# Patient Record
Sex: Female | Born: 1940 | Race: Black or African American | Hispanic: No | Marital: Single | Smoking: Former smoker
Health system: Southern US, Community
[De-identification: ages and names within clinical notes are randomized; demographics above are authoritative.]

## PROBLEM LIST (undated history)

## (undated) DIAGNOSIS — T4145XA Adverse effect of unspecified anesthetic, initial encounter: Secondary | ICD-10-CM

## (undated) DIAGNOSIS — D631 Anemia in chronic kidney disease: Secondary | ICD-10-CM

## (undated) DIAGNOSIS — K222 Esophageal obstruction: Secondary | ICD-10-CM

## (undated) DIAGNOSIS — R51 Headache: Secondary | ICD-10-CM

## (undated) DIAGNOSIS — E079 Disorder of thyroid, unspecified: Secondary | ICD-10-CM

## (undated) DIAGNOSIS — Z9989 Dependence on other enabling machines and devices: Secondary | ICD-10-CM

## (undated) DIAGNOSIS — I272 Pulmonary hypertension, unspecified: Secondary | ICD-10-CM

## (undated) DIAGNOSIS — T8859XA Other complications of anesthesia, initial encounter: Secondary | ICD-10-CM

## (undated) DIAGNOSIS — K2210 Ulcer of esophagus without bleeding: Secondary | ICD-10-CM

## (undated) DIAGNOSIS — G7000 Myasthenia gravis without (acute) exacerbation: Secondary | ICD-10-CM

## (undated) DIAGNOSIS — K5790 Diverticulosis of intestine, part unspecified, without perforation or abscess without bleeding: Secondary | ICD-10-CM

## (undated) DIAGNOSIS — R011 Cardiac murmur, unspecified: Secondary | ICD-10-CM

## (undated) DIAGNOSIS — N183 Chronic kidney disease, stage 3 (moderate): Secondary | ICD-10-CM

## (undated) DIAGNOSIS — E785 Hyperlipidemia, unspecified: Secondary | ICD-10-CM

## (undated) DIAGNOSIS — J42 Unspecified chronic bronchitis: Secondary | ICD-10-CM

## (undated) DIAGNOSIS — K219 Gastro-esophageal reflux disease without esophagitis: Secondary | ICD-10-CM

## (undated) DIAGNOSIS — J449 Chronic obstructive pulmonary disease, unspecified: Secondary | ICD-10-CM

## (undated) DIAGNOSIS — M7540 Impingement syndrome of unspecified shoulder: Secondary | ICD-10-CM

## (undated) DIAGNOSIS — F329 Major depressive disorder, single episode, unspecified: Secondary | ICD-10-CM

## (undated) DIAGNOSIS — F419 Anxiety disorder, unspecified: Secondary | ICD-10-CM

## (undated) DIAGNOSIS — I10 Essential (primary) hypertension: Secondary | ICD-10-CM

## (undated) DIAGNOSIS — N189 Chronic kidney disease, unspecified: Secondary | ICD-10-CM

## (undated) DIAGNOSIS — R0602 Shortness of breath: Secondary | ICD-10-CM

## (undated) DIAGNOSIS — G4733 Obstructive sleep apnea (adult) (pediatric): Secondary | ICD-10-CM

## (undated) DIAGNOSIS — M47816 Spondylosis without myelopathy or radiculopathy, lumbar region: Secondary | ICD-10-CM

## (undated) DIAGNOSIS — E119 Type 2 diabetes mellitus without complications: Secondary | ICD-10-CM

## (undated) DIAGNOSIS — M1990 Unspecified osteoarthritis, unspecified site: Secondary | ICD-10-CM

## (undated) DIAGNOSIS — I340 Nonrheumatic mitral (valve) insufficiency: Secondary | ICD-10-CM

## (undated) DIAGNOSIS — E669 Obesity, unspecified: Secondary | ICD-10-CM

## (undated) DIAGNOSIS — J189 Pneumonia, unspecified organism: Secondary | ICD-10-CM

## (undated) DIAGNOSIS — F32A Depression, unspecified: Secondary | ICD-10-CM

## (undated) DIAGNOSIS — G5600 Carpal tunnel syndrome, unspecified upper limb: Secondary | ICD-10-CM

## (undated) DIAGNOSIS — J302 Other seasonal allergic rhinitis: Secondary | ICD-10-CM

## (undated) DIAGNOSIS — D509 Iron deficiency anemia, unspecified: Secondary | ICD-10-CM

## (undated) DIAGNOSIS — M25519 Pain in unspecified shoulder: Secondary | ICD-10-CM

## (undated) DIAGNOSIS — Z720 Tobacco use: Secondary | ICD-10-CM

## (undated) HISTORY — DX: Myasthenia gravis without (acute) exacerbation: G70.00

## (undated) HISTORY — DX: Ulcer of esophagus without bleeding: K22.10

## (undated) HISTORY — DX: Gastro-esophageal reflux disease without esophagitis: K21.9

## (undated) HISTORY — DX: Chronic kidney disease, unspecified: N18.9

## (undated) HISTORY — PX: EYE SURGERY: SHX253

## (undated) HISTORY — DX: Obesity, unspecified: E66.9

## (undated) HISTORY — DX: Tobacco use: Z72.0

## (undated) HISTORY — DX: Unspecified osteoarthritis, unspecified site: M19.90

## (undated) HISTORY — DX: Essential (primary) hypertension: I10

## (undated) HISTORY — PX: TONSILLECTOMY: SUR1361

## (undated) HISTORY — PX: ESOPHAGEAL DILATION: SHX303

## (undated) HISTORY — DX: Iron deficiency anemia, unspecified: D50.9

## (undated) HISTORY — DX: Pain in unspecified shoulder: M25.519

## (undated) HISTORY — PX: ABDOMINAL HYSTERECTOMY: SHX81

## (undated) HISTORY — DX: Impingement syndrome of unspecified shoulder: M75.40

## (undated) HISTORY — DX: Major depressive disorder, single episode, unspecified: F32.9

## (undated) HISTORY — DX: Esophageal obstruction: K22.2

## (undated) HISTORY — DX: Depression, unspecified: F32.A

## (undated) HISTORY — DX: Chronic kidney disease, stage 3 (moderate): N18.3

## (undated) HISTORY — DX: Hyperlipidemia, unspecified: E78.5

## (undated) HISTORY — DX: Disorder of thyroid, unspecified: E07.9

## (undated) HISTORY — DX: Spondylosis without myelopathy or radiculopathy, lumbar region: M47.816

## (undated) HISTORY — DX: Anemia in chronic kidney disease: D63.1

## (undated) HISTORY — DX: Nonrheumatic mitral (valve) insufficiency: I34.0

## (undated) HISTORY — PX: CARPAL TUNNEL RELEASE: SHX101

## (undated) HISTORY — DX: Pulmonary hypertension, unspecified: I27.20

## (undated) HISTORY — PX: JOINT REPLACEMENT: SHX530

## (undated) HISTORY — DX: Carpal tunnel syndrome, unspecified upper limb: G56.00

## (undated) HISTORY — PX: CHOLECYSTECTOMY: SHX55

## (undated) HISTORY — PX: APPENDECTOMY: SHX54

## (undated) HISTORY — DX: Diverticulosis of intestine, part unspecified, without perforation or abscess without bleeding: K57.90

---

## 2001-07-29 ENCOUNTER — Encounter: Payer: Self-pay | Admitting: Unknown Physician Specialty

## 2001-07-29 ENCOUNTER — Ambulatory Visit (HOSPITAL_COMMUNITY): Admission: RE | Admit: 2001-07-29 | Discharge: 2001-07-29 | Payer: Self-pay | Admitting: Unknown Physician Specialty

## 2002-03-19 ENCOUNTER — Ambulatory Visit (HOSPITAL_COMMUNITY): Admission: RE | Admit: 2002-03-19 | Discharge: 2002-03-19 | Payer: Self-pay | Admitting: Unknown Physician Specialty

## 2003-09-14 ENCOUNTER — Ambulatory Visit (HOSPITAL_COMMUNITY): Admission: RE | Admit: 2003-09-14 | Discharge: 2003-09-14 | Payer: Self-pay | Admitting: Internal Medicine

## 2003-09-14 ENCOUNTER — Encounter: Payer: Self-pay | Admitting: Internal Medicine

## 2004-04-11 ENCOUNTER — Ambulatory Visit (HOSPITAL_COMMUNITY): Admission: RE | Admit: 2004-04-11 | Discharge: 2004-04-11 | Payer: Self-pay | Admitting: Family Medicine

## 2004-07-31 DIAGNOSIS — K5790 Diverticulosis of intestine, part unspecified, without perforation or abscess without bleeding: Secondary | ICD-10-CM

## 2004-07-31 HISTORY — DX: Diverticulosis of intestine, part unspecified, without perforation or abscess without bleeding: K57.90

## 2004-08-30 ENCOUNTER — Ambulatory Visit (HOSPITAL_COMMUNITY): Admission: RE | Admit: 2004-08-30 | Discharge: 2004-08-30 | Payer: Self-pay | Admitting: Internal Medicine

## 2004-11-03 ENCOUNTER — Ambulatory Visit: Payer: Self-pay | Admitting: *Deleted

## 2004-11-08 ENCOUNTER — Ambulatory Visit: Payer: Self-pay | Admitting: Cardiology

## 2004-11-09 ENCOUNTER — Encounter (HOSPITAL_COMMUNITY): Admission: RE | Admit: 2004-11-09 | Discharge: 2004-12-09 | Payer: Self-pay | Admitting: *Deleted

## 2004-11-09 ENCOUNTER — Ambulatory Visit: Payer: Self-pay | Admitting: *Deleted

## 2004-11-15 ENCOUNTER — Ambulatory Visit: Payer: Self-pay | Admitting: Family Medicine

## 2004-11-15 ENCOUNTER — Ambulatory Visit: Payer: Self-pay | Admitting: *Deleted

## 2004-12-06 ENCOUNTER — Ambulatory Visit: Payer: Self-pay | Admitting: Family Medicine

## 2005-01-08 ENCOUNTER — Ambulatory Visit: Payer: Self-pay | Admitting: Orthopedic Surgery

## 2005-01-24 ENCOUNTER — Ambulatory Visit: Payer: Self-pay | Admitting: Family Medicine

## 2005-02-19 ENCOUNTER — Ambulatory Visit: Payer: Self-pay | Admitting: Orthopedic Surgery

## 2005-02-22 ENCOUNTER — Ambulatory Visit (HOSPITAL_COMMUNITY): Admission: RE | Admit: 2005-02-22 | Discharge: 2005-02-22 | Payer: Self-pay | Admitting: Orthopedic Surgery

## 2005-02-22 ENCOUNTER — Encounter: Payer: Self-pay | Admitting: Orthopedic Surgery

## 2005-02-28 ENCOUNTER — Ambulatory Visit: Payer: Self-pay | Admitting: Orthopedic Surgery

## 2005-04-24 ENCOUNTER — Ambulatory Visit: Payer: Self-pay | Admitting: Family Medicine

## 2005-05-09 ENCOUNTER — Encounter: Admission: RE | Admit: 2005-05-09 | Discharge: 2005-05-09 | Payer: Self-pay | Admitting: Orthopedic Surgery

## 2005-05-22 ENCOUNTER — Ambulatory Visit: Payer: Self-pay | Admitting: Family Medicine

## 2005-05-30 ENCOUNTER — Ambulatory Visit: Payer: Self-pay | Admitting: Family Medicine

## 2005-05-31 ENCOUNTER — Encounter: Admission: RE | Admit: 2005-05-31 | Discharge: 2005-05-31 | Payer: Self-pay | Admitting: Orthopedic Surgery

## 2005-06-27 ENCOUNTER — Ambulatory Visit: Payer: Self-pay | Admitting: Family Medicine

## 2005-07-30 ENCOUNTER — Ambulatory Visit: Payer: Self-pay | Admitting: Family Medicine

## 2005-08-08 ENCOUNTER — Other Ambulatory Visit: Admission: RE | Admit: 2005-08-08 | Discharge: 2005-08-08 | Payer: Self-pay | Admitting: Obstetrics and Gynecology

## 2005-11-26 ENCOUNTER — Ambulatory Visit: Payer: Self-pay | Admitting: Family Medicine

## 2005-11-27 ENCOUNTER — Encounter (HOSPITAL_COMMUNITY): Admission: RE | Admit: 2005-11-27 | Discharge: 2005-12-27 | Payer: Self-pay | Admitting: *Deleted

## 2005-11-27 ENCOUNTER — Ambulatory Visit (HOSPITAL_COMMUNITY): Admission: RE | Admit: 2005-11-27 | Discharge: 2005-11-27 | Payer: Self-pay | Admitting: Family Medicine

## 2005-12-20 ENCOUNTER — Ambulatory Visit: Payer: Self-pay | Admitting: Family Medicine

## 2006-01-01 ENCOUNTER — Encounter (HOSPITAL_COMMUNITY): Admission: RE | Admit: 2006-01-01 | Discharge: 2006-01-31 | Payer: Self-pay | Admitting: Family Medicine

## 2006-01-07 ENCOUNTER — Ambulatory Visit: Payer: Self-pay | Admitting: Family Medicine

## 2006-02-20 ENCOUNTER — Ambulatory Visit: Payer: Self-pay | Admitting: Family Medicine

## 2006-04-03 ENCOUNTER — Ambulatory Visit: Payer: Self-pay | Admitting: Family Medicine

## 2006-04-26 ENCOUNTER — Ambulatory Visit: Payer: Self-pay | Admitting: Family Medicine

## 2006-05-02 ENCOUNTER — Ambulatory Visit: Payer: Self-pay | Admitting: Family Medicine

## 2006-05-09 ENCOUNTER — Ambulatory Visit: Payer: Self-pay | Admitting: *Deleted

## 2006-05-14 ENCOUNTER — Encounter: Payer: Self-pay | Admitting: Vascular Surgery

## 2006-05-14 ENCOUNTER — Ambulatory Visit (HOSPITAL_COMMUNITY): Admission: RE | Admit: 2006-05-14 | Discharge: 2006-05-14 | Payer: Self-pay | Admitting: Ophthalmology

## 2006-06-10 ENCOUNTER — Ambulatory Visit: Payer: Self-pay | Admitting: Family Medicine

## 2006-08-01 ENCOUNTER — Ambulatory Visit: Payer: Self-pay | Admitting: Family Medicine

## 2006-10-01 ENCOUNTER — Ambulatory Visit: Payer: Self-pay | Admitting: Family Medicine

## 2006-10-21 ENCOUNTER — Ambulatory Visit: Payer: Self-pay | Admitting: Internal Medicine

## 2006-10-31 ENCOUNTER — Ambulatory Visit: Payer: Self-pay | Admitting: Internal Medicine

## 2006-10-31 ENCOUNTER — Ambulatory Visit (HOSPITAL_COMMUNITY): Admission: RE | Admit: 2006-10-31 | Discharge: 2006-10-31 | Payer: Self-pay | Admitting: Internal Medicine

## 2006-11-05 ENCOUNTER — Ambulatory Visit: Payer: Self-pay | Admitting: Family Medicine

## 2006-12-10 ENCOUNTER — Ambulatory Visit (HOSPITAL_COMMUNITY): Admission: RE | Admit: 2006-12-10 | Discharge: 2006-12-10 | Payer: Self-pay | Admitting: Family Medicine

## 2006-12-12 ENCOUNTER — Ambulatory Visit: Payer: Self-pay | Admitting: Internal Medicine

## 2006-12-16 ENCOUNTER — Encounter (HOSPITAL_COMMUNITY): Admission: RE | Admit: 2006-12-16 | Discharge: 2006-12-30 | Payer: Self-pay | Admitting: Internal Medicine

## 2007-01-07 ENCOUNTER — Ambulatory Visit: Payer: Self-pay | Admitting: Family Medicine

## 2007-01-08 ENCOUNTER — Encounter: Payer: Self-pay | Admitting: Orthopedic Surgery

## 2007-01-15 ENCOUNTER — Ambulatory Visit: Payer: Self-pay | Admitting: Family Medicine

## 2007-02-28 ENCOUNTER — Encounter: Payer: Self-pay | Admitting: Family Medicine

## 2007-02-28 LAB — CONVERTED CEMR LAB
Eosinophils Absolute: 0.2 10*3/uL (ref 0.0–0.7)
Ferritin: 7 ng/mL — ABNORMAL LOW (ref 10–291)
Folate: 9.6 ng/mL
Hgb A1c MFr Bld: 7.5 % — ABNORMAL HIGH (ref 4.6–6.1)
Lymphocytes Relative: 29 % (ref 12–46)
Lymphs Abs: 3 10*3/uL (ref 0.7–3.3)
MCV: 91.6 fL (ref 78.0–100.0)
Microalb, Ur: 0.92 mg/dL (ref 0.00–1.89)
Neutro Abs: 6.5 10*3/uL (ref 1.7–7.7)
Neutrophils Relative %: 63 % (ref 43–77)
Platelets: 378 10*3/uL (ref 150–400)
Saturation Ratios: 10 % — ABNORMAL LOW (ref 20–55)
TSH: 2.471 microintl units/mL (ref 0.350–5.50)
Vitamin B-12: 544 pg/mL (ref 211–911)
WBC: 10.4 10*3/uL (ref 4.0–10.5)

## 2007-03-07 ENCOUNTER — Emergency Department (HOSPITAL_COMMUNITY): Admission: EM | Admit: 2007-03-07 | Discharge: 2007-03-07 | Payer: Self-pay | Admitting: Emergency Medicine

## 2007-03-26 ENCOUNTER — Ambulatory Visit: Payer: Self-pay | Admitting: Family Medicine

## 2007-03-26 ENCOUNTER — Other Ambulatory Visit: Admission: RE | Admit: 2007-03-26 | Discharge: 2007-03-26 | Payer: Self-pay | Admitting: Family Medicine

## 2007-03-26 ENCOUNTER — Encounter (INDEPENDENT_AMBULATORY_CARE_PROVIDER_SITE_OTHER): Payer: Self-pay | Admitting: *Deleted

## 2007-04-07 ENCOUNTER — Ambulatory Visit: Payer: Self-pay | Admitting: Orthopedic Surgery

## 2007-04-09 ENCOUNTER — Ambulatory Visit (HOSPITAL_COMMUNITY): Admission: RE | Admit: 2007-04-09 | Discharge: 2007-04-09 | Payer: Self-pay | Admitting: Orthopedic Surgery

## 2007-04-16 ENCOUNTER — Ambulatory Visit: Payer: Self-pay | Admitting: Orthopedic Surgery

## 2007-05-06 ENCOUNTER — Ambulatory Visit: Payer: Self-pay | Admitting: Orthopedic Surgery

## 2007-05-07 ENCOUNTER — Ambulatory Visit: Payer: Self-pay | Admitting: Family Medicine

## 2007-05-13 ENCOUNTER — Encounter (INDEPENDENT_AMBULATORY_CARE_PROVIDER_SITE_OTHER): Payer: Self-pay | Admitting: Specialist

## 2007-05-13 ENCOUNTER — Ambulatory Visit: Payer: Self-pay | Admitting: Orthopedic Surgery

## 2007-05-13 ENCOUNTER — Inpatient Hospital Stay (HOSPITAL_COMMUNITY): Admission: RE | Admit: 2007-05-13 | Discharge: 2007-05-17 | Payer: Self-pay | Admitting: Orthopedic Surgery

## 2007-05-13 HISTORY — PX: TOTAL KNEE ARTHROPLASTY: SHX125

## 2007-05-27 ENCOUNTER — Ambulatory Visit: Payer: Self-pay | Admitting: Orthopedic Surgery

## 2007-05-30 ENCOUNTER — Ambulatory Visit (HOSPITAL_COMMUNITY): Admission: RE | Admit: 2007-05-30 | Discharge: 2007-05-30 | Payer: Self-pay | Admitting: Orthopaedic Surgery

## 2007-06-11 ENCOUNTER — Ambulatory Visit: Payer: Self-pay | Admitting: Orthopedic Surgery

## 2007-06-18 ENCOUNTER — Encounter (HOSPITAL_COMMUNITY): Admission: RE | Admit: 2007-06-18 | Discharge: 2007-07-18 | Payer: Self-pay | Admitting: Orthopedic Surgery

## 2007-06-24 ENCOUNTER — Ambulatory Visit: Payer: Self-pay | Admitting: Family Medicine

## 2007-07-03 ENCOUNTER — Ambulatory Visit: Payer: Self-pay | Admitting: Orthopedic Surgery

## 2007-07-03 ENCOUNTER — Encounter: Payer: Self-pay | Admitting: Family Medicine

## 2007-07-03 LAB — CONVERTED CEMR LAB
ALT: 13 units/L (ref 0–35)
AST: 16 units/L (ref 0–37)
Albumin: 4 g/dL (ref 3.5–5.2)
Cholesterol: 166 mg/dL (ref 0–200)
Eosinophils Relative: 3 % (ref 0–5)
Ferritin: 23 ng/mL (ref 10–291)
HCT: 31.3 % — ABNORMAL LOW (ref 36.0–46.0)
HDL: 54 mg/dL (ref >39)
Lymphocytes Relative: 34 % (ref 12–46)
Lymphs Abs: 3.3 10*3/uL (ref 0.7–3.3)
Neutrophils Relative %: 54 % (ref 43–77)
Platelets: 444 10*3/uL — ABNORMAL HIGH (ref 150–400)
Retic Count, Absolute: 67 (ref 19.0–186.0)
Saturation Ratios: 10 % — ABNORMAL LOW (ref 20–55)
Sed Rate: 16 mm/hr (ref 0–22)
TSH: 7.81 microintl units/mL — ABNORMAL HIGH (ref 0.350–5.50)
Total CHOL/HDL Ratio: 3.1
Total Protein: 6.9 g/dL (ref 6.0–8.3)
WBC: 9.7 10*3/uL (ref 4.0–10.5)

## 2007-07-07 ENCOUNTER — Encounter: Payer: Self-pay | Admitting: Family Medicine

## 2007-07-10 ENCOUNTER — Ambulatory Visit: Payer: Self-pay | Admitting: Orthopedic Surgery

## 2007-07-22 ENCOUNTER — Encounter (HOSPITAL_COMMUNITY): Admission: RE | Admit: 2007-07-22 | Discharge: 2007-08-21 | Payer: Self-pay | Admitting: Orthopedic Surgery

## 2007-08-21 ENCOUNTER — Ambulatory Visit: Payer: Self-pay | Admitting: Orthopedic Surgery

## 2007-09-04 ENCOUNTER — Encounter: Admission: RE | Admit: 2007-09-04 | Discharge: 2007-09-04 | Payer: Self-pay | Admitting: Orthopedic Surgery

## 2007-09-09 DIAGNOSIS — Z794 Long term (current) use of insulin: Secondary | ICD-10-CM

## 2007-09-09 DIAGNOSIS — E119 Type 2 diabetes mellitus without complications: Secondary | ICD-10-CM

## 2007-09-16 ENCOUNTER — Encounter: Admission: RE | Admit: 2007-09-16 | Discharge: 2007-09-16 | Payer: Self-pay | Admitting: Orthopedic Surgery

## 2007-09-24 ENCOUNTER — Ambulatory Visit: Payer: Self-pay | Admitting: Family Medicine

## 2007-10-07 ENCOUNTER — Encounter: Payer: Self-pay | Admitting: Family Medicine

## 2007-10-07 LAB — CONVERTED CEMR LAB
Basophils Absolute: 0 10*3/uL (ref 0.0–0.1)
Basophils Relative: 0 % (ref 0–1)
Bilirubin, Direct: 0.1 mg/dL (ref 0.0–0.3)
Chloride: 111 meq/L (ref 96–112)
Cholesterol: 171 mg/dL (ref 0–200)
Creatinine, Ser: 0.83 mg/dL (ref 0.40–1.20)
Folate: 5.6 ng/mL
HDL: 67 mg/dL (ref >39)
Hemoglobin: 9.8 g/dL — ABNORMAL LOW (ref 12.0–15.0)
Indirect Bilirubin: 0.2 mg/dL (ref 0.0–0.9)
MCHC: 30.3 g/dL (ref 30.0–36.0)
Monocytes Absolute: 0.7 10*3/uL (ref 0.2–0.7)
Neutro Abs: 7 10*3/uL (ref 1.7–7.7)
Neutrophils Relative %: 70 % (ref 43–77)
RDW: 17 % — ABNORMAL HIGH (ref 11.5–14.0)
TIBC: 337 ug/dL (ref 250–470)
Total Bilirubin: 0.3 mg/dL (ref 0.3–1.2)
Triglycerides: 110 mg/dL (ref <150)
UIBC: 315 ug/dL
VLDL: 22 mg/dL (ref 0–40)
Vitamin B-12: 317 pg/mL (ref 211–911)

## 2007-11-05 ENCOUNTER — Ambulatory Visit: Payer: Self-pay | Admitting: Family Medicine

## 2007-11-24 ENCOUNTER — Ambulatory Visit: Payer: Self-pay | Admitting: Orthopedic Surgery

## 2007-11-24 DIAGNOSIS — M1710 Unilateral primary osteoarthritis, unspecified knee: Secondary | ICD-10-CM

## 2007-11-24 DIAGNOSIS — IMO0002 Reserved for concepts with insufficient information to code with codable children: Secondary | ICD-10-CM | POA: Insufficient documentation

## 2007-12-23 ENCOUNTER — Ambulatory Visit (HOSPITAL_COMMUNITY): Admission: RE | Admit: 2007-12-23 | Discharge: 2007-12-23 | Payer: Self-pay | Admitting: Family Medicine

## 2007-12-29 ENCOUNTER — Ambulatory Visit: Payer: Self-pay | Admitting: Family Medicine

## 2007-12-29 LAB — CONVERTED CEMR LAB
Free T4: 1.13 ng/dL (ref 0.89–1.80)
TSH: 4.064 microintl units/mL (ref 0.350–5.50)

## 2008-01-01 DIAGNOSIS — K2210 Ulcer of esophagus without bleeding: Secondary | ICD-10-CM

## 2008-01-01 HISTORY — DX: Ulcer of esophagus without bleeding: K22.10

## 2008-02-02 ENCOUNTER — Ambulatory Visit: Payer: Self-pay | Admitting: Orthopedic Surgery

## 2008-02-02 DIAGNOSIS — M25519 Pain in unspecified shoulder: Secondary | ICD-10-CM

## 2008-02-02 DIAGNOSIS — M7580 Other shoulder lesions, unspecified shoulder: Secondary | ICD-10-CM

## 2008-02-10 ENCOUNTER — Encounter: Payer: Self-pay | Admitting: Family Medicine

## 2008-03-17 ENCOUNTER — Ambulatory Visit: Payer: Self-pay | Admitting: Orthopedic Surgery

## 2008-03-29 ENCOUNTER — Ambulatory Visit: Payer: Self-pay | Admitting: Family Medicine

## 2008-03-29 ENCOUNTER — Encounter: Payer: Self-pay | Admitting: Orthopedic Surgery

## 2008-04-01 ENCOUNTER — Encounter: Payer: Self-pay | Admitting: Family Medicine

## 2008-04-01 LAB — CONVERTED CEMR LAB: Retic Ct Pct: 1.5 % (ref 0.4–3.1)

## 2008-04-07 ENCOUNTER — Ambulatory Visit: Payer: Self-pay | Admitting: Family Medicine

## 2008-04-08 ENCOUNTER — Encounter: Payer: Self-pay | Admitting: Family Medicine

## 2008-04-14 ENCOUNTER — Ambulatory Visit: Payer: Self-pay | Admitting: Orthopedic Surgery

## 2008-05-19 ENCOUNTER — Ambulatory Visit: Payer: Self-pay | Admitting: Orthopedic Surgery

## 2008-05-19 DIAGNOSIS — Z96659 Presence of unspecified artificial knee joint: Secondary | ICD-10-CM

## 2008-05-19 DIAGNOSIS — G5600 Carpal tunnel syndrome, unspecified upper limb: Secondary | ICD-10-CM

## 2008-06-22 ENCOUNTER — Ambulatory Visit: Payer: Self-pay | Admitting: Family Medicine

## 2008-06-22 LAB — CONVERTED CEMR LAB
BUN: 22 mg/dL (ref 6–23)
CO2: 30 meq/L (ref 19–32)
Chloride: 98 meq/L (ref 96–112)
Glucose, Bld: 94 mg/dL (ref 70–99)
Potassium: 4.1 meq/L (ref 3.5–5.3)
Sodium: 140 meq/L (ref 135–145)

## 2008-06-24 ENCOUNTER — Ambulatory Visit: Payer: Self-pay | Admitting: Orthopedic Surgery

## 2008-06-30 ENCOUNTER — Encounter: Payer: Self-pay | Admitting: Family Medicine

## 2008-06-30 DIAGNOSIS — G4733 Obstructive sleep apnea (adult) (pediatric): Secondary | ICD-10-CM

## 2008-06-30 DIAGNOSIS — E785 Hyperlipidemia, unspecified: Secondary | ICD-10-CM

## 2008-06-30 DIAGNOSIS — F418 Other specified anxiety disorders: Secondary | ICD-10-CM

## 2008-06-30 DIAGNOSIS — K219 Gastro-esophageal reflux disease without esophagitis: Secondary | ICD-10-CM

## 2008-06-30 DIAGNOSIS — R609 Edema, unspecified: Secondary | ICD-10-CM | POA: Insufficient documentation

## 2008-07-29 ENCOUNTER — Telehealth: Payer: Self-pay | Admitting: Family Medicine

## 2008-08-02 ENCOUNTER — Encounter: Payer: Self-pay | Admitting: Family Medicine

## 2008-08-02 ENCOUNTER — Ambulatory Visit: Payer: Self-pay | Admitting: Family Medicine

## 2008-08-02 LAB — CONVERTED CEMR LAB: Hgb A1c MFr Bld: 9 %

## 2008-08-03 ENCOUNTER — Ambulatory Visit: Payer: Self-pay | Admitting: Family Medicine

## 2008-08-03 ENCOUNTER — Telehealth: Payer: Self-pay | Admitting: Family Medicine

## 2008-08-04 ENCOUNTER — Ambulatory Visit: Payer: Self-pay | Admitting: Family Medicine

## 2008-08-06 ENCOUNTER — Encounter: Payer: Self-pay | Admitting: Family Medicine

## 2008-08-06 ENCOUNTER — Ambulatory Visit (HOSPITAL_COMMUNITY): Admission: RE | Admit: 2008-08-06 | Discharge: 2008-08-06 | Payer: Self-pay | Admitting: Family Medicine

## 2008-08-06 ENCOUNTER — Ambulatory Visit: Payer: Self-pay | Admitting: Internal Medicine

## 2008-08-09 ENCOUNTER — Encounter: Payer: Self-pay | Admitting: Family Medicine

## 2008-08-12 ENCOUNTER — Telehealth: Payer: Self-pay | Admitting: Orthopedic Surgery

## 2008-08-12 ENCOUNTER — Ambulatory Visit: Payer: Self-pay | Admitting: Family Medicine

## 2008-08-12 LAB — CONVERTED CEMR LAB
Chloride: 104 meq/L (ref 96–112)
Creatinine, Ser: 1.09 mg/dL (ref 0.40–1.20)
Glucose, Bld: 117 mg/dL
Potassium: 4.4 meq/L (ref 3.5–5.3)
Sodium: 142 meq/L (ref 135–145)

## 2008-08-17 ENCOUNTER — Encounter: Payer: Self-pay | Admitting: Family Medicine

## 2008-08-31 ENCOUNTER — Encounter: Payer: Self-pay | Admitting: Family Medicine

## 2008-08-31 ENCOUNTER — Ambulatory Visit: Payer: Self-pay | Admitting: Cardiology

## 2008-08-31 ENCOUNTER — Ambulatory Visit (HOSPITAL_COMMUNITY): Admission: RE | Admit: 2008-08-31 | Discharge: 2008-08-31 | Payer: Self-pay | Admitting: Cardiology

## 2008-09-09 ENCOUNTER — Telehealth: Payer: Self-pay | Admitting: Family Medicine

## 2008-09-17 ENCOUNTER — Encounter: Payer: Self-pay | Admitting: Family Medicine

## 2008-09-21 ENCOUNTER — Encounter (INDEPENDENT_AMBULATORY_CARE_PROVIDER_SITE_OTHER): Payer: Self-pay | Admitting: *Deleted

## 2008-09-21 LAB — CONVERTED CEMR LAB
ALT: 15 units/L
AST: 15 units/L
Albumin: 4.1 g/dL
BUN: 27 mg/dL
CO2: 27 meq/L
Glucose, Bld: 62 mg/dL
Potassium: 4.3 meq/L
TSH: 3.49 microintl units/mL
Total Protein: 7.3 g/dL
Triglycerides: 102 mg/dL

## 2008-09-27 ENCOUNTER — Ambulatory Visit: Payer: Self-pay | Admitting: Family Medicine

## 2008-09-27 DIAGNOSIS — E039 Hypothyroidism, unspecified: Secondary | ICD-10-CM

## 2008-10-02 DIAGNOSIS — D509 Iron deficiency anemia, unspecified: Secondary | ICD-10-CM

## 2008-10-07 ENCOUNTER — Encounter: Payer: Self-pay | Admitting: Family Medicine

## 2008-10-08 ENCOUNTER — Encounter: Payer: Self-pay | Admitting: Family Medicine

## 2008-10-14 ENCOUNTER — Ambulatory Visit: Payer: Self-pay | Admitting: Internal Medicine

## 2008-10-26 ENCOUNTER — Ambulatory Visit (HOSPITAL_COMMUNITY): Admission: RE | Admit: 2008-10-26 | Discharge: 2008-10-26 | Payer: Self-pay | Admitting: Internal Medicine

## 2008-10-26 ENCOUNTER — Ambulatory Visit: Payer: Self-pay | Admitting: Internal Medicine

## 2008-11-01 ENCOUNTER — Encounter: Payer: Self-pay | Admitting: Family Medicine

## 2008-11-04 ENCOUNTER — Encounter: Payer: Self-pay | Admitting: Family Medicine

## 2008-11-05 ENCOUNTER — Encounter: Payer: Self-pay | Admitting: Family Medicine

## 2008-11-17 ENCOUNTER — Encounter: Payer: Self-pay | Admitting: Family Medicine

## 2008-11-24 ENCOUNTER — Ambulatory Visit: Payer: Self-pay | Admitting: Orthopedic Surgery

## 2008-11-24 DIAGNOSIS — M25569 Pain in unspecified knee: Secondary | ICD-10-CM

## 2008-12-03 ENCOUNTER — Encounter: Payer: Self-pay | Admitting: Family Medicine

## 2008-12-06 ENCOUNTER — Ambulatory Visit: Payer: Self-pay | Admitting: Family Medicine

## 2008-12-06 LAB — CONVERTED CEMR LAB
Glucose, Bld: 76 mg/dL
Hgb A1c MFr Bld: 6.6 %

## 2008-12-21 ENCOUNTER — Ambulatory Visit (HOSPITAL_COMMUNITY): Admission: RE | Admit: 2008-12-21 | Discharge: 2008-12-21 | Payer: Self-pay | Admitting: Family Medicine

## 2009-01-04 ENCOUNTER — Encounter: Payer: Self-pay | Admitting: Family Medicine

## 2009-01-10 ENCOUNTER — Encounter: Payer: Self-pay | Admitting: Family Medicine

## 2009-01-24 ENCOUNTER — Ambulatory Visit: Payer: Self-pay | Admitting: Orthopedic Surgery

## 2009-01-24 DIAGNOSIS — D492 Neoplasm of unspecified behavior of bone, soft tissue, and skin: Secondary | ICD-10-CM

## 2009-01-27 ENCOUNTER — Ambulatory Visit: Payer: Self-pay | Admitting: Orthopedic Surgery

## 2009-01-28 ENCOUNTER — Encounter: Payer: Self-pay | Admitting: Family Medicine

## 2009-01-31 ENCOUNTER — Telehealth: Payer: Self-pay | Admitting: Orthopedic Surgery

## 2009-02-01 ENCOUNTER — Ambulatory Visit: Payer: Self-pay | Admitting: Orthopedic Surgery

## 2009-02-01 ENCOUNTER — Encounter: Payer: Self-pay | Admitting: Family Medicine

## 2009-02-01 ENCOUNTER — Ambulatory Visit (HOSPITAL_COMMUNITY): Admission: RE | Admit: 2009-02-01 | Discharge: 2009-02-01 | Payer: Self-pay | Admitting: Orthopedic Surgery

## 2009-02-01 ENCOUNTER — Encounter: Payer: Self-pay | Admitting: Orthopedic Surgery

## 2009-02-03 ENCOUNTER — Ambulatory Visit: Payer: Self-pay | Admitting: Family Medicine

## 2009-02-03 ENCOUNTER — Ambulatory Visit: Payer: Self-pay | Admitting: Orthopedic Surgery

## 2009-02-03 LAB — CONVERTED CEMR LAB: Glucose, Bld: 97 mg/dL

## 2009-02-07 ENCOUNTER — Encounter: Payer: Self-pay | Admitting: Family Medicine

## 2009-02-07 LAB — CONVERTED CEMR LAB
ALT: 15 units/L (ref 0–35)
AST: 11 units/L (ref 0–37)
Alkaline Phosphatase: 111 units/L (ref 39–117)
Bilirubin, Direct: 0.1 mg/dL (ref 0.0–0.3)
Cholesterol: 163 mg/dL (ref 0–200)
HDL: 72 mg/dL (ref >39)
Total Bilirubin: 0.2 mg/dL — ABNORMAL LOW (ref 0.3–1.2)

## 2009-02-08 ENCOUNTER — Encounter: Payer: Self-pay | Admitting: Family Medicine

## 2009-02-08 LAB — CONVERTED CEMR LAB: OCCULT 3: NEGATIVE

## 2009-02-09 ENCOUNTER — Encounter: Payer: Self-pay | Admitting: Family Medicine

## 2009-02-09 LAB — CONVERTED CEMR LAB: OCCULT 2: NEGATIVE

## 2009-02-15 ENCOUNTER — Ambulatory Visit: Payer: Self-pay | Admitting: Orthopedic Surgery

## 2009-02-17 ENCOUNTER — Encounter: Payer: Self-pay | Admitting: Family Medicine

## 2009-02-22 ENCOUNTER — Encounter: Payer: Self-pay | Admitting: Family Medicine

## 2009-03-02 ENCOUNTER — Ambulatory Visit: Payer: Self-pay | Admitting: Orthopedic Surgery

## 2009-03-03 ENCOUNTER — Ambulatory Visit: Payer: Self-pay | Admitting: Family Medicine

## 2009-03-03 LAB — CONVERTED CEMR LAB: Glucose, Bld: 175 mg/dL

## 2009-03-06 DIAGNOSIS — E669 Obesity, unspecified: Secondary | ICD-10-CM | POA: Insufficient documentation

## 2009-03-15 ENCOUNTER — Encounter: Payer: Self-pay | Admitting: Family Medicine

## 2009-03-31 ENCOUNTER — Encounter: Payer: Self-pay | Admitting: Family Medicine

## 2009-04-19 ENCOUNTER — Encounter: Payer: Self-pay | Admitting: Family Medicine

## 2009-04-19 LAB — CONVERTED CEMR LAB: Microalb, Ur: 3.4 mg/dL — ABNORMAL HIGH (ref 0.00–1.89)

## 2009-05-04 ENCOUNTER — Ambulatory Visit: Payer: Self-pay | Admitting: Orthopedic Surgery

## 2009-05-09 ENCOUNTER — Encounter: Payer: Self-pay | Admitting: Family Medicine

## 2009-06-06 ENCOUNTER — Encounter: Payer: Self-pay | Admitting: Family Medicine

## 2009-06-08 ENCOUNTER — Encounter: Payer: Self-pay | Admitting: Family Medicine

## 2009-06-09 ENCOUNTER — Ambulatory Visit: Payer: Self-pay | Admitting: Family Medicine

## 2009-06-09 DIAGNOSIS — R32 Unspecified urinary incontinence: Secondary | ICD-10-CM

## 2009-06-09 DIAGNOSIS — M25559 Pain in unspecified hip: Secondary | ICD-10-CM | POA: Insufficient documentation

## 2009-06-09 LAB — CONVERTED CEMR LAB
Blood in Urine, dipstick: NEGATIVE
Nitrite: NEGATIVE
Protein, U semiquant: 30
Urobilinogen, UA: 0.2

## 2009-06-10 ENCOUNTER — Telehealth: Payer: Self-pay | Admitting: Family Medicine

## 2009-06-11 ENCOUNTER — Encounter: Payer: Self-pay | Admitting: Family Medicine

## 2009-06-13 ENCOUNTER — Telehealth: Payer: Self-pay | Admitting: Family Medicine

## 2009-06-29 ENCOUNTER — Encounter: Payer: Self-pay | Admitting: Family Medicine

## 2009-07-01 ENCOUNTER — Encounter: Payer: Self-pay | Admitting: Family Medicine

## 2009-07-06 ENCOUNTER — Encounter: Payer: Self-pay | Admitting: Family Medicine

## 2009-07-11 ENCOUNTER — Encounter: Payer: Self-pay | Admitting: Family Medicine

## 2009-07-19 ENCOUNTER — Encounter: Payer: Self-pay | Admitting: Family Medicine

## 2009-08-01 ENCOUNTER — Encounter: Payer: Self-pay | Admitting: Family Medicine

## 2009-08-15 ENCOUNTER — Telehealth: Payer: Self-pay | Admitting: Family Medicine

## 2009-08-19 ENCOUNTER — Encounter: Payer: Self-pay | Admitting: Family Medicine

## 2009-08-26 ENCOUNTER — Encounter: Payer: Self-pay | Admitting: Family Medicine

## 2009-08-26 ENCOUNTER — Other Ambulatory Visit: Payer: Self-pay | Admitting: Urology

## 2009-08-26 ENCOUNTER — Observation Stay (HOSPITAL_COMMUNITY): Admission: AD | Admit: 2009-08-26 | Discharge: 2009-08-27 | Payer: Self-pay | Admitting: Urology

## 2009-08-26 HISTORY — PX: INCONTINENCE SURGERY: SHX676

## 2009-08-27 ENCOUNTER — Other Ambulatory Visit: Payer: Self-pay | Admitting: Urology

## 2009-09-02 ENCOUNTER — Encounter: Payer: Self-pay | Admitting: Family Medicine

## 2009-09-09 ENCOUNTER — Encounter: Payer: Self-pay | Admitting: Family Medicine

## 2009-09-14 ENCOUNTER — Ambulatory Visit: Payer: Self-pay | Admitting: Family Medicine

## 2009-09-14 DIAGNOSIS — E1122 Type 2 diabetes mellitus with diabetic chronic kidney disease: Secondary | ICD-10-CM | POA: Insufficient documentation

## 2009-09-14 DIAGNOSIS — N183 Chronic kidney disease, stage 3 (moderate): Secondary | ICD-10-CM

## 2009-09-14 DIAGNOSIS — N1832 Chronic kidney disease, stage 3b: Secondary | ICD-10-CM

## 2009-09-14 HISTORY — DX: Chronic kidney disease, stage 3b: N18.32

## 2009-09-26 LAB — CONVERTED CEMR LAB
BUN: 27 mg/dL — ABNORMAL HIGH (ref 6–23)
Bilirubin, Direct: <0.1 mg/dL (ref 0.0–0.3)
CO2: 27 meq/L (ref 19–32)
Chloride: 102 meq/L (ref 96–112)
Glucose, Bld: 62 mg/dL — ABNORMAL LOW (ref 70–99)
LDL Cholesterol: 88 mg/dL (ref 0–99)
Potassium: 4.3 meq/L (ref 3.5–5.3)
VLDL: 20 mg/dL (ref 0–40)

## 2009-09-29 ENCOUNTER — Encounter: Payer: Self-pay | Admitting: Family Medicine

## 2009-10-05 ENCOUNTER — Encounter: Payer: Self-pay | Admitting: Family Medicine

## 2009-10-05 ENCOUNTER — Telehealth: Payer: Self-pay | Admitting: Family Medicine

## 2009-10-10 ENCOUNTER — Encounter: Payer: Self-pay | Admitting: Family Medicine

## 2009-10-10 ENCOUNTER — Encounter (INDEPENDENT_AMBULATORY_CARE_PROVIDER_SITE_OTHER): Payer: Self-pay | Admitting: *Deleted

## 2009-10-10 LAB — CONVERTED CEMR LAB
ALT: 11 units/L
Albumin: 3.8 g/dL
Alkaline Phosphatase: 100 units/L
BUN: 24 mg/dL
CO2: 23 meq/L
Chloride: 106 meq/L
Creatinine, Ser: 1.46 mg/dL
Potassium: 4.2 meq/L
Total Protein: 6.4 g/dL

## 2009-11-02 ENCOUNTER — Encounter (INDEPENDENT_AMBULATORY_CARE_PROVIDER_SITE_OTHER): Payer: Self-pay | Admitting: *Deleted

## 2009-11-02 ENCOUNTER — Ambulatory Visit: Payer: Self-pay | Admitting: Family Medicine

## 2009-11-02 ENCOUNTER — Ambulatory Visit: Payer: Self-pay | Admitting: Cardiology

## 2009-11-04 ENCOUNTER — Telehealth (INDEPENDENT_AMBULATORY_CARE_PROVIDER_SITE_OTHER): Payer: Self-pay | Admitting: *Deleted

## 2009-11-08 ENCOUNTER — Encounter: Payer: Self-pay | Admitting: Cardiology

## 2009-11-08 ENCOUNTER — Ambulatory Visit (HOSPITAL_COMMUNITY): Admission: RE | Admit: 2009-11-08 | Discharge: 2009-11-08 | Payer: Self-pay | Admitting: Cardiology

## 2009-11-08 ENCOUNTER — Ambulatory Visit: Payer: Self-pay | Admitting: Cardiology

## 2009-11-16 ENCOUNTER — Ambulatory Visit: Payer: Self-pay | Admitting: Family Medicine

## 2009-11-16 LAB — CONVERTED CEMR LAB: Glucose, Bld: 114 mg/dL

## 2009-11-17 ENCOUNTER — Telehealth: Payer: Self-pay | Admitting: Family Medicine

## 2009-11-18 ENCOUNTER — Ambulatory Visit: Payer: Self-pay | Admitting: Family Medicine

## 2009-11-18 LAB — CONVERTED CEMR LAB
CO2: 30 meq/L (ref 19–32)
Calcium: 9.6 mg/dL (ref 8.4–10.5)
Chloride: 101 meq/L (ref 96–112)
Sodium: 139 meq/L (ref 135–145)

## 2009-11-20 ENCOUNTER — Telehealth: Payer: Self-pay | Admitting: Family Medicine

## 2009-11-21 ENCOUNTER — Encounter: Payer: Self-pay | Admitting: Family Medicine

## 2009-11-22 ENCOUNTER — Encounter: Payer: Self-pay | Admitting: Family Medicine

## 2009-11-30 ENCOUNTER — Encounter (INDEPENDENT_AMBULATORY_CARE_PROVIDER_SITE_OTHER): Payer: Self-pay | Admitting: *Deleted

## 2009-12-01 ENCOUNTER — Telehealth: Payer: Self-pay | Admitting: Family Medicine

## 2009-12-01 ENCOUNTER — Encounter (INDEPENDENT_AMBULATORY_CARE_PROVIDER_SITE_OTHER): Payer: Self-pay | Admitting: *Deleted

## 2009-12-01 ENCOUNTER — Ambulatory Visit: Payer: Self-pay | Admitting: Cardiology

## 2009-12-01 DIAGNOSIS — D509 Iron deficiency anemia, unspecified: Secondary | ICD-10-CM

## 2009-12-19 ENCOUNTER — Ambulatory Visit: Payer: Self-pay | Admitting: Family Medicine

## 2009-12-19 LAB — CONVERTED CEMR LAB
Glucose, Bld: 164 mg/dL
Hgb A1c MFr Bld: 6.1 %

## 2009-12-27 ENCOUNTER — Encounter: Payer: Self-pay | Admitting: Family Medicine

## 2010-01-19 ENCOUNTER — Encounter: Payer: Self-pay | Admitting: Family Medicine

## 2010-02-16 ENCOUNTER — Ambulatory Visit (HOSPITAL_COMMUNITY): Admission: RE | Admit: 2010-02-16 | Discharge: 2010-02-16 | Payer: Self-pay | Admitting: Family Medicine

## 2010-02-16 ENCOUNTER — Encounter: Payer: Self-pay | Admitting: Family Medicine

## 2010-02-21 ENCOUNTER — Encounter: Payer: Self-pay | Admitting: Family Medicine

## 2010-03-06 ENCOUNTER — Telehealth: Payer: Self-pay | Admitting: Family Medicine

## 2010-03-14 ENCOUNTER — Encounter: Payer: Self-pay | Admitting: Family Medicine

## 2010-03-23 ENCOUNTER — Ambulatory Visit: Payer: Self-pay | Admitting: Family Medicine

## 2010-03-27 ENCOUNTER — Telehealth: Payer: Self-pay | Admitting: Family Medicine

## 2010-03-27 DIAGNOSIS — R5381 Other malaise: Secondary | ICD-10-CM

## 2010-03-27 DIAGNOSIS — R5383 Other fatigue: Secondary | ICD-10-CM

## 2010-04-05 ENCOUNTER — Encounter: Payer: Self-pay | Admitting: Family Medicine

## 2010-04-06 LAB — CONVERTED CEMR LAB
AST: 13 units/L (ref 0–37)
Alkaline Phosphatase: 107 units/L (ref 39–117)
Bilirubin, Direct: 0.1 mg/dL (ref 0.0–0.3)
HDL: 56 mg/dL (ref >39)
Indirect Bilirubin: 0.2 mg/dL (ref 0.0–0.9)
LDL Cholesterol: 160 mg/dL — ABNORMAL HIGH (ref 0–99)
Total Bilirubin: 0.3 mg/dL (ref 0.3–1.2)
Vit D, 25-Hydroxy: 37 ng/mL (ref 30–89)

## 2010-04-24 ENCOUNTER — Telehealth: Payer: Self-pay | Admitting: Family Medicine

## 2010-05-30 ENCOUNTER — Encounter: Payer: Self-pay | Admitting: Family Medicine

## 2010-06-27 ENCOUNTER — Ambulatory Visit: Payer: Self-pay | Admitting: Family Medicine

## 2010-06-29 LAB — CONVERTED CEMR LAB
CO2: 27 meq/L (ref 19–32)
Calcium: 10.1 mg/dL (ref 8.4–10.5)
Chloride: 106 meq/L (ref 96–112)
Glucose, Bld: 107 mg/dL — ABNORMAL HIGH (ref 70–99)
Potassium: 4.8 meq/L (ref 3.5–5.3)
Sodium: 144 meq/L (ref 135–145)

## 2010-07-13 ENCOUNTER — Telehealth: Payer: Self-pay | Admitting: Family Medicine

## 2010-07-14 ENCOUNTER — Encounter: Payer: Self-pay | Admitting: Family Medicine

## 2010-07-17 ENCOUNTER — Telehealth: Payer: Self-pay | Admitting: Family Medicine

## 2010-08-08 ENCOUNTER — Ambulatory Visit: Payer: Self-pay | Admitting: Family Medicine

## 2010-08-08 DIAGNOSIS — H409 Unspecified glaucoma: Secondary | ICD-10-CM

## 2010-08-08 DIAGNOSIS — L819 Disorder of pigmentation, unspecified: Secondary | ICD-10-CM | POA: Insufficient documentation

## 2010-08-10 ENCOUNTER — Encounter: Payer: Self-pay | Admitting: Family Medicine

## 2010-08-10 LAB — CONVERTED CEMR LAB
Creatinine, Urine: 84.7 mg/dL
Microalb Creat Ratio: 88.7 mg/g — ABNORMAL HIGH (ref 0.0–30.0)

## 2010-08-25 ENCOUNTER — Encounter: Payer: Self-pay | Admitting: Family Medicine

## 2010-08-28 ENCOUNTER — Encounter: Payer: Self-pay | Admitting: Family Medicine

## 2010-09-10 ENCOUNTER — Encounter: Payer: Self-pay | Admitting: Family Medicine

## 2010-09-18 ENCOUNTER — Ambulatory Visit: Payer: Self-pay | Admitting: Family Medicine

## 2010-10-10 ENCOUNTER — Ambulatory Visit: Payer: Self-pay | Admitting: Family Medicine

## 2010-10-15 DIAGNOSIS — M549 Dorsalgia, unspecified: Secondary | ICD-10-CM

## 2010-10-16 ENCOUNTER — Encounter: Payer: Self-pay | Admitting: Family Medicine

## 2010-10-17 LAB — CONVERTED CEMR LAB
Albumin: 3.9 g/dL (ref 3.5–5.2)
CO2: 28 meq/L (ref 19–32)
Chloride: 108 meq/L (ref 96–112)
HDL: 66 mg/dL (ref >39)
Hgb A1c MFr Bld: 7 % — ABNORMAL HIGH (ref <5.7)
LDL Cholesterol: 74 mg/dL (ref 0–99)
Potassium: 5.3 meq/L (ref 3.5–5.3)
Sodium: 143 meq/L (ref 135–145)
Total Bilirubin: 0.4 mg/dL (ref 0.3–1.2)
Total CHOL/HDL Ratio: 2.4
Total Protein: 6.7 g/dL (ref 6.0–8.3)
Triglycerides: 85 mg/dL (ref <150)
VLDL: 17 mg/dL (ref 0–40)

## 2010-11-20 ENCOUNTER — Telehealth: Payer: Self-pay | Admitting: Family Medicine

## 2011-01-10 ENCOUNTER — Ambulatory Visit
Admission: RE | Admit: 2011-01-10 | Discharge: 2011-01-10 | Payer: Self-pay | Source: Home / Self Care | Attending: Family Medicine | Admitting: Family Medicine

## 2011-01-10 DIAGNOSIS — R1310 Dysphagia, unspecified: Secondary | ICD-10-CM | POA: Insufficient documentation

## 2011-01-10 DIAGNOSIS — L600 Ingrowing nail: Secondary | ICD-10-CM | POA: Insufficient documentation

## 2011-01-15 ENCOUNTER — Encounter: Payer: Self-pay | Admitting: Family Medicine

## 2011-01-15 ENCOUNTER — Telehealth: Payer: Self-pay | Admitting: Family Medicine

## 2011-01-18 ENCOUNTER — Ambulatory Visit (HOSPITAL_COMMUNITY)
Admission: RE | Admit: 2011-01-18 | Discharge: 2011-01-18 | Payer: Self-pay | Source: Home / Self Care | Attending: Psychiatry | Admitting: Psychiatry

## 2011-01-21 ENCOUNTER — Encounter: Payer: Self-pay | Admitting: Nephrology

## 2011-01-21 ENCOUNTER — Encounter: Payer: Self-pay | Admitting: Family Medicine

## 2011-01-22 ENCOUNTER — Encounter: Payer: Self-pay | Admitting: Orthopedic Surgery

## 2011-01-25 ENCOUNTER — Ambulatory Visit (HOSPITAL_COMMUNITY)
Admission: RE | Admit: 2011-01-25 | Discharge: 2011-01-25 | Payer: Self-pay | Source: Home / Self Care | Attending: Psychiatry | Admitting: Psychiatry

## 2011-01-29 ENCOUNTER — Ambulatory Visit
Admission: RE | Admit: 2011-01-29 | Discharge: 2011-01-29 | Payer: Self-pay | Source: Home / Self Care | Attending: Gastroenterology | Admitting: Gastroenterology

## 2011-01-29 ENCOUNTER — Encounter: Payer: Self-pay | Admitting: Internal Medicine

## 2011-01-29 DIAGNOSIS — K5900 Constipation, unspecified: Secondary | ICD-10-CM | POA: Insufficient documentation

## 2011-01-29 DIAGNOSIS — R110 Nausea: Secondary | ICD-10-CM | POA: Insufficient documentation

## 2011-01-29 DIAGNOSIS — R109 Unspecified abdominal pain: Secondary | ICD-10-CM | POA: Insufficient documentation

## 2011-01-30 ENCOUNTER — Encounter: Payer: Self-pay | Admitting: Internal Medicine

## 2011-01-30 ENCOUNTER — Ambulatory Visit
Admission: RE | Admit: 2011-01-30 | Discharge: 2011-01-30 | Payer: Self-pay | Source: Home / Self Care | Attending: Family Medicine | Admitting: Family Medicine

## 2011-01-30 ENCOUNTER — Telehealth: Payer: Self-pay | Admitting: Family Medicine

## 2011-02-01 ENCOUNTER — Encounter: Payer: Self-pay | Admitting: Family Medicine

## 2011-02-01 DIAGNOSIS — R946 Abnormal results of thyroid function studies: Secondary | ICD-10-CM | POA: Insufficient documentation

## 2011-02-01 LAB — CONVERTED CEMR LAB
BUN: 12 mg/dL (ref 6–23)
Basophils Relative: 0 % (ref 0–1)
Calcium: 9.4 mg/dL (ref 8.4–10.5)
Eosinophils Absolute: 0.2 10*3/uL (ref 0.0–0.7)
Eosinophils Relative: 2 % (ref 0–5)
Glucose, Bld: 69 mg/dL — ABNORMAL LOW (ref 70–99)
HCT: 33.2 % — ABNORMAL LOW (ref 36.0–46.0)
Lymphs Abs: 2.1 10*3/uL (ref 0.7–4.0)
MCHC: 30.4 g/dL (ref 30.0–36.0)
MCV: 89.2 fL (ref 78.0–100.0)
Monocytes Relative: 11 % (ref 3–12)
Platelets: 314 10*3/uL (ref 150–400)
Sodium: 140 meq/L (ref 135–145)
TSH: 7.417 microintl units/mL — ABNORMAL HIGH (ref 0.350–4.500)
WBC: 8.2 10*3/uL (ref 4.0–10.5)

## 2011-02-01 NOTE — Miscellaneous (Signed)
Summary: refill  Clinical Lists Changes  Medications: Rx of CITALOPRAM HYDROBROMIDE 40 MG TABS (CITALOPRAM HYDROBROMIDE) Take 1 tablet by mouth once a day;  #30 x 4;  Signed;  Entered by: Worthy Keeler LPN;  Authorized by: Syliva Overman MD;  Method used: Electronically to [facility] [address] [place], [place], [place]  [address], Ph: [phone] or [phone], Fax: [phone]    Prescriptions: CITALOPRAM HYDROBROMIDE 40 MG TABS (CITALOPRAM HYDROBROMIDE) Take 1 tablet by mouth once a day  #30 x 4   Entered by:   Worthy Keeler LPN   Authorized by:   Syliva Overman MD   Signed by:   Worthy Keeler LPN on 96/29/5284   Method used:   Electronically to        [facility]* (retail)       [address]       [place], [place]  [address]       Ph: [phone] or [phone]       Fax: [phone]   RxID:   [id number]

## 2011-02-01 NOTE — Assessment & Plan Note (Signed)
Summary: office visit   Vital Signs:  Patient profile:   70 year old female Menstrual status:  postmenopausal Height:      66 inches Weight:      267 pounds BMI:     43.25 O2 Sat:      96 % Pulse rate:   60 / minute Resp:     16 per minute BP sitting:   140 / 70  (left arm) Cuff size:   xl  Vitals Entered By: Everitt Amber LPN (March 23, 2010 3:00 PM) CC: back still hurts and knees are still giving her problems, mostly left knee. Wants rx for diabetic shoes   Primary Care Provider:  Dr. Syliva Overman   CC:  back still hurts and knees are still giving her problems and mostly left knee. Wants rx for diabetic shoes.  History of Present Illness: Reports  that she continues to improive overall and is generally doing much better. Though shestill has bak pain , it has lessened with the new medications.  Denies recent fever or chills. Denies sinus pressure, nasal congestion , ear pain or sore throat. Denies chest congestion, or cough productive of sputum. Denies chest pain, palpitations, PND, orthopnea or leg swelling. Denies abdominal pain, nausea, vomitting, diarrhea or constipation. Denies change in bowel movements or bloody stool. Denies dysuria , frequency, incontinence or hesitancy.  Denies headaches, vertigo, seizures. Denies depression, anxiety or insomnia. Denies  rash, lesions, or itch.     Current Medications (verified): 1)  Diltiazem Hcl Er Beads 240 Mg Xr24h-Cap (Diltiazem Hcl Er Beads) .... One Tab Q 12 Hrs 2)  Premarin 0.625 Mg/gm  Crea (Estrogens, Conjugated) .... One Tab By Mouth Once Daily 3)  Calcium 600 1500 Mg  Tabs (Calcium Carbonate) .... One Tab By Mouth Two Times A Day 4)  Gabapentin 300 Mg  Caps (Gabapentin) .Marland Kitchen.. 1 By Mouth Three Times A Day 5)  Proair Hfa 108 (90 Base) Mcg/act  Aers (Albuterol Sulfate) .... Inhale Two Puffs Every Six Hours As Needed 6)  Centrum Silver   Tabs (Multiple Vitamins-Minerals) .... One Tab By Mouth Once Daily 7)   Lorazepam 0.5 Mg  Tabs (Lorazepam) .... One Tab By Mouth Once Daily 8)  Methimazole 10 Mg  Tabs (Methimazole) .... One Tab By Mouth Once Daily 9)  Pyridostigmine Bromide 60 Mg  Tabs (Pyridostigmine Bromide) .... One Tab By Mouth Three Times A Day 10)  Prednisone 5 Mg  Tabs (Prednisone) .... Take One Tablet in The Morning 11)  Glucovance 2.5-500 Mg Tabs (Glyburide-Metformin) .... 2 Tablets Twice Daily 12)  Flax Seed Oil 1000 Mg Caps (Flaxseed (Linseed)) .... One Cap By Mouth Bid 13)  Combigan 0.2-0.5 % Soln (Brimonidine Tartrate-Timolol) .... Uad 14)  Accu-Chek Compact Test Drum  Strp (Glucose Blood) .... Two Times A Day Testing 15)  Simvastatin 80 Mg Tabs (Simvastatin) .... One Tab By Mouth Qhs 16)  Lisinopril-Hydrochlorothiazide 20-12.5 Mg Tabs (Lisinopril-Hydrochlorothiazide) .... Two Tabs By Mouth Qhs 17)  Omeprazole 20 Mg Cpdr (Omeprazole) .... One Cap By Mouth Qd 18)  Fexofenadine Hcl 180 Mg Tabs (Fexofenadine Hcl) .... One Tab By Mouth Qd 19)  Adult Aspirin Low Strength 81 Mg Tbdp (Aspirin) .... One Tab By Mouth Qd 20)  Citalopram Hydrobromide 40 Mg Tabs (Citalopram Hydrobromide) .... Take 1 Tablet By Mouth Once A Day 21)  Promethazine Hcl 25 Mg Tabs (Promethazine Hcl) .... One Tab Q 8 Hrs As Needed 22)  Clonidine Hcl 0.1 Mg Tabs (Clonidine Hcl) .... Take One Tablet  By Mouth Twice A Day 23)  Restasis 0.05 % Emul (Cyclosporine) .... One Drop in Each Eye Two Times A Day 24)  Nu-Iron 150 Mg Caps (Polysaccharide Iron Complex) .... Take 1 Tablet By Mouth Two Times A Day 25)  Miralax  Powd (Polyethylene Glycol 3350) .... As Needed 26)  Hydrocodone-Acetaminophen 10-660 Mg Tabs (Hydrocodone-Acetaminophen) .... Take 1 Tablet By Mouth Two Times A Day  As Needed 27)  Duragesic-25 25 Mcg/hr Pt72 (Fentanyl) .... Change Patch Every 72 Hours 28)  Furosemide 40 Mg Tabs (Furosemide) .... Two Tablets Twice Daily 29)  Oxybutynin Chloride 5 Mg Xr24h-Tab (Oxybutynin Chloride) .... Take 1 Tablet By Mouth Once  A Day 30)  Ipratropium-Albuterol 0.5-2.5 (3) Mg/40ml Soln (Ipratropium-Albuterol) .... Use One Vial in Nebulizer Four Times Daily As Needed 31)  Duragesic-50 50 Mcg/hr Pt72 (Fentanyl) .... Place Every 72 Hrs 32)  Oxybutynin Chloride 5 Mg Xr24h-Tab (Oxybutynin Chloride) .... Take 1 Tablet By Mouth Once A Day  Allergies (verified): No Known Drug Allergies  Review of Systems      See HPI Eyes:  Denies eye pain and red eye. MS:  Complains of joint pain, low back pain, mid back pain, and stiffness; knee pain and stiffness. Endo:  Denies cold intolerance, excessive hunger, excessive thirst, excessive urination, heat intolerance, and polyuria; pt reports hypoglycemic episodes and has been reducing the dose of her meds. Heme:  Denies abnormal bruising and bleeding. Allergy:  Denies hives or rash and itching eyes.  Physical Exam  General:  Well-developed,obesse,in no acute distress; alert,appropriate and cooperative throughout examination HEENT: No facial asymmetry,  EOMI, No sinus tenderness, TM's Clear, oropharynx  pink and moist.Bilateral exopthalmos   Chest: Clear to auscultation bilaterally.  CVS: S1, S2, No murmurs, No S3.   Abd: Soft, Nontender.  MS: decreased  ROM spine, hips, shoulders and knees.  Ext: No edema.   CNS: CN 2-12 intact, power tone and sensation normal throughout.   Skin: Intact, no visible lesions or rashes.  Psych: Good eye contact, normal affect.  Memory intact, not anxious or depressed appearing.   Diabetes Management Exam:    Foot Exam (with socks and/or shoes not present):       Sensory-Monofilament:          Left foot: diminished          Right foot: diminished       Inspection:          Left foot: normal          Right foot: normal       Nails:          Left foot: normal          Right foot: normal   Impression & Recommendations:  Problem # 1:  HYPERTENSION (ICD-401.1) Assessment Deteriorated  The following medications were removed from the  medication list:    Metolazone 5 Mg Tabs (Metolazone) .Marland Kitchen... Take 1 tablet by mouth once a day 30 minutes before first dose of furosemide Her updated medication list for this problem includes:    Diltiazem Hcl Er Beads 240 Mg Xr24h-cap (Diltiazem hcl er beads) ..... One tab q 12 hrs    Lisinopril-hydrochlorothiazide 20-12.5 Mg Tabs (Lisinopril-hydrochlorothiazide) .Marland Kitchen..Marland Kitchen Two tabs by mouth qhs    Clonidine Hcl 0.1 Mg Tabs (Clonidine hcl) .Marland Kitchen... Take one tablet by mouth twice a day    Furosemide 40 Mg Tabs (Furosemide) .Marland Kitchen..Marland Kitchen Two tablets twice daily  BP today: 140/70 Prior BP: 130/62 (12/19/2009)  Labs Reviewed: K+: 4.4 (  11/18/2009) Creat: : 1.40 (11/18/2009)   Chol: 166 (09/21/2009)   HDL: 58 (09/21/2009)   LDL: 88 (09/21/2009)   TG: 102 (09/21/2009)  Problem # 2:  HYPERLIPIDEMIA (ICD-272.4) Assessment: Comment Only  Her updated medication list for this problem includes:    Simvastatin 80 Mg Tabs (Simvastatin) ..... One tab by mouth qhs  Labs Reviewed: SGOT: 13 (10/10/2009)   SGPT: 11 (10/10/2009)   HDL:58 (09/21/2009), 58 (09/21/2009)  LDL:88 (09/21/2009), 88 (09/21/2009)  Chol:166 (09/21/2009), 166 (09/21/2009)  Trig:102 (09/21/2009), 102 (09/21/2009)  Problem # 3:  DIABETES (ICD-250.00) Assessment: Comment Only  The following medications were removed from the medication list:    Januvia 100 Mg Tabs (Sitagliptin phosphate) ..... One tab by mouth qd Her updated medication list for this problem includes:    Glucovance 2.5-500 Mg Tabs (Glyburide-metformin) .Marland Kitchen... 2 tablets twice daily    Lisinopril-hydrochlorothiazide 20-12.5 Mg Tabs (Lisinopril-hydrochlorothiazide) .Marland Kitchen..Marland Kitchen Two tabs by mouth qhs    Adult Aspirin Low Strength 81 Mg Tbdp (Aspirin) ..... One tab by mouth qd  Labs Reviewed: Creat: 1.40 (11/18/2009)    Reviewed HgBA1c results: 6.1 (12/19/2009)  7.4 (09/14/2009)  Problem # 4:  OBESITY, UNSPECIFIED (ICD-278.00) Assessment: Improved  Ht: 66 (03/23/2010)   Wt: 267  (03/23/2010)   BMI: 43.25 (03/23/2010)  Problem # 5:  DEPRESSION (ICD-311) Assessment: Improved  Her updated medication list for this problem includes:    Lorazepam 0.5 Mg Tabs (Lorazepam) ..... One tab by mouth once daily    Citalopram Hydrobromide 40 Mg Tabs (Citalopram hydrobromide) .Marland Kitchen... Take 1 tablet by mouth once a day  Discussed treatment options, including trial of antidpressant medication. Will refer to behavioral health. Follow-up call in in 24-48 hours and recheck in 2 weeks, sooner as needed. Patient agrees to call if any worsening of symptoms or thoughts of doing harm arise. Verified that the patient has no suicidal ideation at this time.   Complete Medication List: 1)  Diltiazem Hcl Er Beads 240 Mg Xr24h-cap (Diltiazem hcl er beads) .... One tab q 12 hrs 2)  Premarin 0.625 Mg/gm Crea (Estrogens, conjugated) .... One tab by mouth once daily 3)  Calcium 600 1500 Mg Tabs (Calcium carbonate) .... One tab by mouth two times a day 4)  Gabapentin 300 Mg Caps (gabapentin)  .Marland Kitchen.. 1 by mouth three times a day 5)  Proair Hfa 108 (90 Base) Mcg/act Aers (Albuterol sulfate) .... Inhale two puffs every six hours as needed 6)  Centrum Silver Tabs (Multiple vitamins-minerals) .... One tab by mouth once daily 7)  Lorazepam 0.5 Mg Tabs (Lorazepam) .... One tab by mouth once daily 8)  Methimazole 10 Mg Tabs (Methimazole) .... One tab by mouth once daily 9)  Pyridostigmine Bromide 60 Mg Tabs (Pyridostigmine bromide) .... One tab by mouth three times a day 10)  Prednisone 5 Mg Tabs (Prednisone) .... Take one tablet in the morning 11)  Glucovance 2.5-500 Mg Tabs (Glyburide-metformin) .... 2 tablets twice daily 12)  Flax Seed Oil 1000 Mg Caps (Flaxseed (linseed)) .... One cap by mouth bid 13)  Combigan 0.2-0.5 % Soln (Brimonidine tartrate-timolol) .... Uad 14)  Accu-chek Compact Test Drum Strp (Glucose blood) .... Two times a day testing 15)  Simvastatin 80 Mg Tabs (Simvastatin) .... One tab by mouth  qhs 16)  Lisinopril-hydrochlorothiazide 20-12.5 Mg Tabs (Lisinopril-hydrochlorothiazide) .... Two tabs by mouth qhs 17)  Omeprazole 20 Mg Cpdr (Omeprazole) .... One cap by mouth qd 18)  Fexofenadine Hcl 180 Mg Tabs (Fexofenadine hcl) .... One tab by mouth qd  19)  Adult Aspirin Low Strength 81 Mg Tbdp (Aspirin) .... One tab by mouth qd 20)  Citalopram Hydrobromide 40 Mg Tabs (Citalopram hydrobromide) .... Take 1 tablet by mouth once a day 21)  Promethazine Hcl 25 Mg Tabs (Promethazine hcl) .... One tab q 8 hrs as needed 22)  Clonidine Hcl 0.1 Mg Tabs (Clonidine hcl) .... Take one tablet by mouth twice a day 23)  Restasis 0.05 % Emul (Cyclosporine) .... One drop in each eye two times a day 24)  Nu-iron 150 Mg Caps (Polysaccharide iron complex) .... Take 1 tablet by mouth two times a day 25)  Miralax Powd (Polyethylene glycol 3350) .... As needed 26)  Hydrocodone-acetaminophen 10-660 Mg Tabs (Hydrocodone-acetaminophen) .... Take 1 tablet by mouth two times a day  as needed 27)  Duragesic-25 25 Mcg/hr Pt72 (Fentanyl) .... Change patch every 72 hours 28)  Furosemide 40 Mg Tabs (Furosemide) .... Two tablets twice daily 29)  Oxybutynin Chloride 5 Mg Xr24h-tab (Oxybutynin chloride) .... Take 1 tablet by mouth once a day 30)  Ipratropium-albuterol 0.5-2.5 (3) Mg/27ml Soln (Ipratropium-albuterol) .... Use one vial in nebulizer four times daily as needed 31)  Duragesic-50 50 Mcg/hr Pt72 (Fentanyl) .... Place every 72 hrs 32)  Oxybutynin Chloride 5 Mg Xr24h-tab (Oxybutynin chloride) .... Take 1 tablet by mouth once a day  Patient Instructions: 1)  Please schedule a follow-up appointment in 3 months. 2)  It is important that you exercise regularly at least 20 minutes 5 times a week. If you develop chest pain, have severe difficulty breathing, or feel very tired , stop exercising immediately and seek medical attention. 3)  You need to lose weight. Consider a lower calorie diet and regular exercise. Congrats ,  keep it up. 4)  Fasdting labs  5)  CUT bACK glucovance to one twice daily, if blood sugars low then one daily Prescriptions: DILTIAZEM HCL ER BEADS 240 MG XR24H-CAP (DILTIAZEM HCL ER BEADS) one tab q 12 hrs  #60 x 3   Entered by:   Everitt Amber LPN   Authorized by:   Syliva Overman MD   Signed by:   Everitt Amber LPN on 13/24/4010   Method used:   Electronically to        [facility]* (retail)       [address]       [place], [place]  [address]       Ph: [phone] or [phone]       Fax: [phone]   RxID:   [id number] OMEPRAZOLE 20 MG CPDR (OMEPRAZOLE) ONE CAP by mouth QD  #30 x 3   Entered by:   Everitt Amber LPN   Authorized by:   Syliva Overman MD   Signed by:   Everitt Amber LPN on 57/32/2025   Method used:   Electronically to        [facility] (retail)       [address]       [place], [place]  [address]       Ph: [phone] or [phone]       Fax: [phone]   RxID:   [id number] LISINOPRIL-HYDROCHLOROTHIAZIDE 20-12.5 MG TABS (LISINOPRIL-HYDROCHLOROTHIAZIDE) TWO TABS by mouth QHS  #60 x 3   Entered by:   Everitt Amber LPN   Authorized by:   Syliva Overman MD   Signed by:   Everitt Amber LPN on 16/96/7893   Method used:  Electronically to        [facility] (retail)       [address]       [place], [place]  [address]       Ph: [phone] or [phone]       Fax: [phone]   RxID:   [id number] GLUCOVANCE 2.5-500 MG TABS (GLYBURIDE-METFORMIN) 2 tablets twice daily  #120 x 3   Entered by:   Everitt Amber LPN   Authorized by:   Syliva Overman MD   Signed by:   Everitt Amber LPN on 27/25/3664   Method used:   Electronically to        [facility] (retail)       [address]       [place], [place]  [address]       Ph: [phone] or [phone]       Fax: [phone]   RxID:   [id number]

## 2011-02-01 NOTE — Progress Notes (Signed)
Summary: NASAL SPRAY  Phone Note Call from Patient   Summary of Call: PATIENT REQUEST FOR NASAL SPRAY AND IF YOU DO NOT HAVE ANY PLEASE CALLED [facility] Initial call taken by: Eugenio Hoes,  November 20, 2010 11:49 AM  Follow-up for Phone Call        pls let pt know med sent in Follow-up by: Syliva Overman MD,  November 21, 2010 12:53 PM  Additional Follow-up for Phone Call Additional follow up Details #1::        left message Additional Follow-up by: Lind Guest,  November 21, 2010 1:49 PM    New/Updated Medications: FLUTICASONE PROPIONATE 50 MCG/ACT SUSP (FLUTICASONE PROPIONATE) one to two puffs per nostril dally as needed for uncontrolled allergies Prescriptions: FLUTICASONE PROPIONATE 50 MCG/ACT SUSP (FLUTICASONE PROPIONATE) one to two puffs per nostril dally as needed for uncontrolled allergies  #1 x 3   Entered and Authorized by:   Syliva Overman MD   Signed by:   Syliva Overman MD on 11/21/2010   Method used:   Electronically to        [facility] (retail)       [address]       [place], [place]  [address]       Ph: [phone]       Fax: [phone]   RxID:   [id number]

## 2011-02-01 NOTE — Miscellaneous (Signed)
Summary: [facility]  Clinical Lists Changes  Medications: Rx of DURAGESIC-25 25 MCG/HR PT72 (FENTANYL) change patch every 72 hours;  #10 x 0;  Signed;  Entered by: Everitt Amber;  Authorized by: Syliva Overman MD;  Method used: Handwritten    Prescriptions: DURAGESIC-25 25 MCG/HR PT72 (FENTANYL) change patch every 72 hours  #10 x 0   Entered by:   Everitt Amber   Authorized by:   Syliva Overman MD   Signed by:   Everitt Amber on 01/19/2010   Method used:   Handwritten   RxID:   [id number]

## 2011-02-01 NOTE — Progress Notes (Signed)
Summary: [facility]   Imported By: Lind Guest 07/17/2010 13:55:41  _____________________________________________________________________  External Attachment:    Type:   Image     Comment:   External Document

## 2011-02-01 NOTE — Assessment & Plan Note (Signed)
Summary: follow up chronic problems   Vital Signs:  Patient Profile:   70 Years Old Female Height:     65.5 inches Weight:      283.44 pounds BMI:     46.62 Pulse rate:   163 / minute Resp:     18 per minute BP sitting:   100 / 70  (left arm)  Pt. in pain?   no  Vitals Entered By: Worthy Keeler LPN (August 02, 2008 2:07 PM)                  Chief Complaint:  feet and legs swelling.  History of Present Illness: Patient c/o malaise , shortness of breath and leg swelling worse in the past 4 days. She also reports unsteady gait and nausea intermittently in the past 2 weeks. She has become more withdrawn socially and feels depressed, She is concerned about excessive exposure to mold in her home and brings in a home based test which suggests that there is an xs amt. of mold at home. She denies fever or chills.    Updated Prior Medication List: IBUPROFEN 800 MG  TABS (IBUPROFEN) tid GLUCOVANCE 2.5-500 MG  TABS (GLYBURIDE-METFORMIN)  CARDIZEM CD 240 MG  CP24 (DILTIAZEM HCL COATED BEADS) one tab by mouth two times a day PREMARIN 0.625 MG/GM  CREA (ESTROGENS, CONJUGATED) one tab by mouth once daily AVALIDE 300-12.5 MG  TABS (IRBESARTAN-HYDROCHLOROTHIAZIDE) one tab by mouth once daily CALCIUM 600 1500 MG  TABS (CALCIUM CARBONATE) one tab by mouth two times a day COLACE 100 MG  CAPS (DOCUSATE SODIUM) one cap by mouth at bedtime ACIPHEX 20 MG  TBEC (RABEPRAZOLE SODIUM) one tab by mouth once daily GABAPENTIN 100 MG  CAPS (GABAPENTIN) 1 by mouth three times a day PROAIR HFA 108 (90 BASE) MCG/ACT  AERS (ALBUTEROL SULFATE) inhale two puffs every six hours as needed CADUET 10-80 MG  TABS (AMLODIPINE-ATORVASTATIN) one tab by mouth at bedtime KLOR-CON M20 20 MEQ  CR-TABS (POTASSIUM CHLORIDE CRYS CR) one tab by mouth two times a day PREDNISONE 5 MG  TABS (PREDNISONE) one tab by mouth once daily FUROSEMIDE 20 MG  TABS (FUROSEMIDE) two tabs by mouth every morning and one tab by mouth at  2pm CITALOPRAM HYDROBROMIDE 10 MG  TABS (CITALOPRAM HYDROBROMIDE) one tab by mouth once daily ZYRTEC ALLERGY 10 MG  TABS (CETIRIZINE HCL) one tab by mouth once daily CENTRUM SILVER   TABS (MULTIPLE VITAMINS-MINERALS) one tab by mouth once daily LORAZEPAM 0.5 MG  TABS (LORAZEPAM) one tab by mouth once daily METHIMAZOLE 10 MG  TABS (METHIMAZOLE) one tab by mouth once daily PYRIDOSTIGMINE BROMIDE 60 MG  TABS (PYRIDOSTIGMINE BROMIDE) one tab by mouth three times a day  Current Allergies: No known allergies   Past Surgical History:    RT [facility]KA 05/13/07 - Dr Romeo Apple    Gallbladder    Hysterectomy   Social History:    disability    Single    Quit nicotine    Alcohol use-no    Drug use-no    Review of Systems  ENT      Denies hoarseness, nasal congestion, and sinus pressure.  CV      Complains of fatigue, shortness of breath with exertion, and swelling of feet.      Denies chest pain or discomfort.  Resp      Complains of wheezing.      Denies cough and sputum productive.  GI      Complains of nausea.  Denies abdominal pain, constipation, diarrhea, and vomiting.  GU      Denies dysuria and urinary frequency.  MS      Complains of joint pain and stiffness.  Psych      Complains of depression.      Denies suicidal thoughts/plans and thoughts of violence.   Physical Exam  General:     overweight-appearing.   Head:     Normocephalic and atraumatic without obvious abnormalities. No apparent alopecia or balding. Eyes:     vision grossly intact.   Ears:     R ear normal and L ear normal.   Nose:     no external deformity, nose piercing noted, no external erythema, and no nasal discharge.   Mouth:     pharynx pink and moist and fair dentition.   Neck:     No deformities, masses, or tenderness noted. Lungs:     Normal respiratory effort, chest expands symmetrically. Lungs are clear to auscultation, no crackles or wheezes. Heart:     Normal rate and regular  rhythm. S1 and S2 normal without gallop, murmur, click, rub or other extra sounds. Abdomen:     soft and non-tender.   Extremities:     3 plus pitting edema bilaterally. Neurologic:     alert & oriented X3, cranial nerves II-XII intact, and strength normal in all extremities.      Impression & Recommendations:  Problem # 1:  LEG EDEMA, BILATERAL (ICD-782.3) Assessment: Deteriorated  Her updated medication list for this problem includes:    Avalide 300-12.5 Mg Tabs (Irbesartan-hydrochlorothiazide) ..... One tab by mouth once daily    Furosemide 20 Mg Tabs (Furosemide) .Marland Kitchen..Marland Kitchen Two tabs by mouth every morning and one tab by mouth at 2pm    Zaroxolyn 2.5 Mg Tabs (Metolazone) .Marland Kitchen... Take 1 tablet by mouth once a day  Orders: Furosemide- Lasix Injection (Z6109)   Problem # 2:  GERD (ICD-530.81) Assessment: Unchanged  Her updated medication list for this problem includes:    Aciphex 20 Mg Tbec (Rabeprazole sodium) ..... One tab by mouth once daily   Problem # 3:  DEPRESSION (ICD-311) Assessment: Deteriorated  Her updated medication list for this problem includes:    Citalopram Hydrobromide 10 Mg Tabs (Citalopram hydrobromide) ..... One tab by mouth once daily    Lorazepam 0.5 Mg Tabs (Lorazepam) ..... One tab by mouth once daily will consider med adjustment at next visit.   Problem # 4:  HYPERLIPIDEMIA (ICD-272.4) Assessment: Unchanged  The following medications were removed from the medication list:    Caduet 5-40 Mg Tabs (Amlodipine-atorvastatin)  Her updated medication list for this problem includes:    Caduet 10-80 Mg Tabs (Amlodipine-atorvastatin) ..... One tab by mouth at bedtime  Labs Reviewed: Chol: 177 (04/01/2008)   HDL: 65 (04/01/2008)   LDL: 90 (04/01/2008)   TG: 112 (04/01/2008) SGOT: 10 (04/01/2008)   SGPT: 16 (04/01/2008)   Problem # 5:  OBESITY (ICD-278.00) Assessment: Deteriorated  Problem # 6:  HYPERTENSION (ICD-401.9) Assessment: Unchanged  The  following medications were removed from the medication list:    Caduet 5-40 Mg Tabs (Amlodipine-atorvastatin)  Her updated medication list for this problem includes:    Cardizem Cd 240 Mg Cp24 (Diltiazem hcl coated beads) ..... One tab by mouth two times a day    Avalide 300-12.5 Mg Tabs (Irbesartan-hydrochlorothiazide) ..... One tab by mouth once daily    Caduet 10-80 Mg Tabs (Amlodipine-atorvastatin) ..... One tab by mouth at bedtime    Furosemide 20  Mg Tabs (Furosemide) .Marland Kitchen..Marland Kitchen Two tabs by mouth every morning and one tab by mouth at 2pm    Zaroxolyn 2.5 Mg Tabs (Metolazone) .Marland Kitchen... Take 1 tablet by mouth once a day   Complete Medication List: 1)  Ibuprofen 800 Mg Tabs (Ibuprofen) .... Tid 2)  Glucovance 2.5-500 Mg Tabs (Glyburide-metformin) .... Take 1 tablet by mouth two times a day 3)  Cardizem Cd 240 Mg Cp24 (Diltiazem hcl coated beads) .... One tab by mouth two times a day 4)  Premarin 0.625 Mg/gm Crea (Estrogens, conjugated) .... One tab by mouth once daily 5)  Avalide 300-12.5 Mg Tabs (Irbesartan-hydrochlorothiazide) .... One tab by mouth once daily 6)  Calcium 600 1500 Mg Tabs (Calcium carbonate) .... One tab by mouth two times a day 7)  Colace 100 Mg Caps (Docusate sodium) .... One cap by mouth at bedtime 8)  Aciphex 20 Mg Tbec (Rabeprazole sodium) .... One tab by mouth once daily 9)  Gabapentin 100 Mg Caps (Gabapentin) .Marland Kitchen.. 1 by mouth three times a day 10)  Proair Hfa 108 (90 Base) Mcg/act Aers (Albuterol sulfate) .... Inhale two puffs every six hours as needed 11)  Caduet 10-80 Mg Tabs (Amlodipine-atorvastatin) .... One tab by mouth at bedtime 12)  Klor-con M20 20 Meq Cr-tabs (Potassium chloride crys cr) .... One tab by mouth three  times a day 13)  Prednisone 5 Mg Tabs (Prednisone) .... One tab by mouth once daily 14)  Furosemide 20 Mg Tabs (Furosemide) .... Two tabs by mouth every morning and one tab by mouth at 2pm 15)  Citalopram Hydrobromide 10 Mg Tabs (Citalopram  hydrobromide) .... One tab by mouth once daily 16)  Zyrtec Allergy 10 Mg Tabs (Cetirizine hcl) .... One tab by mouth once daily 17)  Centrum Silver Tabs (Multiple vitamins-minerals) .... One tab by mouth once daily 18)  Lorazepam 0.5 Mg Tabs (Lorazepam) .... One tab by mouth once daily 19)  Methimazole 10 Mg Tabs (Methimazole) .... One tab by mouth once daily 20)  Pyridostigmine Bromide 60 Mg Tabs (Pyridostigmine bromide) .... One tab by mouth three times a day 21)  Zaroxolyn 2.5 Mg Tabs (Metolazone) .... Take 1 tablet by mouth once a day  Other Orders: Insulin 5 units (A2130)   Patient Instructions: 1)  Follow up  on 08/12/08. 2)  Check your blood sugars regularly. If your readings are usually above250: or below 70 you should contact our office. 3)  Check your Blood Pressure regularly. If it is above150/95  you should make an appointment. 4)  Come daily up until Thursday for injections from the nurse for your leg swelling. 5)  Fill new presriptions today. 6)  BMP prior to visit, ICD-9: 7)  Echocardiogram to be scheduled.   Prescriptions: KLOR-CON M20 20 MEQ  CR-TABS (POTASSIUM CHLORIDE CRYS CR) one tab by mouth three  times a day  #90 x 0   Entered and Authorized by:   Syliva Overman MD   Signed by:   Syliva Overman MD on 08/02/2008   Method used:   Electronically sent to ...       [facility]*       [address]       [place], [place]  [address]       Ph: [phone] or [phone]       Fax: [phone]   RxID:   [id number] ZAROXOLYN 2.5 MG  TABS (METOLAZONE) Take 1 tablet by mouth once a day  #  30 x 0   Entered and Authorized by:   Syliva Overman MD   Signed by:   Syliva Overman MD on 08/02/2008   Method used:   Electronically sent to ...       [facility]       [address]       [place], [place]  [address]       Ph: [phone] or [phone]       Fax: [phone]   RxID:    [id number] GLUCOVANCE 2.5-500 MG  TABS [facility]) Take21 tablet by mouth two times a day  #120 x 5   Entered and Authorized by:   Syliva Overman MD   Signed by:   Syliva Overman MD on 08/02/2008   Method used:   Electronically sent to ...       [facility]*       [address]       [place], [place]  [address]       Ph: [phone] or [phone]       Fax: [phone]   RxID:   [id number]  ] Laboratory Results   Blood Tests   Date/Time Received: 08/02/08 2:22am Date/Time Reported: 08/02/08 2:22am  Glucose (random): 344 mg/dL   (Normal Range: 57-322) HGBA1C: 9.0%   (Normal Range: Non-Diabetic - 3-6%   Control Diabetic - 6-8%)      Medication Administration  Injection # 1:    Medication: Furosemide- Lasix Injection    Diagnosis: LEG EDEMA, BILATERAL (ICD-782.3)    Route: IM    Site: RUOQ gluteus    Exp Date: 04/30/2009    Lot #: [id number]    Mfr: [email or web address]    Comments: 20mg  given    Patient tolerated injection without complications    Given by: Chipper Herb (August 02, 2008 3:13 PM)  Injection # 2:    Medication: Insulin 5 units    Diagnosis: DIABETES (ICD-250.00)    Route: SQ    Site: L deltoid    Exp Date: 12/31/2008    Lot #: [id number]    Mfr: [email or web address]    Patient tolerated injection without complications    Given by: Chipper Herb (August 02, 2008 3:16 PM)  Orders Added: 1)  Furosemide- Lasix Injection [J1940] 2)  Insulin 5 units [J1815]

## 2011-02-01 NOTE — Assessment & Plan Note (Signed)
Summary: office visit   Vital Signs:  Patient profile:   70 year old female Menstrual status:  postmenopausal Height:      66 inches Weight:      267 pounds BMI:     43.25 O2 Sat:      97 % Pulse rate:   57 / minute Pulse rhythm:   regular Resp:     16 per minute BP sitting:   142 / 64  (right arm) Cuff size:   xl   Vitals Entered By: Everitt Amber LPN (June 27, 2010 1:24 PM)  Nutrition Counseling: Patient's BMI is greater than 25 and therefore counseled on weight management options. CC: Follow up chronic problems, some swelling in feet also   Primary Care Provider:  Dr. Syliva Overman   CC:  Follow up chronic problems and some swelling in feet also.  History of Present Illness: Reports  thatshe has not been doing well. Denies recent fever or chills. Denies sinus pressure, nasal congestion , ear pain or sore throat. Denies chest congestion, or cough productive of sputum. Denies chest pain, palpitations, PND, orthopnea or leg swelling. Denies abdominal pain, nausea, vomitting, diarrhea or constipation. Denies change in bowel movements or bloody stool. Denies dysuria , frequency, incontinence or hesitancy. Denies  joint pain, swelling, or reduced mobility. Denies headaches, vertigo, seizures. She c/o increased depression symptoms, she is not suicidal or homicidal, she has no hallucinations, mshe experiences melancholy, anhidonia, and reports getting upset and tearful easily. she feels somewhat neglected by he family, although she does report them to be very present. I have suggested considering volunteerism, and she thinks this a good idea. Denies  rash, lesions, or itch.     Current Medications (verified): 1)  Diltiazem Hcl Er Beads 240 Mg Xr24h-Cap (Diltiazem Hcl Er Beads) .... One Tab Q 12 Hrs 2)  Premarin 0.625 Mg/gm  Crea (Estrogens, Conjugated) .... One Tab By Mouth Once Daily 3)  Calcium 600 1500 Mg  Tabs (Calcium Carbonate) .... One Tab By Mouth Two Times A  Day 4)  Gabapentin 300 Mg  Caps (Gabapentin) .Marland Kitchen.. 1 By Mouth Three Times A Day 5)  Proair Hfa 108 (90 Base) Mcg/act  Aers (Albuterol Sulfate) .... Inhale Two Puffs Every Six Hours As Needed 6)  Centrum Silver   Tabs (Multiple Vitamins-Minerals) .... One Tab By Mouth Once Daily 7)  Lorazepam 0.5 Mg  Tabs (Lorazepam) .... One Tab By Mouth Once Daily 8)  Methimazole 10 Mg  Tabs (Methimazole) .... One Tab By Mouth Once Daily 9)  Pyridostigmine Bromide 60 Mg  Tabs (Pyridostigmine Bromide) .... One Tab By Mouth Three Times A Day 10)  Prednisone 5 Mg  Tabs (Prednisone) .... Take One Tablet in The Morning 11)  Glucovance 2.5-500 Mg Tabs (Glyburide-Metformin) .... 2 Tablets Twice Daily 12)  Flax Seed Oil 1000 Mg Caps (Flaxseed (Linseed)) .... One Cap By Mouth Bid 13)  Combigan 0.2-0.5 % Soln (Brimonidine Tartrate-Timolol) .... Uad 14)  Accu-Chek Compact Test Drum  Strp (Glucose Blood) .... Two Times A Day Testing 15)  Simvastatin 80 Mg Tabs (Simvastatin) .... One Tab By Mouth Qhs 16)  Lisinopril-Hydrochlorothiazide 20-12.5 Mg Tabs (Lisinopril-Hydrochlorothiazide) .... Two Tabs By Mouth Qhs 17)  Omeprazole 20 Mg Cpdr (Omeprazole) .... One Cap By Mouth Qd 18)  Fexofenadine Hcl 180 Mg Tabs (Fexofenadine Hcl) .... One Tab By Mouth Qd 19)  Adult Aspirin Low Strength 81 Mg Tbdp (Aspirin) .... One Tab By Mouth Qd 20)  Citalopram Hydrobromide 40 Mg Tabs (Citalopram  Hydrobromide) .... Take 1 Tablet By Mouth Once A Day 21)  Promethazine Hcl 25 Mg Tabs (Promethazine Hcl) .... One Tab Q 8 Hrs As Needed 22)  Restasis 0.05 % Emul (Cyclosporine) .... One Drop in Each Eye Two Times A Day 23)  Nu-Iron 150 Mg Caps (Polysaccharide Iron Complex) .... Take 1 Tablet By Mouth Two Times A Day 24)  Miralax  Powd (Polyethylene Glycol 3350) .... As Needed 25)  Hydrocodone-Acetaminophen 10-660 Mg Tabs (Hydrocodone-Acetaminophen) .... Take 1 Tablet By Mouth Two Times A Day  As Needed 26)  Duragesic-25 25 Mcg/hr Pt72 (Fentanyl)  .... Change Patch Every 72 Hours 27)  Oxybutynin Chloride 5 Mg Xr24h-Tab (Oxybutynin Chloride) .... Take 1 Tablet By Mouth Once A Day 28)  Ipratropium-Albuterol 0.5-2.5 (3) Mg/31ml Soln (Ipratropium-Albuterol) .... Use One Vial in Nebulizer Four Times Daily As Needed 29)  Oxybutynin Chloride 5 Mg Xr24h-Tab (Oxybutynin Chloride) .... Take 1 Tablet By Mouth Once A Day 30)  Furosemide 40 Mg Tabs (Furosemide) .... Take 1 Tablet By Mouth Two Times A Day As Needed 31)  Klor-Con M20 20 Meq Cr-Tabs (Potassium Chloride Crys Cr) .... Take 1 Tablet By Mouth Three Times A Day As Needed 32)  Nu-Iron 150 Mg Caps (Polysaccharide Iron Complex) .... Take 1 Tablet By Mouth Two Times A Day 33)  Macrobid 100 Mg Caps (Nitrofurantoin Monohyd Macro) .... Take 1 Tablet By Mouth Once A Day  Allergies (verified): No Known Drug Allergies  Review of Systems      See HPI General:  Complains of fatigue. Eyes:  Complains of vision loss-both eyes; denies eye pain and red eye; dx with glaucoma. MS:  Complains of muscle weakness; bilateral ptosis, she has myasthenia. Endo:  Denies cold intolerance, excessive hunger, excessive thirst, excessive urination, heat intolerance, polyuria, and weight change; avg fastings are around 130, she tests regularly. Heme:  Denies abnormal bruising and bleeding. Allergy:  Denies hives or rash and itching eyes.  Physical Exam  General:  Well-developed,obesse,in no acute distress; alert,appropriate and cooperative throughout examination HEENT: No facial asymmetry,  EOMI, No sinus tenderness, TM's Clear, oropharynx  pink and moist.Bilateral exopthalmos   Chest: Clear to auscultation bilaterally.  CVS: S1, S2, No murmurs, No S3.   Abd: Soft, Nontender.  MS: decreased  ROM spine, hips, shoulders and knees.  Ext: No edema.   CNS: CN 2-12 intact, power tone and sensation normal throughout.   Skin: Intact, no visible lesions or rashes.  Psych: Good eye contact, normal affect.  Memory intact,   depressed appearing.    Impression & Recommendations:  Problem # 1:  HYPERTENSION (ICD-401.1) Assessment Unchanged  The following medications were removed from the medication list:    Clonidine Hcl 0.1 Mg Tabs (Clonidine hcl) .Marland Kitchen... Take one tablet by mouth twice a day    Furosemide 40 Mg Tabs (Furosemide) .Marland Kitchen..Marland Kitchen Two tablets twice daily Her updated medication list for this problem includes:    Diltiazem Hcl Er Beads 240 Mg Xr24h-cap (Diltiazem hcl er beads) ..... One tab q 12 hrs    Lisinopril-hydrochlorothiazide 20-12.5 Mg Tabs (Lisinopril-hydrochlorothiazide) .Marland Kitchen..Marland Kitchen Two tabs by mouth qhs    Furosemide 40 Mg Tabs (Furosemide) .Marland Kitchen... Take 1 tablet by mouth two times a day as needed  Orders: T-Basic Metabolic Panel ([id number])  BP today: 142/64 Prior BP: 140/70 (03/23/2010)  Labs Reviewed: K+: 4.4 (11/18/2009) Creat: : 1.40 (11/18/2009)   Chol: 239 (03/30/2010)   HDL: 56 (03/30/2010)   LDL: 160 (03/30/2010)   TG: 116 (03/30/2010)  Problem #  2:  HYPERLIPIDEMIA (ICD-272.4) Assessment: Deteriorated  The following medications were removed from the medication list:    Simvastatin 80 Mg Tabs (Simvastatin) ..... One tab by mouth qhs Her updated medication list for this problem includes:    Crestor 20 Mg Tabs (Rosuvastatin calcium) .Marland Kitchen... Take 1 tab by mouth at bedtime  Labs Reviewed: SGOT: 13 (03/30/2010)   SGPT: 10 (03/30/2010)   HDL:56 (03/30/2010), 58 (09/21/2009)  LDL:160 (03/30/2010), 88 (16/10/9603)  Chol:239 (03/30/2010), 166 (09/21/2009)  Trig:116 (03/30/2010), 102 (09/21/2009)  Problem # 3:  DIABETES (ICD-250.00) Assessment: Unchanged  Her updated medication list for this problem includes:    Glucovance 2.5-500 Mg Tabs (Glyburide-metformin) .Marland Kitchen... 2 tablets twice daily    Lisinopril-hydrochlorothiazide 20-12.5 Mg Tabs (Lisinopril-hydrochlorothiazide) .Marland Kitchen..Marland Kitchen Two tabs by mouth qhs    Adult Aspirin Low Strength 81 Mg Tbdp (Aspirin) ..... One tab by mouth qd  Orders: T-  Hemoglobin A1C ([id number])  Labs Reviewed: Creat: 1.40 (11/18/2009)    Reviewed HgBA1c results: 7.3 (03/30/2010)  6.1 (12/19/2009)  Problem # 4:  HYPOTHYROIDISM (ICD-244.9) Assessment: Unchanged  Labs Reviewed: TSH: 4.284 (03/30/2010)    HgBA1c: 7.3 (03/30/2010) Chol: 239 (03/30/2010)   HDL: 56 (03/30/2010)   LDL: 160 (03/30/2010)   TG: 116 (03/30/2010)  Problem # 5:  GERD (ICD-530.81) Assessment: Improved  Her updated medication list for this problem includes:    Omeprazole 20 Mg Cpdr (Omeprazole) ..... One cap by mouth qd  Problem # 6:  DEPRESSION (ICD-311) Assessment: Deteriorated  Her updated medication list for this problem includes:    Lorazepam 0.5 Mg Tabs (Lorazepam) ..... One tab by mouth once daily    Citalopram Hydrobromide 40 Mg Tabs (Citalopram hydrobromide) .Marland Kitchen... Take 1 tablet by mouth once a day    Venlafaxine Hcl 75 Mg Xr24h-cap (Venlafaxine hcl) .Marland Kitchen... Take 1 capsule by mouth once a day  Complete Medication List: 1)  Diltiazem Hcl Er Beads 240 Mg Xr24h-cap (Diltiazem hcl er beads) .... One tab q 12 hrs 2)  Premarin 0.625 Mg/gm Crea (Estrogens, conjugated) .... One tab by mouth once daily 3)  Calcium 600 1500 Mg Tabs (Calcium carbonate) .... One tab by mouth two times a day 4)  Gabapentin 300 Mg Caps (gabapentin)  .Marland Kitchen.. 1 by mouth three times a day 5)  Proair Hfa 108 (90 Base) Mcg/act Aers (Albuterol sulfate) .... Inhale two puffs every six hours as needed 6)  Centrum Silver Tabs (Multiple vitamins-minerals) .... One tab by mouth once daily 7)  Lorazepam 0.5 Mg Tabs (Lorazepam) .... One tab by mouth once daily 8)  Methimazole 10 Mg Tabs (Methimazole) .... One tab by mouth once daily 9)  Pyridostigmine Bromide 60 Mg Tabs (Pyridostigmine bromide) .... One tab by mouth three times a day 10)  Prednisone 5 Mg Tabs (Prednisone) .... Take one tablet in the morning 11)  Glucovance 2.5-500 Mg Tabs (Glyburide-metformin) .... 2 tablets twice daily 12)  Flax Seed Oil 1000  Mg Caps (Flaxseed (linseed)) .... One cap by mouth bid 13)  Combigan 0.2-0.5 % Soln (Brimonidine tartrate-timolol) .... Uad 14)  Accu-chek Compact Test Drum Strp (Glucose blood) .... Two times a day testing 15)  Lisinopril-hydrochlorothiazide 20-12.5 Mg Tabs (Lisinopril-hydrochlorothiazide) .... Two tabs by mouth qhs 16)  Omeprazole 20 Mg Cpdr (Omeprazole) .... One cap by mouth qd 17)  Fexofenadine Hcl 180 Mg Tabs (Fexofenadine hcl) .... One tab by mouth qd 18)  Adult Aspirin Low Strength 81 Mg Tbdp (Aspirin) .... One tab by mouth qd 19)  Citalopram Hydrobromide 40 Mg Tabs (Citalopram  hydrobromide) .... Take 1 tablet by mouth once a day 20)  Promethazine Hcl 25 Mg Tabs (Promethazine hcl) .... One tab q 8 hrs as needed 21)  Restasis 0.05 % Emul (Cyclosporine) .... One drop in each eye two times a day 22)  Nu-iron 150 Mg Caps (Polysaccharide iron complex) .... Take 1 tablet by mouth two times a day 23)  Miralax Powd (Polyethylene glycol 3350) .... As needed 24)  Hydrocodone-acetaminophen 10-660 Mg Tabs (Hydrocodone-acetaminophen) .... Take 1 tablet by mouth two times a day  as needed 25)  Duragesic-25 25 Mcg/hr Pt72 (Fentanyl) .... Change patch every 72 hours 26)  Oxybutynin Chloride 5 Mg Xr24h-tab (Oxybutynin chloride) .... Take 1 tablet by mouth once a day 27)  Ipratropium-albuterol 0.5-2.5 (3) Mg/60ml Soln (Ipratropium-albuterol) .... Use one vial in nebulizer four times daily as needed 28)  Oxybutynin Chloride 5 Mg Xr24h-tab (Oxybutynin chloride) .... Take 1 tablet by mouth once a day 29)  Furosemide 40 Mg Tabs (Furosemide) .... Take 1 tablet by mouth two times a day as needed 30)  Klor-con M20 20 Meq Cr-tabs (Potassium chloride crys cr) .... Take 1 tablet by mouth three times a day as needed 31)  Nu-iron 150 Mg Caps (Polysaccharide iron complex) .... Take 1 tablet by mouth two times a day 32)  Macrobid 100 Mg Caps (Nitrofurantoin monohyd macro) .... Take 1 tablet by mouth once a day 33)   Crestor 20 Mg Tabs (Rosuvastatin calcium) .... Take 1 tab by mouth at bedtime 34)  Venlafaxine Hcl 75 Mg Xr24h-cap (Venlafaxine hcl) .... Take 1 capsule by mouth once a day  Patient Instructions: 1)  f/u in 6 weeks. 2)  New med  for depression, also pls start to walk at least 3 days per week, and look into being a volunteer . 3)  HbgA1C prior to visit, ICD-9: 4)  chem 7 Prescriptions: CRESTOR 20 MG TABS (ROSUVASTATIN CALCIUM) Take 1 tab by mouth at bedtime  #42 x 0   Entered by:   Everitt Amber LPN   Authorized by:   Syliva Overman MD   Signed by:   Syliva Overman MD on 07/03/2010   Method used:   Samples Given   RxID:   [id number] DURAGESIC-25 25 MCG/HR PT72 (FENTANYL) change patch every 72 hours  #10 x 0   Entered by:   Everitt Amber LPN   Authorized by:   Syliva Overman MD   Signed by:   Everitt Amber LPN on 13/24/4010   Method used:   Handwritten   RxID:   [id number] VENLAFAXINE HCL 75 MG XR24H-CAP (VENLAFAXINE HCL) Take 1 capsule by mouth once a day  #30 x 2   Entered and Authorized by:   Syliva Overman MD   Signed by:   Syliva Overman MD on 06/27/2010   Method used:   Electronically to        [facility]* (retail)       [address]       [place], [place]  [address]       Ph: [phone] or [phone]       Fax: [phone]   RxID:   [id number] CRESTOR 20 MG TABS (ROSUVASTATIN CALCIUM) Take 1 tab by mouth at bedtime  #30 x 2   Entered and Authorized by:   Syliva Overman MD   Signed by:   Syliva Overman MD on 06/27/2010   Method used:   Printed then faxed to .Marland KitchenMar[facility] Kitchen  [facility]* (retail)       [address]       [place], [place]  [address]       Ph: [phone] or [phone]       Fax: [phone]   RxID:   [id number]

## 2011-02-01 NOTE — Progress Notes (Signed)
  Phone Note From Pharmacy   Caller: [facility] Summary of Call: otc fexofenadine no covered ok to change to loratadine? Initial call taken by: Adella Hare LPN,  July 13, 2010 4:47 PM  Follow-up for Phone Call        yes, pls erxloratidine10mg  Take 1 tablet by mouth once a day #30 refill 4 Follow-up by: Syliva Overman MD,  July 13, 2010 5:08 PM  Additional Follow-up for Phone Call Additional follow up Details #1::        also [facility] had sent in a request for I think 3 or 4 meds she wants sent in to [facility] as new pls f/u on this and it is ok todo this Additional Follow-up by: Syliva Overman MD,  July 13, 2010 5:12 PM    New/Updated Medications: LORATADINE 10 MG TABS (LORATADINE) one tab by mouth once daily  discontinue allegra Prescriptions: LORATADINE 10 MG TABS (LORATADINE) one tab by mouth once daily  discontinue allegra  #30 x 4   Entered by:   Adella Hare LPN   Authorized by:   Syliva Overman MD   Signed by:   Adella Hare LPN on 91/47/8295   Method used:   Electronically to        [facility] (retail)       [address]       [place], [place]  [address]       Ph: [phone]       Fax: [phone]   RxID:   [id number]

## 2011-02-01 NOTE — Letter (Signed)
Summary: DOSE INCREASE IN MEDICINE  DOSE INCREASE IN MEDICINE   Imported By: Lind Guest 01/15/2011 14:58:30  _____________________________________________________________________  External Attachment:    Type:   Image     Comment:   External Document

## 2011-02-01 NOTE — Assessment & Plan Note (Signed)
Summary: office visit   Vital Signs:  Patient profile:   70 year old female Menstrual status:  postmenopausal Height:      66 inches Weight:      268 pounds BMI:     43.41 O2 Sat:      98 % Pulse rate:   63 / minute Pulse rhythm:   regular Resp:     16 per minute BP sitting:   170 / 80  (left arm) Cuff size:   xl  Vitals Entered By: Everitt Amber LPN (August 08, 2010 2:17 PM)  Nutrition Counseling: Patient's BMI is greater than 25 and therefore counseled on weight management options. CC: Follow up chronic problems   Primary Care Provider:  Dr. Syliva Overman   CC:  Follow up chronic problems.  History of Present Illness: Reports  that she is doing better than she had in the reent past. Denies recent fever or chills. Denies sinus pressure, nasal congestion , ear pain or sore throat. Denies chest congestion, or cough productive of sputum. Denies chest pain, palpitations, PND, orthopnea or significant  leg swelling. Denies abdominal pain, nausea, vomitting, diarrhea or constipation. Denies change in bowel movements or bloody stool. Denies dysuria , frequency, incontinence or hesitancy. Chronic   joint pain,  and reduced mobility. Denies headaches, vertigo, seizures. Reports improvement in her depression, she denies anxiety and insomnia. Shje is tolerating the inc dose of fluoxetine with no adverse side effects. c/o enlarging hyperpigmented calf lesion wants it checked   Current Medications (verified): 1)  Diltiazem Hcl Er Beads 240 Mg Xr24h-Cap (Diltiazem Hcl Er Beads) .... One Tab Q 12 Hrs 2)  Calcium 600 1500 Mg  Tabs (Calcium Carbonate) .... One Tab By Mouth Two Times A Day 3)  Gabapentin 300 Mg  Caps (Gabapentin) .Marland Kitchen.. 1 By Mouth Three Times A Day 4)  Proair Hfa 108 (90 Base) Mcg/act  Aers (Albuterol Sulfate) .... Inhale Two Puffs Every Six Hours As Needed 5)  Centrum Silver   Tabs (Multiple Vitamins-Minerals) .... One Tab By Mouth Once Daily 6)  Methimazole 10 Mg   Tabs (Methimazole) .... One Tab By Mouth Once Daily 7)  Pyridostigmine Bromide 60 Mg  Tabs (Pyridostigmine Bromide) .... One Tab By Mouth Three Times A Day 8)  Prednisone 5 Mg  Tabs (Prednisone) .... Take One Tablet in The Morning 9)  Glucovance 2.5-500 Mg Tabs (Glyburide-Metformin) .... 2 Tablets Twice Daily 10)  Flax Seed Oil 1000 Mg Caps (Flaxseed (Linseed)) .... One Cap By Mouth Bid 11)  Combigan 0.2-0.5 % Soln (Brimonidine Tartrate-Timolol) .... Uad 12)  Accu-Chek Compact Test Drum  Strp (Glucose Blood) .... Two Times A Day Testing 13)  Lisinopril-Hydrochlorothiazide 20-12.5 Mg Tabs (Lisinopril-Hydrochlorothiazide) .... Two Tabs By Mouth Qhs 14)  Omeprazole 20 Mg Cpdr (Omeprazole) .... One Cap By Mouth Qd 15)  Adult Aspirin Low Strength 81 Mg Tbdp (Aspirin) .... One Tab By Mouth Qd 16)  Citalopram Hydrobromide 40 Mg Tabs (Citalopram Hydrobromide) .... Take 1 Tablet By Mouth Once A Day 17)  Restasis 0.05 % Emul (Cyclosporine) .... One Drop in Each Eye Two Times A Day 18)  Nu-Iron 150 Mg Caps (Polysaccharide Iron Complex) .... Take 1 Tablet By Mouth Two Times A Day 19)  Miralax  Powd (Polyethylene Glycol 3350) .... As Needed 20)  Hydrocodone-Acetaminophen 10-660 Mg Tabs (Hydrocodone-Acetaminophen) .... Take 1 Tablet By Mouth Two Times A Day  As Needed 21)  Duragesic-25 25 Mcg/hr Pt72 (Fentanyl) .... Change Patch Every 72 Hours 22)  Oxybutynin Chloride 5 Mg Xr24h-Tab (Oxybutynin Chloride) .... Take 1 Tablet By Mouth Once A Day 23)  Ipratropium-Albuterol 0.5-2.5 (3) Mg/68ml Soln (Ipratropium-Albuterol) .... Use One Vial in Nebulizer Four Times Daily As Needed 24)  Oxybutynin Chloride 5 Mg Xr24h-Tab (Oxybutynin Chloride) .... Take 1 Tablet By Mouth Once A Day 25)  Furosemide 40 Mg Tabs (Furosemide) .... Take 1 Tablet By Mouth Two Times A Day As Needed 26)  Klor-Con M20 20 Meq Cr-Tabs (Potassium Chloride Crys Cr) .... Take 1 Tablet By Mouth Three Times A Day As Needed 27)  Nu-Iron 150 Mg Caps  (Polysaccharide Iron Complex) .... Take 1 Tablet By Mouth Two Times A Day 28)  Macrobid 100 Mg Caps (Nitrofurantoin Monohyd Macro) .... Take 1 Tablet By Mouth Once A Day 29)  Crestor 20 Mg Tabs (Rosuvastatin Calcium) .... Take 1 Tab By Mouth At Bedtime 30)  Venlafaxine Hcl 75 Mg Xr24h-Cap (Venlafaxine Hcl) .... Take 1 Capsule By Mouth Once A Day 31)  Loratadine 10 Mg Tabs (Loratadine) .... One Tab By Mouth Once Daily  Discontinue Allegra 32)  Premarin 0.625 Mg Tabs (Estrogens Conjugated) .... Take 1 Tablet By Mouth Once A Day 33)  Promethazine Hcl 25 Mg Tabs (Promethazine Hcl) .... Take 1 Tablet By Mouth Once A Day As Needed 34)  Lorazepam 0.5 Mg Tabs (Lorazepam) .... Take 1 Tablet By Mouth Once A Day As Needed  Allergies (verified): No Known Drug Allergies  Review of Systems      See HPI General:  Complains of fatigue. Eyes:  Complains of vision loss-both eyes. Psych:  Complains of anxiety and depression; denies mental problems, panic attacks, sense of great danger, suicidal thoughts/plans, thoughts of violence, and unusual visions or sounds; improved . Endo:  Denies excessive thirst and excessive urination; tests  2 to 3 times daily, missess meals and sugars get down to 70's. Heme:  Denies abnormal bruising and bleeding. Allergy:  Denies hives or rash and itching eyes.  Physical Exam  General:  Well-developedobese,in no acute distress; alert,appropriate and cooperative throughout examination HEENT: No facial asymmetry,  EOMI, No sinus tenderness, TM's Clear, oropharynx  pink and moist.   Chest: Clear to auscultation bilaterally.  CVS: S1, S2, No murmurs, No S3.   Abd: Soft, Nontender.  MS: decreased  ROM spine, hips, shoulders and knees.  Ext: No edema.   CNS: CN 2-12 intact, power tone and sensation normal throughout.   Skin: Intact, hyperpigmented lesion right calf, nor rashes.  Psych: Good eye contact, normal affect.  Memory intact, not anxious or depressed  appearing.   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 08/09/2010  Problem # 2:  GLAUCOMA (ICD-365.9) Assessment: Comment Only  Orders: Ophthalmology Referral (Ophthalmology)  Problem # 3:  HYPERTENSION (ICD-401.1) Assessment: Comment Only  Her updated  medication list for this problem includes:    Diltiazem Hcl Er Beads 240 Mg Xr24h-cap (Diltiazem hcl er beads) ..... One tab q 12 hrs    Lisinopril-hydrochlorothiazide 20-12.5 Mg Tabs (Lisinopril-hydrochlorothiazide) .Marland Kitchen..Marland Kitchen Two tabs by mouth qhs    Furosemide 40 Mg Tabs (Furosemide) .Marland Kitchen... Take 1 tablet by mouth two times a day as needed  Orders: T-Basic Metabolic Panel [id number])  BP today: 170/80, pt has normally had good BP's on same dose, has not taken med today as yet Prior BP: 142/64 (06/27/2010)  Labs Reviewed: K+: 4.8 (06/27/2010) Creat: : 1.14 (06/27/2010)   Chol: 239 (03/30/2010)   HDL: 56 (03/30/2010)   LDL: 160 (03/30/2010)   TG: 116 (03/30/2010)  Problem # 4:  HYPERLIPIDEMIA (ICD-272.4) Assessment: Comment Only  Her updated medication list for this problem includes:    Crestor 20 Mg Tabs (Rosuvastatin calcium) .Marland Kitchen... Take 1 tab by mouth at bedtime, low fat diet stressed  Orders: T-Basic Metabolic Panel [phone])  Labs Reviewed: SGOT: 13 (03/30/2010)   SGPT: 10 (03/30/2010)   HDL:56 (03/30/2010), 58 (09/21/2009)  LDL:160 (03/30/2010), 88 (29/56/2130)  Chol:239 (03/30/2010), 166 (09/21/2009)  Trig:116 (03/30/2010), 102 (09/21/2009)  Problem # 5:  DIABETES  (ICD-250.00) Assessment: Improved  Her updated medication list for this problem includes:    Glucovance 2.5-500 Mg Tabs (Glyburide-metformin) .Marland Kitchen... 2 tablets twice daily    Lisinopril-hydrochlorothiazide 20-12.5 Mg Tabs (Lisinopril-hydrochlorothiazide) .Marland Kitchen..Marland Kitchen Two tabs by mouth qhs    Adult Aspirin Low Strength 81 Mg Tbdp (Aspirin) ..... One tab by mouth qd  Orders: T- Hemoglobin A1C ([id number]) T-Urine Microalbumin w/creat. ratio ([id number])Future Orders: Dermatology Referral (Derma) ... 08/09/2010  Labs Reviewed: Creat: 1.14 (06/27/2010)    Reviewed HgBA1c results: 7.1 (06/27/2010)  7.3 (03/30/2010)  Problem # 6:  DEPRESSION (ICD-311) Assessment: Improved  Her updated medication list for this problem includes:    Citalopram Hydrobromide 40 Mg Tabs (Citalopram hydrobromide) .Marland Kitchen... Take 1 tablet by mouth once a day    Venlafaxine Hcl 75 Mg Xr24h-cap (Venlafaxine hcl) .Marland Kitchen... Take 1 capsule by mouth once a day    Lorazepam 0.5 Mg Tabs (Lorazepam) .Marland Kitchen... Take 1 tablet by mouth once a day as needed  Complete Medication List: 1)  Diltiazem Hcl Er Beads 240 Mg Xr24h-cap (Diltiazem hcl er beads) .... One tab q 12 hrs 2)  Calcium 600 1500 Mg Tabs (Calcium carbonate) .... One tab by mouth two times a day 3)  Gabapentin 300 Mg Caps (gabapentin)  .Marland Kitchen.. 1 by mouth three times a day 4)  Proair Hfa 108 (90 Base) Mcg/act Aers (Albuterol sulfate) .... Inhale two puffs every six hours as needed 5)  Centrum Silver Tabs (Multiple vitamins-minerals) .... One tab by mouth once daily 6)  Methimazole 10 Mg Tabs (Methimazole) .... One tab by mouth once daily 7)  Pyridostigmine Bromide 60 Mg Tabs (Pyridostigmine bromide) .... One tab by mouth three times a day 8)  Prednisone 5 Mg Tabs (Prednisone) .... Take one tablet in the morning 9)  Glucovance 2.5-500 Mg Tabs (Glyburide-metformin) .... 2 tablets twice daily 10)  Flax Seed Oil 1000 Mg Caps (Flaxseed (linseed)) .... One cap by mouth bid 11)   Combigan 0.2-0.5 % Soln (Brimonidine tartrate-timolol) .... Uad 12)  Accu-chek Compact Test Drum Strp (Glucose blood) .... Two times a day testing 13)  Lisinopril-hydrochlorothiazide 20-12.5 Mg Tabs (Lisinopril-hydrochlorothiazide) .... Two tabs by mouth qhs 14)  Omeprazole 20 Mg Cpdr (Omeprazole) .... One cap by mouth qd 15)  Adult Aspirin Low Strength 81 Mg Tbdp (Aspirin) .... One tab  by mouth qd 16)  Citalopram Hydrobromide 40 Mg Tabs (Citalopram hydrobromide) .... Take 1 tablet by mouth once a day 17)  Restasis 0.05 % Emul (Cyclosporine) .... One drop in each eye two times a day 18)  Nu-iron 150 Mg Caps (Polysaccharide iron complex) .... Take 1 tablet by mouth two times a day 19)  Miralax Powd (Polyethylene glycol 3350) .... As needed 20)  Hydrocodone-acetaminophen 10-660 Mg Tabs (Hydrocodone-acetaminophen) .... Take 1 tablet by mouth two times a day  as needed 21)  Duragesic-25 25 Mcg/hr Pt72 (Fentanyl) .... Change patch every 72 hours 22)  Oxybutynin Chloride 5 Mg Xr24h-tab (Oxybutynin chloride) .... Take 1 tablet by mouth once a day 23)  Ipratropium-albuterol 0.5-2.5 (3) Mg/76ml Soln (Ipratropium-albuterol) .... Use one vial in nebulizer four times daily as needed 24)  Oxybutynin Chloride 5 Mg Xr24h-tab (Oxybutynin chloride) .... Take 1 tablet by mouth once a day 25)  Furosemide 40 Mg Tabs (Furosemide) .... Take 1 tablet by mouth two times a day as needed 26)  Klor-con M20 20 Meq Cr-tabs (Potassium chloride crys cr) .... Take 1 tablet by mouth three times a day as needed 27)  Nu-iron 150 Mg Caps (Polysaccharide iron complex) .... Take 1 tablet by mouth two times a day 28)  Macrobid 100 Mg Caps (Nitrofurantoin monohyd macro) .... Take 1 tablet by mouth once a day 29)  Crestor 20 Mg Tabs (Rosuvastatin calcium) .... Take 1 tab by mouth at bedtime 30)  Venlafaxine Hcl 75 Mg Xr24h-cap (Venlafaxine hcl) .... Take 1 capsule by mouth once a day 31)  Loratadine 10 Mg Tabs (Loratadine) .... One tab  by mouth once daily  discontinue allegra 32)  Premarin 0.625 Mg Tabs (Estrogens conjugated) .... Take 1 tablet by mouth once a day 33)  Promethazine Hcl 25 Mg Tabs (Promethazine hcl) .... Take 1 tablet by mouth once a day as needed 34)  Lorazepam 0.5 Mg Tabs (Lorazepam) .... Take 1 tablet by mouth once a day as needed  Other Orders: T-Hepatic Function [phone]) T-Lipid Profile [phone]) T-TSH [phone])  Patient Instructions: 1)  F/U  2nd week in october. 2)  you will be referred to Dr Forrestine Him to the dermatologist 3)  BMP prior to visit, ICD-9: 4)  Hepatic Panel prior to visit, ICD-9:  fasting labs 2nd week in october 5)  Lipid Panel prior to visit, ICD-9: 6)  TSH prior to visit, ICD-9: 7)  HbgA1C prior to visit, ICD-9: Prescriptions: DURAGESIC-25 25 MCG/HR PT72 (FENTANYL) change patch every 72 hours  #10 x 0   Entered by:   Everitt Amber LPN   Authorized by:   Syliva Overman MD   Signed by:   Everitt Amber LPN on 57/84/6962   Method used:   Handwritten   RxID:   [id number] OXYBUTYNIN CHLORIDE 5 MG XR24H-TAB (OXYBUTYNIN CHLORIDE) Take 1 tablet by mouth once a day  #30 x 0   Entered by:   Everitt Amber LPN   Authorized by:   Syliva Overman MD   Signed by:   Everitt Amber LPN on 53/66/4403   Method used:   Handwritten   RxID:   [id number]

## 2011-02-01 NOTE — Progress Notes (Signed)
Summary: DR. Kristian Covey  DR. Kristian Covey   Imported By: Lind Guest 03/27/2010 14:58:19  _____________________________________________________________________  External Attachment:    Type:   Image     Comment:   External Document

## 2011-02-01 NOTE — Letter (Signed)
Summary: [facility]  [facility]   Imported By: Lind Guest 08/29/2010 08:37:52  _____________________________________________________________________  External Attachment:    Type:   Image     Comment:   External Document

## 2011-02-01 NOTE — Assessment & Plan Note (Signed)
Summary: office visit   Vital Signs:  Patient profile:   70 year old female Menstrual status:  postmenopausal Height:      66 inches Weight:      278.75 pounds BMI:     45.15 O2 Sat:      89 % on Room air Pulse rate:   77 / minute Resp:     16 per minute BP sitting:   138 / 70  (left arm)  Vitals Entered By: Mauricia Area, CMA  Nutrition Counseling: Patient's BMI is greater than 25 and therefore counseled on weight management options.  O2 Flow:  Room air CC: follow up. baack and shoulder pain  1  Primary Care [name]:  Dr. Syliva Overman   CC:  follow up. baack and shoulder pain.  History of Present Illness: Reports  that she uis doing fairly well. She states howeverthat her back and shoulder pain are uncontrolled and increased, she ias requesting dose adjustment of her meds. She also reports little to no improvement of her urinary incontinence des[pite a course of therapy thru urology, she is interested in otheroptions and has an upcoming appt with urology. Denies recent fever or chills. Denies sinus pressure, nasal congestion , ear pain or sore throat. Denies chest congestion, or cough productive of sputum. Denies chest pain, palpitations, PND, orthopnea or leg swelling. Denies abdominal pain, nausea, vomitting, diarrhea or constipation. Denies change in bowel movements or bloody stool. Denies dysuria ,  or hesitancy.  Denies headaches, vertigo, seizures. Denies depression, anxiety or insomnia.Reports fairly good control on current meds. Denies  rash, lesions, or itch.     Allergies (verified): No Known Drug Allergies  Review of Systems      See HPI General:  Complains of fatigue. GU:  Complains of incontinence; no significant improvement noted despite prolonged treatment by urology has upcoming appt with mD. MS:  Complains of joint pain, low back pain, mid back pain, and stiffness; uncontrolled and increased back and shoulder pain, requests dose increase on  meds. Derm:  Denies itching and rash. Endo:  Denies excessive thirst and excessive urination. Heme:  Denies abnormal bruising and bleeding. Allergy:  Complains of seasonal allergies; denies hives or rash and itching eyes.  Physical Exam  General:  Well-developedobese,in no acute distress; alert,appropriate and cooperative throughout examination HEENT: No facial asymmetry,  EOMI, No sinus tenderness, TM's Clear, oropharynx  pink and moist.   Chest: Clear to auscultation bilaterally.  CVS: S1, S2, No murmurs, No S3.   Abd: Soft, Nontender.  MS: decreased  ROM spine, hips, shoulders and knees.  Ext: No edema.   CNS: CN 2-12 intact, power tone and sensation normal throughout.   Skin: Intact, hyperpigmented lesion right calf, nor rashes.  Psych: Good eye contact, normal affect.  Memory intact, not anxious or depressed appearing.    Impression & Recommendations:  Problem # 1:  HYPERTENSION (ICD-401.1) Assessment Improved  The following medications were removed from the medication list:    Diltiazem Hcl Er Beads 240 Mg Xr24h-cap (Diltiazem hcl er beads) ..... One tab q 12 hrs    Lisinopril-hydrochlorothiazide 20-12.5 Mg Tabs (Lisinopril-hydrochlorothiazide) .Marland Kitchen..Marland Kitchen Two tabs by mouth qhs Her updated medication list for this problem includes:    Furosemide 40 Mg Tabs (Furosemide) .Marland Kitchen... Take 1 tablet by mouth two times a day as needed    Diltiazem Hcl Coated Beads 240 Mg Xr24h-cap (Diltiazem hcl coated beads) .Marland Kitchen... Take 1 tablet by mouth once a day    Lisinopril-hydrochlorothiazide 20-12.5 Mg Tabs (  Lisinopril-hydrochlorothiazide) .Marland Kitchen..Marland Kitchen Two tablets once daily  Orders: T-Basic Metabolic Panel ([id number])  BP today: 138/70 Prior BP: 170/80 (08/08/2010)  Labs Reviewed: K+: 4.8 (06/27/2010) Creat: : 1.14 (06/27/2010)   Chol: 239 (03/30/2010)   HDL: 56 (03/30/2010)   LDL: 160 (03/30/2010)   TG: 116 (03/30/2010)  Problem # 2:  HYPERLIPIDEMIA (ICD-272.4) Assessment: Comment Only  Her  updated medication list for this problem includes:    Crestor 20 Mg Tabs (Rosuvastatin calcium) .Marland Kitchen... Take 1 tab by mouth at bedtime Low fat dietdiscussed and encouraged  Orders: T-Lipid Profile [phone]) T-Hepatic Function ([phone])  Labs Reviewed: SGOT: 13 (03/30/2010)   SGPT: 10 (03/30/2010)   HDL:56 (03/30/2010), 58 (09/21/2009)  LDL:160 (03/30/2010), 88 (02/72/5366)  Chol:239 (03/30/2010), 166 (09/21/2009)  Trig:116 (03/30/2010), 102 (09/21/2009)  Problem # 3:  DIABETES (ICD-250.00) Assessment: Comment Only  The following medications were removed from the medication list:    Lisinopril-hydrochlorothiazide 20-12.5 Mg Tabs (Lisinopril-hydrochlorothiazide) .Marland Kitchen..Marland Kitchen Two tabs by mouth qhs Her updated medication list for this problem includes:    Glucovance 2.5-500 Mg Tabs (Glyburide-metformin) .Marland Kitchen... 2 tablets twice daily    Adult Aspirin Low Strength 81 Mg Tbdp (Aspirin) ..... One tab by mouth qd    Lisinopril-hydrochlorothiazide 20-12.5 Mg Tabs (Lisinopril-hydrochlorothiazide) .Marland Kitchen..Marland Kitchen Two tablets once daily Patient advised to reduce carbs and sweets, commit to regular physical activity, take meds as prescribed, test blood sugars as directed, and attempt to lose weight , to improve blood sugar control.  Orders: T- Hemoglobin A1C ([id number])  Labs Reviewed: Creat: 1.14 (06/27/2010)    Reviewed HgBA1c results: 7.1 (06/27/2010)  7.3 (03/30/2010)  Problem # 4:  HYPOTHYROIDISM (ICD-244.9) Assessment: Comment Only  Labs Reviewed: TSH: 4.284 (03/30/2010)    HgBA1c: 7.1 (06/27/2010) Chol: 239 (03/30/2010)   HDL: 56 (03/30/2010)   LDL: 160 (03/30/2010)   TG: 116 (03/30/2010)  Problem # 5:  OBESITY, UNSPECIFIED (ICD-278.00) Assessment: Deteriorated  Ht: 66 (10/10/2010)   Wt: 278.75 (10/10/2010)   BMI: 45.15 (10/10/2010) therapeutic lifestyle change discussed and encouraged  Problem # 6:  UNSPECIFIED URINARY INCONTINENCE (ICD-788.30) Assessment: Deteriorated increased dose of  oxybutynin, will check with opthal; that this is safe for the pt to take also  Problem # 7:  BACK PAIN (ICD-724.5) Assessment: Deteriorated  The following medications were removed from the medication list:    Hydrocodone-acetaminophen 10-660 Mg Tabs (Hydrocodone-acetaminophen) .Marland Kitchen... Take 1 tablet by mouth two times a day  as needed    Duragesic-25 25 Mcg/hr Pt72 (Fentanyl) .Marland Kitchen... Change patch every 72 hours Her updated medication list for this problem includes:    Adult Aspirin Low Strength 81 Mg Tbdp (Aspirin) ..... One tab by mouth qd    Hydrocodone-acetaminophen 10-500 Mg Tabs (Hydrocodone-acetaminophen) .Marland Kitchen... Take 1 tablet by mouth three times a day  Complete Medication List: 1)  Calcium 600 1500 Mg Tabs (Calcium carbonate) .... One tab by mouth two times a day 2)  Gabapentin 300 Mg Caps (gabapentin)  .Marland Kitchen.. 1 by mouth three times a day 3)  Proair Hfa 108 (90 Base) Mcg/act Aers (Albuterol sulfate) .... Inhale two puffs every six hours as needed 4)  Centrum Silver Tabs (Multiple vitamins-minerals) .... One tab by mouth once daily 5)  Methimazole 10 Mg Tabs (Methimazole) .... One tab by mouth once daily 6)  Pyridostigmine Bromide 60 Mg Tabs (Pyridostigmine bromide) .... One tab by mouth three times a day 7)  Prednisone 5 Mg Tabs (Prednisone) .... Take one tablet in the morning 8)  Glucovance 2.5-500 Mg Tabs (Glyburide-metformin) .... 2 tablets twice daily  9)  Combigan 0.2-0.5 % Soln (Brimonidine tartrate-timolol) .... Uad 10)  Accu-chek Compact Test Drum Strp (Glucose blood) .... Two times a day testing 11)  Adult Aspirin Low Strength 81 Mg Tbdp (Aspirin) .... One tab by mouth qd 12)  Citalopram Hydrobromide 40 Mg Tabs (Citalopram hydrobromide) .... Take 1 tablet by mouth once a day 13)  Restasis 0.05 % Emul (Cyclosporine) .... One drop in each eye two times a day 14)  Nu-iron 150 Mg Caps (Polysaccharide iron complex) .... Take 1 tablet by mouth two times a day 15)  Miralax Powd  (Polyethylene glycol 3350) .... As needed 16)  Ipratropium-albuterol 0.5-2.5 (3) Mg/21ml Soln (Ipratropium-albuterol) .... Use one vial in nebulizer four times daily as needed 17)  Oxybutynin Chloride 5 Mg Xr24h-tab (Oxybutynin chloride) .... Take 1 tablet by mouth once a day 18)  Furosemide 40 Mg Tabs (Furosemide) .... Take 1 tablet by mouth two times a day as needed 19)  Klor-con M20 20 Meq Cr-tabs (Potassium chloride crys cr) .... Take 1 tablet by mouth three times a day as needed 20)  Nu-iron 150 Mg Caps (Polysaccharide iron complex) .... Take 1 tablet by mouth two times a day 21)  Macrobid 100 Mg Caps (Nitrofurantoin monohyd macro) .... Take 1 tablet by mouth once a day 22)  Crestor 20 Mg Tabs (Rosuvastatin calcium) .... Take 1 tab by mouth at bedtime 23)  Venlafaxine Hcl 75 Mg Xr24h-cap (Venlafaxine hcl) .... Take 1 capsule by mouth once a day 24)  Loratadine 10 Mg Tabs (Loratadine) .... One tab by mouth once daily  discontinue allegra 25)  Premarin 0.625 Mg Tabs (Estrogens conjugated) .... Take 1 tablet by mouth once a day 26)  Promethazine Hcl 25 Mg Tabs (Promethazine hcl) .... Take 1 tablet by mouth once a day as needed 27)  Lorazepam 0.5 Mg Tabs (Lorazepam) .... Take 1 tablet by mouth once a day as needed 28)  Oxybutynin Chloride 10 Mg Xr24h-tab (Oxybutynin chloride) .... Take 1 tablet by mouth once a day 29)  Diltiazem Hcl Coated Beads 240 Mg Xr24h-cap (Diltiazem hcl coated beads) .... Take 1 tablet by mouth once a day 30)  Omeprazole 20 Mg Cpdr (Omeprazole) .... Take 1 capsule by mouth once a day 31)  Lisinopril-hydrochlorothiazide 20-12.5 Mg Tabs (Lisinopril-hydrochlorothiazide) .... Two tablets once daily 32)  Th Flax Seed Oil 1000 Mg Caps (Flaxseed (linseed)) .... Take 1 capsule by mouth once a day 33)  Hydrocodone-acetaminophen 10-500 Mg Tabs (Hydrocodone-acetaminophen) .... Take 1 tablet by mouth three times a day  Patient Instructions: 1)  Please schedule a follow-up appointment  in 3 months. 2)  BMP prior to visit, ICD-9: 3)  Hepatic Panel prior to visit, ICD-9: 4)  Lipid Panel prior to visit, ICD-9:   past due pls doasap 5)  HbgA1C prior to visit, ICD-9: 6)  Meds as discussed. 7)  Once you finish this fentanylpatch, no more, change to higher dose and new preparation of hydrocodone. 8)  take tWO oxybutynin 5mg  tabs once daily till done, new dose is 10mg  once daily 9)  The medication list was reviewed and reconciled..All changed/newly prescribed medications were explained. A complete medication list was provided to the patient/caregiver.  Prescriptions: OMEPRAZOLE 20 MG CPDR (OMEPRAZOLE) Take 1 capsule by mouth once a day  #30 x 3   Entered by:   Adella Hare LPN   Authorized by:   Syliva Overman MD   Signed by:   Adella Hare LPN on 25/36/6440   Method used:  Electronically to        [facility] (retail)       [address]       [place], [place]  [address]       Ph: [phone]       Fax: [phone]   RxID:   [id number] LISINOPRIL-HYDROCHLOROTHIAZIDE 20-12.5 MG TABS (LISINOPRIL-HYDROCHLOROTHIAZIDE) two tablets once daily  #60 x 3   Entered by:   Adella Hare LPN   Authorized by:   Syliva Overman MD   Signed by:   Adella Hare LPN on 84/13/2440   Method used:   Electronically to        [facility] (retail)       [address]       [place], [place]  [address]       Ph: [phone]       Fax: [phone]   RxID:   [id number] HYDROCODONE-ACETAMINOPHEN 10-500 MG TABS (HYDROCODONE-ACETAMINOPHEN) Take 1 tablet by mouth three times a day  #90 x 3   Entered and Authorized by:   Syliva Overman MD   Signed by:   Syliva Overman MD on 10/10/2010   Method used:   Printed then faxed to ...       [facility] (retail)       [address]       [place], [place]  [address]       Ph: [phone]       Fax: [phone]   RxID:    [id number] DILTIAZEM HCL COATED BEADS 240 MG XR24H-CAP (DILTIAZEM HCL COATED BEADS) Take 1 tablet by mouth once a day  #30 x 4   Entered and Authorized by:   Syliva Overman MD   Signed by:   Syliva Overman MD on 10/10/2010   Method used:   Historical   RxID:   [id number] OXYBUTYNIN CHLORIDE 10 MG XR24H-TAB (OXYBUTYNIN CHLORIDE) Take 1 tablet by mouth once a day  #30 x 3   Entered and Authorized by:   Syliva Overman MD   Signed by:   Syliva Overman MD on 10/10/2010   Method used:   Printed then faxed to ...       [facility] (retail)       [address]       [place], [place]  [address]       Ph: [phone]       Fax: [phone]   RxID:   [id number]   Appended Document: office visit pls fax the letter to Dr Melene Muller, opthalmologist, and ask that he provide a response, he may come the phone if this is more convenient, I need to know about medication safety for this pt pls  Appended Document: office visit I faxed the note over and left message for dr. Harlon Flor to give you a call.

## 2011-02-01 NOTE — Letter (Signed)
Summary: dr. Melene Muller  dr. whittaker   Imported By: Lind Guest 10/26/2010 12:45:06  _____________________________________________________________________  External Attachment:    Type:   Image     Comment:   External Document

## 2011-02-01 NOTE — Medication Information (Signed)
Summary: Tax adviser   Imported By: Lind Guest 02/16/2010 13:04:42  _____________________________________________________________________  External Attachment:    Type:   Image     Comment:   External Document

## 2011-02-01 NOTE — Progress Notes (Signed)
  Phone Note From Pharmacy   Caller: [facility]* Summary of Call: patient requesting refill on hydrocodone/apap 10-660mg  one two times a day as needed  Initial call taken by: Adella Hare LPN,  March 06, 2010 1:29 PM  Follow-up for Phone Call        pls refill x3 Follow-up by: Syliva Overman MD,  March 07, 2010 5:34 AM    Prescriptions: HYDROCODONE-ACETAMINOPHEN 10-660 MG TABS (HYDROCODONE-ACETAMINOPHEN) Take 1 tablet by mouth two times a day  as needed  #60 x 2   Entered by:   Adella Hare LPN   Authorized by:   Syliva Overman MD   Signed by:   Adella Hare LPN on 84/13/2440   Method used:   Printed then faxed to ...       [facility]* (retail)       [address]       [place], [place]  [address]       Ph: [phone] or [phone]       Fax: [phone]   RxID:   [id number]

## 2011-02-01 NOTE — Miscellaneous (Signed)
Summary: NARC REFILL  Clinical Lists Changes  Medications: Rx of DURAGESIC-25 25 MCG/HR PT72 (FENTANYL) change patch every 72 hours;  #10 x 0;  Signed;  Entered by: Everitt Amber LPN;  Authorized by: Syliva Overman MD;  Method used: Handwritten    Prescriptions: DURAGESIC-25 25 MCG/HR PT72 (FENTANYL) change patch every 72 hours  #10 x 0   Entered by:   Everitt Amber LPN   Authorized by:   Syliva Overman MD   Signed by:   Everitt Amber LPN on 16/10/9603   Method used:   Handwritten   RxID:   [id number]

## 2011-02-01 NOTE — Progress Notes (Signed)
Summary: MEDICINE  Phone Note Call from Patient   Summary of Call: SOMEONE HAS TOOK HER OFF HER CHOL. MEDICINE AND SSHE IS SWELLING AGAIN  CALL BACK AT [phone]  OR  CELL # [phone] Initial call taken by: Lind Guest,  April 24, 2010 3:03 PM  Follow-up for Phone Call        she should bnbe on simvastatin per her med list for chol and this would not make her swell, check if she means furosemide and zaroxolyn, i do not see that she is on ither she needs tatleast the furosemide , find out what she is asking about Follow-up by: Syliva Overman MD,  April 24, 2010 5:27 PM  Additional Follow-up for Phone Call Additional follow up Details #1::        She started the simvastatin back a couple weeks ago and has been swelling again. She didn't need the fluid pill while she was off it but now she is swelling again and she knows its the cholesterol pill. What do you want her to do? Resume the lasix and stay on the simvastatin?  Additional Follow-up by: Everitt Amber LPN,  April 25, 2010 10:43 AM    Additional Follow-up for Phone Call Additional follow up Details #2::    yes lasix 40mg  one twice daily as needed an potassium chloride one thrree tims daily as needede Follow-up by: Syliva Overman MD,  April 25, 2010 11:28 AM  Additional Follow-up for Phone Call Additional follow up Details #3:: Details for Additional Follow-up Action Taken: meds sent in. Called patient and left message to call back  Additional Follow-up by: Everitt Amber LPN,  April 25, 2010 1:43 PM  New/Updated Medications: FUROSEMIDE 40 MG TABS (FUROSEMIDE) Take 1 tablet by mouth two times a day as needed KLOR-CON M20 20 MEQ CR-TABS (POTASSIUM CHLORIDE CRYS CR) Take 1 tablet by mouth three times a day as needed Prescriptions: KLOR-CON M20 20 MEQ CR-TABS (POTASSIUM CHLORIDE CRYS CR) Take 1 tablet by mouth three times a day as needed  #90 x 2   Entered by:   Everitt Amber LPN   Authorized by:   Syliva Overman MD   Signed  by:   Everitt Amber LPN on 95/62/1308   Method used:   Electronically to        [facility]* (retail)       [address]       [place], [place]  [address]       Ph: [phone] or [phone]       Fax: [phone]   RxID:   [id number] FUROSEMIDE 40 MG TABS (FUROSEMIDE) Take 1 tablet by mouth two times a day as needed  #60 x 2   Entered by:   Everitt Amber LPN   Authorized by:   Syliva Overman MD   Signed by:   Everitt Amber LPN on 51/88/4166   Method used:   Electronically to        [facility]* (retail)       [address]       [place], [place]  [address]       Ph: [phone] or [phone]       Fax: [phone]   RxID:   [id number]  Patient aware

## 2011-02-01 NOTE — Miscellaneous (Signed)
Summary: NARC REFILL  Clinical Lists Changes  Medications: Rx of DURAGESIC-25 25 MCG/HR PT72 (FENTANYL) change patch every 72 hours;  #10 x 0;  Signed;  Entered by: Everitt Amber LPN;  Authorized by: Syliva Overman MD;  Method used: Handwritten    Prescriptions: DURAGESIC-25 25 MCG/HR PT72 (FENTANYL) change patch every 72 hours  #10 x 0   Entered by:   Everitt Amber LPN   Authorized by:   Syliva Overman MD   Signed by:   Everitt Amber LPN on 44/12/270   Method used:   Handwritten   RxID:   [id number]

## 2011-02-01 NOTE — Progress Notes (Signed)
[facility]   Imported By: Lind Guest 04/11/2010 13:51:07  _____________________________________________________________________  External Attachment:    Type:   Image     Comment:   External Document

## 2011-02-01 NOTE — Miscellaneous (Signed)
Summary: NARC REFILL  Clinical Lists Changes  Medications: Rx of DURAGESIC-25 25 MCG/HR PT72 (FENTANYL) change patch every 72 hours;  #10 x 0;  Signed;  Entered by: Everitt Amber LPN;  Authorized by: Syliva Overman MD;  Method used: Handwritten    Prescriptions: DURAGESIC-25 25 MCG/HR PT72 (FENTANYL) change patch every 72 hours  #10 x 0   Entered by:   Everitt Amber LPN   Authorized by:   Syliva Overman MD   Signed by:   Everitt Amber LPN on 02/72/5366   Method used:   Handwritten   RxID:   [id number]

## 2011-02-01 NOTE — Medication Information (Signed)
Summary: Tax adviser   Imported By: Lind Guest 09/18/2010 14:22:18  _____________________________________________________________________  External Attachment:    Type:   Image     Comment:   External Document

## 2011-02-01 NOTE — Assessment & Plan Note (Signed)
Summary: office visit   Vital Signs:  Patient profile:   70 year old female Menstrual status:  postmenopausal Height:      66 inches Weight:      278 pounds BMI:     45.03 O2 Sat:      96 % Pulse rate:   63 / minute Pulse rhythm:   regular Resp:     16 per minute BP sitting:   150 / 76  (left arm) Cuff size:   xl  Vitals Entered By: Everitt Amber LPN (January 10, 2011 4:08 PM)  Nutrition Counseling: Patient's BMI is greater than 25 and therefore counseled on weight management options. CC: Follow up chronic problems,    Primary Care Provider:  Dr. Syliva Overman   CC:  Follow up chronic problems and .  History of Present Illness: Reports  that she has not been doing very well. Gaynelle Adu states she is very depressed, just does not seem to be able o shake it. no specific trigger.nort suicidal or homicidal Denies recent fever or chills. Denies sinus pressure, nasal congestion , ear pain or sore throat. Denies chest congestion, or cough productive of sputum. Denies chest pain, palpitations, PND, orthopnea or leg swelling. Denies abdominal pain, nausea, vomitting, diarrhea or constipation. Denies change in bowel movements or bloody stool. Denies dysuria , frequency, incontinence or hesitancy.  Denies headaches, vertigo, seizures.  Denies  rash, lesions, or itch.     Current Medications (verified): 1)  Calcium 600 1500 Mg  Tabs (Calcium Carbonate) .... One Tab By Mouth Two Times A Day 2)  Gabapentin 300 Mg  Caps (Gabapentin) .Marland Kitchen.. 1 By Mouth Three Times A Day 3)  Proair Hfa 108 (90 Base) Mcg/act  Aers (Albuterol Sulfate) .... Inhale Two Puffs Every Six Hours As Needed 4)  Centrum Silver   Tabs (Multiple Vitamins-Minerals) .... One Tab By Mouth Once Daily 5)  Methimazole 10 Mg  Tabs (Methimazole) .... One Tab By Mouth Once Daily 6)  Pyridostigmine Bromide 60 Mg  Tabs (Pyridostigmine Bromide) .... One Tab By Mouth Three Times A Day 7)  Prednisone 5 Mg  Tabs (Prednisone) .... Take  One Tablet in The Morning 8)  Glucovance 2.5-500 Mg Tabs (Glyburide-Metformin) .... 2 Tablets Twice Daily 9)  Combigan 0.2-0.5 % Soln (Brimonidine Tartrate-Timolol) .... Uad 10)  Accu-Chek Compact Test Drum  Strp (Glucose Blood) .... Two Times A Day Testing 11)  Adult Aspirin Low Strength 81 Mg Tbdp (Aspirin) .... One Tab By Mouth Qd 12)  Citalopram Hydrobromide 40 Mg Tabs (Citalopram Hydrobromide) .... Take 1 Tablet By Mouth Once A Day 13)  Restasis 0.05 % Emul (Cyclosporine) .... One Drop in Each Eye Two Times A Day 14)  Nu-Iron 150 Mg Caps (Polysaccharide Iron Complex) .... Take 1 Tablet By Mouth Two Times A Day 15)  Miralax  Powd (Polyethylene Glycol 3350) .... As Needed 16)  Ipratropium-Albuterol 0.5-2.5 (3) Mg/88ml Soln (Ipratropium-Albuterol) .... Use One Vial in Nebulizer Four Times Daily As Needed 17)  Furosemide 40 Mg Tabs (Furosemide) .... Take 1 Tablet By Mouth Two Times A Day As Needed 18)  Klor-Con M20 20 Meq Cr-Tabs (Potassium Chloride Crys Cr) .... Take 1 Tablet By Mouth Three Times A Day As Needed 19)  Macrobid 100 Mg Caps (Nitrofurantoin Monohyd Macro) .... Take 1 Tablet By Mouth Once A Day 20)  Crestor 20 Mg Tabs (Rosuvastatin Calcium) .... Take 1 Tab By Mouth At Bedtime 21)  Venlafaxine Hcl 75 Mg Xr24h-Cap (Venlafaxine Hcl) .... Take 1 Capsule  By Mouth Once A Day 22)  Loratadine 10 Mg Tabs (Loratadine) .... One Tab By Mouth Once Daily  Discontinue Allegra 23)  Premarin 0.625 Mg Tabs (Estrogens Conjugated) .... Take 1 Tablet By Mouth Once A Day 24)  Promethazine Hcl 25 Mg Tabs (Promethazine Hcl) .... Take 1 Tablet By Mouth Once A Day As Needed 25)  Lorazepam 0.5 Mg Tabs (Lorazepam) .... Take 1 Tablet By Mouth Once A Day As Needed 26)  Oxybutynin Chloride 10 Mg Xr24h-Tab (Oxybutynin Chloride) .... Take 1 Tablet By Mouth Once A Day 27)  Diltiazem Hcl Coated Beads 240 Mg Xr24h-Cap (Diltiazem Hcl Coated Beads) .... Take 1 Tablet By Mouth Once A Day 28)  Omeprazole 20 Mg Cpdr  (Omeprazole) .... Take 1 Capsule By Mouth Once A Day 29)  Lisinopril-Hydrochlorothiazide 20-12.5 Mg Tabs (Lisinopril-Hydrochlorothiazide) .... Two Tablets Once Daily 30)  Th Flax Seed Oil 1000 Mg Caps (Flaxseed (Linseed)) .... Take 1 Capsule By Mouth Once A Day 31)  Hydrocodone-Acetaminophen 10-500 Mg Tabs (Hydrocodone-Acetaminophen) .... Take 1 Tablet By Mouth Three Times A Day 32)  Fluticasone Propionate 50 Mcg/act Susp (Fluticasone Propionate) .... One To Two Puffs Per Nostril Dally As Needed For Uncontrolled Allergies  Allergies (verified): No Known Drug Allergies  Review of Systems      See HPI General:  Complains of fatigue, malaise, and sleep disorder. Eyes:  Complains of vision loss-both eyes. GI:  Complains of nausea; 1 month h/o inc nausea, does not vomit, also food sticking at lower esophagus, also liqids. GU:  Denies dysuria and urinary frequency. MS:  Complains of joint pain, low back pain, mid back pain, and stiffness. Derm:  Complains of lesion(s); recurrent right ingrown toenail, not infected but painful x 1 month, tight foot is flat . Neuro:  Complains of visual disturbances and weakness; denies falling down, headaches, and seizures. Psych:  Complains of anxiety, depression, irritability, and mental problems; denies suicidal thoughts/plans, thoughts of violence, and unusual visions or sounds. Endo:  Denies cold intolerance, excessive hunger, excessive thirst, excessive urination, and heat intolerance. Heme:  Denies abnormal bruising and bleeding. Allergy:  Complains of seasonal allergies.  Physical Exam  General:  Well-developed,obese,in no acute distress; alert,appropriate and cooperative throughout examination HEENT: No facial asymmetry,  EOMI, No sinus tenderness, TM's Clear, oropharynx  pink and moist.   Chest: Clear to auscultation bilaterally.  CVS: S1, S2, No murmurs, No S3.   Abd: Soft, Nontender.  FA:OZHYQMVHQ ROM spine, hips, shoulders and knees.  Ext: No  edema.   CNS: CN 2-12 intact, power tone and sensation normal throughout.   Skin: Intact, no visible lesions or rashes.  Psych: Good eye contact, flat affect.  Memory intact, depressed appearing.    Impression & Recommendations:  Problem # 1:  INGROWN TOENAIL (ICD-703.0) Assessment Comment Only  Orders: Podiatry Referral (Podiatry)  Problem # 2:  DYSPHAGIA UNSPECIFIED (ICD-787.20) Assessment: Deteriorated  Orders: Gastroenterology Referral (GI), pt has myasthenia,but has not been following with neurology regularly.   Problem # 3:  BACK PAIN (ICD-724.5) Assessment: Unchanged  Her updated medication list for this problem includes:    Adult Aspirin Low Strength 81 Mg Tbdp (Aspirin) ..... One tab by mouth qd    Hydrocodone-acetaminophen 10-500 Mg Tabs (Hydrocodone-acetaminophen) .Marland Kitchen... Take 1 tablet by mouth three times a day  Problem # 4:  GLAUCOMA (ICD-365.9) Assessment: Comment Only follows regularly with opthalmology  Problem # 5:  HYPERTENSION (ICD-401.1) Assessment: Deteriorated  Her updated medication list for this problem includes:    Furosemide 40  Mg Tabs (Furosemide) .Marland Kitchen... Take 1 tablet by mouth two times a day as needed    Diltiazem Hcl Coated Beads 240 Mg Xr24h-cap (Diltiazem hcl coated beads) .Marland Kitchen... Take 1 tablet by mouth once a day    Lisinopril-hydrochlorothiazide 20-12.5 Mg Tabs (Lisinopril-hydrochlorothiazide) .Marland Kitchen..Marland Kitchen Two tablets once daily  Orders: T-Basic Metabolic Panel [phone])  BP today: 150/76 Prior BP: 138/70 (10/10/2010) Patient advised to follow low sodium diet rich in fruit and vegetables, and to commit to at least 30 minutes 5 days per week of regular exercise , to improve blood presure control.   Labs Reviewed: K+: 5.3 (10/16/2010) Creat: : 0.96 (10/16/2010)   Chol: 157 (10/16/2010)   HDL: 66 (10/16/2010)   LDL: 74 (10/16/2010)   TG: 85 (10/16/2010)  Problem # 6:  HYPERLIPIDEMIA (ICD-272.4) Assessment: Comment Only  Her updated  medication list for this problem includes:    Crestor 20 Mg Tabs (Rosuvastatin calcium) .Marland Kitchen... Take 1 tab by mouth at bedtime  Labs Reviewed: SGOT: 13 (10/16/2010)   SGPT: 12 (10/16/2010)   HDL:66 (10/16/2010), 56 (03/30/2010)  LDL:74 (10/16/2010), 160 (09/81/1914)  Chol:157 (10/16/2010), 239 (03/30/2010)  Trig:85 (10/16/2010), 116 (03/30/2010)  Problem # 7:  DIABETES (ICD-250.00) Assessment: Unchanged  Her updated medication list for this problem includes:    Glucovance 2.5-500 Mg Tabs (Glyburide-metformin) .Marland Kitchen... 2 tablets twice daily    Adult Aspirin Low Strength 81 Mg Tbdp (Aspirin) ..... One tab by mouth qd    Lisinopril-hydrochlorothiazide 20-12.5 Mg Tabs (Lisinopril-hydrochlorothiazide) .Marland Kitchen..Marland Kitchen Two tablets once daily  Orders: T- Hemoglobin A1C ([id number])  Labs Reviewed: Creat: 0.96 (10/16/2010)    Reviewed HgBA1c results: 7.0 (10/16/2010)  7.1 (06/27/2010)  Complete Medication List: 1)  Calcium 600 1500 Mg Tabs (Calcium carbonate) .... One tab by mouth two times a day 2)  Gabapentin 300 Mg Caps (gabapentin)  .Marland Kitchen.. 1 by mouth three times a day 3)  Proair Hfa 108 (90 Base) Mcg/act Aers (Albuterol sulfate) .... Inhale two puffs every six hours as needed 4)  Centrum Silver Tabs (Multiple vitamins-minerals) .... One tab by mouth once daily 5)  Methimazole 10 Mg Tabs (Methimazole) .... One tab by mouth once daily 6)  Pyridostigmine Bromide 60 Mg Tabs (Pyridostigmine bromide) .... One tab by mouth three times a day 7)  Prednisone 5 Mg Tabs (Prednisone) .... Take one tablet in the morning 8)  Glucovance 2.5-500 Mg Tabs (Glyburide-metformin) .... 2 tablets twice daily 9)  Combigan 0.2-0.5 % Soln (Brimonidine tartrate-timolol) .... Uad 10)  Accu-chek Compact Test Drum Strp (Glucose blood) .... Two times a day testing 11)  Adult Aspirin Low Strength 81 Mg Tbdp (Aspirin) .... One tab by mouth qd 12)  Citalopram Hydrobromide 40 Mg Tabs (Citalopram hydrobromide) .... Take 1 tablet by mouth  once a day 13)  Restasis 0.05 % Emul (Cyclosporine) .... One drop in each eye two times a day 14)  Nu-iron 150 Mg Caps (Polysaccharide iron complex) .... Take 1 tablet by mouth two times a day 15)  Miralax Powd (Polyethylene glycol 3350) .... As needed 16)  Ipratropium-albuterol 0.5-2.5 (3) Mg/76ml Soln (Ipratropium-albuterol) .... Use one vial in nebulizer four times daily as needed 17)  Furosemide 40 Mg Tabs (Furosemide) .... Take 1 tablet by mouth two times a day as needed 18)  Klor-con M20 20 Meq Cr-tabs (Potassium chloride crys cr) .... Take 1 tablet by mouth three times a day as needed 19)  Macrobid 100 Mg Caps (Nitrofurantoin monohyd macro) .... Take 1 tablet by mouth once a day 20)  Crestor  20 Mg Tabs (Rosuvastatin calcium) .... Take 1 tab by mouth at bedtime 21)  Loratadine 10 Mg Tabs (Loratadine) .... One tab by mouth once daily  discontinue allegra 22)  Premarin 0.625 Mg Tabs (Estrogens conjugated) .... Take 1 tablet by mouth once a day 23)  Promethazine Hcl 25 Mg Tabs (Promethazine hcl) .... Take 1 tablet by mouth once a day as needed 24)  Lorazepam 0.5 Mg Tabs (Lorazepam) .... Take 1 tablet by mouth once a day as needed 25)  Oxybutynin Chloride 10 Mg Xr24h-tab (Oxybutynin chloride) .... Take 1 tablet by mouth once a day 26)  Diltiazem Hcl Coated Beads 240 Mg Xr24h-cap (Diltiazem hcl coated beads) .... Take 1 tablet by mouth once a day 27)  Omeprazole 20 Mg Cpdr (Omeprazole) .... Take 1 capsule by mouth once a day 28)  Lisinopril-hydrochlorothiazide 20-12.5 Mg Tabs (Lisinopril-hydrochlorothiazide) .... Two tablets once daily 29)  Th Flax Seed Oil 1000 Mg Caps (Flaxseed (linseed)) .... Take 1 capsule by mouth once a day 30)  Hydrocodone-acetaminophen 10-500 Mg Tabs (Hydrocodone-acetaminophen) .... Take 1 tablet by mouth three times a day 31)  Fluticasone Propionate 50 Mcg/act Susp (Fluticasone propionate) .... One to two puffs per nostril dally as needed for uncontrolled allergies 32)   Venlafaxine Hcl 150 Mg Xr24h-cap (Venlafaxine hcl) .... Take 1 capsule by mouth once a day  Other Orders: Medicare Electronic Prescription [phone]) T-CBC w/Diff ([id number]) T-TSH ([id number]) TLB-H. Pylori Abs(Helicobacter Pylori) (86677-HELICO) Psychology Referral (Psychology)  Patient Instructions: 1)  Please schedule a follow-up appointment in 2 months. 2)  BMP prior to visit, ICD-9: 3)  TSH prior to visit, ICD-9: 4)  CBC w/ Diff prior to visit, ICD-9:   non fasting end Jan 5)  HbgA1C prior to visit, ICD-9:, h pylori 6)  You will be referred to podiatry  and to gI 7)  dose increase on velafexine for depression, and also referral for therapy Prescriptions: VENLAFAXINE HCL 150 MG XR24H-CAP (VENLAFAXINE HCL) Take 1 capsule by mouth once a day  #30 x 4   Entered and Authorized by:   Syliva Overman MD   Signed by:   Syliva Overman MD on 01/10/2011   Method used:   Printed then faxed to ...       [facility] (retail)       [address]       [place], [place]  [address]       Ph: [phone]       Fax: [phone]   RxID:   [id number]    Orders Added: 1)  Est. Patient Level IV [59563] 2)  Medicare Electronic Prescription [G8553] 3)  Gastroenterology Referral [GI] 4)  T-Basic Metabolic Panel [[id number]] 5)  T-CBC w/Diff [[id number]] 6)  T- Hemoglobin A1C [83036-23375] 7)  T-TSH [[id number]] 8)  TLB-H. Pylori Abs(Helicobacter Pylori) [86677-HELICO] 9)  Psychology Referral [Psychology] 10)  Podiatry Referral [Podiatry]

## 2011-02-01 NOTE — Progress Notes (Signed)
Summary: accu check  Phone Note Call from Patient   Summary of Call: pt is going back to accu check. Pt needs to get refill on this siance she is changing. [facility] [phone] Initial call taken by: Rudene Anda,  January 15, 2011 12:03 PM  Follow-up for Phone Call        what is patient asking for?  called patient, left message Follow-up by: Adella Hare LPN,  January 15, 2011 1:21 PM  Additional Follow-up for Phone Call Additional follow up Details #1::        spoke with patient and she wants her accuchek testing supplies sent to [facility]  sent as requested  Additional Follow-up by: Adella Hare LPN,  January 16, 2011 2:33 PM    New/Updated Medications: ACCU-CHEK COMPACT TEST DRUM  STRP (GLUCOSE BLOOD) once daily testing dx:250.00 ACCU-CHEK MULTICLIX LANCETS  MISC (LANCETS) once daily testing dx: 250.00 Prescriptions: ACCU-CHEK MULTICLIX LANCETS  MISC (LANCETS) once daily testing dx: 250.00  #100 x 0   Entered by:   Adella Hare LPN   Authorized by:   Syliva Overman MD   Signed by:   Adella Hare LPN on 78/29/5621   Method used:   Electronically to        [facility] (retail)       [address]       [place], [place]  [address]       Ph: [phone]       Fax: [phone]   RxID:   [id number] ACCU-CHEK COMPACT TEST DRUM  STRP (GLUCOSE BLOOD) once daily testing dx:250.00  #100 x 0   Entered by:   Adella Hare LPN   Authorized by:   Syliva Overman MD   Signed by:   Adella Hare LPN on 95/63/8756   Method used:   Electronically to        [facility] (retail)       [address]       [place], [place]  [address]       Ph: [phone]       Fax: [phone]   RxID:   [id number]

## 2011-02-01 NOTE — Letter (Signed)
Summary: DERMATOLOGY  DERMATOLOGY   Imported By: Lind Guest 09/22/2010 08:37:39  _____________________________________________________________________  External Attachment:    Type:   Image     Comment:   External Document

## 2011-02-01 NOTE — Progress Notes (Signed)
  Phone Note From Pharmacy   Caller: [facility] Summary of Call: pls stamp and fax l[facility]epam script to [facility] Initial call taken by: Syliva Overman MD,  July 17, 2010 8:08 AM  Follow-up for Phone Call        i printed them pls fax over to the [facility] Follow-up by: Syliva Overman MD,  July 17, 2010 12:35 PM  Additional Follow-up for Phone Call Additional follow up Details #1::        Prescription resent Additional Follow-up by: Adella Hare LPN,  July 17, 2010 1:55 PM    New/Updated Medications: PREMARIN 0.625 MG TABS (ESTROGENS CONJUGATED) Take 1 tablet by mouth once a day PROMETHAZINE HCL 25 MG TABS (PROMETHAZINE HCL) Take 1 tablet by mouth once a day as needed LORAZEPAM 0.5 MG TABS (LORAZEPAM) Take 1 tablet by mouth once a day as needed Prescriptions: LORAZEPAM 0.5 MG TABS (LORAZEPAM) Take 1 tablet by mouth once a day as needed  #30 x 3   Entered and Authorized by:   Syliva Overman MD   Signed by:   Syliva Overman MD on 07/17/2010   Method used:   Printed then faxed to ...       [facility] (retail)       [address]       [place], [place]  [address]       Ph: [phone]       Fax: [phone]   RxID:   [id number] PROMETHAZINE HCL 25 MG TABS (PROMETHAZINE HCL) Take 1 tablet by mouth once a day as needed  #30 x 3   Entered and Authorized by:   Syliva Overman MD   Signed by:   Syliva Overman MD on 07/17/2010   Method used:   Electronically to        [facility] (retail)       [address]       [place], [place]  [address]       Ph: [phone]       Fax: [phone]   RxID:   [id number] PREMARIN 0.625 MG TABS (ESTROGENS CONJUGATED) Take 1 tablet by mouth once a day  #30 x 3   Entered and Authorized by:   Syliva Overman MD   Signed by:   Syliva Overman MD on 07/17/2010   Method used:   Electronically to        [facility] (retail)       [address]  [place], [place]  [address]       Ph: [phone]       Fax: [phone]   RxID:   [id number]

## 2011-02-01 NOTE — Progress Notes (Signed)
  Phone Note Other Incoming   Caller: dr simpson Summary of Call: Sheray Chad need a lipid, hepatic, fasting, thyroid and vitD level anda HBAic, pls let her knowand order the tests also Initial call taken by: Syliva Overman MD,  March 27, 2010 12:38 PM  Follow-up for Phone Call        Patient aware Follow-up by: Everitt Amber LPN,  March 27, 2010 5:29 PM  New Problems: SPECIAL SCREENING FOR OSTEOPOROSIS (ICD-V82.81) FATIGUE (ICD-780.79)   New Problems: SPECIAL SCREENING FOR OSTEOPOROSIS (ICD-V82.81) FATIGUE (ICD-780.79)

## 2011-02-01 NOTE — Assessment & Plan Note (Signed)
Summary: FLU SHOT  Nurse Visit   Allergies: No Known Drug Allergies  Immunizations Administered:  Influenza Vaccine # 1:    Vaccine Type: Fluvax Non-MCR    Site: right deltoid    Mfr: novartis    Dose: 0.5 ml    Route: IM    Given by: Adella Hare LPN    Exp. Date: 05/2011    Lot #: [id number]    VIS given: 07/25/10 version given September 18, 2010.  Orders Added: 1)  Influenza Vaccine NON MCR [00028]

## 2011-02-02 ENCOUNTER — Other Ambulatory Visit: Payer: Self-pay | Admitting: Internal Medicine

## 2011-02-02 ENCOUNTER — Encounter: Payer: Self-pay | Admitting: Family Medicine

## 2011-02-02 ENCOUNTER — Encounter (HOSPITAL_COMMUNITY): Payer: Medicare Other | Attending: Internal Medicine

## 2011-02-02 DIAGNOSIS — Z01812 Encounter for preprocedural laboratory examination: Secondary | ICD-10-CM | POA: Insufficient documentation

## 2011-02-02 DIAGNOSIS — Z01810 Encounter for preprocedural cardiovascular examination: Secondary | ICD-10-CM | POA: Insufficient documentation

## 2011-02-02 LAB — BASIC METABOLIC PANEL
BUN: 14 mg/dL (ref 6–23)
CO2: 32 mEq/L (ref 19–32)
Calcium: 10 mg/dL (ref 8.4–10.5)
Chloride: 102 mEq/L (ref 96–112)
Creatinine, Ser: 0.89 mg/dL (ref 0.4–1.2)
GFR calc Af Amer: >60 mL/min (ref >60)
GFR calc non Af Amer: >60 mL/min (ref >60)
Glucose, Bld: 90 mg/dL (ref 70–99)
Potassium: 5 mEq/L (ref 3.5–5.1)
Sodium: 140 mEq/L (ref 135–145)

## 2011-02-02 LAB — HEMOGLOBIN AND HEMATOCRIT, BLOOD
HCT: 29.9 % — ABNORMAL LOW (ref 36.0–46.0)
Hemoglobin: 9.9 g/dL — ABNORMAL LOW (ref 12.0–15.0)

## 2011-02-02 NOTE — Letter (Signed)
Summary: med review sheet  med review sheet   Imported By: Rudene Anda 10/10/2010 16:14:26  _____________________________________________________________________  External Attachment:    Type:   Image     Comment:   External Document

## 2011-02-07 NOTE — Assessment & Plan Note (Signed)
Summary: INJECTION  Nurse Visit   Vital Signs:  Patient profile:   70 year old female Menstrual status:  postmenopausal Height:      66 inches Weight:      269.75 pounds BP sitting:   148 / 78  (left arm) Cuff size:   large  Vitals Entered By: Everitt Amber LPN (January 30, 2011 4:34 PM) CC: Patient in for pain injections for her left knee pain   Allergies: No Known Drug Allergies  Medication Administration  Injection # 1:    Medication: Depo- Medrol 80mg     Diagnosis: KNEE PAIN, LEFT (ICD-719.46)    Route: IM    Site: LUOQ gluteus    Exp Date: 07/2011    Lot #: [id number]    Mfr: Pharmacia    Comments: 80mg  given     Patient tolerated injection without complications    Given by: Everitt Amber LPN (January 30, 2011 4:36 PM)  Injection # 2:    Medication: Ketorolac-Toradol 15mg     Diagnosis: KNEE PAIN, LEFT (ICD-719.46)    Route: IM    Site: RUOQ gluteus    Exp Date: 05/2012    Lot #: [id number]     Mfr: [email or web address]    Comments: 60mg  given     Patient tolerated injection without complications    Given by: Everitt Amber LPN (January 30, 2011 4:37 PM)  Orders Added: 1)  Depo- Medrol 80mg  [J1040] 2)  Ketorolac-Toradol 15mg  [J1885] 3)  Admin of Therapeutic Inj  intramuscular or subcutaneous [[id number]]   Medication Administration  Injection # 1:    Medication: Depo- Medrol 80mg     Diagnosis: KNEE PAIN, LEFT (ICD-719.46)    Route: IM    Site: LUOQ gluteus    Exp Date: 07/2011    Lot #: [id number]    Mfr: Pharmacia    Comments: 80mg  given     Patient tolerated injection without complications    Given by: Everitt Amber LPN (January 30, 2011 4:36 PM)  Injection # 2:    Medication: Ketorolac-Toradol 15mg     Diagnosis: KNEE PAIN, LEFT (ICD-719.46)    Route: IM    Site: RUOQ gluteus    Exp Date: 05/2012    Lot #: [id number]     Mfr: novaplus    Comments: 60mg  given     Patient tolerated injection without complications    Given by: Everitt Amber LPN (January 30, 2011 4:37 PM)  Orders  Added: 1)  Depo- Medrol 80mg  [J1040] 2)  Ketorolac-Toradol 15mg  [J1885] 3)  Admin of Therapeutic Inj  intramuscular or subcutaneous [96372] no adverse effects, orders given as above

## 2011-02-07 NOTE — Miscellaneous (Signed)
  Clinical Lists Changes  Problems: Added new problem of THYROID FUNCTION TEST, ABNORMAL (ICD-794.5) Orders: Added new Referral order of Endocrinology Referral (Endocrine) - Signed

## 2011-02-07 NOTE — Letter (Signed)
Summary: add on lab order  add on lab order   Imported By: Lind Guest 02/02/2011 09:18:40  _____________________________________________________________________  External Attachment:    Type:   Image     Comment:   External Document

## 2011-02-07 NOTE — Assessment & Plan Note (Signed)
Summary: dysphagia,anemia/ss   Visit Type:  Initial Consult Primary Care Provider:  Dr. Syliva Overman   CC:  difficulty swallowing and nausea.  History of Present Illness: Pt presents today in consultation regarding dysphagia and nausea. She reports +dysphagia x 6 mos. States it has worsened over time. Unable to eat meat. Difficulty with pills as well. Tends to stay with vegetables. Doesn't have much of an appetite reportedly. Certain smells make her nauseated. +Nausea that is r/t eating, which is why she tries to avoid eating. Also reports +epigastric "soreness" after eating. Takes Omeprazole once/day. Hx of EGD/ED in 2007 with Schatzki's ring, s/p dilatation as well as 2009 with mild erosive reflux esophagitis, s/p dilatation secondary to non-critical Schatzki's ring. Did well in past on Aciphex, but due to insurance constraints is not on. Denies use of ibuprofen, aleve, BC, Goodys. Does take prednisone daily.  Also reports lower abdominal pain, intermittent, described as achy. 4/10 when it occurs. +constipation. Feels like discomfort is worsened when constipated. Miralax every night. Stool softener one per day. Stool still somewhat hard. No melena or hematochezia. Last TCS 2005: pan colonic diverticula, next due in 2015.   Current Medications (verified): 1)  Calcium 600 1500 Mg  Tabs (Calcium Carbonate) .... One Tab By Mouth Two Times A Day 2)  Gabapentin 300 Mg  Caps (Gabapentin) .Marland Kitchen.. 1 By Mouth Three Times A Day 3)  Proair Hfa 108 (90 Base) Mcg/act  Aers (Albuterol Sulfate) .... Inhale Two Puffs Every Six Hours As Needed 4)  Centrum Silver   Tabs (Multiple Vitamins-Minerals) .... One Tab By Mouth Once Daily 5)  Methimazole 10 Mg  Tabs (Methimazole) .... One Tab By Mouth Once Daily 6)  Pyridostigmine Bromide 60 Mg  Tabs (Pyridostigmine Bromide) .... One Tab By Mouth Three Times A Day 7)  Prednisone 5 Mg  Tabs (Prednisone) .... Take One Tablet in The Morning 8)  Glucovance 2.5-500 Mg Tabs  (Glyburide-Metformin) .... 2 Tablets Twice Daily 9)  Combigan 0.2-0.5 % Soln (Brimonidine Tartrate-Timolol) .... Uad 10)  Accu-Chek Compact Test Drum  Strp (Glucose Blood) .... Once Daily Testing Dx:250.00 11)  Adult Aspirin Low Strength 81 Mg Tbdp (Aspirin) .... One Tab By Mouth Qd 12)  Citalopram Hydrobromide 40 Mg Tabs (Citalopram Hydrobromide) .... Take 1 Tablet By Mouth Once A Day 13)  Restasis 0.05 % Emul (Cyclosporine) .... One Drop in Each Eye Two Times A Day 14)  Nu-Iron 150 Mg Caps (Polysaccharide Iron Complex) .... Take 1 Tablet By Mouth Two Times A Day 15)  Miralax  Powd (Polyethylene Glycol 3350) .... As Needed 16)  Ipratropium-Albuterol 0.5-2.5 (3) Mg/13ml Soln (Ipratropium-Albuterol) .... Use One Vial in Nebulizer Four Times Daily As Needed 17)  Furosemide 40 Mg Tabs (Furosemide) .... Take 1 Tablet By Mouth Two Times A Day As Needed 18)  Klor-Con M20 20 Meq Cr-Tabs (Potassium Chloride Crys Cr) .... Take 1 Tablet By Mouth Three Times A Day As Needed 19)  Macrobid 100 Mg Caps (Nitrofurantoin Monohyd Macro) .... Take 1 Tablet By Mouth Once A Day 20)  Crestor 20 Mg Tabs (Rosuvastatin Calcium) .... Take 1 Tab By Mouth At Bedtime 21)  Loratadine 10 Mg Tabs (Loratadine) .... One Tab By Mouth Once Daily  Discontinue Allegra 22)  Premarin 0.625 Mg Tabs (Estrogens Conjugated) .... Take 1 Tablet By Mouth Once A Day 23)  Promethazine Hcl 25 Mg Tabs (Promethazine Hcl) .... Take 1 Tablet By Mouth Once A Day As Needed 24)  Lorazepam 0.5 Mg Tabs (Lorazepam) .... Take  1 Tablet By Mouth Once A Day As Needed 25)  Oxybutynin Chloride 10 Mg Xr24h-Tab (Oxybutynin Chloride) .... Take 1 Tablet By Mouth Once A Day 26)  Diltiazem Hcl Coated Beads 240 Mg Xr24h-Cap (Diltiazem Hcl Coated Beads) .... Take 1 Tablet By Mouth Once A Day 27)  Omeprazole 20 Mg Cpdr (Omeprazole) .... Take 1 Capsule By Mouth Once A Day 28)  Lisinopril-Hydrochlorothiazide 20-12.5 Mg Tabs (Lisinopril-Hydrochlorothiazide) .... Two Tablets  Once Daily 29)  Th Flax Seed Oil 1000 Mg Caps (Flaxseed (Linseed)) .... Take 1 Capsule By Mouth Once A Day 30)  Hydrocodone-Acetaminophen 10-500 Mg Tabs (Hydrocodone-Acetaminophen) .... Take 1 Tablet By Mouth Three Times A Day 31)  Fluticasone Propionate 50 Mcg/act Susp (Fluticasone Propionate) .... One To Two Puffs Per Nostril Dally As Needed For Uncontrolled Allergies 32)  Venlafaxine Hcl 150 Mg Xr24h-Cap (Venlafaxine Hcl) .... Take 1 Capsule By Mouth Once A Day 33)  Accu-Chek Multiclix Lancets  Misc (Lancets) .... Once Daily Testing Dx: 250.00  Allergies (verified): No Known Drug Allergies  Past History:  Past Medical History: Diabetes Hypertension Tobacco abuse-remote * MYASTHENIA GRAVIS Chest pain; peripheral edema:  Negative stress nuclear study in 2005; moderate MR and mild to moderate           pulmonary hypertension on echocardiogram in 07/2008; repeat study in 10/2009 shows trivial MR       and very mild pulmonary hypertension Arthritis Gastroesophageal reflux disease Thyroid disease SLEEP APNEA (ICD-780.57) Iron deficiency anemia DEPRESSION (ICD-311) HYPERLIPIDEMIA (ICD-272.4) OBESITY (ICD-278.00) TOTAL KNEE FOLLOW-UP (ICD-V43.65) CARPAL TUNNEL SYNDROME (ICD-354.0) SHOULDER PAIN (ICD-719.41) IMPINGEMENT SYNDROME (ICD-726.2) 2007 EGD/ED: Schatzki's ring 2009: EGD/ED: mild erosive reflux esophagitis, non-critical Schatzki's ring 2005 TCS: pan colonic diverticula  Past Surgical History: RT TKA 05/13/07 - Dr Romeo Apple Gallbladder Hysterectomy Elevation of both eyelids for proptosis 07/09 R carpal tunnel release  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 Eyes:  Denies blurring, irritation, and discharge. ENT:  Complains of difficulty swallowing; denies sore throat and hoarseness. CV:  Denies chest pains and syncope. Resp:  Denies dyspnea at rest and wheezing. GI:  Complains of difficulty swallowing, nausea, abdominal pain, and constipation; denies pain on swallowing, indigestion/heartburn, gas/bloating, diarrhea, change in bowel habits, bloody BM's, and black BMs. GU:  Denies urinary burning and urinary frequency. MS:  Denies joint pain / LOM, joint swelling, and joint stiffness. Derm:  Denies rash, itching, and dry skin. Neuro:  Denies weakness and syncope. Psych:  Denies depression and anxiety. Endo:  Denies cold intolerance and heat intolerance.  Vital Signs:  Patient profile:   70 year old female Menstrual status:  postmenopausal Height:      66 inches Weight:      276 pounds BMI:     44.71 Temp:     98.1 degrees F oral Pulse rate:   80 / minute BP sitting:   142 / 84  (left arm) Cuff size:   large  Vitals Entered By: Hendricks Limes LPN (January 29, 2011 8:51 AM)  Physical Exam  General:  Well developed, well nourished, no acute distress. Head:  Normocephalic and atraumatic. Eyes:  sclera without icterus, conjuctiva clear Mouth:  No deformity or lesions, dentition normal. Lungs:  Clear throughout to auscultation. Heart:  Regular rate and rhythm; no murmurs, rubs,  or bruits. Abdomen:  obese, +BS, soft, non-tender, non-distended. no HSM, no rebound or guarding.  Msk:  Symmetrical with no gross deformities. Normal posture. Pulses:  Normal pulses noted. Extremities:  No clubbing, cyanosis, edema or deformities noted. Neurologic:  Alert and  oriented x4;  grossly normal  neurologically. Skin:  Intact without significant lesions or rashes. Psych:  Alert and cooperative. Normal mood and affect.  Impression & Recommendations:  Problem # 1:  ABDOMINAL PAIN (ICD-789.00) +epigastric pain after eating as well as +dysphagia X 6 mos, worsening over past few months. Hx of Schatzki's ring requiring dilatation in 2007 and 2009, as well as mild erosive reflux esophagitis. +Nausea with eating. Omeprazole once daily, denies use of BC, Goodys, Ibuprofen, Aleve, etc. Is on Prednisone daily secondary to myasthenia gravis.  EGD/ED with Dr. Jena Gauss in near future in OR secondary to polypharmacy: The R/B/A have been discussed in detail; pt desires to proceed. Continue Omeprazole. Pt did well with Aciphex in past, but due to insurance had to change. May likely benefit from restarting this.  Orders: T-CBC w/Diff ([id number]) Consultation Level III (09811)  Problem # 2:  NAUSEA (ICD-787.02) See # 1 Orders: T-CBC w/Diff ([id number])  Problem # 3:  CONSTIPATION (ICD-564.00)  Chronic constipation, no melena or hematochezia. On iron as well as narcotics. Stool softener once/day, miralax nightly. No fiber or probiotic. Will add these to bowel regimen. +Lower abdominal discomfort with increased constipation. Doubt other etiology as constipation is likely culprit. no change in bowel habits or need for TCS at this time. Next due in 2015.    Probiotic daily Fiber supplement of choice daily Increase stool softener to twice/day Continue Miralax nightly  Orders: Consultation Level III (13086)  Problem # 4:  ANEMIA (ICD-285.9) hx of iron deficiency anemia. Last CBC in June 2010. Will redraw to assess for any changes, stability.   Patient Instructions: 1)  Take a probiotic daily (samples given) 2)  Take fiber supplements daily (samples given) 3)  Increase stool softener to one in morning and one at night 4)  Labs today 5)  The medication list was reviewed and reconciled.  All  changed / newly prescribed medications were explained.  A complete medication list was provided to the patient / caregiver.

## 2011-02-07 NOTE — Letter (Addendum)
Summary: EGD/ED ORDER  EGD/ED ORDER   Imported By: Ave Filter 01/29/2011 10:03:27  _____________________________________________________________________  External Attachments:     1. Type:   Image          Comment:   External Document    2. Type:   Image          Comment:   External Document

## 2011-02-07 NOTE — Progress Notes (Signed)
Summary: INJECTION FOR KNEE  Phone Note Call from Patient   Summary of Call: WANST TO KNOW CAN SHE HAVE A INJECTION FOR HER KNEE  CALL BACK AT [phone]  CELL #  [phone] Initial call taken by: Lind Guest,  January 30, 2011 1:55 PM  Follow-up for Phone Call        Wants an injection for her left knee pain. Has used a whole tube of bengay the past few days. Feels the most  pain on the back of the knee. Wants to come in today for nurse visit if possible. Follow-up by: Everitt Amber LPN,  January 30, 2011 2:14 PM  Additional Follow-up for Phone Call Additional follow up Details #1::        pls call and let her know ok for injections , pls give toradol 60mg  Im and depomedrol 80mg  IM Additional Follow-up by: Syliva Overman MD,  January 30, 2011 3:53 PM    Additional Follow-up for Phone Call Additional follow up Details #2::    patient coming in now Follow-up by: Everitt Amber LPN,  January 30, 2011 4:01 PM

## 2011-02-08 ENCOUNTER — Ambulatory Visit (HOSPITAL_COMMUNITY)
Admission: RE | Admit: 2011-02-08 | Discharge: 2011-02-08 | Disposition: A | Discharge status: Home / Self Care | Payer: Medicare Other | Source: Ambulatory Visit | Attending: Internal Medicine | Admitting: Internal Medicine

## 2011-02-08 ENCOUNTER — Encounter (HOSPITAL_COMMUNITY): Payer: Self-pay | Admitting: Psychiatry

## 2011-02-08 ENCOUNTER — Encounter: Payer: MEDICARE | Admitting: Internal Medicine

## 2011-02-08 DIAGNOSIS — I10 Essential (primary) hypertension: Secondary | ICD-10-CM | POA: Insufficient documentation

## 2011-02-08 DIAGNOSIS — K449 Diaphragmatic hernia without obstruction or gangrene: Secondary | ICD-10-CM | POA: Insufficient documentation

## 2011-02-08 DIAGNOSIS — J4489 Other specified chronic obstructive pulmonary disease: Secondary | ICD-10-CM | POA: Insufficient documentation

## 2011-02-08 DIAGNOSIS — E119 Type 2 diabetes mellitus without complications: Secondary | ICD-10-CM | POA: Insufficient documentation

## 2011-02-08 DIAGNOSIS — Z79899 Other long term (current) drug therapy: Secondary | ICD-10-CM | POA: Insufficient documentation

## 2011-02-08 DIAGNOSIS — K222 Esophageal obstruction: Secondary | ICD-10-CM

## 2011-02-08 DIAGNOSIS — K219 Gastro-esophageal reflux disease without esophagitis: Secondary | ICD-10-CM | POA: Insufficient documentation

## 2011-02-08 DIAGNOSIS — J449 Chronic obstructive pulmonary disease, unspecified: Secondary | ICD-10-CM | POA: Insufficient documentation

## 2011-02-08 DIAGNOSIS — R1310 Dysphagia, unspecified: Secondary | ICD-10-CM | POA: Insufficient documentation

## 2011-02-08 HISTORY — PX: ESOPHAGOGASTRODUODENOSCOPY: SHX1529

## 2011-02-08 LAB — GLUCOSE, CAPILLARY: Glucose-Capillary: 94 mg/dL (ref 70–99)

## 2011-02-12 ENCOUNTER — Other Ambulatory Visit (HOSPITAL_COMMUNITY): Payer: Self-pay | Admitting: "Endocrinology

## 2011-02-12 ENCOUNTER — Encounter: Payer: Self-pay | Admitting: Family Medicine

## 2011-02-12 DIAGNOSIS — E049 Nontoxic goiter, unspecified: Secondary | ICD-10-CM

## 2011-02-13 ENCOUNTER — Other Ambulatory Visit: Payer: Self-pay | Admitting: Family Medicine

## 2011-02-13 ENCOUNTER — Ambulatory Visit (HOSPITAL_COMMUNITY)
Admission: RE | Admit: 2011-02-13 | Discharge: 2011-02-13 | Disposition: A | Discharge status: Home / Self Care | Payer: Medicare Other | Source: Ambulatory Visit | Attending: Internal Medicine | Admitting: Internal Medicine

## 2011-02-13 DIAGNOSIS — E049 Nontoxic goiter, unspecified: Secondary | ICD-10-CM | POA: Insufficient documentation

## 2011-02-13 DIAGNOSIS — Z139 Encounter for screening, unspecified: Secondary | ICD-10-CM

## 2011-02-13 DIAGNOSIS — E041 Nontoxic single thyroid nodule: Secondary | ICD-10-CM | POA: Insufficient documentation

## 2011-02-14 ENCOUNTER — Other Ambulatory Visit (HOSPITAL_COMMUNITY): Payer: Self-pay | Admitting: "Endocrinology

## 2011-02-14 DIAGNOSIS — E049 Nontoxic goiter, unspecified: Secondary | ICD-10-CM

## 2011-02-15 ENCOUNTER — Encounter (HOSPITAL_COMMUNITY): Payer: Self-pay | Admitting: Psychiatry

## 2011-02-19 ENCOUNTER — Ambulatory Visit (HOSPITAL_COMMUNITY)
Admission: RE | Admit: 2011-02-19 | Discharge: 2011-02-19 | Disposition: A | Discharge status: Home / Self Care | Payer: Medicare Other | Source: Ambulatory Visit | Attending: Family Medicine | Admitting: Family Medicine

## 2011-02-19 DIAGNOSIS — Z1231 Encounter for screening mammogram for malignant neoplasm of breast: Secondary | ICD-10-CM | POA: Insufficient documentation

## 2011-02-19 DIAGNOSIS — Z139 Encounter for screening, unspecified: Secondary | ICD-10-CM

## 2011-02-20 ENCOUNTER — Encounter: Payer: Self-pay | Admitting: Family Medicine

## 2011-02-20 NOTE — Op Note (Signed)
  [name], [name]              ACCOUNT NO.:  [id number]  MEDICAL RECORD NO.:  [id number]           PATIENT TYPE:  O  LOCATION:  [facility]                          FACILITY:  [facility]  PHYSICIAN:  R. Roetta Sessions, [name] DATE OF BIRTH:  04-Aug-1941  DATE OF PROCEDURE:  02/08/2011 DATE OF DISCHARGE:                              OPERATIVE REPORT   PROCEDURE:  EGD with Elease Hashimoto dilation.  INDICATIONS FOR PROCEDURE:  A 70 year old lady with chronic GERD and esophageal dysphagia in the setting of known Schatzki's ring, also has myasthenia gravis.  Last EGD with dilation, a couple of years ago.  She derived significant proven an esophageal dilation until recently.  EGD is now being done with potential for esophageal dilation.  Risks, benefits, limitations, alternatives, imponderables have been discussed, questions answered.  Please see the documentation in the medical record.  PROCEDURE NOTE:  O2 saturation, blood pressure, pulse, respirations monitored throughout the entire procedure.  Propofol sedation in the ER per Dr. [facility].  FINDINGS:  Examination of tubular esophagus revealed a single somewhat spiral shaped noncritical Schatzki's ring.  Esophageal mucosa was normal down to the EG junction.  Tubular esophagus was widely patent through the EG junction.  Stomach:  Gas cavity was emptied and insufflated well with air. Thorough examination of gastric mucosa including retroflexion of proximal stomach, esophagogastric junction demonstrated only a small hiatal hernia.  Pylorus was patent, easily traversed.  Examination of bulb and second portion revealed no abnormalities.  THERAPEUTIC/DIAGNOSTIC MANEUVERS PERFORMED:  Scope was withdrawn.  A 58- French Maloney dilator was passed to full insertion with mild resistance.  A look back revealed no apparent complications related to passage of the dilator.  Ring remained intact, but again noncritical and the EG junction was widely  patent.  The patient tolerated the procedure well, taken to the PACU in stable condition.  IMPRESSION:  A noncritical spiral-shaped single Schatzki's ring, patent tubular esophagus status post passage of a 58-French Maloney dilator, small hiatal hernia, otherwise normal stomach, D1 and D2.  RECOMMENDATIONS: 1. Continue omeprazole. 2. Hopefully, this will disassociate with improvement in dysphagia     symptoms.  I suspect she may have an     underlying esophageal motility disorder related to myasthenia     gravis.  Should she develop recurrent dysphagia in the future would     proceed with a barium pill esophagram for doing anything else.  We     will plan to see this nice lady back in the office in 3 months.     Jonathon Bellows, [name]     RMR/MEDQ  D:  02/08/2011  T:  02/08/2011  Job:  [id number]  cc:   Milus Mallick. Lodema Hong, [name] Fax: [phone]  Electronically Signed by Lorrin Goodell [name] on 02/20/2011 02:33:28 PM

## 2011-02-21 NOTE — Letter (Signed)
Summary: DR. NIDA  DR. NIDA   Imported By: Lind Guest 02/14/2011 10:19:27  _____________________________________________________________________  External Attachment:    Type:   Image     Comment:   External Document

## 2011-02-25 ENCOUNTER — Emergency Department (HOSPITAL_COMMUNITY)
Admission: EM | Admit: 2011-02-25 | Discharge: 2011-02-25 | Disposition: A | Discharge status: Home / Self Care | Payer: Medicare Other | Attending: Emergency Medicine | Admitting: Emergency Medicine

## 2011-02-25 ENCOUNTER — Emergency Department (HOSPITAL_COMMUNITY): Payer: Medicare Other

## 2011-02-25 DIAGNOSIS — Z79899 Other long term (current) drug therapy: Secondary | ICD-10-CM | POA: Insufficient documentation

## 2011-02-25 DIAGNOSIS — I10 Essential (primary) hypertension: Secondary | ICD-10-CM | POA: Insufficient documentation

## 2011-02-25 DIAGNOSIS — R5381 Other malaise: Secondary | ICD-10-CM | POA: Insufficient documentation

## 2011-02-25 DIAGNOSIS — R002 Palpitations: Secondary | ICD-10-CM | POA: Insufficient documentation

## 2011-02-25 DIAGNOSIS — N390 Urinary tract infection, site not specified: Secondary | ICD-10-CM | POA: Insufficient documentation

## 2011-02-25 DIAGNOSIS — E119 Type 2 diabetes mellitus without complications: Secondary | ICD-10-CM | POA: Insufficient documentation

## 2011-02-25 LAB — URINALYSIS, ROUTINE W REFLEX MICROSCOPIC
Nitrite: NEGATIVE
Protein, ur: 100 mg/dL — AB
Specific Gravity, Urine: >1.03 — ABNORMAL HIGH (ref 1.005–1.030)
Urobilinogen, UA: 0.2 mg/dL (ref 0.0–1.0)

## 2011-02-25 LAB — DIFFERENTIAL
Basophils Absolute: 0 10*3/uL (ref 0.0–0.1)
Lymphocytes Relative: 12 % (ref 12–46)
Monocytes Absolute: 0.6 10*3/uL (ref 0.1–1.0)
Neutro Abs: 9.1 10*3/uL — ABNORMAL HIGH (ref 1.7–7.7)
Neutrophils Relative %: 81 % — ABNORMAL HIGH (ref 43–77)

## 2011-02-25 LAB — CBC
HCT: 34.9 % — ABNORMAL LOW (ref 36.0–46.0)
Hemoglobin: 11.3 g/dL — ABNORMAL LOW (ref 12.0–15.0)
RBC: 4.05 MIL/uL (ref 3.87–5.11)
WBC: 11.2 10*3/uL — ABNORMAL HIGH (ref 4.0–10.5)

## 2011-02-25 LAB — URINE MICROSCOPIC-ADD ON

## 2011-02-25 LAB — POCT CARDIAC MARKERS
CKMB, poc: <1 ng/mL — ABNORMAL LOW (ref 1.0–8.0)
Myoglobin, poc: 70.1 ng/mL (ref 12–200)
Troponin i, poc: <0.05 ng/mL (ref 0.00–0.09)

## 2011-02-25 LAB — BASIC METABOLIC PANEL
CO2: 25 mEq/L (ref 19–32)
Chloride: 100 mEq/L (ref 96–112)
Glucose, Bld: 207 mg/dL — ABNORMAL HIGH (ref 70–99)
Potassium: 3.9 mEq/L (ref 3.5–5.1)
Sodium: 134 mEq/L — ABNORMAL LOW (ref 135–145)

## 2011-02-27 NOTE — Letter (Signed)
Summary: lab add on  lab add on   Imported By: Luann Bullins 02/20/2011 14:19:06  _____________________________________________________________________  External Attachment:    Type:   Image     Comment:   External Document

## 2011-03-01 ENCOUNTER — Encounter: Payer: Self-pay | Admitting: Family Medicine

## 2011-03-01 ENCOUNTER — Ambulatory Visit (INDEPENDENT_AMBULATORY_CARE_PROVIDER_SITE_OTHER): Payer: Medicare Other | Admitting: Family Medicine

## 2011-03-01 DIAGNOSIS — G4730 Sleep apnea, unspecified: Secondary | ICD-10-CM

## 2011-03-01 DIAGNOSIS — N3090 Cystitis, unspecified without hematuria: Secondary | ICD-10-CM

## 2011-03-01 DIAGNOSIS — E785 Hyperlipidemia, unspecified: Secondary | ICD-10-CM

## 2011-03-01 DIAGNOSIS — I10 Essential (primary) hypertension: Secondary | ICD-10-CM

## 2011-03-13 NOTE — Assessment & Plan Note (Signed)
Summary: offic e visit per dr   Vital Signs:  Patient profile:   70 year old female Menstrual status:  postmenopausal Height:      66 inches Weight:      270 pounds BMI:     43.74 O2 Sat:      93 % on Room air Pulse rate:   72 / minute Pulse rhythm:   regular Resp:     16 per minute BP sitting:   170 / 84  (left arm)  Vitals Entered By: Everitt Amber LPN (February 29, 6044 2:20 PM)  Nutrition Counseling: Patient's BMI is greater than 25 and therefore counseled on weight management options.  O2 Flow:  Room air CC: ER follow up, she was having palpatations   Primary Care Provider:  Dr. Syliva Overman   CC:  ER follow up and she was having palpatations.  History of Present Illness: Pt in for eD f/u. She presented with generalised weakness,palpitations and fatigue, all of which she had attributed to recently being discontinued from thyroid medication. A call to the endo discredited this theory. She had a UTI which she is on medication for, and I explained this was the likely cause of her symptoms. She is somewhat confused andanxious also She did have a few sessions with the therapist which she found expensive, but useful.  Current Medications (verified): 1)  Calcium 600 1500 Mg  Tabs (Calcium Carbonate) .... One Tab By Mouth Two Times A Day 2)  Gabapentin 300 Mg  Caps (Gabapentin) .Marland Kitchen.. 1 By Mouth Three Times A Day 3)  Proair Hfa 108 (90 Base) Mcg/act  Aers (Albuterol Sulfate) .... Inhale Two Puffs Every Six Hours As Needed 4)  Centrum Silver   Tabs (Multiple Vitamins-Minerals) .... One Tab By Mouth Once Daily 5)  Pyridostigmine Bromide 60 Mg  Tabs (Pyridostigmine Bromide) .... One Tab By Mouth Three Times A Day 6)  Prednisone 5 Mg  Tabs (Prednisone) .... Take One Tablet in The Morning 7)  Glucovance 2.5-500 Mg Tabs (Glyburide-Metformin) .... 2 Tablets Twice Daily 8)  Combigan 0.2-0.5 % Soln (Brimonidine Tartrate-Timolol) .... Uad 9)  Accu-Chek Compact Test Drum  Strp (Glucose  Blood) .... Once Daily Testing Dx:250.00 10)  Adult Aspirin Low Strength 81 Mg Tbdp (Aspirin) .... One Tab By Mouth Qd 11)  Citalopram Hydrobromide 40 Mg Tabs (Citalopram Hydrobromide) .... Take 1 Tablet By Mouth Once A Day 12)  Restasis 0.05 % Emul (Cyclosporine) .... One Drop in Each Eye Two Times A Day 13)  Nu-Iron 150 Mg Caps (Polysaccharide Iron Complex) .... Take 1 Tablet By Mouth Two Times A Day 14)  Miralax  Powd (Polyethylene Glycol 3350) .... As Needed 15)  Ipratropium-Albuterol 0.5-2.5 (3) Mg/9ml Soln (Ipratropium-Albuterol) .... Use One Vial in Nebulizer Four Times Daily As Needed 16)  Furosemide 40 Mg Tabs (Furosemide) .... Take 1 Tablet By Mouth Two Times A Day As Needed 17)  Klor-Con M20 20 Meq Cr-Tabs (Potassium Chloride Crys Cr) .... Take 1 Tablet By Mouth Three Times A Day As Needed 18)  Crestor 20 Mg Tabs (Rosuvastatin Calcium) .... Take 1 Tab By Mouth At Bedtime 19)  Loratadine 10 Mg Tabs (Loratadine) .... One Tab By Mouth Once Daily  Discontinue Allegra 20)  Premarin 0.625 Mg Tabs (Estrogens Conjugated) .... Take 1 Tablet By Mouth Once A Day 21)  Promethazine Hcl 25 Mg Tabs (Promethazine Hcl) .... Take 1 Tablet By Mouth Once A Day As Needed 22)  Lorazepam 0.5 Mg Tabs (Lorazepam) .... Take  1 Tablet By Mouth Once A Day As Needed 23)  Diltiazem Hcl Coated Beads 240 Mg Xr24h-Cap (Diltiazem Hcl Coated Beads) .... Take 1 Tablet By Mouth Once A Day 24)  Omeprazole 20 Mg Cpdr (Omeprazole) .... Take 1 Capsule By Mouth Once A Day 25)  Lisinopril-Hydrochlorothiazide 20-12.5 Mg Tabs (Lisinopril-Hydrochlorothiazide) .... Two Tablets Once Daily 26)  Th Flax Seed Oil 1000 Mg Caps (Flaxseed (Linseed)) .... Take 1 Capsule By Mouth Once A Day 27)  Hydrocodone-Acetaminophen 10-500 Mg Tabs (Hydrocodone-Acetaminophen) .... Take 1 Tablet By Mouth Three Times A Day 28)  Fluticasone Propionate 50 Mcg/act Susp (Fluticasone Propionate) .... One To Two Puffs Per Nostril Dally As Needed For Uncontrolled  Allergies 29)  Venlafaxine Hcl 150 Mg Xr24h-Cap (Venlafaxine Hcl) .... Take 1 Capsule By Mouth Once A Day 30)  Accu-Chek Multiclix Lancets  Misc (Lancets) .... Once Daily Testing Dx: 250.00  Allergies (verified): No Known Drug Allergies  Review of Systems      See HPI General:  Complains of fatigue, malaise, and weakness. Eyes:  Complains of vision loss-both eyes; established dx of glaucoma followed closely by opthalmology. MS:  Complains of joint pain, low back pain, mid back pain, and stiffness; chronic. Neuro:  Complains of memory loss; reports feeling slightly confused with memory disturbance in the past 1 week. Psych:  Complains of anxiety, depression, and mental problems; denies sense of great danger, suicidal thoughts/plans, thoughts of violence, and unusual visions or sounds; slightly improved, encouraged pt to continue therapy. Endo:  Denies cold intolerance, excessive thirst, excessive urination, and heat intolerance; fasting sugars are seldom over 125. Heme:  Denies abnormal bruising, bleeding, enlarge lymph nodes, and fevers. Allergy:  Complains of seasonal allergies.  Physical Exam  General:  Well-developed,obese,in no acute distress; alert,appropriate and cooperative throughout examination.Pt somewhat anxious and confused HEENT: No facial asymmetry,  EOMI, No sinus tenderness, TM's Clear, oropharynx  pink and moist.   Chest: Clear to auscultation bilaterally.  CVS: S1, S2, No murmurs, No S3.   Abd: Soft, Nontender.  EA:[id number] ROM spine, hips, shoulders and knees.  Ext: No edema.   CNS: CN 2-12 intact, power tone and sensation normal throughout.   Skin: Intact, no visible lesions or rashes.  Psych: Good eye contact, flat affect.  Memory intact, depressed appearing.    Impression & Recommendations:  Problem # 1:  THYROID FUNCTION TEST, ABNORMAL (ICD-794.5) Assessment Comment Only pt now being followed by endocrfine  Problem # 2:  HYPERTENSION  (ICD-401.1) Assessment: Deteriorated  Her updated medication list for this problem includes:    Furosemide 40 Mg Tabs (Furosemide) .Marland Kitchen... Take 1 tablet by mouth two times a day as needed    Diltiazem Hcl Coated Beads 240 Mg Xr24h-cap (Diltiazem hcl coated beads) .Marland Kitchen... Take 1 tablet by mouth once a day    Lisinopril-hydrochlorothiazide 20-12.5 Mg Tabs (Lisinopril-hydrochlorothiazide) .Marland Kitchen..Marland Kitchen Two tablets once daily  Orders: T-Basic Metabolic Panel ([id number])  BP today: 170/84 Prior BP: 148/78 (01/30/2011)  Labs Reviewed: K+: 4.6 (01/29/2011) Creat: : 0.76 (01/29/2011)   Chol: 157 (10/16/2010)   HDL: 66 (10/16/2010)   LDL: 74 (10/16/2010)   TG: 85 (10/16/2010)  Problem # 3:  HYPERLIPIDEMIA (ICD-272.4) Assessment: Comment Only  Her updated medication list for this problem includes:    Crestor 20 Mg Tabs (Rosuvastatin calcium) .Marland Kitchen... Take 1 tab by mouth at bedtime Low fat dietdiscussed and encouraged  Orders: T-Lipid Profile ([id number]) T-Hepatic Function [phone])  Labs Reviewed: SGOT: 13 (10/16/2010)   SGPT: 12 (10/16/2010)   HDL:66 (10/16/2010), 56 (  03/30/2010)  LDL:74 (10/16/2010), 160 (16/10/9603)  Chol:157 (10/16/2010), 239 (03/30/2010)  Trig:85 (10/16/2010), 116 (03/30/2010)  Problem # 4:  DIABETES (ICD-250.00) Assessment: Unchanged  Her updated medication list for this problem includes:    Glucovance 2.5-500 Mg Tabs (Glyburide-metformin) .Marland Kitchen... 2 tablets twice daily    Adult Aspirin Low Strength 81 Mg Tbdp (Aspirin) ..... One tab by mouth qd    Lisinopril-hydrochlorothiazide 20-12.5 Mg Tabs (Lisinopril-hydrochlorothiazide) .Marland Kitchen..Marland Kitchen Two tablets once daily  Orders: T- Hemoglobin A1C ([id number])  Labs Reviewed: Creat: 0.76 (01/29/2011)    Reviewed HgBA1c results: 7.2 (01/29/2011)  7.0 (10/16/2010)  Problem # 5:  GERD (ICD-530.81) Assessment: Improved  Her updated medication list for this problem includes:    Omeprazole 20 Mg Cpdr (Omeprazole) .Marland Kitchen... Take 1  capsule by mouth once a day  Problem # 6:  DEPRESSION (ICD-311) Assessment: Improved  Her updated medication list for this problem includes:    Citalopram Hydrobromide 40 Mg Tabs (Citalopram hydrobromide) .Marland Kitchen... Take 1 tablet by mouth once a day    Lorazepam 0.5 Mg Tabs (Lorazepam) .Marland Kitchen... Take 1 tablet by mouth once a day as needed    Venlafaxine Hcl 150 Mg Xr24h-cap (Venlafaxine hcl) .Marland Kitchen... Take 1 capsule by mouth once a day  Complete Medication List: 1)  Calcium 600 1500 Mg Tabs (Calcium carbonate) .... One tab by mouth two times a day 2)  Gabapentin 300 Mg Caps (gabapentin)  .Marland Kitchen.. 1 by mouth three times a day 3)  Proair Hfa 108 (90 Base) Mcg/act Aers (Albuterol sulfate) .... Inhale two puffs every six hours as needed 4)  Centrum Silver Tabs (Multiple vitamins-minerals) .... One tab by mouth once daily 5)  Pyridostigmine Bromide 60 Mg Tabs (Pyridostigmine bromide) .... One tab by mouth three times a day 6)  Prednisone 5 Mg Tabs (Prednisone) .... Take one tablet in the morning 7)  Glucovance 2.5-500 Mg Tabs (Glyburide-metformin) .... 2 tablets twice daily 8)  Combigan 0.2-0.5 % Soln (Brimonidine tartrate-timolol) .... Uad 9)  Accu-chek Compact Test Drum Strp (Glucose blood) .... Once daily testing dx:250.00 10)  Adult Aspirin Low Strength 81 Mg Tbdp (Aspirin) .... One tab by mouth qd 11)  Citalopram Hydrobromide 40 Mg Tabs (Citalopram hydrobromide) .... Take 1 tablet by mouth once a day 12)  Restasis 0.05 % Emul (Cyclosporine) .... One drop in each eye two times a day 13)  Nu-iron 150 Mg Caps (Polysaccharide iron complex) .... Take 1 tablet by mouth two times a day 14)  Miralax Powd (Polyethylene glycol 3350) .... As needed 15)  Ipratropium-albuterol 0.5-2.5 (3) Mg/34ml Soln (Ipratropium-albuterol) .... Use one vial in nebulizer four times daily as needed 16)  Furosemide 40 Mg Tabs (Furosemide) .... Take 1 tablet by mouth two times a day as needed 17)  Klor-con M20 20 Meq Cr-tabs (Potassium  chloride crys cr) .... Take 1 tablet by mouth three times a day as needed 18)  Crestor 20 Mg Tabs (Rosuvastatin calcium) .... Take 1 tab by mouth at bedtime 19)  Loratadine 10 Mg Tabs (Loratadine) .... One tab by mouth once daily  discontinue allegra 20)  Premarin 0.625 Mg Tabs (Estrogens conjugated) .... Take 1 tablet by mouth once a day 21)  Promethazine Hcl 25 Mg Tabs (Promethazine hcl) .... Take 1 tablet by mouth once a day as needed 22)  Lorazepam 0.5 Mg Tabs (Lorazepam) .... Take 1 tablet by mouth once a day as needed 23)  Diltiazem Hcl Coated Beads 240 Mg Xr24h-cap (Diltiazem hcl coated beads) .... Take 1 tablet by mouth  once a day 24)  Omeprazole 20 Mg Cpdr (Omeprazole) .... Take 1 capsule by mouth once a day 25)  Lisinopril-hydrochlorothiazide 20-12.5 Mg Tabs (Lisinopril-hydrochlorothiazide) .... Two tablets once daily 26)  Th Flax Seed Oil 1000 Mg Caps (Flaxseed (linseed)) .... Take 1 capsule by mouth once a day 27)  Hydrocodone-acetaminophen 10-500 Mg Tabs (Hydrocodone-acetaminophen) .... Take 1 tablet by mouth three times a day 28)  Fluticasone Propionate 50 Mcg/act Susp (Fluticasone propionate) .... One to two puffs per nostril dally as needed for uncontrolled allergies 29)  Venlafaxine Hcl 150 Mg Xr24h-cap (Venlafaxine hcl) .... Take 1 capsule by mouth once a day 30)  Accu-chek Multiclix Lancets Misc (Lancets) .... Once daily testing dx: 250.00 31)  Fluconazole 150 Mg Tabs (Fluconazole) .... Take 1 tablet by mouth once a day for vaginal itch  Other Orders: T-Culture, Urine ([id number]) Medicare Electronic Prescription [id number])  Patient Instructions: 1)  F/u midddle May. 2)  It is important that you exercise regularly at least 20 minutes 5 times a week. If you develop chest pain, have severe difficulty breathing, or feel very tired , stop exercising immediately and seek medical attention. 3)  You need to lose weight. Consider a lower calorie diet and regular exercise.  4)  BMP  prior to visit, ICD-9: 5)  HbgA1C prior to visit, ICD-9: 6)  Hepatic Panel prior to visit, ICD-9:  fasting in mid May. 7)  Lipid Panel prior to visit, ICD-9: Prescriptions: FLUCONAZOLE 150 MG TABS (FLUCONAZOLE) Take 1 tablet by mouth once a day for vaginal itch  #3 x 0   Entered and Authorized by:   Syliva Overman MD   Signed by:   Syliva Overman MD on 03/01/2011   Method used:   Electronically to        [facility] (retail)       [address]       [place], [place]  [address]       Ph: [phone]       Fax: [phone]   RxID:   [id number]    Orders Added: 1)  Est. Patient Level IV [10932] 2)  T-Culture, Urine [[id number]] 3)  T-Basic Metabolic Panel [[id number]] 4)  T-Lipid Profile [80061-22930] 5)  T-Hepatic Function [80076-22960] 6)  T- Hemoglobin A1C [83036-23375] 7)  Medicare Electronic Prescription [K2706]

## 2011-03-14 ENCOUNTER — Encounter: Payer: Self-pay | Admitting: Family Medicine

## 2011-03-15 LAB — CONVERTED CEMR LAB
Alkaline Phosphatase: 132 units/L — ABNORMAL HIGH (ref 39–117)
BUN: 17 mg/dL (ref 6–23)
Bilirubin, Direct: <0.1 mg/dL (ref 0.0–0.3)
Chloride: 105 meq/L (ref 96–112)
Cholesterol: 175 mg/dL (ref 0–200)
Creatinine, Ser: 0.85 mg/dL (ref 0.40–1.20)
Glucose, Bld: 60 mg/dL — ABNORMAL LOW (ref 70–99)
LDL Cholesterol: 89 mg/dL (ref 0–99)
Total Protein: 6.9 g/dL (ref 6.0–8.3)
Triglycerides: 79 mg/dL (ref <150)
VLDL: 16 mg/dL (ref 0–40)

## 2011-03-20 NOTE — Miscellaneous (Signed)
  Clinical Lists Changes  Medications: Added new medication of LANTUS SOLOSTAR 100 UNIT/ML SOLN (INSULIN GLARGINE) 20 units once daily, start at 7 units and increase every 3 days if needed - Signed Rx of LANTUS SOLOSTAR 100 UNIT/ML SOLN (INSULIN GLARGINE) 20 units once daily, start at 7 units and increase every 3 days if needed;  #600 units x 3;  Signed;  Entered by: Syliva Overman MD;  Authorized by: Syliva Overman MD;  Method used: Historical    Prescriptions: LANTUS SOLOSTAR 100 UNIT/ML SOLN (INSULIN GLARGINE) 20 units once daily, start at 7 units and increase every 3 days if needed  #600 units x 3   Entered and Authorized by:   Syliva Overman MD   Signed by:   Syliva Overman MD on 03/14/2011   Method used:   Historical   RxID:   [id number]

## 2011-04-04 ENCOUNTER — Encounter: Payer: Medicare Other | Admitting: Family Medicine

## 2011-04-04 LAB — BLOOD GAS, ARTERIAL
Bicarbonate: 22.5 mEq/L (ref 20.0–24.0)
FIO2: 0.21 %
O2 Saturation: 92.4 %
pCO2 arterial: 43.1 mmHg (ref 35.0–45.0)
pO2, Arterial: 69.1 mmHg — ABNORMAL LOW (ref 80.0–100.0)

## 2011-04-07 LAB — GLUCOSE, CAPILLARY
Glucose-Capillary: 172 mg/dL — ABNORMAL HIGH (ref 70–99)
Glucose-Capillary: 202 mg/dL — ABNORMAL HIGH (ref 70–99)
Glucose-Capillary: 415 mg/dL — ABNORMAL HIGH (ref 70–99)

## 2011-04-07 LAB — HEMOGLOBIN AND HEMATOCRIT, BLOOD
HCT: 28.3 % — ABNORMAL LOW (ref 36.0–46.0)
HCT: 32.8 % — ABNORMAL LOW (ref 36.0–46.0)
Hemoglobin: 10.8 g/dL — ABNORMAL LOW (ref 12.0–15.0)

## 2011-04-07 LAB — POCT I-STAT, CHEM 8
Chloride: 107 mEq/L (ref 96–112)
Glucose, Bld: 103 mg/dL — ABNORMAL HIGH (ref 70–99)
HCT: 32 % — ABNORMAL LOW (ref 36.0–46.0)
Hemoglobin: 10.9 g/dL — ABNORMAL LOW (ref 12.0–15.0)
Potassium: 5.2 mEq/L — ABNORMAL HIGH (ref 3.5–5.1)
Sodium: 136 mEq/L (ref 135–145)

## 2011-04-12 ENCOUNTER — Other Ambulatory Visit: Payer: Self-pay | Admitting: Family Medicine

## 2011-04-12 ENCOUNTER — Ambulatory Visit: Payer: Self-pay | Admitting: Family Medicine

## 2011-04-16 LAB — BASIC METABOLIC PANEL
CO2: 30 mEq/L (ref 19–32)
Chloride: 100 mEq/L (ref 96–112)
Creatinine, Ser: 0.78 mg/dL (ref 0.4–1.2)
GFR calc Af Amer: >60 mL/min (ref >60)
Sodium: 138 mEq/L (ref 135–145)

## 2011-04-16 LAB — CBC
Hemoglobin: 8.9 g/dL — ABNORMAL LOW (ref 12.0–15.0)
MCHC: 32.3 g/dL (ref 30.0–36.0)
MCV: 85.7 fL (ref 78.0–100.0)
RBC: 3.22 MIL/uL — ABNORMAL LOW (ref 3.87–5.11)
WBC: 12.4 10*3/uL — ABNORMAL HIGH (ref 4.0–10.5)

## 2011-04-17 LAB — HEMOGLOBIN AND HEMATOCRIT, BLOOD
HCT: 28 % — ABNORMAL LOW (ref 36.0–46.0)
Hemoglobin: 9.1 g/dL — ABNORMAL LOW (ref 12.0–15.0)

## 2011-05-03 ENCOUNTER — Encounter: Payer: Self-pay | Admitting: Urgent Care

## 2011-05-07 ENCOUNTER — Ambulatory Visit (INDEPENDENT_AMBULATORY_CARE_PROVIDER_SITE_OTHER): Payer: Medicare Other | Admitting: Family Medicine

## 2011-05-07 ENCOUNTER — Encounter: Payer: Self-pay | Admitting: Urgent Care

## 2011-05-07 ENCOUNTER — Ambulatory Visit (INDEPENDENT_AMBULATORY_CARE_PROVIDER_SITE_OTHER): Payer: Medicare Other | Admitting: Urgent Care

## 2011-05-07 ENCOUNTER — Telehealth: Payer: Self-pay | Admitting: Family Medicine

## 2011-05-07 VITALS — BP 143/68 | HR 61 | Temp 97.8°F | Ht 66.0 in | Wt 278.6 lb

## 2011-05-07 DIAGNOSIS — Q393 Congenital stenosis and stricture of esophagus: Secondary | ICD-10-CM

## 2011-05-07 DIAGNOSIS — K222 Esophageal obstruction: Secondary | ICD-10-CM

## 2011-05-07 DIAGNOSIS — K5900 Constipation, unspecified: Secondary | ICD-10-CM

## 2011-05-07 DIAGNOSIS — N3090 Cystitis, unspecified without hematuria: Secondary | ICD-10-CM

## 2011-05-07 DIAGNOSIS — K219 Gastro-esophageal reflux disease without esophagitis: Secondary | ICD-10-CM

## 2011-05-07 DIAGNOSIS — E119 Type 2 diabetes mellitus without complications: Secondary | ICD-10-CM

## 2011-05-07 LAB — POCT URINALYSIS DIPSTICK
Glucose, UA: NEGATIVE
Leukocytes, UA: NEGATIVE
Nitrite, UA: NEGATIVE
Urobilinogen, UA: 1

## 2011-05-07 LAB — GLUCOSE, POCT (MANUAL RESULT ENTRY): POC Glucose: 107

## 2011-05-07 NOTE — Assessment & Plan Note (Signed)
[name] is a 70 y.o. black female w/ chronic GERD & Schatzki's ring requiring dilation.  Doing very well on prilosec 20mg  daily.

## 2011-05-07 NOTE — Progress Notes (Signed)
Referring Provider: Syliva Overman, MD Primary Care Physician:  Syliva Overman, MD, MD Primary Gastroenterologist:  Dr. Jena Gauss  Chief Complaint  Patient presents with  . Follow-up    HPI:  [name] is a 70 y.o. female here for follow up for dysphagia in the setting of chronic GERD & Schatzki's ring s/p recent dilation.  Doing well.  Dysphagia resolved.  GERD well-controlled.  Started insulin 03/16/2011 by Dr. Lodema Hong.  On prilosec 20mg  daily.  Denies any abd pain or problems w/ her bowels.  Taking probiotics daily w/ Align & is very satisfied w/ this.  Last hgb 11.2 on 2/26 which is much improved.     Past Medical History  Diagnosis Date  . Diabetes mellitus   . Hypertension   . Tobacco abuse   . Myasthenia gravis   . Arthritis   . Gastroesophageal reflux   . Thyroid disease   . Sleep apnea   . Iron deficiency anemia   . Depression   . Hyperlipidemia   . Obesity   . Carpal tunnel syndrome   . Shoulder pain   . Impingement syndrome, shoulder   . Pulmonary HTN   . Mitral regurgitation   . Schatzki's ring     Last EGD w/ dilation 02/08/11, 2009 & 2007  . Esophagitis, erosive 2009  . Diverticulosis 07/2004    Colonscopy Dr Jena Gauss    Past Surgical History  Procedure Date  . Rt tka 05/13/07    Dr. Romeo Apple  . Right carpal tunnel release 02/01/2009  . Incontinence surgery 08/26/09    [facility]baum  . Cholecystectomy   . Abdominal hysterectomy   . Eye surgery     Current Outpatient Prescriptions  Medication Sig Dispense Refill  . albuterol (PROVENTIL) (2.5 MG/3ML) 0.083% nebulizer solution Take 2.5 mg by nebulization every 6 (six) hours as needed.        . citalopram (CELEXA) 40 MG tablet 40 mg daily.       . cycloSPORINE (RESTASIS) 0.05 % ophthalmic emulsion Place 1 drop into both eyes 2 (two) times daily.        Marland Kitchen diltiazem (CARDIZEM CD) 240 MG 24 hr capsule Take 1 capsule (240 mg total) by mouth daily.  30 each  1  . diltiazem (DILACOR XR) 240 MG 24 hr capsule  Take 240 mg by mouth daily.        Marland Kitchen estrogens, conjugated, (PREMARIN) 0.625 MG tablet Take 0.625 mg by mouth daily. Take daily for 21 days then do not take for 7 days.       . furosemide (LASIX) 40 MG tablet Take 40 mg by mouth 2 (two) times daily.        Marland Kitchen gabapentin (NEURONTIN) 300 MG capsule Take 300 mg by mouth 3 (three) times daily.        Marland Kitchen glyBURIDE-metformin (GLUCOVANCE) 2.5-500 MG per tablet Take 2 tablets by mouth daily with breakfast.        . HYDROcodone-acetaminophen (LORTAB) 10-500 MG per tablet Take 1 tablet by mouth 3 (three) times daily.        . insulin glargine (LANTUS) 100 UNIT/ML injection Inject 20 Units into the skin at bedtime. Solo Star pen 100 units/53ml      . iron polysaccharides (NIFEREX) 150 MG capsule Take 150 mg by mouth 2 (two) times daily.        Marland Kitchen loratadine (CLARITIN) 10 MG tablet Take 10 mg by mouth daily.        Marland Kitchen LORazepam (ATIVAN) 0.5  MG tablet Take 0.5 mg by mouth every 8 (eight) hours.        Marland Kitchen oxybutynin (DITROPAN XL) 10 MG 24 hr tablet Take 10 mg by mouth daily.        . polyethylene glycol (MIRALAX / GLYCOLAX) packet Take 17 g by mouth as needed.        . potassium chloride (KLOR-CON) 20 MEQ packet Take 20 mEq by mouth 3 (three) times daily.        . predniSONE (DELTASONE) 5 MG tablet Take 5 mg by mouth daily.        Marland Kitchen PRILOSEC 20 MG capsule TAKE ONE CAPSULE BY MOUTHDAILY.  30 each  1  . Probiotic Product (ALIGN PO) Take 4 mg by mouth daily.        . promethazine (PHENERGAN) 25 MG tablet Take 25 mg by mouth 1 dose over 46 hours.        . rosuvastatin (CRESTOR) 20 MG tablet Take 20 mg by mouth at bedtime.        Marland Kitchen ZESTORETIC 20-12.5 MG per tablet TAKE (2) TABLETS BY MOUTHAT BEDTIME.  60 each  1  . fluconazole (DIFLUCAN) 150 MG tablet       . venlafaxine (EFFEXOR-XR) 150 MG 24 hr capsule         Allergies as of 05/07/2011  . (No Known Allergies)    Family History:  There is no known family history of colorectal carcinoma , liver disease, or  inflammatory bowel disease.   History   Social History  . Marital Status: Single    Spouse Name: N/A    Number of Children: N/A  . Years of Education: N/A   Occupational History  . disabled    Social History Main Topics  . Smoking status: Former Smoker -- 1.0 packs/day for 25 years    Quit date: 01/01/2004  . Smokeless tobacco: Not on file  . Alcohol Use: No  . Drug Use: No  . Sexually Active: Not on file   Review of Systems: Gen: Denies any fever, chills, sweats, anorexia, fatigue, weakness, malaise, weight loss, and sleep disorder CV: Denies chest pain, angina, palpitations, syncope, orthopnea, PND, peripheral edema, and claudication. Resp: Denies dyspnea at rest, dyspnea with exercise, cough, sputum, wheezing, coughing up blood, and pleurisy. GI: Denies vomiting blood, jaundice, and fecal incontinence.   Denies dysphagia or odynophagia. Derm: Denies rash, itching, dry skin, hives, moles, warts, or unhealing ulcers.  Psych: Denies depression, anxiety, memory loss, suicidal ideation, hallucinations, paranoia, and confusion. Heme: Denies bruising, bleeding, and enlarged lymph nodes.  Physical Exam: BP 143/68  Pulse 61  Temp(Src) 97.8 F (36.6 C) (Tympanic)  Ht 5\' 6"  (1.676 m)  Wt 278 lb 9.6 oz (126.372 kg)  BMI 44.97 kg/m2 General:   Alert,  Well-developed, obese, pleasant and cooperative in NAD Head:  Normocephalic and atraumatic. Eyes:  Sclera clear, no icterus.   Conjunctiva pink. Mouth:  No deformity or lesions, dentition normal. Neck:  Supple; no masses or thyromegaly. Heart:  Regular rate and rhythm; 2/6 murmur noted, clicks, rubs,  or gallops. Abdomen:  Soft, nontender and nondistended. No masses, hepatosplenomegaly or hernias noted. Normal bowel sounds, without guarding, and without rebound.   Msk:  Symmetrical without gross deformities. Normal posture. Pulses:  Normal pulses noted. Extremities:  1+ pitting PT edema bilat.. Neurologic:  Alert and  oriented x4;   grossly normal neurologically. Skin:  Intact without significant lesions or rashes. Cervical Nodes:  No significant cervical adenopathy. Psych:  Alert and cooperative. Normal mood and affect.    

## 2011-05-07 NOTE — Telephone Encounter (Signed)
ccua in office today,pls doc symptoms, will treat if abn

## 2011-05-07 NOTE — Telephone Encounter (Signed)
May i send patient to lab for ccua?

## 2011-05-07 NOTE — Patient Instructions (Signed)
-  Continue Prilosec 20mg daily

## 2011-05-07 NOTE — Assessment & Plan Note (Signed)
Chronic constipation improved w/ probiotics.  Due for screening colonoscopy 2015.

## 2011-05-15 ENCOUNTER — Other Ambulatory Visit: Payer: Self-pay

## 2011-05-15 ENCOUNTER — Other Ambulatory Visit: Payer: Self-pay | Admitting: Family Medicine

## 2011-05-15 DIAGNOSIS — M25559 Pain in unspecified hip: Secondary | ICD-10-CM

## 2011-05-15 MED ORDER — HYDROCODONE-ACETAMINOPHEN 10-500 MG PO TABS: 1.0000 | ORAL_TABLET | Freq: Three times a day (TID) | ORAL | Status: DC

## 2011-05-15 NOTE — Op Note (Signed)
[name], [name]              ACCOUNT NO.:  [id number]   MEDICAL RECORD NO.:  [id number]          PATIENT TYPE:  AMB   LOCATION:  DAY                           FACILITY:  [facility]   PHYSICIAN:  R. Roetta Sessions, M.D. DATE OF BIRTH:  04-13-1941   DATE OF PROCEDURE:  DATE OF DISCHARGE:                               OPERATIVE REPORT   INDICATIONS FOR PROCEDURE:  A 67-year lady with recurrent esophageal  dysphagia and worsening of her typical heartburn symptoms.  EGD is now  being done.  Potential esophageal dilation reviewed.  Risks, benefits,  alternatives have been discussed.  Please see documentation in the  medical record.   PROCEDURE NOTE:  O2 saturation, blood pressure, pulse, and respirations  were monitored throughout the entire procedure.   CONSCIOUS SEDATION:  Versed 5 mg IV, Demerol 100 mg IV in divided doses.   INSTRUMENT:  Pentax video chip system.   FINDINGS:  Examination of the tubular esophagus revealed a noncritical  Schatzki ring.  There were a couple of tiny distal esophageal erosions  superimposed on the ring.  There is no Barrett esophagus or other  abnormality.  EG junction easily traversed.  Stomach:  Gastric cavity  was emptied, insufflated well with air.  A thorough examination of the  gastric mucosa including retroflexion of the proximal stomach  esophagogastric junction demonstrated only a small hiatal hernia.  Pylorus patent, easily traversed.  Examination of the bulb and second  portion revealed no abnormalities.  Therapeutic/diagnostic maneuvers  performed:  Scope was withdrawn and a 58-French Maloney dilator was  passed to full insertion with ease.  A look back revealed the ring  remained intact.  Subsequently, using the jumbo biopsy forceps, 2  quadrant bites of the ring were taken and this was done effectively  without apparent complication.  The patient tolerated the procedure  well, was reactive to endoscopy.   IMPRESSION:  1. Distal esophageal  erosion consistent with mild erosive reflux      esophagitis.  2. Noncritical Schatzki ring, small hiatal hernia otherwise upper      gastrointestinal tract appeared unremarkable, status post passage      of a Maloney dilation to biopsy disruption of the ring described      above.   RECOMMENDATIONS:  Increase Aciphex to 20 mg orally twice daily, i.e.,  before breakfast and supper.  Followup appointment with [place] to assess her  progress in 3 months.      [name], M.D.  Electronically Signed     RMR/MEDQ  D:  10/26/2008  T:  10/27/2008  Job:  [id number]   cc:   Milus Mallick. Lodema Hong, M.D.  Fax: [phone]

## 2011-05-15 NOTE — Op Note (Signed)
NAMESHARLOT, STURKEY NO.:  [id number]   MEDICAL RECORD NO.:  [id number]          PATIENT TYPE:  INP   LOCATION:  A320                          FACILITY:  [facility]   PHYSICIAN:  Vickki Hearing, M.D.DATE OF BIRTH:  22-Mar-1941   DATE OF PROCEDURE:  05/13/2007  DATE OF DISCHARGE:                               OPERATIVE REPORT   PREOPERATIVE DIAGNOSIS:  Osteoarthritis right knee.   POSTOPERATIVE DIAGNOSIS:  Osteoarthritis right knee.   OPERATION/PROCEDURE:  Right total knee arthroplasty, computer assisted  surgery.   IMPLANTS:  We used a DePuy #3 femur, #3 tibia, size 10 insert and 32  patella.   INDICATIONS:  This is a 70 year old female who complained of right knee  pain, trouble getting up and down out of a chair and climbing stairs.  Primarily pain was medial.  Her pain became disabling and she presented  for total knee replacement.   SURGEON:  Vickki Hearing, M.D.   ASSISTANTS:  Deniece Portela McFadder and Tobias Nation.   ANESTHESIA:  Spinal.   SPECIMENS:  Bone fragments from femoral cuts.  They went to pathology.   ESTIMATED BLOOD LOSS:  There was no blood loss.   TOURNIQUET TIME:  Exactly 2 hours.   COMPLICATIONS:  No complications.   DISPOSITION:  The patient went to PACU in good condition.   DESCRIPTION OF PROCEDURE:  The patient was identified in preop holding  area as Fausto Skillern with a date of birth of 1941/07/23.  The  right knee was marked for surgery by the patient, countersigned by the  surgeon.  History and physical update was completed.  The patient was  taken to the operating room for spinal anesthetic.  Antibiotics were  started.  A Foley catheter was inserted.  The right knee was prepped  with DuraPrep and draped sterilely.  The tourniquet was elevated to 350  mmHg after exsanguination of the limb with a 6-inch Esmarch.   The knee incision was made in flexion down to the extensor mechanism.  A  medial arthrotomy was  performed.  The patella was everted.  Medial soft  tissue sleeve was elevated.  Fat pad was resected.  The ACL, PCL and  medial and lateral menisci were removed  Two pins were placed in the femur and two in the tibia for the computer  markers.  The cutting block was set off the computer for a 0 degree  slope, 10 mm of bone to be resected from the good side.  This was  pinned, verified and the bone cut was made.  (Prior to the first cut, the femoral and tibial bony landmarks were  verified as required by the computer-assisted device.   For the distal femoral cut using the computer as well, we resected 9 mm  from the good side.  Upon verification and spacer block check, we took  an additional 4 mm of bone and were able to get the spacer block in  place.  Collateral ligaments seem stable in extension.  Rectangular  space was obtained.   With the assistance of the computer, we  made the four distal cuts.  The  box cut verified the cuts with the computer assistance, placed a 10 mm  spacer block in the flexion space, found that to be stable and with good  balance.  Alignment was good in flexion and extension.  We then put the  trials in place, ranged the knee several times, marked the tibia,  punched the tibia, drilled the femoral lug holes and then resected the  patella from a 20 down to a 10, drilled the three peg holes.   We then irrigated the knee, then punched the tibial base plate and then  we cemented the prostheses in place, removed excess cement, to a final  measurement off the computer, found our alignment to be within a degree  of 0 degrees.  Femoral head to tibial  plafond alignment was 0 to 0.5  degrees. Collateral ligaments were within a millimeter in extension and  flexion.   Final implant for the polyethylene was a tendon that gave [place] full  extension and 111 degrees of flexion.   We injected 30 mL of Marcaine with epinephrine into the soft tissues,  placed a pain pump  catheter in the subfascial plane, sewed the fascia  with #1 Bralon and then the subcu fat with 0- and 2-0 Monocryl placed  staples.  No drain.  The patient's wounds were dressed sterilely.  A  cryo cuff was applied, Ace wrap from toe to groin.  The patient went  recovery room in stable condition.      Vickki Hearing, M.D.  Electronically Signed     SEH/MEDQ  D:  05/13/2007  T:  05/13/2007  Job:  [id number]

## 2011-05-15 NOTE — Discharge Summary (Signed)
[name], [name] NO.:  [id number]   MEDICAL RECORD NO.:  [id number]          PATIENT TYPE:  INP   LOCATION:  A320                          FACILITY:  [facility]   PHYSICIAN:  Vickki Hearing, M.D.DATE OF BIRTH:  12-19-1941   DATE OF ADMISSION:  05/13/2007  DATE OF DISCHARGE:  05/17/2008LH                               DISCHARGE SUMMARY   ADMITTING DIAGNOSIS:  Osteoarthritis, right knee.   DISCHARGE DIAGNOSIS:  Osteoarthritis, right knee.   PROCEDURE:  Right total knee arthroplasty.   PROCEDURE:  Computer-assisted right total knee arthroplasty.   OPERATIVE FINDINGS:  Severe arthritis of the knee.  All compartments  involved.  Indication was disabling pain.   IMPLANTS:  We used a DePuy 3 femur, 3 tibia, size 10-mm insert and a 32  patella.   HOSPITAL COURSE:  This patient had no complications during her hospital  course.  She did well with the therapy.  We got her pain under control  and she was discharged home with ambulation of 140 feet with rolling  walker, standby assist, independent bed mobility, range of motion zero  to 90.   MEDICATIONS:  She was going to take:  1. Glyburide/metformin 2.5/500 p.o. b.i.d.  2. Prednisone 5 mg daily.  3. Iron 325 b.i.d. (prednisone)  4. Caduet 5/80, one daily.  5. Januvia 100 mg p.o. daily.  6. Methimazole 10 mg daily.  7. Furosemide 40 mg daily.  8. Furosemide 80 mg q.a.m. both furosemide are p.r.n. meds.  9. Diltiazem ER 240 b.i.d.  10.Pyridostigmine ER 60 mg t.i.d.  11.Avalide 300/25 p.o. daily.  12.Prednisone was changed to 20 mg daily x3 days, 10 mg for 3 days,      and 5 mg daily, as previously taking.  13.Lorcet plus one q.4 h. #90, with five refills.   INSTRUCTIONS:  Use Cryo/Cuff  an hour three times a day, CPM advance 0-  80 - increase 10 degrees per day as tolerated.  Use 8 hours a day.  Staples out May 23.   FOLLOW UP:  May 27, 2007.   Home PT and home nurse.   Weightbear as tolerated.   CONDITION ON DISCHARGE:  Stable.      Vickki Hearing, M.D.  Electronically Signed     SEH/MEDQ  D:  06/13/2007  T:  06/13/2007  Job:  [id number]

## 2011-05-15 NOTE — H&P (Signed)
NAMECORAIMA, [name]              ACCOUNT NO.:  [id number]   MEDICAL RECORD NO.:  [id number]          PATIENT TYPE:  AMB   LOCATION:  DAY                           FACILITY:  [facility]   PHYSICIAN:  R. Roetta Sessions, M.D. DATE OF BIRTH:  10/16/1941   DATE OF ADMISSION:  DATE OF DISCHARGE:  LH                              HISTORY & PHYSICAL   REFERRING PHYSICIAN:  Milus Mallick. Lodema Hong, MD   REASON FOR CONSULTATION:  Burning and she has dysphagia.   HISTORY OF PRESENT ILLNESS:  [name] is a pleasant 70-year African  American female with multiple medical problems including osteoarthritis,  type 2 diabetes mellitus, has had longstanding reflux symptoms who comes  to see me at the request of Dr. Lodema Hong.  She has been fairly well,  maintained on Aciphex 20 mg orally daily for the past couple of years  for reflux, but she has had worsening of nocturnal retrosternal burning  and recurrence of esophageal dysphagia.  She does have myasthenia  gravis.  We last performed EGD on [name] in November 2007 and  she was found to have a Schatzki's ring, which was dilated and disrupted  and a small hiatal hernia.  She also underwent a gastric emptying study  back in December 2007 which was well within normal limits with 98%  gastric emptying in 2 hours.  She tells me she has gotten used to  nodding but sleeping in her bed.  She sleeps in a recliner to stay  upright to minimize nocturnal regurgitation symptoms.  She is not having  any odynophagia.  No abdominal pain.  She is on iron supplementation.  Her stools are chronically dark and she is relatively constipated.  Has  not had any hematemesis or hematochezia or melena.  Otherwise, it was  good to know she had a colonoscopy performed in 2005 which demonstrated  only pancolonic diverticula.  She tells me she has been anemic all of  her life, and in fact, a CBC from September 17, 2008, through Dr.  Anthony Sar office demonstrated H and H 8.8 and  29.1 with an MCV of 87.9.  Moreover, [name] tells me Dr. Lodema Hong has checked her stool for  Hemoccult and they come back negative recently.  She does state she  takes nonsteroidals but currently takes nothing more than an 81 mg  aspirin in that category.  Currently, she previously was on ibuprofen.   PAST MEDICAL HISTORY:  1. Type 2 diabetes mellitus.  2. Hypertension.  3. Gastroesophageal reflux disease.  4. Mysthenia gravis.  5. Sinusitis.  6. Bronchitis.  7. Arthritis.   PRIOR SURGERIES:  1. Cholecystectomy.  2. Hysterectomy.  3. A lipoma removed from left arm.  4. Tonsillectomy.  5. Right total knee replacement.   CURRENT MEDICATIONS:  1. Glyburide/metformin 2.5/500 mg 2 tablets b.i.d.  2. Flaxseed oil 1200 mg daily.  3. Calcium 600 vitamin D daily.  4. Cranberry supplement once daily.  5. Diltiazem 240 mg b.i.d.  6. Avalide 300/25 mg daily.  7. ASA 81 mg daily.  8. Methimazole 10 mg daily.  9.  Premarin 0.625 mg p.r.n.  10.Aciphex 20 mg orally daily taken in the morning.  11.Prednisone 5 mg daily.  12.Fexofenadine 180 mg daily.  13.Lorazepam 0.5 mg daily.  14.Iron supplement daily.  15.Pyridostigmine BR 60 mg t.i.d.  16.Caduet 5/80 mg daily.  17.Nasacort p.r.n.  18.Restasis eye drops.  19.Hydrocodone/APAP b.i.d. p.r.n.   ALLERGIES:  No known drug allergies   FAMILY HISTORY:  Negative for chronic GI or liver illness, specifically  negative for GI neoplasia.   SOCIAL HISTORY:  The patient is not married.  She is on disability.  Prior smoker, none in 4-1/2 years.  No alcohol.  No illicit drugs.  No  recent chest pain or dyspnea on exertion.  She has gained 8 pounds in  the past 2 years by our scales.  No night sweats, fevers.   PHYSICAL EXAMINATION:  GENERAL:  Pleasant, obese 70-year Washington resting  comfortably, weight 274, height 5 feet 6 inches, temp 98, BP 132/66,  pulse 64.  SKIN:  Warm and dry.  HEENT:  No scleral icterus.  Conjunctivae pink.   CHEST:  Lungs are clear to auscultation.  HEART:  Regular rate and rhythm with a 2/6 systolic ejection murmur.  BREASTS:  Deferred.  ABDOMEN:  Obese, positive bowel sounds.  No succussion splash.  Abdomen  is soft, nontender without obvious mass or megaly.  EXTREMITIES:  No edema.   IMPRESSION:  [name] is a pleasant 70 year old Washington with  numerous medical problems including the diabetes and myasthenia gravis  who has recurrent esophageal dysphagia what sounds like worsening of  nocturnal gastroesophageal reflux disease symptoms in spite of once  daily proton pump inhibitor therapy in the way of Aciphex.   She is significantly over her ideal body weight and has gained  additional weight in the past 2 years since we have last saw Jenna nice  Washington.   RECOMMENDATIONS:  Because of her esophageal dysphagia, she opted to go  ahead and have a repeat EGD at Jenna time.  Potential for esophageal  dilation reviewed with [name].  Risks, benefits, alternatives, and  limitations have been discussed, questions answered, and she is  agreeable.  We will plan for an EGD with esophageal dilation as  appropriate in the very near future.  She may need modification in her  acid suppression regimen.  It is good to know that at least 2 years ago  she had a very good gastric emptying.  Her myasthenia gravis may be  playing a role in her upper GI tract symptoms.  Her anemia is striking.  Apparently, she has been found to be Hemoccult negative.  She has been  on nonsteroidal agents recently.  For further recommendations to follow  in the very near future.   I would thank Dr. Reginia Naas to allowing me to see [name] once  again.      Jonathon Bellows, M.D.  Electronically Signed     RMR/MEDQ  D:  10/14/2008  T:  10/14/2008  Job:  [id number]   cc:   Milus Mallick. Lodema Hong, M.D.  Fax: [phone]

## 2011-05-15 NOTE — Op Note (Signed)
NAMECASADY, VOSHELL              ACCOUNT NO.:  [id number]   MEDICAL RECORD NO.:  [id number]          PATIENT TYPE:  INP   LOCATION:  1433                         FACILITY:  [facility]   PHYSICIAN:  Sigmund I. Patsi Sears, M.D.DATE OF BIRTH:  [birth year]   DATE OF PROCEDURE:  08/26/2009  DATE OF DISCHARGE:                               OPERATIVE REPORT   PREOPERATIVE DIAGNOSES:  1. Rectocele.  2. Stress urinary incontinence.  3. Apical prolapse.   PROCEDURE PERFORMED:  1. Rectocele posterior repair (Pinnacle).  2. Apical repair.  3. Urethral sling (Solyx).  4. Cystourethroscopy.  5. Bilateral insertion of the open-ended ureteral catheters.   SURGEON:  Dr. Jethro Bolus, M.D.   RESIDENT:  Magda Paganini, M.D.   ANESTHESIA:  Spinal.   COMPLICATIONS:  None.   DRAINS:  A 16-French Foley catheter.   OPERATIVE FINDINGS:  1. The patient had a large posterior defect as well as an apical      defect.  2. Blue indigo carmine was seen effluxing from bilateral ureteral      orifices at the termination of the procedure.   INDICATIONS FOR PROCEDURE:  Ms. Icenhour is a 70 year old African  American female with diabetes, myasthenia gravis, and multiple medical  comorbidities.  She had bothersome lower urinary tract symptoms and was  noted to also complain of stress urinary incontinence.  She had mixed  symptoms, however was noted to have an unstable bladder on a filling  cystogram as well as a Valsalva leak point pressure of 86 cm of water.  She was able to generate an adequate detrusor contraction.  She  responded poorly to medical therapy and therefore was offered surgical  management of her pelvic organ prolapse and stress urinary incontinence.   DESCRIPTION OF PROCEDURE IN DETAIL:  Ms. Callicott was brought back to  the operating room and correctly identified by her wristband.  A  preoperative time-out was called to confirm the correct patient,  procedure, and site.  After  successful  induction of spinal anesthesia,  she was positioned in the dorsolithotomy position.  Inspection was  accomplished to protect her bony prominences.  IV antibiotics were  administered.  Her perineum and vagina were prepped and draped in the  usual sterile fashion.  Surgeons wore sterile gown and gloves.  The  cystoscope was passed transurethrally into the bladder.  Pan cystoscopy  was performed.  This revealed a normal urethra.  The bladder was normal  in shape and capacity.  There was no evidence of any abnormal  diverticular lesions, stones, and foreign bodies.  Bilateral orifices  were noted in the usual anatomic position and seen to be effluxing clear-  yellow urine.    Attention was then turned to the left ureteral orifice.  The left  ureteral orifice was gently cannulated with a sensor wire, and the wire  was advanced up into the right collecting system.  A 6 French open-ended  catheter was advanced over the wire via Seldinger technique.  Attention  was then turned to the contralateral ureteral orifice.  This was  cannulated with a sensor wire, which  was advanced up into the renal  collecting system.  A second end-hole catheter was inserted up and  advanced up to the collecting system.  Therefore having 2 ureteral  catheters in place, the cystoscope was removed.  The Foley catheter was  inserted, and the ureteral catheters were fastened to the Foley  catheter.  These were then set aside.  Attention was then turned to the  vagina.   Weighted speculum was put in place, and the compartments were examined.  The patient had an apical defect as well as a large rectocele in the  posterior compartment.  Hydrodissection of the posterior vaginal wall  was performed using a lidocaine-epinephrine mixture.  We then made our  planned incision from the posterior fourchette up to the apex.  We  dissected using a combination of sharp and blunt dissection in the  posterior compartment.   Dissected up to the sacrospinalis ligaments  bilaterally.  We were able to palpate the sacrospinalis ligaments with  some difficulty; however, the ischial spine was clearly identified and 1  fingerbreadth medial to that, the sacrospinalis ligaments were found on  each side.   The Capio device was then used to suture the lateral limb of the mesh in  place.  The pedicle kit was used for the procedure.  With the lateral  arms in place, the mesh was then pulled up and it had an excellent lie.  The apical portion was then tacked in place using 3-0 Vicryl.  The table  of the mesh was also packed in place laterally using 3-0 Vicryl.  The  wound was then copiously irrigated with antibiotic solution.  Excellent  hemostasis was ensured.  The vaginal wound was then closed using 3-0  Vicryl in a Mercedes fashion.   Attention was then turned to the mid urethra.  Hydrodissection with  lidocaine-epinephrine mixture was performed.  A 15 mm incision was made  over the mid urethra, was then dissected laterally towards the obturator  fascia.  Using the Solyx sling kit, the passer was placed through the  obturator foramen and into the obturator internus fascia.  This was  performed bilaterally.  The mesh was pulled up into place with making  sure that too much tension was not placed on the mid urethra.  By  placing a Kelly clamp behind the mesh, there was adequate space behind,  and the mesh was not too tight.  This wound was then closed using 3-0  Vicryl in a running fashion.   Cystoscopy was then performed after indigo carmine had been  administered.  There was blue efflux of dye seen from the bilateral  ureteral orifices.  There were no injuries noted to the bladder.  Foley  catheter was then reinserted and was inflated with 10 mL sterile water.  Estrogen packs were then placed into the vagina.  Two packs were in  place.  It was at that point that the procedure was terminated.  Please  note that Dr.  Jethro Bolus was present and available and  participated in all aspects of this patient's care.   DISPOSITION:  Patient tolerated the procedure well, was extubated and  transported to the PACU in stable condition.      Delman Kitten, MD      Sigmund I. Patsi Sears, M.D.  Electronically Signed    DW/MEDQ  D:  08/26/2009  T:  08/27/2009  Job:  [id number]

## 2011-05-15 NOTE — Op Note (Signed)
NAMESENETRA, DILLIN NO.:  [id number]   MEDICAL RECORD NO.:  [id number]          PATIENT TYPE:  INP   LOCATION:  A320                          FACILITY:  [facility]   PHYSICIAN:  Vickki Hearing, M.D.DATE OF BIRTH:  September 05, 1941   DATE OF PROCEDURE:  05/13/2007  DATE OF DISCHARGE:                               OPERATIVE REPORT   PREOPERATIVE DIAGNOSIS:  Osteoarthritis right knee.   POSTOPERATIVE DIAGNOSIS:  Osteoarthritis right knee.   OPERATION/PROCEDURE:  Right total knee arthroplasty, computer assisted  surgery.   IMPLANTS:  We used a DePuy #3 femur, #3 tibia, size 10 insert and 32  patella.   INDICATIONS:  This is a 70 year old female who complained of right knee  pain, trouble getting up and down out of a chair and climbing stairs.  Primarily pain was medial.  Her pain became disabling and she presented  for total knee replacement.   SURGEON:  Vickki Hearing, M.D.   ASSISTANTS:  Deniece Portela McFadder and [place] Nation.   ANESTHESIA:  Spinal.   SPECIMENS:  Bone fragments from femoral cuts.  They went to pathology.   ESTIMATED BLOOD LOSS:  There was no blood loss.   TOURNIQUET TIME:  Exactly 2 hours.   COMPLICATIONS:  No complications.   DISPOSITION:  The patient went to PACU in good condition.   DESCRIPTION OF PROCEDURE:  The patient was identified in preop holding  area as Fausto Skillern with a date of birth of 10-08-1941.  The  right knee was marked for surgery by the patient, countersigned by the  surgeon.  History and physical update was completed.  The patient was  taken to the operating room for spinal anesthetic.  Antibiotics were  started.  A Foley catheter was inserted.  The right knee was prepped  with DuraPrep and draped sterilely.  The tourniquet was elevated to 350  mmHg after exsanguination of the limb with a 6-inch Esmarch.   The knee incision was made in flexion down to the extensor mechanism.  A  medial arthrotomy was  performed.  The patella was everted.  Medial soft  tissue sleeve was elevated.  Fat pad was resected.  The ACL, PCL and  medial and lateral menisci were removed  Two pins were placed in the femur and two in the tibia for the computer  markers.  The cutting block was set off the computer for a 0 degree  slope, 10 mm of bone to be resected from the good side.  This was  pinned, verified and the bone cut was made.  (Prior to the first cut, the femoral and tibial bony landmarks were  verified as required by the computer-assisted device.   For the distal femoral cut using the computer as well, we resected 9 mm  from the good side.  Upon verification and spacer block check, we took  an additional 4 mm of bone and were able to get the spacer block in  place.  Collateral ligaments seem stable in extension.  Rectangular  space was obtained.   With the assistance of the computer, we  made the four distal cuts.  The  box cut verified the cuts with the computer assistance, placed a 10 mm  spacer block in the flexion space, found that to be stable and with good  balance.  Alignment was good in flexion and extension.  We then put the  trials in place, ranged the knee several times, marked the tibia,  punched the tibia, drilled the femoral lug holes and then resected the  patella from a 20 down to a 10, drilled the three peg holes.   We then irrigated the knee, then punched the tibial base plate and then  we cemented the prostheses in place, removed excess cement, to a final  measurement off the computer, found our alignment to be within a degree  of 0 degrees.  Femoral head to tibial  plafond alignment was 0 to 0.5  degrees. Collateral ligaments were within a millimeter in extension and  flexion.   Final implant for the polyethylene was a tendon that gave [place] full  extension and 111 degrees of flexion.   We injected 30 mL of Marcaine with epinephrine into the soft tissues,  placed a pain pump  catheter in the subfascial plane, sewed the fascia  with #1 Bralon and then the subcu fat with 0- and 2-0 Monocryl placed  staples.  No drain.  The patient's wounds were dressed sterilely.  A  cryo cuff was applied, Ace wrap from toe to groin.  The patient went  recovery room in stable condition.      Vickki Hearing, M.D.     SEH/MEDQ  D:  05/13/2007  T:  05/13/2007  Job:  [id number]

## 2011-05-15 NOTE — Consult Note (Signed)
[name], [name] NO.:  [id number]   MEDICAL RECORD NO.:  [id number]          PATIENT TYPE:  INP   LOCATION:  A320                          FACILITY:  [facility]   PHYSICIAN:  Osvaldo Shipper, MD     DATE OF BIRTH:  1941/12/05   DATE OF CONSULTATION:  05/13/2007  DATE OF DISCHARGE:                                 CONSULTATION   REASON FOR CONSULTATION:  Management of myasthenia gravis, diabetes and  hypertension.   CHIEF COMPLAINT:  Right knee pain.   HISTORY OF PRESENT ILLNESS:  The patient is a 70 year old  African/American female who has a history of osteoarthritis, myasthenia  gravis, diabetes, hypertension, chronic lower extremity edema, who  underwent a right total knee replacement earlier today and we were  consulted to manage her medical issues.  The patient is currently  complaining of an 8/10 pain in her right knee, otherwise she does not  have any other medical complaint at this point.  She is slightly  somnolent because of the narcotic agents for analgesia.   MEDICATIONS AT HOME:  1. __________  16 mg t.i.d.  2. Glyburide/Metformin 2.5/500 mg, two b.i.d.  3. Aciphex 20 mg once q.d.  4. Methimazole 10 mg once q.d.  5. Effexor XR 150 mg, dosage not entirely clear.  6. Premarin 0.65 mg q.d.  7. Promethazine p.r.n.  8. Januvia 100 mg daily.  9. Prednisone 5 mg daily.  10.Caduet 5/80 mg, one tab daily.  11.Diltiazem ER 240 mg once q.d.  12.Avalide 300/25 mg once q.d.  13.Aspirin 81 mg daily, which has been discontinued.  14.Iron sulfate 325 mg b.i.d.  15.She is also on some eye drops Optivar and __________ , which she is      not getting at this point.  16.Lasix p.r.n., which she is also not getting at this point.   ALLERGIES:  No known drug allergies.   PAST MEDICAL/SURGICAL HISTORY:  1. Diabetes for 13 years.  2. Hypertension.  3. Hyperthyroidism for which she takes methimazole.  4. She has dyslipidemia.  5. Myasthenia gravis, for which  she was followed at [facility].      Now she is seeking a new neurologist at [facility].  6. Chronic pedal edema on and off.  No heart disease.  7. History of bronchitis is present.  8. She has had a cholecystectomy.  9. She has had a hysterectomy.  10.A tonsillectomy in the past.   SOCIAL HISTORY:  Lives alone in [place].  Is currently on disability.  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  She  is independent with her daily activities.  She does not require a cane  or a walker.  No smoking use.  No alcohol use.  No illicit drug use.   FAMILY HISTORY:  Diabetes, otherwise unremarkable.   REVIEW OF SYSTEMS:  GENERAL:  Positive for weakness.  HEENT:  Unremarkable.  RESPIRATORY:  Unremarkable.  CARDIOVASCULAR:  Unremarkable.  NEUROLOGIC:  Myasthenia gravis.  No current symptoms.  GI:  Unremarkable.  GENITOURINARY:  Unremarkable.  MUSCULOSKELETAL:  Complaining of pain in both the knees, more so on the right knee because  of surgery.  ENDOCRINE:  Unremarkable.  LYMPHATICS:  Unremarkable.  All  other systems unremarkable.   PHYSICAL EXAMINATION:  VITAL SIGNS:  Post-surgery vital signs reveal a  temperature of 96.9 degrees, heart rate 60, respirations 18, blood  pressure 119/67, saturation 95% on 2 liters by nasal cannula.  GENERAL:  An obese female somnolent, but in no distress.  HEENT:  No pallor, no icterus.  Oral mucous membranes moist.  No lesions  are noted.  NECK:  Soft, supple.  No thyromegaly is appreciated.  LUNGS:  Clear to auscultation bilaterally.  CARDIOVASCULAR:  S1 and S2 normal.  Regular rate and rhythm with no  murmurs appreciated.  ABDOMEN:  Soft, nontender, non-distended, obese.  No mass or  organomegaly is appreciated.  EXTREMITIES:  Covered in SCDs and dressings and is not fully examined.   LABORATORY DATA:  No labs are available today.   IMAGING STUDIES:  She had a right knee film today which showed  a  prosthesis in expected position, otherwise nothing else was remarkable.  Some pedal abnormality was noted.   ASSESSMENT/PLAN:  This is a 70 year old African/American female with  medical problems as stated above, who has undergone a right total knee  replacement, who is postoperative and who will be managed for her  current medical issues.  1. Myasthenia gravis:  She appears to be quite stable from this      respect.  Her __________  needs to be continued.  She is apparently      on the prednisone for this.  We will increase the dosage to provide      for some stress coverage.  Otherwise we will continue to monitor      her closely.  Her vital capacity may have to be monitored, but at      this point she appears to be quite stable.  2. Diabetes:  Will check CBGs q.a.c. and q.h.s. and put her on a      sliding scale.  Continue Glyburide and Metformin, though she is      getting a lower dose now.  Will continue to follow her and increase      this dose tomorrow.  With an increase in prednisone,  her CBGs may      increase.  Januvia will also be continued.  3. Hypertension:  Continue Diltiazem, Avalide and monitor this patient      closely.  4. Dyslipidemia:  Continue Caduet.  5. History of hyperthyroidism:  Continue methimazole at the current      dosage.  No reason to check a thyroid function at this point.   She mentions that she is on Premarin for hot flashes.  I told the  patient that long-term use of this medication is not advisable because  of multiple complications associated with it.   Deep venous thrombosis and GI prophylaxis also has been initiated.  PT  per Dr. Vickki Hearing.  Surgical management per Dr. Romeo Apple.  We  will continue to follow the patient while she is in the hospital.   We would like to thank Dr.  Romeo Apple for allowing [place] to see this  patient.  Please note that the above is preliminary until signed.      Osvaldo Shipper, MD    GK/MEDQ  D:   05/13/2007  T:  05/13/2007  Job:  [id number]   cc:  Vickki Hearing, M.D.  Fax: [phone]   Milus Mallick. Lodema Hong, M.D.  Fax: [phone]

## 2011-05-15 NOTE — Op Note (Signed)
[name], MECUM NO.:  [id number]   MEDICAL RECORD NO.:  [id number]          PATIENT TYPE:  AMB   LOCATION:  DAY                           FACILITY:  [facility]   PHYSICIAN:  Vickki Hearing, M.D.DATE OF BIRTH:  05-03-1941   DATE OF PROCEDURE:  02/01/2009  DATE OF DISCHARGE:                               OPERATIVE REPORT   PREOPERATIVE DIAGNOSES:  1. Carpal tunnel syndrome, right upper extremity.  2. Mass, right volar forearm.   POSTOPERATIVE DIAGNOSES:  1. Carpal tunnel syndrome, right upper extremity.  2. Mass, right volar forearm.   PROCEDURE:  Excision of mass and right carpal tunnel release.   SURGEON:  Vickki Hearing, MD   ASSISTANTS:  None.   ANESTHESIA:  Regional.   FINDINGS:  Carpal tunnel stenosis.  Mass consistent with lipoma right  forearm.  There was one specimen, which went to Pathology.  There was a  large 5 x 3 cm specimen consistent with fatty tissue.   ESTIMATED BLOOD LOSS:  No blood loss.   COMPLICATIONS:  No complications.   PACU:  The patient went to PACU in good condition and counts were  correct.   HISTORY:  [name] is a 70 year old.  She had symptoms of right carpal  tunnel syndrome.  She wore splints for several months, did not improve.  She also had a painful tender mass over the volar aspect of the forearm.  Negative x-rays for bony involvement.  She presented for release of the  carpal tunnel on the right and excision of the mass.  Informed consent  was obtained in the office.   The patient was identified and site marking was performed in the preop  area.  Antibiotics were started.  She was taken to surgery and bier  block was successful with no complications.  Right upper extremity was  prepped and draped sterilely.  The carpal tunnel was addressed first.   A straight incision was made over the carpal tunnel in line with the  ring finger.  The subcutaneous tissue and palmar fascia was divided  sharply.  Blunt  dissection was carried out beneath the transverse carpal  ligament and it was released.  Ligament was tied.  The carpal tunnel was  stenotic.  Nerve was compressed and discolored.   There were no space occupying lesions.  The wound was irrigated and  closed in interrupted fashion with 3-0 nylon suture.   Then turned our attention to the mass over the volar aspect of the mid  forearm.  A straight incision was made.  Subcutaneous tissue was  divided.  Blunt dissection was carried out and the mass was removed.  The mass was above the fascia layer of the volar muscles, was removed as  one continuous piece.  The wound was irrigated and closed with 3-0 nylon  staples.  Carpal tunnel incision and the forearm incision were injected  with Marcaine of 10 mL each and sterile dressing were applied to both  incisions.  Tourniquet was released.  There was good viability and  capillary refill to the hand, and the patient was taken  to recovery room  in stable condition.  Followup in 2 days.  She was discharged on Lorcet  Plus 1 every 4 p.r.n. for pain, #60 with a refill.      Vickki Hearing, M.D.  Electronically Signed     SEH/MEDQ  D:  02/01/2009  T:  02/01/2009  Job:  [id number]

## 2011-05-15 NOTE — Letter (Signed)
August 31, 2008    [name]. Lodema Hong, MD  [address], [place] [address]   RE:  [name], [name]  MRN:  [id number]  /  DOB:  [birth year]   Dear [name]:   It was my pleasure evaluating [name] in the office today in  consultation at your request for edema and mitral valve disease.  As you  know, this nice woman was previously seen by Dr. Dorethea Clan on occasion,  most recently in 2007 for chest discomfort.  A stress nuclear study in  2005 had been negative.  He did not believe that additional testing was  necessary at that time.  She has multiple cardiovascular risk factors,  most notably hypertension, hyperlipidemia, and diabetes.  She used  tobacco products in the past, but not recently.  She has additional  problems including DJD, myasthenia gravis, depression, and obesity with  a history of sleep apnea.  She has fairly recent laboratory studies that  show anemia with iron deficiency.   She recently started on prednisone and subsequently developed a 30-pound  weight gain.  She came to your office with marked peripheral edema,  malaise, and exertional dyspnea.  She previously was treated with low-  dose of thiazide diuretic.  She was changed to furosemide at a total of  60 mg per day plus metolazone 2.5 mg daily.  She diuresed back to her  baseline weight.  She is currently taking only 40 mg of furosemide per  day.  She now feels as if she is back to normal.   Current medications are extensive.   Cardiac drugs include,  1. Diltiazem 240 mg daily.  2. Avalide 300/25 mg daily.  3. Furosemide 40 mg daily.  4. KCl 20 mEq t.i.d.  5. Caduet 10/80 mg daily.  6. Metolazone 2.5 mg daily.   Past medical, social, and family history as well as review of systems  were updated with notable changes appended to our chart.   PHYSICAL EXAMINATION:  GENERAL:  On exam, pleasant, overweight woman in  no acute distress.  VITAL SIGNS:  The weight is 259, 11 pounds less than in  2007.  Blood  pressure 125/60, heart rate 60 and regular, respirations 16.  NECK:  Very mild jugular venous distention with some increase during  hepatic pressure.  Normal carotid upstrokes without bruits.  HEENT:  Anicteric sclerae.  LUNGS:  Clear.  CARDIAC:  Normal first and second heart sounds; grade 2/6 systolic  ejection murmur at the left sternal border; no apical murmur  appreciated.  No left ventricular lift.  PMI not appreciated.  ABDOMEN:  Soft and nontender; no organomegaly; normal bowel sounds  without bruits.  EXTREMITIES:  1/2+ ankle edema; distal pulses intact.  NEUROLOGIC:  Symmetric strength and tone; normal cranial nerves.   EKG:  Normal normal sinus rhythm; within normal limits.  No change when  compared to May 09, 2006.   IMPRESSION:  [name] developed a very impressive peripheral edema  temporally related to a tapering course of prednisone.  She was also  taking nonsteroidal anti-inflammatory agents at that time, which have  subsequently been discontinued.  She is currently on a three diuretic  regimen including the thiazide component of her Avalide, furosemide, and  metolazone.  You may wish to discontinue the latter drug and adjust her  Lasix dose if necessary to maintain a good fluid status.  We will obtain  a chest x-ray, TSH level, and BNP level.  I reviewed  her echocardiogram.  Her mitral regurgitation was no worse than moderate.  With normal left  ventricular size and no apparent structural mitral valve disease, I  doubt that this is a hemodynamically significant lesion or that it  accounts for her fluid retention.  She will continue to weight result at  home and report any gains of 5 pounds.  A chemistry profile and CBC will  also be repeated.  If lab results are good, I will plan to see this nice  woman again in 6 months.  Thank so much for referring her back to [place].    Sincerely,     Gerrit Friends. Dietrich Pates, MD, [facility]  Electronically Signed    RMR/MedQ  DD: 08/31/2008  DT: 08/31/2008  Job #: [id number]

## 2011-05-18 ENCOUNTER — Other Ambulatory Visit: Payer: Self-pay | Admitting: Family Medicine

## 2011-05-18 NOTE — Op Note (Signed)
NAME:  [name], LINEMAN                        ACCOUNT NO.:  [id number]   MEDICAL RECORD NO.:  [id number]                   PATIENT TYPE:  AMB   LOCATION:  DAY                                  FACILITY:  [facility]   PHYSICIAN:  R. Roetta Sessions, M.D.              DATE OF BIRTH:  09-Mar-1941   DATE OF PROCEDURE:  08/30/2004  DATE OF DISCHARGE:                                 OPERATIVE REPORT   PROCEDURE:  Screening colonoscopy.   ENDOSCOPIST:  Gerrit Friends. Rourk, M.D.   INDICATIONS FOR PROCEDURE:  The patient is a 70 year old-year-old lady sent  over at the courtesy of Dr. Syliva Overman for colorectal cancer  screening.  She is devoid of any lower GI tract symptoms.  No family history  of colorectal neoplasia.  She has never had a colonoscopy previously.  Colonoscopy is now being done as a screening maneuver.  This approach has  been discussed with the patient along with the potential risks, benefits,  and alternatives.  Her questions were answered.  She is agreeable.  Please  see the documentation in the medical record.   PROCEDURE NOTE:  O2 saturation, blood pressure, pulse and respirations were  monitored throughout the entire procedure.  Conscious sedation: Versed 4 mg IV, Demerol 75 mg IV in divided doses.   INSTRUMENT:  Olympus video chip system.   FINDINGS:  Digital rectal exam revealed no abnormalities.   ENDOSCOPIC FINDINGS:  The prep was good.   RECTUM:  Examination of the rectal mucosa including the retroflex view of  the anal verge revealed no abnormalities.   COLON:  The colonic mucosa was surveyed from the rectosigmoid junction  through the left transverse and right colon to the area of the appendiceal  orifice, ileocecal valve, and cecum.  These structures were well seen and  photographed for the record.  From this level the scope was slowly  withdrawn.  All previously mentioned mucosal surfaces were again seen.  The  colonic mucosa appeared normal aside from  pancolonic diverticula.  The  patient tolerated the procedure well and was reacted in endoscopy.   IMPRESSION:  1.  Normal rectum.  2.  Pancolonic diverticula.  3.  The remainder of the colonic mucosa appeared normal.   RECOMMENDATIONS:  1.  Diverticulosis literature provided to Ms. Donzetta Matters.  2.  Repeat colonoscopy in 10 years.      ___________________________________________                                            Jonathon Bellows, M.D.   RMR/MEDQ  D:  08/30/2004  T:  08/30/2004  Job:  [id number]   cc:   Milus Mallick. Lodema Hong, M.D.  [address]  [place], [place] [address]  Fax: [phone]   R. Roetta Sessions, M.D.  [address]    [place] [address]  Fax: [phone]

## 2011-05-18 NOTE — Op Note (Signed)
[name], [name]              ACCOUNT NO.:  [id number]   MEDICAL RECORD NO.:  [id number]          PATIENT TYPE:  AMB   LOCATION:  DAY                           FACILITY:  [facility]   PHYSICIAN:  R. Roetta Sessions, M.D. DATE OF BIRTH:  04/02/1941   DATE OF PROCEDURE:  10/31/2006  DATE OF DISCHARGE:                                 OPERATIVE REPORT   PROCEDURE:  EGD with Elease Hashimoto dilation.   INDICATIONS FOR PROCEDURE:  The patient is a 70 year old lady with  myasthenia gravis with progressive esophageal dysphagia over a period of  years.  She feels that food just sits on her stomach for a prolonged periods  of time. She is on Protonix 40 mg a day.  EGD is now being done for  potential of esophageal dilation reviewed.  Please see documentation in  medical record.   PROCEDURE NOTE:  O2 saturation, blood pressure and pulse monitored  throughout the entire procedure.  She was sedated with Versed 3 mg IV and  Demerol 75 mg IV in divided doses.  Instrument used was Olympus video chip  system.   FINDINGS:  Examination of tubular esophagus revealed, what appeared to be, a  non-critical Schatzki's ring.  It was very much there, but the lumen  appeared to be widely patent at that level.  Esophageal mucosa otherwise  appeared normal. EG junction easily traversed. The stomach, colon and  gastric cavity was emptied and insufflated well with air. Thorough  examination of the gastric mucosa with retroflexion of the proximal stomach  and esophagogastric junction demonstrated only a small hiatal hernia.  Pylorus was patent and easily traversed.  The duodenal bulb and the second  portion revealed no abnormalities.  Therapeutic/diagnostic maneuver was  performed.  The scope was removed, the pediatric Jerene Dilling dilator was  passed to its full insertion, with ease and looking back revealed a ring  very much remained intact.  Subsequently, two 1/4 bites with the biopsy  forceps were used to disrupt the  ring; this was done without difficulty or  apparent complication.  The patient tolerated the procedure well and was  reactive in the operating room.   IMPRESSION:  Schatzki's ring, status post dilation and disruption; described  above.  Otherwise normal esophagus.  Small hiatal hernia, otherwise normal  stomach, D1, and D2.   RECOMMENDATIONS:  Continue Aciphex 12 mg early daily.  Follow-up appointment  with [place] in 6 weeks to see how she is doing. Further recommendations to  follow.      Jonathon Bellows, M.D.  Electronically Signed     RMR/MEDQ  D:  10/31/2006  T:  10/31/2006  Job:  [id number]   cc:   Milus Mallick. Lodema Hong, M.D.  Fax: [phone]

## 2011-05-18 NOTE — Consult Note (Signed)
NAMESHIRLYN, SAVIN              ACCOUNT NO.:  [id number]   MEDICAL RECORD NO.:  [id number]          PATIENT TYPE:  AMB   LOCATION:  DAY                           FACILITY:  [facility]   PHYSICIAN:  R. Roetta Sessions, M.D. DATE OF BIRTH:  [birth year]   DATE OF CONSULTATION:  DATE OF DISCHARGE:                                   CONSULTATION   REASON FOR CONSULTATION:  Dysphagia.   HISTORY OF PRESENT ILLNESS:  [name] is a 70 year old African American  female with a history of myasthenia gravis.  She tells me she has had  difficulty swallowing her food with almost every meal.  She feels as though  her food gets stuck in her lower esophagus.  Hours later, she can have  regurgitation of undigested food.  She also has a history of chronic GERD  and has been taking AcipHex 20 mg daily which seems to control her heartburn  and indigestion at this time.  She complains of anorexia and early satiety,  as well.  She denies any abdominal pain.  She has chronic constipation.  She  generally has a bowel movement every two days, however, denies any rectal  bleeding or melena.   PAST MEDICAL HISTORY:  Myasthenia gravis, hypertension, diabetes mellitus,  arthritis, seasonal allergies.  Colonoscopy in 2005 by Dr. Jena Gauss which  showed pan-colonic diverticula.  Sleep apnea, she uses CPAP at night.  Cholecystectomy in 1967 for cholelithiasis.  Tonsillectomy.  Fatty tumor of  the left arm.  Three hand surgeries.   CURRENT MEDICATIONS:  Glyburide/Metformin 2.5/500 mg, 2 p.o. b.i.d.,  ibuprofen 800 mg b.i.d., calcium and vitamin D daily, cranberry once daily,  Diltiazem 240 mg b.i.d., Avalide 300/25 mg daily, aspirin 81 mg daily,  methimazole 10 mg daily, Premarin 0.625 mg daily, Effexor XR 150 mg daily,  AcipHex 20 mg daily, prednisone 5 mg daily, Lorazepam 0.5 mg p.r.n., gentle  iron 28  mg daily, Pyridostigmine BR 60 mg t.i.d., Caduet 5/80 mg daily,  Betimol 1 drop b.i.d.   ALLERGIES:  No known drug  allergies.   FAMILY HISTORY:  There is no family history of colorectal carcinoma, liver,  cardiac, or GI problems.  Mother is deceased secondary to ENT cancer at age  27.  Father deceased in his 73s secondary to old age.  She had five siblings  with history significant for hypertension, diabetes mellitus.   SOCIAL HISTORY:  [name] is single, she has two grown healthy children.  She is disabled.  She has a 45-pack-year history of tobacco use quitting two  years ago.  She denies alcohol or drug use.   REVIEW OF SYSTEMS:  Weight is still increasing.  Denies fevers or chills.  Cardiovascular:  Denies congestion or palpitations.  Pulmonary:  No  shortness of breath, dyspnea, cough, hemoptysis.  GI:  See HPI.   PHYSICAL EXAMINATION:  VITAL SIGNS:  Weight 264 pounds, height 66 inches, temperature 98.2, blood  pressure 150/80, pulse 72.  GENERAL:  [name] is a 70 year old African American female who is alert  and oriented, pleasant and cooperative, in no acute distress.  HEENT:  Sclerae were clear, conjunctivae pink, oropharynx pink and moist  without lesions.  She does have exophthalmus.  NECK:  Supple, no thyromegaly or masses.  CARDIOVASCULAR:  Regular rate and rhythm, normal S1 and S2.  No cyanosis,  clubbing, and edema.  LUNGS:  Clear to auscultation bilaterally.  ABDOMEN:  Protuberant with positive bowel sounds x4, no bruits auscultated.  She does have mild left upper quadrant tenderness on deep palpation.  There  is no rebound tenderness or guarding.  No hepatosplenomegaly or mass.  Exam  is limited given the patient's body habitus.  EXTREMITIES:  Right lower extremity with 1+ edema, left lower extremity  trace edema.  SKIN:  Warm and dry without rash or jaundice.   IMPRESSION:  [name] is a 70 year old African American female with  progressive dysphagia for several years now.  She is having symptoms with  almost every meal.  She feels as though her food gets stuck  in her lower  esophagus.  She can also have regurgitation hours post prandial.  She also  complains of anorexia and early satiety.  She has a history of myasthenia  gravis which could be contributing to her symptoms.  However, she is going  to need complete exam with EGD to determine whether there is a structural  component of her symptoms like web, ring, or stricture.   PLAN:  1. EGD with Dr. Jena Gauss in the near future.  I discussed the procedure      including the risks and benefits including but not limited to bleeding,      infection, perforation, and consent is obtained.  2. She can continue AcipHex 20 mg daily for now.      Nicholas Lose, N.P.      Jonathon Bellows, M.D.  Electronically Signed    KC/MEDQ  D:  10/22/2006  T:  10/22/2006  Job:  [id number]

## 2011-05-18 NOTE — H&P (Signed)
[name], [name] NO.:  [id number]   MEDICAL RECORD NO.:  [id number]          PATIENT TYPE:  AMB   LOCATION:  DAY                           FACILITY:  [facility]   PHYSICIAN:  Vickki Hearing, M.D.DATE OF BIRTH:  [birth year]   DATE OF ADMISSION:  DATE OF DISCHARGE:  LH                              HISTORY & PHYSICAL   CHIEF COMPLAINT:  Right knee pain.   HISTORY:  This is a 70 year old female who has been seen by me since  January of 2006 for multiple orthopedic problems.  She is being admitted  for right total knee replacement.   She has a history of diabetes and hypertension.  She has had a  hysterectomy and a cholecystectomy.   CURRENT MEDICATIONS:  1. Pyridostigmine 60 mg one three times a day.  2. Glyburide/metformin combination 2.5/500 mg, two b.i.d.  3. Aciphex 20 mg a day.  4. Methimazole 10 mg a day.  5. Effexor XR 150 mg, two a day.  6. Premarin 0.65 mg daily.  7. Promethazine 25 mg q.6h. p.r.n. nausea.  8. Januvia 100 mg a day.  9. Prednisone 5 mg a day.  10.Caduet 5/80 mg a day.  11.Diltiazem HCl ER 240 mg twice a day.  12.Avalide 300/25 mg daily.  13.Aspirin 81 mg a day, stopped.  14.Iron 325 mg one b.i.d.  15.Autodisk control for eye drops and Optivar 4.05% I solution drops.   FAMILY HISTORY:  She has a family history of heart disease, cancer, and  diabetes.  Her family physician is Dr. Lodema Hong.  She will therefore be  followed by the hospitalist.   SOCIAL HISTORY:  She is single and disabled.  She does not smoke or  drink alcohol.  Caffeine use one a day.   She complains of pain in the right knee, trouble getting up and down out  of a chair, climbing the stairs.  Pain is located over the medial side  of the joint and says she feels like something is moving inside the  joint.  Her range of motion is 0 to 115 degrees.  Her ligaments are  stable.  Her muscle strength and muscle tone is normal, including the  upper extremities.   Overall alignment is shaded towards varus, and the  medial joint line is tender.   She has failed injection therapy, anti-inflammatories, Ultracet.   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   PHYSICAL EXAMINATION:  GENERAL:  Further examination reveals that she is  well-developed and nourished, grooming and hygiene normal.  Peripheral pulses are intact with no swelling.  NECK:  Lymph nodes are benign.  SKIN:  Skin integrity is normal.  PSYCHOLOGIC:  Psychological tests are normal.  NEUROLOGICAL:  Neurological tests are normal, including evaluation of  the lower extremities related to her lumbar disease.  EXTREMITIES:  Her upper extremities show normal range of motion,  strength, stability, and alignment.   IMPRESSION:  Osteoarthritis of the right knee.  PLAN:  Right total knee with computer assistance.      Vickki Hearing, M.D.  Electronically Signed     SEH/MEDQ  D:  05/12/2007  T:  05/12/2007  Job:  [id number]

## 2011-05-18 NOTE — Procedures (Signed)
NAMESHAVANNA, FURNARI              ACCOUNT NO.:  [id number]   MEDICAL RECORD NO.:  [id number]          PATIENT TYPE:  AMB   LOCATION:                                FACILITY:  [facility]   PHYSICIAN:  Ray Bing, M.D.  DATE OF BIRTH:  [birth year]   DATE OF PROCEDURE:  11/09/2004  DATE OF DISCHARGE:                                  ECHOCARDIOGRAM   REFERRING PHYSICIAN:  1.  Dr. Lodema Hong.  2.  Dr. Dorethea Clan.   CLINICAL DATA:  A 70 year old woman with chest pain and dyspnea.   M-MODE:  Aorta 2.7, left atrium 4.7, septum 1.8, posterior wall 1.3, LV  diastole 4.3, LV systole 2.3.   RESULTS:  1.  Technically adequate echocardiographic study.  2.  Mild left atrial enlargement;  normal right atrium and right ventricle.  3.  Minimal AV sclerosis;  mild aortic root calcification;  trivial      regurgitation.  4.  Very mild mitral valve thickening;  very mild regurgitation.  5.  Normal tricuspid and pulmonic valves.  6.  Normal internal dimension of the left ventricle;  mild to moderate      hypertrophy with disproportionate involvement of the septum.  Normal      left ventricular systolic function.  7.  Normal inferior vena cava.  8.  Comparison with March 19, 2002:  No significant change.     [place]obe   RR/MEDQ  D:  11/09/2004  T:  11/09/2004  Job:  [id number]

## 2011-05-29 ENCOUNTER — Encounter: Payer: Self-pay | Admitting: Family Medicine

## 2011-05-29 ENCOUNTER — Other Ambulatory Visit: Payer: Self-pay

## 2011-05-30 ENCOUNTER — Ambulatory Visit (INDEPENDENT_AMBULATORY_CARE_PROVIDER_SITE_OTHER): Payer: Medicare Other | Admitting: Family Medicine

## 2011-05-30 ENCOUNTER — Ambulatory Visit (INDEPENDENT_AMBULATORY_CARE_PROVIDER_SITE_OTHER): Payer: Medicare Other | Admitting: Orthopedic Surgery

## 2011-05-30 ENCOUNTER — Encounter: Payer: Self-pay | Admitting: Orthopedic Surgery

## 2011-05-30 ENCOUNTER — Encounter: Payer: Self-pay | Admitting: Family Medicine

## 2011-05-30 VITALS — BP 160/78 | HR 82 | Resp 16 | Ht 66.0 in | Wt 275.8 lb

## 2011-05-30 DIAGNOSIS — F3289 Other specified depressive episodes: Secondary | ICD-10-CM

## 2011-05-30 DIAGNOSIS — F329 Major depressive disorder, single episode, unspecified: Secondary | ICD-10-CM

## 2011-05-30 DIAGNOSIS — M1710 Unilateral primary osteoarthritis, unspecified knee: Secondary | ICD-10-CM

## 2011-05-30 DIAGNOSIS — E119 Type 2 diabetes mellitus without complications: Secondary | ICD-10-CM

## 2011-05-30 DIAGNOSIS — IMO0002 Reserved for concepts with insufficient information to code with codable children: Secondary | ICD-10-CM

## 2011-05-30 DIAGNOSIS — I10 Essential (primary) hypertension: Secondary | ICD-10-CM

## 2011-05-30 DIAGNOSIS — N189 Chronic kidney disease, unspecified: Secondary | ICD-10-CM

## 2011-05-30 MED ORDER — DILTIAZEM HCL ER COATED BEADS 240 MG PO CP24: ORAL_CAPSULE | ORAL | Status: DC

## 2011-05-30 MED ORDER — BENAZEPRIL HCL 20 MG PO TABS: 20.0000 mg | ORAL_TABLET | Freq: Every day | ORAL | Status: DC

## 2011-05-30 MED ORDER — LOSARTAN POTASSIUM 100 MG PO TABS: 100.0000 mg | ORAL_TABLET | Freq: Every day | ORAL | Status: DC

## 2011-05-30 NOTE — Patient Instructions (Addendum)
F/U in  5 weeks.   We will request adult diapers.    .  New med for blood pressure, continue diltiazem twice daily.  HBA1C  And chem 7 in 5 weeks.   Pneumovac next visit

## 2011-05-30 NOTE — Progress Notes (Signed)
Injection LEFT knee.  Consent was obtained.  Time out was taken   LEFT knee was injected with Depo-Medrol 40 mg plus lidocaine 1% 4 cc.  Knee was prepped with alcohol and anesthetized with ethyl chloride.  The injection was tolerated without complication.   

## 2011-05-30 NOTE — Progress Notes (Signed)
  Subjective:    Patient ID: [name], female    DOB: [birth year], 70 y.o.   MRN: [id number]  HPI Pt reports worsening left knee pain and instability, thinks she decide on knee surgery for the winter and has questions about surgery in [facility] HYPERTENSION Disease Monitoring Blood pressure range-unknown Chest pain- no      Dyspnea- no Medications Compliance- good Lightheadedness- no   Edema- no   DIABETES Disease Monitoring Blood Sugar ranges-120 to 150 fastingPolyuria- no New Visual problems- no Medications Compliance- fait Hypoglycemic symptoms- no   HYPERLIPIDEMIA Disease Monitoring See symptoms for Hypertension Medications Compliance- good RUQ pain- no  Muscle aches- no       Review of Systems Denies recent fever or chills. Denies sinus pressure, nasal congestion, ear pain or sore throat. Denies chest congestion, productive cough or wheezing. Denies chest pains, palpitations, paroxysmal nocturnal dyspnea, orthopnea and leg swelling Denies abdominal pain, nausea, vomiting,diarrhea or constipation.  Denies rectal bleeding or change in bowel movement. Denies dysuria, frequency, hesitancy or incontinence.  Denies headaches, seizure, numbness, or tingling. Denies uncontrolled depression, anxiety or insomnia. Denies skin break down or rash.        Objective:   Physical Exam Patient alert and oriented and in no Cardiopulmonary distress.  HEENT: No facial asymmetry, EOMI, no sinus tenderness, TM's clear, Oropharynx pink and moist.  Neck supple no adenopathy.  Chest: Clear to auscultation bilaterally.  CVS: S1, S2 no murmurs, no S3.  ABD: Soft non tender. Bowel sounds normal.  Ext: No edema  MS: decreased  ROM spine, shoulders, hips and knees.  Skin: Intact, no ulcerations or rash noted.  Psych: Good eye contact, normal affect. Memory intact not anxious or depressed appearing.  CNS: CN 2-12 intact, power and  Tone normal   Throughout. Diabetic  Foot Check:  Appearance - no lesions, ulcers or calluses Skin - no unusual pallor or redness Sensation - grossly intact to light touch Monofilament testing -  Right - Great toe, medial, central, lateral ball and posterior foot diminished Left - Great toe, medial, central, lateral ball and posterior foot diminished Pulses Left - Dorsalis Pedis and Posterior Tibia normal Right - Dorsalis Pedis and Posterior Tibia normal         Assessment & Plan:

## 2011-05-30 NOTE — Assessment & Plan Note (Signed)
Medication compliance addressed. Commitment to regular exercise and healthy  food choices, with portion control discussed. DASH diet and low fat diet discussed and literature offered. Changes in medication made at this visit.  

## 2011-05-30 NOTE — Progress Notes (Signed)
Separate x-ray report.  LEFT knee x-ray.  3 views.  Compared to previous views in 2010.  There is increased joint space narrowing in the medial compartment or marginal osteophytes. There is mild to moderate varus deformity.  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

## 2011-05-30 NOTE — Progress Notes (Signed)
Today's chief complaint is pain, LEFT knee.  The patient has noted increasing pain in the LEFT knee over the last month and a half with increasing medial pain associated with lateral leg pain radiating from the proximal thigh and lower leg. She notices swelling in her LEFT knee as well. She is status post RIGHT knee arthroplasty. 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  No family history on file. Past Medical History  Diagnosis Date  . Diabetes mellitus   . Hypertension   . Tobacco abuse   . Myasthenia gravis   . Arthritis   . Gastroesophageal reflux   . Thyroid disease   . Sleep apnea   . Iron deficiency anemia   . Depression   . Hyperlipidemia   . Obesity   . Carpal tunnel syndrome   . Shoulder pain   . Impingement syndrome, shoulder   . Pulmonary HTN   . Mitral regurgitation   . Schatzki's ring     Last EGD w/ dilation 02/08/11, 2009 & 2007  . Esophagitis, erosive 2009  . Diverticulosis 07/2004    Colonscopy Dr Jena Gauss   Past Surgical History  Procedure Date  . Rt tka 05/13/07    Dr. Romeo Apple  . Right carpal tunnel release 02/01/2009  . Incontinence surgery 08/26/09    Tananbaum  . Cholecystectomy   . Abdominal hysterectomy   . Eye surgery    Review of systems no other findings today.  Examination reveals a mild to moderately obese black female normal development growing and hygiene.  Global tenderness in the LEFT knee, tenderness over the medial bursa. Tenderness over the medial and lateral joint line with painful patellofemoral crepitance. Knee flexion. Range of motion 105.  X-rays show progressive arthritis medially with severe patellofemoral arthritis.  Diagnosis #1 progressive osteoarthritis, LEFT knee. Diagnosis #2 radicular pain, LEFT leg.  LEFT knee. injection was given.  I have advised that she have knee replacement surgery since she is already on hydrocodone 10 mg q.8 hours for  pain. I don't see going to a oxycodone in this setting. She says she wants to get back to [place] on the actual date. We'll have her come in for preop at that time

## 2011-06-10 NOTE — Assessment & Plan Note (Signed)
Worsening, holding off on surgery at this time, unsure where she wants procedure , and by whom

## 2011-06-10 NOTE — Assessment & Plan Note (Signed)
Uncontrolled, reports improvement in blood sugars, HBA1C due prior to next visit

## 2011-06-10 NOTE — Assessment & Plan Note (Signed)
Improved, benefited from therapy , but unable to afford

## 2011-06-10 NOTE — Assessment & Plan Note (Signed)
Followed by nephrology. 

## 2011-06-12 ENCOUNTER — Other Ambulatory Visit: Payer: Self-pay | Admitting: Family Medicine

## 2011-06-14 ENCOUNTER — Other Ambulatory Visit: Payer: Self-pay | Admitting: Family Medicine

## 2011-06-14 ENCOUNTER — Other Ambulatory Visit: Payer: Self-pay | Admitting: *Deleted

## 2011-06-14 DIAGNOSIS — M79606 Pain in leg, unspecified: Secondary | ICD-10-CM

## 2011-06-14 MED ORDER — GABAPENTIN 300 MG PO CAPS: 300.0000 mg | ORAL_CAPSULE | Freq: Three times a day (TID) | ORAL | Status: DC

## 2011-06-29 ENCOUNTER — Other Ambulatory Visit: Payer: Self-pay | Admitting: Family Medicine

## 2011-07-16 ENCOUNTER — Other Ambulatory Visit: Payer: Self-pay | Admitting: Family Medicine

## 2011-07-17 ENCOUNTER — Other Ambulatory Visit: Payer: Self-pay | Admitting: Family Medicine

## 2011-07-20 NOTE — Progress Notes (Signed)
Premarin removed fro med list

## 2011-07-25 ENCOUNTER — Ambulatory Visit (INDEPENDENT_AMBULATORY_CARE_PROVIDER_SITE_OTHER): Payer: Medicare Other | Admitting: Family Medicine

## 2011-07-25 ENCOUNTER — Encounter: Payer: Self-pay | Admitting: Family Medicine

## 2011-07-25 VITALS — BP 130/78 | HR 84 | Resp 16 | Ht 66.0 in | Wt 273.1 lb

## 2011-07-25 DIAGNOSIS — E119 Type 2 diabetes mellitus without complications: Secondary | ICD-10-CM

## 2011-07-25 DIAGNOSIS — E785 Hyperlipidemia, unspecified: Secondary | ICD-10-CM

## 2011-07-25 DIAGNOSIS — D638 Anemia in other chronic diseases classified elsewhere: Secondary | ICD-10-CM

## 2011-07-25 DIAGNOSIS — D649 Anemia, unspecified: Secondary | ICD-10-CM

## 2011-07-25 DIAGNOSIS — I10 Essential (primary) hypertension: Secondary | ICD-10-CM

## 2011-07-25 DIAGNOSIS — E039 Hypothyroidism, unspecified: Secondary | ICD-10-CM

## 2011-07-25 DIAGNOSIS — N390 Urinary tract infection, site not specified: Secondary | ICD-10-CM

## 2011-07-25 DIAGNOSIS — N3090 Cystitis, unspecified without hematuria: Secondary | ICD-10-CM | POA: Insufficient documentation

## 2011-07-25 LAB — POCT URINALYSIS DIPSTICK
Spec Grav, UA: 1.025
Urobilinogen, UA: 0.2

## 2011-07-25 MED ORDER — CIPROFLOXACIN HCL 500 MG PO TABS: 500.0000 mg | ORAL_TABLET | Freq: Two times a day (BID) | ORAL | Status: AC

## 2011-07-25 MED ORDER — CEFTRIAXONE SODIUM 500 MG IJ SOLR
500.0000 mg | Freq: Once | INTRAMUSCULAR | Status: AC
  Administered 2011-07-25: 500 mg via INTRAMUSCULAR

## 2011-07-25 NOTE — Progress Notes (Signed)
  Subjective:    Patient ID: [name], female    DOB: 08/31/1941, 70 y.o.   MRN: [id number]  HPI 1 week h/o intermittent chills, right flank pain and frequency with dysuria also malodorous urine.increased fatigue and uncontrolled blood sugars which fluctuate from 87 in the mornings to over 300 during the day. Poor eating habits continue to be a big problem for her . HYPERTENSION  Disease Monitoring  Blood pressure range-unknown Chest pain- no      Dyspnea- no  Compliance- good Lightheadedness- no   Edema- no  DIABETES  Disease Monitoring  Blood Sugar ranges-vary from 80's top over 300 Polyuria- no New Visual problems- no  Compliance- poor Hypoglycemic symptoms- occasionally  Last eye exam: sees opthalmology every 3 to 6 months , has glaucoma, UTD      Podiatry visit: no  HYPERLIPIDEMIA  Compliance- good  RUQ pain- no  Muscle aches- no   REGULAR EXERCISE-no       Review of Systems See HPI . Denies sinus pressure, nasal congestion, ear pain or sore throat. Denies chest congestion, productive cough or wheezing. Denies chest pains, palpitations and leg swelling Denies abdominal pain, nausea, vomiting,diarrhea or constipation.   Chronic back pain with limitation in mobility. Denies headaches, seizures, numbness, or tingling. Continues to often feel  Depressed, but denies suicidal or homicidal ideation, and does not feel she needs to see a therapist at this time Denies skin break down or rash.        Objective:   Physical Exam Patient alert and oriented and in no cardiopulmonary distress.Ill appearing  HEENT: No facial asymmetry, EOMI, no sinus tenderness,  oropharynx pink and moist.  Neck supple no adenopathy.  Chest: Clear to auscultation bilaterally.  CVS: S1, S2 no murmurs, no S3.  ABD: Soft non tender no renal angle tenderness Bowel sounds normal.  Ext: No edema  MS: Adequate ROM spine, shoulders, hips and knees.  Skin: Intact, no ulcerations  or rash noted.  Psych: Good eye contact, normal affect. Memory intact   CNS: CN 2-12 intact, power, tone and sensation normal throughout.        Assessment & Plan:

## 2011-07-25 NOTE — Patient Instructions (Addendum)
F/U in 2 months.  CBCand diff and chem 7 and HBA1C today.  Microalb and nurse visit for pneumonia vaccine 8/11 or after, pt to call in a few days before.   You appear to have a UTI, Rocephin 500mg  im in the office and ciprofloxacin sent to your pharmacy.  You are being referred for individual counseling for diabetes and obesity, also pls call for grp sessions  Weight loss goal of 6 pounds

## 2011-07-26 LAB — CBC WITH DIFFERENTIAL/PLATELET
Basophils Absolute: 0 10*3/uL (ref 0.0–0.1)
Eosinophils Relative: 1 % (ref 0–5)
HCT: 37.7 % (ref 36.0–46.0)
Hemoglobin: 11.8 g/dL — ABNORMAL LOW (ref 12.0–15.0)
Lymphocytes Relative: 17 % (ref 12–46)
Lymphs Abs: 1.8 10*3/uL (ref 0.7–4.0)
MCV: 89.1 fL (ref 78.0–100.0)
Monocytes Absolute: 0.8 10*3/uL (ref 0.1–1.0)
Monocytes Relative: 8 % (ref 3–12)
RDW: 16.6 % — ABNORMAL HIGH (ref 11.5–15.5)
WBC: 10.6 10*3/uL — ABNORMAL HIGH (ref 4.0–10.5)

## 2011-07-26 LAB — BASIC METABOLIC PANEL
BUN: 13 mg/dL (ref 6–23)
CO2: 28 mEq/L (ref 19–32)
Chloride: 106 mEq/L (ref 96–112)
Creat: 0.9 mg/dL (ref 0.50–1.10)
Glucose, Bld: 258 mg/dL — ABNORMAL HIGH (ref 70–99)

## 2011-07-27 LAB — URINE CULTURE: Colony Count: 75000

## 2011-08-02 ENCOUNTER — Telehealth: Payer: Self-pay | Admitting: Family Medicine

## 2011-08-02 ENCOUNTER — Other Ambulatory Visit: Payer: Self-pay | Admitting: Family Medicine

## 2011-08-02 MED ORDER — INSULIN GLARGINE 100 UNIT/ML ~~LOC~~ SOLN: SUBCUTANEOUS | Status: DC

## 2011-08-02 NOTE — Telephone Encounter (Signed)
Will be seen at 10am in the morning, discussed case with dr Jeanice Lim   As far as her blood sugar management is concerned , she is advised to start lantus 7 units in the morning and continue the same evening dose.  Following her diet is crucial reports marked fluctuation

## 2011-08-02 NOTE — Telephone Encounter (Signed)
Patient was here last wednesday

## 2011-08-02 NOTE — Assessment & Plan Note (Signed)
Will treat presumptively based on symptoms and f/u on urine c/s

## 2011-08-02 NOTE — Assessment & Plan Note (Signed)
Followed by endocrine

## 2011-08-02 NOTE — Assessment & Plan Note (Signed)
Well controlled based on most recent labs need rept at next draw

## 2011-08-02 NOTE — Assessment & Plan Note (Signed)
Uncontrolled, due to poor eating eating habits, referred to class

## 2011-08-02 NOTE — Assessment & Plan Note (Signed)
Controlled, no change in medication  

## 2011-08-03 ENCOUNTER — Encounter: Payer: Self-pay | Admitting: Family Medicine

## 2011-08-03 ENCOUNTER — Other Ambulatory Visit: Payer: Self-pay | Admitting: Family Medicine

## 2011-08-03 ENCOUNTER — Ambulatory Visit (INDEPENDENT_AMBULATORY_CARE_PROVIDER_SITE_OTHER): Payer: Medicare Other | Admitting: Family Medicine

## 2011-08-03 DIAGNOSIS — N390 Urinary tract infection, site not specified: Secondary | ICD-10-CM

## 2011-08-03 DIAGNOSIS — R109 Unspecified abdominal pain: Secondary | ICD-10-CM

## 2011-08-03 DIAGNOSIS — N3090 Cystitis, unspecified without hematuria: Secondary | ICD-10-CM

## 2011-08-03 LAB — POCT URINALYSIS DIPSTICK
Blood, UA: NEGATIVE
Ketones, UA: NEGATIVE
Leukocytes, UA: NEGATIVE
Protein, UA: 100
pH, UA: 6

## 2011-08-03 MED ORDER — CEFTRIAXONE SODIUM 500 MG IJ SOLR
500.0000 mg | Freq: Once | INTRAMUSCULAR | Status: AC
  Administered 2011-08-03: 500 mg via INTRAMUSCULAR

## 2011-08-03 MED ORDER — CEPHALEXIN 250 MG PO CAPS: 500.0000 mg | ORAL_CAPSULE | Freq: Four times a day (QID) | ORAL | Status: AC

## 2011-08-03 NOTE — Progress Notes (Signed)
  Subjective:    Patient ID: [name], female    DOB: 1941/11/10, 70 y.o.   MRN: [id number]  HPI  Patient was seen on July 25 and diagnosed with urinary tract infection. She was given IM Rocephin during this as well as a prescription for ciprofloxacin which she has completed. Patient presents today as she has continued without odor to urine as well as flank pain and suprapubic pressure. She states initially she improved after giving the injection however a few days later symptoms returned. She denies any fever or or chills admits to diaphoresis and not feeling well overall. She is also noted that her blood sugars have been running high if it she's been sick the past 2 weeks. She has an appointment to see her urologist as the afternoon to discuss her bladder sling and the recall that she is seen on television. She currently denies any nausea or vomiting. Of note she does have chronic back pain but feels that her right flank pain feels different from her typical lower back pain.   Review of Systems - per above  GEN- +fatigue, +weakness, denies fever  GU- +incontinence, denies diarrhea, denies hematuria, denies blood in stools, denies N/V     Objective:   Physical Exam  GEN- NAD, alert and oriented, non toxic appearing  CVS-RRR, no murmur  RESP- CTAB  ABD- mild TTP over lower quadrants and suprapubic region, no rebound. Equivocal CVA tenderness on right side, no masses, no gaurding, NABS GU- no vaginal discharge, mild odor in vaginal region  Catherization- sterile urine cath obtained       Assessment & Plan:

## 2011-08-03 NOTE — Assessment & Plan Note (Signed)
Patient has a recurrence of initial symptoms. Urine dipstick did not show any leukocytes or nitrate however she had a culture with multiple bacterial types. Catheterization specimen obtained today. Will give her another dose of IM Rocephin and start her on cephalexin 4 times a day. Her abdominal exam did not show any evidence of renal retention. She was given red flax for infection. If her pain does not improve she will need further workup such as blood work and possible imaging of the abdomen.

## 2011-08-03 NOTE — Progress Notes (Signed)
Addended by: Abner Greenspan on: 08/03/2011 04:11 PM   Modules accepted: Orders

## 2011-08-03 NOTE — Patient Instructions (Addendum)
Start the new antibiotics tomorrow We will call you Monday about your urine sample If you have fever, vomiting, or severe pain go to the ER If you are still having abd pain or pressure next week give [place] a call

## 2011-08-06 ENCOUNTER — Telehealth: Payer: Self-pay | Admitting: Family Medicine

## 2011-08-06 NOTE — Telephone Encounter (Signed)
States she is feeling well, pain has improved, urologist office also did a culture. She needs to have a CT scan done to evaluate her bladder sling which urology is performing. No further recommendations, as she is improving will have her complete the course of antibiotics

## 2011-08-08 ENCOUNTER — Other Ambulatory Visit (HOSPITAL_COMMUNITY): Payer: Self-pay | Admitting: Urology

## 2011-08-08 DIAGNOSIS — N3000 Acute cystitis without hematuria: Secondary | ICD-10-CM

## 2011-08-13 ENCOUNTER — Other Ambulatory Visit (HOSPITAL_COMMUNITY): Payer: Medicare Other

## 2011-08-15 ENCOUNTER — Other Ambulatory Visit: Payer: Self-pay | Admitting: Family Medicine

## 2011-08-21 ENCOUNTER — Ambulatory Visit (HOSPITAL_COMMUNITY): Payer: Medicare Other

## 2011-09-17 ENCOUNTER — Other Ambulatory Visit: Payer: Self-pay | Admitting: Family Medicine

## 2011-09-18 ENCOUNTER — Ambulatory Visit (INDEPENDENT_AMBULATORY_CARE_PROVIDER_SITE_OTHER): Payer: Medicare Other | Admitting: *Deleted

## 2011-09-18 DIAGNOSIS — Z23 Encounter for immunization: Secondary | ICD-10-CM

## 2011-09-18 MED ORDER — INFLUENZA VAC TYPES A & B PF IM SUSP: 0.5000 mL | Freq: Once | INTRAMUSCULAR | Status: DC

## 2011-09-25 ENCOUNTER — Encounter: Payer: Self-pay | Admitting: Family Medicine

## 2011-09-26 ENCOUNTER — Ambulatory Visit: Payer: Medicare Other | Admitting: Family Medicine

## 2011-09-30 ENCOUNTER — Other Ambulatory Visit: Payer: Self-pay | Admitting: *Deleted

## 2011-09-30 MED ORDER — ROSUVASTATIN CALCIUM 20 MG PO TABS: 20.0000 mg | ORAL_TABLET | Freq: Every day | ORAL | Status: DC

## 2011-10-01 LAB — GLUCOSE, CAPILLARY
Glucose-Capillary: 54 — ABNORMAL LOW
Glucose-Capillary: 86

## 2011-10-10 ENCOUNTER — Other Ambulatory Visit: Payer: Self-pay | Admitting: Family Medicine

## 2011-10-11 ENCOUNTER — Telehealth: Payer: Self-pay | Admitting: Family Medicine

## 2011-10-12 ENCOUNTER — Other Ambulatory Visit: Payer: Self-pay | Admitting: Family Medicine

## 2011-10-15 ENCOUNTER — Telehealth: Payer: Self-pay | Admitting: Family Medicine

## 2011-10-16 ENCOUNTER — Other Ambulatory Visit: Payer: Self-pay | Admitting: Family Medicine

## 2011-10-16 ENCOUNTER — Other Ambulatory Visit: Payer: Self-pay | Admitting: *Deleted

## 2011-10-16 ENCOUNTER — Other Ambulatory Visit: Payer: Self-pay

## 2011-10-16 DIAGNOSIS — M79606 Pain in leg, unspecified: Secondary | ICD-10-CM

## 2011-10-16 MED ORDER — GABAPENTIN 300 MG PO CAPS: 300.0000 mg | ORAL_CAPSULE | Freq: Three times a day (TID) | ORAL | Status: DC

## 2011-10-16 MED ORDER — PYRIDOSTIGMINE BROMIDE 60 MG PO TABS: 60.0000 mg | ORAL_TABLET | Freq: Three times a day (TID) | ORAL | Status: DC

## 2011-10-18 NOTE — Telephone Encounter (Signed)
Already sent.

## 2011-10-26 NOTE — Telephone Encounter (Signed)
meds have already been sent in

## 2011-11-15 ENCOUNTER — Other Ambulatory Visit: Payer: Self-pay | Admitting: Family Medicine

## 2011-12-31 ENCOUNTER — Encounter: Payer: Self-pay | Admitting: Family Medicine

## 2012-01-01 DIAGNOSIS — J189 Pneumonia, unspecified organism: Secondary | ICD-10-CM

## 2012-01-01 HISTORY — DX: Pneumonia, unspecified organism: J18.9

## 2012-01-02 ENCOUNTER — Ambulatory Visit (INDEPENDENT_AMBULATORY_CARE_PROVIDER_SITE_OTHER): Payer: Medicare Other | Admitting: Family Medicine

## 2012-01-02 ENCOUNTER — Encounter: Payer: Self-pay | Admitting: Family Medicine

## 2012-01-02 VITALS — BP 130/72 | HR 67 | Resp 16 | Ht 66.0 in | Wt 270.8 lb

## 2012-01-02 DIAGNOSIS — F419 Anxiety disorder, unspecified: Secondary | ICD-10-CM | POA: Insufficient documentation

## 2012-01-02 DIAGNOSIS — E039 Hypothyroidism, unspecified: Secondary | ICD-10-CM

## 2012-01-02 DIAGNOSIS — G4700 Insomnia, unspecified: Secondary | ICD-10-CM

## 2012-01-02 DIAGNOSIS — E119 Type 2 diabetes mellitus without complications: Secondary | ICD-10-CM

## 2012-01-02 DIAGNOSIS — F411 Generalized anxiety disorder: Secondary | ICD-10-CM

## 2012-01-02 DIAGNOSIS — E785 Hyperlipidemia, unspecified: Secondary | ICD-10-CM

## 2012-01-02 MED ORDER — TEMAZEPAM 30 MG PO CAPS: 30.0000 mg | ORAL_CAPSULE | Freq: Every evening | ORAL | Status: DC | PRN

## 2012-01-02 MED ORDER — CLONAZEPAM 0.5 MG PO TABS: 0.5000 mg | ORAL_TABLET | Freq: Three times a day (TID) | ORAL | Status: DC | PRN

## 2012-01-02 NOTE — Progress Notes (Signed)
Subjective:     Patient ID: ALEXANDRINA FIORINI, female   DOB: Mar 27, 1941, 71 y.o.   MRN: [id number]  HPI The PT is here for follow up and re-evaluation of chronic medical conditions, medication management and review of any available recent lab and radiology data.  Preventive health is updated, specifically  Cancer screening and Immunization.   Questions or concerns regarding consultations or procedures which the PT has had in the interim are  addressed. The PT denies any adverse reactions to current medications since the last visit.  Reports uncontrolled anxiety and irritability as well as poor sleep despite good hygiene. Denies uncontrolled  Depression. Reports fluctuation in blood sugars and poor eating habits   Review of Systems    See HPI Denies recent fever or chills. Denies sinus pressure, nasal congestion, ear pain or sore throat. Denies chest congestion, productive cough or wheezing. Denies chest pains, palpitations and leg swelling Denies abdominal pain, nausea, vomiting,diarrhea or constipation.   Denies dysuria, frequency, hesitancy or incontinence. Denies uncontrolled  joint pain, swelling and limitation in mobility. Denies headaches, seizures, numbness, or tingling. . Denies skin break down or rash.     Objective:   Physical Exam Patient alert and oriented and in no cardiopulmonary distress.  HEENT: No facial asymmetry, EOMI, no sinus tenderness,  oropharynx pink and moist.  Neck supple no adenopathy.  Chest: Clear to auscultation bilaterally.  CVS: S1, S2 no murmurs, no S3.  ABD: Soft non tender. Bowel sounds normal.  Ext: No edema  MS: reduced ROM spine, shoulders, hips and knees.  Skin: Intact, no ulcerations or rash noted.  Psych: Good eye contact, normal affect. Memory intact  anxious and mildly  depressed appearing.  CNS: CN 2-12 intact, power, tone and sensation normal throughout.     Assessment:         Plan:

## 2012-01-02 NOTE — Assessment & Plan Note (Signed)
Uncontrolled, poor diet and non adherence to treatment plan. When mental health issues improve so will her bloodsugars

## 2012-01-02 NOTE — Assessment & Plan Note (Signed)
Uncontrolled, start klonopin stop ativan and start med forsleep

## 2012-01-02 NOTE — Assessment & Plan Note (Signed)
Hyperlipidemia:Low fat diet discussed and encouraged.  Controlled, no change in medication rept labs due

## 2012-01-02 NOTE — Assessment & Plan Note (Signed)
Followed by endo on annual basis

## 2012-01-02 NOTE — Patient Instructions (Addendum)
F/U in 6 weeks.  Stop ativan   New medication for anxiety and sleep  Labs today  Mammogram due in Feb, we will schedule per your request.   Pls check and document blood sugars at least 2 times daily, prerferrably 3...  Fasting   80 to 120 is the goal   2 hrs after any meal or bedtime, goal is 130 to 170

## 2012-01-02 NOTE — Assessment & Plan Note (Signed)
Uncontrolled get on avg 4 to 5 hrs sleep, hygiene reviewed, pt to start med also

## 2012-01-03 LAB — HEMOGLOBIN A1C: Mean Plasma Glucose: 180 mg/dL — ABNORMAL HIGH (ref <117)

## 2012-01-03 LAB — COMPREHENSIVE METABOLIC PANEL
AST: 15 U/L (ref 0–37)
Alkaline Phosphatase: 124 U/L — ABNORMAL HIGH (ref 39–117)
BUN: 26 mg/dL — ABNORMAL HIGH (ref 6–23)
Calcium: 9.5 mg/dL (ref 8.4–10.5)
Creat: 0.84 mg/dL (ref 0.50–1.10)
Glucose, Bld: 62 mg/dL — ABNORMAL LOW (ref 70–99)

## 2012-01-03 LAB — MICROALBUMIN / CREATININE URINE RATIO
Creatinine, Urine: 161.8 mg/dL
Microalb Creat Ratio: 171.9 mg/g — ABNORMAL HIGH (ref 0.0–30.0)

## 2012-01-03 LAB — LIPID PANEL
Cholesterol: 179 mg/dL (ref 0–200)
Triglycerides: 116 mg/dL (ref <150)

## 2012-01-05 ENCOUNTER — Emergency Department (HOSPITAL_COMMUNITY): Payer: Medicare Other

## 2012-01-05 ENCOUNTER — Inpatient Hospital Stay (HOSPITAL_COMMUNITY)
Admission: EM | Admit: 2012-01-05 | Discharge: 2012-01-07 | DRG: 195 | Disposition: A | Discharge status: Home / Self Care | Payer: Medicare Other | Attending: Family Medicine | Admitting: Family Medicine

## 2012-01-05 ENCOUNTER — Encounter (HOSPITAL_COMMUNITY): Payer: Self-pay

## 2012-01-05 DIAGNOSIS — M25569 Pain in unspecified knee: Secondary | ICD-10-CM

## 2012-01-05 DIAGNOSIS — Z96659 Presence of unspecified artificial knee joint: Secondary | ICD-10-CM

## 2012-01-05 DIAGNOSIS — R946 Abnormal results of thyroid function studies: Secondary | ICD-10-CM

## 2012-01-05 DIAGNOSIS — D649 Anemia, unspecified: Secondary | ICD-10-CM

## 2012-01-05 DIAGNOSIS — E039 Hypothyroidism, unspecified: Secondary | ICD-10-CM

## 2012-01-05 DIAGNOSIS — M25519 Pain in unspecified shoulder: Secondary | ICD-10-CM

## 2012-01-05 DIAGNOSIS — M25559 Pain in unspecified hip: Secondary | ICD-10-CM

## 2012-01-05 DIAGNOSIS — M79606 Pain in leg, unspecified: Secondary | ICD-10-CM

## 2012-01-05 DIAGNOSIS — G5600 Carpal tunnel syndrome, unspecified upper limb: Secondary | ICD-10-CM

## 2012-01-05 DIAGNOSIS — R609 Edema, unspecified: Secondary | ICD-10-CM

## 2012-01-05 DIAGNOSIS — R5381 Other malaise: Secondary | ICD-10-CM

## 2012-01-05 DIAGNOSIS — L819 Disorder of pigmentation, unspecified: Secondary | ICD-10-CM

## 2012-01-05 DIAGNOSIS — F411 Generalized anxiety disorder: Secondary | ICD-10-CM | POA: Diagnosis present

## 2012-01-05 DIAGNOSIS — M549 Dorsalgia, unspecified: Secondary | ICD-10-CM

## 2012-01-05 DIAGNOSIS — D638 Anemia in other chronic diseases classified elsewhere: Secondary | ICD-10-CM

## 2012-01-05 DIAGNOSIS — R1310 Dysphagia, unspecified: Secondary | ICD-10-CM

## 2012-01-05 DIAGNOSIS — F329 Major depressive disorder, single episode, unspecified: Secondary | ICD-10-CM

## 2012-01-05 DIAGNOSIS — G4730 Sleep apnea, unspecified: Secondary | ICD-10-CM

## 2012-01-05 DIAGNOSIS — J189 Pneumonia, unspecified organism: Principal | ICD-10-CM | POA: Diagnosis present

## 2012-01-05 DIAGNOSIS — M1710 Unilateral primary osteoarthritis, unspecified knee: Secondary | ICD-10-CM

## 2012-01-05 DIAGNOSIS — E669 Obesity, unspecified: Secondary | ICD-10-CM

## 2012-01-05 DIAGNOSIS — E119 Type 2 diabetes mellitus without complications: Secondary | ICD-10-CM | POA: Diagnosis present

## 2012-01-05 DIAGNOSIS — L600 Ingrowing nail: Secondary | ICD-10-CM

## 2012-01-05 DIAGNOSIS — K222 Esophageal obstruction: Secondary | ICD-10-CM

## 2012-01-05 DIAGNOSIS — R0489 Hemorrhage from other sites in respiratory passages: Secondary | ICD-10-CM

## 2012-01-05 DIAGNOSIS — G4700 Insomnia, unspecified: Secondary | ICD-10-CM

## 2012-01-05 DIAGNOSIS — N189 Chronic kidney disease, unspecified: Secondary | ICD-10-CM

## 2012-01-05 DIAGNOSIS — K219 Gastro-esophageal reflux disease without esophagitis: Secondary | ICD-10-CM

## 2012-01-05 DIAGNOSIS — R32 Unspecified urinary incontinence: Secondary | ICD-10-CM

## 2012-01-05 DIAGNOSIS — K5900 Constipation, unspecified: Secondary | ICD-10-CM

## 2012-01-05 DIAGNOSIS — H409 Unspecified glaucoma: Secondary | ICD-10-CM

## 2012-01-05 DIAGNOSIS — D509 Iron deficiency anemia, unspecified: Secondary | ICD-10-CM | POA: Diagnosis present

## 2012-01-05 DIAGNOSIS — I10 Essential (primary) hypertension: Secondary | ICD-10-CM

## 2012-01-05 DIAGNOSIS — R042 Hemoptysis: Secondary | ICD-10-CM | POA: Diagnosis present

## 2012-01-05 DIAGNOSIS — E785 Hyperlipidemia, unspecified: Secondary | ICD-10-CM

## 2012-01-05 DIAGNOSIS — D492 Neoplasm of unspecified behavior of bone, soft tissue, and skin: Secondary | ICD-10-CM

## 2012-01-05 DIAGNOSIS — R109 Unspecified abdominal pain: Secondary | ICD-10-CM

## 2012-01-05 DIAGNOSIS — F419 Anxiety disorder, unspecified: Secondary | ICD-10-CM

## 2012-01-05 DIAGNOSIS — R110 Nausea: Secondary | ICD-10-CM

## 2012-01-05 LAB — DIFFERENTIAL
Basophils Absolute: 0 10*3/uL (ref 0.0–0.1)
Basophils Relative: 0 % (ref 0–1)
Lymphocytes Relative: 15 % (ref 12–46)
Monocytes Absolute: 0.7 10*3/uL (ref 0.1–1.0)
Neutro Abs: 8.8 10*3/uL — ABNORMAL HIGH (ref 1.7–7.7)
Neutrophils Relative %: 78 % — ABNORMAL HIGH (ref 43–77)

## 2012-01-05 LAB — CBC
HCT: 30.7 % — ABNORMAL LOW (ref 36.0–46.0)
Hemoglobin: 9.8 g/dL — ABNORMAL LOW (ref 12.0–15.0)
RDW: 16.4 % — ABNORMAL HIGH (ref 11.5–15.5)
WBC: 11.3 10*3/uL — ABNORMAL HIGH (ref 4.0–10.5)

## 2012-01-05 LAB — BLOOD GAS, ARTERIAL
Drawn by: 23534
FIO2: 0.21 %
O2 Content: 21 L/min
Patient temperature: 37
TCO2: 23.5 mmol/L (ref 0–100)
pCO2 arterial: 35.9 mmHg (ref 35.0–45.0)
pH, Arterial: 7.464 — ABNORMAL HIGH (ref 7.350–7.400)

## 2012-01-05 LAB — BASIC METABOLIC PANEL
CO2: 29 mEq/L (ref 19–32)
Chloride: 104 mEq/L (ref 96–112)
Creatinine, Ser: 0.74 mg/dL (ref 0.50–1.10)
GFR calc Af Amer: >90 mL/min (ref >90)
Potassium: 3.9 mEq/L (ref 3.5–5.1)

## 2012-01-05 MED ORDER — DEXTROSE 50 % IV SOLN: 50.0000 mL | Freq: Once | INTRAVENOUS | Status: DC

## 2012-01-05 MED ORDER — IOHEXOL 300 MG/ML  SOLN
80.0000 mL | Freq: Once | INTRAMUSCULAR | Status: AC | PRN
  Administered 2012-01-05: 80 mL via INTRAVENOUS

## 2012-01-05 MED ORDER — DEXTROSE 50 % IV SOLN
INTRAVENOUS | Status: AC
  Administered 2012-01-05: 50 mL
  Filled 2012-01-05: qty 50

## 2012-01-05 MED ORDER — SODIUM CHLORIDE 0.9 % IV SOLN
Freq: Once | INTRAVENOUS | Status: AC
  Administered 2012-01-05: 19:00:00 via INTRAVENOUS

## 2012-01-05 MED ORDER — MOXIFLOXACIN HCL IN NACL 400 MG/250ML IV SOLN
400.0000 mg | Freq: Once | INTRAVENOUS | Status: AC
  Administered 2012-01-05: 400 mg via INTRAVENOUS
  Filled 2012-01-05: qty 250

## 2012-01-05 NOTE — ED Notes (Signed)
Called Dr. Onalee Hua and reported pt's low cbg

## 2012-01-05 NOTE — H&P (Signed)
PCP:   Syliva Overman, MD, MD   Chief Complaint:  Coughing up blood  HPI: 71 year old female with multiple medical problems who was diagnosed with influenza approximately 3 weeks ago and had a full recovery from that. She states earlier today she started having cough with some bloody sputum production. This has persisted throughout the day so she came to ER for further evaluation. She does have chronic anemia I don't know what her baseline hemoglobin is and she is anemic today with a hemoglobin of 9.8. Her chest x-ray and CAT scan of her chest shows questionable hemorrhage. She's not had  any profuse bleeding from the lungs in the emergency department. She denies any recent fevers, body aches or chills. She did not receive any antibiotics recently.  Review of Systems:  Otherwise negative  Past Medical History: Past Medical History  Diagnosis Date  . Diabetes mellitus   . Hypertension   . Tobacco abuse   . Myasthenia gravis   . Arthritis   . Gastroesophageal reflux   . Thyroid disease   . Sleep apnea   . Iron deficiency anemia   . Depression   . Hyperlipidemia   . Obesity   . Carpal tunnel syndrome   . Shoulder pain   . Impingement syndrome, shoulder   . Pulmonary HTN   . Mitral regurgitation   . Schatzki's ring     Last EGD w/ dilation 02/08/11, 2009 & 2007  . Esophagitis, erosive 2009  . Diverticulosis 07/2004    Colonscopy Dr Jena Gauss   Past Surgical History  Procedure Date  . Rt tka 05/13/07    Dr. Romeo Apple  . Right carpal tunnel release 02/01/2009  . Incontinence surgery 08/26/09    Tananbaum  . Cholecystectomy   . Abdominal hysterectomy   . Eye surgery     Medications: Prior to Admission medications   Medication Sig Start Date End Date Taking? Authorizing Provider  ACCU-CHEK AVIVA PLUS test strip USE TO TEST BLOOD SUGAR 3TIMES A DAY. 08/03/11   Syliva Overman, MD  albuterol (PROVENTIL) (2.5 MG/3ML) 0.083% nebulizer solution Take 2.5 mg by nebulization every 6  (six) hours as needed.      Historical Provider, MD  benazepril (LOTENSIN) 20 MG tablet Take 1 tablet (20 mg total) by mouth daily. 05/30/11 05/29/12  Syliva Overman, MD  CELEXA 40 MG tablet TAKE ONE TABLET BY MOUTH DAILY. 10/16/11   Syliva Overman, MD  CLARITIN 10 MG tablet TAKE ONE TABLET BY MOUTH DAILY. 08/15/11   Syliva Overman, MD  clonazePAM (KLONOPIN) 0.5 MG tablet Take 1 tablet (0.5 mg total) by mouth 3 (three) times daily as needed for anxiety. 01/02/12 02/01/12  Syliva Overman, MD  cycloSPORINE (RESTASIS) 0.05 % ophthalmic emulsion Place 1 drop into both eyes 2 (two) times daily.      Historical Provider, MD  DELTASONE 5 MG tablet TAKE 1 TABLET BY MOUTH DAILY. 11/15/11   Syliva Overman, MD  diltiazem (CARTIA XT) 240 MG 24 hr capsule Twice daily, dose increase effective 05/30/2011 05/30/11   Syliva Overman, MD  DITROPAN XL 10 MG 24 hr tablet TAKE 1 TABLET BY MOUTH DAILY. 11/15/11   Syliva Overman, MD  EFFEXOR XR 150 MG 24 hr capsule TAKE 1 CAPSULE BY MOUTH  DAILY. 10/16/11   Syliva Overman, MD  furosemide (LASIX) 40 MG tablet Take 40 mg by mouth 2 (two) times daily.      Historical Provider, MD  gabapentin (NEURONTIN) 300 MG capsule Take 1 capsule (300 mg total) by  mouth 3 (three) times daily. 10/16/11   Fuller Canada, MD  GLUCOVANCE 2.5-500 MG per tablet TAKE (2) TABLETS BY MOUTH TWICE DAILY. 11/15/11   Syliva Overman, MD  insulin glargine (LANTUS) 100 UNIT/ML injection 20 units at bedtime, 7 units in the morning . Dose increase effective 08/02/2011 08/02/11   Syliva Overman, MD  iron polysaccharides (NIFEREX) 150 MG capsule Take 150 mg by mouth 2 (two) times daily.      Historical Provider, MD  LORTAB 10 10-500 MG per tablet TAKE 1 TABLET BY MOUTH 3 TIMES DAILY. 11/15/11   Syliva Overman, MD  losartan (COZAAR) 100 MG tablet Take 1 tablet (100 mg total) by mouth daily. 05/30/11 05/29/12  Syliva Overman, MD  nitrofurantoin (MACRODANTIN) 100 MG capsule Take 100 mg by mouth at  bedtime.      Historical Provider, MD  PHENERGAN 25 MG tablet TAKE 1 TABLET BY MOUTH   ONCE DAILY AS NEEDED FORNAUSEA 10/12/11   Syliva Overman, MD  polyethylene glycol [facility] / GLYCOLAX) packet Take 17 g by mouth as needed.      Historical Provider, MD  potassium chloride (KLOR-CON) 20 MEQ packet Take 20 mEq by mouth 3 (three) times daily.      Historical Provider, MD  PRILOSEC 20 MG capsule TAKE ONE CAPSULE BY MOUTH DAILY. 11/15/11   Syliva Overman, MD  PROAIR HFA 108 (90 BASE) MCG/ACT inhaler INHALE 2 PUFFS BY MOUTH  EVERY 6 HOURS AS NEEDED . 06/29/11   Syliva Overman, MD  Probiotic Product (ALIGN PO) Take 4 mg by mouth daily.      Historical Provider, MD  pyridostigmine (MESTINON) 60 MG tablet Take 1 tablet (60 mg total) by mouth 3 (three) times daily. 10/16/11   Syliva Overman, MD  rosuvastatin (CRESTOR) 20 MG tablet Take 1 tablet (20 mg total) by mouth daily. 09/30/11   Syliva Overman, MD  temazepam (RESTORIL) 30 MG capsule Take 1 capsule (30 mg total) by mouth at bedtime as needed for sleep. 01/02/12 02/01/12  Syliva Overman, MD  ZESTORETIC 20-12.5 MG per tablet TAKE (2) TABLETS BY MOUTHAT BEDTIME. 04/12/11   Syliva Overman, MD    Allergies:  No Known Allergies  Social History:  reports that she quit smoking about 8 years ago. She does not have any smokeless tobacco history on file. She reports that she does not drink alcohol or use illicit drugs.  Family History: Noncontributory  Physical Exam: Filed Vitals:   01/05/12 1646 01/05/12 1829 01/05/12 2035 01/05/12 2224  BP: 149/90 121/74 133/50 132/59  Pulse: 78 61 60 62  Temp: 100.1 F (37.8 C)  98.7 F (37.1 C)   TempSrc: Oral  Oral   Resp: 20 20 20 18   Height: 5\' 6"  (1.676 m)     Weight: 117.935 kg (260 lb)     SpO2: 96% 96% 94% 94%   BP 132/59  Pulse 62  Temp(Src) 98.7 F (37.1 C) (Oral)  Resp 18  Ht 5\' 6"  (1.676 m)  Wt 117.935 kg (260 lb)  BMI 41.97 kg/m2  SpO2 94% General appearance: alert, cooperative  and no distress Lungs: clear to auscultation bilaterally Heart: regular rate and rhythm, S1, S2 normal, no murmur, click, rub or gallop Abdomen: soft, non-tender; bowel sounds normal; no masses,  no organomegaly Extremities: extremities normal, atraumatic, no cyanosis or edema Pulses: 2+ and symmetric Skin: Skin color, texture, turgor normal. No rashes or lesions Neurologic: Grossly normal    Labs on Admission:   [facility] 01/05/12 1823  NA 138  K 3.9  CL 104  CO2 29  GLUCOSE 67*  BUN 10  CREATININE 0.74  CALCIUM 9.5  MG --  PHOS --    Basename 01/05/12 1823  WBC 11.3*  NEUTROABS 8.8*  HGB 9.8*  HCT 30.7*  MCV 88.5  PLT 290    Radiological Exams on Admission: Dg Chest 2 View  01/05/2012  *RADIOLOGY REPORT*  Clinical Data: Hemoptysis.  Ex-smoker.  CHEST - 2 VIEW  Comparison: 02/25/2011.  Findings: The cardiac silhouette remains borderline enlarged.  The left lung remains clear.  Interval patchy density in the right lower lobe.  Stable mild prominence of the interstitial markings. The lungs remain mildly hyperexpanded.  Unremarkable bones.  IMPRESSION:  1.  Right lower lobe pneumonia or pulmonary hemorrhage. 2.  Stable mild changes of COPD and chronic bronchitis.  Original Report Authenticated By: Darrol Angel, M.D.   Ct Chest W Contrast  01/05/2012  *RADIOLOGY REPORT*  Clinical Data: 71 year old female with cough, hemoptysis and left chest pain.  CT CHEST WITH CONTRAST  Technique:  Multidetector CT imaging of the chest was performed following the standard protocol during bolus administration of intravenous contrast.  Contrast: 80mL OMNIPAQUE IOHEXOL 300 MG/ML IV SOLN  Comparison: 01/05/2012  Findings: Cardiomegaly and mild coronary artery calcifications are identified. There is no evidence of pleural or pericardial effusion. There is no evidence of thoracic aortic aneurysm. Shotty mediastinal lymph nodes are present.  Airspace opacities throughout the right lower lobe and  posterior inferior right upper lobe are noted. A smaller amount of airspace opacity within the left lower lobe is present.  Primary considerations include multifocal pneumonia and hemorrhage.  If this represents hemorrhage, the etiology/origin is not identified. There is no evidence of discrete mass or endobronchial/endotracheal lesion.  A moderate hiatal hernia is noted. No acute or suspicious bony abnormalities are identified.  IMPRESSION: Bilateral airspace opacities, greatest in the right lower lobe and posterior inferior right upper lobe.  Primary considerations include pneumonia and hemorrhage.  If this represents hemorrhage, the etiology/origin is not identified.  Consider bronchoscopy as clinically indicated.  Moderate hiatal hernia.  Cardiomegaly and coronary artery disease.  Original Report Authenticated By: Rosendo Gros, M.D.    Assessment/Plan Present on Admission:  71 year old female with hemoptysis and  pneumonia  .Hemoptysis secondary pneumonia. Patient be transferred to [facility] team #5 triads service. Dr. [name] with pulmonology has already been made aware and will see the patient in consultation on arrival to [facility]. She currently is hemodynamically stable will serial her hemoglobins every 8 hours. We'll place on [facility]. [facility] staff to call [facility] and pulmonary service on arrival to the floor over there.  Marland KitchenCAP (community acquired pneumonia) [employer]  .DIABETES sliding scale insulin  .Anemia of other chronic disease monitor for acute bleeding  Full code    Alanya Vukelich A [phone] 01/05/2012, 10:38 PM

## 2012-01-05 NOTE — ED Notes (Signed)
Report given to Sedalia Muta, RN on [facility]

## 2012-01-05 NOTE — ED Notes (Signed)
Family at bedside. Patient was given ice chips per RN order. Family was given water. Patient was told she could have something to drink after the x-ray came back.

## 2012-01-05 NOTE — ED Notes (Signed)
Pt given sandwich 

## 2012-01-05 NOTE — ED Notes (Signed)
Report given to [facility] 

## 2012-01-05 NOTE — ED Notes (Signed)
Pt back from xray. When I walked in pt was eating hard candy from home. Pt states she felt shaky like her sugar was low.

## 2012-01-05 NOTE — ED Notes (Signed)
Pt and family aware that pt has a bed, 2004 at [facility].

## 2012-01-05 NOTE — ED Notes (Addendum)
Pt presents with cough, hemoptysis, and left side pain since this AM. Pt states she just got over the FLU 3 wks ago.

## 2012-01-05 NOTE — ED Provider Notes (Signed)
This chart was scribed for [employer]. Colon Branch, MD by Williemae Natter. The patient was seen in room APA14/APA14 and the patient's care was started at 5:20 PM.  CSN: [id number]  Arrival date & time 01/05/12  1640   First MD Initiated Contact with Patient 01/05/12 1708      Chief Complaint  Patient presents with  . Hemoptysis  . Cough  . Abdominal Pain    (Consider location/radiation/quality/duration/timing/severity/associated sxs/prior treatment) HPI [name] is a 71 y.o. female who presents to the Emergency Department complaining of hemoptysis since this morning. She reported having the flu a few weeks ago. She has an associated fever and some nausea but denies any vomiting. Pt has been treating with diabetic cough syrup with little to no improvement. Pt also report having right flank pain for the past 2-3 days. Pt denies stomach or chest pain.   Past Medical History  Diagnosis Date  . Diabetes mellitus   . Hypertension   . Tobacco abuse   . Myasthenia gravis   . Arthritis   . Gastroesophageal reflux   . Thyroid disease   . Sleep apnea   . Iron deficiency anemia   . Depression   . Hyperlipidemia   . Obesity   . Carpal tunnel syndrome   . Shoulder pain   . Impingement syndrome, shoulder   . Pulmonary HTN   . Mitral regurgitation   . Schatzki's ring     Last EGD w/ dilation 02/08/11, 2009 & 2007  . Esophagitis, erosive 2009  . Diverticulosis 07/2004    Colonscopy Dr Jena Gauss    Past Surgical History  Procedure Date  . Rt tka 05/13/07    Dr. Romeo Apple  . Right carpal tunnel release 02/01/2009  . Incontinence surgery 08/26/09    Tananbaum  . Cholecystectomy   . Abdominal hysterectomy   . Eye surgery     No family history on file.  History  Substance Use Topics  . Smoking status: Former Smoker -- 1.0 packs/day for 25 years    Quit date: 01/01/2004  . Smokeless tobacco: Not on file  . Alcohol Use: No    OB History    Grav Para Term Preterm Abortions TAB SAB  Ect Mult Living                  Review of Systems 10 Systems reviewed and are negative for acute change except as noted in the HPI.  Allergies  Review of patient's allergies indicates no known allergies.  Home Medications   Current Outpatient Rx  Name Route Sig Dispense Refill  . ACCU-CHEK AVIVA PLUS VI STRP  USE TO TEST BLOOD SUGAR 3TIMES A DAY. 100 each 12  . ALBUTEROL SULFATE (2.5 MG/3ML) 0.083% IN NEBU Nebulization Take 2.5 mg by nebulization every 6 (six) hours as needed.      Marland Kitchen BENAZEPRIL HCL 20 MG PO TABS Oral Take 1 tablet (20 mg total) by mouth daily. 30 tablet 11  . CELEXA 40 MG PO TABS  TAKE ONE TABLET BY MOUTH DAILY. 30 each 3  . CLARITIN 10 MG PO TABS  TAKE ONE TABLET BY MOUTH DAILY. 30 each 3  . CLONAZEPAM 0.5 MG PO TABS Oral Take 1 tablet (0.5 mg total) by mouth 3 (three) times daily as needed for anxiety. 90 tablet 1  . CYCLOSPORINE 0.05 % OP EMUL Both Eyes Place 1 drop into both eyes 2 (two) times daily.      . DELTASONE 5 MG PO TABS  TAKE 1 TABLET BY MOUTH DAILY. 30 each 3  . DILTIAZEM HCL ER COATED BEADS 240 MG PO CP24  Twice daily, dose increase effective 05/30/2011 60 capsule 11  . DITROPAN XL 10 MG PO TB24  TAKE 1 TABLET BY MOUTH DAILY. 30 each 3  . EFFEXOR XR 150 MG PO CP24  TAKE 1 CAPSULE BY MOUTH  DAILY. 30 each 3  . FUROSEMIDE 40 MG PO TABS Oral Take 40 mg by mouth 2 (two) times daily.      Marland Kitchen GABAPENTIN 300 MG PO CAPS Oral Take 1 capsule (300 mg total) by mouth 3 (three) times daily. 90 capsule 5  . GLUCOVANCE 2.5-500 MG PO TABS  TAKE (2) TABLETS BY MOUTH TWICE DAILY. 120 each 3  . INSULIN GLARGINE 100 UNIT/ML [place] SOLN  20 units at bedtime, 7 units in the morning . Dose increase effective 08/02/2011 10 mL 12  . POLYSACCHARIDE IRON COMPLEX 150 MG PO CAPS Oral Take 150 mg by mouth 2 (two) times daily.      Marland Kitchen LORTAB 10-500 MG PO TABS  TAKE 1 TABLET BY MOUTH 3 TIMES DAILY. 90 each 3  . LOSARTAN POTASSIUM 100 MG PO TABS Oral Take 1 tablet (100 mg total) by mouth  daily. 30 tablet 11  . MULTIVITAMIN PO Oral Take by mouth.      Marland Kitchen NITROFURANTOIN MACROCRYSTAL 100 MG PO CAPS Oral Take 100 mg by mouth at bedtime.      Marland Kitchen PHENERGAN 25 MG PO TABS  TAKE 1 TABLET BY MOUTH   ONCE DAILY AS NEEDED FORNAUSEA 30 each 2  . POLYETHYLENE GLYCOL 3350 PO PACK Oral Take 17 g by mouth as needed.      Marland Kitchen POTASSIUM CHLORIDE 20 MEQ PO PACK Oral Take 20 mEq by mouth 3 (three) times daily.      Marland Kitchen PRILOSEC 20 MG PO CPDR  TAKE ONE CAPSULE BY MOUTH DAILY. 30 each 3  . PROAIR HFA 108 (90 BASE) MCG/ACT IN AERS  INHALE 2 PUFFS BY MOUTH  EVERY 6 HOURS AS NEEDED . 8.5 each 0  . ALIGN PO Oral Take 4 mg by mouth daily.      Marland Kitchen PYRIDOSTIGMINE BROMIDE 60 MG PO TABS Oral Take 1 tablet (60 mg total) by mouth 3 (three) times daily. 90 tablet 3  . ROSUVASTATIN CALCIUM 20 MG PO TABS Oral Take 1 tablet (20 mg total) by mouth daily. 30 tablet 2  . TEMAZEPAM 30 MG PO CAPS Oral Take 1 capsule (30 mg total) by mouth at bedtime as needed for sleep. 30 capsule 1  . ZESTORETIC 20-12.5 MG PO TABS  TAKE (2) TABLETS BY MOUTHAT BEDTIME. 60 each 1    BP 149/90  Pulse 78  Temp(Src) 100.1 F (37.8 C) (Oral)  Resp 20  Ht 5\' 6"  (1.676 m)  Wt 260 lb (117.935 kg)  BMI 41.97 kg/m2  SpO2 96%  Physical Exam  Nursing note and vitals reviewed. Constitutional: She is oriented to person, place, and time. She appears well-developed and well-nourished.  HENT:  Head: Normocephalic and atraumatic.  Neck: Normal range of motion. Neck supple.  Cardiovascular: Normal rate and regular rhythm.   Pulmonary/Chest: Effort normal and breath sounds normal.  Neurological: She is alert and oriented to person, place, and time.  Skin: Skin is warm and dry.  Psychiatric: She has a normal mood and affect. Her behavior is normal.    ED Course  Procedures (including critical care time)  Labs Reviewed  CBC -  Abnormal; Notable for the following:    WBC 11.3 (*)    RBC 3.47 (*)    Hemoglobin 9.8 (*)    HCT 30.7 (*)    RDW  16.4 (*)    All other components within normal limits  DIFFERENTIAL - Abnormal; Notable for the following:    Neutrophils Relative 78 (*)    Neutro Abs 8.8 (*)    All other components within normal limits  BASIC METABOLIC PANEL - Abnormal; Notable for the following:    Glucose, Bld 67 (*)    GFR calc non Af Amer 85 (*)    All other components within normal limits  BLOOD GAS, ARTERIAL - Abnormal; Notable for the following:    pH, Arterial 7.464 (*)    pO2, Arterial 72.8 (*)    Bicarbonate 25.4 (*)    All other components within normal limits   Dg Chest 2 View  01/05/2012  *RADIOLOGY REPORT*  Clinical Data: Hemoptysis.  Ex-smoker.  CHEST - 2 VIEW  Comparison: 02/25/2011.  Findings: The cardiac silhouette remains borderline enlarged.  The left lung remains clear.  Interval patchy density in the right lower lobe.  Stable mild prominence of the interstitial markings. The lungs remain mildly hyperexpanded.  Unremarkable bones.  IMPRESSION:  1.  Right lower lobe pneumonia or pulmonary hemorrhage. 2.  Stable mild changes of COPD and chronic bronchitis.  Original Report Authenticated By: Darrol Angel, M.D.   Ct Chest W Contrast  01/05/2012  *RADIOLOGY REPORT*  Clinical Data: 71 year old female with cough, hemoptysis and left chest pain.  CT CHEST WITH CONTRAST  Technique:  Multidetector CT imaging of the chest was performed following the standard protocol during bolus administration of intravenous contrast.  Contrast: 80mL OMNIPAQUE IOHEXOL 300 MG/ML IV SOLN  Comparison: 01/05/2012  Findings: Cardiomegaly and mild coronary artery calcifications are identified. There is no evidence of pleural or pericardial effusion. There is no evidence of thoracic aortic aneurysm. Shotty mediastinal lymph nodes are present.  Airspace opacities throughout the right lower lobe and posterior inferior right upper lobe are noted. A smaller amount of airspace opacity within the left lower lobe is present.  Primary  considerations include multifocal pneumonia and hemorrhage.  If this represents hemorrhage, the etiology/origin is not identified. There is no evidence of discrete mass or endobronchial/endotracheal lesion.  A moderate hiatal hernia is noted. No acute or suspicious bony abnormalities are identified.  IMPRESSION: Bilateral airspace opacities, greatest in the right lower lobe and posterior inferior right upper lobe.  Primary considerations include pneumonia and hemorrhage.  If this represents hemorrhage, the etiology/origin is not identified.  Consider bronchoscopy as clinically indicated.  Moderate hiatal hernia.  Cardiomegaly and coronary artery disease.  Original Report Authenticated By: Rosendo Gros, M.D.   [place] Consult with Dr. Onalee Hua to admit pt if admission to [facility] or to [facility] needed. 2015 Consult with Dr. Juanetta Gosling. His recommendation is transfer to [facility] due to possible need for CT surgery. 2200 Spoke with Dr. [name], pulmonologist on call. He will arrange pulmonary consult when patient is at [facility].Recommended antibiotic coverage. 2216 Spoke with Dr. Onalee Hua who will complete the admission for the patient to [facility], telemetry.  MDM  Patient with hemoptysis that began this morning. Recently had the flu. Labs with stable hgb. Chest xray with pna vs pulmonary hemorrhage. CT confirmed pna and hemorrhage. Arrangements for admission to hospitalist service completed. Consultation by pulmonology at [facility] arranged. Patient / Family / Caregiver informed of clinical course, understand medical  decision-making process, and agree with plan.Pt stable in ED with no significant deterioration in condition..The patient appears reasonably stabilized for transfer considering the current resources, flow, and capabilities available in the ED at this time, and I doubt any other [facility] requiring further screening and/or treatment in the ED prior to transfer.   I personally performed the services described in this documentation,  which was scribed in my presence. The recorded information has been reviewed and considered.  CRITICAL CARE Performed by: Annamarie Dawley.   Total critical care time: 50 Critical care time was exclusive of separately billable procedures and treating other patients.  Critical care was necessary to treat or prevent imminent or life-threatening deterioration.  Critical care was time spent personally by me on the following activities: development of treatment plan with patient and/or surrogate as well as nursing, discussions with consultants, evaluation of patient's response to treatment, examination of patient, obtaining history from patient or surrogate, ordering and performing treatments and interventions, ordering and review of laboratory studies, ordering and review of radiographic studies, pulse oximetry and re-evaluation of patient's condition.   Nicoletta Dress. Colon Branch, MD 01/05/12 2223

## 2012-01-06 ENCOUNTER — Encounter (HOSPITAL_COMMUNITY): Payer: Self-pay | Admitting: *Deleted

## 2012-01-06 DIAGNOSIS — R042 Hemoptysis: Secondary | ICD-10-CM

## 2012-01-06 DIAGNOSIS — J13 Pneumonia due to Streptococcus pneumoniae: Secondary | ICD-10-CM

## 2012-01-06 LAB — GLUCOSE, CAPILLARY
Glucose-Capillary: 55 mg/dL — ABNORMAL LOW (ref 70–99)
Glucose-Capillary: 134 mg/dL — ABNORMAL HIGH (ref 70–99)

## 2012-01-06 LAB — BASIC METABOLIC PANEL
CO2: 26 mEq/L (ref 19–32)
Calcium: 9.4 mg/dL (ref 8.4–10.5)
GFR calc Af Amer: >90 mL/min (ref >90)
Sodium: 143 mEq/L (ref 135–145)

## 2012-01-06 LAB — CULTURE, BLOOD (ROUTINE X 2)
Culture  Setup Time: 201301061059
Culture: NO GROWTH

## 2012-01-06 LAB — HEMOGLOBIN AND HEMATOCRIT, BLOOD
HCT: 30.1 % — ABNORMAL LOW (ref 36.0–46.0)
Hemoglobin: 9.6 g/dL — ABNORMAL LOW (ref 12.0–15.0)

## 2012-01-06 LAB — LEGIONELLA ANTIGEN, URINE

## 2012-01-06 LAB — STREP PNEUMONIAE URINARY ANTIGEN: Strep Pneumo Urinary Antigen: NEGATIVE

## 2012-01-06 MED ORDER — HYDROCODONE-ACETAMINOPHEN 5-325 MG PO TABS
1.0000 | ORAL_TABLET | Freq: Four times a day (QID) | ORAL | Status: DC | PRN
  Administered 2012-01-06: 1 via ORAL
  Filled 2012-01-06: qty 1

## 2012-01-06 MED ORDER — SODIUM CHLORIDE 0.9 % IJ SOLN
3.0000 mL | Freq: Two times a day (BID) | INTRAMUSCULAR | Status: DC
  Administered 2012-01-06 – 2012-01-07 (×3): 3 mL via INTRAVENOUS

## 2012-01-06 MED ORDER — BENAZEPRIL HCL 20 MG PO TABS
20.0000 mg | ORAL_TABLET | Freq: Every day | ORAL | Status: DC
  Administered 2012-01-06 – 2012-01-07 (×2): 20 mg via ORAL
  Filled 2012-01-06 (×2): qty 1

## 2012-01-06 MED ORDER — ALUM & MAG HYDROXIDE-SIMETH 200-200-20 MG/5ML PO SUSP
30.0000 mL | Freq: Once | ORAL | Status: AC
  Administered 2012-01-07: 30 mL via ORAL
  Filled 2012-01-06 (×2): qty 30

## 2012-01-06 MED ORDER — NITROFURANTOIN MACROCRYSTAL 100 MG PO CAPS
100.0000 mg | ORAL_CAPSULE | Freq: Every day | ORAL | Status: DC
  Administered 2012-01-06: 100 mg via ORAL
  Filled 2012-01-06 (×2): qty 1

## 2012-01-06 MED ORDER — GABAPENTIN 300 MG PO CAPS
300.0000 mg | ORAL_CAPSULE | Freq: Three times a day (TID) | ORAL | Status: DC
  Administered 2012-01-06 – 2012-01-07 (×3): 300 mg via ORAL
  Filled 2012-01-06 (×5): qty 1

## 2012-01-06 MED ORDER — SODIUM CHLORIDE 0.9 % IJ SOLN: 3.0000 mL | INTRAMUSCULAR | Status: DC | PRN

## 2012-01-06 MED ORDER — CITALOPRAM HYDROBROMIDE 40 MG PO TABS
40.0000 mg | ORAL_TABLET | Freq: Every day | ORAL | Status: DC
  Administered 2012-01-06 – 2012-01-07 (×2): 40 mg via ORAL
  Filled 2012-01-06 (×2): qty 1

## 2012-01-06 MED ORDER — ZOLPIDEM TARTRATE 5 MG PO TABS: 5.0000 mg | ORAL_TABLET | Freq: Every evening | ORAL | Status: DC | PRN

## 2012-01-06 MED ORDER — SODIUM CHLORIDE 0.9 % IV SOLN: 250.0000 mL | INTRAVENOUS | Status: DC | PRN

## 2012-01-06 MED ORDER — INSULIN ASPART 100 UNIT/ML ~~LOC~~ SOLN
0.0000 [IU] | Freq: Three times a day (TID) | SUBCUTANEOUS | Status: DC
  Administered 2012-01-06: 5 [IU] via SUBCUTANEOUS
  Administered 2012-01-06: 2 [IU] via SUBCUTANEOUS
  Administered 2012-01-07: 11 [IU] via SUBCUTANEOUS
  Filled 2012-01-06: qty 3

## 2012-01-06 MED ORDER — MOXIFLOXACIN HCL IN NACL 400 MG/250ML IV SOLN
400.0000 mg | INTRAVENOUS | Status: DC
  Administered 2012-01-06: 400 mg via INTRAVENOUS
  Filled 2012-01-06 (×3): qty 250

## 2012-01-06 MED ORDER — CYCLOSPORINE 0.05 % OP EMUL
1.0000 [drp] | Freq: Two times a day (BID) | OPHTHALMIC | Status: DC
  Administered 2012-01-06 – 2012-01-07 (×2): 1 [drp] via OPHTHALMIC
  Filled 2012-01-06 (×4): qty 1

## 2012-01-06 MED ORDER — PYRIDOSTIGMINE BROMIDE 60 MG PO TABS
60.0000 mg | ORAL_TABLET | Freq: Three times a day (TID) | ORAL | Status: DC
  Administered 2012-01-06 – 2012-01-07 (×3): 60 mg via ORAL
  Filled 2012-01-06 (×5): qty 1

## 2012-01-06 MED ORDER — CLONAZEPAM 0.5 MG PO TABS
0.5000 mg | ORAL_TABLET | Freq: Three times a day (TID) | ORAL | Status: DC | PRN
  Administered 2012-01-06: 0.5 mg via ORAL
  Filled 2012-01-06: qty 1

## 2012-01-06 MED ORDER — FUROSEMIDE 40 MG PO TABS
40.0000 mg | ORAL_TABLET | Freq: Two times a day (BID) | ORAL | Status: DC
  Administered 2012-01-06 – 2012-01-07 (×3): 40 mg via ORAL
  Filled 2012-01-06 (×4): qty 1

## 2012-01-06 MED ORDER — POTASSIUM CHLORIDE 20 MEQ PO PACK
20.0000 meq | PACK | Freq: Three times a day (TID) | ORAL | Status: DC
  Filled 2012-01-06 (×2): qty 1

## 2012-01-06 MED ORDER — PREDNISONE 5 MG PO TABS
5.0000 mg | ORAL_TABLET | Freq: Every day | ORAL | Status: DC
  Administered 2012-01-07: 5 mg via ORAL
  Filled 2012-01-06 (×2): qty 1

## 2012-01-06 MED ORDER — TEMAZEPAM 15 MG PO CAPS: 30.0000 mg | ORAL_CAPSULE | Freq: Every evening | ORAL | Status: DC | PRN

## 2012-01-06 MED ORDER — ALBUTEROL SULFATE (5 MG/ML) 0.5% IN NEBU: 2.5000 mg | INHALATION_SOLUTION | RESPIRATORY_TRACT | Status: DC | PRN

## 2012-01-06 MED ORDER — POTASSIUM CHLORIDE CRYS ER 20 MEQ PO TBCR
20.0000 meq | EXTENDED_RELEASE_TABLET | Freq: Three times a day (TID) | ORAL | Status: DC
  Administered 2012-01-06 – 2012-01-07 (×3): 20 meq via ORAL
  Filled 2012-01-06 (×5): qty 1

## 2012-01-06 MED ORDER — DILTIAZEM HCL ER COATED BEADS 240 MG PO CP24
240.0000 mg | ORAL_CAPSULE | Freq: Every day | ORAL | Status: DC
  Administered 2012-01-06 – 2012-01-07 (×2): 240 mg via ORAL
  Filled 2012-01-06 (×2): qty 1

## 2012-01-06 NOTE — Consult Note (Signed)
Name: [name] MRN: [id number] DOB: Nov 04, 1941  DOS:  01/05/2011    CRITICAL CARE MEDICINE ADMISSION / CONSULTATION NOTE  Referring Physician  Lenny Pastel  Reason For Consult  Hemoptysis  History Of Present Illness : The patient is seen in consultation at the request of Dr Claiborne Billings for evaluation of new onset hemoptysis.   The patient is a pleasant 70/F with DM, HTN who recently had viral URTI (3 weeks back). She presented with cough with hemoptysis starting this morning. As per the patient she has having cough for the last few days and this morning noticed blood tinged with sputum. She had 3 such episodes and each episode she coughs up 1 teaspoon full of sputum with blood. So far she had 15 ml of hemoptysis. She also complains of fever with chills and rigors. She denies any chest pain, joint pain, dysuria or hematuria. Not on any blood thinner or anticoagulants.  ROS: 10 points ROS negative except in HPI.   Past Medical History  Diagnosis Date  . Diabetes mellitus   . Hypertension   . Tobacco abuse   . Myasthenia gravis   . Arthritis   . Gastroesophageal reflux   . Thyroid disease   . Sleep apnea   . Iron deficiency anemia   . Depression   . Hyperlipidemia   . Obesity   . Carpal tunnel syndrome   . Shoulder pain   . Impingement syndrome, shoulder   . Pulmonary HTN   . Mitral regurgitation   . Schatzki's ring     Last EGD w/ dilation 02/08/11, 2009 & 2007  . Esophagitis, erosive 2009  . Diverticulosis 07/2004    Colonscopy Dr Jena Gauss    Past Surgical History  Procedure Date  . Rt tka 05/13/07    Dr. Romeo Apple  . Right carpal tunnel release 02/01/2009  . Incontinence surgery 08/26/09    Tananbaum  . Cholecystectomy   . Abdominal hysterectomy   . Eye surgery     Prior to Admission medications   Medication Sig Start Date End Date Taking? Authorizing Provider  albuterol (PROVENTIL) (2.5 MG/3ML) 0.083% nebulizer solution Take 2.5 mg by nebulization every 6 (six)  hours as needed.     Yes Historical Provider, MD  benazepril (LOTENSIN) 20 MG tablet Take 1 tablet (20 mg total) by mouth daily. 05/30/11 05/29/12 Yes Syliva Overman, MD  CELEXA 40 MG tablet TAKE ONE TABLET BY MOUTH DAILY. 10/16/11  Yes Syliva Overman, MD  CLARITIN 10 MG tablet TAKE ONE TABLET BY MOUTH DAILY. 08/15/11  Yes Syliva Overman, MD  clonazePAM (KLONOPIN) 0.5 MG tablet Take 1 tablet (0.5 mg total) by mouth 3 (three) times daily as needed for anxiety. 01/02/12 02/01/12 Yes Syliva Overman, MD  cycloSPORINE (RESTASIS) 0.05 % ophthalmic emulsion Place 1 drop into both eyes 2 (two) times daily.     Yes Historical Provider, MD  DELTASONE 5 MG tablet TAKE 1 TABLET BY MOUTH DAILY. 11/15/11  Yes Syliva Overman, MD  diltiazem (CARTIA XT) 240 MG 24 hr capsule Twice daily, dose increase effective 05/30/2011 05/30/11  Yes Syliva Overman, MD  DITROPAN XL 10 MG 24 hr tablet TAKE 1 TABLET BY MOUTH DAILY. 11/15/11  Yes Syliva Overman, MD  EFFEXOR XR 150 MG 24 hr capsule TAKE 1 CAPSULE BY MOUTH  DAILY. 10/16/11  Yes Syliva Overman, MD  furosemide (LASIX) 40 MG tablet Take 40 mg by mouth 2 (two) times daily.     Yes Historical Provider, MD  gabapentin (NEURONTIN) 300 MG  capsule Take 1 capsule (300 mg total) by mouth 3 (three) times daily. 10/16/11  Yes Fuller Canada, MD  GLUCOVANCE 2.5-500 MG per tablet TAKE (2) TABLETS BY MOUTH TWICE DAILY. 11/15/11  Yes Syliva Overman, MD  insulin glargine (LANTUS) 100 UNIT/ML injection 20 units at bedtime, 7 units in the morning . Dose increase effective 08/02/2011 08/02/11  Yes Syliva Overman, MD  iron polysaccharides (NIFEREX) 150 MG capsule Take 150 mg by mouth 2 (two) times daily.     Yes Historical Provider, MD  LORTAB 10 10-500 MG per tablet TAKE 1 TABLET BY MOUTH 3 TIMES DAILY. 11/15/11  Yes Syliva Overman, MD  losartan (COZAAR) 100 MG tablet Take 1 tablet (100 mg total) by mouth daily. 05/30/11 05/29/12 Yes Syliva Overman, MD  Multiple Vitamin  (MULITIVITAMIN WITH MINERALS) TABS Take 1 tablet by mouth daily.     Yes Historical Provider, MD  nitrofurantoin (MACRODANTIN) 100 MG capsule Take 100 mg by mouth at bedtime.     Yes Historical Provider, MD  PHENERGAN 25 MG tablet TAKE 1 TABLET BY MOUTH   ONCE DAILY AS NEEDED FORNAUSEA 10/12/11  Yes Syliva Overman, MD  polyethylene glycol (MIRALAX / GLYCOLAX) packet Take 17 g by mouth as needed.     Yes Historical Provider, MD  potassium chloride (KLOR-CON) 20 MEQ packet Take 20 mEq by mouth 3 (three) times daily.     Yes Historical Provider, MD  PRILOSEC 20 MG capsule TAKE ONE CAPSULE BY MOUTH DAILY. 11/15/11  Yes Syliva Overman, MD  PROAIR HFA 108 (90 BASE) MCG/ACT inhaler INHALE 2 PUFFS BY MOUTH  EVERY 6 HOURS AS NEEDED . 06/29/11  Yes Syliva Overman, MD  Probiotic Product (ALIGN PO) Take 4 mg by mouth daily.     Yes Historical Provider, MD  pyridostigmine (MESTINON) 60 MG tablet Take 1 tablet (60 mg total) by mouth 3 (three) times daily. 10/16/11  Yes Syliva Overman, MD  rosuvastatin (CRESTOR) 20 MG tablet Take 1 tablet (20 mg total) by mouth daily. 09/30/11  Yes Syliva Overman, MD  temazepam (RESTORIL) 30 MG capsule Take 1 capsule (30 mg total) by mouth at bedtime as needed for sleep. 01/02/12 02/01/12 Yes Syliva Overman, MD  ZESTORETIC 20-12.5 MG per tablet TAKE (2) TABLETS BY MOUTHAT BEDTIME. 04/12/11  Yes Syliva Overman, MD  ACCU-CHEK AVIVA PLUS test strip USE TO TEST BLOOD SUGAR 3TIMES A DAY. 08/03/11   Syliva Overman, MD    No Known Allergies  History reviewed. No pertinent family history.  Social History  reports that she quit smoking about 8 years ago. She does not have any smokeless tobacco history on file. She reports that she does not drink alcohol or use illicit drugs.  Review Of Systems  11 points review of systems is negative with an exception of listed in HPI.  Physical Examination  BP 132/59  Pulse 62  Temp(Src) 98.7 F (37.1 C) (Oral)  Resp 18  Ht 5\' 6"   (1.676 m)  Wt 266 lb 1.5 oz (120.7 kg)  BMI 42.95 kg/m2  SpO2 94%  Neuro:  Sedated, intubated HEENT:  ETT/OGT Heart:  RRR, no M/R/G Lungs:  Bilateral air entry, no W/R/R Abdomen:  Soft, non tender, bowel sounds present Extremities:  Bowel sounds present  Labs  Results for orders placed during the hospital encounter of 01/05/12 (from the past 24 hour(s))  BLOOD GAS, ARTERIAL     Status: Abnormal   Collection Time   01/05/12  6:00 PM      Component Value  Range   FIO2 0.21     O2 Content 21.0     Delivery systems ROOM AIR     pH, Arterial 7.464 (*) 7.350 - 7.400    pCO2 arterial 35.9  35.0 - 45.0 (mmHg)   pO2, Arterial 72.8 (*) 80.0 - 100.0 (mmHg)   Bicarbonate 25.4 (*) 20.0 - 24.0 (mEq/L)   TCO2 23.5  0 - 100 (mmol/L)   Acid-Base Excess 2.0  0.0 - 2.0 (mmol/L)   O2 Saturation 96.0     Patient temperature 37.0     Collection site LEFT RADIAL     Drawn by [phone]     Sample type ARTERIAL     Allens test (pass/fail) PASS  PASS   CBC     Status: Abnormal   Collection Time   01/05/12  6:23 PM      Component Value Range   WBC 11.3 (*) 4.0 - 10.5 (K/uL)   RBC 3.47 (*) 3.87 - 5.11 (MIL/uL)   Hemoglobin 9.8 (*) 12.0 - 15.0 (g/dL)   HCT 21.3 (*) 08.6 - 46.0 (%)   MCV 88.5  78.0 - 100.0 (fL)   MCH 28.2  26.0 - 34.0 (pg)   MCHC 31.9  30.0 - 36.0 (g/dL)   RDW 57.8 (*) 46.9 - 15.5 (%)   Platelets 290  150 - 400 (K/uL)  DIFFERENTIAL     Status: Abnormal   Collection Time   01/05/12  6:23 PM      Component Value Range   Neutrophils Relative 78 (*) 43 - 77 (%)   Neutro Abs 8.8 (*) 1.7 - 7.7 (K/uL)   Lymphocytes Relative 15  12 - 46 (%)   Lymphs Abs 1.7  0.7 - 4.0 (K/uL)   Monocytes Relative 6  3 - 12 (%)   Monocytes Absolute 0.7  0.1 - 1.0 (K/uL)   Eosinophils Relative 1  0 - 5 (%)   Eosinophils Absolute 0.1  0.0 - 0.7 (K/uL)   Basophils Relative 0  0 - 1 (%)   Basophils Absolute 0.0  0.0 - 0.1 (K/uL)  BASIC METABOLIC PANEL     Status: Abnormal   Collection Time   01/05/12  6:23 PM        Component Value Range   Sodium 138  135 - 145 (mEq/L)   Potassium 3.9  3.5 - 5.1 (mEq/L)   Chloride 104  96 - 112 (mEq/L)   CO2 29  19 - 32 (mEq/L)   Glucose, Bld 67 (*) 70 - 99 (mg/dL)   BUN 10  6 - 23 (mg/dL)   Creatinine, Ser 6.29  0.50 - 1.10 (mg/dL)   Calcium 9.5  8.4 - 52.8 (mg/dL)   GFR calc non Af Amer 85 (*) >90 (mL/min)   GFR calc Af Amer >90  >90 (mL/min)  GLUCOSE, CAPILLARY     Status: Abnormal   Collection Time   01/05/12 11:12 PM      Component Value Range   Glucose-Capillary 44 (*) 70 - 99 (mg/dL)  GLUCOSE, CAPILLARY     Status: Abnormal   Collection Time   01/06/12 12:09 AM      Component Value Range   Glucose-Capillary 144 (*) 70 - 99 (mg/dL)    Imaging  Dg Chest 2 View  01/05/2012  *RADIOLOGY REPORT*  Clinical Data: Hemoptysis.  Ex-smoker.  CHEST - 2 VIEW  Comparison: 02/25/2011.  Findings: The cardiac silhouette remains borderline enlarged.  The left lung remains clear.  Interval patchy density in  the right lower lobe.  Stable mild prominence of the interstitial markings. The lungs remain mildly hyperexpanded.  Unremarkable bones.  IMPRESSION:  1.  Right lower lobe pneumonia or pulmonary hemorrhage. 2.  Stable mild changes of COPD and chronic bronchitis.  Original Report Authenticated By: Darrol Angel, M.D.   Ct Chest W Contrast  01/05/2012  *RADIOLOGY REPORT*  Clinical Data: 71 year old female with cough, hemoptysis and left chest pain.  CT CHEST WITH CONTRAST  Technique:  Multidetector CT imaging of the chest was performed following the standard protocol during bolus administration of intravenous contrast.  Contrast: 80mL OMNIPAQUE IOHEXOL 300 MG/ML IV SOLN  Comparison: 01/05/2012  Findings: Cardiomegaly and mild coronary artery calcifications are identified. There is no evidence of pleural or pericardial effusion. There is no evidence of thoracic aortic aneurysm. Shotty mediastinal lymph nodes are present.  Airspace opacities throughout the right lower lobe and  posterior inferior right upper lobe are noted. A smaller amount of airspace opacity within the left lower lobe is present.  Primary considerations include multifocal pneumonia and hemorrhage.  If this represents hemorrhage, the etiology/origin is not identified. There is no evidence of discrete mass or endobronchial/endotracheal lesion.  A moderate hiatal hernia is noted. No acute or suspicious bony abnormalities are identified.  IMPRESSION: Bilateral airspace opacities, greatest in the right lower lobe and posterior inferior right upper lobe.  Primary considerations include pneumonia and hemorrhage.  If this represents hemorrhage, the etiology/origin is not identified.  Consider bronchoscopy as clinically indicated.  Moderate hiatal hernia.  Cardiomegaly and coronary artery disease.  Original Report Authenticated By: Rosendo Gros, M.D.    Assessment  Hemoptysis (01/05/2012)  - Patient has hemoptysis since this morning. Had 3 episodes in past 24 hours. No hemoptysis since 5 pm today. Patient is also febrile with high WBC count and left shift. - CT chest revealed bilateral patchy opacity c/w multifocal pneumonia. Rt >Lt with air bronchogram. - Most likely this is pneumonia. - Cont. Current antibiotics. - Sputum and blood cultures. - Would recommend checking respiratory virus panel (nasal swab). - Would recommend check coag profile (PT/PTT/INR), Platelets within normal limits. - Although less likely, would recommend checking ANCA panel to rule out vasculitis (less likely in the setting of norma renal function).   Catha Brow, MD 01/06/2012, 2:01 AM

## 2012-01-06 NOTE — ED Notes (Signed)
carelink left with pt 

## 2012-01-06 NOTE — Significant Event (Signed)
CBG: 55  Treatment: 15 GM carbohydrate snack  Symptoms: None  Follow-up CBG: Time:0715 CBG Result:136  Possible Reasons for Event: Inadequate meal intake  Comments/MD notified:   Mercy Moore

## 2012-01-06 NOTE — Progress Notes (Signed)
Name: DINAH LUPA MRN: [id number] DOB: 11/15/1941  DOS:  01/05/2011    CRITICAL CARE MEDICINE ADMISSION / CONSULTATION NOTE  Referring Physician  Lenny Pastel  Reason For Consult  Hemoptysis  History Of Present Illness : The patient is seen in consultation at the request of Dr Claiborne Billings for evaluation of new onset hemoptysis.   The patient is a pleasant 71/F with DM, HTN who recently had viral URTI (3 weeks back). She presented with cough with hemoptysis starting this morning. As per the patient she has having cough for the last few days and this morning noticed blood tinged with sputum. She had 3 such episodes and each episode she coughs up 1 teaspoon full of sputum with blood. So far she had 15 ml of hemoptysis. She also complains of fever with chills and rigors. She denies any chest pain, joint pain, dysuria or hematuria. Not on any blood thinner or anticoagulants.  OVERNIGHT- No more blood. Describes cough with white/ yellow sputum that had just about cleared when heme began. Occasional baby ASA, no prior hemoptysis. Denies chest pain.    Past Medical History  Diagnosis Date  . Diabetes mellitus   . Hypertension   . Tobacco abuse   . Myasthenia gravis   . Arthritis   . Gastroesophageal reflux   . Thyroid disease   . Sleep apnea   . Iron deficiency anemia   . Depression   . Hyperlipidemia   . Obesity   . Carpal tunnel syndrome   . Shoulder pain   . Impingement syndrome, shoulder   . Pulmonary HTN   . Mitral regurgitation   . Schatzki's ring     Last EGD w/ dilation 02/08/11, 2009 & 2007  . Esophagitis, erosive 2009  . Diverticulosis 07/2004    Colonscopy Dr Jena Gauss    Past Surgical History  Procedure Date  . Rt tka 05/13/07    Dr. Romeo Apple  . Right carpal tunnel release 02/01/2009  . Incontinence surgery 08/26/09    Tananbaum  . Cholecystectomy   . Abdominal hysterectomy   . Eye surgery     Prior to Admission medications   Medication Sig Start Date End Date  Taking? Authorizing Provider  albuterol (PROVENTIL) (2.5 MG/3ML) 0.083% nebulizer solution Take 2.5 mg by nebulization every 6 (six) hours as needed.     Yes Historical Provider, MD  benazepril (LOTENSIN) 20 MG tablet Take 1 tablet (20 mg total) by mouth daily. 05/30/11 05/29/12 Yes Syliva Overman, MD  CELEXA 40 MG tablet TAKE ONE TABLET BY MOUTH DAILY. 10/16/11  Yes Syliva Overman, MD  CLARITIN 10 MG tablet TAKE ONE TABLET BY MOUTH DAILY. 08/15/11  Yes Syliva Overman, MD  clonazePAM (KLONOPIN) 0.5 MG tablet Take 1 tablet (0.5 mg total) by mouth 3 (three) times daily as needed for anxiety. 01/02/12 02/01/12 Yes Syliva Overman, MD  cycloSPORINE (RESTASIS) 0.05 % ophthalmic emulsion Place 1 drop into both eyes 2 (two) times daily.     Yes Historical Provider, MD  DELTASONE 5 MG tablet TAKE 1 TABLET BY MOUTH DAILY. 11/15/11  Yes Syliva Overman, MD  diltiazem (CARTIA XT) 240 MG 24 hr capsule Twice daily, dose increase effective 05/30/2011 05/30/11  Yes Syliva Overman, MD  DITROPAN XL 10 MG 24 hr tablet TAKE 1 TABLET BY MOUTH DAILY. 11/15/11  Yes Syliva Overman, MD  EFFEXOR XR 150 MG 24 hr capsule TAKE 1 CAPSULE BY MOUTH  DAILY. 10/16/11  Yes Syliva Overman, MD  furosemide (LASIX) 40 MG tablet Take 40  mg by mouth 2 (two) times daily.     Yes Historical Provider, MD  gabapentin (NEURONTIN) 300 MG capsule Take 1 capsule (300 mg total) by mouth 3 (three) times daily. 10/16/11  Yes Fuller Canada, MD  GLUCOVANCE 2.5-500 MG per tablet TAKE (2) TABLETS BY MOUTH TWICE DAILY. 11/15/11  Yes Syliva Overman, MD  insulin glargine (LANTUS) 100 UNIT/ML injection 20 units at bedtime, 7 units in the morning . Dose increase effective 08/02/2011 08/02/11  Yes Syliva Overman, MD  iron polysaccharides (NIFEREX) 150 MG capsule Take 150 mg by mouth 2 (two) times daily.     Yes Historical Provider, MD  LORTAB 10 10-500 MG per tablet TAKE 1 TABLET BY MOUTH 3 TIMES DAILY. 11/15/11  Yes Syliva Overman, MD  losartan  (COZAAR) 100 MG tablet Take 1 tablet (100 mg total) by mouth daily. 05/30/11 05/29/12 Yes Syliva Overman, MD  Multiple Vitamin (MULITIVITAMIN WITH MINERALS) TABS Take 1 tablet by mouth daily.     Yes Historical Provider, MD  nitrofurantoin (MACRODANTIN) 100 MG capsule Take 100 mg by mouth at bedtime.     Yes Historical Provider, MD  PHENERGAN 25 MG tablet TAKE 1 TABLET BY MOUTH   ONCE DAILY AS NEEDED FORNAUSEA 10/12/11  Yes Syliva Overman, MD  polyethylene glycol (MIRALAX / GLYCOLAX) packet Take 17 g by mouth as needed.     Yes Historical Provider, MD  potassium chloride (KLOR-CON) 20 MEQ packet Take 20 mEq by mouth 3 (three) times daily.     Yes Historical Provider, MD  PRILOSEC 20 MG capsule TAKE ONE CAPSULE BY MOUTH DAILY. 11/15/11  Yes Syliva Overman, MD  PROAIR HFA 108 (90 BASE) MCG/ACT inhaler INHALE 2 PUFFS BY MOUTH  EVERY 6 HOURS AS NEEDED . 06/29/11  Yes Syliva Overman, MD  Probiotic Product (ALIGN PO) Take 4 mg by mouth daily.     Yes Historical Provider, MD  pyridostigmine (MESTINON) 60 MG tablet Take 1 tablet (60 mg total) by mouth 3 (three) times daily. 10/16/11  Yes Syliva Overman, MD  rosuvastatin (CRESTOR) 20 MG tablet Take 1 tablet (20 mg total) by mouth daily. 09/30/11  Yes Syliva Overman, MD  temazepam (RESTORIL) 30 MG capsule Take 1 capsule (30 mg total) by mouth at bedtime as needed for sleep. 01/02/12 02/01/12 Yes Syliva Overman, MD  ZESTORETIC 20-12.5 MG per tablet TAKE (2) TABLETS BY MOUTHAT BEDTIME. 04/12/11  Yes Syliva Overman, MD  ACCU-CHEK AVIVA PLUS test strip USE TO TEST BLOOD SUGAR 3TIMES A DAY. 08/03/11   Syliva Overman, MD    No Known Allergies  History reviewed. No pertinent family history.  Social History  reports that she quit smoking about 8 years ago. She does not have any smokeless tobacco history on file. She reports that she does not drink alcohol or use illicit drugs.  Review Of Systems  11 points review of systems is negative with an exception  of listed in HPI.  Physical Examination-  BP 136/68  Pulse 97  Temp(Src) 99.1 F (37.3 C) (Oral)  Resp 18  Ht 5\' 6"  (1.676 m)  Wt 120.7 kg (266 lb 1.5 oz)  BMI 42.95 kg/m2  SpO2 90%  Neuro:  Drowsy, awake, oriented, cooperative HEENT:  Clear speech, no stridor Heart:  RRR, no M/R/G Lungs:  Coarse breath sounds bilateral bases, unlabored, no rub Abdomen:  Soft, non tender, bowel sounds present. Obese Extremities:  Moves all, neg Homan's  Labs  Results for orders placed during the hospital encounter of 01/05/12 (from the  past 24 hour(s))  BLOOD GAS, ARTERIAL     Status: Abnormal   Collection Time   01/05/12  6:00 PM      Component Value Range   FIO2 0.21     O2 Content 21.0     Delivery systems ROOM AIR     pH, Arterial 7.464 (*) 7.350 - 7.400    pCO2 arterial 35.9  35.0 - 45.0 (mmHg)   pO2, Arterial 72.8 (*) 80.0 - 100.0 (mmHg)   Bicarbonate 25.4 (*) 20.0 - 24.0 (mEq/L)   TCO2 23.5  0 - 100 (mmol/L)   Acid-Base Excess 2.0  0.0 - 2.0 (mmol/L)   O2 Saturation 96.0     Patient temperature 37.0     Collection site LEFT RADIAL     Drawn by [phone]     Sample type ARTERIAL     Allens test (pass/fail) PASS  PASS   CBC     Status: Abnormal   Collection Time   01/05/12  6:23 PM      Component Value Range   WBC 11.3 (*) 4.0 - 10.5 (K/uL)   RBC 3.47 (*) 3.87 - 5.11 (MIL/uL)   Hemoglobin 9.8 (*) 12.0 - 15.0 (g/dL)   HCT 57.3 (*) 22.0 - 46.0 (%)   MCV 88.5  78.0 - 100.0 (fL)   MCH 28.2  26.0 - 34.0 (pg)   MCHC 31.9  30.0 - 36.0 (g/dL)   RDW 25.4 (*) 27.0 - 15.5 (%)   Platelets 290  150 - 400 (K/uL)  DIFFERENTIAL     Status: Abnormal   Collection Time   01/05/12  6:23 PM      Component Value Range   Neutrophils Relative 78 (*) 43 - 77 (%)   Neutro Abs 8.8 (*) 1.7 - 7.7 (K/uL)   Lymphocytes Relative 15  12 - 46 (%)   Lymphs Abs 1.7  0.7 - 4.0 (K/uL)   Monocytes Relative 6  3 - 12 (%)   Monocytes Absolute 0.7  0.1 - 1.0 (K/uL)   Eosinophils Relative 1  0 - 5 (%)    Eosinophils Absolute 0.1  0.0 - 0.7 (K/uL)   Basophils Relative 0  0 - 1 (%)   Basophils Absolute 0.0  0.0 - 0.1 (K/uL)  BASIC METABOLIC PANEL     Status: Abnormal   Collection Time   01/05/12  6:23 PM      Component Value Range   Sodium 138  135 - 145 (mEq/L)   Potassium 3.9  3.5 - 5.1 (mEq/L)   Chloride 104  96 - 112 (mEq/L)   CO2 29  19 - 32 (mEq/L)   Glucose, Bld 67 (*) 70 - 99 (mg/dL)   BUN 10  6 - 23 (mg/dL)   Creatinine, Ser 6.23  0.50 - 1.10 (mg/dL)   Calcium 9.5  8.4 - 76.2 (mg/dL)   GFR calc non Af Amer 85 (*) >90 (mL/min)   GFR calc Af Amer >90  >90 (mL/min)  GLUCOSE, CAPILLARY     Status: Abnormal   Collection Time   01/05/12 11:12 PM      Component Value Range   Glucose-Capillary 44 (*) 70 - 99 (mg/dL)  GLUCOSE, CAPILLARY     Status: Abnormal   Collection Time   01/06/12 12:09 AM      Component Value Range   Glucose-Capillary 144 (*) 70 - 99 (mg/dL)  STREP PNEUMONIAE URINARY ANTIGEN     Status: Normal   Collection Time   01/06/12  2:03 AM      Component Value Range   Strep Pneumo Urinary Antigen NEGATIVE  NEGATIVE   BASIC METABOLIC PANEL     Status: Abnormal   Collection Time   01/06/12  5:25 AM      Component Value Range   Sodium 143  135 - 145 (mEq/L)   Potassium 4.0  3.5 - 5.1 (mEq/L)   Chloride 109  96 - 112 (mEq/L)   CO2 26  19 - 32 (mEq/L)   Glucose, Bld 47 (*) 70 - 99 (mg/dL)   BUN 7  6 - 23 (mg/dL)   Creatinine, Ser 1.61  0.50 - 1.10 (mg/dL)   Calcium 9.4  8.4 - 09.6 (mg/dL)   GFR calc non Af Amer 86 (*) >90 (mL/min)   GFR calc Af Amer >90  >90 (mL/min)  GLUCOSE, CAPILLARY     Status: Abnormal   Collection Time   01/06/12  6:46 AM      Component Value Range   Glucose-Capillary 55 (*) 70 - 99 (mg/dL)   Comment 1 Documented in Chart     Comment 2 Notify RN    GLUCOSE, CAPILLARY     Status: Abnormal   Collection Time   01/06/12  7:13 AM      Component Value Range   Glucose-Capillary 136 (*) 70 - 99 (mg/dL)    Imaging  Dg Chest 2 View  01/05/2012   *RADIOLOGY REPORT*  Clinical Data: Hemoptysis.  Ex-smoker.  CHEST - 2 VIEW  Comparison: 02/25/2011.  Findings: The cardiac silhouette remains borderline enlarged.  The left lung remains clear.  Interval patchy density in the right lower lobe.  Stable mild prominence of the interstitial markings. The lungs remain mildly hyperexpanded.  Unremarkable bones.  IMPRESSION:  1.  Right lower lobe pneumonia or pulmonary hemorrhage. 2.  Stable mild changes of COPD and chronic bronchitis.  Original Report Authenticated By: Darrol Angel, M.D.   Ct Chest W Contrast  01/05/2012  *RADIOLOGY REPORT*  Clinical Data: 71 year old female with cough, hemoptysis and left chest pain.  CT CHEST WITH CONTRAST  Technique:  Multidetector CT imaging of the chest was performed following the standard protocol during bolus administration of intravenous contrast.  Contrast: 80mL OMNIPAQUE IOHEXOL 300 MG/ML IV SOLN  Comparison: 01/05/2012  Findings: Cardiomegaly and mild coronary artery calcifications are identified. There is no evidence of pleural or pericardial effusion. There is no evidence of thoracic aortic aneurysm. Shotty mediastinal lymph nodes are present.  Airspace opacities throughout the right lower lobe and posterior inferior right upper lobe are noted. A smaller amount of airspace opacity within the left lower lobe is present.  Primary considerations include multifocal pneumonia and hemorrhage.  If this represents hemorrhage, the etiology/origin is not identified. There is no evidence of discrete mass or endobronchial/endotracheal lesion.  A moderate hiatal hernia is noted. No acute or suspicious bony abnormalities are identified.  IMPRESSION: Bilateral airspace opacities, greatest in the right lower lobe and posterior inferior right upper lobe.  Primary considerations include pneumonia and hemorrhage.  If this represents hemorrhage, the etiology/origin is not identified.  Consider bronchoscopy as clinically indicated.  Moderate  hiatal hernia.  Cardiomegaly and coronary artery disease.  Original Report Authenticated By: Rosendo Gros, M.D.    Assessment  Hemoptysis (01/05/2012) with pulmonary infiltrates  - Patient has hemoptysis 01/04/11. Had 3 episodes in  24 hours. No hemoptysis since 5 pm 01/04/11. Patient is also febrile with high WBC count and left shift. - CT chest  revealed bilateral patchy opacity c/w multifocal pneumonia. Rt >Lt with air bronchogram. - Images reviewed by me. - Most likely this is pneumonia.-   Consider aspiration. - Cont. Current antibiotics. - Sputum and blood cultures. - Would recommend checking respiratory virus panel (nasal swab). - Would recommend check coag profile (PT/PTT/INR), Platelets within normal limits. - Although less likely, would recommend checking ANCA panel to rule out vasculitis (less likely in the setting of norma renal function).   Waymon Budge, MD 01/06/2012, 9:32 AM

## 2012-01-06 NOTE — Progress Notes (Signed)
Subjective: Patient seen and examined, no hemoptysis since she came to [facility] from [facility].  Objective: Vital signs in last 24 hours: Temp:  [98.7 F (37.1 C)-100.1 F (37.8 C)] 99.1 F (37.3 C) (01/06 0705) Pulse Rate:  [60-97] 97  (01/06 0705) Resp:  [18-20] 18  (01/06 0705) BP: (121-167)/(50-90) 136/68 mmHg (01/06 0705) SpO2:  [90 %-96 %] 90 % (01/06 0705) Weight:  [117.935 kg (260 lb)-120.7 kg (266 lb 1.5 oz)] 266 lb 1.5 oz (120.7 kg) (01/06 0105) Weight change:  Last BM Date: 01/04/12  Intake/Output from previous day:       Physical Exam: General: Alert, awake, oriented x3, in no acute distress. HEENT: No bruits, no goiter. Heart: Regular rate and rhythm, without murmurs, rubs, gallops. Lungs: Clear to auscultation bilaterally. Abdomen: Soft, nontender, nondistended, positive bowel sounds. Extremities: No clubbing cyanosis or edema with positive pedal pulses.     Lab Results: Basic Metabolic Panel:  Basename 01/06/12 0525 01/05/12 1823  NA 143 138  K 4.0 3.9  CL 109 104  CO2 26 29  GLUCOSE 47* 67*  BUN 7 10  CREATININE 0.72 0.74  CALCIUM 9.4 9.5  MG -- --  PHOS -- --   Liver Function Tests: No results found for this basename: AST:2,ALT:2,ALKPHOS:2,BILITOT:2,PROT:2,ALBUMIN:2 in the last 72 hours No results found for this basename: LIPASE:2,AMYLASE:2 in the last 72 hours No results found for this basename: AMMONIA:2 in the last 72 hours CBC:  Basename 01/06/12 0851 01/05/12 1823  WBC -- 11.3*  NEUTROABS -- 8.8*  HGB 9.6* 9.8*  HCT 30.1* 30.7*  MCV -- 88.5  PLT -- 290   CBG:  Basename 01/06/12 0713 01/06/12 0646 01/06/12 0009 01/05/12 2312  GLUCAP 136* 55* 144* 44*    Studies/Results: Dg Chest 2 View  01/05/2012  *RADIOLOGY REPORT*  Clinical Data: Hemoptysis.  Ex-smoker.  CHEST - 2 VIEW  Comparison: 02/25/2011.  Findings: The cardiac silhouette remains borderline enlarged.  The left lung remains clear.  Interval patchy density in the right  lower lobe.  Stable mild prominence of the interstitial markings. The lungs remain mildly hyperexpanded.  Unremarkable bones.  IMPRESSION:  1.  Right lower lobe pneumonia or pulmonary hemorrhage. 2.  Stable mild changes of COPD and chronic bronchitis.  Original Report Authenticated By: Darrol Angel, M.D.   Ct Chest W Contrast  01/05/2012  *RADIOLOGY REPORT*  Clinical Data: 71 year old female with cough, hemoptysis and left chest pain.  CT CHEST WITH CONTRAST  Technique:  Multidetector CT imaging of the chest was performed following the standard protocol during bolus administration of intravenous contrast.  Contrast: 80mL OMNIPAQUE IOHEXOL 300 MG/ML IV SOLN  Comparison: 01/05/2012  Findings: Cardiomegaly and mild coronary artery calcifications are identified. There is no evidence of pleural or pericardial effusion. There is no evidence of thoracic aortic aneurysm. Shotty mediastinal lymph nodes are present.  Airspace opacities throughout the right lower lobe and posterior inferior right upper lobe are noted. A smaller amount of airspace opacity within the left lower lobe is present.  Primary considerations include multifocal pneumonia and hemorrhage.  If this represents hemorrhage, the etiology/origin is not identified. There is no evidence of discrete mass or endobronchial/endotracheal lesion.  A moderate hiatal hernia is noted. No acute or suspicious bony abnormalities are identified.  IMPRESSION: Bilateral airspace opacities, greatest in the right lower lobe and posterior inferior right upper lobe.  Primary considerations include pneumonia and hemorrhage.  If this represents hemorrhage, the etiology/origin is not identified.  Consider bronchoscopy  as clinically indicated.  Moderate hiatal hernia.  Cardiomegaly and coronary artery disease.  Original Report Authenticated By: Rosendo Gros, M.D.    Medications: Scheduled Meds:   . sodium chloride   Intravenous Once  . dextrose  50 mL Intravenous Once  .  dextrose      . insulin aspart  0-15 Units Subcutaneous TID WC  . moxifloxacin  400 mg Intravenous Once  . moxifloxacin  400 mg Intravenous Q24H  . sodium chloride  3 mL Intravenous Q12H   Continuous Infusions:  PRN Meds:.sodium chloride, albuterol, iohexol, sodium chloride  Assessment/Plan:   *Hemoptysis Most likely secondary to Pneumonia. Continue Avelox Pulmonary following  DIABETES MELLITUS Continue SSI   Anemia  H&H is stable at this time.  CAP (community acquired pneumonia) Continue Avelox.   LOS: 1 day   [facility] Pager: [phone] 01/06/2012, 10:42 AM

## 2012-01-06 NOTE — Progress Notes (Signed)
[facility] progress note for Dr. Sharl Ma. Chief complaint. Cough and hemoptysis. History of present illness. This 71 year old female seen at [facility] emergency room for complaints of hemoptysis. She was diagnosed for influenza approximately 3 weeks ago and had fully recovered. She came to the emergency room, because she is started having bloody sputum. She is to be seen by Dr. [name] of pulmonology who is already aware of her case per admission note. The decision was made to transfer her to [facility]. I am seeing her to ascertain her stability and insure her orders have carried over to here. Vital signs temperature 98.7, pulse 62, respiration 18, blood pressure 132/59. O2 sats 94%. General appearance. Obese elderly female in no distress. She is able to speak in full sentences. No dyspnea or hypoxia. Cardiac. Heart sounds distant. Rate and rhythm regular. No jugular venous distention. No significant edema. Lungs. Some mild rhonchi and a redo sounds in the right base. Otherwise fairly clear without distress or evidence of hemoptysis currently. Abdomen. Soft obese with positive bowel sounds. No pain. Impression/plan. Problem #1 community-acquired pneumonia. Patient currently on Avelox antibiotic therapy. Sputum and blood cultures are ordered. I will ask the staff to notify pulmonology of the patient's arrival so that she can be seen by them. I will add albuterol nebulizers as a when necessary. Problem #2 diabetes. Patient is on a cart modified diet. She has a sliding scale orders in place. Problem #3 anemia. There are hemoglobin checks as scheduled for every 8 hours. We'll follow for these results. I see no evidence of acute bleeding at this time.

## 2012-01-07 LAB — CBC
HCT: 30.7 % — ABNORMAL LOW (ref 36.0–46.0)
Hemoglobin: 9.6 g/dL — ABNORMAL LOW (ref 12.0–15.0)
MCV: 87.7 fL (ref 78.0–100.0)
Platelets: 275 10*3/uL (ref 150–400)
RBC: 3.5 MIL/uL — ABNORMAL LOW (ref 3.87–5.11)
WBC: 7.4 10*3/uL (ref 4.0–10.5)

## 2012-01-07 LAB — HEMOGLOBIN AND HEMATOCRIT, BLOOD: Hemoglobin: 10.2 g/dL — ABNORMAL LOW (ref 12.0–15.0)

## 2012-01-07 LAB — BASIC METABOLIC PANEL
BUN: 11 mg/dL (ref 6–23)
CO2: 26 mEq/L (ref 19–32)
Chloride: 104 mEq/L (ref 96–112)
Creatinine, Ser: 0.9 mg/dL (ref 0.50–1.10)
Glucose, Bld: 130 mg/dL — ABNORMAL HIGH (ref 70–99)

## 2012-01-07 LAB — GLUCOSE, CAPILLARY: Glucose-Capillary: 117 mg/dL — ABNORMAL HIGH (ref 70–99)

## 2012-01-07 MED ORDER — MOXIFLOXACIN HCL 400 MG PO TABS: 400.0000 mg | ORAL_TABLET | Freq: Every day | ORAL | Status: DC

## 2012-01-07 NOTE — Plan of Care (Signed)
Problem: Phase II Progression Outcomes Goal: Discharge plan established Outcome: Completed/Met Date Met:  01/07/12 Pt to be D/C to home

## 2012-01-07 NOTE — Progress Notes (Signed)
Agree with above 

## 2012-01-07 NOTE — Progress Notes (Signed)
Name: [name] MRN: [id number] DOB: June 18, 1941  DOS:  01/05/2011    CRITICAL CARE MEDICINE FU NOTE  Referring Physician  Lenny Pastel  Reason For Consult  Hemoptysis  History Of Present Illness : The patient is seen in consultation at the request of Lenny Pastel for evaluation of new onset hemoptysis.   The patient is a pleasant 70/F with DM, HTN who recently had viral URTI (3 weeks back). She presented with cough with hemoptysis starting this morning. As per the patient she has having cough for the last few days and this morning noticed blood tinged with sputum. She had 3 such episodes and each episode she coughs up 1 teaspoon full of sputum with blood. So far she had 15 ml of hemoptysis. She also complains of fever with chills and rigors. She denies any chest pain, joint pain, dysuria or hematuria. Not on any blood thinner or anticoagulants.  OVERNIGHT- No further bleeding or coughing.  "I feel much better"   Physical Exam  BP 126/62  Pulse 63  Temp(Src) 97.2 F (36.2 C) (Oral)  Resp 18  Ht 5\' 6"  (1.676 m)  Wt 266 lb 1.5 oz (120.7 kg)  BMI 42.95 kg/m2  SpO2 91%  Neuro: AAOx4, speech clear, MAE HEENT:  mm pink/moist, no stridor Heart:  RRR, no M/R/G Lungs:  Coarse breath sounds bilateral bases, unlabored, no rub Abdomen:  Soft, non tender, bowel sounds present. Obese Extremities:  Warm/dry, no edema  Labs  CBC    Component Value Date/Time   WBC 7.4 01/07/2012 0500   RBC 3.50* 01/07/2012 0500   HGB 10.2* 01/07/2012 0906   HCT 31.8* 01/07/2012 0906   PLT 275 01/07/2012 0500   MCV 87.7 01/07/2012 0500   MCH 27.4 01/07/2012 0500   MCHC 31.3 01/07/2012 0500   RDW 16.4* 01/07/2012 0500   LYMPHSABS 1.7 01/05/2012 1823   MONOABS 0.7 01/05/2012 1823   EOSABS 0.1 01/05/2012 1823   BASOSABS 0.0 01/05/2012 1823   BMET    Component Value Date/Time   NA 140 01/07/2012 0500   K 3.8 01/07/2012 0500   CL 104 01/07/2012 0500   CO2 26 01/07/2012 0500   GLUCOSE 130* 01/07/2012 0500   BUN 11 01/07/2012  0500   CREATININE 0.90 01/07/2012 0500   CREATININE 0.84 01/02/2012 1042   CALCIUM 8.9 01/07/2012 0500   GFRNONAA 64* 01/07/2012 0500   GFRAA 74* 01/07/2012 0500       Imaging  Dg Chest 2 View  01/05/2012  *RADIOLOGY REPORT*  Clinical Data: Hemoptysis.  Ex-smoker.  CHEST - 2 VIEW  Comparison: 02/25/2011.  Findings: The cardiac silhouette remains borderline enlarged.  The left lung remains clear.  Interval patchy density in the right lower lobe.  Stable mild prominence of the interstitial markings. The lungs remain mildly hyperexpanded.  Unremarkable bones.  IMPRESSION:  1.  Right lower lobe pneumonia or pulmonary hemorrhage. 2.  Stable mild changes of COPD and chronic bronchitis.  Original Report Authenticated By: Darrol Angel, M.D.   Ct Chest W Contrast  01/05/2012  *RADIOLOGY REPORT*  Clinical Data: 71 year old female with cough, hemoptysis and left chest pain.  CT CHEST WITH CONTRAST  Technique:  Multidetector CT imaging of the chest was performed following the standard protocol during bolus administration of intravenous contrast.  Contrast: 80mL OMNIPAQUE IOHEXOL 300 MG/ML IV SOLN  Comparison: 01/05/2012  Findings: Cardiomegaly and mild coronary artery calcifications are identified. There is no evidence of pleural or pericardial effusion. There is no evidence  of thoracic aortic aneurysm. Shotty mediastinal lymph nodes are present.  Airspace opacities throughout the right lower lobe and posterior inferior right upper lobe are noted. A smaller amount of airspace opacity within the left lower lobe is present.  Primary considerations include multifocal pneumonia and hemorrhage.  If this represents hemorrhage, the etiology/origin is not identified. There is no evidence of discrete mass or endobronchial/endotracheal lesion.  A moderate hiatal hernia is noted. No acute or suspicious bony abnormalities are identified.  IMPRESSION: Bilateral airspace opacities, greatest in the right lower lobe and posterior  inferior right upper lobe.  Primary considerations include pneumonia and hemorrhage.  If this represents hemorrhage, the etiology/origin is not identified.  Consider bronchoscopy as clinically indicated.  Moderate hiatal hernia.  Cardiomegaly and coronary artery disease.  Original Report Authenticated By: Rosendo Gros, M.D.    Assessment  Hemoptysis (01/05/2012) with pulmonary infiltrates  - Patient has hemoptysis 01/04/11. Had 3 episodes in 24 hours. No hemoptysis since 5 pm 01/04/11. Patient is now afebrile with resolving WBC - CT chest revealed bilateral patchy opacity c/w multifocal pneumonia. Rt >Lt with air bronchogram.  - Most likely this is pneumonia. - Can transition to PO avelox to complete 10 ds - Cultures negative to date 1/7 - Platelets within normal limits.  Patient requesting to go home.  Will defer to primary.  Likely hemoptysis related to airway irritation in setting of infiltrates.  Would complete abx, and ensure follow up with primary MD to review resolution of infiltrates as outpatient.   Canary Brim, NP-C [facility] Pgr: [phone]  Examined pt, above plan modified & discused with pt.  Judianne Seiple V.

## 2012-01-07 NOTE — Progress Notes (Signed)
Utilization Review Completed.  [name] T  01/07/2012 

## 2012-01-07 NOTE — Discharge Summary (Signed)
[name], 71 y.o., DOB [birth year], MRN [id number]. Admission date: 01/05/2012 Discharge Date 01/07/2012 Primary MD Syliva Overman, MD, MD Admitting Physician Haydee Monica  Admission Diagnosis  Pulmonary hemorrhage [786.30] Anxiety [300.00] Insomnia [780.52] CAP (community acquired pneumonia) [486] Coughing up blood, pain  Discharge Diagnosis   Principal Problem:  *Hemoptysis Active Problems:  DIABETES  Anemia of other chronic disease  CAP (community acquired pneumonia)      Past Medical History  Diagnosis Date  . Diabetes mellitus   . Hypertension   . Tobacco abuse   . Myasthenia gravis   . Arthritis   . Gastroesophageal reflux   . Thyroid disease   . Sleep apnea   . Iron deficiency anemia   . Depression   . Hyperlipidemia   . Obesity   . Carpal tunnel syndrome   . Shoulder pain   . Impingement syndrome, shoulder   . Pulmonary HTN   . Mitral regurgitation   . Schatzki's ring     Last EGD w/ dilation 02/08/11, 2009 & 2007  . Esophagitis, erosive 2009  . Diverticulosis 07/2004    Colonscopy Dr Jena Gauss    Past Surgical History  Procedure Date  . Rt tka 05/13/07    Dr. Romeo Apple  . Right carpal tunnel release 02/01/2009  . Incontinence surgery 08/26/09    Tananbaum  . Cholecystectomy   . Abdominal hysterectomy   . Eye surgery      Brief History and Physical 71 year old female with multiple medical problems who was diagnosed with influenza approximately 3 weeks ago and had a full recovery from that. She states earlier today she started having cough with some bloody sputum production. This has persisted throughout the day so she came to ER for further evaluation. She does have chronic anemia I don't know what her baseline hemoglobin is and she is anemic today with a hemoglobin of 9.8. Her chest x-ray and CAT scan of her chest shows questionable hemorrhage. She's not had any profuse bleeding from the lungs in the emergency department. She denies any recent fevers,  body aches or chills. She did not receive any antibiotics recently.   Hospital Course   Hemoptysis: Resolved, secondary to Pneumonia. Patient had no hemoptysis since 1/512. Patient had 3 episodes before coming to [facility]..    Pneumonia: Will d/c home on Po Avelox 10 mg po daily. Will f/u PCP in 10 days.  Diabetes mellitus: Continue glucovance.   Consults   Pulmonary  Significant Tests:  See full reports for all details    Dg Chest 2 View  01/05/2012  *RADIOLOGY REPORT*  Clinical Data: Hemoptysis.  Ex-smoker.  CHEST - 2 VIEW  Comparison: 02/25/2011.  Findings: The cardiac silhouette remains borderline enlarged.  The left lung remains clear.  Interval patchy density in the right lower lobe.  Stable mild prominence of the interstitial markings. The lungs remain mildly hyperexpanded.  Unremarkable bones.  IMPRESSION:  1.  Right lower lobe pneumonia or pulmonary hemorrhage. 2.  Stable mild changes of COPD and chronic bronchitis.  Original Report Authenticated By: Darrol Angel, M.D.   Ct Chest W Contrast  01/05/2012  *RADIOLOGY REPORT*  Clinical Data: 71 year old female with cough, hemoptysis and left chest pain.  CT CHEST WITH CONTRAST  Technique:  Multidetector CT imaging of the chest was performed following the standard protocol during bolus administration of intravenous contrast.  Contrast: 80mL OMNIPAQUE IOHEXOL 300 MG/ML IV SOLN  Comparison: 01/05/2012  Findings: Cardiomegaly and mild coronary artery calcifications are  identified. There is no evidence of pleural or pericardial effusion. There is no evidence of thoracic aortic aneurysm. Shotty mediastinal lymph nodes are present.  Airspace opacities throughout the right lower lobe and posterior inferior right upper lobe are noted. A smaller amount of airspace opacity within the left lower lobe is present.  Primary considerations include multifocal pneumonia and hemorrhage.  If this represents hemorrhage, the etiology/origin is not  identified. There is no evidence of discrete mass or endobronchial/endotracheal lesion.  A moderate hiatal hernia is noted. No acute or suspicious bony abnormalities are identified.  IMPRESSION: Bilateral airspace opacities, greatest in the right lower lobe and posterior inferior right upper lobe.  Primary considerations include pneumonia and hemorrhage.  If this represents hemorrhage, the etiology/origin is not identified.  Consider bronchoscopy as clinically indicated.  Moderate hiatal hernia.  Cardiomegaly and coronary artery disease.  Original Report Authenticated By: Rosendo Gros, M.D.     Today   Subjective:   [name] today has no headache,no chest abdominal pain.  Objective:   Blood pressure 126/62, pulse 63, temperature 97.2 F (36.2 C), temperature source Oral, resp. rate 18, height 5\' 6"  (1.676 m), weight 120.7 kg (266 lb 1.5 oz), SpO2 91.00%.  Intake/Output Summary (Last 24 hours) at 01/07/12 1340 Last data filed at 01/07/12 1308  Gross per 24 hour  Intake    970 ml  Output      0 ml  Net    970 ml    Exam Awake Alert, Oriented *3, No new F.N deficits, Normal affect [place].AT,PERRAL Supple Neck,No JVD, No cervical lymphadenopathy appriciated.  Symmetrical Chest wall movement, Good air movement bilaterally, CTAB RRR,No Gallops,Rubs or new Murmurs, No Parasternal Heave +ve B.Sounds, Abd Soft, Non tender, No organomegaly appriciated, No rebound -guarding or rigidity. No Cyanosis, Clubbing or edema, No new Rash or bruise  Data Review   Cultures -   CBC w Diff: Lab Results  Component Value Date   WBC 7.4 01/07/2012   HGB 10.2* 01/07/2012   HCT 31.8* 01/07/2012   PLT 275 01/07/2012   LYMPHOPCT 15 01/05/2012   MONOPCT 6 01/05/2012   EOSPCT 1 01/05/2012   BASOPCT 0 01/05/2012   CMP: Lab Results  Component Value Date   NA 140 01/07/2012   K 3.8 01/07/2012   CL 104 01/07/2012   CO2 26 01/07/2012   BUN 11 01/07/2012   CREATININE 0.90 01/07/2012   CREATININE 0.84 01/02/2012   PROT 6.5  01/02/2012   ALBUMIN 4.2 01/02/2012   BILITOT 0.3 01/02/2012   ALKPHOS 124* 01/02/2012   AST 15 01/02/2012   ALT 16 01/02/2012  .  Micro Results Recent Results (from the past 240 hour(s))  CULTURE, BLOOD (ROUTINE X 2)     Status: Normal (Preliminary result)   Collection Time   01/06/12  2:35 AM      Component Value Range Status Comment   Specimen Description BLOOD RIGHT FOREARM   Final    Special Requests     Final    Value: BOTTLES DRAWN AEROBIC AND ANAEROBIC 5CC EA PATIENT ON FOLLOWING AVELOX   Setup Time [id number]   Final    Culture     Final    Value:        BLOOD CULTURE RECEIVED NO GROWTH TO DATE CULTURE WILL BE HELD FOR 5 DAYS BEFORE ISSUING A FINAL NEGATIVE REPORT   Report Status PENDING   Incomplete   CULTURE, BLOOD (ROUTINE X 2)     Status: Normal (Preliminary  result)   Collection Time   01/06/12  5:25 AM      Component Value Range Status Comment   Specimen Description BLOOD RIGHT HAND   Final    Special Requests BOTTLES DRAWN AEROBIC AND ANAEROBIC 3CC EA   Final    Setup Time [id number]   Final    Culture     Final    Value:        BLOOD CULTURE RECEIVED NO GROWTH TO DATE CULTURE WILL BE HELD FOR 5 DAYS BEFORE ISSUING A FINAL NEGATIVE REPORT   Report Status PENDING   Incomplete      Discharge Instructions      Follow-up Information    Follow up with Syliva Overman, MD in 10 days.         Discharge Medications   Medication List  As of 01/07/2012  1:40 PM   START taking these medications         moxifloxacin 400 MG tablet   Commonly known as: AVELOX   Take 1 tablet (400 mg total) by mouth daily.         CONTINUE taking these medications         ACCU-CHEK AVIVA PLUS test strip   Generic drug: glucose blood   USE TO TEST BLOOD SUGAR 3TIMES A DAY.      * albuterol (2.5 MG/3ML) 0.083% nebulizer solution   Commonly known as: PROVENTIL      * PROAIR HFA 108 (90 BASE) MCG/ACT inhaler   Generic drug: albuterol   INHALE 2 PUFFS BY MOUTH  EVERY 6 HOURS AS NEEDED .       ALIGN PO      benazepril 20 MG tablet   Commonly known as: LOTENSIN   Take 1 tablet (20 mg total) by mouth daily.      CELEXA 40 MG tablet   Generic drug: citalopram   TAKE ONE TABLET BY MOUTH DAILY.      CLARITIN 10 MG tablet   Generic drug: loratadine   TAKE ONE TABLET BY MOUTH DAILY.      clonazePAM 0.5 MG tablet   Commonly known as: KLONOPIN   Take 1 tablet (0.5 mg total) by mouth 3 (three) times daily as needed for anxiety.      cycloSPORINE 0.05 % ophthalmic emulsion   Commonly known as: RESTASIS      DELTASONE 5 MG tablet   Generic drug: predniSONE   TAKE 1 TABLET BY MOUTH DAILY.      diltiazem 240 MG 24 hr capsule   Commonly known as: CARDIZEM CD   Twice daily, dose increase effective 05/30/2011      DITROPAN XL 10 MG 24 hr tablet   Generic drug: oxybutynin   TAKE 1 TABLET BY MOUTH DAILY.      EFFEXOR XR 150 MG 24 hr capsule   Generic drug: venlafaxine   TAKE 1 CAPSULE BY MOUTH  DAILY.      furosemide 40 MG tablet   Commonly known as: LASIX      gabapentin 300 MG capsule   Commonly known as: NEURONTIN   Take 1 capsule (300 mg total) by mouth 3 (three) times daily.      GLUCOVANCE 2.5-500 MG per tablet   Generic drug: glyBURIDE-metformin   TAKE (2) TABLETS BY MOUTH TWICE DAILY.      insulin glargine 100 UNIT/ML injection   Commonly known as: LANTUS   20 units at bedtime, 7 units in the morning . Dose increase  effective 08/02/2011      iron polysaccharides 150 MG capsule   Commonly known as: NIFEREX      LORTAB 10 10-500 MG per tablet   Generic drug: HYDROcodone-acetaminophen   TAKE 1 TABLET BY MOUTH 3 TIMES DAILY.      losartan 100 MG tablet   Commonly known as: COZAAR   Take 1 tablet (100 mg total) by mouth daily.      mulitivitamin with minerals Tabs      nitrofurantoin 100 MG capsule   Commonly known as: MACRODANTIN      PHENERGAN 25 MG tablet   Generic drug: promethazine   TAKE 1 TABLET BY MOUTH   ONCE DAILY AS NEEDED FORNAUSEA       polyethylene glycol packet   Commonly known as: MIRALAX / GLYCOLAX      potassium chloride 20 MEQ packet   Commonly known as: KLOR-CON      PRILOSEC 20 MG capsule   Generic drug: omeprazole   TAKE ONE CAPSULE BY MOUTH DAILY.      pyridostigmine 60 MG tablet   Commonly known as: MESTINON   Take 1 tablet (60 mg total) by mouth 3 (three) times daily.      rosuvastatin 20 MG tablet   Commonly known as: CRESTOR   Take 1 tablet (20 mg total) by mouth daily.      temazepam 30 MG capsule   Commonly known as: RESTORIL   Take 1 capsule (30 mg total) by mouth at bedtime as needed for sleep.      ZESTORETIC 20-12.5 MG per tablet   Generic drug: lisinopril-hydrochlorothiazide   TAKE (2) TABLETS BY MOUTHAT BEDTIME.     * Notice: This list has 2 medication(s) that are the same as other medications prescribed for you. Read the directions carefully, and ask your doctor or other care provider to review them with you.        Where to get your medications    These are the prescriptions that you need to pick up.   You may get these medications from any pharmacy.         moxifloxacin 400 MG tablet             Total Time in preparing paper work, data evaluation and todays exam - 35 minutes  LAMA,GAGAN S M.D on 01/07/2012 at 1:40 PM  [facility] Office  [phone]

## 2012-01-11 ENCOUNTER — Other Ambulatory Visit: Payer: Self-pay | Admitting: Family Medicine

## 2012-01-17 ENCOUNTER — Ambulatory Visit (INDEPENDENT_AMBULATORY_CARE_PROVIDER_SITE_OTHER): Payer: Medicare Other | Admitting: Family Medicine

## 2012-01-17 ENCOUNTER — Encounter: Payer: Self-pay | Admitting: Family Medicine

## 2012-01-17 VITALS — BP 136/58 | HR 63 | Resp 18 | Ht 66.0 in | Wt 276.1 lb

## 2012-01-17 DIAGNOSIS — D649 Anemia, unspecified: Secondary | ICD-10-CM

## 2012-01-17 DIAGNOSIS — J189 Pneumonia, unspecified organism: Secondary | ICD-10-CM

## 2012-01-17 NOTE — Patient Instructions (Signed)
Please get your Chest X-ray in 2 weeks Get your blood drawn in 2 weeks to check your hemoglobin Continue your other medications F/ U in Feb with Dr. Lodema Hong as scheduled

## 2012-01-17 NOTE — Assessment & Plan Note (Signed)
Patient is currently on iron therapy. She has anemia which is mixed iron deficiency and chronic disease. Will obtain CBC in approximately 2 weeks and make sure she is returned to her baseline. She is no evidence of acute bleed at this time.

## 2012-01-17 NOTE — Assessment & Plan Note (Signed)
Status post course of Avelox for pneumonia associated with hemoptysis. Will obtain repeat chest x-ray in approximately 2 weeks to ensure resolution of infiltrate.

## 2012-01-17 NOTE — Progress Notes (Signed)
  Subjective:    Patient ID: [name], female    DOB: June 10, 1941, 71 y.o.   MRN: [id number]  HPI Patient here to followup hospital admission. She was admitted for 24 hours after acute episodes of hemoptysis. She was seen by pulmonary disability secondary to acute pneumonia. She was treated with antibiotics twice a day for 24 hours then sent home with oral medication which she has completed. She denies any current cough, shortness of breath or chest pain. Overall feels well.  Hospital discharge labs were reviewed. Patient was anemic she has history of iron deficiency anemia however hemoglobin was lower than baseline.   Review of Systems   GEN- denies fatigue, fever, weight loss,weakness, + recent illness HEENT- denies eye drainage, change in vision, nasal discharge, CVS- denies chest pain, palpitations RESP- denies SOB, cough, wheeze ABD- denies N/V, change in stools, abd pain GU- denies dysuria, hematuria,  MSK- + joint pain,denies muscle aches, injury        Objective:   Physical Exam GEN- NAD, alert and oriented x3, obese, pleasant HEENT- , MMM, oropharynx clear Neck- Supple, no LAD CVS- RRR, 2/6 SEM  RESP-CTAB, normal WOB EXT-1+ edema Pulses- Radial, DP- 2+        Assessment & Plan:

## 2012-02-04 ENCOUNTER — Other Ambulatory Visit: Payer: Self-pay | Admitting: Family Medicine

## 2012-02-11 ENCOUNTER — Ambulatory Visit (HOSPITAL_COMMUNITY)
Admission: RE | Admit: 2012-02-11 | Discharge: 2012-02-11 | Disposition: A | Discharge status: Home / Self Care | Payer: Medicare Other | Source: Ambulatory Visit | Attending: Family Medicine | Admitting: Family Medicine

## 2012-02-11 ENCOUNTER — Other Ambulatory Visit: Payer: Self-pay | Admitting: Family Medicine

## 2012-02-11 DIAGNOSIS — J189 Pneumonia, unspecified organism: Secondary | ICD-10-CM

## 2012-02-11 DIAGNOSIS — Z139 Encounter for screening, unspecified: Secondary | ICD-10-CM

## 2012-02-11 DIAGNOSIS — Z09 Encounter for follow-up examination after completed treatment for conditions other than malignant neoplasm: Secondary | ICD-10-CM | POA: Insufficient documentation

## 2012-02-12 ENCOUNTER — Other Ambulatory Visit: Payer: Self-pay | Admitting: Family Medicine

## 2012-02-12 LAB — CBC
MCV: 89.1 fL (ref 78.0–100.0)
Platelets: 327 10*3/uL (ref 150–400)
RDW: 16.3 % — ABNORMAL HIGH (ref 11.5–15.5)
WBC: 9.4 10*3/uL (ref 4.0–10.5)

## 2012-02-13 ENCOUNTER — Ambulatory Visit (INDEPENDENT_AMBULATORY_CARE_PROVIDER_SITE_OTHER): Payer: Medicare Other | Admitting: Family Medicine

## 2012-02-13 ENCOUNTER — Encounter: Payer: Self-pay | Admitting: Family Medicine

## 2012-02-13 VITALS — BP 160/76 | HR 79 | Resp 16 | Ht 66.0 in | Wt 263.0 lb

## 2012-02-13 DIAGNOSIS — E119 Type 2 diabetes mellitus without complications: Secondary | ICD-10-CM

## 2012-02-13 DIAGNOSIS — E669 Obesity, unspecified: Secondary | ICD-10-CM

## 2012-02-13 DIAGNOSIS — J189 Pneumonia, unspecified organism: Secondary | ICD-10-CM

## 2012-02-13 DIAGNOSIS — I10 Essential (primary) hypertension: Secondary | ICD-10-CM

## 2012-02-13 DIAGNOSIS — R9389 Abnormal findings on diagnostic imaging of other specified body structures: Secondary | ICD-10-CM

## 2012-02-13 DIAGNOSIS — E785 Hyperlipidemia, unspecified: Secondary | ICD-10-CM

## 2012-02-13 DIAGNOSIS — F329 Major depressive disorder, single episode, unspecified: Secondary | ICD-10-CM

## 2012-02-13 NOTE — Patient Instructions (Addendum)
First week in April.  Please increase morning lantus to 10 units  .Goal for fasting blood sugar ranges from 80 to 120 and 2 hours after any meal or at bedtime should be between 130 to 170. Fasting cmp and Egfr, hBA1C in first week in April  You are referred for a rept chest CT scan in mid March  It is important that you exercise regularly at least 30 minutes 5 times a week. If you develop chest pain, have severe difficulty breathing, or feel very tired, stop exercising immediately and seek medical attention    A healthy diet is rich in fruit, vegetables and whole grains. Poultry fish, nuts and beans are a healthy choice for protein rather then red meat. A low sodium diet and drinking 64 ounces of water daily is generally recommended. Oils and sweet should be limited. Carbohydrates especially for those who are diabetic or overweight, should be limited to 30-45 gram per meal. It is important to eat on a regular schedule, at least 3 times daily. Snacks should be primarily fruits, vegetables or nuts.

## 2012-02-25 ENCOUNTER — Ambulatory Visit (HOSPITAL_COMMUNITY)
Admission: RE | Admit: 2012-02-25 | Discharge: 2012-02-25 | Disposition: A | Discharge status: Home / Self Care | Payer: Medicare Other | Source: Ambulatory Visit | Attending: Family Medicine | Admitting: Family Medicine

## 2012-02-25 ENCOUNTER — Ambulatory Visit (HOSPITAL_COMMUNITY): Payer: Medicare Other

## 2012-02-25 DIAGNOSIS — Z139 Encounter for screening, unspecified: Secondary | ICD-10-CM

## 2012-02-25 DIAGNOSIS — Z1231 Encounter for screening mammogram for malignant neoplasm of breast: Secondary | ICD-10-CM | POA: Insufficient documentation

## 2012-03-02 DIAGNOSIS — R9389 Abnormal findings on diagnostic imaging of other specified body structures: Secondary | ICD-10-CM | POA: Insufficient documentation

## 2012-03-02 NOTE — Progress Notes (Signed)
  Subjective:    Patient ID: [name], female    DOB: 1941-10-26, 71 y.o.   MRN: [id number]  HPI The PT is here for follow up and re-evaluation of chronic medical conditions, medication management and review of any available recent lab and radiology data.  Preventive health is updated, specifically  Cancer screening and Immunization.   Questions or concerns regarding consultations or procedures which the PT has had in the interim are  Addressed.She unfortunately required hospitalization for hemoptysis for CAP, acute presentation, states she is regaining her strength The PT denies any adverse reactions to current medications since the last visit.  Blood sugars remain higher than goal range when checked with fasting sugars seldom under 150     Review of Systems See HPI Denies recent fever or chills. Denies sinus pressure, nasal congestion, ear pain or sore throat. Denies chest congestion, productive cough or wheezing. Denies chest pains, palpitations and leg swelling Denies abdominal pain, nausea, vomiting,diarrhea or constipation.   Denies dysuria, frequency, hesitancy or incontinence. Chronic joint pain, with  limitation in mobility. Denies headaches, seizures, numbness, or tingling. Denies uncontrolled  depression, anxiety or insomnia. Denies skin break down or rash.        Objective:   Physical Exam  Patient alert and oriented and in no cardiopulmonary distress.  HEENT: No facial asymmetry, EOMI, no sinus tenderness,  oropharynx pink and moist.  Neck supple no adenopathy.  Chest: Clear to auscultation bilaterally.  CVS: S1, S2 no murmurs, no S3.  ABD: Soft non tender. Bowel sounds normal.  Ext: No edema  MS: decreased  ROM spine, shoulders, hips and knees.  Skin: Intact, no ulcerations or rash noted.  Psych: Good eye contact, normal affect. Memory intact not anxious or depressed appearing.  CNS: CN 2-12 intact, power, tone and sensation normal  throughout.       Assessment & Plan:

## 2012-03-02 NOTE — Assessment & Plan Note (Signed)
Improved, no change in medication 

## 2012-03-02 NOTE — Assessment & Plan Note (Signed)
Unchanged. Patient re-educated about  the importance of commitment to a  minimum of 150 minutes of exercise per week. The importance of healthy food choices with portion control discussed. Encouraged to start a food diary, count calories and to consider  joining a support group. Sample diet sheets offered. Goals set by the patient for the next several months.    

## 2012-03-02 NOTE — Assessment & Plan Note (Addendum)
Elevated at this visit, pt reports not having taken her medication as prescribed

## 2012-03-02 NOTE — Assessment & Plan Note (Signed)
Pt to increase lantus dose and be diligent with treatment plan, she is to call if blood sugars remain elevated

## 2012-03-02 NOTE — Assessment & Plan Note (Signed)
Pt presente 0n 02/05 with hemoptysis, CXR did not show the bilateral disease seen on CT scan , hence a re[pt scan is warranted, will request by mid March

## 2012-03-04 ENCOUNTER — Telehealth: Payer: Self-pay | Admitting: Family Medicine

## 2012-03-06 NOTE — Telephone Encounter (Signed)
It has been scheduled and patient is aware

## 2012-03-13 ENCOUNTER — Ambulatory Visit (HOSPITAL_COMMUNITY)
Admission: RE | Admit: 2012-03-13 | Discharge: 2012-03-13 | Disposition: A | Discharge status: Home / Self Care | Payer: Medicare Other | Source: Ambulatory Visit | Attending: Family Medicine | Admitting: Family Medicine

## 2012-03-13 DIAGNOSIS — R9389 Abnormal findings on diagnostic imaging of other specified body structures: Secondary | ICD-10-CM

## 2012-03-13 DIAGNOSIS — R042 Hemoptysis: Secondary | ICD-10-CM | POA: Insufficient documentation

## 2012-03-13 DIAGNOSIS — R918 Other nonspecific abnormal finding of lung field: Secondary | ICD-10-CM | POA: Insufficient documentation

## 2012-03-13 LAB — CREATININE, SERUM
Creatinine, Ser: 0.8 mg/dL (ref 0.50–1.10)
GFR calc non Af Amer: 74 mL/min — ABNORMAL LOW (ref >90)

## 2012-03-13 MED ORDER — IOHEXOL 300 MG/ML  SOLN
80.0000 mL | Freq: Once | INTRAMUSCULAR | Status: AC | PRN
  Administered 2012-03-13: 80 mL via INTRAVENOUS

## 2012-03-14 LAB — COMPLETE METABOLIC PANEL WITH GFR
AST: 16 U/L (ref 0–37)
BUN: 13 mg/dL (ref 6–23)
Calcium: 9.4 mg/dL (ref 8.4–10.5)
Chloride: 104 mEq/L (ref 96–112)
Creat: 0.81 mg/dL (ref 0.50–1.10)

## 2012-03-14 LAB — LIPID PANEL: HDL: 63 mg/dL (ref >39)

## 2012-03-14 LAB — HEMOGLOBIN A1C
Hgb A1c MFr Bld: 7.2 % — ABNORMAL HIGH (ref <5.7)
Mean Plasma Glucose: 160 mg/dL — ABNORMAL HIGH (ref <117)

## 2012-04-03 ENCOUNTER — Encounter: Payer: Self-pay | Admitting: Family Medicine

## 2012-04-03 ENCOUNTER — Telehealth: Payer: Self-pay | Admitting: Internal Medicine

## 2012-04-03 ENCOUNTER — Ambulatory Visit (INDEPENDENT_AMBULATORY_CARE_PROVIDER_SITE_OTHER): Payer: Medicare Other | Admitting: Family Medicine

## 2012-04-03 VITALS — BP 152/74 | HR 76 | Resp 18 | Ht 66.0 in | Wt 268.1 lb

## 2012-04-03 DIAGNOSIS — N3090 Cystitis, unspecified without hematuria: Secondary | ICD-10-CM

## 2012-04-03 DIAGNOSIS — Q393 Congenital stenosis and stricture of esophagus: Secondary | ICD-10-CM

## 2012-04-03 DIAGNOSIS — R1310 Dysphagia, unspecified: Secondary | ICD-10-CM | POA: Insufficient documentation

## 2012-04-03 DIAGNOSIS — E785 Hyperlipidemia, unspecified: Secondary | ICD-10-CM

## 2012-04-03 DIAGNOSIS — K222 Esophageal obstruction: Secondary | ICD-10-CM

## 2012-04-03 DIAGNOSIS — G4730 Sleep apnea, unspecified: Secondary | ICD-10-CM

## 2012-04-03 DIAGNOSIS — J189 Pneumonia, unspecified organism: Secondary | ICD-10-CM

## 2012-04-03 DIAGNOSIS — E1065 Type 1 diabetes mellitus with hyperglycemia: Secondary | ICD-10-CM

## 2012-04-03 DIAGNOSIS — N3000 Acute cystitis without hematuria: Secondary | ICD-10-CM

## 2012-04-03 DIAGNOSIS — I10 Essential (primary) hypertension: Secondary | ICD-10-CM

## 2012-04-03 LAB — POCT URINALYSIS DIPSTICK
Blood, UA: NEGATIVE
Glucose, UA: NEGATIVE
Ketones, UA: NEGATIVE
Protein, UA: 100
Spec Grav, UA: 1.02

## 2012-04-03 MED ORDER — DILTIAZEM HCL ER COATED BEADS 240 MG PO CP24: ORAL_CAPSULE | ORAL | Status: DC

## 2012-04-03 MED ORDER — AMLODIPINE BESYLATE 5 MG PO TABS: 5.0000 mg | ORAL_TABLET | Freq: Every day | ORAL | Status: DC

## 2012-04-03 NOTE — Telephone Encounter (Signed)
Left message on machine for pt to call me back to set up OV. She is a referral from Dr Lodema Hong.

## 2012-04-03 NOTE — Patient Instructions (Addendum)
F/u in 7 weeks.  Additional medication for blood pressure, amlodipine 5mg  one daily.   You are being referred for sleep apnea re evaluation.   You are being referred to Dr Jena Gauss since you are having difficulty swallowing.  Congrats on improved blood sugars, Increase bedtime lantus to 25 units , take lantus only at bedtime please  Goal for fasting blood sugar ranges from 80 to 120 and 2 hours after any meal or at bedtime should be between 130 to 170.

## 2012-04-03 NOTE — Progress Notes (Signed)
  Subjective:    Patient ID: [name], female    DOB: 07-Sep-1941, 71 y.o.   MRN: [id number]  HPI The PT is here for follow up and re-evaluation of chronic medical conditions, medication management and review of any available recent lab and radiology data.  Preventive health is updated, specifically  Cancer screening and Immunization.   Questions or concerns regarding consultations or procedures which the PT has had in the interim are  addressed. The PT denies any adverse reactions to current medications since the last visit.  C/o increased dysphagia, has had  dilatation in the past. C/o urinary frequency with mild dysuria x 2 days Reports generally improved blood sugars with fasting sugars between 140 to 150    Review of Systems See HPI Denies recent fever or chills. Denies sinus pressure, nasal congestion, ear pain or sore throat. Denies chest congestion, productive cough or wheezing. Denies chest pains, palpitations and leg swelling Denies abdominal pain, nausea, vomiting,diarrhea or constipation.    Chronic  joint pain, and limitation in mobility. Denies headaches, seizures, numbness, or tingling. Denies depression, anxiety or insomnia. Denies skin break down or rash.        Objective:   Physical Exam Patient alert and oriented and in no cardiopulmonary distress.  HEENT: No facial asymmetry, EOMI, no sinus tenderness,  oropharynx pink and moist.  Neck supple no adenopathy.  Chest: Clear to auscultation bilaterally.  CVS: S1, S2 no murmurs, no S3.  ABD: Soft non tender. Bowel sounds normal.  Ext: No edema  QM:VHQIONGEX  ROM spine, shoulders, hips and knees.  Skin: Intact, no ulcerations or rash noted.  Psych: Good eye contact, normal affect. Memory intact not anxious or depressed appearing.  CNS: CN 2-12 intact, power, tone and sensation normal throughout.        Assessment & Plan:

## 2012-04-06 DIAGNOSIS — N3000 Acute cystitis without hematuria: Secondary | ICD-10-CM | POA: Insufficient documentation

## 2012-04-06 NOTE — Assessment & Plan Note (Signed)
Improved but still not at goal, dose inc on lantus

## 2012-04-06 NOTE — Assessment & Plan Note (Signed)
Non compliant with CPAP, chest CT reports pulmonary hTN, importance of compliance stressed, refer back to sleep specialist

## 2012-04-06 NOTE — Assessment & Plan Note (Addendum)
almost at goal, no med change, low fat diet discussed and encouraged

## 2012-04-06 NOTE — Assessment & Plan Note (Signed)
Symptomatic with abnormal CCUA, will f/u on c/s, no antibiotic prescribed

## 2012-04-06 NOTE — Assessment & Plan Note (Signed)
Increased dysphagia, refer to GI

## 2012-04-06 NOTE — Assessment & Plan Note (Signed)
Uncontrolled, increase med dose 

## 2012-04-06 NOTE — Assessment & Plan Note (Signed)
rept chest Ct normal

## 2012-04-07 ENCOUNTER — Encounter: Payer: Self-pay | Admitting: Internal Medicine

## 2012-04-07 ENCOUNTER — Other Ambulatory Visit: Payer: Self-pay | Admitting: Family Medicine

## 2012-04-07 LAB — URINE CULTURE: Colony Count: >=100000

## 2012-04-07 MED ORDER — CIPROFLOXACIN HCL 500 MG PO TABS
500.0000 mg | ORAL_TABLET | Freq: Two times a day (BID) | ORAL | Status: AC
Start: 2012-04-07 — End: 2012-04-17

## 2012-04-08 ENCOUNTER — Ambulatory Visit (INDEPENDENT_AMBULATORY_CARE_PROVIDER_SITE_OTHER): Payer: Medicare Other | Admitting: Urgent Care

## 2012-04-08 ENCOUNTER — Encounter: Payer: Self-pay | Admitting: Urgent Care

## 2012-04-08 VITALS — BP 140/80 | HR 88 | Temp 97.0°F | Ht 66.0 in | Wt 268.4 lb

## 2012-04-08 DIAGNOSIS — K5900 Constipation, unspecified: Secondary | ICD-10-CM

## 2012-04-08 DIAGNOSIS — R1310 Dysphagia, unspecified: Secondary | ICD-10-CM

## 2012-04-08 DIAGNOSIS — K219 Gastro-esophageal reflux disease without esophagitis: Secondary | ICD-10-CM

## 2012-04-08 DIAGNOSIS — D649 Anemia, unspecified: Secondary | ICD-10-CM

## 2012-04-08 DIAGNOSIS — D638 Anemia in other chronic diseases classified elsewhere: Secondary | ICD-10-CM

## 2012-04-08 NOTE — Assessment & Plan Note (Signed)
Suspect chronic mixed picture w/ anemia of chronic disease & iron deficiency anemia.  She has had a significant drop in hgb of 2 grams over the past year & continues po iron without any change in hgb.  ? Problems w/ po iron absorption.  Hospitalization for CAP w/ hemoptysis but no further episodes or gross GI bleeding.  Given significant change, her anemia should be re-evaluated.  Last anemia panel was many yrs ago.  Anemia panel Hemoccult stools x3

## 2012-04-08 NOTE — Progress Notes (Signed)
Referring Provider: Kerri Perches, MD Primary Care Physician:  Syliva Overman, MD, MD Primary Gastroenterologist:  Dr. Jena Gauss  Chief Complaint  Patient presents with  . Dysphagia    x 1 month    HPI:  [name] is a 71 y.o. female here for follow up for recurrent dysphagia in the setting of chronic GERD, Schatzki's ring & myasthenia gravis.  Last esophageal dilation Feb 2012.  1 month ago she developed recurrent dysphagia, especially breads & meats.  She was eating Chili beans 3 days ago & vomited 3 hrs after eating.  C/o burning in epigastric area.  Taking Omeprazole 20mg  qam.  Occaional regurgitation & choking spells w/ liquids including water.  Pills getting stuck too retrosternally.  C/o breakthrough heartburn & indigestion on omeprazole.  C/o Constipation but miralax is really helping.  Takes iron BID for chronic IDA.  Hgb has dropped from 11.2 in Feb 2012 to 9-range this yr.  She was admitted w/ small volume hemoptysis earlier this yr felt to be secondary to CAP.  Wt down 10# w/ walking.  She denies any rectal bleeding or melena.   Last colonoscopy 2005.   Past Medical History  Diagnosis Date  . Diabetes mellitus   . Hypertension   . Tobacco abuse   . Myasthenia gravis   . Arthritis   . Gastroesophageal reflux   . Thyroid disease   . Sleep apnea   . Iron deficiency anemia   . Depression   . Hyperlipidemia   . Obesity   . Carpal tunnel syndrome   . Shoulder pain   . Impingement syndrome, shoulder   . Pulmonary HTN   . Mitral regurgitation   . Schatzki's ring     Last EGD w/ dilation 02/08/11, 2009 & 2007  . Esophagitis, erosive 2009  . Diverticulosis 07/2004    Colonscopy Dr Jena Gauss    Past Surgical History  Procedure Date  . Rt tka 05/13/07    Dr. Romeo Apple  . Right carpal tunnel release 02/01/2009  . Incontinence surgery 08/26/09    Tananbaum  . Cholecystectomy   . Abdominal hysterectomy   . Eye surgery   . Esophagogastroduodenoscopy 02/08/11   Rourk-Distal esophageal erosion consistent with mild erosive reflux   esophagitis/ Noncritical Schatzki ring, small hiatal hernia otherwise upper/ gastrointestinal tract appeared unremarkable, status post passage  of a Maloney dilation to biopsy disruption of the ring described    Current Outpatient Prescriptions  Medication Sig Dispense Refill  . ACCU-CHEK AVIVA PLUS test strip USE TO TEST BLOOD SUGAR 3TIMES A DAY.  100 each  12  . albuterol (PROVENTIL) (2.5 MG/3ML) 0.083% nebulizer solution Take 2.5 mg by nebulization every 6 (six) hours as needed.        Marland Kitchen amLODipine (NORVASC) 5 MG tablet Take 1 tablet (5 mg total) by mouth daily.  30 tablet  11  . benazepril (LOTENSIN) 20 MG tablet Take 1 tablet (20 mg total) by mouth daily.  30 tablet  11  . CELEXA 40 MG tablet TAKE ONE TABLET BY MOUTH DAILY.  30 each  3  . ciprofloxacin (CIPRO) 500 MG tablet Take 1 tablet (500 mg total) by mouth 2 (two) times daily.  14 tablet  0  . CLARITIN 10 MG tablet TAKE ONE TABLET BY MOUTH DAILY.  30 each  4  . CRESTOR 20 MG tablet TAKE (1) TABLET BY MOUTH AT BEDTIME.  30 each  3  . cycloSPORINE (RESTASIS) 0.05 % ophthalmic emulsion Place 1 drop into  both eyes 2 (two) times daily.        . DELTASONE 5 MG tablet TAKE 1 TABLET BY MOUTH DAILY.  30 each  3  . diltiazem (CARTIA XT) 240 MG 24 hr capsule Twice daily, dose increase effective 05/30/2011  60 capsule  11  . DITROPAN XL 10 MG 24 hr tablet TAKE 1 TABLET BY MOUTH DAILY.  30 each  3  . EFFEXOR XR 150 MG 24 hr capsule TAKE 1 CAPSULE BY MOUTH DAILY.  30 each  3  . gabapentin (NEURONTIN) 300 MG capsule Take 1 capsule (300 mg total) by mouth 3 (three) times daily.  90 capsule  5  . GLUCOVANCE 2.5-500 MG per tablet TAKE (2) TABLETS BY MOUTH TWICE DAILY.  120 each  3  . insulin glargine (LANTUS) 100 UNIT/ML injection 20 units at bedtime, 7 units in the morning . Dose increase effective 08/02/2011  10 mL  12  . iron polysaccharides (NIFEREX) 150 MG capsule Take 150 mg by  mouth 2 (two) times daily.        Marland Kitchen LASIX 40 MG tablet TAKE (1) TABLET BY MOUTH TWICE A DAY AS NEEDED FOR FLUID.  60 each  3  . LORTAB 10 10-500 MG per tablet TAKE 1 TABLET BY MOUTH 3 TIMES DAILY.  90 each  3  . losartan (COZAAR) 100 MG tablet Take 1 tablet (100 mg total) by mouth daily.  30 tablet  11  . MESTINON 60 MG tablet TAKE (1) TABLET BY MOUTH THREE TIMES A DAY.  90 each  3  . Multiple Vitamin (MULITIVITAMIN WITH MINERALS) TABS Take 1 tablet by mouth daily.        . nitrofurantoin (MACRODANTIN) 100 MG capsule Take 100 mg by mouth at bedtime.        Marland Kitchen PHENERGAN 25 MG tablet TAKE 1 TABLET BY MOUTH   ONCE DAILY AS NEEDED FORNAUSEA  30 each  2  . polyethylene glycol (MIRALAX / GLYCOLAX) packet Take 17 g by mouth as needed.        . potassium chloride (KLOR-CON) 20 MEQ packet Take 20 mEq by mouth 3 (three) times daily.        Marland Kitchen PRILOSEC 20 MG capsule TAKE ONE CAPSULE BY MOUTH DAILY.  30 each  3  . PROAIR HFA 108 (90 BASE) MCG/ACT inhaler INHALE 2 PUFFS BY MOUTH  EVERY 6 HOURS AS NEEDED .  8.5 each  0  . Probiotic Product (ALIGN PO) Take 4 mg by mouth daily.        Marland Kitchen ZESTORETIC 20-12.5 MG per tablet TAKE (2) TABLETS BY MOUTHAT BEDTIME.  60 each  1    Allergies as of 04/08/2012  . (No Known Allergies)    Family History:  There is no known family history of colorectal carcinoma , liver disease, or inflammatory bowel disease.   History   Social History  . Marital Status: Single    Spouse Name: N/A    Number of Children: N/A  . Years of Education: N/A   Occupational History  . disabled    Social History Main Topics  . Smoking status: Former Smoker -- 1.0 packs/day for 25 years    Quit date: 01/01/2004  . Smokeless tobacco: Not on file  . Alcohol Use: No  . Drug Use: No  . Sexually Active: Not on file  Review of Systems: Gen: Denies any fever, chills, sweats, anorexia.  C/o chronic fatigue, weakness, malaise.   CV: Denies chest pain, angina,  palpitations, syncope, orthopnea,  PND, peripheral edema, and claudication. Resp: Denies dyspnea at rest, dyspnea with exercise, cough, sputum, wheezing, and pleurisy. GI: Denies vomiting blood, jaundice, and fecal incontinence.   GU : +multiple recent UTIs.  Seeing urology. MS: c/o joint pain, limitation of movement, and swelling, stiffness, low back pain, extremity pain. chronic muscle weakness, cramps, atrophyw/ MG.  Derm: Denies rash, itching, dry skin, hives, moles, warts, or unhealing ulcers.  Psych: Denies depression, anxiety, memory loss, suicidal ideation, hallucinations, paranoia, and confusion. Heme: Denies bruising, bleeding, and enlarged lymph nodes. Neuro:  Denies any headaches, dizziness, paresthesias. Endo:  Denies any problems with DM, thyroid, adrenal function.  Physical Exam: BP 140/80  Pulse 88  Temp(Src) 97 F (36.1 C) (Temporal)  Ht 5\' 6"  (1.676 m)  Wt 268 lb 6.4 oz (121.745 kg)  BMI 43.32 kg/m2 General:   Alert,  Well-developed, obese, pleasant and cooperative in NAD Head:  Normocephalic and atraumatic. Eyes:  Sclera clear, no icterus.   Conjunctiva pink. Ears:  Normal auditory acuity. Nose:  No deformity, discharge,  or lesions. Mouth:  No deformity or lesions, dentition normal. Neck:  Supple; no masses or thyromegaly. Lungs:  Clear throughout to auscultation.   No wheezes, crackles, or rhonchi. No acute distress. Heart:  Regular rate and rhythm; 3/6 murmur noted. Abdomen:  +obese. Soft, nontender and nondistended. No masses, hepatosplenomegaly or hernias noted. Normal bowel sounds, without guarding, and without rebound.   Rectal:  Deferred.   Msk:  Symmetrical without gross deformities. Normal posture. Pulses:  Normal pulses noted. Extremities:  Without edema. Neurologic:  Alert and  oriented x4;  grossly normal neurologically. Skin:  Intact without significant lesions or rashes. Cervical Nodes:  No significant cervical adenopathy. Psych:  Alert and cooperative. Normal mood and  affect.

## 2012-04-08 NOTE — Patient Instructions (Signed)
Please return 3 stool cards for office as soon as possible Go get your labs drawn at [facility] Increase her omeprazole to 20 mg 30 minutes before breakfast and dinner I will call you with results of your x-ray Next screening colonoscopy August 2015 Continue MiraLax 17 g daily as needed for constipation

## 2012-04-08 NOTE — Assessment & Plan Note (Signed)
Refractory GERD & dysphagia.  Increase omeprazole to 20mg  BID.  May need EGD w/ ED.  Await BPE.

## 2012-04-08 NOTE — Assessment & Plan Note (Addendum)
Recurrent. History of Schatzki's ring.  Suspect recurrent ring.  Myasthenia gravis could be complicating picture as well.  BPE, if recurrent ring, EGD w/ esophageal dilation.

## 2012-04-08 NOTE — Assessment & Plan Note (Signed)
Improved on Miralax 17grams prn

## 2012-04-10 ENCOUNTER — Ambulatory Visit (HOSPITAL_COMMUNITY)
Admission: RE | Admit: 2012-04-10 | Discharge: 2012-04-10 | Disposition: A | Discharge status: Home / Self Care | Payer: Medicare Other | Source: Ambulatory Visit | Attending: Urgent Care | Admitting: Urgent Care

## 2012-04-10 ENCOUNTER — Other Ambulatory Visit: Payer: Self-pay | Admitting: Family Medicine

## 2012-04-10 ENCOUNTER — Telehealth: Payer: Self-pay | Admitting: Urgent Care

## 2012-04-10 DIAGNOSIS — K449 Diaphragmatic hernia without obstruction or gangrene: Secondary | ICD-10-CM | POA: Insufficient documentation

## 2012-04-10 DIAGNOSIS — K219 Gastro-esophageal reflux disease without esophagitis: Secondary | ICD-10-CM | POA: Insufficient documentation

## 2012-04-10 DIAGNOSIS — Q391 Atresia of esophagus with tracheo-esophageal fistula: Secondary | ICD-10-CM | POA: Insufficient documentation

## 2012-04-10 DIAGNOSIS — R1310 Dysphagia, unspecified: Secondary | ICD-10-CM | POA: Insufficient documentation

## 2012-04-10 DIAGNOSIS — K224 Dyskinesia of esophagus: Secondary | ICD-10-CM | POA: Insufficient documentation

## 2012-04-10 LAB — IRON AND TIBC: Iron: 40 ug/dL — ABNORMAL LOW (ref 42–145)

## 2012-04-10 LAB — FOLATE: Folate: >20 ng/mL

## 2012-04-10 LAB — VITAMIN B12: Vitamin B-12: 436 pg/mL (ref 211–911)

## 2012-04-10 NOTE — Telephone Encounter (Signed)
LMOM for return call  If this patient returns call while I'm not here, I called to go over her BPE results. The pill did get stuck at the Schatzki's ring in her esophagus, later passed. She will need an EGD with esophageal dilation by Dr. Jena Gauss. Since she has iron deficiency anemia, she will need colonoscopy at the same time since her last colonoscopy has been many years ago. She will need to be off her iron for one week prior to the procedure. Her Lantus insulin will need to be adjusted to take half the day before the procedure and hold her insulin the morning of the procedure. She should have an early morning appointment and bring all of her medications and insulin with her to the hospital. Check her blood sugars frequently and call her PCP or [place] if she has problems. Phenergan 25 mg IV 30 minutes prior to her procedure She should return stool cards to the office ASAP. Thanks [employer]

## 2012-04-11 NOTE — Telephone Encounter (Signed)
Pt aware, ok to schedule egd/ed/tcs.  Crystal please schedule.  Pt said she will return stool cards on Monday.

## 2012-04-11 NOTE — Progress Notes (Signed)
Quick Note:  Pt aware, she will return stool cards on Monday. Will forward to Dr. Lodema Hong. ______

## 2012-04-14 ENCOUNTER — Ambulatory Visit: Payer: Medicare Other | Admitting: Urgent Care

## 2012-04-14 ENCOUNTER — Other Ambulatory Visit: Payer: Self-pay | Admitting: *Deleted

## 2012-04-14 DIAGNOSIS — D649 Anemia, unspecified: Secondary | ICD-10-CM

## 2012-04-14 DIAGNOSIS — M79606 Pain in leg, unspecified: Secondary | ICD-10-CM

## 2012-04-14 LAB — POC HEMOCCULT BLD/STL (HOME/3-CARD/SCREEN)
Card #3 Fecal Occult Blood, POC: NEGATIVE
Fecal Occult Blood, POC: NEGATIVE

## 2012-04-14 MED ORDER — GABAPENTIN 300 MG PO CAPS: 300.0000 mg | ORAL_CAPSULE | Freq: Three times a day (TID) | ORAL | Status: DC

## 2012-04-14 NOTE — Progress Notes (Signed)
Quick Note:  Please let patient know there was no blood in her stool. Proceed with colonoscopy and EGD as planned. ______

## 2012-04-15 ENCOUNTER — Other Ambulatory Visit: Payer: Self-pay | Admitting: Gastroenterology

## 2012-04-15 DIAGNOSIS — D649 Anemia, unspecified: Secondary | ICD-10-CM

## 2012-04-15 MED ORDER — PEG-KCL-NACL-NASULF-NA ASC-C 100 G PO SOLR: 1.0000 | Freq: Once | ORAL | Status: DC

## 2012-04-15 NOTE — Telephone Encounter (Signed)
PT IS SCHEDULED FOR 05/09 @ 8:30- INSTRUCTIONS MAILED

## 2012-04-30 HISTORY — PX: ESOPHAGOGASTRODUODENOSCOPY: SHX1529

## 2012-04-30 NOTE — Progress Notes (Signed)
REVIEWED.  

## 2012-05-05 ENCOUNTER — Other Ambulatory Visit: Payer: Self-pay | Admitting: Family Medicine

## 2012-05-06 ENCOUNTER — Ambulatory Visit: Admit: 2012-05-06 | Payer: Self-pay | Admitting: Internal Medicine

## 2012-05-06 SURGERY — COLONOSCOPY
Anesthesia: Moderate Sedation

## 2012-05-07 ENCOUNTER — Other Ambulatory Visit: Payer: Self-pay | Admitting: Family Medicine

## 2012-05-07 ENCOUNTER — Encounter (HOSPITAL_COMMUNITY): Payer: Self-pay | Admitting: Pharmacy Technician

## 2012-05-07 MED ORDER — SODIUM CHLORIDE 0.45 % IV SOLN
Freq: Once | INTRAVENOUS | Status: AC
  Administered 2012-05-08: 08:00:00 via INTRAVENOUS

## 2012-05-08 ENCOUNTER — Ambulatory Visit: Admit: 2012-05-08 | Payer: Self-pay | Admitting: Internal Medicine

## 2012-05-08 ENCOUNTER — Encounter (HOSPITAL_COMMUNITY): Payer: Self-pay | Admitting: *Deleted

## 2012-05-08 ENCOUNTER — Ambulatory Visit (HOSPITAL_COMMUNITY)
Admission: RE | Admit: 2012-05-08 | Discharge: 2012-05-08 | Disposition: A | Discharge status: Home / Self Care | Payer: Medicare Other | Source: Ambulatory Visit | Attending: Internal Medicine | Admitting: Internal Medicine

## 2012-05-08 ENCOUNTER — Encounter (HOSPITAL_COMMUNITY)
Admission: RE | Disposition: A | Discharge status: Home / Self Care | Payer: Self-pay | Source: Ambulatory Visit | Attending: Internal Medicine

## 2012-05-08 DIAGNOSIS — K648 Other hemorrhoids: Secondary | ICD-10-CM

## 2012-05-08 DIAGNOSIS — D509 Iron deficiency anemia, unspecified: Secondary | ICD-10-CM | POA: Insufficient documentation

## 2012-05-08 DIAGNOSIS — K222 Esophageal obstruction: Secondary | ICD-10-CM | POA: Insufficient documentation

## 2012-05-08 DIAGNOSIS — Z01812 Encounter for preprocedural laboratory examination: Secondary | ICD-10-CM | POA: Insufficient documentation

## 2012-05-08 DIAGNOSIS — R1310 Dysphagia, unspecified: Secondary | ICD-10-CM | POA: Insufficient documentation

## 2012-05-08 DIAGNOSIS — I10 Essential (primary) hypertension: Secondary | ICD-10-CM | POA: Insufficient documentation

## 2012-05-08 DIAGNOSIS — K5730 Diverticulosis of large intestine without perforation or abscess without bleeding: Secondary | ICD-10-CM | POA: Insufficient documentation

## 2012-05-08 DIAGNOSIS — E119 Type 2 diabetes mellitus without complications: Secondary | ICD-10-CM | POA: Insufficient documentation

## 2012-05-08 DIAGNOSIS — D649 Anemia, unspecified: Secondary | ICD-10-CM

## 2012-05-08 DIAGNOSIS — R933 Abnormal findings on diagnostic imaging of other parts of digestive tract: Secondary | ICD-10-CM

## 2012-05-08 DIAGNOSIS — E785 Hyperlipidemia, unspecified: Secondary | ICD-10-CM | POA: Insufficient documentation

## 2012-05-08 DIAGNOSIS — K644 Residual hemorrhoidal skin tags: Secondary | ICD-10-CM | POA: Insufficient documentation

## 2012-05-08 HISTORY — PX: COLONOSCOPY: SHX5424

## 2012-05-08 LAB — GLUCOSE, CAPILLARY

## 2012-05-08 SURGERY — COLONOSCOPY
Anesthesia: Moderate Sedation

## 2012-05-08 MED ORDER — MEPERIDINE HCL 100 MG/ML IJ SOLN
INTRAMUSCULAR | Status: AC
  Filled 2012-05-08: qty 2

## 2012-05-08 MED ORDER — STERILE WATER FOR IRRIGATION IR SOLN
Status: DC | PRN
  Administered 2012-05-08: 09:00:00

## 2012-05-08 MED ORDER — MEPERIDINE HCL 100 MG/ML IJ SOLN
INTRAMUSCULAR | Status: DC | PRN
  Administered 2012-05-08: 50 mg via INTRAVENOUS
  Administered 2012-05-08 (×2): 25 mg via INTRAVENOUS

## 2012-05-08 MED ORDER — MIDAZOLAM HCL 5 MG/5ML IJ SOLN
INTRAMUSCULAR | Status: AC
  Filled 2012-05-08: qty 10

## 2012-05-08 MED ORDER — PROMETHAZINE HCL 25 MG/ML IJ SOLN
INTRAMUSCULAR | Status: AC
  Administered 2012-05-08: 25 mg via INTRAVENOUS
  Filled 2012-05-08: qty 1

## 2012-05-08 MED ORDER — BUTAMBEN-TETRACAINE-BENZOCAINE 2-2-14 % EX AERO
INHALATION_SPRAY | CUTANEOUS | Status: DC | PRN
  Administered 2012-05-08: 1 via TOPICAL

## 2012-05-08 MED ORDER — MIDAZOLAM HCL 5 MG/5ML IJ SOLN
INTRAMUSCULAR | Status: DC | PRN
  Administered 2012-05-08: 2 mg via INTRAVENOUS
  Administered 2012-05-08 (×2): 1 mg via INTRAVENOUS

## 2012-05-08 MED ORDER — SODIUM CHLORIDE 0.9 % IJ SOLN
INTRAMUSCULAR | Status: AC
  Filled 2012-05-08: qty 10

## 2012-05-08 MED ORDER — PROMETHAZINE HCL 25 MG/ML IJ SOLN
25.0000 mg | Freq: Once | INTRAMUSCULAR | Status: AC
  Administered 2012-05-08: 25 mg via INTRAVENOUS

## 2012-05-08 NOTE — Op Note (Signed)
[facility] [address] [place], [place]  [address]  COLONOSCOPY PROCEDURE REPORT  PATIENT:  Jenna, Washington  MR#:  [id number] BIRTHDATE:  [birth year], 71 yrs. old  GENDER:  female ENDOSCOPIST:  R. Roetta Sessions, MD FACP [facility] REF. BY:  Syliva Overman, M.D. PROCEDURE DATE:  05/08/2012 PROCEDURE:  Diagnostic colonoscopy  INDICATIONS:  Iron deficiency anemia; Hemoccult negative stool  INFORMED CONSENT:  The risks, benefits, alternatives and imponderables including but not limited to bleeding, perforation as well as the possibility of a missed lesion have been reviewed. The potential for biopsy, lesion removal, etc. have also been discussed.  Questions have been answered.  All parties agreeable. Please see the history and physical in the medical record for more information.  MEDICATIONS:  Versed 4 mg IV and Demerol 100 mg IV in divided doses.  DESCRIPTION OF PROCEDURE:  After a digital rectal exam was performed, the EG-[id number] ([id number]) and [id number] ([id number]) colonoscope was advanced from the anus through the rectum and colon to the area of the cecum, ileocecal valve and appendiceal orifice.  The cecum was deeply intubated.  These structures were well-seen and photographed for the record.  From the level of the cecum and ileocecal valve, the scope was slowly and cautiously withdrawn.  The mucosal surfaces were carefully surveyed utilizing scope tip deflection to facilitate fold flattening as needed.  The scope was pulled down into the rectum where a thorough examination including retroflexion was performed. <<PROCEDUREIMAGES>>  FINDINGS: Suboptimal preparation. External and internal hemorrhoids; otherwise normal rectum. Pan-colonic diverticula; remainder of colonic mucosa appeared normal.  THERAPEUTIC / DIAGNOSTIC MANEUVERS PERFORMED: None  COMPLICATIONS:  None  CECAL WITHDRAWAL TIME:   9 minutes  IMPRESSION:  Internal and external hemorrhoids;  colonic diverticulosis.  RECOMMENDATIONS: See EGD report. Would consider hematology evaluation for non-GI causes of iron deficiency. No further GI evaluation warranted unless the patient were to have evidence of GI bleeding or develop other GI symptoms. Recommend repeat colonoscopy for screening purposes in 10 years  ______________________________ R. Roetta Sessions, MD Caleen Essex  CC:  Syliva Overman, M.D.  n. eSIGNED:   R. Roetta Sessions at 05/08/2012 09:25 AM  [name], [phone]

## 2012-05-08 NOTE — H&P (View-Only) (Signed)
Referring Provider: Simpson, Margaret E, MD Primary Care Physician:  Margaret Simpson, MD, MD Primary Gastroenterologist:  Dr. Rourk  Chief Complaint  Patient presents with  . Dysphagia    x 1 month    HPI:  [name] is a 71 y.o. female here for follow up for recurrent dysphagia in the setting of chronic GERD, Schatzki's ring & myasthenia gravis.  Last esophageal dilation Feb 2012.  1 month ago she developed recurrent dysphagia, especially breads & meats.  She was eating Chili beans 3 days ago & vomited 3 hrs after eating.  C/o burning in epigastric area.  Taking Omeprazole 20mg qam.  Occaional regurgitation & choking spells w/ liquids including water.  Pills getting stuck too retrosternally.  C/o breakthrough heartburn & indigestion on omeprazole.  C/o Constipation but miralax is really helping.  Takes iron BID for chronic IDA.  Hgb has dropped from 11.2 in Feb 2012 to 9-range this yr.  She was admitted w/ small volume hemoptysis earlier this yr felt to be secondary to CAP.  Wt down 10# w/ walking.  She denies any rectal bleeding or melena.   Last colonoscopy 2005.   Past Medical History  Diagnosis Date  . Diabetes mellitus   . Hypertension   . Tobacco abuse   . Myasthenia gravis   . Arthritis   . Gastroesophageal reflux   . Thyroid disease   . Sleep apnea   . Iron deficiency anemia   . Depression   . Hyperlipidemia   . Obesity   . Carpal tunnel syndrome   . Shoulder pain   . Impingement syndrome, shoulder   . Pulmonary HTN   . Mitral regurgitation   . Schatzki's ring     Last EGD w/ dilation 02/08/11, 2009 & 2007  . Esophagitis, erosive 2009  . Diverticulosis 07/2004    Colonscopy Dr Rourk    Past Surgical History  Procedure Date  . Rt tka 05/13/07    Dr. Harrison  . Right carpal tunnel release 02/01/2009  . Incontinence surgery 08/26/09    Tananbaum  . Cholecystectomy   . Abdominal hysterectomy   . Eye surgery   . Esophagogastroduodenoscopy 02/08/11   Rourk-Distal esophageal erosion consistent with mild erosive reflux   esophagitis/ Noncritical Schatzki ring, small hiatal hernia otherwise upper/ gastrointestinal tract appeared unremarkable, status post passage  of a Maloney dilation to biopsy disruption of the ring described    Current Outpatient Prescriptions  Medication Sig Dispense Refill  . ACCU-CHEK AVIVA PLUS test strip USE TO TEST BLOOD SUGAR 3TIMES A DAY.  100 each  12  . albuterol (PROVENTIL) (2.5 MG/3ML) 0.083% nebulizer solution Take 2.5 mg by nebulization every 6 (six) hours as needed.        . amLODipine (NORVASC) 5 MG tablet Take 1 tablet (5 mg total) by mouth daily.  30 tablet  11  . benazepril (LOTENSIN) 20 MG tablet Take 1 tablet (20 mg total) by mouth daily.  30 tablet  11  . CELEXA 40 MG tablet TAKE ONE TABLET BY MOUTH DAILY.  30 each  3  . ciprofloxacin (CIPRO) 500 MG tablet Take 1 tablet (500 mg total) by mouth 2 (two) times daily.  14 tablet  0  . CLARITIN 10 MG tablet TAKE ONE TABLET BY MOUTH DAILY.  30 each  4  . CRESTOR 20 MG tablet TAKE (1) TABLET BY MOUTH AT BEDTIME.  30 each  3  . cycloSPORINE (RESTASIS) 0.05 % ophthalmic emulsion Place 1 drop into   both eyes 2 (two) times daily.        . DELTASONE 5 MG tablet TAKE 1 TABLET BY MOUTH DAILY.  30 each  3  . diltiazem (CARTIA XT) 240 MG 24 hr capsule Twice daily, dose increase effective 05/30/2011  60 capsule  11  . DITROPAN XL 10 MG 24 hr tablet TAKE 1 TABLET BY MOUTH DAILY.  30 each  3  . EFFEXOR XR 150 MG 24 hr capsule TAKE 1 CAPSULE BY MOUTH DAILY.  30 each  3  . gabapentin (NEURONTIN) 300 MG capsule Take 1 capsule (300 mg total) by mouth 3 (three) times daily.  90 capsule  5  . GLUCOVANCE 2.5-500 MG per tablet TAKE (2) TABLETS BY MOUTH TWICE DAILY.  120 each  3  . insulin glargine (LANTUS) 100 UNIT/ML injection 20 units at bedtime, 7 units in the morning . Dose increase effective 08/02/2011  10 mL  12  . iron polysaccharides (NIFEREX) 150 MG capsule Take 150 mg by  mouth 2 (two) times daily.        . LASIX 40 MG tablet TAKE (1) TABLET BY MOUTH TWICE A DAY AS NEEDED FOR FLUID.  60 each  3  . LORTAB 10 10-500 MG per tablet TAKE 1 TABLET BY MOUTH 3 TIMES DAILY.  90 each  3  . losartan (COZAAR) 100 MG tablet Take 1 tablet (100 mg total) by mouth daily.  30 tablet  11  . MESTINON 60 MG tablet TAKE (1) TABLET BY MOUTH THREE TIMES A DAY.  90 each  3  . Multiple Vitamin (MULITIVITAMIN WITH MINERALS) TABS Take 1 tablet by mouth daily.        . nitrofurantoin (MACRODANTIN) 100 MG capsule Take 100 mg by mouth at bedtime.        . PHENERGAN 25 MG tablet TAKE 1 TABLET BY MOUTH   ONCE DAILY AS NEEDED FORNAUSEA  30 each  2  . polyethylene glycol (MIRALAX / GLYCOLAX) packet Take 17 g by mouth as needed.        . potassium chloride (KLOR-CON) 20 MEQ packet Take 20 mEq by mouth 3 (three) times daily.        . PRILOSEC 20 MG capsule TAKE ONE CAPSULE BY MOUTH DAILY.  30 each  3  . PROAIR HFA 108 (90 BASE) MCG/ACT inhaler INHALE 2 PUFFS BY MOUTH  EVERY 6 HOURS AS NEEDED .  8.5 each  0  . Probiotic Product (ALIGN PO) Take 4 mg by mouth daily.        . ZESTORETIC 20-12.5 MG per tablet TAKE (2) TABLETS BY MOUTHAT BEDTIME.  60 each  1    Allergies as of 04/08/2012  . (No Known Allergies)    Family History:  There is no known family history of colorectal carcinoma , liver disease, or inflammatory bowel disease.   History   Social History  . Marital Status: Single    Spouse Name: N/A    Number of Children: N/A  . Years of Education: N/A   Occupational History  . disabled    Social History Main Topics  . Smoking status: Former Smoker -- 1.0 packs/day for 25 years    Quit date: 01/01/2004  . Smokeless tobacco: Not on file  . Alcohol Use: No  . Drug Use: No  . Sexually Active: Not on file  Review of Systems: Gen: Denies any fever, chills, sweats, anorexia.  C/o chronic fatigue, weakness, malaise.   CV: Denies chest pain, angina,   palpitations, syncope, orthopnea,  PND, peripheral edema, and claudication. Resp: Denies dyspnea at rest, dyspnea with exercise, cough, sputum, wheezing, and pleurisy. GI: Denies vomiting blood, jaundice, and fecal incontinence.   GU : +multiple recent UTIs.  Seeing urology. MS: c/o joint pain, limitation of movement, and swelling, stiffness, low back pain, extremity pain. chronic muscle weakness, cramps, atrophyw/ MG.  Derm: Denies rash, itching, dry skin, hives, moles, warts, or unhealing ulcers.  Psych: Denies depression, anxiety, memory loss, suicidal ideation, hallucinations, paranoia, and confusion. Heme: Denies bruising, bleeding, and enlarged lymph nodes. Neuro:  Denies any headaches, dizziness, paresthesias. Endo:  Denies any problems with DM, thyroid, adrenal function.  Physical Exam: BP 140/80  Pulse 88  Temp(Src) 97 F (36.1 C) (Temporal)  Ht 5' 6" (1.676 m)  Wt 268 lb 6.4 oz (121.745 kg)  BMI 43.32 kg/m2 General:   Alert,  Well-developed, obese, pleasant and cooperative in NAD Head:  Normocephalic and atraumatic. Eyes:  Sclera clear, no icterus.   Conjunctiva pink. Ears:  Normal auditory acuity. Nose:  No deformity, discharge,  or lesions. Mouth:  No deformity or lesions, dentition normal. Neck:  Supple; no masses or thyromegaly. Lungs:  Clear throughout to auscultation.   No wheezes, crackles, or rhonchi. No acute distress. Heart:  Regular rate and rhythm; 3/6 murmur noted. Abdomen:  +obese. Soft, nontender and nondistended. No masses, hepatosplenomegaly or hernias noted. Normal bowel sounds, without guarding, and without rebound.   Rectal:  Deferred.   Msk:  Symmetrical without gross deformities. Normal posture. Pulses:  Normal pulses noted. Extremities:  Without edema. Neurologic:  Alert and  oriented x4;  grossly normal neurologically. Skin:  Intact without significant lesions or rashes. Cervical Nodes:  No significant cervical adenopathy. Psych:  Alert and cooperative. Normal mood and  affect.      

## 2012-05-08 NOTE — Op Note (Signed)
[facility] [address] [place], [place]  [address]  ENDOSCOPY PROCEDURE REPORT  PATIENT:  [name], [name]  MR#:  [id number] BIRTHDATE:  27-Jul-1941, 71 yrs. old  GENDER:  female  ENDOSCOPIST:  R. Roetta Sessions, MD Caleen Essex Referred by:  Syliva Overman, M.D.  PROCEDURE DATE:  05/08/2012 PROCEDURE:  EGD with Elease Hashimoto dilation  INDICATIONS:   esophageal dysphagia; abnormal barium pill esophagram  INFORMED CONSENT:   The risks, benefits, limitations, alternatives and imponderables have been discussed.  The potential for biopsy, esophogeal dilation, etc. have also been reviewed.  Questions have been answered.  All parties agreeable.  Please see the history and physical in the medical record for more information.  MEDICATIONS:     Versed 3 mg IV and Demerol 75 mg in divided doses. Cetacaine spray.  DESCRIPTION OF PROCEDURE:   The EG-[id number] ([id number]) endoscope was introduced through the mouth and advanced to the second portion of the duodenum without difficulty or limitations.  The mucosal surfaces were surveyed very carefully during advancement of the scope and upon withdrawal.  Retroflexion view of the proximal stomach and esophagogastric junction was performed.  <<PROCEDUREIMAGES>>  FINDINGS:  2 tandem incomplete distal esophageal rings. Overlying esophageal mucosa otherwise appeared normal. Stomach empty. Moderate size hiatal hernia; otherwise, normal gastric mucosa. Patent pylorus. Normal first and second portion of the     duodenum.  THERAPEUTIC / DIAGNOSTIC MANEUVERS PERFORMED: A 56 French Maloney dilator was passed to full insertion easily. A look back revealed rings remain intact. Subsequently, 4 quadrant  "bites" with biopsy forceps were taken to additionally disrupt these rings. This was done effectively without apparent complication.  COMPLICATIONS:   None  IMPRESSION:              Distal esophageal rings-status post dilation and disruption as  described above. Hiatal hernia.  RECOMMENDATIONS:     Continue acid suppression therapy daily. See colonoscopy report.  ______________________________ R. Roetta Sessions, MD Caleen Essex  CC:  n. eSIGNED:   R. Roetta Sessions at 05/08/2012 08:58 AM  [name], [phone]

## 2012-05-08 NOTE — Discharge Instructions (Signed)
Colonoscopy Discharge Instructions  Read the instructions outlined below and refer to this sheet in the next few weeks. These discharge instructions provide you with general information on caring for yourself after you leave the hospital. Your doctor may also give you specific instructions. While your treatment has been planned according to the most current medical practices available, unavoidable complications occasionally occur. If you have any problems or questions after discharge, call Dr. Gala Romney at [phone]. ACTIVITY  You may resume your regular activity, but move at a slower pace for the next 24 hours.   Take frequent rest periods for the next 24 hours.   Walking will help get rid of the air and reduce the bloated feeling in your belly (abdomen).   No driving for 24 hours (because of the medicine (anesthesia) used during the test).    Do not sign any important legal documents or operate any machinery for 24 hours (because of the anesthesia used during the test).  NUTRITION  Drink plenty of fluids.   You may resume your normal diet as instructed by your doctor.   Begin with a light meal and progress to your normal diet. Heavy or fried foods are harder to digest and may make you feel sick to your stomach (nauseated).   Avoid alcoholic beverages for 24 hours or as instructed.  MEDICATIONS  You may resume your normal medications unless your doctor tells you otherwise.  WHAT YOU CAN EXPECT TODAY  Some feelings of bloating in the abdomen.   Passage of more gas than usual.   Spotting of blood in your stool or on the toilet paper.  IF YOU HAD POLYPS REMOVED DURING THE COLONOSCOPY:  No aspirin products for 7 days or as instructed.   No alcohol for 7 days or as instructed.   Eat a soft diet for the next 24 hours.  FINDING OUT THE RESULTS OF YOUR TEST Not all test results are available during your visit. If your test results are not back during the visit, make an appointment  with your caregiver to find out the results. Do not assume everything is normal if you have not heard from your caregiver or the medical facility. It is important for you to follow up on all of your test results.  SEEK IMMEDIATE MEDICAL ATTENTION IF:  You have more than a spotting of blood in your stool.   Your belly is swollen (abdominal distention).   You are nauseated or vomiting.   You have a temperature over 101.  You have abdominal pain or discomfort that is severe or gets worse throughout the day. EGD Discharge instructions Please read the instructions outlined below and refer to this sheet in the next few weeks. These discharge instructions provide you with general information on caring for yourself after you leave the hospital. Your doctor may also give you specific instructions. While your treatment has been planned according to the most current medical practices available, unavoidable complications occasionally occur. If you have any problems or questions after discharge, please call your doctor. ACTIVITY You may resume your regular activity but move at a slower pace for the next 24 hours.  Take frequent rest periods for the next 24 hours.  Walking will help expel (get rid of) the air and reduce the bloated feeling in your abdomen.  No driving for 24 hours (because of the anesthesia (medicine) used during the test).  You may shower.  Do not sign any important legal documents or operate any machinery for 24  hours (because of the anesthesia used during the test).  NUTRITION Drink plenty of fluids.  You may resume your normal diet.  Begin with a light meal and progress to your normal diet.  Avoid alcoholic beverages for 24 hours or as instructed by your caregiver.  MEDICATIONS You may resume your normal medications unless your caregiver tells you otherwise.  WHAT YOU CAN EXPECT TODAY You may experience abdominal discomfort such as a feeling of fullness or "gas" pains.   FOLLOW-UP Your doctor will discuss the results of your test with you.  SEEK IMMEDIATE MEDICAL ATTENTION IF ANY OF THE FOLLOWING OCCUR: Excessive nausea (feeling sick to your stomach) and/or vomiting.  Severe abdominal pain and distention (swelling).  Trouble swallowing.  Temperature over 101 F (37.8 C).  Rectal bleeding or vomiting of blood.     Continue Prilosec daily.  Diverticulosis information provided.  Repeat colonoscopy for screening purposes in 10 years.  Followup with Dr. Lodema Hong Diverticulosis Diverticulosis is a common condition that develops when small pouches (diverticula) form in the wall of the colon. The risk of diverticulosis increases with age. It happens more often in people who eat a low-fiber diet. Most individuals with diverticulosis have no symptoms. Those individuals with symptoms usually experience abdominal pain, constipation, or loose stools (diarrhea). HOME CARE INSTRUCTIONS  Increase the amount of fiber in your diet as directed by your caregiver or dietician. This may reduce symptoms of diverticulosis.  Your caregiver may recommend taking a dietary fiber supplement.  Drink at least 6 to 8 glasses of water each day to prevent constipation.  Try not to strain when you have a bowel movement.  Your caregiver may recommend avoiding nuts and seeds to prevent complications, although this is still an uncertain benefit.  Only take over-the-counter or prescription medicines for pain, discomfort, or fever as directed by your caregiver.  FOODS WITH HIGH FIBER CONTENT INCLUDE: Fruits. Apple, peach, pear, tangerine, raisins, prunes.  Vegetables. Brussels sprouts, asparagus, broccoli, cabbage, carrot, cauliflower, romaine lettuce, spinach, summer squash, tomato, winter squash, zucchini.  Starchy Vegetables. Baked beans, kidney beans, lima beans, split peas, lentils, potatoes (with skin).  Grains. Whole wheat bread, brown rice, bran flake cereal, plain oatmeal, white  rice, shredded wheat, bran muffins.  SEEK IMMEDIATE MEDICAL CARE IF:  You develop increasing pain or severe bloating.  You have an oral temperature above 102 F (38.9 C), not controlled by medicine.  You develop vomiting or bowel movements that are bloody or black.  Document Released: 09/13/2004 Document Revised: 12/06/2011 Document Reviewed: 05/17/2010 [facility] Patient Information 2012 [place], [place].

## 2012-05-08 NOTE — Interval H&P Note (Signed)
History and Physical Interval Note:  05/08/2012 8:34 AM  [name]  has presented today for surgery, with the diagnosis of ANEMIA  The various methods of treatment have been discussed with the patient and family. After consideration of risks, benefits and other options for treatment, the patient has consented to  Procedure(s) (LRB): COLONOSCOPY (N/A) ESOPHAGOGASTRODUODENOSCOPY (EGD) WITH ESOPHAGEAL DILATION (N/A) as a surgical intervention .  The patients' history has been reviewed, patient examined, no change in status, stable for surgery.  I have reviewed the patients' chart and labs.  Questions were answered to the patient's satisfaction.     Eula Listen  Patient iron deficient. Hemoccult negative. Barium pill esophagram indicated the pill hung at the level of the ring.

## 2012-05-14 ENCOUNTER — Encounter (HOSPITAL_COMMUNITY): Payer: Self-pay | Admitting: Internal Medicine

## 2012-05-27 ENCOUNTER — Ambulatory Visit (INDEPENDENT_AMBULATORY_CARE_PROVIDER_SITE_OTHER): Payer: Medicare Other | Admitting: Family Medicine

## 2012-05-27 ENCOUNTER — Encounter: Payer: Self-pay | Admitting: Family Medicine

## 2012-05-27 VITALS — BP 160/80 | HR 77 | Resp 18 | Ht 66.0 in | Wt 264.0 lb

## 2012-05-27 DIAGNOSIS — E785 Hyperlipidemia, unspecified: Secondary | ICD-10-CM

## 2012-05-27 DIAGNOSIS — R32 Unspecified urinary incontinence: Secondary | ICD-10-CM

## 2012-05-27 DIAGNOSIS — E039 Hypothyroidism, unspecified: Secondary | ICD-10-CM

## 2012-05-27 DIAGNOSIS — F419 Anxiety disorder, unspecified: Secondary | ICD-10-CM

## 2012-05-27 DIAGNOSIS — IMO0001 Reserved for inherently not codable concepts without codable children: Secondary | ICD-10-CM

## 2012-05-27 DIAGNOSIS — I10 Essential (primary) hypertension: Secondary | ICD-10-CM

## 2012-05-27 DIAGNOSIS — E1065 Type 1 diabetes mellitus with hyperglycemia: Secondary | ICD-10-CM

## 2012-05-27 DIAGNOSIS — F411 Generalized anxiety disorder: Secondary | ICD-10-CM

## 2012-05-27 DIAGNOSIS — K219 Gastro-esophageal reflux disease without esophagitis: Secondary | ICD-10-CM

## 2012-05-27 MED ORDER — OXYBUTYNIN CHLORIDE ER 10 MG PO TB24: ORAL_TABLET | ORAL | Status: DC

## 2012-05-27 MED ORDER — LISINOPRIL-HYDROCHLOROTHIAZIDE 20-12.5 MG PO TABS: ORAL_TABLET | ORAL | Status: DC

## 2012-05-27 NOTE — Assessment & Plan Note (Addendum)
Uncontrolled,med dose increased, has not been taking as directed

## 2012-05-27 NOTE — Progress Notes (Signed)
  Subjective:    Patient ID: [name], female    DOB: 1941-08-31, 71 y.o.   MRN: [id number]  HPI The PT is here for follow up and re-evaluation of chronic medical conditions, medication management and review of any available recent lab and radiology data.  Preventive health is updated, specifically  Cancer screening and Immunization.   Questions or concerns regarding consultations or procedures which the PT has had in the interim are  addressed. The PT denies any adverse reactions to current medications since the last visit.  There are no new concerns.  There are no specific complaints  Py is non compliant wit medication for her diabetes, sometimes skipping insulin dose, Explained again the importance of regular testing and up titration of insulin when needed      Review of Systems See HPI Denies recent fever or chills. Denies sinus pressure, nasal congestion, ear pain or sore throat. Denies chest congestion, productive cough or wheezing. Denies chest pains, palpitations and leg swelling Denies abdominal pain, nausea, vomiting,diarrhea or constipation.   Denies dysuria, frequency, hesitancy or incontinence. Chronic  joint pain,  and limitation in mobility. Denies headaches, seizures, numbness, or tingling. Denies depression, anxiety or insomnia. Denies skin break down or rash.        Objective:   Physical Exam  Patient alert and oriented and in no cardiopulmonary distress.  HEENT: No facial asymmetry, EOMI, no sinus tenderness,  oropharynx pink and moist.  Neck supple no adenopathy.  Chest: Clear to auscultation bilaterally.  CVS: S1, S2 no murmurs, no S3.  ABD: Soft non tender. Bowel sounds normal.  Ext: No edema  MS: decreased  ROM spine, shoulders, hips and knees.  Skin: Intact, no ulcerations or rash noted.  Psych: Good eye contact, normal affect. Memory intact not anxious or depressed appearing.  CNS: CN 2-12 intact, power, tone and sensation normal  throughout.       Assessment & Plan:

## 2012-05-27 NOTE — Patient Instructions (Addendum)
F/u in 6 weeks  Your blood pressure is high, you need to start taking zestoretic TWO tablets every morning, and continue the other medication as before, stop lasix, call if your legs start  swelling, your new blood pressure medication has a diuretic   Blood sugar is under 100 reportedly, bedtime generally around 180 which sounds great, please cut back on carbs , also start lantus in the morning only , since you report missing most evening doses. Start taking 10 units every morning before breakfast, increase by 3 units every 3 days if fasting sugars are averaging over 130. Call with any questions  HBA1C fasting lipid and cmp and egFR in 6 weeks before next visit

## 2012-06-03 NOTE — Assessment & Plan Note (Signed)
Low fat diet discussed and encouraged, LDL slightly elevated needs to reduce fat intake. No med change

## 2012-06-03 NOTE — Assessment & Plan Note (Addendum)
Uncontrolled , needs to more closely follow eating plan for weight loss and increase physical activity, no med change, but copmpliance remains an issue

## 2012-06-03 NOTE — Assessment & Plan Note (Signed)
Controlled, no med change, followed by GI

## 2012-06-03 NOTE — Assessment & Plan Note (Signed)
Continues to be a problem despite urology involvement including therapy

## 2012-06-03 NOTE — Assessment & Plan Note (Signed)
Followed by endo.  

## 2012-06-03 NOTE — Assessment & Plan Note (Signed)
Improved on medication °

## 2012-06-04 ENCOUNTER — Other Ambulatory Visit: Payer: Self-pay | Admitting: Family Medicine

## 2012-06-11 ENCOUNTER — Other Ambulatory Visit: Payer: Self-pay | Admitting: Family Medicine

## 2012-06-23 ENCOUNTER — Other Ambulatory Visit: Payer: Self-pay | Admitting: Family Medicine

## 2012-07-08 ENCOUNTER — Ambulatory Visit: Payer: Medicare Other | Admitting: Family Medicine

## 2012-07-11 ENCOUNTER — Other Ambulatory Visit: Payer: Self-pay | Admitting: Family Medicine

## 2012-07-11 LAB — HEMOGLOBIN A1C: Hgb A1c MFr Bld: 7.5 % — ABNORMAL HIGH (ref <5.7)

## 2012-07-11 LAB — COMPLETE METABOLIC PANEL WITH GFR
ALT: 14 U/L (ref 0–35)
CO2: 29 mEq/L (ref 19–32)
Calcium: 9.5 mg/dL (ref 8.4–10.5)
Chloride: 105 mEq/L (ref 96–112)
GFR, Est African American: 72 mL/min
Sodium: 143 mEq/L (ref 135–145)
Total Protein: 6.6 g/dL (ref 6.0–8.3)

## 2012-07-11 LAB — LIPID PANEL: LDL Cholesterol: 88 mg/dL (ref 0–99)

## 2012-08-01 ENCOUNTER — Other Ambulatory Visit: Payer: Self-pay | Admitting: Family Medicine

## 2012-08-04 ENCOUNTER — Ambulatory Visit (INDEPENDENT_AMBULATORY_CARE_PROVIDER_SITE_OTHER): Payer: Medicare Other | Admitting: Family Medicine

## 2012-08-04 ENCOUNTER — Encounter: Payer: Self-pay | Admitting: Family Medicine

## 2012-08-04 VITALS — BP 130/70 | HR 80 | Resp 18 | Ht 66.0 in | Wt 262.0 lb

## 2012-08-04 DIAGNOSIS — IMO0001 Reserved for inherently not codable concepts without codable children: Secondary | ICD-10-CM

## 2012-08-04 DIAGNOSIS — E669 Obesity, unspecified: Secondary | ICD-10-CM

## 2012-08-04 DIAGNOSIS — E039 Hypothyroidism, unspecified: Secondary | ICD-10-CM

## 2012-08-04 DIAGNOSIS — E785 Hyperlipidemia, unspecified: Secondary | ICD-10-CM

## 2012-08-04 DIAGNOSIS — E1065 Type 1 diabetes mellitus with hyperglycemia: Secondary | ICD-10-CM

## 2012-08-04 DIAGNOSIS — I10 Essential (primary) hypertension: Secondary | ICD-10-CM

## 2012-08-04 MED ORDER — INSULIN GLARGINE 100 UNIT/ML ~~LOC~~ SOLN: SUBCUTANEOUS | Status: DC

## 2012-08-04 NOTE — Patient Instructions (Addendum)
Annual wellness in 4 month, please call if you need me before.  Call in October for flu vaccine      Blood pressure, cholesterol, kidneys and liver are excellent.  Please commit to at least 5 days per week at the [facility], this will help your overall health.  Blood sugar slightly elevated.  Goal for fasting blood sugar ranges from 80 to 120 and 2 hours after any meal or at bedtime should be between 130 to 170.  INCREASE lantus to 25 units daily, call if blood sugars still too high for furhter dose increase after 3 to 4 weeks  HBA1C in 4 month, non fasting, 3 days before f/u

## 2012-08-10 NOTE — Assessment & Plan Note (Signed)
Thyroid disorder is followed by endo

## 2012-08-10 NOTE — Assessment & Plan Note (Signed)
Deteriorated. Patient re-educated about  the importance of commitment to a  minimum of 150 minutes of exercise per week. The importance of healthy food choices with portion control discussed. Encouraged to start a food diary, count calories and to consider  joining a support group. Sample diet sheets offered. Goals set by the patient for the next several months.    

## 2012-08-10 NOTE — Progress Notes (Signed)
  Subjective:    Patient ID: [name], female    DOB: 07-24-1941, 71 y.o.   MRN: [id number]  HPI The PT is here for follow up and re-evaluation of chronic medical conditions, medication management and review of any available recent lab and radiology data.  Preventive health is updated, specifically  Cancer screening and Immunization.   Questions or concerns regarding consultations or procedures which the PT has had in the interim are  addressed. The PT denies any adverse reactions to current medications since the last visit.  There are no new concerns.  There are no specific complaints       Review of Systems See HPI Denies recent fever or chills. Denies sinus pressure, nasal congestion, ear pain or sore throat. Denies chest congestion, productive cough or wheezing. Denies chest pains, palpitations and leg swelling Denies abdominal pain, nausea, vomiting,diarrhea or constipation.   Denies dysuria, frequency, hesitancy or incontinence. Denies joint pain, swelling and limitation in mobility. Denies headaches, seizures, numbness, or tingling. Denies depression, anxiety or insomnia. Denies skin break down or rash.        Objective:   Physical Exam Patient alert and oriented and in no cardiopulmonary distress.  HEENT: No facial asymmetry, EOMI, no sinus tenderness,  oropharynx pink and moist.  Neck supple no adenopathy.  Chest: Clear to auscultation bilaterally.  CVS: S1, S2 no murmurs, no S3.  ABD: Soft non tender. Bowel sounds normal.  Ext: No edema  MS: Adequate ROM spine, shoulders, hips and knees.  Skin: Intact, no ulcerations or rash noted.  Psych: Good eye contact, normal affect. Memory intact not anxious or depressed appearing.  CNS: CN 2-12 intact, power, tone and sensation normal throughout.        Assessment & Plan:

## 2012-08-10 NOTE — Assessment & Plan Note (Signed)
Controlled, no change in medication DASH diet and commitment to daily physical activity for a minimum of 30 minutes discussed and encouraged, as a part of hypertension management. The importance of attaining a healthy weight is also discussed.  

## 2012-08-10 NOTE — Assessment & Plan Note (Signed)
Controlled, no change in medication Hyperlipidemia:Low fat diet discussed and encouraged.  \ 

## 2012-08-10 NOTE — Assessment & Plan Note (Signed)
Not as well controlled, increase  Dose of insulin, and closer attention to diet and exercise

## 2012-08-27 ENCOUNTER — Other Ambulatory Visit: Payer: Self-pay

## 2012-08-27 ENCOUNTER — Telehealth: Payer: Self-pay | Admitting: Family Medicine

## 2012-08-27 MED ORDER — TEMAZEPAM 30 MG PO CAPS: 30.0000 mg | ORAL_CAPSULE | Freq: Every evening | ORAL | Status: DC | PRN

## 2012-08-27 NOTE — Telephone Encounter (Signed)
pls send for Pa form  For temazepam and fill out as able

## 2012-08-29 NOTE — Telephone Encounter (Signed)
I have sent in the med. If needing a PA the pharmacy will send me back a PA request with insurance info and PA number

## 2012-09-01 ENCOUNTER — Other Ambulatory Visit: Payer: Self-pay | Admitting: Family Medicine

## 2012-09-02 ENCOUNTER — Other Ambulatory Visit: Payer: Self-pay | Admitting: Family Medicine

## 2012-09-19 ENCOUNTER — Other Ambulatory Visit: Payer: Self-pay | Admitting: Family Medicine

## 2012-09-29 ENCOUNTER — Other Ambulatory Visit: Payer: Self-pay | Admitting: Family Medicine

## 2012-10-03 ENCOUNTER — Other Ambulatory Visit: Payer: Self-pay | Admitting: Family Medicine

## 2012-10-09 ENCOUNTER — Other Ambulatory Visit: Payer: Self-pay | Admitting: Family Medicine

## 2012-10-10 ENCOUNTER — Other Ambulatory Visit: Payer: Self-pay | Admitting: Family Medicine

## 2012-10-29 ENCOUNTER — Other Ambulatory Visit: Payer: Self-pay | Admitting: Family Medicine

## 2012-11-21 ENCOUNTER — Other Ambulatory Visit: Payer: Self-pay | Admitting: Family Medicine

## 2012-11-25 ENCOUNTER — Other Ambulatory Visit: Payer: Self-pay | Admitting: Orthopedic Surgery

## 2012-11-25 DIAGNOSIS — M549 Dorsalgia, unspecified: Secondary | ICD-10-CM

## 2012-11-25 DIAGNOSIS — M79606 Pain in leg, unspecified: Secondary | ICD-10-CM

## 2012-11-25 MED ORDER — GABAPENTIN 300 MG PO CAPS: 300.0000 mg | ORAL_CAPSULE | Freq: Three times a day (TID) | ORAL | Status: DC

## 2012-12-03 ENCOUNTER — Other Ambulatory Visit: Payer: Self-pay | Admitting: Family Medicine

## 2012-12-04 ENCOUNTER — Encounter: Payer: Self-pay | Admitting: Family Medicine

## 2012-12-04 ENCOUNTER — Ambulatory Visit (INDEPENDENT_AMBULATORY_CARE_PROVIDER_SITE_OTHER): Payer: Medicare Other | Admitting: Family Medicine

## 2012-12-04 VITALS — BP 140/74 | HR 82 | Resp 15 | Ht 66.0 in | Wt 242.0 lb

## 2012-12-04 DIAGNOSIS — Z0000 Encounter for general adult medical examination without abnormal findings: Secondary | ICD-10-CM

## 2012-12-04 DIAGNOSIS — E669 Obesity, unspecified: Secondary | ICD-10-CM

## 2012-12-04 DIAGNOSIS — E1065 Type 1 diabetes mellitus with hyperglycemia: Secondary | ICD-10-CM

## 2012-12-04 DIAGNOSIS — E039 Hypothyroidism, unspecified: Secondary | ICD-10-CM

## 2012-12-04 DIAGNOSIS — D649 Anemia, unspecified: Secondary | ICD-10-CM

## 2012-12-04 DIAGNOSIS — E785 Hyperlipidemia, unspecified: Secondary | ICD-10-CM

## 2012-12-04 NOTE — Progress Notes (Signed)
Subjective:    Patient ID: [name], female    DOB: 1941/12/30, 71 y.o.   MRN: [id number]  HPI Preventive Screening-Counseling & Management   Patient present here today for a Medicare annual wellness visit.   Current Problems (verified)   Medications Prior to Visit Allergies (verified)   PAST HISTORY  Family History 1 brother and one sister liver, 4 deceased. Cancer, skin ,uin mother, one sister Engineer, manufacturing systems  Social History Never married, mother of 2 adult children retired in 2000, was a [place] Quit nicotine in 2006, never alcohol or street drug use   Risk Factors  Current exercise habits: none, high fall risk, needs to do sitting exercises   Dietary issues discussed:Low carb, low fat diet  Cardiac risk factors:   Depression Screen  (Note: if answer to either of the following is "Yes", a more complete depression screening is indicated)   Over the past two weeks, have you felt down, depressed or hopeless? No  Over the past two weeks, have you felt little interest or pleasure in doing things? No  Have you lost interest or pleasure in daily life? No  Do you often feel hopeless? No  Do you cry easily over simple problems? No   Activities of Daily Living  In your present state of health, do you have any difficulty performing the following activities?  Driving?: No Managing money?: No Feeding yourself?:No Getting from bed to chair?:yes Climbing a flight of stairs?:yes Preparing food and eating?:No Bathing or showering?:yes Getting dressed?:No Getting to the toilet?:yes Using the toilet?:yes Moving around from place to place?: yes  Fall Risk Assessment In the past year have you fallen or had a near fall?:yes, low back and left lower ext pain and weakness with unsteady gait Are you currently taking any medications that make you dizziness?:No   Hearing Difficulties: No Do you often ask people to speak up or repeat themselves?:No Do you experience ringing or noises  in your ears?:No Do you have difficulty understanding soft or whispered voices?:No  Cognitive Testing  Alert? Yes Normal Appearance?Yes  Oriented to person? Yes Place? Yes  Time? Yes  Displays appropriate judgment?Yes  Can read the correct time from a watch face? yes Are you having problems remembering things?No  Advanced Directives have been discussed with the patient?Yes , full code   List the Names of Other Physician/Practitioners you currently JYN:[facility]     Assessment:    Annual Wellness Exam   Plan:    During the course of the visit the patient was educated and counseled about appropriate screening and preventive services including:  A healthy diet is rich in fruit, vegetables and whole grains. Poultry fish, nuts and beans are a healthy choice for protein rather then red meat. A low sodium diet and drinking 64 ounces of water daily is generally recommended. Oils and sweet should be limited. Carbohydrates especially for those who are diabetic or overweight, should be limited to 30-45 gram per meal. It is important to eat on a regular schedule, at least 3 times daily. Snacks should be primarily fruits, vegetables or nuts. It is important that you exercise regularly at least 30 minutes 5 times a week. If you develop chest pain, have severe difficulty breathing, or feel very tired, stop exercising immediately and seek medical attention  Immunization reviewed and updated. Cancer screening reviewed and updated    Patient Instructions (the written plan) was given to the patient.  Medicare Attestation  I have personally reviewed:  The patient's  medical and social history  Their use of alcohol, tobacco or illicit drugs  Their current medications and supplements  The patient's functional ability including ADLs,fall risks, home safety risks, cognitive, and hearing and visual impairment  Diet and physical activities  Evidence for depression or mood disorders  The patient's weight,  height, BMI, and visual acuity have been recorded in the chart. I have made referrals, counseling, and provided education to the patient based on review of the above and I have provided the patient with a written personalized care plan for preventive services.     Review of Systems     Objective:   Physical Exam        Assessment & Plan:

## 2012-12-04 NOTE — Patient Instructions (Addendum)
F/u in 4 month  You will get a script for shower chair and lift seat for commode  You will get a copy of a form to discuss with your children re end of life issues.  Fasting lipid, cmp, tsh and EGFR this  Week if possiple  HBa1C in 4 month, and CBc in 4 month  Consider getting help with low back and left lower extremity pain and instability in the new year, you need it

## 2012-12-07 DIAGNOSIS — Z0000 Encounter for general adult medical examination without abnormal findings: Secondary | ICD-10-CM | POA: Insufficient documentation

## 2012-12-07 NOTE — Assessment & Plan Note (Signed)
Annual wellness exam completed and documented. Pt is severely challenged with regard to safe mobility, she is a high fall risk and has fallen more than twice in recent times. She reports an unsteadiness of gait as well as having severe osteoarthritis, states she cannot afford to see any specialists about these problems which are not new, have been evaluated in the past and are apparently worsening

## 2012-12-08 LAB — LIPID PANEL
HDL: 55 mg/dL (ref >39)
LDL Cholesterol: 80 mg/dL (ref 0–99)
Total CHOL/HDL Ratio: 2.7 Ratio
VLDL: 16 mg/dL (ref 0–40)

## 2012-12-08 LAB — COMPLETE METABOLIC PANEL WITH GFR
ALT: 12 U/L (ref 0–35)
AST: 12 U/L (ref 0–37)
Alkaline Phosphatase: 119 U/L — ABNORMAL HIGH (ref 39–117)
Sodium: 140 mEq/L (ref 135–145)
Total Bilirubin: 0.3 mg/dL (ref 0.3–1.2)
Total Protein: 6.5 g/dL (ref 6.0–8.3)

## 2012-12-12 ENCOUNTER — Telehealth: Payer: Self-pay | Admitting: Family Medicine

## 2012-12-12 ENCOUNTER — Other Ambulatory Visit: Payer: Self-pay | Admitting: Family Medicine

## 2012-12-12 NOTE — Telephone Encounter (Signed)
Pls let pt know that her insurance papers she left have been reviewed. Instead of phenergan for nausea, she starts zofran in January , and instead of loratidine she changes to certrizine, due to ins recommendations for coverage. I have entered changes historically pls fax and notify pharmacy also

## 2012-12-12 NOTE — Telephone Encounter (Signed)
Glucovance is also to be changed in Jan to glipizide/metformin, new script also entered historically, plas let pt and pharmacy know

## 2012-12-17 NOTE — Telephone Encounter (Signed)
Patient aware. Will send notification to pharmacy.  

## 2012-12-17 NOTE — Telephone Encounter (Signed)
Patient aware. Will send notification to pharmacy.

## 2012-12-19 ENCOUNTER — Other Ambulatory Visit: Payer: Self-pay

## 2012-12-19 ENCOUNTER — Other Ambulatory Visit: Payer: Self-pay | Admitting: Family Medicine

## 2012-12-19 MED ORDER — GLIPIZIDE-METFORMIN HCL 2.5-500 MG PO TABS: 2.0000 | ORAL_TABLET | Freq: Two times a day (BID) | ORAL | Status: DC

## 2012-12-19 MED ORDER — ONDANSETRON HCL 4 MG PO TABS: 4.0000 mg | ORAL_TABLET | Freq: Every day | ORAL | Status: DC | PRN

## 2012-12-19 MED ORDER — CETIRIZINE HCL 10 MG PO TBDP: 1.0000 | ORAL_TABLET | Freq: Every day | ORAL | Status: DC

## 2012-12-19 NOTE — Telephone Encounter (Signed)
Note and rx faxed.

## 2012-12-19 NOTE — Telephone Encounter (Signed)
Note along with rx faxed.

## 2013-01-09 ENCOUNTER — Other Ambulatory Visit: Payer: Self-pay | Admitting: Family Medicine

## 2013-01-09 ENCOUNTER — Other Ambulatory Visit: Payer: Self-pay

## 2013-01-09 MED ORDER — CLONAZEPAM 0.5 MG PO TABS: 0.5000 mg | ORAL_TABLET | Freq: Three times a day (TID) | ORAL | Status: DC | PRN

## 2013-01-19 ENCOUNTER — Other Ambulatory Visit: Payer: Self-pay | Admitting: Family Medicine

## 2013-02-25 ENCOUNTER — Other Ambulatory Visit: Payer: Self-pay | Admitting: Family Medicine

## 2013-03-24 ENCOUNTER — Other Ambulatory Visit: Payer: Self-pay | Admitting: Family Medicine

## 2013-03-27 ENCOUNTER — Other Ambulatory Visit: Payer: Self-pay | Admitting: Family Medicine

## 2013-03-31 ENCOUNTER — Telehealth: Payer: Self-pay | Admitting: Family Medicine

## 2013-03-31 NOTE — Telephone Encounter (Signed)
Pls let pt know I got a msg from the manufacturer that she needs to check with her pharmacy to see if the venlafaxine she has is in a lot that was recently recalled. Thjs is IMPT  (They stated they had notified her too)

## 2013-04-03 ENCOUNTER — Telehealth: Payer: Self-pay | Admitting: Family Medicine

## 2013-04-03 NOTE — Telephone Encounter (Signed)
Patient aware and hers were not affected

## 2013-04-03 NOTE — Telephone Encounter (Signed)
Lab order sent

## 2013-04-07 ENCOUNTER — Ambulatory Visit: Payer: Medicare Other | Admitting: Family Medicine

## 2013-04-09 ENCOUNTER — Other Ambulatory Visit: Payer: Self-pay | Admitting: Family Medicine

## 2013-04-09 DIAGNOSIS — Z139 Encounter for screening, unspecified: Secondary | ICD-10-CM

## 2013-04-14 ENCOUNTER — Ambulatory Visit (HOSPITAL_COMMUNITY)
Admission: RE | Admit: 2013-04-14 | Discharge: 2013-04-14 | Disposition: A | Discharge status: Home / Self Care | Payer: Medicare Other | Source: Ambulatory Visit | Attending: Family Medicine | Admitting: Family Medicine

## 2013-04-14 DIAGNOSIS — Z139 Encounter for screening, unspecified: Secondary | ICD-10-CM

## 2013-04-14 DIAGNOSIS — Z1231 Encounter for screening mammogram for malignant neoplasm of breast: Secondary | ICD-10-CM | POA: Insufficient documentation

## 2013-04-15 LAB — CBC WITH DIFFERENTIAL/PLATELET
Basophils Relative: 0 % (ref 0–1)
Eosinophils Absolute: 0.1 10*3/uL (ref 0.0–0.7)
HCT: 30.3 % — ABNORMAL LOW (ref 36.0–46.0)
Hemoglobin: 9.7 g/dL — ABNORMAL LOW (ref 12.0–15.0)
Lymphs Abs: 2.2 10*3/uL (ref 0.7–4.0)
MCH: 25.8 pg — ABNORMAL LOW (ref 26.0–34.0)
MCHC: 32 g/dL (ref 30.0–36.0)
Monocytes Absolute: 0.8 10*3/uL (ref 0.1–1.0)
Monocytes Relative: 10 % (ref 3–12)
Neutro Abs: 4.8 10*3/uL (ref 1.7–7.7)
RBC: 3.76 MIL/uL — ABNORMAL LOW (ref 3.87–5.11)

## 2013-04-15 LAB — HEMOGLOBIN A1C: Mean Plasma Glucose: 171 mg/dL — ABNORMAL HIGH (ref <117)

## 2013-04-22 ENCOUNTER — Other Ambulatory Visit: Payer: Self-pay | Admitting: Family Medicine

## 2013-04-28 ENCOUNTER — Ambulatory Visit (INDEPENDENT_AMBULATORY_CARE_PROVIDER_SITE_OTHER): Payer: Medicare Other | Admitting: Family Medicine

## 2013-04-28 DIAGNOSIS — I10 Essential (primary) hypertension: Secondary | ICD-10-CM

## 2013-04-28 NOTE — Progress Notes (Signed)
  Subjective:    Patient ID: [name], female    DOB: December 13, 1941, 72 y.o.   MRN: [id number]  HPI  Pt did not keep this appt  Review of Systems     Objective:   Physical Exam        Assessment & Plan:

## 2013-04-29 ENCOUNTER — Ambulatory Visit (INDEPENDENT_AMBULATORY_CARE_PROVIDER_SITE_OTHER): Payer: Medicare Other | Admitting: Family Medicine

## 2013-04-29 ENCOUNTER — Encounter: Payer: Self-pay | Admitting: Family Medicine

## 2013-04-29 VITALS — BP 162/70 | HR 70 | Resp 18 | Ht 66.0 in | Wt 260.1 lb

## 2013-04-29 DIAGNOSIS — E669 Obesity, unspecified: Secondary | ICD-10-CM

## 2013-04-29 DIAGNOSIS — M159 Polyosteoarthritis, unspecified: Secondary | ICD-10-CM

## 2013-04-29 DIAGNOSIS — IMO0002 Reserved for concepts with insufficient information to code with codable children: Secondary | ICD-10-CM

## 2013-04-29 DIAGNOSIS — S0990XA Unspecified injury of head, initial encounter: Secondary | ICD-10-CM | POA: Insufficient documentation

## 2013-04-29 DIAGNOSIS — Z5189 Encounter for other specified aftercare: Secondary | ICD-10-CM

## 2013-04-29 DIAGNOSIS — E1065 Type 1 diabetes mellitus with hyperglycemia: Secondary | ICD-10-CM

## 2013-04-29 DIAGNOSIS — Z9181 History of falling: Secondary | ICD-10-CM

## 2013-04-29 DIAGNOSIS — R296 Repeated falls: Secondary | ICD-10-CM

## 2013-04-29 DIAGNOSIS — E785 Hyperlipidemia, unspecified: Secondary | ICD-10-CM

## 2013-04-29 DIAGNOSIS — M549 Dorsalgia, unspecified: Secondary | ICD-10-CM

## 2013-04-29 DIAGNOSIS — M25569 Pain in unspecified knee: Secondary | ICD-10-CM

## 2013-04-29 DIAGNOSIS — F3289 Other specified depressive episodes: Secondary | ICD-10-CM

## 2013-04-29 DIAGNOSIS — M25519 Pain in unspecified shoulder: Secondary | ICD-10-CM

## 2013-04-29 DIAGNOSIS — R51 Headache: Secondary | ICD-10-CM

## 2013-04-29 DIAGNOSIS — E039 Hypothyroidism, unspecified: Secondary | ICD-10-CM

## 2013-04-29 DIAGNOSIS — F329 Major depressive disorder, single episode, unspecified: Secondary | ICD-10-CM

## 2013-04-29 DIAGNOSIS — M1710 Unilateral primary osteoarthritis, unspecified knee: Secondary | ICD-10-CM

## 2013-04-29 DIAGNOSIS — S0990XD Unspecified injury of head, subsequent encounter: Secondary | ICD-10-CM

## 2013-04-29 DIAGNOSIS — R519 Headache, unspecified: Secondary | ICD-10-CM | POA: Insufficient documentation

## 2013-04-29 DIAGNOSIS — M25562 Pain in left knee: Secondary | ICD-10-CM

## 2013-04-29 MED ORDER — DICLOFENAC SODIUM 1 % TD GEL: TRANSDERMAL | Status: DC

## 2013-04-29 MED ORDER — PREDNISONE (PAK) 5 MG PO TABS: 5.0000 mg | ORAL_TABLET | ORAL | Status: DC

## 2013-04-29 NOTE — Patient Instructions (Addendum)
Annual wellness in 4.5  month, call if you need me before  You are referred to Dr Eulah Pont re left knee, with recurrent falls and bilateral shoulder pain, hopefully appt in May.  You are referred to physical therapy at [facility] for bilateral shoulder, back and knee pain  You are referred to pain specialist in June , Dr Eduard Clos or [facility] for [facility] pain  You are referred for head scan  Due to recurrent falls and report of memory loss  You will have fomal memory eval today, please keep up with news, and involved in the community, as able. I am glad you have a dog!  Chem 7 and EGFR  and TSH today.  Microalb from office today.  Prednisone dose pack sent in for pain, do not take daily prednisone while taking this  and toradol 60mg   and depo medrol 80mg  in office today for pain  HBA1C, fasting lipid, and cmp in 4.5 month

## 2013-04-29 NOTE — Progress Notes (Signed)
  Subjective:    Patient ID: [name], female    DOB: [birth year], 72 y.o.   MRN: [id number]  HPI Pt reports that her back and left knee and both shoulders hurt excessively, pain medication is not working. States  she fell 3 days ago while getting out of her chair and hit the back of her head, concerned about memory disturbance. Has noted headache since she fell Fallen 3 times this year, most often due to left knee giving out, requests ortho eval of same. States blood sugar control is sub optimal and plans to work on this. Denies uncontrolled depression   Review of Systems See HPI Denies recent fever or chills. Denies sinus pressure, nasal congestion, ear pain or sore throat. Denies chest congestion, productive cough or wheezing. Denies chest pains, palpitations and leg swelling Denies abdominal pain, nausea, vomiting,diarrhea or constipation.   Denies dysuria, frequency, hesitancy or incontinence. Denies  seizures, numbness, or tingling.  Denies skin break down or rash.        Objective:   Physical Exam  Patient alert and oriented and in no cardiopulmonary distress.  HEENT: No facial asymmetry, EOMI, no sinus tenderness,  oropharynx pink and moist.  Neck decreased ROM no adenopathy.  Chest: Clear to auscultation bilaterally.  CVS: S1, S2 no murmurs, no S3.  ABD: Soft non tender. Bowel sounds normal.  Ext: No edema  MS: decreased  ROM spine, shoulders, hips and knees.  Skin: Intact, no ulcerations or rash noted.  Psych: Good eye contact, normal affect. Memory intact mildly anxious not  depressed appearing.  CNS: CN 2-12 intact, power, tone and sensation normal throughout.       Assessment & Plan:

## 2013-04-30 ENCOUNTER — Other Ambulatory Visit: Payer: Self-pay | Admitting: Family Medicine

## 2013-04-30 LAB — COMPLETE METABOLIC PANEL WITH GFR
ALT: 10 U/L (ref 0–35)
BUN: 26 mg/dL — ABNORMAL HIGH (ref 6–23)
CO2: 26 mEq/L (ref 19–32)
Calcium: 9.1 mg/dL (ref 8.4–10.5)
Chloride: 103 mEq/L (ref 96–112)
Creat: 1.42 mg/dL — ABNORMAL HIGH (ref 0.50–1.10)
GFR, Est African American: 43 mL/min — ABNORMAL LOW
GFR, Est Non African American: 37 mL/min — ABNORMAL LOW
Glucose, Bld: 182 mg/dL — ABNORMAL HIGH (ref 70–99)
Total Bilirubin: 0.3 mg/dL (ref 0.3–1.2)

## 2013-04-30 LAB — MICROALBUMIN / CREATININE URINE RATIO
Creatinine, Urine: 164.7 mg/dL
Microalb, Ur: 8.44 mg/dL — ABNORMAL HIGH (ref 0.00–1.89)

## 2013-04-30 LAB — TSH: TSH: 1.343 u[IU]/mL (ref 0.350–4.500)

## 2013-04-30 MED ORDER — METHYLPREDNISOLONE ACETATE 80 MG/ML IJ SUSP
80.0000 mg | Freq: Once | INTRAMUSCULAR | Status: AC
  Administered 2013-04-30: 80 mg via INTRAMUSCULAR

## 2013-04-30 MED ORDER — KETOROLAC TROMETHAMINE 60 MG/2ML IJ SOLN
60.0000 mg | Freq: Once | INTRAMUSCULAR | Status: AC
  Administered 2013-04-29: 60 mg via INTRAMUSCULAR

## 2013-05-01 ENCOUNTER — Other Ambulatory Visit: Payer: Self-pay | Admitting: Family Medicine

## 2013-05-05 ENCOUNTER — Telehealth: Payer: Self-pay | Admitting: Family Medicine

## 2013-05-06 NOTE — Telephone Encounter (Signed)
Patient is aware 

## 2013-05-09 LAB — BASIC METABOLIC PANEL
BUN: 28 mg/dL — ABNORMAL HIGH (ref 6–23)
Creat: 1.06 mg/dL (ref 0.50–1.10)
Glucose, Bld: 121 mg/dL — ABNORMAL HIGH (ref 70–99)

## 2013-05-14 ENCOUNTER — Ambulatory Visit (HOSPITAL_COMMUNITY): Admission: RE | Admit: 2013-05-14 | Payer: Medicare Other | Source: Ambulatory Visit | Admitting: Physical Therapy

## 2013-05-14 ENCOUNTER — Telehealth: Payer: Self-pay | Admitting: Family Medicine

## 2013-05-18 ENCOUNTER — Ambulatory Visit (HOSPITAL_COMMUNITY)
Admission: RE | Admit: 2013-05-18 | Discharge: 2013-05-18 | Disposition: A | Discharge status: Home / Self Care | Payer: Medicare Other | Source: Ambulatory Visit | Attending: Family Medicine | Admitting: Family Medicine

## 2013-05-18 ENCOUNTER — Encounter (HOSPITAL_COMMUNITY): Payer: Self-pay

## 2013-05-18 DIAGNOSIS — R51 Headache: Secondary | ICD-10-CM | POA: Insufficient documentation

## 2013-05-18 DIAGNOSIS — Z9181 History of falling: Secondary | ICD-10-CM | POA: Insufficient documentation

## 2013-05-18 DIAGNOSIS — R413 Other amnesia: Secondary | ICD-10-CM | POA: Insufficient documentation

## 2013-05-21 ENCOUNTER — Other Ambulatory Visit: Payer: Self-pay | Admitting: Family Medicine

## 2013-05-21 NOTE — Telephone Encounter (Signed)
Patient is aware 

## 2013-05-25 DIAGNOSIS — R296 Repeated falls: Secondary | ICD-10-CM | POA: Insufficient documentation

## 2013-05-25 DIAGNOSIS — M159 Polyosteoarthritis, unspecified: Secondary | ICD-10-CM | POA: Insufficient documentation

## 2013-05-25 NOTE — Assessment & Plan Note (Signed)
Controlled, no change in medication  

## 2013-05-25 NOTE — Assessment & Plan Note (Signed)
Deteriorated. Patient re-educated about  the importance of commitment to a  minimum of 150 minutes of exercise per week. The importance of healthy food choices with portion control discussed. Encouraged to start a food diary, count calories and to consider  joining a support group. Sample diet sheets offered. Goals set by the patient for the next several months.    

## 2013-05-25 NOTE — Assessment & Plan Note (Signed)
Dose increase in medicatio n and pt to be more diligent with carb counting  Patient educated about the importance of limiting  Carbohydrate intake , the need to commit to daily physical activity for a minimum of 30 minutes , and to commit weight loss. The fact that changes in all these areas will reduce or eliminate all together the development of diabetes is stressed.

## 2013-05-25 NOTE — Assessment & Plan Note (Signed)
Increased frequency and severity , brain scan ordered

## 2013-05-25 NOTE — Assessment & Plan Note (Signed)
Recurrent head trauma with perceived memory loss, will refer for brain scan

## 2013-05-25 NOTE — Assessment & Plan Note (Signed)
Hyperlipidemia:Low fat diet discussed and encouraged.  Controlled when last tested, updated lab next visit

## 2013-05-25 NOTE — Assessment & Plan Note (Signed)
Increased pain and uncontrolled, anti inflammatories and steroid burst

## 2013-05-25 NOTE — Assessment & Plan Note (Signed)
Uncontrolled , anti inflammatories in office and prescribed

## 2013-05-26 ENCOUNTER — Ambulatory Visit (HOSPITAL_COMMUNITY)
Admission: RE | Admit: 2013-05-26 | Discharge: 2013-05-26 | Disposition: A | Discharge status: Home / Self Care | Payer: Medicare Other | Source: Ambulatory Visit | Attending: Family Medicine | Admitting: Family Medicine

## 2013-05-26 DIAGNOSIS — I10 Essential (primary) hypertension: Secondary | ICD-10-CM | POA: Insufficient documentation

## 2013-05-26 DIAGNOSIS — M25569 Pain in unspecified knee: Secondary | ICD-10-CM | POA: Insufficient documentation

## 2013-05-26 DIAGNOSIS — R29898 Other symptoms and signs involving the musculoskeletal system: Secondary | ICD-10-CM | POA: Insufficient documentation

## 2013-05-26 DIAGNOSIS — IMO0001 Reserved for inherently not codable concepts without codable children: Secondary | ICD-10-CM | POA: Insufficient documentation

## 2013-05-26 DIAGNOSIS — W19XXXA Unspecified fall, initial encounter: Secondary | ICD-10-CM | POA: Insufficient documentation

## 2013-05-26 DIAGNOSIS — M25619 Stiffness of unspecified shoulder, not elsewhere classified: Secondary | ICD-10-CM | POA: Insufficient documentation

## 2013-05-26 DIAGNOSIS — M25669 Stiffness of unspecified knee, not elsewhere classified: Secondary | ICD-10-CM | POA: Insufficient documentation

## 2013-05-26 DIAGNOSIS — E119 Type 2 diabetes mellitus without complications: Secondary | ICD-10-CM | POA: Insufficient documentation

## 2013-05-26 DIAGNOSIS — M6281 Muscle weakness (generalized): Secondary | ICD-10-CM | POA: Insufficient documentation

## 2013-05-26 DIAGNOSIS — R262 Difficulty in walking, not elsewhere classified: Secondary | ICD-10-CM | POA: Insufficient documentation

## 2013-05-26 DIAGNOSIS — M549 Dorsalgia, unspecified: Secondary | ICD-10-CM | POA: Insufficient documentation

## 2013-05-26 DIAGNOSIS — M25519 Pain in unspecified shoulder: Secondary | ICD-10-CM | POA: Insufficient documentation

## 2013-05-26 NOTE — Evaluation (Signed)
Physical Therapy Evaluation  Patient Details  Name: [name] MRN: [id number] Date of Birth: 1941-04-19  Today's Date: 05/26/2013 Time: 4098-1191 PT Time Calculation (min): 35 min              Visit#: 1 of 12  Re-eval: 06/25/13 Assessment Diagnosis: knee, back and shoulder pain Next MD Visit: 08/2013  Authorization: [facility] medicare     Authorization Visit#: 1 of 10   Past Medical History:  Past Medical History  Diagnosis Date  . Diabetes mellitus   . Hypertension   . Tobacco abuse   . Myasthenia gravis   . Arthritis   . Gastroesophageal reflux   . Thyroid disease   . Sleep apnea   . Iron deficiency anemia   . Depression   . Hyperlipidemia   . Obesity   . Carpal tunnel syndrome   . Shoulder pain   . Impingement syndrome, shoulder   . Pulmonary HTN   . Mitral regurgitation   . Schatzki's ring     Last EGD w/ dilation 02/08/11, 2009 & 2007  . Esophagitis, erosive 2009  . Diverticulosis 07/2004    Colonscopy Dr Jena Gauss   Past Surgical History:  Past Surgical History  Procedure Laterality Date  . Rt tka  05/13/07    Dr. Romeo Apple  . Right carpal tunnel release  02/01/2009  . Incontinence surgery  08/26/09    Tananbaum  . Cholecystectomy    . Abdominal hysterectomy    . Eye surgery    . Esophagogastroduodenoscopy  02/08/11    Rourk-Distal esophageal erosion consistent with mild erosive reflux   esophagitis/ Noncritical Schatzki ring, small hiatal hernia otherwise upper/ gastrointestinal tract appeared unremarkable, status post passage  of a Maloney dilation to biopsy disruption of the ring described  . Colonoscopy  05/08/2012    Procedure: COLONOSCOPY;  Surgeon: Corbin Ade, MD;  Location: [facility];  Service: Endoscopy;  Laterality: N/A;  8:30  . Joint replacement      right total knee    Subjective Symptoms/Limitations Symptoms: [name] states that she has been bothered for years with her L knee.  She had a R TKR several years ago that has never been  right.  She needs a Lt knee replacement but wants to avoid it as long as possible.  She has fallen 1-2 times a week for the past six months.  The patient also states that she has chronic LBP which prevents her from standing even long enough to do the dishes.  She also complains of B shoulder pain stating that they are tight and sore.  States she is having difficulty reashing up and rearranging things in the cabinet.   Pertinent History:  crohn dz ; Myo gravis. How long can you sit comfortably?: 20-30 minutes. How long can you stand comfortably?: able to stand for about ten minutes then her back begins to bother her.  More on the L side of her back than the right. How long can you walk comfortably?: Pt does not walk  Pain Assessment Currently in Pain?: Yes Pain Score:   8 Pain Location: Knee Pain Orientation: Left Pain Type: Chronic pain Pain Onset: More than a month ago Pain Frequency: Intermittent Pain Relieving Factors: shots, hydrocortisone Effect of Pain on Daily Activities: increases Multiple Pain Sites: Yes  Precautions/Restrictions     Balance Screening Balance Screen Has the patient fallen in the past 6 months: Yes How many times?: 25 Has the patient had a decrease in activity  level because of a fear of falling? : Yes Is the patient reluctant to leave their home because of a fear of falling? : Yes    Sensation/Coordination/Flexibility/Functional Tests Functional Tests Functional Tests: 4 sit to stand in 30 seconds  Assessment RUE AROM (degrees) RUE Overall AROM Comments:  (WNL) RUE Strength Right Shoulder Flexion: 4/5 Right Shoulder ABduction: 4/5 Right Shoulder Internal Rotation: 3/5 Right Shoulder External Rotation: 3/5 LUE AROM (degrees) LUE Overall AROM Comments:  (wnl) LUE Strength LUE Overall Strength Comments: Flex/Ab 4/5 ; IR/ER 3/5 RLE Strength Right Hip Flexion: 3-/5 Right Hip Extension: 3-/5 Right Hip ABduction: 3-/5 Right Knee Flexion: 5/5 Right  Knee Extension: 3+/5 Right Ankle Dorsiflexion: 3/5 Right Ankle Plantar Flexion: 3-/5 LLE Strength Left Hip Flexion: 3-/5 Left Hip Extension: 3-/5 Left Hip ABduction: 3-/5 Left Knee Flexion: 5/5 Left Knee Extension: 4/5 Left Ankle Dorsiflexion: 3+/5 Left Ankle Plantar Flexion: 3-/5  Exercise/Treatments Seated Long Arc Quad: 10 reps   (shoulder flex, abduction, Internal Rotation,external rotatio) Other Seated Knee Exercises: ankle Dorsiflexion/Plantarflexion x10 Other Seated Knee Exercises: ab set/ heel turn in/out x 10  Supine Bridges: 10 reps Straight Leg Raises: 10 reps Sidelying Hip ABduction: 10 reps  Physical Therapy Assessment and Plan PT Assessment and Plan Clinical Impression Statement: Pt with decreased core and LE strength who will benefit from skilled therapy to improve pt safety and funcional mobility. Rehab Potential: Good PT Frequency: Min 2X/week PT Duration: 6 weeks PT Treatment/Interventions: Gait training;Therapeutic exercise;Therapeutic activities;Balance training PT Plan: gt train with cane; heel raises, minisquats, lateral step up B, terminal knee extension B standing and supine, progress to balance activities.    Goals Home Exercise Program Pt will Perform Home Exercise Program: Independently PT Short Term Goals Time to Complete Short Term Goals: 2 weeks PT Short Term Goal 1: Pt strength to be increased by 1/2 grade to allow pt to be able to stand for 15 minutes at a time to be able to complete her dishes while standing. PT Short Term Goal 2: Pt pain level to be no greater than a 5/10 70% of the day. PT Short Term Goal 3: Pt to be ambutlating with a cane. PT Long Term Goals Time to Complete Long Term Goals: 4 weeks PT Long Term Goal 1: Pt to be I in advanced HEP PT Long Term Goal 2: Pt strength to be incrreased by 1 grade to allow pt to be able to walk for 20 minutes without fear of falling Long Term Goal 3: no fallis for the past  seven days. Long  Term Goal 4: Pain no greater than a 3/10 70% of the day. PT Long Term Goal 5: Pt to be able to do her housework  Additional PT Long Term Goals?: Yes PT Long Term Goal 6: 10 sit to stand in 30 second time period  Problem List Patient Active Problem List   Diagnosis Date Noted  . Falls 05/26/2013  . Difficulty in walking 05/26/2013  . Bilateral leg weakness 05/26/2013  . Recurrent falls 05/25/2013  . Generalized osteoarthritis 05/25/2013  . Head trauma 04/29/2013  . Headache 04/29/2013  . Routine general medical examination at a health care facility 12/07/2012  . Dysphagia 04/03/2012  . Abnormal chest CT 03/02/2012  . Anxiety 01/02/2012  . Insomnia 01/02/2012  . Hypertension goal BP (blood pressure) < 130/80 05/30/2011  . Schatzki's ring 05/07/2011  . THYROID FUNCTION TEST, ABNORMAL 02/01/2011  . CONSTIPATION 01/29/2011  . NAUSEA 01/29/2011  . INGROWN TOENAIL 01/10/2011  . BACK  PAIN 10/15/2010  . GLAUCOMA 08/08/2010  . DYSCHROMIA, UNSPECIFIED 08/08/2010  . FATIGUE 03/27/2010  . ANEMIA 12/01/2009  . CHRONIC KIDNEY DISEASE UNSPECIFIED 09/14/2009  . HIP PAIN, LEFT 06/09/2009  . UNSPECIFIED URINARY INCONTINENCE 06/09/2009  . OBESITY, UNSPECIFIED 03/06/2009  . NEOPLASMS UNSPEC NATURE BONE SOFT TISSUE&SKIN 01/24/2009  . KNEE PAIN 11/24/2008  . Anemia of other chronic disease 10/02/2008  . HYPOTHYROIDISM 09/27/2008  . HYPERLIPIDEMIA 06/30/2008  . DEPRESSION 06/30/2008  . GERD 06/30/2008  . SLEEP APNEA 06/30/2008  . LEG EDEMA, BILATERAL 06/30/2008  . CARPAL TUNNEL SYNDROME 05/19/2008  . TOTAL KNEE FOLLOW-UP 05/19/2008  . SHOULDER PAIN 02/02/2008  . IMPINGEMENT SYNDROME 02/02/2008  . DEGENERATIVE JOINT DISEASE, KNEE 11/24/2007  . Diabetes mellitus, insulin dependent (IDDM), uncontrolled 09/09/2007    PT Plan of Care PT Home Exercise Plan: given  GP Functional Assessment Tool Used: clinical judgement  Functional Limitation: Mobility: Walking and moving around Mobility:  Walking and Moving Around Current Status ([id number]): At least 60 percent but less than 80 percent impaired, limited or restricted Mobility: Walking and Moving Around Goal Status [phone]): At least 20 percent but less than 40 percent impaired, limited or restricted  Lio Wehrly,CINDY 05/26/2013, 4:17 PM  Physician Documentation Your signature is required to indicate approval of the treatment plan as stated above.  Please sign and either send electronically or make a copy of this report for your files and return this physician signed original.   Please mark one 1.__approve of plan  2. ___approve of plan with the following conditions.   ______________________________                                                          _____________________ Physician Signature                                                                                                             Date

## 2013-05-29 ENCOUNTER — Telehealth (HOSPITAL_COMMUNITY): Payer: Self-pay

## 2013-05-29 ENCOUNTER — Inpatient Hospital Stay (HOSPITAL_COMMUNITY): Admission: RE | Admit: 2013-05-29 | Payer: Medicare Other | Source: Ambulatory Visit | Admitting: Physical Therapy

## 2013-06-01 ENCOUNTER — Other Ambulatory Visit: Payer: Self-pay | Admitting: Family Medicine

## 2013-06-02 ENCOUNTER — Ambulatory Visit (HOSPITAL_COMMUNITY)
Admission: RE | Admit: 2013-06-02 | Discharge: 2013-06-02 | Disposition: A | Discharge status: Home / Self Care | Payer: Medicare Other | Source: Ambulatory Visit | Attending: Family Medicine | Admitting: Family Medicine

## 2013-06-02 DIAGNOSIS — M6281 Muscle weakness (generalized): Secondary | ICD-10-CM | POA: Insufficient documentation

## 2013-06-02 DIAGNOSIS — M549 Dorsalgia, unspecified: Secondary | ICD-10-CM | POA: Insufficient documentation

## 2013-06-02 DIAGNOSIS — M25619 Stiffness of unspecified shoulder, not elsewhere classified: Secondary | ICD-10-CM | POA: Insufficient documentation

## 2013-06-02 DIAGNOSIS — M25519 Pain in unspecified shoulder: Secondary | ICD-10-CM | POA: Insufficient documentation

## 2013-06-02 DIAGNOSIS — E119 Type 2 diabetes mellitus without complications: Secondary | ICD-10-CM | POA: Insufficient documentation

## 2013-06-02 DIAGNOSIS — M25669 Stiffness of unspecified knee, not elsewhere classified: Secondary | ICD-10-CM | POA: Insufficient documentation

## 2013-06-02 DIAGNOSIS — I10 Essential (primary) hypertension: Secondary | ICD-10-CM | POA: Insufficient documentation

## 2013-06-02 DIAGNOSIS — IMO0001 Reserved for inherently not codable concepts without codable children: Secondary | ICD-10-CM | POA: Insufficient documentation

## 2013-06-02 DIAGNOSIS — M25569 Pain in unspecified knee: Secondary | ICD-10-CM | POA: Insufficient documentation

## 2013-06-02 NOTE — Progress Notes (Signed)
Physical Therapy Treatment Patient Details  Name: [name] MRN: [id number] Date of Birth: 1941/01/04  Today's Date: 06/02/2013 Time: 7829-5621 PT Time Calculation (min): 31 min Visit#: 2 of 12  Re-eval: 06/25/13 Authorization: [employer] medicare  Authorization Visit#: 2 of 10  Charges:  Gait training [phone] (8'), therex [phone] (19')  Subjective: Symptoms/Limitations Symptoms: Pt tearful/crying in waiting room due to loss of her dog.  States she was up all night with him and he passed away she thinks from poisoning.  Able to convince pt. to particpate.  STates she is having only minimal discomfort in her Left knee today, 2/10.  Pt is ambulating today without an AD.   Exercise/Treatments Standing Heel Raises: 10 reps;Limitations Heel Raises Limitations: toeraises 10 reps Functional Squat: 10 reps Gait Training: with SPC X 175' Seated Long Arc Quad: 10 reps Supine Hip Adduction Isometric: 10 reps Bridges: 10 reps Straight Leg Raises: 10 reps     Physical Therapy Assessment and Plan PT Assessment and Plan Clinical Impression Statement: Pt reluctant to participate today due to grieving at the loss of her dog.  Pt able to complete most of session.  Added heel/toe raises and squats in standing with VC's for form.  Pt with difficulty sequencing gait using SPC with overall slower cadence.  Pt  educated on places to obtain a SPC/approximate cost. PT Plan: Continue to gait train with SPC,add lateral step up and TKE in standing.  Progress balance activities.        Lurena Nida, PTA/CLT 06/02/2013, 3:12 PM

## 2013-06-04 ENCOUNTER — Ambulatory Visit (HOSPITAL_COMMUNITY): Payer: Medicare Other | Admitting: *Deleted

## 2013-06-04 ENCOUNTER — Other Ambulatory Visit: Payer: Self-pay | Admitting: Family Medicine

## 2013-06-09 ENCOUNTER — Ambulatory Visit (HOSPITAL_COMMUNITY): Payer: Medicare Other | Admitting: *Deleted

## 2013-06-11 ENCOUNTER — Ambulatory Visit (HOSPITAL_COMMUNITY)
Admission: RE | Admit: 2013-06-11 | Discharge: 2013-06-11 | Disposition: A | Discharge status: Home / Self Care | Payer: Medicare Other | Source: Ambulatory Visit | Attending: Family Medicine | Admitting: Family Medicine

## 2013-06-11 NOTE — Progress Notes (Signed)
Physical Therapy Treatment Patient Details  Name: [name] MRN: [id number] Date of Birth: 03-21-1941  Today's Date: 06/11/2013 Time: 1500-1544 PT Time Calculation (min): 44 min  Visit#: 3 of 12  Re-eval: 06/25/13 Charges: Therex x 30' Ice x 10'  Authorization: [employer] medicare  Authorization Visit#: 3 of 10   Subjective: Symptoms/Limitations Symptoms: Pt states that she fell on both knees last night. She was walking and they gave out. Pain Assessment Currently in Pain?: Yes Pain Score:   8 Pain Location: Knee Pain Orientation: Right   Exercise/Treatments Seated Long Arc Quad: 10 reps Supine Quad Sets: 10 reps Hip Adduction Isometric: 10 reps Bridges: 10 reps Straight Leg Raises: 10 reps Sidelying Hip ABduction: 10 reps   Modalities Modalities: Cryotherapy Cryotherapy Number Minutes Cryotherapy: 10 Minutes Cryotherapy Location: Knee (Bilateral) Type of Cryotherapy: Ice pack  Physical Therapy Assessment and Plan PT Assessment and Plan Clinical Impression Statement: Tx limited by pain in bilateral knees. Standing exercises held this session secondary to pain. Pt requires multimodal cueing to faciliate distal quad contractuon with quad set. Pt also reuires multimodal cueing to avoid external rotation with side lying abduction. PT Plan: Resume standing exercises as able.     Problem List Patient Active Problem List   Diagnosis Date Noted  . Falls 05/26/2013  . Difficulty in walking(719.7) 05/26/2013  . Bilateral leg weakness 05/26/2013  . Recurrent falls 05/25/2013  . Generalized osteoarthritis 05/25/2013  . Head trauma 04/29/2013  . Headache(784.0) 04/29/2013  . Routine general medical examination at a health care facility 12/07/2012  . Dysphagia 04/03/2012  . Abnormal chest CT 03/02/2012  . Anxiety 01/02/2012  . Insomnia 01/02/2012  . Hypertension goal BP (blood pressure) < 130/80 05/30/2011  . Schatzki's ring 05/07/2011  . THYROID FUNCTION TEST,  ABNORMAL 02/01/2011  . CONSTIPATION 01/29/2011  . NAUSEA 01/29/2011  . INGROWN TOENAIL 01/10/2011  . BACK PAIN 10/15/2010  . GLAUCOMA 08/08/2010  . DYSCHROMIA, UNSPECIFIED 08/08/2010  . FATIGUE 03/27/2010  . ANEMIA 12/01/2009  . CHRONIC KIDNEY DISEASE UNSPECIFIED 09/14/2009  . HIP PAIN, LEFT 06/09/2009  . UNSPECIFIED URINARY INCONTINENCE 06/09/2009  . OBESITY, UNSPECIFIED 03/06/2009  . NEOPLASMS UNSPEC NATURE BONE SOFT TISSUE&SKIN 01/24/2009  . KNEE PAIN 11/24/2008  . Anemia of other chronic disease 10/02/2008  . HYPOTHYROIDISM 09/27/2008  . HYPERLIPIDEMIA 06/30/2008  . DEPRESSION 06/30/2008  . GERD 06/30/2008  . SLEEP APNEA 06/30/2008  . LEG EDEMA, BILATERAL 06/30/2008  . CARPAL TUNNEL SYNDROME 05/19/2008  . TOTAL KNEE FOLLOW-UP 05/19/2008  . SHOULDER PAIN 02/02/2008  . IMPINGEMENT SYNDROME 02/02/2008  . DEGENERATIVE JOINT DISEASE, KNEE 11/24/2007  . Diabetes mellitus, insulin dependent (IDDM), uncontrolled 09/09/2007    PT - End of Session Activity Tolerance: Patient tolerated treatment well;Patient limited by pain General Behavior During Therapy: [facility] for tasks assessed/performed Cognition: WFL for tasks performed  Seth Bake, PTA  06/11/2013, 4:11 PM

## 2013-06-16 ENCOUNTER — Ambulatory Visit (HOSPITAL_COMMUNITY)
Admission: RE | Admit: 2013-06-16 | Discharge: 2013-06-16 | Disposition: A | Discharge status: Home / Self Care | Payer: Medicare Other | Source: Ambulatory Visit | Attending: Family Medicine | Admitting: Family Medicine

## 2013-06-16 DIAGNOSIS — R262 Difficulty in walking, not elsewhere classified: Secondary | ICD-10-CM

## 2013-06-16 DIAGNOSIS — R29898 Other symptoms and signs involving the musculoskeletal system: Secondary | ICD-10-CM

## 2013-06-16 NOTE — Evaluation (Signed)
Physical Therapy Reassessment Patient Details  Name: [name] MRN: [id number] Date of Birth: 1941/01/06  Today's Date: 06/16/2013 Time: 4098-1191 PT Time Calculation (min): 30 min              Visit#: 4 of 12  Re-eval: 07/16/13 Assessment Diagnosis: knee, back and shoulder pain Next MD Visit: 08/2013  Authorization: [facility] medicare    Authorization Time Period:    Authorization Visit#: 4 of 10   Past Medical History:  Past Medical History  Diagnosis Date  . Diabetes mellitus   . Hypertension   . Tobacco abuse   . Myasthenia gravis   . Arthritis   . Gastroesophageal reflux   . Thyroid disease   . Sleep apnea   . Iron deficiency anemia   . Depression   . Hyperlipidemia   . Obesity   . Carpal tunnel syndrome   . Shoulder pain   . Impingement syndrome, shoulder   . Pulmonary HTN   . Mitral regurgitation   . Schatzki's ring     Last EGD w/ dilation 02/08/11, 2009 & 2007  . Esophagitis, erosive 2009  . Diverticulosis 07/2004    Colonscopy Dr Jena Gauss   Past Surgical History:  Past Surgical History  Procedure Laterality Date  . Rt tka  05/13/07    Dr. Romeo Apple  . Right carpal tunnel release  02/01/2009  . Incontinence surgery  08/26/09    Tananbaum  . Cholecystectomy    . Abdominal hysterectomy    . Eye surgery    . Esophagogastroduodenoscopy  02/08/11    Rourk-Distal esophageal erosion consistent with mild erosive reflux   esophagitis/ Noncritical Schatzki ring, small hiatal hernia otherwise upper/ gastrointestinal tract appeared unremarkable, status post passage  of a Maloney dilation to biopsy disruption of the ring described  . Colonoscopy  05/08/2012    Procedure: COLONOSCOPY;  Surgeon: Corbin Ade, MD;  Location: [facility];  Service: Endoscopy;  Laterality: N/A;  8:30  . Joint replacement      right total knee    Subjective Symptoms/Limitations Symptoms: Pt state she is nauseated.   Pertinent History:  crohn dz ; Myo gravis. How long can you sit  comfortably?: two hours. How long can you stand comfortably?: Back prohibits her form standing. How long can you walk comfortably?: Pt is walking with a cane.  The first treatment therapist and pt talked about the importance of walking but she has not started walking yet.   Pain Assessment Currently in Pain?: No/denies (past week the pain has been as high as an 8 or 9) Pain Score: 0-No pain Pain Location: Knee Pain Orientation: Right Pain Type: Chronic pain Pain Onset: More than a month ago Pain Frequency: Intermittent   Balance Screening Balance Screen Has the patient fallen in the past 6 months: Yes How many times?: 26 Pt fell last week-no injury Has the patient had a decrease in activity level because of a fear of falling? : Yes Is the patient reluctant to leave their home because of a fear of falling? : Yes   Sensation/Coordination/Flexibility/Functional Tests Functional Tests Functional Tests: 5 sit to stand in 30 seconds was 4 B UE assist  Assessment RUE AROM (degrees) RUE Overall AROM Comments:  (WNL) RUE Strength Right Shoulder Flexion: 4/5 Right Shoulder ABduction: 4/5 Right Shoulder Internal Rotation: 3/5 Right Shoulder External Rotation: 3/5 LUE AROM (degrees) LUE Overall AROM Comments:  (wnl) LUE Strength LUE Overall Strength Comments: Flex/Ab 4/5 ; IR/ER 3/5 RLE Strength Right Hip  Flexion: 3+/5 (was 3-/5) Right Hip Extension: 3/5 (was 3-/5) Right Hip ABduction: 3+/5 (2was 3-/5) Right Knee Flexion: 5/5 Right Knee Extension: 4/5 (was 3+/5) Right Ankle Dorsiflexion: 3+/5 (was 3/5) Right Ankle Plantar Flexion: 3-/5 LLE Strength Left Hip Flexion:  (4+/5 was 3-/5) Left Hip Extension: 3/5 (was 3-) Left Hip ABduction: 3/5 (was 3-/5) Left Knee Flexion: 5/5 Left Knee Extension:  (4+/5 was a 4/5) Left Ankle Dorsiflexion: 3+/5 (was 3/5) Left Ankle Plantar Flexion: 3-/5  Exercise/Treatments   Aerobic Stationary Bike: Nu-step x 7' hills level 3    Supine Straight Leg Raises: 10 reps Sidelying Hip ABduction: 10 reps Hip ADduction: 10 reps Prone  Hamstring Curl: 10 reps Hip Extension: 10 reps   Physical Therapy Assessment and Plan PT Assessment and Plan Clinical Impression Statement: Pt has had a recent fall with no injury. Pt continues to gain in strength.  B hip extensors are the weakest. PT Frequency: Min 2X/week PT Duration: 4 weeks PT Plan: Resume standing exercises    Goals Home Exercise Program Pt will Perform Home Exercise Program: Independently PT Short Term Goals Time to Complete Short Term Goals: 2 weeks PT Short Term Goal 1: Pt strength to be increased by 1/2 grade to allow pt to be able to stand for 15 minutes at a time to be able to complete her dishes while standing. PT Short Term Goal 1 - Progress: Progressing toward goal PT Short Term Goal 2: Pt pain level to be no greater than a 5/10 70% of the day. PT Short Term Goal 2 - Progress: Met PT Short Term Goal 3: Pt to be ambutlating with a cane. PT Short Term Goal 3 - Progress: Not met PT Long Term Goals Time to Complete Long Term Goals: 4 weeks PT Long Term Goal 1: Pt to be I in advanced HEP PT Long Term Goal 2: Pt strength to be incrreased by 1 grade to allow pt to be able to walk for 20 minutes without fear of falling PT Long Term Goal 2 - Progress: Not met Long Term Goal 3: no fallis for the past  seven days. Long Term Goal 3 Progress: Not met Long Term Goal 4: Pain no greater than a 3/10 70% of the day. Long Term Goal 4 Progress: Not met PT Long Term Goal 5: Pt to be able to do her housework  Long Term Goal 5 Progress: Progressing toward goal  Problem List Patient Active Problem List   Diagnosis Date Noted  . Falls 05/26/2013  . Difficulty in walking(719.7) 05/26/2013  . Bilateral leg weakness 05/26/2013  . Recurrent falls 05/25/2013  . Generalized osteoarthritis 05/25/2013  . Head trauma 04/29/2013  . Headache(784.0) 04/29/2013  . Routine  general medical examination at a health care facility 12/07/2012  . Dysphagia 04/03/2012  . Abnormal chest CT 03/02/2012  . Anxiety 01/02/2012  . Insomnia 01/02/2012  . Hypertension goal BP (blood pressure) < 130/80 05/30/2011  . Schatzki's ring 05/07/2011  . THYROID FUNCTION TEST, ABNORMAL 02/01/2011  . CONSTIPATION 01/29/2011  . NAUSEA 01/29/2011  . INGROWN TOENAIL 01/10/2011  . BACK PAIN 10/15/2010  . GLAUCOMA 08/08/2010  . DYSCHROMIA, UNSPECIFIED 08/08/2010  . FATIGUE 03/27/2010  . ANEMIA 12/01/2009  . CHRONIC KIDNEY DISEASE UNSPECIFIED 09/14/2009  . HIP PAIN, LEFT 06/09/2009  . UNSPECIFIED URINARY INCONTINENCE 06/09/2009  . OBESITY, UNSPECIFIED 03/06/2009  . NEOPLASMS UNSPEC NATURE BONE SOFT TISSUE&SKIN 01/24/2009  . KNEE PAIN 11/24/2008  . Anemia of other chronic disease 10/02/2008  . HYPOTHYROIDISM 09/27/2008  .  HYPERLIPIDEMIA 06/30/2008  . DEPRESSION 06/30/2008  . GERD 06/30/2008  . SLEEP APNEA 06/30/2008  . LEG EDEMA, BILATERAL 06/30/2008  . CARPAL TUNNEL SYNDROME 05/19/2008  . TOTAL KNEE FOLLOW-UP 05/19/2008  . SHOULDER PAIN 02/02/2008  . IMPINGEMENT SYNDROME 02/02/2008  . DEGENERATIVE JOINT DISEASE, KNEE 11/24/2007  . Diabetes mellitus, insulin dependent (IDDM), uncontrolled 09/09/2007    General Behavior During Therapy: [facility] for tasks assessed/performed Cognition: [facility] for tasks performed PT Plan of Care PT Home Exercise Plan: given  GP    RUSSELL,CINDY 06/16/2013, 4:45 PM  Physician Documentation Your signature is required to indicate approval of the treatment plan as stated above.  Please sign and either send electronically or make a copy of this report for your files and return this physician signed original.   Please mark one 1.__approve of plan  2. ___approve of plan with the following conditions.   ______________________________                                                          _____________________ Physician Signature                                                                                                              Date

## 2013-06-18 ENCOUNTER — Ambulatory Visit (HOSPITAL_COMMUNITY)
Admission: RE | Admit: 2013-06-18 | Discharge: 2013-06-18 | Disposition: A | Discharge status: Home / Self Care | Payer: Medicare Other | Source: Ambulatory Visit | Attending: Family Medicine | Admitting: Family Medicine

## 2013-06-18 NOTE — Progress Notes (Signed)
Physical Therapy Treatment Patient Details  Name: [name] MRN: [id number] Date of Birth: 13-May-1941  Today's Date: 06/18/2013 Time: 4098-1191 PT Time Calculation (min): 39 min  Visit#: 5 of 12  Re-eval: 06/25/13 Authorization: [employer] medicare  Authorization Visit#: 5 of 10  Charges:  therex 38'  Subjective: Symptoms/Limitations Symptoms: Pt. reports she's continued to be nauseated; unable to keep food or fluids down; thinks theres something wrong with her esophagus and plans on seeing her MD. Pain Assessment Currently in Pain?: Yes Pain Score:   6 Pain Location: Knee Pain Orientation: Right   Exercise/Treatments Aerobic Stationary Bike: Nu-step x 8' hills level 2 Standing Heel Raises: 10 reps;Limitations Heel Raises Limitations: toeraises 10 reps Seated Long Arc Quad: 15 reps Supine Straight Leg Raises: 10 reps;Both Sidelying Hip ABduction: 10 reps;Both Hip ADduction: 10 reps;Both Prone  Hamstring Curl: 10 reps Hip Extension: 10 reps;Both   Modalities Modalities: Cryotherapy Cryotherapy Cryotherapy Location: Knee (Bilateral) Type of Cryotherapy: Ice pack  Physical Therapy Assessment and Plan PT Assessment and Plan Clinical Impression Statement: Pt with decreased energy today, however able to complete all exercises. Noted difficulty with SLR due to weakness.   Urged pt to return to MD or see GI doctor about stomach issues. PT Plan: Progress standing exercises     Problem List Patient Active Problem List   Diagnosis Date Noted  . Falls 05/26/2013  . Difficulty in walking(719.7) 05/26/2013  . Bilateral leg weakness 05/26/2013  . Recurrent falls 05/25/2013  . Generalized osteoarthritis 05/25/2013  . Head trauma 04/29/2013  . Headache(784.0) 04/29/2013  . Routine general medical examination at a health care facility 12/07/2012  . Dysphagia 04/03/2012  . Abnormal chest CT 03/02/2012  . Anxiety 01/02/2012  . Insomnia 01/02/2012  . Hypertension goal  BP (blood pressure) < 130/80 05/30/2011  . Schatzki's ring 05/07/2011  . THYROID FUNCTION TEST, ABNORMAL 02/01/2011  . CONSTIPATION 01/29/2011  . NAUSEA 01/29/2011  . INGROWN TOENAIL 01/10/2011  . BACK PAIN 10/15/2010  . GLAUCOMA 08/08/2010  . DYSCHROMIA, UNSPECIFIED 08/08/2010  . FATIGUE 03/27/2010  . ANEMIA 12/01/2009  . CHRONIC KIDNEY DISEASE UNSPECIFIED 09/14/2009  . HIP PAIN, LEFT 06/09/2009  . UNSPECIFIED URINARY INCONTINENCE 06/09/2009  . OBESITY, UNSPECIFIED 03/06/2009  . NEOPLASMS UNSPEC NATURE BONE SOFT TISSUE&SKIN 01/24/2009  . KNEE PAIN 11/24/2008  . Anemia of other chronic disease 10/02/2008  . HYPOTHYROIDISM 09/27/2008  . HYPERLIPIDEMIA 06/30/2008  . DEPRESSION 06/30/2008  . GERD 06/30/2008  . SLEEP APNEA 06/30/2008  . LEG EDEMA, BILATERAL 06/30/2008  . CARPAL TUNNEL SYNDROME 05/19/2008  . TOTAL KNEE FOLLOW-UP 05/19/2008  . SHOULDER PAIN 02/02/2008  . IMPINGEMENT SYNDROME 02/02/2008  . DEGENERATIVE JOINT DISEASE, KNEE 11/24/2007  . Diabetes mellitus, insulin dependent (IDDM), uncontrolled 09/09/2007      Lurena Nida, PTA/CLT 06/18/2013, 3:25 PM

## 2013-06-23 ENCOUNTER — Telehealth (HOSPITAL_COMMUNITY): Payer: Self-pay

## 2013-06-23 ENCOUNTER — Inpatient Hospital Stay (HOSPITAL_COMMUNITY): Admission: RE | Admit: 2013-06-23 | Payer: Medicare Other | Source: Ambulatory Visit | Admitting: *Deleted

## 2013-06-25 ENCOUNTER — Ambulatory Visit (HOSPITAL_COMMUNITY)
Admission: RE | Admit: 2013-06-25 | Discharge: 2013-06-25 | Disposition: A | Discharge status: Home / Self Care | Payer: Medicare Other | Source: Ambulatory Visit | Attending: *Deleted | Admitting: *Deleted

## 2013-06-25 ENCOUNTER — Other Ambulatory Visit: Payer: Self-pay | Admitting: Family Medicine

## 2013-06-25 NOTE — Progress Notes (Signed)
Physical Therapy Treatment Patient Details  Name: [name] MRN: [id number] Date of Birth: 1941/01/05  Today's Date: 06/25/2013 Time: 1710-1741 PT Time Calculation (min): 31 min  Visit#: 6 of 12  Re-eval: 07/14/13 Charges: Therex x 20' (1710-1730) Ice x 10' [phone])  Authorization: [employer] medicare  Authorization Visit#: 6 of 10   Subjective: Symptoms/Limitations Symptoms: Pt states that both of her legs are swollen. Pain Assessment Currently in Pain?: Yes Pain Score:   6 Pain Location: Knee Pain Orientation: Right   Exercise/Treatments Standing Heel Raises: 10 reps;Limitations Heel Raises Limitations: toeraises 10 reps Supine Hip Adduction Isometric: 10 reps Bridges: 10 reps Straight Leg Raises: 10 reps Sidelying Hip ABduction: 10 reps;Both Hip ADduction: 10 reps;Both   Modalities Modalities: Cryotherapy Cryotherapy Number Minutes Cryotherapy: 10 Minutes Cryotherapy Location: Knee (Right) Type of Cryotherapy: Ice pack  Physical Therapy Assessment and Plan PT Assessment and Plan Clinical Impression Statement: Tx limited by time as pt was 32' late for appointment. Pt complains of swelling in BLE and pain in right knee. Pt requires multimodal cueing to improve form with side lying hip abduction and adduction.  Ice applied to right knee with BLE elevated at end of session to decrease pain and swelling.  PT Plan: Continue to progress strength/stability and decrease pain per PT POC.      Problem List Patient Active Problem List   Diagnosis Date Noted  . Falls 05/26/2013  . Difficulty in walking(719.7) 05/26/2013  . Bilateral leg weakness 05/26/2013  . Recurrent falls 05/25/2013  . Generalized osteoarthritis 05/25/2013  . Head trauma 04/29/2013  . Headache(784.0) 04/29/2013  . Routine general medical examination at a health care facility 12/07/2012  . Dysphagia 04/03/2012  . Abnormal chest CT 03/02/2012  . Anxiety 01/02/2012  . Insomnia 01/02/2012  .  Hypertension goal BP (blood pressure) < 130/80 05/30/2011  . Schatzki's ring 05/07/2011  . THYROID FUNCTION TEST, ABNORMAL 02/01/2011  . CONSTIPATION 01/29/2011  . NAUSEA 01/29/2011  . INGROWN TOENAIL 01/10/2011  . BACK PAIN 10/15/2010  . GLAUCOMA 08/08/2010  . DYSCHROMIA, UNSPECIFIED 08/08/2010  . FATIGUE 03/27/2010  . ANEMIA 12/01/2009  . CHRONIC KIDNEY DISEASE UNSPECIFIED 09/14/2009  . HIP PAIN, LEFT 06/09/2009  . UNSPECIFIED URINARY INCONTINENCE 06/09/2009  . OBESITY, UNSPECIFIED 03/06/2009  . NEOPLASMS UNSPEC NATURE BONE SOFT TISSUE&SKIN 01/24/2009  . KNEE PAIN 11/24/2008  . Anemia of other chronic disease 10/02/2008  . HYPOTHYROIDISM 09/27/2008  . HYPERLIPIDEMIA 06/30/2008  . DEPRESSION 06/30/2008  . GERD 06/30/2008  . SLEEP APNEA 06/30/2008  . LEG EDEMA, BILATERAL 06/30/2008  . CARPAL TUNNEL SYNDROME 05/19/2008  . TOTAL KNEE FOLLOW-UP 05/19/2008  . SHOULDER PAIN 02/02/2008  . IMPINGEMENT SYNDROME 02/02/2008  . DEGENERATIVE JOINT DISEASE, KNEE 11/24/2007  . Diabetes mellitus, insulin dependent (IDDM), uncontrolled 09/09/2007    PT - End of Session Activity Tolerance: Patient tolerated treatment well General Behavior During Therapy: [facility] for tasks assessed/performed Cognition: WFL for tasks performed  Seth Bake, PTA  06/25/2013, 5:43 PM

## 2013-07-29 ENCOUNTER — Other Ambulatory Visit: Payer: Self-pay | Admitting: Family Medicine

## 2013-07-31 ENCOUNTER — Other Ambulatory Visit: Payer: Self-pay | Admitting: Family Medicine

## 2013-08-12 ENCOUNTER — Encounter: Payer: Medicare Other | Admitting: Family Medicine

## 2013-08-12 ENCOUNTER — Ambulatory Visit (INDEPENDENT_AMBULATORY_CARE_PROVIDER_SITE_OTHER): Payer: Medicare Other | Admitting: Family Medicine

## 2013-08-12 ENCOUNTER — Encounter: Payer: Self-pay | Admitting: Family Medicine

## 2013-08-12 VITALS — BP 142/76 | HR 60 | Resp 18 | Ht 66.0 in | Wt 256.0 lb

## 2013-08-12 DIAGNOSIS — Z1211 Encounter for screening for malignant neoplasm of colon: Secondary | ICD-10-CM

## 2013-08-12 DIAGNOSIS — E1129 Type 2 diabetes mellitus with other diabetic kidney complication: Secondary | ICD-10-CM

## 2013-08-12 DIAGNOSIS — IMO0001 Reserved for inherently not codable concepts without codable children: Secondary | ICD-10-CM

## 2013-08-12 DIAGNOSIS — R32 Unspecified urinary incontinence: Secondary | ICD-10-CM

## 2013-08-12 DIAGNOSIS — E039 Hypothyroidism, unspecified: Secondary | ICD-10-CM

## 2013-08-12 DIAGNOSIS — E1065 Type 1 diabetes mellitus with hyperglycemia: Secondary | ICD-10-CM

## 2013-08-12 DIAGNOSIS — I10 Essential (primary) hypertension: Secondary | ICD-10-CM

## 2013-08-12 DIAGNOSIS — E785 Hyperlipidemia, unspecified: Secondary | ICD-10-CM

## 2013-08-12 DIAGNOSIS — E669 Obesity, unspecified: Secondary | ICD-10-CM

## 2013-08-12 DIAGNOSIS — E1165 Type 2 diabetes mellitus with hyperglycemia: Secondary | ICD-10-CM

## 2013-08-12 DIAGNOSIS — N390 Urinary tract infection, site not specified: Secondary | ICD-10-CM

## 2013-08-12 LAB — POCT URINALYSIS DIPSTICK
Bilirubin, UA: NEGATIVE
Nitrite, UA: POSITIVE
pH, UA: 7

## 2013-08-12 MED ORDER — CIPROFLOXACIN HCL 500 MG PO TABS: 500.0000 mg | ORAL_TABLET | Freq: Two times a day (BID) | ORAL | Status: AC

## 2013-08-12 NOTE — Patient Instructions (Addendum)
F/u in 4 months, call if you need ,me before  Flu vaccine available in October, pls call for this   HBA1C, cmp  And EGFr, lipid today   HBA1C, and chem7 and TSH in 4 month, before next visit  Weight loss goal of 1.5 pounds per month  Referrals are entered for eye xam and with urologist   Rectal exam today  You have lost 20 pounds in the last 18 months, congratulations!

## 2013-08-12 NOTE — Progress Notes (Signed)
  Subjective:    Patient ID: [name], female    DOB: December 22, 1941, 72 y.o.   MRN: [id number]  HPI The PT is here for follow up and re-evaluation of chronic medical conditions, medication management and review of any available recent lab and radiology data.  Preventive health is updated, specifically  Cancer screening and Immunization.   Questions or concerns regarding consultations or procedures which the PT has had in the interim are  addressed. The PT denies any adverse reactions to current medications since the last visit.  C/o reduced vision , eye exam past due and she will go for this    Review of Systems    See HPI Denies recent fever or chills. Denies sinus pressure, nasal congestion, ear pain or sore throat. Denies chest congestion, productive cough or wheezing. Denies chest pains, palpitations and leg swelling Denies abdominal pain, nausea, vomiting,diarrhea or constipation.   C/o worsened and uncontrolled urinary incontinence requests urology re eval Denies uncontrolled joint pain, swelling and limitation in mobility.DStill has limitation in mobility due to severe osteoarthirtis Denies headaches, seizures, numbness, or tingling. Denies uncontrolled  depression, anxiety or insomnia.Improved since getting a dog Denies skin break down or rash.     Objective:   Physical Exam Patient alert and oriented and in no cardiopulmonary distress.  HEENT: No facial asymmetry, EOMI, no sinus tenderness,  oropharynx pink and moist.  Neck supple no adenopathy.  Chest: Clear to auscultation bilaterally.  CVS: S1, S2 no murmurs, no S3.  ABD: Soft non tender. Bowel sounds normal. Rectal : no mass, heme negative stool Ext: No edema  MS: decreased  ROM spine, shoulders, hips and knees.  Skin: Intact, no ulcerations or rash noted.  Psych: Good eye contact, normal affect. Memory intact not anxious or depressed appearing.  CNS: CN 2-12 intact, power, tone and sensation normal  throughout.        Assessment & Plan:

## 2013-08-13 LAB — COMPLETE METABOLIC PANEL WITH GFR
AST: 9 U/L (ref 0–37)
Albumin: 3.9 g/dL (ref 3.5–5.2)
BUN: 14 mg/dL (ref 6–23)
Calcium: 9.6 mg/dL (ref 8.4–10.5)
Chloride: 104 mEq/L (ref 96–112)
Glucose, Bld: 49 mg/dL — ABNORMAL LOW (ref 70–99)
Potassium: 4.6 mEq/L (ref 3.5–5.3)
Total Protein: 6.9 g/dL (ref 6.0–8.3)

## 2013-08-13 LAB — HEMOGLOBIN A1C: Hgb A1c MFr Bld: 7.2 % — ABNORMAL HIGH (ref <5.7)

## 2013-08-13 LAB — LIPID PANEL
HDL: 63 mg/dL (ref >39)
LDL Cholesterol: 84 mg/dL (ref 0–99)

## 2013-08-14 LAB — URINE CULTURE

## 2013-08-27 NOTE — Assessment & Plan Note (Signed)
Controlled, no change in medication Hyperlipidemia:Low fat diet discussed and encouraged.  \ 

## 2013-08-27 NOTE — Assessment & Plan Note (Signed)
Deteriorate will see urologist again

## 2013-08-27 NOTE — Assessment & Plan Note (Signed)
Improved. Pt applauded on succesful weight loss through lifestyle change, and encouraged to continue same. Weight loss goal set for the next several months.  

## 2013-08-27 NOTE — Assessment & Plan Note (Signed)
Controlled, no change in medication  

## 2013-08-27 NOTE — Assessment & Plan Note (Signed)
marked improvemnt, pt congratulated Patient advised to reduce carb and sweets, commit to regular physical activity, take meds as prescribed, test blood as directed, and attempt to lose weight, to improve blood sugar control.

## 2013-08-31 ENCOUNTER — Other Ambulatory Visit: Payer: Self-pay | Admitting: Family Medicine

## 2013-09-01 ENCOUNTER — Other Ambulatory Visit: Payer: Self-pay | Admitting: *Deleted

## 2013-09-01 DIAGNOSIS — M1710 Unilateral primary osteoarthritis, unspecified knee: Secondary | ICD-10-CM

## 2013-09-01 MED ORDER — GABAPENTIN 300 MG PO CAPS: 300.0000 mg | ORAL_CAPSULE | Freq: Three times a day (TID) | ORAL | Status: DC

## 2013-10-01 ENCOUNTER — Other Ambulatory Visit: Payer: Self-pay | Admitting: Family Medicine

## 2013-10-15 ENCOUNTER — Other Ambulatory Visit: Payer: Self-pay | Admitting: Family Medicine

## 2013-10-15 ENCOUNTER — Other Ambulatory Visit: Payer: Self-pay | Admitting: Ophthalmology

## 2013-10-15 MED ORDER — TROPICAMIDE 1 % OP SOLN: 1.0000 [drp] | OPHTHALMIC | Status: DC

## 2013-10-15 NOTE — H&P (Signed)
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  The patient ran out of Combigan approximately 1 month ago      HPI: EYES: Reports symptoms of  patient cannot see well to drive do housework perform daily activities.  She states that vision, right eye.  Never improved following cataract surgery.    LOCATION:   BOTH EYES        QUALITY/COURSE:   Reports condition is worsening.        INTENSITY/SEVERITY:    Reports measurement ( or degree) as  severe   DURATION:   Reports the general length of symptoms to be months.      ONSET/TIMING:   Reports occurrence as   CONTEXT/WHEN:   Reports usually associated with   MODIFIERS/TREATMENTS:  Improved by              ROS:   GEN- Constitutional: HENT:  Dry eyes, cataract GEN - Endocrine: Reports symptoms of diabetes.    LUNGS/Respiratory:  HEART/Cardiovascular: Reports symptoms of hypertension.    ABD/Gastrointestinal:   Musculoskeletal (BJE): NEURO/Neurological: PSYCH/Psychiatric:    Is the pt oriented to time, place, person? 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  Epiretinal membrane, right eye Starter - Active Problems:   Blurring, visual   ICD#368.8  Onset: 09/11/2013 12:08  Initial Date:   Primary open angle glaucoma   ICD#365.11  Onset: 09/11/2013 12:08  Initial Date:    Benign hypertension   ICD#401.1  Onset: 09/11/2013 12:08  Initial Date:        Diabetes - Type 2 - unspecified   ICD#250.00  Onset: 09/11/2013 12:08  Initial  Date:     Diabetic retinopathy, nonproliferative, moderate   ICD#362.05 Onset: 09/11/2013 13:59 Initial Date:  SURGERIES: Pick List - Surgeries  MEDICATIONS: Aspirin:  81 mg tablet  SIG-  1 each   once a day   Metformin (Glucophage):   500 mg tablet  SIG-  1 each    2 times a day    Combigan: 0.2%-0.5% solution SIG-  1 gtt in each affected eye every 12 hours for 30 days   Restasis (Cyclosporine): 0.05% emulsion SIG-  1 gtt in each affected eye every 12 hours for 30 days  REVIEW OF SYSTEMS: not found  TOBACCO: Never smoker   ICD#V13.89 Onset: 09/11/2013 12:09 Initial Date:   Tobacco use:     Tobacco cessation:  SOCIAL HISTORY: Single.  FAMILY HISTORY: Positive family history for  -   Diabetes - Type 2:  ;   Glaucoma:;   Hypertension:   Negative family history for  -   PARENTS: CHILDREN: Glaucoma:;   Hypertension:   son GRANDPARENTS: SIBLINGS: UNCLES/AUNTS:       Family History - 1st Degree Relatives:  Mother dead.  ALLERGIES: Drug Allergies.  No Known.  PHYSICAL EXAMINATION:  VS: BMI: 41.7.  BP: 130/70.  H: 65.00 in.  RR: 20 /min.  W: 250lbs 0oz.    Va     OD [place] 20/CF   phni   OS [place] 20/200  phni         EYEGLASSES: LOST OD                                              OS ADD:  Auto Refraction OD:+0.25+0.50x163 OS:-2.25+1.50x006  K's:09/11/2013 12:24  constricted OD:40.75 41.25 41.00 OS:41.00,41.50,41.25  MR: OD: NI OS: NI ADD  VF:   OD: Constricted                                             OS: Constricted  Motility orthophoria and full  PUPILS: 3 mm round reactive negative Marcus Gunn  EYELIDS & OCULAR ADNEXA  infraorbital hyperpigmentation  SLE: Conjunctiva superior scar OD quiet each eye  Cornea arcus each eye with decrease tear film   anterior chamber  deep and quiet each eye  Iris brown no vessels either eye  Lens posterior chamber intraocular lens implant OD +3 nuclear sclerosis OS  Vitreous  CCT  Ta   in mmHg    OD  18           OS 18 Time 1:00 PM  Gonio   Dilation Tropicamide 1% phenylephrine 2.5%  Fundus:  optic nerve  OD  40% cup slight temporal discoloration SHUNT vessels noted                                                OS 40% cupping rim intact   Macula:    Epiretinal membrane   OD:    Epiretinal membrane                                                 OS: Irregular light reflex  Vessels: Microaneurysms and dot hemorrhages noted OD Narrow arterioles each eye  Periphery: Superior panretinal photocoagulation along the superior arcade OD     Exam: GENERAL: Appearance: General appearance can be described as well-nourished, well-developed, and in no acute distress.    LYMPHATIC: HEAD, EARS, NOSE AND THROAT: Ears-Nose (external) Inspection: Externally, nose and ears are normal in appearance and without scars, lesions, or nodules.      Otoscopic Exam: External auditory canals and tympanic membranes are normal.      Hearing assessment shows no problems with normal conversation.    Nose exam, internally, reveals nasal mucosa, septum and turbinates are unremarkable.    Teeth, Gingiva, and Lip Exams: No lesions or evidence of infection.      Oropharynx demonstrates oral mucosa, salivary glands, tongue, tonsils, posterior pharynx, hard-soft palates are normal.  EYES: see above  NECK: Neck tissue exam demonstrates no masses, symmetrical, and trachea is midline.      LUNGS and RESPIRATORY: Lung auscultation elicits no wheezing, rhonci, rales or rubs and with equal breath sounds.  Respiratory effort described as breathing is unlabored and chest movement is symmetrical.    HEART (Cardiovascular): Heart auscultation discovers  grade 2/6 murmur left upper sternal border ABDOMEN (Gastrointestinal): Mass/Tenderness Exam: Neither are present.     Liver/Spleen: No hepatomegaly or splenomegaly.   MUSCULOSKELETAL (BJE): Inspection-Palpation: No major bone, joint, tendon, or muscle changes.       NEUROLOGICAL: Alert and oriented. No major deficits of coordination or sensation.      PSYCHIATRIC: Insight and judgment appear  both to be intact and appropriate.    Mood and affect are described as normal mood and full affect.    SKIN: Skin Inspection: No rashes or lesions.  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  Epiretinal membrane, right eye Starter - Active Problems:   Blurring, visual   ICD#368.8  Onset: 09/11/2013 12:08  Initial Date:   Primary open angle glaucoma   ICD#365.11  Onset: 09/11/2013 12:08  Initial Date:    Benign hypertension   ICD#401.1  Onset: 09/11/2013 12:08  Initial Date:        Diabetes - Type 2 - unspecified   ICD#250.00  Onset: 09/11/2013 12:08  Initial Date:     Diabetic retinopathy, nonproliferative, moderate   ICD#362.05 Onset: 09/11/2013 13:59 Initial Date:  SURGICAL TREATMENT PLAN: phacoemulsification with intraocular lens implant, left eye.  Risk and benefits of surgery have been reviewed with the patient and the patient agrees to proceed with the surgical procedure.   Restart Combigan each eye twice a day DILATED MACULAR/FUNDUS XM COMMUNJ TX PHYS/QHP CPT#5010F Related Dxs-  Modifiers-     EYE EXAM WITH PHOTOS.   ZOX#[id number] Related Dxs-  Modifiers-   Dilated fundus eval done CPT#2020F Related Dxs-  Modifiers-      Actions:     Handouts: Hypertension, glaucoma , what is glaucoma?, glaucoma treatment.   ECHO EXAM OF EYE.      VWU#[id number]  Related Dxs-  Modifiers-    :  Old Records Requested:  Discuss with physician:  Summary updated:   Second Interpretation:    ___________________________ Chalmers Guest, Tawni Millers - Inactive Problems:

## 2013-10-16 ENCOUNTER — Encounter (HOSPITAL_COMMUNITY)
Admission: RE | Admit: 2013-10-16 | Discharge: 2013-10-16 | Disposition: A | Discharge status: Home / Self Care | Payer: Medicare Other | Source: Ambulatory Visit | Attending: Anesthesiology | Admitting: Anesthesiology

## 2013-10-16 ENCOUNTER — Encounter (HOSPITAL_COMMUNITY): Payer: Self-pay

## 2013-10-16 ENCOUNTER — Encounter (HOSPITAL_COMMUNITY)
Admission: RE | Admit: 2013-10-16 | Discharge: 2013-10-16 | Disposition: A | Discharge status: Home / Self Care | Payer: Medicare Other | Source: Ambulatory Visit | Attending: Ophthalmology | Admitting: Ophthalmology

## 2013-10-16 DIAGNOSIS — Z01818 Encounter for other preprocedural examination: Secondary | ICD-10-CM | POA: Insufficient documentation

## 2013-10-16 DIAGNOSIS — Z01812 Encounter for preprocedural laboratory examination: Secondary | ICD-10-CM | POA: Insufficient documentation

## 2013-10-16 HISTORY — DX: Headache: R51

## 2013-10-16 HISTORY — DX: Cardiac murmur, unspecified: R01.1

## 2013-10-16 HISTORY — DX: Pneumonia, unspecified organism: J18.9

## 2013-10-16 LAB — CBC
Hemoglobin: 9.3 g/dL — ABNORMAL LOW (ref 12.0–15.0)
MCH: 27.2 pg (ref 26.0–34.0)
MCV: 84.8 fL (ref 78.0–100.0)
RBC: 3.42 MIL/uL — ABNORMAL LOW (ref 3.87–5.11)
RDW: 16.7 % — ABNORMAL HIGH (ref 11.5–15.5)
WBC: 9.9 10*3/uL (ref 4.0–10.5)

## 2013-10-16 LAB — COMPREHENSIVE METABOLIC PANEL
ALT: 11 U/L (ref 0–35)
Alkaline Phosphatase: 127 U/L — ABNORMAL HIGH (ref 39–117)
CO2: 26 mEq/L (ref 19–32)
Calcium: 9.3 mg/dL (ref 8.4–10.5)
Chloride: 108 mEq/L (ref 96–112)
GFR calc Af Amer: 71 mL/min — ABNORMAL LOW (ref >90)
GFR calc non Af Amer: 61 mL/min — ABNORMAL LOW (ref >90)
Glucose, Bld: 129 mg/dL — ABNORMAL HIGH (ref 70–99)
Potassium: 4.8 mEq/L (ref 3.5–5.1)
Sodium: 143 mEq/L (ref 135–145)
Total Bilirubin: 0.2 mg/dL — ABNORMAL LOW (ref 0.3–1.2)

## 2013-10-16 NOTE — Progress Notes (Signed)
Pt chart forwarded to [place], [place] ( anesthesia) to review abnormal labs (hemoglobin and HCT).

## 2013-10-16 NOTE — Pre-Procedure Instructions (Addendum)
JULIANNY MILSTEIN  10/16/2013   Your procedure is scheduled on:  Wednesday, October 22nd   Report to [facility] Short Stay (use Main Entrance "A'') at 8:00 AM.             (FREE Valet Parking)  Call this number if you have problems the morning of surgery: [phone]   Remember:   Do not eat food or drink liquids after midnight Tuesday.   Take these medicines the morning of surgery with A SIP OF WATER: amLODipine (NORVASC) 5 MG  Tablet, citalopram (CELEXA) 40 MG tablet, diltiazem (CARDIZEM CD) 240 MG 24 hr capsule,   omeprazole (PRILOSEC) 20 MG capsule, venlafaxine XR (EFFEXOR-XR) 150 MG 24 hr capsule if needed:  clonazePAM (KLONOPIN) 0.5 MG tablet for anxiety, albuterol (PROAIR HFA) 108 (90 BASE) MCG/ACT inhaler For shortness of breath ( Bring inhaler in on the day of surgery)   Do not wear jewelry, make-up or nail polish.  Do not wear lotions, powders, or perfumes. You may wear deodorant.  Do not shave 48 hours prior to surgery.   Do not bring valuables to the hospital.  [facility] is not responsible  for any belongings or valuables.               Contacts, dentures or bridgework may not be worn into surgery.  Leave suitcase in the car. After surgery it may be brought to your room.               Patients discharged the day of surgery will not be allowed to drive home and you will need a responsible person to stay with you for the first 24 hrs afterwards.    Name and phone number of your driver:    Special Instructions: Shower using CHG 2 nights before surgery and the night before surgery.  If you shower the day of surgery use CHG.  Use special wash - you have one bottle of CHG for all showers.  You should use approximately 1/3 of the bottle for each shower.   Please read over the following fact sheets that you were given: Pain Booklet and Surgical Site Infection Prevention

## 2013-10-19 NOTE — Progress Notes (Signed)
Anesthesia Chart Review:  Patient is a 72 year old female scheduled for left eye cataract extraction with lens implant on 10/21/13 by Dr. Harlon Flor.  History includes morbid obesity (BMI 40), former smoker, HTN, DM2, myasthenia gravis (?date, but prior to 2008; on Mestinon and prednisone), depression, HLD, OSA, iron deficiency anemia, murmur (NL EF, trivial MR/TR; PA peak pressure 43mm Hg by 10/2009 echo), "thyroid disease" (not specified, but with normal TSH 04/29/13), right TKA '08, hysterectomy, tonsillectomy.  PCP is Dr. Syliva Overman.  EKG on 10/16/13 showed SB @ 58 bpm.  Echo on 11/08/09 showed: 1. Left ventricle: The cavity size was normal. Mild septal hypertrophy. Systolic function was vigorous. The estimated ejection fraction was in the range of 65% to 70%. Wall motion was normal; there were no regional wall motion abnormalities. 2. Left atrium: The atrium was mildly dilated. 3. Mitral valve: Trivial regurgitation. 4. Tricuspid valve: Trivial regurgitation. 5. Pulmonary arteries: PA peak pressure: 43mm Hg (S).  She has a history of a low risk stress test, but that was from 10/2004.  CXR on 10/16/13 showed: Central pulmonary vascular prominence without pulmonary edema. Calcified moderately tortuous aorta. Heart size top normal to minimally enlarged.   Preoperative labs noted.  H/H 9.3/29.0--slightly down from 09/06/29.3 on 04/14/13.  A1C on 08/12/13 was 7.2. She has a known history of anemia.  For this procedure, labs appear acceptable.   I did review MG history with anesthesiologist Dr. Michelle Piper.  Plan for patient to take prednisone and Mestinon on the day of surgery.  PAT RN [name] notified patient.  [name] 10/19/2013 5:32 PM

## 2013-10-19 NOTE — Progress Notes (Signed)
Pt advised to take predniSONE (DELTASONE) 5 MG tablet and pyridostigmine (MESTINON) 60 MG tablet in addition to morning medications listed on pt instruction sheet for DOS

## 2013-10-21 ENCOUNTER — Ambulatory Visit (HOSPITAL_COMMUNITY)
Admission: RE | Admit: 2013-10-21 | Discharge: 2013-10-21 | Disposition: A | Discharge status: Home / Self Care | Payer: Medicare Other | Source: Ambulatory Visit | Attending: Ophthalmology | Admitting: Ophthalmology

## 2013-10-21 ENCOUNTER — Encounter (HOSPITAL_COMMUNITY)
Admission: RE | Disposition: A | Discharge status: Home / Self Care | Payer: Self-pay | Source: Ambulatory Visit | Attending: Ophthalmology

## 2013-10-21 ENCOUNTER — Encounter (HOSPITAL_COMMUNITY): Payer: Medicare Other | Admitting: Vascular Surgery

## 2013-10-21 ENCOUNTER — Encounter (HOSPITAL_COMMUNITY): Payer: Self-pay | Admitting: *Deleted

## 2013-10-21 ENCOUNTER — Ambulatory Visit (HOSPITAL_COMMUNITY): Payer: Medicare Other | Admitting: Certified Registered"

## 2013-10-21 DIAGNOSIS — G7000 Myasthenia gravis without (acute) exacerbation: Secondary | ICD-10-CM | POA: Insufficient documentation

## 2013-10-21 DIAGNOSIS — H4011X0 Primary open-angle glaucoma, stage unspecified: Secondary | ICD-10-CM | POA: Insufficient documentation

## 2013-10-21 DIAGNOSIS — I10 Essential (primary) hypertension: Secondary | ICD-10-CM | POA: Insufficient documentation

## 2013-10-21 DIAGNOSIS — K219 Gastro-esophageal reflux disease without esophagitis: Secondary | ICD-10-CM | POA: Insufficient documentation

## 2013-10-21 DIAGNOSIS — E11339 Type 2 diabetes mellitus with moderate nonproliferative diabetic retinopathy without macular edema: Secondary | ICD-10-CM | POA: Insufficient documentation

## 2013-10-21 DIAGNOSIS — H409 Unspecified glaucoma: Secondary | ICD-10-CM | POA: Insufficient documentation

## 2013-10-21 DIAGNOSIS — H2510 Age-related nuclear cataract, unspecified eye: Secondary | ICD-10-CM | POA: Insufficient documentation

## 2013-10-21 DIAGNOSIS — I498 Other specified cardiac arrhythmias: Secondary | ICD-10-CM | POA: Insufficient documentation

## 2013-10-21 DIAGNOSIS — E1139 Type 2 diabetes mellitus with other diabetic ophthalmic complication: Secondary | ICD-10-CM | POA: Insufficient documentation

## 2013-10-21 HISTORY — PX: CATARACT EXTRACTION W/PHACO: SHX586

## 2013-10-21 LAB — GLUCOSE, CAPILLARY
Glucose-Capillary: 111 mg/dL — ABNORMAL HIGH (ref 70–99)
Glucose-Capillary: 67 mg/dL — ABNORMAL LOW (ref 70–99)
Glucose-Capillary: 81 mg/dL (ref 70–99)
Glucose-Capillary: 57 mg/dL — ABNORMAL LOW (ref 70–99)
Glucose-Capillary: 72 mg/dL (ref 70–99)
Glucose-Capillary: 100 mg/dL — ABNORMAL HIGH (ref 70–99)
Glucose-Capillary: 87 mg/dL (ref 70–99)

## 2013-10-21 SURGERY — PHACOEMULSIFICATION, CATARACT, WITH IOL INSERTION
Anesthesia: Monitor Anesthesia Care | Site: Eye | Laterality: Left | Wound class: Clean

## 2013-10-21 MED ORDER — DEXTROSE 50 % IV SOLN
INTRAVENOUS | Status: AC
  Administered 2013-10-21: 1 via INTRAVENOUS
  Filled 2013-10-21: qty 50

## 2013-10-21 MED ORDER — PHENYLEPHRINE HCL 2.5 % OP SOLN
1.0000 [drp] | Freq: Three times a day (TID) | OPHTHALMIC | Status: DC | PRN
  Administered 2013-10-21 (×3): 1 [drp] via OPHTHALMIC
  Filled 2013-10-21: qty 2

## 2013-10-21 MED ORDER — ACETYLCHOLINE CHLORIDE 1:100 IO SOLR
INTRAOCULAR | Status: DC | PRN
  Administered 2013-10-21: 20 mg via INTRAOCULAR

## 2013-10-21 MED ORDER — DEXAMETHASONE SODIUM PHOSPHATE 10 MG/ML IJ SOLN
INTRAMUSCULAR | Status: AC
  Filled 2013-10-21: qty 1

## 2013-10-21 MED ORDER — TETRACAINE HCL 0.5 % OP SOLN
OPHTHALMIC | Status: DC | PRN
  Administered 2013-10-21: 2 [drp] via OPHTHALMIC

## 2013-10-21 MED ORDER — TETRACAINE HCL 0.5 % OP SOLN
OPHTHALMIC | Status: AC
  Filled 2013-10-21: qty 2

## 2013-10-21 MED ORDER — DEXTROSE 50 % IV SOLN
25.0000 mL | Freq: Once | INTRAVENOUS | Status: AC | PRN
  Administered 2013-10-21: 09:00:00 via INTRAVENOUS
  Filled 2013-10-21: qty 50

## 2013-10-21 MED ORDER — DEXTROSE 50 % IV SOLN
INTRAVENOUS | Status: AC
  Filled 2013-10-21: qty 50

## 2013-10-21 MED ORDER — LIDOCAINE-EPINEPHRINE 2 %-1:100000 IJ SOLN
INTRAMUSCULAR | Status: AC
  Filled 2013-10-21: qty 1

## 2013-10-21 MED ORDER — ACETYLCHOLINE CHLORIDE 1:100 IO SOLR
INTRAOCULAR | Status: AC
  Filled 2013-10-21: qty 1

## 2013-10-21 MED ORDER — SODIUM CHLORIDE 0.9 % IV SOLN
INTRAVENOUS | Status: DC
  Administered 2013-10-21: 09:00:00 via INTRAVENOUS

## 2013-10-21 MED ORDER — TROPICAMIDE 1 % OP SOLN
1.0000 [drp] | Freq: Three times a day (TID) | OPHTHALMIC | Status: DC | PRN
  Administered 2013-10-21 (×3): 1 [drp] via OPHTHALMIC
  Filled 2013-10-21: qty 2

## 2013-10-21 MED ORDER — SODIUM HYALURONATE 10 MG/ML IO SOLN
INTRAOCULAR | Status: AC
  Filled 2013-10-21: qty 0.85

## 2013-10-21 MED ORDER — EPINEPHRINE HCL 1 MG/ML IJ SOLN
INTRAMUSCULAR | Status: AC
Start: 2013-10-21 — End: 2013-10-21
  Filled 2013-10-21: qty 1

## 2013-10-21 MED ORDER — BSS IO SOLN
INTRAOCULAR | Status: DC | PRN
  Administered 2013-10-21: 15 mL via INTRAOCULAR

## 2013-10-21 MED ORDER — SODIUM HYALURONATE 10 MG/ML IO SOLN
INTRAOCULAR | Status: DC | PRN
  Administered 2013-10-21: 0.85 mL via INTRAOCULAR

## 2013-10-21 MED ORDER — EPINEPHRINE HCL 1 MG/ML IJ SOLN
INTRAMUSCULAR | Status: AC
  Filled 2013-10-21: qty 1

## 2013-10-21 MED ORDER — GENTAMICIN SULFATE 40 MG/ML IJ SOLN
INTRAMUSCULAR | Status: AC
  Filled 2013-10-21: qty 2

## 2013-10-21 MED ORDER — PILOCARPINE HCL 4 % OP SOLN
OPHTHALMIC | Status: AC
  Filled 2013-10-21: qty 15

## 2013-10-21 MED ORDER — KETOROLAC TROMETHAMINE 0.5 % OP SOLN
1.0000 [drp] | OPHTHALMIC | Status: AC
  Administered 2013-10-21: 1 [drp] via OPHTHALMIC
  Filled 2013-10-21: qty 5

## 2013-10-21 MED ORDER — DEXTROSE 50 % IV SOLN
1.0000 | Freq: Once | INTRAVENOUS | Status: DC
  Filled 2013-10-21: qty 50

## 2013-10-21 MED ORDER — BSS IO SOLN
INTRAOCULAR | Status: AC
  Filled 2013-10-21: qty 500

## 2013-10-21 MED ORDER — TROPICAMIDE 1 % OP SOLN
OPHTHALMIC | Status: AC
  Filled 2013-10-21: qty 3

## 2013-10-21 MED ORDER — PHENYLEPHRINE HCL 2.5 % OP SOLN
OPHTHALMIC | Status: AC
  Filled 2013-10-21: qty 2

## 2013-10-21 MED ORDER — LIDOCAINE HCL (CARDIAC) 20 MG/ML IV SOLN
INTRAVENOUS | Status: DC | PRN
  Administered 2013-10-21: 50 mg via INTRAVENOUS

## 2013-10-21 MED ORDER — STERILE WATER FOR IRRIGATION IR SOLN
Status: DC | PRN
  Administered 2013-10-21: 100 mL

## 2013-10-21 MED ORDER — CYCLOPENTOLATE-PHENYLEPHRINE 0.2-1 % OP SOLN
1.0000 [drp] | OPHTHALMIC | Status: AC
  Administered 2013-10-21 (×4): 1 [drp] via OPHTHALMIC
  Filled 2013-10-21: qty 2

## 2013-10-21 MED ORDER — TOBRAMYCIN-DEXAMETHASONE 0.3-0.1 % OP OINT
TOPICAL_OINTMENT | OPHTHALMIC | Status: AC
  Filled 2013-10-21: qty 3.5

## 2013-10-21 MED ORDER — NA CHONDROIT SULF-NA HYALURON 40-30 MG/ML IO SOLN
INTRAOCULAR | Status: DC | PRN
  Administered 2013-10-21: 0.5 mL via INTRAOCULAR

## 2013-10-21 MED ORDER — 0.9 % SODIUM CHLORIDE (POUR BTL) OPTIME
TOPICAL | Status: DC | PRN
  Administered 2013-10-21: 200 mL

## 2013-10-21 MED ORDER — TOBRAMYCIN 0.3 % OP OINT
TOPICAL_OINTMENT | OPHTHALMIC | Status: DC | PRN
  Administered 2013-10-21: 1 via OPHTHALMIC

## 2013-10-21 MED ORDER — NA CHONDROIT SULF-NA HYALURON 40-30 MG/ML IO SOLN
INTRAOCULAR | Status: AC
  Filled 2013-10-21: qty 0.5

## 2013-10-21 MED ORDER — BUPIVACAINE HCL (PF) 0.75 % IJ SOLN
INTRAMUSCULAR | Status: AC
  Filled 2013-10-21: qty 10

## 2013-10-21 MED ORDER — LIDOCAINE-EPINEPHRINE 2 %-1:100000 IJ SOLN
INTRAMUSCULAR | Status: DC | PRN
  Administered 2013-10-21: 12:00:00 via RETROBULBAR

## 2013-10-21 MED ORDER — PROPOFOL 10 MG/ML IV BOLUS
INTRAVENOUS | Status: DC | PRN
  Administered 2013-10-21: 30 mg via INTRAVENOUS
  Administered 2013-10-21: 50 mg via INTRAVENOUS
  Administered 2013-10-21: 20 mg via INTRAVENOUS

## 2013-10-21 MED ORDER — BSS IO SOLN
INTRAOCULAR | Status: AC
  Filled 2013-10-21: qty 15

## 2013-10-21 MED ORDER — DEXTROSE 50 % IV SOLN
25.0000 mL | Freq: Once | INTRAVENOUS | Status: AC
  Administered 2013-10-21: 25 mL via INTRAVENOUS
  Filled 2013-10-21: qty 50

## 2013-10-21 MED ORDER — EPINEPHRINE HCL 1 MG/ML IJ SOLN
INTRAOCULAR | Status: DC | PRN
  Administered 2013-10-21: 11:00:00

## 2013-10-21 SURGICAL SUPPLY — 51 items
APL SRG 3 HI ABS STRL LF PLS (MISCELLANEOUS) ×1
APPLICATOR COTTON TIP 6IN STRL (MISCELLANEOUS) ×2 IMPLANT
APPLICATOR DR MATTHEWS STRL (MISCELLANEOUS) ×2 IMPLANT
BLADE KERATOME 2.75 (BLADE) ×2 IMPLANT
BLADE MINI RND TIP GREEN BEAV (BLADE) IMPLANT
BLADE STAB KNIFE 45DEG (BLADE) IMPLANT
CANNULA ANTERIOR CHAMBER 27GA (MISCELLANEOUS) ×2 IMPLANT
CLOTH BEACON ORANGE TIMEOUT ST (SAFETY) ×1 IMPLANT
CORDS BIPOLAR (ELECTRODE) IMPLANT
COVER MAYO STAND STRL (DRAPES) ×2 IMPLANT
DRAPE OPHTHALMIC 40X48 W POUCH (DRAPES) ×2 IMPLANT
DRAPE RETRACTOR (MISCELLANEOUS) ×2 IMPLANT
FILTER BLUE MILLIPORE (MISCELLANEOUS) IMPLANT
GLOVE BIO SURGEON STRL SZ8 (GLOVE) ×2 IMPLANT
GLOVE SURG ORTHO 8.0 STRL STRW (GLOVE) ×1 IMPLANT
GLOVE SURG SS PI 6.5 STRL IVOR (GLOVE) ×1 IMPLANT
GLOVE SURG SS PI 7.0 STRL IVOR (GLOVE) ×1 IMPLANT
GOWN STRL NON-REIN LRG LVL3 (GOWN DISPOSABLE) ×4 IMPLANT
GOWN STRL REIN XL XLG (GOWN DISPOSABLE) ×1 IMPLANT
KIT BASIN OR (CUSTOM PROCEDURE TRAY) ×2 IMPLANT
KIT ROOM TURNOVER OR (KITS) ×2 IMPLANT
KNIFE CRESCENT 2.5 55 ANG (BLADE) IMPLANT
LENS IOL ACRSF IQ PC 22.0 (Intraocular Lens) IMPLANT
LENS IOL ACRYSOF IQ POST 22.0 (Intraocular Lens) ×2 IMPLANT
MASK EYE SHIELD (GAUZE/BANDAGES/DRESSINGS) ×1 IMPLANT
NDL 18GX1X1/2 (RX/OR ONLY) (NEEDLE) ×1 IMPLANT
NDL 25GX 5/8IN NON SAFETY (NEEDLE) ×1 IMPLANT
NDL FILTER BLUNT 18X1 1/2 (NEEDLE) ×1 IMPLANT
NEEDLE 18GX1X1/2 (RX/OR ONLY) (NEEDLE) ×2 IMPLANT
NEEDLE 25GX 5/8IN NON SAFETY (NEEDLE) ×2 IMPLANT
NEEDLE FILTER BLUNT 18X 1/2SAF (NEEDLE) ×1
NEEDLE FILTER BLUNT 18X1 1/2 (NEEDLE) ×1 IMPLANT
NS IRRIG 1000ML POUR BTL (IV SOLUTION) ×2 IMPLANT
PACK CATARACT CUSTOM (CUSTOM PROCEDURE TRAY) ×2 IMPLANT
PAD ARMBOARD 7.5X6 YLW CONV (MISCELLANEOUS) ×2 IMPLANT
PAD EYE OVAL STERILE LF (GAUZE/BANDAGES/DRESSINGS) ×1 IMPLANT
PAK PIK CVS CATARACT (OPHTHALMIC) ×1 IMPLANT
PROBE ANTERIOR VITRECTOR (OPHTHALMIC) IMPLANT
SHUTTLE MONARCH TYPE A (NEEDLE) ×1 IMPLANT
SPEAR EYE SURG WECK-CEL (MISCELLANEOUS) IMPLANT
SUT ETHILON 10 0 CS140 6 (SUTURE) ×1 IMPLANT
SUT SILK 4 0 C 3 735G (SUTURE) IMPLANT
SUT SILK 6 0 G 6 (SUTURE) IMPLANT
SUT VICRYL 8 0 TG140 8 (SUTURE) IMPLANT
SYR 3ML LL SCALE MARK (SYRINGE) IMPLANT
SYR TB 1ML LUER SLIP (SYRINGE) ×2 IMPLANT
TAPE SURG TRANSPORE 1 IN (GAUZE/BANDAGES/DRESSINGS) IMPLANT
TAPE SURGICAL TRANSPORE 1 IN (GAUZE/BANDAGES/DRESSINGS) ×1
TIP PHACO STRAIGHT 30DEG (OPHTHALMIC) ×2 IMPLANT
TOWEL OR 17X24 6PK STRL BLUE (TOWEL DISPOSABLE) ×4 IMPLANT
WATER STERILE IRR 1000ML POUR (IV SOLUTION) ×2 IMPLANT

## 2013-10-21 NOTE — Anesthesia Postprocedure Evaluation (Signed)
  Anesthesia Post-op Note  Patient: [name]  Procedure(s) Performed: Procedure(s): LEFT CATARACT EXTRACTION PHACO AND INTRAOCULAR LENS PLACEMENT (IOC) (Left)  Patient Location: PACU  Anesthesia Type:MAC  Level of Consciousness: awake  Airway and Oxygen Therapy: Patient Spontanous Breathing  Post-op Pain: mild  Post-op Assessment: Post-op Vital signs reviewed  Post-op Vital Signs: Reviewed  Complications: No apparent anesthesia complications

## 2013-10-21 NOTE — Progress Notes (Addendum)
Hypoglycemic Event  CBG: 67  Treatment: D50 IV 25 mL  Symptoms: None  Follow-up CBG: Time:1000 CBG Result:111  Possible Reasons for Event: Inadequate meal intake  Comments/MD notified:Dr Shona Simpson, Clodagh Odenthal Ward  Remember to initiate Hypoglycemia Order Set & complete

## 2013-10-21 NOTE — H&P (View-Only) (Signed)
                  History & Physical:   DATE:   09-11-2013  NAME:  [name], [name]     [id number]       HISTORY OF PRESENT ILLNESS: Chief Eye Complaints blurry vision   Seem like a skim over left eye Problem 2 diabetes mellitus 3 primary open angle glaucoma.  The patient ran out of Combigan approximately 1 month ago      HPI: EYES: Reports symptoms of  patient cannot see well to drive do housework perform daily activities.  She states that vision, right eye.  Never improved following cataract surgery.    LOCATION:   BOTH EYES        QUALITY/COURSE:   Reports condition is worsening.        INTENSITY/SEVERITY:    Reports measurement ( or degree) as  severe   DURATION:   Reports the general length of symptoms to be months.      ONSET/TIMING:   Reports occurrence as   CONTEXT/WHEN:   Reports usually associated with   MODIFIERS/TREATMENTS:  Improved by              ROS:   GEN- Constitutional: HENT:  Dry eyes, cataract GEN - Endocrine: Reports symptoms of diabetes.    LUNGS/Respiratory:  HEART/Cardiovascular: Reports symptoms of hypertension.    ABD/Gastrointestinal:   Musculoskeletal (BJE): NEURO/Neurological: PSYCH/Psychiatric:    Is the pt oriented to time, place, person? 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  Epiretinal membrane, right eye Starter - Active Problems:   Blurring, visual   ICD#368.8  Onset: 09/11/2013 12:08  Initial Date:   Primary open angle glaucoma   ICD#365.11  Onset: 09/11/2013 12:08  Initial Date:    Benign hypertension   ICD#401.1  Onset: 09/11/2013 12:08  Initial Date:        Diabetes - Type 2 - unspecified   ICD#250.00  Onset: 09/11/2013 12:08  Initial  Date:     Diabetic retinopathy, nonproliferative, moderate   ICD#362.05 Onset: 09/11/2013 13:59 Initial Date:  SURGERIES: Pick List - Surgeries  MEDICATIONS: Aspirin:  81 mg tablet  SIG-  1 each   once a day   Metformin (Glucophage):   500 mg tablet  SIG-  1 each    2 times a day    Combigan: 0.2%-0.5% solution SIG-  1 gtt in each affected eye every 12 hours for 30 days   Restasis (Cyclosporine): 0.05% emulsion SIG-  1 gtt in each affected eye every 12 hours for 30 days  REVIEW OF SYSTEMS: not found  TOBACCO: Never smoker   ICD#V13.89 Onset: 09/11/2013 12:09 Initial Date:   Tobacco use:     Tobacco cessation:  SOCIAL HISTORY: Single.  FAMILY HISTORY: Positive family history for  -   Diabetes - Type 2:  ;   Glaucoma:;   Hypertension:   Negative family history for  -   PARENTS: CHILDREN: Glaucoma:;   Hypertension:   son GRANDPARENTS: SIBLINGS: UNCLES/AUNTS:       Family History - 1st Degree Relatives:  Mother dead.  ALLERGIES: Drug Allergies.  No Known.  PHYSICAL EXAMINATION:   VS: BMI: 41.7.  BP: 130/70.  H: 65.00 in.  RR: 20 /min.  W: 250lbs 0oz.    Va     OD [place] 20/CF   phni   OS [place] 20/200  phni         EYEGLASSES: LOST OD                                              OS ADD:  Auto Refraction OD:+0.25+0.50x163 OS:-2.25+1.50x006  K's:09/11/2013 12:24  constricted OD:40.75 41.25 41.00 OS:41.00,41.50,41.25  MR: OD: NI OS: NI ADD  VF:   OD: Constricted                                             OS: Constricted  Motility orthophoria and full  PUPILS: 3 mm round reactive negative Marcus Gunn  EYELIDS & OCULAR ADNEXA  infraorbital hyperpigmentation  SLE: Conjunctiva superior scar OD quiet each eye  Cornea arcus each eye with decrease tear film   anterior chamber  deep and quiet each eye  Iris brown no vessels either eye  Lens posterior chamber intraocular lens implant OD +3 nuclear sclerosis OS  Vitreous  CCT  Ta   in mmHg    OD  18           OS 18 Time 1:00 PM  Gonio   Dilation Tropicamide 1% phenylephrine 2.5%  Fundus:  optic nerve  OD  40% cup slight temporal discoloration SHUNT vessels noted                                                OS 40% cupping rim intact   Macula:    Epiretinal membrane   OD:    Epiretinal membrane                                                 OS: Irregular light reflex  Vessels: Microaneurysms and dot hemorrhages noted OD Narrow arterioles each eye  Periphery: Superior panretinal photocoagulation along the superior arcade OD     Exam: GENERAL: Appearance: General appearance can be described as well-nourished, well-developed, and in no acute distress.    LYMPHATIC: HEAD, EARS, NOSE AND THROAT: Ears-Nose (external) Inspection: Externally, nose and ears are normal in appearance and without scars, lesions, or nodules.      Otoscopic Exam: External auditory canals and tympanic membranes are normal.      Hearing assessment shows no problems with normal conversation.    Nose exam, internally, reveals nasal mucosa, septum and turbinates are unremarkable.    Teeth, Gingiva, and Lip Exams: No lesions or evidence of infection.      Oropharynx demonstrates oral mucosa, salivary glands, tongue, tonsils, posterior pharynx, hard-soft palates are normal.  EYES: see above  NECK: Neck tissue exam demonstrates no masses, symmetrical, and trachea is midline.      LUNGS and RESPIRATORY: Lung auscultation elicits no wheezing, rhonci, rales or rubs and with equal breath sounds.      Respiratory effort described as breathing is unlabored and chest movement is symmetrical.    HEART (Cardiovascular): Heart auscultation discovers  grade 2/6 murmur left upper sternal border ABDOMEN (Gastrointestinal): Mass/Tenderness Exam: Neither are present.     Liver/Spleen: No hepatomegaly or splenomegaly.   MUSCULOSKELETAL (BJE): Inspection-Palpation: No major bone, joint, tendon, or muscle changes.       NEUROLOGICAL: Alert and oriented. No major deficits of coordination or sensation.      PSYCHIATRIC: Insight and judgment appear  both to be intact and appropriate.    Mood and affect are described as normal mood and full affect.    SKIN: Skin Inspection: No rashes or lesions.  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  Epiretinal membrane, right eye Starter - Active Problems:   Blurring, visual   ICD#368.8  Onset: 09/11/2013 12:08  Initial Date:   Primary open angle glaucoma   ICD#365.11  Onset: 09/11/2013 12:08  Initial Date:    Benign hypertension   ICD#401.1  Onset: 09/11/2013 12:08  Initial Date:        Diabetes - Type 2 - unspecified   ICD#250.00  Onset: 09/11/2013 12:08  Initial Date:     Diabetic retinopathy, nonproliferative, moderate   ICD#362.05 Onset: 09/11/2013 13:59 Initial Date:  SURGICAL TREATMENT PLAN: phacoemulsification with intraocular lens implant, left eye.  Risk and benefits of surgery have been reviewed with the patient and the patient agrees to proceed with the surgical procedure.   Restart Combigan each eye twice a day DILATED MACULAR/FUNDUS XM COMMUNJ TX PHYS/QHP CPT#5010F Related Dxs-  Modifiers-     EYE EXAM WITH PHOTOS.   CPT#92250 Related Dxs-  Modifiers-   Dilated fundus eval done CPT#2020F Related Dxs-  Modifiers-      Actions:     Handouts: Hypertension, glaucoma , what is glaucoma?, glaucoma treatment.   ECHO EXAM OF EYE.      CPT#76519  Related Dxs-  Modifiers-    :  Old Records Requested:  Discuss with physician:  Summary updated:   Second Interpretation:    ___________________________ Mar Zettler, Jr. Starter - Inactive Problems:  

## 2013-10-21 NOTE — Anesthesia Preprocedure Evaluation (Addendum)
Anesthesia Evaluation  Patient identified by MRN, date of birth, ID band Patient awake    Reviewed: Allergy & Precautions, H&P , NPO status , Patient's Chart, lab work & pertinent test results  Airway Mallampati: I TM Distance: >3 FB Neck ROM: Full    Dental  (+) Edentulous Upper, Partial Lower, Poor Dentition and Dental Advisory Given   Pulmonary sleep apnea ,          Cardiovascular hypertension, Pt. on medications Rhythm:Regular Rate:Bradycardia     Neuro/Psych H/O Myasthenia gravis on Mestinon and Prednisone  Neuromuscular disease    GI/Hepatic GERD-  Medicated and Controlled,  Endo/Other  diabetes, Type 2, Insulin DependentHypothyroidism   Renal/GU      Musculoskeletal   Abdominal   Peds  Hematology   Anesthesia Other Findings   Reproductive/Obstetrics                         Anesthesia Physical Anesthesia Plan  ASA: III  Anesthesia Plan: MAC   Post-op Pain Management:    Induction: Intravenous  Airway Management Planned: Natural Airway  Additional Equipment:   Intra-op Plan:   Post-operative Plan:   Informed Consent: I have reviewed the patients History and Physical, chart, labs and discussed the procedure including the risks, benefits and alternatives for the proposed anesthesia with the patient or authorized representative who has indicated his/her understanding and acceptance.     Plan Discussed with: CRNA and Surgeon  Anesthesia Plan Comments:         Anesthesia Quick Evaluation

## 2013-10-21 NOTE — Progress Notes (Signed)
Dr Michelle Piper notified of CBG of 57 and order received and given

## 2013-10-21 NOTE — Op Note (Signed)
Preoperative diagnosis: Visually significant cataract left eye and glaucoma and diabetes Postoperative diagnosis: Same Procedure phacoemulsification with intraocular lens implant Complications: None Anesthesia 2% Xylocaine with epinephrine in a 50-50 mixture 0.75% Marcaine with ample Wydase Assistant: Mindy Procedure: The patient was taken to the operating room where she was given a peribulbar block with the aforementioned local anesthetic agent pressure was applied to the globe. Following this the patient's face was prepped and draped in the usual sterile fashion with a lid speculum inserted and the surgeon sitting temporally the operating microscope was in position. A Weck-Cel sponges used to fixate the globe and a 15 blade was used to enter through inferior clear cornea Viscoat was injected into the eye. At this point it was noted that the patient tended to move her head excessively we asked the patient to hold still it was necessary during the case to have the assistant holding patient's head. A Weck-Cel sponges used to fixate the globe and a 2.75 mm keratome blade was used to into the anterior chamber additional Viscoat was injected in the eye and a bent 25-gauge needle was used to incise anterior capsule and a continuous tear curvilinear capsulorrhexis was formed. The assess was then used to hydrodissect and hydrodelineate the nucleus the nucleus was noted to rotate in the capsular bag. Following this the phacoemulsification unit was used to sculpt for troughs in a moderately dense nucleus the Kuglen hook and phacotip were then used to separate the nucleus into 4 quadrants and all 4 quadrants were aspirated from the eye using viscoelastic to elevate the final quadrant. Following this the epinucleus was then removed using the irrigation aspiration hand tip and all cortical fibers were stripped the posterior capsule remaining intact. Following this Provisc was injected into the anterior chamber the  intraocular lens implant was examined and noted to have no defects the lens was an Alcon AcrySof SN 60 WF 22.0 diopter lens SN #[id number] the lens was injected and a Kuglen hook was used to position the lens following this Miochol was injected in the eye the eye was pressurized and a single  10-0 nylon suture was placed. The eye was again pressurized and there being no leakage all instruments were removed from the eye topical TobraDex ointment was applied to the eye a patch and Fox U. were placed and the patient returned to recovery area in stable condition. [name] M.D.

## 2013-10-21 NOTE — Transfer of Care (Signed)
Immediate Anesthesia Transfer of Care Note  Patient: [name]  Procedure(s) Performed: Procedure(s): LEFT CATARACT EXTRACTION PHACO AND INTRAOCULAR LENS PLACEMENT (IOC) (Left)  Patient Location: Short Stay  Anesthesia Type:MAC  Level of Consciousness: awake, alert  and oriented  Airway & Oxygen Therapy: Patient Spontanous Breathing  Post-op Assessment: Report given to PACU RN  Post vital signs: Reviewed and stable  Complications: No apparent anesthesia complications

## 2013-10-21 NOTE — Anesthesia Postprocedure Evaluation (Signed)
  Anesthesia Post-op Note  Patient: [name]  Procedure(s) Performed: Procedure(s): LEFT CATARACT EXTRACTION PHACO AND INTRAOCULAR LENS PLACEMENT (IOC) (Left)  Patient Location: Short Stay  Anesthesia Type:MAC  Level of Consciousness: awake, alert  and oriented  Airway and Oxygen Therapy: Patient Spontanous Breathing  Post-op Pain: none  Post-op Assessment: Post-op Vital signs reviewed  Post-op Vital Signs: Reviewed and stable  Complications: No apparent anesthesia complications

## 2013-10-21 NOTE — Interval H&P Note (Signed)
History and Physical Interval Note:  10/21/2013 10:43 AM  [name]  has presented today for surgery, with the diagnosis of LEFT EYE CATARACT   The various methods of treatment have been discussed with the patient and family. After consideration of risks, benefits and other options for treatment, the patient has consented to  Procedure(s): LEFT CATARACT EXTRACTION PHACO AND INTRAOCULAR LENS PLACEMENT (IOC) (Left) as a surgical intervention .  The patient's history has been reviewed, patient examined, no change in status, stable for surgery.  I have reviewed the patient's chart and labs.  Questions were answered to the patient's satisfaction.     Michaell Grider

## 2013-10-21 NOTE — Preoperative (Signed)
Beta Blockers   Reason not to administer Beta Blockers:Not Applicable 

## 2013-10-21 NOTE — Progress Notes (Signed)
Hypoglycemic Event  CBG: 63  Treatment: D50 IV 25 mL  Symptoms: None  Follow-up CBG: [id number] CBG Result:83  Possible Reasons for Event: Inadequate meal intake  Comments/MD notified:Dr Ossey at 0815    Gillian Shields Ward  Remember to initiate Hypoglycemia Order Set & complete

## 2013-10-22 ENCOUNTER — Encounter (HOSPITAL_COMMUNITY): Payer: Self-pay | Admitting: Ophthalmology

## 2013-10-27 ENCOUNTER — Other Ambulatory Visit: Payer: Self-pay | Admitting: Family Medicine

## 2013-10-29 ENCOUNTER — Other Ambulatory Visit: Payer: Self-pay | Admitting: Family Medicine

## 2013-11-25 ENCOUNTER — Other Ambulatory Visit: Payer: Self-pay | Admitting: Family Medicine

## 2013-12-02 LAB — HM DIABETES EYE EXAM

## 2013-12-15 ENCOUNTER — Other Ambulatory Visit: Payer: Self-pay | Admitting: Family Medicine

## 2013-12-15 ENCOUNTER — Telehealth: Payer: Self-pay | Admitting: Family Medicine

## 2013-12-15 MED ORDER — LISINOPRIL 40 MG PO TABS: 40.0000 mg | ORAL_TABLET | Freq: Every day | ORAL | Status: DC

## 2013-12-15 MED ORDER — HYDROCHLOROTHIAZIDE 25 MG PO TABS: 25.0000 mg | ORAL_TABLET | Freq: Every day | ORAL | Status: DC

## 2013-12-15 NOTE — Telephone Encounter (Signed)
pls contact the pt as well as rx care re change effective jan1, 2015, from lisinopril/HCTZ to the 2 drugs prescribed separately due to formulary change, scripts are printed, pls fax in

## 2013-12-15 NOTE — Telephone Encounter (Signed)
Patient aware.

## 2013-12-16 ENCOUNTER — Ambulatory Visit: Payer: Medicare Other | Admitting: Family Medicine

## 2013-12-16 ENCOUNTER — Ambulatory Visit (INDEPENDENT_AMBULATORY_CARE_PROVIDER_SITE_OTHER): Payer: Medicare Other | Admitting: Family Medicine

## 2013-12-16 ENCOUNTER — Encounter (INDEPENDENT_AMBULATORY_CARE_PROVIDER_SITE_OTHER): Payer: Self-pay

## 2013-12-16 ENCOUNTER — Encounter: Payer: Self-pay | Admitting: Family Medicine

## 2013-12-16 ENCOUNTER — Encounter (INDEPENDENT_AMBULATORY_CARE_PROVIDER_SITE_OTHER): Payer: Self-pay | Admitting: Ophthalmology

## 2013-12-16 VITALS — BP 144/80 | HR 88 | Resp 18 | Ht 66.0 in | Wt 245.0 lb

## 2013-12-16 DIAGNOSIS — Z23 Encounter for immunization: Secondary | ICD-10-CM

## 2013-12-16 DIAGNOSIS — F329 Major depressive disorder, single episode, unspecified: Secondary | ICD-10-CM

## 2013-12-16 DIAGNOSIS — E1121 Type 2 diabetes mellitus with diabetic nephropathy: Secondary | ICD-10-CM

## 2013-12-16 DIAGNOSIS — E785 Hyperlipidemia, unspecified: Secondary | ICD-10-CM

## 2013-12-16 DIAGNOSIS — D638 Anemia in other chronic diseases classified elsewhere: Secondary | ICD-10-CM

## 2013-12-16 DIAGNOSIS — E669 Obesity, unspecified: Secondary | ICD-10-CM

## 2013-12-16 DIAGNOSIS — I10 Essential (primary) hypertension: Secondary | ICD-10-CM

## 2013-12-16 DIAGNOSIS — N058 Unspecified nephritic syndrome with other morphologic changes: Secondary | ICD-10-CM

## 2013-12-16 DIAGNOSIS — E1129 Type 2 diabetes mellitus with other diabetic kidney complication: Secondary | ICD-10-CM

## 2013-12-16 LAB — CBC
HCT: 29.7 % — ABNORMAL LOW (ref 36.0–46.0)
Hemoglobin: 9.4 g/dL — ABNORMAL LOW (ref 12.0–15.0)
MCH: 26.9 pg (ref 26.0–34.0)
MCHC: 31.6 g/dL (ref 30.0–36.0)
MCV: 84.9 fL (ref 78.0–100.0)
RBC: 3.5 MIL/uL — ABNORMAL LOW (ref 3.87–5.11)

## 2013-12-16 NOTE — Patient Instructions (Addendum)
F/u in 4 month, call if you need me before please.  I am happy that you are getting your vision taken care of, I hope that things continue to improve  Flu vaccine today  HBa1C, cmp and EGFr today also CBc and iron  Fasting lipid, cmp and EGFR , hBa1C in April 2015 and vit D

## 2013-12-16 NOTE — Assessment & Plan Note (Signed)
Improved and controlled, no change in medication

## 2013-12-16 NOTE — Assessment & Plan Note (Signed)
adeqaute control, though sub optimal, no change in management  DASH diet and commitment to daily physical activity for a minimum of 30 minutes discussed and encouraged, as a part of hypertension management. The importance of attaining a healthy weight is also discussed.

## 2013-12-16 NOTE — Assessment & Plan Note (Signed)
Patient advised to reduce carb and sweets, commit to regular physical activity, take meds as prescribed, test blood as directed, and attempt to lose weight, to improve blood sugar control. Updated lab needed   

## 2013-12-16 NOTE — Progress Notes (Signed)
   Subjective:    Patient ID: [name], female    DOB: [birth year], 72 y.o.   MRN: [id number]  HPI The PT is here for follow up and re-evaluation of chronic medical conditions, medication management and review of any available recent lab and radiology data.  Preventive health is updated, specifically  Cancer screening and Immunization.   Questions or concerns regarding consultations or procedures which the PT has had in the interim are  Addressed.Has had cataract surgery on left eye, is having complications and will be seeing a retina specialist also The PT denies any adverse reactions to current medications since the last visit.  There are no new concerns.  There are no specific complaints . Still has mild to moderate depression, depending on the day, main issue is finanacial, and lack of any employment income States her appetiti is not that good, denies change in Bm, generally fasting sugar is under 130, does have "lows" at times in the day, when she does not eat      Review of Systems See HPI Denies recent fever or chills. Denies sinus pressure, nasal congestion, ear pain or sore throat. Denies chest congestion, productive cough or wheezing. Denies chest pains, palpitations and leg swelling Denies abdominal pain, nausea, vomiting,diarrhea or constipation.   Denies dysuria, frequency, hesitancy or incontinence. Denies joint pain, swelling and limitation in mobility. Denies headaches, seizures, numbness, or tingling. Denies depression, anxiety or insomnia. Denies skin break down or rash.        Objective:   Physical Exam  Patient alert and oriented and in no cardiopulmonary distress.  HEENT: No facial asymmetry, EOMI, no sinus tenderness,  oropharynx pink and moist.  Neck supple no adenopathy.  Chest: Clear to auscultation bilaterally.  CVS: S1, S2 no murmurs, no S3.  ABD: Soft non tender. Bowel sounds normal.  Ext: No edema  MS: Adequate ROM spine, shoulders,  hips and knees.  Skin: Intact, no ulcerations or rash noted.  Psych: Good eye contact, normal affect. Memory intact not anxious or depressed appearing.  CNS: CN 2-12 intact, power, tone and sensation normal throughout.       Assessment & Plan:

## 2013-12-16 NOTE — Assessment & Plan Note (Signed)
Improved. Pt applauded on succesful weight loss through lifestyle change, and encouraged to continue same. Weight loss goal set for the next several months.  

## 2013-12-16 NOTE — Assessment & Plan Note (Signed)
Hyperlipidemia:Low fat diet discussed and encouraged.  Updated lab needed 

## 2013-12-17 LAB — COMPLETE METABOLIC PANEL WITH GFR
ALT: 10 U/L (ref 0–35)
Albumin: 4 g/dL (ref 3.5–5.2)
BUN: 18 mg/dL (ref 6–23)
Chloride: 105 mEq/L (ref 96–112)
Creat: 1.11 mg/dL — ABNORMAL HIGH (ref 0.50–1.10)
Glucose, Bld: 64 mg/dL — ABNORMAL LOW (ref 70–99)
Potassium: 4.1 mEq/L (ref 3.5–5.3)
Total Protein: 6.8 g/dL (ref 6.0–8.3)

## 2013-12-21 ENCOUNTER — Other Ambulatory Visit: Payer: Self-pay | Admitting: Family Medicine

## 2013-12-29 ENCOUNTER — Other Ambulatory Visit: Payer: Self-pay

## 2013-12-29 MED ORDER — HYDROCODONE-ACETAMINOPHEN 10-325 MG PO TABS: ORAL_TABLET | ORAL | Status: DC

## 2014-01-07 ENCOUNTER — Encounter: Payer: Self-pay | Admitting: Family Medicine

## 2014-01-08 ENCOUNTER — Other Ambulatory Visit: Payer: Self-pay

## 2014-01-08 ENCOUNTER — Telehealth: Payer: Self-pay | Admitting: Family Medicine

## 2014-01-08 ENCOUNTER — Encounter (INDEPENDENT_AMBULATORY_CARE_PROVIDER_SITE_OTHER): Payer: Medicare Other | Admitting: Ophthalmology

## 2014-01-08 DIAGNOSIS — E1139 Type 2 diabetes mellitus with other diabetic ophthalmic complication: Secondary | ICD-10-CM

## 2014-01-08 DIAGNOSIS — E1165 Type 2 diabetes mellitus with hyperglycemia: Secondary | ICD-10-CM

## 2014-01-08 DIAGNOSIS — H43819 Vitreous degeneration, unspecified eye: Secondary | ICD-10-CM

## 2014-01-08 DIAGNOSIS — E11319 Type 2 diabetes mellitus with unspecified diabetic retinopathy without macular edema: Secondary | ICD-10-CM

## 2014-01-08 DIAGNOSIS — H35349 Macular cyst, hole, or pseudohole, unspecified eye: Secondary | ICD-10-CM

## 2014-01-08 DIAGNOSIS — H348392 Tributary (branch) retinal vein occlusion, unspecified eye, stable: Secondary | ICD-10-CM

## 2014-01-08 DIAGNOSIS — I10 Essential (primary) hypertension: Secondary | ICD-10-CM

## 2014-01-08 DIAGNOSIS — H35039 Hypertensive retinopathy, unspecified eye: Secondary | ICD-10-CM

## 2014-01-08 MED ORDER — INSULIN GLARGINE 100 UNIT/ML SOLOSTAR PEN: 30.0000 [IU] | PEN_INJECTOR | Freq: Every day | SUBCUTANEOUS | Status: DC

## 2014-01-08 NOTE — Telephone Encounter (Signed)
Returned patients call.

## 2014-01-09 NOTE — H&P (Signed)
[name] is an 73 y.o. female.   Chief Complaint:loss of vision in left eye.   HPI: Loss of vision left eye over past month. Macular hole  Past Medical History  Diagnosis Date  . Diabetes mellitus   . Hypertension   . Tobacco abuse   . Myasthenia gravis   . Arthritis   . Gastroesophageal reflux   . Thyroid disease   . Sleep apnea   . Iron deficiency anemia   . Depression   . Hyperlipidemia   . Obesity   . Carpal tunnel syndrome   . Shoulder pain   . Impingement syndrome, shoulder   . Pulmonary HTN   . Mitral regurgitation   . Schatzki's ring     Last EGD w/ dilation 02/08/11, 2009 & 2007  . Esophagitis, erosive 2009  . Diverticulosis 07/2004    Colonscopy Dr Gala Romney  . Heart murmur   . Headache(784.0)   . Pneumonia     Past Surgical History  Procedure Laterality Date  . Rt tka  05/13/07    Dr. Aline Brochure  . Right carpal tunnel release  02/01/2009  . Incontinence surgery  08/26/09    Tananbaum  . Cholecystectomy    . Abdominal hysterectomy    . Eye surgery    . Esophagogastroduodenoscopy  02/08/11    Rourk-Distal esophageal erosion consistent with mild erosive reflux   esophagitis/ Noncritical Schatzki ring, small hiatal hernia otherwise upper/ gastrointestinal tract appeared unremarkable, status post passage  of a Maloney dilation to biopsy disruption of the ring described  . Colonoscopy  05/08/2012    Procedure: COLONOSCOPY;  Surgeon: Daneil Dolin, MD;  Location: [facility];  Service: Endoscopy;  Laterality: N/A;  8:30  . Joint replacement      right total knee  . Appendectomy    . Tonsillectomy    . Cataract extraction w/phaco Left 10/21/2013    Procedure: LEFT CATARACT EXTRACTION PHACO AND INTRAOCULAR LENS PLACEMENT (IOC);  Surgeon: Marylynn Pearson, MD;  Location: [facility];  Service: Ophthalmology;  Laterality: Left;    Family History  Problem Relation Age of Onset  . Diabetes Son   . Hypertension Son   . Hypertension Daughter   . Cancer Mother   . Heart  disease Mother   . Cancer Father   . Heart disease Father    Social History:  reports that she quit smoking about 10 years ago. She has never used smokeless tobacco. She reports that she does not drink alcohol or use illicit drugs.  Allergies: No Known Allergies  No prescriptions prior to admission    Review of systems otherwise negative  There were no vitals taken for this visit.  Physical exam: Mental status: oriented x3. Eyes: See eye exam associated with this date of surgery in media tab.  Scanned in by scanning center Ears, Nose, Throat: within normal limits Neck: Within Normal limits General: within normal limits Chest: Within normal limits Breast: deferred Heart: Within normal limits Abdomen: Within normal limits GU: deferred Extremities: within normal limits Skin: within normal limits  Assessment/Plan Macular hole stage 4. Plan: To [facility] for Pars plana vitrectomy, laser, serum patch, gas injection left eye  [name], [name] 01/09/2014, 4:40 PM

## 2014-01-12 ENCOUNTER — Encounter (HOSPITAL_COMMUNITY): Payer: Self-pay | Admitting: Pharmacy Technician

## 2014-01-15 MED ORDER — HYDROMORPHONE HCL PF 1 MG/ML IJ SOLN: 0.2500 mg | INTRAMUSCULAR | Status: DC | PRN

## 2014-01-15 MED ORDER — ONDANSETRON HCL 4 MG/2ML IJ SOLN
4.0000 mg | Freq: Once | INTRAMUSCULAR | Status: DC | PRN
Start: 2014-01-15 — End: 2014-01-15

## 2014-01-15 MED ORDER — OXYCODONE HCL 5 MG/5ML PO SOLN: 5.0000 mg | Freq: Once | ORAL | Status: DC | PRN

## 2014-01-15 MED ORDER — OXYCODONE HCL 5 MG PO TABS: 5.0000 mg | ORAL_TABLET | Freq: Once | ORAL | Status: DC | PRN

## 2014-01-15 MED ORDER — MEPERIDINE HCL 25 MG/ML IJ SOLN: 6.2500 mg | INTRAMUSCULAR | Status: DC | PRN

## 2014-01-18 ENCOUNTER — Encounter (HOSPITAL_COMMUNITY): Payer: Self-pay | Admitting: *Deleted

## 2014-01-18 MED ORDER — GATIFLOXACIN 0.5 % OP SOLN: 1.0000 [drp] | OPHTHALMIC | Status: DC | PRN

## 2014-01-18 MED ORDER — PHENYLEPHRINE HCL 2.5 % OP SOLN: 1.0000 [drp] | OPHTHALMIC | Status: DC | PRN

## 2014-01-18 MED ORDER — CYCLOPENTOLATE HCL 1 % OP SOLN: 1.0000 [drp] | OPHTHALMIC | Status: DC | PRN

## 2014-01-18 MED ORDER — CEFAZOLIN SODIUM-DEXTROSE 2-3 GM-% IV SOLR
2.0000 g | INTRAVENOUS | Status: AC
  Administered 2014-01-19: 2 g via INTRAVENOUS
  Filled 2014-01-18: qty 50

## 2014-01-18 MED ORDER — TROPICAMIDE 1 % OP SOLN: 1.0000 [drp] | OPHTHALMIC | Status: DC | PRN

## 2014-01-19 ENCOUNTER — Ambulatory Visit (HOSPITAL_COMMUNITY): Payer: Medicare Other | Admitting: Anesthesiology

## 2014-01-19 ENCOUNTER — Encounter (HOSPITAL_COMMUNITY): Payer: Self-pay | Admitting: Anesthesiology

## 2014-01-19 ENCOUNTER — Encounter (HOSPITAL_COMMUNITY)
Admission: RE | Disposition: A | Discharge status: Home / Self Care | Payer: Self-pay | Source: Ambulatory Visit | Attending: Ophthalmology

## 2014-01-19 ENCOUNTER — Encounter (HOSPITAL_COMMUNITY): Payer: Medicare Other | Admitting: Anesthesiology

## 2014-01-19 ENCOUNTER — Ambulatory Visit (HOSPITAL_COMMUNITY): Payer: Medicare Other

## 2014-01-19 ENCOUNTER — Ambulatory Visit (HOSPITAL_COMMUNITY)
Admission: RE | Admit: 2014-01-19 | Discharge: 2014-01-20 | Disposition: A | Discharge status: Home / Self Care | Payer: Medicare Other | Source: Ambulatory Visit | Attending: Ophthalmology | Admitting: Ophthalmology

## 2014-01-19 DIAGNOSIS — H35349 Macular cyst, hole, or pseudohole, unspecified eye: Secondary | ICD-10-CM | POA: Insufficient documentation

## 2014-01-19 DIAGNOSIS — K219 Gastro-esophageal reflux disease without esophagitis: Secondary | ICD-10-CM | POA: Insufficient documentation

## 2014-01-19 DIAGNOSIS — M129 Arthropathy, unspecified: Secondary | ICD-10-CM | POA: Insufficient documentation

## 2014-01-19 DIAGNOSIS — D509 Iron deficiency anemia, unspecified: Secondary | ICD-10-CM | POA: Insufficient documentation

## 2014-01-19 DIAGNOSIS — H35342 Macular cyst, hole, or pseudohole, left eye: Secondary | ICD-10-CM | POA: Diagnosis present

## 2014-01-19 DIAGNOSIS — E119 Type 2 diabetes mellitus without complications: Secondary | ICD-10-CM | POA: Insufficient documentation

## 2014-01-19 DIAGNOSIS — F3289 Other specified depressive episodes: Secondary | ICD-10-CM | POA: Insufficient documentation

## 2014-01-19 DIAGNOSIS — Z961 Presence of intraocular lens: Secondary | ICD-10-CM | POA: Insufficient documentation

## 2014-01-19 DIAGNOSIS — E669 Obesity, unspecified: Secondary | ICD-10-CM | POA: Insufficient documentation

## 2014-01-19 DIAGNOSIS — F329 Major depressive disorder, single episode, unspecified: Secondary | ICD-10-CM | POA: Insufficient documentation

## 2014-01-19 DIAGNOSIS — Z90710 Acquired absence of both cervix and uterus: Secondary | ICD-10-CM | POA: Insufficient documentation

## 2014-01-19 DIAGNOSIS — Z9849 Cataract extraction status, unspecified eye: Secondary | ICD-10-CM | POA: Insufficient documentation

## 2014-01-19 DIAGNOSIS — Z87891 Personal history of nicotine dependence: Secondary | ICD-10-CM | POA: Insufficient documentation

## 2014-01-19 DIAGNOSIS — H33329 Round hole, unspecified eye: Secondary | ICD-10-CM

## 2014-01-19 DIAGNOSIS — G4730 Sleep apnea, unspecified: Secondary | ICD-10-CM | POA: Insufficient documentation

## 2014-01-19 DIAGNOSIS — R011 Cardiac murmur, unspecified: Secondary | ICD-10-CM | POA: Insufficient documentation

## 2014-01-19 DIAGNOSIS — E785 Hyperlipidemia, unspecified: Secondary | ICD-10-CM | POA: Insufficient documentation

## 2014-01-19 DIAGNOSIS — Z794 Long term (current) use of insulin: Secondary | ICD-10-CM | POA: Insufficient documentation

## 2014-01-19 DIAGNOSIS — F411 Generalized anxiety disorder: Secondary | ICD-10-CM | POA: Insufficient documentation

## 2014-01-19 DIAGNOSIS — K279 Peptic ulcer, site unspecified, unspecified as acute or chronic, without hemorrhage or perforation: Secondary | ICD-10-CM | POA: Insufficient documentation

## 2014-01-19 DIAGNOSIS — N189 Chronic kidney disease, unspecified: Secondary | ICD-10-CM | POA: Insufficient documentation

## 2014-01-19 DIAGNOSIS — E079 Disorder of thyroid, unspecified: Secondary | ICD-10-CM | POA: Insufficient documentation

## 2014-01-19 DIAGNOSIS — Z9889 Other specified postprocedural states: Secondary | ICD-10-CM | POA: Insufficient documentation

## 2014-01-19 DIAGNOSIS — I129 Hypertensive chronic kidney disease with stage 1 through stage 4 chronic kidney disease, or unspecified chronic kidney disease: Secondary | ICD-10-CM | POA: Insufficient documentation

## 2014-01-19 DIAGNOSIS — Z9089 Acquired absence of other organs: Secondary | ICD-10-CM | POA: Insufficient documentation

## 2014-01-19 DIAGNOSIS — I2789 Other specified pulmonary heart diseases: Secondary | ICD-10-CM | POA: Insufficient documentation

## 2014-01-19 DIAGNOSIS — I059 Rheumatic mitral valve disease, unspecified: Secondary | ICD-10-CM | POA: Insufficient documentation

## 2014-01-19 DIAGNOSIS — Z96659 Presence of unspecified artificial knee joint: Secondary | ICD-10-CM | POA: Insufficient documentation

## 2014-01-19 HISTORY — PX: 25 GAUGE PARS PLANA VITRECTOMY WITH 20 GAUGE MVR PORT: SHX6041

## 2014-01-19 HISTORY — DX: Dependence on other enabling machines and devices: Z99.89

## 2014-01-19 HISTORY — PX: PARS PLANA VITRECTOMY W/ REPAIR OF MACULAR HOLE: SHX2170

## 2014-01-19 HISTORY — DX: Shortness of breath: R06.02

## 2014-01-19 HISTORY — DX: Obstructive sleep apnea (adult) (pediatric): G47.33

## 2014-01-19 HISTORY — DX: Type 2 diabetes mellitus without complications: E11.9

## 2014-01-19 HISTORY — DX: Other seasonal allergic rhinitis: J30.2

## 2014-01-19 HISTORY — DX: Anxiety disorder, unspecified: F41.9

## 2014-01-19 HISTORY — DX: Unspecified chronic bronchitis: J42

## 2014-01-19 LAB — GLUCOSE, CAPILLARY
GLUCOSE-CAPILLARY: 129 mg/dL — AB (ref 70–99)
GLUCOSE-CAPILLARY: 270 mg/dL — AB (ref 70–99)
GLUCOSE-CAPILLARY: 65 mg/dL — AB (ref 70–99)
Glucose-Capillary: 73 mg/dL (ref 70–99)
Glucose-Capillary: 217 mg/dL — ABNORMAL HIGH (ref 70–99)
Glucose-Capillary: 221 mg/dL — ABNORMAL HIGH (ref 70–99)

## 2014-01-19 LAB — AUTOLOGOUS SERUM PATCH PREP

## 2014-01-19 LAB — BASIC METABOLIC PANEL
BUN: 15 mg/dL (ref 6–23)
CALCIUM: 9.4 mg/dL (ref 8.4–10.5)
CO2: 25 mEq/L (ref 19–32)
CREATININE: 0.92 mg/dL (ref 0.50–1.10)
Chloride: 104 mEq/L (ref 96–112)
GFR, EST AFRICAN AMERICAN: 71 mL/min — AB (ref >90)
GFR, EST NON AFRICAN AMERICAN: 62 mL/min — AB (ref >90)
Glucose, Bld: 68 mg/dL — ABNORMAL LOW (ref 70–99)
Potassium: 4.5 mEq/L (ref 3.7–5.3)
Sodium: 142 mEq/L (ref 137–147)

## 2014-01-19 LAB — CBC
HCT: 29.6 % — ABNORMAL LOW (ref 36.0–46.0)
Hemoglobin: 9.2 g/dL — ABNORMAL LOW (ref 12.0–15.0)
MCH: 26.6 pg (ref 26.0–34.0)
MCHC: 31.1 g/dL (ref 30.0–36.0)
MCV: 85.5 fL (ref 78.0–100.0)
PLATELETS: 325 10*3/uL (ref 150–400)
RBC: 3.46 MIL/uL — ABNORMAL LOW (ref 3.87–5.11)
RDW: 17.7 % — AB (ref 11.5–15.5)
WBC: 7.7 10*3/uL (ref 4.0–10.5)

## 2014-01-19 SURGERY — 25 GAUGE PARS PLANA VITRECTOMY WITH 20 GAUGE MVR PORT
Anesthesia: Monitor Anesthesia Care | Site: Eye | Laterality: Left

## 2014-01-19 MED ORDER — CITALOPRAM HYDROBROMIDE 40 MG PO TABS
40.0000 mg | ORAL_TABLET | Freq: Every day | ORAL | Status: DC
  Administered 2014-01-19 – 2014-01-20 (×2): 40 mg via ORAL
  Filled 2014-01-19 (×2): qty 1

## 2014-01-19 MED ORDER — EPINEPHRINE HCL 1 MG/ML IJ SOLN
INTRAOCULAR | Status: DC | PRN
  Administered 2014-01-19: 11:00:00

## 2014-01-19 MED ORDER — CYCLOPENTOLATE HCL 1 % OP SOLN
1.0000 [drp] | OPHTHALMIC | Status: AC | PRN
  Administered 2014-01-19 (×3): 1 [drp] via OPHTHALMIC
  Filled 2014-01-19: qty 2

## 2014-01-19 MED ORDER — BSS IO SOLN
INTRAOCULAR | Status: AC
  Filled 2014-01-19: qty 15

## 2014-01-19 MED ORDER — HYDROCODONE-ACETAMINOPHEN 10-325 MG PO TABS: 1.0000 | ORAL_TABLET | Freq: Three times a day (TID) | ORAL | Status: DC | PRN

## 2014-01-19 MED ORDER — PROPOFOL INFUSION 10 MG/ML OPTIME
INTRAVENOUS | Status: DC | PRN
  Administered 2014-01-19 (×2): 25 ug/kg/min via INTRAVENOUS

## 2014-01-19 MED ORDER — INSULIN GLARGINE 100 UNIT/ML ~~LOC~~ SOLN
30.0000 [IU] | Freq: Every day | SUBCUTANEOUS | Status: DC
  Administered 2014-01-20: 30 [IU] via SUBCUTANEOUS
  Filled 2014-01-19: qty 0.3

## 2014-01-19 MED ORDER — GABAPENTIN 300 MG PO CAPS
300.0000 mg | ORAL_CAPSULE | Freq: Three times a day (TID) | ORAL | Status: DC
  Administered 2014-01-19 – 2014-01-20 (×2): 300 mg via ORAL
  Filled 2014-01-19 (×4): qty 1

## 2014-01-19 MED ORDER — POLYMYXIN B SULFATE 500000 UNITS IJ SOLR
INTRAMUSCULAR | Status: DC | PRN
  Administered 2014-01-19: 11:00:00

## 2014-01-19 MED ORDER — INSULIN GLARGINE 100 UNIT/ML SOLOSTAR PEN: 30.0000 [IU] | PEN_INJECTOR | Freq: Every day | SUBCUTANEOUS | Status: DC

## 2014-01-19 MED ORDER — DILTIAZEM HCL ER 240 MG PO CP24
240.0000 mg | ORAL_CAPSULE | Freq: Two times a day (BID) | ORAL | Status: DC
  Administered 2014-01-19 – 2014-01-20 (×2): 240 mg via ORAL
  Filled 2014-01-19 (×3): qty 1

## 2014-01-19 MED ORDER — ATROPINE SULFATE 1 % OP SOLN
OPHTHALMIC | Status: AC
  Filled 2014-01-19: qty 2

## 2014-01-19 MED ORDER — DEXAMETHASONE SODIUM PHOSPHATE 10 MG/ML IJ SOLN
INTRAMUSCULAR | Status: DC | PRN
  Administered 2014-01-19: 10 mg via INTRAVENOUS

## 2014-01-19 MED ORDER — BACITRACIN-POLYMYXIN B 500-10000 UNIT/GM OP OINT
TOPICAL_OINTMENT | OPHTHALMIC | Status: AC
  Filled 2014-01-19: qty 3.5

## 2014-01-19 MED ORDER — SODIUM CHLORIDE 0.9 % IV SOLN
INTRAVENOUS | Status: DC
  Administered 2014-01-19: 10:00:00 via INTRAVENOUS

## 2014-01-19 MED ORDER — SODIUM HYALURONATE 10 MG/ML IO SOLN
INTRAOCULAR | Status: DC | PRN
  Administered 2014-01-19: 0.85 mL via INTRAOCULAR

## 2014-01-19 MED ORDER — PREDNISOLONE ACETATE 1 % OP SUSP
1.0000 [drp] | Freq: Four times a day (QID) | OPHTHALMIC | Status: DC
  Administered 2014-01-20: 1 [drp] via OPHTHALMIC
  Filled 2014-01-19: qty 1

## 2014-01-19 MED ORDER — HYALURONIDASE HUMAN 150 UNIT/ML IJ SOLN
INTRAMUSCULAR | Status: AC
  Filled 2014-01-19: qty 1

## 2014-01-19 MED ORDER — PREDNISONE 5 MG PO TABS
5.0000 mg | ORAL_TABLET | Freq: Every day | ORAL | Status: DC
  Administered 2014-01-20: 5 mg via ORAL
  Filled 2014-01-19 (×2): qty 1

## 2014-01-19 MED ORDER — GLIPIZIDE-METFORMIN HCL 2.5-500 MG PO TABS: 2.0000 | ORAL_TABLET | Freq: Two times a day (BID) | ORAL | Status: DC

## 2014-01-19 MED ORDER — POLYETHYLENE GLYCOL 3350 17 G PO PACK
17.0000 g | PACK | Freq: Every day | ORAL | Status: DC
  Administered 2014-01-19: 17 g via ORAL
  Filled 2014-01-19 (×2): qty 1

## 2014-01-19 MED ORDER — LATANOPROST 0.005 % OP SOLN
1.0000 [drp] | Freq: Every day | OPHTHALMIC | Status: DC
  Filled 2014-01-19: qty 2.5

## 2014-01-19 MED ORDER — MIRABEGRON ER 25 MG PO TB24
25.0000 mg | ORAL_TABLET | Freq: Every day | ORAL | Status: DC
  Administered 2014-01-19 – 2014-01-20 (×2): 25 mg via ORAL
  Filled 2014-01-19 (×2): qty 1

## 2014-01-19 MED ORDER — SODIUM CHLORIDE 0.9 % IV SOLN
INTRAVENOUS | Status: DC | PRN
  Administered 2014-01-19 (×2): via INTRAVENOUS

## 2014-01-19 MED ORDER — HYDROCHLOROTHIAZIDE 25 MG PO TABS
25.0000 mg | ORAL_TABLET | Freq: Every day | ORAL | Status: DC
  Administered 2014-01-19 – 2014-01-20 (×2): 25 mg via ORAL
  Filled 2014-01-19 (×2): qty 1

## 2014-01-19 MED ORDER — PHENYLEPHRINE HCL 2.5 % OP SOLN
1.0000 [drp] | OPHTHALMIC | Status: AC | PRN
  Administered 2014-01-19 (×3): 1 [drp] via OPHTHALMIC
  Filled 2014-01-19: qty 15

## 2014-01-19 MED ORDER — EPINEPHRINE HCL 1 MG/ML IJ SOLN
INTRAMUSCULAR | Status: AC
  Filled 2014-01-19: qty 1

## 2014-01-19 MED ORDER — LORATADINE 10 MG PO TABS
10.0000 mg | ORAL_TABLET | Freq: Every day | ORAL | Status: DC
  Administered 2014-01-19 – 2014-01-20 (×2): 10 mg via ORAL
  Filled 2014-01-19 (×2): qty 1

## 2014-01-19 MED ORDER — DEXAMETHASONE SODIUM PHOSPHATE 10 MG/ML IJ SOLN
INTRAMUSCULAR | Status: AC
  Filled 2014-01-19: qty 1

## 2014-01-19 MED ORDER — HYDROCODONE-ACETAMINOPHEN 5-325 MG PO TABS
1.0000 | ORAL_TABLET | ORAL | Status: DC | PRN
Start: 2014-01-19 — End: 2014-01-20
  Administered 2014-01-19: 1 via ORAL
  Filled 2014-01-19: qty 1

## 2014-01-19 MED ORDER — OXYBUTYNIN CHLORIDE ER 10 MG PO TB24
10.0000 mg | ORAL_TABLET | Freq: Every day | ORAL | Status: DC
  Administered 2014-01-19 – 2014-01-20 (×2): 10 mg via ORAL
  Filled 2014-01-19 (×2): qty 1

## 2014-01-19 MED ORDER — FLUTICASONE PROPIONATE 50 MCG/ACT NA SUSP
1.0000 | Freq: Every day | NASAL | Status: DC | PRN
  Filled 2014-01-19: qty 16

## 2014-01-19 MED ORDER — GATIFLOXACIN 0.5 % OP SOLN
1.0000 [drp] | Freq: Four times a day (QID) | OPHTHALMIC | Status: DC
Start: 2014-01-20 — End: 2014-01-20
  Administered 2014-01-20: 1 [drp] via OPHTHALMIC
  Filled 2014-01-19: qty 2.5

## 2014-01-19 MED ORDER — MEPERIDINE HCL 25 MG/ML IJ SOLN: 6.2500 mg | INTRAMUSCULAR | Status: DC | PRN

## 2014-01-19 MED ORDER — CLONAZEPAM 0.5 MG PO TABS: 0.5000 mg | ORAL_TABLET | Freq: Three times a day (TID) | ORAL | Status: DC | PRN

## 2014-01-19 MED ORDER — INSULIN ASPART 100 UNIT/ML ~~LOC~~ SOLN
0.0000 [IU] | SUBCUTANEOUS | Status: DC
  Administered 2014-01-19: 2 [IU] via SUBCUTANEOUS
  Administered 2014-01-19: 8 [IU] via SUBCUTANEOUS
  Administered 2014-01-20: 2 [IU] via SUBCUTANEOUS
  Administered 2014-01-20: 3 [IU] via SUBCUTANEOUS

## 2014-01-19 MED ORDER — GATIFLOXACIN 0.5 % OP SOLN
1.0000 [drp] | OPHTHALMIC | Status: AC | PRN
  Administered 2014-01-19 (×3): 1 [drp] via OPHTHALMIC
  Filled 2014-01-19: qty 2.5

## 2014-01-19 MED ORDER — HYDROMORPHONE HCL PF 1 MG/ML IJ SOLN: 0.2500 mg | INTRAMUSCULAR | Status: DC | PRN

## 2014-01-19 MED ORDER — DEXTROSE 50 % IV SOLN
INTRAVENOUS | Status: AC
  Filled 2014-01-19: qty 50

## 2014-01-19 MED ORDER — LISINOPRIL 40 MG PO TABS
40.0000 mg | ORAL_TABLET | Freq: Every day | ORAL | Status: DC
  Administered 2014-01-19 – 2014-01-20 (×2): 40 mg via ORAL
  Filled 2014-01-19 (×2): qty 1

## 2014-01-19 MED ORDER — PROMETHAZINE HCL 25 MG/ML IJ SOLN
INTRAMUSCULAR | Status: AC
  Administered 2014-01-19: 6.25 mg
  Filled 2014-01-19: qty 1

## 2014-01-19 MED ORDER — PROMETHAZINE HCL 25 MG PO TABS: 25.0000 mg | ORAL_TABLET | Freq: Every day | ORAL | Status: DC | PRN

## 2014-01-19 MED ORDER — TRIAMCINOLONE ACETONIDE 40 MG/ML IJ SUSP
INTRAMUSCULAR | Status: DC | PRN
  Administered 2014-01-19: 40 mg via INTRAMUSCULAR

## 2014-01-19 MED ORDER — ADULT MULTIVITAMIN W/MINERALS CH
1.0000 | ORAL_TABLET | Freq: Every day | ORAL | Status: DC
  Administered 2014-01-19: 1 via ORAL
  Filled 2014-01-19 (×2): qty 1

## 2014-01-19 MED ORDER — SODIUM HYALURONATE 10 MG/ML IO SOLN
INTRAOCULAR | Status: AC
  Filled 2014-01-19: qty 0.85

## 2014-01-19 MED ORDER — ONDANSETRON HCL 4 MG/2ML IJ SOLN: 4.0000 mg | Freq: Four times a day (QID) | INTRAMUSCULAR | Status: DC | PRN

## 2014-01-19 MED ORDER — ATORVASTATIN CALCIUM 10 MG PO TABS
10.0000 mg | ORAL_TABLET | Freq: Every day | ORAL | Status: DC
  Administered 2014-01-19: 10 mg via ORAL
  Filled 2014-01-19 (×2): qty 1

## 2014-01-19 MED ORDER — BACITRACIN-POLYMYXIN B 500-10000 UNIT/GM OP OINT
1.0000 "application " | TOPICAL_OINTMENT | Freq: Four times a day (QID) | OPHTHALMIC | Status: DC
  Administered 2014-01-20: 1 via OPHTHALMIC
  Filled 2014-01-19: qty 3.5

## 2014-01-19 MED ORDER — BACITRACIN-POLYMYXIN B 500-10000 UNIT/GM OP OINT
TOPICAL_OINTMENT | OPHTHALMIC | Status: DC | PRN
  Administered 2014-01-19: 1 via OPHTHALMIC

## 2014-01-19 MED ORDER — ALBUTEROL SULFATE (2.5 MG/3ML) 0.083% IN NEBU: 2.5000 mg | INHALATION_SOLUTION | Freq: Two times a day (BID) | RESPIRATORY_TRACT | Status: DC | PRN

## 2014-01-19 MED ORDER — DEXTROSE 50 % IV SOLN
25.0000 mL | Freq: Once | INTRAVENOUS | Status: AC
  Administered 2014-01-19: 25 mL via INTRAVENOUS
  Filled 2014-01-19: qty 50

## 2014-01-19 MED ORDER — SODIUM CHLORIDE 0.9 % IJ SOLN
INTRAMUSCULAR | Status: AC
  Filled 2014-01-19: qty 10

## 2014-01-19 MED ORDER — TETRACAINE HCL 0.5 % OP SOLN
2.0000 [drp] | Freq: Once | OPHTHALMIC | Status: DC
  Filled 2014-01-19: qty 2

## 2014-01-19 MED ORDER — MIDAZOLAM HCL 5 MG/5ML IJ SOLN
INTRAMUSCULAR | Status: DC | PRN
  Administered 2014-01-19: 1 mg via INTRAVENOUS
  Administered 2014-01-19: 0.5 mg via INTRAVENOUS

## 2014-01-19 MED ORDER — BSS IO SOLN
INTRAOCULAR | Status: DC | PRN
  Administered 2014-01-19 (×2): 15 mL via INTRAOCULAR

## 2014-01-19 MED ORDER — ACETAZOLAMIDE SODIUM 500 MG IJ SOLR
INTRAMUSCULAR | Status: AC
  Filled 2014-01-19: qty 500

## 2014-01-19 MED ORDER — MAGNESIUM HYDROXIDE 400 MG/5ML PO SUSP
15.0000 mL | Freq: Four times a day (QID) | ORAL | Status: DC | PRN
Start: 2014-01-19 — End: 2014-01-20

## 2014-01-19 MED ORDER — BUPIVACAINE HCL (PF) 0.75 % IJ SOLN
INTRAMUSCULAR | Status: AC
  Filled 2014-01-19: qty 10

## 2014-01-19 MED ORDER — BRIMONIDINE TARTRATE 0.2 % OP SOLN: 1.0000 [drp] | Freq: Two times a day (BID) | OPHTHALMIC | Status: DC

## 2014-01-19 MED ORDER — MORPHINE SULFATE 2 MG/ML IJ SOLN: 1.0000 mg | INTRAMUSCULAR | Status: DC | PRN

## 2014-01-19 MED ORDER — TRIAMCINOLONE ACETONIDE 40 MG/ML IJ SUSP
INTRAMUSCULAR | Status: AC
  Filled 2014-01-19: qty 5

## 2014-01-19 MED ORDER — TIMOLOL MALEATE 0.5 % OP SOLN: 1.0000 [drp] | Freq: Two times a day (BID) | OPHTHALMIC | Status: DC

## 2014-01-19 MED ORDER — ONDANSETRON HCL 4 MG/2ML IJ SOLN
INTRAMUSCULAR | Status: DC | PRN
  Administered 2014-01-19: 4 mg via INTRAVENOUS

## 2014-01-19 MED ORDER — DOCUSATE SODIUM 100 MG PO CAPS
100.0000 mg | ORAL_CAPSULE | Freq: Two times a day (BID) | ORAL | Status: DC
  Administered 2014-01-19 – 2014-01-20 (×2): 100 mg via ORAL
  Filled 2014-01-19 (×2): qty 1

## 2014-01-19 MED ORDER — ALBUTEROL SULFATE HFA 108 (90 BASE) MCG/ACT IN AERS: 2.0000 | INHALATION_SPRAY | Freq: Two times a day (BID) | RESPIRATORY_TRACT | Status: DC | PRN

## 2014-01-19 MED ORDER — GENTAMICIN SULFATE 40 MG/ML IJ SOLN
INTRAMUSCULAR | Status: AC
  Filled 2014-01-19: qty 2

## 2014-01-19 MED ORDER — HYPROMELLOSE (GONIOSCOPIC) 2.5 % OP SOLN
OPHTHALMIC | Status: AC
  Filled 2014-01-19: qty 15

## 2014-01-19 MED ORDER — BUPIVACAINE HCL (PF) 0.75 % IJ SOLN
INTRAMUSCULAR | Status: DC | PRN
  Administered 2014-01-19: 11:00:00 via RETROBULBAR

## 2014-01-19 MED ORDER — SODIUM CHLORIDE 0.45 % IV SOLN
INTRAVENOUS | Status: DC
  Administered 2014-01-19: 20 mL/h via INTRAVENOUS

## 2014-01-19 MED ORDER — ONDANSETRON HCL 4 MG/2ML IJ SOLN
INTRAMUSCULAR | Status: AC
  Filled 2014-01-19: qty 2

## 2014-01-19 MED ORDER — EPINEPHRINE HCL 1 MG/ML IJ SOLN
INTRAMUSCULAR | Status: DC | PRN
  Administered 2014-01-19: 1 mg

## 2014-01-19 MED ORDER — GLIPIZIDE 5 MG PO TABS
5.0000 mg | ORAL_TABLET | Freq: Two times a day (BID) | ORAL | Status: DC
  Administered 2014-01-19 – 2014-01-20 (×2): 5 mg via ORAL
  Filled 2014-01-19 (×4): qty 1

## 2014-01-19 MED ORDER — OXYCODONE HCL 5 MG/5ML PO SOLN: 5.0000 mg | Freq: Once | ORAL | Status: DC | PRN

## 2014-01-19 MED ORDER — ACETAMINOPHEN 325 MG PO TABS: 325.0000 mg | ORAL_TABLET | ORAL | Status: DC | PRN

## 2014-01-19 MED ORDER — ONDANSETRON HCL 4 MG/2ML IJ SOLN
4.0000 mg | Freq: Once | INTRAMUSCULAR | Status: AC | PRN
  Administered 2014-01-19: 4 mg via INTRAVENOUS

## 2014-01-19 MED ORDER — POLYMYXIN B SULFATE 500000 UNITS IJ SOLR
INTRAMUSCULAR | Status: AC
  Filled 2014-01-19: qty 1

## 2014-01-19 MED ORDER — LIDOCAINE HCL 2 % IJ SOLN
INTRAMUSCULAR | Status: AC
  Filled 2014-01-19: qty 20

## 2014-01-19 MED ORDER — PYRIDOSTIGMINE BROMIDE 60 MG PO TABS
60.0000 mg | ORAL_TABLET | Freq: Three times a day (TID) | ORAL | Status: DC
  Administered 2014-01-19 – 2014-01-20 (×2): 60 mg via ORAL
  Filled 2014-01-19 (×5): qty 1

## 2014-01-19 MED ORDER — BSS PLUS IO SOLN
INTRAOCULAR | Status: AC
  Filled 2014-01-19: qty 500

## 2014-01-19 MED ORDER — PROPOFOL 10 MG/ML IV BOLUS
INTRAVENOUS | Status: DC | PRN
  Administered 2014-01-19: 100 mg via INTRAVENOUS

## 2014-01-19 MED ORDER — TEMAZEPAM 15 MG PO CAPS: 15.0000 mg | ORAL_CAPSULE | Freq: Every evening | ORAL | Status: DC | PRN

## 2014-01-19 MED ORDER — CYCLOSPORINE 0.05 % OP EMUL
1.0000 [drp] | Freq: Two times a day (BID) | OPHTHALMIC | Status: DC
  Filled 2014-01-19 (×4): qty 1

## 2014-01-19 MED ORDER — TROPICAMIDE 1 % OP SOLN
1.0000 [drp] | OPHTHALMIC | Status: AC | PRN
  Administered 2014-01-19 (×3): 1 [drp] via OPHTHALMIC
  Filled 2014-01-19: qty 3

## 2014-01-19 MED ORDER — ONDANSETRON HCL 4 MG PO TABS: 4.0000 mg | ORAL_TABLET | Freq: Every day | ORAL | Status: DC | PRN

## 2014-01-19 MED ORDER — METFORMIN HCL 500 MG PO TABS
1000.0000 mg | ORAL_TABLET | Freq: Two times a day (BID) | ORAL | Status: DC
  Administered 2014-01-19 – 2014-01-20 (×2): 1000 mg via ORAL
  Filled 2014-01-19 (×4): qty 2

## 2014-01-19 MED ORDER — STERILE WATER FOR IRRIGATION IR SOLN
Status: DC | PRN
  Administered 2014-01-19: 200 mL

## 2014-01-19 MED ORDER — FENTANYL CITRATE 0.05 MG/ML IJ SOLN
INTRAMUSCULAR | Status: DC | PRN
  Administered 2014-01-19 (×2): 50 ug via INTRAVENOUS

## 2014-01-19 MED ORDER — LOSARTAN POTASSIUM 50 MG PO TABS
100.0000 mg | ORAL_TABLET | Freq: Every day | ORAL | Status: DC
  Administered 2014-01-19 – 2014-01-20 (×2): 100 mg via ORAL
  Filled 2014-01-19 (×2): qty 2

## 2014-01-19 MED ORDER — 0.9 % SODIUM CHLORIDE (POUR BTL) OPTIME
TOPICAL | Status: DC | PRN
  Administered 2014-01-19: 200 mL

## 2014-01-19 MED ORDER — PANTOPRAZOLE SODIUM 40 MG PO TBEC
40.0000 mg | DELAYED_RELEASE_TABLET | Freq: Every day | ORAL | Status: DC
  Administered 2014-01-19 – 2014-01-20 (×2): 40 mg via ORAL
  Filled 2014-01-19 (×2): qty 1

## 2014-01-19 MED ORDER — ATROPINE SULFATE 1 % OP SOLN
OPHTHALMIC | Status: DC | PRN
  Administered 2014-01-19: 1 [drp]

## 2014-01-19 MED ORDER — EPHEDRINE SULFATE 50 MG/ML IJ SOLN
INTRAMUSCULAR | Status: DC | PRN
  Administered 2014-01-19 (×2): 5 mg via INTRAVENOUS

## 2014-01-19 MED ORDER — OXYCODONE HCL 5 MG PO TABS: 5.0000 mg | ORAL_TABLET | Freq: Once | ORAL | Status: DC | PRN

## 2014-01-19 MED ORDER — BRIMONIDINE TARTRATE 0.2 % OP SOLN
1.0000 [drp] | Freq: Two times a day (BID) | OPHTHALMIC | Status: DC
  Filled 2014-01-19 (×2): qty 5

## 2014-01-19 MED ORDER — ACETAZOLAMIDE SODIUM 500 MG IJ SOLR
500.0000 mg | Freq: Once | INTRAMUSCULAR | Status: AC
  Administered 2014-01-20: 500 mg via INTRAVENOUS
  Filled 2014-01-19: qty 500

## 2014-01-19 MED ORDER — VENLAFAXINE HCL ER 150 MG PO CP24
150.0000 mg | ORAL_CAPSULE | Freq: Every day | ORAL | Status: DC
  Administered 2014-01-19 – 2014-01-20 (×2): 150 mg via ORAL
  Filled 2014-01-19 (×2): qty 1

## 2014-01-19 MED ORDER — TEMAZEPAM 15 MG PO CAPS: 30.0000 mg | ORAL_CAPSULE | Freq: Every evening | ORAL | Status: DC | PRN

## 2014-01-19 MED ORDER — BRIMONIDINE TARTRATE-TIMOLOL 0.2-0.5 % OP SOLN: 1.0000 [drp] | Freq: Two times a day (BID) | OPHTHALMIC | Status: DC

## 2014-01-19 MED ORDER — AMLODIPINE BESYLATE 5 MG PO TABS
5.0000 mg | ORAL_TABLET | Freq: Every day | ORAL | Status: DC
  Administered 2014-01-20: 5 mg via ORAL
  Filled 2014-01-19: qty 1

## 2014-01-19 SURGICAL SUPPLY — 73 items
APL SRG 3 HI ABS STRL LF PLS (MISCELLANEOUS)
APPLICATOR DR MATTHEWS STRL (MISCELLANEOUS) IMPLANT
BALL CTTN LRG ABS STRL LF (GAUZE/BANDAGES/DRESSINGS) ×3
BLADE EYE CATARACT 19 1.4 BEAV (BLADE) IMPLANT
BLADE MVR KNIFE 19G (BLADE) IMPLANT
BLADE MVR KNIFE 20G (BLADE) IMPLANT
CANNULA DUAL BORE 23G (CANNULA) IMPLANT
CANNULA VLV SOFT TIP 25G (OPHTHALMIC) ×1 IMPLANT
CANNULA VLV SOFT TIP 25GA (OPHTHALMIC) ×6 IMPLANT
CORDS BIPOLAR (ELECTRODE) ×3 IMPLANT
COTTONBALL LRG STERILE PKG (GAUZE/BANDAGES/DRESSINGS) ×9 IMPLANT
COVER MAYO STAND STRL (DRAPES) ×5 IMPLANT
DRAPE INCISE 51X51 W/FILM STRL (DRAPES) IMPLANT
DRAPE OPHTHALMIC 77X100 STRL (CUSTOM PROCEDURE TRAY) ×3 IMPLANT
FILTER BLUE MILLIPORE (MISCELLANEOUS) IMPLANT
FILTER STRAW FLUID ASPIR (MISCELLANEOUS) ×4 IMPLANT
FORCEPS ECKARDT ILM 25G SERR (OPHTHALMIC RELATED) IMPLANT
FORCEPS GRIESHABER ILM 25G A (INSTRUMENTS) IMPLANT
FORCEPS ILM 25G DSP TIP (MISCELLANEOUS) IMPLANT
GAS AUTO FILL CONSTEL (OPHTHALMIC) ×6
GAS AUTO FILL CONSTELLATION (OPHTHALMIC) IMPLANT
GAS OPHTHALMIC (MISCELLANEOUS) ×2 IMPLANT
GLOVE SS BIOGEL STRL SZ 6.5 (GLOVE) ×1 IMPLANT
GLOVE SS BIOGEL STRL SZ 7 (GLOVE) ×1 IMPLANT
GLOVE SUPERSENSE BIOGEL SZ 6.5 (GLOVE) ×4
GLOVE SUPERSENSE BIOGEL SZ 7 (GLOVE) ×2
GLOVE SURG 8.5 LATEX PF (GLOVE) ×5 IMPLANT
GLOVE SURG SS PI 6.0 STRL IVOR (GLOVE) ×2 IMPLANT
GLOVE SURG SS PI 7.0 STRL IVOR (GLOVE) ×4 IMPLANT
GOWN STRL NON-REIN LRG LVL3 (GOWN DISPOSABLE) ×13 IMPLANT
HANDLE PNEUMATIC FOR CONSTEL (OPHTHALMIC) IMPLANT
KIT BASIN OR (CUSTOM PROCEDURE TRAY) ×3 IMPLANT
KIT ROOM TURNOVER OR (KITS) ×3 IMPLANT
MICROPICK 25G (MISCELLANEOUS)
NDL 18GX1X1/2 (RX/OR ONLY) (NEEDLE) ×1 IMPLANT
NDL 25GX 5/8IN NON SAFETY (NEEDLE) ×1 IMPLANT
NDL FILTER BLUNT 18X1 1/2 (NEEDLE) ×1 IMPLANT
NDL HYPO 30X.5 LL (NEEDLE) ×2 IMPLANT
NEEDLE 18GX1X1/2 (RX/OR ONLY) (NEEDLE) ×6 IMPLANT
NEEDLE 25GX 5/8IN NON SAFETY (NEEDLE) ×3 IMPLANT
NEEDLE 27GAX1X1/2 (NEEDLE) IMPLANT
NEEDLE FILTER BLUNT 18X 1/2SAF (NEEDLE) ×4
NEEDLE FILTER BLUNT 18X1 1/2 (NEEDLE) ×2 IMPLANT
NEEDLE HYPO 30X.5 LL (NEEDLE) ×9 IMPLANT
NS IRRIG 1000ML POUR BTL (IV SOLUTION) ×3 IMPLANT
PACK VITRECTOMY CUSTOM (CUSTOM PROCEDURE TRAY) ×3 IMPLANT
PAD ARMBOARD 7.5X6 YLW CONV (MISCELLANEOUS) ×6 IMPLANT
PAK PIK VITRECTOMY CVS 25GA (OPHTHALMIC) ×3 IMPLANT
PENCIL BIPOLAR 25GA STR DISP (OPHTHALMIC RELATED) IMPLANT
PIC ILLUMINATED 25G (OPHTHALMIC) ×3
PICK MICROPICK 25G (MISCELLANEOUS) IMPLANT
PIK ILLUMINATED 25G (OPHTHALMIC) ×1 IMPLANT
PROBE LASER ILLUM FLEX CVD 25G (OPHTHALMIC) ×3 IMPLANT
REPL STRA BRUSH NDL (NEEDLE) IMPLANT
REPL STRA BRUSH NEEDLE (NEEDLE) ×3 IMPLANT
RESERVOIR BACK FLUSH (MISCELLANEOUS) ×2 IMPLANT
ROLLS DENTAL (MISCELLANEOUS) ×6 IMPLANT
SCRAPER DIAMOND DUST MEMBRANE (MISCELLANEOUS) ×3 IMPLANT
SPONGE SURGIFOAM ABS GEL 12-7 (HEMOSTASIS) ×3 IMPLANT
STOPCOCK 4 WAY LG BORE MALE ST (IV SETS) IMPLANT
SUT CHROMIC 7 0 TG140 8 (SUTURE) IMPLANT
SUT ETHILON 10 0 CS140 6 (SUTURE) IMPLANT
SUT ETHILON 9 0 TG140 8 (SUTURE) ×3 IMPLANT
SUT POLY NON ABSORB 10-0 8 STR (SUTURE) IMPLANT
SUT SILK 4 0 RB 1 (SUTURE) IMPLANT
SYR 20CC LL (SYRINGE) ×3 IMPLANT
SYR BULB 3OZ (MISCELLANEOUS) ×3 IMPLANT
SYR TB 1ML LUER SLIP (SYRINGE) ×5 IMPLANT
SYRINGE 10CC LL (SYRINGE) IMPLANT
TOWEL OR 17X24 6PK STRL BLUE (TOWEL DISPOSABLE) ×3 IMPLANT
TOWEL OR 17X26 10 PK STRL BLUE (TOWEL DISPOSABLE) ×3 IMPLANT
WATER STERILE IRR 1000ML POUR (IV SOLUTION) ×3 IMPLANT
WIPE INSTRUMENT VISIWIPE 73X73 (MISCELLANEOUS) ×3 IMPLANT

## 2014-01-19 NOTE — Preoperative (Signed)
Beta Blockers   Reason not to administer Beta Blockers:Not Applicable 

## 2014-01-19 NOTE — H&P (Signed)
I examined the patient today and there is no change in the medical status 

## 2014-01-19 NOTE — Brief Op Note (Signed)
Brief Operative note   Preoperative diagnosis:  Macular hole left eye Postoperative diagnosis  Post-Op Diagnosis Codes:    * Macular cyst, hole, or pseudohole of retina [362.54]  Procedures: Repair macular hole left eye with pars plana vitrectomy, laser, serum patch, gas injection  Surgeon:  Hayden Pedro, MD...  Assistant:  Deatra Ina SA    Anesthesia: General  Specimen: none  Estimated blood loss:  1cc  Complications: none  Patient sent to PACU in good condition  Composed by Hayden Pedro MD  Dictation number: [phone]

## 2014-01-19 NOTE — Transfer of Care (Signed)
Immediate Anesthesia Transfer of Care Note  Patient: [name]  Procedure(s) Performed: Procedure(s): 25 GAUGE PARS PLANA VITRECTOMY WITH 20 GAUGE MVR PORT (Left)  Patient Location: PACU  Anesthesia Type:General  Level of Consciousness: awake, sedated and patient cooperative  Airway & Oxygen Therapy: Patient Spontanous Breathing and Patient connected to face mask oxygen  Post-op Assessment: Report given to PACU RN and Post -op Vital signs reviewed and stable  Post vital signs: Reviewed and stable  Complications: No apparent anesthesia complications

## 2014-01-19 NOTE — Anesthesia Preprocedure Evaluation (Addendum)
Anesthesia Evaluation  Patient identified by MRN, date of birth, ID band Patient awake    Reviewed: Allergy & Precautions, H&P , NPO status , Patient's Chart, lab work & pertinent test results  Airway Mallampati: I TM Distance: >3 FB Neck ROM: Full    Dental  (+) Teeth Intact and Dental Advisory Given   Pulmonary shortness of breath, sleep apnea , pneumonia -, resolved, former smoker,    Pulmonary exam normal       Cardiovascular Exercise Tolerance: Poor hypertension, Pt. on medications  11-08-09- ECHO  --------------------------------------------------------------------  Study Conclusions   1. Left ventricle: The cavity size was normal. Mild septal     hypertrophy. Systolic function was vigorous. The estimated     ejection fraction was in the range of 65% to 70%. Wall motion was     normal; there were no regional wall motion abnormalities.  2. Left atrium: The atrium was mildly dilated.  3. Pulmonary arteries: PA peak pressure: 32mm Hg (S).  Impressions:   - Compared to   Neuro/Psych  Headaches, Anxiety Depression H/O Myesthenia Gravis on Mestinon  Neuromuscular disease    GI/Hepatic PUD, GERD-  Medicated,  Endo/Other  diabetes, Poorly Controlled, Type 2, Oral Hypoglycemic Agents and Insulin DependentHypothyroidism   Renal/GU CRF and Renal InsufficiencyRenal disease     Musculoskeletal   Abdominal   Peds  Hematology  (+) anemia ,   Anesthesia Other Findings   Reproductive/Obstetrics                      Anesthesia Physical Anesthesia Plan  ASA: III  Anesthesia Plan: MAC   Post-op Pain Management:    Induction: Intravenous  Airway Management Planned: Natural Airway  Additional Equipment:   Intra-op Plan:   Post-operative Plan:   Informed Consent: I have reviewed the patients History and Physical, chart, labs and discussed the procedure including the risks, benefits and  alternatives for the proposed anesthesia with the patient or authorized representative who has indicated his/her understanding and acceptance.   Dental advisory given  Plan Discussed with: CRNA and Surgeon  Anesthesia Plan Comments:       Anesthesia Quick Evaluation

## 2014-01-19 NOTE — Progress Notes (Signed)
Pt with Myesthenia Gravis on mestinon treated by an Ophthalmologist at [facility]. In discussion with Dr Zigmund Daniel it was decided to attempt the procedure under sedation with retrobulbar block. After the block was placed she would not lay still despite adequate sedation and we decided to proceed to general anesthesia without muscle relaxants. Immediately after the induction of the GA, she regurgitated stomach contents.  She was immediately placed in Trendelenberg, suctioned and intubated. Her saturation decreased into the 70's. I ordered a chest xray which was unchanged from preop and an ABG which showed some shunt but her PO2 was 117.  In discussion with Dr Chriss Driver and Dr Tamala Julian who advised me to proceed with the surgery, we decided to do the surgery. She awoke uneventfully and was transported to the PACU with a saturation in the 90's. Clinically she does not have signs of aspiration.  She will be followed overnight by Dr Zigmund Daniel.   Lillia Abed MD

## 2014-01-19 NOTE — Anesthesia Procedure Notes (Addendum)
Procedure Name: Intubation and MAC Date/Time: 01/19/2014 12:15 PM Performed by: Wanita Chamberlain Pre-anesthesia Checklist: Patient identified, Timeout performed, Patient being monitored, Suction available and Emergency Drugs available Patient Re-evaluated:Patient Re-evaluated prior to inductionOxygen Delivery Method: Circle system utilized Preoxygenation: Pre-oxygenation with 100% oxygen Intubation Type: IV induction Laryngoscope Size: Mac and 3 Grade View: Grade II Tube type: Oral Tube size: 7.5 mm Number of attempts: 2 Airway Equipment and Method: Stylet Placement Confirmation: ETT inserted through vocal cords under direct vision,  positive ETCO2 and breath sounds checked- equal and bilateral Secured at: 21 cm Tube secured with: Tape Dental Injury: Teeth and Oropharynx as per pre-operative assessment     Performed by: Laureen Ochs F Preoxygenation: Pt with yellow/green liquid in oropharynx.

## 2014-01-19 NOTE — Progress Notes (Signed)
Patient desaturated into the 87 range.  Patient encouraged to take deep breaths.  Patient unable to communicate with nursing staff, except by making loud monosyllabic noises. Pt earlier knew she was in the hospital, but will not answer any questions at this time

## 2014-01-20 LAB — GLUCOSE, CAPILLARY
GLUCOSE-CAPILLARY: 133 mg/dL — AB (ref 70–99)
Glucose-Capillary: 168 mg/dL — ABNORMAL HIGH (ref 70–99)

## 2014-01-20 MED ORDER — PREDNISOLONE ACETATE 1 % OP SUSP: 1.0000 [drp] | Freq: Four times a day (QID) | OPHTHALMIC | Status: DC

## 2014-01-20 MED ORDER — BACITRACIN-POLYMYXIN B 500-10000 UNIT/GM OP OINT: 1.0000 "application " | TOPICAL_OINTMENT | Freq: Four times a day (QID) | OPHTHALMIC | Status: DC

## 2014-01-20 MED ORDER — GATIFLOXACIN 0.5 % OP SOLN: 1.0000 [drp] | Freq: Four times a day (QID) | OPHTHALMIC | Status: DC

## 2014-01-20 NOTE — Progress Notes (Signed)
01/20/2014, 6:40 AM  Mental Status:  Awake, Alert, Oriented  Anterior segment: Cornea  Clear    Anterior Chamber Clear    Lens:    IOL  Intra Ocular Pressure 17 mmHg with Tonopen  Vitreous: Clear 95%gas bubble   Retina:  Attached Good laser reaction   Impression: Excellent result Retina attached   Final Diagnosis: Active Problems:   Macular hole of left eye   Plan: start post operative eye drops.  Discharge to home.  Give post operative instructions  [name] 01/20/2014, 6:40 AM

## 2014-01-20 NOTE — Progress Notes (Signed)
Discharge instructions reviewed with pt and pt's daughter and pt has eye bag with eye drops and eye shield.  Pt and pt's daugther verbalized understanding and had no questions.  Pt discharged in stable condition via wheelchair with daughter.  Eliezer Bottom [place]

## 2014-01-20 NOTE — Anesthesia Postprocedure Evaluation (Signed)
Anesthesia Post Note  Patient: [name]  Procedure(s) Performed: Procedure(s) (LRB): 25 GAUGE PARS PLANA VITRECTOMY WITH 20 GAUGE MVR PORT; MEMBRAME PEEL; SERUM PATCH; LASER TREATMENT; C3F8 (Left)  Anesthesia type: general  Patient location: PACU  Post pain: Pain level controlled  Post assessment: Patient's Cardiovascular Status Stable  Last Vitals:  Filed Vitals:   01/20/14 0945  BP: 134/43  Pulse: 68  Temp: 36.7 C  Resp: 18    Post vital signs: Reviewed and stable  Level of consciousness: sedated  Complications: No apparent anesthesia complications

## 2014-01-20 NOTE — Op Note (Signed)
NAMEDEBBY, [name] NO.:  [id number]  MEDICAL RECORD NO.:  [id number]  LOCATION:  [id number]                        FACILITY:  [facility]  PHYSICIAN:  Chrystie Nose. Zigmund Daniel, M.D. DATE OF BIRTH:  [birth year]  DATE OF PROCEDURE:  01/19/2014 DATE OF DISCHARGE:                              OPERATIVE REPORT   ADMISSION DIAGNOSIS:  Macular hole, left eye.  PROCEDURES:  Pars plana vitrectomy, retinal photocoagulation, membrane peel, serum patch, gas fluid exchange, all in the left eye.  SURGEON:  Chrystie Nose. Zigmund Daniel, M.D.  ASSISTANT:  Deatra Ina SA.  ANESTHESIA:  Started out as MAC and was converted to general prior to any incision or laser treatment.  DESCRIPTION OF PROCEDURE:  Anesthesiologist, Dr. Lennette Bihari D. Ossey, recommended MAC for this case because of the patient's muscular weakness.  Prior to the prep and drape, the patient was given some IV sedation and a retrobulbar block with 2% lidocaine plain and 0.75% Marcaine plain with Wydase was given in a [employer] manner behind the eye and into the eyelids above and below.  The patient tolerated the retrobulbar block quite well.  However, after depression on the globe for the appropriate time, the patient began to be very agitated turning her head from right to left, seeming to be quite confused.  She lifted her shoulders, lifted her hands, tried to sit up.  At that point, it was determined by Anesthesia that the general anesthetic would be appropriate, surgeon agreed.  General endotracheal anesthesia was obtained.  At this point, the usual prep and drape was performed.  The indirect ophthalmoscope laser was moved into place, 737 burns were placed around the retinal periphery.  The power was 400 mW, 1000 microns each and 0.1 seconds each.  Pars plana vitrectomy was begun just behind the pseudophakos.  A straight shot of 1:1000 epinephrine with diluted in 14 mL a BSS was injected into the anterior chamber with a 30-gauge needle,  approximately one-half of 1/10 mL was injected.  The pupil dilated immediately, 25-gauge trocars were placed at 10 o'clock and 4 o'clock.  A 3-layered MVR incision with 20-gauge was opened at 2 o'clock.  The contact lens ring was anchored into place at 6 and 12 o'clock.  The pars plana vitrectomy was begun just behind the pseudophakos.  Dense white membranes were encountered.  The vitrectomy was carried posteriorly in a core fashion until the macular hole became apparent.  There was traction on the disk and on the edges of the macular hole.  The diamond-dusted membrane scraper was used to engage the internal limiting membrane and peel it from its attachments to the edge of the hole and out toward the disk.  Kenalog was injected to mark the vitreous and additional vitrectomy was performed in this region. Then, the retinal surface was stripped of the internal limiting membrane.  The vitrectomy was widened into the mid periphery and far periphery with the Super wide BIOM viewing system.  Vitrectomy was carried down to the retinal periphery for 360 degrees.  The magnifying contact lens was then placed on the corneal surface and additional teasing of the internal limiting membrane and the edges of the macular hole was performed  with the diamond-dusted membrane scraper.  The edges of the hole were mobile and seem to go together nicely at this point.  A total gas fluid exchange was carried out.  Sufficient time was allowed for additional fluid to track down the walls of the eye and collect in the posterior segment.  The silicone tip suction line was used to remove additional fluid from the posterior segment.  The serum patch was delivered.  C3F8 and 15% concentration was exchanged for intravitreal gas.  The instruments were removed from the eye.  A 9-0 nylon was used to close the sclerotomy site.  The conjunctiva was closed with wet-field cautery.  Polymyxin and gentamicin were irrigated into  tenon space. Decadron 10 mg was injected to the lower subconjunctival space. Atropine solution was applied.  No Marcaine was used because of the prior block.  The closing pressure was 10 with a Baer care tonometer. Polysporin ophthalmic ointment, a patch and shield were placed.  The patient was awakened and taken to recovery in satisfactory condition.  COMPLICATIONS:  None.  DURATION:  2 hours.     Chrystie Nose. Zigmund Daniel, M.D.     JDM/MEDQ  D:  01/19/2014  T:  01/20/2014  Job:  [id number]

## 2014-01-20 NOTE — Discharge Summary (Signed)
Discharge summary not needed on [facility] patients per medical records. 

## 2014-01-21 ENCOUNTER — Encounter (HOSPITAL_COMMUNITY): Payer: Self-pay | Admitting: Ophthalmology

## 2014-01-21 LAB — POCT I-STAT 7, (LYTES, BLD GAS, ICA,H+H)
Acid-Base Excess: 1 mmol/L (ref 0.0–2.0)
BICARBONATE: 26.3 meq/L — AB (ref 20.0–24.0)
Calcium, Ion: 1.26 mmol/L (ref 1.13–1.30)
HEMATOCRIT: 28 % — AB (ref 36.0–46.0)
HEMOGLOBIN: 9.5 g/dL — AB (ref 12.0–15.0)
O2 Saturation: 98 %
PCO2 ART: 46.1 mmHg — AB (ref 35.0–45.0)
PH ART: 7.364 (ref 7.350–7.450)
Potassium: 3.6 mEq/L — ABNORMAL LOW (ref 3.7–5.3)
SODIUM: 140 meq/L (ref 137–147)
TCO2: 28 mmol/L (ref 0–100)
pO2, Arterial: 117 mmHg — ABNORMAL HIGH (ref 80.0–100.0)

## 2014-01-22 ENCOUNTER — Other Ambulatory Visit: Payer: Self-pay

## 2014-01-22 MED ORDER — HYDROCODONE-ACETAMINOPHEN 10-325 MG PO TABS: ORAL_TABLET | ORAL | Status: DC

## 2014-01-26 ENCOUNTER — Encounter (INDEPENDENT_AMBULATORY_CARE_PROVIDER_SITE_OTHER): Payer: Medicare Other | Admitting: Ophthalmology

## 2014-01-26 DIAGNOSIS — H35349 Macular cyst, hole, or pseudohole, unspecified eye: Secondary | ICD-10-CM

## 2014-02-03 ENCOUNTER — Other Ambulatory Visit: Payer: Self-pay | Admitting: Family Medicine

## 2014-02-16 ENCOUNTER — Encounter (INDEPENDENT_AMBULATORY_CARE_PROVIDER_SITE_OTHER): Payer: Medicare Other | Admitting: Ophthalmology

## 2014-02-17 ENCOUNTER — Encounter (INDEPENDENT_AMBULATORY_CARE_PROVIDER_SITE_OTHER): Payer: Medicare Other | Admitting: Ophthalmology

## 2014-02-17 DIAGNOSIS — H35349 Macular cyst, hole, or pseudohole, unspecified eye: Secondary | ICD-10-CM

## 2014-02-23 ENCOUNTER — Other Ambulatory Visit: Payer: Self-pay | Admitting: Family Medicine

## 2014-02-24 ENCOUNTER — Other Ambulatory Visit: Payer: Self-pay

## 2014-02-24 ENCOUNTER — Telehealth: Payer: Self-pay | Admitting: *Deleted

## 2014-02-24 MED ORDER — HYDROCODONE-ACETAMINOPHEN 10-325 MG PO TABS: ORAL_TABLET | ORAL | Status: DC

## 2014-02-24 NOTE — Telephone Encounter (Signed)
Request received for refill on Gabapentin 300 mg. Please advise

## 2014-03-01 ENCOUNTER — Other Ambulatory Visit: Payer: Self-pay | Admitting: *Deleted

## 2014-03-01 DIAGNOSIS — M792 Neuralgia and neuritis, unspecified: Secondary | ICD-10-CM

## 2014-03-01 MED ORDER — GABAPENTIN 300 MG PO CAPS: 300.0000 mg | ORAL_CAPSULE | Freq: Three times a day (TID) | ORAL | Status: DC

## 2014-03-01 NOTE — Telephone Encounter (Signed)
Refilled and sent electronically to [facility]

## 2014-03-01 NOTE — Telephone Encounter (Signed)
refill 

## 2014-03-22 ENCOUNTER — Other Ambulatory Visit: Payer: Self-pay

## 2014-03-22 MED ORDER — HYDROCODONE-ACETAMINOPHEN 10-325 MG PO TABS: ORAL_TABLET | ORAL | Status: DC

## 2014-03-23 ENCOUNTER — Other Ambulatory Visit: Payer: Self-pay | Admitting: Family Medicine

## 2014-04-01 ENCOUNTER — Other Ambulatory Visit: Payer: Self-pay | Admitting: Family Medicine

## 2014-04-01 DIAGNOSIS — Z1231 Encounter for screening mammogram for malignant neoplasm of breast: Secondary | ICD-10-CM

## 2014-04-02 ENCOUNTER — Other Ambulatory Visit: Payer: Self-pay | Admitting: Family Medicine

## 2014-04-15 ENCOUNTER — Ambulatory Visit (HOSPITAL_COMMUNITY)
Admission: RE | Admit: 2014-04-15 | Discharge: 2014-04-15 | Disposition: A | Discharge status: Home / Self Care | Payer: Medicare Other | Source: Ambulatory Visit | Attending: Family Medicine | Admitting: Family Medicine

## 2014-04-15 ENCOUNTER — Other Ambulatory Visit: Payer: Self-pay | Admitting: Family Medicine

## 2014-04-15 DIAGNOSIS — Z1231 Encounter for screening mammogram for malignant neoplasm of breast: Secondary | ICD-10-CM

## 2014-04-15 LAB — COMPLETE METABOLIC PANEL WITH GFR
ALT: 10 U/L (ref 0–35)
AST: 10 U/L (ref 0–37)
Albumin: 3.8 g/dL (ref 3.5–5.2)
Alkaline Phosphatase: 154 U/L — ABNORMAL HIGH (ref 39–117)
BUN: 19 mg/dL (ref 6–23)
CO2: 31 mEq/L (ref 19–32)
Calcium: 9.5 mg/dL (ref 8.4–10.5)
Chloride: 104 mEq/L (ref 96–112)
Creat: 0.9 mg/dL (ref 0.50–1.10)
GFR, Est African American: 74 mL/min
GFR, Est Non African American: 65 mL/min
Glucose, Bld: 81 mg/dL (ref 70–99)
Potassium: 4.8 mEq/L (ref 3.5–5.3)
Sodium: 139 mEq/L (ref 135–145)
Total Bilirubin: 0.4 mg/dL (ref 0.2–1.2)
Total Protein: 6.9 g/dL (ref 6.0–8.3)

## 2014-04-15 LAB — HEMOGLOBIN A1C
Hgb A1c MFr Bld: 7.7 % — ABNORMAL HIGH (ref <5.7)
Mean Plasma Glucose: 174 mg/dL — ABNORMAL HIGH (ref <117)

## 2014-04-15 LAB — LIPID PANEL
Cholesterol: 178 mg/dL (ref 0–200)
HDL: 57 mg/dL (ref >39)
LDL Cholesterol: 100 mg/dL — ABNORMAL HIGH (ref 0–99)
Total CHOL/HDL Ratio: 3.1 Ratio
Triglycerides: 105 mg/dL (ref <150)
VLDL: 21 mg/dL (ref 0–40)

## 2014-04-16 LAB — VITAMIN D 25 HYDROXY (VIT D DEFICIENCY, FRACTURES): Vit D, 25-Hydroxy: 14 ng/mL — ABNORMAL LOW (ref 30–89)

## 2014-04-20 ENCOUNTER — Encounter (INDEPENDENT_AMBULATORY_CARE_PROVIDER_SITE_OTHER): Payer: Self-pay

## 2014-04-20 ENCOUNTER — Ambulatory Visit (INDEPENDENT_AMBULATORY_CARE_PROVIDER_SITE_OTHER): Payer: Medicare Other | Admitting: Family Medicine

## 2014-04-20 VITALS — BP 144/72 | HR 66 | Resp 18 | Ht 66.0 in | Wt 235.1 lb

## 2014-04-20 DIAGNOSIS — F3289 Other specified depressive episodes: Secondary | ICD-10-CM

## 2014-04-20 DIAGNOSIS — R32 Unspecified urinary incontinence: Secondary | ICD-10-CM

## 2014-04-20 DIAGNOSIS — IMO0002 Reserved for concepts with insufficient information to code with codable children: Secondary | ICD-10-CM

## 2014-04-20 DIAGNOSIS — E1129 Type 2 diabetes mellitus with other diabetic kidney complication: Secondary | ICD-10-CM

## 2014-04-20 DIAGNOSIS — Z13820 Encounter for screening for osteoporosis: Secondary | ICD-10-CM

## 2014-04-20 DIAGNOSIS — E669 Obesity, unspecified: Secondary | ICD-10-CM

## 2014-04-20 DIAGNOSIS — I10 Essential (primary) hypertension: Secondary | ICD-10-CM

## 2014-04-20 DIAGNOSIS — E559 Vitamin D deficiency, unspecified: Secondary | ICD-10-CM

## 2014-04-20 DIAGNOSIS — E1121 Type 2 diabetes mellitus with diabetic nephropathy: Secondary | ICD-10-CM

## 2014-04-20 DIAGNOSIS — E1165 Type 2 diabetes mellitus with hyperglycemia: Principal | ICD-10-CM

## 2014-04-20 DIAGNOSIS — K219 Gastro-esophageal reflux disease without esophagitis: Secondary | ICD-10-CM

## 2014-04-20 DIAGNOSIS — F329 Major depressive disorder, single episode, unspecified: Secondary | ICD-10-CM

## 2014-04-20 DIAGNOSIS — E785 Hyperlipidemia, unspecified: Secondary | ICD-10-CM

## 2014-04-20 DIAGNOSIS — G4700 Insomnia, unspecified: Secondary | ICD-10-CM

## 2014-04-20 DIAGNOSIS — M549 Dorsalgia, unspecified: Secondary | ICD-10-CM

## 2014-04-20 LAB — TSH: TSH: 2.044 u[IU]/mL (ref 0.350–4.500)

## 2014-04-20 MED ORDER — CEPHALEXIN 500 MG PO CAPS
500.0000 mg | ORAL_CAPSULE | Freq: Four times a day (QID) | ORAL | Status: DC
Start: 2014-04-20 — End: 2014-08-26

## 2014-04-20 MED ORDER — ERGOCALCIFEROL 1.25 MG (50000 UT) PO CAPS: 50000.0000 [IU] | ORAL_CAPSULE | ORAL | Status: DC

## 2014-04-20 NOTE — Patient Instructions (Addendum)
Annual exam, pelvic and breast in 4 month, call if you need me before  Please reduce the amount of fatty foods in your diet.  Please commit to eating regularly so blood sugar does not fall too low  New for low vit D is once weekly capsule take for at least 6 months  Antibiotic is sent to local pharmacy for swelling and redness of right leg where your puppy traumatized you   You are referred for bone density test  Thankful vision is better   Start dAILY calcium with D 1276m/1000IU for bones please  hBA1C chem 7 and EGFR in 4 monht, no fasting

## 2014-04-21 ENCOUNTER — Telehealth: Payer: Self-pay | Admitting: Family Medicine

## 2014-04-21 MED ORDER — POLYETHYLENE GLYCOL 3350 17 G PO PACK: 17.0000 g | PACK | Freq: Every day | ORAL | Status: DC

## 2014-04-21 NOTE — Telephone Encounter (Signed)
Refill sent.

## 2014-04-26 ENCOUNTER — Ambulatory Visit (HOSPITAL_COMMUNITY)
Admission: RE | Admit: 2014-04-26 | Discharge: 2014-04-26 | Disposition: A | Discharge status: Home / Self Care | Payer: Medicare Other | Source: Ambulatory Visit | Attending: Family Medicine | Admitting: Family Medicine

## 2014-04-26 ENCOUNTER — Encounter: Payer: Self-pay | Admitting: Family Medicine

## 2014-04-26 DIAGNOSIS — Z13820 Encounter for screening for osteoporosis: Secondary | ICD-10-CM | POA: Insufficient documentation

## 2014-04-26 DIAGNOSIS — M8580 Other specified disorders of bone density and structure, unspecified site: Secondary | ICD-10-CM | POA: Insufficient documentation

## 2014-04-26 LAB — GLUCOSE, POCT (MANUAL RESULT ENTRY): POC GLUCOSE: 49 mg/dL — AB (ref 70–99)

## 2014-04-27 ENCOUNTER — Telehealth: Payer: Self-pay | Admitting: Family Medicine

## 2014-04-27 ENCOUNTER — Other Ambulatory Visit: Payer: Self-pay

## 2014-04-27 MED ORDER — HYDROCODONE-ACETAMINOPHEN 10-325 MG PO TABS: ORAL_TABLET | ORAL | Status: DC

## 2014-04-28 ENCOUNTER — Encounter (INDEPENDENT_AMBULATORY_CARE_PROVIDER_SITE_OTHER): Payer: Medicare Other | Admitting: Ophthalmology

## 2014-04-28 ENCOUNTER — Telehealth: Payer: Self-pay | Admitting: Family Medicine

## 2014-04-28 DIAGNOSIS — H35349 Macular cyst, hole, or pseudohole, unspecified eye: Secondary | ICD-10-CM

## 2014-04-28 DIAGNOSIS — E11319 Type 2 diabetes mellitus with unspecified diabetic retinopathy without macular edema: Secondary | ICD-10-CM

## 2014-04-28 DIAGNOSIS — E1139 Type 2 diabetes mellitus with other diabetic ophthalmic complication: Secondary | ICD-10-CM

## 2014-04-28 DIAGNOSIS — H43819 Vitreous degeneration, unspecified eye: Secondary | ICD-10-CM

## 2014-04-28 DIAGNOSIS — E1165 Type 2 diabetes mellitus with hyperglycemia: Secondary | ICD-10-CM

## 2014-04-28 DIAGNOSIS — I10 Essential (primary) hypertension: Secondary | ICD-10-CM

## 2014-04-28 DIAGNOSIS — H35039 Hypertensive retinopathy, unspecified eye: Secondary | ICD-10-CM

## 2014-04-28 DIAGNOSIS — H33309 Unspecified retinal break, unspecified eye: Secondary | ICD-10-CM

## 2014-04-28 NOTE — Telephone Encounter (Signed)
Spoke with patient who states that yesterday her leg was swollen and warm to touch under the area of injury from her dog.  She is currently taking ABT prescribed by this office.  She states that she also put some eye drops that contained steroids on the area.  She states that it is now resolving.

## 2014-04-28 NOTE — Telephone Encounter (Signed)
Spoke with patient.

## 2014-04-28 NOTE — Telephone Encounter (Signed)
Attempted to reach patient multiple times.  No ability to leave message.   Called and left message for patient's daughter to relay the message.

## 2014-05-04 ENCOUNTER — Other Ambulatory Visit: Payer: Self-pay | Admitting: Family Medicine

## 2014-05-12 ENCOUNTER — Ambulatory Visit (INDEPENDENT_AMBULATORY_CARE_PROVIDER_SITE_OTHER): Payer: Medicare Other | Admitting: Ophthalmology

## 2014-05-12 DIAGNOSIS — H33309 Unspecified retinal break, unspecified eye: Secondary | ICD-10-CM

## 2014-05-19 ENCOUNTER — Other Ambulatory Visit: Payer: Self-pay

## 2014-05-19 MED ORDER — HYDROCODONE-ACETAMINOPHEN 10-325 MG PO TABS: ORAL_TABLET | ORAL | Status: DC

## 2014-05-24 ENCOUNTER — Encounter: Payer: Self-pay | Admitting: Family Medicine

## 2014-05-24 DIAGNOSIS — E559 Vitamin D deficiency, unspecified: Secondary | ICD-10-CM | POA: Insufficient documentation

## 2014-05-24 NOTE — Assessment & Plan Note (Signed)
Elevated LDL No med change Hyperlipidemia:Low fat diet discussed and encouraged.

## 2014-05-24 NOTE — Assessment & Plan Note (Signed)
Controlled, no change in medication Having a dog has been benficial

## 2014-05-24 NOTE — Assessment & Plan Note (Signed)
Sleep hygiene reviewed and written information offered also. Prescription sent for  medication needed.  

## 2014-05-24 NOTE — Assessment & Plan Note (Signed)
Weekly vit d prescribed, pt also has osteopenia

## 2014-05-24 NOTE — Assessment & Plan Note (Signed)
Slightly improved, pt congratulated on this No med change Patient advised to reduce carb and sweets, commit to regular physical activity, take meds as prescribed, test blood as directed, and attempt to lose weight, to improve blood sugar control. Updated lab needed at/ before next visit.

## 2014-05-24 NOTE — Assessment & Plan Note (Signed)
Controlled, no change in medication  

## 2014-05-24 NOTE — Assessment & Plan Note (Signed)
Improved. Pt applauded on succesful weight loss through lifestyle change, and encouraged to continue same. Weight loss goal set for the next several months.  

## 2014-05-24 NOTE — Assessment & Plan Note (Signed)
Uncontrolled, may need to increase to amlodipine 10mg  at next visit if SBP is still at 140. DASH diet and commitment to daily physical activity for a minimum of 30 minutes discussed and encouraged, as a part of hypertension management. The importance of attaining a healthy weight is also discussed.

## 2014-05-24 NOTE — Assessment & Plan Note (Signed)
Followed by urology , and better response to current med

## 2014-05-24 NOTE — Assessment & Plan Note (Signed)
Unchanged, chronic medication as before

## 2014-05-24 NOTE — Progress Notes (Signed)
Subjective:    Patient ID: [name], female    DOB: 1941/12/19, 73 y.o.   MRN: [id number]  HPI The PT is here for follow up and re-evaluation of chronic medical conditions, medication management and review of any available recent lab and radiology data.  Preventive health is updated, specifically  Cancer screening and Immunization.   Questions or concerns regarding consultations or procedures which the PT has had in the interim are  Addressed.Has had successful eye surgery since last visit, also being followed by urology for incontinence and reports improved symptom control The PT denies any adverse reactions to current medications since the last visit.  Recently was scratched by her puppy and has residual pain and redness over the area about which she is concerned Reports improved BG levels and denies polyuria, polydipsia or hypoglycemic episodes     Review of Systems See HPI Denies recent fever or chills. Denies sinus pressure, nasal congestion, ear pain or sore throat. Denies chest congestion, productive cough or wheezing. Denies chest pains, palpitations and leg swelling Denies abdominal pain, nausea, vomiting,diarrhea or constipation.   Denies dysuria, frequency, hesitancy . Chronic back pain and limitation in mobility, unchanged Denies headaches, seizures, numbness, or tingling. Denies uncontrolled  depression, anxiety or insomnia.        Objective:   Physical Exam BP 144/72  Pulse 66  Resp 18  Ht 5\' 6"  (1.676 m)  Wt 235 lb 1.9 oz (106.65 kg)  BMI 37.97 kg/m2  SpO2 90% Patient alert and oriented and in no cardiopulmonary distress.  HEENT: No facial asymmetry, EOMI, no sinus tenderness,  oropharynx pink and moist.  Neck supple no adenopathy.  Chest: Clear to auscultation bilaterally.  CVS: S1, S2 no murmurs, no S3.  ABD: Soft non tender. Bowel sounds normal.  Ext: No edema  MS: decreased  ROM spine, shoulders, hips and knees.  Skin: erythema and  scratches present on  Right lower leg in area of recent trauma, no purulent drainage noted Psych: Good eye contact, normal affect. Memory intact not anxious or depressed appearing.  CNS: CN 2-12 intact, power, tone and sensation normal throughout.        Assessment & Plan:  Essential hypertension, benign Uncontrolled, may need to increase to amlodipine 10mg  at next visit if SBP is still at 140. DASH diet and commitment to daily physical activity for a minimum of 30 minutes discussed and encouraged, as a part of hypertension management. The importance of attaining a healthy weight is also discussed.   HYPERLIPIDEMIA Elevated LDL No med change Hyperlipidemia:Low fat diet discussed and encouraged.    OBESITY, UNSPECIFIED Improved. Pt applauded on succesful weight loss through lifestyle change, and encouraged to continue same. Weight loss goal set for the next several months.   DEPRESSION Controlled, no change in medication Having a dog has been benficial  UNSPECIFIED URINARY INCONTINENCE Followed by urology , and better response to current med  Insomnia Sleep hygiene reviewed and written information offered also. Prescription sent for  medication needed.   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 Updated lab needed at/ before  next visit.

## 2014-05-27 ENCOUNTER — Other Ambulatory Visit: Payer: Self-pay | Admitting: Family Medicine

## 2014-06-16 ENCOUNTER — Other Ambulatory Visit: Payer: Self-pay | Admitting: Family Medicine

## 2014-06-22 ENCOUNTER — Other Ambulatory Visit: Payer: Self-pay | Admitting: Family Medicine

## 2014-06-29 ENCOUNTER — Other Ambulatory Visit: Payer: Self-pay

## 2014-06-29 MED ORDER — HYDROCODONE-ACETAMINOPHEN 10-325 MG PO TABS: ORAL_TABLET | ORAL | Status: DC

## 2014-07-22 ENCOUNTER — Other Ambulatory Visit: Payer: Self-pay

## 2014-07-22 MED ORDER — HYDROCODONE-ACETAMINOPHEN 10-325 MG PO TABS: ORAL_TABLET | ORAL | Status: DC

## 2014-07-27 ENCOUNTER — Other Ambulatory Visit: Payer: Self-pay | Admitting: Family Medicine

## 2014-07-28 ENCOUNTER — Other Ambulatory Visit: Payer: Self-pay | Admitting: Family Medicine

## 2014-07-30 ENCOUNTER — Other Ambulatory Visit: Payer: Self-pay | Admitting: Family Medicine

## 2014-08-02 ENCOUNTER — Other Ambulatory Visit: Payer: Self-pay

## 2014-08-02 MED ORDER — ONDANSETRON HCL 4 MG PO TABS: ORAL_TABLET | ORAL | Status: DC

## 2014-08-16 ENCOUNTER — Telehealth: Payer: Self-pay

## 2014-08-16 NOTE — Telephone Encounter (Signed)
PT's last colonoscopy was 04/2012 and next recommended in 10 years. She is on recall for 04/2022. She is not having rectal bleeding. She does have constipation some and that is due to some of her meds she says. She thinks she needs an EGD due to some difficulty swallowing. Scheduled her an OV with Neil Crouch, PA on 09/15/2014 at 11:00 AM.

## 2014-08-16 NOTE — Telephone Encounter (Signed)
Patient received a letter from [name] asking her to call, please return her call

## 2014-08-24 ENCOUNTER — Other Ambulatory Visit: Payer: Self-pay | Admitting: Family Medicine

## 2014-08-26 ENCOUNTER — Encounter (INDEPENDENT_AMBULATORY_CARE_PROVIDER_SITE_OTHER): Payer: Self-pay

## 2014-08-26 ENCOUNTER — Other Ambulatory Visit: Payer: Self-pay

## 2014-08-26 ENCOUNTER — Other Ambulatory Visit (HOSPITAL_COMMUNITY)
Admission: RE | Admit: 2014-08-26 | Discharge: 2014-08-26 | Disposition: A | Discharge status: Home / Self Care | Payer: Medicare Other | Source: Ambulatory Visit | Attending: Family Medicine | Admitting: Family Medicine

## 2014-08-26 ENCOUNTER — Ambulatory Visit (INDEPENDENT_AMBULATORY_CARE_PROVIDER_SITE_OTHER): Payer: Medicare Other | Admitting: Family Medicine

## 2014-08-26 ENCOUNTER — Encounter: Payer: Self-pay | Admitting: Family Medicine

## 2014-08-26 VITALS — BP 162/64 | HR 60 | Resp 18 | Ht 66.0 in | Wt 235.1 lb

## 2014-08-26 DIAGNOSIS — IMO0002 Reserved for concepts with insufficient information to code with codable children: Secondary | ICD-10-CM

## 2014-08-26 DIAGNOSIS — E039 Hypothyroidism, unspecified: Secondary | ICD-10-CM

## 2014-08-26 DIAGNOSIS — E1121 Type 2 diabetes mellitus with diabetic nephropathy: Secondary | ICD-10-CM

## 2014-08-26 DIAGNOSIS — E785 Hyperlipidemia, unspecified: Secondary | ICD-10-CM

## 2014-08-26 DIAGNOSIS — Z23 Encounter for immunization: Secondary | ICD-10-CM

## 2014-08-26 DIAGNOSIS — Z124 Encounter for screening for malignant neoplasm of cervix: Secondary | ICD-10-CM

## 2014-08-26 DIAGNOSIS — E1165 Type 2 diabetes mellitus with hyperglycemia: Secondary | ICD-10-CM

## 2014-08-26 DIAGNOSIS — R87629 Unspecified abnormal cytological findings in specimens from vagina: Secondary | ICD-10-CM

## 2014-08-26 DIAGNOSIS — Z1211 Encounter for screening for malignant neoplasm of colon: Secondary | ICD-10-CM

## 2014-08-26 DIAGNOSIS — Z0000 Encounter for general adult medical examination without abnormal findings: Secondary | ICD-10-CM

## 2014-08-26 DIAGNOSIS — I10 Essential (primary) hypertension: Secondary | ICD-10-CM

## 2014-08-26 LAB — HEMOGLOBIN A1C
HEMOGLOBIN A1C: 7.5 % — AB (ref <5.7)
MEAN PLASMA GLUCOSE: 169 mg/dL — AB (ref <117)

## 2014-08-26 LAB — POC HEMOCCULT BLD/STL (OFFICE/1-CARD/DIAGNOSTIC): Fecal Occult Blood, POC: NEGATIVE

## 2014-08-26 MED ORDER — HYDROCODONE-ACETAMINOPHEN 10-325 MG PO TABS: ORAL_TABLET | ORAL | Status: DC

## 2014-08-26 MED ORDER — SPIRONOLACTONE 25 MG PO TABS: 25.0000 mg | ORAL_TABLET | Freq: Every day | ORAL | Status: DC

## 2014-08-26 NOTE — Assessment & Plan Note (Signed)
Uncontrolled add spironolactone  DASH diet and commitment to daily physical activity for a minimum of 30 minutes discussed and encouraged, as a part of hypertension management. The importance of attaining a healthy weight is also discussed.  

## 2014-08-26 NOTE — Assessment & Plan Note (Signed)
Annual exam as documented. Counseling done  re healthy lifestyle involving commitment to 150 minutes exercise per week, heart healthy diet, and attaining healthy weight.The importance of adequate sleep also discussed. Regular seat belt use and safe storage  of firearms if patient has them, is also discussed. Changes in health habits are decided on by the patient with goals and time frames  set for achieving them. Immunization and cancer screening needs are specifically addressed at this visit.  

## 2014-08-26 NOTE — Patient Instructions (Signed)
F/u in 3. month, call if you need me before  Flu vaccine today, and microalb from office  Lipid, cmp and EGFR, HBA1C and tSH today  New additional med for blood pressure, spironolactone one daily  Chem 7 non fast in 2 weeks to check potassium level

## 2014-08-27 LAB — COMPLETE METABOLIC PANEL WITH GFR
ALBUMIN: 4.1 g/dL (ref 3.5–5.2)
ALT: 13 U/L (ref 0–35)
AST: 13 U/L (ref 0–37)
Alkaline Phosphatase: 148 U/L — ABNORMAL HIGH (ref 39–117)
BUN: 19 mg/dL (ref 6–23)
CALCIUM: 9.4 mg/dL (ref 8.4–10.5)
CHLORIDE: 105 meq/L (ref 96–112)
CO2: 29 meq/L (ref 19–32)
CREATININE: 0.76 mg/dL (ref 0.50–1.10)
GFR, EST NON AFRICAN AMERICAN: 79 mL/min
GLUCOSE: 77 mg/dL (ref 70–99)
POTASSIUM: 4 meq/L (ref 3.5–5.3)
Sodium: 141 mEq/L (ref 135–145)
Total Bilirubin: 0.5 mg/dL (ref 0.2–1.2)
Total Protein: 6.9 g/dL (ref 6.0–8.3)

## 2014-08-27 LAB — LIPID PANEL
CHOLESTEROL: 166 mg/dL (ref 0–200)
HDL: 53 mg/dL (ref >39)
LDL Cholesterol: 93 mg/dL (ref 0–99)
Total CHOL/HDL Ratio: 3.1 Ratio
Triglycerides: 101 mg/dL (ref <150)
VLDL: 20 mg/dL (ref 0–40)

## 2014-08-27 LAB — MICROALBUMIN / CREATININE URINE RATIO
CREATININE, URINE: 157.4 mg/dL
MICROALB/CREAT RATIO: 167.5 mg/g — AB (ref 0.0–30.0)
Microalb, Ur: 26.36 mg/dL — ABNORMAL HIGH (ref 0.00–1.89)

## 2014-08-27 LAB — CYTOLOGY - PAP

## 2014-08-27 LAB — TSH: TSH: 1.305 u[IU]/mL (ref 0.350–4.500)

## 2014-08-29 DIAGNOSIS — Z23 Encounter for immunization: Secondary | ICD-10-CM | POA: Insufficient documentation

## 2014-08-29 NOTE — Progress Notes (Signed)
   Subjective:    Patient ID: [name], female    DOB: Oct 09, 1941, 73 y.o.   MRN: [id number]  HPI Patient is in for complete physical exam. Blood pressure is elevated and uncontrolled, medication adjustment is made Diabetic foot exam, annual complete is due, and is done Fl vaccine is due and is administered. Intact, no ulceration, erythema , scaling or rash noted.      Review of Systems See HPI     Objective:   Physical Exam  BP 162/64  Pulse 60  Resp 18  Ht 5\' 6"  (1.676 m)  Wt 235 lb 1.9 oz (106.65 kg)  BMI 37.97 kg/m2  SpO2 94% Pleasant obese female, alert and oriented x 3, in no cardio-pulmonary distress. Afebrile. HEENT No facial trauma or asymetry. Sinuses non tender.  EOMI, PERTL,.  External ears normal, tympanic membranes clear. Oropharynx moist, no exudate, fair dentition. Neck: decreased though adequate ROM, no adenopathy,JVD or thyromegaly.No bruits.  Chest: Clear to ascultation bilaterally.No crackles or wheezes. Non tender to palpation  Breast: No asymetry,no masses or lumps. No tenderness. No nipple discharge or inversion. No axillary or supraclavicular adenopathy  Cardiovascular system; Heart sounds normal,  S1 and  S2 ,no S3.  No murmur, or thrill. Apical beat not displaced Peripheral pulses normal.  Abdomen: Soft, non tender, no organomegaly or masses. No bruits. Bowel sounds normal. No guarding, tenderness or rebound.  Rectal:  Normal sphincter tone. No mass.No rectal masses.  Guaiac negative stool.  GU: External genitalia normal female genitalia , female distribution of hair. No lesions. Urethral meatus normal in size, no  Prolapse, no lesions visibly  Present. Bladder non tender. Vagina pink and moist , with no visible lesions , no  discharge present . Adequate pelvic support no  cystocele or rectocele noted  Uterus absent, no adnexal masses, no adnexal tenderness.   Musculoskeletal exam: Decreased though adequate  ROM  of spine, hips , shoulders and knees.  deformity ,swelling and  crepitus noted in knees No muscle wasting or atrophy.   Neurologic: Cranial nerves 2 to 12 intact. Power, tone ,sensation and reflexes normal throughout. No disturbance in gait. No tremor.  Skin: Intact, no ulceration, erythema , scaling or rash noted. Pigmentation normal throughout  Psych; Normal mood and affect. Judgement and concentration normal       Assessment & Plan:  Encounter for annual physical exam Annual exam as documented. Counseling done  re healthy lifestyle involving commitment to 150 minutes exercise per week, heart healthy diet, and attaining healthy weight.The importance of adequate sleep also discussed. Regular seat belt use and safe storage  of firearms if patient has them, is also discussed. Changes in health habits are decided on by the patient with goals and time frames  set for achieving them. Immunization and cancer screening needs are specifically addressed at this visit.   Essential hypertension, benign Uncontrolled add spironolactone DASH diet and commitment to daily physical activity for a minimum of 30 minutes discussed and encouraged, as a part of hypertension management. The importance of attaining a healthy weight is also discussed.   Need for prophylactic vaccination and inoculation against influenza Vaccine administered at visit.

## 2014-08-29 NOTE — Assessment & Plan Note (Signed)
Vaccine administered at visit.  

## 2014-09-02 ENCOUNTER — Other Ambulatory Visit: Payer: Self-pay | Admitting: Family Medicine

## 2014-09-02 DIAGNOSIS — N890 Mild vaginal dysplasia: Secondary | ICD-10-CM

## 2014-09-03 NOTE — Addendum Note (Signed)
Addended by: Denman George B on: 09/03/2014 01:43 PM   Modules accepted: Orders

## 2014-09-14 ENCOUNTER — Ambulatory Visit (INDEPENDENT_AMBULATORY_CARE_PROVIDER_SITE_OTHER): Payer: Medicare Other | Admitting: Ophthalmology

## 2014-09-14 DIAGNOSIS — H348392 Tributary (branch) retinal vein occlusion, unspecified eye, stable: Secondary | ICD-10-CM

## 2014-09-14 DIAGNOSIS — E1165 Type 2 diabetes mellitus with hyperglycemia: Secondary | ICD-10-CM

## 2014-09-14 DIAGNOSIS — I10 Essential (primary) hypertension: Secondary | ICD-10-CM

## 2014-09-14 DIAGNOSIS — E1139 Type 2 diabetes mellitus with other diabetic ophthalmic complication: Secondary | ICD-10-CM

## 2014-09-14 DIAGNOSIS — H35039 Hypertensive retinopathy, unspecified eye: Secondary | ICD-10-CM

## 2014-09-14 DIAGNOSIS — H35349 Macular cyst, hole, or pseudohole, unspecified eye: Secondary | ICD-10-CM

## 2014-09-14 DIAGNOSIS — E11319 Type 2 diabetes mellitus with unspecified diabetic retinopathy without macular edema: Secondary | ICD-10-CM

## 2014-09-14 LAB — HM DIABETES EYE EXAM

## 2014-09-15 ENCOUNTER — Ambulatory Visit (INDEPENDENT_AMBULATORY_CARE_PROVIDER_SITE_OTHER): Payer: Medicare Other | Admitting: Gastroenterology

## 2014-09-15 ENCOUNTER — Other Ambulatory Visit: Payer: Self-pay

## 2014-09-15 ENCOUNTER — Encounter: Payer: Self-pay | Admitting: Gastroenterology

## 2014-09-15 VITALS — BP 156/70 | HR 60 | Temp 97.9°F | Ht 66.0 in | Wt 235.0 lb

## 2014-09-15 DIAGNOSIS — R1310 Dysphagia, unspecified: Secondary | ICD-10-CM

## 2014-09-15 DIAGNOSIS — R1319 Other dysphagia: Secondary | ICD-10-CM

## 2014-09-15 DIAGNOSIS — R1314 Dysphagia, pharyngoesophageal phase: Secondary | ICD-10-CM

## 2014-09-15 NOTE — Progress Notes (Signed)
Primary Care Physician: [name], MD  Primary Gastroenterologist:  Garfield Cornea, MD   Chief Complaint  Patient presents with  . Dysphagia    HPI: [name] is a 73 y.o. female here for further evaluation of dysphagia. She has h/o myasthenia gravis and esophageal rings. Having recurrent dysphagia especially to  meat/breads. Avoids at all times now. Did well after last EGD/ED for about 18 months. C/o epigastric discomfort, ?heartburn. Worse with meals. Vomiting. Several times per week. No spicy foods. BM constipation.  Not sure why. Eats healthy. miralax prn. Every day is too much. No melena, brbpr. myathenia gravis, prednisone. Fatigue every day.    Current Outpatient Prescriptions  Medication Sig Dispense Refill  . albuterol (PROAIR HFA) 108 (90 BASE) MCG/ACT inhaler Inhale 2 puffs into the lungs 2 (two) times daily as needed for wheezing or shortness of breath.      Marland Kitchen amLODipine (NORVASC) 5 MG tablet TAKE ONE TABLET BY MOUTH ONCE DAILY.  30 tablet  2  . bacitracin-polymyxin b (POLYSPORIN) ophthalmic ointment Place 1 application into the left eye 4 (four) times daily. apply to eye every 12 hours while awake  3.5 g  0  . brimonidine-timolol (COMBIGAN) 0.2-0.5 % ophthalmic solution Place 1 drop into both eyes every 12 (twelve) hours.      . cetirizine (ZYRTEC) 10 MG tablet TAKE ONE TABLET BY MOUTH DAILY.  30 tablet  2  . citalopram (CELEXA) 40 MG tablet TAKE ONE TABLET BY MOUTH DAILY.  30 tablet  2  . clonazePAM (KLONOPIN) 0.5 MG tablet TAKE ONE TABLET BY MOUTH 3 TIMES DAILY AS NEEDED FOR ANXIETY.  90 tablet  2  . Cranberry 1000 MG CAPS Take 1,000 mg by mouth daily.       . CRESTOR 20 MG tablet TAKE (1) TABLET BY MOUTH AT BEDTIME.  30 tablet  2  . cycloSPORINE (RESTASIS) 0.05 % ophthalmic emulsion Place 1 drop into both eyes 2 (two) times daily.       Marland Kitchen diltiazem (CARDIZEM CD) 240 MG 24 hr capsule TAKE (1) CAPSULE BY MOUTH TWICE DAILY.  60 capsule  2  . Flaxseed,  Linseed, (FLAXSEED OIL) 1000 MG CAPS Take 1,000 mg by mouth daily.      . fluticasone (FLONASE) 50 MCG/ACT nasal spray Place 1-2 sprays into both nostrils daily as needed for allergies or rhinitis.      Marland Kitchen gabapentin (NEURONTIN) 300 MG capsule Take 1 capsule (300 mg total) by mouth 3 (three) times daily.  90 capsule  5  . glipiZIDE-metformin (METAGLIP) 2.5-500 MG per tablet TAKE 2 TABLETS BY MOUTH TWICE A DAY BEFORE MEALS.  120 tablet  2  . hydrochlorothiazide (HYDRODIURIL) 25 MG tablet Take 1 tablet (25 mg total) by mouth daily.  30 tablet  11  . HYDROcodone-acetaminophen (NORCO) 10-325 MG per tablet TAKE 1 TABLET BY MOUTH 3 TIMES DAILY.  90 tablet  0  . LANTUS SOLOSTAR 100 UNIT/ML Solostar Pen INJECT 30 UNITS SUBCUTANEOUSLY ONCE DAILY.  15 mL  2  . lisinopril (PRINIVIL,ZESTRIL) 40 MG tablet Take 1 tablet (40 mg total) by mouth daily.  30 tablet  11  . losartan (COZAAR) 100 MG tablet TAKE (1) TABLET BY MOUTH DAILY.  30 tablet  2  . mirabegron ER (MYRBETRIQ) 25 MG TB24 tablet Take 25 mg by mouth daily.      . Multiple Vitamin (MULTIVITAMIN WITH MINERALS) TABS tablet Take 1 tablet by mouth at bedtime.      Marland Kitchen  omeprazole (PRILOSEC) 20 MG capsule TAKE ONE CAPSULE BY MOUTH DAILY.  30 capsule  2  . ondansetron (ZOFRAN) 4 MG tablet TAKE (1) TABLET BY MOUTH EVERY FOUR HOURS AS NEEDED FOR NAUSEA.  30 tablet  0  . oxybutynin (DITROPAN-XL) 10 MG 24 hr tablet TAKE 1 TABLET BY MOUTH DAILY.  30 tablet  2  . polyethylene glycol (MIRALAX / GLYCOLAX) packet Take 17 g by mouth at bedtime. Mix with 8 oz of liquid and drink  30 each  3  . prednisoLONE acetate (PRED FORTE) 1 % ophthalmic suspension Place 1 drop into the left eye 4 (four) times daily.  5 mL  0  . predniSONE (DELTASONE) 5 MG tablet TAKE 1 TABLET BY MOUTH DAILY.  30 tablet  2  . promethazine (PHENERGAN) 25 MG tablet Take 25 mg by mouth daily as needed for nausea.      Marland Kitchen pyridostigmine (MESTINON) 60 MG tablet TAKE (1) TABLET BY MOUTH THREE TIMES A DAY.  90  tablet  2  . spironolactone (ALDACTONE) 25 MG tablet Take 1 tablet (25 mg total) by mouth daily.  30 tablet  5  . temazepam (RESTORIL) 30 MG capsule Take 30 mg by mouth at bedtime as needed for sleep.      Marland Kitchen venlafaxine XR (EFFEXOR-XR) 150 MG 24 hr capsule TAKE 1 CAPSULE BY MOUTH DAILY.  30 capsule  2  . Vitamin D, Ergocalciferol, (DRISDOL) 50000 UNITS CAPS capsule TAKE 1 CAPSULE BY MOUTH ONCE A WEEK.  4 capsule  2  . ACCU-CHEK AVIVA PLUS test strip USE TO TEST BLOOD SUGAR 3 TIMES A DAY.  100 each  6  . B-D ULTRAFINE III SHORT PEN 31G X 8 MM MISC USE ONCE DAILY WITH LANTUS SOLOSTAR PEN.  100 each  2  . diltiazem (DILACOR XR) 240 MG 24 hr capsule Take 240 mg by mouth 2 (two) times daily.      . [DISCONTINUED] DITROPAN XL 10 MG 24 hr tablet TAKE 1 TABLET BY MOUTH DAILY.  30 each  3   No current facility-administered medications for this visit.    Allergies as of 09/15/2014  . (No Known Allergies)   Past Medical History  Diagnosis Date  . Hypertension   . Tobacco abuse   . Myasthenia gravis   . Gastroesophageal reflux   . Thyroid disease     "used to take RX; they took me off it" (01/19/2014)  . Iron deficiency anemia   . Depression   . Hyperlipidemia   . Obesity   . Carpal tunnel syndrome   . Shoulder pain   . Impingement syndrome, shoulder   . Pulmonary HTN   . Mitral regurgitation   . Schatzki's ring     Last EGD w/ dilation 02/08/11, 2009 & 2007  . Esophagitis, erosive 2009  . Diverticulosis 07/2004    Colonscopy Dr Gala Romney  . Heart murmur     saw cardiology In [place], he told her she did not need to come back.  . Seasonal allergies   . Chronic bronchitis     "got it q yr for awhile; hasn't had it in awhile" (01/19/2014)  . Exertional shortness of breath   . OSA on CPAP   . Type II diabetes mellitus   . JJOACZYS(063.0)     "usually a couple times/wk" (01/19/2014)  . Arthritis     "all over" (01/19/2014)  . Anxiety   . Pneumonia 01/2012   Past Surgical History    Procedure Laterality  Date  . Total knee arthroplasty Right 05/13/07    Dr. Aline Brochure  . Carpal tunnel release Bilateral   . Incontinence surgery  08/26/09    [facility]  . Cholecystectomy    . Abdominal hysterectomy    . Eye surgery    . Esophagogastroduodenoscopy  02/08/11    Rourk-Distal esophageal erosion consistent with mild erosive reflux   esophagitis/ Noncritical Schatzki ring, small hiatal hernia otherwise upper/ gastrointestinal tract appeared unremarkable, status post passage  of a Maloney dilation to biopsy disruption of the ring described  . Colonoscopy  05/08/2012    CZY:SAYTKZSW and external hemorrhoids; colonic diverticulosis  . Joint replacement      right total knee  . Appendectomy    . Tonsillectomy    . Cataract extraction w/phaco Left 10/21/2013    Procedure: LEFT CATARACT EXTRACTION PHACO AND INTRAOCULAR LENS PLACEMENT (IOC);  Surgeon: Marylynn Pearson, MD;  Location: [facility];  Service: Ophthalmology;  Laterality: Left;  . 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 Esophageal dilation      "more than 3 times" (01/19/2014)  . 25 gauge pars plana vitrectomy with 20 gauge mvr port Left 01/19/2014    Procedure: 25 GAUGE PARS PLANA VITRECTOMY WITH 20 GAUGE MVR PORT; MEMBRAME PEEL; SERUM PATCH; LASER TREATMENT; C3F8;  Surgeon: Hayden Pedro, MD;  Location: [facility];  Service: Ophthalmology;  Laterality: Left;  . Esophagogastroduodenoscopy  04/2012    2 tandem incomplete distal esophagea rings s/p dilation.   . Esophagogastroduodenoscopy N/A 09/16/2014    Procedure: ESOPHAGOGASTRODUODENOSCOPY (EGD);  Surgeon: Daneil Dolin, MD;  [facility];  Service: Endoscopy;  Laterality: N/A;  1200  . Savory dilation N/A 09/16/2014    Procedure: SAVORY DILATION;  Surgeon: Daneil Dolin, MD;  [facility];  Service: Endoscopy;  Laterality: N/A;  Venia Minks dilation N/A 09/16/2014    Procedure: Venia Minks DILATION;  Surgeon: Daneil Dolin, MD;  [facility]: [facility];  Service: Endoscopy;  Laterality: N/A;    ROS:  General: Negative for anorexia, weight loss, fever, chills, fatigue, weakness. ENT: Negative for hoarseness,  nasal congestion. CV: Negative for chest pain, angina, palpitations, dyspnea on exertion, peripheral edema.  Respiratory: Negative for dyspnea at rest, dyspnea on exertion, cough, sputum, wheezing.  GI: See history of present illness. GU:  Negative for dysuria, hematuria, urinary incontinence, urinary frequency, nocturnal urination.  Endo: Negative for unusual weight change.    Physical Examination:   BP 156/70  Pulse 60  Temp(Src) 97.9 F (36.6 C) (Oral)  Ht 5\' 6"  (1.676 m)  Wt 235 lb (106.595 kg)  BMI 37.95 kg/m2  General: Well-nourished, well-developed in no acute distress.  Eyes: No icterus. Mouth: Oropharyngeal mucosa moist and pink , no lesions erythema or exudate. Lungs: Clear to auscultation bilaterally.  Heart: Regular rate and rhythm, no murmurs rubs or gallops.  Abdomen: Bowel sounds are normal, nontender, nondistended, no hepatosplenomegaly or masses, no abdominal bruits or hernia , no rebound or guarding.   Extremities: No lower extremity edema. No clubbing or deformities. Neuro: Alert and oriented x 4   Skin: Warm and dry, no jaundice.   Psych: Alert and cooperative, normal mood and affect.  Labs:  Lab Results  Component Value Date   TSH 1.305 08/26/2014   Lab Results  Component Value Date   CREATININE 0.76 08/26/2014   BUN 19 08/26/2014   NA 141 08/26/2014   K 4.0 08/26/2014   CL 105 08/26/2014  CO2 29 08/26/2014   Lab Results  Component Value Date   WBC 7.7 01/19/2014   HGB 9.5* 01/19/2014   HCT 28.0* 01/19/2014   MCV 85.5 01/19/2014   PLT 325 01/19/2014   Lab Results  Component Value Date   ALT 13 08/26/2014   AST 13 08/26/2014   ALKPHOS 148* 08/26/2014   BILITOT 0.5 08/26/2014    Imaging Studies: No results found.

## 2014-09-15 NOTE — Patient Instructions (Signed)
1. Upper endoscopy with Dr. Gala Romney. See separate instructions. 2. Avoid hard vegetables, meats, bread until your procedure. 3. Take miralax every other day for constipation.

## 2014-09-16 ENCOUNTER — Ambulatory Visit (HOSPITAL_COMMUNITY)
Admission: RE | Admit: 2014-09-16 | Discharge: 2014-09-16 | Disposition: A | Discharge status: Home / Self Care | Payer: Medicare Other | Source: Ambulatory Visit | Attending: Internal Medicine | Admitting: Internal Medicine

## 2014-09-16 ENCOUNTER — Encounter (HOSPITAL_COMMUNITY): Payer: Self-pay | Admitting: *Deleted

## 2014-09-16 ENCOUNTER — Encounter (HOSPITAL_COMMUNITY)
Admission: RE | Disposition: A | Discharge status: Home / Self Care | Payer: Self-pay | Source: Ambulatory Visit | Attending: Internal Medicine

## 2014-09-16 DIAGNOSIS — K222 Esophageal obstruction: Secondary | ICD-10-CM | POA: Diagnosis not present

## 2014-09-16 DIAGNOSIS — K449 Diaphragmatic hernia without obstruction or gangrene: Secondary | ICD-10-CM | POA: Insufficient documentation

## 2014-09-16 DIAGNOSIS — Z794 Long term (current) use of insulin: Secondary | ICD-10-CM | POA: Insufficient documentation

## 2014-09-16 DIAGNOSIS — F17200 Nicotine dependence, unspecified, uncomplicated: Secondary | ICD-10-CM | POA: Diagnosis not present

## 2014-09-16 DIAGNOSIS — I10 Essential (primary) hypertension: Secondary | ICD-10-CM | POA: Insufficient documentation

## 2014-09-16 DIAGNOSIS — E785 Hyperlipidemia, unspecified: Secondary | ICD-10-CM | POA: Insufficient documentation

## 2014-09-16 DIAGNOSIS — Z79899 Other long term (current) drug therapy: Secondary | ICD-10-CM | POA: Diagnosis not present

## 2014-09-16 DIAGNOSIS — Q391 Atresia of esophagus with tracheo-esophageal fistula: Secondary | ICD-10-CM

## 2014-09-16 DIAGNOSIS — E119 Type 2 diabetes mellitus without complications: Secondary | ICD-10-CM | POA: Insufficient documentation

## 2014-09-16 DIAGNOSIS — D649 Anemia, unspecified: Secondary | ICD-10-CM | POA: Insufficient documentation

## 2014-09-16 DIAGNOSIS — Z6837 Body mass index (BMI) 37.0-37.9, adult: Secondary | ICD-10-CM | POA: Insufficient documentation

## 2014-09-16 DIAGNOSIS — E669 Obesity, unspecified: Secondary | ICD-10-CM | POA: Insufficient documentation

## 2014-09-16 DIAGNOSIS — Q393 Congenital stenosis and stricture of esophagus: Secondary | ICD-10-CM

## 2014-09-16 DIAGNOSIS — F341 Dysthymic disorder: Secondary | ICD-10-CM | POA: Insufficient documentation

## 2014-09-16 DIAGNOSIS — R1310 Dysphagia, unspecified: Secondary | ICD-10-CM | POA: Diagnosis present

## 2014-09-16 DIAGNOSIS — R1319 Other dysphagia: Secondary | ICD-10-CM

## 2014-09-16 HISTORY — PX: ESOPHAGOGASTRODUODENOSCOPY: SHX5428

## 2014-09-16 HISTORY — PX: MALONEY DILATION: SHX5535

## 2014-09-16 HISTORY — PX: SAVORY DILATION: SHX5439

## 2014-09-16 LAB — GLUCOSE, CAPILLARY: GLUCOSE-CAPILLARY: 91 mg/dL (ref 70–99)

## 2014-09-16 SURGERY — EGD (ESOPHAGOGASTRODUODENOSCOPY)
Anesthesia: Moderate Sedation

## 2014-09-16 MED ORDER — LIDOCAINE VISCOUS 2 % MT SOLN
OROMUCOSAL | Status: AC
  Filled 2014-09-16: qty 15

## 2014-09-16 MED ORDER — MIDAZOLAM HCL 5 MG/5ML IJ SOLN
INTRAMUSCULAR | Status: DC | PRN
  Administered 2014-09-16: 2 mg via INTRAVENOUS
  Administered 2014-09-16: 1 mg via INTRAVENOUS

## 2014-09-16 MED ORDER — ONDANSETRON HCL 4 MG/2ML IJ SOLN
INTRAMUSCULAR | Status: DC | PRN
  Administered 2014-09-16: 4 mg via INTRAVENOUS

## 2014-09-16 MED ORDER — MIDAZOLAM HCL 5 MG/5ML IJ SOLN
INTRAMUSCULAR | Status: AC
  Filled 2014-09-16: qty 10

## 2014-09-16 MED ORDER — MEPERIDINE HCL 100 MG/ML IJ SOLN
INTRAMUSCULAR | Status: AC
  Filled 2014-09-16: qty 2

## 2014-09-16 MED ORDER — SODIUM CHLORIDE 0.9 % IV SOLN
INTRAVENOUS | Status: DC
  Administered 2014-09-16: 12:00:00 via INTRAVENOUS

## 2014-09-16 MED ORDER — ONDANSETRON HCL 4 MG/2ML IJ SOLN
INTRAMUSCULAR | Status: AC
  Filled 2014-09-16: qty 2

## 2014-09-16 MED ORDER — STERILE WATER FOR IRRIGATION IR SOLN
Status: DC | PRN
  Administered 2014-09-16: 13:00:00

## 2014-09-16 MED ORDER — LIDOCAINE VISCOUS 2 % MT SOLN
OROMUCOSAL | Status: DC | PRN
  Administered 2014-09-16: 1 via OROMUCOSAL

## 2014-09-16 MED ORDER — MEPERIDINE HCL 100 MG/ML IJ SOLN
INTRAMUSCULAR | Status: DC | PRN
  Administered 2014-09-16: 25 mg via INTRAVENOUS
  Administered 2014-09-16: 50 mg via INTRAVENOUS

## 2014-09-16 NOTE — H&P (View-Only) (Signed)
   Subjective:    Patient ID: [name], female    DOB: [birth year], 73 y.o.   MRN: [id number]  HPI Patient is in for complete physical exam. Blood pressure is elevated and uncontrolled, medication adjustment is made Diabetic foot exam, annual complete is due, and is done Fl vaccine is due and is administered. Intact, no ulceration, erythema , scaling or rash noted.      Review of Systems See HPI     Objective:   Physical Exam  BP 162/64  Pulse 60  Resp 18  Ht 5\' 6"  (1.676 m)  Wt 235 lb 1.9 oz (106.65 kg)  BMI 37.97 kg/m2  SpO2 94% Pleasant obese female, alert and oriented x 3, in no cardio-pulmonary distress. Afebrile. HEENT No facial trauma or asymetry. Sinuses non tender.  EOMI, PERTL,.  External ears normal, tympanic membranes clear. Oropharynx moist, no exudate, fair dentition. Neck: decreased though adequate ROM, no adenopathy,JVD or thyromegaly.No bruits.  Chest: Clear to ascultation bilaterally.No crackles or wheezes. Non tender to palpation  Breast: No asymetry,no masses or lumps. No tenderness. No nipple discharge or inversion. No axillary or supraclavicular adenopathy  Cardiovascular system; Heart sounds normal,  S1 and  S2 ,no S3.  No murmur, or thrill. Apical beat not displaced Peripheral pulses normal.  Abdomen: Soft, non tender, no organomegaly or masses. No bruits. Bowel sounds normal. No guarding, tenderness or rebound.  Rectal:  Normal sphincter tone. No mass.No rectal masses.  Guaiac negative stool.  GU: External genitalia normal female genitalia , female distribution of hair. No lesions. Urethral meatus normal in size, no  Prolapse, no lesions visibly  Present. Bladder non tender. Vagina pink and moist , with no visible lesions , no  discharge present . Adequate pelvic support no  cystocele or rectocele noted  Uterus absent, no adnexal masses, no adnexal tenderness.   Musculoskeletal exam: Decreased though adequate  ROM  of spine, hips , shoulders and knees.  deformity ,swelling and  crepitus noted in knees No muscle wasting or atrophy.   Neurologic: Cranial nerves 2 to 12 intact. Power, tone ,sensation and reflexes normal throughout. No disturbance in gait. No tremor.  Skin: Intact, no ulceration, erythema , scaling or rash noted. Pigmentation normal throughout  Psych; Normal mood and affect. Judgement and concentration normal       Assessment & Plan:  Encounter for annual physical exam Annual exam as documented. Counseling done  re healthy lifestyle involving commitment to 150 minutes exercise per week, heart healthy diet, and attaining healthy weight.The importance of adequate sleep also discussed. Regular seat belt use and safe storage  of firearms if patient has them, is also discussed. Changes in health habits are decided on by the patient with goals and time frames  set for achieving them. Immunization and cancer screening needs are specifically addressed at this visit.   Essential hypertension, benign Uncontrolled add spironolactone DASH diet and commitment to daily physical activity for a minimum of 30 minutes discussed and encouraged, as a part of hypertension management. The importance of attaining a healthy weight is also discussed.   Need for prophylactic vaccination and inoculation against influenza Vaccine administered at visit.

## 2014-09-16 NOTE — Discharge Instructions (Addendum)
EGD Discharge instructions Please read the instructions outlined below and refer to this sheet in the next few weeks. These discharge instructions provide you with general information on caring for yourself after you leave the hospital. Your doctor may also give you specific instructions. While your treatment has been planned according to the most current medical practices available, unavoidable complications occasionally occur. If you have any problems or questions after discharge, please call your doctor. ACTIVITY  You may resume your regular activity but move at a slower pace for the next 24 hours.   Take frequent rest periods for the next 24 hours.   Walking will help expel (get rid of) the air and reduce the bloated feeling in your abdomen.   No driving for 24 hours (because of the anesthesia (medicine) used during the test).   You may shower.   Do not sign any important legal documents or operate any machinery for 24 hours (because of the anesthesia used during the test).  NUTRITION  Drink plenty of fluids.   You may resume your normal diet.   Begin with a light meal and progress to your normal diet.   Avoid alcoholic beverages for 24 hours or as instructed by your caregiver.  MEDICATIONS  You may resume your normal medications unless your caregiver tells you otherwise.  WHAT YOU CAN EXPECT TODAY  You may experience abdominal discomfort such as a feeling of fullness or gas pains.  FOLLOW-UP  Your doctor will discuss the results of your test with you.  SEEK IMMEDIATE MEDICAL ATTENTION IF ANY OF THE FOLLOWING OCCUR:  Excessive nausea (feeling sick to your stomach) and/or vomiting.   Severe abdominal pain and distention (swelling).   Trouble swallowing.   Temperature over 101 F (37.8 C).   Rectal bleeding or vomiting of blood.     GERD information provided  Continue Prilosec 20 mg orally daily  Followup with [place] as needed in the future Gastroesophageal  Reflux Disease, Adult Gastroesophageal reflux disease (GERD) happens when acid from your stomach flows up into the esophagus. When acid comes in contact with the esophagus, the acid causes soreness (inflammation) in the esophagus. Over time, GERD may create small holes (ulcers) in the lining of the esophagus. CAUSES   Increased body weight. This puts pressure on the stomach, making acid rise from the stomach into the esophagus.  Smoking. This increases acid production in the stomach.  Drinking alcohol. This causes decreased pressure in the lower esophageal sphincter (valve or ring of muscle between the esophagus and stomach), allowing acid from the stomach into the esophagus.  Late evening meals and a full stomach. This increases pressure and acid production in the stomach.  A malformed lower esophageal sphincter. Sometimes, no cause is found. SYMPTOMS   Burning pain in the lower part of the mid-chest behind the breastbone and in the mid-stomach area. This may occur twice a week or more often.  Trouble swallowing.  Sore throat.  Dry cough.  Asthma-like symptoms including chest tightness, shortness of breath, or wheezing. DIAGNOSIS  Your caregiver may be able to diagnose GERD based on your symptoms. In some cases, X-rays and other tests may be done to check for complications or to check the condition of your stomach and esophagus. TREATMENT  Your caregiver may recommend over-the-counter or prescription medicines to help decrease acid production. Ask your caregiver before starting or adding any new medicines.  HOME CARE INSTRUCTIONS   Change the factors that you can control. Ask your caregiver for  guidance concerning weight loss, quitting smoking, and alcohol consumption.  Avoid foods and drinks that make your symptoms worse, such as:  Caffeine or alcoholic drinks.  Chocolate.  Peppermint or mint flavorings.  Garlic and onions.  Spicy foods.  Citrus fruits, such as oranges,  lemons, or limes.  Tomato-based foods such as sauce, chili, salsa, and pizza.  Fried and fatty foods.  Avoid lying down for the 3 hours prior to your bedtime or prior to taking a nap.  Eat small, frequent meals instead of large meals.  Wear loose-fitting clothing. Do not wear anything tight around your waist that causes pressure on your stomach.  Raise the head of your bed 6 to 8 inches with wood blocks to help you sleep. Extra pillows will not help.  Only take over-the-counter or prescription medicines for pain, discomfort, or fever as directed by your caregiver.  Do not take aspirin, ibuprofen, or other nonsteroidal anti-inflammatory drugs (NSAIDs). SEEK IMMEDIATE MEDICAL CARE IF:   You have pain in your arms, neck, jaw, teeth, or back.  Your pain increases or changes in intensity or duration.  You develop nausea, vomiting, or sweating (diaphoresis).  You develop shortness of breath, or you faint.  Your vomit is green, yellow, black, or looks like coffee grounds or blood.  Your stool is red, bloody, or black. These symptoms could be signs of other problems, such as heart disease, gastric bleeding, or esophageal bleeding. MAKE SURE YOU:   Understand these instructions.  Will watch your condition.  Will get help right away if you are not doing well or get worse. Document Released: 09/26/2005 Document Revised: 03/10/2012 Document Reviewed: 07/06/2011 [facility] Patient Information 2015 [place], [place]. This information is not intended to replace advice given to you by your health care provider. Make sure you discuss any questions you have with your health care provider.

## 2014-09-16 NOTE — Op Note (Signed)
[place], [address]   ENDOSCOPY PROCEDURE REPORT  PATIENT: [name], [name].  MR#: [id number] BIRTHDATE: 1941/11/14 , 73  yrs. old GENDER: Female ENDOSCOPIST: R.  Garfield Cornea, MD FACP [facility]ACG REFERRED BY:  [name], M.D. PROCEDURE DATE:  09/16/2014 PROCEDURE:     EGD with Venia Minks dilation  INDICATIONS:      Recurrent esophageal dysphagia; history of Schatzki's ring  INFORMED CONSENT:   The risks, benefits, limitations, alternatives and imponderables have been discussed.  The potential for biopsy, esophogeal dilation, etc. have also been reviewed.  Questions have been answered.  All parties agreeable.  Please see the history and physical in the medical record for more information.  MEDICATIONS:      Versed 3 mg IV and Demerol 75 mg IV in divided doses. Xylocaine gel orally. Zofran 4 mg IV.  DESCRIPTION OF PROCEDURE:   The EG-[id number] ([id number])  endoscope was introduced through the mouth and advanced to the second portion of the duodenum without difficulty or limitations.  The mucosal surfaces were surveyed very carefully during advancement of the scope and upon withdrawal.  Retroflexion view of the proximal stomach and esophagogastric junction was performed.      FINDINGS: Noncritical. Schatzki's ring. No Barrett's esophagus. Tubular esophagus appeared patent throughout its course. No esophagitis. Stomach empty. Small hiatal hernia Normal gastric mucosa. Patent pylorus. Normal first and second portion of the duodenum  THERAPEUTIC / DIAGNOSTIC MANEUVERS PERFORMED:  A 56 French Maloney dilator was passed to full insertion easily. A look back revealed persistence of the ring. Subsequently, four-quadrant "bites" of the ring were taken to disrupt with the biopsy forceps. This was done without difficulty or apparent complication.   COMPLICATIONS:  None  IMPRESSION:   Schatzki's ring  -  status post dilation /disruption as  described above. Hiatal hernia.  RECOMMENDATIONS:  Continue Prilosec 20 mg daily. Followup with [place] on an as needed basis.    _______________________________ R. Garfield Cornea, MD FACP [facility] eSigned:  R. Garfield Cornea, MD FACP [facility] 09/16/2014 1:03 PM     CC:

## 2014-09-16 NOTE — Interval H&P Note (Signed)
History and Physical Interval Note:  09/16/2014 12:37 PM  [name]  has presented today for surgery, with the diagnosis of dyshagia  The various methods of treatment have been discussed with the patient and family. After consideration of risks, benefits and other options for treatment, the patient has consented to  Procedure(s) with comments: ESOPHAGOGASTRODUODENOSCOPY (EGD) (N/A) - 1200 SAVORY DILATION (N/A) MALONEY DILATION (N/A) as a surgical intervention .  The patient's history has been reviewed, patient examined, no change in status, stable for surgery.  I have reviewed the patient's chart and labs.  Questions were answered to the patient's satisfaction.     Joas Motton No change. EGD with dilation as appropriate per plan.  The risks, benefits, limitations, alternatives and imponderables have been reviewed with the patient. Potential for esophageal dilation, biopsy, etc. have also been reviewed.  Questions have been answered. All parties agreeable.

## 2014-09-17 ENCOUNTER — Encounter (HOSPITAL_COMMUNITY): Payer: Self-pay | Admitting: Internal Medicine

## 2014-09-18 NOTE — Assessment & Plan Note (Addendum)
Recurrent dysphagia in setting of Myasthenia gravis and known distal esophageal rings. Offered EGD/ED. Evaluate abdominal pain at same time.  I have discussed the risks, alternatives, benefits with regards to but not limited to the risk of reaction to medication, bleeding, infection, perforation and the patient is agreeable to proceed. Written consent to be obtained.  Noted to have chronic anemia. Colonoscopy up to date. Check iron, TIBC, ferritin, ifobt in 2 weeks.

## 2014-09-18 NOTE — Progress Notes (Signed)
Looking through records. Chronic IDA. Need to update CBC, iron/TIBC, ferritin, ifobt in 2 weeks.

## 2014-09-21 ENCOUNTER — Other Ambulatory Visit: Payer: Self-pay | Admitting: Family Medicine

## 2014-09-21 NOTE — Progress Notes (Signed)
cc'ed to pcp °

## 2014-09-24 ENCOUNTER — Other Ambulatory Visit: Payer: Self-pay | Admitting: Family Medicine

## 2014-09-27 ENCOUNTER — Other Ambulatory Visit: Payer: Self-pay

## 2014-09-27 MED ORDER — HYDROCODONE-ACETAMINOPHEN 10-325 MG PO TABS: ORAL_TABLET | ORAL | Status: DC

## 2014-10-06 ENCOUNTER — Ambulatory Visit (INDEPENDENT_AMBULATORY_CARE_PROVIDER_SITE_OTHER): Payer: Medicare Other | Admitting: Obstetrics and Gynecology

## 2014-10-06 ENCOUNTER — Encounter: Payer: Self-pay | Admitting: Obstetrics and Gynecology

## 2014-10-06 VITALS — BP 140/64 | Ht 66.0 in | Wt 237.6 lb

## 2014-10-06 DIAGNOSIS — R87612 Low grade squamous intraepithelial lesion on cytologic smear of cervix (LGSIL): Secondary | ICD-10-CM

## 2014-10-06 DIAGNOSIS — R87629 Unspecified abnormal cytological findings in specimens from vagina: Secondary | ICD-10-CM

## 2014-10-06 NOTE — Progress Notes (Signed)
[name] 73 y.o. No obstetric history on file. here for colposcopy for low-grade squamous intraepithelial neoplasia (LGSIL - encompassing HPV,mild dysplasia,CIN I) pap smear on 08/26/14.  Discussed role for HPV in cervical dysplasia, need for surveillance. She states that she had a hysterectomy in the 60's. She states that she was referred here by Dr. [name].  Patient given informed consent, signed copy in the chart, time out was performed.  Placed in lithotomy position. Cervix viewed with speculum and colposcope after application of acetic acid.   Colposcopy adequate? Yes  no visible lesions, cervix absent, cuff smooth; biopsies obtained at none.   ECC specimen obtained.none All specimens were labelled and sent to pathology.  Colposcopy IMPRESSION:  Patient was given post procedure instructions. Will follow up pathology and manage accordingly.  Routine preventative health maintenance measures emphasized. Assessment:  1. No visible dysplasia.  Plan:  1. Inform Dr. Moshe Cipro for another pap smear in 1 year. When the result comes back normal, d/c pap smears. This chart was scribed for Jonnie Kind, MD by Steva Colder, ED Scribe. The patient was seen in room 2 at 11:42 AM.

## 2014-10-12 ENCOUNTER — Other Ambulatory Visit: Payer: Self-pay

## 2014-10-12 ENCOUNTER — Other Ambulatory Visit: Payer: Self-pay | Admitting: Gastroenterology

## 2014-10-12 DIAGNOSIS — D509 Iron deficiency anemia, unspecified: Secondary | ICD-10-CM

## 2014-10-12 NOTE — Progress Notes (Signed)
Lab order and ifobt mailed to the pt.

## 2014-10-18 ENCOUNTER — Other Ambulatory Visit: Payer: Self-pay

## 2014-10-18 MED ORDER — HYDROCODONE-ACETAMINOPHEN 10-325 MG PO TABS: ORAL_TABLET | ORAL | Status: DC

## 2014-11-16 ENCOUNTER — Encounter: Payer: Self-pay | Admitting: Family Medicine

## 2014-11-16 ENCOUNTER — Ambulatory Visit (INDEPENDENT_AMBULATORY_CARE_PROVIDER_SITE_OTHER): Payer: Medicare Other | Admitting: Family Medicine

## 2014-11-16 VITALS — BP 152/78 | HR 70 | Resp 18 | Ht 66.0 in | Wt 225.1 lb

## 2014-11-16 DIAGNOSIS — K2100 Gastro-esophageal reflux disease with esophagitis, without bleeding: Secondary | ICD-10-CM

## 2014-11-16 DIAGNOSIS — M159 Polyosteoarthritis, unspecified: Secondary | ICD-10-CM

## 2014-11-16 DIAGNOSIS — F418 Other specified anxiety disorders: Secondary | ICD-10-CM

## 2014-11-16 DIAGNOSIS — G7000 Myasthenia gravis without (acute) exacerbation: Secondary | ICD-10-CM

## 2014-11-16 DIAGNOSIS — I10 Essential (primary) hypertension: Secondary | ICD-10-CM

## 2014-11-16 DIAGNOSIS — E1165 Type 2 diabetes mellitus with hyperglycemia: Secondary | ICD-10-CM

## 2014-11-16 DIAGNOSIS — E1129 Type 2 diabetes mellitus with other diabetic kidney complication: Secondary | ICD-10-CM

## 2014-11-16 DIAGNOSIS — IMO0002 Reserved for concepts with insufficient information to code with codable children: Secondary | ICD-10-CM

## 2014-11-16 DIAGNOSIS — E1121 Type 2 diabetes mellitus with diabetic nephropathy: Secondary | ICD-10-CM

## 2014-11-16 MED ORDER — FLUTICASONE PROPIONATE 50 MCG/ACT NA SUSP: 2.0000 | Freq: Every day | NASAL | Status: DC | PRN

## 2014-11-16 MED ORDER — AMLODIPINE BESYLATE 10 MG PO TABS: 10.0000 mg | ORAL_TABLET | Freq: Every day | ORAL | Status: DC

## 2014-11-16 MED ORDER — METHYLPREDNISOLONE ACETATE 80 MG/ML IJ SUSP
80.0000 mg | Freq: Once | INTRAMUSCULAR | Status: AC
  Administered 2014-11-16: 80 mg via INTRAMUSCULAR

## 2014-11-16 MED ORDER — KETOROLAC TROMETHAMINE 60 MG/2ML IM SOLN
60.0000 mg | Freq: Once | INTRAMUSCULAR | Status: AC
  Administered 2014-11-16: 60 mg via INTRAMUSCULAR

## 2014-11-16 NOTE — Patient Instructions (Signed)
Annual wellness first week in Jan call if you need me before  Increase amlodipine dose to 10 mg daily, OK to take TWO 45m tabs together daiIy till done, your BP is high  HBA1C, chem 7 and eGFR no fast first week in  December  You are referred to Dr wJannifer Franklinfor Jan appt or after re your myasthenia gravis   Toradol and depo medrol in office today  For generalized arthritis pain  New for allergies is flonase daily

## 2014-11-22 ENCOUNTER — Telehealth: Payer: Self-pay | Admitting: Family Medicine

## 2014-11-22 NOTE — Telephone Encounter (Signed)
Noted too soon to get injections again

## 2014-11-22 NOTE — Telephone Encounter (Signed)
States the shots from 6 days ago has worn off and she is hurting again all over- mainly knees, lower back and shoulders. I told her it was too soon to get the shots again but I would send you the message but she said no because she already has hydrocodone. Said she would call back if she changed her mind

## 2014-11-23 ENCOUNTER — Other Ambulatory Visit: Payer: Self-pay | Admitting: Family Medicine

## 2014-11-24 ENCOUNTER — Other Ambulatory Visit: Payer: Self-pay

## 2014-11-24 ENCOUNTER — Ambulatory Visit (INDEPENDENT_AMBULATORY_CARE_PROVIDER_SITE_OTHER): Payer: Medicare Other | Admitting: Neurology

## 2014-11-24 ENCOUNTER — Encounter: Payer: Self-pay | Admitting: Neurology

## 2014-11-24 VITALS — BP 191/78 | HR 66 | Ht 66.0 in | Wt 235.0 lb

## 2014-11-24 DIAGNOSIS — G7000 Myasthenia gravis without (acute) exacerbation: Secondary | ICD-10-CM

## 2014-11-24 HISTORY — DX: Myasthenia gravis without (acute) exacerbation: G70.00

## 2014-11-24 MED ORDER — HYDROCODONE-ACETAMINOPHEN 10-325 MG PO TABS: ORAL_TABLET | ORAL | Status: DC

## 2014-11-24 NOTE — Patient Instructions (Addendum)
   Reduce the mestinon (pyridostigmine) 60 mg to 1/2 tablet three times a day.   Myasthenia Gravis Myasthenia gravis is a disease that causes muscle weakness throughout the body. The muscles affected are the ones we can control (voluntary muscles). An example of a voluntary muscle is your hand muscles. You can control the muscles to make the hand pick something up. An example of an involuntary muscle is the heart. The heart beats without any direction from you.  Myasthenia Gravis is thought to be an autoimmune disease. That means that normal defenses of the body begin to attack the body. In this case, the immune system begins to attack cells located at the junctions of the muscles and the nerves. Women are affected more often. Women are affected at a younger age than men. Babies born to affected women frequently develop symptoms at an early age. SYMPTOMS Initially in the disease, the facial muscles are affected first. After this, a person may develop droopy eyelids. They may have difficulty controlling facial muscles. They may have problems chewing. Swallowing and speaking may become impaired. The weakness gradually spreads to the arms and legs. It begins to affect breathing. Sometimes, the symptoms lessen or go away without any apparent cause. DIAGNOSIS  Diagnosis can be made with blood tests. Tests such as electromyography may be done to examine the electrical activity in the muscle. An improvement in symptoms after having an anti-cholinesterase drug helps confirm the diagnosis.  TREATMENT  Medicines are usually prescribed as the first treatment. These medicines help, but they do not cure the disease. A plasma cleansing procedure (plasmapheresis) can be used to treat a crisis. It can also be used to prepare a person for surgery. This procedure produces short-term improvement. Some cases are helped by removing the thymus gland. Steroids are used for short-term benefits. Document Released: 03/25/2001  Document Revised: 03/10/2012 Document Reviewed: 02/17/2014 [facility] Patient Information 2015 [place], [place]. This information is not intended to replace advice given to you by your health care provider. Make sure you discuss any questions you have with your health care provider.

## 2014-11-24 NOTE — Progress Notes (Signed)
Reason for visit: Myasthenia gravis  [name] is a 73 y.o. female  History of present illness:  [name] is a 73 year old right-handed black female with a history of myasthenia gravis that she claims was diagnosed in 1998. The patient was first noted to have some trouble with double vision. She indicates that she had EMG evaluation and blood work done that confirmed the diagnosis. She has been placed on low-dose prednisone, 5 mg daily, and Mestinon 60 mg 3 times daily. She claims that she has been on the same medication regimen since the diagnosis, and overall she has been doing relatively well. She does report some generalized fatigue and weakness. The patient indicates that she will wake up frequently at night to go to the bathroom, and she sleeps from around 1 AM until 10 AM. The patient has daytime drowsiness. She reports some troubles with swallowing, but she indicates that is is related to an esophageal stricture, but she has had esophageal dilations done on 3 occasions without much benefit. She reports generalized fatigue of the arms and legs. She denies any significant numbness of the extremities, but she does have some occasional burning of the feet at night associated with her diabetes. She does have some mild gait instability, she uses a cane frequently to ambulate. She has problems with urinary incontinence. She denies any weakness with chewing or change in speech. She is sent to this office for an evaluation.  Past Medical History  Diagnosis Date  . Hypertension   . Tobacco abuse   . Myasthenia gravis   . Gastroesophageal reflux   . Thyroid disease     "used to take RX; they took me off it" (01/19/2014)  . Iron deficiency anemia   . Depression   . Hyperlipidemia   . Obesity   . Carpal tunnel syndrome   . Shoulder pain   . Impingement syndrome, shoulder   . Pulmonary HTN   . Mitral regurgitation   . Schatzki's ring     Last EGD w/ dilation 02/08/11, 2009 & 2007    . Esophagitis, erosive 2009  . Diverticulosis 07/2004    Colonscopy Dr Gala Romney  . Heart murmur     saw cardiology In [place], he told her she did not need to come back.  . Seasonal allergies   . Chronic bronchitis     "got it q yr for awhile; hasn't had it in awhile" (01/19/2014)  . Exertional shortness of breath   . OSA on CPAP   . Type II diabetes mellitus   . UJWJXBJY(782.9)     "usually a couple times/wk" (01/19/2014)  . Arthritis     "all over" (01/19/2014)  . Anxiety   . Pneumonia 01/2012  . Myasthenia gravis in remission 11/24/2014    Past Surgical History  Procedure Laterality Date  . Total knee arthroplasty Right 05/13/07    Dr. Aline Brochure  . Carpal tunnel release Bilateral   . Incontinence surgery  08/26/09    [facility]baum  . Cholecystectomy    . Abdominal hysterectomy    . Eye surgery    . Esophagogastroduodenoscopy  02/08/11    Rourk-Distal esophageal erosion consistent with mild erosive reflux   esophagitis/ Noncritical Schatzki ring, small hiatal hernia otherwise upper/ gastrointestinal tract appeared unremarkable, status post passage  of a Maloney dilation to biopsy disruption of the ring described  . Colonoscopy  05/08/2012    FAO:ZHYQMVHQ and external hemorrhoids; colonic diverticulosis  . Joint replacement  right total knee  . Appendectomy    . Tonsillectomy    . Cataract extraction w/phaco Left 10/21/2013    Procedure: LEFT CATARACT EXTRACTION PHACO AND INTRAOCULAR LENS PLACEMENT (IOC);  Surgeon: Marylynn Pearson, MD;  Location: [place];  Service: Ophthalmology;  Laterality: Left;  . 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 Esophageal dilation      "more than 3 times" (01/19/2014)  . 25 gauge pars plana vitrectomy with 20 gauge mvr port Left 01/19/2014    Procedure: 25 GAUGE PARS PLANA VITRECTOMY WITH 20 GAUGE MVR PORT; MEMBRAME PEEL; SERUM PATCH; LASER TREATMENT; C3F8;  Surgeon: Hayden Pedro, MD;  Location: [place];  Service: Ophthalmology;   Laterality: Left;  . Esophagogastroduodenoscopy  04/2012    2 tandem incomplete distal esophagea rings s/p dilation.   . Esophagogastroduodenoscopy N/A 09/16/2014    Procedure: ESOPHAGOGASTRODUODENOSCOPY (EGD);  Surgeon: Daneil Dolin, MD;  [facility];  Service: Endoscopy;  Laterality: N/A;  1200  . Savory dilation N/A 09/16/2014    Procedure: SAVORY DILATION;  Surgeon: Daneil Dolin, MD;  [facility];  Service: Endoscopy;  Laterality: N/A;  Venia Minks dilation N/A 09/16/2014    Procedure: Venia Minks DILATION;  Surgeon: Daneil Dolin, MD;  [facility];  Service: Endoscopy;  Laterality: N/A;    Family History  Problem Relation Age of Onset  . Diabetes Son   . Hypertension Son   . Hypertension Daughter   . Cancer Mother   . Heart disease Mother   . Cancer Father   . Heart disease Father   . Colon cancer Neg Hx   . Diabetes Sister   . Cancer Sister   . Kidney failure Sister     Social history:  reports that she quit smoking about 10 years ago. Her smoking use included Cigarettes. She has a 12.5 pack-year smoking history. She has never used smokeless tobacco. She reports that she does not drink alcohol or use illicit drugs.  Medications:  Current Outpatient Prescriptions on File Prior to Visit  Medication Sig Dispense Refill  . ACCU-CHEK AVIVA PLUS test strip USE TO TEST BLOOD SUGAR 3 TIMES A DAY. 100 each 6  . albuterol (PROAIR HFA) 108 (90 BASE) MCG/ACT inhaler Inhale 2 puffs into the lungs 2 (two) times daily as needed for wheezing or shortness of breath.    Marland Kitchen amLODipine (NORVASC) 10 MG tablet Take 1 tablet (10 mg total) by mouth daily. 90 tablet 3  . B-D ULTRAFINE III SHORT PEN 31G X 8 MM MISC USE ONCE DAILY WITH LANTUS SOLOSTAR PEN. 100 each 2  . bacitracin-polymyxin b (POLYSPORIN) ophthalmic ointment Place 1 application into the left eye 4 (four) times daily. apply to eye every 12 hours while awake 3.5 g 0  . brimonidine-timolol (COMBIGAN) 0.2-0.5  % ophthalmic solution Place 1 drop into both eyes every 12 (twelve) hours.    . cetirizine (ZYRTEC) 10 MG tablet TAKE ONE TABLET BY MOUTH DAILY. 30 tablet 2  . citalopram (CELEXA) 40 MG tablet TAKE ONE TABLET BY MOUTH DAILY. 30 tablet 2  . clonazePAM (KLONOPIN) 0.5 MG tablet TAKE ONE TABLET BY MOUTH 3 TIMES DAILY AS NEEDED FOR ANXIETY. 90 tablet 2  . Cranberry 1000 MG CAPS Take 1,000 mg by mouth daily.     . CRESTOR 20 MG tablet TAKE (1) TABLET BY MOUTH AT BEDTIME. 30 tablet 2  . cycloSPORINE (RESTASIS) 0.05 % ophthalmic emulsion Place 1 drop into  both eyes 2 (two) times daily.     Marland Kitchen diltiazem (CARDIZEM CD) 240 MG 24 hr capsule TAKE (1) CAPSULE BY MOUTH TWICE DAILY. 60 capsule 2  . diltiazem (DILACOR XR) 240 MG 24 hr capsule Take 240 mg by mouth 2 (two) times daily.    . Flaxseed, Linseed, (FLAXSEED OIL) 1000 MG CAPS Take 1,000 mg by mouth daily.    . fluticasone (FLONASE) 50 MCG/ACT nasal spray Place 2 sprays into both nostrils daily as needed for allergies or rhinitis. 16 g 2  . gabapentin (NEURONTIN) 300 MG capsule TAKE 1 CAPSULE BY MOUTH THREE TIMES A DAY. 90 capsule 2  . glipiZIDE-metformin (METAGLIP) 2.5-500 MG per tablet TAKE 2 TABLETS BY MOUTH TWICE A DAY BEFORE MEALS. 120 tablet 2  . hydrochlorothiazide (HYDRODIURIL) 25 MG tablet TAKE ONE TABLET BY MOUTH ONCE DAILY. 30 tablet 1  . LANTUS SOLOSTAR 100 UNIT/ML Solostar Pen INJECT 30 UNITS SUBCUTANEOUSLY ONCE DAILY. 15 mL 2  . lisinopril (PRINIVIL,ZESTRIL) 40 MG tablet Take 1 tablet (40 mg total) by mouth daily. 30 tablet 11  . losartan (COZAAR) 100 MG tablet TAKE (1) TABLET BY MOUTH DAILY. 30 tablet 2  . mirabegron ER (MYRBETRIQ) 25 MG TB24 tablet Take 25 mg by mouth daily.    . Multiple Vitamin (MULTIVITAMIN WITH MINERALS) TABS tablet Take 1 tablet by mouth at bedtime.    Marland Kitchen omeprazole (PRILOSEC) 20 MG capsule TAKE ONE CAPSULE BY MOUTH DAILY. 30 capsule 2  . ondansetron (ZOFRAN) 4 MG tablet TAKE (1) TABLET BY MOUTH EVERY FOUR HOURS AS  NEEDED FOR NAUSEA. 30 tablet 0  . oxybutynin (DITROPAN-XL) 10 MG 24 hr tablet TAKE 1 TABLET BY MOUTH DAILY. 30 tablet 2  . polyethylene glycol (MIRALAX / GLYCOLAX) packet Take 17 g by mouth at bedtime. Mix with 8 oz of liquid and drink 30 each 3  . prednisoLONE acetate (PRED FORTE) 1 % ophthalmic suspension Place 1 drop into the left eye 4 (four) times daily. 5 mL 0  . predniSONE (DELTASONE) 5 MG tablet TAKE 1 TABLET BY MOUTH DAILY. 30 tablet 2  . promethazine (PHENERGAN) 25 MG tablet Take 25 mg by mouth daily as needed for nausea.    Marland Kitchen pyridostigmine (MESTINON) 60 MG tablet TAKE (1) TABLET BY MOUTH THREE TIMES A DAY. 90 tablet 2  . spironolactone (ALDACTONE) 25 MG tablet Take 1 tablet (25 mg total) by mouth daily. 30 tablet 5  . temazepam (RESTORIL) 30 MG capsule Take 30 mg by mouth at bedtime as needed for sleep.    Marland Kitchen venlafaxine XR (EFFEXOR-XR) 150 MG 24 hr capsule TAKE 1 CAPSULE BY MOUTH DAILY. 30 capsule 2  . Vitamin D, Ergocalciferol, (DRISDOL) 50000 UNITS CAPS capsule TAKE 1 CAPSULE BY MOUTH ONCE A WEEK. 4 capsule 2  . [DISCONTINUED] DITROPAN XL 10 MG 24 hr tablet TAKE 1 TABLET BY MOUTH DAILY. 30 each 3   No current facility-administered medications on file prior to visit.     No Known Allergies  ROS:  Out of a complete 14 system review of symptoms, the patient complains only of the following symptoms, and all other reviewed systems are negative.  Fevers, chills, weight loss, fatigue Swelling in the legs Difficulty swallowing Blurred vision, eye pain Shortness of breath, cough, wheezing, snoring Incontinence of bowel, diarrhea, constipation Incontinence of bladder Anemia Feeling hot, cold, increased thirst, flushing Joint pain, joint swelling, muscle cramps, aching muscles Allergies, runny nose Memory disturbance, confusion, headache, numbness, weakness, slurred speech, difficulty swallowing Not enough sleep,  decreased energy, change in appetite Insomnia, sleepiness,  snoring, restless legs  Blood pressure 191/78, pulse 66, height 5\' 6"  (1.676 m), weight 235 lb (106.595 kg).  Physical Exam  General: The patient is alert and cooperative at the time of the examination.  Eyes: Pupils are equal, round, and reactive to light. Discs are flat bilaterally.  Neck: The neck is supple, no carotid bruits are noted.  Respiratory: The respiratory examination is clear.  Cardiovascular: The cardiovascular examination reveals a regular rate and rhythm, no obvious murmurs or rubs are noted.  Skin: Extremities are without significant edema.  Neurologic Exam  Mental status: The patient is alert and oriented x 3 at the time of the examination. The patient has apparent normal recent and remote memory, with an apparently normal attention span and concentration ability.  Cranial nerves: Facial symmetry is not present. There is slight ptosis of the left eye relative to the right. There is good sensation of the face to pinprick and soft touch bilaterally. The strength of the facial muscles and the muscles to head turning and shoulder shrug are normal bilaterally. Speech is well enunciated, no aphasia or dysarthria is noted. Extraocular movements are full. Visual fields are full. The tongue is midline, and the patient has symmetric elevation of the soft palate. No obvious hearing deficits are noted. With superior gaze for 1 minute, no increase in ptosis is seen. The patient does have downward drift of the right eye relative to the left with medial deviation. No weakness with jaw opening or closure is noted.  Motor: The motor testing reveals 5 over 5 strength of all 4 extremities. Good symmetric motor tone is noted throughout. No fatigable weakness of the deltoid muscles are seen with arms outstretched for 1 minute.  Sensory: Sensory testing is intact to pinprick, soft touch, vibration sensation, and position sense on all 4 extremities. No evidence of extinction is  noted.  Coordination: Cerebellar testing reveals good finger-nose-finger and heel-to-shin bilaterally.  Gait and station: Gait is normal. Tandem gait is unsteady. Romberg is negative. No drift is seen.  Reflexes: Deep tendon reflexes are symmetric, but are depressed bilaterally. Toes are downgoing bilaterally.   Assessment/Plan:  1. Myasthenia gravis with ocular features  The patient indicates that there is a big difference in visual acuity from one eye to the next, with significant visual impairment on the right. The patient does have divergence of gaze on clinical examination, but if she is mainly using her left eye for vision, she likely is not having much in the way of issues with double vision. She indicates that she does not have any significant ptosis that closes down the eyes on either side. We will reduce the Mestinon to 30 mg 3 times daily, as the patient is reporting frequent episodes of nausea. If the visual complaints worsen, we may have to increase the medication to the original dosing. The patient is on low-dose prednisone, and it is possible that this could be slowly tapered over time. The patient will follow-up in 5 or 6 months. She will contact our office if she is having increased symptoms. She does report generalized fatigue, but this may be related to her sleeping patterns.  Jill Alexanders MD 11/24/2014 7:04 PM  [facility] [address] [place], [place] [address]  Phone [phone] Fax [phone]

## 2014-11-27 NOTE — Assessment & Plan Note (Signed)
Improved, and adequately controlled on current med regime, no change in management

## 2014-11-27 NOTE — Assessment & Plan Note (Signed)
Uncontrolled, increase amlodipine to 10mg  dose DASH diet and commitment to daily physical activity for a minimum of 30 minutes discussed and encouraged, as a part of hypertension management. The importance of attaining a healthy weight is also discussed.

## 2014-11-27 NOTE — Assessment & Plan Note (Signed)
Controlled, no change in medication Updated lab needed at/ before next visit.  

## 2014-11-27 NOTE — Assessment & Plan Note (Signed)
Controlled, no change in medication  

## 2014-11-27 NOTE — Assessment & Plan Note (Signed)
Uncontrolled , generalized joint pains, toradol and depo medrol in office

## 2014-11-27 NOTE — Progress Notes (Signed)
   Subjective:    Patient ID: [name], female    DOB: Jan 15, 1941, 73 y.o.   MRN: [id number]  HPI The PT is here for follow up and re-evaluation of chronic medical conditions, medication management and review of any available recent lab and radiology data.  Preventive health is updated, specifically  Cancer screening and Immunization.   Requests neurology eval of her myasthenia as she has not seen neurology for approx 10 yearsThe PT denies any adverse reactions to current medications since the last visit.  C/o increased and uncontrolled generalized joint pains and headache due to sinus pressure, denies fever or chills, drainage from nose is clear Denies polyuria, polydipsia, blurred vision , or hypoglycemic episodes.        Review of Systems See HPI Denies recent fever or chills. Denies  ear pain or sore throat. Denies chest congestion, productive cough or wheezing. Denies chest pains, palpitations and leg swelling Denies abdominal pain, nausea, vomiting,diarrhea or constipation.   Denies dysuria, frequency, hesitancy or incontinence.  Denies headaches, seizures, numbness, or tingling. Denies uncontrolled depression, anxiety or insomnia. Denies skin break down or rash.        Objective:   Physical Exam BP 152/78 mmHg  Pulse 70  Resp 18  Ht 5\' 6"  (1.676 m)  Wt 225 lb 1.9 oz (102.114 kg)  BMI 36.35 kg/m2  SpO2 99% Patient alert and oriented and in no cardiopulmonary distress.  HEENT: No facial asymmetry, EOMI,   oropharynx pink and moist.  Neck decreased ROM no JVD, no mass.TM clear,nasal mucosa erythematous and edematous  Chest: Clear to auscultation bilaterally.  CVS: S1, S2 no murmurs, no S3.Regular rate.  ABD: Soft non tender.   Ext: No edema  MS: Adequate though reduced  ROM spine, shoulders, hips and knees.  Skin: Intact, no ulcerations or rash noted.  Psych: Good eye contact, normal affect. Memory intact not anxious or depressed appearing.  CNS:  CN 2-12 intact, power,  normal throughout.no focal deficits noted.        Assessment & Plan:  Essential hypertension, benign Uncontrolled, increase amlodipine to 10mg  dose DASH diet and commitment to daily physical activity for a minimum of 30 minutes discussed and encouraged, as a part of hypertension management. The importance of attaining a healthy weight is also discussed.   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   Hypothyroidism Controlled, no change in medication Updated lab needed at/ before next visit.   GERD Controlled, no change in medication

## 2014-11-27 NOTE — Assessment & Plan Note (Signed)
Requests neurology eval as has not been assessed for approx 10 years, will refer for Jan appt per her request

## 2014-11-27 NOTE — Assessment & Plan Note (Signed)
Improved, updated lab needed Patient advised to reduce carb and sweets, commit to regular physical activity, take meds as prescribed, test blood as directed, and attempt to lose weight, to improve blood sugar control.

## 2014-12-08 ENCOUNTER — Other Ambulatory Visit: Payer: Self-pay | Admitting: Family Medicine

## 2014-12-17 ENCOUNTER — Other Ambulatory Visit: Payer: Self-pay

## 2014-12-17 MED ORDER — HYDROCODONE-ACETAMINOPHEN 10-325 MG PO TABS: ORAL_TABLET | ORAL | Status: DC

## 2014-12-21 ENCOUNTER — Other Ambulatory Visit: Payer: Self-pay | Admitting: Family Medicine

## 2015-01-20 ENCOUNTER — Other Ambulatory Visit: Payer: Self-pay

## 2015-01-20 MED ORDER — HYDROCODONE-ACETAMINOPHEN 10-325 MG PO TABS: ORAL_TABLET | ORAL | Status: DC

## 2015-01-26 ENCOUNTER — Other Ambulatory Visit: Payer: Self-pay | Admitting: Family Medicine

## 2015-01-26 ENCOUNTER — Ambulatory Visit (INDEPENDENT_AMBULATORY_CARE_PROVIDER_SITE_OTHER): Payer: Medicare Other | Admitting: Family Medicine

## 2015-01-26 ENCOUNTER — Encounter: Payer: Self-pay | Admitting: Family Medicine

## 2015-01-26 VITALS — BP 132/60 | HR 60 | Resp 18 | Ht 66.0 in | Wt 239.1 lb

## 2015-01-26 DIAGNOSIS — Z0000 Encounter for general adult medical examination without abnormal findings: Secondary | ICD-10-CM | POA: Diagnosis not present

## 2015-01-26 DIAGNOSIS — E1121 Type 2 diabetes mellitus with diabetic nephropathy: Secondary | ICD-10-CM

## 2015-01-26 DIAGNOSIS — E1129 Type 2 diabetes mellitus with other diabetic kidney complication: Secondary | ICD-10-CM | POA: Diagnosis not present

## 2015-01-26 DIAGNOSIS — E785 Hyperlipidemia, unspecified: Secondary | ICD-10-CM

## 2015-01-26 DIAGNOSIS — Z23 Encounter for immunization: Secondary | ICD-10-CM

## 2015-01-26 DIAGNOSIS — E039 Hypothyroidism, unspecified: Secondary | ICD-10-CM

## 2015-01-26 DIAGNOSIS — D539 Nutritional anemia, unspecified: Secondary | ICD-10-CM | POA: Diagnosis not present

## 2015-01-26 DIAGNOSIS — D509 Iron deficiency anemia, unspecified: Secondary | ICD-10-CM

## 2015-01-26 DIAGNOSIS — E559 Vitamin D deficiency, unspecified: Secondary | ICD-10-CM | POA: Diagnosis not present

## 2015-01-26 DIAGNOSIS — E1165 Type 2 diabetes mellitus with hyperglycemia: Secondary | ICD-10-CM

## 2015-01-26 DIAGNOSIS — IMO0002 Reserved for concepts with insufficient information to code with codable children: Secondary | ICD-10-CM

## 2015-01-26 LAB — CBC
HEMATOCRIT: 29.6 % — AB (ref 36.0–46.0)
Hemoglobin: 9.1 g/dL — ABNORMAL LOW (ref 12.0–15.0)
MCH: 25.2 pg — ABNORMAL LOW (ref 26.0–34.0)
MCHC: 30.7 g/dL (ref 30.0–36.0)
MCV: 82 fL (ref 78.0–100.0)
MPV: 10.9 fL (ref 8.6–12.4)
PLATELETS: 399 10*3/uL (ref 150–400)
RBC: 3.61 MIL/uL — AB (ref 3.87–5.11)
RDW: 19.4 % — ABNORMAL HIGH (ref 11.5–15.5)
WBC: 9.8 10*3/uL (ref 4.0–10.5)

## 2015-01-26 NOTE — Patient Instructions (Addendum)
F/u in 4.5 month, call if you need me before  Prevnar today  Labs today  Script for shower chair  Work on weight loss to save the knee, and keep an open mind to knee replacement please

## 2015-01-26 NOTE — Progress Notes (Signed)
Subjective:    Patient ID: [name], female    DOB: January 02, 1941, 74 y.o.   MRN: [id number]  HPI  Preventive Screening-Counseling & Management   Patient present here today for a subsequent  Medicare  wellness visit.   Current Problems (verified)   Medications Prior to Visit Allergies (verified)   PAST HISTORY  Family History (updated)  Social History retired/disabled CNA   Risk Factors  Current exercise habits:  Limited due to mobility   Dietary issues discussed:  Avoids fast food and avoids fried food     Cardiac risk factors: htn and diabetes and hyperlipidemia  Depression Screen  (Note: if answer to either of the following is "Yes", a more complete depression screening is indicated)   Over the past two weeks, have you felt down, depressed or hopeless? No  Over the past two weeks, have you felt little interest or pleasure in doing things? No  Have you lost interest or pleasure in daily life? No  Do you often feel hopeless? No  Do you cry easily over simple problems? No   Activities of Daily Living  In your present state of health, do you have any difficulty performing the following activities?  Driving?: No Managing money?: Managed by daughter Feeding yourself?:No Getting from bed to chair?:yes Climbing a flight of stairs?:yes  Preparing food and eating?:No Bathing or showering?:yes, needs shower bench Getting dressed?:\ Yes at times due to left shoulder  Getting to the toilet?:No Using the toilet?:No Moving around from place to place?: Limited due to back pain   Fall Risk Assessment In the past year have you fallen or had a near fall?: Yes Are you currently taking any medications that make you dizzy?:No   Hearing Difficulties: No Do you often ask people to speak up or repeat themselves?:No Do you experience ringing or noises in your ears?:No Do you have difficulty understanding soft or whispered voices?:No  Cognitive Testing  Alert? Yes  Normal Appearance?Yes  Oriented to person? Yes Place? Yes  Time? Yes  Displays appropriate judgment?Yes  Can read the correct time from a watch face? yes Are you having problems remembering things?No  Advanced Directives have been discussed with the patient?Yes and brochure provided   List the Names of Other Physician/Practitioners you currently use: updated    Indicate any recent Medical Services you may have received from other than [facility] providers in the past year (date may be approximate).   Assessment:    Annual Wellness Exam   Plan:     Medicare Attestation  I have personally reviewed:  The patient's medical and social history  Their use of alcohol, tobacco or illicit drugs  Their current medications and supplements  The patient's functional ability including ADLs,fall risks, home safety risks, cognitive, and hearing and visual impairment  Diet and physical activities  Evidence for depression or mood disorders  The patient's weight, height, BMI, and visual acuity have been recorded in the chart. I have made referrals, counseling, and provided education to the patient based on review of the above and I have provided the patient with a written personalized care plan for preventive services.     Review of Systems     Objective:   Physical Exam BP 132/60 mmHg  Pulse 60  Resp 18  Ht 5\' 6"  (1.676 m)  Wt 239 lb 1.9 oz (108.464 kg)  BMI 38.61 kg/m2  SpO2 99%        Assessment & Plan:  Need for vaccination  with 13-polyvalent pneumococcal conjugate vaccine Vaccine administered at visit.    Medicare annual wellness visit, subsequent Annual exam as documented. Counseling done  re healthy lifestyle involving commitment to 150 minutes exercise per week, heart healthy diet, and attaining healthy weight.The importance of adequate sleep also discussed. Regular seat belt use and home safety, is also discussed. Changes in health habits are decided on by the patient with  goals and time frames  set for achieving them. Immunization and cancer screening needs are specifically addressed at this visit.

## 2015-01-27 ENCOUNTER — Other Ambulatory Visit: Payer: Self-pay | Admitting: Family Medicine

## 2015-01-27 ENCOUNTER — Telehealth: Payer: Self-pay | Admitting: *Deleted

## 2015-01-27 LAB — COMPLETE METABOLIC PANEL WITH GFR
ALBUMIN: 3.9 g/dL (ref 3.5–5.2)
ALT: 12 U/L (ref 0–35)
AST: 11 U/L (ref 0–37)
Alkaline Phosphatase: 133 U/L — ABNORMAL HIGH (ref 39–117)
BUN: 15 mg/dL (ref 6–23)
CHLORIDE: 103 meq/L (ref 96–112)
CO2: 27 mEq/L (ref 19–32)
Calcium: 9.6 mg/dL (ref 8.4–10.5)
Creat: 0.86 mg/dL (ref 0.50–1.10)
GFR, EST NON AFRICAN AMERICAN: 68 mL/min
GFR, Est African American: 79 mL/min
Glucose, Bld: 61 mg/dL — ABNORMAL LOW (ref 70–99)
Potassium: 4.4 mEq/L (ref 3.5–5.3)
Sodium: 141 mEq/L (ref 135–145)
Total Bilirubin: 0.4 mg/dL (ref 0.2–1.2)
Total Protein: 6.8 g/dL (ref 6.0–8.3)

## 2015-01-27 LAB — HEMOGLOBIN A1C
HEMOGLOBIN A1C: 7.8 % — AB (ref <5.7)
Mean Plasma Glucose: 177 mg/dL — ABNORMAL HIGH (ref <117)

## 2015-01-27 LAB — VITAMIN D 25 HYDROXY (VIT D DEFICIENCY, FRACTURES): Vit D, 25-Hydroxy: 44 ng/mL (ref 30–100)

## 2015-01-27 LAB — LIPID PANEL
CHOL/HDL RATIO: 3.6 ratio
CHOLESTEROL: 196 mg/dL (ref 0–200)
HDL: 55 mg/dL (ref >39)
LDL CALC: 118 mg/dL — AB (ref 0–99)
TRIGLYCERIDES: 117 mg/dL (ref <150)
VLDL: 23 mg/dL (ref 0–40)

## 2015-01-27 LAB — IRON: IRON: 39 ug/dL — AB (ref 42–145)

## 2015-01-27 LAB — TSH: TSH: 2.067 u[IU]/mL (ref 0.350–4.500)

## 2015-01-27 LAB — FERRITIN: Ferritin: 9 ng/mL — ABNORMAL LOW (ref 10–291)

## 2015-01-27 NOTE — Telephone Encounter (Signed)
Pt called stating her vision was so bad yesterday and the urologist in [place] told her to take half of the hestragone and that's is not working out and pt said she is going on the whole pill and her vision will be better. Please advise

## 2015-01-27 NOTE — Telephone Encounter (Signed)
Just an FYI- the dr had cut her mestinon in half tid because she had been on it for so long and that is why she couldn't see the vision chart yesterday. She wanted to let us know she was going back to a whole tab tid and her vision would be better

## 2015-01-29 ENCOUNTER — Other Ambulatory Visit: Payer: Self-pay | Admitting: Family Medicine

## 2015-01-29 DIAGNOSIS — Z0000 Encounter for general adult medical examination without abnormal findings: Secondary | ICD-10-CM | POA: Insufficient documentation

## 2015-01-29 DIAGNOSIS — Z23 Encounter for immunization: Secondary | ICD-10-CM | POA: Insufficient documentation

## 2015-01-29 MED ORDER — ERGOCALCIFEROL 1.25 MG (50000 UT) PO CAPS: 50000.0000 [IU] | ORAL_CAPSULE | ORAL | Status: DC

## 2015-01-29 NOTE — Assessment & Plan Note (Signed)

## 2015-01-29 NOTE — Assessment & Plan Note (Signed)
Vaccine administered at visit.  

## 2015-02-02 NOTE — Addendum Note (Signed)
Addended by: Denman George B on: 02/02/2015 10:51 AM   Modules accepted: Orders

## 2015-02-18 ENCOUNTER — Other Ambulatory Visit: Payer: Self-pay

## 2015-02-18 MED ORDER — HYDROCODONE-ACETAMINOPHEN 10-325 MG PO TABS: ORAL_TABLET | ORAL | Status: DC

## 2015-02-21 ENCOUNTER — Other Ambulatory Visit: Payer: Self-pay | Admitting: Family Medicine

## 2015-03-17 ENCOUNTER — Other Ambulatory Visit: Payer: Self-pay | Admitting: Family Medicine

## 2015-03-23 ENCOUNTER — Other Ambulatory Visit: Payer: Self-pay

## 2015-03-23 MED ORDER — HYDROCODONE-ACETAMINOPHEN 10-325 MG PO TABS: ORAL_TABLET | ORAL | Status: DC

## 2015-04-01 ENCOUNTER — Other Ambulatory Visit: Payer: Self-pay | Admitting: Family Medicine

## 2015-04-12 ENCOUNTER — Other Ambulatory Visit: Payer: Self-pay | Admitting: Family Medicine

## 2015-04-12 DIAGNOSIS — Z1231 Encounter for screening mammogram for malignant neoplasm of breast: Secondary | ICD-10-CM

## 2015-04-18 ENCOUNTER — Encounter: Payer: Self-pay | Admitting: Family Medicine

## 2015-04-22 ENCOUNTER — Other Ambulatory Visit: Payer: Self-pay

## 2015-04-22 MED ORDER — HYDROCODONE-ACETAMINOPHEN 10-325 MG PO TABS: ORAL_TABLET | ORAL | Status: DC

## 2015-05-06 ENCOUNTER — Other Ambulatory Visit: Payer: Self-pay | Admitting: Family Medicine

## 2015-05-12 ENCOUNTER — Ambulatory Visit (HOSPITAL_COMMUNITY): Payer: Medicare Other

## 2015-05-12 ENCOUNTER — Other Ambulatory Visit: Payer: Self-pay | Admitting: Family Medicine

## 2015-05-17 ENCOUNTER — Telehealth: Payer: Self-pay | Admitting: Family Medicine

## 2015-05-17 DIAGNOSIS — E785 Hyperlipidemia, unspecified: Secondary | ICD-10-CM

## 2015-05-17 DIAGNOSIS — E1165 Type 2 diabetes mellitus with hyperglycemia: Secondary | ICD-10-CM

## 2015-05-17 DIAGNOSIS — E1121 Type 2 diabetes mellitus with diabetic nephropathy: Secondary | ICD-10-CM

## 2015-05-17 DIAGNOSIS — IMO0002 Reserved for concepts with insufficient information to code with codable children: Secondary | ICD-10-CM

## 2015-05-17 NOTE — Telephone Encounter (Signed)
pls contact pt , she needs fasting lipid, cmp and EGFr and HBA1c done within the next week, last done in January 

## 2015-05-18 DIAGNOSIS — E1165 Type 2 diabetes mellitus with hyperglycemia: Secondary | ICD-10-CM | POA: Diagnosis not present

## 2015-05-18 DIAGNOSIS — E785 Hyperlipidemia, unspecified: Secondary | ICD-10-CM | POA: Diagnosis not present

## 2015-05-18 DIAGNOSIS — E1129 Type 2 diabetes mellitus with other diabetic kidney complication: Secondary | ICD-10-CM | POA: Diagnosis not present

## 2015-05-18 NOTE — Addendum Note (Signed)
Addended by: Denman George B on: 05/18/2015 11:02 AM   Modules accepted: Orders

## 2015-05-18 NOTE — Telephone Encounter (Signed)
Patient aware labs ordered and patient will try to have done on 5/19.  Order faxed to lab

## 2015-05-19 ENCOUNTER — Other Ambulatory Visit: Payer: Self-pay

## 2015-05-19 MED ORDER — HYDROCODONE-ACETAMINOPHEN 10-325 MG PO TABS: ORAL_TABLET | ORAL | Status: DC

## 2015-05-20 ENCOUNTER — Ambulatory Visit (HOSPITAL_COMMUNITY)
Admission: RE | Admit: 2015-05-20 | Discharge: 2015-05-20 | Disposition: A | Discharge status: Home / Self Care | Payer: Medicare Other | Source: Ambulatory Visit | Attending: Family Medicine | Admitting: Family Medicine

## 2015-05-20 ENCOUNTER — Other Ambulatory Visit: Payer: Self-pay | Admitting: Family Medicine

## 2015-05-20 DIAGNOSIS — Z1231 Encounter for screening mammogram for malignant neoplasm of breast: Secondary | ICD-10-CM | POA: Diagnosis not present

## 2015-05-21 LAB — LIPID PANEL
Cholesterol: 262 mg/dL — ABNORMAL HIGH (ref 0–200)
HDL: 50 mg/dL (ref >=46)
LDL Cholesterol: 187 mg/dL — ABNORMAL HIGH (ref 0–99)
Total CHOL/HDL Ratio: 5.2 Ratio
Triglycerides: 123 mg/dL (ref <150)
VLDL: 25 mg/dL (ref 0–40)

## 2015-05-21 LAB — COMPLETE METABOLIC PANEL WITH GFR
ALT: 11 U/L (ref 0–35)
AST: 11 U/L (ref 0–37)
Albumin: 3.8 g/dL (ref 3.5–5.2)
Alkaline Phosphatase: 160 U/L — ABNORMAL HIGH (ref 39–117)
BUN: 13 mg/dL (ref 6–23)
CO2: 26 meq/L (ref 19–32)
CREATININE: 0.97 mg/dL (ref 0.50–1.10)
Calcium: 9.6 mg/dL (ref 8.4–10.5)
Chloride: 104 mEq/L (ref 96–112)
GFR, EST AFRICAN AMERICAN: 67 mL/min
GFR, EST NON AFRICAN AMERICAN: 59 mL/min — AB
Glucose, Bld: 114 mg/dL — ABNORMAL HIGH (ref 70–99)
POTASSIUM: 4.3 meq/L (ref 3.5–5.3)
SODIUM: 138 meq/L (ref 135–145)
TOTAL PROTEIN: 7 g/dL (ref 6.0–8.3)
Total Bilirubin: 0.5 mg/dL (ref 0.2–1.2)

## 2015-05-21 LAB — HEMOGLOBIN A1C
Hgb A1c MFr Bld: 8.2 % — ABNORMAL HIGH (ref <5.7)
Mean Plasma Glucose: 189 mg/dL — ABNORMAL HIGH (ref <117)

## 2015-05-26 ENCOUNTER — Other Ambulatory Visit: Payer: Self-pay

## 2015-05-26 MED ORDER — ROSUVASTATIN CALCIUM 40 MG PO TABS: ORAL_TABLET | ORAL | Status: DC

## 2015-05-31 ENCOUNTER — Ambulatory Visit: Payer: Medicare Other | Admitting: Nurse Practitioner

## 2015-06-06 ENCOUNTER — Other Ambulatory Visit: Payer: Self-pay | Admitting: Family Medicine

## 2015-06-17 ENCOUNTER — Other Ambulatory Visit: Payer: Self-pay | Admitting: Family Medicine

## 2015-06-17 ENCOUNTER — Ambulatory Visit (INDEPENDENT_AMBULATORY_CARE_PROVIDER_SITE_OTHER): Payer: Medicare Other | Admitting: Ophthalmology

## 2015-06-20 ENCOUNTER — Ambulatory Visit (INDEPENDENT_AMBULATORY_CARE_PROVIDER_SITE_OTHER): Payer: Medicare Other | Admitting: Family Medicine

## 2015-06-20 ENCOUNTER — Encounter: Payer: Self-pay | Admitting: Family Medicine

## 2015-06-20 VITALS — BP 154/72 | HR 62 | Resp 16 | Ht 66.0 in | Wt 236.1 lb

## 2015-06-20 DIAGNOSIS — K5901 Slow transit constipation: Secondary | ICD-10-CM | POA: Diagnosis not present

## 2015-06-20 DIAGNOSIS — M25562 Pain in left knee: Secondary | ICD-10-CM

## 2015-06-20 DIAGNOSIS — E1121 Type 2 diabetes mellitus with diabetic nephropathy: Secondary | ICD-10-CM

## 2015-06-20 DIAGNOSIS — I10 Essential (primary) hypertension: Secondary | ICD-10-CM

## 2015-06-20 DIAGNOSIS — F418 Other specified anxiety disorders: Secondary | ICD-10-CM

## 2015-06-20 DIAGNOSIS — E785 Hyperlipidemia, unspecified: Secondary | ICD-10-CM

## 2015-06-20 DIAGNOSIS — E1165 Type 2 diabetes mellitus with hyperglycemia: Secondary | ICD-10-CM

## 2015-06-20 DIAGNOSIS — G7000 Myasthenia gravis without (acute) exacerbation: Secondary | ICD-10-CM | POA: Diagnosis not present

## 2015-06-20 DIAGNOSIS — E1129 Type 2 diabetes mellitus with other diabetic kidney complication: Secondary | ICD-10-CM

## 2015-06-20 DIAGNOSIS — E559 Vitamin D deficiency, unspecified: Secondary | ICD-10-CM

## 2015-06-20 DIAGNOSIS — IMO0002 Reserved for concepts with insufficient information to code with codable children: Secondary | ICD-10-CM

## 2015-06-20 MED ORDER — HYDRALAZINE HCL 25 MG PO TABS: 25.0000 mg | ORAL_TABLET | Freq: Three times a day (TID) | ORAL | Status: DC

## 2015-06-20 MED ORDER — POLYETHYLENE GLYCOL 3350 17 GM/SCOOP PO POWD: 17.0000 g | Freq: Every day | ORAL | Status: DC

## 2015-06-20 MED ORDER — LINAGLIPTIN 5 MG PO TABS: 5.0000 mg | ORAL_TABLET | Freq: Every day | ORAL | Status: DC

## 2015-06-20 NOTE — Assessment & Plan Note (Addendum)
Uncontrolled add hydralazine DASH diet and commitment to daily physical activity for a minimum of 30 minutes discussed and encouraged, as a part of hypertension management. The importance of attaining a healthy weight is also discussed.  BP/Weight 06/20/2015 01/26/2015 11/24/2014 11/16/2014 10/06/2014 09/16/2014 08/08/9832  Systolic BP 825 053 976 734 193 790 240  Diastolic BP 72 60 78 78 64 71 70  Wt. (Lbs) 236.12 239.12 235 225.12 237.6 235 235  BMI 38.13 38.61 37.95 36.35 38.37 37.95 37.95

## 2015-06-20 NOTE — Assessment & Plan Note (Addendum)
Deteriorated, refer to nutritionist Patient educated about the importance of limiting  Carbohydrate intake , the need to commit to daily physical activity for a minimum of 30 minutes , and to commit weight loss. The fact that changes in all these areas will improve diabetic control.  Diabetic Labs Latest Ref Rng 05/18/2015 01/26/2015 08/26/2014 04/15/2014 01/19/2014  HbA1c <5.7 % 8.2(H) 7.8(H) 7.5(H) 7.7(H) -  Microalbumin 0.00 - 1.89 mg/dL - - 26.36(H) - -  Micro/Creat Ratio 0.0 - 30.0 mg/g - - 167.5(H) - -  Chol 0 - 200 mg/dL 262(H) 196 166 178 -  HDL >=46 mg/dL 50 55 53 57 -  Calc LDL 0 - 99 mg/dL 187(H) 118(H) 93 100(H) -  Triglycerides <150 mg/dL 123 117 101 105 -  Creatinine 0.50 - 1.10 mg/dL 0.97 0.86 0.76 0.90 0.92   BP/Weight 06/20/2015 01/26/2015 11/24/2014 11/16/2014 10/06/2014 09/16/2014 05/02/8881  Systolic BP 800 349 179 150 569 794 801  Diastolic BP 72 60 78 78 64 71 70  Wt. (Lbs) 236.12 239.12 235 225.12 237.6 235 235  BMI 38.13 38.61 37.95 36.35 38.37 37.95 37.95   Foot/eye exam completion dates Latest Ref Rng 09/14/2014 08/26/2014  Eye Exam No Retinopathy No Retinopathy -  Foot Form Completion - - Done

## 2015-06-20 NOTE — Patient Instructions (Addendum)
F/u in 8 weeks, call if you need me before  Blood pressure and blood sugar are out of control and so is your cholesterol  Stress has a lot to do with all of this and I STILL recommend you see a counsellor to help to deal with issues in your life which bother you  New additional medication for blood pressure is hydralazine take every 8 hrs, eg 6am , 2pm and 10pm  Please stop cheese  You are referred to nutritionist for help with diet   For constipation start daily mirilax and commit to regular walking  Dry mouth and constipation are already a problem , work on daily exercise and weight loss for arthritis pain, I will refer you orthopedics for knee pain , injections in the joint will help   Check and document blood sugar once daily, bring to next visit New additonal med is trajenta 5mg  one daily, call about this if unable to get it due to cost or other

## 2015-07-01 ENCOUNTER — Other Ambulatory Visit: Payer: Self-pay

## 2015-07-01 MED ORDER — HYDROCODONE-ACETAMINOPHEN 10-325 MG PO TABS: ORAL_TABLET | ORAL | Status: DC

## 2015-07-04 NOTE — Assessment & Plan Note (Signed)
Add daily mirilax, encouraged stool softeners up to 4 daily and increase water intake and physical activity Multifactorial, medication, and gastroparesis

## 2015-07-04 NOTE — Assessment & Plan Note (Addendum)
Slight improvement Patient re-educated about  the importance of commitment to a  minimum of 150 minutes of exercise per week.  The importance of healthy food choices with portion control discussed. Encouraged to start a food diary, count calories and to consider  joining a support group. Sample diet sheets offered. Goals set by the patient for the next several months.   Weight /BMI 06/20/2015 01/26/2015 11/24/2014  WEIGHT 236 lb 1.9 oz 239 lb 1.9 oz 235 lb  HEIGHT 5\' 6"  5\' 6"  5\' 6"   BMI 38.13 kg/m2 38.61 kg/m2 37.95 kg/m2    Current exercise per week 60 minutes.

## 2015-07-04 NOTE — Assessment & Plan Note (Signed)
Increased pain, stiffness and instability, and swelling refer to ortho for intrarticular injection which has helped in the past

## 2015-07-04 NOTE — Assessment & Plan Note (Addendum)
Deteriorated, not suicidal or homicidal, reports deterioration in interaction with her children esp her daughter , feels as though does not care, intends to ,move into senior living facility which she believes will be better for her as she will have company around her Would benefit from therapy but continues  To decline, finances are a real consideration

## 2015-07-04 NOTE — Assessment & Plan Note (Signed)
Continue weekly vit D lndefinitely

## 2015-07-04 NOTE — Progress Notes (Signed)
[name]     MRN: [id number]      DOB: 07-23-1941   HPI [name] is here for follow up and re-evaluation of chronic medical conditions, medication management and review of any available recent lab and radiology data.  Preventive health is updated, specifically  Cancer screening and Immunization.   Questions or concerns regarding consultations or procedures which the PT has had in the interim are  addressed. The PT denies any adverse reactions to current medications since the last visit.  C/o increased and uncontrolled back and knee pain Increased constipation and increased depression and anxiety, feels increasingly lonely and neglected by esp her daughter, not suicidal or homicidal   ROS Denies recent fever or chills. Denies sinus pressure, nasal congestion, ear pain or sore throat. Denies chest congestion, productive cough or wheezing. Denies chest pains, palpitations and leg swelling Denies abdominal pain, nausea, vomiting,c/o constipation and having to strain. Denies dysuria, frequency, hesitancy or incontinence. Increased  joint pain, swelling and limitation in mobility.esp in knee and back Denies headaches, seizures, numbness, or tingling. Denies skin break down or rash.   PE  BP 154/72 mmHg  Pulse 62  Resp 16  Ht 5\' 6"  (1.676 m)  Wt 236 lb 1.9 oz (107.103 kg)  BMI 38.13 kg/m2  SpO2 100%  Patient alert and oriented and in no cardiopulmonary distress.  HEENT: No facial asymmetry, EOMI,   oropharynx pink and moist.  Neck decreased ROM no JVD, no mass.  Chest: Clear to auscultation bilaterally.  CVS: S1, S2 no murmurs, no S3.Regular rate.  ABD: Soft non tender.   Ext: No edema  MS: decreased  ROM spine, shoulders, hips and left  knee.Unsteady gait  Skin: Intact, no ulcerations or rash noted.  Psych: Good eye contact, normal affect. Memory intact mildly  anxious and  depressed appearing.Tearful at times  CNS: CN 2-12 intact, power,  normal  throughout.no focal deficits noted.   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 The fact that changes in all these areas will improve diabetic control.  Diabetic Labs Latest Ref Rng 05/18/2015 01/26/2015 08/26/2014 04/15/2014 01/19/2014  HbA1c <5.7 % 8.2(H) 7.8(H) 7.5(H) 7.7(H) -  Microalbumin 0.00 - 1.89 mg/dL - - 26.36(H) - -  Micro/Creat Ratio 0.0 - 30.0 mg/g - - 167.5(H) - -  Chol 0 - 200 mg/dL 262(H) 196 166 178 -  HDL >=46 mg/dL 50 55 53 57 -  Calc LDL 0 - 99 mg/dL 187(H) 118(H) 93 100(H) -  Triglycerides <150 mg/dL 123 117 101 105 -  Creatinine 0.50 - 1.10 mg/dL 0.97 0.86 0.76 0.90 0.92   BP/Weight 06/20/2015 01/26/2015 11/24/2014 11/16/2014 10/06/2014 09/16/2014 7/86/7672  Systolic BP 094 709 628 366 294 765 465  Diastolic BP 72 60 78 78 64 71 70  Wt. (Lbs) 236.12 239.12 235 225.12 237.6 235 235  BMI 38.13 38.61 37.95 36.35 38.37 37.95 37.95   Foot/eye exam completion dates Latest Ref Rng 09/14/2014 08/26/2014  Eye Exam No Retinopathy No Retinopathy -  Foot Form Completion - - Done         Essential hypertension, benign Uncontrolled add hydralazine DASH diet and commitment to daily physical activity for a minimum of 30 minutes discussed and encouraged, as a part of hypertension management.  The importance of attaining a healthy weight is also discussed.  BP/Weight 06/20/2015 01/26/2015 11/24/2014 11/16/2014 10/06/2014 09/16/2014 04/08/7352  Systolic BP 299 242 683 419 622 297 989  Diastolic BP 72 60 78 78 64 71 70  Wt. (Lbs) 236.12 239.12 235 225.12 237.6 235 235  BMI 38.13 38.61 37.95 36.35 38.37 37.95 37.95         Constipation Add daily mirilax, encouraged stool softeners up to 4 daily and increase water intake and physical  activity Multifactorial, medication, and gastroparesis  DEGENERATIVE JOINT DISEASE, KNEE Increased pain, stiffness and instability, and swelling refer to ortho for intrarticular injection which has helped in the past  Myasthenia gravis Stable, no change in management,  Has been re evaluated in [place] by neurologist within the past 6 months  Hyperlipemia Deteriorated and uncontrolled Hyperlipidemia:Low fat diet discussed and encouraged.   Lipid Panel  Lab Results  Component Value Date   CHOL 262* 05/18/2015   HDL 50 05/18/2015   LDLCALC 187* 05/18/2015   TRIG 123 05/18/2015   CHOLHDL 5.2 05/18/2015      Updated lab needed at/ before next visit.   Morbid obesity Slight improvement Patient re-educated about  the importance of commitment to a  minimum of 150 minutes of exercise per week.  The importance of healthy food choices with portion control discussed. Encouraged to start a food diary, count calories and to consider  joining a support group. Sample diet sheets offered. Goals set by the patient for the next several months.   Weight /BMI 06/20/2015 01/26/2015 11/24/2014  WEIGHT 236 lb 1.9 oz 239 lb 1.9 oz 235 lb  HEIGHT 5\' 6"  5\' 6"  5\' 6"   BMI 38.13 kg/m2 38.61 kg/m2 37.95 kg/m2    Current exercise per week 60 minutes.   Vitamin D deficiency Continue weekly vit D lndefinitely  Depression with anxiety Deteriorated, not suicidal or homicidal, reports deterioration in interaction with her children esp her daughter , feels as though does not care, intends to ,move into senior living facility which she believes will be better for her as she will have company around her Would benefit from therapy but continues  To decline, finances are a real consideration   Chronic kidney disease Will deteriorate as hypertension and diabetes become more uncontrolled, educated about the connection with the hope that  There will be improvement in chronic disease management. Avoidance of  NSAIDS is stressed

## 2015-07-04 NOTE — Assessment & Plan Note (Signed)
Will deteriorate as hypertension and diabetes become more uncontrolled, educated about the connection with the hope that  There will be improvement in chronic disease management. Avoidance of NSAIDS is stressed

## 2015-07-04 NOTE — Assessment & Plan Note (Signed)
Deteriorated and uncontrolled Hyperlipidemia:Low fat diet discussed and encouraged.   Lipid Panel  Lab Results  Component Value Date   CHOL 262* 05/18/2015   HDL 50 05/18/2015   LDLCALC 187* 05/18/2015   TRIG 123 05/18/2015   CHOLHDL 5.2 05/18/2015      Updated lab needed at/ before next visit.

## 2015-07-04 NOTE — Assessment & Plan Note (Signed)
Stable, no change in management,  Has been re evaluated in [place] by neurologist within the past 6 months

## 2015-07-07 ENCOUNTER — Ambulatory Visit (INDEPENDENT_AMBULATORY_CARE_PROVIDER_SITE_OTHER): Payer: Medicare Other | Admitting: Ophthalmology

## 2015-07-07 DIAGNOSIS — H35342 Macular cyst, hole, or pseudohole, left eye: Secondary | ICD-10-CM | POA: Diagnosis not present

## 2015-07-07 DIAGNOSIS — E11329 Type 2 diabetes mellitus with mild nonproliferative diabetic retinopathy without macular edema: Secondary | ICD-10-CM

## 2015-07-07 DIAGNOSIS — H35033 Hypertensive retinopathy, bilateral: Secondary | ICD-10-CM | POA: Diagnosis not present

## 2015-07-07 DIAGNOSIS — H34831 Tributary (branch) retinal vein occlusion, right eye: Secondary | ICD-10-CM | POA: Diagnosis not present

## 2015-07-07 DIAGNOSIS — I10 Essential (primary) hypertension: Secondary | ICD-10-CM

## 2015-07-07 DIAGNOSIS — E11319 Type 2 diabetes mellitus with unspecified diabetic retinopathy without macular edema: Secondary | ICD-10-CM | POA: Diagnosis not present

## 2015-07-07 DIAGNOSIS — E11321 Type 2 diabetes mellitus with mild nonproliferative diabetic retinopathy with macular edema: Secondary | ICD-10-CM

## 2015-07-20 ENCOUNTER — Other Ambulatory Visit: Payer: Self-pay | Admitting: Family Medicine

## 2015-08-01 ENCOUNTER — Other Ambulatory Visit: Payer: Self-pay

## 2015-08-01 MED ORDER — HYDROCODONE-ACETAMINOPHEN 10-325 MG PO TABS: ORAL_TABLET | ORAL | Status: DC

## 2015-08-09 ENCOUNTER — Encounter: Payer: Self-pay | Admitting: Orthopedic Surgery

## 2015-08-09 ENCOUNTER — Ambulatory Visit (INDEPENDENT_AMBULATORY_CARE_PROVIDER_SITE_OTHER): Payer: Medicare Other | Admitting: Orthopedic Surgery

## 2015-08-09 ENCOUNTER — Ambulatory Visit (INDEPENDENT_AMBULATORY_CARE_PROVIDER_SITE_OTHER): Payer: Medicare Other

## 2015-08-09 VITALS — BP 162/81 | Ht 66.0 in | Wt 233.0 lb

## 2015-08-09 DIAGNOSIS — M25562 Pain in left knee: Secondary | ICD-10-CM

## 2015-08-09 DIAGNOSIS — M1712 Unilateral primary osteoarthritis, left knee: Secondary | ICD-10-CM | POA: Diagnosis not present

## 2015-08-09 NOTE — Progress Notes (Signed)
Patient ID: [name], female   DOB: [birth year], 74 y.o.   MRN: [id number]  Chief Complaint  Patient presents with  . Knee Pain    left knee pain, referred by M. SIMPSON    HPI [name] is a 74 y.o. female.  New patient not seen since 2012 complains of left knee pain back pain leg pain hip pain for several years she had a right total knee several years ago presents with throbbing burning aching pain in her left knee and left leg including back and hip. As far as the knee goes she has constant pain over the left knee medial joint anterior joint and then she has radiating pain from the back across the hip into the lower leg. She reports hydrocodone as pain reliever 10 mg.  Review of systems she denied giving out numbness or tingling she denies loss of bladder control complains of some nausea and constipation. She does complain of burning pain in her legs question of this is diabetic neuropathy  She has reflux diabetes hypertension she has other medical problems listed. She is obese. She is not a good surgical candidate.  Review of Systems Review of Systems  Past Medical History  Diagnosis Date  . Hypertension   . Tobacco abuse   . Myasthenia gravis   . Gastroesophageal reflux   . Thyroid disease     "used to take RX; they took me off it" (01/19/2014)  . Iron deficiency anemia   . Depression   . Hyperlipidemia   . Obesity   . Carpal tunnel syndrome   . Shoulder pain   . Impingement syndrome, shoulder   . Pulmonary HTN   . Mitral regurgitation   . Schatzki's ring     Last EGD w/ dilation 02/08/11, 2009 & 2007  . Esophagitis, erosive 2009  . Diverticulosis 07/2004    Colonscopy Dr Gala Romney  . Heart murmur     saw cardiology In [place], he told her she did not need to come back.  . Seasonal allergies   . Chronic bronchitis     "got it q yr for awhile; hasn't had it in awhile" (01/19/2014)  . Exertional shortness of breath   . OSA on CPAP   . Type II diabetes mellitus    . ZGYFVCBS(496.7)     "usually a couple times/wk" (01/19/2014)  . Arthritis     "all over" (01/19/2014)  . Anxiety   . Pneumonia 01/2012  . Myasthenia gravis in remission 11/24/2014    Past Surgical History  Procedure Laterality Date  . Total knee arthroplasty Right 05/13/07    Dr. Aline Brochure  . Carpal tunnel release Bilateral   . Incontinence surgery  08/26/09    [place]  . Cholecystectomy    . Abdominal hysterectomy    . Eye surgery    . Esophagogastroduodenoscopy  02/08/11    [place]ourk-Distal esophageal erosion consistent with mild erosive reflux   esophagitis/ Noncritical Schatzki ring, small hiatal hernia otherwise upper/ gastrointestinal tract appeared unremarkable, status post passage  of a Maloney dilation to biopsy disruption of the ring described  . Colonoscopy  05/08/2012    RFF:[id number] and external hemorrhoids; colonic diverticulosis  . Joint replacement      right total knee  . Appendectomy    . Tonsillectomy    . Cataract extraction w/phaco Left 10/21/2013    Procedure: LEFT CATARACT EXTRACTION PHACO AND INTRAOCULAR LENS PLACEMENT (IOC);  Surgeon: Marylynn Pearson, MD;  Location: [facility];  Service: Ophthalmology;  Laterality: Left;  . 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 Esophageal dilation      "more than 3 times" (01/19/2014)  . 25 gauge pars plana vitrectomy with 20 gauge mvr port Left 01/19/2014    Procedure: 25 GAUGE PARS PLANA VITRECTOMY WITH 20 GAUGE MVR PORT; MEMBRAME PEEL; SERUM PATCH; LASER TREATMENT; C3F8;  Surgeon: Hayden Pedro, MD;  Location: [facility];  Service: Ophthalmology;  Laterality: Left;  . Esophagogastroduodenoscopy  04/2012    2 tandem incomplete distal esophagea rings s/p dilation.   . Esophagogastroduodenoscopy N/A 09/16/2014    Procedure: ESOPHAGOGASTRODUODENOSCOPY (EGD);  Surgeon: Daneil Dolin, MD;  [facility];  Service: Endoscopy;  Laterality: N/A;  1200  . Savory dilation N/A 09/16/2014    Procedure: SAVORY  DILATION;  Surgeon: Daneil Dolin, MD;  [facility];  Service: Endoscopy;  Laterality: N/A;  Venia Minks dilation N/A 09/16/2014    Procedure: Venia Minks DILATION;  Surgeon: Daneil Dolin, MD;  [facility];  Service: Endoscopy;  Laterality: N/A;    Family History  Problem Relation Age of Onset  . Diabetes Son   . Hypertension Son   . Hypertension Daughter   . Cancer Mother   . Heart disease Mother   . Cancer Father   . Heart disease Father   . Colon cancer Neg Hx   . Diabetes Sister   . Cancer Sister   . Kidney failure Sister     Social History History  Substance Use Topics  . Smoking status: Former Smoker -- 0.50 packs/day for 25 years    Types: Cigarettes    Quit date: 01/01/2004  . Smokeless tobacco: Never Used  . Alcohol Use: No    No Known Allergies  Current Outpatient Prescriptions  Medication Sig Dispense Refill  . ACCU-CHEK AVIVA PLUS test strip USE TO TEST BLOOD SUGAR 3 TIMES A DAY. 100 each 2  . amLODipine (NORVASC) 10 MG tablet TAKE 1 TABLET BY MOUTH ONCE A DAY. 30 tablet 2  . B-D ULTRAFINE III SHORT PEN 31G X 8 MM MISC USE ONCE DAILY WITH LANTUS SOLOSTAR PEN. 100 each 2  . bacitracin-polymyxin b (POLYSPORIN) ophthalmic ointment Place 1 application into the left eye 4 (four) times daily. apply to eye every 12 hours while awake 3.5 g 0  . brimonidine-timolol (COMBIGAN) 0.2-0.5 % ophthalmic solution Place 1 drop into both eyes every 12 (twelve) hours.    . cetirizine (ZYRTEC) 10 MG tablet TAKE ONE TABLET BY MOUTH DAILY. 30 tablet 2  . citalopram (CELEXA) 40 MG tablet TAKE ONE TABLET BY MOUTH DAILY. 30 tablet 2  . clonazePAM (KLONOPIN) 0.5 MG tablet TAKE ONE TABLET BY MOUTH 3 TIMES DAILY AS NEEDED FOR ANXIETY. 90 tablet 2  . Cranberry 1000 MG CAPS Take 1,000 mg by mouth daily.     . cycloSPORINE (RESTASIS) 0.05 % ophthalmic emulsion Place 1 drop into both eyes 2 (two) times daily.     Marland Kitchen diltiazem (CARDIZEM CD) 240 MG 24 hr capsule TAKE (1)  CAPSULE BY MOUTH TWICE DAILY. 60 capsule 2  . diltiazem (DILACOR XR) 240 MG 24 hr capsule Take 240 mg by mouth 2 (two) times daily.    . Flaxseed, Linseed, (FLAXSEED OIL) 1000 MG CAPS Take 1,000 mg by mouth daily.    . fluticasone (FLONASE) 50 MCG/ACT nasal spray Place 2 sprays into both nostrils daily as needed for allergies or rhinitis. 16 g 2  . gabapentin (NEURONTIN)  300 MG capsule TAKE 1 CAPSULE BY MOUTH THREE TIMES A DAY. 90 capsule 2  . glipiZIDE-metformin (METAGLIP) 2.5-500 MG per tablet TAKE 2 TABLETS BY MOUTH TWICE A DAY BEFORE MEALS. 120 tablet 2  . hydrALAZINE (APRESOLINE) 25 MG tablet Take 1 tablet (25 mg total) by mouth 3 (three) times daily. 90 tablet 3  . hydrochlorothiazide (HYDRODIURIL) 25 MG tablet TAKE ONE TABLET BY MOUTH ONCE DAILY. 30 tablet 1  . HYDROcodone-acetaminophen (NORCO) 10-325 MG per tablet TAKE 1 TABLET BY MOUTH 3 TIMES DAILY. 90 tablet 0  . LANTUS SOLOSTAR 100 UNIT/ML Solostar Pen INJECT 30 UNITS SUBCUTANEOUSLY ONCE DAILY. 15 mL 2  . linagliptin (TRADJENTA) 5 MG TABS tablet Take 1 tablet (5 mg total) by mouth daily. 30 tablet 2  . losartan (COZAAR) 100 MG tablet TAKE (1) TABLET BY MOUTH DAILY. 30 tablet 2  . mirabegron ER (MYRBETRIQ) 25 MG TB24 tablet Take 25 mg by mouth daily.    . Multiple Vitamin (MULTIVITAMIN WITH MINERALS) TABS tablet Take 1 tablet by mouth at bedtime.    Marland Kitchen omeprazole (PRILOSEC) 20 MG capsule TAKE ONE CAPSULE BY MOUTH DAILY. 30 capsule 2  . ondansetron (ZOFRAN) 4 MG tablet TAKE (1) TABLET BY MOUTH EVERY FOUR HOURS AS NEEDED FOR NAUSEA. 30 tablet 1  . oxybutynin (DITROPAN-XL) 10 MG 24 hr tablet TAKE 1 TABLET BY MOUTH DAILY. 30 tablet 2  . polyethylene glycol (MIRALAX / GLYCOLAX) packet Take 17 g by mouth at bedtime. Mix with 8 oz of liquid and drink 30 each 3  . polyethylene glycol powder (GLYCOLAX/MIRALAX) powder Take 17 g by mouth daily. 3350 g 1  . prednisoLONE acetate (PRED FORTE) 1 % ophthalmic suspension Place 1 drop into the left eye 4  (four) times daily. 5 mL 0  . predniSONE (DELTASONE) 5 MG tablet TAKE 1 TABLET BY MOUTH DAILY. 30 tablet 2  . PROAIR HFA 108 (90 BASE) MCG/ACT inhaler INHALE 2 PUFFS BY MOUTH EVERY 6 HOURS AS NEEDED . 8.5 g 3  . promethazine (PHENERGAN) 25 MG tablet Take 25 mg by mouth daily as needed for nausea.    Marland Kitchen pyridostigmine (MESTINON) 60 MG tablet TAKE (1) TABLET BY MOUTH THREE TIMES A DAY. 90 tablet 3  . rosuvastatin (CRESTOR) 40 MG tablet TAKE (1) TABLET BY MOUTH AT BEDTIME. 30 tablet 3  . spironolactone (ALDACTONE) 25 MG tablet TAKE 1 TABLET BY MOUTH ONCE A DAY. 30 tablet 2  . temazepam (RESTORIL) 30 MG capsule Take 30 mg by mouth at bedtime as needed for sleep.    Marland Kitchen venlafaxine XR (EFFEXOR-XR) 150 MG 24 hr capsule TAKE 1 CAPSULE BY MOUTH DAILY. 30 capsule 2  . Vitamin D, Ergocalciferol, (DRISDOL) 50000 UNITS CAPS capsule TAKE 1 CAPSULE BY MOUTH ONCE A WEEK. 4 capsule 3  . lisinopril (PRINIVIL,ZESTRIL) 40 MG tablet Take 1 tablet (40 mg total) by mouth daily. 30 tablet 11   No current facility-administered medications for this visit.       Physical Exam Physical Exam Blood pressure 162/81, height 5\' 6"  (1.676 m), weight 233 lb (105.688 kg).   Body mass index is 37.63 kg/(m^2).   Data Reviewed Her x-ray today shows medial compartment narrowing grade 4 patellofemoral joint disease grade 3  Severe osteoarthritis of the right knee she also has symptoms of spinal stenosis and degenerative disc disease lumbar she is a diabetic she has obesity  She's not a good surgical candidate    Assessment    Encounter Diagnoses  Name Primary?  Marland Kitchen  Left knee pain   . Primary osteoarthritis of left knee Yes        Plan    We injected her knee and one place her in a knee brace she needs a knee replacement but I doubt she can get tuned up enough to have it so she is in a difficult situation. I'll see her when she gets her knee brace and then when she needs an injection once or twice a year we can do  that but otherwise unless she can get medically tuned up she is not eligible for surgery       Arther Abbott 08/09/2015, 2:58 PM

## 2015-08-09 NOTE — Patient Instructions (Addendum)
We will call you when your knee brace comes in   Joint Injection Care After Refer to this sheet in the next few days. These instructions provide you with information on caring for yourself after you have had a joint injection. Your caregiver also may give you more specific instructions. Your treatment has been planned according to current medical practices, but problems sometimes occur. Call your caregiver if you have any problems or questions after your procedure. After any type of joint injection, it is not uncommon to experience:  Soreness, swelling, or bruising around the injection site.  Mild numbness, tingling, or weakness around the injection site caused by the numbing medicine used before or with the injection. It also is possible to experience the following effects associated with the specific agent after injection:  Iodine-based contrast agents:  Allergic reaction (itching, hives, widespread redness, and swelling beyond the injection site).  Corticosteroids (These effects are rare.):  Allergic reaction.  Increased blood sugar levels (If you have diabetes and you notice that your blood sugar levels have increased, notify your caregiver).  Increased blood pressure levels.  Mood swings.  Hyaluronic acid in the use of viscosupplementation.  Temporary heat or redness.  Temporary rash and itching.  Increased fluid accumulation in the injected joint. These effects all should resolve within a day after your procedure.  HOME CARE INSTRUCTIONS  Limit yourself to light activity the day of your procedure. Avoid lifting heavy objects, bending, stooping, or twisting.  Take prescription or over-the-counter pain medication as directed by your caregiver.  You may apply ice to your injection site to reduce pain and swelling the day of your procedure. Ice may be applied 03-04 times:  Put ice in a plastic bag.  Place a towel between your skin and the bag.  Leave the ice on for  no longer than 15-20 minutes each time. SEEK IMMEDIATE MEDICAL CARE IF:   Pain and swelling get worse rather than better or extend beyond the injection site.  Numbness does not go away.  Blood or fluid continues to leak from the injection site.  You have chest pain.  You have swelling of your face or tongue.  You have trouble breathing or you become dizzy.  You develop a fever, chills, or severe tenderness at the injection site that last longer than 1 day. MAKE SURE YOU:  Understand these instructions.  Watch your condition.  Get help right away if you are not doing well or if you get worse. Document Released: 08/30/2011 Document Revised: 03/10/2012 Document Reviewed: 08/30/2011 [facility] Patient Information 2015 [place], [place]. This information is not intended to replace advice given to you by your health care provider. Make sure you discuss any questions you have with your health care provider.

## 2015-08-15 ENCOUNTER — Ambulatory Visit: Payer: Medicare Other | Admitting: Family Medicine

## 2015-08-18 ENCOUNTER — Other Ambulatory Visit: Payer: Self-pay | Admitting: Family Medicine

## 2015-08-25 ENCOUNTER — Telehealth: Payer: Self-pay | Admitting: *Deleted

## 2015-08-25 ENCOUNTER — Encounter: Payer: Self-pay | Admitting: Family Medicine

## 2015-08-25 ENCOUNTER — Ambulatory Visit (INDEPENDENT_AMBULATORY_CARE_PROVIDER_SITE_OTHER): Payer: Medicare Other | Admitting: Family Medicine

## 2015-08-25 VITALS — BP 164/80 | HR 67 | Resp 16 | Ht 66.0 in | Wt 231.0 lb

## 2015-08-25 DIAGNOSIS — E1129 Type 2 diabetes mellitus with other diabetic kidney complication: Secondary | ICD-10-CM

## 2015-08-25 DIAGNOSIS — I10 Essential (primary) hypertension: Secondary | ICD-10-CM

## 2015-08-25 DIAGNOSIS — E039 Hypothyroidism, unspecified: Secondary | ICD-10-CM

## 2015-08-25 DIAGNOSIS — IMO0002 Reserved for concepts with insufficient information to code with codable children: Secondary | ICD-10-CM

## 2015-08-25 DIAGNOSIS — R7301 Impaired fasting glucose: Secondary | ICD-10-CM | POA: Diagnosis not present

## 2015-08-25 DIAGNOSIS — M5440 Lumbago with sciatica, unspecified side: Secondary | ICD-10-CM

## 2015-08-25 DIAGNOSIS — F418 Other specified anxiety disorders: Secondary | ICD-10-CM

## 2015-08-25 DIAGNOSIS — E1165 Type 2 diabetes mellitus with hyperglycemia: Secondary | ICD-10-CM | POA: Diagnosis not present

## 2015-08-25 DIAGNOSIS — E1121 Type 2 diabetes mellitus with diabetic nephropathy: Secondary | ICD-10-CM

## 2015-08-25 DIAGNOSIS — E785 Hyperlipidemia, unspecified: Secondary | ICD-10-CM | POA: Diagnosis not present

## 2015-08-25 DIAGNOSIS — G7000 Myasthenia gravis without (acute) exacerbation: Secondary | ICD-10-CM

## 2015-08-25 DIAGNOSIS — M25561 Pain in right knee: Secondary | ICD-10-CM

## 2015-08-25 LAB — COMPLETE METABOLIC PANEL WITH GFR
ALT: 10 U/L (ref 6–29)
AST: 9 U/L — AB (ref 10–35)
Albumin: 3.7 g/dL (ref 3.6–5.1)
Alkaline Phosphatase: 160 U/L — ABNORMAL HIGH (ref 33–130)
BUN: 17 mg/dL (ref 7–25)
CALCIUM: 9 mg/dL (ref 8.6–10.4)
CHLORIDE: 105 mmol/L (ref 98–110)
CO2: 26 mmol/L (ref 20–31)
Creat: 0.77 mg/dL (ref 0.60–0.93)
GFR, Est African American: 89 mL/min (ref >=60)
GFR, Est Non African American: 77 mL/min (ref >=60)
Glucose, Bld: 96 mg/dL (ref 65–99)
POTASSIUM: 4.7 mmol/L (ref 3.5–5.3)
SODIUM: 142 mmol/L (ref 135–146)
Total Bilirubin: 0.3 mg/dL (ref 0.2–1.2)
Total Protein: 6.5 g/dL (ref 6.1–8.1)

## 2015-08-25 LAB — CBC WITH DIFFERENTIAL/PLATELET
BASOS ABS: 0 10*3/uL (ref 0.0–0.1)
BASOS PCT: 0 % (ref 0–1)
EOS ABS: 0.1 10*3/uL (ref 0.0–0.7)
Eosinophils Relative: 1 % (ref 0–5)
HCT: 29.4 % — ABNORMAL LOW (ref 36.0–46.0)
Hemoglobin: 9.5 g/dL — ABNORMAL LOW (ref 12.0–15.0)
LYMPHS ABS: 1.3 10*3/uL (ref 0.7–4.0)
Lymphocytes Relative: 12 % (ref 12–46)
MCH: 26.4 pg (ref 26.0–34.0)
MCHC: 32.3 g/dL (ref 30.0–36.0)
MCV: 81.7 fL (ref 78.0–100.0)
MPV: 10.6 fL (ref 8.6–12.4)
Monocytes Absolute: 0.7 10*3/uL (ref 0.1–1.0)
Monocytes Relative: 6 % (ref 3–12)
NEUTROS ABS: 8.8 10*3/uL — AB (ref 1.7–7.7)
NEUTROS PCT: 81 % — AB (ref 43–77)
PLATELETS: 317 10*3/uL (ref 150–400)
RBC: 3.6 MIL/uL — ABNORMAL LOW (ref 3.87–5.11)
RDW: 20.6 % — ABNORMAL HIGH (ref 11.5–15.5)
WBC: 10.9 10*3/uL — ABNORMAL HIGH (ref 4.0–10.5)

## 2015-08-25 LAB — TSH: TSH: 1.148 u[IU]/mL (ref 0.350–4.500)

## 2015-08-25 LAB — GLUCOSE, POCT (MANUAL RESULT ENTRY): POC Glucose: 96 mg/dl (ref 70–99)

## 2015-08-25 LAB — LIPID PANEL
CHOL/HDL RATIO: 2.9 ratio (ref <=5.0)
CHOLESTEROL: 143 mg/dL (ref 125–200)
HDL: 50 mg/dL (ref >=46)
LDL Cholesterol: 79 mg/dL (ref <130)
Triglycerides: 68 mg/dL (ref <150)
VLDL: 14 mg/dL (ref <30)

## 2015-08-25 LAB — HEMOGLOBIN A1C
HEMOGLOBIN A1C: 7.4 % — AB (ref <5.7)
Mean Plasma Glucose: 166 mg/dL — ABNORMAL HIGH (ref <117)

## 2015-08-25 NOTE — Telephone Encounter (Signed)
Pt called LMOM stating she does not take her medications everyday like she is supposed too, starting today she is going to take everything like she is supposed too, pt said she will come in the morning or Monday to have it checked.

## 2015-08-25 NOTE — Telephone Encounter (Signed)
Noted  

## 2015-08-25 NOTE — Patient Instructions (Addendum)
F/u  For nurse blood pressure check in 2 week. And flu vaccine  Return with medication today, for nurse to specifically review your bP meds sot that you can get this controlled   I need to verify with pharmacy what you have taking  For BP which is too high   Blood sugar looks good   Labs today lipid, cmp and EGFr, HBA1C, cbc, TSH   MD f/u in 3 month

## 2015-08-26 ENCOUNTER — Other Ambulatory Visit: Payer: Self-pay

## 2015-08-26 MED ORDER — HYDROCODONE-ACETAMINOPHEN 10-325 MG PO TABS: ORAL_TABLET | ORAL | Status: DC

## 2015-08-27 NOTE — Assessment & Plan Note (Signed)
Uncontrolled, pt to return with her medication so that situation can be clarified since she appears to be confused about her medicatipons DASH diet and commitment to daily physical activity for a minimum of 30 minutes discussed and encouraged, as a part of hypertension management. The importance of attaining a healthy weight is also discussed.  BP/Weight 08/25/2015 08/09/2015 06/20/2015 01/26/2015 11/24/2014 11/16/2014 91/09/1659  Systolic BP 600 459 977 414 239 532 023  Diastolic BP 80 81 72 60 78 78 64  Wt. (Lbs) 231 233 236.12 239.12 235 225.12 237.6  BMI 37.3 37.63 38.13 38.61 37.95 36.35 38.37

## 2015-08-27 NOTE — Progress Notes (Signed)
[name]     MRN: [id number]      DOB: [birth year]   HPI [name] is here for follow up and re-evaluation of chronic medical conditions, medication management and review of any available recent lab and radiology data.  Preventive health is updated, specifically  Cancer screening and Immunization.   Questions or concerns regarding consultations or procedures which the PT has had in the interim are  addressed. The PT denies any adverse reactions to current medications since the last visit.  There are no new concerns.  There are no specific complaints   ROS Denies recent fever or chills. Denies sinus pressure, nasal congestion, ear pain or sore throat. Denies chest congestion, productive cough or wheezing. Denies chest pains, palpitations and leg swelling Denies abdominal pain, nausea, vomiting,diarrhea or constipation.   Denies dysuria, frequency, hesitancy or incontinence. Denies joint pain, swelling and limitation in mobility. Denies headaches, seizures, numbness, or tingling. Denies depression, anxiety or insomnia. Denies skin break down or rash.   PE  BP 164/80 mmHg  Pulse 67  Resp 16  Ht 5\' 6"  (1.676 m)  Wt 231 lb (104.781 kg)  BMI 37.30 kg/m2  SpO2 97%  Patient alert and oriented and in no cardiopulmonary distress.  HEENT: No facial asymmetry, EOMI,   oropharynx pink and moist.  Neck supple no JVD, no mass.  Chest: Clear to auscultation bilaterally.  CVS: S1, S2 no murmurs, no S3.Regular rate.  ABD: Soft non tender.   Ext: No edema  MS: Adequate ROM spine, shoulders, hips and knees.  Skin: Intact, no ulcerations or rash noted.  Psych: Good eye contact, normal affect. Memory intact not anxious or depressed appearing.  CNS: CN 2-12 intact, power,  normal throughout.no focal deficits noted.   Assessment & Plan   Essential hypertension, benign Uncontrolled, pt to return with her medication so that situation can be clarified since she appears to  be confused about her medicatipons DASH diet and commitment to daily physical activity for a minimum of 30 minutes discussed and encouraged, as a part of hypertension management. The importance of attaining a healthy weight is also discussed.  BP/Weight 08/25/2015 08/09/2015 06/20/2015 01/26/2015 11/24/2014 11/16/2014 97/08/8920  Systolic BP 194 174 081 448 185 631 497  Diastolic BP 80 81 72 60 78 78 64  Wt. 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 [name] is reminded of the importance of commitment to daily physical activity for 30 minutes or more, as able and the need to limit carbohydrate intake to 30 to 60 grams per meal to help with blood sugar control.   The need to take medication as prescribed, test blood sugar as directed, and to call between visits if there is a concern that blood sugar is uncontrolled is also discussed.   [name] is reminded of the importance of daily foot exam, annual eye examination, and good blood sugar, blood pressure and cholesterol control.  Diabetic Labs Latest Ref Rng 08/25/2015 05/18/2015 01/26/2015 08/26/2014 04/15/2014  HbA1c <5.7 % 7.4(H) 8.2(H) 7.8(H) 7.5(H) 7.7(H)  Microalbumin 0.00 - 1.89 mg/dL - - - 26.36(H) -  Micro/Creat Ratio 0.0 - 30.0 mg/g - - - 167.5(H) -  Chol 125 - 200 mg/dL 143 262(H) 196 166 178  HDL >=46 mg/dL 50 50 55 53 57  Calc LDL <130 mg/dL 79 187(H) 118(H) 93  100(H)  Triglycerides <150 mg/dL 68 123 117 101 105  Creatinine 0.60 - 0.93 mg/dL 0.77 0.97 0.86 0.76 0.90   BP/Weight 08/25/2015 08/09/2015 06/20/2015 01/26/2015 11/24/2014 11/16/2014 54/0/0867  Systolic BP 619 509 326 712 458 099 833  Diastolic BP 80 81 72 60 78 78 64  Wt. (Lbs) 231 233 236.12 239.12 235 225.12 237.6  BMI 37.3 37.63 38.13 38.61 37.95 36.35 38.37   Foot/eye exam completion dates Latest Ref Rng 09/14/2014 08/26/2014  Eye Exam No  Retinopathy No Retinopathy -  Foot Form Completion - - Done         Morbid obesity Improved. Patient re-educated about  the importance of commitment to a  minimum of 150 minutes of exercise per week.  The importance of healthy food choices with portion control discussed. Encouraged to start a food diary, count calories and to consider  joining a support group. Sample diet sheets offered. Goals set by the patient for the next several months.   Weight /BMI 08/25/2015 08/09/2015 06/20/2015  WEIGHT 231 lb 233 lb 236 lb 1.9 oz  HEIGHT 5\' 6"  5\' 6"  5\' 6"   BMI 37.3 kg/m2 37.63 kg/m2 38.13 kg/m2    Current exercise per week 90 minutes.   Depression with anxiety Controlled, no change in medication   Hyperlipemia Hyperlipidemia:Low fat diet discussed and encouraged. Controlled, no change in medication    Lipid Panel  Lab Results  Component Value Date   CHOL 143 08/25/2015   HDL 50 08/25/2015   LDLCALC 79 08/25/2015   TRIG 68 08/25/2015   CHOLHDL 2.9 08/25/2015        KNEE PAIN Reports that replacement is planned for right knee in the near future, I have encouraged her to follow through on this, and have advised that rehab at SNF is a great option  Backache Controlled, no change in medication   Myasthenia gravis Stable

## 2015-08-27 NOTE — Assessment & Plan Note (Signed)
Reports that replacement is planned for right knee in the near future, I have encouraged her to follow through on this, and have advised that rehab at SNF is a great option

## 2015-08-27 NOTE — Assessment & Plan Note (Signed)
Controlled, no change in medication  

## 2015-08-27 NOTE — Assessment & Plan Note (Signed)
Improved [name] is reminded of the importance of commitment to daily physical activity for 30 minutes or more, as able and the need to limit carbohydrate intake to 30 to 60 grams per meal to help with blood sugar control.   The need to take medication as prescribed, test blood sugar as directed, and to call between visits if there is a concern that blood sugar is uncontrolled is also discussed.   [name] is reminded of the importance of daily foot exam, annual eye examination, and good blood sugar, blood pressure and cholesterol control.  Diabetic Labs Latest Ref Rng 08/25/2015 05/18/2015 01/26/2015 08/26/2014 04/15/2014  HbA1c <5.7 % 7.4(H) 8.2(H) 7.8(H) 7.5(H) 7.7(H)  Microalbumin 0.00 - 1.89 mg/dL - - - 26.36(H) -  Micro/Creat Ratio 0.0 - 30.0 mg/g - - - 167.5(H) -  Chol 125 - 200 mg/dL 143 262(H) 196 166 178  HDL >=46 mg/dL 50 50 55 53 57  Calc LDL <130 mg/dL 79 187(H) 118(H) 93 100(H)  Triglycerides <150 mg/dL 68 123 117 101 105  Creatinine 0.60 - 0.93 mg/dL 0.77 0.97 0.86 0.76 0.90   BP/Weight 08/25/2015 08/09/2015 06/20/2015 01/26/2015 11/24/2014 11/16/2014 53/06/4679  Systolic BP 321 224 825 003 704 888 916  Diastolic BP 80 81 72 60 78 78 64  Wt. (Lbs) 231 233 236.12 239.12 235 225.12 237.6  BMI 37.3 37.63 38.13 38.61 37.95 36.35 38.37   Foot/eye exam completion dates Latest Ref Rng 09/14/2014 08/26/2014  Eye Exam No Retinopathy No Retinopathy -  Foot Form Completion - - Done

## 2015-08-27 NOTE — Assessment & Plan Note (Signed)
Stable

## 2015-08-27 NOTE — Assessment & Plan Note (Signed)
Hyperlipidemia:Low fat diet discussed and encouraged. Controlled, no change in medication    Lipid Panel  Lab Results  Component Value Date   CHOL 143 08/25/2015   HDL 50 08/25/2015   LDLCALC 79 08/25/2015   TRIG 68 08/25/2015   CHOLHDL 2.9 08/25/2015

## 2015-08-27 NOTE — Assessment & Plan Note (Signed)
Improved. Patient re-educated about  the importance of commitment to a  minimum of 150 minutes of exercise per week.  The importance of healthy food choices with portion control discussed. Encouraged to start a food diary, count calories and to consider  joining a support group. Sample diet sheets offered. Goals set by the patient for the next several months.   Weight /BMI 08/25/2015 08/09/2015 06/20/2015  WEIGHT 231 lb 233 lb 236 lb 1.9 oz  HEIGHT 5\' 6"  5\' 6"  5\' 6"   BMI 37.3 kg/m2 37.63 kg/m2 38.13 kg/m2    Current exercise per week 90 minutes.

## 2015-09-02 ENCOUNTER — Other Ambulatory Visit: Payer: Self-pay | Admitting: Family Medicine

## 2015-09-07 ENCOUNTER — Ambulatory Visit (INDEPENDENT_AMBULATORY_CARE_PROVIDER_SITE_OTHER): Payer: Medicare Other

## 2015-09-07 VITALS — BP 120/58

## 2015-09-07 DIAGNOSIS — Z23 Encounter for immunization: Secondary | ICD-10-CM

## 2015-09-08 ENCOUNTER — Ambulatory Visit: Payer: Medicare Other | Admitting: Nutrition

## 2015-09-13 ENCOUNTER — Other Ambulatory Visit: Payer: Self-pay | Admitting: Family Medicine

## 2015-09-19 ENCOUNTER — Other Ambulatory Visit: Payer: Self-pay | Admitting: Family Medicine

## 2015-09-22 ENCOUNTER — Other Ambulatory Visit: Payer: Self-pay

## 2015-09-22 MED ORDER — HYDROCODONE-ACETAMINOPHEN 10-325 MG PO TABS: ORAL_TABLET | ORAL | Status: DC

## 2015-10-19 ENCOUNTER — Other Ambulatory Visit: Payer: Self-pay | Admitting: Family Medicine

## 2015-10-20 ENCOUNTER — Encounter: Payer: Medicare Other | Attending: Family Medicine | Admitting: Nutrition

## 2015-10-20 VITALS — Ht 66.0 in | Wt 229.0 lb

## 2015-10-20 DIAGNOSIS — E669 Obesity, unspecified: Secondary | ICD-10-CM | POA: Diagnosis not present

## 2015-10-20 DIAGNOSIS — Z713 Dietary counseling and surveillance: Secondary | ICD-10-CM | POA: Diagnosis not present

## 2015-10-20 DIAGNOSIS — Z794 Long term (current) use of insulin: Secondary | ICD-10-CM | POA: Insufficient documentation

## 2015-10-20 DIAGNOSIS — E1165 Type 2 diabetes mellitus with hyperglycemia: Secondary | ICD-10-CM | POA: Insufficient documentation

## 2015-10-20 DIAGNOSIS — IMO0002 Reserved for concepts with insufficient information to code with codable children: Secondary | ICD-10-CM

## 2015-10-20 DIAGNOSIS — Z6836 Body mass index (BMI) 36.0-36.9, adult: Secondary | ICD-10-CM | POA: Insufficient documentation

## 2015-10-20 DIAGNOSIS — E118 Type 2 diabetes mellitus with unspecified complications: Secondary | ICD-10-CM

## 2015-10-20 NOTE — Patient Instructions (Signed)
Goals 1.  Follow Plate Method 2. Eat three meals per day and don't skip meals. 3. Talk to Dr. Moshe Cipro about taking Lantus 4. Increase fresh fruits and vegetables. 5. Eat 2 -3 carb choices per meal. 6. Get A1C down to 7% in three months.

## 2015-10-20 NOTE — Progress Notes (Signed)
  Medical Nutrition Therapy:  Appt start time: 1400 end time:  1500.  Assessment:  Primary concerns today: Dm Type 2 . Hasnt been taking Lantus if her BS 150 or less.  Suppose to be taking 30 units of Lanuts daily, Metaglip and Tradgenta. She was afraid if she took the Lantus with her BS less than 150, she would have a low blood sugar in am. LIves by herself.  She  does the cooking at home. Most foods are baked and broiled and not fried. Uses the microwave a lot. Eats 2 meals per day. Forgot meter. FBS:  98-112 mg/dl. Didn't take her lantus last night.  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 Diet is inconsistent and inadequate to meet her nutritional needs. Usually skips lunch. Complains of low blood sugars sometimes.   Preferred Learning Style:   Auditory   Hands on  No preference indicated   Learning Readiness:  Ready  Change in progress   MEDICATIONS: See list.   DIETARY INTAKE:   24-hr recall:  B ( AM): Oatmeal OR toast with coffe. Snk ( AM): Nne  L ( PM): Skips. Snk ( PM):  D ( PM):  Baby kale, boiled potatoes, SF soda., Snk ( PM): none OR sugar free candy occassionally Beverages: Water, diet soda,  Usual physical activity: walks a little.  Estimated energy needs: 1500 calories 170 g carbohydrates 112 g protein 42 g fat  Progress Towards Goal(s):  In progress.   Nutritional Diagnosis:  NB-1.1 Food and nutrition-related knowledge deficit As related to Diabetes.  As evidenced by A1C 8.2%..    Intervention:   Nutrition and Diabetes education provided on My Plate, CHO counting, meal planning, portion sizes, timing of meals, avoiding snacks between meals unless having a low blood sugar, target ranges for A1C and blood sugars, signs/symptoms and treatment of hyper/hypoglycemia, monitoring blood sugars, taking medications  as prescribed, benefits of exercising 30 minutes per day and prevention of complications of DM.   Goals 1.  Follow Plate Method 2. Eat three meals per day and don't skip meals. 3. Talk to Dr. Moshe Cipro about taking Lantus 4. Increase fresh fruits and vegetables. 5. Eat 2 -3 carb choices per meal. 6. Get A1C down to 7% in three months.   Teaching Method Utilized:  Visual Auditory Hands on  Handouts given during visit include:  The Plate Method  Meal Plan Card  Diabetes Instructions  Barriers to learning/adherence to lifestyle change: Visually impaired.  Demonstrated degree of understanding via:  Teach Back   Monitoring/Evaluation:  Dietary intake, exercise, meal planning, SBG and body weight in 1 month(s). She may need adjustment in her insulin due to WNL blood sugars in am without taking lantus at night most of the time. She takes Lantus occasionally.  Needs followup on using insulin within 30 days and not trying to use expired insulin.

## 2015-10-21 ENCOUNTER — Encounter: Payer: Self-pay | Admitting: Family Medicine

## 2015-10-21 ENCOUNTER — Other Ambulatory Visit: Payer: Self-pay | Admitting: Family Medicine

## 2015-10-28 ENCOUNTER — Other Ambulatory Visit: Payer: Self-pay

## 2015-10-28 DIAGNOSIS — H401132 Primary open-angle glaucoma, bilateral, moderate stage: Secondary | ICD-10-CM | POA: Diagnosis not present

## 2015-10-28 DIAGNOSIS — E113312 Type 2 diabetes mellitus with moderate nonproliferative diabetic retinopathy with macular edema, left eye: Secondary | ICD-10-CM | POA: Diagnosis not present

## 2015-10-28 DIAGNOSIS — H35371 Puckering of macula, right eye: Secondary | ICD-10-CM | POA: Diagnosis not present

## 2015-10-28 MED ORDER — HYDROCODONE-ACETAMINOPHEN 10-325 MG PO TABS: ORAL_TABLET | ORAL | Status: DC

## 2015-10-31 ENCOUNTER — Telehealth: Payer: Self-pay | Admitting: Family Medicine

## 2015-10-31 ENCOUNTER — Telehealth: Payer: Self-pay | Admitting: Nutrition

## 2015-10-31 DIAGNOSIS — E1165 Type 2 diabetes mellitus with hyperglycemia: Principal | ICD-10-CM

## 2015-10-31 DIAGNOSIS — IMO0002 Reserved for concepts with insufficient information to code with codable children: Secondary | ICD-10-CM

## 2015-10-31 DIAGNOSIS — E1121 Type 2 diabetes mellitus with diabetic nephropathy: Secondary | ICD-10-CM

## 2015-10-31 NOTE — Telephone Encounter (Signed)
Noted , pls see myuy previous msg and discuss with pt I agree with lower dose of insulin with falling blood sugars

## 2015-10-31 NOTE — Telephone Encounter (Signed)
PT. Called and noted that she has been having quite a bit of low blood sugars. FBS are in the 60's. Was taking 30 units of her Lantus and also on Metaglip.  She notes she has had to cut back on her lantus due to feeling scared with the low blood sugars. Encouraged her to try the [employer] or Smart One meals for better balanced meals. Encouraged her to call Dr. Buena Irish office to discuss reducing her lantus does due to low blood sugars. Would recommend to discontinue Metaglip and just offer Metformin since she is on insulin.

## 2015-10-31 NOTE — Telephone Encounter (Signed)
reduse lantus to 25  Units, call next week if still having lows , will advise to reduce to 20 units, ensure HBA1C done 11/24

## 2015-10-31 NOTE — Addendum Note (Signed)
Addended by: Eual Fines on: 10/31/2015 04:21 PM   Modules accepted: Orders

## 2015-10-31 NOTE — Telephone Encounter (Signed)
Patient is calling to let Dr. Moshe Cipro know that her blood sugar is running about 61 to 78 in the mornings and 100 to 102 in the evenings and she dropped her insulin to 15 units

## 2015-10-31 NOTE — Telephone Encounter (Signed)
Please advise 

## 2015-11-01 NOTE — Telephone Encounter (Signed)
Pt aware.

## 2015-11-01 NOTE — Telephone Encounter (Signed)
Patient aware.

## 2015-11-07 ENCOUNTER — Ambulatory Visit (INDEPENDENT_AMBULATORY_CARE_PROVIDER_SITE_OTHER): Payer: Medicare Other | Admitting: Ophthalmology

## 2015-11-16 ENCOUNTER — Other Ambulatory Visit: Payer: Self-pay | Admitting: Family Medicine

## 2015-11-16 ENCOUNTER — Telehealth: Payer: Self-pay | Admitting: Nutrition

## 2015-11-16 DIAGNOSIS — E1165 Type 2 diabetes mellitus with hyperglycemia: Principal | ICD-10-CM

## 2015-11-16 DIAGNOSIS — D649 Anemia, unspecified: Secondary | ICD-10-CM | POA: Diagnosis not present

## 2015-11-16 DIAGNOSIS — I10 Essential (primary) hypertension: Secondary | ICD-10-CM | POA: Diagnosis not present

## 2015-11-16 DIAGNOSIS — E1121 Type 2 diabetes mellitus with diabetic nephropathy: Secondary | ICD-10-CM | POA: Diagnosis not present

## 2015-11-16 DIAGNOSIS — IMO0002 Reserved for concepts with insufficient information to code with codable children: Secondary | ICD-10-CM

## 2015-11-16 LAB — CBC
HCT: 28 % — ABNORMAL LOW (ref 36.0–46.0)
Hemoglobin: 8.9 g/dL — ABNORMAL LOW (ref 12.0–15.0)
MCH: 27.4 pg (ref 26.0–34.0)
MCHC: 31.8 g/dL (ref 30.0–36.0)
MCV: 86.2 fL (ref 78.0–100.0)
MPV: 10.3 fL (ref 8.6–12.4)
Platelets: 364 10*3/uL (ref 150–400)
RBC: 3.25 MIL/uL — ABNORMAL LOW (ref 3.87–5.11)
RDW: 17.1 % — AB (ref 11.5–15.5)
WBC: 8.8 10*3/uL (ref 4.0–10.5)

## 2015-11-16 LAB — COMPLETE METABOLIC PANEL WITH GFR
ALBUMIN: 3.6 g/dL (ref 3.6–5.1)
ALK PHOS: 135 U/L — AB (ref 33–130)
ALT: 10 U/L (ref 6–29)
AST: 11 U/L (ref 10–35)
BUN: 17 mg/dL (ref 7–25)
CALCIUM: 9 mg/dL (ref 8.6–10.4)
CO2: 25 mmol/L (ref 20–31)
Chloride: 106 mmol/L (ref 98–110)
Creat: 0.99 mg/dL — ABNORMAL HIGH (ref 0.60–0.93)
GFR, EST NON AFRICAN AMERICAN: 57 mL/min — AB (ref >=60)
GFR, Est African American: 66 mL/min (ref >=60)
Glucose, Bld: 167 mg/dL — ABNORMAL HIGH (ref 65–99)
POTASSIUM: 5.1 mmol/L (ref 3.5–5.3)
SODIUM: 141 mmol/L (ref 135–146)
Total Bilirubin: 0.3 mg/dL (ref 0.2–1.2)
Total Protein: 6.5 g/dL (ref 6.1–8.1)

## 2015-11-16 LAB — HEMOGLOBIN A1C
Hgb A1c MFr Bld: 6.8 % — ABNORMAL HIGH (ref <5.7)
Mean Plasma Glucose: 148 mg/dL — ABNORMAL HIGH (ref <117)

## 2015-11-16 LAB — FERRITIN: FERRITIN: 8 ng/mL — AB (ref 10–291)

## 2015-11-16 LAB — IRON: Iron: 34 ug/dL — ABNORMAL LOW (ref 45–160)

## 2015-11-16 NOTE — Telephone Encounter (Signed)
Labs ordered.  Mailed info on checking blood sugars.

## 2015-11-16 NOTE — Telephone Encounter (Addendum)
Spoke with pt, states she wakes up too late for breakfast, does not cgheck bedtime sugars, has no appt here Pls order HBA1C, chem 7 and EGFR, CBC , iron and ferritin, microalb, and fax to lab , she is going there this am

## 2015-11-16 NOTE — Telephone Encounter (Signed)
Pt called me to reschedule her missed appt. She notes she is having blood sugars 40-60's in the am. She notes she is taking 25 units of Lantus (?) at night. She is going to call your office now to get recommendations for insulin adjustment. I have reviewed treatment of low blood sugars and risks. Please call her if she hasn't called within the next 30 minutes.

## 2015-11-16 NOTE — Addendum Note (Signed)
Addended by: Denman George B on: 11/16/2015 09:20 AM   Modules accepted: Orders

## 2015-11-17 ENCOUNTER — Ambulatory Visit: Payer: Medicare Other | Admitting: Nutrition

## 2015-11-17 ENCOUNTER — Other Ambulatory Visit: Payer: Self-pay | Admitting: Family Medicine

## 2015-11-17 DIAGNOSIS — D509 Iron deficiency anemia, unspecified: Secondary | ICD-10-CM

## 2015-11-17 LAB — MICROALBUMIN / CREATININE URINE RATIO
CREATININE, URINE: 236 mg/dL (ref 20–320)
MICROALB/CREAT RATIO: 17 ug/mg{creat} (ref <30)
Microalb, Ur: 4 mg/dL

## 2015-11-18 ENCOUNTER — Other Ambulatory Visit: Payer: Self-pay

## 2015-11-18 ENCOUNTER — Other Ambulatory Visit: Payer: Self-pay | Admitting: Family Medicine

## 2015-11-18 MED ORDER — GLIPIZIDE-METFORMIN HCL 2.5-500 MG PO TABS: ORAL_TABLET | ORAL | Status: DC

## 2015-11-28 ENCOUNTER — Other Ambulatory Visit: Payer: Self-pay

## 2015-11-28 MED ORDER — HYDROCODONE-ACETAMINOPHEN 10-325 MG PO TABS: ORAL_TABLET | ORAL | Status: DC

## 2015-11-28 NOTE — Telephone Encounter (Signed)
VM to call and reschedule appt. PC

## 2015-11-30 ENCOUNTER — Other Ambulatory Visit: Payer: Self-pay | Admitting: Family Medicine

## 2015-11-30 ENCOUNTER — Encounter (HOSPITAL_COMMUNITY): Payer: Medicare Other | Attending: Hematology & Oncology | Admitting: Hematology & Oncology

## 2015-11-30 ENCOUNTER — Encounter (HOSPITAL_COMMUNITY): Payer: Self-pay | Admitting: Hematology & Oncology

## 2015-11-30 VITALS — BP 159/57 | HR 66 | Temp 99.3°F | Resp 18 | Wt 224.8 lb

## 2015-11-30 DIAGNOSIS — D649 Anemia, unspecified: Secondary | ICD-10-CM | POA: Insufficient documentation

## 2015-11-30 DIAGNOSIS — D509 Iron deficiency anemia, unspecified: Secondary | ICD-10-CM | POA: Diagnosis not present

## 2015-11-30 LAB — CBC WITH DIFFERENTIAL/PLATELET
BASOS PCT: 0 %
Basophils Absolute: 0 10*3/uL (ref 0.0–0.1)
EOS ABS: 0.1 10*3/uL (ref 0.0–0.7)
EOS PCT: 1 %
HCT: 29.7 % — ABNORMAL LOW (ref 36.0–46.0)
Hemoglobin: 9.5 g/dL — ABNORMAL LOW (ref 12.0–15.0)
Lymphocytes Relative: 14 %
Lymphs Abs: 1.5 10*3/uL (ref 0.7–4.0)
MCH: 27.9 pg (ref 26.0–34.0)
MCHC: 32 g/dL (ref 30.0–36.0)
MCV: 87.1 fL (ref 78.0–100.0)
MONO ABS: 0.8 10*3/uL (ref 0.1–1.0)
Monocytes Relative: 8 %
Neutro Abs: 8.3 10*3/uL — ABNORMAL HIGH (ref 1.7–7.7)
Neutrophils Relative %: 77 %
PLATELETS: 423 10*3/uL — AB (ref 150–400)
RBC: 3.41 MIL/uL — ABNORMAL LOW (ref 3.87–5.11)
RDW: 16.8 % — AB (ref 11.5–15.5)
WBC: 10.8 10*3/uL — ABNORMAL HIGH (ref 4.0–10.5)

## 2015-11-30 LAB — FERRITIN: Ferritin: 7 ng/mL — ABNORMAL LOW (ref 11–307)

## 2015-11-30 LAB — COMPREHENSIVE METABOLIC PANEL
ALBUMIN: 3.9 g/dL (ref 3.5–5.0)
ALT: 12 U/L — ABNORMAL LOW (ref 14–54)
AST: 14 U/L — AB (ref 15–41)
Alkaline Phosphatase: 138 U/L — ABNORMAL HIGH (ref 38–126)
Anion gap: 8 (ref 5–15)
BUN: 32 mg/dL — AB (ref 6–20)
CHLORIDE: 104 mmol/L (ref 101–111)
CO2: 25 mmol/L (ref 22–32)
Calcium: 9.4 mg/dL (ref 8.9–10.3)
Creatinine, Ser: 1.41 mg/dL — ABNORMAL HIGH (ref 0.44–1.00)
GFR calc Af Amer: 42 mL/min — ABNORMAL LOW (ref >60)
GFR calc non Af Amer: 36 mL/min — ABNORMAL LOW (ref >60)
GLUCOSE: 60 mg/dL — AB (ref 65–99)
POTASSIUM: 3.9 mmol/L (ref 3.5–5.1)
Sodium: 137 mmol/L (ref 135–145)
Total Bilirubin: 0.4 mg/dL (ref 0.3–1.2)
Total Protein: 7.8 g/dL (ref 6.5–8.1)

## 2015-11-30 LAB — RETICULOCYTES
RBC.: 3.42 MIL/uL — AB (ref 3.87–5.11)
RETIC CT PCT: 1.5 % (ref 0.4–3.1)
Retic Count, Absolute: 51.3 10*3/uL (ref 19.0–186.0)

## 2015-11-30 LAB — IRON AND TIBC
IRON: 54 ug/dL (ref 28–170)
Saturation Ratios: 12 % (ref 10.4–31.8)
TIBC: 449 ug/dL (ref 250–450)
UIBC: 395 ug/dL

## 2015-11-30 LAB — FOLATE: FOLATE: 9.4 ng/mL (ref >5.9)

## 2015-11-30 LAB — SEDIMENTATION RATE: Sed Rate: 52 mm/hr — ABNORMAL HIGH (ref 0–22)

## 2015-11-30 LAB — VITAMIN B12: VITAMIN B 12: 338 pg/mL (ref 180–914)

## 2015-11-30 LAB — LACTATE DEHYDROGENASE: LDH: 208 U/L — ABNORMAL HIGH (ref 98–192)

## 2015-11-30 NOTE — Progress Notes (Signed)
..  [name]'s reason for visit today is for labs as scheduled per MD orders.  Venipuncture performed with a 23 gauge butterfly needle to L Antecubital.  [name] tolerated procedure well and without incident; questions were answered and patient was discharged.

## 2015-11-30 NOTE — Patient Instructions (Addendum)
..  [place] at [facility] Discharge Instructions  RECOMMENDATIONS MADE BY THE CONSULTANT AND ANY TEST RESULTS WILL BE SENT TO YOUR REFERRING PHYSICIAN.  Exam per Dr. Whitney Muse  We will set you up for 2 doses of iron. They will be a week a part  Return in  1 month  Thank you for choosing [place] at [facility] to provide your oncology and hematology care.  To afford each patient quality time with our provider, please arrive at least 15 minutes before your scheduled appointment time.    You need to re-schedule your appointment should you arrive 10 or more minutes late.  We strive to give you quality time with our providers, and arriving late affects you and other patients whose appointments are after yours.  Also, if you no show three or more times for appointments you may be dismissed from the clinic at the providers discretion.     Again, thank you for choosing [facility].  Our hope is that these requests will decrease the amount of time that you wait before being seen by our physicians.       _____________________________________________________________  Should you have questions after your visit to [facility], please contact our office at [phone] between the hours of 8:30 a.m. and 4:30 p.m.  Voicemails left after 4:30 p.m. will not be returned until the following business day.  For prescription refill requests, have your pharmacy contact our office.   Iron Deficiency Anemia, Adult Anemia is when you have a low number of healthy red blood cells. It is often caused by too little iron. This is called iron deficiency anemia. It may make you tired and short of breath. HOME CARE   Take iron as told by your doctor.  Take vitamins as told by your doctor.  Eat foods that have iron in them. This includes liver, lean beef, whole-grain bread, eggs, dried fruit, and dark green leafy vegetables. GET HELP RIGHT AWAY  IF:  You pass out (faint).  You have chest pain.  You feel sick to your stomach (nauseous) or throw up (vomit).  You get very short of breath with activity.  You are weak.  You have a fast heartbeat.  You start to sweat for no reason.  You become light-headed when getting up from a chair or bed. MAKE SURE YOU:  Understand these instructions.  Will watch your condition.  Will get help right away if you are not doing well or get worse.   This information is not intended to replace advice given to you by your health care provider. Make sure you discuss any questions you have with your health care provider.   Document Released: 01/19/2011 Document Revised: 01/07/2015 Document Reviewed: 08/24/2013 Elsevier Interactive Patient Education [employer].

## 2015-11-30 NOTE — Progress Notes (Signed)
[facility] NOTE  Patient Care Team: Fayrene Helper, MD as PCP - General Daneil Dolin, MD (Gastroenterology) Marylynn Pearson, MD as Consulting Physician (Ophthalmology) Phillips Odor, MD as Consulting Physician (Neurology) Hayden Pedro, MD as Consulting Physician (Ophthalmology)  CHIEF COMPLAINTS/PURPOSE OF CONSULTATION:  Severe iron deficiency with serum ferritin of 8 ng/ml on 11/16/2015, 9 ng/ml on 01/26/2015 Hemoglobin of 8.9 g/dl on 11/16/2015 EGD on 09/16/2014 with Schatzki's ring and Hiatal hernia Colonoscopy on 05/08/2012 with internal and external hemorrhoids, colonic diverticulosis   HISTORY OF PRESENTING ILLNESS:  [name] 74 y.o. female is here because of severe iron deficiency. She is here alone today.  Ms. Pezza says she feels tired all the time, in her muscles.  "When they diagnosed me with myasthenia gravis they told me my bones would hurt, and I have chronic arthritis." She says "I hurt a lot," and confirms that sometimes she eats ice.  Ms. Mcgowin is ready for any kind of treatment that could give her some more energy and less pain. She says "I've been anemic all my life."  She says that all she does is sit because she has no energy.  Ms. Vicary has not noticed any blood in her stool, or any black, sticky, or tarry stool. She says she hardly eats any kind of meat these days.  She says she did the "test you send stool studies off in the mail" about a month of a month and a half ago, and it was normal. This was for her insurance screenings.  Ms. Chop has a little bit of diverticulosis, but nothing else on her colonoscopy, which was only 2 years ago.  She used to take iron pills, and they made her very constipated. She feels unable to take them. She is here today for other recommendations in regards to managing her anemia.    MEDICAL HISTORY:  Past Medical History  Diagnosis Date  . Hypertension   .  Tobacco abuse   . Myasthenia gravis   . Gastroesophageal reflux   . Thyroid disease     "used to take RX; they took me off it" (01/19/2014)  . Iron deficiency anemia   . Depression   . Hyperlipidemia   . Obesity   . Carpal tunnel syndrome   . Shoulder pain   . Impingement syndrome, shoulder   . Pulmonary HTN ([place])   . Mitral regurgitation   . Schatzki's ring     Last EGD w/ dilation 02/08/11, 2009 & 2007  . Esophagitis, erosive 2009  . Diverticulosis 07/2004    Colonscopy Dr Gala Romney  . Heart murmur     saw cardiology In [place], he told her she did not need to come back.  . Seasonal allergies   . Chronic bronchitis ([place])     "got it q yr for awhile; hasn't had it in awhile" (01/19/2014)  . Exertional shortness of breath   . OSA on CPAP   . Type II diabetes mellitus ([place])   . [id number])     "usually a couple times/wk" (01/19/2014)  . Arthritis     "all over" (01/19/2014)  . Anxiety   . Pneumonia 01/2012  . Myasthenia gravis in remission ([place]) 11/24/2014    SURGICAL HISTORY: Past Surgical History  Procedure Laterality Date  . Total knee arthroplasty Right 05/13/07    Dr. Aline Brochure  . Carpal tunnel release Bilateral   . Incontinence surgery  08/26/09    [place]  .  Cholecystectomy    . Abdominal hysterectomy    . Eye surgery    . Esophagogastroduodenoscopy  02/08/11    Rourk-Distal esophageal erosion consistent with mild erosive reflux   esophagitis/ Noncritical Schatzki ring, small hiatal hernia otherwise upper/ gastrointestinal tract appeared unremarkable, status post passage  of a Maloney dilation to biopsy disruption of the ring described  . Colonoscopy  05/08/2012    [id number] and external hemorrhoids; colonic diverticulosis  . Joint replacement      right total knee  . Appendectomy    . Tonsillectomy    . Cataract extraction w/phaco Left 10/21/2013    Procedure: LEFT CATARACT EXTRACTION PHACO AND INTRAOCULAR LENS PLACEMENT (IOC);  Surgeon: Marylynn Pearson, MD;   Location: [place];  Service: Ophthalmology;  Laterality: Left;  . 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 Esophageal dilation      "more than 3 times" (01/19/2014)  . 25 gauge pars plana vitrectomy with 20 gauge mvr port Left 01/19/2014    Procedure: 25 GAUGE PARS PLANA VITRECTOMY WITH 20 GAUGE MVR PORT; MEMBRAME PEEL; SERUM PATCH; LASER TREATMENT; C3F8;  Surgeon: Hayden Pedro, MD;  Location: [place];  Service: Ophthalmology;  Laterality: Left;  . Esophagogastroduodenoscopy  04/2012    2 tandem incomplete distal esophagea rings s/p dilation.   . Esophagogastroduodenoscopy N/A 09/16/2014    Procedure: ESOPHAGOGASTRODUODENOSCOPY (EGD);  Surgeon: Daneil Dolin, MD;  [facility];  Service: Endoscopy;  Laterality: N/A;  1200  . Savory dilation N/A 09/16/2014    Procedure: SAVORY DILATION;  Surgeon: Daneil Dolin, MD;  [facility];  Service: Endoscopy;  Laterality: N/A;  Venia Minks dilation N/A 09/16/2014    Procedure: Venia Minks DILATION;  Surgeon: Daneil Dolin, MD;  [facility];  Service: Endoscopy;  Laterality: N/A;    SOCIAL HISTORY: Social History   Social History  . Marital Status: Single    Spouse Name: N/A  . Number of Children: 2  . Years of Education: N/A   Occupational History  . disabled    Social History Main Topics  . Smoking status: Former Smoker -- 0.50 packs/day for 25 years    Types: Cigarettes    Quit date: 01/01/2004  . Smokeless tobacco: Never Used  . Alcohol Use: No  . Drug Use: No  . Sexual Activity: No   Other Topics Concern  . Not on file   Social History Narrative   Patient lives at home by herself   Patient drinks one cup of coffee a day   Patient is right handed.   She was born here in [place] on 10/31/1941 She is single; she has 2 children When asked if they're good, she said "I guess so." 4 grandsons; aged 55, 93, 70, 42.  She worked in 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 She never had any alcohol problems.  FAMILY HISTORY: Family History  Problem Relation Age of Onset  . Diabetes Son   . Hypertension Son   . Hypertension Daughter   . Cancer Mother   . Heart disease Mother   . Cancer Father   . Heart disease Father   . Colon cancer Neg Hx   . Diabetes Sister   .  Cancer Sister   . Kidney failure Sister    Her mother and father are both deceased Her mother died from cancer in her 93's; "she had a big mole on the side of her face." Her father died a long time ago, from blood pressure; stroke. "Blood pressure my daddy died from. Stroke."  Her oldest sister is 36, and Ajee's the baby Her oldest sister is a Secretary/administrator; Maudeen says "she's in better shape than I am to tell you the truth. I don't know how she do it." She says her sister still drives, makes cakes for people. There were 7 siblings total. None of the 5 that are gone died from cancer.  indicated that her mother is deceased. She indicated that her father is deceased. She indicated that only one of her three sisters is alive. She indicated that only one of her three brothers is alive.   ALLERGIES:  has No Known Allergies.  MEDICATIONS:  Current Outpatient Prescriptions  Medication Sig Dispense Refill  . amLODipine (NORVASC) 10 MG tablet TAKE 1 TABLET BY MOUTH ONCE A DAY. 30 tablet 1  . B-D ULTRAFINE III SHORT PEN 31G X 8 MM MISC USE ONCE DAILY WITH LANTUS SOLOSTAR PEN. 100 each 2  . brimonidine-timolol (COMBIGAN) 0.2-0.5 % ophthalmic solution Place 1 drop into both eyes every 12 (twelve) hours.    . cetirizine (ZYRTEC) 10 MG tablet TAKE ONE TABLET BY MOUTH DAILY. 30 tablet 2  . citalopram (CELEXA) 40 MG tablet TAKE ONE TABLET BY MOUTH DAILY. 30 tablet 0  . clonazePAM (KLONOPIN) 0.5 MG tablet TAKE  ONE TABLET BY MOUTH 3 TIMES DAILY AS NEEDED FOR ANXIETY. 90 tablet 2  . Cranberry 1000 MG CAPS Take 1,000 mg by mouth daily.     . CRESTOR 40 MG tablet TAKE (1) TABLET BY MOUTH AT BEDTIME. 30 tablet 3  . cycloSPORINE (RESTASIS) 0.05 % ophthalmic emulsion Place 1 drop into both eyes 2 (two) times daily.     Marland Kitchen diltiazem (CARDIZEM CD) 240 MG 24 hr capsule TAKE (1) CAPSULE BY MOUTH TWICE DAILY. 60 capsule 0  . diltiazem (DILACOR XR) 240 MG 24 hr capsule Take 240 mg by mouth 2 (two) times daily.    . Flaxseed, Linseed, (FLAXSEED OIL) 1000 MG CAPS Take 1,000 mg by mouth daily.    . fluticasone (FLONASE) 50 MCG/ACT nasal spray Place 2 sprays into both nostrils daily as needed for allergies or rhinitis. 16 g 2  . gabapentin (NEURONTIN) 300 MG capsule TAKE 1 CAPSULE BY MOUTH THREE TIMES A DAY. 90 capsule 0  . glipiZIDE-metformin (METAGLIP) 2.5-500 MG tablet TAKE 1 TABLETS BY MOUTH TWICE A DAY BEFORE MEALS. 60 tablet 3  . hydrALAZINE (APRESOLINE) 25 MG tablet Take 1 tablet (25 mg total) by mouth 3 (three) times daily. 90 tablet 3  . LANTUS SOLOSTAR 100 UNIT/ML Solostar Pen INJECT 30 UNITS SUBCUTANEOUSLY ONCE DAILY. 15 mL 2  . losartan (COZAAR) 100 MG tablet TAKE (1) TABLET BY MOUTH DAILY. 30 tablet 0  . Multiple Vitamin (MULTIVITAMIN WITH MINERALS) TABS tablet Take 1 tablet by mouth at bedtime.    Marland Kitchen omeprazole (PRILOSEC) 20 MG capsule TAKE ONE CAPSULE BY MOUTH DAILY. 30 capsule 0  . ondansetron (ZOFRAN) 4 MG tablet TAKE (1) TABLET BY MOUTH EVERY FOUR HOURS AS NEEDED FOR NAUSEA. 30 tablet 2  . oxybutynin (DITROPAN-XL) 10 MG 24 hr tablet TAKE 1 TABLET BY MOUTH DAILY. 30 tablet 0  . polyethylene glycol powder (GLYCOLAX/MIRALAX) powder Take 17 g by  mouth daily. 3350 g 1  . prednisoLONE acetate (PRED FORTE) 1 % ophthalmic suspension Place 1 drop into the left eye 4 (four) times daily. 5 mL 0  . predniSONE (DELTASONE) 5 MG tablet TAKE 1 TABLET BY MOUTH DAILY. 30 tablet 0  . PROAIR HFA 108 (90 BASE) MCG/ACT  inhaler INHALE 2 PUFFS BY MOUTH EVERY 6 HOURS AS NEEDED . 8.5 g 3  . promethazine (PHENERGAN) 25 MG tablet Take 25 mg by mouth daily as needed for nausea.    Marland Kitchen pyridostigmine (MESTINON) 60 MG tablet TAKE (1) TABLET BY MOUTH THREE TIMES A DAY. 90 tablet 3  . spironolactone (ALDACTONE) 25 MG tablet TAKE 1 TABLET BY MOUTH ONCE A DAY. 30 tablet 0  . temazepam (RESTORIL) 30 MG capsule Take 30 mg by mouth at bedtime as needed for sleep.    . TRADJENTA 5 MG TABS tablet TAKE 1 TABLET BY MOUTH ONCE A DAY. 30 tablet 0  . venlafaxine XR (EFFEXOR-XR) 150 MG 24 hr capsule TAKE 1 CAPSULE BY MOUTH DAILY. 30 capsule 0  . Vitamin D, Ergocalciferol, (DRISDOL) 50000 UNITS CAPS capsule TAKE 1 CAPSULE BY MOUTH ONCE A WEEK. 4 capsule 0  . hydrALAZINE (APRESOLINE) 25 MG tablet TAKE 1 TABLET BY MOUTH THREE TIMES A DAY. 90 tablet 3  . HYDROcodone-acetaminophen (NORCO) 10-325 MG tablet TAKE 1 TABLET BY MOUTH 3 TIMES DAILY. 90 tablet 0   No current facility-administered medications for this visit.    Review of Systems  Constitutional: Positive for malaise/fatigue.  HENT: Negative.   Eyes: Negative.   Respiratory: Negative.   Cardiovascular: Negative.   Gastrointestinal: Negative.   Genitourinary: Negative.   Musculoskeletal: Negative.   Skin: Negative.   Neurological: Positive for weakness. Negative for dizziness, tingling and tremors.  Endo/Heme/Allergies: Negative.   Psychiatric/Behavioral: Negative.   All other systems reviewed and are negative.  14 point ROS was done and is otherwise as detailed above or in HPI   PHYSICAL EXAMINATION: ECOG PERFORMANCE STATUS: 1 - Symptomatic but completely ambulatory  Filed Vitals:   11/30/15 1533  BP: 159/57  Pulse: 66  Temp: 99.3 F (37.4 C)  Resp: 18   Filed Weights   11/30/15 1533  Weight: 224 lb 12.8 oz (101.969 kg)    Physical Exam  Constitutional: She is oriented to person, place, and time and well-developed, well-nourished, and in no distress.    HENT:  Head: Normocephalic and atraumatic.  Nose: Nose normal.  Mouth/Throat: Oropharynx is clear and moist. No oropharyngeal exudate.  Eyes: Conjunctivae and EOM are normal. Pupils are equal, round, and reactive to light. Right eye exhibits no discharge. Left eye exhibits no discharge. No scleral icterus.  Neck: Normal range of motion. Neck supple. No tracheal deviation present. No thyromegaly present.  Cardiovascular: Normal rate, regular rhythm and normal heart sounds.  Exam reveals no gallop and no friction rub.   No murmur heard. Pulmonary/Chest: Effort normal and breath sounds normal. She has no wheezes. She has no rales.  Abdominal: Soft. Bowel sounds are normal. She exhibits no distension and no mass. There is no tenderness. There is no rebound and no guarding.  Musculoskeletal: Normal range of motion. She exhibits no edema.  Lymphadenopathy:    She has no cervical adenopathy.  Neurological: She is alert and oriented to person, place, and time. She has normal reflexes. No cranial nerve deficit. Gait normal. Coordination normal.  Skin: Skin is warm and dry. No rash noted.  Psychiatric: Mood, memory, affect and judgment normal.  Nursing note and vitals reviewed.   LABORATORY DATA:  I have reviewed the data as listed Lab Results  Component Value Date   WBC 8.8 11/16/2015   HGB 8.9* 11/16/2015   HCT 28.0* 11/16/2015   MCV 86.2 11/16/2015   PLT 364 11/16/2015   CMP     Component Value Date/Time   NA 141 11/16/2015 1201   K 5.1 11/16/2015 1201   CL 106 11/16/2015 1201   CO2 25 11/16/2015 1201   GLUCOSE 167* 11/16/2015 1201   BUN 17 11/16/2015 1201   CREATININE 0.99* 11/16/2015 1201   CREATININE 0.92 01/19/2014 0933   CALCIUM 9.0 11/16/2015 1201   PROT 6.5 11/16/2015 1201   ALBUMIN 3.6 11/16/2015 1201   AST 11 11/16/2015 1201   ALT 10 11/16/2015 1201   ALKPHOS 135* 11/16/2015 1201   BILITOT 0.3 11/16/2015 1201   GFRNONAA 57* 11/16/2015 1201   GFRNONAA 62* 01/19/2014  0933   GFRAA 66 11/16/2015 1201   GFRAA 71* 01/19/2014 0933   Results for SHEILYN, ORRISON (MRN [id number])   Ref. Range 10/07/2007 21:29 04/08/2012 16:20 01/26/2015 15:57 11/16/2015 12:01 11/30/2015 16:09  Ferritin Latest Ref Range: 11-307 ng/mL 13 8 (L) 9 (L) 8 (L) 7 (L)   ASSESSMENT & PLAN:  Severe iron deficiency with serum ferritin of 8 ng/ml on 11/16/2015, 9 ng/ml on 01/26/2015 Hemoglobin of 8.9 g/dl on 11/16/2015 EGD on 09/16/2014 with Schatzki's ring and Hiatal hernia Colonoscopy on 05/08/2012 with internal and external hemorrhoids, colonic diverticulosis  We discussed causes of anemia, in that they are broad. She has obvious iron deficiency as manifested by her ferritin level. In chart review this is long standing. She is however normocytic and may have a concurrent B12 or folate deficiency. We will order additional anemia labs today as well.  Iron deficit has been calculated and she is agreeable to IV iron. The most likely cause of her anemia is due to chronic blood loss/malabsorption syndrome.  We discussed some of the risks, benefits, and alternatives of intravenous iron infusions. The patient is symptomatic from anemia and the iron level is critically low. She tolerated oral iron supplement poorly and desires to achieved higher levels of iron faster for adequate hematopoesis. Some of the side-effects to be expected including risks of infusion reactions, phlebitis, headaches, nausea and fatigue.  The patient is willing to proceed. Patient education material was dispensed.  Goal is to keep ferritin level greater than or equal to 100 ng/ml  I will keep her apprised of other labs as they become available.  Once iron is adequately replaced if her counts do not completely normalize we can proceed with additional anemia evaluation. We will plan on seeing her back in several weeks with repeat CBC and ferritin. Orders Placed This Encounter  Procedures  . CBC with Differential  . Comprehensive  metabolic panel  . Kappa/lambda light chains  . IgG, IgA, IgM  . Immunofixation electrophoresis  . Protein electrophoresis, serum  . Erythropoietin  . Iron and TIBC  . Ferritin  . Vitamin B12  . Folate  . Lactate dehydrogenase  . Sedimentation rate  . Reticulocytes ([place])  . Haptoglobin  . CBC with Differential    Standing Status: Future     Number of Occurrences: 1     Standing Expiration Date: 11/29/2016  . Ferritin    Standing Status: Future     Number of Occurrences: 1     Standing Expiration Date: 11/29/2016  . Reticulocytes   All questions  were answered. The patient knows to call the clinic with any problems, questions or concerns..  This document serves as a record of services personally performed by Ancil Linsey, MD. It was created on her behalf by Toni Amend, a trained medical scribe. The creation of this record is based on the scribe's personal observations and the provider's statements to them. This document has been checked and approved by the attending provider.  I have reviewed the above documentation for accuracy and completeness, and I agree with the above.  This note was electronically signed.    Molli Hazard, MD  11/30/2015 4:05 PM

## 2015-12-01 LAB — HAPTOGLOBIN: Haptoglobin: 208 mg/dL — ABNORMAL HIGH (ref 34–200)

## 2015-12-01 LAB — IGG, IGA, IGM
IGA: 240 mg/dL (ref 64–422)
IGG (IMMUNOGLOBIN G), SERUM: 1150 mg/dL (ref 700–1600)
IGM, SERUM: 108 mg/dL (ref 26–217)

## 2015-12-01 LAB — PROTEIN ELECTROPHORESIS, SERUM
A/G RATIO SPE: 1 (ref 0.7–1.7)
ALPHA-1-GLOBULIN: 0.3 g/dL (ref 0.0–0.4)
ALPHA-2-GLOBULIN: 0.8 g/dL (ref 0.4–1.0)
Albumin ELP: 3.4 g/dL (ref 2.9–4.4)
BETA GLOBULIN: 1.3 g/dL (ref 0.7–1.3)
GLOBULIN, TOTAL: 3.5 g/dL (ref 2.2–3.9)
Gamma Globulin: 1.2 g/dL (ref 0.4–1.8)
Total Protein ELP: 6.9 g/dL (ref 6.0–8.5)

## 2015-12-01 LAB — ERYTHROPOIETIN: Erythropoietin: 15.1 m[IU]/mL (ref 2.6–18.5)

## 2015-12-01 LAB — KAPPA/LAMBDA LIGHT CHAINS
KAPPA, LAMDA LIGHT CHAIN RATIO: 1.39 (ref 0.26–1.65)
Kappa free light chain: 30.88 mg/L — ABNORMAL HIGH (ref 3.30–19.40)
LAMDA FREE LIGHT CHAINS: 22.14 mg/L (ref 5.71–26.30)

## 2015-12-01 LAB — IMMUNOFIXATION ELECTROPHORESIS
IGM, SERUM: 115 mg/dL (ref 26–217)
IgA: 245 mg/dL (ref 64–422)
IgG (Immunoglobin G), Serum: 1083 mg/dL (ref 700–1600)
Total Protein ELP: 6.8 g/dL (ref 6.0–8.5)

## 2015-12-05 ENCOUNTER — Encounter (HOSPITAL_COMMUNITY): Payer: Medicare Other | Attending: Hematology & Oncology

## 2015-12-05 ENCOUNTER — Ambulatory Visit (INDEPENDENT_AMBULATORY_CARE_PROVIDER_SITE_OTHER): Payer: Medicare Other | Admitting: Ophthalmology

## 2015-12-05 VITALS — BP 145/57 | HR 59 | Temp 97.7°F | Resp 16

## 2015-12-05 DIAGNOSIS — H35033 Hypertensive retinopathy, bilateral: Secondary | ICD-10-CM

## 2015-12-05 DIAGNOSIS — E113311 Type 2 diabetes mellitus with moderate nonproliferative diabetic retinopathy with macular edema, right eye: Secondary | ICD-10-CM

## 2015-12-05 DIAGNOSIS — D649 Anemia, unspecified: Secondary | ICD-10-CM | POA: Insufficient documentation

## 2015-12-05 DIAGNOSIS — E113392 Type 2 diabetes mellitus with moderate nonproliferative diabetic retinopathy without macular edema, left eye: Secondary | ICD-10-CM | POA: Diagnosis not present

## 2015-12-05 DIAGNOSIS — H34811 Central retinal vein occlusion, right eye, with macular edema: Secondary | ICD-10-CM | POA: Diagnosis not present

## 2015-12-05 DIAGNOSIS — D509 Iron deficiency anemia, unspecified: Secondary | ICD-10-CM | POA: Insufficient documentation

## 2015-12-05 DIAGNOSIS — H43811 Vitreous degeneration, right eye: Secondary | ICD-10-CM | POA: Diagnosis not present

## 2015-12-05 DIAGNOSIS — H34831 Tributary (branch) retinal vein occlusion, right eye, with macular edema: Secondary | ICD-10-CM

## 2015-12-05 DIAGNOSIS — H33302 Unspecified retinal break, left eye: Secondary | ICD-10-CM

## 2015-12-05 DIAGNOSIS — E11311 Type 2 diabetes mellitus with unspecified diabetic retinopathy with macular edema: Secondary | ICD-10-CM | POA: Diagnosis not present

## 2015-12-05 DIAGNOSIS — I10 Essential (primary) hypertension: Secondary | ICD-10-CM | POA: Diagnosis not present

## 2015-12-05 DIAGNOSIS — H35342 Macular cyst, hole, or pseudohole, left eye: Secondary | ICD-10-CM

## 2015-12-05 MED ORDER — SODIUM CHLORIDE 0.9 % IJ SOLN: 10.0000 mL | Freq: Once | INTRAMUSCULAR | Status: DC

## 2015-12-05 MED ORDER — SODIUM CHLORIDE 0.9 % IV SOLN
INTRAVENOUS | Status: DC
  Administered 2015-12-05: 14:00:00 via INTRAVENOUS

## 2015-12-05 MED ORDER — SODIUM CHLORIDE 0.9 % IV SOLN
510.0000 mg | Freq: Once | INTRAVENOUS | Status: AC
  Administered 2015-12-05: 510 mg via INTRAVENOUS
  Filled 2015-12-05: qty 17

## 2015-12-05 NOTE — Progress Notes (Signed)
Patient tolerated infusion well.  VSS post infusion.   

## 2015-12-05 NOTE — Patient Instructions (Signed)
[place] at [facility] Discharge Instructions  RECOMMENDATIONS MADE BY THE CONSULTANT AND ANY TEST RESULTS WILL BE SENT TO YOUR REFERRING PHYSICIAN.  IV iron today.    Thank you for choosing [place] at [facility] to provide your oncology and hematology care.  To afford each patient quality time with our provider, please arrive at least 15 minutes before your scheduled appointment time.    You need to re-schedule your appointment should you arrive 10 or more minutes late.  We strive to give you quality time with our providers, and arriving late affects you and other patients whose appointments are after yours.  Also, if you no show three or more times for appointments you may be dismissed from the clinic at the providers discretion.     Again, thank you for choosing [facility].  Our hope is that these requests will decrease the amount of time that you wait before being seen by our physicians.       _____________________________________________________________  Should you have questions after your visit to [facility], please contact our office at [phone] between the hours of 8:30 a.m. and 4:30 p.m.  Voicemails left after 4:30 p.m. will not be returned until the following business day.  For prescription refill requests, have your pharmacy contact our office.

## 2015-12-12 ENCOUNTER — Ambulatory Visit (HOSPITAL_COMMUNITY): Payer: Medicare Other

## 2015-12-13 ENCOUNTER — Encounter (HOSPITAL_BASED_OUTPATIENT_CLINIC_OR_DEPARTMENT_OTHER): Payer: Medicare Other

## 2015-12-13 VITALS — BP 128/59 | HR 67 | Temp 97.5°F | Resp 16

## 2015-12-13 DIAGNOSIS — D649 Anemia, unspecified: Secondary | ICD-10-CM | POA: Diagnosis not present

## 2015-12-13 MED ORDER — SODIUM CHLORIDE 0.9 % IV SOLN
INTRAVENOUS | Status: DC
  Administered 2015-12-13: 14:00:00 via INTRAVENOUS

## 2015-12-13 MED ORDER — SODIUM CHLORIDE 0.9 % IJ SOLN
10.0000 mL | Freq: Once | INTRAMUSCULAR | Status: AC
  Administered 2015-12-13: 10 mL via INTRAVENOUS

## 2015-12-13 MED ORDER — SODIUM CHLORIDE 0.9 % IV SOLN
510.0000 mg | Freq: Once | INTRAVENOUS | Status: AC
  Administered 2015-12-13: 510 mg via INTRAVENOUS
  Filled 2015-12-13: qty 17

## 2015-12-13 NOTE — Progress Notes (Signed)
Tolerated iron infusion well. 

## 2015-12-13 NOTE — Patient Instructions (Signed)
[facility] at [facility] Discharge Instructions  RECOMMENDATIONS MADE BY THE CONSULTANT AND ANY TEST RESULTS WILL BE SENT TO YOUR REFERRING PHYSICIAN.  Today you received Feraheme 510 mg iron infusion as ordered. Return as scheduled.  Thank you for choosing [facility] at [facility] to provide your oncology and hematology care.  To afford each patient quality time with our provider, please arrive at least 15 minutes before your scheduled appointment time.    You need to re-schedule your appointment should you arrive 10 or more minutes late.  We strive to give you quality time with our providers, and arriving late affects you and other patients whose appointments are after yours.  Also, if you no show three or more times for appointments you may be dismissed from the clinic at the providers discretion.     Again, thank you for choosing [facility].  Our hope is that these requests will decrease the amount of time that you wait before being seen by our physicians.       _____________________________________________________________  Should you have questions after your visit to [facility], please contact our office at [phone] between the hours of 8:30 a.m. and 4:30 p.m.  Voicemails left after 4:30 p.m. will not be returned until the following business day.  For prescription refill requests, have your pharmacy contact our office.    

## 2015-12-14 ENCOUNTER — Ambulatory Visit (INDEPENDENT_AMBULATORY_CARE_PROVIDER_SITE_OTHER): Payer: Medicare Other | Admitting: Family Medicine

## 2015-12-14 ENCOUNTER — Other Ambulatory Visit: Payer: Self-pay

## 2015-12-14 ENCOUNTER — Encounter: Payer: Self-pay | Admitting: Family Medicine

## 2015-12-14 VITALS — BP 120/66 | HR 58 | Resp 16 | Ht 66.0 in | Wt 232.0 lb

## 2015-12-14 DIAGNOSIS — I10 Essential (primary) hypertension: Secondary | ICD-10-CM | POA: Diagnosis not present

## 2015-12-14 DIAGNOSIS — E039 Hypothyroidism, unspecified: Secondary | ICD-10-CM | POA: Diagnosis not present

## 2015-12-14 DIAGNOSIS — G4700 Insomnia, unspecified: Secondary | ICD-10-CM

## 2015-12-14 DIAGNOSIS — D649 Anemia, unspecified: Secondary | ICD-10-CM

## 2015-12-14 DIAGNOSIS — E1165 Type 2 diabetes mellitus with hyperglycemia: Secondary | ICD-10-CM

## 2015-12-14 DIAGNOSIS — F418 Other specified anxiety disorders: Secondary | ICD-10-CM

## 2015-12-14 DIAGNOSIS — E1121 Type 2 diabetes mellitus with diabetic nephropathy: Secondary | ICD-10-CM | POA: Diagnosis not present

## 2015-12-14 DIAGNOSIS — M5440 Lumbago with sciatica, unspecified side: Secondary | ICD-10-CM

## 2015-12-14 DIAGNOSIS — E559 Vitamin D deficiency, unspecified: Secondary | ICD-10-CM

## 2015-12-14 DIAGNOSIS — R350 Frequency of micturition: Secondary | ICD-10-CM | POA: Diagnosis not present

## 2015-12-14 DIAGNOSIS — IMO0002 Reserved for concepts with insufficient information to code with codable children: Secondary | ICD-10-CM

## 2015-12-14 DIAGNOSIS — E785 Hyperlipidemia, unspecified: Secondary | ICD-10-CM

## 2015-12-14 LAB — POCT URINALYSIS DIPSTICK
Glucose, UA: NEGATIVE
KETONES UA: NEGATIVE
LEUKOCYTES UA: NEGATIVE
NITRITE UA: NEGATIVE
PROTEIN UA: NEGATIVE
Spec Grav, UA: >=1.03
Urobilinogen, UA: 1
pH, UA: 5.5

## 2015-12-14 MED ORDER — ONETOUCH VERIO VI STRP: ORAL_STRIP | Status: DC

## 2015-12-14 NOTE — Patient Instructions (Signed)
Annual wellness in 3.5 month, call if you need me sooner  Urine is being checked for infection, you will be called if you need an antibiotic  Excellent labs and blood pressure, no changes in medications  Fasting labs in 3.5 month  Thanks for choosing [facility], we consider it a privelige to serve you.  All the best for 2017!

## 2015-12-14 NOTE — Progress Notes (Signed)
Subjective:    Patient ID: [name], female    DOB: Sep 27, 1941, 74 y.o.   MRN: [id number]  HPI   [name]     MRN: [id number]      DOB: 05/23/1941   HPI [name] is here for follow up and re-evaluation of chronic medical conditions, medication management and review of any available recent lab and radiology data.  Preventive health is updated, specifically  Cancer screening and Immunization.   Questions or concerns regarding consultations or procedures which the PT has had in the interim are  addressed. The PT denies any adverse reactions to current medications since the last visit.  There are no new concerns.  Feels better now that she is compliant with ehr meds Denies polyuria, polydipsia, blurred vision , or hypoglycemic episodes.  ROS Denies recent fever or chills. Denies sinus pressure, nasal congestion, ear pain or sore throat. Denies chest congestion, productive cough or wheezing. Denies chest pains, palpitations and leg swelling Denies abdominal pain, nausea, vomiting,diarrhea or constipation.   Denies dysuria, frequency, hesitancy or incontinence. Chronic  joint pain, swelling and limitation in mobility. Denies headaches, seizures, numbness, or tingling. Denies depression, anxiety or insomnia. Denies skin break down or rash.   PE  BP 120/66 mmHg  Pulse 58  Resp 16  Ht 5\' 6"  (1.676 m)  Wt 232 lb (105.235 kg)  BMI 37.46 kg/m2  SpO2 97%  Patient alert and oriented and in no cardiopulmonary distress.  HEENT: No facial asymmetry, EOMI,   oropharynx pink and moist.  Neck supple no JVD, no mass.  Chest: Clear to auscultation bilaterally.  CVS: S1, S2 no murmurs, no S3.Regular rate.  ABD: Soft non tender.   Ext: No edema  MS: Adequate though decreased  ROM spine, shoulders, hips and knees.  Skin: Intact, no ulcerations or rash noted.  Psych: Good eye contact, normal affect. Memory intact not anxious or depressed appearing.  CNS: CN 2-12  intact, power,  normal throughout.no focal deficits noted.   Assessment & Plan  Essential hypertension, benign Controlled, no change in medication DASH diet and commitment to daily physical activity for a minimum of 30 minutes discussed and encouraged, as a part of hypertension management. The importance of attaining a healthy weight is also discussed.  BP/Weight 12/14/2015 12/13/2015 12/05/2015 11/30/2015 10/20/2015 09/07/2015 123456  Systolic BP 123456 0000000 Q000111Q Q000111Q - 123456 123456  Diastolic BP 66 59 57 57 - 58 80  Wt. 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 is reminded of the importance of commitment to daily physical activity for 30 minutes or more, as able and the need to limit carbohydrate intake to 30 to 60 grams per meal to help with blood sugar control.   The need to take medication as prescribed, test blood sugar as directed, and to call between visits if there is a concern that blood sugar is uncontrolled is also discussed.   [name] is reminded of the importance of daily foot exam, annual eye examination, and good blood sugar, blood pressure and cholesterol control.  Diabetic Labs Latest Ref Rng 11/30/2015 11/16/2015 08/25/2015 05/18/2015 01/26/2015  HbA1c <5.7 % - 6.8(H) 7.4(H) 8.2(H) 7.8(H)  Microalbumin Not estab mg/dL - 4.0 - - -  Micro/Creat Ratio <30 mcg/mg creat - 17 - - -  Chol 125 -  200 mg/dL - - 143 262(H) 196  HDL >=46 mg/dL - - 50 50 55  Calc LDL <130 mg/dL - - 79 187(H) 118(H)  Triglycerides <150 mg/dL - - 68 123 117  Creatinine 0.44 - 1.00 mg/dL 1.41(H) 0.99(H) 0.77 0.97 0.86   BP/Weight 12/14/2015 12/13/2015 12/05/2015 11/30/2015 10/20/2015 09/07/2015 123456  Systolic BP 123456 0000000 Q000111Q Q000111Q - 123456 123456  Diastolic BP 66 59 57 57 - 58 80  Wt. (Lbs) 232 - - 224.8 229 - 231  BMI 37.46 - - 36.3 36.98 - 37.3   Foot/eye exam  completion dates Latest Ref Rng 08/25/2015 09/14/2014  Eye Exam No Retinopathy - No Retinopathy  Foot Form Completion - Done -         Hyperlipemia Hyperlipidemia:Low fat diet discussed and encouraged.   Lipid Panel  Lab Results  Component Value Date   CHOL 143 08/25/2015   HDL 50 08/25/2015   LDLCALC 79 08/25/2015   TRIG 68 08/25/2015   CHOLHDL 2.9 08/25/2015   Controlled, no change in medication Updated lab needed at/ before next visit.      Morbid obesity Deteriorated. Patient re-educated about  the importance of commitment to a  minimum of 150 minutes of exercise per week.  The importance of healthy food choices with portion control discussed. Encouraged to start a food diary, count calories and to consider  joining a support group. Sample diet sheets offered. Goals set by the patient for the next several months.   Weight /BMI 12/14/2015 11/30/2015 10/20/2015  WEIGHT 232 lb 224 lb 12.8 oz 229 lb  HEIGHT 5\' 6"  - 5\' 6"   BMI 37.46 kg/m2 36.3 kg/m2 36.98 kg/m2    Current exercise per week 90 minutes.   Depression with anxiety Improved and controlled, no med change  Backache Unchanged and chronic, continue current management  Anemia, unspecified Currently being treated by hematology, underlying kidney disease is the prime contributor  Insomnia Sleep hygiene reviewed and written information offered also. Prescription sent for  medication needed.        Review of Systems     Objective:   Physical Exam        Assessment & Plan:

## 2015-12-15 ENCOUNTER — Ambulatory Visit: Payer: Medicare Other | Admitting: Nutrition

## 2015-12-15 NOTE — Assessment & Plan Note (Signed)
Sleep hygiene reviewed and written information offered also. Prescription sent for  medication needed.  

## 2015-12-15 NOTE — Assessment & Plan Note (Signed)
Deteriorated. Patient re-educated about  the importance of commitment to a  minimum of 150 minutes of exercise per week.  The importance of healthy food choices with portion control discussed. Encouraged to start a food diary, count calories and to consider  joining a support group. Sample diet sheets offered. Goals set by the patient for the next several months.   Weight /BMI 12/14/2015 11/30/2015 10/20/2015  WEIGHT 232 lb 224 lb 12.8 oz 229 lb  HEIGHT 5\' 6"  - 5\' 6"   BMI 37.46 kg/m2 36.3 kg/m2 36.98 kg/m2    Current exercise per week 90 minutes.

## 2015-12-15 NOTE — Assessment & Plan Note (Signed)
Improved and controlled, no med change 

## 2015-12-15 NOTE — Assessment & Plan Note (Signed)
Unchanged and chronic, continue current management

## 2015-12-15 NOTE — Assessment & Plan Note (Signed)
Markedly improved, and currently controlled [name] is reminded of the importance of commitment to daily physical activity for 30 minutes or more, as able and the need to limit carbohydrate intake to 30 to 60 grams per meal to help with blood sugar control.   The need to take medication as prescribed, test blood sugar as directed, and to call between visits if there is a concern that blood sugar is uncontrolled is also discussed.   [name] is reminded of the importance of daily foot exam, annual eye examination, and good blood sugar, blood pressure and cholesterol control.  Diabetic Labs Latest Ref Rng 11/30/2015 11/16/2015 08/25/2015 05/18/2015 01/26/2015  HbA1c <5.7 % - 6.8(H) 7.4(H) 8.2(H) 7.8(H)  Microalbumin Not estab mg/dL - 4.0 - - -  Micro/Creat Ratio <30 mcg/mg creat - 17 - - -  Chol 125 - 200 mg/dL - - 143 262(H) 196  HDL >=46 mg/dL - - 50 50 55  Calc LDL <130 mg/dL - - 79 187(H) 118(H)  Triglycerides <150 mg/dL - - 68 123 117  Creatinine 0.44 - 1.00 mg/dL 1.41(H) 0.99(H) 0.77 0.97 0.86   BP/Weight 12/14/2015 12/13/2015 12/05/2015 11/30/2015 10/20/2015 09/07/2015 123456  Systolic BP 123456 0000000 Q000111Q Q000111Q - 123456 123456  Diastolic BP 66 59 57 57 - 58 80  Wt. (Lbs) 232 - - 224.8 229 - 231  BMI 37.46 - - 36.3 36.98 - 37.3   Foot/eye exam completion dates Latest Ref Rng 08/25/2015 09/14/2014  Eye Exam No Retinopathy - No Retinopathy  Foot Form Completion - Done -

## 2015-12-15 NOTE — Assessment & Plan Note (Signed)
Controlled, no change in medication DASH diet and commitment to daily physical activity for a minimum of 30 minutes discussed and encouraged, as a part of hypertension management. The importance of attaining a healthy weight is also discussed.  BP/Weight 12/14/2015 12/13/2015 12/05/2015 11/30/2015 10/20/2015 09/07/2015 123456  Systolic BP 123456 0000000 Q000111Q Q000111Q - 123456 123456  Diastolic BP 66 59 57 57 - 58 80  Wt. (Lbs) 232 - - 224.8 229 - 231  BMI 37.46 - - 36.3 36.98 - 37.3

## 2015-12-15 NOTE — Assessment & Plan Note (Signed)
Currently being treated by hematology, underlying kidney disease is the prime contributor

## 2015-12-15 NOTE — Assessment & Plan Note (Signed)
Hyperlipidemia:Low fat diet discussed and encouraged.   Lipid Panel  Lab Results  Component Value Date   CHOL 143 08/25/2015   HDL 50 08/25/2015   LDLCALC 79 08/25/2015   TRIG 68 08/25/2015   CHOLHDL 2.9 08/25/2015   Controlled, no change in medication Updated lab needed at/ before next visit.

## 2015-12-20 ENCOUNTER — Ambulatory Visit: Payer: Medicare Other | Admitting: Family Medicine

## 2015-12-20 ENCOUNTER — Other Ambulatory Visit: Payer: Self-pay | Admitting: Family Medicine

## 2015-12-27 ENCOUNTER — Other Ambulatory Visit: Payer: Self-pay

## 2015-12-27 MED ORDER — HYDROCODONE-ACETAMINOPHEN 10-325 MG PO TABS: ORAL_TABLET | ORAL | Status: DC

## 2015-12-28 ENCOUNTER — Encounter (HOSPITAL_BASED_OUTPATIENT_CLINIC_OR_DEPARTMENT_OTHER): Payer: Medicare Other

## 2015-12-28 DIAGNOSIS — D509 Iron deficiency anemia, unspecified: Secondary | ICD-10-CM | POA: Diagnosis not present

## 2015-12-28 DIAGNOSIS — D649 Anemia, unspecified: Secondary | ICD-10-CM | POA: Diagnosis not present

## 2015-12-28 LAB — CBC WITH DIFFERENTIAL/PLATELET
BASOS PCT: 0 %
Basophils Absolute: 0 10*3/uL (ref 0.0–0.1)
EOS PCT: 2 %
Eosinophils Absolute: 0.2 10*3/uL (ref 0.0–0.7)
HEMATOCRIT: 30.1 % — AB (ref 36.0–46.0)
HEMOGLOBIN: 9.5 g/dL — AB (ref 12.0–15.0)
LYMPHS ABS: 2 10*3/uL (ref 0.7–4.0)
Lymphocytes Relative: 24 %
MCH: 28.8 pg (ref 26.0–34.0)
MCHC: 31.6 g/dL (ref 30.0–36.0)
MCV: 91.2 fL (ref 78.0–100.0)
MONOS PCT: 8 %
Monocytes Absolute: 0.7 10*3/uL (ref 0.1–1.0)
Neutro Abs: 5.5 10*3/uL (ref 1.7–7.7)
Neutrophils Relative %: 66 %
Platelets: 340 10*3/uL (ref 150–400)
RBC: 3.3 MIL/uL — AB (ref 3.87–5.11)
RDW: 18.8 % — ABNORMAL HIGH (ref 11.5–15.5)
WBC: 8.4 10*3/uL (ref 4.0–10.5)

## 2015-12-28 LAB — RETICULOCYTES
RBC.: 3.3 MIL/uL — AB (ref 3.87–5.11)
RETIC CT PCT: 2.4 % (ref 0.4–3.1)
Retic Count, Absolute: 79.2 10*3/uL (ref 19.0–186.0)

## 2015-12-28 LAB — FERRITIN: Ferritin: 205 ng/mL (ref 11–307)

## 2015-12-28 NOTE — Progress Notes (Signed)
[name] presented for labwork. Labs per MD order drawn via Peripheral Line 24 gauge needle inserted in lt ac area  Good blood return present. Procedure without incident.  Needle removed intact. Patient tolerated procedure well.

## 2015-12-30 ENCOUNTER — Ambulatory Visit (HOSPITAL_COMMUNITY): Payer: Medicare Other | Admitting: Oncology

## 2015-12-30 ENCOUNTER — Encounter (HOSPITAL_COMMUNITY): Payer: Self-pay | Admitting: Hematology & Oncology

## 2015-12-30 DIAGNOSIS — N189 Chronic kidney disease, unspecified: Secondary | ICD-10-CM

## 2015-12-30 DIAGNOSIS — D631 Anemia in chronic kidney disease: Secondary | ICD-10-CM

## 2015-12-30 HISTORY — DX: Anemia in chronic kidney disease: D63.1

## 2015-12-30 NOTE — Assessment & Plan Note (Deleted)
Iron deficiency anemia in the setting of chronic renal disease, Stage III.  She likely has an element of anemia secondary to chronic renal disease.  She just completed IV iron replacement 2 weeks ago with Feraheme 1020 mg.  Her ferritin is now > 100 but her Hgb is stable since completing iron replacement.  Her Hgb has not demonstrated a response to therapy at this time.  As a result, she would benefit from a bone marrow aspiration and biopsy.  If negative, then we can contribute her persistent anemia to anemia of chronic renal disease.

## 2015-12-30 NOTE — Progress Notes (Signed)
Patient rescheduled

## 2016-01-04 NOTE — Progress Notes (Signed)
[name], MD [address] [place] [address]  Iron deficiency anemia  CURRENT THERAPY: IV feraheme 510 mg   Oncology Flowsheet 12/05/2015 12/13/2015  ferumoxytol (FERAHEME) IV 510 mg 510 mg    INTERVAL HISTORY: [name] 75 y.o. female returns for followup of iron deficiency anemia in the setting of chronic renal disease, Stage III.  I personally reviewed and went over laboratory results with the patient.  The results are noted within this dictation.  Her HGB is stable but her ferritin is now > 100.  She just completed IV feraheme replacement of 1020 mg on 12/13/2015.  Further anemia work-up was negative except for an elevated ESR in the setting of Stage III chronic renal disease.    I personally reviewed and went over radiographic studies with the patient.  The results are noted within this dictation.  She is up to date on mammography.  "I feel a lot better."  She admits to improved energy and improved stamina.  She notes undocumented weight loss and reports that her clothes are fitting loosely.  She denies any physical exercising.  Her weight is CHL is very stable with a recent increase in her weight.  In detail, I reviewed her labs as mentioned above with the patient.  I am concerned that she is 2 weeks out from completing 1020 mg of IV feraheme with a ferritin level of >200 and no change in her Hgb.  As a result, I broached the subject of a bone marrow aspiration and biopsy.  I educated her on the role of her bone marrow and the role of adequate iron stores.    Past Medical History  Diagnosis Date  . Hypertension   . Tobacco abuse   . Myasthenia gravis   . Gastroesophageal reflux   . Thyroid disease     "used to take RX; they took me off it" (01/19/2014)  . Iron deficiency anemia   . Depression   . Hyperlipidemia   . Obesity   . Carpal tunnel syndrome   . Shoulder pain   . Impingement syndrome, shoulder   . Pulmonary HTN ([place])   .  Mitral regurgitation   . Schatzki's ring     Last EGD w/ dilation 02/08/11, 2009 & 2007  . Esophagitis, erosive 2009  . Diverticulosis 07/2004    Colonscopy Dr Gala Romney  . Heart murmur     saw cardiology In [place], he told her she did not need to come back.  . Seasonal allergies   . Chronic bronchitis ([place])     "got it q yr for awhile; hasn't had it in awhile" (01/19/2014)  . Exertional shortness of breath   . OSA on CPAP   . Type II diabetes mellitus ([place])   . LEXNTZGY(174.9)     "usually a couple times/wk" (01/19/2014)  . Arthritis     "all over" (01/19/2014)  . Anxiety   . Pneumonia 01/2012  . Myasthenia gravis in remission ([place]) 11/24/2014  . Chronic kidney disease (CKD) stage G3b/A1, moderately decreased glomerular filtration rate (GFR) between 30-44 mL/min/1.73 square meter and albuminuria creatinine ratio less than 30 mg/g 09/14/2009    Qualifier: Diagnosis of  By: Moshe Cipro MD, Joycelyn Schmid    . Chronic renal disease, stage 3, moderately decreased glomerular filtration rate (GFR) between 30-59 mL/min/1.73 square meter 09/14/2009    Qualifier: Diagnosis of  By: Moshe Cipro MD, Joycelyn Schmid    . 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 ([place]); Hyperlipemia; Iron deficiency anemia; Depression with anxiety; CARPAL TUNNEL SYNDROME; GLAUCOMA; GERD; Chronic renal disease, stage 3, moderately decreased glomerular filtration rate (GFR) between 30-59 mL/min/1.73 square meter; DYSCHROMIA, UNSPECIFIED; DEGENERATIVE JOINT DISEASE, KNEE; SHOULDER PAIN; HIP PAIN, LEFT; KNEE PAIN; Backache; IMPINGEMENT SYNDROME; SLEEP APNEA; UNSPECIFIED URINARY INCONTINENCE; INGROWN TOENAIL; Constipation; Schatzki's ring; Essential hypertension, benign; Insomnia; Abnormal chest CT; Generalized osteoarthritis; Difficulty in walking(719.7); Bilateral leg weakness; Macular hole of left eye; Osteopenia; Vitamin D deficiency; Myasthenia gravis ([place]); Myasthenia  gravis in remission ([place]); Morbid obesity ([place]); and Anemia in chronic renal disease on her problem list.     has No Known Allergies.  Current Outpatient Prescriptions on File Prior to Visit  Medication Sig Dispense Refill  . amLODipine (NORVASC) 10 MG tablet TAKE 1 TABLET BY MOUTH ONCE A DAY. 30 tablet 1  . B-D ULTRAFINE III SHORT PEN 31G X 8 MM MISC USE ONCE DAILY WITH LANTUS SOLOSTAR PEN. 100 each 2  . brimonidine-timolol (COMBIGAN) 0.2-0.5 % ophthalmic solution Place 1 drop into both eyes every 12 (twelve) hours.    . cetirizine (ZYRTEC) 10 MG tablet TAKE ONE TABLET BY MOUTH DAILY. 30 tablet 6  . citalopram (CELEXA) 40 MG tablet TAKE ONE TABLET BY MOUTH DAILY. 30 tablet 0  . clonazePAM (KLONOPIN) 0.5 MG tablet TAKE ONE TABLET BY MOUTH 3 TIMES DAILY AS NEEDED FOR ANXIETY. 90 tablet 2  . Cranberry 1000 MG CAPS Take 1,000 mg by mouth daily.     . CRESTOR 40 MG tablet TAKE (1) TABLET BY MOUTH AT BEDTIME. 30 tablet 3  . cycloSPORINE (RESTASIS) 0.05 % ophthalmic emulsion Place 1 drop into both eyes 2 (two) times daily.     Marland Kitchen diltiazem (CARDIZEM CD) 240 MG 24 hr capsule TAKE (1) CAPSULE BY MOUTH TWICE DAILY. 60 capsule 3  . Flaxseed, Linseed, (FLAXSEED OIL) 1000 MG CAPS Take 1,000 mg by mouth daily.    . fluticasone (FLONASE) 50 MCG/ACT nasal spray Place 2 sprays into both nostrils daily as needed for allergies or rhinitis. 16 g 2  . gabapentin (NEURONTIN) 300 MG capsule TAKE 1 CAPSULE BY MOUTH THREE TIMES A DAY. 90 capsule 0  . glipiZIDE-metformin (METAGLIP) 2.5-500 MG tablet TAKE 1 TABLETS BY MOUTH TWICE A DAY BEFORE MEALS. 60 tablet 3  . hydrALAZINE (APRESOLINE) 25 MG tablet TAKE 1 TABLET BY MOUTH THREE TIMES A DAY. 90 tablet 3  . HYDROcodone-acetaminophen (NORCO) 10-325 MG tablet TAKE 1 TABLET BY MOUTH 3 TIMES DAILY. 90 tablet 0  . LANTUS SOLOSTAR 100 UNIT/ML Solostar Pen INJECT 30 UNITS SUBCUTANEOUSLY ONCE DAILY. 15 mL 2  . losartan (COZAAR) 100 MG tablet TAKE (1) TABLET BY MOUTH DAILY. 30  tablet 0  . Multiple Vitamin (MULTIVITAMIN WITH MINERALS) TABS tablet Take 1 tablet by mouth at bedtime.    Marland Kitchen omeprazole (PRILOSEC) 20 MG capsule TAKE ONE CAPSULE BY MOUTH DAILY. 30 capsule 0  . ondansetron (ZOFRAN) 4 MG tablet TAKE (1) TABLET BY MOUTH EVERY FOUR HOURS AS NEEDED FOR NAUSEA. 30 tablet 2  . ONETOUCH VERIO test strip Use with one touch verio machine 3x daily testing 100 each 1  . oxybutynin (DITROPAN-XL) 10 MG 24 hr tablet TAKE 1 TABLET BY MOUTH DAILY. 30 tablet 0  . polyethylene glycol powder (GLYCOLAX/MIRALAX) powder Take 17 g by mouth daily. 3350 g 1  . prednisoLONE acetate (PRED FORTE) 1 % ophthalmic suspension Place 1 drop into the left eye 4 (four)  times daily. 5 mL 0  . predniSONE (DELTASONE) 5 MG tablet TAKE 1 TABLET BY MOUTH DAILY. 30 tablet 0  . PROAIR HFA 108 (90 BASE) MCG/ACT inhaler INHALE 2 PUFFS BY MOUTH EVERY 6 HOURS AS NEEDED . 8.5 g 3  . promethazine (PHENERGAN) 25 MG tablet Take 25 mg by mouth daily as needed for nausea.    Marland Kitchen pyridostigmine (MESTINON) 60 MG tablet TAKE (1) TABLET BY MOUTH THREE TIMES A DAY. 90 tablet 3  . spironolactone (ALDACTONE) 25 MG tablet TAKE 1 TABLET BY MOUTH ONCE A DAY. 30 tablet 0  . temazepam (RESTORIL) 30 MG capsule Take 30 mg by mouth at bedtime as needed for sleep.    . TRADJENTA 5 MG TABS tablet TAKE 1 TABLET BY MOUTH ONCE A DAY. 30 tablet 0  . venlafaxine XR (EFFEXOR-XR) 150 MG 24 hr capsule TAKE 1 CAPSULE BY MOUTH DAILY. 30 capsule 0  . Vitamin D, Ergocalciferol, (DRISDOL) 50000 UNITS CAPS capsule TAKE 1 CAPSULE BY MOUTH ONCE A WEEK. 4 capsule 0   No current facility-administered medications on file prior to visit.    Past Surgical History  Procedure Laterality Date  . Total knee arthroplasty Right 05/13/07    Dr. Aline Brochure  . Carpal tunnel release Bilateral   . Incontinence surgery  08/26/09    [facility]  . Cholecystectomy    . Abdominal hysterectomy    . Eye surgery    . Esophagogastroduodenoscopy  02/08/11     [facility]k-Distal esophageal erosion consistent with mild erosive reflux   esophagitis/ Noncritical Schatzki ring, small hiatal hernia otherwise upper/ gastrointestinal tract appeared unremarkable, status post passage  of a Maloney dilation to biopsy disruption of the ring described  . Colonoscopy  05/08/2012    ZOX:[id number] and external hemorrhoids; colonic diverticulosis  . Joint replacement      right total knee  . Appendectomy    . Tonsillectomy    . Cataract extraction w/phaco Left 10/21/2013    Procedure: LEFT CATARACT EXTRACTION PHACO AND INTRAOCULAR LENS PLACEMENT (IOC);  Surgeon: Marylynn Pearson, MD;  Location: [facility];  Service: Ophthalmology;  Laterality: Left;  . 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 Esophageal dilation      "more than 3 times" (01/19/2014)  . 25 gauge pars plana vitrectomy with 20 gauge mvr port Left 01/19/2014    Procedure: 25 GAUGE PARS PLANA VITRECTOMY WITH 20 GAUGE MVR PORT; MEMBRAME PEEL; SERUM PATCH; LASER TREATMENT; C3F8;  Surgeon: Hayden Pedro, MD;  Location: [facility];  Service: Ophthalmology;  Laterality: Left;  . Esophagogastroduodenoscopy  04/2012    2 tandem incomplete distal esophagea rings s/p dilation.   . Esophagogastroduodenoscopy N/A 09/16/2014    Procedure: ESOPHAGOGASTRODUODENOSCOPY (EGD);  Surgeon: Daneil Dolin, MD;  [facility];  Service: Endoscopy;  Laterality: N/A;  1200  . Savory dilation N/A 09/16/2014    Procedure: SAVORY DILATION;  Surgeon: Daneil Dolin, MD;  [facility];  Service: Endoscopy;  Laterality: N/A;  Venia Minks dilation N/A 09/16/2014    Procedure: Venia Minks DILATION;  Surgeon: Daneil Dolin, MD;  [facility];  Service: Endoscopy;  Laterality: N/A;    Denies any headaches, dizziness, double vision, fevers, chills, night sweats, nausea, vomiting, diarrhea, constipation, chest pain, heart palpitations, shortness of breath, blood in stool, black tarry stool, urinary pain, urinary  burning, urinary frequency, hematuria.   PHYSICAL EXAMINATION  ECOG PERFORMANCE STATUS: 1 - Symptomatic but completely ambulatory  Filed Vitals:  01/05/16 1444  BP: 141/47  Pulse: 62  Temp: 98.3 F (36.8 C)  Resp: 18    GENERAL:alert, no distress, comfortable, cooperative, obese, smiling and unaccompanied SKIN: skin color, texture, turgor are normal, no rashes or significant lesions HEAD: Normocephalic, No masses, lesions, tenderness or abnormalities EYES: normal, PERRLA, Conjunctiva are pink and non-injected EARS: External ears normal OROPHARYNX:lips, buccal mucosa, and tongue normal and mucous membranes are moist  NECK: supple, trachea midline LYMPH:  not examined BREAST:not examined LUNGS: clear to auscultation  HEART: regular rate & rhythm ABDOMEN:abdomen soft, non-tender and obese BACK: Back symmetric, no curvature. EXTREMITIES:less then 2 second capillary refill, no joint deformities, effusion, or inflammation, no skin discoloration, no clubbing, no cyanosis  NEURO: alert & oriented x 3 with fluent speech, no focal motor/sensory deficits, gait normal   LABORATORY DATA: CBC    Component Value Date/Time   WBC 8.4 12/28/2015 1410   RBC 3.30* 12/28/2015 1410   RBC 3.30* 12/28/2015 1410   HGB 9.5* 12/28/2015 1410   HCT 30.1* 12/28/2015 1410   PLT 340 12/28/2015 1410   MCV 91.2 12/28/2015 1410   MCH 28.8 12/28/2015 1410   MCHC 31.6 12/28/2015 1410   RDW 18.8* 12/28/2015 1410   LYMPHSABS 2.0 12/28/2015 1410   MONOABS 0.7 12/28/2015 1410   EOSABS 0.2 12/28/2015 1410   BASOSABS 0.0 12/28/2015 1410      Chemistry      Component Value Date/Time   NA 137 11/30/2015 1609   K 3.9 11/30/2015 1609   CL 104 11/30/2015 1609   CO2 25 11/30/2015 1609   BUN 32* 11/30/2015 1609   CREATININE 1.41* 11/30/2015 1609   CREATININE 0.99* 11/16/2015 1201      Component Value Date/Time   CALCIUM 9.4 11/30/2015 1609   ALKPHOS 138* 11/30/2015 1609   AST 14* 11/30/2015 1609    ALT 12* 11/30/2015 1609   BILITOT 0.4 11/30/2015 1609     Lab Results  Component Value Date   IRON 54 11/30/2015   TIBC 449 11/30/2015   FERRITIN 205 12/28/2015   Lab Results  Component Value Date   TSH 1.148 08/25/2015   Lab Results  Component Value Date   VITAMINB12 338 11/30/2015   Lab Results  Component Value Date   FOLATE 9.4 11/30/2015   Lab Results  Component Value Date   ESRSEDRATE 52* 11/30/2015   Lab Results  Component Value Date   PROT 7.8 11/30/2015   ALBUMINELP 3.4 11/30/2015   A1GS 0.3 11/30/2015   A2GS 0.8 11/30/2015   BETS 1.3 11/30/2015   GAMS 1.2 11/30/2015   MSPIKE Not Observed 11/30/2015   SPEI Comment 11/30/2015   SPECOM Comment 11/30/2015   IGGSERUM 1150 11/30/2015   IGGSERUM 1083 11/30/2015   IGA 240 11/30/2015   IGA 245 11/30/2015   IGMSERUM 108 11/30/2015   IGMSERUM 115 11/30/2015   KPAFRELGTCHN 30.88* 11/30/2015   LAMBDASER 22.14 11/30/2015   KAPLAMBRATIO 1.39 11/30/2015     PENDING LABS:   RADIOGRAPHIC STUDIES:  No results found.   PATHOLOGY:    ASSESSMENT AND PLAN:  Iron deficiency anemia Iron deficiency anemia in the setting of chronic renal disease, Stage III.  She likely has an element of anemia secondary to chronic renal disease.  She just completed IV iron replacement 2 weeks ago with Feraheme 1020 mg.  Her ferritin is now > 100 but her Hgb is stable since completing iron replacement.  Her Hgb has not demonstrated a response to therapy at this time.  As a result, she would benefit from a bone marrow aspiration and biopsy.  If negative, then we can contribute her persistent anemia to anemia of chronic renal disease.  HOWEVER, symptomatically she is much improved.  She denies any complaints including B symptoms.  She is not opposed to a bone marrow aspiration and biopsy, but she is surprised that she needs one since she feels much improved.  We've made a compromise together:  Labs in 4 weeks: CBC diff, iron/TIBC,  ferritin  Return in 4 weeks for follow-up.  If RBC/HGB is not significantly improved, then we will proceed with ordering and scheduling of a bone marrow aspiration and biopsy.  I will review that in great detail if needed at her follow-up appointment.  She's agreeable to this plan.      THERAPY PLAN:  Monitor counts in 4-6 weeks.  If no significant improvement, will proceed with bone marrow aspiration and biopsy.  All questions were answered. The patient knows to call the clinic with any problems, questions or concerns. We can certainly see the patient much sooner if necessary.  Patient and plan discussed with Dr. Ancil Linsey and she is in agreement with the aforementioned.   This note is electronically signed by: Doy Mince 01/05/2016 4:45 PM

## 2016-01-04 NOTE — Assessment & Plan Note (Addendum)
Iron deficiency anemia in the setting of chronic renal disease, Stage III.  She likely has an element of anemia secondary to chronic renal disease.  She just completed IV iron replacement 2 weeks ago with Feraheme 1020 mg.  Her ferritin is now > 100 but her Hgb is stable since completing iron replacement.  Her Hgb has not demonstrated a response to therapy at this time.  As a result, she would benefit from a bone marrow aspiration and biopsy.  If negative, then we can contribute her persistent anemia to anemia of chronic renal disease.  HOWEVER, symptomatically she is much improved.  She denies any complaints including B symptoms.  She is not opposed to a bone marrow aspiration and biopsy, but she is surprised that she needs one since she feels much improved.  We've made a compromise together:  Labs in 4 weeks: CBC diff, iron/TIBC, ferritin  Return in 4 weeks for follow-up.  If RBC/HGB is not significantly improved, then we will proceed with ordering and scheduling of a bone marrow aspiration and biopsy.  I will review that in great detail if needed at her follow-up appointment.  She's agreeable to this plan.

## 2016-01-05 ENCOUNTER — Encounter (HOSPITAL_COMMUNITY): Payer: Self-pay | Admitting: Oncology

## 2016-01-05 ENCOUNTER — Encounter (HOSPITAL_COMMUNITY): Payer: Medicare Other | Attending: Oncology | Admitting: Oncology

## 2016-01-05 VITALS — BP 141/47 | HR 62 | Temp 98.3°F | Resp 18 | Wt 232.4 lb

## 2016-01-05 DIAGNOSIS — D509 Iron deficiency anemia, unspecified: Secondary | ICD-10-CM

## 2016-01-05 DIAGNOSIS — N183 Chronic kidney disease, stage 3 (moderate): Secondary | ICD-10-CM

## 2016-01-05 NOTE — Patient Instructions (Signed)
[place] at [facility] Discharge Instructions  RECOMMENDATIONS MADE BY THE CONSULTANT AND ANY TEST RESULTS WILL BE SENT TO YOUR REFERRING PHYSICIAN.  Exam and discussion by Robynn Pane, PA-C Your iron level is better but your hemoglobin isn't. Will recheck your blood work in 4 weeks and see you afterwards.  If you hemoglobin is not increased will need to do Bone Marrow Biopsy and Aspiration.  Follow-up:  See schedule.  Thank you for choosing [place] at [facility] to provide your oncology and hematology care.  To afford each patient quality time with our provider, please arrive at least 15 minutes before your scheduled appointment time.    You need to re-schedule your appointment should you arrive 10 or more minutes late.  We strive to give you quality time with our providers, and arriving late affects you and other patients whose appointments are after yours.  Also, if you no show three or more times for appointments you may be dismissed from the clinic at the providers discretion.     Again, thank you for choosing [facility].  Our hope is that these requests will decrease the amount of time that you wait before being seen by our physicians.       _____________________________________________________________  Should you have questions after your visit to [facility], please contact our office at [phone] between the hours of 8:30 a.m. and 4:30 p.m.  Voicemails left after 4:30 p.m. will not be returned until the following business day.  For prescription refill requests, have your pharmacy contact our office.

## 2016-01-18 ENCOUNTER — Other Ambulatory Visit: Payer: Self-pay | Admitting: Family Medicine

## 2016-01-27 ENCOUNTER — Other Ambulatory Visit: Payer: Self-pay

## 2016-01-27 MED ORDER — HYDROCODONE-ACETAMINOPHEN 10-325 MG PO TABS: ORAL_TABLET | ORAL | Status: DC

## 2016-01-30 ENCOUNTER — Encounter (HOSPITAL_COMMUNITY): Payer: Medicare Other

## 2016-01-30 DIAGNOSIS — D509 Iron deficiency anemia, unspecified: Secondary | ICD-10-CM | POA: Diagnosis not present

## 2016-01-30 LAB — CBC WITH DIFFERENTIAL/PLATELET
BASOS ABS: 0 10*3/uL (ref 0.0–0.1)
BASOS PCT: 0 %
Eosinophils Absolute: 0.1 10*3/uL (ref 0.0–0.7)
Eosinophils Relative: 1 %
HCT: 31.7 % — ABNORMAL LOW (ref 36.0–46.0)
HEMOGLOBIN: 10.1 g/dL — AB (ref 12.0–15.0)
LYMPHS ABS: 2.1 10*3/uL (ref 0.7–4.0)
Lymphocytes Relative: 28 %
MCH: 29.1 pg (ref 26.0–34.0)
MCHC: 31.9 g/dL (ref 30.0–36.0)
MCV: 91.4 fL (ref 78.0–100.0)
Monocytes Absolute: 0.5 10*3/uL (ref 0.1–1.0)
Monocytes Relative: 7 %
NEUTROS PCT: 64 %
Neutro Abs: 4.6 10*3/uL (ref 1.7–7.7)
PLATELETS: 281 10*3/uL (ref 150–400)
RBC: 3.47 MIL/uL — AB (ref 3.87–5.11)
RDW: 18.1 % — ABNORMAL HIGH (ref 11.5–15.5)
WBC: 7.3 10*3/uL (ref 4.0–10.5)

## 2016-01-30 LAB — FERRITIN: FERRITIN: 82 ng/mL (ref 11–307)

## 2016-01-31 ENCOUNTER — Other Ambulatory Visit (HOSPITAL_COMMUNITY): Payer: Self-pay | Admitting: Oncology

## 2016-01-31 DIAGNOSIS — D509 Iron deficiency anemia, unspecified: Secondary | ICD-10-CM

## 2016-02-02 ENCOUNTER — Encounter (HOSPITAL_COMMUNITY): Payer: Medicare Other | Attending: Hematology & Oncology | Admitting: Oncology

## 2016-02-02 VITALS — BP 139/56 | HR 65 | Temp 98.6°F | Resp 16 | Wt 237.0 lb

## 2016-02-02 DIAGNOSIS — N183 Chronic kidney disease, stage 3 (moderate): Secondary | ICD-10-CM

## 2016-02-02 DIAGNOSIS — D649 Anemia, unspecified: Secondary | ICD-10-CM | POA: Insufficient documentation

## 2016-02-02 DIAGNOSIS — D509 Iron deficiency anemia, unspecified: Secondary | ICD-10-CM | POA: Diagnosis not present

## 2016-02-02 NOTE — Assessment & Plan Note (Addendum)
Iron deficiency anemia in the setting of chronic renal disease, Stage III.  She likely has an element of anemia secondary to chronic renal disease.  She has required IV iron replacement with IV feraheme 510 mg x 2:  Oncology Flowsheet 12/05/2015 12/13/2015  ferumoxytol [facility]) IV 510 mg 510 mg   I personally reviewed and went over laboratory results with the patient.  The results are noted within this dictation.  Labs on 01/30/2016 demonstrate an iron deficit of ~430 mg.  I will get her set-up for another IV feraheme infusion x 1.  This is presently scheduled for 02/14/2016.  Given her continued iron deficiency 0.5 g/dL response thus far, we will continue to evaluate her response to IV iron replacement.  Labs in 6 weeks: CBC diff, iron/TIBC, ferritin  I will add some hemolytic labs as well in 6 weeks given her aortic stenosis murmur: Haptoglobin, LDH    Return in 6 weeks for follow-up.  If RBC/HGB is not significantly improved, then we will proceed with ordering and scheduling of a bone marrow aspiration and biopsy. This can be reviewed in more detail in 6 weeks if indicated.  She does have an aortic stenosis murmur.  I wonder if she has some mechanical, extracorpuscular hemolysis as a result.

## 2016-02-02 NOTE — Patient Instructions (Addendum)
[place] at [facility] Discharge Instructions  RECOMMENDATIONS MADE BY THE CONSULTANT AND ANY TEST RESULTS WILL BE SENT TO YOUR REFERRING PHYSICIAN.  Exam and discussion today with Kirby Crigler, PA. Iron infusion as scheduled next week. Follow up in 6 weeks with Dr. Whitney Muse.  Thank you for choosing [place] at [facility] to provide your oncology and hematology care.  To afford each patient quality time with our provider, please arrive at least 15 minutes before your scheduled appointment time.   Beginning January 23rd 2017 lab work for the [facility] will be done in the  Main lab at [facility] on 1st floor. If you have a lab appointment with the [facility] please come in thru the  Main Entrance and check in at the main information desk  You need to re-schedule your appointment should you arrive 10 or more minutes late.  We strive to give you quality time with our providers, and arriving late affects you and other patients whose appointments are after yours.  Also, if you no show three or more times for appointments you may be dismissed from the clinic at the providers discretion.     Again, thank you for choosing [facility].  Our hope is that these requests will decrease the amount of time that you wait before being seen by our physicians.       _____________________________________________________________  Should you have questions after your visit to [facility], please contact our office at [phone] between the hours of 8:30 a.m. and 4:30 p.m.  Voicemails left after 4:30 p.m. will not be returned until the following business day.  For prescription refill requests, have your pharmacy contact our office.

## 2016-02-02 NOTE — Progress Notes (Signed)
[name], MD [name] [place] [address]  Iron deficiency anemia - Plan: CBC with Differential, Iron and TIBC, Ferritin, Haptoglobin, Lactate dehydrogenase  CURRENT THERAPY: IV feraheme 510 mg   Oncology Flowsheet 12/05/2015 12/13/2015  ferumoxytol (FERAHEME) IV 510 mg 510 mg    INTERVAL HISTORY: [name] 75 y.o. female returns for followup of iron deficiency anemia in the setting of chronic renal disease, Stage III.  I personally reviewed and went over laboratory results with the patient.  The results are noted within this dictation.  Her HGB is stable but her ferritin is now > 100.  She just completed IV feraheme replacement of 1020 mg on 12/13/2015.  Further anemia work-up was negative except for an elevated ESR in the setting of Stage III chronic renal disease.    I personally reviewed and went over radiographic studies with the patient.  The results are noted within this dictation.  She is up to date on mammography.  She reports that she feels well.  After reviewing her labs showing a ferritin less than 100, she reports that she is not surprised as she has noted some increased fatigue.     Past Medical History  Diagnosis Date  . Hypertension   . Tobacco abuse   . Myasthenia gravis   . Gastroesophageal reflux   . Thyroid disease     "used to take RX; they took me off it" (01/19/2014)  . Iron deficiency anemia   . Depression   . Hyperlipidemia   . Obesity   . Carpal tunnel syndrome   . Shoulder pain   . Impingement syndrome, shoulder   . Pulmonary HTN ([place])   . Mitral regurgitation   . Schatzki's ring     Last EGD w/ dilation 02/08/11, 2009 & 2007  . Esophagitis, erosive 2009  . Diverticulosis 07/2004    Colonscopy Dr Gala Romney  . Heart murmur     saw cardiology In [place], he told her she did not need to come back.  . Seasonal allergies   . Chronic bronchitis ([place])     "got it q yr for awhile; hasn't had it in awhile"  (01/19/2014)  . Exertional shortness of breath   . OSA on CPAP   . Type II diabetes mellitus ([place])   . FVCBSWHQ(759.1)     "usually a couple times/wk" (01/19/2014)  . Arthritis     "all over" (01/19/2014)  . Anxiety   . Pneumonia 01/2012  . Myasthenia gravis in remission ([place]) 11/24/2014  . Chronic kidney disease (CKD) stage G3b/A1, moderately decreased glomerular filtration rate (GFR) between 30-44 mL/min/1.73 square meter and albuminuria creatinine ratio less than 30 mg/g 09/14/2009    Qualifier: Diagnosis of  By: Moshe Cipro MD, Joycelyn Schmid    . Chronic renal disease, stage 3, moderately decreased glomerular filtration rate (GFR) between 30-59 mL/min/1.73 square meter 09/14/2009    Qualifier: Diagnosis of  By: Moshe Cipro MD, Joycelyn Schmid    . 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[place]); Hyperlipemia; Iron deficiency anemia; Depression with anxiety; CARPAL TUNNEL SYNDROME; GLAUCOMA; GERD; Chronic renal disease, stage 3, moderately decreased glomerular filtration rate (GFR) between 30-59 mL/min/1.73 square meter; DYSCHROMIA, UNSPECIFIED; DEGENERATIVE JOINT DISEASE, KNEE; SHOULDER PAIN; HIP PAIN, LEFT; KNEE PAIN; Backache; IMPINGEMENT SYNDROME; SLEEP APNEA; UNSPECIFIED URINARY INCONTINENCE; INGROWN TOENAIL; Constipation; Schatzki's ring; Essential hypertension, benign; Insomnia; Abnormal chest CT; Generalized osteoarthritis; Difficulty in walking(719.7);  Bilateral leg weakness; Macular hole of left eye; Osteopenia; Vitamin D deficiency; Myasthenia gravis ([place]); Myasthenia gravis in remission ([place]); Morbid obesity ([place]); and Anemia in chronic renal disease on her problem list.     has No Known Allergies.  Current Outpatient Prescriptions on File Prior to Visit  Medication Sig Dispense Refill  . amLODipine (NORVASC) 10 MG tablet TAKE 1 TABLET BY MOUTH ONCE A DAY. 30 tablet 0  . B-D ULTRAFINE III SHORT PEN 31G X 8 MM MISC USE ONCE  DAILY WITH LANTUS SOLOSTAR PEN. 100 each 2  . brimonidine-timolol (COMBIGAN) 0.2-0.5 % ophthalmic solution Place 1 drop into both eyes every 12 (twelve) hours.    . cetirizine (ZYRTEC) 10 MG tablet TAKE ONE TABLET BY MOUTH DAILY. 30 tablet 6  . citalopram (CELEXA) 40 MG tablet TAKE ONE TABLET BY MOUTH DAILY. 30 tablet 0  . clonazePAM (KLONOPIN) 0.5 MG tablet TAKE ONE TABLET BY MOUTH 3 TIMES DAILY AS NEEDED FOR ANXIETY. 90 tablet 2  . Cranberry 1000 MG CAPS Take 1,000 mg by mouth daily.     . cycloSPORINE (RESTASIS) 0.05 % ophthalmic emulsion Place 1 drop into both eyes 2 (two) times daily.     Marland Kitchen diltiazem (CARDIZEM CD) 240 MG 24 hr capsule TAKE (1) CAPSULE BY MOUTH TWICE DAILY. 60 capsule 0  . Flaxseed, Linseed, (FLAXSEED OIL) 1000 MG CAPS Take 1,000 mg by mouth daily.    . fluticasone (FLONASE) 50 MCG/ACT nasal spray Place 2 sprays into both nostrils daily as needed for allergies or rhinitis. 16 g 2  . gabapentin (NEURONTIN) 300 MG capsule TAKE 1 CAPSULE BY MOUTH THREE TIMES A DAY. 90 capsule 0  . glipiZIDE-metformin (METAGLIP) 2.5-500 MG tablet TAKE 1 TABLETS BY MOUTH TWICE A DAY BEFORE MEALS. 60 tablet 3  . hydrALAZINE (APRESOLINE) 25 MG tablet TAKE 1 TABLET BY MOUTH THREE TIMES A DAY. 90 tablet 0  . HYDROcodone-acetaminophen (NORCO) 10-325 MG tablet TAKE 1 TABLET BY MOUTH 3 TIMES DAILY. 90 tablet 0  . IRON PO Take 1 tablet by mouth 2 (two) times daily.    Marland Kitchen LANTUS SOLOSTAR 100 UNIT/ML Solostar Pen INJECT 30 UNITS SUBCUTANEOUSLY ONCE DAILY. 15 mL 2  . losartan (COZAAR) 100 MG tablet TAKE (1) TABLET BY MOUTH DAILY. 30 tablet 0  . Multiple Vitamin (MULTIVITAMIN WITH MINERALS) TABS tablet Take 1 tablet by mouth at bedtime.    Marland Kitchen omeprazole (PRILOSEC) 20 MG capsule TAKE ONE CAPSULE BY MOUTH DAILY. 30 capsule 0  . ondansetron (ZOFRAN) 4 MG tablet TAKE (1) TABLET BY MOUTH EVERY FOUR HOURS AS NEEDED FOR NAUSEA. 30 tablet 2  . ONETOUCH VERIO test strip Use with one touch verio machine 3x daily testing  100 each 1  . oxybutynin (DITROPAN-XL) 10 MG 24 hr tablet TAKE 1 TABLET BY MOUTH DAILY. 30 tablet 0  . polyethylene glycol powder (GLYCOLAX/MIRALAX) powder Take 17 g by mouth daily. 3350 g 1  . prednisoLONE acetate (PRED FORTE) 1 % ophthalmic suspension Place 1 drop into the left eye 4 (four) times daily. 5 mL 0  . predniSONE (DELTASONE) 5 MG tablet TAKE 1 TABLET BY MOUTH DAILY. 30 tablet 0  . PROAIR HFA 108 (90 BASE) MCG/ACT inhaler INHALE 2 PUFFS BY MOUTH EVERY 6 HOURS AS NEEDED . 8.5 g 3  . promethazine (PHENERGAN) 25 MG tablet Take 25 mg by mouth daily as needed for nausea.    Marland Kitchen pyridostigmine (MESTINON) 60 MG tablet TAKE (1) TABLET BY MOUTH THREE TIMES A DAY. 90 tablet  3  . rosuvastatin (CRESTOR) 40 MG tablet TAKE (1) TABLET BY MOUTH AT BEDTIME. 30 tablet 0  . spironolactone (ALDACTONE) 25 MG tablet TAKE 1 TABLET BY MOUTH ONCE A DAY. 30 tablet 0  . temazepam (RESTORIL) 30 MG capsule Take 30 mg by mouth at bedtime as needed for sleep.    . TRADJENTA 5 MG TABS tablet TAKE 1 TABLET BY MOUTH ONCE A DAY. 30 tablet 0  . venlafaxine XR (EFFEXOR-XR) 150 MG 24 hr capsule TAKE 1 CAPSULE BY MOUTH DAILY. 30 capsule 0  . Vitamin D, Ergocalciferol, (DRISDOL) 50000 units CAPS capsule TAKE 1 CAPSULE BY MOUTH ONCE A WEEK. 4 capsule 0   No current facility-administered medications on file prior to visit.    Past Surgical History  Procedure Laterality Date  . Total knee arthroplasty Right 05/13/07    Dr. Aline Brochure  . Carpal tunnel release Bilateral   . Incontinence surgery  08/26/09    [facility]  . Cholecystectomy    . Abdominal hysterectomy    . Eye surgery    . Esophagogastroduodenoscopy  02/08/11    [place]k-Distal esophageal erosion consistent with mild erosive reflux   esophagitis/ Noncritical Schatzki ring, small hiatal hernia otherwise upper/ gastrointestinal tract appeared unremarkable, status post passage  of a Maloney dilation to biopsy disruption of the ring described  . Colonoscopy  05/08/2012     [facility]:QRFXJOIT and external hemorrhoids; colonic diverticulosis  . Joint replacement      right total knee  . Appendectomy    . Tonsillectomy    . Cataract extraction w/phaco Left 10/21/2013    Procedure: LEFT CATARACT EXTRACTION PHACO AND INTRAOCULAR LENS PLACEMENT (IOC);  Surgeon: Marylynn Pearson, MD;  Location: [facility];  Service: Ophthalmology;  Laterality: Left;  . 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 Esophageal dilation      "more than 3 times" (01/19/2014)  . 25 gauge pars plana vitrectomy with 20 gauge mvr port Left 01/19/2014    Procedure: 25 GAUGE PARS PLANA VITRECTOMY WITH 20 GAUGE MVR PORT; MEMBRAME PEEL; SERUM PATCH; LASER TREATMENT; C3F8;  Surgeon: Hayden Pedro, MD;  Location: [facility];  Service: Ophthalmology;  Laterality: Left;  . Esophagogastroduodenoscopy  04/2012    2 tandem incomplete distal esophagea rings s/p dilation.   . Esophagogastroduodenoscopy N/A 09/16/2014    Procedure: ESOPHAGOGASTRODUODENOSCOPY (EGD);  Surgeon: Daneil Dolin, MD;  [facility];  Service: Endoscopy;  Laterality: N/A;  1200  . [place] dilation N/A 09/16/2014    Procedure: SAVORY DILATION;  Surgeon: Daneil Dolin, MD;  [facility];  Service: Endoscopy;  Laterality: N/A;  Venia Minks dilation N/A 09/16/2014    Procedure: Venia Minks DILATION;  Surgeon: Daneil Dolin, MD;  [facility];  Service: Endoscopy;  Laterality: N/A;    Denies any headaches, dizziness, double vision, fevers, chills, night sweats, nausea, vomiting, diarrhea, constipation, chest pain, heart palpitations, shortness of breath, blood in stool, black tarry stool, urinary pain, urinary burning, urinary frequency, hematuria.   PHYSICAL EXAMINATION  ECOG PERFORMANCE STATUS: 1 - Symptomatic but completely ambulatory  Filed Vitals:   02/02/16 1450  BP: 139/56  Pulse: 65  Temp: 98.6 F (37 C)  Resp: 16    GENERAL:alert, no distress, comfortable, cooperative, obese, smiling and  unaccompanied SKIN: skin color, texture, turgor are normal, no rashes or significant lesions HEAD: Normocephalic, No masses, lesions, tenderness or abnormalities EYES: normal, PERRLA, Conjunctiva are pink and non-injected EARS: External ears normal OROPHARYNX:lips,  buccal mucosa, and tongue normal and mucous membranes are moist  NECK: supple, trachea midline LYMPH:  not examined BREAST:not examined LUNGS: clear to auscultation  HEART: regular rate & rhythm with a crescendo 3/6 systolic ejection murmur auscultated throughout the precordium ABDOMEN:abdomen soft, non-tender and obese BACK: Back symmetric, no curvature. EXTREMITIES:less then 2 second capillary refill, no joint deformities, effusion, or inflammation, no skin discoloration, no clubbing, no cyanosis  NEURO: alert & oriented x 3 with fluent speech, no focal motor/sensory deficits, gait normal   LABORATORY DATA: CBC    Component Value Date/Time   WBC 7.3 01/30/2016 1107   RBC 3.47* 01/30/2016 1107   RBC 3.30* 12/28/2015 1410   HGB 10.1* 01/30/2016 1107   HCT 31.7* 01/30/2016 1107   PLT 281 01/30/2016 1107   MCV 91.4 01/30/2016 1107   MCH 29.1 01/30/2016 1107   MCHC 31.9 01/30/2016 1107   RDW 18.1* 01/30/2016 1107   LYMPHSABS 2.1 01/30/2016 1107   MONOABS 0.5 01/30/2016 1107   EOSABS 0.1 01/30/2016 1107   BASOSABS 0.0 01/30/2016 1107      Chemistry      Component Value Date/Time   NA 137 11/30/2015 1609   K 3.9 11/30/2015 1609   CL 104 11/30/2015 1609   CO2 25 11/30/2015 1609   BUN 32* 11/30/2015 1609   CREATININE 1.41* 11/30/2015 1609   CREATININE 0.99* 11/16/2015 1201      Component Value Date/Time   CALCIUM 9.4 11/30/2015 1609   ALKPHOS 138* 11/30/2015 1609   AST 14* 11/30/2015 1609   ALT 12* 11/30/2015 1609   BILITOT 0.4 11/30/2015 1609     Lab Results  Component Value Date   IRON 54 11/30/2015   TIBC 449 11/30/2015   FERRITIN 82 01/30/2016   Lab Results  Component Value Date   TSH 1.148  08/25/2015   Lab Results  Component Value Date   VITAMINB12 338 11/30/2015   Lab Results  Component Value Date   FOLATE 9.4 11/30/2015   Lab Results  Component Value Date   ESRSEDRATE 52* 11/30/2015   Lab Results  Component Value Date   PROT 7.8 11/30/2015   ALBUMINELP 3.4 11/30/2015   A1GS 0.3 11/30/2015   A2GS 0.8 11/30/2015   BETS 1.3 11/30/2015   GAMS 1.2 11/30/2015   MSPIKE Not Observed 11/30/2015   SPEI Comment 11/30/2015   SPECOM Comment 11/30/2015   IGGSERUM 1150 11/30/2015   IGGSERUM 1083 11/30/2015   IGA 240 11/30/2015   IGA 245 11/30/2015   IGMSERUM 108 11/30/2015   IGMSERUM 115 11/30/2015   KPAFRELGTCHN 30.88* 11/30/2015   LAMBDASER 22.14 11/30/2015   KAPLAMBRATIO 1.39 11/30/2015     PENDING LABS:   RADIOGRAPHIC STUDIES:  No results found.   PATHOLOGY:    ASSESSMENT AND PLAN:  Iron deficiency anemia Iron deficiency anemia in the setting of chronic renal disease, Stage III.  She likely has an element of anemia secondary to chronic renal disease.  She has required IV iron replacement with IV feraheme 510 mg x 2:  Oncology Flowsheet 12/05/2015 12/13/2015  ferumoxytol [facility]) IV 510 mg 510 mg   I personally reviewed and went over laboratory results with the patient.  The results are noted within this dictation.  Labs on 01/30/2016 demonstrate an iron deficit of ~430 mg.  I will get her set-up for another IV feraheme infusion x 1.  This is presently scheduled for 02/14/2016.  Given her continued iron deficiency 0.5 g/dL response thus far, we will continue to evaluate  her response to IV iron replacement.  Labs in 6 weeks: CBC diff, iron/TIBC, ferritin  I will add some hemolytic labs as well in 6 weeks given her aortic stenosis murmur: Haptoglobin, LDH    Return in 6 weeks for follow-up.  If RBC/HGB is not significantly improved, then we will proceed with ordering and scheduling of a bone marrow aspiration and biopsy. This can be reviewed in more  detail in 6 weeks if indicated.  She does have an aortic stenosis murmur.  I wonder if she has some mechanical, extracorpuscular hemolysis as a result.    THERAPY PLAN:  Monitor counts in 4-6 weeks.  If no significant improvement, will proceed with bone marrow aspiration and biopsy.  All questions were answered. The patient knows to call the clinic with any problems, questions or concerns. We can certainly see the patient much sooner if necessary.  Patient and plan discussed with Dr. Ancil Linsey and she is in agreement with the aforementioned.   This note is electronically signed by: Doy Mince 02/02/2016 5:54 PM

## 2016-02-14 ENCOUNTER — Encounter (HOSPITAL_BASED_OUTPATIENT_CLINIC_OR_DEPARTMENT_OTHER): Payer: Medicare Other

## 2016-02-14 ENCOUNTER — Encounter (HOSPITAL_COMMUNITY): Payer: Self-pay

## 2016-02-14 VITALS — BP 124/54 | HR 43 | Temp 98.1°F | Resp 18

## 2016-02-14 DIAGNOSIS — D509 Iron deficiency anemia, unspecified: Secondary | ICD-10-CM

## 2016-02-14 MED ORDER — SODIUM CHLORIDE 0.9 % IV SOLN
510.0000 mg | Freq: Once | INTRAVENOUS | Status: AC
  Administered 2016-02-14: 510 mg via INTRAVENOUS
  Filled 2016-02-14: qty 17

## 2016-02-14 MED ORDER — SODIUM CHLORIDE 0.9 % IV SOLN
INTRAVENOUS | Status: DC
  Administered 2016-02-14: 14:00:00 via INTRAVENOUS

## 2016-02-14 NOTE — Progress Notes (Signed)
Patient tolerated infusion well.  VSS.   

## 2016-02-14 NOTE — Patient Instructions (Signed)
[facility] at [facility] Discharge Instructions  RECOMMENDATIONS MADE BY THE CONSULTANT AND ANY TEST RESULTS WILL BE SENT TO YOUR REFERRING PHYSICIAN.  IV iron today.    Thank you for choosing [facility] at [facility] to provide your oncology and hematology care.  To afford each patient quality time with our provider, please arrive at least 15 minutes before your scheduled appointment time.   Beginning January 23rd 2017 lab work for the Cancer Center will be done in the  Main lab at [facility] on 1st floor. If you have a lab appointment with the Cancer Center please come in thru the  Main Entrance and check in at the main information desk  You need to re-schedule your appointment should you arrive 10 or more minutes late.  We strive to give you quality time with our providers, and arriving late affects you and other patients whose appointments are after yours.  Also, if you no show three or more times for appointments you may be dismissed from the clinic at the providers discretion.     Again, thank you for choosing [facility].  Our hope is that these requests will decrease the amount of time that you wait before being seen by our physicians.       _____________________________________________________________  Should you have questions after your visit to [name], please contact our office at [phone] between the hours of 8:30 a.m. and 4:30 p.m.  Voicemails left after 4:30 p.m. will not be returned until the following business day.  For prescription refill requests, have your pharmacy contact our office.     

## 2016-02-20 ENCOUNTER — Other Ambulatory Visit: Payer: Self-pay | Admitting: Family Medicine

## 2016-02-24 ENCOUNTER — Other Ambulatory Visit: Payer: Self-pay

## 2016-02-24 MED ORDER — HYDROCODONE-ACETAMINOPHEN 10-325 MG PO TABS: ORAL_TABLET | ORAL | Status: DC

## 2016-02-28 ENCOUNTER — Telehealth: Payer: Self-pay

## 2016-02-28 MED ORDER — BENZONATATE 100 MG PO CAPS: 100.0000 mg | ORAL_CAPSULE | Freq: Two times a day (BID) | ORAL | Status: DC | PRN

## 2016-02-28 NOTE — Telephone Encounter (Signed)
Tessalon perles sent, add twice daily saline flushes , call if worsens for appt to be seen

## 2016-02-29 MED ORDER — BENZONATATE 100 MG PO CAPS: 100.0000 mg | ORAL_CAPSULE | Freq: Two times a day (BID) | ORAL | Status: DC | PRN

## 2016-02-29 NOTE — Telephone Encounter (Signed)
Patient aware and medication switched to [facility] for cost effectiveness.

## 2016-02-29 NOTE — Addendum Note (Signed)
Addended by: Denman George B on: 02/29/2016 11:25 AM   Modules accepted: Orders

## 2016-03-13 ENCOUNTER — Encounter (HOSPITAL_COMMUNITY): Payer: Medicare Other | Attending: Hematology & Oncology

## 2016-03-13 ENCOUNTER — Other Ambulatory Visit: Payer: Self-pay

## 2016-03-13 DIAGNOSIS — D509 Iron deficiency anemia, unspecified: Secondary | ICD-10-CM | POA: Insufficient documentation

## 2016-03-13 DIAGNOSIS — D649 Anemia, unspecified: Secondary | ICD-10-CM | POA: Diagnosis not present

## 2016-03-13 LAB — IRON AND TIBC
IRON: 72 ug/dL (ref 28–170)
SATURATION RATIOS: 27 % (ref 10.4–31.8)
TIBC: 270 ug/dL (ref 250–450)
UIBC: 198 ug/dL

## 2016-03-13 LAB — CBC WITH DIFFERENTIAL/PLATELET
BASOS ABS: 0 10*3/uL (ref 0.0–0.1)
BASOS PCT: 0 %
Eosinophils Absolute: 0.2 10*3/uL (ref 0.0–0.7)
Eosinophils Relative: 3 %
HCT: 30.7 % — ABNORMAL LOW (ref 36.0–46.0)
HEMOGLOBIN: 9.9 g/dL — AB (ref 12.0–15.0)
Lymphocytes Relative: 25 %
Lymphs Abs: 1.9 10*3/uL (ref 0.7–4.0)
MCH: 28.9 pg (ref 26.0–34.0)
MCHC: 32.2 g/dL (ref 30.0–36.0)
MCV: 89.8 fL (ref 78.0–100.0)
MONO ABS: 0.6 10*3/uL (ref 0.1–1.0)
Monocytes Relative: 7 %
NEUTROS ABS: 5 10*3/uL (ref 1.7–7.7)
Neutrophils Relative %: 65 %
PLATELETS: 278 10*3/uL (ref 150–400)
RBC: 3.42 MIL/uL — ABNORMAL LOW (ref 3.87–5.11)
RDW: 17.7 % — ABNORMAL HIGH (ref 11.5–15.5)
WBC: 7.7 10*3/uL (ref 4.0–10.5)

## 2016-03-13 LAB — FERRITIN: FERRITIN: 128 ng/mL (ref 11–307)

## 2016-03-13 LAB — LACTATE DEHYDROGENASE: LDH: 198 U/L — ABNORMAL HIGH (ref 98–192)

## 2016-03-13 MED ORDER — HYDROCODONE-ACETAMINOPHEN 10-325 MG PO TABS: ORAL_TABLET | ORAL | Status: DC

## 2016-03-14 LAB — HAPTOGLOBIN: Haptoglobin: 162 mg/dL (ref 34–200)

## 2016-03-16 ENCOUNTER — Other Ambulatory Visit: Payer: Self-pay | Admitting: Family Medicine

## 2016-03-16 ENCOUNTER — Encounter (HOSPITAL_COMMUNITY): Payer: Self-pay | Admitting: Hematology & Oncology

## 2016-03-16 ENCOUNTER — Encounter (HOSPITAL_BASED_OUTPATIENT_CLINIC_OR_DEPARTMENT_OTHER): Payer: Medicare Other | Admitting: Hematology & Oncology

## 2016-03-16 ENCOUNTER — Encounter (HOSPITAL_COMMUNITY): Payer: Medicare Other

## 2016-03-16 VITALS — BP 176/65 | HR 58 | Temp 98.7°F | Resp 18 | Wt 244.0 lb

## 2016-03-16 DIAGNOSIS — D509 Iron deficiency anemia, unspecified: Secondary | ICD-10-CM

## 2016-03-16 DIAGNOSIS — D649 Anemia, unspecified: Secondary | ICD-10-CM | POA: Diagnosis not present

## 2016-03-16 LAB — COMPREHENSIVE METABOLIC PANEL
ALT: 16 U/L (ref 14–54)
AST: 15 U/L (ref 15–41)
Albumin: 3.7 g/dL (ref 3.5–5.0)
Alkaline Phosphatase: 112 U/L (ref 38–126)
Anion gap: 4 — ABNORMAL LOW (ref 5–15)
BILIRUBIN TOTAL: 0.3 mg/dL (ref 0.3–1.2)
BUN: 20 mg/dL (ref 6–20)
CO2: 28 mmol/L (ref 22–32)
CREATININE: 0.86 mg/dL (ref 0.44–1.00)
Calcium: 9.2 mg/dL (ref 8.9–10.3)
Chloride: 108 mmol/L (ref 101–111)
GFR calc Af Amer: >60 mL/min (ref >60)
GLUCOSE: 101 mg/dL — AB (ref 65–99)
Potassium: 5.2 mmol/L — ABNORMAL HIGH (ref 3.5–5.1)
Sodium: 140 mmol/L (ref 135–145)
TOTAL PROTEIN: 6.7 g/dL (ref 6.5–8.1)

## 2016-03-16 NOTE — Patient Instructions (Addendum)
[place] at [facility] Discharge Instructions  RECOMMENDATIONS MADE BY THE CONSULTANT AND ANY TEST RESULTS WILL BE SENT TO YOUR REFERRING PHYSICIAN.   Exam and discussion by Dr Whitney Muse today Lab work looked good.  Labs today, to check you kidney function We may call you back to tell you need a bone marrow biopsy (you can do this here at [facility] or at [facility]  We will call you with your follow up appt when your labs come back Please call the clinic if you have any questions or concerns    Thank you for choosing [place] at [facility] to provide your oncology and hematology care.  To afford each patient quality time with our provider, please arrive at least 15 minutes before your scheduled appointment time.   Beginning January 23rd 2017 lab work for the [facility] will be done in the  Main lab at [facility] on 1st floor. If you have a lab appointment with the [facility] please come in thru the  Main Entrance and check in at the main information desk  You need to re-schedule your appointment should you arrive 10 or more minutes late.  We strive to give you quality time with our providers, and arriving late affects you and other patients whose appointments are after yours.  Also, if you no show three or more times for appointments you may be dismissed from the clinic at the providers discretion.     Again, thank you for choosing [facility].  Our hope is that these requests will decrease the amount of time that you wait before being seen by our physicians.       _____________________________________________________________  Should you have questions after your visit to [facility], please contact our office at [phone] between the hours of 8:30 a.m. and 4:30 p.m.  Voicemails left after 4:30 p.m. will not be returned until the following business day.  For prescription refill requests, have your  pharmacy contact our office.         Resources For Cancer Patients and their Caregivers ? American Cancer Society: Can assist with transportation, wigs, general needs, runs Look Good Feel Better.        ([phone] ? Cancer Care: Provides financial assistance, online support groups, medication/co-pay assistance.  1-800-813-HOPE 252-062-7723) ? [place] Assists [place] cancer patients and their families through emotional , educational and financial support.  574-485-9436 ? [place] DSS Where to apply for food stamps, Medicaid and utility assistance. 2247557360 ? [facility]: Transportation to medical appointments. (978) 016-0992 ? Social Security Administration: May apply for disability if have a Stage IV cancer. 512-154-8703 952-293-1653 ? [employer], Disability and Transit Services: Assists with nutrition, care and transit needs. [phone]

## 2016-03-18 NOTE — Progress Notes (Signed)
[place] at [place] NOTE  Patient Care Team: Fayrene Helper, MD as PCP - General Daneil Dolin, MD (Gastroenterology) Marylynn Pearson, MD as Consulting Physician (Ophthalmology) Phillips Odor, MD as Consulting Physician (Neurology) Hayden Pedro, MD as Consulting Physician (Ophthalmology)  CHIEF COMPLAINTS/PURPOSE OF CONSULTATION:  Severe iron deficiency with serum ferritin of 8 ng/ml on 11/16/2015, 9 ng/ml on 01/26/2015 Hemoglobin of 8.9 g/dl on 11/16/2015 EGD on 09/16/2014 with Schatzki's ring and Hiatal hernia Colonoscopy on 05/08/2012 with internal and external hemorrhoids, colonic diverticulosis   HISTORY OF PRESENTING ILLNESS:  [name] 75 y.o. female is here because of severe iron deficiency. She is here alone today.  She has had three doses of IV feraheme. Denies any problems with the infusions.   [name] has a little bit of diverticulosis, but nothing else on her colonoscopy, which was only 2 years ago.  She used to take iron pills, and they made her very constipated. She feels unable to take them. She is here today for other recommendations in regards to managing her anemia.   She notes fatigue maybe some mild improvement. No dark tarry stool. Appetite is unchanged. No abdominal pain.   MEDICAL HISTORY:  Past Medical History  Diagnosis Date  . Hypertension   . Tobacco abuse   . Myasthenia gravis   . Gastroesophageal reflux   . Thyroid disease     "used to take RX; they took me off it" (01/19/2014)  . Iron deficiency anemia   . Depression   . Hyperlipidemia   . Obesity   . Carpal tunnel syndrome   . Shoulder pain   . Impingement syndrome, shoulder   . Pulmonary HTN ([place])   . Mitral regurgitation   . Schatzki's ring     Last EGD w/ dilation 02/08/11, 2009 & 2007  . Esophagitis, erosive 2009  . Diverticulosis 07/2004    Colonscopy Dr Gala Romney  . Heart murmur     saw cardiology In [place], he told her she did not need  to come back.  . Seasonal allergies   . Chronic bronchitis ([place])     "got it q yr for awhile; hasn't had it in awhile" (01/19/2014)  . Exertional shortness of breath   . OSA on CPAP   . Type II diabetes mellitus ([place])   . [id number]     "usually a couple times/wk" (01/19/2014)  . Arthritis     "all over" (01/19/2014)  . Anxiety   . Pneumonia 01/2012  . Myasthenia gravis in remission ([place]) 11/24/2014  . Chronic kidney disease (CKD) stage G3b/A1, moderately decreased glomerular filtration rate (GFR) between 30-44 mL/min/1.73 square meter and albuminuria creatinine ratio less than 30 mg/g 09/14/2009    Qualifier: Diagnosis of  By: Moshe Cipro MD, Joycelyn Schmid    . Chronic renal disease, stage 3, moderately decreased glomerular filtration rate (GFR) between 30-59 mL/min/1.73 square meter 09/14/2009    Qualifier: Diagnosis of  By: Moshe Cipro MD, Joycelyn Schmid    . Anemia in chronic renal disease 12/30/2015    SURGICAL HISTORY: Past Surgical History  Procedure Laterality Date  . Total knee arthroplasty Right 05/13/07    Dr. Aline Brochure  . Carpal tunnel release Bilateral   . Incontinence surgery  08/26/09    [place]  . Cholecystectomy    . Abdominal hysterectomy    . Eye surgery    . Esophagogastroduodenoscopy  02/08/11    Rourk-Distal esophageal erosion consistent with mild erosive reflux   esophagitis/ Noncritical  Schatzki ring, small hiatal hernia otherwise upper/ gastrointestinal tract appeared unremarkable, status post passage  of a Maloney dilation to biopsy disruption of the ring described  . Colonoscopy  05/08/2012    [id number] and external hemorrhoids; colonic diverticulosis  . Joint replacement      right total knee  . Appendectomy    . Tonsillectomy    . Cataract extraction w/phaco Left 10/21/2013    Procedure: LEFT CATARACT EXTRACTION PHACO AND INTRAOCULAR LENS PLACEMENT (IOC);  Surgeon: Marylynn Pearson, MD;  Location: [facility];  Service: Ophthalmology;  Laterality: Left;  . Pars plana  vitrectomy w/ repair of macular hole Left 01/19/2014  . Esophageal dilation      "more than 3 times" (01/19/2014)  . 25 gauge pars plana vitrectomy with 20 gauge mvr port Left 01/19/2014    Procedure: 25 GAUGE PARS PLANA VITRECTOMY WITH 20 GAUGE MVR PORT; MEMBRAME PEEL; SERUM PATCH; LASER TREATMENT; C3F8;  Surgeon: Hayden Pedro, MD;  Location: [facility];  Service: Ophthalmology;  Laterality: Left;  . Esophagogastroduodenoscopy  04/2012    2 tandem incomplete distal esophagea rings s/p dilation.   . Esophagogastroduodenoscopy N/A 09/16/2014    Procedure: ESOPHAGOGASTRODUODENOSCOPY (EGD);  Surgeon: Daneil Dolin, MD;  [facility];  Service: Endoscopy;  Laterality: N/A;  1200  . Savory dilation N/A 09/16/2014    Procedure: SAVORY DILATION;  Surgeon: Daneil Dolin, MD;  [facility];  Service: Endoscopy;  Laterality: N/A;  Venia Minks dilation N/A 09/16/2014    Procedure: Venia Minks DILATION;  Surgeon: Daneil Dolin, MD;  [facility];  Service: Endoscopy;  Laterality: N/A;    SOCIAL HISTORY: Social History   Social History  . Marital Status: Single    Spouse Name: N/A  . Number of Children: 2  . Years of Education: N/A   Occupational History  . disabled    Social History Main Topics  . Smoking status: Former Smoker -- 0.50 packs/day for 25 years    Types: Cigarettes    Quit date: 01/01/2004  . Smokeless tobacco: Never Used  . Alcohol Use: No  . Drug Use: No  . Sexual Activity: No   Other Topics Concern  . Not on file   Social History Narrative   Patient lives at home by herself   Patient drinks one cup of coffee a day   Patient is right handed.   She was born here in [place] on 01-08-1941 She is single; she has 2 children When asked if they're good, she said "I guess so." 4 grandsons; aged 35, 2, 1, 30.  She worked in 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 She never had any alcohol problems.  FAMILY HISTORY: Family History  Problem Relation Age of Onset  . Diabetes Son   . Hypertension Son   . Hypertension Daughter   . Cancer Mother   . Heart disease Mother   . Cancer Father   . Heart disease Father   . Colon cancer Neg Hx   . Diabetes Sister   . Cancer Sister   . Kidney failure Sister    Her mother and father are both deceased Her mother died from cancer in her 35's; "she had a big mole on the side of  her face." Her father died a long time ago, from blood pressure; stroke. "Blood pressure my daddy died from. Stroke."  Her oldest sister is 46, and Ellesse's the baby Her oldest sister is a Secretary/administrator; Genesa says "she's in better shape than I am to tell you the truth. I don't know how she do it." She says her sister still drives, makes cakes for people. There were 7 siblings total. None of the 5 that are gone died from cancer.  indicated that her mother is deceased. She indicated that her father is deceased. She indicated that only one of her three sisters is alive. She indicated that only one of her three brothers is alive.   ALLERGIES:  has No Known Allergies.  MEDICATIONS:  Current Outpatient Prescriptions  Medication Sig Dispense Refill  . amLODipine (NORVASC) 10 MG tablet TAKE 1 TABLET BY MOUTH ONCE A DAY. 30 tablet 0  . B-D ULTRAFINE III SHORT PEN 31G X 8 MM MISC USE ONCE DAILY WITH LANTUS SOLOSTAR PEN. 100 each 2  . benzonatate (TESSALON) 100 MG capsule Take 1 capsule (100 mg total) by mouth 2 (two) times daily as needed for cough. 14 capsule 0  . brimonidine-timolol (COMBIGAN) 0.2-0.5 % ophthalmic solution Place 1 drop into both eyes every 12 (twelve) hours.    . cetirizine (ZYRTEC) 10 MG tablet TAKE ONE TABLET BY MOUTH DAILY. 30 tablet 6  . citalopram (CELEXA) 40 MG tablet TAKE ONE TABLET BY MOUTH DAILY.  30 tablet 0  . clonazePAM (KLONOPIN) 0.5 MG tablet TAKE ONE TABLET BY MOUTH 3 TIMES DAILY AS NEEDED FOR ANXIETY. 90 tablet 2  . Cranberry 1000 MG CAPS Take 1,000 mg by mouth daily.     . cycloSPORINE (RESTASIS) 0.05 % ophthalmic emulsion Place 1 drop into both eyes 2 (two) times daily.     Marland Kitchen diltiazem (CARDIZEM CD) 240 MG 24 hr capsule TAKE (1) CAPSULE BY MOUTH TWICE DAILY. 60 capsule 0  . Flaxseed, Linseed, (FLAXSEED OIL) 1000 MG CAPS Take 1,000 mg by mouth daily.    . fluticasone (FLONASE) 50 MCG/ACT nasal spray Place 2 sprays into both nostrils daily as needed for allergies or rhinitis. 16 g 2  . gabapentin (NEURONTIN) 300 MG capsule TAKE 1 CAPSULE BY MOUTH THREE TIMES A DAY. 90 capsule 0  . glipiZIDE-metformin (METAGLIP) 2.5-500 MG tablet TAKE 1 TABLET BY MOUTH TWICE DAILY BEFORE MEALS. 60 tablet 3  . hydrALAZINE (APRESOLINE) 25 MG tablet TAKE 1 TABLET BY MOUTH THREE TIMES A DAY. 90 tablet 0  . HYDROcodone-acetaminophen (NORCO) 10-325 MG tablet TAKE 1 TABLET BY MOUTH 3 TIMES DAILY. 90 tablet 0  . IRON PO Take 1 tablet by mouth 2 (two) times daily.    Marland Kitchen LANTUS SOLOSTAR 100 UNIT/ML Solostar Pen INJECT 30 UNITS SUBCUTANEOUSLY ONCE DAILY. 15 mL 2  . losartan (COZAAR) 100 MG tablet TAKE (1) TABLET BY MOUTH DAILY. 30 tablet 0  . Multiple Vitamin (MULTIVITAMIN WITH MINERALS) TABS tablet Take 1 tablet by mouth at bedtime.    Marland Kitchen omeprazole (PRILOSEC) 20 MG capsule TAKE ONE CAPSULE BY MOUTH DAILY. 30 capsule 0  . ondansetron (ZOFRAN) 4 MG tablet TAKE (1) TABLET BY MOUTH EVERY FOUR HOURS AS NEEDED FOR NAUSEA. 30 tablet 2  . ONETOUCH VERIO test strip Use with one touch verio machine 3x daily testing 100 each 1  . oxybutynin (DITROPAN-XL) 10 MG 24 hr tablet TAKE 1 TABLET BY MOUTH DAILY. 30 tablet 0  . polyethylene glycol powder (GLYCOLAX/MIRALAX) powder Take 17 g  by mouth daily. 3350 g 1  . prednisoLONE acetate (PRED FORTE) 1 % ophthalmic suspension Place 1 drop into the left eye 4 (four) times daily. 5 mL  0  . predniSONE (DELTASONE) 5 MG tablet TAKE 1 TABLET BY MOUTH DAILY. 30 tablet 0  . PROAIR HFA 108 (90 BASE) MCG/ACT inhaler INHALE 2 PUFFS BY MOUTH EVERY 6 HOURS AS NEEDED . 8.5 g 3  . promethazine (PHENERGAN) 25 MG tablet Take 25 mg by mouth daily as needed for nausea.    Marland Kitchen pyridostigmine (MESTINON) 60 MG tablet TAKE (1) TABLET BY MOUTH THREE TIMES A DAY. 90 tablet 3  . rosuvastatin (CRESTOR) 40 MG tablet TAKE (1) TABLET BY MOUTH AT BEDTIME. 30 tablet 0  . spironolactone (ALDACTONE) 25 MG tablet TAKE 1 TABLET BY MOUTH ONCE A DAY. 30 tablet 0  . temazepam (RESTORIL) 30 MG capsule Take 30 mg by mouth at bedtime as needed for sleep.    . TRADJENTA 5 MG TABS tablet TAKE 1 TABLET BY MOUTH ONCE A DAY. 30 tablet 0  . venlafaxine XR (EFFEXOR-XR) 150 MG 24 hr capsule TAKE 1 CAPSULE BY MOUTH DAILY. 30 capsule 0  . Vitamin D, Ergocalciferol, (DRISDOL) 50000 units CAPS capsule TAKE 1 CAPSULE BY MOUTH ONCE A WEEK. 4 capsule 0   No current facility-administered medications for this visit.    Review of Systems  Constitutional: Positive for malaise/fatigue.  HENT: Negative.   Eyes: Negative.   Respiratory: Negative.   Cardiovascular: Negative.   Gastrointestinal: Negative.   Genitourinary: Negative.   Musculoskeletal: Negative.   Skin: Negative.   Neurological: Positive for weakness. Negative for dizziness, tingling and tremors.  Endo/Heme/Allergies: Negative.   Psychiatric/Behavioral: Negative.   All other systems reviewed and are negative.  14 point ROS was done and is otherwise as detailed above or in HPI   PHYSICAL EXAMINATION: ECOG PERFORMANCE STATUS: 1 - Symptomatic but completely ambulatory  Filed Vitals:   03/16/16 1301  BP: 176/65  Pulse: 58  Temp: 98.7 F (37.1 C)  Resp: 18   Filed Weights   03/16/16 1301  Weight: 244 lb (110.678 kg)    Physical Exam  Constitutional: She is oriented to person, place, and time and well-developed, well-nourished, and in no distress.    HENT:  Head: Normocephalic and atraumatic.  Nose: Nose normal.  Mouth/Throat: Oropharynx is clear and moist. No oropharyngeal exudate.  Eyes: Conjunctivae and EOM are normal. Pupils are equal, round, and reactive to light. Right eye exhibits no discharge. Left eye exhibits no discharge. No scleral icterus.  Neck: Normal range of motion. Neck supple. No tracheal deviation present. No thyromegaly present.  Cardiovascular: Normal rate, regular rhythm and normal heart sounds.  Exam reveals no gallop and no friction rub.   No murmur heard. Pulmonary/Chest: Effort normal and breath sounds normal. She has no wheezes. She has no rales.  Abdominal: Soft. Bowel sounds are normal. She exhibits no distension and no mass. There is no tenderness. There is no rebound and no guarding.  Musculoskeletal: Normal range of motion. She exhibits no edema.  Lymphadenopathy:    She has no cervical adenopathy.  Neurological: She is alert and oriented to person, place, and time. She has normal reflexes. No cranial nerve deficit. Gait normal. Coordination normal.  Skin: Skin is warm and dry. No rash noted.  Psychiatric: Mood, memory, affect and judgment normal.  Nursing note and vitals reviewed.   LABORATORY DATA:  I have reviewed the data as listed Lab Results  Component  Value Date   WBC 7.7 03/13/2016   HGB 9.9* 03/13/2016   HCT 30.7* 03/13/2016   MCV 89.8 03/13/2016   PLT 278 03/13/2016   CMP     Component Value Date/Time   NA 140 03/16/2016 1354   K 5.2* 03/16/2016 1354   CL 108 03/16/2016 1354   CO2 28 03/16/2016 1354   GLUCOSE 101* 03/16/2016 1354   BUN 20 03/16/2016 1354   CREATININE 0.86 03/16/2016 1354   CREATININE 0.99* 11/16/2015 1201   CALCIUM 9.2 03/16/2016 1354   PROT 6.7 03/16/2016 1354   ALBUMIN 3.7 03/16/2016 1354   AST 15 03/16/2016 1354   ALT 16 03/16/2016 1354   ALKPHOS 112 03/16/2016 1354   BILITOT 0.3 03/16/2016 1354   GFRNONAA >60 03/16/2016 1354   GFRNONAA 57* 11/16/2015  1201   GFRAA >60 03/16/2016 1354   GFRAA 66 11/16/2015 1201   Results for MAEDOT, CRABILL (MRN [id number])   Ref. Range 10/07/2007 21:29 04/08/2012 16:20 01/26/2015 15:57 11/16/2015 12:01 11/30/2015 16:09  Ferritin Latest Ref Range: 11-307 ng/mL 13 8 (L) 9 (L) 8 (L) 7 (L)   Results for MARISA, KINES (MRN [id number]) as of 03/18/2016 18:51  Ref. Range 01/19/2014 12:35 01/26/2015 15:57 08/25/2015 10:21 11/16/2015 12:01 11/30/2015 16:09 12/28/2015 14:10 01/30/2016 11:07 03/13/2016 11:01  Hemoglobin Latest Ref Range: 12.0-15.0 g/dL 9.5 (L) 9.1 (L) 9.5 (L) 8.9 (L) 9.5 (L) 9.5 (L) 10.1 (L) 9.9 (L)   Results for JASIMINE, GASIEWSKI (MRN [id number]) as of 03/18/2016 18:51  Ref. Range 04/15/2014 11:40 08/26/2014 14:28 01/26/2015 15:57 05/18/2015 14:10 08/25/2015 10:21 11/16/2015 12:01  GFR, Est African American Latest Ref Range: >=60 mL/min 74 >89 79 67 89 66    ASSESSMENT & PLAN:  Severe iron deficiency with serum ferritin of 8 ng/ml on 11/16/2015, 9 ng/ml on 01/26/2015 Hemoglobin of 8.9 g/dl on 11/16/2015 EGD on 09/16/2014 with Schatzki's ring and Hiatal hernia Colonoscopy on 05/08/2012 with internal and external hemorrhoids, colonic diverticulosis Normal B12, folate  We discussed causes of anemia, in that they are broad. She has obvious iron deficiency as manifested by her ferritin level. In chart review this is long standing.   She has received 3 doses of feraheme, ferritin is WNL but she remains anemic.    She is documented as having stage III CKD, but based upon GFR estimates from chart review, I do not have evidence of this.  I am repeating a CMP today.  At this point we may need to consider BMBX for additional evaluation.   Orders Placed This Encounter  Procedures  . Comprehensive metabolic panel    Standing Status: Future     Number of Occurrences: 1     Standing Expiration Date: 03/16/2017   All questions were answered. The patient knows to call the clinic with any problems, questions or  concerns.  This note was electronically signed.    Molli Hazard, MD  03/18/2016 6:48 PM

## 2016-03-19 ENCOUNTER — Encounter (HOSPITAL_COMMUNITY): Payer: Self-pay | Admitting: Lab

## 2016-03-19 ENCOUNTER — Other Ambulatory Visit (HOSPITAL_COMMUNITY): Payer: Self-pay | Admitting: Emergency Medicine

## 2016-03-19 NOTE — Progress Notes (Signed)
Referral sent to [facility].  Records faxed on 3/20 

## 2016-04-11 ENCOUNTER — Ambulatory Visit (INDEPENDENT_AMBULATORY_CARE_PROVIDER_SITE_OTHER): Payer: Medicare Other | Admitting: Family Medicine

## 2016-04-11 ENCOUNTER — Encounter: Payer: Self-pay | Admitting: Family Medicine

## 2016-04-11 VITALS — BP 138/70 | HR 76 | Resp 16 | Ht 66.0 in | Wt 247.0 lb

## 2016-04-11 DIAGNOSIS — L03116 Cellulitis of left lower limb: Secondary | ICD-10-CM | POA: Insufficient documentation

## 2016-04-11 DIAGNOSIS — E1121 Type 2 diabetes mellitus with diabetic nephropathy: Secondary | ICD-10-CM

## 2016-04-11 DIAGNOSIS — E1165 Type 2 diabetes mellitus with hyperglycemia: Secondary | ICD-10-CM

## 2016-04-11 DIAGNOSIS — R32 Unspecified urinary incontinence: Secondary | ICD-10-CM

## 2016-04-11 DIAGNOSIS — IMO0002 Reserved for concepts with insufficient information to code with codable children: Secondary | ICD-10-CM

## 2016-04-11 DIAGNOSIS — E785 Hyperlipidemia, unspecified: Secondary | ICD-10-CM

## 2016-04-11 DIAGNOSIS — Z0000 Encounter for general adult medical examination without abnormal findings: Secondary | ICD-10-CM | POA: Diagnosis not present

## 2016-04-11 DIAGNOSIS — J0100 Acute maxillary sinusitis, unspecified: Secondary | ICD-10-CM | POA: Diagnosis not present

## 2016-04-11 DIAGNOSIS — N39498 Other specified urinary incontinence: Secondary | ICD-10-CM

## 2016-04-11 MED ORDER — FLUTICASONE PROPIONATE 50 MCG/ACT NA SUSP: 2.0000 | Freq: Every day | NASAL | Status: DC

## 2016-04-11 MED ORDER — CEPHALEXIN 500 MG PO CAPS: 500.0000 mg | ORAL_CAPSULE | Freq: Two times a day (BID) | ORAL | Status: DC

## 2016-04-11 NOTE — Assessment & Plan Note (Signed)

## 2016-04-11 NOTE — Progress Notes (Signed)
Subjective:    Patient ID: [name], female    DOB: Aug 19, 1941, 75 y.o.   MRN: [id number]  HPI Preventive Screening-Counseling & Management   Patient present here today for a Medicare annual wellness visit. 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 No  Over the past two weeks, have you felt little interest or pleasure in doing things? No  Have you lost interest or pleasure in daily life? No  Do you often feel hopeless? No  Do you cry easily over simple problems? No   Activities of Daily Living  In your present state of health, do you have any difficulty performing the following activities?  Driving?: No Managing money?: No Feeding yourself?:No Getting from bed to chair?:No Climbing a flight of stairs?not many  Preparing food and eating?:No Bathing or showering?:No Getting dressed?:No Getting to the toilet?:No Using the toilet?:No Moving around from place to place?: has a stick that she uses to help her walk   Fall Risk Assessment In the past year have you fallen or had a near fall?: once  Are you currently taking any medications that make you dizzy?:No   Hearing  Difficulties: No Do you often ask people to speak up or repeat themselves?:No Do you experience ringing or noises in your ears?:No Do you have difficulty understanding soft or whispered voices?:No  Cognitive Testing  Alert? Yes Normal Appearance?Yes  Oriented to person? Yes Place? Yes  Time? Yes  Displays appropriate judgment?Yes  Can read the correct time from a watch face? yes Are you having problems remembering things?No  Advanced Directives have been discussed with the patient?Yes, brochure already given    List the Names of Other Physician/Practitioners you currently use:  [facility] (oncology)  [facility] (ortho)   Indicate any recent Medical Services you may have received from other than [facility] providers in the past year (date may be approximate).   Assessment:    Annual Wellness Exam   Plan:    During the course of the visit the patient was educated and counseled about appropriate screening and preventive services including:  A healthy diet is rich in fruit, vegetables and whole grains. Poultry fish, nuts and beans are a healthy choice for protein rather then red meat. A low sodium diet and drinking 64 ounces of water daily is generally recommended. Oils and sweet should be limited. Carbohydrates especially for those who are diabetic or overweight, should be limited to 30-45 gram per meal. It is important to eat on a regular schedule, at least 3 times daily. Snacks should be primarily fruits, vegetables or nuts. It is important that you exercise regularly at least 30 minutes 5 times  a week. If you develop chest pain, have severe difficulty breathing, or feel very tired, stop exercising immediately and seek medical attention  Immunization reviewed and updated. Cancer screening reviewed and updated    Patient Instructions (the written plan) was given to the patient.  Medicare Attestation  I have personally reviewed:  The patient's medical and social history  Their use of  alcohol, tobacco or illicit drugs  Their current medications and supplements  The patient's functional ability including ADLs,fall risks, home safety risks, cognitive, and hearing and visual impairment  Diet and physical activities  Evidence for depression or mood disorders  The patient's weight, height, BMI, and visual acuity have been recorded in the chart. I have made referrals, counseling, and provided education to the patient based on review of the above and I have provided the patient with a written personalized care plan for preventive services.      Review of Systems     Objective:   Physical Exam BP 140/70 mmHg  Pulse 76  Resp 16  Ht 5\' 6"  (1.676 m)  Wt 247 lb (112.038 kg)  BMI 39.89 kg/m2  SpO2 96%       Assessment & Plan:  Medicare annual wellness visit, subsequent Annual exam as documented. Counseling done  re healthy lifestyle involving commitment to 150 minutes exercise per week, heart healthy diet, and attaining healthy weight.The importance of adequate sleep also discussed. Regular seat belt use and home safety, is also discussed. Changes in health habits are decided on by the patient with goals and time frames  set for achieving them. Immunization and cancer screening needs are specifically addressed at this visit.   UNSPECIFIED URINARY INCONTINENCE Uncontrolled and worsening, negatively impacting her life. Stays home most of the time because of the problem. Has been to urology in the past, wants to have urology re evaluation  Sinusitis, acute maxillary Antibiotic prescribed, pt to commit to daily flonase and certrizine, also saline flushes  Cellulitis of leg, left Keflex prescribed, also recommended use of compression hose

## 2016-04-11 NOTE — Patient Instructions (Addendum)
Annual physical exam in 4 month  Fasting labs first week in May  Use pressure stocking and elevate legs to help with swelling  Flonase sent  For allergies and also keflex sent in for infected sinus and also for left leg   Topical rub from [employer] are referred to urology for uncontrolled incontinence Fall Prevention in the [place] can cause injuries. They can happen to people of all ages. There are many things you can do to make your home safe and to help prevent falls.  WHAT CAN I DO ON THE OUTSIDE OF MY HOME?  Regularly fix the edges of walkways and driveways and fix any cracks.  Remove anything that might make you trip as you walk through a door, such as a raised step or threshold.  Trim any bushes or trees on the path to your home.  Use bright outdoor lighting.  Clear any walking paths of anything that might make someone trip, such as rocks or tools.  Regularly check to see if handrails are loose or broken. Make sure that both sides of any steps have handrails.  Any raised decks and porches should have guardrails on the edges.  Have any leaves, snow, or ice cleared regularly.  Use sand or salt on walking paths during winter.  Clean up any spills in your garage right away. This includes oil or grease spills. WHAT CAN I DO IN THE BATHROOM?   Use night lights.  Install grab bars by the toilet and in the tub and shower. Do not use towel bars as grab bars.  Use non-skid mats or decals in the tub or shower.  If you need to sit down in the shower, use a plastic, non-slip stool.  Keep the floor dry. Clean up any water that spills on the floor as soon as it happens.  Remove soap buildup in the tub or shower regularly.  Attach bath mats securely with double-sided non-slip rug tape.  Do not have throw rugs and other things on the floor that can make you trip. WHAT CAN I DO IN THE BEDROOM?  Use night lights.  Make sure that you have a light by your  bed that is easy to reach.  Do not use any sheets or blankets that are too big for your bed. They should not hang down onto the floor.  Have a firm chair that has side arms. You can use this for support while you get dressed.  Do not have throw rugs and other things on the floor that can make you trip. WHAT CAN I DO IN THE KITCHEN?  Clean up any spills right away.  Avoid walking on wet floors.  Keep items that you use a lot in easy-to-reach places.  If you need to reach something above you, use a strong step stool that has a grab bar.  Keep electrical cords out of the way.  Do not use floor polish or wax that makes floors slippery. If you must use wax, use non-skid floor wax.  Do not have throw rugs and other things on the floor that can make you trip. WHAT CAN I DO WITH MY STAIRS?  Do not leave any items on the stairs.  Make sure that there are handrails on both sides of the stairs and use them. Fix handrails that are broken or loose. Make sure that handrails are as long as the stairways.  Check any carpeting to make sure that it is firmly attached to  the stairs. Fix any carpet that is loose or worn.  Avoid having throw rugs at the top or bottom of the stairs. If you do have throw rugs, attach them to the floor with carpet tape.  Make sure that you have a light switch at the top of the stairs and the bottom of the stairs. If you do not have them, ask someone to add them for you. WHAT ELSE CAN I DO TO HELP PREVENT FALLS?  Wear shoes that:  Do not have high heels.  Have rubber bottoms.  Are comfortable and fit you well.  Are closed at the toe. Do not wear sandals.  If you use a stepladder:  Make sure that it is fully opened. Do not climb a closed stepladder.  Make sure that both sides of the stepladder are locked into place.  Ask someone to hold it for you, if possible.  Clearly mark and make sure that you can see:  Any grab bars or handrails.  First and last  steps.  Where the edge of each step is.  Use tools that help you move around (mobility aids) if they are needed. These include:  Canes.  Walkers.  Scooters.  Crutches.  Turn on the lights when you go into a dark area. Replace any light bulbs as soon as they burn out.  Set up your furniture so you have a clear path. Avoid moving your furniture around.  If any of your floors are uneven, fix them.  If there are any pets around you, be aware of where they are.  Review your medicines with your doctor. Some medicines can make you feel dizzy. This can increase your chance of falling. Ask your doctor what other things that you can do to help prevent falls.   This information is not intended to replace advice given to you by your health care provider. Make sure you discuss any questions you have with your health care provider.   Document Released: 10/13/2009 Document Revised: 05/03/2015 Document Reviewed: 01/21/2015 Elsevier Interactive Patient Education 2016 [place] Prevention in the Home  Falls can cause injuries. They can happen to people of all ages. There are many things you can do to make your home safe and to help prevent falls.  WHAT CAN I DO ON THE OUTSIDE OF MY HOME?  Regularly fix the edges of walkways and driveways and fix any cracks.  Remove anything that might make you trip as you walk through a door, such as a raised step or threshold.  Trim any bushes or trees on the path to your home.  Use bright outdoor lighting.  Clear any walking paths of anything that might make someone trip, such as rocks or tools.  Regularly check to see if handrails are loose or broken. Make sure that both sides of any steps have handrails.  Any raised decks and porches should have guardrails on the edges.  Have any leaves, snow, or ice cleared regularly.  Use sand or salt on walking paths during winter.  Clean up any spills in your garage right away. This includes oil or  grease spills. WHAT CAN I DO IN THE BATHROOM?   Use night lights.  Install grab bars by the toilet and in the tub and shower. Do not use towel bars as grab bars.  Use non-skid mats or decals in the tub or shower.  If you need to sit down in the shower, use a plastic, non-slip stool.  Keep the floor  dry. Clean up any water that spills on the floor as soon as it happens.  Remove soap buildup in the tub or shower regularly.  Attach bath mats securely with double-sided non-slip rug tape.  Do not have throw rugs and other things on the floor that can make you trip. WHAT CAN I DO IN THE BEDROOM?  Use night lights.  Make sure that you have a light by your bed that is easy to reach.  Do not use any sheets or blankets that are too big for your bed. They should not hang down onto the floor.  Have a firm chair that has side arms. You can use this for support while you get dressed.  Do not have throw rugs and other things on the floor that can make you trip. WHAT CAN I DO IN THE KITCHEN?  Clean up any spills right away.  Avoid walking on wet floors.  Keep items that you use a lot in easy-to-reach places.  If you need to reach something above you, use a strong step stool that has a grab bar.  Keep electrical cords out of the way.  Do not use floor polish or wax that makes floors slippery. If you must use wax, use non-skid floor wax.  Do not have throw rugs and other things on the floor that can make you trip. WHAT CAN I DO WITH MY STAIRS?  Do not leave any items on the stairs.  Make sure that there are handrails on both sides of the stairs and use them. Fix handrails that are broken or loose. Make sure that handrails are as long as the stairways.  Check any carpeting to make sure that it is firmly attached to the stairs. Fix any carpet that is loose or worn.  Avoid having throw rugs at the top or bottom of the stairs. If you do have throw rugs, attach them to the floor with  carpet tape.  Make sure that you have a light switch at the top of the stairs and the bottom of the stairs. If you do not have them, ask someone to add them for you. WHAT ELSE CAN I DO TO HELP PREVENT FALLS?  Wear shoes that:  Do not have high heels.  Have rubber bottoms.  Are comfortable and fit you well.  Are closed at the toe. Do not wear sandals.  If you use a stepladder:  Make sure that it is fully opened. Do not climb a closed stepladder.  Make sure that both sides of the stepladder are locked into place.  Ask someone to hold it for you, if possible.  Clearly mark and make sure that you can see:  Any grab bars or handrails.  First and last steps.  Where the edge of each step is.  Use tools that help you move around (mobility aids) if they are needed. These include:  Canes.  Walkers.  Scooters.  Crutches.  Turn on the lights when you go into a dark area. Replace any light bulbs as soon as they burn out.  Set up your furniture so you have a clear path. Avoid moving your furniture around.  If any of your floors are uneven, fix them.  If there are any pets around you, be aware of where they are.  Review your medicines with your doctor. Some medicines can make you feel dizzy. This can increase your chance of falling. Ask your doctor what other things that you can do to help prevent  falls.   This information is not intended to replace advice given to you by your health care provider. Make sure you discuss any questions you have with your health care provider.   Document Released: 10/13/2009 Document Revised: 05/03/2015 Document Reviewed: 01/21/2015 Elsevier Interactive Patient Education [employer].

## 2016-04-12 NOTE — Assessment & Plan Note (Addendum)
Keflex prescribed, also recommended use of compression hose

## 2016-04-12 NOTE — Assessment & Plan Note (Signed)
Antibiotic prescribed, pt to commit to daily flonase and certrizine, also saline flushes

## 2016-04-12 NOTE — Assessment & Plan Note (Signed)
Uncontrolled and worsening, negatively impacting her life. Stays home most of the time because of the problem. Has been to urology in the past, wants to have urology re evaluation

## 2016-04-17 ENCOUNTER — Other Ambulatory Visit: Payer: Self-pay | Admitting: Family Medicine

## 2016-04-17 ENCOUNTER — Other Ambulatory Visit: Payer: Self-pay

## 2016-04-17 ENCOUNTER — Ambulatory Visit (HOSPITAL_COMMUNITY): Payer: Medicare Other | Admitting: Hematology & Oncology

## 2016-04-17 DIAGNOSIS — Z1231 Encounter for screening mammogram for malignant neoplasm of breast: Secondary | ICD-10-CM

## 2016-04-17 MED ORDER — HYDROCODONE-ACETAMINOPHEN 10-325 MG PO TABS: ORAL_TABLET | ORAL | Status: DC

## 2016-04-20 ENCOUNTER — Other Ambulatory Visit: Payer: Self-pay | Admitting: Family Medicine

## 2016-04-23 ENCOUNTER — Other Ambulatory Visit: Payer: Self-pay | Admitting: Family Medicine

## 2016-04-27 DIAGNOSIS — H35371 Puckering of macula, right eye: Secondary | ICD-10-CM | POA: Diagnosis not present

## 2016-04-27 DIAGNOSIS — H401132 Primary open-angle glaucoma, bilateral, moderate stage: Secondary | ICD-10-CM | POA: Diagnosis not present

## 2016-04-27 LAB — HM DIABETES EYE EXAM

## 2016-05-01 ENCOUNTER — Telehealth: Payer: Self-pay | Admitting: Family Medicine

## 2016-05-01 DIAGNOSIS — J0100 Acute maxillary sinusitis, unspecified: Secondary | ICD-10-CM

## 2016-05-01 MED ORDER — OXYBUTYNIN CHLORIDE ER 10 MG PO TB24: 10.0000 mg | ORAL_TABLET | Freq: Every day | ORAL | Status: DC

## 2016-05-01 MED ORDER — INSULIN GLARGINE 100 UNIT/ML SOLOSTAR PEN
PEN_INJECTOR | SUBCUTANEOUS | Status: DC
Start: 2016-05-01 — End: 2016-05-04

## 2016-05-01 MED ORDER — OMEPRAZOLE 20 MG PO CPDR: 20.0000 mg | DELAYED_RELEASE_CAPSULE | Freq: Every day | ORAL | Status: DC

## 2016-05-01 MED ORDER — GLUCOSE BLOOD VI STRP: ORAL_STRIP | Status: DC

## 2016-05-01 MED ORDER — CITALOPRAM HYDROBROMIDE 40 MG PO TABS: 40.0000 mg | ORAL_TABLET | Freq: Every day | ORAL | Status: DC

## 2016-05-01 MED ORDER — LOSARTAN POTASSIUM 100 MG PO TABS: ORAL_TABLET | ORAL | Status: DC

## 2016-05-01 MED ORDER — HYDRALAZINE HCL 25 MG PO TABS: 25.0000 mg | ORAL_TABLET | Freq: Three times a day (TID) | ORAL | Status: DC

## 2016-05-01 MED ORDER — VENLAFAXINE HCL ER 150 MG PO CP24: 150.0000 mg | ORAL_CAPSULE | Freq: Every day | ORAL | Status: DC

## 2016-05-01 MED ORDER — FLUTICASONE PROPIONATE 50 MCG/ACT NA SUSP: 2.0000 | Freq: Every day | NASAL | Status: DC

## 2016-05-01 MED ORDER — VITAMIN D (ERGOCALCIFEROL) 1.25 MG (50000 UNIT) PO CAPS: ORAL_CAPSULE | ORAL | Status: DC

## 2016-05-01 MED ORDER — CETIRIZINE HCL 10 MG PO TABS: 10.0000 mg | ORAL_TABLET | Freq: Every day | ORAL | Status: DC

## 2016-05-01 MED ORDER — SPIRONOLACTONE 25 MG PO TABS: 25.0000 mg | ORAL_TABLET | Freq: Every day | ORAL | Status: DC

## 2016-05-01 MED ORDER — ROSUVASTATIN CALCIUM 40 MG PO TABS: ORAL_TABLET | ORAL | Status: DC

## 2016-05-01 MED ORDER — PYRIDOSTIGMINE BROMIDE 60 MG PO TABS: ORAL_TABLET | ORAL | Status: DC

## 2016-05-01 MED ORDER — DILTIAZEM HCL ER COATED BEADS 240 MG PO CP24: ORAL_CAPSULE | ORAL | Status: DC

## 2016-05-01 MED ORDER — ONETOUCH DELICA LANCETS 33G MISC: Status: DC

## 2016-05-01 MED ORDER — AMLODIPINE BESYLATE 10 MG PO TABS: 10.0000 mg | ORAL_TABLET | Freq: Every day | ORAL | Status: DC

## 2016-05-01 MED ORDER — GABAPENTIN 300 MG PO CAPS: ORAL_CAPSULE | ORAL | Status: DC

## 2016-05-01 MED ORDER — GLIPIZIDE-METFORMIN HCL 2.5-500 MG PO TABS: ORAL_TABLET | ORAL | Status: DC

## 2016-05-01 MED ORDER — LINAGLIPTIN 5 MG PO TABS: 5.0000 mg | ORAL_TABLET | Freq: Every day | ORAL | Status: DC

## 2016-05-01 NOTE — Telephone Encounter (Signed)
meds changed to 90 day supply 

## 2016-05-01 NOTE — Telephone Encounter (Signed)
Patient is asking for all of her medications to be changed to a 90 day supply and sent to [facility], please advise?

## 2016-05-03 DIAGNOSIS — E1165 Type 2 diabetes mellitus with hyperglycemia: Secondary | ICD-10-CM | POA: Diagnosis not present

## 2016-05-03 DIAGNOSIS — E785 Hyperlipidemia, unspecified: Secondary | ICD-10-CM | POA: Diagnosis not present

## 2016-05-03 DIAGNOSIS — E1121 Type 2 diabetes mellitus with diabetic nephropathy: Secondary | ICD-10-CM | POA: Diagnosis not present

## 2016-05-04 ENCOUNTER — Other Ambulatory Visit: Payer: Self-pay | Admitting: Family Medicine

## 2016-05-04 LAB — HEMOGLOBIN A1C
Hgb A1c MFr Bld: 6.6 % — ABNORMAL HIGH (ref <5.7)
MEAN PLASMA GLUCOSE: 143 mg/dL

## 2016-05-04 LAB — LIPID PANEL
Cholesterol: 181 mg/dL (ref 125–200)
HDL: 59 mg/dL (ref >=46)
LDL Cholesterol: 108 mg/dL (ref <130)
TRIGLYCERIDES: 68 mg/dL (ref <150)
Total CHOL/HDL Ratio: 3.1 Ratio (ref <=5.0)
VLDL: 14 mg/dL (ref <30)

## 2016-05-07 ENCOUNTER — Telehealth: Payer: Self-pay

## 2016-05-07 DIAGNOSIS — E1121 Type 2 diabetes mellitus with diabetic nephropathy: Secondary | ICD-10-CM

## 2016-05-07 DIAGNOSIS — E1165 Type 2 diabetes mellitus with hyperglycemia: Principal | ICD-10-CM

## 2016-05-07 DIAGNOSIS — IMO0002 Reserved for concepts with insufficient information to code with codable children: Secondary | ICD-10-CM

## 2016-05-07 NOTE — Telephone Encounter (Signed)
Labs before next visit to be mailed

## 2016-05-08 NOTE — Progress Notes (Signed)
This encounter was created in error - please disregard.

## 2016-05-10 ENCOUNTER — Telehealth: Payer: Self-pay | Admitting: Family Medicine

## 2016-05-10 NOTE — Telephone Encounter (Signed)
Pt said that Dr. West Pugh office told her we need to fax over a request for treatment for her eye doctor

## 2016-05-10 NOTE — Telephone Encounter (Signed)
Not sure of what we were requesting from Dr. Zigmund Daniel?   We did receive office visit notes from Dr. Venetia Maxon.

## 2016-05-10 NOTE — Telephone Encounter (Signed)
Received eye exam report and entered in chart from [facility] on her retinopathy status

## 2016-05-11 ENCOUNTER — Ambulatory Visit (INDEPENDENT_AMBULATORY_CARE_PROVIDER_SITE_OTHER): Payer: Medicare Other | Admitting: Urology

## 2016-05-11 DIAGNOSIS — N3281 Overactive bladder: Secondary | ICD-10-CM

## 2016-05-11 DIAGNOSIS — N3941 Urge incontinence: Secondary | ICD-10-CM | POA: Diagnosis not present

## 2016-05-11 DIAGNOSIS — R351 Nocturia: Secondary | ICD-10-CM

## 2016-05-21 ENCOUNTER — Ambulatory Visit (HOSPITAL_COMMUNITY): Payer: Medicare Other

## 2016-05-29 ENCOUNTER — Other Ambulatory Visit: Payer: Self-pay

## 2016-05-29 MED ORDER — HYDROCODONE-ACETAMINOPHEN 10-325 MG PO TABS: ORAL_TABLET | ORAL | Status: DC

## 2016-05-30 ENCOUNTER — Telehealth: Payer: Self-pay

## 2016-05-30 NOTE — Telephone Encounter (Signed)
States her fasting blood sugars are still in the 60's after you have decreased her lantus to 15 units and metformin to 1 tab twice daily. Do you want to decrease further?

## 2016-05-30 NOTE — Telephone Encounter (Signed)
Reduce lantus to 5 units daily

## 2016-05-31 DIAGNOSIS — N3941 Urge incontinence: Secondary | ICD-10-CM | POA: Diagnosis not present

## 2016-05-31 DIAGNOSIS — R32 Unspecified urinary incontinence: Secondary | ICD-10-CM | POA: Diagnosis not present

## 2016-05-31 NOTE — Telephone Encounter (Signed)
Patient aware.

## 2016-06-05 ENCOUNTER — Ambulatory Visit (INDEPENDENT_AMBULATORY_CARE_PROVIDER_SITE_OTHER): Payer: Medicare Other | Admitting: Ophthalmology

## 2016-06-15 ENCOUNTER — Other Ambulatory Visit: Payer: Self-pay | Admitting: Family Medicine

## 2016-06-15 ENCOUNTER — Other Ambulatory Visit: Payer: Self-pay

## 2016-06-15 MED ORDER — HYDROCODONE-ACETAMINOPHEN 10-325 MG PO TABS: ORAL_TABLET | ORAL | Status: DC

## 2016-06-20 ENCOUNTER — Ambulatory Visit (INDEPENDENT_AMBULATORY_CARE_PROVIDER_SITE_OTHER): Payer: Medicare Other | Admitting: Urology

## 2016-06-20 DIAGNOSIS — R350 Frequency of micturition: Secondary | ICD-10-CM | POA: Diagnosis not present

## 2016-06-20 DIAGNOSIS — N3946 Mixed incontinence: Secondary | ICD-10-CM

## 2016-06-20 DIAGNOSIS — R3915 Urgency of urination: Secondary | ICD-10-CM | POA: Diagnosis not present

## 2016-07-12 ENCOUNTER — Other Ambulatory Visit: Payer: Self-pay

## 2016-07-12 MED ORDER — HYDROCODONE-ACETAMINOPHEN 10-325 MG PO TABS: ORAL_TABLET | ORAL | Status: DC

## 2016-07-16 ENCOUNTER — Other Ambulatory Visit: Payer: Self-pay | Admitting: Family Medicine

## 2016-07-25 ENCOUNTER — Ambulatory Visit (INDEPENDENT_AMBULATORY_CARE_PROVIDER_SITE_OTHER): Payer: Medicare Other | Admitting: Ophthalmology

## 2016-07-25 DIAGNOSIS — H33302 Unspecified retinal break, left eye: Secondary | ICD-10-CM | POA: Diagnosis not present

## 2016-07-25 DIAGNOSIS — E11311 Type 2 diabetes mellitus with unspecified diabetic retinopathy with macular edema: Secondary | ICD-10-CM | POA: Diagnosis not present

## 2016-07-25 DIAGNOSIS — H43813 Vitreous degeneration, bilateral: Secondary | ICD-10-CM | POA: Diagnosis not present

## 2016-07-25 DIAGNOSIS — E113292 Type 2 diabetes mellitus with mild nonproliferative diabetic retinopathy without macular edema, left eye: Secondary | ICD-10-CM | POA: Diagnosis not present

## 2016-07-25 DIAGNOSIS — H35342 Macular cyst, hole, or pseudohole, left eye: Secondary | ICD-10-CM | POA: Diagnosis not present

## 2016-07-25 DIAGNOSIS — H35033 Hypertensive retinopathy, bilateral: Secondary | ICD-10-CM | POA: Diagnosis not present

## 2016-07-25 DIAGNOSIS — E113311 Type 2 diabetes mellitus with moderate nonproliferative diabetic retinopathy with macular edema, right eye: Secondary | ICD-10-CM | POA: Diagnosis not present

## 2016-07-25 DIAGNOSIS — I10 Essential (primary) hypertension: Secondary | ICD-10-CM

## 2016-07-25 DIAGNOSIS — H34831 Tributary (branch) retinal vein occlusion, right eye, with macular edema: Secondary | ICD-10-CM | POA: Diagnosis not present

## 2016-07-30 ENCOUNTER — Other Ambulatory Visit: Payer: Self-pay | Admitting: Family Medicine

## 2016-07-31 DIAGNOSIS — N3946 Mixed incontinence: Secondary | ICD-10-CM | POA: Diagnosis not present

## 2016-07-31 DIAGNOSIS — R350 Frequency of micturition: Secondary | ICD-10-CM | POA: Diagnosis not present

## 2016-08-15 ENCOUNTER — Encounter: Payer: Self-pay | Admitting: Family Medicine

## 2016-08-15 ENCOUNTER — Other Ambulatory Visit (HOSPITAL_COMMUNITY)
Admission: RE | Admit: 2016-08-15 | Discharge: 2016-08-15 | Disposition: A | Discharge status: Home / Self Care | Payer: Medicare Other | Source: Ambulatory Visit | Attending: Family Medicine | Admitting: Family Medicine

## 2016-08-15 ENCOUNTER — Ambulatory Visit (INDEPENDENT_AMBULATORY_CARE_PROVIDER_SITE_OTHER): Payer: Medicare Other | Admitting: Family Medicine

## 2016-08-15 ENCOUNTER — Other Ambulatory Visit: Payer: Self-pay | Admitting: Family Medicine

## 2016-08-15 VITALS — BP 146/70 | HR 62 | Resp 18 | Ht 66.0 in | Wt 253.0 lb

## 2016-08-15 DIAGNOSIS — K222 Esophageal obstruction: Secondary | ICD-10-CM

## 2016-08-15 DIAGNOSIS — IMO0002 Reserved for concepts with insufficient information to code with codable children: Secondary | ICD-10-CM

## 2016-08-15 DIAGNOSIS — E1121 Type 2 diabetes mellitus with diabetic nephropathy: Secondary | ICD-10-CM

## 2016-08-15 DIAGNOSIS — M179 Osteoarthritis of knee, unspecified: Secondary | ICD-10-CM | POA: Diagnosis not present

## 2016-08-15 DIAGNOSIS — Z0000 Encounter for general adult medical examination without abnormal findings: Secondary | ICD-10-CM | POA: Diagnosis not present

## 2016-08-15 DIAGNOSIS — E785 Hyperlipidemia, unspecified: Secondary | ICD-10-CM

## 2016-08-15 DIAGNOSIS — Q394 Esophageal web: Secondary | ICD-10-CM

## 2016-08-15 DIAGNOSIS — Z124 Encounter for screening for malignant neoplasm of cervix: Secondary | ICD-10-CM | POA: Diagnosis not present

## 2016-08-15 DIAGNOSIS — M25571 Pain in right ankle and joints of right foot: Secondary | ICD-10-CM | POA: Insufficient documentation

## 2016-08-15 DIAGNOSIS — I10 Essential (primary) hypertension: Secondary | ICD-10-CM

## 2016-08-15 DIAGNOSIS — E1165 Type 2 diabetes mellitus with hyperglycemia: Secondary | ICD-10-CM | POA: Diagnosis not present

## 2016-08-15 DIAGNOSIS — Z01419 Encounter for gynecological examination (general) (routine) without abnormal findings: Secondary | ICD-10-CM | POA: Insufficient documentation

## 2016-08-15 DIAGNOSIS — E032 Hypothyroidism due to medicaments and other exogenous substances: Secondary | ICD-10-CM | POA: Diagnosis not present

## 2016-08-15 DIAGNOSIS — Z1211 Encounter for screening for malignant neoplasm of colon: Secondary | ICD-10-CM | POA: Diagnosis not present

## 2016-08-15 LAB — COMPLETE METABOLIC PANEL WITH GFR
ALBUMIN: 3.8 g/dL (ref 3.6–5.1)
ALK PHOS: 138 U/L — AB (ref 33–130)
ALT: 13 U/L (ref 6–29)
AST: 15 U/L (ref 10–35)
BILIRUBIN TOTAL: 0.5 mg/dL (ref 0.2–1.2)
BUN: 16 mg/dL (ref 7–25)
CALCIUM: 8.9 mg/dL (ref 8.6–10.4)
CO2: 28 mmol/L (ref 20–31)
Chloride: 108 mmol/L (ref 98–110)
Creat: 0.93 mg/dL (ref 0.60–0.93)
GFR, EST AFRICAN AMERICAN: 71 mL/min (ref >=60)
GFR, EST NON AFRICAN AMERICAN: 61 mL/min (ref >=60)
Glucose, Bld: 92 mg/dL (ref 65–99)
Potassium: 4.4 mmol/L (ref 3.5–5.3)
Sodium: 141 mmol/L (ref 135–146)
TOTAL PROTEIN: 6.3 g/dL (ref 6.1–8.1)

## 2016-08-15 LAB — POC HEMOCCULT BLD/STL (OFFICE/1-CARD/DIAGNOSTIC): Fecal Occult Blood, POC: NEGATIVE

## 2016-08-15 LAB — TSH: TSH: 0.9 m[IU]/L

## 2016-08-15 NOTE — Assessment & Plan Note (Signed)

## 2016-08-15 NOTE — Assessment & Plan Note (Addendum)
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

## 2016-08-15 NOTE — Assessment & Plan Note (Addendum)
dyspahgia x 2 to 3 month, established GERD, refer GI for eval and mx, will benefit from dilatation which she has had in the past

## 2016-08-15 NOTE — Patient Instructions (Signed)
F/u in December call if you need me today  Flu vaccine available in September , walk in basiss  Non fasting Labs today  Fasting lipid, cmp and eGFr and hBA1C for December visit  You are referred to Dr Aline Brochure re right ankle and to Dr Sydell Axon re difficulty swallowing  STOP once weekly vit D due to cost, start once daily vit D3 800 IU this is OTC  Thank you  for choosing [facility]. We consider it a privelige to serve you.  Delivering excellent health care in a caring and  compassionate way is our goal.  Partnering with you,  so that together we can achieve this goal is our strategy.

## 2016-08-16 ENCOUNTER — Encounter: Payer: Self-pay | Admitting: Internal Medicine

## 2016-08-16 LAB — HEMOGLOBIN A1C
HEMOGLOBIN A1C: 6.7 % — AB (ref <5.7)
Mean Plasma Glucose: 146 mg/dL

## 2016-08-17 ENCOUNTER — Other Ambulatory Visit: Payer: Self-pay

## 2016-08-17 MED ORDER — HYDROCODONE-ACETAMINOPHEN 10-325 MG PO TABS: ORAL_TABLET | ORAL | 0 refills | Status: DC

## 2016-08-18 ENCOUNTER — Encounter: Payer: Self-pay | Admitting: Family Medicine

## 2016-08-18 NOTE — Progress Notes (Signed)
    [name]     MRN: [id number]      DOB: 1941-03-09  HPI: Patient is in for annual physical exam. 6 month h/o right ankle pain , stiffness and deformity, no inciting trauma 6 week h/o increased difficulty swallowing with food sticking , has benefited in the past from esophageal dilatation , will refer for GI eval, she is compliant with reflux meds Recent labs, if available are reviewed. Labs are reviewed   PE: Pleasant  female, alert and oriented x 3, in no cardio-pulmonary distress. Afebrile. HEENT No facial trauma or asymetry. Sinuses non tender.  Extra occullar muscles intact, pupils equally reactive to light. External ears normal, tympanic membranes clear. Oropharynx moist, no exudate. Neck: supple, no adenopathy,JVD or thyromegaly.No bruits.  Chest: Clear to ascultation bilaterally.No crackles or wheezes. Non tender to palpation  Breast: No asymetry,no masses or lumps. No tenderness. No nipple discharge or inversion. No axillary or supraclavicular adenopathy  Cardiovascular system; Heart sounds normal,  S1 and  S2 ,no S3.  No murmur, or thrill. Apical beat not displaced Peripheral pulses normal.  Abdomen: Soft, non tender, no organomegaly or masses. No bruits. Bowel sounds normal. No guarding, tenderness or rebound.  Rectal:  Normal sphincter tone. No rectal mass. Guaiac negative stool.  GU: External genitalia normal female genitalia , normal female distribution of hair. No lesions. Urethral meatus normal in size, no  Prolapse, no lesions visibly  Present. Bladder non tender. Vagina pink and moist , with no visible lesions , discharge present . Adequate pelvic support no  cystocele or rectocele noted  Uterus absent, no adnexal masses, no  adnexal tenderness.   Musculoskeletal exam: Decreased  ROM of spine, hips , shoulders and knees. deformity ,swelling and  crepitus noted.Right ankle deformed and swollen with decreased ROM No muscle wasting or  atrophy.   Neurologic: Cranial nerves 2 to 12 intact. Power, tone ,sensation and reflexes normal throughout. No disturbance in gait. No tremor.  Skin: Intact, no ulceration, erythema , scaling or rash noted. Pigmentation normal throughout  Psych; Normal mood and affect. Judgement and concentration normal   Assessment & Plan:   Annual physical exam Annual exam as documented. Counseling done  re healthy lifestyle involving commitment to 150 minutes exercise per week, heart healthy diet, and attaining healthy weight.The importance of adequate sleep also discussed. Regular seat belt use and home safety, is also discussed. Changes in health habits are decided on by the patient with goals and time frames  set for achieving them. Immunization and cancer screening needs are specifically addressed at this visit.   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

## 2016-08-20 LAB — CYTOLOGY - PAP

## 2016-08-31 ENCOUNTER — Encounter: Payer: Self-pay | Admitting: Orthopedic Surgery

## 2016-08-31 ENCOUNTER — Ambulatory Visit (INDEPENDENT_AMBULATORY_CARE_PROVIDER_SITE_OTHER): Payer: Medicare Other | Admitting: Orthopedic Surgery

## 2016-08-31 ENCOUNTER — Ambulatory Visit (INDEPENDENT_AMBULATORY_CARE_PROVIDER_SITE_OTHER): Payer: Medicare Other

## 2016-08-31 VITALS — BP 150/61 | HR 51 | Ht 66.0 in | Wt 248.0 lb

## 2016-08-31 DIAGNOSIS — M6789 Other specified disorders of synovium and tendon, multiple sites: Secondary | ICD-10-CM | POA: Diagnosis not present

## 2016-08-31 DIAGNOSIS — M25571 Pain in right ankle and joints of right foot: Secondary | ICD-10-CM

## 2016-08-31 DIAGNOSIS — M76829 Posterior tibial tendinitis, unspecified leg: Secondary | ICD-10-CM

## 2016-08-31 NOTE — Progress Notes (Addendum)
Chief Complaint  Patient presents with  . Ankle Pain    RIGHT ANKLE PAIN AND SWELLING   HPI  75 Years old female presents with 2 year history of pain and swelling medial side of the right ankle and also notes that the foot is rolling in No prior treatment Dull ache foot seems to give way pain radiates to the knee  Review of Systems  Constitutional: Positive for malaise/fatigue.  HENT: Positive for congestion.   Eyes: Positive for blurred vision.  Respiratory: Positive for shortness of breath.   Cardiovascular: Positive for leg swelling.  Gastrointestinal: Positive for nausea and vomiting.  Genitourinary: Positive for frequency and urgency.  Neurological: Positive for dizziness, tingling and sensory change.  Psychiatric/Behavioral: Positive for depression. The patient is nervous/anxious.     Past Medical History:  Diagnosis Date  . Anemia in chronic renal disease 12/30/2015  . Anxiety   . Arthritis    "all over" (01/19/2014)  . Carpal tunnel syndrome   . Chronic bronchitis ([place])    "got it q yr for awhile; hasn't had it in awhile" (01/19/2014)  . Chronic kidney disease (CKD) stage G3b/A1, moderately decreased glomerular filtration rate (GFR) between 30-44 mL/min/1.73 square meter and albuminuria creatinine ratio less than 30 mg/g 09/14/2009   Qualifier: Diagnosis of  By: Moshe Cipro MD, Joycelyn Schmid    . Chronic renal disease, stage 3, moderately decreased glomerular filtration rate (GFR) between 30-59 mL/min/1.73 square meter 09/14/2009   Qualifier: Diagnosis of  By: Moshe Cipro MD, Joycelyn Schmid    . Depression   . Diverticulosis 07/2004   Colonscopy Dr Gala Romney  . Esophagitis, erosive 2009  . Exertional shortness of breath   . Gastroesophageal reflux   . [id number]    "usually a couple times/wk" (01/19/2014)  . Heart murmur    saw cardiology In [place], he told her she did not need to come back.  . Hyperlipidemia   . Hypertension   . Impingement syndrome, shoulder   . Iron deficiency  anemia   . Mitral regurgitation   . Myasthenia gravis   . Myasthenia gravis in remission ([place]) 11/24/2014  . Obesity   . OSA on CPAP   . Pneumonia 01/2012  . Pulmonary HTN ([place])   . Schatzki's ring    Last EGD w/ dilation 02/08/11, 2009 & 2007  . Seasonal allergies   . Shoulder pain   . Thyroid disease    "used to take RX; they took me off it" (01/19/2014)  . Tobacco abuse   . Type II diabetes mellitus ([place])     Past Surgical History:  Procedure Laterality Date  . [place] VITRECTOMY WITH 20 GAUGE MVR PORT Left 01/19/2014   Procedure: 25 GAUGE PARS PLANA VITRECTOMY WITH 20 GAUGE MVR PORT; MEMBRAME PEEL; SERUM PATCH; LASER TREATMENT; C3F8;  Surgeon: Hayden Pedro, MD;  Location: [facility];  Service: Ophthalmology;  Laterality: Left;  . ABDOMINAL HYSTERECTOMY    . APPENDECTOMY    . CARPAL TUNNEL RELEASE Bilateral   . CATARACT EXTRACTION W/PHACO Left 10/21/2013   Procedure: LEFT CATARACT EXTRACTION PHACO AND INTRAOCULAR LENS PLACEMENT (IOC);  Surgeon: Marylynn Pearson, MD;  Location: [facility];  Service: Ophthalmology;  Laterality: Left;  . CHOLECYSTECTOMY    . COLONOSCOPY  05/08/2012   [id number] and external hemorrhoids; colonic diverticulosis  . ESOPHAGEAL DILATION     "more than 3 times" (01/19/2014)  . ESOPHAGOGASTRODUODENOSCOPY  02/08/11   Rourk-Distal esophageal erosion consistent with mild erosive reflux   esophagitis/ Noncritical Schatzki ring,  small hiatal hernia otherwise upper/ gastrointestinal tract appeared unremarkable, status post passage  of a Maloney dilation to biopsy disruption of the ring described  . ESOPHAGOGASTRODUODENOSCOPY  04/2012   2 tandem incomplete distal esophagea rings s/p dilation.   . ESOPHAGOGASTRODUODENOSCOPY N/A 09/16/2014   Procedure: ESOPHAGOGASTRODUODENOSCOPY (EGD);  Surgeon: Daneil Dolin, MD;  [facility];  Service: Endoscopy;  Laterality: N/A;  1200  . EYE SURGERY    . INCONTINENCE SURGERY  08/26/09   Tananbaum  . JOINT REPLACEMENT      right total knee  . MALONEY DILATION N/A 09/16/2014   Procedure: Venia Minks DILATION;  Surgeon: Daneil Dolin, MD;  [facility];  Service: Endoscopy;  Laterality: N/A;  . PARS PLANA VITRECTOMY W/ REPAIR OF MACULAR HOLE Left 01/19/2014  . SAVORY DILATION N/A 09/16/2014   Procedure: SAVORY DILATION;  Surgeon: Daneil Dolin, MD;  [facility];  Service: Endoscopy;  Laterality: N/A;  . TONSILLECTOMY    . TOTAL KNEE ARTHROPLASTY Right 05/13/07   Dr. Aline Brochure   Family History  Problem Relation Age of Onset  . Diabetes Son   . Hypertension Son   . Hypertension Daughter   . Cancer Mother   . Heart disease Mother   . Cancer Father   . Heart disease Father   . Colon cancer Neg Hx   . Diabetes Sister   . Cancer Sister   . Kidney failure Sister    Social History  Substance Use Topics  . Smoking status: Former Smoker    Packs/day: 0.50    Years: 25.00    Types: Cigarettes    Quit date: 01/01/2004  . Smokeless tobacco: Never Used  . Alcohol use No    Current Outpatient Prescriptions:  .  amLODipine (NORVASC) 10 MG tablet, TAKE 1 TABLET BY MOUTH ONCE A DAY., Disp: 30 tablet, Rfl: 3 .  B-D ULTRAFINE III SHORT PEN 31G X 8 MM MISC, USE ONCE DAILY WITH LANTUS SOLOSTAR PEN., Disp: 100 each, Rfl: 5 .  brimonidine-timolol (COMBIGAN) 0.2-0.5 % ophthalmic solution, Place 1 drop into both eyes every 12 (twelve) hours., Disp: , Rfl:  .  cetirizine (ZYRTEC) 10 MG tablet, Take 1 tablet (10 mg total) by mouth daily., Disp: 90 tablet, Rfl: 1 .  citalopram (CELEXA) 40 MG tablet, TAKE ONE TABLET BY MOUTH DAILY., Disp: 30 tablet, Rfl: 3 .  clonazePAM (KLONOPIN) 0.5 MG tablet, TAKE ONE TABLET BY MOUTH 3 TIMES DAILY AS NEEDED FOR ANXIETY., Disp: 90 tablet, Rfl: 2 .  Cranberry 1000 MG CAPS, Take 1,000 mg by mouth daily. , Disp: , Rfl:  .  cycloSPORINE (RESTASIS) 0.05 % ophthalmic emulsion, Place 1 drop into both eyes 2 (two) times daily. , Disp: , Rfl:  .  diltiazem (CARDIZEM CD) 240 MG  24 hr capsule, TAKE (1) CAPSULE BY MOUTH TWICE DAILY., Disp: 60 capsule, Rfl: 3 .  Flaxseed, Linseed, (FLAXSEED OIL) 1000 MG CAPS, Take 1,000 mg by mouth daily., Disp: , Rfl:  .  fluticasone (FLONASE) 50 MCG/ACT nasal spray, Place 2 sprays into both nostrils daily., Disp: 48 g, Rfl: 1 .  gabapentin (NEURONTIN) 300 MG capsule, TAKE 1 CAPSULE BY MOUTH THREE TIMES A DAY., Disp: 90 capsule, Rfl: 3 .  glipiZIDE-metformin (METAGLIP) 2.5-500 MG tablet, TAKE 1 TABLET BY MOUTH TWICE DAILY BEFORE MEALS., Disp: 60 tablet, Rfl: 3 .  glucose blood (ONETOUCH VERIO) test strip, USE AS DIRECTED TO TEST BLOOD GLUCOSE 3 TIMES DAILY., Disp: 300 each, Rfl: 1 .  hydrALAZINE (APRESOLINE) 25 MG tablet, TAKE 1 TABLET BY MOUTH THREE TIMES A DAY., Disp: 90 tablet, Rfl: 3 .  hydrALAZINE (APRESOLINE) 25 MG tablet, TAKE 1 TABLET BY MOUTH THREE TIMES A DAY., Disp: 90 tablet, Rfl: 3 .  HYDROcodone-acetaminophen (NORCO) 10-325 MG tablet, TAKE 1 TABLET BY MOUTH 3 TIMES DAILY., Disp: 90 tablet, Rfl: 0 .  Insulin Glargine (LANTUS SOLOSTAR) 100 UNIT/ML Solostar Pen, 15 units daily, reduced dose effective 05/04/2016, Disp: 5 pen, Rfl: PRN .  IRON PO, Take 1 tablet by mouth 2 (two) times daily., Disp: , Rfl:  .  losartan (COZAAR) 100 MG tablet, TAKE (1) TABLET BY MOUTH DAILY., Disp: 30 tablet, Rfl: 3 .  Multiple Vitamin (MULTIVITAMIN WITH MINERALS) TABS tablet, Take 1 tablet by mouth at bedtime., Disp: , Rfl:  .  omeprazole (PRILOSEC) 20 MG capsule, TAKE ONE CAPSULE BY MOUTH DAILY., Disp: 30 capsule, Rfl: 3 .  ondansetron (ZOFRAN) 4 MG tablet, TAKE (1) TABLET BY MOUTH EVERY FOUR HOURS AS NEEDED FOR NAUSEA., Disp: 30 tablet, Rfl: 2 .  ONETOUCH DELICA LANCETS 99991111 MISC, USE AS DIRECTED 3 TIMES DAILY FOR BLOOD GLUCOSE TESTING., Disp: 300 each, Rfl: 1 .  polyethylene glycol powder (GLYCOLAX/MIRALAX) powder, Take 17 g by mouth daily., Disp: 3350 g, Rfl: 1 .  prednisoLONE acetate (PRED FORTE) 1 % ophthalmic suspension, Place 1 drop into the  left eye 4 (four) times daily., Disp: 5 mL, Rfl: 0 .  predniSONE (DELTASONE) 5 MG tablet, TAKE 1 TABLET BY MOUTH DAILY., Disp: 30 tablet, Rfl: 3 .  PROAIR HFA 108 (90 Base) MCG/ACT inhaler, INHALE 2 PUFFS BY MOUTH EVERY 6 HOURS AS NEEDED ., Disp: 8.5 g, Rfl: 2 .  promethazine (PHENERGAN) 25 MG tablet, Take 25 mg by mouth daily as needed for nausea., Disp: , Rfl:  .  pyridostigmine (MESTINON) 60 MG tablet, TAKE (1) TABLET BY MOUTH THREE TIMES A DAY., Disp: 270 tablet, Rfl: 1 .  rosuvastatin (CRESTOR) 40 MG tablet, TAKE (1) TABLET BY MOUTH AT BEDTIME., Disp: 30 tablet, Rfl: 3 .  spironolactone (ALDACTONE) 25 MG tablet, TAKE 1 TABLET BY MOUTH ONCE A DAY., Disp: 30 tablet, Rfl: 3 .  temazepam (RESTORIL) 30 MG capsule, Take 30 mg by mouth at bedtime as needed for sleep., Disp: , Rfl:  .  TRADJENTA 5 MG TABS tablet, TAKE 1 TABLET BY MOUTH ONCE A DAY., Disp: 30 tablet, Rfl: 3 .  venlafaxine XR (EFFEXOR-XR) 150 MG 24 hr capsule, TAKE 1 CAPSULE BY MOUTH DAILY., Disp: 30 capsule, Rfl: 3 .  Vitamin D, Ergocalciferol, (DRISDOL) 50000 units CAPS capsule, TAKE 1 CAPSULE BY MOUTH ONCE A WEEK., Disp: 4 capsule, Rfl: 5  BP (!) 150/61   Pulse (!) 51   Ht 5\' 6"  (1.676 m)   Wt 248 lb (112.5 kg)   BMI 40.03 kg/m   Physical Exam  Constitutional: She is oriented to person, place, and time. She appears well-developed and well-nourished. No distress.  Cardiovascular: Normal rate and intact distal pulses.   Musculoskeletal:  GAIT : SUPPORTED BY A CANE WITH NOTICEABLE LIMP   Neurological: She is alert and oriented to person, place, and time. She has normal reflexes. She exhibits normal muscle tone. Coordination normal.  Skin: Skin is warm and dry. No rash noted. She is not diaphoretic. No erythema. No pallor.  Psychiatric: She has a normal mood and affect. Her behavior is normal. Judgment and thought content normal.    Ortho Exam  Right ankle received to many toes sign medial  swelling lateral tenderness in the  subtalar joint severe pronation heel valgus which is flexible ankle stable weakness of the posterior tibial tendon skin is intact no nodularity mild peripheral edema normal sensation.  ASSESSMENT: My personal interpretation of the images:  Plain films are normal except for talonavicular joint mild arthritis and mild midfoot arthritis    PLAN Recommend AFO custom made at [facility], MD 08/31/2016 10:42 AM

## 2016-08-31 NOTE — Patient Instructions (Addendum)
CALL [employer] TO MAKE APPT FOR BRACE TO BE MADE  1 [phone]

## 2016-09-05 DIAGNOSIS — R8271 Bacteriuria: Secondary | ICD-10-CM | POA: Diagnosis not present

## 2016-09-10 ENCOUNTER — Ambulatory Visit (HOSPITAL_COMMUNITY)
Admission: RE | Admit: 2016-09-10 | Discharge: 2016-09-10 | Disposition: A | Discharge status: Home / Self Care | Payer: Medicare Other | Source: Ambulatory Visit | Attending: Family Medicine | Admitting: Family Medicine

## 2016-09-10 ENCOUNTER — Ambulatory Visit (INDEPENDENT_AMBULATORY_CARE_PROVIDER_SITE_OTHER): Payer: Medicare Other

## 2016-09-10 DIAGNOSIS — Z23 Encounter for immunization: Secondary | ICD-10-CM

## 2016-09-10 DIAGNOSIS — Z1231 Encounter for screening mammogram for malignant neoplasm of breast: Secondary | ICD-10-CM | POA: Insufficient documentation

## 2016-09-11 ENCOUNTER — Ambulatory Visit (INDEPENDENT_AMBULATORY_CARE_PROVIDER_SITE_OTHER): Payer: Medicare Other | Admitting: Gastroenterology

## 2016-09-11 ENCOUNTER — Encounter: Payer: Self-pay | Admitting: Gastroenterology

## 2016-09-11 VITALS — BP 158/83 | HR 67 | Temp 98.6°F | Ht 66.0 in | Wt 247.0 lb

## 2016-09-11 DIAGNOSIS — G8929 Other chronic pain: Secondary | ICD-10-CM | POA: Diagnosis not present

## 2016-09-11 DIAGNOSIS — R1013 Epigastric pain: Secondary | ICD-10-CM

## 2016-09-11 DIAGNOSIS — R1319 Other dysphagia: Secondary | ICD-10-CM

## 2016-09-11 DIAGNOSIS — R1310 Dysphagia, unspecified: Secondary | ICD-10-CM

## 2016-09-11 DIAGNOSIS — R1314 Dysphagia, pharyngoesophageal phase: Secondary | ICD-10-CM | POA: Diagnosis not present

## 2016-09-11 DIAGNOSIS — K219 Gastro-esophageal reflux disease without esophagitis: Secondary | ICD-10-CM

## 2016-09-11 MED ORDER — PANTOPRAZOLE SODIUM 40 MG PO TBEC: 40.0000 mg | DELAYED_RELEASE_TABLET | Freq: Every day | ORAL | 11 refills | Status: DC

## 2016-09-11 NOTE — Patient Instructions (Signed)
1. Stop omeprazole.  2. Start pantoprazole once daily before breakfast for acid reflux. 3. Barium xray of your esophagus as scheduled. We will contact you with results within 5-7 business days.

## 2016-09-11 NOTE — Progress Notes (Signed)
Primary Care Physician:  [name], MD  Primary Gastroenterologist:  Garfield Cornea, MD   Chief Complaint  Patient presents with  . Dysphagia    trouble swallowing meds/food, sometimes vomits    HPI:  [name] is a 75 y.o. female here for follow up of swallowing concerns. Patient last seen in 08/2014. She has h/o Myasthenia Gravis and esophageal rings. Last EGD 08/2014 with Schatzki's ring s/p dilation/disruption. Patient states that EGD/ED helped a lot last time. Over past 6 months she has had worsening problems swallowing, beef worse than chicken. Pills get stuck and come back up. Surviving on cooked veggies. C/o pp burning in epigastrium and chest.  Omeprazole daily before breakfast. No hematemesis or nausea. Miralax three times a week. No melena, brbpr. She has h/o chronic anemia, no evidence of IDA. Recent heme negative stool.      Current Outpatient Prescriptions  Medication Sig Dispense Refill  . amLODipine (NORVASC) 10 MG tablet TAKE 1 TABLET BY MOUTH ONCE A DAY. 30 tablet 3  . B-D ULTRAFINE III SHORT PEN 31G X 8 MM MISC USE ONCE DAILY WITH LANTUS SOLOSTAR PEN. 100 each 5  . brimonidine-timolol (COMBIGAN) 0.2-0.5 % ophthalmic solution Place 1 drop into both eyes every 12 (twelve) hours.    . cetirizine (ZYRTEC) 10 MG tablet Take 1 tablet (10 mg total) by mouth daily. (Patient taking differently: Take 10 mg by mouth as needed. ) 90 tablet 1  . citalopram (CELEXA) 40 MG tablet TAKE ONE TABLET BY MOUTH DAILY. 30 tablet 3  . clonazePAM (KLONOPIN) 0.5 MG tablet TAKE ONE TABLET BY MOUTH 3 TIMES DAILY AS NEEDED FOR ANXIETY. 90 tablet 2  . Cranberry 1000 MG CAPS Take 1,000 mg by mouth daily.     . cycloSPORINE (RESTASIS) 0.05 % ophthalmic emulsion Place 1 drop into both eyes 2 (two) times daily.     Marland Kitchen diltiazem (CARDIZEM CD) 240 MG 24 hr capsule TAKE (1) CAPSULE BY MOUTH TWICE DAILY. 60 capsule 3  . Flaxseed, Linseed, (FLAXSEED OIL) 1000 MG CAPS Take 1,000 mg by mouth daily.     . fluticasone (FLONASE) 50 MCG/ACT nasal spray Place 2 sprays into both nostrils daily. (Patient taking differently: Place 2 sprays into both nostrils as needed. ) 48 g 1  . gabapentin (NEURONTIN) 300 MG capsule TAKE 1 CAPSULE BY MOUTH THREE TIMES A DAY. 90 capsule 3  . glipiZIDE-metformin (METAGLIP) 2.5-500 MG tablet TAKE 1 TABLET BY MOUTH TWICE DAILY BEFORE MEALS. 60 tablet 3  . glucose blood (ONETOUCH VERIO) test strip USE AS DIRECTED TO TEST BLOOD GLUCOSE 3 TIMES DAILY. 300 each 1  . hydrALAZINE (APRESOLINE) 25 MG tablet TAKE 1 TABLET BY MOUTH THREE TIMES A DAY. 90 tablet 3  . hydrALAZINE (APRESOLINE) 25 MG tablet TAKE 1 TABLET BY MOUTH THREE TIMES A DAY. 90 tablet 3  . HYDROcodone-acetaminophen (NORCO) 10-325 MG tablet TAKE 1 TABLET BY MOUTH 3 TIMES DAILY. 90 tablet 0  . Insulin Glargine (LANTUS SOLOSTAR) 100 UNIT/ML Solostar Pen 15 units daily, reduced dose effective 05/04/2016 5 pen PRN  . IRON PO Take 1 tablet by mouth 2 (two) times daily.    Marland Kitchen losartan (COZAAR) 100 MG tablet TAKE (1) TABLET BY MOUTH DAILY. 30 tablet 3  . Multiple Vitamin (MULTIVITAMIN WITH MINERALS) TABS tablet Take 1 tablet by mouth at bedtime.    Marland Kitchen omeprazole (PRILOSEC) 20 MG capsule TAKE ONE CAPSULE BY MOUTH DAILY. 30 capsule 3  . ondansetron (ZOFRAN) 4 MG tablet TAKE (1) TABLET  BY MOUTH EVERY FOUR HOURS AS NEEDED FOR NAUSEA. 30 tablet 2  . ONETOUCH DELICA LANCETS 99991111 MISC USE AS DIRECTED 3 TIMES DAILY FOR BLOOD GLUCOSE TESTING. 300 each 1  . polyethylene glycol powder (GLYCOLAX/MIRALAX) powder Take 17 g by mouth daily. (Patient taking differently: Take 17 g by mouth every other day. ) 3350 g 1  . prednisoLONE acetate (PRED FORTE) 1 % ophthalmic suspension Place 1 drop into the left eye 4 (four) times daily. 5 mL 0  . predniSONE (DELTASONE) 5 MG tablet TAKE 1 TABLET BY MOUTH DAILY. 30 tablet 3  . PROAIR HFA 108 (90 Base) MCG/ACT inhaler INHALE 2 PUFFS BY MOUTH EVERY 6 HOURS AS NEEDED . 8.5 g 2  . promethazine  (PHENERGAN) 25 MG tablet Take 25 mg by mouth daily as needed for nausea.    Marland Kitchen pyridostigmine (MESTINON) 60 MG tablet TAKE (1) TABLET BY MOUTH THREE TIMES A DAY. 270 tablet 1  . rosuvastatin (CRESTOR) 40 MG tablet TAKE (1) TABLET BY MOUTH AT BEDTIME. 30 tablet 3  . spironolactone (ALDACTONE) 25 MG tablet TAKE 1 TABLET BY MOUTH ONCE A DAY. (Patient taking differently: TAKE 1 TABLET BY MOUTH ONCE A DAY. Takes 3 times per week) 30 tablet 3  . temazepam (RESTORIL) 30 MG capsule Take 30 mg by mouth at bedtime as needed for sleep.    . TRADJENTA 5 MG TABS tablet TAKE 1 TABLET BY MOUTH ONCE A DAY. 30 tablet 3  . venlafaxine XR (EFFEXOR-XR) 150 MG 24 hr capsule TAKE 1 CAPSULE BY MOUTH DAILY. 30 capsule 3  . Vitamin D, Ergocalciferol, (DRISDOL) 50000 units CAPS capsule TAKE 1 CAPSULE BY MOUTH ONCE A WEEK. (Patient not taking: Reported on 09/11/2016) 4 capsule 5   No current facility-administered medications for this visit.     Allergies as of 09/11/2016  . (No Known Allergies)    Past Medical History:  Diagnosis Date  . Anemia in chronic renal disease 12/30/2015  . Anxiety   . Arthritis    "all over" (01/19/2014)  . Carpal tunnel syndrome   . Chronic bronchitis ([place])    "got it q yr for awhile; hasn't had it in awhile" (01/19/2014)  . Chronic kidney disease (CKD) stage G3b/A1, moderately decreased glomerular filtration rate (GFR) between 30-44 mL/min/1.73 square meter and albuminuria creatinine ratio less than 30 mg/g 09/14/2009   Qualifier: Diagnosis of  By: Moshe Cipro MD, Joycelyn Schmid    . Chronic renal disease, stage 3, moderately decreased glomerular filtration rate (GFR) between 30-59 mL/min/1.73 square meter 09/14/2009   Qualifier: Diagnosis of  By: Moshe Cipro MD, Joycelyn Schmid    . Depression   . Diverticulosis 07/2004   Colonscopy Dr Gala Romney  . Esophagitis, erosive 2009  . Exertional shortness of breath   . Gastroesophageal reflux   . [id number]    "usually a couple times/wk" (01/19/2014)  . Heart  murmur    saw cardiology In [place], he told her she did not need to come back.  . Hyperlipidemia   . Hypertension   . Impingement syndrome, shoulder   . Iron deficiency anemia   . Mitral regurgitation   . Myasthenia gravis   . Myasthenia gravis in remission ([place]) 11/24/2014  . Obesity   . OSA on CPAP   . Pneumonia 01/2012  . Pulmonary HTN ([place])   . Schatzki's ring    Last EGD w/ dilation 02/08/11, 2009 & 2007  . Seasonal allergies   . Shoulder pain   . Thyroid disease    "used  to take RX; they took me off it" (01/19/2014)  . Tobacco abuse   . Type II diabetes mellitus ([place])     Past Surgical History:  Procedure Laterality Date  . [place] VITRECTOMY WITH 20 GAUGE MVR PORT Left 01/19/2014   Procedure: 25 GAUGE PARS PLANA VITRECTOMY WITH 20 GAUGE MVR PORT; MEMBRAME PEEL; SERUM PATCH; LASER TREATMENT; C3F8;  Surgeon: Hayden Pedro, MD;  Location: [facility];  Service: Ophthalmology;  Laterality: Left;  . ABDOMINAL HYSTERECTOMY    . APPENDECTOMY    . CARPAL TUNNEL RELEASE Bilateral   . CATARACT EXTRACTION W/PHACO Left 10/21/2013   Procedure: LEFT CATARACT EXTRACTION PHACO AND INTRAOCULAR LENS PLACEMENT (IOC);  Surgeon: Marylynn Pearson, MD;  Location: [facility];  Service: Ophthalmology;  Laterality: Left;  . CHOLECYSTECTOMY    . COLONOSCOPY  05/08/2012   [id number] and external hemorrhoids; colonic diverticulosis  . ESOPHAGEAL DILATION     "more than 3 times" (01/19/2014)  . ESOPHAGOGASTRODUODENOSCOPY  02/08/11   Rourk-Distal esophageal erosion consistent with mild erosive reflux   esophagitis/ Noncritical Schatzki ring, small hiatal hernia otherwise upper/ gastrointestinal tract appeared unremarkable, status post passage  of a Maloney dilation to biopsy disruption of the ring described  . ESOPHAGOGASTRODUODENOSCOPY  04/2012   2 tandem incomplete distal esophagea rings s/p dilation.   . ESOPHAGOGASTRODUODENOSCOPY N/A 09/16/2014   Procedure: ESOPHAGOGASTRODUODENOSCOPY (EGD);  Surgeon:  Daneil Dolin, MD;  [facility];  Service: Endoscopy;  Laterality: N/A;  1200  . EYE SURGERY    . INCONTINENCE SURGERY  08/26/09   Tananbaum  . JOINT REPLACEMENT     right total knee  . MALONEY DILATION N/A 09/16/2014   Procedure: Venia Minks DILATION;  Surgeon: Daneil Dolin, MD;  [facility];  Service: Endoscopy;  Laterality: N/A;  . PARS PLANA VITRECTOMY W/ REPAIR OF MACULAR HOLE Left 01/19/2014  . SAVORY DILATION N/A 09/16/2014   Procedure: SAVORY DILATION;  Surgeon: Daneil Dolin, MD;  [facility];  Service: Endoscopy;  Laterality: N/A;  . TONSILLECTOMY    . TOTAL KNEE ARTHROPLASTY Right 05/13/07   Dr. Aline Brochure    Family History  Problem Relation Age of Onset  . Diabetes Son   . Hypertension Son   . Hypertension Daughter   . Cancer Mother   . Heart disease Mother   . Cancer Father   . Heart disease Father   . Colon cancer Neg Hx   . Diabetes Sister   . Cancer Sister   . Kidney failure Sister     Social History   Social History  . Marital status: Single    Spouse name: N/A  . Number of children: 2  . Years of education: N/A   Occupational History  . disabled Retired   Social History Main Topics  . Smoking status: Former Smoker    Packs/day: 0.50    Years: 25.00    Types: Cigarettes    Quit date: 01/01/2004  . Smokeless tobacco: Never Used  . Alcohol use No  . Drug use: No  . Sexual activity: No   Other Topics Concern  . Not on file   Social History Narrative   Patient lives at home by herself   Patient drinks one cup of coffee a day   Patient is right handed.      ROS:  General: Negative for anorexia, weight loss, fever, chills,+ fatigue, weakness. Eyes: Negative for vision changes.  ENT: Negative for hoarseness, difficulty swallowing ,  nasal congestion. CV: Negative for chest pain, angina, palpitations, dyspnea on exertion, peripheral edema.  Respiratory: Negative for dyspnea at rest, dyspnea on exertion, cough,  sputum, wheezing.  GI: See history of present illness. GU:  Negative for dysuria, hematuria, urinary incontinence, urinary frequency, nocturnal urination.  MS: Negative for joint pain, low back pain.  Derm: Negative for rash or itching.  Neuro: Negative for weakness, abnormal sensation, seizure, frequent headaches, memory loss, confusion.  Psych: Negative for anxiety, depression, suicidal ideation, hallucinations.  Endo: Negative for unusual weight change.  Heme: Negative for bruising or bleeding. Allergy: Negative for rash or hives.    Physical Examination:  BP (!) 158/83   Pulse 67   Temp 98.6 F (37 C) (Oral)   Ht 5\' 6"  (1.676 m)   Wt 247 lb (112 kg)   BMI 39.87 kg/m    General: Well-nourished, well-developed in no acute distress.  Head: Normocephalic, atraumatic.   Eyes: Conjunctiva pink, no icterus. Mouth: Oropharyngeal mucosa moist and pink , no lesions erythema or exudate. Neck: Supple without thyromegaly, masses, or lymphadenopathy.  Lungs: Clear to auscultation bilaterally.  Heart: Regular rate and rhythm, no murmurs rubs or gallops.  Abdomen: Bowel sounds are normal, nontender, nondistended, no hepatosplenomegaly or masses, no abdominal bruits or    hernia , no rebound or guarding.   Rectal: not performed Extremities: No lower extremity edema. No clubbing or deformities.  Neuro: Alert and oriented x 4 , grossly normal neurologically.  Skin: Warm and dry, no rash or jaundice.   Psych: Alert and cooperative, normal mood and affect.  Labs: Lab Results  Component Value Date   HGBA1C 6.7 (H) 08/15/2016   Lab Results  Component Value Date   CREATININE 0.93 08/15/2016   BUN 16 08/15/2016   NA 141 08/15/2016   K 4.4 08/15/2016   CL 108 08/15/2016   CO2 28 08/15/2016   Lab Results  Component Value Date   ALT 13 08/15/2016   AST 15 08/15/2016   ALKPHOS 138 (H) 08/15/2016   BILITOT 0.5 08/15/2016   Lab Results  Component Value Date   WBC 7.7 03/13/2016    HGB 9.9 (L) 03/13/2016   HCT 30.7 (L) 03/13/2016   MCV 89.8 03/13/2016   PLT 278 03/13/2016   Lab Results  Component Value Date   IRON 72 03/13/2016   TIBC 270 03/13/2016   FERRITIN 128 03/13/2016     Imaging Studies: Dg Ankle Complete Right  Result Date: 08/31/2016 3 views right ankle for right ankle pain ankle mortise looks intact bone looks good talus looks normal Lateral x-ray shows calcaneal heel spur on the plantar surface mild arthritis in the mid foot and talonavicular joint or may be an os trigonum as well Soft tissue swelling is noted on the medial and lateral sides without evidence of fracture or dislocation Impression normal ankle with some mild midfoot arthritis

## 2016-09-14 ENCOUNTER — Other Ambulatory Visit: Payer: Self-pay | Admitting: Family Medicine

## 2016-09-16 NOTE — Assessment & Plan Note (Signed)
Recurrent dysphagia in setting of Myasthenia Gravis and known esophageal rings. Patient unsure about proceeding with EGD. Would consider barium pill esophagram for further evaluation, evaluate for possible stricture. Cannot exclude possibility of Myasthenia Gravis contributing to symptoms.  Switch PPI to pantoprazole once daily for some breakthrough heartburn.

## 2016-09-17 NOTE — Progress Notes (Signed)
CC'ED TO PCP 

## 2016-09-18 ENCOUNTER — Telehealth: Payer: Self-pay | Admitting: Internal Medicine

## 2016-09-18 NOTE — Telephone Encounter (Signed)
Pt called to say that the medication is helping her and that she cancelled her swallowing test because she felt so much better.

## 2016-09-19 ENCOUNTER — Other Ambulatory Visit: Payer: Self-pay

## 2016-09-19 MED ORDER — HYDROCODONE-ACETAMINOPHEN 10-325 MG PO TABS: ORAL_TABLET | ORAL | 0 refills | Status: DC

## 2016-09-19 NOTE — Telephone Encounter (Signed)
Routing to [facility] 

## 2016-09-20 NOTE — Telephone Encounter (Signed)
Noted. Thanks.

## 2016-09-21 ENCOUNTER — Inpatient Hospital Stay (HOSPITAL_COMMUNITY): Admission: RE | Admit: 2016-09-21 | Payer: Medicare Other | Source: Ambulatory Visit

## 2016-09-21 DIAGNOSIS — R8271 Bacteriuria: Secondary | ICD-10-CM | POA: Diagnosis not present

## 2016-09-28 DIAGNOSIS — M76821 Posterior tibial tendinitis, right leg: Secondary | ICD-10-CM | POA: Diagnosis not present

## 2016-10-01 ENCOUNTER — Ambulatory Visit: Payer: Medicare Other | Admitting: Orthopedic Surgery

## 2016-10-02 ENCOUNTER — Other Ambulatory Visit: Payer: Self-pay | Admitting: Family Medicine

## 2016-10-03 ENCOUNTER — Other Ambulatory Visit: Payer: Self-pay

## 2016-10-03 MED ORDER — ONDANSETRON HCL 4 MG PO TABS: ORAL_TABLET | ORAL | 2 refills | Status: DC

## 2016-10-03 MED ORDER — POLYETHYLENE GLYCOL 3350 17 GM/SCOOP PO POWD: 17.0000 g | Freq: Every day | ORAL | 1 refills | Status: DC

## 2016-10-05 ENCOUNTER — Ambulatory Visit (INDEPENDENT_AMBULATORY_CARE_PROVIDER_SITE_OTHER): Payer: Medicare Other | Admitting: Orthopedic Surgery

## 2016-10-05 ENCOUNTER — Encounter: Payer: Self-pay | Admitting: Orthopedic Surgery

## 2016-10-05 VITALS — BP 107/56 | HR 64 | Wt 250.0 lb

## 2016-10-05 DIAGNOSIS — M25562 Pain in left knee: Secondary | ICD-10-CM | POA: Diagnosis not present

## 2016-10-05 DIAGNOSIS — G8929 Other chronic pain: Secondary | ICD-10-CM | POA: Diagnosis not present

## 2016-10-05 DIAGNOSIS — M1712 Unilateral primary osteoarthritis, left knee: Secondary | ICD-10-CM | POA: Diagnosis not present

## 2016-10-05 DIAGNOSIS — M76829 Posterior tibial tendinitis, unspecified leg: Secondary | ICD-10-CM

## 2016-10-05 DIAGNOSIS — M2140 Flat foot [pes planus] (acquired), unspecified foot: Secondary | ICD-10-CM | POA: Diagnosis not present

## 2016-10-05 MED ORDER — HYDROCODONE-ACETAMINOPHEN 10-325 MG PO TABS: ORAL_TABLET | ORAL | 0 refills | Status: DC

## 2016-10-05 NOTE — Progress Notes (Signed)
Patient ID: [name], female   DOB: September 20, 1941, 75 y.o.   MRN: [id number]  Chief Complaint  Patient presents with  . Follow-up    right ankle    HPI [name] is a 75 y.o. female.  [place] received the ankle gauntlet which she loves. This is improved her ankle pain  However she complains of left knee pain: Medial joint line, dull, moderate to severe, chronic, constant, worse with walking.  Review of Systems Review of Systems 1. Gross generalized pain shoulder knee 2. Denies chest pain or shortness of breath   Diagnosis Date  . Anemia in chronic renal disease 12/30/2015  . Anxiety   . Arthritis    "all over" (01/19/2014)  . Carpal tunnel syndrome   . Chronic bronchitis ([place])    "got it q yr for awhile; hasn't had it in awhile" (01/19/2014)  . Chronic kidney disease (CKD) stage G3b/A1, moderately decreased glomerular filtration rate (GFR) between 30-44 mL/min/1.73 square meter and albuminuria creatinine ratio less than 30 mg/g 09/14/2009   Qualifier: Diagnosis of  By: Moshe Cipro MD, Joycelyn Schmid    . Chronic renal disease, stage 3, moderately decreased glomerular filtration rate (GFR) between 30-59 mL/min/1.73 square meter 09/14/2009   Qualifier: Diagnosis of  By: Moshe Cipro MD, Joycelyn Schmid    . Depression   . Diverticulosis 07/2004   Colonscopy Dr Gala Romney  . Esophagitis, erosive 2009  . Exertional shortness of breath   . Gastroesophageal reflux   . [id number]    "usually a couple times/wk" (01/19/2014)  . Heart murmur    saw cardiology In [place], he told her she did not need to come back.  . Hyperlipidemia   . Hypertension   . Impingement syndrome, shoulder   . Iron deficiency anemia   . Mitral regurgitation   . Myasthenia gravis   . Myasthenia gravis in remission ([place]) 11/24/2014  . Obesity   . OSA on CPAP   . Pneumonia 01/2012  . Pulmonary HTN   . Schatzki's ring    Last EGD w/ dilation 02/08/11, 2009 & 2007  . Seasonal allergies   . Shoulder pain   . Thyroid  disease    "used to take RX; they took me off it" (01/19/2014)  . Tobacco abuse   . Type II diabetes mellitus ([place])     Past Surgical History:  Procedure Laterality Date  . [place] VITRECTOMY WITH 20 GAUGE MVR PORT Left 01/19/2014   Procedure: 25 GAUGE PARS PLANA VITRECTOMY WITH 20 GAUGE MVR PORT; MEMBRAME PEEL; SERUM PATCH; LASER TREATMENT; C3F8;  Surgeon: Hayden Pedro, MD;  Location: [facility];  Service: Ophthalmology;  Laterality: Left;  . ABDOMINAL HYSTERECTOMY    . APPENDECTOMY    . CARPAL TUNNEL RELEASE Bilateral   . CATARACT EXTRACTION W/PHACO Left 10/21/2013   Procedure: LEFT CATARACT EXTRACTION PHACO AND INTRAOCULAR LENS PLACEMENT (IOC);  Surgeon: Marylynn Pearson, MD;  Location: [facility];  Service: Ophthalmology;  Laterality: Left;  . CHOLECYSTECTOMY    . COLONOSCOPY  05/08/2012   [id number] and external hemorrhoids; colonic diverticulosis  . ESOPHAGEAL DILATION     "more than 3 times" (01/19/2014)  . ESOPHAGOGASTRODUODENOSCOPY  02/08/11   [place]-Distal esophageal erosion consistent with mild erosive reflux   esophagitis/ Noncritical Schatzki ring, small hiatal hernia otherwise upper/ gastrointestinal tract appeared unremarkable, status post passage  of a Maloney dilation to biopsy disruption of the ring described  . ESOPHAGOGASTRODUODENOSCOPY  04/2012   2 tandem incomplete distal esophagea rings s/p dilation.   Marland Kitchen  ESOPHAGOGASTRODUODENOSCOPY N/A 09/16/2014   Procedure: ESOPHAGOGASTRODUODENOSCOPY (EGD);  Surgeon: Daneil Dolin, MD;  [facility];  Service: Endoscopy;  Laterality: N/A;  1200  . EYE SURGERY    . INCONTINENCE SURGERY  08/26/09   [place]  . JOINT REPLACEMENT     right total knee  . MALONEY DILATION N/A 09/16/2014   Procedure: Venia Minks DILATION;  Surgeon: Daneil Dolin, MD;  [facility];  Service: Endoscopy;  Laterality: N/A;  . PARS PLANA VITRECTOMY W/ REPAIR OF MACULAR HOLE Left 01/19/2014  . SAVORY DILATION N/A 09/16/2014   Procedure:  SAVORY DILATION;  Surgeon: Daneil Dolin, MD;  [facility];  Service: Endoscopy;  Laterality: N/A;  . TONSILLECTOMY    . TOTAL KNEE ARTHROPLASTY Right 05/13/07   Dr. Aline Brochure    Social History Social History  Substance Use Topics  . Smoking status: Former Smoker    Packs/day: 0.50    Years: 25.00    Types: Cigarettes    Quit date: 01/01/2004  . Smokeless tobacco: Never Used  . Alcohol use No    No Known Allergies  Current Meds  Medication Sig  . amLODipine (NORVASC) 10 MG tablet TAKE 1 TABLET BY MOUTH ONCE A DAY.  Marland Kitchen B-D ULTRAFINE III SHORT PEN 31G X 8 MM MISC USE ONCE DAILY WITH LANTUS SOLOSTAR PEN.  . brimonidine-timolol (COMBIGAN) 0.2-0.5 % ophthalmic solution Place 1 drop into both eyes every 12 (twelve) hours.  . cetirizine (ZYRTEC) 10 MG tablet Take 1 tablet (10 mg total) by mouth daily. (Patient taking differently: Take 10 mg by mouth as needed. )  . citalopram (CELEXA) 40 MG tablet TAKE ONE TABLET BY MOUTH DAILY.  . clonazePAM (KLONOPIN) 0.5 MG tablet TAKE ONE TABLET BY MOUTH 3 TIMES DAILY AS NEEDED FOR ANXIETY.  . Cranberry 1000 MG CAPS Take 1,000 mg by mouth daily.   . cycloSPORINE (RESTASIS) 0.05 % ophthalmic emulsion Place 1 drop into both eyes 2 (two) times daily.   Marland Kitchen diltiazem (CARDIZEM CD) 240 MG 24 hr capsule TAKE (1) CAPSULE BY MOUTH TWICE DAILY.  Marland Kitchen Flaxseed, Linseed, (FLAXSEED OIL) 1000 MG CAPS Take 1,000 mg by mouth daily.  . fluticasone (FLONASE) 50 MCG/ACT nasal spray Place 2 sprays into both nostrils daily. (Patient taking differently: Place 2 sprays into both nostrils as needed. )  . gabapentin (NEURONTIN) 300 MG capsule TAKE 1 CAPSULE BY MOUTH THREE TIMES A DAY.  Marland Kitchen glipiZIDE-metformin (METAGLIP) 2.5-500 MG tablet TAKE 1 TABLET BY MOUTH TWICE DAILY BEFORE MEALS.  . glucose blood (ONETOUCH VERIO) test strip USE AS DIRECTED TO TEST BLOOD GLUCOSE 3 TIMES DAILY.  . hydrALAZINE (APRESOLINE) 25 MG tablet TAKE 1 TABLET BY MOUTH THREE TIMES A DAY.  .  hydrALAZINE (APRESOLINE) 25 MG tablet TAKE 1 TABLET BY MOUTH THREE TIMES A DAY.  Marland Kitchen HYDROcodone-acetaminophen (NORCO) 10-325 MG tablet TAKE 1 TABLET BY MOUTH 3 TIMES DAILY.  Marland Kitchen Insulin Glargine (LANTUS SOLOSTAR) 100 UNIT/ML Solostar Pen 15 units daily, reduced dose effective 05/04/2016  . IRON PO Take 1 tablet by mouth 2 (two) times daily.  Marland Kitchen losartan (COZAAR) 100 MG tablet TAKE (1) TABLET BY MOUTH DAILY.  . Multiple Vitamin (MULTIVITAMIN WITH MINERALS) TABS tablet Take 1 tablet by mouth at bedtime.  . ondansetron (ZOFRAN) 4 MG tablet TAKE (1) TABLET BY MOUTH EVERY FOUR HOURS AS NEEDED FOR NAUSEA.  Glory Rosebush DELICA LANCETS 99991111 MISC USE AS DIRECTED 3 TIMES DAILY FOR BLOOD GLUCOSE TESTING.  . pantoprazole (PROTONIX) 40 MG  tablet Take 1 tablet (40 mg total) by mouth daily before breakfast. Stop omeprazole.  . polyethylene glycol powder (GLYCOLAX/MIRALAX) powder Take 17 g by mouth daily.  . prednisoLONE acetate (PRED FORTE) 1 % ophthalmic suspension Place 1 drop into the left eye 4 (four) times daily.  . predniSONE (DELTASONE) 5 MG tablet TAKE 1 TABLET BY MOUTH DAILY.  Marland Kitchen PROAIR HFA 108 (90 Base) MCG/ACT inhaler INHALE 2 PUFFS BY MOUTH EVERY 6 HOURS AS NEEDED .  Marland Kitchen promethazine (PHENERGAN) 25 MG tablet Take 25 mg by mouth daily as needed for nausea.  Marland Kitchen pyridostigmine (MESTINON) 60 MG tablet TAKE (1) TABLET BY MOUTH THREE TIMES A DAY.  . rosuvastatin (CRESTOR) 40 MG tablet TAKE (1) TABLET BY MOUTH AT BEDTIME.  Marland Kitchen spironolactone (ALDACTONE) 25 MG tablet TAKE 1 TABLET BY MOUTH ONCE A DAY. (Patient taking differently: TAKE 1 TABLET BY MOUTH ONCE A DAY. Takes 3 times per week)  . temazepam (RESTORIL) 30 MG capsule Take 30 mg by mouth at bedtime as needed for sleep.  . TRADJENTA 5 MG TABS tablet TAKE 1 TABLET BY MOUTH ONCE A DAY.  Marland Kitchen venlafaxine XR (EFFEXOR-XR) 150 MG 24 hr capsule TAKE 1 CAPSULE BY MOUTH DAILY.  Marland Kitchen Vitamin D, Ergocalciferol, (DRISDOL) 50000 units CAPS capsule TAKE 1 CAPSULE BY MOUTH ONCE A  WEEK.  . [DISCONTINUED] HYDROcodone-acetaminophen (NORCO) 10-325 MG tablet TAKE 1 TABLET BY MOUTH 3 TIMES DAILY.      Physical Exam Physical Exam BP (!) 107/56   Pulse 64   Wt 250 lb (113.4 kg)   BMI 40.35 kg/m   Gen. appearance. The patient is well-developed and well-nourished, grooming and hygiene are normal. There are no gross congenital abnormalities  The patient is alert and oriented to person place and time  Mood and affect are normal  AmbulatiSlight abnormality with turning out of the right foot slight limp right leg  Examination reveals the following: On inspection we findtenderness over the medial joint line of the left knee   With the range of motion o110  Stability tests were normal   flexion and extension medial and lateral  Strength tests revealed grade 5 motor strength  Skin we find no rash ulceration or erythema  Sensation remains intact  Impression vascular system shows no peripheral edema  Data Reviewed   Assessment      left knee arthritis  Left knee pain  Right ankle pain with posterior tibial tendon dysfunction  Chronic pain  Plan  inject left knee, refill pain medicine increased dose to every 6 hours secondary to inadequate pain relief. Continue ankle brace, urine drug screen.        Arther Abbott 10/05/2016, 12:53 PM

## 2016-10-06 LAB — PAIN MGMT, PROFILE 1 W/O CONF, U
Amphetamines: NEGATIVE ng/mL (ref <500)
BARBITURATES: NEGATIVE ng/mL (ref <300)
BENZODIAZEPINES: NEGATIVE ng/mL (ref <100)
Cocaine Metabolite: NEGATIVE ng/mL (ref <150)
Creatinine: 141.2 mg/dL (ref >=20.0)
METHADONE METABOLITE: NEGATIVE ng/mL (ref <100)
Marijuana Metabolite: NEGATIVE ng/mL (ref <20)
OPIATES: NEGATIVE ng/mL (ref <100)
OXIDANT: NEGATIVE ug/mL (ref <200)
Oxycodone: NEGATIVE ng/mL (ref <100)
PH: 5.77 (ref 4.5–9.0)
Phencyclidine: NEGATIVE ng/mL (ref <25)

## 2016-10-22 DIAGNOSIS — N3000 Acute cystitis without hematuria: Secondary | ICD-10-CM | POA: Diagnosis not present

## 2016-10-22 DIAGNOSIS — R350 Frequency of micturition: Secondary | ICD-10-CM | POA: Diagnosis not present

## 2016-10-29 ENCOUNTER — Other Ambulatory Visit: Payer: Self-pay

## 2016-10-29 MED ORDER — HYDROCODONE-ACETAMINOPHEN 10-325 MG PO TABS: ORAL_TABLET | ORAL | 0 refills | Status: DC

## 2016-11-07 DIAGNOSIS — H35371 Puckering of macula, right eye: Secondary | ICD-10-CM | POA: Diagnosis not present

## 2016-11-07 DIAGNOSIS — H401132 Primary open-angle glaucoma, bilateral, moderate stage: Secondary | ICD-10-CM | POA: Diagnosis not present

## 2016-11-15 ENCOUNTER — Other Ambulatory Visit: Payer: Self-pay | Admitting: Family Medicine

## 2016-11-29 ENCOUNTER — Ambulatory Visit (INDEPENDENT_AMBULATORY_CARE_PROVIDER_SITE_OTHER): Payer: Medicare Other | Admitting: Family Medicine

## 2016-11-29 ENCOUNTER — Encounter: Payer: Self-pay | Admitting: Family Medicine

## 2016-11-29 VITALS — BP 140/64 | HR 90 | Resp 16 | Ht 66.0 in | Wt 274.0 lb

## 2016-11-29 DIAGNOSIS — E032 Hypothyroidism due to medicaments and other exogenous substances: Secondary | ICD-10-CM | POA: Diagnosis not present

## 2016-11-29 DIAGNOSIS — R069 Unspecified abnormalities of breathing: Secondary | ICD-10-CM | POA: Diagnosis not present

## 2016-11-29 DIAGNOSIS — E1165 Type 2 diabetes mellitus with hyperglycemia: Secondary | ICD-10-CM

## 2016-11-29 DIAGNOSIS — E785 Hyperlipidemia, unspecified: Secondary | ICD-10-CM

## 2016-11-29 DIAGNOSIS — E119 Type 2 diabetes mellitus without complications: Secondary | ICD-10-CM | POA: Diagnosis not present

## 2016-11-29 DIAGNOSIS — E1121 Type 2 diabetes mellitus with diabetic nephropathy: Secondary | ICD-10-CM | POA: Diagnosis not present

## 2016-11-29 DIAGNOSIS — N183 Chronic kidney disease, stage 3 unspecified: Secondary | ICD-10-CM

## 2016-11-29 DIAGNOSIS — IMO0002 Reserved for concepts with insufficient information to code with codable children: Secondary | ICD-10-CM

## 2016-11-29 DIAGNOSIS — R635 Abnormal weight gain: Secondary | ICD-10-CM

## 2016-11-29 DIAGNOSIS — F418 Other specified anxiety disorders: Secondary | ICD-10-CM

## 2016-11-29 DIAGNOSIS — I10 Essential (primary) hypertension: Secondary | ICD-10-CM

## 2016-11-29 DIAGNOSIS — IMO0001 Reserved for inherently not codable concepts without codable children: Secondary | ICD-10-CM

## 2016-11-29 DIAGNOSIS — Z794 Long term (current) use of insulin: Secondary | ICD-10-CM

## 2016-11-29 MED ORDER — METOLAZONE 2.5 MG PO TABS
2.5000 mg | ORAL_TABLET | Freq: Every day | ORAL | 0 refills | Status: DC
Start: 2016-11-30 — End: 2016-12-11

## 2016-11-29 MED ORDER — FUROSEMIDE 40 MG PO TABS: 40.0000 mg | ORAL_TABLET | Freq: Two times a day (BID) | ORAL | 0 refills | Status: DC

## 2016-11-29 MED ORDER — METOLAZONE 2.5 MG PO TABS: 2.5000 mg | ORAL_TABLET | Freq: Every day | ORAL | 0 refills | Status: DC

## 2016-11-29 NOTE — Patient Instructions (Signed)
F/u as before  Lasix and zaroxolyn sent today for swelling start lasix today and zaroxolyn tomorrow morning  We will call re lab results tomorrow  Limit sodium  Reduce lantus to 15 units since you are having sugar lows

## 2016-11-30 LAB — COMPLETE METABOLIC PANEL WITH GFR
ALT: 13 U/L (ref 6–29)
AST: 15 U/L (ref 10–35)
Albumin: 3.4 g/dL — ABNORMAL LOW (ref 3.6–5.1)
Alkaline Phosphatase: 115 U/L (ref 33–130)
BUN: 20 mg/dL (ref 7–25)
CALCIUM: 8.9 mg/dL (ref 8.6–10.4)
CHLORIDE: 112 mmol/L — AB (ref 98–110)
CO2: 27 mmol/L (ref 20–31)
CREATININE: 1.29 mg/dL — AB (ref 0.60–0.93)
GFR, Est African American: 47 mL/min — ABNORMAL LOW (ref >=60)
GFR, Est Non African American: 41 mL/min — ABNORMAL LOW (ref >=60)
Glucose, Bld: 138 mg/dL — ABNORMAL HIGH (ref 65–99)
POTASSIUM: 4.6 mmol/L (ref 3.5–5.3)
SODIUM: 144 mmol/L (ref 135–146)
Total Bilirubin: 0.4 mg/dL (ref 0.2–1.2)
Total Protein: 6 g/dL — ABNORMAL LOW (ref 6.1–8.1)

## 2016-11-30 LAB — POCT URINALYSIS DIPSTICK
GLUCOSE UA: NEGATIVE
Leukocytes, UA: NEGATIVE
NITRITE UA: NEGATIVE
PH UA: 5.5
Protein, UA: >300
RBC UA: NEGATIVE
UROBILINOGEN UA: 1

## 2016-11-30 LAB — HEMOGLOBIN A1C
Hgb A1c MFr Bld: 6.4 % — ABNORMAL HIGH (ref <5.7)
Mean Plasma Glucose: 137 mg/dL

## 2016-11-30 LAB — TSH: TSH: 1.36 m[IU]/L

## 2016-11-30 LAB — BRAIN NATRIURETIC PEPTIDE: BRAIN NATRIURETIC PEPTIDE: 177.8 pg/mL — AB (ref <100)

## 2016-12-02 ENCOUNTER — Encounter: Payer: Self-pay | Admitting: Family Medicine

## 2016-12-02 NOTE — Assessment & Plan Note (Signed)
Overcorrecte, stop metaglip [name] is reminded of the importance of commitment to daily physical activity for 30 minutes or more, as able and the need to limit carbohydrate intake to 30 to 60 grams per meal to help with blood sugar control.   The need to take medication as prescribed, test blood sugar as directed, and to call between visits if there is a concern that blood sugar is uncontrolled is also discussed.   [name] is reminded of the importance of daily foot exam, annual eye examination, and good blood sugar, blood pressure and cholesterol control.  Diabetic Labs Latest Ref Rng & Units 11/29/2016 08/15/2016 05/03/2016 03/16/2016 11/30/2015  HbA1c <5.7 % 6.4(H) 6.7(H) 6.6(H) - -  Microalbumin Not estab mg/dL - - - - -  Micro/Creat Ratio <30 mcg/mg creat - - - - -  Chol 125 - 200 mg/dL - - 181 - -  HDL >=46 mg/dL - - 59 - -  Calc LDL <130 mg/dL - - 108 - -  Triglycerides <150 mg/dL - - 68 - -  Creatinine 0.60 - 0.93 mg/dL 1.29(H) 0.93 - 0.86 1.41(H)   BP/Weight 11/29/2016 10/05/2016 09/11/2016 08/31/2016 08/15/2016 04/11/2016 [id number]  Systolic BP XX123456 XX123456 0000000 Q000111Q 123456 0000000 0000000  Diastolic BP 64 56 83 61 70 70 65  Wt. (Lbs) 274 250 247 248 253 247 244  BMI 44.22 40.35 39.87 40.03 40.84 39.89 39.4   Foot/eye exam completion dates Latest Ref Rng & Units 08/15/2016 04/27/2016  Eye Exam No Retinopathy - Retinopathy(A)  Foot Form Completion - Done -

## 2016-12-02 NOTE — Assessment & Plan Note (Signed)
Hyperlipidemia:Low fat diet discussed and encouraged. Updated lab needed at/ before next visit.    Lipid Panel  Lab Results  Component Value Date   CHOL 181 05/03/2016   HDL 59 05/03/2016   LDLCALC 108 05/03/2016   TRIG 68 05/03/2016   CHOLHDL 3.1 05/03/2016

## 2016-12-02 NOTE — Assessment & Plan Note (Signed)
Updated lab needed at/ before next visit.   

## 2016-12-02 NOTE — Assessment & Plan Note (Signed)
Deteriorated. Patient re-educated about  the importance of commitment to a  minimum of 150 minutes of exercise per week.  The importance of healthy food choices with portion control discussed. Encouraged to start a food diary, count calories and to consider  joining a support group. Sample diet sheets offered. Goals set by the patient for the next several months.   Weight /BMI 11/29/2016 10/05/2016 09/11/2016  WEIGHT 274 lb 250 lb 247 lb  HEIGHT 5\' 6"  - 5\' 6"   BMI 44.22 kg/m2 40.35 kg/m2 39.87 kg/m2  excessive weight gain attributed in great part to fluid retention, wll re assess in next 2 weeks

## 2016-12-02 NOTE — Assessment & Plan Note (Signed)
Deterioration in renal function causing leg swelling, will r/o CHF with BNP test. Start diuretics and re eval in next 10 to 14 days Stop metformin, has hypoglycemia and impaired renal function

## 2016-12-02 NOTE — Assessment & Plan Note (Signed)
Elevated systolic likely due to fluid retention, no med change DASH diet and commitment to daily physical activity for a minimum of 30 minutes discussed and encouraged, as a part of hypertension management. The importance of attaining a healthy weight is also discussed.  BP/Weight 11/29/2016 10/05/2016 09/11/2016 08/31/2016 08/15/2016 04/11/2016 [id number]  Systolic BP XX123456 XX123456 0000000 Q000111Q 123456 0000000 0000000  Diastolic BP 64 56 83 61 70 70 65  Wt. (Lbs) 274 250 247 248 253 247 244  BMI 44.22 40.35 39.87 40.03 40.84 39.89 39.4

## 2016-12-02 NOTE — Progress Notes (Signed)
[name]     MRN: [id number]      DOB: 11-Aug-1941   HPI [name] is here with main c/o bilateral leg swelling for past 4 days, she also has for follow up and re-evaluation of chronic medical conditions, medication management and review of any available recent lab and radiology data.  Preventive health is updated, specifically  Cancer screening and Immunization.   Questions or concerns regarding consultations or procedures which the PT has had in the interim are  addressed. The PT denies any adverse reactions to current medications since the last visit.  Denies PND, orthopnea , palpitations or chest pain. C/o blood sugar lows in the mornings  ROS Denies recent fever or chills. Denies sinus pressure, nasal congestion, ear pain or sore throat. Denies chest congestion, productive cough or wheezing. Denies abdominal pain, nausea, vomiting,diarrhea or constipation.   Denies dysuria, frequency, hesitancy or incontinence. Denies uncontrolled  joint pain, swelling and limitation in mobility. Denies headaches, seizures, numbness, or tingling. Denies uncontrolled  depression, anxiety or insomnia. Denies skin break down or rash.   PE  BP 140/64   Pulse 90   Resp 16   Ht 5\' 6"  (1.676 m)   Wt 274 lb (124.3 kg)   SpO2 96%   BMI 44.22 kg/m   Patient alert and oriented and in no cardiopulmonary distress.  HEENT: No facial asymmetry, EOMI,   oropharynx pink and moist.  Neck supple no JVD, no mass.  Chest: Clear to auscultation bilaterally.  CVS: S1, S2 no murmurs, no S3.Regular rate.  ABD: Soft non tender.   Ext: 2 plus pitting edema  MS: Decreased  ROM spine, shoulders, hips and knees.  Skin: Intact, no ulcerations or rash noted.  Psych: Good eye contact, normal affect. Memory intact not anxious or depressed appearing.  CNS: CN 2-12 intact, power,  normal throughout.no focal deficits noted.   Assessment & Plan  Chronic renal disease, stage 3, moderately decreased  glomerular filtration rate (GFR) between 30-59 mL/min/1.73 square meter Deterioration in renal function causing leg swelling, will r/o CHF with BNP test. Start diuretics and re eval in next 10 to 14 days Stop metformin, has hypoglycemia and impaired renal function  Diabetes mellitus, insulin dependent (IDDM), controlled ([place]) Overcorrecte, stop metaglip [name] is reminded of the importance of commitment to daily physical activity for 30 minutes or more, as able and the need to limit carbohydrate intake to 30 to 60 grams per meal to help with blood sugar control.   The need to take medication as prescribed, test blood sugar as directed, and to call between visits if there is a concern that blood sugar is uncontrolled is also discussed.   [name] is reminded of the importance of daily foot exam, annual eye examination, and good blood sugar, blood pressure and cholesterol control.  Diabetic Labs Latest Ref Rng & Units 11/29/2016 08/15/2016 05/03/2016 03/16/2016 11/30/2015  HbA1c <5.7 % 6.4(H) 6.7(H) 6.6(H) - -  Microalbumin Not estab mg/dL - - - - -  Micro/Creat Ratio <30 mcg/mg creat - - - - -  Chol 125 - 200 mg/dL - - 181 - -  HDL >=46 mg/dL - - 59 - -  Calc LDL <130 mg/dL - - 108 - -  Triglycerides <150 mg/dL - - 68 - -  Creatinine 0.60 - 0.93 mg/dL 1.29(H) 0.93 - 0.86 1.41(H)   BP/Weight 11/29/2016 10/05/2016 09/11/2016 08/31/2016 08/15/2016 04/11/2016 123XX123  Systolic BP XX123456 XX123456 0000000 Q000111Q 146 138 176  Diastolic BP 64 56 83 61 70 70 65  Wt. (Lbs) 274 250 247 248 253 247 244  BMI 44.22 40.35 39.87 40.03 40.84 39.89 39.4   Foot/eye exam completion dates Latest Ref Rng & Units 08/15/2016 04/27/2016  Eye Exam No Retinopathy - Retinopathy(A)  Foot Form Completion - Done -        Depression with anxiety Controlled, no change in medication   Essential hypertension, benign Elevated systolic likely due to fluid retention, no med change DASH diet and commitment to daily physical  activity for a minimum of 30 minutes discussed and encouraged, as a part of hypertension management. The importance of attaining a healthy weight is also discussed.  BP/Weight 11/29/2016 10/05/2016 09/11/2016 08/31/2016 08/15/2016 04/11/2016 [id number]  Systolic BP XX123456 XX123456 0000000 Q000111Q 123456 0000000 0000000  Diastolic BP 64 56 83 61 70 70 65  Wt. (Lbs) 274 250 247 248 253 247 244  BMI 44.22 40.35 39.87 40.03 40.84 39.89 39.4       Morbid obesity Deteriorated. Patient re-educated about  the importance of commitment to a  minimum of 150 minutes of exercise per week.  The importance of healthy food choices with portion control discussed. Encouraged to start a food diary, count calories and to consider  joining a support group. Sample diet sheets offered. Goals set by the patient for the next several months.   Weight /BMI 11/29/2016 10/05/2016 09/11/2016  WEIGHT 274 lb 250 lb 247 lb  HEIGHT 5\' 6"  - 5\' 6"   BMI 44.22 kg/m2 40.35 kg/m2 39.87 kg/m2  excessive weight gain attributed in great part to fluid retention, wll re assess in next 2 weeks    Hypothyroidism Updated lab needed at/ before next visit.   Hyperlipemia Hyperlipidemia:Low fat diet discussed and encouraged. Updated lab needed at/ before next visit.    Lipid Panel  Lab Results  Component Value Date   CHOL 181 05/03/2016   HDL 59 05/03/2016   LDLCALC 108 05/03/2016   TRIG 68 05/03/2016   CHOLHDL 3.1 05/03/2016

## 2016-12-02 NOTE — Assessment & Plan Note (Signed)
Controlled, no change in medication  

## 2016-12-03 ENCOUNTER — Ambulatory Visit: Payer: Medicare Other | Admitting: Orthopedic Surgery

## 2016-12-05 ENCOUNTER — Ambulatory Visit: Payer: Medicare Other | Admitting: Orthopedic Surgery

## 2016-12-11 ENCOUNTER — Ambulatory Visit: Payer: Medicare Other | Admitting: Family Medicine

## 2016-12-11 ENCOUNTER — Ambulatory Visit (INDEPENDENT_AMBULATORY_CARE_PROVIDER_SITE_OTHER): Payer: Medicare Other | Admitting: Family Medicine

## 2016-12-11 ENCOUNTER — Encounter: Payer: Self-pay | Admitting: Family Medicine

## 2016-12-11 VITALS — BP 171/80 | HR 85 | Resp 16 | Ht 66.0 in | Wt 252.0 lb

## 2016-12-11 DIAGNOSIS — Z794 Long term (current) use of insulin: Secondary | ICD-10-CM

## 2016-12-11 DIAGNOSIS — E119 Type 2 diabetes mellitus without complications: Secondary | ICD-10-CM

## 2016-12-11 DIAGNOSIS — K219 Gastro-esophageal reflux disease without esophagitis: Secondary | ICD-10-CM | POA: Diagnosis not present

## 2016-12-11 DIAGNOSIS — N39498 Other specified urinary incontinence: Secondary | ICD-10-CM

## 2016-12-11 DIAGNOSIS — I10 Essential (primary) hypertension: Secondary | ICD-10-CM

## 2016-12-11 DIAGNOSIS — IMO0001 Reserved for inherently not codable concepts without codable children: Secondary | ICD-10-CM

## 2016-12-11 NOTE — Assessment & Plan Note (Signed)
Over corrected,  With hypoglycemia ,d/c metaglip [name] is reminded of the importance of commitment to daily physical activity for 30 minutes or more, as able and the need to limit carbohydrate intake to 30 to 60 grams per meal to help with blood sugar control.   The need to take medication as prescribed, test blood sugar as directed, and to call between visits if there is a concern that blood sugar is uncontrolled is also discussed.   [name] is reminded of the importance of daily foot exam, annual eye examination, and good blood sugar, blood pressure and cholesterol control.  Diabetic Labs Latest Ref Rng & Units 11/29/2016 08/15/2016 05/03/2016 03/16/2016 11/30/2015  HbA1c <5.7 % 6.4(H) 6.7(H) 6.6(H) - -  Microalbumin Not estab mg/dL - - - - -  Micro/Creat Ratio <30 mcg/mg creat - - - - -  Chol 125 - 200 mg/dL - - 181 - -  HDL >=46 mg/dL - - 59 - -  Calc LDL <130 mg/dL - - 108 - -  Triglycerides <150 mg/dL - - 68 - -  Creatinine 0.60 - 0.93 mg/dL 1.29(H) 0.93 - 0.86 1.41(H)   BP/Weight 12/11/2016 11/29/2016 10/05/2016 09/11/2016 08/31/2016 08/15/2016 [id number]  Systolic BP XX123456 XX123456 XX123456 0000000 Q000111Q 123456 0000000  Diastolic BP 80 64 56 83 61 70 70  Wt. (Lbs) 252 274 250 247 248 253 247  BMI 40.67 44.22 40.35 39.87 40.03 40.84 39.89   Foot/eye exam completion dates Latest Ref Rng & Units 08/15/2016 04/27/2016  Eye Exam No Retinopathy - Retinopathy(A)  Foot Form Completion - Done -

## 2016-12-11 NOTE — Assessment & Plan Note (Signed)
Uncontrolled, doubt medication compliance thoughj she gets pre packaged medication. RE educated re names of medications she takes for bP and that she is tom ascertain she takes as prescribed DASH diet and commitment to daily physical activity for a minimum of 30 minutes discussed and encouraged, as a part of hypertension management. The importance of attaining a healthy weight is also discussed.  BP/Weight 12/11/2016 11/29/2016 10/05/2016 09/11/2016 08/31/2016 08/15/2016 [id number]  Systolic BP XX123456 XX123456 XX123456 0000000 Q000111Q 123456 0000000  Diastolic BP 80 64 56 83 61 70 70  Wt. (Lbs) 252 274 250 247 248 253 247  BMI 40.67 44.22 40.35 39.87 40.03 40.84 39.89     May get [facility] involvement

## 2016-12-11 NOTE — Progress Notes (Signed)
[name]     MRN: [id number]      DOB: November 28, 1941   HPI [name] is here for follow up and re-evaluation of chronic medical conditions, medication management and review of any available recent lab and radiology data.  Preventive health is updated, specifically  Cancer screening and Immunization.   Questions or concerns regarding consultations or procedures which the PT has had in the interim are  addressed. The PT denies any adverse reactions to current medications since the last visit.  Ongoing disabling urinary incontinence, frustrated and depressed, unable to leave her home for 1 hr  Excellent response to diuretic with 22 pound weight loss. Uncertain if she has been compliant with blood pressure medication, which I doubt , will go home and review this, may also need [facility] involvement , had similar problem in the Summer, and may be a aprt of the trigger for the decompensation. Experiences hypoglycemia with a low HBA1C, will discontinue oral meds and maintain only insulin   ROS Denies recent fever or chills. Denies sinus pressure, nasal congestion, ear pain or sore throat. Denies chest congestion, productive cough or wheezing. Denies chest pains, palpitations and leg swelling Denies abdominal pain, nausea, vomiting,diarrhea or constipation.   Denies dysuria, frequency, hesitancy  Denies uncontrolled  joint pain, swelling and limitation in mobility. Denies headaches, seizures, numbness, or tingling. Denies uncontrolled  depression, anxiety or insomnia.she is maintained on medication Denies skin break down or rash.   PE  BP (!) 171/80   Pulse 85   Resp 16   Ht 5\' 6"  (1.676 m)   Wt 252 lb (114.3 kg)   SpO2 96%   BMI 40.67 kg/m   Patient alert and oriented and in no cardiopulmonary distress.  HEENT: No facial asymmetry, EOMI,   oropharynx pink and moist.  Neck supple no JVD, no mass.  Chest: Clear to auscultation bilaterally.  CVS: S1, S2 no murmurs, no S3.Regular  rate.  ABD: Soft non tender.   Ext: No edema  MS: Adequate  Though reduced ROM spine, shoulders, hips and knees.  Skin: Intact, no ulcerations or rash noted.  Psych: Good eye contact, normal affect. Memory intact not anxious or depressed appearing.  CNS: CN 2-12 intact, power,  normal throughout.no focal deficits noted.   Assessment & Plan  Essential hypertension, benign Uncontrolled, doubt medication compliance thoughj she gets pre packaged medication. RE educated re names of medications she takes for bP and that she is tom ascertain she takes as prescribed DASH diet and commitment to daily physical activity for a minimum of 30 minutes discussed and encouraged, as a part of hypertension management. The importance of attaining a healthy weight is also discussed.  BP/Weight 12/11/2016 11/29/2016 10/05/2016 09/11/2016 08/31/2016 08/15/2016 99991111  Systolic BP XX123456 XX123456 XX123456 0000000 Q000111Q 123456 0000000  Diastolic BP 80 64 56 83 61 70 70  Wt. (Lbs) 252 274 250 247 248 253 247  BMI 40.67 44.22 40.35 39.87 40.03 40.84 39.89     May get [facility] involvement  Urinary incontinence Uncontrolled, no improvement, despite multiple attempts by urology including  Botox  Diabetes mellitus, insulin dependent (IDDM), controlled ([place]) Over corrected,  With hypoglycemia ,d/c metaglip [name] is reminded of the importance of commitment to daily physical activity for 30 minutes or more, as able and the need to limit carbohydrate intake to 30 to 60 grams per meal to help with blood sugar control.   The need to take medication as prescribed, test blood sugar  as directed, and to call between visits if there is a concern that blood sugar is uncontrolled is also discussed.   [name] is reminded of the importance of daily foot exam, annual eye examination, and good blood sugar, blood pressure and cholesterol control.  Diabetic Labs Latest Ref Rng & Units 11/29/2016 08/15/2016 05/03/2016 03/16/2016 11/30/2015  HbA1c  <5.7 % 6.4(H) 6.7(H) 6.6(H) - -  Microalbumin Not estab mg/dL - - - - -  Micro/Creat Ratio <30 mcg/mg creat - - - - -  Chol 125 - 200 mg/dL - - 181 - -  HDL >=46 mg/dL - - 59 - -  Calc LDL <130 mg/dL - - 108 - -  Triglycerides <150 mg/dL - - 68 - -  Creatinine 0.60 - 0.93 mg/dL 1.29(H) 0.93 - 0.86 1.41(H)   BP/Weight 12/11/2016 11/29/2016 10/05/2016 09/11/2016 08/31/2016 08/15/2016 [id number]  Systolic BP XX123456 XX123456 XX123456 0000000 Q000111Q 123456 0000000  Diastolic BP 80 64 56 83 61 70 70  Wt. (Lbs) 252 274 250 247 248 253 247  BMI 40.67 44.22 40.35 39.87 40.03 40.84 39.89   Foot/eye exam completion dates Latest Ref Rng & Units 08/15/2016 04/27/2016  Eye Exam No Retinopathy - Retinopathy(A)  Foot Form Completion - Done -        GERD Controlled, no change in medication

## 2016-12-11 NOTE — Assessment & Plan Note (Signed)
Uncontrolled, no improvement, despite multiple attempts by urology including  Botox

## 2016-12-11 NOTE — Patient Instructions (Addendum)
F/u in 4 weeks, call if you need me sooner  Lab today.  PLEASE ensure you are taking aS prescribed  For high blood pressure Amlodipine, diltiazem, spironolactone, cozaar and hydralazine, bP is high   STOP metaglip, only take insulin, blood sugar is low   STOP zaroxolyn only take lasix , you have lost 22 pounds of fluid since last visit

## 2016-12-11 NOTE — Assessment & Plan Note (Signed)
Controlled, no change in medication  

## 2016-12-12 ENCOUNTER — Other Ambulatory Visit (HOSPITAL_COMMUNITY)
Admission: RE | Admit: 2016-12-12 | Discharge: 2016-12-12 | Disposition: A | Discharge status: Home / Self Care | Payer: Medicare Other | Source: Other Acute Inpatient Hospital | Attending: Family Medicine | Admitting: Family Medicine

## 2016-12-12 DIAGNOSIS — E119 Type 2 diabetes mellitus without complications: Secondary | ICD-10-CM | POA: Diagnosis not present

## 2016-12-13 LAB — BASIC METABOLIC PANEL WITH GFR
BUN: 13 mg/dL (ref 7–25)
CALCIUM: 9.1 mg/dL (ref 8.6–10.4)
CHLORIDE: 104 mmol/L (ref 98–110)
CO2: 25 mmol/L (ref 20–31)
CREATININE: 0.93 mg/dL (ref 0.60–0.93)
GFR, Est African American: 71 mL/min (ref >=60)
GFR, Est Non African American: 61 mL/min (ref >=60)
Glucose, Bld: 190 mg/dL — ABNORMAL HIGH (ref 65–99)
Potassium: 4.4 mmol/L (ref 3.5–5.3)
Sodium: 137 mmol/L (ref 135–146)

## 2016-12-13 LAB — MICROALBUMIN / CREATININE URINE RATIO
Creatinine, Urine: 128.4 mg/dL
MICROALB UR: 617.3 ug/mL — AB
Microalb Creat Ratio: 480.8 mg/g creat — ABNORMAL HIGH (ref 0.0–30.0)

## 2016-12-17 ENCOUNTER — Other Ambulatory Visit: Payer: Self-pay | Admitting: Family Medicine

## 2016-12-18 ENCOUNTER — Other Ambulatory Visit: Payer: Self-pay | Admitting: Family Medicine

## 2016-12-21 ENCOUNTER — Other Ambulatory Visit: Payer: Self-pay

## 2016-12-21 MED ORDER — HYDROCODONE-ACETAMINOPHEN 10-325 MG PO TABS: ORAL_TABLET | ORAL | 0 refills | Status: DC

## 2017-01-07 ENCOUNTER — Ambulatory Visit: Payer: Medicare Other | Admitting: Orthopedic Surgery

## 2017-01-14 ENCOUNTER — Ambulatory Visit (INDEPENDENT_AMBULATORY_CARE_PROVIDER_SITE_OTHER): Payer: Medicare Other | Admitting: Family Medicine

## 2017-01-14 ENCOUNTER — Other Ambulatory Visit: Payer: Self-pay | Admitting: Family Medicine

## 2017-01-14 ENCOUNTER — Encounter: Payer: Self-pay | Admitting: Family Medicine

## 2017-01-14 VITALS — BP 148/80 | HR 70 | Resp 16 | Ht 66.0 in | Wt 252.0 lb

## 2017-01-14 DIAGNOSIS — N183 Chronic kidney disease, stage 3 unspecified: Secondary | ICD-10-CM

## 2017-01-14 DIAGNOSIS — D631 Anemia in chronic kidney disease: Secondary | ICD-10-CM

## 2017-01-14 DIAGNOSIS — R290 Tetany: Secondary | ICD-10-CM | POA: Diagnosis not present

## 2017-01-14 DIAGNOSIS — G4733 Obstructive sleep apnea (adult) (pediatric): Secondary | ICD-10-CM | POA: Diagnosis not present

## 2017-01-14 DIAGNOSIS — E119 Type 2 diabetes mellitus without complications: Secondary | ICD-10-CM | POA: Diagnosis not present

## 2017-01-14 DIAGNOSIS — IMO0001 Reserved for inherently not codable concepts without codable children: Secondary | ICD-10-CM

## 2017-01-14 DIAGNOSIS — E559 Vitamin D deficiency, unspecified: Secondary | ICD-10-CM

## 2017-01-14 DIAGNOSIS — E032 Hypothyroidism due to medicaments and other exogenous substances: Secondary | ICD-10-CM | POA: Diagnosis not present

## 2017-01-14 DIAGNOSIS — Z794 Long term (current) use of insulin: Secondary | ICD-10-CM

## 2017-01-14 DIAGNOSIS — G7000 Myasthenia gravis without (acute) exacerbation: Secondary | ICD-10-CM | POA: Diagnosis not present

## 2017-01-14 DIAGNOSIS — I10 Essential (primary) hypertension: Secondary | ICD-10-CM

## 2017-01-14 DIAGNOSIS — E782 Mixed hyperlipidemia: Secondary | ICD-10-CM

## 2017-01-14 NOTE — Patient Instructions (Addendum)
F/u with 3. months, call if you need me sooner  Fasting lipid, cmp and eGFr and magnesium level is am , CBC and anemia panel, TSH and vit D level in morning  Reconsider knee replacement as you wiill likely get SNF coverage short term post op    You are referred to Dr Merlene Laughter to re eval;uate myasthenia and sleep apnea  Thank you  for choosing [facility]. We consider it a privelige to serve you.  Delivering excellent health care in a caring and  compassionate way is our goal.  Partnering with you,  so that together we can achieve this goal is our strategy.

## 2017-01-16 LAB — CBC
HEMATOCRIT: 28.4 % — AB (ref 35.0–45.0)
Hemoglobin: 8.9 g/dL — ABNORMAL LOW (ref 11.7–15.5)
MCH: 26.6 pg — ABNORMAL LOW (ref 27.0–33.0)
MCHC: 31.3 g/dL — ABNORMAL LOW (ref 32.0–36.0)
MCV: 85 fL (ref 80.0–100.0)
MPV: 11.6 fL (ref 7.5–12.5)
Platelets: 298 10*3/uL (ref 140–400)
RBC: 3.34 MIL/uL — ABNORMAL LOW (ref 3.80–5.10)
RDW: 16.9 % — AB (ref 11.0–15.0)
WBC: 6.3 10*3/uL (ref 3.8–10.8)

## 2017-01-16 LAB — LIPID PANEL
CHOL/HDL RATIO: 4.8 ratio (ref <5.0)
Cholesterol: 188 mg/dL (ref <200)
HDL: 39 mg/dL — ABNORMAL LOW (ref >50)
LDL Cholesterol: 128 mg/dL — ABNORMAL HIGH (ref <100)
Triglycerides: 105 mg/dL (ref <150)
VLDL: 21 mg/dL (ref <30)

## 2017-01-16 LAB — COMPLETE METABOLIC PANEL WITH GFR
ALBUMIN: 3.3 g/dL — AB (ref 3.6–5.1)
ALK PHOS: 110 U/L (ref 33–130)
ALT: 10 U/L (ref 6–29)
AST: 16 U/L (ref 10–35)
BUN: 19 mg/dL (ref 7–25)
CO2: 26 mmol/L (ref 20–31)
Calcium: 9.2 mg/dL (ref 8.6–10.4)
Chloride: 106 mmol/L (ref 98–110)
Creat: 1.37 mg/dL — ABNORMAL HIGH (ref 0.60–0.93)
GFR, Est African American: 44 mL/min — ABNORMAL LOW (ref >=60)
GFR, Est Non African American: 38 mL/min — ABNORMAL LOW (ref >=60)
GLUCOSE: 96 mg/dL (ref 65–99)
POTASSIUM: 4.6 mmol/L (ref 3.5–5.3)
SODIUM: 140 mmol/L (ref 135–146)
Total Bilirubin: 0.4 mg/dL (ref 0.2–1.2)
Total Protein: 6.3 g/dL (ref 6.1–8.1)

## 2017-01-16 LAB — MAGNESIUM: Magnesium: 1.8 mg/dL (ref 1.5–2.5)

## 2017-01-16 LAB — VITAMIN D 25 HYDROXY (VIT D DEFICIENCY, FRACTURES): Vit D, 25-Hydroxy: 44 ng/mL (ref 30–100)

## 2017-01-16 LAB — TSH: TSH: 0.91 mIU/L

## 2017-01-18 DIAGNOSIS — R290 Tetany: Secondary | ICD-10-CM | POA: Insufficient documentation

## 2017-01-18 NOTE — Assessment & Plan Note (Signed)
Sub optimal control, no med change DASH diet and commitment to daily physical activity for a minimum of 30 minutes discussed and encouraged, as a part of hypertension management. The importance of attaining a healthy weight is also discussed.  BP/Weight 01/14/2017 12/11/2016 11/29/2016 10/05/2016 09/11/2016 08/31/2016 99991111  Systolic BP 123456 XX123456 XX123456 XX123456 0000000 Q000111Q 123456  Diastolic BP 80 80 64 56 83 61 70  Wt. (Lbs) 252 252 274 250 247 248 253  BMI 40.67 40.67 44.22 40.35 39.87 40.03 40.84

## 2017-01-18 NOTE — Assessment & Plan Note (Signed)
Uncontrolled on max dose crestor, dietray management , no med changes Updated lab needed at/ before next visit. Hyperlipidemia:Low fat diet discussed and encouraged.   Lipid Panel  Lab Results  Component Value Date   CHOL 188 01/14/2017   HDL 39 (L) 01/14/2017   LDLCALC 128 (H) 01/14/2017   TRIG 105 01/14/2017   CHOLHDL 4.8 01/14/2017

## 2017-01-18 NOTE — Assessment & Plan Note (Signed)
Deterioration with marked anemia , refer to hematology for eval and management

## 2017-01-18 NOTE — Assessment & Plan Note (Signed)
New onset, disrupting sleep, electrolytes within normal, neurology to assess

## 2017-01-18 NOTE — Progress Notes (Signed)
[name]     MRN: [id number]      DOB: [birth year]   HPI [name] is here for follow up and re-evaluation of chronic medical conditions, medication management and review of any available recent lab and radiology data.  Preventive health is updated, specifically  Cancer screening and Immunization.   Questions or concerns regarding consultations or procedures which the PT has had in the interim are  addressed. The PT denies any adverse reactions to current medications since the last visit.  Main concern is of increased spasms in hands and feet which disturb her sleep  ROS Denies recent fever or chills. Denies sinus pressure, nasal congestion, ear pain or sore throat. Denies chest congestion, productive cough or wheezing. Denies chest pains, palpitations and leg swelling Denies abdominal pain, nausea, vomiting,diarrhea or constipation.   Denies dysuria, frequency, hesitancy or incontinence. . Denies headaches, seizures, numbness, or tingling. Denies depression, anxiety or insomnia. Denies skin break down or rash.   PE  BP (!) 148/80   Pulse 70   Resp 16   Ht 5\' 6"  (1.676 m)   Wt 252 lb (114.3 kg)   SpO2 97%   BMI 40.67 kg/m   Patient alert and oriented and in no cardiopulmonary distress.  HEENT: No facial asymmetry, EOMI,   oropharynx pink and moist.  Neck supple no JVD, no mass.  Chest: Clear to auscultation bilaterally.  CVS: S1, S2 no murmurs, no S3.Regular rate.  ABD: Soft non tender.   Ext: No edema  [id number] though decreased ROM spine, shoulders, hips and knees.  Skin: Intact, no ulcerations or rash noted.  Psych: Good eye contact, normal affect. Memory intact not anxious or depressed appearing.  CNS: CN 2-12 intact, power,  normal throughout.no focal deficits noted.   Assessment & Plan  Anemia in chronic renal disease Deterioration with marked anemia , refer to hematology for eval and management  Myasthenia gravis Reports worsening of  symptoms , fatigue and muscle weakness, requests neurology re eval, will refer to local neurologist  Obstructive sleep apnea excessive fatigue and excessive daytime sleepiness, refer to neurology for evaluation and management  Essential hypertension, benign Sub optimal control, no med change DASH diet and commitment to daily physical activity for a minimum of 30 minutes discussed and encouraged, as a part of hypertension management. The importance of attaining a healthy weight is also discussed.  BP/Weight 01/14/2017 12/11/2016 11/29/2016 10/05/2016 09/11/2016 08/31/2016 [id number]  Systolic BP 123456 XX123456 XX123456 XX123456 0000000 Q000111Q 123456  Diastolic BP 80 80 64 56 83 61 70  Wt. (Lbs) 252 252 274 250 247 248 253  BMI 40.67 40.67 44.22 40.35 39.87 40.03 40.84       Hyperlipemia Uncontrolled on max dose crestor, dietray management , no med changes Updated lab needed at/ before next visit. Hyperlipidemia:Low fat diet discussed and encouraged.   Lipid Panel  Lab Results  Component Value Date   CHOL 188 01/14/2017   HDL 39 (L) 01/14/2017   LDLCALC 128 (H) 01/14/2017   TRIG 105 01/14/2017   CHOLHDL 4.8 01/14/2017       Diabetes mellitus, insulin dependent (IDDM), controlled ([place]) [name] is reminded of the importance of commitment to daily physical activity for 30 minutes or more, as able and the need to limit carbohydrate intake to 30 to 60 grams per meal to help with blood sugar control.   The need to take medication as prescribed, test blood sugar as directed, and to call between visits if  there is a concern that blood sugar is uncontrolled is also discussed.   [name] is reminded of the importance of daily foot exam, annual eye examination, and good blood sugar, blood pressure and cholesterol control. Updated lab needed at/ before next visit.   Diabetic Labs Latest Ref Rng & Units 01/14/2017 12/12/2016 12/11/2016 11/29/2016 08/15/2016  HbA1c <5.7 % - - - 6.4(H) 6.7(H)  Microalbumin  Not Estab. ug/mL - 617.3(H) - - -  Micro/Creat Ratio 0.0 - 30.0 mg/g creat - 480.8(H) - - -  Chol <200 mg/dL 188 - - - -  HDL >50 mg/dL 39(L) - - - -  Calc LDL <100 mg/dL 128(H) - - - -  Triglycerides <150 mg/dL 105 - - - -  Creatinine 0.60 - 0.93 mg/dL 1.37(H) - 0.93 1.29(H) 0.93   BP/Weight 01/14/2017 12/11/2016 11/29/2016 10/05/2016 09/11/2016 08/31/2016 [id number]  Systolic BP 123456 XX123456 XX123456 XX123456 0000000 Q000111Q 123456  Diastolic BP 80 80 64 56 83 61 70  Wt. (Lbs) 252 252 274 250 247 248 253  BMI 40.67 40.67 44.22 40.35 39.87 40.03 40.84   Foot/eye exam completion dates Latest Ref Rng & Units 08/15/2016 04/27/2016  Eye Exam No Retinopathy - Retinopathy(A)  Foot Form Completion - Done -        Carpopedal spasm New onset, disrupting sleep, electrolytes within normal, neurology to assess

## 2017-01-18 NOTE — Assessment & Plan Note (Addendum)
excessive fatigue and excessive daytime sleepiness, refer to neurology for evaluation and management

## 2017-01-18 NOTE — Assessment & Plan Note (Signed)
Reports worsening of symptoms , fatigue and muscle weakness, requests neurology re eval, will refer to local neurologist

## 2017-01-18 NOTE — Assessment & Plan Note (Signed)
[name] is reminded of the importance of commitment to daily physical activity for 30 minutes or more, as able and the need to limit carbohydrate intake to 30 to 60 grams per meal to help with blood sugar control.   The need to take medication as prescribed, test blood sugar as directed, and to call between visits if there is a concern that blood sugar is uncontrolled is also discussed.   [name] is reminded of the importance of daily foot exam, annual eye examination, and good blood sugar, blood pressure and cholesterol control. Updated lab needed at/ before next visit.   Diabetic Labs Latest Ref Rng & Units 01/14/2017 12/12/2016 12/11/2016 11/29/2016 08/15/2016  HbA1c <5.7 % - - - 6.4(H) 6.7(H)  Microalbumin Not Estab. ug/mL - 617.3(H) - - -  Micro/Creat Ratio 0.0 - 30.0 mg/g creat - 480.8(H) - - -  Chol <200 mg/dL 188 - - - -  HDL >50 mg/dL 39(L) - - - -  Calc LDL <100 mg/dL 128(H) - - - -  Triglycerides <150 mg/dL 105 - - - -  Creatinine 0.60 - 0.93 mg/dL 1.37(H) - 0.93 1.29(H) 0.93   BP/Weight 01/14/2017 12/11/2016 11/29/2016 10/05/2016 09/11/2016 08/31/2016 [id number]  Systolic BP 123456 XX123456 XX123456 XX123456 0000000 Q000111Q 123456  Diastolic BP 80 80 64 56 83 61 70  Wt. (Lbs) 252 252 274 250 247 248 253  BMI 40.67 40.67 44.22 40.35 39.87 40.03 40.84   Foot/eye exam completion dates Latest Ref Rng & Units 08/15/2016 04/27/2016  Eye Exam No Retinopathy - Retinopathy(A)  Foot Form Completion - Done -

## 2017-01-23 ENCOUNTER — Ambulatory Visit: Payer: Medicare Other

## 2017-01-29 ENCOUNTER — Ambulatory Visit (INDEPENDENT_AMBULATORY_CARE_PROVIDER_SITE_OTHER): Payer: Medicare Other | Admitting: Ophthalmology

## 2017-01-30 ENCOUNTER — Other Ambulatory Visit: Payer: Self-pay

## 2017-01-30 ENCOUNTER — Other Ambulatory Visit: Payer: Self-pay | Admitting: Family Medicine

## 2017-01-30 DIAGNOSIS — R42 Dizziness and giddiness: Secondary | ICD-10-CM | POA: Diagnosis not present

## 2017-01-30 DIAGNOSIS — M130 Polyarthritis, unspecified: Secondary | ICD-10-CM | POA: Diagnosis not present

## 2017-01-30 DIAGNOSIS — G7000 Myasthenia gravis without (acute) exacerbation: Secondary | ICD-10-CM | POA: Diagnosis not present

## 2017-01-30 DIAGNOSIS — G4733 Obstructive sleep apnea (adult) (pediatric): Secondary | ICD-10-CM | POA: Diagnosis not present

## 2017-01-30 DIAGNOSIS — E119 Type 2 diabetes mellitus without complications: Secondary | ICD-10-CM | POA: Diagnosis not present

## 2017-01-30 MED ORDER — HYDROCODONE-ACETAMINOPHEN 10-325 MG PO TABS: ORAL_TABLET | ORAL | 0 refills | Status: DC

## 2017-02-05 ENCOUNTER — Ambulatory Visit (INDEPENDENT_AMBULATORY_CARE_PROVIDER_SITE_OTHER): Payer: Medicare Other | Admitting: Family Medicine

## 2017-02-05 VITALS — BP 140/60 | HR 86 | Resp 14 | Ht 66.0 in | Wt 252.0 lb

## 2017-02-05 DIAGNOSIS — E782 Mixed hyperlipidemia: Secondary | ICD-10-CM | POA: Diagnosis not present

## 2017-02-05 DIAGNOSIS — IMO0001 Reserved for inherently not codable concepts without codable children: Secondary | ICD-10-CM

## 2017-02-05 DIAGNOSIS — E119 Type 2 diabetes mellitus without complications: Secondary | ICD-10-CM

## 2017-02-05 DIAGNOSIS — G8929 Other chronic pain: Secondary | ICD-10-CM

## 2017-02-05 DIAGNOSIS — I10 Essential (primary) hypertension: Secondary | ICD-10-CM

## 2017-02-05 DIAGNOSIS — Z794 Long term (current) use of insulin: Secondary | ICD-10-CM

## 2017-02-05 DIAGNOSIS — D508 Other iron deficiency anemias: Secondary | ICD-10-CM

## 2017-02-05 MED ORDER — HYDROCODONE-ACETAMINOPHEN 10-325 MG PO TABS: ORAL_TABLET | ORAL | 0 refills | Status: DC

## 2017-02-05 MED ORDER — ACETAMINOPHEN 500 MG PO TABS: ORAL_TABLET | ORAL | 5 refills | Status: DC

## 2017-02-05 NOTE — Patient Instructions (Addendum)
Pain visit  13 to 14 weeks from today  Reduced hydrocodone to one twice daily, starting today  Take TWO gabapentin at bedtime, and one tylenol 500 mg at bedtime for pain  If you vomit bright red blood you need to go directly to the ED    Fasting lipid, cmp and EGFr, hBA1C, CBC for next visit

## 2017-02-09 ENCOUNTER — Encounter: Payer: Self-pay | Admitting: Family Medicine

## 2017-02-09 DIAGNOSIS — R52 Pain, unspecified: Secondary | ICD-10-CM | POA: Insufficient documentation

## 2017-02-09 DIAGNOSIS — G8929 Other chronic pain: Secondary | ICD-10-CM | POA: Insufficient documentation

## 2017-02-09 NOTE — Assessment & Plan Note (Signed)
Controlled, no change in medication [name] is reminded of the importance of commitment to daily physical activity for 30 minutes or more, as able and the need to limit carbohydrate intake to 30 to 60 grams per meal to help with blood sugar control.   The need to take medication as prescribed, test blood sugar as directed, and to call between visits if there is a concern that blood sugar is uncontrolled is also discussed.   [name] is reminded of the importance of daily foot exam, annual eye examination, and good blood sugar, blood pressure and cholesterol control.  Diabetic Labs Latest Ref Rng & Units 01/14/2017 12/12/2016 12/11/2016 11/29/2016 08/15/2016  HbA1c <5.7 % - - - 6.4(H) 6.7(H)  Microalbumin Not Estab. ug/mL - 617.3(H) - - -  Micro/Creat Ratio 0.0 - 30.0 mg/g creat - 480.8(H) - - -  Chol <200 mg/dL 188 - - - -  HDL >50 mg/dL 39(L) - - - -  Calc LDL <100 mg/dL 128(H) - - - -  Triglycerides <150 mg/dL 105 - - - -  Creatinine 0.60 - 0.93 mg/dL 1.37(H) - 0.93 1.29(H) 0.93   BP/Weight 02/05/2017 01/14/2017 12/11/2016 11/29/2016 10/05/2016 0000000 [id number]  Systolic BP XX123456 123456 XX123456 XX123456 XX123456 0000000 Q000111Q  Diastolic BP 60 80 80 64 56 83 61  Wt. (Lbs) - 252 252 274 250 247 248  BMI - 40.67 40.67 44.22 40.35 39.87 40.03   Foot/eye exam completion dates Latest Ref Rng & Units 08/15/2016 04/27/2016  Eye Exam No Retinopathy - Retinopathy(A)  Foot Form Completion - Done -      Updated lab needed at/ before next visit.

## 2017-02-09 NOTE — Assessment & Plan Note (Signed)
Adequate though sub optimal control, no med change DASH diet and commitment to daily physical activity for a minimum of 30 minutes discussed and encouraged, as a part of hypertension management. The importance of attaining a healthy weight is also discussed.  BP/Weight 02/05/2017 01/14/2017 12/11/2016 11/29/2016 10/05/2016 0000000 [id number]  Systolic BP XX123456 123456 XX123456 XX123456 XX123456 0000000 Q000111Q  Diastolic BP 60 80 80 64 56 83 61  Wt. (Lbs) - 252 252 274 250 247 248  BMI - 40.67 40.67 44.22 40.35 39.87 40.03

## 2017-02-09 NOTE — Progress Notes (Signed)
[name]     MRN: [id number]      DOB: [birth year]   HPI [name] is here for follow up and re-evaluation of chronic medical conditions, medication management and review of any available recent lab and radiology data.  Specifically in for chronic pain management and starting new policy Preventive health is updated, specifically  Cancer screening and Immunization.   Questions or concerns regarding consultations or procedures which the PT has had in the interim are  addressed. The PT denies any adverse reactions to current medications since the last visit.  Denies polyuria, polydipsia, blurred vision , or hypoglycemic episodes.   ROS Denies recent fever or chills. Denies sinus pressure, nasal congestion, ear pain or sore throat. Denies chest congestion, productive cough or wheezing. Denies chest pains, palpitations and leg swelling Denies abdominal pain, nausea, vomiting,diarrhea or constipation.   c/o chronic Denies joint pain, swelling and limitation in mobility. Denies headaches, seizures, numbness, or tingling. Denies depression, anxiety or insomnia. Denies skin break down or rash.   PE  BP 140/60   Pulse 86   Resp 14   Patient alert and oriented and in no cardiopulmonary distress.  HEENT: No facial asymmetry, EOMI,   oropharynx pink and moist.  Neck supple no JVD, no mass.  Chest: Clear to auscultation bilaterally.  CVS: S1, S2 no murmurs, no S3.Regular rate.  ABD: Soft non tender.   Ext: No edema  [id number] though  Adequate ROM spine, shoulders, hips and knees.  Skin: Intact, no ulcerations or rash noted.  Psych: Good eye contact, normal affect. Memory intact not anxious or depressed appearing.  CNS: CN 2-12 intact, power,  normal throughout.no focal deficits noted.   Assessment & Plan  Encounter for chronic pain management Reviewed with patient chronic pain needs, and dose reduction in hydrocodone instituted , with commitment to gabapentin  and tylenol as prescribed. [place] registry of controled substance reviewed. Pain policy reviewed with patient and signed. Three months of script handed to pt  Essential hypertension, benign Adequate though sub optimal control, no med change DASH diet and commitment to daily physical activity for a minimum of 30 minutes discussed and encouraged, as a part of hypertension management. The importance of attaining a healthy weight is also discussed.  BP/Weight 02/05/2017 01/14/2017 12/11/2016 11/29/2016 10/05/2016 0000000 [id number]  Systolic BP XX123456 123456 XX123456 XX123456 XX123456 0000000 Q000111Q  Diastolic BP 60 80 80 64 56 83 61  Wt. (Lbs) - 252 252 274 250 247 248  BMI - 40.67 40.67 44.22 40.35 39.87 40.03       Diabetes mellitus, insulin dependent (IDDM), controlled ([facility]) Controlled, no change in medication [name] is reminded of the importance of commitment to daily physical activity for 30 minutes or more, as able and the need to limit carbohydrate intake to 30 to 60 grams per meal to help with blood sugar control.   The need to take medication as prescribed, test blood sugar as directed, and to call between visits if there is a concern that blood sugar is uncontrolled is also discussed.   [name] is reminded of the importance of daily foot exam, annual eye examination, and good blood sugar, blood pressure and cholesterol control.  Diabetic Labs Latest Ref Rng & Units 01/14/2017 12/12/2016 12/11/2016 11/29/2016 08/15/2016  HbA1c <5.7 % - - - 6.4(H) 6.7(H)  Microalbumin Not Estab. ug/mL - 617.3(H) - - -  Micro/Creat Ratio 0.0 - 30.0 mg/g creat - 480.8(H) - - -  Chol <200  mg/dL 188 - - - -  HDL >50 mg/dL 39(L) - - - -  Calc LDL <100 mg/dL 128(H) - - - -  Triglycerides <150 mg/dL 105 - - - -  Creatinine 0.60 - 0.93 mg/dL 1.37(H) - 0.93 1.29(H) 0.93   BP/Weight 02/05/2017 01/14/2017 12/11/2016 11/29/2016 10/05/2016 0000000 [id number]  Systolic BP XX123456 123456 XX123456 XX123456 XX123456 0000000 Q000111Q  Diastolic BP 60 80 80 64 56 83  61  Wt. (Lbs) - 252 252 274 250 247 248  BMI - 40.67 40.67 44.22 40.35 39.87 40.03   Foot/eye exam completion dates Latest Ref Rng & Units 08/15/2016 04/27/2016  Eye Exam No Retinopathy - Retinopathy(A)  Foot Form Completion - Done -      Updated lab needed at/ before next visit.

## 2017-02-09 NOTE — Assessment & Plan Note (Signed)
Reviewed with patient chronic pain needs, and dose reduction in hydrocodone instituted , with commitment to gabapentin and tylenol as prescribed. [place] registry of controled substance reviewed. Pain policy reviewed with patient and signed. Three months of script handed to pt

## 2017-02-13 ENCOUNTER — Other Ambulatory Visit: Payer: Self-pay | Admitting: Family Medicine

## 2017-02-18 ENCOUNTER — Ambulatory Visit (INDEPENDENT_AMBULATORY_CARE_PROVIDER_SITE_OTHER): Payer: Medicare Other | Admitting: Ophthalmology

## 2017-02-18 DIAGNOSIS — H34831 Tributary (branch) retinal vein occlusion, right eye, with macular edema: Secondary | ICD-10-CM | POA: Diagnosis not present

## 2017-02-18 DIAGNOSIS — H43811 Vitreous degeneration, right eye: Secondary | ICD-10-CM

## 2017-02-18 DIAGNOSIS — H33302 Unspecified retinal break, left eye: Secondary | ICD-10-CM

## 2017-02-18 DIAGNOSIS — H35342 Macular cyst, hole, or pseudohole, left eye: Secondary | ICD-10-CM

## 2017-02-18 DIAGNOSIS — E113392 Type 2 diabetes mellitus with moderate nonproliferative diabetic retinopathy without macular edema, left eye: Secondary | ICD-10-CM

## 2017-02-18 DIAGNOSIS — I10 Essential (primary) hypertension: Secondary | ICD-10-CM | POA: Diagnosis not present

## 2017-02-18 DIAGNOSIS — H35033 Hypertensive retinopathy, bilateral: Secondary | ICD-10-CM

## 2017-02-18 DIAGNOSIS — E113311 Type 2 diabetes mellitus with moderate nonproliferative diabetic retinopathy with macular edema, right eye: Secondary | ICD-10-CM

## 2017-02-18 DIAGNOSIS — H34811 Central retinal vein occlusion, right eye, with macular edema: Secondary | ICD-10-CM | POA: Diagnosis not present

## 2017-02-18 DIAGNOSIS — E11311 Type 2 diabetes mellitus with unspecified diabetic retinopathy with macular edema: Secondary | ICD-10-CM

## 2017-02-18 LAB — HM DIABETES EYE EXAM

## 2017-02-20 ENCOUNTER — Encounter (HOSPITAL_COMMUNITY): Payer: Medicare Other | Admitting: Oncology

## 2017-02-20 NOTE — Assessment & Plan Note (Signed)
severe iron deficiency anemia requiring IV iron therapy without significant improvement in anemia.  EGD on 09/16/2014 demonstrated a Schatzki's ring and hiatal hernia and colonoscopy on 05/08/2012 with internal and external hemorrhoids, colonic diverticulosis. AND Intermittent renal insufficiency. AND Noncompliance having missed follow-up appointment.  Labs today: CBC diff, BMET, iron/TIBC, ferritin, B12, folate, haptoglobin, retic count, pathology smear review, LDH, ESR, CRP, SPEP+IFE, light chain assay, stool cards x 3.  I personally reviewed and went over laboratory results with the patient.  The results are noted within this dictation.  If iron replacement therapy is needed, we will provide IV iron.  Once replaced, if anemia persists, patient will need bone marrow aspiration and biopsy.  Return in 6 weeks for follow-up.  If RBC/HGB is not significantly improved, then we will proceed with ordering and scheduling of a bone marrow aspiration and biopsy. This can be reviewed in more detail in 6 weeks if indicated.  She does have an aortic stenosis murmur.  I wonder if she has some mechanical, extracorpuscular hemolysis as a result.

## 2017-02-20 NOTE — Progress Notes (Signed)
Rescheduled  ROS  

## 2017-02-27 ENCOUNTER — Encounter: Payer: Self-pay | Admitting: Family Medicine

## 2017-03-06 ENCOUNTER — Ambulatory Visit: Payer: Medicare Other | Admitting: Family Medicine

## 2017-03-07 ENCOUNTER — Ambulatory Visit: Payer: Medicare Other | Admitting: Family Medicine

## 2017-03-07 ENCOUNTER — Other Ambulatory Visit: Payer: Self-pay | Admitting: Family Medicine

## 2017-03-08 ENCOUNTER — Other Ambulatory Visit (HOSPITAL_BASED_OUTPATIENT_CLINIC_OR_DEPARTMENT_OTHER): Payer: Self-pay

## 2017-03-08 DIAGNOSIS — G2581 Restless legs syndrome: Secondary | ICD-10-CM

## 2017-03-08 DIAGNOSIS — G4733 Obstructive sleep apnea (adult) (pediatric): Secondary | ICD-10-CM

## 2017-03-13 DIAGNOSIS — M545 Low back pain: Secondary | ICD-10-CM | POA: Diagnosis not present

## 2017-03-13 DIAGNOSIS — G7000 Myasthenia gravis without (acute) exacerbation: Secondary | ICD-10-CM | POA: Diagnosis not present

## 2017-03-13 DIAGNOSIS — G4733 Obstructive sleep apnea (adult) (pediatric): Secondary | ICD-10-CM | POA: Diagnosis not present

## 2017-03-13 DIAGNOSIS — M130 Polyarthritis, unspecified: Secondary | ICD-10-CM | POA: Diagnosis not present

## 2017-03-13 DIAGNOSIS — G2581 Restless legs syndrome: Secondary | ICD-10-CM | POA: Diagnosis not present

## 2017-03-15 ENCOUNTER — Other Ambulatory Visit: Payer: Self-pay | Admitting: Family Medicine

## 2017-03-20 ENCOUNTER — Ambulatory Visit: Payer: Medicare Other | Attending: Neurology | Admitting: Neurology

## 2017-03-20 DIAGNOSIS — G4733 Obstructive sleep apnea (adult) (pediatric): Secondary | ICD-10-CM | POA: Diagnosis not present

## 2017-03-20 DIAGNOSIS — G2581 Restless legs syndrome: Secondary | ICD-10-CM

## 2017-03-20 DIAGNOSIS — M545 Low back pain: Secondary | ICD-10-CM | POA: Diagnosis not present

## 2017-03-25 ENCOUNTER — Encounter (HOSPITAL_COMMUNITY): Payer: Self-pay | Admitting: Oncology

## 2017-03-25 ENCOUNTER — Encounter (HOSPITAL_COMMUNITY): Payer: Medicare Other | Attending: Oncology | Admitting: Oncology

## 2017-03-25 ENCOUNTER — Encounter (HOSPITAL_COMMUNITY): Payer: Medicare Other

## 2017-03-25 DIAGNOSIS — K449 Diaphragmatic hernia without obstruction or gangrene: Secondary | ICD-10-CM | POA: Diagnosis not present

## 2017-03-25 DIAGNOSIS — K222 Esophageal obstruction: Secondary | ICD-10-CM | POA: Insufficient documentation

## 2017-03-25 DIAGNOSIS — Z90710 Acquired absence of both cervix and uterus: Secondary | ICD-10-CM | POA: Diagnosis not present

## 2017-03-25 DIAGNOSIS — I10 Essential (primary) hypertension: Secondary | ICD-10-CM

## 2017-03-25 DIAGNOSIS — I272 Pulmonary hypertension, unspecified: Secondary | ICD-10-CM | POA: Diagnosis not present

## 2017-03-25 DIAGNOSIS — Z833 Family history of diabetes mellitus: Secondary | ICD-10-CM | POA: Insufficient documentation

## 2017-03-25 DIAGNOSIS — Z8249 Family history of ischemic heart disease and other diseases of the circulatory system: Secondary | ICD-10-CM | POA: Insufficient documentation

## 2017-03-25 DIAGNOSIS — G4733 Obstructive sleep apnea (adult) (pediatric): Secondary | ICD-10-CM | POA: Diagnosis not present

## 2017-03-25 DIAGNOSIS — Z96651 Presence of right artificial knee joint: Secondary | ICD-10-CM | POA: Insufficient documentation

## 2017-03-25 DIAGNOSIS — D649 Anemia, unspecified: Secondary | ICD-10-CM | POA: Diagnosis not present

## 2017-03-25 DIAGNOSIS — F419 Anxiety disorder, unspecified: Secondary | ICD-10-CM | POA: Diagnosis not present

## 2017-03-25 DIAGNOSIS — E669 Obesity, unspecified: Secondary | ICD-10-CM | POA: Insufficient documentation

## 2017-03-25 DIAGNOSIS — D509 Iron deficiency anemia, unspecified: Secondary | ICD-10-CM | POA: Diagnosis not present

## 2017-03-25 DIAGNOSIS — N189 Chronic kidney disease, unspecified: Secondary | ICD-10-CM

## 2017-03-25 DIAGNOSIS — Z9889 Other specified postprocedural states: Secondary | ICD-10-CM | POA: Insufficient documentation

## 2017-03-25 DIAGNOSIS — I129 Hypertensive chronic kidney disease with stage 1 through stage 4 chronic kidney disease, or unspecified chronic kidney disease: Secondary | ICD-10-CM | POA: Insufficient documentation

## 2017-03-25 DIAGNOSIS — Z841 Family history of disorders of kidney and ureter: Secondary | ICD-10-CM | POA: Insufficient documentation

## 2017-03-25 DIAGNOSIS — J42 Unspecified chronic bronchitis: Secondary | ICD-10-CM | POA: Insufficient documentation

## 2017-03-25 DIAGNOSIS — D508 Other iron deficiency anemias: Secondary | ICD-10-CM

## 2017-03-25 DIAGNOSIS — F329 Major depressive disorder, single episode, unspecified: Secondary | ICD-10-CM | POA: Diagnosis not present

## 2017-03-25 DIAGNOSIS — E1122 Type 2 diabetes mellitus with diabetic chronic kidney disease: Secondary | ICD-10-CM | POA: Insufficient documentation

## 2017-03-25 DIAGNOSIS — Z9119 Patient's noncompliance with other medical treatment and regimen: Secondary | ICD-10-CM | POA: Diagnosis not present

## 2017-03-25 DIAGNOSIS — Z9049 Acquired absence of other specified parts of digestive tract: Secondary | ICD-10-CM | POA: Diagnosis not present

## 2017-03-25 DIAGNOSIS — Z87891 Personal history of nicotine dependence: Secondary | ICD-10-CM | POA: Insufficient documentation

## 2017-03-25 DIAGNOSIS — K5730 Diverticulosis of large intestine without perforation or abscess without bleeding: Secondary | ICD-10-CM | POA: Diagnosis not present

## 2017-03-25 DIAGNOSIS — K219 Gastro-esophageal reflux disease without esophagitis: Secondary | ICD-10-CM | POA: Insufficient documentation

## 2017-03-25 DIAGNOSIS — K644 Residual hemorrhoidal skin tags: Secondary | ICD-10-CM | POA: Insufficient documentation

## 2017-03-25 DIAGNOSIS — G5600 Carpal tunnel syndrome, unspecified upper limb: Secondary | ICD-10-CM | POA: Diagnosis not present

## 2017-03-25 DIAGNOSIS — E785 Hyperlipidemia, unspecified: Secondary | ICD-10-CM | POA: Insufficient documentation

## 2017-03-25 DIAGNOSIS — G7000 Myasthenia gravis without (acute) exacerbation: Secondary | ICD-10-CM | POA: Insufficient documentation

## 2017-03-25 DIAGNOSIS — D631 Anemia in chronic kidney disease: Secondary | ICD-10-CM | POA: Insufficient documentation

## 2017-03-25 DIAGNOSIS — N183 Chronic kidney disease, stage 3 (moderate): Secondary | ICD-10-CM | POA: Diagnosis not present

## 2017-03-25 DIAGNOSIS — E079 Disorder of thyroid, unspecified: Secondary | ICD-10-CM | POA: Diagnosis not present

## 2017-03-25 DIAGNOSIS — Z800 Family history of malignant neoplasm of digestive organs: Secondary | ICD-10-CM | POA: Insufficient documentation

## 2017-03-25 LAB — BASIC METABOLIC PANEL
Anion gap: 7 (ref 5–15)
BUN: 20 mg/dL (ref 6–20)
CALCIUM: 9.2 mg/dL (ref 8.9–10.3)
CHLORIDE: 103 mmol/L (ref 101–111)
CO2: 29 mmol/L (ref 22–32)
CREATININE: 0.87 mg/dL (ref 0.44–1.00)
Glucose, Bld: 125 mg/dL — ABNORMAL HIGH (ref 65–99)
Potassium: 3.4 mmol/L — ABNORMAL LOW (ref 3.5–5.1)
SODIUM: 139 mmol/L (ref 135–145)

## 2017-03-25 LAB — RETICULOCYTES
RBC.: 3.84 MIL/uL — ABNORMAL LOW (ref 3.87–5.11)
RETIC COUNT ABSOLUTE: 57.6 10*3/uL (ref 19.0–186.0)
Retic Ct Pct: 1.5 % (ref 0.4–3.1)

## 2017-03-25 LAB — CBC WITH DIFFERENTIAL/PLATELET
BASOS ABS: 0 10*3/uL (ref 0.0–0.1)
Basophils Relative: 0 %
Eosinophils Absolute: 0.3 10*3/uL (ref 0.0–0.7)
Eosinophils Relative: 4 %
HCT: 32.6 % — ABNORMAL LOW (ref 36.0–46.0)
HEMOGLOBIN: 10.3 g/dL — AB (ref 12.0–15.0)
LYMPHS ABS: 1.6 10*3/uL (ref 0.7–4.0)
Lymphocytes Relative: 22 %
MCH: 26.8 pg (ref 26.0–34.0)
MCHC: 31.6 g/dL (ref 30.0–36.0)
MCV: 84.9 fL (ref 78.0–100.0)
Monocytes Absolute: 0.7 10*3/uL (ref 0.1–1.0)
Monocytes Relative: 10 %
NEUTROS ABS: 4.5 10*3/uL (ref 1.7–7.7)
NEUTROS PCT: 64 %
PLATELETS: 261 10*3/uL (ref 150–400)
RBC: 3.84 MIL/uL — AB (ref 3.87–5.11)
RDW: 18.6 % — ABNORMAL HIGH (ref 11.5–15.5)
WBC: 7.1 10*3/uL (ref 4.0–10.5)

## 2017-03-25 LAB — LACTATE DEHYDROGENASE: LDH: 195 U/L — AB (ref 98–192)

## 2017-03-25 LAB — SEDIMENTATION RATE: Sed Rate: 44 mm/hr — ABNORMAL HIGH (ref 0–22)

## 2017-03-25 NOTE — Progress Notes (Signed)
[name], MD [address] [place] [address]  Other iron deficiency anemia - Plan: CBC with Differential, Basic metabolic panel, Lactate dehydrogenase, Sedimentation rate, Pathologist smear review, C-reactive protein, Reticulocytes, Kappa/lambda light chains, IgG, IgA, IgM, Immunofixation electrophoresis, Protein electrophoresis, serum, Vitamin B12, Folate, Iron and TIBC, Ferritin, Haptoglobin, Occult blood card to lab, stool, Occult blood card to lab, stool, Occult blood card to lab, stool, CBC with Differential, Basic metabolic panel, Lactate dehydrogenase, Sedimentation rate, Pathologist smear review, C-reactive protein, Reticulocytes, Kappa/lambda light chains, IgG, IgA, IgM, Immunofixation electrophoresis, Protein electrophoresis, serum, Vitamin B12, Folate, Iron and TIBC, Ferritin, Haptoglobin  CURRENT THERAPY: Work-up for anemia  INTERVAL HISTORY: [name] 76 y.o. female returns for followup of severe iron deficiency anemia requiring IV iron therapy without significant improvement in anemia.  EGD on 09/16/2014 demonstrated a Schatzki's ring and hiatal hernia and colonoscopy on 05/08/2012 with internal and external hemorrhoids, colonic diverticulosis. AND Intermittent renal insufficiency. AND Noncompliance having missed follow-up appointment.  She is doing well today.  She denies any blood in her stools or black stools.  She notes fatigue and tiredness which is chronic and ongoing.  Her appetite is fair and her weight is stable.  Additional blood work will be drawn today given that her anemia persists and she has been noncompliant with recent appointments.  Other than fatigue and tiredness, she denies any complaints.  Hypertension is noted.  Review of Systems  Constitutional: Positive for malaise/fatigue. Negative for chills, fever and weight loss.  HENT: Negative.   Eyes: Negative.   Respiratory: Negative.  Negative for cough.     Cardiovascular: Negative.  Negative for chest pain.  Gastrointestinal: Negative.  Negative for blood in stool, constipation, diarrhea, melena, nausea and vomiting.  Genitourinary: Negative.   Musculoskeletal: Negative.   Skin: Negative.   Neurological: Negative.  Negative for weakness.  Endo/Heme/Allergies: Negative.   Psychiatric/Behavioral: Negative.     Past Medical History:  Diagnosis Date  . Anemia in chronic renal disease 12/30/2015  . Anxiety   . Arthritis    "all over" (01/19/2014)  . Carpal tunnel syndrome   . Chronic bronchitis ([place])    "got it q yr for awhile; hasn't had it in awhile" (01/19/2014)  . Chronic kidney disease (CKD) stage G3b/A1, moderately decreased glomerular filtration rate (GFR) between 30-44 mL/min/1.73 square meter and albuminuria creatinine ratio less than 30 mg/g 09/14/2009   Qualifier: Diagnosis of  By: Moshe Cipro MD, Joycelyn Schmid    . Chronic renal disease, stage 3, moderately decreased glomerular filtration rate (GFR) between 30-59 mL/min/1.73 square meter 09/14/2009   Qualifier: Diagnosis of  By: Moshe Cipro MD, Joycelyn Schmid    . Depression   . Diverticulosis 07/2004   Colonscopy Dr Gala Romney  . Esophagitis, erosive 2009  . Exertional shortness of breath   . Gastroesophageal reflux   . WKGSUPJS(315.9)    "usually a couple times/wk" (01/19/2014)  . Heart murmur    saw cardiology In [place], he told her she did not need to come back.  . Hyperlipidemia   . Hypertension   . Impingement syndrome, shoulder   . Iron deficiency anemia   . Mitral regurgitation   . Myasthenia gravis   . Myasthenia gravis in remission ([place]) 11/24/2014  . Obesity   . OSA on CPAP   . Pneumonia 01/2012  . Pulmonary HTN   . Schatzki's ring    Last EGD w/ dilation 02/08/11, 2009 & 2007  . Seasonal allergies   .  Shoulder pain   . Thyroid disease    "used to take RX; they took me off it" (01/19/2014)  . Tobacco abuse   . Type II diabetes mellitus ([place])     Past Surgical History:   Procedure Laterality Date  . [place] VITRECTOMY WITH 20 GAUGE MVR PORT Left 01/19/2014   Procedure: 25 GAUGE PARS PLANA VITRECTOMY WITH 20 GAUGE MVR PORT; MEMBRAME PEEL; SERUM PATCH; LASER TREATMENT; C3F8;  Surgeon: Hayden Pedro, MD;  Location: [facility];  Service: Ophthalmology;  Laterality: Left;  . ABDOMINAL HYSTERECTOMY    . APPENDECTOMY    . CARPAL TUNNEL RELEASE Bilateral   . CATARACT EXTRACTION W/PHACO Left 10/21/2013   Procedure: LEFT CATARACT EXTRACTION PHACO AND INTRAOCULAR LENS PLACEMENT (IOC);  Surgeon: Marylynn Pearson, MD;  Location: [facility];  Service: Ophthalmology;  Laterality: Left;  . CHOLECYSTECTOMY    . COLONOSCOPY  05/08/2012   HQI:ONGEXBMW and external hemorrhoids; colonic diverticulosis  . ESOPHAGEAL DILATION     "more than 3 times" (01/19/2014)  . ESOPHAGOGASTRODUODENOSCOPY  02/08/11   [place]ourk-Distal esophageal erosion consistent with mild erosive reflux   esophagitis/ Noncritical Schatzki ring, small hiatal hernia otherwise upper/ gastrointestinal tract appeared unremarkable, status post passage  of a Maloney dilation to biopsy disruption of the ring described  . ESOPHAGOGASTRODUODENOSCOPY  04/2012   2 tandem incomplete distal esophagea rings s/p dilation.   . ESOPHAGOGASTRODUODENOSCOPY N/A 09/16/2014   Procedure: ESOPHAGOGASTRODUODENOSCOPY (EGD);  Surgeon: Daneil Dolin, MD;  [facility];  Service: Endoscopy;  Laterality: N/A;  1200  . EYE SURGERY    . INCONTINENCE SURGERY  08/26/09   [place]  . JOINT REPLACEMENT     right total knee  . MALONEY DILATION N/A 09/16/2014   Procedure: Venia Minks DILATION;  Surgeon: Daneil Dolin, MD;  [facility];  Service: Endoscopy;  Laterality: N/A;  . PARS PLANA VITRECTOMY W/ REPAIR OF MACULAR HOLE Left 01/19/2014  . SAVORY DILATION N/A 09/16/2014   Procedure: SAVORY DILATION;  Surgeon: Daneil Dolin, MD;  [facility];  Service: Endoscopy;  Laterality: N/A;  . TONSILLECTOMY    . TOTAL KNEE  ARTHROPLASTY Right 05/13/07   Dr. Aline Brochure    Family History  Problem Relation Age of Onset  . Cancer Mother   . Heart disease Mother   . Cancer Father   . Heart disease Father   . Diabetes Sister   . Cancer Sister   . Kidney failure Sister   . Diabetes Son   . Hypertension Son   . Hypertension Daughter   . Colon cancer Neg Hx     Social History   Social History  . Marital status: Single    Spouse name: N/A  . Number of children: 2  . Years of education: N/A   Occupational History  . disabled Retired   Social History Main Topics  . Smoking status: Former Smoker    Packs/day: 0.50    Years: 25.00    Types: Cigarettes    Quit date: 01/01/2004  . Smokeless tobacco: Never Used  . Alcohol use No  . Drug use: No  . Sexual activity: No   Other Topics Concern  . None   Social History Narrative   Patient lives at home by herself   Patient drinks one cup of coffee a day   Patient is right handed.     PHYSICAL EXAMINATION  ECOG PERFORMANCE STATUS: 1 - Symptomatic but completely ambulatory  Vitals:  03/25/17 1440  BP: (!) 197/63  Pulse: (!) 59  Resp: 16  Temp: 98.1 F (36.7 C)    GENERAL:alert, no distress, well nourished, well developed, comfortable, cooperative, obese, smiling and unaccompanied SKIN: skin color, texture, turgor are normal, no rashes or significant lesions HEAD: Normocephalic, No masses, lesions, tenderness or abnormalities EYES: normal, EOMI, Conjunctiva are pink and non-injected EARS: External ears normal OROPHARYNX:lips, buccal mucosa, and tongue normal and mucous membranes are moist  NECK: supple, no adenopathy, trachea midline LYMPH:  no palpable lymphadenopathy BREAST:not examined LUNGS: clear to auscultation  HEART: regular rate & rhythm, no murmurs and no gallops ABDOMEN:abdomen soft and normal bowel sounds BACK: Back symmetric, no curvature. EXTREMITIES:less then 2 second capillary refill, no joint deformities, effusion, or  inflammation, no skin discoloration, no cyanosis  NEURO: alert & oriented x 3 with fluent speech, no focal motor/sensory deficits, gait normal   LABORATORY DATA: CBC    Component Value Date/Time   WBC 7.1 03/25/2017 1507   RBC 3.84 (L) 03/25/2017 1507   RBC 3.84 (L) 03/25/2017 1507   HGB 10.3 (L) 03/25/2017 1507   HCT 32.6 (L) 03/25/2017 1507   PLT 261 03/25/2017 1507   MCV 84.9 03/25/2017 1507   MCH 26.8 03/25/2017 1507   MCHC 31.6 03/25/2017 1507   RDW 18.6 (H) 03/25/2017 1507   LYMPHSABS 1.6 03/25/2017 1507   MONOABS 0.7 03/25/2017 1507   EOSABS 0.3 03/25/2017 1507   BASOSABS 0.0 03/25/2017 1507      Chemistry      Component Value Date/Time   NA 139 03/25/2017 1507   K 3.4 (L) 03/25/2017 1507   CL 103 03/25/2017 1507   CO2 29 03/25/2017 1507   BUN 20 03/25/2017 1507   CREATININE 0.87 03/25/2017 1507   CREATININE 1.37 (H) 01/14/2017 1431      Component Value Date/Time   CALCIUM 9.2 03/25/2017 1507   ALKPHOS 110 01/14/2017 1431   AST 16 01/14/2017 1431   ALT 10 01/14/2017 1431   BILITOT 0.4 01/14/2017 1431     Lab Results  Component Value Date   IRON 72 03/13/2016   TIBC 270 03/13/2016   FERRITIN 128 03/13/2016     PENDING LABS:   RADIOGRAPHIC STUDIES:  No results found.   PATHOLOGY:    ASSESSMENT AND PLAN:  Iron deficiency anemia severe iron deficiency anemia requiring IV iron therapy without significant improvement in anemia.  EGD on 09/16/2014 demonstrated a Schatzki's ring and hiatal hernia and colonoscopy on 05/08/2012 with internal and external hemorrhoids, colonic diverticulosis. AND Intermittent renal insufficiency. AND Noncompliance having missed follow-up appointment.  Labs today: CBC diff, BMET, iron/TIBC, ferritin, B12, folate, haptoglobin, retic count, pathology smear review, LDH, ESR, CRP, SPEP+IFE, light chain assay, stool cards x 3.  I personally reviewed and went over laboratory results with the patient.  The results are noted  within this dictation.  If iron replacement therapy is needed, we will provide IV iron.  Once replaced, if anemia persists, patient will need bone marrow aspiration and biopsy.  Hypertension is noted today.  Return in 6 weeks for follow-up.  If RBC/HGB is not significantly improved, then we will proceed with ordering and scheduling of a bone marrow aspiration and biopsy. This can be reviewed in more detail in 6 weeks if indicated.  She does have an aortic stenosis murmur.  I wonder if she has some mechanical, extracorpuscular hemolysis as a result.   ORDERS PLACED FOR THIS ENCOUNTER: Orders Placed This Encounter  Procedures  .  CBC with Differential  . Basic metabolic panel  . Lactate dehydrogenase  . Sedimentation rate  . Pathologist smear review  . C-reactive protein  . Reticulocytes  . Kappa/lambda light chains  . IgG, IgA, IgM  . Immunofixation electrophoresis  . Protein electrophoresis, serum  . Vitamin B12  . Folate  . Iron and TIBC  . Ferritin  . Haptoglobin  . Occult blood card to lab, stool  . Occult blood card to lab, stool  . Occult blood card to lab, stool    MEDICATIONS PRESCRIBED THIS ENCOUNTER: No orders of the defined types were placed in this encounter.   THERAPY PLAN:  Work-up for anemia.    All questions were answered. The patient knows to call the clinic with any problems, questions or concerns. We can certainly see the patient much sooner if necessary.  Patient and plan discussed with Dr. Twana First and she is in agreement with the aforementioned.   This note is electronically signed by: Doy Mince 03/25/2017 4:39 PM

## 2017-03-25 NOTE — Assessment & Plan Note (Addendum)
severe iron deficiency anemia requiring IV iron therapy without significant improvement in anemia.  EGD on 09/16/2014 demonstrated a Schatzki's ring and hiatal hernia and colonoscopy on 05/08/2012 with internal and external hemorrhoids, colonic diverticulosis. AND Intermittent renal insufficiency. AND Noncompliance having missed follow-up appointment.  Labs today: CBC diff, BMET, iron/TIBC, ferritin, B12, folate, haptoglobin, retic count, pathology smear review, LDH, ESR, CRP, SPEP+IFE, light chain assay, stool cards x 3.  I personally reviewed and went over laboratory results with the patient.  The results are noted within this dictation.  If iron replacement therapy is needed, we will provide IV iron.  Once replaced, if anemia persists, patient will need bone marrow aspiration and biopsy.  Hypertension is noted today.  Return in 6 weeks for follow-up.  If RBC/HGB is not significantly improved, then we will proceed with ordering and scheduling of a bone marrow aspiration and biopsy. This can be reviewed in more detail in 6 weeks if indicated.  She does have an aortic stenosis murmur.  I wonder if she has some mechanical, extracorpuscular hemolysis as a result.

## 2017-03-25 NOTE — Patient Instructions (Addendum)
[place] at [facility] Discharge Instructions  RECOMMENDATIONS MADE BY THE CONSULTANT AND ANY TEST RESULTS WILL BE SENT TO YOUR REFERRING PHYSICIAN.  You were seen today by Kirby Crigler PA-C You will have lab work today, we will call you with results We will also give you 3 stool cards, please collect samples from 3 different stools Return the cards to the cancer center when you complete all 3 Follow up in 6 weeks   Thank you for choosing [place] at [facility] to provide your oncology and hematology care.  To afford each patient quality time with our provider, please arrive at least 15 minutes before your scheduled appointment time.    If you have a lab appointment with the [facility] please come in thru the  Main Entrance and check in at the main information desk  You need to re-schedule your appointment should you arrive 10 or more minutes late.  We strive to give you quality time with our providers, and arriving late affects you and other patients whose appointments are after yours.  Also, if you no show three or more times for appointments you may be dismissed from the clinic at the providers discretion.     Again, thank you for choosing [facility].  Our hope is that these requests will decrease the amount of time that you wait before being seen by our physicians.       _____________________________________________________________  Should you have questions after your visit to [facility], please contact our office at [phone] between the hours of 8:30 a.m. and 4:30 p.m.  Voicemails left after 4:30 p.m. will not be returned until the following business day.  For prescription refill requests, have your pharmacy contact our office.       Resources For Cancer Patients and their Caregivers ? American Cancer Society: Can assist with transportation, wigs, general needs, runs Look Good Feel Better.         463-774-6079 ? Cancer Care: Provides financial assistance, online support groups, medication/co-pay assistance.  1-800-813-HOPE 539-484-0141) ? [place] Assists [place] cancer patients and their families through emotional , educational and financial support.  531-696-7400 ? [place] DSS Where to apply for food stamps, Medicaid and utility assistance. 519-626-2238 ? [facility]: Transportation to medical appointments. 530-782-1728 ? Social Security Administration: May apply for disability if have a Stage IV cancer. 626-838-5806 808-718-4030 ? [employer], Disability and Transit Services: Assists with nutrition, care and transit needs. [place] Support Programs: @10RELATIVEDAYS @ > Cancer Support Group  2nd Tuesday of the month 1pm-2pm, Journey Room  > Creative Journey  3rd Tuesday of the month 1130am-1pm, Journey Room  > Look Good Feel Better  1st Wednesday of the month 10am-12 noon, Journey Room (Call [place] to register [phone])

## 2017-03-26 LAB — PROTEIN ELECTROPHORESIS, SERUM
A/G RATIO SPE: 0.9 (ref 0.7–1.7)
ALBUMIN ELP: 3.2 g/dL (ref 2.9–4.4)
ALPHA-1-GLOBULIN: 0.2 g/dL (ref 0.0–0.4)
ALPHA-2-GLOBULIN: 0.7 g/dL (ref 0.4–1.0)
BETA GLOBULIN: 1.2 g/dL (ref 0.7–1.3)
GAMMA GLOBULIN: 1.3 g/dL (ref 0.4–1.8)
Globulin, Total: 3.5 g/dL (ref 2.2–3.9)
Total Protein ELP: 6.7 g/dL (ref 6.0–8.5)

## 2017-03-26 LAB — KAPPA/LAMBDA LIGHT CHAINS
KAPPA, LAMDA LIGHT CHAIN RATIO: 1.28 (ref 0.26–1.65)
Kappa free light chain: 23.6 mg/L — ABNORMAL HIGH (ref 3.3–19.4)
LAMDA FREE LIGHT CHAINS: 18.4 mg/L (ref 5.7–26.3)

## 2017-03-26 LAB — IRON AND TIBC
IRON: 120 ug/dL (ref 28–170)
SATURATION RATIOS: 31 % (ref 10.4–31.8)
TIBC: 382 ug/dL (ref 250–450)
UIBC: 262 ug/dL

## 2017-03-26 LAB — FERRITIN: FERRITIN: 26 ng/mL (ref 11–307)

## 2017-03-26 LAB — FOLATE: Folate: 7.1 ng/mL (ref >5.9)

## 2017-03-26 LAB — IGG, IGA, IGM
IgA: 255 mg/dL (ref 64–422)
IgG (Immunoglobin G), Serum: 1179 mg/dL (ref 700–1600)
IgM, Serum: 125 mg/dL (ref 26–217)

## 2017-03-26 LAB — VITAMIN B12: Vitamin B-12: 573 pg/mL (ref 180–914)

## 2017-03-26 LAB — PATHOLOGIST SMEAR REVIEW

## 2017-03-26 LAB — C-REACTIVE PROTEIN

## 2017-03-26 LAB — HAPTOGLOBIN: HAPTOGLOBIN: 165 mg/dL (ref 34–200)

## 2017-03-27 LAB — IMMUNOFIXATION ELECTROPHORESIS
IGA: 255 mg/dL (ref 64–422)
IGG (IMMUNOGLOBIN G), SERUM: 1173 mg/dL (ref 700–1600)
IGM, SERUM: 124 mg/dL (ref 26–217)
Total Protein ELP: 7 g/dL (ref 6.0–8.5)

## 2017-04-02 NOTE — Procedures (Signed)
[place] A. Merlene Laughter, MD     [email or web address]             NOCTURNAL POLYSOMNOGRAPHY   LOCATION: [facility]  Patient Name: [name], [name] Date: 03/20/2017 Gender: Female D.O.B: 1941-07-30 Age (years): 80 Referring Provider: Phillips Odor MD, ABSM Height (inches): 66 Interpreting Physician: Phillips Odor MD, ABSM Weight (lbs): 253 RPSGT: Peak, Robert BMI: 41 MRN: [id number] Neck Size: 15.00 CLINICAL INFORMATION Sleep Study Type: NPSG  Indication for sleep study: N/A  Epworth Sleepiness Score: 3  SLEEP STUDY TECHNIQUE As per the AASM Manual for the Scoring of Sleep and Associated Events v2.3 (April 2016) with a hypopnea requiring 4% desaturations.  The channels recorded and monitored were frontal, central and occipital EEG, electrooculogram (EOG), submentalis EMG (chin), nasal and oral airflow, thoracic and abdominal wall motion, anterior tibialis EMG, snore microphone, electrocardiogram, and pulse oximetry.  MEDICATIONS Medications self-administered by patient taken the night of the study : N/A  Current Outpatient Prescriptions:  .  acetaminophen (TYLENOL) 500 MG tablet, One tablet at bedtime, Disp: 30 tablet, Rfl: 5 .  amLODipine (NORVASC) 10 MG tablet, TAKE 1 TABLET BY MOUTH ONCE A DAY., Disp: 30 tablet, Rfl: 0 .  B-D ULTRAFINE III SHORT PEN 31G X 8 MM MISC, USE ONCE DAILY WITH LANTUS SOLOSTAR PEN., Disp: 100 each, Rfl: 5 .  brimonidine-timolol (COMBIGAN) 0.2-0.5 % ophthalmic solution, Place 1 drop into both eyes every 12 (twelve) hours., Disp: , Rfl:  .  cetirizine (ZYRTEC) 10 MG tablet, Take 1 tablet (10 mg total) by mouth daily. (Patient taking differently: Take 10 mg by mouth as needed. ), Disp: 90 tablet, Rfl: 1 .  citalopram (CELEXA) 40 MG tablet, TAKE ONE TABLET BY MOUTH DAILY., Disp: 30 tablet, Rfl: 0 .  clonazePAM (KLONOPIN) 0.5 MG tablet, TAKE ONE TABLET BY MOUTH 3 TIMES DAILY AS NEEDED FOR ANXIETY., Disp: 90 tablet, Rfl: 2 .   Cranberry 1000 MG CAPS, Take 1,000 mg by mouth daily. , Disp: , Rfl:  .  cycloSPORINE (RESTASIS) 0.05 % ophthalmic emulsion, Place 1 drop into both eyes 2 (two) times daily. , Disp: , Rfl:  .  diltiazem (CARDIZEM CD) 240 MG 24 hr capsule, TAKE (1) CAPSULE BY MOUTH TWICE DAILY., Disp: 60 capsule, Rfl: 3 .  Flaxseed, Linseed, (FLAXSEED OIL) 1000 MG CAPS, Take 1,000 mg by mouth daily., Disp: , Rfl:  .  fluticasone (FLONASE) 50 MCG/ACT nasal spray, Place 2 sprays into both nostrils daily. (Patient taking differently: Place 2 sprays into both nostrils as needed. ), Disp: 48 g, Rfl: 1 .  furosemide (LASIX) 40 MG tablet, Take 1 tablet (40 mg total) by mouth 2 (two) times daily., Disp: 40 tablet, Rfl: 0 .  gabapentin (NEURONTIN) 300 MG capsule, TAKE 1 CAPSULE BY MOUTH THREE TIMES A DAY., Disp: 90 capsule, Rfl: 0 .  hydrALAZINE (APRESOLINE) 25 MG tablet, TAKE 1 TABLET BY MOUTH THREE TIMES A DAY., Disp: 90 tablet, Rfl: 3 .  hydrALAZINE (APRESOLINE) 25 MG tablet, TAKE 1 TABLET BY MOUTH THREE TIMES A DAY., Disp: 90 tablet, Rfl: 5 .  HYDROcodone-acetaminophen (NORCO) 10-325 MG tablet, One tablet twice daily, Disp: 60 tablet, Rfl: 0 .  IRON PO, Take 1 tablet by mouth 2 (two) times daily., Disp: , Rfl:  .  LANTUS SOLOSTAR 100 UNIT/ML Solostar Pen, INJECT 30 UNITS SUBCUTANEOUSLY ONCE DAILY., Disp: 15 mL, Rfl: 3 .  losartan (COZAAR) 100 MG tablet, TAKE (1) TABLET BY MOUTH DAILY., Disp: 90 tablet, Rfl: 1 .  Multiple Vitamin (  MULTIVITAMIN WITH MINERALS) TABS tablet, Take 1 tablet by mouth at bedtime., Disp: , Rfl:  .  ondansetron (ZOFRAN) 4 MG tablet, TAKE (1) TABLET BY MOUTH EVERY FOUR HOURS AS NEEDED FOR NAUSEA., Disp: 30 tablet, Rfl: 0 .  ONETOUCH DELICA LANCETS 13K MISC, USE AS DIRECTED 3 TIMES DAILY FOR BLOOD GLUCOSE TESTING., Disp: 300 each, Rfl: 1 .  ONETOUCH VERIO test strip, USE AS DIRECTED TO TEST BLOOD GLUCOSE 3 TIMES DAILY., Disp: 100 each, Rfl: 11 .  pantoprazole (PROTONIX) 40 MG tablet, Take 1 tablet (40  mg total) by mouth daily before breakfast. Stop omeprazole., Disp: 30 tablet, Rfl: 11 .  polyethylene glycol powder (GLYCOLAX/MIRALAX) powder, Take 17 g by mouth daily., Disp: 3350 g, Rfl: 1 .  prednisoLONE acetate (PRED FORTE) 1 % ophthalmic suspension, Place 1 drop into the left eye 4 (four) times daily., Disp: 5 mL, Rfl: 0 .  predniSONE (DELTASONE) 5 MG tablet, TAKE 1 TABLET BY MOUTH DAILY., Disp: 90 tablet, Rfl: 1 .  PROAIR HFA 108 (90 Base) MCG/ACT inhaler, INHALE 2 PUFFS BY MOUTH EVERY 6 HOURS AS NEEDED ., Disp: 8.5 g, Rfl: 2 .  pyridostigmine (MESTINON) 60 MG tablet, TAKE (1) TABLET BY MOUTH THREE TIMES A DAY., Disp: 90 tablet, Rfl: 5 .  rosuvastatin (CRESTOR) 40 MG tablet, TAKE (1) TABLET BY MOUTH AT BEDTIME., Disp: 30 tablet, Rfl: 3 .  solifenacin (VESICARE) 5 MG tablet, Take 5 mg by mouth daily., Disp: , Rfl:  .  spironolactone (ALDACTONE) 25 MG tablet, TAKE 1 TABLET BY MOUTH ONCE A DAY. (Patient taking differently: TAKE 1 TABLET BY MOUTH ONCE A DAY. Takes 3 times per week), Disp: 30 tablet, Rfl: 3 .  temazepam (RESTORIL) 30 MG capsule, Take 30 mg by mouth at bedtime as needed for sleep., Disp: , Rfl:  .  TRADJENTA 5 MG TABS tablet, TAKE 1 TABLET BY MOUTH ONCE A DAY., Disp: 30 tablet, Rfl: 3 .  venlafaxine XR (EFFEXOR-XR) 150 MG 24 hr capsule, TAKE 1 CAPSULE BY MOUTH DAILY., Disp: 30 capsule, Rfl: 3 .  Vitamin D, Ergocalciferol, (DRISDOL) 50000 units CAPS capsule, TAKE 1 CAPSULE BY MOUTH ONCE A WEEK., Disp: 4 capsule, Rfl: 3   SLEEP ARCHITECTURE The study was initiated at 10:01:45 PM and ended at 4:58:18 AM.  Sleep onset time was 178.7 minutes and the sleep efficiency was 42.6%. The total sleep time was 177.4 minutes.  Stage REM latency was N/A minutes.  The patient spent 25.30% of the night in stage N1 sleep, 74.70% in stage N2 sleep, 0.00% in stage N3 and 0.00% in REM.  Alpha intrusion was absent.  Supine sleep was 73.01%.  RESPIRATORY PARAMETERS The overall apnea/hypopnea  index (AHI) was 8.5 per hour. There were 0 total apneas, including 0 obstructive, 0 central and 0 mixed apneas. There were 25 hypopneas and 13 RERAs.  The AHI during Stage REM sleep was N/A per hour.  AHI while supine was 10.2 per hour.  The mean oxygen saturation was 92.20%. The minimum SpO2 during sleep was 88.00%.  Moderate snoring was noted during this study.  CARDIAC DATA The 2 lead EKG demonstrated sinus rhythm. The mean heart rate was 55.23 beats per minute. Other EKG findings include: None. LEG MOVEMENT DATA The total PLMS were 0 with a resulting PLMS index of 0.00. Associated arousal with leg movement index was 0.0.  IMPRESSIONS - Mild obstructive sleep apnea that does not require positive pressure treatment. - No significant central sleep apnea occurred during this study. -  Clinically significant periodic limb movements did not occur during sleep. No significant associated arousals.   Delano Metz, MD Diplomate, American Board of Sleep Medicine.  ELECTRONICALLY SIGNED ON:  04/02/2017, 10:52 AM [facility] PH: [phone]   FX: [phone] [place]

## 2017-04-08 ENCOUNTER — Other Ambulatory Visit (HOSPITAL_COMMUNITY): Payer: Self-pay | Admitting: Emergency Medicine

## 2017-04-08 DIAGNOSIS — K644 Residual hemorrhoidal skin tags: Secondary | ICD-10-CM | POA: Insufficient documentation

## 2017-04-08 DIAGNOSIS — G7000 Myasthenia gravis without (acute) exacerbation: Secondary | ICD-10-CM | POA: Diagnosis not present

## 2017-04-08 DIAGNOSIS — N289 Disorder of kidney and ureter, unspecified: Secondary | ICD-10-CM | POA: Insufficient documentation

## 2017-04-08 DIAGNOSIS — M130 Polyarthritis, unspecified: Secondary | ICD-10-CM | POA: Diagnosis not present

## 2017-04-08 DIAGNOSIS — G4733 Obstructive sleep apnea (adult) (pediatric): Secondary | ICD-10-CM | POA: Diagnosis not present

## 2017-04-08 DIAGNOSIS — G2581 Restless legs syndrome: Secondary | ICD-10-CM | POA: Diagnosis not present

## 2017-04-08 DIAGNOSIS — K449 Diaphragmatic hernia without obstruction or gangrene: Secondary | ICD-10-CM | POA: Insufficient documentation

## 2017-04-08 DIAGNOSIS — K5790 Diverticulosis of intestine, part unspecified, without perforation or abscess without bleeding: Secondary | ICD-10-CM | POA: Insufficient documentation

## 2017-04-08 DIAGNOSIS — K222 Esophageal obstruction: Secondary | ICD-10-CM | POA: Insufficient documentation

## 2017-04-08 DIAGNOSIS — D508 Other iron deficiency anemias: Secondary | ICD-10-CM | POA: Insufficient documentation

## 2017-04-08 DIAGNOSIS — K648 Other hemorrhoids: Secondary | ICD-10-CM | POA: Insufficient documentation

## 2017-04-08 DIAGNOSIS — Z9119 Patient's noncompliance with other medical treatment and regimen: Secondary | ICD-10-CM | POA: Insufficient documentation

## 2017-04-08 DIAGNOSIS — M545 Low back pain: Secondary | ICD-10-CM | POA: Diagnosis not present

## 2017-04-08 LAB — OCCULT BLOOD X 1 CARD TO LAB, STOOL
FECAL OCCULT BLD: NEGATIVE
Fecal Occult Bld: NEGATIVE
Fecal Occult Bld: NEGATIVE

## 2017-05-06 ENCOUNTER — Ambulatory Visit (INDEPENDENT_AMBULATORY_CARE_PROVIDER_SITE_OTHER): Payer: Medicare Other

## 2017-05-06 ENCOUNTER — Other Ambulatory Visit: Payer: Self-pay | Admitting: Family Medicine

## 2017-05-06 ENCOUNTER — Ambulatory Visit (INDEPENDENT_AMBULATORY_CARE_PROVIDER_SITE_OTHER): Payer: Medicare Other | Admitting: Family Medicine

## 2017-05-06 ENCOUNTER — Other Ambulatory Visit (HOSPITAL_COMMUNITY): Payer: Self-pay | Admitting: *Deleted

## 2017-05-06 ENCOUNTER — Encounter: Payer: Self-pay | Admitting: Family Medicine

## 2017-05-06 ENCOUNTER — Other Ambulatory Visit (HOSPITAL_COMMUNITY): Payer: Self-pay | Admitting: Oncology

## 2017-05-06 VITALS — BP 138/70 | HR 62 | Temp 99.0°F | Ht 65.0 in | Wt 250.1 lb

## 2017-05-06 VITALS — BP 138/70 | HR 62 | Resp 16 | Ht 65.0 in | Wt 250.0 lb

## 2017-05-06 DIAGNOSIS — IMO0001 Reserved for inherently not codable concepts without codable children: Secondary | ICD-10-CM

## 2017-05-06 DIAGNOSIS — F418 Other specified anxiety disorders: Secondary | ICD-10-CM

## 2017-05-06 DIAGNOSIS — G8929 Other chronic pain: Secondary | ICD-10-CM | POA: Diagnosis not present

## 2017-05-06 DIAGNOSIS — D649 Anemia, unspecified: Secondary | ICD-10-CM

## 2017-05-06 DIAGNOSIS — Z794 Long term (current) use of insulin: Secondary | ICD-10-CM

## 2017-05-06 DIAGNOSIS — E782 Mixed hyperlipidemia: Secondary | ICD-10-CM | POA: Diagnosis not present

## 2017-05-06 DIAGNOSIS — E119 Type 2 diabetes mellitus without complications: Secondary | ICD-10-CM | POA: Diagnosis not present

## 2017-05-06 DIAGNOSIS — D508 Other iron deficiency anemias: Secondary | ICD-10-CM | POA: Diagnosis not present

## 2017-05-06 DIAGNOSIS — E784 Other hyperlipidemia: Secondary | ICD-10-CM | POA: Diagnosis not present

## 2017-05-06 DIAGNOSIS — Z0000 Encounter for general adult medical examination without abnormal findings: Secondary | ICD-10-CM | POA: Diagnosis not present

## 2017-05-06 DIAGNOSIS — I10 Essential (primary) hypertension: Secondary | ICD-10-CM

## 2017-05-06 DIAGNOSIS — E7849 Other hyperlipidemia: Secondary | ICD-10-CM

## 2017-05-06 MED ORDER — GABAPENTIN 300 MG PO CAPS: 300.0000 mg | ORAL_CAPSULE | Freq: Two times a day (BID) | ORAL | 5 refills | Status: DC

## 2017-05-06 MED ORDER — HYDROCODONE-ACETAMINOPHEN 10-325 MG PO TABS: ORAL_TABLET | ORAL | 0 refills | Status: DC

## 2017-05-06 MED ORDER — SOLIFENACIN SUCCINATE 10 MG PO TABS: 10.0000 mg | ORAL_TABLET | Freq: Every day | ORAL | 5 refills | Status: DC

## 2017-05-06 NOTE — Patient Instructions (Addendum)
[name] , Thank you for taking time to come for your Medicare Wellness Visit. I appreciate your ongoing commitment to your health goals. Please review the following plan we discussed and let me know if I can assist you in the future.   Screening recommendations/referrals: Community resource referral sent in today to assist you with dental care, assistance with food, and cleaning services. You should receive a call from our care guide within the next 2-4 weeks.  Colonoscopy: Up to date, next due 04/2022 Mammogram: Up to date, next due 08/2017 Bone Density: Up to date Diabetic Eye Exam: Up to date, next due 01/2018 Recommended yearly dental visit for hygiene and checkup  Vaccinations: Influenza vaccine: Up to date, next due 07/2017 Pneumococcal vaccine: Up to date Tdap vaccine: Due, declines Shingles vaccine: Done    Advanced directives: Advance directive discussed with you today. I have provided a copy for you to complete at home and have notarized. Once this is complete please bring a copy in to our office so we can scan it into your chart.  Conditions/risks identified: Obese, recommend starting a routine exercise program at least 3 days a week for 30-45 minutes at a time as tolerated.   Next appointment: Follow up in 1 year for your annual wellness visit.  Preventive Care 74 Years and Older, Female Preventive care refers to lifestyle choices and visits with your health care provider that can promote health and wellness. What does preventive care include?  A yearly physical exam. This is also called an annual well check.  Dental exams once or twice a year.  Routine eye exams. Ask your health care provider how often you should have your eyes checked.  Personal lifestyle choices, including:  Daily care of your teeth and gums.  Regular physical activity.  Eating a healthy diet.  Avoiding tobacco and drug use.  Limiting alcohol use.  Practicing safe sex.  Taking low-dose  aspirin every day.  Taking vitamin and mineral supplements as recommended by your health care provider. What happens during an annual well check? The services and screenings done by your health care provider during your annual well check will depend on your age, overall health, lifestyle risk factors, and family history of disease. Counseling  Your health care provider may ask you questions about your:  Alcohol use.  Tobacco use.  Drug use.  Emotional well-being.  Home and relationship well-being.  Sexual activity.  Eating habits.  History of falls.  Memory and ability to understand (cognition).  Work and work Statistician.  Reproductive health. Screening  You may have the following tests or measurements:  Height, weight, and BMI.  Blood pressure.  Lipid and cholesterol levels. These may be checked every 5 years, or more frequently if you are over 17 years old.  Skin check.  Lung cancer screening. You may have this screening every year starting at age 74 if you have a 30-pack-year history of smoking and currently smoke or have quit within the past 15 years.  Fecal occult blood test (FOBT) of the stool. You may have this test every year starting at age 74.  Flexible sigmoidoscopy or colonoscopy. You may have a sigmoidoscopy every 5 years or a colonoscopy every 10 years starting at age 74.  Hepatitis C blood test.  Hepatitis B blood test.  Sexually transmitted disease (STD) testing.  Diabetes screening. This is done by checking your blood sugar (glucose) after you have not eaten for a while (fasting). You may have this done  every 1-3 years.  Bone density scan. This is done to screen for osteoporosis. You may have this done starting at age 23.  Mammogram. This may be done every 1-2 years. Talk to your health care provider about how often you should have regular mammograms. Talk with your health care provider about your test results, treatment options, and if  necessary, the need for more tests. Vaccines  Your health care provider may recommend certain vaccines, such as:  Influenza vaccine. This is recommended every year.  Tetanus, diphtheria, and acellular pertussis (Tdap, Td) vaccine. You may need a Td booster every 10 years.  Zoster vaccine. You may need this after age 74.  Pneumococcal 13-valent conjugate (PCV13) vaccine. One dose is recommended after age 26.  Pneumococcal polysaccharide (PPSV23) vaccine. One dose is recommended after age 74. Talk to your health care provider about which screenings and vaccines you need and how often you need them. This information is not intended to replace advice given to you by your health care provider. Make sure you discuss any questions you have with your health care provider. Document Released: 01/13/2016 Document Revised: 09/05/2016 Document Reviewed: 10/18/2015 Elsevier Interactive Patient Education  2017 [place] Prevention in the Home Falls can cause injuries. They can happen to people of all ages. There are many things you can do to make your home safe and to help prevent falls. What can I do on the outside of my home?  Regularly fix the edges of walkways and driveways and fix any cracks.  Remove anything that might make you trip as you walk through a door, such as a raised step or threshold.  Trim any bushes or trees on the path to your home.  Use bright outdoor lighting.  Clear any walking paths of anything that might make someone trip, such as rocks or tools.  Regularly check to see if handrails are loose or broken. Make sure that both sides of any steps have handrails.  Any raised decks and porches should have guardrails on the edges.  Have any leaves, snow, or ice cleared regularly.  Use sand or salt on walking paths during winter.  Clean up any spills in your garage right away. This includes oil or grease spills. What can I do in the bathroom?  Use night  lights.  Install grab bars by the toilet and in the tub and shower. Do not use towel bars as grab bars.  Use non-skid mats or decals in the tub or shower.  If you need to sit down in the shower, use a plastic, non-slip stool.  Keep the floor dry. Clean up any water that spills on the floor as soon as it happens.  Remove soap buildup in the tub or shower regularly.  Attach bath mats securely with double-sided non-slip rug tape.  Do not have throw rugs and other things on the floor that can make you trip. What can I do in the bedroom?  Use night lights.  Make sure that you have a light by your bed that is easy to reach.  Do not use any sheets or blankets that are too big for your bed. They should not hang down onto the floor.  Have a firm chair that has side arms. You can use this for support while you get dressed.  Do not have throw rugs and other things on the floor that can make you trip. What can I do in the kitchen?  Clean up any spills right  away.  Avoid walking on wet floors.  Keep items that you use a lot in easy-to-reach places.  If you need to reach something above you, use a strong step stool that has a grab bar.  Keep electrical cords out of the way.  Do not use floor polish or wax that makes floors slippery. If you must use wax, use non-skid floor wax.  Do not have throw rugs and other things on the floor that can make you trip. What can I do with my stairs?  Do not leave any items on the stairs.  Make sure that there are handrails on both sides of the stairs and use them. Fix handrails that are broken or loose. Make sure that handrails are as long as the stairways.  Check any carpeting to make sure that it is firmly attached to the stairs. Fix any carpet that is loose or worn.  Avoid having throw rugs at the top or bottom of the stairs. If you do have throw rugs, attach them to the floor with carpet tape.  Make sure that you have a light switch at the  top of the stairs and the bottom of the stairs. If you do not have them, ask someone to add them for you. What else can I do to help prevent falls?  Wear shoes that:  Do not have high heels.  Have rubber bottoms.  Are comfortable and fit you well.  Are closed at the toe. Do not wear sandals.  If you use a stepladder:  Make sure that it is fully opened. Do not climb a closed stepladder.  Make sure that both sides of the stepladder are locked into place.  Ask someone to hold it for you, if possible.  Clearly mark and make sure that you can see:  Any grab bars or handrails.  First and last steps.  Where the edge of each step is.  Use tools that help you move around (mobility aids) if they are needed. These include:  Canes.  Walkers.  Scooters.  Crutches.  Turn on the lights when you go into a dark area. Replace any light bulbs as soon as they burn out.  Set up your furniture so you have a clear path. Avoid moving your furniture around.  If any of your floors are uneven, fix them.  If there are any pets around you, be aware of where they are.  Review your medicines with your doctor. Some medicines can make you feel dizzy. This can increase your chance of falling. Ask your doctor what other things that you can do to help prevent falls. This information is not intended to replace advice given to you by your health care provider. Make sure you discuss any questions you have with your health care provider. Document Released: 10/13/2009 Document Revised: 05/24/2016 Document Reviewed: 01/21/2015 Elsevier Interactive Patient Education  2017 [employer].  8

## 2017-05-06 NOTE — Progress Notes (Signed)
Subjective:   [name] is a 76 y.o. female who presents for Medicare Annual (Subsequent) preventive examination.  Review of Systems:  Cardiac Risk Factors include: advanced age (>38men, >89 women);diabetes mellitus;dyslipidemia;hypertension;obesity (BMI >30kg/m2);sedentary lifestyle     Objective:     Vitals: BP 138/70   Pulse 62   Temp 99 F (37.2 C) (Oral)   Ht 5\' 5"  (1.651 m)   Wt 250 lb 1.9 oz (113.5 kg)   SpO2 98%   BMI 41.62 kg/m   Body mass index is 41.62 kg/m.   Tobacco History  Smoking Status  . Former Smoker  . Packs/day: 0.50  . Years: 25.00  . Types: Cigarettes  . Quit date: 01/01/2004  Smokeless Tobacco  . Never Used     Counseling given: Not Answered   Past Medical History:  Diagnosis Date  . Anemia in chronic renal disease 12/30/2015  . Anxiety   . Arthritis    "all over" (01/19/2014)  . Carpal tunnel syndrome   . Chronic bronchitis ([place])    "got it q yr for awhile; hasn't had it in awhile" (01/19/2014)  . Chronic kidney disease (CKD) stage G3b/A1, moderately decreased glomerular filtration rate (GFR) between 30-44 mL/min/1.73 square meter and albuminuria creatinine ratio less than 30 mg/g 09/14/2009   Qualifier: Diagnosis of  By: Moshe Cipro MD, Joycelyn Schmid    . Chronic renal disease, stage 3, moderately decreased glomerular filtration rate (GFR) between 30-59 mL/min/1.73 square meter 09/14/2009   Qualifier: Diagnosis of  By: Moshe Cipro MD, Joycelyn Schmid    . Depression   . Diverticulosis 07/2004   Colonscopy Dr Gala Romney  . Esophagitis, erosive 2009  . Exertional shortness of breath   . Gastroesophageal reflux   . LSLHTDSK(876.8)    "usually a couple times/wk" (01/19/2014)  . Heart murmur    saw cardiology In [place], he told her she did not need to come back.  . Hyperlipidemia   . Hypertension   . Impingement syndrome, shoulder   . Iron deficiency anemia   . Mitral regurgitation   . Myasthenia gravis   . Myasthenia gravis in remission ([place])  11/24/2014  . Obesity   . OSA on CPAP   . Pneumonia 01/2012  . Pulmonary HTN ([place])   . Schatzki's ring    Last EGD w/ dilation 02/08/11, 2009 & 2007  . Seasonal allergies   . Shoulder pain   . Thyroid disease    "used to take RX; they took me off it" (01/19/2014)  . Tobacco abuse   . Type II diabetes mellitus ([place])    Past Surgical History:  Procedure Laterality Date  . Victoria Vera VITRECTOMY WITH 20 GAUGE MVR PORT Left 01/19/2014   Procedure: 25 GAUGE PARS PLANA VITRECTOMY WITH 20 GAUGE MVR PORT; MEMBRAME PEEL; SERUM PATCH; LASER TREATMENT; C3F8;  Surgeon: Hayden Pedro, MD;  Location: [facility];  Service: Ophthalmology;  Laterality: Left;  . ABDOMINAL HYSTERECTOMY    . APPENDECTOMY    . CARPAL TUNNEL RELEASE Bilateral   . CATARACT EXTRACTION W/PHACO Left 10/21/2013   Procedure: LEFT CATARACT EXTRACTION PHACO AND INTRAOCULAR LENS PLACEMENT (IOC);  Surgeon: Marylynn Pearson, MD;  Location: [facility];  Service: Ophthalmology;  Laterality: Left;  . CHOLECYSTECTOMY    . COLONOSCOPY  05/08/2012   TLX:BWIOMBTD and external hemorrhoids; colonic diverticulosis  . ESOPHAGEAL DILATION     "more than 3 times" (01/19/2014)  . ESOPHAGOGASTRODUODENOSCOPY  02/08/11   Rourk-Distal esophageal erosion consistent with mild erosive reflux   esophagitis/  Noncritical Schatzki ring, small hiatal hernia otherwise upper/ gastrointestinal tract appeared unremarkable, status post passage  of a Maloney dilation to biopsy disruption of the ring described  . ESOPHAGOGASTRODUODENOSCOPY  04/2012   2 tandem incomplete distal esophagea rings s/p dilation.   . ESOPHAGOGASTRODUODENOSCOPY N/A 09/16/2014   Procedure: ESOPHAGOGASTRODUODENOSCOPY (EGD);  Surgeon: Daneil Dolin, MD;  [facility];  Service: Endoscopy;  Laterality: N/A;  1200  . EYE SURGERY    . INCONTINENCE SURGERY  08/26/09   [place]  . JOINT REPLACEMENT     right total knee  . MALONEY DILATION N/A 09/16/2014   Procedure: Venia Minks DILATION;  Surgeon:  Daneil Dolin, MD;  [facility];  Service: Endoscopy;  Laterality: N/A;  . PARS PLANA VITRECTOMY W/ REPAIR OF MACULAR HOLE Left 01/19/2014  . SAVORY DILATION N/A 09/16/2014   Procedure: SAVORY DILATION;  Surgeon: Daneil Dolin, MD;  [facility];  Service: Endoscopy;  Laterality: N/A;  . TONSILLECTOMY    . TOTAL KNEE ARTHROPLASTY Right 05/13/07   Dr. Aline Brochure   Family History  Problem Relation Age of Onset  . Cancer Mother   . Heart disease Mother   . Cancer Father   . Heart disease Father   . Diabetes Sister   . Hypertension Brother   . Heart disease Brother   . Cancer Sister   . Kidney failure Sister   . Diabetes Son   . Hypertension Son   . Hypertension Daughter   . Colon cancer Neg Hx    History  Sexual Activity  . Sexual activity: No    Outpatient Encounter Prescriptions as of 05/06/2017  Medication Sig  . acetaminophen (TYLENOL) 500 MG tablet One tablet at bedtime  . amLODipine (NORVASC) 10 MG tablet TAKE 1 TABLET BY MOUTH ONCE A DAY.  Marland Kitchen B-D ULTRAFINE III SHORT PEN 31G X 8 MM MISC USE ONCE DAILY WITH LANTUS SOLOSTAR PEN.  . brimonidine-timolol (COMBIGAN) 0.2-0.5 % ophthalmic solution Place 1 drop into both eyes every 12 (twelve) hours.  . cetirizine (ZYRTEC) 10 MG tablet Take 1 tablet (10 mg total) by mouth daily. (Patient taking differently: Take 10 mg by mouth daily. )  . citalopram (CELEXA) 40 MG tablet TAKE ONE TABLET BY MOUTH DAILY.  . clonazePAM (KLONOPIN) 0.5 MG tablet TAKE ONE TABLET BY MOUTH 3 TIMES DAILY AS NEEDED FOR ANXIETY.  . Cranberry 1000 MG CAPS Take 1,000 mg by mouth daily.   . cycloSPORINE (RESTASIS) 0.05 % ophthalmic emulsion Place 1 drop into both eyes 2 (two) times daily.   Marland Kitchen diltiazem (CARDIZEM CD) 240 MG 24 hr capsule TAKE (1) CAPSULE BY MOUTH TWICE DAILY.  Marland Kitchen Flaxseed, Linseed, (FLAXSEED OIL) 1000 MG CAPS Take 1,000 mg by mouth daily.  . fluticasone (FLONASE) 50 MCG/ACT nasal spray Place 2 sprays into both nostrils daily.  (Patient taking differently: Place 2 sprays into both nostrils as needed. )  . gabapentin (NEURONTIN) 300 MG capsule TAKE 1 CAPSULE BY MOUTH THREE TIMES A DAY.  . hydrALAZINE (APRESOLINE) 25 MG tablet TAKE 1 TABLET BY MOUTH THREE TIMES A DAY.  Marland Kitchen HYDROcodone-acetaminophen (NORCO) 10-325 MG tablet One tablet twice daily  . IRON PO Take 1 tablet by mouth 2 (two) times daily.  Marland Kitchen LANTUS SOLOSTAR 100 UNIT/ML Solostar Pen INJECT 30 UNITS SUBCUTANEOUSLY ONCE DAILY. (Patient taking differently: INJECT 15 UNITS SUBCUTANEOUSLY ONCE DAILY.)  . losartan (COZAAR) 100 MG tablet TAKE (1) TABLET BY MOUTH DAILY.  . Multiple Vitamin (MULTIVITAMIN WITH MINERALS) TABS  tablet Take 1 tablet by mouth at bedtime.  . ondansetron (ZOFRAN) 4 MG tablet TAKE (1) TABLET BY MOUTH EVERY FOUR HOURS AS NEEDED FOR NAUSEA.  Glory Rosebush DELICA LANCETS 50K MISC USE AS DIRECTED 3 TIMES DAILY FOR BLOOD GLUCOSE TESTING.  Glory Rosebush VERIO test strip USE AS DIRECTED TO TEST BLOOD GLUCOSE 3 TIMES DAILY.  . pantoprazole (PROTONIX) 40 MG tablet Take 1 tablet (40 mg total) by mouth daily before breakfast. Stop omeprazole.  . polyethylene glycol powder (GLYCOLAX/MIRALAX) powder Take 17 g by mouth daily.  . prednisoLONE acetate (PRED FORTE) 1 % ophthalmic suspension Place 1 drop into the left eye 4 (four) times daily.  . predniSONE (DELTASONE) 5 MG tablet TAKE 1 TABLET BY MOUTH DAILY.  Marland Kitchen PROAIR HFA 108 (90 Base) MCG/ACT inhaler INHALE 2 PUFFS BY MOUTH EVERY 6 HOURS AS NEEDED .  Marland Kitchen pyridostigmine (MESTINON) 60 MG tablet TAKE (1) TABLET BY MOUTH THREE TIMES A DAY.  . rosuvastatin (CRESTOR) 40 MG tablet TAKE (1) TABLET BY MOUTH AT BEDTIME.  Marland Kitchen solifenacin (VESICARE) 5 MG tablet Take 5 mg by mouth daily.  Marland Kitchen spironolactone (ALDACTONE) 25 MG tablet TAKE 1 TABLET BY MOUTH ONCE A DAY.  Marland Kitchen temazepam (RESTORIL) 30 MG capsule Take 30 mg by mouth at bedtime as needed for sleep.  . TRADJENTA 5 MG TABS tablet TAKE 1 TABLET BY MOUTH ONCE A DAY.  Marland Kitchen venlafaxine XR  (EFFEXOR-XR) 150 MG 24 hr capsule TAKE 1 CAPSULE BY MOUTH DAILY.  Marland Kitchen Vitamin D, Ergocalciferol, (DRISDOL) 50000 units CAPS capsule TAKE 1 CAPSULE BY MOUTH ONCE A WEEK.  . furosemide (LASIX) 40 MG tablet Take 1 tablet (40 mg total) by mouth 2 (two) times daily. (Patient not taking: Reported on 05/06/2017)  . [DISCONTINUED] hydrALAZINE (APRESOLINE) 25 MG tablet TAKE 1 TABLET BY MOUTH THREE TIMES A DAY.   No facility-administered encounter medications on file as of 05/06/2017.     Activities of Daily Living In your present state of health, do you have any difficulty performing the following activities: 05/06/2017 08/15/2016  Hearing? Y N  Vision? Y Y  Difficulty concentrating or making decisions? Y N  Walking or climbing stairs? Y Y  Dressing or bathing? N N  Doing errands, shopping? N N  Preparing Food and eating ? N -  Using the Toilet? N -  In the past six months, have you accidently leaked urine? Y -  Do you have problems with loss of bowel control? N -  Managing your Medications? N -  Managing your Finances? N -  Housekeeping or managing your Housekeeping? Y -  Some recent data might be hidden    Patient Care Team: Fayrene Helper, MD as PCP - General Rourk, Cristopher Estimable, MD (Gastroenterology) Marylynn Pearson, MD as Consulting Physician (Ophthalmology) Phillips Odor, MD as Consulting Physician (Neurology) Hayden Pedro, MD as Consulting Physician (Ophthalmology) Whitney Muse, Kelby Fam, MD as Consulting Physician (Hematology and Oncology) Carole Civil, MD as Consulting Physician (Orthopedic Surgery)    Assessment:    Exercise Activities and Dietary recommendations Current Exercise Habits: Home exercise routine, Type of exercise: stretching, Time (Minutes): 10, Frequency (Times/Week): 7, Weekly Exercise (Minutes/Week): 70, Intensity: Mild  Goals    . Exercise 3x per week (30 min per time)          Recommend starting a routine exercise program at least 3 days a week for 30-45  minutes at a time as tolerated.        Fall Risk Fall  Risk  05/06/2017 01/14/2017 01/14/2017 10/20/2015 04/20/2014  Falls in the past year? Yes Yes Yes No Yes  Number falls in past yr: 1 2 or more 2 or more - 2 or more  Injury with Fall? No No - - -  Risk Factor Category  - High Fall Risk - - High Fall Risk  Risk for fall due to : - - - - Impaired vision  Follow up Education provided;Falls prevention discussed;Falls evaluation completed - - - -   Depression Screen PHQ 2/9 Scores 05/06/2017 01/14/2017 10/20/2015 04/20/2014  PHQ - 2 Score 2 3 0 6  PHQ- 9 Score 3 18 - 18     Cognitive Function: Normal     6CIT Screen 05/06/2017  What Year? 0 points  What month? 0 points  What time? 0 points  Count back from 20 0 points  Months in reverse 0 points  Repeat phrase 0 points  Total Score 0    Immunization History  Administered Date(s) Administered  . Influenza Split 09/30/2012  . Influenza Whole 09/24/2007, 11/02/2009, 09/18/2010, 09/18/2011  . Influenza,inj,Quad PF,36+ Mos 12/16/2013, 08/26/2014, 09/07/2015, 09/10/2016  . Pneumococcal Conjugate-13 01/26/2015  . Pneumococcal Polysaccharide-23 07/12/2004, 09/18/2011  . Td 07/12/2004  . Zoster 01/05/2007   Screening Tests Health Maintenance  Topic Date Due  . TETANUS/TDAP  02/14/2018 (Originally 07/12/2014)  . HEMOGLOBIN A1C  05/29/2017  . INFLUENZA VACCINE  07/31/2017  . FOOT EXAM  08/15/2017  . OPHTHALMOLOGY EXAM  02/18/2018  . MAMMOGRAM  09/10/2018  . COLONOSCOPY  05/08/2022  . DEXA SCAN  Completed  . PNA vac Low Risk Adult  Completed      Plan:   I have personally reviewed and noted the following in the patient's chart:   . Medical and social history . Use of alcohol, tobacco or illicit drugs  . Current medications and supplements . Functional ability and status . Nutritional status . Physical activity . Advanced directives . List of other physicians . Hospitalizations, surgeries, and ER visits in previous 12  months . Vitals . Screenings to include cognitive, depression, and falls . Referrals and appointments: Community resource referral sent in today to help assist patient with dental care, food, and cleaning services.  In addition, I have reviewed and discussed with patient certain preventive protocols, quality metrics, and best practice recommendations. A written personalized care plan for preventive services as well as general preventive health recommendations were provided to patient.     Stormy Fabian, LPN  03/07/9431

## 2017-05-06 NOTE — Patient Instructions (Addendum)
f/u in12 weeks, call if you need me sooner.  Increase in dose of vesicare otherwise no changes  We will conact you by mail withy lab results if unable to get you on the phone  Please work on good  health habits so that your health will improve. 1. Commitment to daily physical activity for 30 to 60  minutes, if you are able to do this.  2. Commitment to wise food choices. Aim for half of your  food intake to be vegetable and fruit, one quarter starchy foods, and one quarter protein. Try to eat on a regular schedule  3 meals per day, snacking between meals should be limited to vegetables or fruits or small portions of nuts. 64 ounces of water per day is generally recommended, unless you have specific health conditions, like heart failure or kidney failure where you will need to limit fluid intake.  3. Commitment to sufficient and a  good quality of physical and mental rest daily, generally between 6 to 8 hours per day.  WITH PERSISTANCE AND PERSEVERANCE, THE IMPOSSIBLE , BECOMES THE NORM! Thank you  for choosing [facility]. We consider it a privelige to serve you.  Delivering excellent health care in a caring and  compassionate way is our goal.  Partnering with you,  so that together we can achieve this goal is our strategy.     Thank you  for choosing [facility]. We consider it a privelige to serve you.  Delivering excellent health care in a caring and  compassionate way is our goal.  Partnering with you,  so that together we can achieve this goal is our strategy.   Happy Mother's daY!

## 2017-05-07 ENCOUNTER — Encounter (HOSPITAL_COMMUNITY): Payer: Medicare Other

## 2017-05-07 ENCOUNTER — Encounter (HOSPITAL_COMMUNITY): Payer: Self-pay | Admitting: Adult Health

## 2017-05-07 ENCOUNTER — Encounter (HOSPITAL_COMMUNITY): Payer: Medicare Other | Attending: Adult Health | Admitting: Adult Health

## 2017-05-07 VITALS — BP 190/61 | HR 57 | Temp 98.4°F | Resp 18 | Wt 251.2 lb

## 2017-05-07 DIAGNOSIS — D649 Anemia, unspecified: Secondary | ICD-10-CM | POA: Diagnosis not present

## 2017-05-07 DIAGNOSIS — E785 Hyperlipidemia, unspecified: Secondary | ICD-10-CM | POA: Diagnosis not present

## 2017-05-07 DIAGNOSIS — N183 Chronic kidney disease, stage 3 (moderate): Secondary | ICD-10-CM | POA: Diagnosis not present

## 2017-05-07 DIAGNOSIS — I129 Hypertensive chronic kidney disease with stage 1 through stage 4 chronic kidney disease, or unspecified chronic kidney disease: Secondary | ICD-10-CM | POA: Diagnosis not present

## 2017-05-07 DIAGNOSIS — F329 Major depressive disorder, single episode, unspecified: Secondary | ICD-10-CM | POA: Insufficient documentation

## 2017-05-07 DIAGNOSIS — Z79899 Other long term (current) drug therapy: Secondary | ICD-10-CM | POA: Insufficient documentation

## 2017-05-07 DIAGNOSIS — K219 Gastro-esophageal reflux disease without esophagitis: Secondary | ICD-10-CM | POA: Insufficient documentation

## 2017-05-07 DIAGNOSIS — Z794 Long term (current) use of insulin: Secondary | ICD-10-CM | POA: Diagnosis not present

## 2017-05-07 DIAGNOSIS — H9202 Otalgia, left ear: Secondary | ICD-10-CM

## 2017-05-07 DIAGNOSIS — E1122 Type 2 diabetes mellitus with diabetic chronic kidney disease: Secondary | ICD-10-CM | POA: Insufficient documentation

## 2017-05-07 DIAGNOSIS — K5900 Constipation, unspecified: Secondary | ICD-10-CM | POA: Diagnosis not present

## 2017-05-07 DIAGNOSIS — I272 Pulmonary hypertension, unspecified: Secondary | ICD-10-CM | POA: Insufficient documentation

## 2017-05-07 DIAGNOSIS — G4733 Obstructive sleep apnea (adult) (pediatric): Secondary | ICD-10-CM | POA: Insufficient documentation

## 2017-05-07 DIAGNOSIS — Z87891 Personal history of nicotine dependence: Secondary | ICD-10-CM | POA: Insufficient documentation

## 2017-05-07 DIAGNOSIS — E079 Disorder of thyroid, unspecified: Secondary | ICD-10-CM | POA: Diagnosis not present

## 2017-05-07 DIAGNOSIS — D472 Monoclonal gammopathy: Secondary | ICD-10-CM

## 2017-05-07 LAB — COMPLETE METABOLIC PANEL WITH GFR
ALBUMIN: 3.6 g/dL (ref 3.6–5.1)
ALT: 10 U/L (ref 6–29)
AST: 14 U/L (ref 10–35)
Alkaline Phosphatase: 130 U/L (ref 33–130)
BUN: 24 mg/dL (ref 7–25)
CALCIUM: 9.2 mg/dL (ref 8.6–10.4)
CHLORIDE: 108 mmol/L (ref 98–110)
CO2: 25 mmol/L (ref 20–31)
Creat: 1.01 mg/dL — ABNORMAL HIGH (ref 0.60–0.93)
GFR, Est African American: 63 mL/min (ref >=60)
GFR, Est Non African American: 55 mL/min — ABNORMAL LOW (ref >=60)
Glucose, Bld: 132 mg/dL — ABNORMAL HIGH (ref 65–99)
Potassium: 4.4 mmol/L (ref 3.5–5.3)
Sodium: 141 mmol/L (ref 135–146)
Total Bilirubin: 0.4 mg/dL (ref 0.2–1.2)
Total Protein: 6.4 g/dL (ref 6.1–8.1)

## 2017-05-07 LAB — CBC WITH DIFFERENTIAL/PLATELET
BASOS ABS: 0 10*3/uL (ref 0.0–0.1)
Basophils Relative: 0 %
EOS PCT: 5 %
Eosinophils Absolute: 0.3 10*3/uL (ref 0.0–0.7)
HCT: 32.6 % — ABNORMAL LOW (ref 36.0–46.0)
Hemoglobin: 10.4 g/dL — ABNORMAL LOW (ref 12.0–15.0)
Lymphocytes Relative: 30 %
Lymphs Abs: 1.9 10*3/uL (ref 0.7–4.0)
MCH: 27.7 pg (ref 26.0–34.0)
MCHC: 31.9 g/dL (ref 30.0–36.0)
MCV: 86.7 fL (ref 78.0–100.0)
Monocytes Absolute: 0.7 10*3/uL (ref 0.1–1.0)
Monocytes Relative: 11 %
Neutro Abs: 3.3 10*3/uL (ref 1.7–7.7)
Neutrophils Relative %: 54 %
PLATELETS: 236 10*3/uL (ref 150–400)
RBC: 3.76 MIL/uL — AB (ref 3.87–5.11)
RDW: 18.6 % — ABNORMAL HIGH (ref 11.5–15.5)
WBC: 6.2 10*3/uL (ref 4.0–10.5)

## 2017-05-07 LAB — LIPID PANEL
Cholesterol: 223 mg/dL — ABNORMAL HIGH (ref <200)
HDL: 44 mg/dL — AB (ref >50)
LDL Cholesterol: 150 mg/dL — ABNORMAL HIGH (ref <100)
Total CHOL/HDL Ratio: 5.1 Ratio — ABNORMAL HIGH (ref <5.0)
Triglycerides: 143 mg/dL (ref <150)
VLDL: 29 mg/dL (ref <30)

## 2017-05-07 LAB — BASIC METABOLIC PANEL
Anion gap: 8 (ref 5–15)
BUN: 23 mg/dL — AB (ref 6–20)
CO2: 26 mmol/L (ref 22–32)
Calcium: 9.5 mg/dL (ref 8.9–10.3)
Chloride: 106 mmol/L (ref 101–111)
Creatinine, Ser: 1.12 mg/dL — ABNORMAL HIGH (ref 0.44–1.00)
GFR calc Af Amer: 55 mL/min — ABNORMAL LOW (ref >60)
GFR, EST NON AFRICAN AMERICAN: 47 mL/min — AB (ref >60)
Glucose, Bld: 175 mg/dL — ABNORMAL HIGH (ref 65–99)
POTASSIUM: 4.3 mmol/L (ref 3.5–5.1)
SODIUM: 140 mmol/L (ref 135–145)

## 2017-05-07 LAB — CBC
HEMATOCRIT: 32.1 % — AB (ref 35.0–45.0)
Hemoglobin: 10.1 g/dL — ABNORMAL LOW (ref 11.7–15.5)
MCH: 26.8 pg — AB (ref 27.0–33.0)
MCHC: 31.5 g/dL — ABNORMAL LOW (ref 32.0–36.0)
MCV: 85.1 fL (ref 80.0–100.0)
PLATELETS: 224 10*3/uL (ref 140–400)
RBC: 3.77 MIL/uL — AB (ref 3.80–5.10)
RDW: 19.1 % — ABNORMAL HIGH (ref 11.0–15.0)
WBC: 6.1 10*3/uL (ref 3.8–10.8)

## 2017-05-07 LAB — HEMOGLOBIN A1C
Hgb A1c MFr Bld: 6.8 % — ABNORMAL HIGH (ref <5.7)
Mean Plasma Glucose: 148 mg/dL

## 2017-05-07 NOTE — Progress Notes (Addendum)
[place], [place] [address]   CLINIC:  Medical Oncology/Hematology  PCP:  Jenna Helper, MD [address], [facility] [place] [address] [phone]   REASON FOR VISIT:  Follow-up for Anemia   CURRENT THERAPY: Work-up for anemia    HISTORY OF PRESENT ILLNESS:  (From Kirby Crigler, PA-C's last visit on 03/25/17)     INTERVAL HISTORY:  [name] 76 y.o. female returns for routine follow-up for anemia.   She recently had extensive workup with lab studies; they were reviewed with her via phone, but she has some additional questions and would like me to review today.    Overall, she tells me that she "doesn't feel great."  Reports fatigue, with energy levels 25%. States that her appetite is not very good, also at 25%.  Weight is stable for past 5 months. Denies fever, chills, or night sweats.  Denies any blood in her stool, hematuria/dysuria, vaginal bleeding, nosebleeds, or gingival bleeding.  Endorses occasional constipation, for which she takes Miralax PRN.  She saw her PCP yesterday; reports that she forgot to mention to her PCP that her left ear has been feeling very full/throbbing at night.  She would like me to examine her ears today.  Reports that she has had some seasonal allergies recently; takes Zyrtec daily.     REVIEW OF SYSTEMS:  Review of Systems  Constitutional: Positive for appetite change and fatigue. Negative for chills and fever.  HENT:  Negative.  Negative for lump/mass and nosebleeds.        (L) ear fullness/aching.   Eyes: Negative.   Respiratory: Negative.  Negative for cough and shortness of breath.   Cardiovascular: Negative.  Negative for chest pain and leg swelling.  Gastrointestinal: Positive for constipation. Negative for abdominal pain, blood in stool, diarrhea, nausea and vomiting.  Endocrine: Negative.   Genitourinary: Negative.  Negative for dysuria, hematuria and vaginal bleeding.     Musculoskeletal: Positive for arthralgias (chronic arthralgias and back pain ) and back pain.  Skin: Negative.  Negative for rash.  Neurological: Negative.  Negative for dizziness and headaches.  Hematological: Negative.  Negative for adenopathy. Does not bruise/bleed easily.  Psychiatric/Behavioral: Negative.  Negative for depression and sleep disturbance. The patient is not nervous/anxious.      PAST MEDICAL/SURGICAL HISTORY:  Past Medical History:  Diagnosis Date  . Anemia in chronic renal disease 12/30/2015  . Anxiety   . Arthritis    "all over" (01/19/2014)  . Carpal tunnel syndrome   . Chronic bronchitis ([place])    "got it q yr for awhile; hasn't had it in awhile" (01/19/2014)  . Chronic kidney disease (CKD) stage G3b/A1, moderately decreased glomerular filtration rate (GFR) between 30-44 mL/min/1.73 square meter and albuminuria creatinine ratio less than 30 mg/g 09/14/2009   Qualifier: Diagnosis of  By: Moshe Cipro MD, Joycelyn Schmid    . Chronic renal disease, stage 3, moderately decreased glomerular filtration rate (GFR) between 30-59 mL/min/1.73 square meter 09/14/2009   Qualifier: Diagnosis of  By: Moshe Cipro MD, Joycelyn Schmid    . Depression   . Diverticulosis 07/2004   Colonscopy Dr Gala Romney  . Esophagitis, erosive 2009  . Exertional shortness of breath   . Gastroesophageal reflux   . IAXKPVVZ(482.7)    "usually a couple times/wk" (01/19/2014)  . Heart murmur    saw cardiology [place], he told her she did not need to come back.  . Hyperlipidemia   . Hypertension   . Impingement  syndrome, shoulder   . Iron deficiency anemia   . Mitral regurgitation   . Myasthenia gravis   . Myasthenia gravis in remission ([place]) 11/24/2014  . Obesity   . OSA on CPAP   . Pneumonia 01/2012  . Pulmonary HTN ([place])   . Schatzki's ring    Last EGD w/ dilation 02/08/11, 2009 & 2007  . Seasonal allergies   . Shoulder pain   . Thyroid disease    "used to take RX; they took me off it" (01/19/2014)  . Tobacco  abuse   . Type II diabetes mellitus ([place])    Past Surgical History:  Procedure Laterality Date  . [place] VITRECTOMY WITH 20 GAUGE MVR PORT Left 01/19/2014   Procedure: 25 GAUGE PARS PLANA VITRECTOMY WITH 20 GAUGE MVR PORT; MEMBRAME PEEL; SERUM PATCH; LASER TREATMENT; C3F8;  Surgeon: Hayden Pedro, MD;  Location: [facility];  Service: Ophthalmology;  Laterality: Left;  . ABDOMINAL HYSTERECTOMY    . APPENDECTOMY    . CARPAL TUNNEL RELEASE Bilateral   . CATARACT EXTRACTION W/PHACO Left 10/21/2013   Procedure: LEFT CATARACT EXTRACTION PHACO AND INTRAOCULAR LENS PLACEMENT (IOC);  Surgeon: Marylynn Pearson, MD;  Location: [facility];  Service: Ophthalmology;  Laterality: Left;  . CHOLECYSTECTOMY    . COLONOSCOPY  05/08/2012   MOL:MBEMLJQG and external hemorrhoids; colonic diverticulosis  . ESOPHAGEAL DILATION     "more than 3 times" (01/19/2014)  . ESOPHAGOGASTRODUODENOSCOPY  02/08/11   [place]-Distal esophageal erosion consistent with mild erosive reflux   esophagitis/ Noncritical Schatzki ring, small hiatal hernia otherwise upper/ gastrointestinal tract appeared unremarkable, status post passage  of a Maloney dilation to biopsy disruption of the ring described  . ESOPHAGOGASTRODUODENOSCOPY  04/2012   2 tandem incomplete distal esophagea rings s/p dilation.   . ESOPHAGOGASTRODUODENOSCOPY N/A 09/16/2014   Procedure: ESOPHAGOGASTRODUODENOSCOPY (EGD);  Surgeon: Daneil Dolin, MD;  [facility];  Service: Endoscopy;  Laterality: N/A;  1200  . EYE SURGERY    . INCONTINENCE SURGERY  08/26/09   [place]  . JOINT REPLACEMENT     right total knee  . MALONEY DILATION N/A 09/16/2014   Procedure: Venia Minks DILATION;  Surgeon: Daneil Dolin, MD;  [facility];  Service: Endoscopy;  Laterality: N/A;  . PARS PLANA VITRECTOMY W/ REPAIR OF MACULAR HOLE Left 01/19/2014  . SAVORY DILATION N/A 09/16/2014   Procedure: SAVORY DILATION;  Surgeon: Daneil Dolin, MD;  [facility];  Service:  Endoscopy;  Laterality: N/A;  . TONSILLECTOMY    . TOTAL KNEE ARTHROPLASTY Right 05/13/07   Dr. Aline Brochure     SOCIAL HISTORY:  Social History   Social History  . Marital status: Single    Spouse name: N/A  . Number of children: 2  . Years of education: N/A   Occupational History  . disabled Retired   Social History Main Topics  . Smoking status: Former Smoker    Packs/day: 0.50    Years: 25.00    Types: Cigarettes    Quit date: 01/01/2004  . Smokeless tobacco: Never Used  . Alcohol use No  . Drug use: No  . Sexual activity: No   Other Topics Concern  . Not on file   Social History Narrative   Patient lives at home by herself   Patient drinks one cup of coffee a day   Patient is right handed.    FAMILY HISTORY:  Family History  Problem Relation Age of Onset  . Cancer  Mother   . Heart disease Mother   . Cancer Father   . Heart disease Father   . Diabetes Sister   . Hypertension Brother   . Heart disease Brother   . Cancer Sister   . Kidney failure Sister   . Diabetes Son   . Hypertension Son   . Hypertension Daughter   . Colon cancer Neg Hx     CURRENT MEDICATIONS:  Outpatient Encounter Prescriptions as of 05/07/2017  Medication Sig Note  . acetaminophen (TYLENOL) 500 MG tablet One tablet at bedtime   . amLODipine (NORVASC) 10 MG tablet TAKE 1 TABLET BY MOUTH ONCE A DAY.   Marland Kitchen B-D ULTRAFINE III SHORT PEN 31G X 8 MM MISC USE ONCE DAILY WITH LANTUS SOLOSTAR PEN.   . brimonidine-timolol (COMBIGAN) 0.2-0.5 % ophthalmic solution Place 1 drop into both eyes every 12 (twelve) hours.   . cetirizine (ZYRTEC) 10 MG tablet Take 1 tablet (10 mg total) by mouth daily. (Patient taking differently: Take 10 mg by mouth daily. )   . citalopram (CELEXA) 40 MG tablet TAKE ONE TABLET BY MOUTH DAILY.   . clonazePAM (KLONOPIN) 0.5 MG tablet TAKE ONE TABLET BY MOUTH 3 TIMES DAILY AS NEEDED FOR ANXIETY.   . Cranberry 1000 MG CAPS Take 1,000 mg by mouth daily.    . cycloSPORINE  (RESTASIS) 0.05 % ophthalmic emulsion Place 1 drop into both eyes 2 (two) times daily.    Marland Kitchen diltiazem (CARDIZEM CD) 240 MG 24 hr capsule TAKE (1) CAPSULE BY MOUTH TWICE DAILY.   Marland Kitchen Flaxseed, Linseed, (FLAXSEED OIL) 1000 MG CAPS Take 1,000 mg by mouth daily.   . fluticasone (FLONASE) 50 MCG/ACT nasal spray Place 2 sprays into both nostrils daily. (Patient taking differently: Place 2 sprays into both nostrils as needed. )   . furosemide (LASIX) 40 MG tablet Take 1 tablet (40 mg total) by mouth 2 (two) times daily. 02/05/2017: Takes once daily   . gabapentin (NEURONTIN) 300 MG capsule Take 1 capsule (300 mg total) by mouth 2 (two) times daily.   . hydrALAZINE (APRESOLINE) 25 MG tablet TAKE 1 TABLET BY MOUTH THREE TIMES A DAY.   Marland Kitchen HYDROcodone-acetaminophen (NORCO) 10-325 MG tablet One tablet twice daily   . Insulin Glargine (LANTUS SOLOSTAR) 100 UNIT/ML Solostar Pen Inject 30 Units into the skin daily at 10 pm.   . IRON PO Take 1 tablet by mouth 2 (two) times daily.   Marland Kitchen losartan (COZAAR) 100 MG tablet TAKE (1) TABLET BY MOUTH DAILY.   . Multiple Vitamin (MULTIVITAMIN WITH MINERALS) TABS tablet Take 1 tablet by mouth at bedtime.   . ondansetron (ZOFRAN) 4 MG tablet TAKE (1) TABLET BY MOUTH EVERY FOUR HOURS AS NEEDED FOR NAUSEA.   Glory Rosebush DELICA LANCETS 26Z MISC USE AS DIRECTED 3 TIMES DAILY FOR BLOOD GLUCOSE TESTING.   Glory Rosebush VERIO test strip USE AS DIRECTED TO TEST BLOOD GLUCOSE 3 TIMES DAILY.   . pantoprazole (PROTONIX) 40 MG tablet Take 1 tablet (40 mg total) by mouth daily before breakfast. Stop omeprazole.   . polyethylene glycol powder (GLYCOLAX/MIRALAX) powder Take 17 g by mouth daily.   . prednisoLONE acetate (PRED FORTE) 1 % ophthalmic suspension Place 1 drop into the left eye 4 (four) times daily.   . predniSONE (DELTASONE) 5 MG tablet TAKE 1 TABLET BY MOUTH DAILY.   Marland Kitchen PROAIR HFA 108 (90 Base) MCG/ACT inhaler INHALE 2 PUFFS BY MOUTH EVERY 6 HOURS AS NEEDED .   Marland Kitchen pyridostigmine (MESTINON)  60 MG tablet TAKE (1) TABLET BY MOUTH THREE TIMES A DAY.   . rosuvastatin (CRESTOR) 40 MG tablet TAKE (1) TABLET BY MOUTH AT BEDTIME.   Marland Kitchen solifenacin (VESICARE) 10 MG tablet Take 1 tablet (10 mg total) by mouth daily.   Marland Kitchen spironolactone (ALDACTONE) 25 MG tablet TAKE 1 TABLET BY MOUTH ONCE A DAY.   Marland Kitchen temazepam (RESTORIL) 30 MG capsule Take 30 mg by mouth at bedtime as needed for sleep.   . TRADJENTA 5 MG TABS tablet TAKE 1 TABLET BY MOUTH ONCE A DAY.   Marland Kitchen venlafaxine XR (EFFEXOR-XR) 150 MG 24 hr capsule TAKE 1 CAPSULE BY MOUTH DAILY.   . VESICARE 5 MG tablet Take 5 mg by mouth daily.    . Vitamin D, Ergocalciferol, (DRISDOL) 50000 units CAPS capsule TAKE 1 CAPSULE BY MOUTH ONCE A WEEK.    No facility-administered encounter medications on file as of 05/07/2017.     ALLERGIES:  No Known Allergies   PHYSICAL EXAM:  ECOG Performance status: 1 - Symptomatic; largely independent.   Vitals:   05/07/17 1243  BP: (!) 190/61  Pulse: (!) 57  Resp: 18  Temp: 98.4 F (36.9 C)   Filed Weights   05/07/17 1243  Weight: 251 lb 3.2 oz (113.9 kg)   *Patient reports she did not take her BP medications this morning. -gwd  Physical Exam  Constitutional: She is oriented to person, place, and time and well-developed, well-nourished, and in no distress.  HENT:  Head: Normocephalic and atraumatic.  Mouth/Throat: Oropharynx is clear and moist. No oropharyngeal exudate.  Bilat TMs visualized; there is evidence of possible mild middle ear effusion bilaterally without erythema; no evidence of infection.   Eyes: Conjunctivae are normal. Pupils are equal, round, and reactive to light. No scleral icterus.  Neck: Normal range of motion. Neck supple.  Cardiovascular: Normal rate and regular rhythm.   Pulmonary/Chest: Effort normal and breath sounds normal. No respiratory distress. She has no wheezes.  Abdominal: Soft. Bowel sounds are normal. She exhibits no distension. There is no tenderness. There is no  rebound and no guarding.  Musculoskeletal: Normal range of motion. She exhibits no edema.  -Requires assistance to get onto exam table.  -Ambulates with cane   Lymphadenopathy:    She has no cervical adenopathy.  Neurological: She is alert and oriented to person, place, and time. No cranial nerve deficit.  Skin: Skin is warm and dry. No rash noted.  Psychiatric: Mood, memory, affect and judgment normal.  Nursing note and vitals reviewed.    LABORATORY DATA:  I have reviewed the labs as listed.  CBC    Component Value Date/Time   WBC 6.2 05/07/2017 1158   RBC 3.76 (L) 05/07/2017 1158   HGB 10.4 (L) 05/07/2017 1158   HCT 32.6 (L) 05/07/2017 1158   PLT 236 05/07/2017 1158   MCV 86.7 05/07/2017 1158   MCH 27.7 05/07/2017 1158   MCHC 31.9 05/07/2017 1158   RDW 18.6 (H) 05/07/2017 1158   LYMPHSABS 1.9 05/07/2017 1158   MONOABS 0.7 05/07/2017 1158   EOSABS 0.3 05/07/2017 1158   BASOSABS 0.0 05/07/2017 1158   CMP Latest Ref Rng & Units 05/07/2017 05/06/2017 03/25/2017  Glucose 65 - 99 mg/dL 175(H) 132(H) 125(H)  BUN 6 - 20 mg/dL 23(H) 24 20  Creatinine 0.44 - 1.00 mg/dL 1.12(H) 1.01(H) 0.87  Sodium 135 - 145 mmol/L 140 141 139  Potassium 3.5 - 5.1 mmol/L 4.3 4.4 3.4(L)  Chloride 101 - 111 mmol/L 106  108 103  CO2 22 - 32 mmol/L _0 Calcium 8.9 - 10.3 mg/dL 9.5 9.2 9.2  Total Protein 6.1 - 8.1 g/dL - 6.4 -  Total Bilirubin 0.2 - 1.2 mg/dL - 0.4 -  Alkaline Phos 33 - 130 U/L - 130 -  AST 10 - 35 U/L - 14 -  ALT 6 - 29 U/L - 10 -    Ref. Range 03/25/2017 15:07  LDH Latest Ref Range: 98 - 192 U/L 195 (H)    Ref. Range 03/25/2017 15:07  Iron Latest Ref Range: 28 - 170 ug/dL 120  UIBC Latest Units: ug/dL 262  TIBC Latest Ref Range: 250 - 450 ug/dL 382  Saturation Ratios Latest Ref Range: 10.4 - 31.8 % 31  Ferritin Latest Ref Range: 11 - 307 ng/mL 26    Ref. Range 03/25/2017 15:07  CRP Latest Ref Range: <1.0 mg/dL <0.8  Vitamin B12 Latest Ref Range: 180 - 914 pg/mL 573     Ref. Range 03/25/2017 15:08  Total Protein ELP Latest Ref Range: 6.0 - 8.5 g/dL 6.7  Albumin ELP Latest Ref Range: 2.9 - 4.4 g/dL 3.2  Globulin, Total Latest Ref Range: 2.2 - 3.9 g/dL 3.5  A/G Ratio Latest Ref Range: 0.7 - 1.7  0.9  Alpha-1-Globulin Latest Ref Range: 0.0 - 0.4 g/dL 0.2  Alpha-2-Globulin Latest Ref Range: 0.4 - 1.0 g/dL 0.7  Beta Globulin Latest Ref Range: 0.7 - 1.3 g/dL 1.2  Gamma Globulin Latest Ref Range: 0.4 - 1.8 g/dL 1.3  M-SPIKE, % Latest Ref Range: Not Observed g/dL Not Observed    Ref. Range 03/25/2017 15:07  IgG (Immunoglobin G), Serum Latest Ref Range: 700 - 1,600 mg/dL 1,179  IgA Latest Ref Range: 64 - 422 mg/dL 255  IgM, Serum Latest Ref Range: 26 - 217 mg/dL 125  Kappa free light chain Latest Ref Range: 3.3 - 19.4 mg/L 23.6 (H)  Lamda free light chains Latest Ref Range: 5.7 - 26.3 mg/L 18.4  Kappa, lamda light chain ratio Latest Ref Range: 0.26 - 1.65  1.28    Ref. Range 03/25/2017 15:07  Sed Rate Latest Ref Range: 0 - 22 mm/hr 44 (H)   Results for SHAWNDRA, CLUTE (MRN [id number])   Ref. Range 04/08/2017 16:30 04/08/2017 16:30 04/08/2017 16:30  Fecal Occult Blood, POC Latest Ref Range: NEGATIVE  NEGATIVE NEGATIVE NEGATIVE         PENDING LABS:    DIAGNOSTIC IMAGING:    PATHOLOGY:     ASSESSMENT & PLAN:   Anemia of unknown etiology:  -Chronic anemia noted with hemoglobin 10.4 g/dL today.  Kidney function is only mildly decreased with EGFR 55 and creatinine 1.12; this is not thought to be reason for persistent anemia.   -No active bleeding; Fecal occult blood stool cards negative x 3. Iron studies stable with adequate iron saturations.  Folate/B12 levels normal.  -Explained differential diagnoses for anemia with patient. Shared with her that in order for [place] to help definitively understand why she continues to have anemia, we need to evaluate her bone marrow with bone marrow biopsy.  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    Monoclonal gammopathy of unknown significance:   -Reviewed previous labs with patient from 02/2017. Discussed monoclonal gammopathy with  IgA monoclonal gammopathy noted on IFE, (no M spike on SPEP) with mildly elevated kappa free light chain at 23.6.  A bone marrow aspiration and biopsy is indicated in all patients with an  M protein ?1.5 g/dL, patients with IgA MGUS of any size, patients with an abnormal serum free light chain ratio (ie, ratio of kappa to lambda free light chains <0.26 or >1.65), and in all patients who have any abnormalities of the complete blood count (CBC), serum creatinine, serum calcium, or radiographic bone survey. This practice is supported by a study of 1271 patients with MGUS or multiple myeloma with minimal bone pain (grade 0/1) whose initial work-up included bone marrow evaluation and skeletal survey (without serum free light chain analysis) with the following results:   ?Among patients with an M-protein ?1.5 g/dL, the percentage of patients with bone marrow plasma cell infiltration >10 percent was 7.3 percent overall, but ranged from 4.7 percent to 20 percent in those with IgG and IgA isotypes, respectively.   ?For those with an IgG M-protein <0.5 g/dL, <1 percent had bone marrow plasma cell infiltration >10 percent by morphology.   ?Similarly, among those with an M protein ?1.5 g/dL, the percentage of patients with bone lesions on skeletal survey was <2 percent, irrespective of isotype.   These results support the omission of the bone marrow evaluation in patients with IgG type MGUS with serum M protein <1.5 g/dL, a normal serum FLC ratio, and with no bone pain or clinical concern for myeloma. Bone marrow may also be deferred in older asymptomatic adults or in frail older adults with limited life expectancy in whom myeloma or a related malignancy is considered unlikely, who can be safely followed. Although data are not available to support this approach, we also  often defer a bone marrow biopsy in patients with IgM MGUS with a small M protein (<1.5 g/dL), normal serum FLC ratio, with no evidence of anemia, lymphadenopathy, or organomegaly.    Bone marrow biopsy discussion:  -Discussed the reasoning for bone marrow biopsy, as well as the procedure itself. Discussed that there are 2 options for having the procedure done, which includes image-guided biopsy in [facility] with conscious sedation vs local anesthetic alone done here at [facility].  Discussed possible risks of bone marrow biopsy including pain, bleeding, and infection.  -She is very reluctant to proceed with bone marrow biopsy.  She is concerned about pain, but does not have transportation to [place].  She also expresses that she "just really doesn't want to get the test done."  She is able to verbalize the reasoning for why we are requesting the biopsy. She continues to decline.  -Instead, she elects to proceed with peripheral blood monitoring going forward for now.  Of course, if she changes her mind, we are happy to get her scheduled for procedure.    (L) ear fullness/dull ache: -No evidence of infection on exam. Possible small bilateral middle ear effusions, could be secondary to seasonal allergies.  -Recommended she add OTC Flonase to her daily Zytec. If no improvement in symptoms, then return to PCP.      Dispo:  -Labs only in 3 months. (CBC with diff, CMET, SPEP, IFE, IgG/IgM/IgA, & kappa/lambda light chains) -Return to cancer center in 6 months for continued follow-up with labs. (CBC with diff, CMET, SPEP, IFE, IgG/IgM/IgA, & kappa/lambda light chains)   All questions were answered to patient's stated satisfaction. Encouraged patient to call with any new concerns or questions before her next visit to the cancer center and we can certain see her sooner, if needed.    Plan of care discussed with Dr. Talbert Cage, who agrees with the above aforementioned.  Orders placed  this encounter:  Orders Placed This Encounter  Procedures  . CBC with Differential/Platelet  . Comprehensive metabolic panel  . Immunofixation electrophoresis  . IgG, IgA, IgM  . Protein electrophoresis, serum  . Kappa/lambda light chains      Mike Craze, NP [place] [phone]

## 2017-05-07 NOTE — Patient Instructions (Addendum)
[place] at [facility] Discharge Instructions  RECOMMENDATIONS MADE BY THE CONSULTANT AND ANY TEST RESULTS WILL BE SENT TO YOUR REFERRING PHYSICIAN.  You were seen today by Mike Craze NP. Try Flonase with your Zyrtec to help with your allergies and ear pain. Call us if you change your mind about the bone marrow biopsy. Return in 3 months for labs. Return in 6 months for labs and follow up.  MAKE SURE TO TAKE YOUR BP MEDICATION WHEN YOU GET HOME!!   Thank you for choosing [place] at [facility] to provide your oncology and hematology care.  To afford each patient quality time with our provider, please arrive at least 15 minutes before your scheduled appointment time.    If you have a lab appointment with the [facility] please come in thru the  Main Entrance and check in at the main information desk  You need to re-schedule your appointment should you arrive 10 or more minutes late.  We strive to give you quality time with our providers, and arriving late affects you and other patients whose appointments are after yours.  Also, if you no show three or more times for appointments you may be dismissed from the clinic at the providers discretion.     Again, thank you for choosing [facility].  Our hope is that these requests will decrease the amount of time that you wait before being seen by our physicians.       _____________________________________________________________  Should you have questions after your visit to [facility], please contact our office at [phone] between the hours of 8:30 a.m. and 4:30 p.m.  Voicemails left after 4:30 p.m. will not be returned until the following business day.  For prescription refill requests, have your pharmacy contact our office.       Resources For Cancer Patients and their Caregivers ? American Cancer Society: Can assist with transportation, wigs, general  needs, runs Look Good Feel Better.        702-211-5447 ? Cancer Care: Provides financial assistance, online support groups, medication/co-pay assistance.  1-800-813-HOPE 805-154-9306) ? [facility] Assists [place] cancer patients and their families through emotional , educational and financial support.  301-351-7329 ? [place] DSS Where to apply for food stamps, Medicaid and utility assistance. 770-645-5833 ? [facility]: Transportation to medical appointments. 404-772-1855 ? Social Security Administration: May apply for disability if have a Stage IV cancer. 509-404-1242 321 572 5294 ? [employer], Disability and Transit Services: Assists with nutrition, care and transit needs. [place] Support Programs: _0 @ > Cancer Support Group  2nd Tuesday of the month 1pm-2pm, Journey Room  > Creative Journey  3rd Tuesday of the month 1130am-1pm, Journey Room  > Look Good Feel Better  1st Wednesday of the month 10am-12 noon, Journey Room (Call [place] to register [phone])

## 2017-05-12 ENCOUNTER — Encounter: Payer: Self-pay | Admitting: Family Medicine

## 2017-05-12 ENCOUNTER — Telehealth: Payer: Self-pay | Admitting: Family Medicine

## 2017-05-12 NOTE — Assessment & Plan Note (Signed)
Controlled, no change in medication  

## 2017-05-12 NOTE — Telephone Encounter (Signed)
Pls call pt/ verify if actually taking crestor 40 mg daily and let me know . Cholesterol uncontrolled, and review of record suggests she is not on statin currently

## 2017-05-12 NOTE — Assessment & Plan Note (Signed)
Controlled, no change in medication [name] is reminded of the importance of commitment to daily physical activity for 30 minutes or more, as able and the need to limit carbohydrate intake to 30 to 60 grams per meal to help with blood sugar control.   The need to take medication as prescribed, test blood sugar as directed, and to call between visits if there is a concern that blood sugar is uncontrolled is also discussed.   [name] is reminded of the importance of daily foot exam, annual eye examination, and good blood sugar, blood pressure and cholesterol control.  Diabetic Labs Latest Ref Rng & Units 05/07/2017 05/06/2017 03/25/2017 01/14/2017 12/12/2016  HbA1c <5.7 % - 6.8(H) - - -  Microalbumin Not Estab. ug/mL - - - - 617.3(H)  Micro/Creat Ratio 0.0 - 30.0 mg/g creat - - - - 480.8(H)  Chol <200 mg/dL - 223(H) - 188 -  HDL >50 mg/dL - 44(L) - 39(L) -  Calc LDL <100 mg/dL - 150(H) - 128(H) -  Triglycerides <150 mg/dL - 143 - 105 -  Creatinine 0.44 - 1.00 mg/dL 1.12(H) 1.01(H) 0.87 1.37(H) -   BP/Weight 05/07/2017 05/06/2017 05/06/2017 03/25/2017 03/20/2017 02/05/2017 02/16/4714  Systolic BP 953 967 289 791 - 504 136  Diastolic BP 61 70 70 63 - 60 80  Wt. (Lbs) 251.2 250.12 250 245 253 252 252  BMI 41.8 41.62 41.6 39.54 40.84 40.67 40.67   Foot/eye exam completion dates Latest Ref Rng & Units 02/18/2017 08/15/2016  Eye Exam No Retinopathy Retinopathy(A) -  Foot Form Completion - - Done

## 2017-05-12 NOTE — Progress Notes (Signed)
[name]     MRN: [id number]      DOB: 11-Apr-1941   HPI [name] is here for follow up and re-evaluation of chronic medical conditions, medication management and review of any available recent lab and radiology data.  Preventive health is updated, specifically  Cancer screening and Immunization.   Pain management is reviewed patient reports good control on current regime , registry is reviewed during the visit also The PT denies any adverse reactions to current medications since the last visit.  c/o inadequate control of urinary incontinence, and medication dose is adjusted Denies polyuria, polydipsia, blurred vision , or hypoglycemic episodes.  ROS Denies recent fever or chills. Denies sinus pressure, nasal congestion, ear pain or sore throat. Denies chest congestion, productive cough or wheezing. Denies chest pains, palpitations and leg swelling Denies abdominal pain, nausea, vomiting,diarrhea or constipation.   Denies dysuria, frequency, hesitancy  Denies uncontrolled joint pain, swelling and limitation in mobility. Denies headaches, seizures, numbness, or tingling. Denies depression, anxiety or insomnia. Denies skin break down or rash.   PE  BP 138/70   Pulse 62   Resp 16   Ht 5\' 5"  (1.651 m)   Wt 250 lb (113.4 kg)   SpO2 98%   BMI 41.60 kg/m   Patient alert and oriented and in no cardiopulmonary distress.  HEENT: No facial asymmetry, EOMI,   oropharynx pink and moist.  Neck supple no JVD, no mass.  Chest: Clear to auscultation bilaterally.  CVS: S1, S2 no murmurs, no S3.Regular rate.  ABD: Soft non tender.   Ext: No edema  MS: Adequate  Though reduced ROM spine, shoulders, hips and knees.  Skin: Intact, no ulcerations or rash noted.  Psych: Good eye contact, normal affect. Memory intact not anxious or depressed appearing.  CNS: CN 2-12 intact, power,  normal throughout.no focal deficits noted.   Assessment & Plan  Encounter for chronic pain  management Adequate pain control reported with adequate level of function on current regime. Narcotic registry reviewed, 12 week medication supply provided  Essential hypertension, benign Controlled, no change in medication DASH diet and commitment to daily physical activity for a minimum of 30 minutes discussed and encouraged, as a part of hypertension management. The importance of attaining a healthy weight is also discussed.  BP/Weight 05/07/2017 05/06/2017 05/06/2017 03/25/2017 03/20/2017 02/05/2017 04/15/6062  Systolic BP 016 010 932 355 - 732 202  Diastolic BP 61 70 70 63 - 60 80  Wt. (Lbs) 251.2 250.12 250 245 253 252 252  BMI 41.8 41.62 41.6 39.54 40.84 40.67 40.67       Diabetes mellitus, insulin dependent (IDDM), controlled (HCC) Controlled, no change in medication [name] is reminded of the importance of commitment to daily physical activity for 30 minutes or more, as able and the need to limit carbohydrate intake to 30 to 60 grams per meal to help with blood sugar control.   The need to take medication as prescribed, test blood sugar as directed, and to call between visits if there is a concern that blood sugar is uncontrolled is also discussed.   [name] is reminded of the importance of daily foot exam, annual eye examination, and good blood sugar, blood pressure and cholesterol control.  Diabetic Labs Latest Ref Rng & Units 05/07/2017 05/06/2017 03/25/2017 01/14/2017 12/12/2016  HbA1c <5.7 % - 6.8(H) - - -  Microalbumin Not Estab. ug/mL - - - - 617.3(H)  Micro/Creat Ratio 0.0 - 30.0 mg/g creat - - - -  480.8(H)  Chol <200 mg/dL - 223(H) - 188 -  HDL >50 mg/dL - 44(L) - 39(L) -  Calc LDL <100 mg/dL - 150(H) - 128(H) -  Triglycerides <150 mg/dL - 143 - 105 -  Creatinine 0.44 - 1.00 mg/dL 1.12(H) 1.01(H) 0.87 1.37(H) -   BP/Weight 05/07/2017 05/06/2017 05/06/2017 03/25/2017 03/20/2017 02/05/2017 5/79/7282  Systolic BP 060 156 153 794 - 327 614  Diastolic BP 61 70 70 63 - 60 80  Wt.  (Lbs) 251.2 250.12 250 245 253 252 252  BMI 41.8 41.62 41.6 39.54 40.84 40.67 40.67   Foot/eye exam completion dates Latest Ref Rng & Units 02/18/2017 08/15/2016  Eye Exam No Retinopathy Retinopathy(A) -  Foot Form Completion - - Done        Depression with anxiety Controlled, no change in medication   Morbid obesity Deteriorated. Patient re-educated about  the importance of commitment to a  minimum of 150 minutes of exercise per week.  The importance of healthy food choices with portion control discussed. Encouraged to start a food diary, count calories and to consider  joining a support group. Sample diet sheets offered. Goals set by the patient for the next several months.   Weight /BMI 05/07/2017 05/06/2017 05/06/2017  WEIGHT 251 lb 3.2 oz 250 lb 1.9 oz 250 lb  HEIGHT - 5\' 5"  5\' 5"   BMI 41.8 kg/m2 41.62 kg/m2 41.6 kg/m2      Hyperlipemia Uncontrolled, need to ensure pt is actually taking a statin, will need to follow up on this Hyperlipidemia:Low fat diet discussed and encouraged.   Lipid Panel  Lab Results  Component Value Date   CHOL 223 (H) 05/06/2017   HDL 44 (L) 05/06/2017   LDLCALC 150 (H) 05/06/2017   TRIG 143 05/06/2017   CHOLHDL 5.1 (H) 05/06/2017

## 2017-05-12 NOTE — Assessment & Plan Note (Signed)
Adequate pain control reported with adequate level of function on current regime. Narcotic registry reviewed, 12 week medication supply provided

## 2017-05-12 NOTE — Assessment & Plan Note (Signed)
Controlled, no change in medication DASH diet and commitment to daily physical activity for a minimum of 30 minutes discussed and encouraged, as a part of hypertension management. The importance of attaining a healthy weight is also discussed.  BP/Weight 05/07/2017 05/06/2017 05/06/2017 03/25/2017 03/20/2017 02/05/2017 07/09/6268  Systolic BP 485 462 703 500 - 938 182  Diastolic BP 61 70 70 63 - 60 80  Wt. (Lbs) 251.2 250.12 250 245 253 252 252  BMI 41.8 41.62 41.6 39.54 40.84 40.67 40.67

## 2017-05-12 NOTE — Assessment & Plan Note (Signed)
Uncontrolled, need to ensure pt is actually taking a statin, will need to follow up on this Hyperlipidemia:Low fat diet discussed and encouraged.   Lipid Panel  Lab Results  Component Value Date   CHOL 223 (H) 05/06/2017   HDL 44 (L) 05/06/2017   LDLCALC 150 (H) 05/06/2017   TRIG 143 05/06/2017   CHOLHDL 5.1 (H) 05/06/2017

## 2017-05-12 NOTE — Assessment & Plan Note (Signed)
Deteriorated. Patient re-educated about  the importance of commitment to a  minimum of 150 minutes of exercise per week.  The importance of healthy food choices with portion control discussed. Encouraged to start a food diary, count calories and to consider  joining a support group. Sample diet sheets offered. Goals set by the patient for the next several months.   Weight /BMI 05/07/2017 05/06/2017 05/06/2017  WEIGHT 251 lb 3.2 oz 250 lb 1.9 oz 250 lb  HEIGHT - 5\' 5"  5\' 5"   BMI 41.8 kg/m2 41.62 kg/m2 41.6 kg/m2

## 2017-05-13 NOTE — Telephone Encounter (Signed)
Patient says she takes crestor 40mg  maybe every other day because she thinks it makes her hurt worse.

## 2017-05-14 NOTE — Telephone Encounter (Signed)
Encourage her to take daily and needs to reduce fat, cholesteorl too high and this inc heart disease risk

## 2017-05-14 NOTE — Telephone Encounter (Signed)
Pt aware.

## 2017-05-17 ENCOUNTER — Other Ambulatory Visit: Payer: Self-pay | Admitting: Family Medicine

## 2017-06-13 ENCOUNTER — Other Ambulatory Visit: Payer: Self-pay | Admitting: Family Medicine

## 2017-06-17 ENCOUNTER — Telehealth: Payer: Self-pay | Admitting: Orthopedic Surgery

## 2017-06-17 NOTE — Telephone Encounter (Signed)
Patient called to set up an appointment to see Dr. Aline Brochure. In our conversation I told her that the last several appointments that she had scheduled had been canceled. She informed me that she had been sent to a pain clinic. Can she be seen here?

## 2017-06-17 NOTE — Telephone Encounter (Signed)
Yes she just cant get medication from [place]

## 2017-06-24 ENCOUNTER — Ambulatory Visit: Payer: Medicare Other

## 2017-06-24 ENCOUNTER — Ambulatory Visit (INDEPENDENT_AMBULATORY_CARE_PROVIDER_SITE_OTHER): Payer: Medicare Other

## 2017-06-24 ENCOUNTER — Ambulatory Visit (INDEPENDENT_AMBULATORY_CARE_PROVIDER_SITE_OTHER): Payer: Medicare Other | Admitting: Orthopedic Surgery

## 2017-06-24 ENCOUNTER — Encounter: Payer: Self-pay | Admitting: Orthopedic Surgery

## 2017-06-24 VITALS — BP 175/83 | Ht 66.0 in | Wt 250.0 lb

## 2017-06-24 DIAGNOSIS — M25512 Pain in left shoulder: Secondary | ICD-10-CM

## 2017-06-24 DIAGNOSIS — M7552 Bursitis of left shoulder: Secondary | ICD-10-CM | POA: Diagnosis not present

## 2017-06-24 NOTE — Progress Notes (Signed)
NEW PROBLEM / OFFICE VISIT    Chief complaint left shoulder pain  76 year old female presents for evaluation of her left shoulder  She began having left shoulder pain about 2 weeks ago and relates it to hitting her shoulder against a car door. Over the 2 week interval she has noticed decreased ability to raise her arm above her head with periarticular left shoulder plain including posterior and lateral deltoid which has become constant and is unrelieved by Tylenol or hydrocodone    Review of Systems  Constitutional: Negative.   Skin: Negative.   Neurological: Negative for tingling.    Past Medical History:  Diagnosis Date  . Anemia in chronic renal disease 12/30/2015  . Anxiety   . Arthritis    "all over" (01/19/2014)  . Carpal tunnel syndrome   . Chronic bronchitis ([place])    "got it q yr for awhile; hasn't had it in awhile" (01/19/2014)  . Chronic kidney disease (CKD) stage G3b/A1, moderately decreased glomerular filtration rate (GFR) between 30-44 mL/min/1.73 square meter and albuminuria creatinine ratio less than 30 mg/g 09/14/2009   Qualifier: Diagnosis of  By: Moshe Cipro MD, Joycelyn Schmid    . Chronic renal disease, stage 3, moderately decreased glomerular filtration rate (GFR) between 30-59 mL/min/1.73 square meter 09/14/2009   Qualifier: Diagnosis of  By: Moshe Cipro MD, Joycelyn Schmid    . Depression   . Diverticulosis 07/2004   Colonscopy Dr Gala Romney  . Esophagitis, erosive 2009  . Exertional shortness of breath   . Gastroesophageal reflux   . BJYNWGNF(621.3)    "usually a couple times/wk" (01/19/2014)  . Heart murmur    saw cardiology In [place], he told her she did not need to come back.  . Hyperlipidemia   . Hypertension   . Impingement syndrome, shoulder   . Iron deficiency anemia   . Mitral regurgitation   . Myasthenia gravis   . Myasthenia gravis in remission ([place]) 11/24/2014  . Obesity   . OSA on CPAP   . Pneumonia 01/2012  . Pulmonary HTN ([place])   . Schatzki's ring    Last  EGD w/ dilation 02/08/11, 2009 & 2007  . Seasonal allergies   . Shoulder pain   . Thyroid disease    "used to take RX; they took me off it" (01/19/2014)  . Tobacco abuse   . Type II diabetes mellitus ([place])     Past Surgical History:  Procedure Laterality Date  . [place] VITRECTOMY WITH 20 GAUGE MVR PORT Left 01/19/2014   Procedure: 25 GAUGE PARS PLANA VITRECTOMY WITH 20 GAUGE MVR PORT; MEMBRAME PEEL; SERUM PATCH; LASER TREATMENT; C3F8;  Surgeon: Hayden Pedro, MD;  Location: [facility];  Service: Ophthalmology;  Laterality: Left;  . ABDOMINAL HYSTERECTOMY    . APPENDECTOMY    . CARPAL TUNNEL RELEASE Bilateral   . CATARACT EXTRACTION W/PHACO Left 10/21/2013   Procedure: LEFT CATARACT EXTRACTION PHACO AND INTRAOCULAR LENS PLACEMENT (IOC);  Surgeon: Marylynn Pearson, MD;  Location: [facility];  Service: Ophthalmology;  Laterality: Left;  . CHOLECYSTECTOMY    . COLONOSCOPY  05/08/2012   YQM:VHQIONGE and external hemorrhoids; colonic diverticulosis  . ESOPHAGEAL DILATION     "more than 3 times" (01/19/2014)  . ESOPHAGOGASTRODUODENOSCOPY  02/08/11   [place]-Distal esophageal erosion consistent with mild erosive reflux   esophagitis/ Noncritical Schatzki ring, small hiatal hernia otherwise upper/ gastrointestinal tract appeared unremarkable, status post passage  of a Maloney dilation to biopsy disruption of the ring described  . ESOPHAGOGASTRODUODENOSCOPY  04/2012  2 tandem incomplete distal esophagea rings s/p dilation.   . ESOPHAGOGASTRODUODENOSCOPY N/A 09/16/2014   Procedure: ESOPHAGOGASTRODUODENOSCOPY (EGD);  Surgeon: Daneil Dolin, MD;  [facility];  Service: Endoscopy;  Laterality: N/A;  1200  . EYE SURGERY    . INCONTINENCE SURGERY  08/26/09   [place]  . JOINT REPLACEMENT     right total knee  . MALONEY DILATION N/A 09/16/2014   Procedure: Venia Minks DILATION;  Surgeon: Daneil Dolin, MD;  [facility];  Service: Endoscopy;  Laterality: N/A;  . PARS PLANA VITRECTOMY W/  REPAIR OF MACULAR HOLE Left 01/19/2014  . SAVORY DILATION N/A 09/16/2014   Procedure: SAVORY DILATION;  Surgeon: Daneil Dolin, MD;  [facility];  Service: Endoscopy;  Laterality: N/A;  . TONSILLECTOMY    . TOTAL KNEE ARTHROPLASTY Right 05/13/07   Dr. Aline Brochure    Family History  Problem Relation Age of Onset  . Cancer Mother   . Heart disease Mother   . Cancer Father   . Heart disease Father   . Diabetes Sister   . Hypertension Brother   . Heart disease Brother   . Cancer Sister   . Kidney failure Sister   . Diabetes Son   . Hypertension Son   . Hypertension Daughter   . Colon cancer Neg Hx    Social History  Substance Use Topics  . Smoking status: Former Smoker    Packs/day: 0.50    Years: 25.00    Types: Cigarettes    Quit date: 01/01/2004  . Smokeless tobacco: Never Used  . Alcohol use No    There were no vitals taken for this visit.  Physical Exam  Constitutional: She is oriented to person, place, and time. She appears well-developed and well-nourished.  Neurological: She is alert and oriented to person, place, and time.  Psychiatric: She has a normal mood and affect.  Vitals reviewed.   Left Shoulder Exam   Tenderness  The patient is experiencing tenderness in the [facility] joint, acromion, and Posterior and lateral deltoid.  Range of Motion  Active Abduction:                       70 Passive Abduction:                    80 Extension:                                  30 Forward Flexion:                        100 External Rotation:                      30  Muscle Strength  Abduction:            5/5 Internal Rotation:  5/5 External Rotation: 5/5 Supraspinatus:     4/5 Subscapularis:     5/5 Biceps:                 5/5  Tests  Impingement:   Positive Cross Arm:      Negative Drop Arm:        Negative Apprehension: Negative Sulcus:            Negative  Right Shoulder Exam   Range of Motion  Normal right shoulder ROM  Muscle Strength   Supraspinatus:     5/5  Tests  Apprehension: Negative     XRAYS:   Encounter Diagnoses  Name Primary?  . Acute pain of left shoulder Yes  . Bursitis of left shoulder      PLAN:   Bursitis left shoulder  Recommend injection Active range of motion exercises Moist heat as needed  Procedure note the subacromial injection shoulder left   Verbal consent was obtained to inject the  Left   Shoulder  Timeout was completed to confirm the injection site is a subacromial space of the  left  shoulder  Medication used Depo-Medrol 40 mg and lidocaine 1% 3 cc  Anesthesia was provided by ethyl chloride  The injection was performed in the left  posterior subacromial space. After pinning the skin with alcohol and anesthetized the skin with ethyl chloride the subacromial space was injected using a 20-gauge needle. There were no complications  Sterile dressing was applied.

## 2017-06-24 NOTE — Patient Instructions (Addendum)
Shoulder exercises should be done for one month   Shoulder Range of Motion Exercises Shoulder range of motion (ROM) exercises are designed to keep the shoulder moving freely. They are often recommended for people who have shoulder pain. Phase 1 exercises When you are able, do this exercise 5-6 days per week, or as told by your health care provider. Work toward doing 2 sets of 10 swings. Pendulum Exercise How To Do This Exercise Lying Down 1. Lie face-down on a bed with your abdomen close to the side of the bed. 2. Let your arm hang over the side of the bed. 3. Relax your shoulder, arm, and hand. 4. Slowly and gently swing your arm forward and back. Do not use your neck muscles to swing your arm. They should be relaxed. If you are struggling to swing your arm, have someone gently swing it for you. When you do this exercise for the first time, swing your arm at a 15 degree angle for 15 seconds, or swing your arm 10 times. As pain lessens over time, increase the angle of the swing to 30-45 degrees. 5. Repeat steps 1-4 with the other arm.  How To Do This Exercise While Standing 1. Stand next to a sturdy chair or table and hold on to it with your hand. 1. Bend forward at the waist. 2. Bend your knees slightly. 3. Relax your other arm and let it hang limp. 4. Relax the shoulder blade of the arm that is hanging and let it drop. 5. While keeping your shoulder relaxed, use body motion to swing your arm in small circles. The first time you do this exercise, swing your arm for about 30 seconds or 10 times. When you do it next time, swing your arm for a little longer. 6. Stand up tall and relax. 7. Repeat steps 1-7, this time changing the direction of the circles. 2. Repeat steps 1-8 with the other arm.  Phase 2 exercises Do these exercises 3-4 times per day on 5-6 days per week or as told by your health care provider. Work toward holding the stretch for 20 seconds. Stretching Exercise 1 1. Lift  your arm straight out in front of you. 2. Bend your arm 90 degrees at the elbow (right angle) so your forearm goes across your body and looks like the letter "L." 3. Use your other arm to gently pull the elbow forward and across your body. 4. Repeat steps 1-3 with the other arm. Stretching Exercise 2 You will need a towel or rope for this exercise. 1. Bend one arm behind your back with the palm facing outward. 2. Hold a towel with your other hand. 3. Reach the arm that holds the towel above your head, and bend that arm at the elbow. Your wrist should be behind your neck. 4. Use your free hand to grab the free end of the towel. 5. With the higher hand, gently pull the towel up behind you. 6. With the lower hand, pull the towel down behind you. 7. Repeat steps 1-6 with the other arm.  Phase 3 exercises Do each of these exercises at four different times of day (sessions) every day or as told by your health care provider. To begin with, repeat each exercise 5 times (repetitions). Work toward doing 3 sets of 12 repetitions or as told by your health care provider. Strengthening Exercise 1 You will need a light weight for this activity. As you grow stronger, you may use a heavier weight.  1. Standing with a weight in your hand, lift your arm straight out to the side until it is at the same height as your shoulder. 2. Bend your arm at 90 degrees so that your fingers are pointing to the ceiling. 3. Slowly raise your hand until your arm is straight up in the air. 4. Repeat steps 1-3 with the other arm.  Strengthening Exercise 2 You will need a light weight for this activity. As you grow stronger, you may use a heavier weight. 1. Standing with a weight in your hand, gradually move your straight arm in an arc, starting at your side, then out in front of you, then straight up over your head. 2. Gradually move your other arm in an arc, starting at your side, then out in front of you, then straight up over  your head. 3. Repeat steps 1-2 with the other arm.  Strengthening Exercise 3 You will need an elastic band for this activity. As you grow stronger, gradually increase the size of the bands or increase the number of bands that you use at one time. 1. While standing, hold an elastic band in one hand and raise that arm up in the air. 2. With your other hand, pull down the band until that hand is by your side. 3. Repeat steps 1-2 with the other arm.  This information is not intended to replace advice given to you by your health care provider. Make sure you discuss any questions you have with your health care provider. Document Released: 09/15/2003 Document Revised: 08/12/2016 Document Reviewed: 12/13/2014 Elsevier Interactive Patient Education  [employer].

## 2017-07-17 ENCOUNTER — Other Ambulatory Visit: Payer: Self-pay | Admitting: Family Medicine

## 2017-07-22 ENCOUNTER — Ambulatory Visit: Payer: Medicare Other | Admitting: Family Medicine

## 2017-07-23 ENCOUNTER — Other Ambulatory Visit: Payer: Self-pay | Admitting: Family Medicine

## 2017-07-23 ENCOUNTER — Other Ambulatory Visit: Payer: Self-pay | Admitting: Gastroenterology

## 2017-08-05 ENCOUNTER — Other Ambulatory Visit: Payer: Self-pay

## 2017-08-05 ENCOUNTER — Ambulatory Visit: Payer: Medicare Other | Admitting: Family Medicine

## 2017-08-05 MED ORDER — HYDROCODONE-ACETAMINOPHEN 10-325 MG PO TABS: ORAL_TABLET | ORAL | 0 refills | Status: DC

## 2017-08-06 ENCOUNTER — Encounter: Payer: Self-pay | Admitting: Family Medicine

## 2017-08-06 ENCOUNTER — Ambulatory Visit (INDEPENDENT_AMBULATORY_CARE_PROVIDER_SITE_OTHER): Payer: Medicare Other | Admitting: Family Medicine

## 2017-08-06 VITALS — BP 130/60 | HR 54 | Temp 97.0°F | Resp 16 | Ht 66.0 in | Wt 241.2 lb

## 2017-08-06 DIAGNOSIS — E784 Other hyperlipidemia: Secondary | ICD-10-CM

## 2017-08-06 DIAGNOSIS — G4700 Insomnia, unspecified: Secondary | ICD-10-CM | POA: Diagnosis not present

## 2017-08-06 DIAGNOSIS — IMO0001 Reserved for inherently not codable concepts without codable children: Secondary | ICD-10-CM

## 2017-08-06 DIAGNOSIS — E119 Type 2 diabetes mellitus without complications: Secondary | ICD-10-CM | POA: Diagnosis not present

## 2017-08-06 DIAGNOSIS — G8929 Other chronic pain: Secondary | ICD-10-CM

## 2017-08-06 DIAGNOSIS — Z794 Long term (current) use of insulin: Secondary | ICD-10-CM

## 2017-08-06 DIAGNOSIS — Z79899 Other long term (current) drug therapy: Secondary | ICD-10-CM | POA: Diagnosis not present

## 2017-08-06 DIAGNOSIS — F418 Other specified anxiety disorders: Secondary | ICD-10-CM

## 2017-08-06 DIAGNOSIS — E7849 Other hyperlipidemia: Secondary | ICD-10-CM

## 2017-08-06 DIAGNOSIS — I10 Essential (primary) hypertension: Secondary | ICD-10-CM

## 2017-08-06 DIAGNOSIS — N39498 Other specified urinary incontinence: Secondary | ICD-10-CM | POA: Diagnosis not present

## 2017-08-06 MED ORDER — HYDROCODONE-ACETAMINOPHEN 10-325 MG PO TABS: ORAL_TABLET | ORAL | 0 refills | Status: DC

## 2017-08-06 MED ORDER — CITALOPRAM HYDROBROMIDE 20 MG PO TABS: 20.0000 mg | ORAL_TABLET | Freq: Every day | ORAL | 4 refills | Status: DC

## 2017-08-06 MED ORDER — GABAPENTIN 300 MG PO CAPS: 300.0000 mg | ORAL_CAPSULE | Freq: Every day | ORAL | 3 refills | Status: DC

## 2017-08-06 NOTE — Patient Instructions (Addendum)
F/u with [facility] in 5 weeks , call if you need me sooner  STOP zyrtec. Stop klonopin Stop restoril. Reduce gabapentin to ONE at bedtime ( was one twice daily) Reduce celexa to 20 mg daily ( was 40 mg daily)  Fasting lipid, cmp and EGFR and hBA1C 3 to 5 days next visit  No change in pain medication , you will get 3 month script today

## 2017-08-07 ENCOUNTER — Other Ambulatory Visit (HOSPITAL_COMMUNITY): Payer: Medicare Other

## 2017-08-10 DIAGNOSIS — Z79899 Other long term (current) drug therapy: Secondary | ICD-10-CM | POA: Insufficient documentation

## 2017-08-10 NOTE — Assessment & Plan Note (Addendum)
Over sedated , restorl dose reduced , with an aim to discontinuing altogether Sleep hygiene reviewed and written information offered also. Prescription sent for  medication needed.

## 2017-08-10 NOTE — Assessment & Plan Note (Signed)
The patient's Controlled Substance registry is reviewed and compliance confirmed. Adequacy of  Pain control and level of function is assessed. Medication dosing is adjusted as deemed appropriate. Twelve weeks of medication is prescribed , patient signs for the script and is provided with a follow up appointment between 11 to 12 weeks .  

## 2017-08-10 NOTE — Assessment & Plan Note (Signed)
Pt on multiple potentially sedating drugs and obviously over sedated at visit, reduction oin  Doses of several meds and discontinuation of zyrtec, with close f/u

## 2017-08-10 NOTE — Assessment & Plan Note (Signed)
Improved on current dose of medication so despite adverse s/e will continue this dose as her incontinence has been very disabling

## 2017-08-10 NOTE — Assessment & Plan Note (Signed)
Over medicated and sedated, dose reduction in selexa and klonopin with 5 week follow up

## 2017-08-10 NOTE — Assessment & Plan Note (Signed)
Controlled, no change in medication DASH diet and commitment to daily physical activity for a minimum of 30 minutes discussed and encouraged, as a part of hypertension management. The importance of attaining a healthy weight is also discussed.  BP/Weight 08/06/2017 06/24/2017 05/07/2017 05/06/2017 05/06/2017 03/25/2017 2/68/3419  Systolic BP 622 297 989 211 941 740 -  Diastolic BP 60 83 61 70 70 63 -  Wt. (Lbs) 241.25 250 251.2 250.12 250 245 253  BMI 38.94 40.35 41.8 41.62 41.6 39.54 40.84

## 2017-08-10 NOTE — Progress Notes (Signed)
[name]     MRN: [id number]      DOB: 24-Nov-1941   HPI [name] is here for follow up and re-evaluation of chronic medical conditions, medication management and review of any available recent lab and radiology data.  Also here for chronic pain management was the main reason for visit , but noted to be excessively sedated so completed med review and  Adjustment made Preventive health is updated, specifically  Cancer screening and Immunization.   . The PT denies any adverse reactions to current medications since the last visit.  Pt is excessively sleepy and drowsy at the visit, and medication review reveals excess medication and a lot of this is reduced   ROS Denies recent fever or chills. Denies sinus pressure, nasal congestion, ear pain or sore throat. Denies chest congestion, productive cough or wheezing. Denies chest pains, palpitations and leg swelling Denies abdominal pain, nausea, vomiting,diarrhea or constipation.   Denies dysuria, frequency, hesitancy states improvement in  incontinence. Denies uncontrolled  joint pain, swelling and limitation in mobility. Denies headaches, seizures, numbness, or tingling. Denies uncontrolled depression, anxiety or insomnia. Denies skin break down or rash.   PE  BP 130/60 (BP Location: Left Arm, Patient Position: Sitting, Cuff Size: Large)   Pulse (!) 54   Temp (!) 97 F (36.1 C) (Other (Comment))   Resp 16   Ht 5\' 6"  (1.676 m)   Wt 241 lb 4 oz (109.4 kg)   SpO2 94%   BMI 38.94 kg/m   Patient alert but drowsy,and oriented and in no cardiopulmonary distress.  HEENT: No facial asymmetry, EOMI,   oropharynx pink and moist.  Neck supple no JVD, no mass.  Chest: Clear to auscultation bilaterally.  CVS: S1, S2 no murmurs, no S3.Regular rate.  ABD: Soft non tender.   Ext: One plus pitting edema  MS: Decreased  ROM spine, shoulders, hips and knees.  Skin: Intact, no ulcerations or rash noted.  Psych: Good eye  contact, drowsy.  not anxious or depressed appearing.  CNS: CN 2-12 intact, power,  normal throughout.no focal deficits noted.   Assessment & Plan  Encounter for chronic pain management The patient's Controlled Substance registry is reviewed and compliance confirmed. Adequacy of  Pain control and level of function is assessed. Medication dosing is adjusted as deemed appropriate. Twelve weeks of medication is prescribed , patient signs for the script and is provided with a follow up appointment between 11 to 12 weeks .   Depression with anxiety Over medicated and sedated, dose reduction in selexa and klonopin with 5 week follow up  Essential hypertension, benign Controlled, no change in medication DASH diet and commitment to daily physical activity for a minimum of 30 minutes discussed and encouraged, as a part of hypertension management. The importance of attaining a healthy weight is also discussed.  BP/Weight 08/06/2017 06/24/2017 05/07/2017 05/06/2017 05/06/2017 03/25/2017 7/65/4650  Systolic BP 354 656 812 751 700 174 -  Diastolic BP 60 83 61 70 70 63 -  Wt. (Lbs) 241.25 250 251.2 250.12 250 245 253  BMI 38.94 40.35 41.8 41.62 41.6 39.54 40.84       Insomnia Over sedated , restorl dose reduced , with an aim to discontinuing altogether Sleep hygiene reviewed and written information offered also. Prescription sent for  medication needed.   Encounter for medication management Pt on multiple potentially sedating drugs and obviously over sedated at visit, reduction oin  Doses of several meds and discontinuation of zyrtec, with  close f/u  Urinary incontinence Improved on current dose of medication so despite adverse s/e will continue this dose as her incontinence has been very disabling

## 2017-08-19 ENCOUNTER — Other Ambulatory Visit: Payer: Self-pay | Admitting: Family Medicine

## 2017-08-19 NOTE — Telephone Encounter (Signed)
Seen 8 7 18 

## 2017-08-30 ENCOUNTER — Other Ambulatory Visit: Payer: Self-pay | Admitting: Family Medicine

## 2017-09-05 ENCOUNTER — Telehealth: Payer: Self-pay | Admitting: Family Medicine

## 2017-09-05 ENCOUNTER — Other Ambulatory Visit: Payer: Self-pay | Admitting: Family Medicine

## 2017-09-05 MED ORDER — CETIRIZINE HCL 10 MG PO TABS: 10.0000 mg | ORAL_TABLET | Freq: Every day | ORAL | 5 refills | Status: DC

## 2017-09-05 NOTE — Progress Notes (Unsigned)
Zyrtec  

## 2017-09-05 NOTE — Telephone Encounter (Signed)
No allergy medication on file, patient needs to have pharmacy send refill request

## 2017-09-05 NOTE — Telephone Encounter (Signed)
Patient is requesting refill for allergy medication  Cb#: [phone]

## 2017-09-05 NOTE — Telephone Encounter (Signed)
Patient is requesting an rx for zyrtec; states she used to receive this from [facility] and was taken off of it. [facility]  Cb#: [phone]

## 2017-09-05 NOTE — Telephone Encounter (Signed)
Sent to [facility]

## 2017-09-05 NOTE — Telephone Encounter (Signed)
Routing to Dr. Simpson for approval 

## 2017-09-11 ENCOUNTER — Encounter: Payer: Self-pay | Admitting: Family Medicine

## 2017-09-11 ENCOUNTER — Ambulatory Visit (INDEPENDENT_AMBULATORY_CARE_PROVIDER_SITE_OTHER): Payer: Medicare Other | Admitting: Family Medicine

## 2017-09-11 VITALS — BP 160/80 | HR 63 | Temp 97.4°F | Resp 18 | Ht 66.0 in | Wt 249.8 lb

## 2017-09-11 DIAGNOSIS — E119 Type 2 diabetes mellitus without complications: Secondary | ICD-10-CM

## 2017-09-11 DIAGNOSIS — Z794 Long term (current) use of insulin: Secondary | ICD-10-CM | POA: Diagnosis not present

## 2017-09-11 DIAGNOSIS — Z23 Encounter for immunization: Secondary | ICD-10-CM | POA: Diagnosis not present

## 2017-09-11 DIAGNOSIS — G8929 Other chronic pain: Secondary | ICD-10-CM

## 2017-09-11 DIAGNOSIS — I10 Essential (primary) hypertension: Secondary | ICD-10-CM | POA: Diagnosis not present

## 2017-09-11 DIAGNOSIS — M25512 Pain in left shoulder: Secondary | ICD-10-CM | POA: Diagnosis not present

## 2017-09-11 DIAGNOSIS — IMO0001 Reserved for inherently not codable concepts without codable children: Secondary | ICD-10-CM

## 2017-09-11 NOTE — Patient Instructions (Addendum)
F/u week October 15 , call if you need me sooner  Labs today, already ordered  Flu vaccine today  Stay on same med dose  Foot exam qualifies you for diabetic shoes  You are referred to Dr Aline Brochure re left shoulder  Please remember to take BP meds every day at the same time  Thank you  for choosing [facility]. We consider it a privelige to serve you.  Delivering excellent health care in a caring and  compassionate way is our goal.  Partnering with you,  so that together we can achieve this goal is our strategy.

## 2017-09-11 NOTE — Assessment & Plan Note (Signed)
Uncontrolled left shoulder pain , needs ortho re  eval

## 2017-09-13 DIAGNOSIS — E119 Type 2 diabetes mellitus without complications: Secondary | ICD-10-CM | POA: Diagnosis not present

## 2017-09-13 DIAGNOSIS — E784 Other hyperlipidemia: Secondary | ICD-10-CM | POA: Diagnosis not present

## 2017-09-13 DIAGNOSIS — Z794 Long term (current) use of insulin: Secondary | ICD-10-CM | POA: Diagnosis not present

## 2017-09-13 DIAGNOSIS — I10 Essential (primary) hypertension: Secondary | ICD-10-CM | POA: Diagnosis not present

## 2017-09-14 DIAGNOSIS — G8929 Other chronic pain: Secondary | ICD-10-CM | POA: Insufficient documentation

## 2017-09-14 DIAGNOSIS — M25512 Pain in left shoulder: Secondary | ICD-10-CM

## 2017-09-14 LAB — COMPLETE METABOLIC PANEL WITH GFR
AG Ratio: 1.2 (calc) (ref 1.0–2.5)
ALBUMIN MSPROF: 3.6 g/dL (ref 3.6–5.1)
ALKALINE PHOSPHATASE (APISO): 124 U/L (ref 33–130)
ALT: 10 U/L (ref 6–29)
AST: 15 U/L (ref 10–35)
BUN: 11 mg/dL (ref 7–25)
CHLORIDE: 107 mmol/L (ref 98–110)
CO2: 29 mmol/L (ref 20–32)
Calcium: 9.3 mg/dL (ref 8.6–10.4)
Creat: 0.87 mg/dL (ref 0.60–0.93)
GFR, Est African American: 76 mL/min/{1.73_m2} (ref >=60)
GFR, Est Non African American: 66 mL/min/{1.73_m2} (ref >=60)
GLUCOSE: 114 mg/dL — AB (ref 65–99)
Globulin: 3 g/dL (calc) (ref 1.9–3.7)
Potassium: 4.3 mmol/L (ref 3.5–5.3)
Sodium: 141 mmol/L (ref 135–146)
Total Bilirubin: 0.8 mg/dL (ref 0.2–1.2)
Total Protein: 6.6 g/dL (ref 6.1–8.1)

## 2017-09-14 LAB — LIPID PANEL
Cholesterol: 160 mg/dL (ref <200)
HDL: 47 mg/dL — ABNORMAL LOW (ref >50)
LDL CHOLESTEROL (CALC): 92 mg/dL
Non-HDL Cholesterol (Calc): 113 mg/dL (calc) (ref <130)
Total CHOL/HDL Ratio: 3.4 (calc) (ref <5.0)
Triglycerides: 117 mg/dL (ref <150)

## 2017-09-14 LAB — HEMOGLOBIN A1C
HEMOGLOBIN A1C: 6.5 %{Hb} — AB (ref <5.7)
Mean Plasma Glucose: 140 (calc)
eAG (mmol/L): 7.7 (calc)

## 2017-09-14 NOTE — Assessment & Plan Note (Signed)
Uncontrolled , non compliant, has not taken medication on day of visit DASH diet and commitment to daily physical activity for a minimum of 30 minutes discussed and encouraged, as a part of hypertension management. The importance of attaining a healthy weight is also discussed.  BP/Weight 09/11/2017 08/06/2017 06/24/2017 05/07/2017 05/06/2017 05/06/2017 2/64/1583  Systolic BP 094 076 808 811 031 594 585  Diastolic BP 80 60 83 61 70 70 63  Wt. (Lbs) 249.75 241.25 250 251.2 250.12 250 245  BMI 40.31 38.94 40.35 41.8 41.62 41.6 39.54

## 2017-09-14 NOTE — Assessment & Plan Note (Signed)
Improved level of alertness with reduced medication dose and adequate pain control, pt to remain on current medication doses Return mid October for medication

## 2017-09-14 NOTE — Assessment & Plan Note (Signed)
[name] is reminded of the importance of commitment to daily physical activity for 30 minutes or more, as able and the need to limit carbohydrate intake to 30 to 60 grams per meal to help with blood sugar control.   The need to take medication as prescribed, test blood sugar as directed, and to call between visits if there is a concern that blood sugar is uncontrolled is also discussed.   [name] is reminded of the importance of daily foot exam, annual eye examination, and good blood sugar, blood pressure and cholesterol control. Updated lab needed at/ before next visit.   Diabetic Labs Latest Ref Rng & Units 09/13/2017 05/07/2017 05/06/2017 03/25/2017 01/14/2017  HbA1c <5.7 % - - 6.8(H) - -  Microalbumin Not Estab. ug/mL - - - - -  Micro/Creat Ratio 0.0 - 30.0 mg/g creat - - - - -  Chol <200 mg/dL 160 - 223(H) - 188  HDL >50 mg/dL 47(L) - 44(L) - 39(L)  Calc LDL <100 mg/dL - - 150(H) - 128(H)  Triglycerides <150 mg/dL 117 - 143 - 105  Creatinine 0.60 - 0.93 mg/dL 0.87 1.12(H) 1.01(H) 0.87 1.37(H)   BP/Weight 09/11/2017 08/06/2017 06/24/2017 05/07/2017 05/06/2017 05/06/2017 7/61/6073  Systolic BP 710 626 948 546 270 350 093  Diastolic BP 80 60 83 61 70 70 63  Wt. 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

## 2017-09-14 NOTE — Assessment & Plan Note (Signed)
Increased pain and educed mobility in past several weeks, refer to orthopedics for reveal and management

## 2017-09-14 NOTE — Progress Notes (Signed)
[name]     MRN: [id number]      DOB: 06-May-1941   HPI [name] is here for follow up and re-evaluation of chronic medical conditions, medication management and review of any available recent lab and radiology data.  Preventive health is updated, specifically  Cancer screening and Immunization.   Questions or concerns regarding consultations or procedures which the PT has had in the interim are  addressed. The PT denies any adverse reactions to current medications since the last visit.  C/o increased left shoulder pian with reduced mobility has seen ortho in the past and needs to retrurn   ROS Denies recent fever or chills. Denies sinus pressure, nasal congestion, ear pain or sore throat. Denies chest congestion, productive cough or wheezing. Denies chest pains, palpitations and leg swelling Denies abdominal pain, nausea, vomiting,diarrhea or constipation.   Denies dysuria, frequency, hesitancy or incontinence. Denies joint pain, swelling and limitation in mobility. Denies headaches, seizures, numbness, or tingling. Denies depression, anxiety or insomnia. Denies skin break down or rash.   PE BP 160/80 Pulse 63   Temp (!) 97.4 F (36.3 C) (Other (Comment))   Resp 18   Ht 5\' 6"  (1.676 m)   Wt 249 lb 12 oz (113.3 kg)   SpO2 98%   BMI 40.31 kg/m   Patient alert and oriented and in no cardiopulmonary distress.  HEENT: No facial asymmetry, EOMI,   oropharynx pink and moist.  Neck supple no JVD, no mass.  Chest: Clear to auscultation bilaterally.  CVS: S1, S2 no murmurs, no S3.Regular rate.  ABD: Soft non tender.   Ext: No edema  MS: Adequate though reduced  ROM spine,, hips and knees. Markedly reduced ROM left shoulder Skin: Intact, no ulcerations or rash noted.  Psych: Good eye contact, normal affect. Memory intact not anxious or depressed appearing.  CNS: CN 2-12 intact, power,  normal throughout.no focal deficits noted.   Assessment &  Plan  SHOULDER PAIN Uncontrolled left shoulder pain , needs ortho re  eval  Diabetes mellitus, insulin dependent (IDDM), controlled ([place]) [name] is reminded of the importance of commitment to daily physical activity for 30 minutes or more, as able and the need to limit carbohydrate intake to 30 to 60 grams per meal to help with blood sugar control.   The need to take medication as prescribed, test blood sugar as directed, and to call between visits if there is a concern that blood sugar is uncontrolled is also discussed.   [name] is reminded of the importance of daily foot exam, annual eye examination, and good blood sugar, blood pressure and cholesterol control. Updated lab needed at/ before next visit.   Diabetic Labs Latest Ref Rng & Units 09/13/2017 05/07/2017 05/06/2017 03/25/2017 01/14/2017  HbA1c <5.7 % - - 6.8(H) - -  Microalbumin Not Estab. ug/mL - - - - -  Micro/Creat Ratio 0.0 - 30.0 mg/g creat - - - - -  Chol <200 mg/dL 160 - 223(H) - 188  HDL >50 mg/dL 47(L) - 44(L) - 39(L)  Calc LDL <100 mg/dL - - 150(H) - 128(H)  Triglycerides <150 mg/dL 117 - 143 - 105  Creatinine 0.60 - 0.93 mg/dL 0.87 1.12(H) 1.01(H) 0.87 1.37(H)   BP/Weight 09/11/2017 08/06/2017 06/24/2017 05/07/2017 05/06/2017 05/06/2017 4/66/5993  Systolic BP 570 177 939 030 092 330 076  Diastolic BP 80 60 83 61 70 70 63  Wt. 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 The importance of attaining a healthy weight is also discussed.  BP/Weight 09/11/2017 08/06/2017  06/24/2017 05/07/2017 05/06/2017 05/06/2017 4/74/2595  Systolic BP 638 756 433 295 188 416 606  Diastolic BP 80 60 83 61 70 70 63  Wt. (Lbs) 249.75 241.25 250 251.2 250.12 250 245  BMI 40.31 38.94 40.35 41.8 41.62 41.6 39.54       Encounter for chronic pain management Improved level of alertness with reduced medication dose and adequate pain control, pt to remain on current medication doses Return mid October for medication  Chronic left shoulder pain Increased pain and educed mobility in past several weeks, refer to orthopedics for reveal and management

## 2017-09-16 ENCOUNTER — Other Ambulatory Visit: Payer: Self-pay | Admitting: Family Medicine

## 2017-09-17 ENCOUNTER — Encounter: Payer: Self-pay | Admitting: Family Medicine

## 2017-10-15 ENCOUNTER — Other Ambulatory Visit: Payer: Self-pay | Admitting: Family Medicine

## 2017-10-16 ENCOUNTER — Ambulatory Visit: Payer: Medicare Other | Admitting: Family Medicine

## 2017-10-28 ENCOUNTER — Encounter: Payer: Self-pay | Admitting: Orthopedic Surgery

## 2017-10-29 ENCOUNTER — Encounter: Payer: Self-pay | Admitting: Orthopedic Surgery

## 2017-11-01 ENCOUNTER — Other Ambulatory Visit: Payer: Self-pay | Admitting: Family Medicine

## 2017-11-06 ENCOUNTER — Ambulatory Visit (INDEPENDENT_AMBULATORY_CARE_PROVIDER_SITE_OTHER): Payer: Medicare Other | Admitting: Family Medicine

## 2017-11-06 ENCOUNTER — Encounter: Payer: Self-pay | Admitting: Family Medicine

## 2017-11-06 VITALS — BP 138/82 | HR 55 | Resp 16 | Ht 66.0 in | Wt 257.0 lb

## 2017-11-06 DIAGNOSIS — IMO0001 Reserved for inherently not codable concepts without codable children: Secondary | ICD-10-CM

## 2017-11-06 DIAGNOSIS — G8929 Other chronic pain: Secondary | ICD-10-CM | POA: Diagnosis not present

## 2017-11-06 DIAGNOSIS — E559 Vitamin D deficiency, unspecified: Secondary | ICD-10-CM | POA: Diagnosis not present

## 2017-11-06 DIAGNOSIS — E032 Hypothyroidism due to medicaments and other exogenous substances: Secondary | ICD-10-CM | POA: Diagnosis not present

## 2017-11-06 DIAGNOSIS — R413 Other amnesia: Secondary | ICD-10-CM | POA: Diagnosis not present

## 2017-11-06 DIAGNOSIS — E119 Type 2 diabetes mellitus without complications: Secondary | ICD-10-CM

## 2017-11-06 DIAGNOSIS — Z794 Long term (current) use of insulin: Secondary | ICD-10-CM | POA: Diagnosis not present

## 2017-11-06 DIAGNOSIS — E7849 Other hyperlipidemia: Secondary | ICD-10-CM

## 2017-11-06 DIAGNOSIS — M159 Polyosteoarthritis, unspecified: Secondary | ICD-10-CM

## 2017-11-06 DIAGNOSIS — I10 Essential (primary) hypertension: Secondary | ICD-10-CM | POA: Diagnosis not present

## 2017-11-06 MED ORDER — HYDROCODONE-ACETAMINOPHEN 10-325 MG PO TABS: ORAL_TABLET | ORAL | 0 refills | Status: DC

## 2017-11-06 NOTE — Patient Instructions (Addendum)
F/u week of January 7 with mini mental status exam  You are being referred for a brain scan due to memory loss  Please get fasting lipid, cmp and eGFr, hBA1C  2nd week in December, vit B12 and RPR  Please be careful not to fall  No changes iin medication management Thank you  for choosing [facility]. We consider it a privelige to serve you.  Delivering excellent health care in a caring and  compassionate way is our goal.  Partnering with you,  so that together we can achieve this goal is our strategy.

## 2017-11-07 ENCOUNTER — Ambulatory Visit (HOSPITAL_COMMUNITY): Payer: Medicare Other

## 2017-11-07 ENCOUNTER — Other Ambulatory Visit (HOSPITAL_COMMUNITY): Payer: Medicare Other

## 2017-11-10 DIAGNOSIS — R413 Other amnesia: Secondary | ICD-10-CM | POA: Insufficient documentation

## 2017-11-10 NOTE — Progress Notes (Signed)
[name]     MRN: [id number]      DOB: 02-16-1941   HPI [name] is here for follow up and re-evaluation of chronic medical conditions, medication management in particular chronic pain management ,and review of any available recent lab and radiology data.  Preventive health is updated, specifically  Cancer screening and Immunization.   Questions or concerns regarding consultations or procedures which the PT has had in the interim are  addressed. The PT denies any adverse reactions to current medications since the last visit.  C/o increasing  memory loss children notice as well and are concerned, also has excessive fatigue  ROS Denies recent fever or chills. Denies sinus pressure, nasal congestion, ear pain or sore throat. Denies chest congestion, productive cough or wheezing. Denies chest pains, palpitations and leg swelling Denies abdominal pain, nausea, vomiting,diarrhea or constipation.   Denies dysuria, frequency, hesitancy or incontinence. C/o chronic   joint pain, swelling and limitation in mobility. Denies headaches, seizures, numbness, or tingling. Denies depression, anxiety or insomnia. Denies skin break down or rash.   PE  BP 138/82   Pulse (!) 55   Resp 16   Ht 5\' 6"  (1.676 m)   Wt 257 lb (116.6 kg)   SpO2 95%   BMI 41.48 kg/m   Patient drowsy and oriented x 3. HEENT: No facial asymmetry, EOMI,   oropharynx pink and moist.  Neck decreased ROM no JVD, no mass.  Chest: Clear to auscultation bilaterally.  CVS: S1, S2 no murmurs, no S3.Regular rate.  ABD: Soft non tender.   Ext: No edema  MS: Decreased  ROM spine, shoulders, hips and knees.  Skin: Intact, no ulcerations or rash noted.  Psych: Good eye contact,Drowsy. Memory impaired  not anxious or depressed appearing.  CNS: CN 2-12 intact, power,  normal throughout.no focal deficits noted.   Assessment & Plan  Encounter for chronic pain management The patient's Controlled Substance  registry is reviewed and compliance confirmed. Adequacy of  Pain control and level of function is assessed. Medication dosing is adjusted as deemed appropriate. Twelve weeks of medication is prescribed , patient signs for the script and is provided with a follow up appointment between 11 to 12 weeks .   Essential hypertension, benign Controlled, no change in medication   Diabetes mellitus, insulin dependent (IDDM), controlled ([facility]) Controlled, no change in medication [name] is reminded of the importance of commitment to daily physical activity for 30 minutes or more, as able and the need to limit carbohydrate intake to 30 to 60 grams per meal to help with blood sugar control.   The need to take medication as prescribed, test blood sugar as directed, and to call between visits if there is a concern that blood sugar is uncontrolled is also discussed.   [name] is reminded of the importance of daily foot exam, annual eye examination, and good blood sugar, blood pressure and cholesterol control.  Diabetic Labs Latest Ref Rng & Units 09/13/2017 05/07/2017 05/06/2017 03/25/2017 01/14/2017  HbA1c <5.7 % of total Hgb 6.5(H) - 6.8(H) - -  Microalbumin Not Estab. ug/mL - - - - -  Micro/Creat Ratio 0.0 - 30.0 mg/g creat - - - - -  Chol <200 mg/dL 160 - 223(H) - 188  HDL >50 mg/dL 47(L) - 44(L) - 39(L)  Calc LDL <100 mg/dL - - 150(H) - 128(H)  Triglycerides <150 mg/dL 117 - 143 - 105  Creatinine 0.60 - 0.93 mg/dL 0.87 1.12(H) 1.01(H) 0.87  1.37(H)   BP/Weight 11/06/2017 09/11/2017 08/06/2017 06/24/2017 05/07/2017 09/04/6212 0/07/6577  Systolic BP 469 629 528 413 244 010 272  Diastolic BP 82 80 60 83 61 70 70  Wt. 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

## 2017-11-10 NOTE — Assessment & Plan Note (Addendum)
Reports increased memory loss over the past 6 to  Months, short term more than long term, patient and family increasingly concerned, needs brain scan for evaluation, also noted to be increasingly / excessively sleepy

## 2017-11-10 NOTE — Assessment & Plan Note (Signed)
The patient's Controlled Substance registry is reviewed and compliance confirmed. Adequacy of  Pain control and level of function is assessed. Medication dosing is adjusted as deemed appropriate. Twelve weeks of medication is prescribed , patient signs for the script and is provided with a follow up appointment between 11 to 12 weeks .  

## 2017-11-10 NOTE — Assessment & Plan Note (Signed)
Controlled, no change in medication  

## 2017-11-10 NOTE — Assessment & Plan Note (Signed)
Increased fall risk due to severe generalized osteoarthritis, home safety and fall precautions reviewed

## 2017-11-10 NOTE — Assessment & Plan Note (Signed)
Controlled, no change in medication [name] is reminded of the importance of commitment to daily physical activity for 30 minutes or more, as able and the need to limit carbohydrate intake to 30 to 60 grams per meal to help with blood sugar control.   The need to take medication as prescribed, test blood sugar as directed, and to call between visits if there is a concern that blood sugar is uncontrolled is also discussed.   [name] is reminded of the importance of daily foot exam, annual eye examination, and good blood sugar, blood pressure and cholesterol control.  Diabetic Labs Latest Ref Rng & Units 09/13/2017 05/07/2017 05/06/2017 03/25/2017 01/14/2017  HbA1c <5.7 % of total Hgb 6.5(H) - 6.8(H) - -  Microalbumin Not Estab. ug/mL - - - - -  Micro/Creat Ratio 0.0 - 30.0 mg/g creat - - - - -  Chol <200 mg/dL 160 - 223(H) - 188  HDL >50 mg/dL 47(L) - 44(L) - 39(L)  Calc LDL <100 mg/dL - - 150(H) - 128(H)  Triglycerides <150 mg/dL 117 - 143 - 105  Creatinine 0.60 - 0.93 mg/dL 0.87 1.12(H) 1.01(H) 0.87 1.37(H)   BP/Weight 11/06/2017 09/11/2017 08/06/2017 06/24/2017 05/07/2017 01/01/1974 08/07/3253  Systolic BP 982 641 583 094 076 808 811  Diastolic BP 82 80 60 83 61 70 70  Wt. 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

## 2017-11-12 ENCOUNTER — Other Ambulatory Visit: Payer: Self-pay | Admitting: Family Medicine

## 2017-11-18 ENCOUNTER — Other Ambulatory Visit: Payer: Self-pay | Admitting: Family Medicine

## 2017-11-26 ENCOUNTER — Encounter (HOSPITAL_BASED_OUTPATIENT_CLINIC_OR_DEPARTMENT_OTHER): Payer: Medicare Other | Admitting: Oncology

## 2017-11-26 ENCOUNTER — Encounter (HOSPITAL_COMMUNITY): Payer: Medicare Other | Attending: Adult Health

## 2017-11-26 ENCOUNTER — Other Ambulatory Visit: Payer: Self-pay

## 2017-11-26 ENCOUNTER — Encounter (HOSPITAL_COMMUNITY): Payer: Self-pay

## 2017-11-26 VITALS — BP 147/50 | HR 48 | Temp 98.8°F | Resp 16 | Wt 242.8 lb

## 2017-11-26 DIAGNOSIS — D472 Monoclonal gammopathy: Secondary | ICD-10-CM | POA: Insufficient documentation

## 2017-11-26 DIAGNOSIS — N189 Chronic kidney disease, unspecified: Principal | ICD-10-CM

## 2017-11-26 DIAGNOSIS — D509 Iron deficiency anemia, unspecified: Secondary | ICD-10-CM | POA: Diagnosis not present

## 2017-11-26 DIAGNOSIS — D649 Anemia, unspecified: Secondary | ICD-10-CM

## 2017-11-26 DIAGNOSIS — D631 Anemia in chronic kidney disease: Secondary | ICD-10-CM

## 2017-11-26 LAB — COMPREHENSIVE METABOLIC PANEL
ALT: 11 U/L — AB (ref 14–54)
AST: 16 U/L (ref 15–41)
Albumin: 3.7 g/dL (ref 3.5–5.0)
Alkaline Phosphatase: 130 U/L — ABNORMAL HIGH (ref 38–126)
Anion gap: 7 (ref 5–15)
BILIRUBIN TOTAL: 0.4 mg/dL (ref 0.3–1.2)
BUN: 20 mg/dL (ref 6–20)
CO2: 24 mmol/L (ref 22–32)
CREATININE: 0.99 mg/dL (ref 0.44–1.00)
Calcium: 9.2 mg/dL (ref 8.9–10.3)
Chloride: 109 mmol/L (ref 101–111)
GFR calc Af Amer: >60 mL/min (ref >60)
GFR, EST NON AFRICAN AMERICAN: 55 mL/min — AB (ref >60)
Glucose, Bld: 84 mg/dL (ref 65–99)
Potassium: 3.8 mmol/L (ref 3.5–5.1)
Sodium: 140 mmol/L (ref 135–145)
TOTAL PROTEIN: 7.2 g/dL (ref 6.5–8.1)

## 2017-11-26 LAB — CBC WITH DIFFERENTIAL/PLATELET
BASOS PCT: 0 %
Basophils Absolute: 0 10*3/uL (ref 0.0–0.1)
EOS ABS: 0.4 10*3/uL (ref 0.0–0.7)
EOS PCT: 7 %
HCT: 34.1 % — ABNORMAL LOW (ref 36.0–46.0)
Hemoglobin: 10.5 g/dL — ABNORMAL LOW (ref 12.0–15.0)
Lymphocytes Relative: 25 %
Lymphs Abs: 1.5 10*3/uL (ref 0.7–4.0)
MCH: 27.1 pg (ref 26.0–34.0)
MCHC: 30.8 g/dL (ref 30.0–36.0)
MCV: 87.9 fL (ref 78.0–100.0)
MONO ABS: 0.6 10*3/uL (ref 0.1–1.0)
MONOS PCT: 10 %
NEUTROS PCT: 58 %
Neutro Abs: 3.4 10*3/uL (ref 1.7–7.7)
Platelets: 290 10*3/uL (ref 150–400)
RBC: 3.88 MIL/uL (ref 3.87–5.11)
RDW: 16.4 % — ABNORMAL HIGH (ref 11.5–15.5)
WBC: 5.9 10*3/uL (ref 4.0–10.5)

## 2017-11-26 NOTE — Progress Notes (Signed)
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 female returns for iron deficiency anemia and MGUS. She states overall she has been doing well.  She feels fatigued.  She states her appetite is fair.  She occasionally has some trouble swallowing some solids.  She also has chronic pain in her left shoulder and left knee.  She denies any bleeding including melena, hematochezia, hematuria.   Patient Active Problem List   Diagnosis Date Noted  . Monoclonal gammopathy of unknown significance (MGUS) 11/26/2017  . Memory loss of unknown cause 11/10/2017  . Chronic left shoulder pain 09/14/2017  . Encounter for medication management 08/10/2017  . Encounter for chronic pain management 02/09/2017  . Carpopedal spasm 01/18/2017  . Anemia in chronic renal disease 12/30/2015  . Morbid obesity ([place]) 04/18/2015  . Myasthenia gravis in remission ([place]) 11/24/2014  . Myasthenia gravis ([place]) 11/16/2014  . Vitamin D deficiency 05/24/2014  . Osteopenia 04/26/2014  . Macular hole of left eye 01/19/2014  . Generalized osteoarthritis 05/25/2013  . Esophageal dysphagia 04/03/2012  . Abnormal chest CT 03/02/2012  . Insomnia 01/02/2012  . Essential hypertension, benign 05/30/2011  . Schatzki's ring 05/07/2011  . INGROWN TOENAIL 01/10/2011  . GLAUCOMA 08/08/2010  . DYSCHROMIA, UNSPECIFIED 08/08/2010  . Chronic renal disease, stage 3, moderately decreased glomerular filtration rate (GFR) between 30-59 mL/min/1.73 square meter (HCC) 09/14/2009  . HIP PAIN, LEFT 06/09/2009  . Urinary incontinence 06/09/2009  . Iron deficiency anemia 10/02/2008  . Hypothyroidism 09/27/2008  . Hyperlipemia 06/30/2008  . Depression with anxiety 06/30/2008  . GERD 06/30/2008  . Obstructive sleep  apnea 06/30/2008  . CARPAL TUNNEL SYNDROME 05/19/2008  . SHOULDER PAIN 02/02/2008  . IMPINGEMENT SYNDROME 02/02/2008  . DEGENERATIVE JOINT DISEASE, KNEE 11/24/2007  . Diabetes mellitus, insulin dependent (IDDM), controlled ([place]) 09/09/2007    has No Known Allergies.  MEDICAL HISTORY: Past Medical History:  Diagnosis Date  . Anemia in chronic renal disease 12/30/2015  . Anxiety   . Arthritis    "all over" (01/19/2014)  . Carpal tunnel syndrome   . Chronic bronchitis ([place])    "got it q yr for awhile; hasn't had it in awhile" (01/19/2014)  . Chronic kidney disease (CKD) stage G3b/A1, moderately decreased glomerular filtration rate (GFR) between 30-44 mL/min/1.73 square meter and albuminuria creatinine ratio less than 30 mg/g (HCC) 09/14/2009   Qualifier: Diagnosis of  By: Moshe Cipro MD, Joycelyn Schmid    . Chronic renal disease, stage 3, moderately decreased glomerular filtration rate (GFR) between 30-59 mL/min/1.73 square meter (HCC) 09/14/2009   Qualifier: Diagnosis of  By: Moshe Cipro MD, Joycelyn Schmid    . Depression   . Diverticulosis 07/2004   Colonscopy Dr Gala Romney  . Esophagitis, erosive 2009  . Exertional shortness of breath   . Gastroesophageal reflux   . RXVQMGQQ(761.9)    "usually a couple times/wk" (01/19/2014)  . Heart murmur    saw cardiology In [place], he told her she did not need to come back.  . Hyperlipidemia   . Hypertension   . Impingement syndrome, shoulder   . Iron deficiency anemia   . Mitral regurgitation   . Myasthenia gravis   . Myasthenia gravis in remission ([place]) 11/24/2014  . Obesity   . OSA on CPAP   . Pneumonia 01/2012  . Pulmonary  HTN ([place])   . Schatzki's ring    Last EGD w/ dilation 02/08/11, 2009 & 2007  . Seasonal allergies   . Shoulder pain   . Thyroid disease    "used to take RX; they took me off it" (01/19/2014)  . Tobacco abuse   . Type II diabetes mellitus ([place])     SURGICAL HISTORY: Past Surgical History:  Procedure Laterality Date  . [place] VITRECTOMY WITH 20 GAUGE MVR PORT Left 01/19/2014   Procedure: 25 GAUGE PARS PLANA VITRECTOMY WITH 20 GAUGE MVR PORT; MEMBRAME PEEL; SERUM PATCH; LASER TREATMENT; C3F8;  Surgeon: Hayden Pedro, MD;  Location: [place];  Service: Ophthalmology;  Laterality: Left;  . ABDOMINAL HYSTERECTOMY    . APPENDECTOMY    . CARPAL TUNNEL RELEASE Bilateral   . CATARACT EXTRACTION W/PHACO Left 10/21/2013   Procedure: LEFT CATARACT EXTRACTION PHACO AND INTRAOCULAR LENS PLACEMENT (IOC);  Surgeon: Marylynn Pearson, MD;  Location: [facility];  Service: Ophthalmology;  Laterality: Left;  . CHOLECYSTECTOMY    . COLONOSCOPY  05/08/2012   IWO:EHOZYYQM and external hemorrhoids; colonic diverticulosis  . ESOPHAGEAL DILATION     "more than 3 times" (01/19/2014)  . ESOPHAGOGASTRODUODENOSCOPY  02/08/11   [place]-Distal esophageal erosion consistent with mild erosive reflux   esophagitis/ Noncritical Schatzki ring, small hiatal hernia otherwise upper/ gastrointestinal tract appeared unremarkable, status post passage  of a Maloney dilation to biopsy disruption of the ring described  . ESOPHAGOGASTRODUODENOSCOPY  04/2012   2 tandem incomplete distal esophagea rings s/p dilation.   . ESOPHAGOGASTRODUODENOSCOPY N/A 09/16/2014   Procedure: ESOPHAGOGASTRODUODENOSCOPY (EGD);  Surgeon: Daneil Dolin, MD;  [facility];  Service: Endoscopy;  Laterality: N/A;  1200  . EYE SURGERY    . INCONTINENCE SURGERY  08/26/09   [place]  . JOINT REPLACEMENT     right total knee  . MALONEY DILATION N/A 09/16/2014   Procedure: Venia Minks DILATION;  Surgeon: Daneil Dolin, MD;  [facility];  Service: Endoscopy;  Laterality: N/A;  . PARS PLANA VITRECTOMY W/ REPAIR OF MACULAR HOLE Left 01/19/2014  . SAVORY DILATION N/A 09/16/2014   Procedure: SAVORY DILATION;  Surgeon: Daneil Dolin, MD;  [facility];  Service: Endoscopy;  Laterality: N/A;  . TONSILLECTOMY    . TOTAL KNEE ARTHROPLASTY Right 05/13/07   Dr. Aline Brochure     SOCIAL HISTORY: Social History   Socioeconomic History  . Marital status: Single    Spouse name: Not on file  . Number of children: 2  . Years of education: Not on file  . Highest education level: Not on file  Social Needs  . Financial resource strain: Not on file  . Food insecurity - worry: Not on file  . Food insecurity - inability: Not on file  . Transportation needs - medical: Not on file  . Transportation needs - non-medical: Not on file  Occupational History  . Occupation: disabled    Employer: RETIRED  Tobacco Use  . Smoking status: Former Smoker    Packs/day: 0.50    Years: 25.00    Pack years: 12.50    Types: Cigarettes    Last attempt to quit: 01/01/2004    Years since quitting: 13.9  . Smokeless tobacco: Never Used  Substance and Sexual Activity  . Alcohol use: No  . Drug use: No  . Sexual activity: No    Birth control/protection: Surgical  Other Topics Concern  . Not on file  Social History Narrative  Patient lives at home by herself   Patient drinks one cup of coffee a day   Patient is right handed.    FAMILY HISTORY: Family History  Problem Relation Age of Onset  . Cancer Mother   . Heart disease Mother   . Cancer Father   . Heart disease Father   . Diabetes Sister   . Hypertension Brother   . Heart disease Brother   . Cancer Sister   . Kidney failure Sister   . Diabetes Son   . Hypertension Son   . Hypertension Daughter   . Colon cancer Neg Hx     Review of Systems - Oncology  ROS as per HPI otherwise 12 point ROS is negative.   PHYSICAL EXAMINATION   Vitals:   11/26/17 1318  BP: (!) 147/50  Pulse: (!) 48  Resp: 16  Temp: 98.8 F (37.1 C)  SpO2: 99%    Physical Exam Constitutional: Well-developed, well-nourished, and in no distress.   HENT:  Head: Normocephalic and atraumatic.  Mouth/Throat: No oropharyngeal exudate. Mucosa moist. Eyes: Pupils are equal, round, and reactive to light. Conjunctivae are normal. No  scleral icterus.  Neck: Normal range of motion. Neck supple. No JVD present.  Cardiovascular: Normal rate, regular rhythm and normal heart sounds.  Exam reveals no gallop and no friction rub.   No murmur heard. Pulmonary/Chest: Effort normal and breath sounds normal. No respiratory distress. No wheezes.No rales.  Abdominal: Soft. Bowel sounds are normal. No distension. There is no tenderness. There is no guarding.  Musculoskeletal: No edema or tenderness.  Lymphadenopathy:    No cervical or supraclavicular adenopathy.  Neurological: Alert and oriented to person, place, and time. No cranial nerve deficit.  Skin: Skin is warm and dry. No rash noted. No erythema. No pallor.  Psychiatric: Affect and judgment normal.    LABORATORY DATA:  CBC    Component Value Date/Time   WBC 5.9 11/26/2017 1235   RBC 3.88 11/26/2017 1235   HGB 10.5 (L) 11/26/2017 1235   HCT 34.1 (L) 11/26/2017 1235   PLT 290 11/26/2017 1235   MCV 87.9 11/26/2017 1235   MCH 27.1 11/26/2017 1235   MCHC 30.8 11/26/2017 1235   RDW 16.4 (H) 11/26/2017 1235   LYMPHSABS 1.5 11/26/2017 1235   MONOABS 0.6 11/26/2017 1235   EOSABS 0.4 11/26/2017 1235   BASOSABS 0.0 11/26/2017 1235    CMP     Component Value Date/Time   NA 140 11/26/2017 1235   K 3.8 11/26/2017 1235   CL 109 11/26/2017 1235   CO2 24 11/26/2017 1235   GLUCOSE 84 11/26/2017 1235   BUN 20 11/26/2017 1235   CREATININE 0.99 11/26/2017 1235   CREATININE 0.87 09/13/2017 1518   CALCIUM 9.2 11/26/2017 1235   PROT 7.2 11/26/2017 1235   ALBUMIN 3.7 11/26/2017 1235   AST 16 11/26/2017 1235   ALT 11 (L) 11/26/2017 1235   ALKPHOS 130 (H) 11/26/2017 1235   BILITOT 0.4 11/26/2017 1235   GFRNONAA 55 (L) 11/26/2017 1235   GFRNONAA 66 09/13/2017 1518   GFRAA >60 11/26/2017 1235   GFRAA 76 09/13/2017 1518     ASSESSMENT and THERAPY PLAN:  1. Chronic normocytic anemia with iron deficiency   -hemoglobin stable today in the 10 g/dL range.  -continue daily  oral iron supplement.' -awaiting her iron studies to come back.  2. IgA kappa monoclonal gammopathy -patient's last 2 SPEPs have been negative for a monoclonal gammopathy, however immunofixation demonstrated an IgA kappa monoclonal  gammopathy. -discussed bone marrow biopsy with the patient in the past but she declined. -continue observation at this time of her labs.  RTC in 4 months with labs 1 week prior.  Orders Placed This Encounter  Procedures  . CBC with Differential    Standing Status:   Future    Standing Expiration Date:   11/26/2018  . Comprehensive metabolic panel    Standing Status:   Future    Standing Expiration Date:   11/26/2018  . Kappa/lambda light chains    Standing Status:   Future    Standing Expiration Date:   11/26/2018  . Protein electrophoresis, serum    Standing Status:   Future    Standing Expiration Date:   11/26/2018  . Beta 2 microglobuline, serum    Standing Status:   Future    Standing Expiration Date:   05/27/2019  . Ferritin    Standing Status:   Future    Standing Expiration Date:   11/26/2018  . Iron and TIBC    Standing Status:   Future    Standing Expiration Date:   11/26/2018    All questions were answered. The patient knows to call the clinic with any problems, questions or concerns. We can certainly see the patient much sooner if necessary.  This note was electronically signed. Twana First, MD 11/26/2017

## 2017-11-26 NOTE — Patient Instructions (Signed)
[facility] Discharge Instructions  RECOMMENDATIONS MADE BY THE CONSULTANT AND ANY TEST RESULTS WILL BE SENT TO YOUR REFERRING PHYSICIAN.  You were seen today by Dr. Louise Zhou    Thank you for choosing [facility] at [facility] to provide your oncology and hematology care.  To afford each patient quality time with our provider, please arrive at least 15 minutes before your scheduled appointment time.    If you have a lab appointment with the Cancer Center please come in thru the  Main Entrance and check in at the main information desk  You need to re-schedule your appointment should you arrive 10 or more minutes late.  We strive to give you quality time with our providers, and arriving late affects you and other patients whose appointments are after yours.  Also, if you no show three or more times for appointments you may be dismissed from the clinic at the providers discretion.     Again, thank you for choosing [facility].  Our hope is that these requests will decrease the amount of time that you wait before being seen by our physicians.       _____________________________________________________________  Should you have questions after your visit to  Cancer Center, please contact our office at [phone] between the hours of 8:30 a.m. and 4:30 p.m.  Voicemails left after 4:30 p.m. will not be returned until the following business day.  For prescription refill requests, have your pharmacy contact our office.       Resources For Cancer Patients and their Caregivers ? American Cancer Society: Can assist with transportation, wigs, general needs, runs Look Good Feel Better.        1-888-227-6333 ? Cancer Care: Provides financial assistance, online support groups, medication/co-pay assistance.  1-800-813-HOPE (4673) ? [facility] Assists [place] cancer patients and their  families through emotional , educational and financial support.  336-427-4357 ? [place] DSS Where to apply for food stamps, Medicaid and utility assistance. 336-342-1394 ? [facility]: Transportation to medical appointments. 336-347-2287 ? Social Security Administration: May apply for disability if have a Stage IV cancer. 336-342-7796 1-800-772-1213 ? [place] Aging, Disability and Transit Services: Assists with nutrition, care and transit needs. 336-349-2343  Cancer Center Support Programs: @10RELATIVEDAYS@ > Cancer Support Group  2nd Tuesday of the month 1pm-2pm, Journey Room  > Creative Journey  3rd Tuesday of the month 1130am-1pm, Journey Room  > Look Good Feel Better  1st Wednesday of the month 10am-12 noon, Journey Room (Call American Cancer Society to register 1-800-395-5775)    

## 2017-11-27 ENCOUNTER — Ambulatory Visit: Payer: Medicare Other | Admitting: Orthopedic Surgery

## 2017-11-27 ENCOUNTER — Encounter: Payer: Self-pay | Admitting: Orthopedic Surgery

## 2017-11-27 DIAGNOSIS — M75122 Complete rotator cuff tear or rupture of left shoulder, not specified as traumatic: Secondary | ICD-10-CM | POA: Diagnosis not present

## 2017-11-27 DIAGNOSIS — M7552 Bursitis of left shoulder: Secondary | ICD-10-CM | POA: Diagnosis not present

## 2017-11-27 DIAGNOSIS — M25512 Pain in left shoulder: Secondary | ICD-10-CM | POA: Diagnosis not present

## 2017-11-27 DIAGNOSIS — G8929 Other chronic pain: Secondary | ICD-10-CM

## 2017-11-27 LAB — IGG, IGA, IGM
IgA: 241 mg/dL (ref 64–422)
IgG (Immunoglobin G), Serum: 1293 mg/dL (ref 700–1600)
IgM (Immunoglobulin M), Srm: 127 mg/dL (ref 26–217)

## 2017-11-27 LAB — IMMUNOFIXATION ELECTROPHORESIS
IGA: 254 mg/dL (ref 64–422)
IGG (IMMUNOGLOBIN G), SERUM: 1253 mg/dL (ref 700–1600)
IGM (IMMUNOGLOBULIN M), SRM: 133 mg/dL (ref 26–217)
TOTAL PROTEIN ELP: 6.8 g/dL (ref 6.0–8.5)

## 2017-11-27 MED ORDER — DIAZEPAM 5 MG PO TABS: 5.0000 mg | ORAL_TABLET | Freq: Once | ORAL | 0 refills | Status: DC

## 2017-11-27 NOTE — Addendum Note (Signed)
Addended byCandice Camp on: 11/27/2017 04:08 PM   Modules accepted: Orders

## 2017-11-27 NOTE — Progress Notes (Addendum)
Chief complaint left shoulder pain  76 year old female presents again to the office with chronic left shoulder pain.  She had an injection she did home exercises.  She has not improved she complains of painful forward elevation weakness in her left shoulder  Initial history Chief complaint left shoulder pain   76 year old female presents for evaluation of her left shoulder   She began having left shoulder pain about 2 weeks ago and relates it to hitting her shoulder against a car door. Over the 2 week interval she has noticed decreased ability to raise her arm above her head with periarticular left shoulder plain including posterior and lateral deltoid which has become constant and is unrelieved by Tylenol or hydrocodone  Review of systems she complains of multiple joint aches and pains  She is on hydrocodone gabapentin it does not help her shoulder  Her examination reveals tenderness over the anterior shoulder and lateral deltoid with abduction and flexion less than 90 degrees.  Shoulder stable in abduction external rotation she has weakness in the rotator cuff her skin is intact pulses are good sensation is normal  Encounter Diagnoses  Name Primary?  . Bursitis of left shoulder   . Complete rotator cuff tear of left shoulder Yes    MRI left shoulder

## 2017-11-28 ENCOUNTER — Telehealth: Payer: Self-pay | Admitting: Radiology

## 2017-11-28 LAB — KAPPA/LAMBDA LIGHT CHAINS
Kappa free light chain: 30.5 mg/L — ABNORMAL HIGH (ref 3.3–19.4)
Kappa, lambda light chain ratio: 1.4 (ref 0.26–1.65)
Lambda free light chains: 21.8 mg/L (ref 5.7–26.3)

## 2017-11-28 LAB — PROTEIN ELECTROPHORESIS, SERUM
A/G Ratio: 0.9 (ref 0.7–1.7)
Albumin ELP: 3.1 g/dL (ref 2.9–4.4)
Alpha-1-Globulin: 0.3 g/dL (ref 0.0–0.4)
Alpha-2-Globulin: 0.8 g/dL (ref 0.4–1.0)
Beta Globulin: 1.2 g/dL (ref 0.7–1.3)
Gamma Globulin: 1.4 g/dL (ref 0.4–1.8)
Globulin, Total: 3.6 g/dL (ref 2.2–3.9)
Total Protein ELP: 6.7 g/dL (ref 6.0–8.5)

## 2017-11-28 NOTE — Telephone Encounter (Signed)
Called to sch MRI scan 3 pm on 12/04/17 arrive at 2:45  Made follow up also   Told her this information and she voiced understanding, but could not write down, said she would call back and get the information at a later date.

## 2017-12-02 ENCOUNTER — Telehealth: Payer: Self-pay | Admitting: Orthopedic Surgery

## 2017-12-02 DIAGNOSIS — G8929 Other chronic pain: Secondary | ICD-10-CM

## 2017-12-02 DIAGNOSIS — M25512 Pain in left shoulder: Principal | ICD-10-CM

## 2017-12-02 NOTE — Telephone Encounter (Signed)
yes

## 2017-12-02 NOTE — Telephone Encounter (Signed)
Patient does not want to go for the MRI scan, she would prefer to have physical therapy instead. I called to cancel the MRI, ok to send PT evaluate and treat orders for her shoulder? She prefers [facility]

## 2017-12-02 NOTE — Telephone Encounter (Signed)
Ms. Shipton wants to speak to you regarding the MRI that was set up for her.  Please call her at [phone]  Thanks

## 2017-12-02 NOTE — Telephone Encounter (Signed)
Put in referral for PT with Forestine Na for her shoulder called patient to advise.

## 2017-12-03 NOTE — Addendum Note (Signed)
Addended byCandice Camp on: 12/03/2017 11:52 AM   Modules accepted: Orders

## 2017-12-04 ENCOUNTER — Ambulatory Visit (HOSPITAL_COMMUNITY): Payer: Medicare Other | Attending: Orthopedic Surgery | Admitting: Occupational Therapy

## 2017-12-04 ENCOUNTER — Encounter (HOSPITAL_COMMUNITY): Payer: Self-pay | Admitting: Occupational Therapy

## 2017-12-04 ENCOUNTER — Other Ambulatory Visit: Payer: Self-pay

## 2017-12-04 ENCOUNTER — Ambulatory Visit (HOSPITAL_COMMUNITY): Payer: Medicare Other

## 2017-12-04 DIAGNOSIS — R29898 Other symptoms and signs involving the musculoskeletal system: Secondary | ICD-10-CM | POA: Diagnosis not present

## 2017-12-04 DIAGNOSIS — M25512 Pain in left shoulder: Secondary | ICD-10-CM | POA: Insufficient documentation

## 2017-12-04 NOTE — Patient Instructions (Signed)
SHOULDER: Flexion On Table   Place hands on table, elbows straight. Move hips away from body. Press hands down into table._15__ reps per set, _3__ sets per day  Abduction (Passive)   With arm out to side, resting on table, lower head toward arm, keeping trunk away from table. Repeat _15___ times. Do __3__ sessions per day.  Copyright  VHI. All rights reserved.     Internal Rotation (Assistive)   Seated with elbow bent at right angle and held against side, slide arm on table surface in an inward arc. Repeat __15__ times. Do _3___ sessions per day. Activity: Use this motion to brush crumbs off the table.  Copyright  VHI. All rights reserved.

## 2017-12-04 NOTE — Therapy (Signed)
[place], [place], [address] Phone: [phone]   Fax:  [phone]  Occupational Therapy Evaluation  Patient Details  Name: [name] MRN: [id number] Date of Birth: 02-13-1941 Referring Provider: Dr. Arther Abbott   Encounter Date: 12/04/2017  OT End of Session - 12/04/17 1432    Visit Number  1    Number of Visits  8    Date for OT Re-Evaluation  01/03/18    Authorization Type  [employer] Medicare-$40 copay    Authorization Time Period  Before 10th visit    Authorization - Visit Number  1    Authorization - Number of Visits  10    OT Start Time  1093    OT Stop Time  1426    OT Time Calculation (min)  28 min    Activity Tolerance  Patient tolerated treatment well    Behavior During Therapy  [facility] for tasks assessed/performed       Past Medical History:  Diagnosis Date  . Anemia in chronic renal disease 12/30/2015  . Anxiety   . Arthritis    "all over" (01/19/2014)  . Carpal tunnel syndrome   . Chronic bronchitis ([place])    "got it q yr for awhile; hasn't had it in awhile" (01/19/2014)  . Chronic kidney disease (CKD) stage G3b/A1, moderately decreased glomerular filtration rate (GFR) between 30-44 mL/min/1.73 square meter and albuminuria creatinine ratio less than 30 mg/g (HCC) 09/14/2009   Qualifier: Diagnosis of  By: Moshe Cipro MD, Joycelyn Schmid    . Chronic renal disease, stage 3, moderately decreased glomerular filtration rate (GFR) between 30-59 mL/min/1.73 square meter (HCC) 09/14/2009   Qualifier: Diagnosis of  By: Moshe Cipro MD, Joycelyn Schmid    . Depression   . Diverticulosis 07/2004   Colonscopy Dr Gala Romney  . Esophagitis, erosive 2009  . Exertional shortness of breath   . Gastroesophageal reflux   . ATFTDDUK(025.4)    "usually a couple times/wk" (01/19/2014)  . Heart murmur    saw cardiology In [place], he told her she did not need to come back.  . Hyperlipidemia   . Hypertension   . Impingement syndrome,  shoulder   . Iron deficiency anemia   . Mitral regurgitation   . Myasthenia gravis   . Myasthenia gravis in remission ([place]) 11/24/2014  . Obesity   . OSA on CPAP   . Pneumonia 01/2012  . Pulmonary HTN ([place])   . Schatzki's ring    Last EGD w/ dilation 02/08/11, 2009 & 2007  . Seasonal allergies   . Shoulder pain   . Thyroid disease    "used to take RX; they took me off it" (01/19/2014)  . Tobacco abuse   . Type II diabetes mellitus ([place])     Past Surgical History:  Procedure Laterality Date  . [place] VITRECTOMY WITH 20 GAUGE MVR PORT Left 01/19/2014   Procedure: 25 GAUGE PARS PLANA VITRECTOMY WITH 20 GAUGE MVR PORT; MEMBRAME PEEL; SERUM PATCH; LASER TREATMENT; C3F8;  Surgeon: Hayden Pedro, MD;  Location: [place];  Service: Ophthalmology;  Laterality: Left;  . ABDOMINAL HYSTERECTOMY    . APPENDECTOMY    . CARPAL TUNNEL RELEASE Bilateral   . CATARACT EXTRACTION W/PHACO Left 10/21/2013   Procedure: LEFT CATARACT EXTRACTION PHACO AND INTRAOCULAR LENS PLACEMENT (IOC);  Surgeon: Marylynn Pearson, MD;  Location: [place];  Service: Ophthalmology;  Laterality: Left;  . CHOLECYSTECTOMY    . COLONOSCOPY  05/08/2012   YHC:WCBJSEGB and  external hemorrhoids; colonic diverticulosis  . ESOPHAGEAL DILATION     "more than 3 times" (01/19/2014)  . ESOPHAGOGASTRODUODENOSCOPY  02/08/11   Rourk-Distal esophageal erosion consistent with mild erosive reflux   esophagitis/ Noncritical Schatzki ring, small hiatal hernia otherwise upper/ gastrointestinal tract appeared unremarkable, status post passage  of a Maloney dilation to biopsy disruption of the ring described  . ESOPHAGOGASTRODUODENOSCOPY  04/2012   2 tandem incomplete distal esophagea rings s/p dilation.   . ESOPHAGOGASTRODUODENOSCOPY N/A 09/16/2014   Procedure: ESOPHAGOGASTRODUODENOSCOPY (EGD);  Surgeon: Daneil Dolin, MD;  [facility];  Service: Endoscopy;  Laterality: N/A;  1200  . EYE SURGERY    . INCONTINENCE SURGERY  08/26/09    [place]baum  . JOINT REPLACEMENT     right total knee  . MALONEY DILATION N/A 09/16/2014   Procedure: Venia Minks DILATION;  Surgeon: Daneil Dolin, MD;  [facility];  Service: Endoscopy;  Laterality: N/A;  . [place] VITRECTOMY W/ REPAIR OF MACULAR HOLE Left 01/19/2014  . SAVORY DILATION N/A 09/16/2014   Procedure: SAVORY DILATION;  Surgeon: Daneil Dolin, MD;  [facility];  Service: Endoscopy;  Laterality: N/A;  . TONSILLECTOMY    . TOTAL KNEE ARTHROPLASTY Right 05/13/07   Dr. Aline Brochure    There were no vitals filed for this visit.  Subjective Assessment - 12/04/17 1428    Subjective   S: It's been hurting for a few months now.     Pertinent History  Pt is a 76 y/o female presenting with left shoulder pain present since approximately June. Pt believes pain began when she hit her arm on her car door. Pt received cortisone shot on 06/24/17 however reports it did not help. Dr. Arther Abbott referred pt to occupational therapy for evaluation and treatment.     Special Tests  FOTO Score: 28/100 (72% impairment)    Patient Stated Goals  To be able to lift and use my left arm.     Currently in Pain?  Yes    Pain Score  7     Pain Location  Shoulder    Pain Orientation  Left    Pain Descriptors / Indicators  Aching;Constant    Pain Type  Acute pain    Pain Radiating Towards  none    Pain Onset  More than a month ago    Pain Frequency  Constant    Aggravating Factors   movement, use    Pain Relieving Factors  rest, heat, pain cream    Effect of Pain on Daily Activities  mod effect on ADL completion    Multiple Pain Sites  No        [facility] OT Assessment - 12/04/17 1401      Assessment   Diagnosis  left shoulder pain    Referring Provider  Dr. Arther Abbott    Onset Date  06/10/17    Prior Therapy  None      Precautions   Precautions  None      Restrictions   Weight Bearing Restrictions  No      Balance Screen   Has the patient fallen in the past 6  months  No    Has the patient had a decrease in activity level because of a fear of falling?   No    Is the patient reluctant to leave their home because of a fear of falling?   No      Prior Function   Level  of Independence  Independent with basic ADLs    Vocation  Retired    Leisure  Oceanographer      ADL   ADL comments  Pt is having difficulty with dressing, grooming, bathing, reaching overhead and behind back, Pt is not able to shut car door, has difficulty sleeping      Written Expression   Dominant Hand  Right      Cognition   Overall Cognitive Status  Within Functional Limits for tasks assessed      ROM / Strength   AROM / PROM / Strength  AROM;PROM;Strength      Palpation   Palpation comment  Moderate fascial restrictions in left upper arm, trapezius, and scapularis regions      AROM   Overall AROM Comments  Assessed seated, er/IR adducted    AROM Assessment Site  Shoulder    Right/Left Shoulder  Left    Left Shoulder Flexion  82 Degrees    Left Shoulder ABduction  55 Degrees    Left Shoulder Internal Rotation  90 Degrees    Left Shoulder External Rotation  20 Degrees      PROM   Overall PROM Comments  Assessed supine, er/IR adducted    PROM Assessment Site  Shoulder    Right/Left Shoulder  Left    Left Shoulder Flexion  101 Degrees    Left Shoulder ABduction  71 Degrees    Left Shoulder Internal Rotation  90 Degrees    Left Shoulder External Rotation  25 Degrees      Strength   Overall Strength Comments  Assessed seated, er/IR adducted    Strength Assessment Site  Shoulder    Right/Left Shoulder  Left    Left Shoulder Flexion  3-/5    Left Shoulder ABduction  3-/5    Left Shoulder Internal Rotation  3-/5    Left Shoulder External Rotation  3-/5                      OT Education - 12/04/17 1416    Education provided  Yes    Education Details  table slides    Person(s) Educated  Patient    Methods  Explanation;Demonstration    Comprehension   Verbalized understanding;Returned demonstration       OT Short Term Goals - 12/04/17 1525      OT SHORT TERM GOAL #1   Title  Pt will be provided with and independent in HEP to improve LUE mobility required for daily task completion.     Time  4    Period  Weeks    Status  New    Target Date  01/03/18      OT SHORT TERM GOAL #2   Title  Pt will decrease pain in LUE to 3/10 or less to improve ability to sleep at night.     Time  4    Period  Weeks    Status  New      OT SHORT TERM GOAL #3   Title  Pt will increase LUE A/ROM to [facility] to improve ability to donn shirts and jackets.     Time  4    Period  Weeks    Status  New      OT SHORT TERM GOAL #4   Title  Pt will decrease LUE fascial restrictions from moderate to minimal amounts to improve mobility required for functional reaching tasks.     Time  4  Period  Weeks    Status  New      OT SHORT TERM GOAL #5   Title  Pt will improve LUE strength to 4/5 to improve ability to reach out and close car door.     Time  4    Period  Weeks    Status  New               Plan - 2017-12-18 1433    Clinical Impression Statement  A: Pt is a 76 y/o female presenting with left shoulder pain limiting functional task completion using LUE as non-dominant. Pt reports constant pain and decreased ROM and strength. Provided HEP for shoulder stretching this session. Pt is agreeable to come 2x/week and can reduce to 1x/week if necessary due to finances.     Occupational Profile and client history currently impacting functional performance  pt is independent at baseline and motivated to return to [facility]    Occupational performance deficits (Please refer to evaluation for details):  ADL's;IADL's;Rest and Sleep;Leisure;Social Participation    Rehab Potential  Good    OT Frequency  2x / week    OT Duration  4 weeks    OT Treatment/Interventions  Self-care/ADL training;Patient/family education;Passive range of motion;Cryotherapy;Electrical  Stimulation;Moist Heat;Therapeutic exercise;Manual Therapy;Therapeutic activities    Plan  P: Pt will benefit from skilled OT services to decrease pain and fascial restrictions, increase ROM, strength, and functional use of LUE during daily tasks. Treatment plan: myofascial release, manual therapy, P/ROM, AA/ROM, A/ROM, general LUE strengthening, scapular stability and strengthening, modalities prn    Clinical Decision Making  Limited treatment options, no task modification necessary    OT Home Exercise Plan  12/19/23: table slides    Consulted and Agree with Plan of Care  Patient       Patient will benefit from skilled therapeutic intervention in order to improve the following deficits and impairments:  Decreased strength, Decreased range of motion, Pain, Increased fascial restrictions, Impaired UE functional use  Visit Diagnosis: Acute pain of left shoulder  Other symptoms and signs involving the musculoskeletal system  G-Codes - 2017-12-18 1528    Functional Assessment Tool Used (Outpatient only)  FOTO Score: 28/100 (72% impairment)    Functional Limitation  Carrying, moving and handling objects    Carrying, Moving and Handling Objects Current Status (S9628)  At least 60 percent but less than 80 percent impaired, limited or restricted    Carrying, Moving and Handling Objects Goal Status (Z6629)  At least 40 percent but less than 60 percent impaired, limited or restricted       Problem List Patient Active Problem List   Diagnosis Date Noted  . Monoclonal gammopathy of unknown significance (MGUS) 11/26/2017  . Memory loss of unknown cause 11/10/2017  . Chronic left shoulder pain 09/14/2017  . Encounter for medication management 08/10/2017  . Encounter for chronic pain management 02/09/2017  . Carpopedal spasm 01/18/2017  . Anemia in chronic renal disease 12/30/2015  . Morbid obesity ([place]) 04/18/2015  . Myasthenia gravis in remission ([place]) 11/24/2014  . Myasthenia gravis ([place]) 11/16/2014   . Vitamin D deficiency 05/24/2014  . Osteopenia 04/26/2014  . Macular hole of left eye 01/19/2014  . Generalized osteoarthritis 05/25/2013  . Esophageal dysphagia 04/03/2012  . Abnormal chest CT 03/02/2012  . Insomnia 01/02/2012  . Essential hypertension, benign 05/30/2011  . Schatzki's ring 05/07/2011  . INGROWN TOENAIL 01/10/2011  . GLAUCOMA 08/08/2010  . DYSCHROMIA, UNSPECIFIED 08/08/2010  . Chronic renal disease, stage 3,  moderately decreased glomerular filtration rate (GFR) between 30-59 mL/min/1.73 square meter ([place]) 09/14/2009  . HIP PAIN, LEFT 06/09/2009  . Urinary incontinence 06/09/2009  . Iron deficiency anemia 10/02/2008  . Hypothyroidism 09/27/2008  . Hyperlipemia 06/30/2008  . Depression with anxiety 06/30/2008  . GERD 06/30/2008  . Obstructive sleep apnea 06/30/2008  . CARPAL TUNNEL SYNDROME 05/19/2008  . SHOULDER PAIN 02/02/2008  . IMPINGEMENT SYNDROME 02/02/2008  . DEGENERATIVE JOINT DISEASE, KNEE 11/24/2007  . Diabetes mellitus, insulin dependent (IDDM), controlled ([place]) 09/09/2007   Guadelupe Sabin, OTR/L  [phone] 12/04/2017, 3:30 PM  [place] [address] [place], [place], [address] Phone: [phone]   Fax:  [phone]  Name: [name] MRN: [id number] Date of Birth: 1941-12-07

## 2017-12-05 ENCOUNTER — Telehealth: Payer: Self-pay | Admitting: Family Medicine

## 2017-12-05 ENCOUNTER — Other Ambulatory Visit: Payer: Self-pay | Admitting: Family Medicine

## 2017-12-05 MED ORDER — PENICILLIN V POTASSIUM 500 MG PO TABS: 500.0000 mg | ORAL_TABLET | Freq: Three times a day (TID) | ORAL | 0 refills | Status: DC

## 2017-12-05 MED ORDER — BENZONATATE 100 MG PO CAPS: 100.0000 mg | ORAL_CAPSULE | Freq: Two times a day (BID) | ORAL | 0 refills | Status: DC | PRN

## 2017-12-05 NOTE — Telephone Encounter (Signed)
Tessalon perele and penicillin are sent top rX care

## 2017-12-05 NOTE — Telephone Encounter (Signed)
X 2 days, coughing up greenish mucus. Some chills, unsure if she had temp, no chest tightness. Declined appt, and wanted to know if something can be sent in. Please advise

## 2017-12-05 NOTE — Telephone Encounter (Signed)
Patient called in just wanting to let Dr.Simpson know that she is coughing up green fleam. I offered to make an appt and she declined because Dr.Simpson wouldn't be here tomorrow so she is requesting for a medication to be sent into her pharmacy. Cb#: [phone]

## 2017-12-05 NOTE — Telephone Encounter (Signed)
Pt aware.

## 2017-12-06 ENCOUNTER — Ambulatory Visit: Payer: Self-pay | Admitting: Orthopedic Surgery

## 2017-12-11 ENCOUNTER — Encounter (HOSPITAL_COMMUNITY): Payer: Self-pay | Admitting: Occupational Therapy

## 2017-12-11 ENCOUNTER — Other Ambulatory Visit: Payer: Self-pay

## 2017-12-11 ENCOUNTER — Ambulatory Visit (HOSPITAL_COMMUNITY): Payer: Medicare Other | Admitting: Occupational Therapy

## 2017-12-11 DIAGNOSIS — R29898 Other symptoms and signs involving the musculoskeletal system: Secondary | ICD-10-CM | POA: Diagnosis not present

## 2017-12-11 DIAGNOSIS — M25512 Pain in left shoulder: Secondary | ICD-10-CM

## 2017-12-11 NOTE — Therapy (Signed)
[place], [place], [address] Phone: [phone]   Fax:  [phone]  Occupational Therapy Treatment  Patient Details  Name: [name] MRN: [id number] Date of Birth: 07-25-1941 Referring Provider (Historical): Dr. Arther Abbott   Encounter Date: 12/11/2017  OT End of Session - 12/11/17 1336    Visit Number  2    Number of Visits  8    Date for OT Re-Evaluation  01/03/18    Authorization Type  [employer] Medicare-$40 copay    Authorization Time Period  Before 10th visit    Authorization - Visit Number  2    Authorization - Number of Visits  10    OT Start Time  1660    OT Stop Time  1342    OT Time Calculation (min)  44 min    Activity Tolerance  Patient tolerated treatment well    Behavior During Therapy  [facility] for tasks assessed/performed       Past Medical History:  Diagnosis Date  . Anemia in chronic renal disease 12/30/2015  . Anxiety   . Arthritis    "all over" (01/19/2014)  . Carpal tunnel syndrome   . Chronic bronchitis ([place])    "got it q yr for awhile; hasn't had it in awhile" (01/19/2014)  . Chronic kidney disease (CKD) stage G3b/A1, moderately decreased glomerular filtration rate (GFR) between 30-44 mL/min/1.73 square meter and albuminuria creatinine ratio less than 30 mg/g (HCC) 09/14/2009   Qualifier: Diagnosis of  By: Moshe Cipro MD, Joycelyn Schmid    . Chronic renal disease, stage 3, moderately decreased glomerular filtration rate (GFR) between 30-59 mL/min/1.73 square meter (HCC) 09/14/2009   Qualifier: Diagnosis of  By: Moshe Cipro MD, Joycelyn Schmid    . Depression   . Diverticulosis 07/2004   Colonscopy Dr Gala Romney  . Esophagitis, erosive 2009  . Exertional shortness of breath   . Gastroesophageal reflux   . YTKZSWFU(932.3)    "usually a couple times/wk" (01/19/2014)  . Heart murmur    saw cardiology In [place], he told her she did not need to come back.  . Hyperlipidemia   . Hypertension   . Impingement  syndrome, shoulder   . Iron deficiency anemia   . Mitral regurgitation   . Myasthenia gravis   . Myasthenia gravis in remission ([place]) 11/24/2014  . Obesity   . OSA on CPAP   . Pneumonia 01/2012  . Pulmonary HTN ([place])   . Schatzki's ring    Last EGD w/ dilation 02/08/11, 2009 & 2007  . Seasonal allergies   . Shoulder pain   . Thyroid disease    "used to take RX; they took me off it" (01/19/2014)  . Tobacco abuse   . Type II diabetes mellitus ([place])     Past Surgical History:  Procedure Laterality Date  . [place] VITRECTOMY WITH 20 GAUGE MVR PORT Left 01/19/2014   Procedure: 25 GAUGE PARS PLANA VITRECTOMY WITH 20 GAUGE MVR PORT; MEMBRAME PEEL; SERUM PATCH; LASER TREATMENT; C3F8;  Surgeon: Hayden Pedro, MD;  Location: [facility];  Service: Ophthalmology;  Laterality: Left;  . ABDOMINAL HYSTERECTOMY    . APPENDECTOMY    . CARPAL TUNNEL RELEASE Bilateral   . CATARACT EXTRACTION W/PHACO Left 10/21/2013   Procedure: LEFT CATARACT EXTRACTION PHACO AND INTRAOCULAR LENS PLACEMENT (IOC);  Surgeon: Marylynn Pearson, MD;  [facility];  Service: Ophthalmology;  Laterality: Left;  . CHOLECYSTECTOMY    . COLONOSCOPY  05/08/2012   FTD:DUKGURKY  and external hemorrhoids; colonic diverticulosis  . ESOPHAGEAL DILATION     "more than 3 times" (01/19/2014)  . ESOPHAGOGASTRODUODENOSCOPY  02/08/11   Rourk-Distal esophageal erosion consistent with mild erosive reflux   esophagitis/ Noncritical Schatzki ring, small hiatal hernia otherwise upper/ gastrointestinal tract appeared unremarkable, status post passage  of a Maloney dilation to biopsy disruption of the ring described  . ESOPHAGOGASTRODUODENOSCOPY  04/2012   2 tandem incomplete distal esophagea rings s/p dilation.   . ESOPHAGOGASTRODUODENOSCOPY N/A 09/16/2014   Procedure: ESOPHAGOGASTRODUODENOSCOPY (EGD);  Surgeon: Daneil Dolin, MD;  [facility];  Service: Endoscopy;  Laterality: N/A;  1200  . EYE SURGERY    . INCONTINENCE SURGERY   08/26/09   [place]baum  . JOINT REPLACEMENT     right total knee  . MALONEY DILATION N/A 09/16/2014   Procedure: Venia Minks DILATION;  Surgeon: Daneil Dolin, MD;  [facility];  Service: Endoscopy;  Laterality: N/A;  . [place] VITRECTOMY W/ REPAIR OF MACULAR HOLE Left 01/19/2014  . SAVORY DILATION N/A 09/16/2014   Procedure: SAVORY DILATION;  Surgeon: Daneil Dolin, MD;  [facility];  Service: Endoscopy;  Laterality: N/A;  . TONSILLECTOMY    . TOTAL KNEE ARTHROPLASTY Right 05/13/07   Dr. Aline Brochure    There were no vitals filed for this visit.  Subjective Assessment - 12/11/17 1255    Subjective   S: Those exercises made my pain a little worse I think.     Currently in Pain?  Yes    Pain Score  7     Pain Location  Shoulder    Pain Orientation  Left    Pain Descriptors / Indicators  Aching;Constant    Pain Type  Acute pain    Pain Radiating Towards  none    Pain Onset  More than a month ago    Pain Frequency  Constant    Aggravating Factors   movement, use    Pain Relieving Factors  rest, heat, pain cream    Effect of Pain on Daily Activities  mod effect on ADL completion    Multiple Pain Sites  No         [facility] OT Assessment - 12/11/17 1255      Assessment   Medical Diagnosis  Left shoulder pain      Precautions   Precautions  None               OT Treatments/Exercises (OP) - 12/11/17 1302      Exercises   Exercises  Shoulder      Shoulder Exercises: Supine   Protraction  PROM;5 reps;AAROM;10 reps    Horizontal ABduction  PROM;5 reps;AAROM;10 reps    External Rotation  PROM;5 reps;AAROM;10 reps    Internal Rotation  PROM;5 reps;AAROM;10 reps    Flexion  PROM;5 reps;AAROM;10 reps    ABduction  PROM;AAROM;5 reps    ABduction Limitations  Verbal and tactile facilitation for correct form and completion      Shoulder Exercises: Seated   Elevation  AROM;10 reps    Extension  AROM;10 reps    Row  AROM;10 reps      Shoulder  Exercises: Therapy Ball   Flexion  10 reps    ABduction  10 reps      Manual Therapy   Manual Therapy  Myofascial release    Manual therapy comments  Completed separately from therapeutic exercises    Myofascial Release  Myofascial release to left upper  arm, trapezius, and scapularis regions to decrease pain and fascial restrictions and increase joint range of motion.              OT Education - 12/11/17 1334    Education provided  Yes    Education Details  provided evaluation and reviewed goals    Person(s) Educated  Patient    Methods  Explanation;Demonstration;Handout    Comprehension  Verbalized understanding;Returned demonstration       OT Short Term Goals - 12/11/17 1335      OT SHORT TERM GOAL #1   Title  Pt will be provided with and independent in HEP to improve LUE mobility required for daily task completion.     Time  4    Period  Weeks    Status  On-going      OT SHORT TERM GOAL #2   Title  Pt will decrease pain in LUE to 3/10 or less to improve ability to sleep at night.     Time  4    Period  Weeks    Status  On-going      OT SHORT TERM GOAL #3   Title  Pt will increase LUE A/ROM to [facility] to improve ability to donn shirts and jackets.     Time  4    Period  Weeks    Status  On-going      OT SHORT TERM GOAL #4   Title  Pt will decrease LUE fascial restrictions from moderate to minimal amounts to improve mobility required for functional reaching tasks.     Time  4    Period  Weeks    Status  On-going      OT SHORT TERM GOAL #5   Title  Pt will improve LUE strength to 4/5 to improve ability to reach out and close car door.     Time  4    Period  Weeks    Status  On-going               Plan - 12/11/17 1336    Clinical Impression Statement  A: Initiated myofascial release, manual therapy, P/ROM, AA/ROM, scapular A/ROM, and therapy ball exercises. Verbal cuing for form and technique throughout session, occasional rest breaks due to fatigue. Pt  with approximately 50% range of motion both passively and actively today. Provided evaluation and reviewed.     Plan  P: Add thumb tacks and wall wash, update HEP for AA/ROM in supine if pt able to complete with good form    Consulted and Agree with Plan of Care  Patient       Patient will benefit from skilled therapeutic intervention in order to improve the following deficits and impairments:  Decreased strength, Decreased range of motion, Pain, Increased fascial restrictions, Impaired UE functional use  Visit Diagnosis: Acute pain of left shoulder  Other symptoms and signs involving the musculoskeletal system    Problem List Patient Active Problem List   Diagnosis Date Noted  . Monoclonal gammopathy of unknown significance (MGUS) 11/26/2017  . Memory loss of unknown cause 11/10/2017  . Chronic left shoulder pain 09/14/2017  . Encounter for medication management 08/10/2017  . Encounter for chronic pain management 02/09/2017  . Carpopedal spasm 01/18/2017  . Anemia in chronic renal disease 12/30/2015  . Morbid obesity ([place]) 04/18/2015  . Myasthenia gravis in remission ([place]) 11/24/2014  . Myasthenia gravis ([place]) 11/16/2014  . Vitamin D deficiency 05/24/2014  . Osteopenia 04/26/2014  . Macular  hole of left eye 01/19/2014  . Generalized osteoarthritis 05/25/2013  . Esophageal dysphagia 04/03/2012  . Abnormal chest CT 03/02/2012  . Insomnia 01/02/2012  . Essential hypertension, benign 05/30/2011  . Schatzki's ring 05/07/2011  . INGROWN TOENAIL 01/10/2011  . GLAUCOMA 08/08/2010  . DYSCHROMIA, UNSPECIFIED 08/08/2010  . Chronic renal disease, stage 3, moderately decreased glomerular filtration rate (GFR) between 30-59 mL/min/1.73 square meter (HCC) 09/14/2009  . HIP PAIN, LEFT 06/09/2009  . Urinary incontinence 06/09/2009  . Iron deficiency anemia 10/02/2008  . Hypothyroidism 09/27/2008  . Hyperlipemia 06/30/2008  . Depression with anxiety 06/30/2008  . GERD 06/30/2008  .  Obstructive sleep apnea 06/30/2008  . CARPAL TUNNEL SYNDROME 05/19/2008  . SHOULDER PAIN 02/02/2008  . IMPINGEMENT SYNDROME 02/02/2008  . DEGENERATIVE JOINT DISEASE, KNEE 11/24/2007  . Diabetes mellitus, insulin dependent (IDDM), controlled ([place]) 09/09/2007   Guadelupe Sabin, OTR/L  [phone] 12/11/2017, 1:52 PM  [place] [address] [place], [place], [address] Phone: [phone]   Fax:  [phone]  Name: EMILLIE CHASEN MRN: [id number] Date of Birth: 21-Apr-1941

## 2017-12-13 ENCOUNTER — Encounter (HOSPITAL_COMMUNITY): Payer: Self-pay | Admitting: Occupational Therapy

## 2017-12-13 ENCOUNTER — Other Ambulatory Visit: Payer: Self-pay | Admitting: Family Medicine

## 2017-12-13 ENCOUNTER — Other Ambulatory Visit: Payer: Self-pay

## 2017-12-13 ENCOUNTER — Ambulatory Visit (HOSPITAL_COMMUNITY): Payer: Medicare Other | Admitting: Occupational Therapy

## 2017-12-13 DIAGNOSIS — R29898 Other symptoms and signs involving the musculoskeletal system: Secondary | ICD-10-CM | POA: Diagnosis not present

## 2017-12-13 DIAGNOSIS — M25512 Pain in left shoulder: Secondary | ICD-10-CM

## 2017-12-13 NOTE — Therapy (Signed)
[place], [place], [address] Phone: [phone]   Fax:  [phone]  Occupational Therapy Treatment  Patient Details  Name: [name] MRN: [id number] Date of Birth: March 31, 1941 Referring Provider (Historical): Dr. Arther Abbott   Encounter Date: 12/13/2017  OT End of Session - 12/13/17 1348    Visit Number  3    Number of Visits  8    Date for OT Re-Evaluation  01/03/18    Authorization Type  [employer] Medicare-$40 copay    Authorization Time Period  Before 10th visit    Authorization - Visit Number  3    Authorization - Number of Visits  10    OT Start Time  6962    OT Stop Time  1346    OT Time Calculation (min)  43 min    Activity Tolerance  Patient tolerated treatment well    Behavior During Therapy  [facility] for tasks assessed/performed       Past Medical History:  Diagnosis Date  . Anemia in chronic renal disease 12/30/2015  . Anxiety   . Arthritis    "all over" (01/19/2014)  . Carpal tunnel syndrome   . Chronic bronchitis ([place])    "got it q yr for awhile; hasn't had it in awhile" (01/19/2014)  . Chronic kidney disease (CKD) stage G3b/A1, moderately decreased glomerular filtration rate (GFR) between 30-44 mL/min/1.73 square meter and albuminuria creatinine ratio less than 30 mg/g (HCC) 09/14/2009   Qualifier: Diagnosis of  By: Moshe Cipro MD, Joycelyn Schmid    . Chronic renal disease, stage 3, moderately decreased glomerular filtration rate (GFR) between 30-59 mL/min/1.73 square meter (HCC) 09/14/2009   Qualifier: Diagnosis of  By: Moshe Cipro MD, Joycelyn Schmid    . Depression   . Diverticulosis 07/2004   Colonscopy Dr Gala Romney  . Esophagitis, erosive 2009  . Exertional shortness of breath   . Gastroesophageal reflux   . XBMWUXLK(440.1)    "usually a couple times/wk" (01/19/2014)  . Heart murmur    saw cardiology In [place], he told her she did not need to come back.  . Hyperlipidemia   . Hypertension   . Impingement  syndrome, shoulder   . Iron deficiency anemia   . Mitral regurgitation   . Myasthenia gravis   . Myasthenia gravis in remission ([place]) 11/24/2014  . Obesity   . OSA on CPAP   . Pneumonia 01/2012  . Pulmonary HTN ([place])   . Schatzki's ring    Last EGD w/ dilation 02/08/11, 2009 & 2007  . Seasonal allergies   . Shoulder pain   . Thyroid disease    "used to take RX; they took me off it" (01/19/2014)  . Tobacco abuse   . Type II diabetes mellitus ([place])     Past Surgical History:  Procedure Laterality Date  . [place] VITRECTOMY WITH 20 GAUGE MVR PORT Left 01/19/2014   Procedure: 25 GAUGE PARS PLANA VITRECTOMY WITH 20 GAUGE MVR PORT; MEMBRAME PEEL; SERUM PATCH; LASER TREATMENT; C3F8;  Surgeon: Hayden Pedro, MD;  Location: [place];  Service: Ophthalmology;  Laterality: Left;  . ABDOMINAL HYSTERECTOMY    . APPENDECTOMY    . CARPAL TUNNEL RELEASE Bilateral   . CATARACT EXTRACTION W/PHACO Left 10/21/2013   Procedure: LEFT CATARACT EXTRACTION PHACO AND INTRAOCULAR LENS PLACEMENT (IOC);  Surgeon: Marylynn Pearson, MD;  Location: [place];  Service: Ophthalmology;  Laterality: Left;  . CHOLECYSTECTOMY    . COLONOSCOPY  05/08/2012   [id number]  and external hemorrhoids; colonic diverticulosis  . ESOPHAGEAL DILATION     "more than 3 times" (01/19/2014)  . ESOPHAGOGASTRODUODENOSCOPY  02/08/11   Rourk-Distal esophageal erosion consistent with mild erosive reflux   esophagitis/ Noncritical Schatzki ring, small hiatal hernia otherwise upper/ gastrointestinal tract appeared unremarkable, status post passage  of a Maloney dilation to biopsy disruption of the ring described  . ESOPHAGOGASTRODUODENOSCOPY  04/2012   2 tandem incomplete distal esophagea rings s/p dilation.   . ESOPHAGOGASTRODUODENOSCOPY N/A 09/16/2014   Procedure: ESOPHAGOGASTRODUODENOSCOPY (EGD);  Surgeon: Daneil Dolin, MD;  [facility];  Service: Endoscopy;  Laterality: N/A;  1200  . EYE SURGERY    . INCONTINENCE SURGERY   08/26/09   Tananbaum  . JOINT REPLACEMENT     right total knee  . MALONEY DILATION N/A 09/16/2014   Procedure: Venia Minks DILATION;  Surgeon: Daneil Dolin, MD;  [facility];  Service: Endoscopy;  Laterality: N/A;  . PARS PLANA VITRECTOMY W/ REPAIR OF MACULAR HOLE Left 01/19/2014  . SAVORY DILATION N/A 09/16/2014   Procedure: SAVORY DILATION;  Surgeon: Daneil Dolin, MD;  [facility];  Service: Endoscopy;  Laterality: N/A;  . TONSILLECTOMY    . TOTAL KNEE ARTHROPLASTY Right 05/13/07   Dr. Aline Brochure    There were no vitals filed for this visit.  Subjective Assessment - 12/13/17 1302    Subjective   S: I've been doing those exercises the best I can.     Currently in Pain?  Yes    Pain Score  7     Pain Location  Shoulder    Pain Orientation  Left    Pain Descriptors / Indicators  Aching;Constant    Pain Type  Acute pain    Pain Radiating Towards  none    Pain Onset  More than a month ago    Pain Frequency  Constant    Aggravating Factors   movement, use    Pain Relieving Factors  rest, heat, pain cream    Effect of Pain on Daily Activities  mod effect on ADL completion    Multiple Pain Sites  No         [facility] OT Assessment - 12/13/17 1302      Assessment   Medical Diagnosis  Left shoulder pain      Precautions   Precautions  None               OT Treatments/Exercises (OP) - 12/13/17 1305      Exercises   Exercises  Shoulder      Shoulder Exercises: Supine   Protraction  PROM;5 reps;AAROM;10 reps    Horizontal ABduction  PROM;5 reps;AAROM;10 reps    External Rotation  PROM;5 reps;AAROM;10 reps    Internal Rotation  PROM;5 reps;AAROM;10 reps    Flexion  PROM;5 reps;AAROM;10 reps    ABduction  PROM;AAROM;5 reps    ABduction Limitations  Verbal cuing for form and correct completion      Shoulder Exercises: Seated   Elevation  AROM;10 reps    Extension  AROM;10 reps    Row  AROM;10 reps      Shoulder Exercises: Therapy Ball    Flexion  10 reps    ABduction  10 reps      Shoulder Exercises: ROM/Strengthening   Wall Wash  1'    Thumb Tacks  1'      Manual Therapy   Manual Therapy  Myofascial release    Manual therapy  comments  Completed separately from therapeutic exercises    Myofascial Release  Myofascial release to left upper arm, trapezius, and scapularis regions to decrease pain and fascial restrictions and increase joint range of motion.              OT Education - 12/13/17 1323    Education provided  Yes    Education Details  shoulder AA/ROM    Person(s) Educated  Patient    Methods  Explanation;Demonstration;Handout    Comprehension  Verbalized understanding;Returned demonstration       OT Short Term Goals - 12/11/17 1335      OT SHORT TERM GOAL #1   Title  Pt will be provided with and independent in HEP to improve LUE mobility required for daily task completion.     Time  4    Period  Weeks    Status  On-going      OT SHORT TERM GOAL #2   Title  Pt will decrease pain in LUE to 3/10 or less to improve ability to sleep at night.     Time  4    Period  Weeks    Status  On-going      OT SHORT TERM GOAL #3   Title  Pt will increase LUE A/ROM to [facility] to improve ability to donn shirts and jackets.     Time  4    Period  Weeks    Status  On-going      OT SHORT TERM GOAL #4   Title  Pt will decrease LUE fascial restrictions from moderate to minimal amounts to improve mobility required for functional reaching tasks.     Time  4    Period  Weeks    Status  On-going      OT SHORT TERM GOAL #5   Title  Pt will improve LUE strength to 4/5 to improve ability to reach out and close car door.     Time  4    Period  Weeks    Status  On-going               Plan - 12/13/17 1320    Clinical Impression Statement  A: Continued with manual therapy, passive stretching, and AA/ROM, pt tolerating improved range today at approximately 60%. Added thumb tacks and wall wash this session.  Verbal cuing for form and technique during session, intermittent rest breaks for fatigue.     Plan  P: Add pulleys, follow up on AA/ROM HEP       Patient will benefit from skilled therapeutic intervention in order to improve the following deficits and impairments:  Decreased strength, Decreased range of motion, Pain, Increased fascial restrictions, Impaired UE functional use  Visit Diagnosis: Acute pain of left shoulder  Other symptoms and signs involving the musculoskeletal system    Problem List Patient Active Problem List   Diagnosis Date Noted  . Monoclonal gammopathy of unknown significance (MGUS) 11/26/2017  . Memory loss of unknown cause 11/10/2017  . Chronic left shoulder pain 09/14/2017  . Encounter for medication management 08/10/2017  . Encounter for chronic pain management 02/09/2017  . Carpopedal spasm 01/18/2017  . Anemia in chronic renal disease 12/30/2015  . Morbid obesity ([place]) 04/18/2015  . Myasthenia gravis in remission ([place]) 11/24/2014  . Myasthenia gravis ([place]) 11/16/2014  . Vitamin D deficiency 05/24/2014  . Osteopenia 04/26/2014  . Macular hole of left eye 01/19/2014  . Generalized osteoarthritis 05/25/2013  . Esophageal dysphagia 04/03/2012  .  Abnormal chest CT 03/02/2012  . Insomnia 01/02/2012  . Essential hypertension, benign 05/30/2011  . Schatzki's ring 05/07/2011  . INGROWN TOENAIL 01/10/2011  . GLAUCOMA 08/08/2010  . DYSCHROMIA, UNSPECIFIED 08/08/2010  . Chronic renal disease, stage 3, moderately decreased glomerular filtration rate (GFR) between 30-59 mL/min/1.73 square meter (HCC) 09/14/2009  . HIP PAIN, LEFT 06/09/2009  . Urinary incontinence 06/09/2009  . Iron deficiency anemia 10/02/2008  . Hypothyroidism 09/27/2008  . Hyperlipemia 06/30/2008  . Depression with anxiety 06/30/2008  . GERD 06/30/2008  . Obstructive sleep apnea 06/30/2008  . CARPAL TUNNEL SYNDROME 05/19/2008  . SHOULDER PAIN 02/02/2008  . IMPINGEMENT SYNDROME  02/02/2008  . DEGENERATIVE JOINT DISEASE, KNEE 11/24/2007  . Diabetes mellitus, insulin dependent (IDDM), controlled ([place]) 09/09/2007   Guadelupe Sabin, OTR/L  [phone] 12/13/2017, 1:49 PM  [place] [address] [place], [place], [address] Phone: [phone]   Fax:  [phone]  Name: [name] MRN: [id number] Date of Birth: October 11, 1941

## 2017-12-13 NOTE — Patient Instructions (Signed)
Perform each exercise __10-15______ reps. 2-3x days.   Protraction Start by holding a wand or cane at chest height.  Next, slowly push the wand outwards in front of your body so that your elbows become fully straightened. Then, return to the original position.     Shoulder FLEXION  In the standing position, hold a wand/cane with both arms, palms down on both sides. Raise up the wand/cane allowing your unaffected arm to perform most of the effort. Your affected arm should be partially relaxed.      Internal/External ROTATION  In the standing position, hold a wand/cane with both hands keeping your elbows bent. Move your arms and wand/cane to one side.  Your affected arm should be partially relaxed while your unaffected arm performs most of the effort.       Shoulder ABDUCTION   While holding a wand/cane palm face up on the injured side and palm face down on the uninjured side, slowly raise up your injured arm to the side.                     Horizontal Abduction/Adduction      Straight arms holding cane at shoulder height, bring cane to right, center, left. Repeat starting to left.   Copyright  VHI. All rights reserved.       

## 2017-12-16 ENCOUNTER — Other Ambulatory Visit: Payer: Self-pay

## 2017-12-16 ENCOUNTER — Encounter (HOSPITAL_COMMUNITY): Payer: Self-pay | Admitting: Occupational Therapy

## 2017-12-16 ENCOUNTER — Ambulatory Visit (HOSPITAL_COMMUNITY): Payer: Medicare Other | Admitting: Occupational Therapy

## 2017-12-16 DIAGNOSIS — R29898 Other symptoms and signs involving the musculoskeletal system: Secondary | ICD-10-CM | POA: Diagnosis not present

## 2017-12-16 DIAGNOSIS — M25512 Pain in left shoulder: Secondary | ICD-10-CM

## 2017-12-16 NOTE — Therapy (Signed)
[place], [place], [address] Phone: [phone]   Fax:  [phone]  Occupational Therapy Treatment  Patient Details  Name: [name] MRN: [id number] Date of Birth: May 05, 1941 Referring Provider (Historical): Dr. Arther Abbott   Encounter Date: 12/16/2017  OT End of Session - 12/16/17 1539    Visit Number  4    Number of Visits  8    Date for OT Re-Evaluation  01/03/18    Authorization Type  [employer] Medicare-$40 copay    Authorization Time Period  Before 10th visit    Authorization - Visit Number  4    Authorization - Number of Visits  10    OT Start Time  2229 pt arrived late    OT Stop Time  1600    OT Time Calculation (min)  39 min    Activity Tolerance  Patient tolerated treatment well    Behavior During Therapy  [facility] for tasks assessed/performed       Past Medical History:  Diagnosis Date  . Anemia in chronic renal disease 12/30/2015  . Anxiety   . Arthritis    "all over" (01/19/2014)  . Carpal tunnel syndrome   . Chronic bronchitis ([place])    "got it q yr for awhile; hasn't had it in awhile" (01/19/2014)  . Chronic kidney disease (CKD) stage G3b/A1, moderately decreased glomerular filtration rate (GFR) between 30-44 mL/min/1.73 square meter and albuminuria creatinine ratio less than 30 mg/g (HCC) 09/14/2009   Qualifier: Diagnosis of  By: Moshe Cipro MD, Joycelyn Schmid    . Chronic renal disease, stage 3, moderately decreased glomerular filtration rate (GFR) between 30-59 mL/min/1.73 square meter (HCC) 09/14/2009   Qualifier: Diagnosis of  By: Moshe Cipro MD, Joycelyn Schmid    . Depression   . Diverticulosis 07/2004   Colonscopy Dr Gala Romney  . Esophagitis, erosive 2009  . Exertional shortness of breath   . Gastroesophageal reflux   . NLGXQJJH(417.4)    "usually a couple times/wk" (01/19/2014)  . Heart murmur    saw cardiology In [place], he told her she did not need to come back.  . Hyperlipidemia   . Hypertension    . Impingement syndrome, shoulder   . Iron deficiency anemia   . Mitral regurgitation   . Myasthenia gravis   . Myasthenia gravis in remission ([place]) 11/24/2014  . Obesity   . OSA on CPAP   . Pneumonia 01/2012  . Pulmonary HTN ([place])   . Schatzki's ring    Last EGD w/ dilation 02/08/11, 2009 & 2007  . Seasonal allergies   . Shoulder pain   . Thyroid disease    "used to take RX; they took me off it" (01/19/2014)  . Tobacco abuse   . Type II diabetes mellitus ([place])     Past Surgical History:  Procedure Laterality Date  . [employer] VITRECTOMY WITH 20 GAUGE MVR PORT Left 01/19/2014   Procedure: 25 GAUGE PARS PLANA VITRECTOMY WITH 20 GAUGE MVR PORT; MEMBRAME PEEL; SERUM PATCH; LASER TREATMENT; C3F8;  Surgeon: Hayden Pedro, MD;  Location: [place] [facility];  Service: Ophthalmology;  Laterality: Left;  . ABDOMINAL HYSTERECTOMY    . APPENDECTOMY    . CARPAL TUNNEL RELEASE Bilateral   . CATARACT EXTRACTION W/PHACO Left 10/21/2013   Procedure: LEFT CATARACT EXTRACTION PHACO AND INTRAOCULAR LENS PLACEMENT (IOC);  Surgeon: Marylynn Pearson, MD;  Location: [facility];  Service: Ophthalmology;  Laterality: Left;  . CHOLECYSTECTOMY    . COLONOSCOPY  05/08/2012  [id number] and external hemorrhoids; colonic diverticulosis  . ESOPHAGEAL DILATION     "more than 3 times" (01/19/2014)  . ESOPHAGOGASTRODUODENOSCOPY  02/08/11   Rourk-Distal esophageal erosion consistent with mild erosive reflux   esophagitis/ Noncritical Schatzki ring, small hiatal hernia otherwise upper/ gastrointestinal tract appeared unremarkable, status post passage  of a Maloney dilation to biopsy disruption of the ring described  . ESOPHAGOGASTRODUODENOSCOPY  04/2012   2 tandem incomplete distal esophagea rings s/p dilation.   . ESOPHAGOGASTRODUODENOSCOPY N/A 09/16/2014   Procedure: ESOPHAGOGASTRODUODENOSCOPY (EGD);  Surgeon: Daneil Dolin, MD;  [facility];  Service: Endoscopy;  Laterality: N/A;  1200  . EYE SURGERY    .  INCONTINENCE SURGERY  08/26/09   Tananbaum  . JOINT REPLACEMENT     right total knee  . MALONEY DILATION N/A 09/16/2014   Procedure: Venia Minks DILATION;  Surgeon: Daneil Dolin, MD;  [facility];  Service: Endoscopy;  Laterality: N/A;  . PARS PLANA VITRECTOMY W/ REPAIR OF MACULAR HOLE Left 01/19/2014  . SAVORY DILATION N/A 09/16/2014   Procedure: SAVORY DILATION;  Surgeon: Daneil Dolin, MD;  [facility];  Service: Endoscopy;  Laterality: N/A;  . TONSILLECTOMY    . TOTAL KNEE ARTHROPLASTY Right 05/13/07   Dr. Aline Brochure    There were no vitals filed for this visit.  Subjective Assessment - 12/16/17 1523    Subjective   S: The pain is some better I think.     Currently in Pain?  Yes    Pain Score  6     Pain Location  Shoulder    Pain Orientation  Left    Pain Descriptors / Indicators  Aching;Sore    Pain Type  Acute pain    Pain Radiating Towards  none    Pain Onset  More than a month ago    Pain Frequency  Constant    Aggravating Factors   movement, use    Pain Relieving Factors  rest, heat, pain cream    Effect of Pain on Daily Activities  mod effect on ADL completion    Multiple Pain Sites  No         [facility] OT Assessment - 12/16/17 1522      Assessment   Medical Diagnosis  Left shoulder pain      Precautions   Precautions  None               OT Treatments/Exercises (OP) - 12/16/17 1524      Exercises   Exercises  Shoulder      Shoulder Exercises: Supine   Protraction  PROM;5 reps;AAROM;10 reps    Horizontal ABduction  PROM;5 reps;AAROM;10 reps    External Rotation  PROM;5 reps;AAROM;10 reps    Internal Rotation  PROM;5 reps;AAROM;10 reps    Flexion  PROM;5 reps;AAROM;10 reps    ABduction  PROM;5 reps;AAROM;10 reps    ABduction Limitations  Verbal cuing for form and correct completion      Shoulder Exercises: Seated   Protraction  AAROM;10 reps    Horizontal ABduction  AAROM;10 reps    External Rotation  AAROM;10 reps     Internal Rotation  AAROM;10 reps    Flexion  AAROM;10 reps    Abduction  AAROM;10 reps      Shoulder Exercises: Pulleys   Flexion  1 minute    ABduction  1 minute      Shoulder Exercises: ROM/Strengthening   Wall Wash  1'  Manual Therapy   Manual Therapy  Myofascial release    Manual therapy comments  Completed separately from therapeutic exercises    Myofascial Release  Myofascial release to left upper arm, trapezius, and scapularis regions to decrease pain and fascial restrictions and increase joint range of motion.                OT Short Term Goals - 12/11/17 1335      OT SHORT TERM GOAL #1   Title  Pt will be provided with and independent in HEP to improve LUE mobility required for daily task completion.     Time  4    Period  Weeks    Status  On-going      OT SHORT TERM GOAL #2   Title  Pt will decrease pain in LUE to 3/10 or less to improve ability to sleep at night.     Time  4    Period  Weeks    Status  On-going      OT SHORT TERM GOAL #3   Title  Pt will increase LUE A/ROM to [facility] to improve ability to donn shirts and jackets.     Time  4    Period  Weeks    Status  On-going      OT SHORT TERM GOAL #4   Title  Pt will decrease LUE fascial restrictions from moderate to minimal amounts to improve mobility required for functional reaching tasks.     Time  4    Period  Weeks    Status  On-going      OT SHORT TERM GOAL #5   Title  Pt will improve LUE strength to 4/5 to improve ability to reach out and close car door.     Time  4    Period  Weeks    Status  On-going               Plan - 12/16/17 1537    Clinical Impression Statement  A: Pt reporting pain as "much better than when I first started." Muscle knot palpated at left upper trapezius region, pt reporting soreness in this area during passive stretching. Pt achieved 75% ROM with AA/ROM today, verbal cuing for form and technique. Added AA/ROM in sitting and pulleys, pt did well with  form with verbal cuing. Pt reports HEP is going well.     Plan  P: Continue working to improve range with passive stretching within pain tolerance. Attempt A/ROM supine       Patient will benefit from skilled therapeutic intervention in order to improve the following deficits and impairments:  Decreased strength, Decreased range of motion, Pain, Increased fascial restrictions, Impaired UE functional use  Visit Diagnosis: Acute pain of left shoulder  Other symptoms and signs involving the musculoskeletal system    Problem List Patient Active Problem List   Diagnosis Date Noted  . Monoclonal gammopathy of unknown significance (MGUS) 11/26/2017  . Memory loss of unknown cause 11/10/2017  . Chronic left shoulder pain 09/14/2017  . Encounter for medication management 08/10/2017  . Encounter for chronic pain management 02/09/2017  . Carpopedal spasm 01/18/2017  . Anemia in chronic renal disease 12/30/2015  . Morbid obesity ([place]) 04/18/2015  . Myasthenia gravis in remission ([place]) 11/24/2014  . Myasthenia gravis ([place]) 11/16/2014  . Vitamin D deficiency 05/24/2014  . Osteopenia 04/26/2014  . Macular hole of left eye 01/19/2014  . Generalized osteoarthritis 05/25/2013  . Esophageal dysphagia 04/03/2012  .  Abnormal chest CT 03/02/2012  . Insomnia 01/02/2012  . Essential hypertension, benign 05/30/2011  . Schatzki's ring 05/07/2011  . INGROWN TOENAIL 01/10/2011  . GLAUCOMA 08/08/2010  . DYSCHROMIA, UNSPECIFIED 08/08/2010  . Chronic renal disease, stage 3, moderately decreased glomerular filtration rate (GFR) between 30-59 mL/min/1.73 square meter (HCC) 09/14/2009  . HIP PAIN, LEFT 06/09/2009  . Urinary incontinence 06/09/2009  . Iron deficiency anemia 10/02/2008  . Hypothyroidism 09/27/2008  . Hyperlipemia 06/30/2008  . Depression with anxiety 06/30/2008  . GERD 06/30/2008  . Obstructive sleep apnea 06/30/2008  . CARPAL TUNNEL SYNDROME 05/19/2008  . SHOULDER PAIN 02/02/2008  .  IMPINGEMENT SYNDROME 02/02/2008  . DEGENERATIVE JOINT DISEASE, KNEE 11/24/2007  . Diabetes mellitus, insulin dependent (IDDM), controlled ([place]) 09/09/2007   Guadelupe Sabin, OTR/L  [phone] 12/16/2017, 4:01 PM  [place], [place], [address] Phone: [phone]   Fax:  [phone]  Name: GURTHA PICKER MRN: [id number] Date of Birth: 14-Oct-1941

## 2017-12-17 ENCOUNTER — Encounter (HOSPITAL_COMMUNITY): Payer: Self-pay | Admitting: Occupational Therapy

## 2017-12-17 ENCOUNTER — Other Ambulatory Visit: Payer: Self-pay

## 2017-12-17 ENCOUNTER — Ambulatory Visit (HOSPITAL_COMMUNITY): Payer: Medicare Other | Admitting: Occupational Therapy

## 2017-12-17 DIAGNOSIS — R29898 Other symptoms and signs involving the musculoskeletal system: Secondary | ICD-10-CM | POA: Diagnosis not present

## 2017-12-17 DIAGNOSIS — M25512 Pain in left shoulder: Secondary | ICD-10-CM

## 2017-12-17 NOTE — Therapy (Signed)
[place], [place], [address] Phone: [phone]   Fax:  [phone]  Occupational Therapy Treatment  Patient Details  Name: [name] MRN: [id number] Date of Birth: [birth year] Referring Provider (Historical): Dr. Arther Abbott   Encounter Date: 12/17/2017  OT End of Session - 12/17/17 1430    Visit Number  5    Number of Visits  8    Date for OT Re-Evaluation  01/03/18    Authorization Type  [employer] Medicare-$40 copay    Authorization Time Period  Before 10th visit    Authorization - Visit Number  5    Authorization - Number of Visits  10    OT Start Time  3295    OT Stop Time  1344    OT Time Calculation (min)  41 min    Activity Tolerance  Patient tolerated treatment well    Behavior During Therapy  [facility] for tasks assessed/performed       Past Medical History:  Diagnosis Date  . Anemia in chronic renal disease 12/30/2015  . Anxiety   . Arthritis    "all over" (01/19/2014)  . Carpal tunnel syndrome   . Chronic bronchitis ([place])    "got it q yr for awhile; hasn't had it in awhile" (01/19/2014)  . Chronic kidney disease (CKD) stage G3b/A1, moderately decreased glomerular filtration rate (GFR) between 30-44 mL/min/1.73 square meter and albuminuria creatinine ratio less than 30 mg/g (HCC) 09/14/2009   Qualifier: Diagnosis of  By: Moshe Cipro MD, Joycelyn Schmid    . Chronic renal disease, stage 3, moderately decreased glomerular filtration rate (GFR) between 30-59 mL/min/1.73 square meter (HCC) 09/14/2009   Qualifier: Diagnosis of  By: Moshe Cipro MD, Joycelyn Schmid    . Depression   . Diverticulosis 07/2004   Colonscopy Dr Gala Romney  . Esophagitis, erosive 2009  . Exertional shortness of breath   . Gastroesophageal reflux   . JOACZYSA(630.1)    "usually a couple times/wk" (01/19/2014)  . Heart murmur    saw cardiology In [place], he told her she did not need to come back.  . Hyperlipidemia   . Hypertension   . Impingement  syndrome, shoulder   . Iron deficiency anemia   . Mitral regurgitation   . Myasthenia gravis   . Myasthenia gravis in remission ([place]) 11/24/2014  . Obesity   . OSA on CPAP   . Pneumonia 01/2012  . Pulmonary HTN ([place])   . Schatzki's ring    Last EGD w/ dilation 02/08/11, 2009 & 2007  . Seasonal allergies   . Shoulder pain   . Thyroid disease    "used to take RX; they took me off it" (01/19/2014)  . Tobacco abuse   . Type II diabetes mellitus ([place])     Past Surgical History:  Procedure Laterality Date  . [place] VITRECTOMY WITH 20 GAUGE MVR PORT Left 01/19/2014   Procedure: 25 GAUGE PARS PLANA VITRECTOMY WITH 20 GAUGE MVR PORT; MEMBRAME PEEL; SERUM PATCH; LASER TREATMENT; C3F8;  Surgeon: Hayden Pedro, MD;  Location: [place];  Service: Ophthalmology;  Laterality: Left;  . ABDOMINAL HYSTERECTOMY    . APPENDECTOMY    . CARPAL TUNNEL RELEASE Bilateral   . CATARACT EXTRACTION W/PHACO Left 10/21/2013   Procedure: LEFT CATARACT EXTRACTION PHACO AND INTRAOCULAR LENS PLACEMENT (IOC);  Surgeon: Marylynn Pearson, MD;  Location: [place];  Service: Ophthalmology;  Laterality: Left;  . CHOLECYSTECTOMY    . COLONOSCOPY  05/08/2012   [id number]  and external hemorrhoids; colonic diverticulosis  . ESOPHAGEAL DILATION     "more than 3 times" (01/19/2014)  . ESOPHAGOGASTRODUODENOSCOPY  02/08/11   Rourk-Distal esophageal erosion consistent with mild erosive reflux   esophagitis/ Noncritical Schatzki ring, small hiatal hernia otherwise upper/ gastrointestinal tract appeared unremarkable, status post passage  of a Maloney dilation to biopsy disruption of the ring described  . ESOPHAGOGASTRODUODENOSCOPY  04/2012   2 tandem incomplete distal esophagea rings s/p dilation.   . ESOPHAGOGASTRODUODENOSCOPY N/A 09/16/2014   Procedure: ESOPHAGOGASTRODUODENOSCOPY (EGD);  Surgeon: Daneil Dolin, MD;  [facility];  Service: Endoscopy;  Laterality: N/A;  1200  . EYE SURGERY    . INCONTINENCE SURGERY   08/26/09   T[place]baum  . JOINT REPLACEMENT     right total knee  . MALONEY DILATION N/A 09/16/2014   Procedure: Venia Minks DILATION;  Surgeon: Daneil Dolin, MD;  [facility];  Service: Endoscopy;  Laterality: N/A;  . [place]NA VITRECTOMY W/ REPAIR OF MACULAR HOLE Left 01/19/2014  . SAVORY DILATION N/A 09/16/2014   Procedure: SAVORY DILATION;  Surgeon: Daneil Dolin, MD;  [facility];  Service: Endoscopy;  Laterality: N/A;  . TONSILLECTOMY    . TOTAL KNEE ARTHROPLASTY Right 05/13/07   Dr. Aline Brochure    There were no vitals filed for this visit.  Subjective Assessment - 12/17/17 1306    Subjective   S: I was even able to mop the floors.     Currently in Pain?  Yes    Pain Score  6     Pain Location  Shoulder    Pain Orientation  Left    Pain Descriptors / Indicators  Aching;Sore    Pain Type  Acute pain    Pain Radiating Towards  none    Pain Onset  More than a month ago    Pain Frequency  Intermittent    Aggravating Factors   movement, use    Pain Relieving Factors  rest, heat, pain cream    Effect of Pain on Daily Activities  mod effect on ADL completion     Multiple Pain Sites  No         [facility] OT Assessment - 12/17/17 1306      Assessment   Medical Diagnosis  Left shoulder pain      Precautions   Precautions  None               OT Treatments/Exercises (OP) - 12/17/17 1307      Exercises   Exercises  Shoulder      Shoulder Exercises: Supine   Protraction  PROM;5 reps;AROM;10 reps    Horizontal ABduction  PROM;5 reps;AROM;10 reps    External Rotation  PROM;5 reps;AROM;10 reps    Internal Rotation  PROM;5 reps;AROM;10 reps    Flexion  PROM;5 reps;AROM;10 reps    ABduction  PROM;5 reps;AROM;10 reps    ABduction Limitations  2 finger tactile assist for guiding arm      Shoulder Exercises: Seated   Protraction  AAROM;10 reps    Horizontal ABduction  AAROM;10 reps    External Rotation  AROM;10 reps    Internal Rotation  AROM;10 reps     Flexion  AAROM;10 reps    Abduction  AAROM;10 reps      Shoulder Exercises: ROM/Strengthening   Proximal Shoulder Strengthening, Supine  10X each no rest breaks; verbal cuing      Manual Therapy   Manual Therapy  Myofascial release  Manual therapy comments  Completed separately from therapeutic exercises    Myofascial Release  Myofascial release to left upper arm, trapezius, and scapularis regions to decrease pain and fascial restrictions and increase joint range of motion.                OT Short Term Goals - 12/11/17 1335      OT SHORT TERM GOAL #1   Title  Pt will be provided with and independent in HEP to improve LUE mobility required for daily task completion.     Time  4    Period  Weeks    Status  On-going      OT SHORT TERM GOAL #2   Title  Pt will decrease pain in LUE to 3/10 or less to improve ability to sleep at night.     Time  4    Period  Weeks    Status  On-going      OT SHORT TERM GOAL #3   Title  Pt will increase LUE A/ROM to [facility] to improve ability to donn shirts and jackets.     Time  4    Period  Weeks    Status  On-going      OT SHORT TERM GOAL #4   Title  Pt will decrease LUE fascial restrictions from moderate to minimal amounts to improve mobility required for functional reaching tasks.     Time  4    Period  Weeks    Status  On-going      OT SHORT TERM GOAL #5   Title  Pt will improve LUE strength to 4/5 to improve ability to reach out and close car door.     Time  4    Period  Weeks    Status  On-going               Plan - 12/17/17 1333    Clinical Impression Statement  A: Pt reporting improvement with LUE use during daily tasks including housekeeping, putting on deodorant, and reaching up. Progressed to A/ROM supine, added proximal shoulder strengthening, and continued AA/ROM seated. Pt requiring occasional rest breaks for fatigue, verbal and tactile cuing for form and technique.     Plan  P: Continue working towards  improved range and functional LUE use. Add proximal shoulder strengthening in sitting if able to tolerate.     Consulted and Agree with Plan of Care  Patient       Patient will benefit from skilled therapeutic intervention in order to improve the following deficits and impairments:  Decreased strength, Decreased range of motion, Pain, Increased fascial restrictions, Impaired UE functional use  Visit Diagnosis: Acute pain of left shoulder  Other symptoms and signs involving the musculoskeletal system    Problem List Patient Active Problem List   Diagnosis Date Noted  . Monoclonal gammopathy of unknown significance (MGUS) 11/26/2017  . Memory loss of unknown cause 11/10/2017  . Chronic left shoulder pain 09/14/2017  . Encounter for medication management 08/10/2017  . Encounter for chronic pain management 02/09/2017  . Carpopedal spasm 01/18/2017  . Anemia in chronic renal disease 12/30/2015  . Morbid obesity ([place]) 04/18/2015  . Myasthenia gravis in remission ([place]) 11/24/2014  . Myasthenia gravis ([place]) 11/16/2014  . Vitamin D deficiency 05/24/2014  . Osteopenia 04/26/2014  . Macular hole of left eye 01/19/2014  . Generalized osteoarthritis 05/25/2013  . Esophageal dysphagia 04/03/2012  . Abnormal chest CT 03/02/2012  . Insomnia 01/02/2012  .  Essential hypertension, benign 05/30/2011  . Schatzki's ring 05/07/2011  . INGROWN TOENAIL 01/10/2011  . GLAUCOMA 08/08/2010  . DYSCHROMIA, UNSPECIFIED 08/08/2010  . Chronic renal disease, stage 3, moderately decreased glomerular filtration rate (GFR) between 30-59 mL/min/1.73 square meter (HCC) 09/14/2009  . HIP PAIN, LEFT 06/09/2009  . Urinary incontinence 06/09/2009  . Iron deficiency anemia 10/02/2008  . Hypothyroidism 09/27/2008  . Hyperlipemia 06/30/2008  . Depression with anxiety 06/30/2008  . GERD 06/30/2008  . Obstructive sleep apnea 06/30/2008  . CARPAL TUNNEL SYNDROME 05/19/2008  . SHOULDER PAIN 02/02/2008  . IMPINGEMENT  SYNDROME 02/02/2008  . DEGENERATIVE JOINT DISEASE, KNEE 11/24/2007  . Diabetes mellitus, insulin dependent (IDDM), controlled ([place]) 09/09/2007   Guadelupe Sabin, OTR/L  [phone] 12/17/2017, 2:31 PM  [place], [place], [address] Phone: [phone]   Fax:  [phone]  Name: MAKALA FETTEROLF MRN: [id number] Date of Birth: [birth year]

## 2017-12-19 ENCOUNTER — Other Ambulatory Visit: Payer: Self-pay | Admitting: Family Medicine

## 2017-12-27 ENCOUNTER — Ambulatory Visit (HOSPITAL_COMMUNITY): Payer: Medicare Other | Admitting: Occupational Therapy

## 2017-12-27 ENCOUNTER — Encounter (HOSPITAL_COMMUNITY): Payer: Self-pay | Admitting: Occupational Therapy

## 2017-12-27 ENCOUNTER — Other Ambulatory Visit: Payer: Self-pay

## 2017-12-27 DIAGNOSIS — R29898 Other symptoms and signs involving the musculoskeletal system: Secondary | ICD-10-CM | POA: Diagnosis not present

## 2017-12-27 DIAGNOSIS — M25512 Pain in left shoulder: Secondary | ICD-10-CM | POA: Diagnosis not present

## 2017-12-27 NOTE — Patient Instructions (Signed)

## 2017-12-27 NOTE — Therapy (Signed)
[place], [place], [address] Phone: [phone]   Fax:  [phone]  Occupational Therapy Treatment  Patient Details  Name: [name] MRN: [id number] Date of Birth: 1941/01/05 Referring Provider (Historical): Dr. Arther Abbott   Encounter Date: 12/27/2017  OT End of Session - 12/27/17 1352    Visit Number  6    Number of Visits  8    Date for OT Re-Evaluation  01/03/18    Authorization Type  [employer] Medicare-$40 copay    Authorization Time Period  Before 10th visit    Authorization - Visit Number  6    Authorization - Number of Visits  10    OT Start Time  1308    OT Stop Time  1350    OT Time Calculation (min)  42 min    Activity Tolerance  Patient tolerated treatment well    Behavior During Therapy  [facility] for tasks assessed/performed       Past Medical History:  Diagnosis Date  . Anemia in chronic renal disease 12/30/2015  . Anxiety   . Arthritis    "all over" (01/19/2014)  . Carpal tunnel syndrome   . Chronic bronchitis ([place])    "got it q yr for awhile; hasn't had it in awhile" (01/19/2014)  . Chronic kidney disease (CKD) stage G3b/A1, moderately decreased glomerular filtration rate (GFR) between 30-44 mL/min/1.73 square meter and albuminuria creatinine ratio less than 30 mg/g (HCC) 09/14/2009   Qualifier: Diagnosis of  By: Moshe Cipro MD, Joycelyn Schmid    . Chronic renal disease, stage 3, moderately decreased glomerular filtration rate (GFR) between 30-59 mL/min/1.73 square meter (HCC) 09/14/2009   Qualifier: Diagnosis of  By: Moshe Cipro MD, Joycelyn Schmid    . Depression   . Diverticulosis 07/2004   Colonscopy Dr Gala Romney  . Esophagitis, erosive 2009  . Exertional shortness of breath   . Gastroesophageal reflux   . QVZDGLOV(564.3)    "usually a couple times/wk" (01/19/2014)  . Heart murmur    saw cardiology In [place], he told her she did not need to come back.  . Hyperlipidemia   . Hypertension   . Impingement  syndrome, shoulder   . Iron deficiency anemia   . Mitral regurgitation   . Myasthenia gravis   . Myasthenia gravis in remission ([name]) 11/24/2014  . Obesity   . OSA on CPAP   . Pneumonia 01/2012  . Pulmonary HTN ([place])   . Schatzki's ring    Last EGD w/ dilation 02/08/11, 2009 & 2007  . Seasonal allergies   . Shoulder pain   . Thyroid disease    "used to take RX; they took me off it" (01/19/2014)  . Tobacco abuse   . Type II diabetes mellitus ([place])     Past Surgical History:  Procedure Laterality Date  . [place] VITRECTOMY WITH 20 GAUGE MVR PORT Left 01/19/2014   Procedure: 25 GAUGE PARS PLANA VITRECTOMY WITH 20 GAUGE MVR PORT; MEMBRAME PEEL; SERUM PATCH; LASER TREATMENT; C3F8;  Surgeon: Hayden Pedro, MD;  Location: [facility];  Service: Ophthalmology;  Laterality: Left;  . ABDOMINAL HYSTERECTOMY    . APPENDECTOMY    . CARPAL TUNNEL RELEASE Bilateral   . CATARACT EXTRACTION W/PHACO Left 10/21/2013   Procedure: LEFT CATARACT EXTRACTION PHACO AND INTRAOCULAR LENS PLACEMENT (IOC);  Surgeon: Marylynn Pearson, MD;  Location: [place] [facility];  Service: Ophthalmology;  Laterality: Left;  . CHOLECYSTECTOMY    . COLONOSCOPY  05/08/2012   PIR:[id number]  and external hemorrhoids; colonic diverticulosis  . ESOPHAGEAL DILATION     "more than 3 times" (01/19/2014)  . ESOPHAGOGASTRODUODENOSCOPY  02/08/11   Rourk-Distal esophageal erosion consistent with mild erosive reflux   esophagitis/ Noncritical Schatzki ring, small hiatal hernia otherwise upper/ gastrointestinal tract appeared unremarkable, status post passage  of a Maloney dilation to biopsy disruption of the ring described  . ESOPHAGOGASTRODUODENOSCOPY  04/2012   2 tandem incomplete distal esophagea rings s/p dilation.   . ESOPHAGOGASTRODUODENOSCOPY N/A 09/16/2014   Procedure: ESOPHAGOGASTRODUODENOSCOPY (EGD);  Surgeon: Daneil Dolin, MD;  [facility];  Service: Endoscopy;  Laterality: N/A;  1200  . EYE SURGERY    . INCONTINENCE SURGERY   08/26/09   [place]baum  . JOINT REPLACEMENT     right total knee  . MALONEY DILATION N/A 09/16/2014   Procedure: Venia Minks DILATION;  Surgeon: Daneil Dolin, MD;  [facility];  Service: Endoscopy;  Laterality: N/A;  . [place] VITRECTOMY W/ REPAIR OF MACULAR HOLE Left 01/19/2014  . SAVORY DILATION N/A 09/16/2014   Procedure: SAVORY DILATION;  Surgeon: Daneil Dolin, MD;  [facility];  Service: Endoscopy;  Laterality: N/A;  . TONSILLECTOMY    . TOTAL KNEE ARTHROPLASTY Right 05/13/07   Dr. Aline Brochure    There were no vitals filed for this visit.  Subjective Assessment - 12/27/17 1309    Subjective   S: It's feeling so much better.     Currently in Pain?  Yes    Pain Score  6     Pain Location  Shoulder    Pain Orientation  Left    Pain Descriptors / Indicators  Aching;Sore    Pain Type  Acute pain    Pain Radiating Towards  none    Pain Onset  More than a month ago    Pain Frequency  Intermittent    Aggravating Factors   movement, use    Pain Relieving Factors  rest, heat, pain cream    Effect of Pain on Daily Activities  mod effect on ADL completion    Multiple Pain Sites  No         [facility] OT Assessment - 12/27/17 1309      Assessment   Medical Diagnosis  Left shoulder pain      Precautions   Precautions  None               OT Treatments/Exercises (OP) - 12/27/17 1310      Exercises   Exercises  Shoulder      Shoulder Exercises: Supine   Protraction  PROM;5 reps;AROM;10 reps    Horizontal ABduction  PROM;5 reps;AROM;10 reps    External Rotation  PROM;5 reps;AROM;10 reps    Internal Rotation  PROM;5 reps;AROM;10 reps    Flexion  PROM;5 reps;AROM;10 reps    ABduction  PROM;5 reps;AROM;10 reps    ABduction Limitations  2 finger tactile assist for guiding arm      Shoulder Exercises: Seated   Protraction  AAROM;10 reps    Horizontal ABduction  AROM;10 reps    External Rotation  AROM;10 reps    Internal Rotation  AROM;10 reps     Flexion  AROM;10 reps    Abduction  AAROM;10 reps      Shoulder Exercises: ROM/Strengthening   Wall Wash  1'    Proximal Shoulder Strengthening, Supine  10X each no rest breaks; verbal cuing    Proximal Shoulder Strengthening, Seated  10X each no rest  breaks      Functional Reaching Activities   Mid Level  pt placed 14 clothespins along vertical pole of pinch tree. One rest break for fatigue.       Manual Therapy   Manual Therapy  Myofascial release    Manual therapy comments  Completed separately from therapeutic exercises    Myofascial Release  Myofascial release to left upper arm, trapezius, and scapularis regions to decrease pain and fascial restrictions and increase joint range of motion.              OT Education - 12/27/17 1335    Education provided  Yes    Education Details  shoulder A/ROM supine and seated    Person(s) Educated  Patient    Methods  Explanation;Demonstration;Handout    Comprehension  Verbalized understanding;Returned demonstration       OT Short Term Goals - 12/11/17 1335      OT SHORT TERM GOAL #1   Title  Pt will be provided with and independent in HEP to improve LUE mobility required for daily task completion.     Time  4    Period  Weeks    Status  On-going      OT SHORT TERM GOAL #2   Title  Pt will decrease pain in LUE to 3/10 or less to improve ability to sleep at night.     Time  4    Period  Weeks    Status  On-going      OT SHORT TERM GOAL #3   Title  Pt will increase LUE A/ROM to [facility] to improve ability to donn shirts and jackets.     Time  4    Period  Weeks    Status  On-going      OT SHORT TERM GOAL #4   Title  Pt will decrease LUE fascial restrictions from moderate to minimal amounts to improve mobility required for functional reaching tasks.     Time  4    Period  Weeks    Status  On-going      OT SHORT TERM GOAL #5   Title  Pt will improve LUE strength to 4/5 to improve ability to reach out and close car door.      Time  4    Period  Weeks    Status  On-going               Plan - 12/27/17 1333    Clinical Impression Statement  A: Pt reporting continued improvement with LUE, she is now able to mop her floors with greater ease. Continued with A/ROM in supine and added A/ROM in sitting today, added proximal shoulder strengthening in sitting as well as functional reaching task. Verbal cuing for form and technique, occasional rest break for fatigue.     Plan  P: Continued with A/ROM, follow up on HEP. Add scapular theraband if appropriate & pt is able to complete with good form    Consulted and Agree with Plan of Care  Patient       Patient will benefit from skilled therapeutic intervention in order to improve the following deficits and impairments:  Decreased strength, Decreased range of motion, Pain, Increased fascial restrictions, Impaired UE functional use  Visit Diagnosis: Acute pain of left shoulder  Other symptoms and signs involving the musculoskeletal system    Problem List Patient Active Problem List   Diagnosis Date Noted  . Monoclonal gammopathy of unknown significance (MGUS) 11/26/2017  .  Memory loss of unknown cause 11/10/2017  . Chronic left shoulder pain 09/14/2017  . Encounter for medication management 08/10/2017  . Encounter for chronic pain management 02/09/2017  . Carpopedal spasm 01/18/2017  . Anemia in chronic renal disease 12/30/2015  . Morbid obesity ([place]) 04/18/2015  . Myasthenia gravis in remission ([place]) 11/24/2014  . Myasthenia gravis ([place]) 11/16/2014  . Vitamin D deficiency 05/24/2014  . Osteopenia 04/26/2014  . Macular hole of left eye 01/19/2014  . Generalized osteoarthritis 05/25/2013  . Esophageal dysphagia 04/03/2012  . Abnormal chest CT 03/02/2012  . Insomnia 01/02/2012  . Essential hypertension, benign 05/30/2011  . Schatzki's ring 05/07/2011  . INGROWN TOENAIL 01/10/2011  . GLAUCOMA 08/08/2010  . DYSCHROMIA, UNSPECIFIED 08/08/2010  . Chronic  renal disease, stage 3, moderately decreased glomerular filtration rate (GFR) between 30-59 mL/min/1.73 square meter (HCC) 09/14/2009  . HIP PAIN, LEFT 06/09/2009  . Urinary incontinence 06/09/2009  . Iron deficiency anemia 10/02/2008  . Hypothyroidism 09/27/2008  . Hyperlipemia 06/30/2008  . Depression with anxiety 06/30/2008  . GERD 06/30/2008  . Obstructive sleep apnea 06/30/2008  . CARPAL TUNNEL SYNDROME 05/19/2008  . SHOULDER PAIN 02/02/2008  . IMPINGEMENT SYNDROME 02/02/2008  . DEGENERATIVE JOINT DISEASE, KNEE 11/24/2007  . Diabetes mellitus, insulin dependent (IDDM), controlled ([place]) 09/09/2007   Guadelupe Sabin, OTR/L  [phone] 12/27/2017, 1:53 PM  [place] [address] [place], [place], [address] Phone: [phone]   Fax:  [phone]  Name: PAYLIN HAILU MRN: [id number] Date of Birth: February 08, 1941

## 2017-12-30 ENCOUNTER — Encounter (HOSPITAL_COMMUNITY): Payer: Self-pay | Admitting: Specialist

## 2017-12-30 ENCOUNTER — Other Ambulatory Visit: Payer: Self-pay | Admitting: Family Medicine

## 2017-12-30 ENCOUNTER — Ambulatory Visit (HOSPITAL_COMMUNITY): Payer: Medicare Other | Admitting: Specialist

## 2017-12-30 DIAGNOSIS — R29898 Other symptoms and signs involving the musculoskeletal system: Secondary | ICD-10-CM

## 2017-12-30 DIAGNOSIS — M25512 Pain in left shoulder: Secondary | ICD-10-CM | POA: Diagnosis not present

## 2017-12-30 NOTE — Therapy (Signed)
[place], [place], [address] Phone: [phone]   Fax:  [phone]  Occupational Therapy Treatment  Patient Details  Name: [name] MRN: [id number] Date of Birth: [birth year] Referring Provider (Historical): Dr. Arther Abbott   Encounter Date: 12/30/2017  OT End of Session - 12/30/17 1449    Visit Number  7    Number of Visits  8    Date for OT Re-Evaluation  01/03/18    Authorization Type  [employer] Medicare-$40 copay    Authorization Time Period  Before 10th visit    Authorization - Visit Number  7    Authorization - Number of Visits  10    OT Start Time  6962 arrived late for 1300 appt    OT Stop Time  1349    OT Time Calculation (min)  34 min    Activity Tolerance  Patient tolerated treatment well    Behavior During Therapy  [facility] for tasks assessed/performed       Past Medical History:  Diagnosis Date  . Anemia in chronic renal disease 12/30/2015  . Anxiety   . Arthritis    "all over" (01/19/2014)  . Carpal tunnel syndrome   . Chronic bronchitis ([place])    "got it q yr for awhile; hasn't had it in awhile" (01/19/2014)  . Chronic kidney disease (CKD) stage G3b/A1, moderately decreased glomerular filtration rate (GFR) between 30-44 mL/min/1.73 square meter and albuminuria creatinine ratio less than 30 mg/g (HCC) 09/14/2009   Qualifier: Diagnosis of  By: Moshe Cipro MD, Joycelyn Schmid    . Chronic renal disease, stage 3, moderately decreased glomerular filtration rate (GFR) between 30-59 mL/min/1.73 square meter (HCC) 09/14/2009   Qualifier: Diagnosis of  By: Moshe Cipro MD, Joycelyn Schmid    . Depression   . Diverticulosis 07/2004   Colonscopy Dr Gala Romney  . Esophagitis, erosive 2009  . Exertional shortness of breath   . Gastroesophageal reflux   . XBMWUXLK(440.1)    "usually a couple times/wk" (01/19/2014)  . Heart murmur    saw cardiology In [place], he told her she did not need to come back.  . Hyperlipidemia   .  Hypertension   . Impingement syndrome, shoulder   . Iron deficiency anemia   . Mitral regurgitation   . Myasthenia gravis   . Myasthenia gravis in remission ([place]) 11/24/2014  . Obesity   . OSA on CPAP   . Pneumonia 01/2012  . Pulmonary HTN ([place])   . Schatzki's ring    Last EGD w/ dilation 02/08/11, 2009 & 2007  . Seasonal allergies   . Shoulder pain   . Thyroid disease    "used to take RX; they took me off it" (01/19/2014)  . Tobacco abuse   . Type II diabetes mellitus ([place])     Past Surgical History:  Procedure Laterality Date  . [place] VITRECTOMY WITH 20 GAUGE MVR PORT Left 01/19/2014   Procedure: 25 GAUGE PARS PLANA VITRECTOMY WITH 20 GAUGE MVR PORT; MEMBRAME PEEL; SERUM PATCH; LASER TREATMENT; C3F8;  Surgeon: Hayden Pedro, MD;  Location: [place];  Service: Ophthalmology;  Laterality: Left;  . ABDOMINAL HYSTERECTOMY    . APPENDECTOMY    . CARPAL TUNNEL RELEASE Bilateral   . CATARACT EXTRACTION W/PHACO Left 10/21/2013   Procedure: LEFT CATARACT EXTRACTION PHACO AND INTRAOCULAR LENS PLACEMENT (IOC);  Surgeon: Marylynn Pearson, MD;  Location: [place];  Service: Ophthalmology;  Laterality: Left;  . CHOLECYSTECTOMY    . COLONOSCOPY  05/08/2012   TFT:DDUKGURK and external hemorrhoids; colonic diverticulosis  . ESOPHAGEAL DILATION     "more than 3 times" (01/19/2014)  . ESOPHAGOGASTRODUODENOSCOPY  02/08/11   Rourk-Distal esophageal erosion consistent with mild erosive reflux   esophagitis/ Noncritical Schatzki ring, small hiatal hernia otherwise upper/ gastrointestinal tract appeared unremarkable, status post passage  of a Maloney dilation to biopsy disruption of the ring described  . ESOPHAGOGASTRODUODENOSCOPY  04/2012   2 tandem incomplete distal esophagea rings s/p dilation.   . ESOPHAGOGASTRODUODENOSCOPY N/A 09/16/2014   Procedure: ESOPHAGOGASTRODUODENOSCOPY (EGD);  Surgeon: Daneil Dolin, MD;  [facility];  Service: Endoscopy;  Laterality: N/A;  1200  . EYE SURGERY     . INCONTINENCE SURGERY  08/26/09   [place]baum  . JOINT REPLACEMENT     right total knee  . MALONEY DILATION N/A 09/16/2014   Procedure: Venia Minks DILATION;  Surgeon: Daneil Dolin, MD;  [facility];  Service: Endoscopy;  Laterality: N/A;  . PARS PLANA VITRECTOMY W/ REPAIR OF MACULAR HOLE Left 01/19/2014  . SAVORY DILATION N/A 09/16/2014   Procedure: SAVORY DILATION;  Surgeon: Daneil Dolin, MD;  [facility];  Service: Endoscopy;  Laterality: N/A;  . TONSILLECTOMY    . TOTAL KNEE ARTHROPLASTY Right 05/13/07   Dr. Aline Brochure    There were no vitals filed for this visit.  Subjective Assessment - 12/30/17 1331    Subjective   S:  Its not hurting as bad as it was before.     Currently in Pain?  Yes    Pain Score  3     Pain Location  Shoulder    Pain Orientation  Left    Pain Descriptors / Indicators  Aching;Sore    Pain Type  Acute pain    Pain Onset  More than a month ago    Pain Frequency  Intermittent         [facility] OT Assessment - 12/30/17 0001      Assessment   Medical Diagnosis  Left shoulder pain               OT Treatments/Exercises (OP) - 12/30/17 0001      Exercises   Exercises  Shoulder      Shoulder Exercises: Supine   Protraction  PROM;5 reps;AROM;12 reps    Horizontal ABduction  PROM;5 reps;AROM;12 reps    External Rotation  PROM;10 reps;AROM;12 reps    Internal Rotation  PROM;5 reps;AROM;12 reps    Flexion  PROM;5 reps;AROM;12 reps    ABduction  PROM;5 reps;AROM;12 reps      Shoulder Exercises: Seated   Extension  Theraband;10 reps    Theraband Level (Shoulder Extension)  Level 2 (Red)    Retraction  Theraband;10 reps    Theraband Level (Shoulder Retraction)  Level 2 (Red)    Row  Theraband;10 reps    Theraband Level (Shoulder Row)  Level 2 (Red)    Protraction  AROM;10 reps    Horizontal ABduction  AROM;10 reps    External Rotation  AROM;10 reps    Internal Rotation  AROM;10 reps    Flexion  AROM;10 reps    Abduction   AROM;10 reps      Shoulder Exercises: Therapy Ball   Right/Left  5 reps      Manual Therapy   Manual Therapy  Myofascial release    Manual therapy comments  Completed separately from therapeutic exercises    Myofascial Release  Myofascial release to left upper arm,  trapezius, and scapularis regions to decrease pain and fascial restrictions and increase joint range of motion.                OT Short Term Goals - 12/11/17 1335      OT SHORT TERM GOAL #1   Title  Pt will be provided with and independent in HEP to improve LUE mobility required for daily task completion.     Time  4    Period  Weeks    Status  On-going      OT SHORT TERM GOAL #2   Title  Pt will decrease pain in LUE to 3/10 or less to improve ability to sleep at night.     Time  4    Period  Weeks    Status  On-going      OT SHORT TERM GOAL #3   Title  Pt will increase LUE A/ROM to [facility] to improve ability to donn shirts and jackets.     Time  4    Period  Weeks    Status  On-going      OT SHORT TERM GOAL #4   Title  Pt will decrease LUE fascial restrictions from moderate to minimal amounts to improve mobility required for functional reaching tasks.     Time  4    Period  Weeks    Status  On-going      OT SHORT TERM GOAL #5   Title  Pt will improve LUE strength to 4/5 to improve ability to reach out and close car door.     Time  4    Period  Weeks    Status  On-going               Plan - 12/30/17 1450    Clinical Impression Statement  A:  Patient able to complete a/ROM this date with good form in supine and seated.  Added scapular theraband and ball circles for greater scapular stability and shoulder mobility.     Plan  P:  increase repetitions with scapular theraband, add ball on wall, x to v and w arms for greater proximal shoulder strength needed for functional activities .       Patient will benefit from skilled therapeutic intervention in order to improve the following deficits and  impairments:  Decreased strength, Decreased range of motion, Pain, Increased fascial restrictions, Impaired UE functional use  Visit Diagnosis: Acute pain of left shoulder  Other symptoms and signs involving the musculoskeletal system    Problem List Patient Active Problem List   Diagnosis Date Noted  . Monoclonal gammopathy of unknown significance (MGUS) 11/26/2017  . Memory loss of unknown cause 11/10/2017  . Chronic left shoulder pain 09/14/2017  . Encounter for medication management 08/10/2017  . Encounter for chronic pain management 02/09/2017  . Carpopedal spasm 01/18/2017  . Anemia in chronic renal disease 12/30/2015  . Morbid obesity ([place]) 04/18/2015  . Myasthenia gravis in remission ([place]) 11/24/2014  . Myasthenia gravis ([place]) 11/16/2014  . Vitamin D deficiency 05/24/2014  . Osteopenia 04/26/2014  . Macular hole of left eye 01/19/2014  . Generalized osteoarthritis 05/25/2013  . Esophageal dysphagia 04/03/2012  . Abnormal chest CT 03/02/2012  . Insomnia 01/02/2012  . Essential hypertension, benign 05/30/2011  . Schatzki's ring 05/07/2011  . INGROWN TOENAIL 01/10/2011  . GLAUCOMA 08/08/2010  . DYSCHROMIA, UNSPECIFIED 08/08/2010  . Chronic renal disease, stage 3, moderately decreased glomerular filtration rate (GFR) between 30-59 mL/min/1.73 square meter (HCC)  09/14/2009  . HIP PAIN, LEFT 06/09/2009  . Urinary incontinence 06/09/2009  . Iron deficiency anemia 10/02/2008  . Hypothyroidism 09/27/2008  . Hyperlipemia 06/30/2008  . Depression with anxiety 06/30/2008  . GERD 06/30/2008  . Obstructive sleep apnea 06/30/2008  . CARPAL TUNNEL SYNDROME 05/19/2008  . SHOULDER PAIN 02/02/2008  . IMPINGEMENT SYNDROME 02/02/2008  . DEGENERATIVE JOINT DISEASE, KNEE 11/24/2007  . Diabetes mellitus, insulin dependent (IDDM), controlled ([place]) 09/09/2007    Vangie Bicker, [place], OTR/L [phone]  12/30/2017, 2:53 PM  [place] [address] [place], [place], [address] Phone: [phone]   Fax:  [phone]  Name: [name] MRN: [id number] Date of Birth: 1941/08/26

## 2018-01-02 ENCOUNTER — Telehealth (HOSPITAL_COMMUNITY): Payer: Self-pay | Admitting: Family Medicine

## 2018-01-02 ENCOUNTER — Ambulatory Visit (HOSPITAL_COMMUNITY): Payer: Medicare Other | Admitting: Occupational Therapy

## 2018-01-02 NOTE — Telephone Encounter (Signed)
01/02/18  Pt left a message to cancel but no reason was given

## 2018-01-07 ENCOUNTER — Other Ambulatory Visit: Payer: Self-pay | Admitting: Family Medicine

## 2018-01-07 ENCOUNTER — Ambulatory Visit (INDEPENDENT_AMBULATORY_CARE_PROVIDER_SITE_OTHER): Payer: Medicare Other | Admitting: Family Medicine

## 2018-01-07 ENCOUNTER — Encounter: Payer: Self-pay | Admitting: Family Medicine

## 2018-01-07 VITALS — BP 134/80 | HR 69 | Resp 16 | Ht 66.0 in | Wt 241.0 lb

## 2018-01-07 DIAGNOSIS — IMO0001 Reserved for inherently not codable concepts without codable children: Secondary | ICD-10-CM

## 2018-01-07 DIAGNOSIS — Z794 Long term (current) use of insulin: Secondary | ICD-10-CM | POA: Diagnosis not present

## 2018-01-07 DIAGNOSIS — G8929 Other chronic pain: Secondary | ICD-10-CM | POA: Diagnosis not present

## 2018-01-07 DIAGNOSIS — E119 Type 2 diabetes mellitus without complications: Secondary | ICD-10-CM

## 2018-01-07 DIAGNOSIS — I10 Essential (primary) hypertension: Secondary | ICD-10-CM | POA: Diagnosis not present

## 2018-01-07 DIAGNOSIS — E785 Hyperlipidemia, unspecified: Secondary | ICD-10-CM

## 2018-01-07 DIAGNOSIS — E032 Hypothyroidism due to medicaments and other exogenous substances: Secondary | ICD-10-CM

## 2018-01-07 DIAGNOSIS — F418 Other specified anxiety disorders: Secondary | ICD-10-CM | POA: Diagnosis not present

## 2018-01-07 DIAGNOSIS — M25512 Pain in left shoulder: Secondary | ICD-10-CM

## 2018-01-07 DIAGNOSIS — M159 Polyosteoarthritis, unspecified: Secondary | ICD-10-CM | POA: Diagnosis not present

## 2018-01-07 MED ORDER — HYDROCODONE-ACETAMINOPHEN 10-325 MG PO TABS: ORAL_TABLET | ORAL | 0 refills | Status: DC

## 2018-01-07 NOTE — Progress Notes (Signed)
01/03/2018  1   11/06/2017  Hydrocodone-Acetamin 10-325 MG  60 30 Ma Sim  [id number] Rxc (9167)  0 20.00 MME  Medicare  [place]  12/03/2017  1   11/06/2017  Hydrocodone-Acetamin 10-325 MG  60 30 Ma Sim  [id number] Rxc (9167)  0 20.00 MME  Medicare  [place]  11/27/2017  1   11/27/2017  Diazepam 5 MG Tablet  1 [address]  [id number] Rxc (9167)  0 0.50 LME  Medicare  [place]  11/06/2017  1   11/06/2017  Hydrocodone-Acetamin 10-325 MG  60 30 Ma Sim  [id number] Rxc (9167)  0 20.00 MME  Medicare  [place]  10/03/2017  1   08/06/2017  Hydrocodone-Acetamin 10-325 MG  60 30 Ma Sim  [id number] Rxc (9167)  0 20.00 MME  Medicare  [place]  09/05/2017  1   09/05/2017  Hydrocodone-Acetamin 10-325 MG  60 30 Ma Sim  [id number] Rxc (9167)  0 20.00 MME  Medicare  [place]  08/06/2017  1   08/06/2017  Hydrocodone-Acetamin 10-325 MG  60 30 Ma Sim  [id number] Rxc (9167)  0 20.00 MME  Medicare  [place]  07/02/2017  1   07/02/2017  Hydrocodone-Acetamin 10-325 MG  60 30 Ma Sim  [id number] Rxc (9167)  0 20.00 MME  Medicare  [place]  06/05/2017  1   06/05/2017  Hydrocodone-Acetamin 10-325 MG  60 30 Ma Sim  [id number] Rxc (9167)  0 20.00 MME  Medicare                   Registrty reviewed at visit

## 2018-01-07 NOTE — Patient Instructions (Addendum)
F/U  In 12 weeks, call if you need me before  No change in medication  Fasting lipid, cmp and eGFR, hBA1C, tSH 1 week before next visit  Thankful that you are doing better  Please work on good  health habits so that your health will improve. 1. Commitment to daily physical activity for 30 to 60  minutes, if you are able to do this.  2. Commitment to wise food choices. Aim for half of your  food intake to be vegetable and fruit, one quarter starchy foods, and one quarter protein. Try to eat on a regular schedule  3 meals per day, snacking between meals should be limited to vegetables or fruits or small portions of nuts. 64 ounces of water per day is generally recommended, unless you have specific health conditions, like heart failure or kidney failure where you will need to limit fluid intake.  3. Commitment to sufficient and a  good quality of physical and mental rest daily, generally between 6 to 8 hours per day.  WITH PERSISTANCE AND PERSEVERANCE, THE IMPOSSIBLE , BECOMES THE NORM!

## 2018-01-08 ENCOUNTER — Other Ambulatory Visit: Payer: Self-pay

## 2018-01-08 ENCOUNTER — Ambulatory Visit (HOSPITAL_COMMUNITY): Payer: Medicare Other | Attending: Orthopedic Surgery | Admitting: Occupational Therapy

## 2018-01-08 ENCOUNTER — Encounter (HOSPITAL_COMMUNITY): Payer: Self-pay | Admitting: Occupational Therapy

## 2018-01-08 DIAGNOSIS — R29898 Other symptoms and signs involving the musculoskeletal system: Secondary | ICD-10-CM | POA: Diagnosis not present

## 2018-01-08 DIAGNOSIS — M25512 Pain in left shoulder: Secondary | ICD-10-CM | POA: Diagnosis not present

## 2018-01-08 NOTE — Therapy (Signed)
[place], [place], [address] Phone: [phone]   Fax:  [phone]  Occupational Therapy Reassessment and Treatment (recertification)  Patient Details  Name: [name] MRN: [id number] Date of Birth: 1941/10/17 Referring Provider (Historical): Dr. Arther Abbott   Encounter Date: 01/08/2018  OT End of Session - 01/08/18 1406    Visit Number  8    Number of Visits  16    Date for OT Re-Evaluation  02/07/18    Authorization Type  [employer] Medicare-$40 copay    OT Start Time  1309 pt arrived late    OT Stop Time  1345    OT Time Calculation (min)  36 min    Activity Tolerance  Patient tolerated treatment well    Behavior During Therapy  [facility] for tasks assessed/performed       Past Medical History:  Diagnosis Date  . Anemia in chronic renal disease 12/30/2015  . Anxiety   . Arthritis    "all over" (01/19/2014)  . Carpal tunnel syndrome   . Chronic bronchitis ([place])    "got it q yr for awhile; hasn't had it in awhile" (01/19/2014)  . Chronic kidney disease (CKD) stage G3b/A1, moderately decreased glomerular filtration rate (GFR) between 30-44 mL/min/1.73 square meter and albuminuria creatinine ratio less than 30 mg/g (HCC) 09/14/2009   Qualifier: Diagnosis of  By: Moshe Cipro MD, Joycelyn Schmid    . Chronic renal disease, stage 3, moderately decreased glomerular filtration rate (GFR) between 30-59 mL/min/1.73 square meter (HCC) 09/14/2009   Qualifier: Diagnosis of  By: Moshe Cipro MD, Joycelyn Schmid    . Depression   . Diverticulosis 07/2004   Colonscopy Dr Gala Romney  . Esophagitis, erosive 2009  . Exertional shortness of breath   . Gastroesophageal reflux   . BTDVVOHY(073.7)    "usually a couple times/wk" (01/19/2014)  . Heart murmur    saw cardiology In [place], he told her she did not need to come back.  . Hyperlipidemia   . Hypertension   . Impingement syndrome, shoulder   . Iron deficiency anemia   . Mitral regurgitation    . Myasthenia gravis   . Myasthenia gravis in remission ([place]) 11/24/2014  . Obesity   . OSA on CPAP   . Pneumonia 01/2012  . Pulmonary HTN ([place])   . Schatzki's ring    Last EGD w/ dilation 02/08/11, 2009 & 2007  . Seasonal allergies   . Shoulder pain   . Thyroid disease    "used to take RX; they took me off it" (01/19/2014)  . Tobacco abuse   . Type II diabetes mellitus ([place])     Past Surgical History:  Procedure Laterality Date  . [place] VITRECTOMY WITH 20 GAUGE MVR PORT Left 01/19/2014   Procedure: 25 GAUGE PARS PLANA VITRECTOMY WITH 20 GAUGE MVR PORT; MEMBRAME PEEL; SERUM PATCH; LASER TREATMENT; C3F8;  Surgeon: Hayden Pedro, MD;  Location: [facility];  Service: Ophthalmology;  Laterality: Left;  . ABDOMINAL HYSTERECTOMY    . APPENDECTOMY    . CARPAL TUNNEL RELEASE Bilateral   . CATARACT EXTRACTION W/PHACO Left 10/21/2013   Procedure: LEFT CATARACT EXTRACTION PHACO AND INTRAOCULAR LENS PLACEMENT (IOC);  Surgeon: Marylynn Pearson, MD;  Location: [facility];  Service: Ophthalmology;  Laterality: Left;  . CHOLECYSTECTOMY    . COLONOSCOPY  05/08/2012   TGG:YIRSWNIO and external hemorrhoids; colonic diverticulosis  . ESOPHAGEAL DILATION     "more than 3 times" (01/19/2014)  . ESOPHAGOGASTRODUODENOSCOPY  02/08/11  Rourk-Distal esophageal erosion consistent with mild erosive reflux   esophagitis/ Noncritical Schatzki ring, small hiatal hernia otherwise upper/ gastrointestinal tract appeared unremarkable, status post passage  of a Maloney dilation to biopsy disruption of the ring described  . ESOPHAGOGASTRODUODENOSCOPY  04/2012   2 tandem incomplete distal esophagea rings s/p dilation.   . ESOPHAGOGASTRODUODENOSCOPY N/A 09/16/2014   Procedure: ESOPHAGOGASTRODUODENOSCOPY (EGD);  Surgeon: Daneil Dolin, MD;  [facility];  Service: Endoscopy;  Laterality: N/A;  1200  . EYE SURGERY    . INCONTINENCE SURGERY  08/26/09   [place]ananbaum  . JOINT REPLACEMENT     right total knee  . MALONEY  DILATION N/A 09/16/2014   Procedure: Venia Minks DILATION;  Surgeon: Daneil Dolin, MD;  [facility];  Service: Endoscopy;  Laterality: N/A;  . PARS PLANA VITRECTOMY W/ REPAIR OF MACULAR HOLE Left 01/19/2014  . SAVORY DILATION N/A 09/16/2014   Procedure: SAVORY DILATION;  Surgeon: Daneil Dolin, MD;  [facility];  Service: Endoscopy;  Laterality: N/A;  . TONSILLECTOMY    . TOTAL KNEE ARTHROPLASTY Right 05/13/07   Dr. Aline Brochure    There were no vitals filed for this visit.  Subjective Assessment - 01/08/18 1310    Subjective   S: My shoulder is doing great.     Currently in Pain?  No/denies         [facility] OT Assessment - 01/08/18 1309      Assessment   Medical Diagnosis  Left shoulder pain      Precautions   Precautions  None      Palpation   Palpation comment  Moderate fascial restrictions in left upper arm, trapezius, and scapularis regions      AROM   Overall AROM Comments  Assessed seated, er/IR adducted    AROM Assessment Site  Shoulder    Right/Left Shoulder  Left    Left Shoulder Flexion  116 Degrees 82 previous    Left Shoulder ABduction  101 Degrees 55 previous    Left Shoulder Internal Rotation  90 Degrees same as previous      PROM   Overall PROM Comments  Assessed supine, er/IR adducted    PROM Assessment Site  Shoulder    Right/Left Shoulder  Left    Left Shoulder Flexion  156 Degrees 101 previous    Left Shoulder ABduction  158 Degrees 71 previous    Left Shoulder Internal Rotation  90 Degrees same as previous    Left Shoulder External Rotation  65 Degrees 25 previous      Strength   Overall Strength Comments  Assessed seated, er/IR adducted    Strength Assessment Site  Shoulder    Right/Left Shoulder  Left    Left Shoulder Flexion  3+/5 3-/5 previous    Left Shoulder ABduction  3+/5 3-/5 previous    Left Shoulder Internal Rotation  4-/5 3-/5 previous    Left Shoulder External Rotation  3/5 3-/5 previous               OT  Treatments/Exercises (OP) - 01/08/18 1311      Exercises   Exercises  Shoulder      Shoulder Exercises: Supine   Protraction  PROM;5 reps;AROM;12 reps    Horizontal ABduction  PROM;5 reps;AROM;12 reps    External Rotation  PROM;10 reps;AROM;12 reps    Internal Rotation  PROM;5 reps;AROM;12 reps    Flexion  PROM;5 reps;AROM;12 reps    ABduction  PROM;5 reps;AROM;12 reps  Shoulder Exercises: Seated   Extension  Theraband;10 reps    Theraband Level (Shoulder Extension)  Level 2 (Red)    Retraction Limitations  unable to complete with proper form    Row Limitations  unable to complete with proper form      Manual Therapy   Manual Therapy  Myofascial release    Manual therapy comments  Completed separately from therapeutic exercises    Myofascial Release  Myofascial release to left upper arm, trapezius, and scapularis regions to decrease pain and fascial restrictions and increase joint range of motion.                OT Short Term Goals - 01/08/18 1338      OT SHORT TERM GOAL #1   Title  Pt will be provided with and independent in HEP to improve LUE mobility required for daily task completion.     Time  4    Period  Weeks    Status  On-going      OT SHORT TERM GOAL #2   Title  Pt will decrease pain in LUE to 3/10 or less to improve ability to sleep at night.     Time  4    Period  Weeks    Status  On-going      OT SHORT TERM GOAL #3   Title  Pt will increase LUE A/ROM to [facility] to improve ability to donn shirts and jackets.     Time  4    Period  Weeks    Status  On-going      OT SHORT TERM GOAL #4   Title  Pt will decrease LUE fascial restrictions from moderate to minimal amounts to improve mobility required for functional reaching tasks.     Time  4    Period  Weeks    Status  On-going      OT SHORT TERM GOAL #5   Title  Pt will improve LUE strength to 4/5 to improve ability to reach out and close car door.     Time  4    Period  Weeks    Status  On-going                Plan - 01/08/18 1407    Clinical Impression Statement  A: Reassessment completed this session, pt is making good progress towards goals with improvements in pain, ROM, and strength for functional task completion using LUE. Pt reporting improvements in ability to complete B/ADLs at home. This session continued with A/ROM, attempted scapular theraband however pt unable to complete with correct form so only completed extension.     Plan  P: Continue skilled OT services 2x/week focusing on improving ROM, strength, and pain. Next session: attempt scapular theraband, add ball on wall and x to v arms if pt able to tolerate    Consulted and Agree with Plan of Care  Patient       Patient will benefit from skilled therapeutic intervention in order to improve the following deficits and impairments:  Decreased strength, Decreased range of motion, Pain, Increased fascial restrictions, Impaired UE functional use  Visit Diagnosis: Acute pain of left shoulder  Other symptoms and signs involving the musculoskeletal system    Problem List Patient Active Problem List   Diagnosis Date Noted  . Monoclonal gammopathy of unknown significance (MGUS) 11/26/2017  . Memory loss of unknown cause 11/10/2017  . Chronic left shoulder pain 09/14/2017  . Encounter for medication management  08/10/2017  . Encounter for chronic pain management 02/09/2017  . Carpopedal spasm 01/18/2017  . Anemia in chronic renal disease 12/30/2015  . Morbid obesity ([place]) 04/18/2015  . Myasthenia gravis in remission ([place]) 11/24/2014  . Myasthenia gravis ([place]) 11/16/2014  . Vitamin D deficiency 05/24/2014  . Osteopenia 04/26/2014  . Macular hole of left eye 01/19/2014  . Generalized osteoarthritis 05/25/2013  . Esophageal dysphagia 04/03/2012  . Abnormal chest CT 03/02/2012  . Insomnia 01/02/2012  . Essential hypertension, benign 05/30/2011  . Schatzki's ring 05/07/2011  . INGROWN TOENAIL 01/10/2011  .  GLAUCOMA 08/08/2010  . DYSCHROMIA, UNSPECIFIED 08/08/2010  . Chronic renal disease, stage 3, moderately decreased glomerular filtration rate (GFR) between 30-59 mL/min/1.73 square meter (HCC) 09/14/2009  . HIP PAIN, LEFT 06/09/2009  . Urinary incontinence 06/09/2009  . Iron deficiency anemia 10/02/2008  . Hypothyroidism 09/27/2008  . Hyperlipemia 06/30/2008  . Depression with anxiety 06/30/2008  . GERD 06/30/2008  . Obstructive sleep apnea 06/30/2008  . CARPAL TUNNEL SYNDROME 05/19/2008  . SHOULDER PAIN 02/02/2008  . IMPINGEMENT SYNDROME 02/02/2008  . DEGENERATIVE JOINT DISEASE, KNEE 11/24/2007  . Diabetes mellitus, insulin dependent (IDDM), controlled ([place]) 09/09/2007   Guadelupe Sabin, OTR/L  [phone] 01/08/2018, 2:10 PM  [place], [place], [address] Phone: [phone]   Fax:  [phone]  Name: [name] MRN: [id number] Date of Birth: [birth year]

## 2018-01-15 ENCOUNTER — Ambulatory Visit (HOSPITAL_COMMUNITY): Payer: Medicare Other | Admitting: Occupational Therapy

## 2018-01-15 ENCOUNTER — Telehealth (HOSPITAL_COMMUNITY): Payer: Self-pay | Admitting: Family Medicine

## 2018-01-15 NOTE — Telephone Encounter (Signed)
01/15/18  pt left a message to cx said she was barely walking because of her knee where she fell

## 2018-01-17 ENCOUNTER — Ambulatory Visit (HOSPITAL_COMMUNITY): Payer: Medicare Other | Admitting: Occupational Therapy

## 2018-01-17 ENCOUNTER — Telehealth (HOSPITAL_COMMUNITY): Payer: Self-pay | Admitting: Family Medicine

## 2018-01-17 NOTE — Telephone Encounter (Signed)
01/17/18  pt cx - she left a message that she was trying to get here but just can't make it in

## 2018-01-18 ENCOUNTER — Encounter: Payer: Self-pay | Admitting: Family Medicine

## 2018-01-18 NOTE — Assessment & Plan Note (Signed)
High fall risk,  Home safety reviewed briefly

## 2018-01-18 NOTE — Assessment & Plan Note (Signed)
[name] is reminded of the importance of commitment to daily physical activity for 30 minutes or more, as able and the need to limit carbohydrate intake to 30 to 60 grams per meal to help with blood sugar control.   The need to take medication as prescribed, test blood sugar as directed, and to call between visits if there is a concern that blood sugar is uncontrolled is also discussed.   [name] is reminded of the importance of daily foot exam, annual eye examination, and good blood sugar, blood pressure and cholesterol control.Controlled, no change in medication   Diabetic Labs Latest Ref Rng & Units 11/26/2017 09/13/2017 05/07/2017 05/06/2017 03/25/2017  HbA1c <5.7 % of total Hgb - 6.5(H) - 6.8(H) -  Microalbumin Not Estab. ug/mL - - - - -  Micro/Creat Ratio 0.0 - 30.0 mg/g creat - - - - -  Chol <200 mg/dL - 160 - 223(H) -  HDL >50 mg/dL - 47(L) - 44(L) -  Calc LDL <100 mg/dL - - - 150(H) -  Triglycerides <150 mg/dL - 117 - 143 -  Creatinine 0.44 - 1.00 mg/dL 0.99 0.87 1.12(H) 1.01(H) 0.87   BP/Weight 01/07/2018 11/26/2017 11/06/2017 09/11/2017 08/06/2017 7/54/4920 1/0/0712  Systolic BP 197 588 325 498 264 158 309  Diastolic BP 80 50 82 80 60 83 61  Wt. (Lbs) 241 242.8 257 249.75 241.25 250 251.2  BMI 38.9 39.19 41.48 40.31 38.94 40.35 41.8   Foot/eye exam completion dates Latest Ref Rng & Units 09/11/2017 02/18/2017  Eye Exam No Retinopathy - Retinopathy(A)  Foot Form Completion - Done -

## 2018-01-18 NOTE — Assessment & Plan Note (Signed)
Improved following recent therapy

## 2018-01-18 NOTE — Assessment & Plan Note (Signed)
The patient's Controlled Substance registry is reviewed and compliance confirmed. Adequacy of  Pain control and level of function is assessed. Medication dosing is adjusted as deemed appropriate. Twelve weeks of medication is prescribed , patient signs for the script and is provided with a follow up appointment between 11 to 12 weeks .  

## 2018-01-18 NOTE — Assessment & Plan Note (Signed)
Controlled, no change in medication DASH diet and commitment to daily physical activity for a minimum of 30 minutes discussed and encouraged, as a part of hypertension management. The importance of attaining a healthy weight is also discussed.  BP/Weight 01/07/2018 11/26/2017 11/06/2017 09/11/2017 08/06/2017 03/29/1915 6/0/6004  Systolic BP 599 774 142 395 320 233 435  Diastolic BP 80 50 82 80 60 83 61  Wt. (Lbs) 241 242.8 257 249.75 241.25 250 251.2  BMI 38.9 39.19 41.48 40.31 38.94 40.35 41.8

## 2018-01-18 NOTE — Assessment & Plan Note (Signed)
Improved, now going out more and enjoying being in company, noi med change

## 2018-01-18 NOTE — Progress Notes (Signed)
[name]     MRN: [id number]      DOB: [birth year]   HPI [name] is here for follow up and re-evaluation of chronic medical conditions, medication management and review of any available recent lab and radiology data.  Preventive health is updated, specifically  Cancer screening and Immunization.    The PT denies any adverse reactions to current medications since the last visit.  There are no new concerns.  There are no specific complaints  Denies polyuria, polydipsia, blurred vision , or hypoglycemic episodes.  ROS Denies recent fever or chills. Denies sinus pressure, nasal congestion, ear pain or sore throat. Denies chest congestion, productive cough or wheezing. Denies chest pains, palpitations and leg swelling Denies abdominal pain, nausea, vomiting,diarrhea or constipation.   Denies dysuria, frequency, hesitancy or incontinence. C/o chronicc joint pain,  and limitation in mobility. Denies headaches, seizures, numbness, or tingling. Denies uncontrolled  depression, anxiety or insomnia. Denies skin break down or rash.   PE  BP 134/80   Pulse 69   Resp 16   Ht 5\' 6"  (1.676 m)   Wt 241 lb (109.3 kg)   SpO2 96%   BMI 38.90 kg/m   Patient alert and oriented and in no cardiopulmonary distress.  HEENT: No facial asymmetry, EOMI,   oropharynx pink and moist.  Neck supple no JVD, no mass.  Chest: Clear to auscultation bilaterally.  CVS: S1, S2 no murmurs, no S3.Regular rate.  ABD: Soft non tender.   Ext: No edema  UK:GURKYHCWC  ROM spine, shoulders, hips and knees.  Skin: Intact, no ulcerations or rash noted.  Psych: Good eye contact, normal affect. Memory intact not anxious or depressed appearing.  CNS: CN 2-12 intact, power,  normal throughout.no focal deficits noted.   Assessment & Plan  Encounter for chronic pain management The patient's Controlled Substance registry is reviewed and compliance confirmed. Adequacy of  Pain control and level of  function is assessed. Medication dosing is adjusted as deemed appropriate. Twelve weeks of medication is prescribed , patient signs for the script and is provided with a follow up appointment between 11 to 12 weeks .   Diabetes mellitus, insulin dependent (IDDM), controlled ([place]) [name] is reminded of the importance of commitment to daily physical activity for 30 minutes or more, as able and the need to limit carbohydrate intake to 30 to 60 grams per meal to help with blood sugar control.   The need to take medication as prescribed, test blood sugar as directed, and to call between visits if there is a concern that blood sugar is uncontrolled is also discussed.   [name] is reminded of the importance of daily foot exam, annual eye examination, and good blood sugar, blood pressure and cholesterol control.Controlled, no change in medication   Diabetic Labs Latest Ref Rng & Units 11/26/2017 09/13/2017 05/07/2017 05/06/2017 03/25/2017  HbA1c <5.7 % of total Hgb - 6.5(H) - 6.8(H) -  Microalbumin Not Estab. ug/mL - - - - -  Micro/Creat Ratio 0.0 - 30.0 mg/g creat - - - - -  Chol <200 mg/dL - 160 - 223(H) -  HDL >50 mg/dL - 47(L) - 44(L) -  Calc LDL <100 mg/dL - - - 150(H) -  Triglycerides <150 mg/dL - 117 - 143 -  Creatinine 0.44 - 1.00 mg/dL 0.99 0.87 1.12(H) 1.01(H) 0.87   BP/Weight 01/07/2018 11/26/2017 11/06/2017 09/11/2017 08/06/2017 3/76/2831 05/01/7615  Systolic BP 073 710 626 948 546 270 350  Diastolic BP 80  50 82 80 60 83 61  Wt. (Lbs) 241 242.8 257 249.75 241.25 250 251.2  BMI 38.9 39.19 41.48 40.31 38.94 40.35 41.8   Foot/eye exam completion dates Latest Ref Rng & Units 09/11/2017 02/18/2017  Eye Exam No Retinopathy - Retinopathy(A)  Foot Form Completion - Done -        Chronic left shoulder pain Improved following recent therapy  Depression with anxiety Improved, now going out more and enjoying being in company, noi med change  Essential hypertension, benign Controlled,  no change in medication DASH diet and commitment to daily physical activity for a minimum of 30 minutes discussed and encouraged, as a part of hypertension management. The importance of attaining a healthy weight is also discussed.  BP/Weight 01/07/2018 11/26/2017 11/06/2017 09/11/2017 08/06/2017 09/24/4627 06/02/8176  Systolic BP 116 579 038 333 832 919 166  Diastolic BP 80 50 82 80 60 83 61  Wt. (Lbs) 241 242.8 257 249.75 241.25 250 251.2  BMI 38.9 39.19 41.48 40.31 38.94 40.35 41.8       Generalized osteoarthritis High fall risk,  Home safety reviewed briefly

## 2018-01-20 ENCOUNTER — Other Ambulatory Visit: Payer: Self-pay | Admitting: Family Medicine

## 2018-01-20 ENCOUNTER — Other Ambulatory Visit: Payer: Self-pay | Admitting: Gastroenterology

## 2018-01-21 ENCOUNTER — Encounter (HOSPITAL_COMMUNITY): Payer: Self-pay | Admitting: Occupational Therapy

## 2018-01-21 ENCOUNTER — Ambulatory Visit (HOSPITAL_COMMUNITY): Payer: Medicare Other | Admitting: Occupational Therapy

## 2018-01-21 DIAGNOSIS — R29898 Other symptoms and signs involving the musculoskeletal system: Secondary | ICD-10-CM

## 2018-01-21 DIAGNOSIS — M25512 Pain in left shoulder: Secondary | ICD-10-CM | POA: Diagnosis not present

## 2018-01-21 NOTE — Therapy (Signed)
[place], [place], [address] Phone: [phone]   Fax:  [phone]  Occupational Therapy Treatment  Patient Details  Name: [name] MRN: [id number] Date of Birth: 04/05/1941 No Data Recorded  Encounter Date: 01/21/2018  OT End of Session - 01/21/18 1510    Visit Number  9    Number of Visits  16    Date for OT Re-Evaluation  02/07/18    Authorization Type  [employer] Medicare-$40 copay    OT Start Time  6962 pt arrived late    OT Stop Time  1430    OT Time Calculation (min)  37 min    Activity Tolerance  Patient tolerated treatment well    Behavior During Therapy  [facility] for tasks assessed/performed       Past Medical History:  Diagnosis Date  . Anemia in chronic renal disease 12/30/2015  . Anxiety   . Arthritis    "all over" (01/19/2014)  . Carpal tunnel syndrome   . Chronic bronchitis ([place])    "got it q yr for awhile; hasn't had it in awhile" (01/19/2014)  . Chronic kidney disease (CKD) stage G3b/A1, moderately decreased glomerular filtration rate (GFR) between 30-44 mL/min/1.73 square meter and albuminuria creatinine ratio less than 30 mg/g (HCC) 09/14/2009   Qualifier: Diagnosis of  By: Moshe Cipro MD, Joycelyn Schmid    . Chronic renal disease, stage 3, moderately decreased glomerular filtration rate (GFR) between 30-59 mL/min/1.73 square meter (HCC) 09/14/2009   Qualifier: Diagnosis of  By: Moshe Cipro MD, Joycelyn Schmid    . Depression   . Diverticulosis 07/2004   Colonscopy Dr Gala Romney  . Esophagitis, erosive 2009  . Exertional shortness of breath   . Gastroesophageal reflux   . XBMWUXLK(440.1)    "usually a couple times/wk" (01/19/2014)  . Heart murmur    saw cardiology In [place], he told her she did not need to come back.  . Hyperlipidemia   . Hypertension   . Impingement syndrome, shoulder   . Iron deficiency anemia   . Mitral regurgitation   . Myasthenia gravis   . Myasthenia gravis in remission ([place]) 11/24/2014   . Obesity   . OSA on CPAP   . Pneumonia 01/2012  . Pulmonary HTN ([place])   . Schatzki's ring    Last EGD w/ dilation 02/08/11, 2009 & 2007  . Seasonal allergies   . Shoulder pain   . Thyroid disease    "used to take RX; they took me off it" (01/19/2014)  . Tobacco abuse   . Type II diabetes mellitus ([place])     Past Surgical History:  Procedure Laterality Date  . [place] VITRECTOMY WITH 20 GAUGE MVR PORT Left 01/19/2014   Procedure: 25 GAUGE PARS PLANA VITRECTOMY WITH 20 GAUGE MVR PORT; MEMBRAME PEEL; SERUM PATCH; LASER TREATMENT; C3F8;  Surgeon: Hayden Pedro, MD;  Location: [facility];  Service: Ophthalmology;  Laterality: Left;  . ABDOMINAL HYSTERECTOMY    . APPENDECTOMY    . CARPAL TUNNEL RELEASE Bilateral   . CATARACT EXTRACTION W/PHACO Left 10/21/2013   Procedure: LEFT CATARACT EXTRACTION PHACO AND INTRAOCULAR LENS PLACEMENT (IOC);  Surgeon: Marylynn Pearson, MD;  Location: [facility];  Service: Ophthalmology;  Laterality: Left;  . CHOLECYSTECTOMY    . COLONOSCOPY  05/08/2012   [id number] and external hemorrhoids; colonic diverticulosis  . ESOPHAGEAL DILATION     "more than 3 times" (01/19/2014)  . ESOPHAGOGASTRODUODENOSCOPY  02/08/11   [place]-Distal esophageal erosion consistent with  mild erosive reflux   esophagitis/ Noncritical Schatzki ring, small hiatal hernia otherwise upper/ gastrointestinal tract appeared unremarkable, status post passage  of a Maloney dilation to biopsy disruption of the ring described  . ESOPHAGOGASTRODUODENOSCOPY  04/2012   2 tandem incomplete distal esophagea rings s/p dilation.   . ESOPHAGOGASTRODUODENOSCOPY N/A 09/16/2014   Procedure: ESOPHAGOGASTRODUODENOSCOPY (EGD);  Surgeon: Daneil Dolin, MD;  [facility];  Service: Endoscopy;  Laterality: N/A;  1200  . EYE SURGERY    . INCONTINENCE SURGERY  08/26/09   [place]  . JOINT REPLACEMENT     right total knee  . MALONEY DILATION N/A 09/16/2014   Procedure: Venia Minks DILATION;  Surgeon: Daneil Dolin, MD;  [facility];  Service: Endoscopy;  Laterality: N/A;  . PARS PLANA VITRECTOMY W/ REPAIR OF MACULAR HOLE Left 01/19/2014  . SAVORY DILATION N/A 09/16/2014   Procedure: SAVORY DILATION;  Surgeon: Daneil Dolin, MD;  [facility];  Service: Endoscopy;  Laterality: N/A;  . TONSILLECTOMY    . TOTAL KNEE ARTHROPLASTY Right 05/13/07   Dr. Aline Brochure    There were no vitals filed for this visit.  Subjective Assessment - 01/21/18 1356    Subjective   S: My shoulder is hurting today.     Currently in Pain?  Yes    Pain Score  6     Pain Location  Shoulder    Pain Orientation  Left    Pain Descriptors / Indicators  Aching;Sore    Pain Type  Acute pain    Pain Radiating Towards  none    Pain Onset  More than a month ago    Pain Frequency  Intermittent    Aggravating Factors   movement, use, falling    Pain Relieving Factors  rest, heat, pain cream    Effect of Pain on Daily Activities  mod effect on ADL completion    Multiple Pain Sites  No         [facility] OT Assessment - 01/21/18 1356      Assessment   Medical Diagnosis  Left shoulder pain      Precautions   Precautions  None               OT Treatments/Exercises (OP) - 01/21/18 1357      Exercises   Exercises  Shoulder      Shoulder Exercises: Supine   Protraction  PROM;5 reps;AROM;12 reps    Horizontal ABduction  PROM;5 reps;AROM;12 reps    External Rotation  PROM;10 reps;AROM;12 reps    Internal Rotation  PROM;5 reps;AROM;12 reps    Flexion  PROM;5 reps;AROM;12 reps    ABduction  PROM;5 reps    ABduction Limitations  unable to complete A/ROM due to pain      Shoulder Exercises: Seated   Protraction  AROM;10 reps    Horizontal ABduction  AROM;10 reps    External Rotation  AROM;10 reps    Internal Rotation  AROM;10 reps    Flexion  AROM;10 reps    Abduction  AROM;10 reps      Shoulder Exercises: ROM/Strengthening   X to V Arms  5X    Proximal Shoulder Strengthening, Seated   10X each 2 rest breaks      Manual Therapy   Manual Therapy  Myofascial release    Manual therapy comments  Completed separately from therapeutic exercises    Myofascial Release  Myofascial release to left upper arm, trapezius, and scapularis regions  to decrease pain and fascial restrictions and increase joint range of motion.                OT Short Term Goals - 01/08/18 1338      OT SHORT TERM GOAL #1   Title  Pt will be provided with and independent in HEP to improve LUE mobility required for daily task completion.     Time  4    Period  Weeks    Status  On-going      OT SHORT TERM GOAL #2   Title  Pt will decrease pain in LUE to 3/10 or less to improve ability to sleep at night.     Time  4    Period  Weeks    Status  On-going      OT SHORT TERM GOAL #3   Title  Pt will increase LUE A/ROM to [facility] to improve ability to donn shirts and jackets.     Time  4    Period  Weeks    Status  On-going      OT SHORT TERM GOAL #4   Title  Pt will decrease LUE fascial restrictions from moderate to minimal amounts to improve mobility required for functional reaching tasks.     Time  4    Period  Weeks    Status  On-going      OT SHORT TERM GOAL #5   Title  Pt will improve LUE strength to 4/5 to improve ability to reach out and close car door.     Time  4    Period  Weeks    Status  On-going               Plan - 01/21/18 1413    Clinical Impression Statement  A: Pt has missed a week of therapy due to a fall and sinus infection. Pt reports she has been trying to do her exercises but she can only do a few at a time. Continued with A/ROM this session, pt requiring increased time due to fatigue. Added x to v arms, verbal and visual cuing for form and technique. Unable to attempt scapular theraband due to time constraints as pt arrived late.     Plan  P: Continue with A/ROM including x to v arms, attempt scapular theraband     Consulted and Agree with Plan of Care  Patient        Patient will benefit from skilled therapeutic intervention in order to improve the following deficits and impairments:  Decreased strength, Decreased range of motion, Pain, Increased fascial restrictions, Impaired UE functional use  Visit Diagnosis: Acute pain of left shoulder  Other symptoms and signs involving the musculoskeletal system    Problem List Patient Active Problem List   Diagnosis Date Noted  . Monoclonal gammopathy of unknown significance (MGUS) 11/26/2017  . Memory loss of unknown cause 11/10/2017  . Chronic left shoulder pain 09/14/2017  . Encounter for medication management 08/10/2017  . Encounter for chronic pain management 02/09/2017  . Carpopedal spasm 01/18/2017  . Anemia in chronic renal disease 12/30/2015  . Morbid obesity ([place]) 04/18/2015  . Myasthenia gravis in remission ([place]) 11/24/2014  . Myasthenia gravis ([place]) 11/16/2014  . Vitamin D deficiency 05/24/2014  . Osteopenia 04/26/2014  . Macular hole of left eye 01/19/2014  . Generalized osteoarthritis 05/25/2013  . Esophageal dysphagia 04/03/2012  . Abnormal chest CT 03/02/2012  . Insomnia 01/02/2012  . Essential hypertension, benign 05/30/2011  .  Schatzki's ring 05/07/2011  . INGROWN TOENAIL 01/10/2011  . GLAUCOMA 08/08/2010  . DYSCHROMIA, UNSPECIFIED 08/08/2010  . Chronic renal disease, stage 3, moderately decreased glomerular filtration rate (GFR) between 30-59 mL/min/1.73 square meter (HCC) 09/14/2009  . HIP PAIN, LEFT 06/09/2009  . Urinary incontinence 06/09/2009  . Iron deficiency anemia 10/02/2008  . Hypothyroidism 09/27/2008  . Hyperlipemia 06/30/2008  . Depression with anxiety 06/30/2008  . GERD 06/30/2008  . Obstructive sleep apnea 06/30/2008  . CARPAL TUNNEL SYNDROME 05/19/2008  . SHOULDER PAIN 02/02/2008  . IMPINGEMENT SYNDROME 02/02/2008  . DEGENERATIVE JOINT DISEASE, KNEE 11/24/2007  . Diabetes mellitus, insulin dependent (IDDM), controlled ([place]) 09/09/2007   Guadelupe Sabin, OTR/L  [phone] 01/21/2018, 3:12 PM  [place], [place], [address] Phone: [phone]   Fax:  [phone]  Name: MERRIN MCVICKER MRN: [id number] Date of Birth: May 19, 1941

## 2018-01-23 ENCOUNTER — Encounter (HOSPITAL_COMMUNITY): Payer: Self-pay | Admitting: Occupational Therapy

## 2018-01-23 ENCOUNTER — Ambulatory Visit (HOSPITAL_COMMUNITY): Payer: Medicare Other | Admitting: Occupational Therapy

## 2018-01-23 ENCOUNTER — Other Ambulatory Visit: Payer: Self-pay | Admitting: Family Medicine

## 2018-01-23 DIAGNOSIS — R29898 Other symptoms and signs involving the musculoskeletal system: Secondary | ICD-10-CM | POA: Diagnosis not present

## 2018-01-23 DIAGNOSIS — M25512 Pain in left shoulder: Secondary | ICD-10-CM | POA: Diagnosis not present

## 2018-01-23 NOTE — Therapy (Signed)
[place], [place], [address] Phone: [phone]   Fax:  [phone]  Occupational Therapy Treatment  Patient Details  Name: [name] MRN: [id number] Date of Birth: Jan 16, 1941 No Data Recorded  Encounter Date: 01/23/2018  OT End of Session - 01/23/18 1351    Visit Number  10    Number of Visits  16    Date for OT Re-Evaluation  02/07/18    Authorization Type  [employer] Medicare-$40 copay    OT Start Time  1308    OT Stop Time  1349    OT Time Calculation (min)  41 min    Activity Tolerance  Patient tolerated treatment well    Behavior During Therapy  [facility] for tasks assessed/performed       Past Medical History:  Diagnosis Date  . Anemia in chronic renal disease 12/30/2015  . Anxiety   . Arthritis    "all over" (01/19/2014)  . Carpal tunnel syndrome   . Chronic bronchitis ([place])    "got it q yr for awhile; hasn't had it in awhile" (01/19/2014)  . Chronic kidney disease (CKD) stage G3b/A1, moderately decreased glomerular filtration rate (GFR) between 30-44 mL/min/1.73 square meter and albuminuria creatinine ratio less than 30 mg/g (HCC) 09/14/2009   Qualifier: Diagnosis of  By: Moshe Cipro MD, Joycelyn Schmid    . Chronic renal disease, stage 3, moderately decreased glomerular filtration rate (GFR) between 30-59 mL/min/1.73 square meter (HCC) 09/14/2009   Qualifier: Diagnosis of  By: Moshe Cipro MD, Joycelyn Schmid    . Depression   . Diverticulosis 07/2004   Colonscopy Dr Gala Romney  . Esophagitis, erosive 2009  . Exertional shortness of breath   . Gastroesophageal reflux   . EVOJJKKX(381.8)    "usually a couple times/wk" (01/19/2014)  . Heart murmur    saw cardiology In [place], he told her she did not need to come back.  . Hyperlipidemia   . Hypertension   . Impingement syndrome, shoulder   . Iron deficiency anemia   . Mitral regurgitation   . Myasthenia gravis   . Myasthenia gravis in remission ([place]) 11/24/2014  . Obesity    . OSA on CPAP   . Pneumonia 01/2012  . Pulmonary HTN ([place])   . Schatzki's ring    Last EGD w/ dilation 02/08/11, 2009 & 2007  . Seasonal allergies   . Shoulder pain   . Thyroid disease    "used to take RX; they took me off it" (01/19/2014)  . Tobacco abuse   . Type II diabetes mellitus ([place])     Past Surgical History:  Procedure Laterality Date  . [place] VITRECTOMY WITH 20 GAUGE MVR PORT Left 01/19/2014   Procedure: 25 GAUGE PARS PLANA VITRECTOMY WITH 20 GAUGE MVR PORT; MEMBRAME PEEL; SERUM PATCH; LASER TREATMENT; C3F8;  Surgeon: Hayden Pedro, MD;  Location: [facility];  Service: Ophthalmology;  Laterality: Left;  . ABDOMINAL HYSTERECTOMY    . APPENDECTOMY    . CARPAL TUNNEL RELEASE Bilateral   . CATARACT EXTRACTION W/PHACO Left 10/21/2013   Procedure: LEFT CATARACT EXTRACTION PHACO AND INTRAOCULAR LENS PLACEMENT (IOC);  Surgeon: Marylynn Pearson, MD;  Location: [facility];  Service: Ophthalmology;  Laterality: Left;  . CHOLECYSTECTOMY    . COLONOSCOPY  05/08/2012   [id number] and external hemorrhoids; colonic diverticulosis  . ESOPHAGEAL DILATION     "more than 3 times" (01/19/2014)  . ESOPHAGOGASTRODUODENOSCOPY  02/08/11   Rourk-Distal esophageal erosion consistent with mild erosive reflux  esophagitis/ Noncritical Schatzki ring, small hiatal hernia otherwise upper/ gastrointestinal tract appeared unremarkable, status post passage  of a Maloney dilation to biopsy disruption of the ring described  . ESOPHAGOGASTRODUODENOSCOPY  04/2012   2 tandem incomplete distal esophagea rings s/p dilation.   . ESOPHAGOGASTRODUODENOSCOPY N/A 09/16/2014   Procedure: ESOPHAGOGASTRODUODENOSCOPY (EGD);  Surgeon: Daneil Dolin, MD;  [facility];  Service: Endoscopy;  Laterality: N/A;  1200  . EYE SURGERY    . INCONTINENCE SURGERY  08/26/09   [place]  . JOINT REPLACEMENT     right total knee  . MALONEY DILATION N/A 09/16/2014   Procedure: Venia Minks DILATION;  Surgeon: Daneil Dolin, MD;   [facility];  Service: Endoscopy;  Laterality: N/A;  . PARS PLANA VITRECTOMY W/ REPAIR OF MACULAR HOLE Left 01/19/2014  . SAVORY DILATION N/A 09/16/2014   Procedure: SAVORY DILATION;  Surgeon: Daneil Dolin, MD;  [facility];  Service: Endoscopy;  Laterality: N/A;  . TONSILLECTOMY    . TOTAL KNEE ARTHROPLASTY Right 05/13/07   Dr. Aline Brochure    There were no vitals filed for this visit.  Subjective Assessment - 01/23/18 1310    Subjective   S: It's getting better.     Currently in Pain?  Yes    Pain Score  5     Pain Location  Shoulder    Pain Orientation  Left    Pain Descriptors / Indicators  Aching;Sore    Pain Type  Acute pain    Pain Radiating Towards  none    Pain Onset  More than a month ago    Pain Frequency  Intermittent    Aggravating Factors   movement, use, falling    Pain Relieving Factors  rest, heat, pain cream    Effect of Pain on Daily Activities  mod effect on ADL completion    Multiple Pain Sites  No         [facility] OT Assessment - 01/23/18 1309      Assessment   Medical Diagnosis  Left shoulder pain      Precautions   Precautions  None               OT Treatments/Exercises (OP) - 01/23/18 1310      Exercises   Exercises  Shoulder      Shoulder Exercises: Supine   Protraction  PROM;5 reps    Horizontal ABduction  PROM;5 reps    External Rotation  PROM;5 reps    Internal Rotation  PROM;5 reps    Flexion  PROM;5 reps    ABduction  PROM;5 reps      Shoulder Exercises: Seated   Extension  Theraband;10 reps    Theraband Level (Shoulder Extension)  Level 2 (Red)    Retraction Limitations  unable to complete with proper form    Row  Theraband;10 reps    Theraband Level (Shoulder Row)  Level 2 (Red)    Row Limitations  verbal cuing for form    Protraction  AROM;10 reps    Horizontal ABduction  AROM;10 reps    External Rotation  AROM;10 reps    Internal Rotation  AROM;10 reps    Flexion  AROM;10 reps    Abduction   AROM;10 reps      Shoulder Exercises: ROM/Strengthening   Wall Wash  1'    X to V Arms  10X    Proximal Shoulder Strengthening, Seated  10X each 1 rest break  Functional Reaching Activities   Mid Level  Pt placed 17 clothespins along vertical bar of pinch tree, working on shoulder flexion with functional reaching      Manual Therapy   Manual Therapy  Myofascial release    Manual therapy comments  Completed separately from therapeutic exercises    Myofascial Release  Myofascial release to left upper arm, trapezius, and scapularis regions to decrease pain and fascial restrictions and increase joint range of motion.                OT Short Term Goals - 01/08/18 1338      OT SHORT TERM GOAL #1   Title  Pt will be provided with and independent in HEP to improve LUE mobility required for daily task completion.     Time  4    Period  Weeks    Status  On-going      OT SHORT TERM GOAL #2   Title  Pt will decrease pain in LUE to 3/10 or less to improve ability to sleep at night.     Time  4    Period  Weeks    Status  On-going      OT SHORT TERM GOAL #3   Title  Pt will increase LUE A/ROM to [facility] to improve ability to donn shirts and jackets.     Time  4    Period  Weeks    Status  On-going      OT SHORT TERM GOAL #4   Title  Pt will decrease LUE fascial restrictions from moderate to minimal amounts to improve mobility required for functional reaching tasks.     Time  4    Period  Weeks    Status  On-going      OT SHORT TERM GOAL #5   Title  Pt will improve LUE strength to 4/5 to improve ability to reach out and close car door.     Time  4    Period  Weeks    Status  On-going               Plan - 01/23/18 1322    Clinical Impression Statement  A: Continued with A/ROM this session, pt requiring increased time due to fatigue, rest breaks provided as needed. Continued scapular theraband this session, pt unable to complete retraction with good form. Continued  with functional reaching with improvement in height during pinch tree task. Verbal cuing for form and technique.     Plan  P: Add UBE, increase A/ROM repetitions       Patient will benefit from skilled therapeutic intervention in order to improve the following deficits and impairments:  Decreased strength, Decreased range of motion, Pain, Increased fascial restrictions, Impaired UE functional use  Visit Diagnosis: Acute pain of left shoulder  Other symptoms and signs involving the musculoskeletal system    Problem List Patient Active Problem List   Diagnosis Date Noted  . Monoclonal gammopathy of unknown significance (MGUS) 11/26/2017  . Memory loss of unknown cause 11/10/2017  . Chronic left shoulder pain 09/14/2017  . Encounter for medication management 08/10/2017  . Encounter for chronic pain management 02/09/2017  . Carpopedal spasm 01/18/2017  . Anemia in chronic renal disease 12/30/2015  . Morbid obesity ([place]) 04/18/2015  . Myasthenia gravis in remission ([place]) 11/24/2014  . Myasthenia gravis ([place]) 11/16/2014  . Vitamin D deficiency 05/24/2014  . Osteopenia 04/26/2014  . Macular hole of left eye 01/19/2014  . Generalized  osteoarthritis 05/25/2013  . Esophageal dysphagia 04/03/2012  . Abnormal chest CT 03/02/2012  . Insomnia 01/02/2012  . Essential hypertension, benign 05/30/2011  . Schatzki's ring 05/07/2011  . INGROWN TOENAIL 01/10/2011  . GLAUCOMA 08/08/2010  . DYSCHROMIA, UNSPECIFIED 08/08/2010  . Chronic renal disease, stage 3, moderately decreased glomerular filtration rate (GFR) between 30-59 mL/min/1.73 square meter (HCC) 09/14/2009  . HIP PAIN, LEFT 06/09/2009  . Urinary incontinence 06/09/2009  . Iron deficiency anemia 10/02/2008  . Hypothyroidism 09/27/2008  . Hyperlipemia 06/30/2008  . Depression with anxiety 06/30/2008  . GERD 06/30/2008  . Obstructive sleep apnea 06/30/2008  . CARPAL TUNNEL SYNDROME 05/19/2008  . SHOULDER PAIN 02/02/2008  .  IMPINGEMENT SYNDROME 02/02/2008  . DEGENERATIVE JOINT DISEASE, KNEE 11/24/2007  . Diabetes mellitus, insulin dependent (IDDM), controlled ([place]) 09/09/2007   Guadelupe Sabin, OTR/L  [phone] 01/23/2018, 1:53 PM  [facility] [address] [place], [place], [address] Phone: [phone]   Fax:  [phone]  Name: [name] MRN: [id number] Date of Birth: 1941/06/29

## 2018-01-29 ENCOUNTER — Ambulatory Visit (HOSPITAL_COMMUNITY): Payer: Medicare Other | Admitting: Occupational Therapy

## 2018-01-29 ENCOUNTER — Encounter (HOSPITAL_COMMUNITY): Payer: Self-pay | Admitting: Occupational Therapy

## 2018-01-29 DIAGNOSIS — R29898 Other symptoms and signs involving the musculoskeletal system: Secondary | ICD-10-CM | POA: Diagnosis not present

## 2018-01-29 DIAGNOSIS — M25512 Pain in left shoulder: Secondary | ICD-10-CM

## 2018-01-29 NOTE — Therapy (Signed)
[facility], [place], [address] Phone: [phone]   Fax:  [phone]  Occupational Therapy Treatment  Patient Details  Name: [name] MRN: [id number] Date of Birth: [birth year] No Data Recorded  Encounter Date: 01/29/2018  OT End of Session - 01/29/18 1405    Visit Number  11    Number of Visits  16    Date for OT Re-Evaluation  02/07/18    Authorization Type  UHC Medicare-$40 copay    OT Start Time  9833 pt arrived late    OT Stop Time  1345    OT Time Calculation (min)  32 min    Activity Tolerance  Patient tolerated treatment well    Behavior During Therapy  [facility] for tasks assessed/performed       Past Medical History:  Diagnosis Date  . Anemia in chronic renal disease 12/30/2015  . Anxiety   . Arthritis    "all over" (01/19/2014)  . Carpal tunnel syndrome   . Chronic bronchitis ([place])    "got it q yr for awhile; hasn't had it in awhile" (01/19/2014)  . Chronic kidney disease (CKD) stage G3b/A1, moderately decreased glomerular filtration rate (GFR) between 30-44 mL/min/1.73 square meter and albuminuria creatinine ratio less than 30 mg/g (HCC) 09/14/2009   Qualifier: Diagnosis of  By: Moshe Cipro MD, Joycelyn Schmid    . Chronic renal disease, stage 3, moderately decreased glomerular filtration rate (GFR) between 30-59 mL/min/1.73 square meter (HCC) 09/14/2009   Qualifier: Diagnosis of  By: Moshe Cipro MD, Joycelyn Schmid    . Depression   . Diverticulosis 07/2004   Colonscopy Dr Gala Romney  . Esophagitis, erosive 2009  . Exertional shortness of breath   . Gastroesophageal reflux   . ASNKNLZJ(673.4)    "usually a couple times/wk" (01/19/2014)  . Heart murmur    saw cardiology In [place], he told her she did not need to come back.  . Hyperlipidemia   . Hypertension   . Impingement syndrome, shoulder   . Iron deficiency anemia   . Mitral regurgitation   . Myasthenia gravis   . Myasthenia gravis in remission ([place])  11/24/2014  . Obesity   . OSA on CPAP   . Pneumonia 01/2012  . Pulmonary HTN ([place])   . Schatzki's ring    Last EGD w/ dilation 02/08/11, 2009 & 2007  . Seasonal allergies   . Shoulder pain   . Thyroid disease    "used to take RX; they took me off it" (01/19/2014)  . Tobacco abuse   . Type II diabetes mellitus ([place])     Past Surgical History:  Procedure Laterality Date  . [place] VITRECTOMY WITH 20 GAUGE MVR PORT Left 01/19/2014   Procedure: 25 GAUGE PARS PLANA VITRECTOMY WITH 20 GAUGE MVR PORT; MEMBRAME PEEL; SERUM PATCH; LASER TREATMENT; C3F8;  Surgeon: Hayden Pedro, MD;  Location: [facility];  Service: Ophthalmology;  Laterality: Left;  . ABDOMINAL HYSTERECTOMY    . APPENDECTOMY    . CARPAL TUNNEL RELEASE Bilateral   . CATARACT EXTRACTION W/PHACO Left 10/21/2013   Procedure: LEFT CATARACT EXTRACTION PHACO AND INTRAOCULAR LENS PLACEMENT (IOC);  Surgeon: Marylynn Pearson, MD;  Location: [facility];  Service: Ophthalmology;  Laterality: Left;  . CHOLECYSTECTOMY    . COLONOSCOPY  05/08/2012   [id number] and external hemorrhoids; colonic diverticulosis  . ESOPHAGEAL DILATION     "more than 3 times" (01/19/2014)  . ESOPHAGOGASTRODUODENOSCOPY  02/08/11   Rourk-Distal esophageal erosion consistent with  mild erosive reflux   esophagitis/ Noncritical Schatzki ring, small hiatal hernia otherwise upper/ gastrointestinal tract appeared unremarkable, status post passage  of a Maloney dilation to biopsy disruption of the ring described  . ESOPHAGOGASTRODUODENOSCOPY  04/2012   2 tandem incomplete distal esophagea rings s/p dilation.   . ESOPHAGOGASTRODUODENOSCOPY N/A 09/16/2014   Procedure: ESOPHAGOGASTRODUODENOSCOPY (EGD);  Surgeon: Daneil Dolin, MD;  [facility];  Service: Endoscopy;  Laterality: N/A;  1200  . EYE SURGERY    . INCONTINENCE SURGERY  08/26/09   [place]  . JOINT REPLACEMENT     right total knee  . MALONEY DILATION N/A 09/16/2014   Procedure: Venia Minks DILATION;   Surgeon: Daneil Dolin, MD;  [facility];  Service: Endoscopy;  Laterality: N/A;  . PARS PLA[place] VITRECTOMY W/ REPAIR OF MACULAR HOLE Left 01/19/2014  . SAVORY DILATION N/A 09/16/2014   Procedure: SAVORY DILATION;  Surgeon: Daneil Dolin, MD;  [facility];  Service: Endoscopy;  Laterality: N/A;  . TONSILLECTOMY    . TOTAL KNEE ARTHROPLASTY Right 05/13/07   Dr. Aline Brochure    There were no vitals filed for this visit.  Subjective Assessment - 01/29/18 1315    Subjective   S: It feels some better today.     Currently in Pain?  Yes    Pain Score  4     Pain Location  Shoulder    Pain Orientation  Left    Pain Descriptors / Indicators  Aching;Sore    Pain Type  Acute pain    Pain Radiating Towards  none    Pain Onset  More than a month ago    Pain Frequency  Intermittent    Aggravating Factors   movement, use, falling    Pain Relieving Factors  rest, heat, pain cream    Effect of Pain on Daily Activities  mod effect on ADL completion    Multiple Pain Sites  No         [facility] OT Assessment - 01/29/18 1314      Assessment   Medical Diagnosis  Left shoulder pain      Precautions   Precautions  None               OT Treatments/Exercises (OP) - 01/29/18 1315      Exercises   Exercises  Shoulder      Shoulder Exercises: Supine   Protraction  PROM;5 reps;AROM;12 reps    Horizontal ABduction  PROM;5 reps;AROM;12 reps    External Rotation  PROM;5 reps;AROM;12 reps    Internal Rotation  PROM;5 reps;AROM;12 reps    Flexion  PROM;5 reps;AROM;12 reps    ABduction  PROM;5 reps;AROM;12 reps      Shoulder Exercises: Seated   Protraction  AROM;12 reps    Horizontal ABduction  AROM;12 reps    External Rotation  AROM;12 reps    Internal Rotation  AROM;12 reps    Flexion  AROM;12 reps    Abduction  AROM;12 reps      Shoulder Exercises: ROM/Strengthening   Over Head Lace  1'    X to V Arms  10X    Proximal Shoulder Strengthening, Supine  10X each no  rest breaks; verbal cuing    Proximal Shoulder Strengthening, Seated  10X each 1 rest break               OT Short Term Goals - 01/08/18 1338      OT SHORT TERM GOAL #1  Title  Pt will be provided with and independent in HEP to improve LUE mobility required for daily task completion.     Time  4    Period  Weeks    Status  On-going      OT SHORT TERM GOAL #2   Title  Pt will decrease pain in LUE to 3/10 or less to improve ability to sleep at night.     Time  4    Period  Weeks    Status  On-going      OT SHORT TERM GOAL #3   Title  Pt will increase LUE A/ROM to [facility] to improve ability to donn shirts and jackets.     Time  4    Period  Weeks    Status  On-going      OT SHORT TERM GOAL #4   Title  Pt will decrease LUE fascial restrictions from moderate to minimal amounts to improve mobility required for functional reaching tasks.     Time  4    Period  Weeks    Status  On-going      OT SHORT TERM GOAL #5   Title  Pt will improve LUE strength to 4/5 to improve ability to reach out and close car door.     Time  4    Period  Weeks    Status  On-going               Plan - 01/29/18 1320    Clinical Impression Statement  A: Pt arrived late to session, no manual therapy completed. Continued with A/ROM in supine and sitting, pt achieving ROM WFL, verbal cuing for form and technique throughout session. Added overhead lacing, cuing for technique. Did not add UBE due to time constraints. Pt reports she completes her HEP and that it is easier when she is lying flat on her bed.     Plan  P: Add UBE if time allows, resume scapular theraband       Patient will benefit from skilled therapeutic intervention in order to improve the following deficits and impairments:  Decreased strength, Decreased range of motion, Pain, Increased fascial restrictions, Impaired UE functional use  Visit Diagnosis: Acute pain of left shoulder  Other symptoms and signs involving the  musculoskeletal system    Problem List Patient Active Problem List   Diagnosis Date Noted  . Monoclonal gammopathy of unknown significance (MGUS) 11/26/2017  . Memory loss of unknown cause 11/10/2017  . Chronic left shoulder pain 09/14/2017  . Encounter for medication management 08/10/2017  . Encounter for chronic pain management 02/09/2017  . Carpopedal spasm 01/18/2017  . Anemia in chronic renal disease 12/30/2015  . Morbid obesity ([place]) 04/18/2015  . Myasthenia gravis in remission ([place]) 11/24/2014  . Myasthenia gravis ([place]) 11/16/2014  . Vitamin D deficiency 05/24/2014  . Osteopenia 04/26/2014  . Macular hole of left eye 01/19/2014  . Generalized osteoarthritis 05/25/2013  . Esophageal dysphagia 04/03/2012  . Abnormal chest CT 03/02/2012  . Insomnia 01/02/2012  . Essential hypertension, benign 05/30/2011  . Schatzki's ring 05/07/2011  . INGROWN TOENAIL 01/10/2011  . GLAUCOMA 08/08/2010  . DYSCHROMIA, UNSPECIFIED 08/08/2010  . Chronic renal disease, stage 3, moderately decreased glomerular filtration rate (GFR) between 30-59 mL/min/1.73 square meter (HCC) 09/14/2009  . HIP PAIN, LEFT 06/09/2009  . Urinary incontinence 06/09/2009  . Iron deficiency anemia 10/02/2008  . Hypothyroidism 09/27/2008  . Hyperlipemia 06/30/2008  . Depression with anxiety 06/30/2008  . GERD 06/30/2008  .  Obstructive sleep apnea 06/30/2008  . CARPAL TUNNEL SYNDROME 05/19/2008  . SHOULDER PAIN 02/02/2008  . IMPINGEMENT SYNDROME 02/02/2008  . DEGENERATIVE JOINT DISEASE, KNEE 11/24/2007  . Diabetes mellitus, insulin dependent (IDDM), controlled ([place]) 09/09/2007   Guadelupe Sabin, OTR/L  [phone] 01/29/2018, 2:07 PM  [facility] [address] [place], [place], [address] Phone: [phone]   Fax:  [phone]  Name: [name] MRN: [id number] Date of Birth: Feb 25, 1941

## 2018-01-31 ENCOUNTER — Telehealth (HOSPITAL_COMMUNITY): Payer: Self-pay | Admitting: Family Medicine

## 2018-01-31 ENCOUNTER — Ambulatory Visit (HOSPITAL_COMMUNITY): Payer: Medicare Other | Attending: Orthopedic Surgery | Admitting: Occupational Therapy

## 2018-01-31 DIAGNOSIS — M25512 Pain in left shoulder: Secondary | ICD-10-CM | POA: Insufficient documentation

## 2018-01-31 DIAGNOSIS — R29898 Other symptoms and signs involving the musculoskeletal system: Secondary | ICD-10-CM | POA: Insufficient documentation

## 2018-01-31 NOTE — Telephone Encounter (Signed)
01/31/18  Pt returned the call and she thought she had an appt. On Monday didn't know about today.  She said she was sorry.

## 2018-02-04 ENCOUNTER — Encounter (HOSPITAL_COMMUNITY): Payer: Self-pay | Admitting: Occupational Therapy

## 2018-02-04 ENCOUNTER — Ambulatory Visit (HOSPITAL_COMMUNITY): Payer: Medicare Other | Admitting: Occupational Therapy

## 2018-02-04 DIAGNOSIS — M25512 Pain in left shoulder: Secondary | ICD-10-CM

## 2018-02-04 DIAGNOSIS — R29898 Other symptoms and signs involving the musculoskeletal system: Secondary | ICD-10-CM | POA: Diagnosis not present

## 2018-02-04 NOTE — Therapy (Signed)
[place], [place], [address] Phone: [phone]   Fax:  [phone]  Occupational Therapy Treatment  Patient Details  Name: [name] MRN: [id number] Date of Birth: 12/21/1941 No Data Recorded  Encounter Date: 02/04/2018  OT End of Session - 02/04/18 1403    Visit Number  12    Number of Visits  16    Date for OT Re-Evaluation  02/07/18    Authorization Type  [employer] Medicare-$40 copay    OT Start Time  3557    OT Stop Time  1430    OT Time Calculation (min)  41 min    Activity Tolerance  Patient tolerated treatment well    Behavior During Therapy  [facility] for tasks assessed/performed       Past Medical History:  Diagnosis Date  . Anemia in chronic renal disease 12/30/2015  . Anxiety   . Arthritis    "all over" (01/19/2014)  . Carpal tunnel syndrome   . Chronic bronchitis ([place])    "got it q yr for awhile; hasn't had it in awhile" (01/19/2014)  . Chronic kidney disease (CKD) stage G3b/A1, moderately decreased glomerular filtration rate (GFR) between 30-44 mL/min/1.73 square meter and albuminuria creatinine ratio less than 30 mg/g (HCC) 09/14/2009   Qualifier: Diagnosis of  By: Moshe Cipro MD, Joycelyn Schmid    . Chronic renal disease, stage 3, moderately decreased glomerular filtration rate (GFR) between 30-59 mL/min/1.73 square meter (HCC) 09/14/2009   Qualifier: Diagnosis of  By: Moshe Cipro MD, Joycelyn Schmid    . Depression   . Diverticulosis 07/2004   Colonscopy Dr Gala Romney  . Esophagitis, erosive 2009  . Exertional shortness of breath   . Gastroesophageal reflux   . DUKGURKY(706.2)    "usually a couple times/wk" (01/19/2014)  . Heart murmur    saw cardiology In [place], he told her she did not need to come back.  . Hyperlipidemia   . Hypertension   . Impingement syndrome, shoulder   . Iron deficiency anemia   . Mitral regurgitation   . Myasthenia gravis   . Myasthenia gravis in remission ([place]) 11/24/2014  . Obesity    . OSA on CPAP   . Pneumonia 01/2012  . Pulmonary HTN ([place])   . Schatzki's ring    Last EGD w/ dilation 02/08/11, 2009 & 2007  . Seasonal allergies   . Shoulder pain   . Thyroid disease    "used to take RX; they took me off it" (01/19/2014)  . Tobacco abuse   . Type II diabetes mellitus ([place])     Past Surgical History:  Procedure Laterality Date  . [place] VITRECTOMY WITH 20 GAUGE MVR PORT Left 01/19/2014   Procedure: 25 GAUGE PARS PLANA VITRECTOMY WITH 20 GAUGE MVR PORT; MEMBRAME PEEL; SERUM PATCH; LASER TREATMENT; C3F8;  Surgeon: Hayden Pedro, MD;  Location: [facility];  Service: Ophthalmology;  Laterality: Left;  . ABDOMINAL HYSTERECTOMY    . APPENDECTOMY    . CARPAL TUNNEL RELEASE Bilateral   . CATARACT EXTRACTION W/PHACO Left 10/21/2013   Procedure: LEFT CATARACT EXTRACTION PHACO AND INTRAOCULAR LENS PLACEMENT (IOC);  Surgeon: Marylynn Pearson, MD;  Location: [facility];  Service: Ophthalmology;  Laterality: Left;  . CHOLECYSTECTOMY    . COLONOSCOPY  05/08/2012   BJS:EGBTDVVO and external hemorrhoids; colonic diverticulosis  . ESOPHAGEAL DILATION     "more than 3 times" (01/19/2014)  . ESOPHAGOGASTRODUODENOSCOPY  02/08/11   Rourk-Distal esophageal erosion consistent with mild erosive reflux  esophagitis/ Noncritical Schatzki ring, small hiatal hernia otherwise upper/ gastrointestinal tract appeared unremarkable, status post passage  of a Maloney dilation to biopsy disruption of the ring described  . ESOPHAGOGASTRODUODENOSCOPY  04/2012   2 tandem incomplete distal esophagea rings s/p dilation.   . ESOPHAGOGASTRODUODENOSCOPY N/A 09/16/2014   Procedure: ESOPHAGOGASTRODUODENOSCOPY (EGD);  Surgeon: Daneil Dolin, MD;  [facility];  Service: Endoscopy;  Laterality: N/A;  1200  . EYE SURGERY    . INCONTINENCE SURGERY  08/26/09   Tananbaum  . JOINT REPLACEMENT     right total knee  . MALONEY DILATION N/A 09/16/2014   Procedure: Venia Minks DILATION;  Surgeon: Daneil Dolin, MD;   [facility];  Service: Endoscopy;  Laterality: N/A;  . PARS PLANA VITRECTOMY W/ REPAIR OF MACULAR HOLE Left 01/19/2014  . SAVORY DILATION N/A 09/16/2014   Procedure: SAVORY DILATION;  Surgeon: Daneil Dolin, MD;  [facility];  Service: Endoscopy;  Laterality: N/A;  . TONSILLECTOMY    . TOTAL KNEE ARTHROPLASTY Right 05/13/07   Dr. Aline Brochure    There were no vitals filed for this visit.  Subjective Assessment - 02/04/18 1352    Subjective   S: I fell yesterday and hit my head.     Currently in Pain?  No/denies         [facility] OT Assessment - 02/04/18 1351      Assessment   Medical Diagnosis  Left shoulder pain      Precautions   Precautions  None               OT Treatments/Exercises (OP) - 02/04/18 1352      Exercises   Exercises  Shoulder      Shoulder Exercises: Supine   Protraction  PROM;5 reps;AROM;12 reps    Horizontal ABduction  PROM;5 reps;AROM;12 reps    External Rotation  PROM;5 reps;AROM;12 reps    Internal Rotation  PROM;5 reps;AROM;12 reps    Flexion  PROM;5 reps;AROM;12 reps    ABduction  PROM;5 reps;AROM;12 reps      Shoulder Exercises: Seated   Extension  Theraband;10 reps    Theraband Level (Shoulder Extension)  Level 2 (Red)    Row  Theraband;10 reps    Theraband Level (Shoulder Row)  Level 2 (Red)    Protraction  AROM;12 reps    Horizontal ABduction  AROM;12 reps    External Rotation  AROM;12 reps    Internal Rotation  AROM;12 reps    Flexion  AROM;12 reps    Abduction  AROM;12 reps      Shoulder Exercises: ROM/Strengthening   Over Head Lace  1'    X to V Arms  10X    Proximal Shoulder Strengthening, Supine  10X each no rest breaks; verbal cuing    Proximal Shoulder Strengthening, Seated  10X each 1 rest break      Manual Therapy   Manual Therapy  Myofascial release    Manual therapy comments  Completed separately from therapeutic exercises    Myofascial Release  Myofascial release to left upper arm, trapezius,  and scapularis regions to decrease pain and fascial restrictions and increase joint range of motion.                OT Short Term Goals - 01/08/18 1338      OT SHORT TERM GOAL #1   Title  Pt will be provided with and independent in HEP to improve LUE mobility required for daily task  completion.     Time  4    Period  Weeks    Status  On-going      OT SHORT TERM GOAL #2   Title  Pt will decrease pain in LUE to 3/10 or less to improve ability to sleep at night.     Time  4    Period  Weeks    Status  On-going      OT SHORT TERM GOAL #3   Title  Pt will increase LUE A/ROM to [facility] to improve ability to donn shirts and jackets.     Time  4    Period  Weeks    Status  On-going      OT SHORT TERM GOAL #4   Title  Pt will decrease LUE fascial restrictions from moderate to minimal amounts to improve mobility required for functional reaching tasks.     Time  4    Period  Weeks    Status  On-going      OT SHORT TERM GOAL #5   Title  Pt will improve LUE strength to 4/5 to improve ability to reach out and close car door.     Time  4    Period  Weeks    Status  On-going               Plan - 02/04/18 1402    Clinical Impression Statement  A: Pt reports a fall yesterday, OT notes increased fascial restrictions and stiffness around left trapezius and cervical neck regions. Pt reports she is completing her exercises at home and they have been easier over the weekend. Pt continues to require increased time for exercise completion. Resumed scapular theraband this session with exception of retraction, verbal cuing for form and technique.     Plan  P: Reassess and discharge; FOTO. Follow up with pt on PT referral for balance/falls       Patient will benefit from skilled therapeutic intervention in order to improve the following deficits and impairments:  Decreased strength, Decreased range of motion, Pain, Increased fascial restrictions, Impaired UE functional use  Visit  Diagnosis: Acute pain of left shoulder  Other symptoms and signs involving the musculoskeletal system    Problem List Patient Active Problem List   Diagnosis Date Noted  . Monoclonal gammopathy of unknown significance (MGUS) 11/26/2017  . Memory loss of unknown cause 11/10/2017  . Chronic left shoulder pain 09/14/2017  . Encounter for medication management 08/10/2017  . Encounter for chronic pain management 02/09/2017  . Carpopedal spasm 01/18/2017  . Anemia in chronic renal disease 12/30/2015  . Morbid obesity ([place]) 04/18/2015  . Myasthenia gravis in remission ([place]) 11/24/2014  . Myasthenia gravis ([place]) 11/16/2014  . Vitamin D deficiency 05/24/2014  . Osteopenia 04/26/2014  . Macular hole of left eye 01/19/2014  . Generalized osteoarthritis 05/25/2013  . Esophageal dysphagia 04/03/2012  . Abnormal chest CT 03/02/2012  . Insomnia 01/02/2012  . Essential hypertension, benign 05/30/2011  . Schatzki's ring 05/07/2011  . INGROWN TOENAIL 01/10/2011  . GLAUCOMA 08/08/2010  . DYSCHROMIA, UNSPECIFIED 08/08/2010  . Chronic renal disease, stage 3, moderately decreased glomerular filtration rate (GFR) between 30-59 mL/min/1.73 square meter (HCC) 09/14/2009  . HIP PAIN, LEFT 06/09/2009  . Urinary incontinence 06/09/2009  . Iron deficiency anemia 10/02/2008  . Hypothyroidism 09/27/2008  . Hyperlipemia 06/30/2008  . Depression with anxiety 06/30/2008  . GERD 06/30/2008  . Obstructive sleep apnea 06/30/2008  . CARPAL TUNNEL SYNDROME 05/19/2008  . SHOULDER  PAIN 02/02/2008  . IMPINGEMENT SYNDROME 02/02/2008  . DEGENERATIVE JOINT DISEASE, KNEE 11/24/2007  . Diabetes mellitus, insulin dependent (IDDM), controlled ([place]) 09/09/2007   Guadelupe Sabin, OTR/L  [phone] 02/04/2018, 2:49 PM  [place], [place], [address] Phone: [phone]   Fax:  [phone]  Name: [name] MRN: [id number] Date of Birth:  07-23-1941

## 2018-02-06 ENCOUNTER — Ambulatory Visit (HOSPITAL_COMMUNITY): Payer: Medicare Other | Admitting: Occupational Therapy

## 2018-02-06 ENCOUNTER — Encounter (HOSPITAL_COMMUNITY): Payer: Self-pay | Admitting: Occupational Therapy

## 2018-02-06 DIAGNOSIS — M25512 Pain in left shoulder: Secondary | ICD-10-CM | POA: Diagnosis not present

## 2018-02-06 DIAGNOSIS — R29898 Other symptoms and signs involving the musculoskeletal system: Secondary | ICD-10-CM

## 2018-02-06 NOTE — Patient Instructions (Signed)
Extension / Flexion (Assist)   Face anchor, pull arms back, keeping elbow straight, and squeze shoulder blades together. Repeat 10-15 times. 1-3 times/day.   Copyright  VHI. All rights reserved.   Row - Bilateral (Anchor)   Facing anchor, arms reaching forward, pull hands toward stomach, keeping elbows bent and at your sides and pinching shoulder blades together. Repeat 10-15 times. 1-3 times/day.   Copyright  VHI. All rights reserved.

## 2018-02-06 NOTE — Therapy (Signed)
[place], [place], [address] Phone: [phone]   Fax:  [phone]  Occupational Therapy Reassessment, Treatment, and Discharge  Patient Details  Name: [name] MRN: [id number] Date of Birth: March 21, 1941 No Data Recorded  Encounter Date: 02/06/2018  OT End of Session - 02/06/18 1312    Visit Number  13    Number of Visits  16    Date for OT Re-Evaluation  02/07/18    Authorization Type  [employer]HC Medicare-$40 copay    OT Start Time  1303    OT Stop Time  1340    OT Time Calculation (min)  37 min    Activity Tolerance  Patient tolerated treatment well    Behavior During Therapy  [facility] for tasks assessed/performed       Past Medical History:  Diagnosis Date  . Anemia in chronic renal disease 12/30/2015  . Anxiety   . Arthritis    "all over" (01/19/2014)  . Carpal tunnel syndrome   . Chronic bronchitis ([place])    "got it q yr for awhile; hasn't had it in awhile" (01/19/2014)  . Chronic kidney disease (CKD) stage G3b/A1, moderately decreased glomerular filtration rate (GFR) between 30-44 mL/min/1.73 square meter and albuminuria creatinine ratio less than 30 mg/g (HCC) 09/14/2009   Qualifier: Diagnosis of  By: Moshe Cipro MD, Joycelyn Schmid    . Chronic renal disease, stage 3, moderately decreased glomerular filtration rate (GFR) between 30-59 mL/min/1.73 square meter (HCC) 09/14/2009   Qualifier: Diagnosis of  By: Moshe Cipro MD, Joycelyn Schmid    . Depression   . Diverticulosis 07/2004   Colonscopy Dr Gala Romney  . Esophagitis, erosive 2009  . Exertional shortness of breath   . Gastroesophageal reflux   . KJZPHXTA(569.7)    "usually a couple times/wk" (01/19/2014)  . Heart murmur    saw cardiology In [place], he told her she did not need to come back.  . Hyperlipidemia   . Hypertension   . Impingement syndrome, shoulder   . Iron deficiency anemia   . Mitral regurgitation   . Myasthenia gravis   . Myasthenia gravis in remission  ([place]) 11/24/2014  . Obesity   . OSA on CPAP   . Pneumonia 01/2012  . Pulmonary HTN ([place])   . Schatzki's ring    Last EGD w/ dilation 02/08/11, 2009 & 2007  . Seasonal allergies   . Shoulder pain   . Thyroid disease    "used to take RX; they took me off it" (01/19/2014)  . Tobacco abuse   . Type II diabetes mellitus ([place])     Past Surgical History:  Procedure Laterality Date  . [place] VITRECTOMY WITH 20 GAUGE MVR PORT Left 01/19/2014   Procedure: 25 GAUGE PARS PLANA VITRECTOMY WITH 20 GAUGE MVR PORT; MEMBRAME PEEL; SERUM PATCH; LASER TREATMENT; C3F8;  Surgeon: Hayden Pedro, MD;  Location: [facility];  Service: Ophthalmology;  Laterality: Left;  . ABDOMINAL HYSTERECTOMY    . APPENDECTOMY    . CARPAL TUNNEL RELEASE Bilateral   . CATARACT EXTRACTION W/PHACO Left 10/21/2013   Procedure: LEFT CATARACT EXTRACTION PHACO AND INTRAOCULAR LENS PLACEMENT (IOC);  Surgeon: Marylynn Pearson, MD;  Location: [facility];  Service: Ophthalmology;  Laterality: Left;  . CHOLECYSTECTOMY    . COLONOSCOPY  05/08/2012   XYI:AXKPVVZS and external hemorrhoids; colonic diverticulosis  . ESOPHAGEAL DILATION     "more than 3 times" (01/19/2014)  . ESOPHAGOGASTRODUODENOSCOPY  02/08/11   Rourk-Distal esophageal erosion consistent with  mild erosive reflux   esophagitis/ Noncritical Schatzki ring, small hiatal hernia otherwise upper/ gastrointestinal tract appeared unremarkable, status post passage  of a Maloney dilation to biopsy disruption of the ring described  . ESOPHAGOGASTRODUODENOSCOPY  04/2012   2 tandem incomplete distal esophagea rings s/p dilation.   . ESOPHAGOGASTRODUODENOSCOPY N/A 09/16/2014   Procedure: ESOPHAGOGASTRODUODENOSCOPY (EGD);  Surgeon: Daneil Dolin, MD;  [facility];  Service: Endoscopy;  Laterality: N/A;  1200  . EYE SURGERY    . INCONTINENCE SURGERY  08/26/09   [place]ananbaum  . JOINT REPLACEMENT     right total knee  . MALONEY DILATION N/A 09/16/2014   Procedure: Venia Minks DILATION;   Surgeon: Daneil Dolin, MD;  [facility];  Service: Endoscopy;  Laterality: N/A;  . [place] VITRECTOMY W/ REPAIR OF MACULAR HOLE Left 01/19/2014  . SAVORY DILATION N/A 09/16/2014   Procedure: SAVORY DILATION;  Surgeon: Daneil Dolin, MD;  [facility];  Service: Endoscopy;  Laterality: N/A;  . TONSILLECTOMY    . TOTAL KNEE ARTHROPLASTY Right 05/13/07   Dr. Aline Brochure    There were no vitals filed for this visit.  Subjective Assessment - 02/06/18 1305    Subjective   S: I want today to be my last day.     Currently in Pain?  No/denies         [facility] OT Assessment - 02/06/18 1305      Assessment   Medical Diagnosis  Left shoulder pain      Precautions   Precautions  None      Observation/Other Assessments   Focus on Therapeutic Outcomes (FOTO)   64/100 (36% impairment)      Palpation   Palpation comment  Minimal fascial restrictions in left upper arm, trapezius, and scapularis regions      AROM   Overall AROM Comments  Assessed seated, er/IR adducted    AROM Assessment Site  Shoulder    Right/Left Shoulder  Left    Left Shoulder Flexion  135 Degrees 116 previous    Left Shoulder ABduction  115 Degrees 101 previous    Left Shoulder Internal Rotation  90 Degrees same as previous    Left Shoulder External Rotation  54 Degrees 20 previous      PROM   Overall PROM Comments  Assessed supine, er/IR adducted    PROM Assessment Site  Shoulder    Right/Left Shoulder  Left    Left Shoulder Flexion  164 Degrees 156 previous    Left Shoulder ABduction  160 Degrees 158 previous    Left Shoulder Internal Rotation  90 Degrees same as previous    Left Shoulder External Rotation  72 Degrees 65 previous      Strength   Overall Strength Comments  Assessed seated, er/IR adducted    Strength Assessment Site  Shoulder    Right/Left Shoulder  Left    Left Shoulder Flexion  4-/5 3+/5 previous    Left Shoulder ABduction  4-/5 3+/5 previous    Left Shoulder Internal  Rotation  4/5 4-/5 previous    Left Shoulder External Rotation  4-/5 3/5 previous               OT Treatments/Exercises (OP) - 02/06/18 1306      Exercises   Exercises  Shoulder      Shoulder Exercises: Supine   Protraction  AROM;15 reps    Horizontal ABduction  AROM;15 reps    External Rotation  AROM;15  reps    Internal Rotation  AROM;15 reps    Flexion  AROM;15 reps    ABduction  AROM;15 reps      Shoulder Exercises: Seated   Extension  Theraband;10 reps    Theraband Level (Shoulder Extension)  Level 2 (Red)    Row  Theraband;10 reps    Theraband Level (Shoulder Row)  Level 2 (Red)    Protraction  AROM;12 reps    Horizontal ABduction  AROM;12 reps    External Rotation  AROM;12 reps    Internal Rotation  AROM;12 reps    Flexion  AROM;12 reps    Abduction  AROM;12 reps             OT Education - 02/06/18 1326    Education provided  Yes    Education Details  scapular theraband extension and row    Person(s) Educated  Patient    Methods  Explanation;Demonstration;Handout    Comprehension  Verbalized understanding;Returned demonstration       OT Short Term Goals - 02/06/18 1312      OT SHORT TERM GOAL #1   Title  Pt will be provided with and independent in HEP to improve LUE mobility required for daily task completion.     Time  4    Period  Weeks    Status  Achieved      OT SHORT TERM GOAL #2   Title  Pt will decrease pain in LUE to 3/10 or less to improve ability to sleep at night.     Time  4    Period  Weeks    Status  Achieved      OT SHORT TERM GOAL #3   Title  Pt will increase LUE A/ROM to [facility] to improve ability to donn shirts and jackets.     Time  4    Period  Weeks    Status  Achieved      OT SHORT TERM GOAL #4   Title  Pt will decrease LUE fascial restrictions from moderate to minimal amounts to improve mobility required for functional reaching tasks.     Time  4    Period  Weeks    Status  Achieved      OT SHORT TERM GOAL #5    Title  Pt will improve LUE strength to 4/5 to improve ability to reach out and close car door.     Time  4    Period  Weeks    Status  Partially Met               Plan - 02/06/18 1313    Clinical Impression Statement  A: Pt reports she is now able to clean her house, reach overhead, and reach behind her back to fasten her bra "with ease." Reassessment completed this session, pt has met 4/5 STGs and has made great improvements in ROM, strength, and functional use of LUE. Pt is agreeable to discharge today with HEP.     Plan  P: Discharge pt       Patient will benefit from skilled therapeutic intervention in order to improve the following deficits and impairments:  Decreased strength, Decreased range of motion, Pain, Increased fascial restrictions, Impaired UE functional use  Visit Diagnosis: Acute pain of left shoulder  Other symptoms and signs involving the musculoskeletal system    Problem List Patient Active Problem List   Diagnosis Date Noted  . Monoclonal gammopathy of unknown significance (MGUS) 11/26/2017  .  Memory loss of unknown cause 11/10/2017  . Chronic left shoulder pain 09/14/2017  . Encounter for medication management 08/10/2017  . Encounter for chronic pain management 02/09/2017  . Carpopedal spasm 01/18/2017  . Anemia in chronic renal disease 12/30/2015  . Morbid obesity ([place]) 04/18/2015  . Myasthenia gravis in remission ([place]) 11/24/2014  . Myasthenia gravis ([place]) 11/16/2014  . Vitamin D deficiency 05/24/2014  . Osteopenia 04/26/2014  . Macular hole of left eye 01/19/2014  . Generalized osteoarthritis 05/25/2013  . Esophageal dysphagia 04/03/2012  . Abnormal chest CT 03/02/2012  . Insomnia 01/02/2012  . Essential hypertension, benign 05/30/2011  . Schatzki's ring 05/07/2011  . INGROWN TOENAIL 01/10/2011  . GLAUCOMA 08/08/2010  . DYSCHROMIA, UNSPECIFIED 08/08/2010  . Chronic renal disease, stage 3, moderately decreased glomerular filtration rate  (GFR) between 30-59 mL/min/1.73 square meter (HCC) 09/14/2009  . HIP PAIN, LEFT 06/09/2009  . Urinary incontinence 06/09/2009  . Iron deficiency anemia 10/02/2008  . Hypothyroidism 09/27/2008  . Hyperlipemia 06/30/2008  . Depression with anxiety 06/30/2008  . GERD 06/30/2008  . Obstructive sleep apnea 06/30/2008  . CARPAL TUNNEL SYNDROME 05/19/2008  . SHOULDER PAIN 02/02/2008  . IMPINGEMENT SYNDROME 02/02/2008  . DEGENERATIVE JOINT DISEASE, KNEE 11/24/2007  . Diabetes mellitus, insulin dependent (IDDM), controlled ([place]) 09/09/2007   Guadelupe Sabin, OTR/L  [phone] 02/06/2018, 2:08 PM  [place], [place], [address] Phone: [phone]   Fax:  [phone]  Name: HUE STEVESON MRN: [id number] Date of Birth: 09/03/1941   OCCUPATIONAL THERAPY DISCHARGE SUMMARY  Visits from Start of Care: 13  Current functional level related to goals / functional outcomes: See above. Pt is now able to complete all ADLs without difficulty. Pt reports greater ease with dressing, sleeping, and housework tasks.    Remaining deficits: Continues to have strength deficits with LUE   Education / Equipment: A/ROM and scapular theraband Plan: Patient agrees to discharge.  Patient goals were met. Patient is being discharged due to meeting the stated rehab goals.  ?????

## 2018-02-20 ENCOUNTER — Ambulatory Visit (INDEPENDENT_AMBULATORY_CARE_PROVIDER_SITE_OTHER): Payer: Medicare Other | Admitting: Ophthalmology

## 2018-02-20 ENCOUNTER — Other Ambulatory Visit: Payer: Self-pay | Admitting: Family Medicine

## 2018-02-20 DIAGNOSIS — H35033 Hypertensive retinopathy, bilateral: Secondary | ICD-10-CM | POA: Diagnosis not present

## 2018-02-20 DIAGNOSIS — H43811 Vitreous degeneration, right eye: Secondary | ICD-10-CM | POA: Diagnosis not present

## 2018-02-20 DIAGNOSIS — H34811 Central retinal vein occlusion, right eye, with macular edema: Secondary | ICD-10-CM

## 2018-02-20 DIAGNOSIS — E11311 Type 2 diabetes mellitus with unspecified diabetic retinopathy with macular edema: Secondary | ICD-10-CM

## 2018-02-20 DIAGNOSIS — E113392 Type 2 diabetes mellitus with moderate nonproliferative diabetic retinopathy without macular edema, left eye: Secondary | ICD-10-CM

## 2018-02-20 DIAGNOSIS — H35342 Macular cyst, hole, or pseudohole, left eye: Secondary | ICD-10-CM

## 2018-02-20 DIAGNOSIS — E113311 Type 2 diabetes mellitus with moderate nonproliferative diabetic retinopathy with macular edema, right eye: Secondary | ICD-10-CM

## 2018-02-20 DIAGNOSIS — I10 Essential (primary) hypertension: Secondary | ICD-10-CM | POA: Diagnosis not present

## 2018-03-04 ENCOUNTER — Ambulatory Visit: Payer: Medicare Other | Admitting: Orthopedic Surgery

## 2018-03-04 ENCOUNTER — Encounter: Payer: Self-pay | Admitting: Orthopedic Surgery

## 2018-03-04 VITALS — BP 137/37 | HR 62 | Ht 66.0 in | Wt 241.0 lb

## 2018-03-04 DIAGNOSIS — M1712 Unilateral primary osteoarthritis, left knee: Secondary | ICD-10-CM | POA: Diagnosis not present

## 2018-03-04 DIAGNOSIS — M75122 Complete rotator cuff tear or rupture of left shoulder, not specified as traumatic: Secondary | ICD-10-CM | POA: Diagnosis not present

## 2018-03-04 DIAGNOSIS — M7552 Bursitis of left shoulder: Secondary | ICD-10-CM | POA: Diagnosis not present

## 2018-03-04 DIAGNOSIS — M25512 Pain in left shoulder: Secondary | ICD-10-CM

## 2018-03-04 DIAGNOSIS — G8929 Other chronic pain: Secondary | ICD-10-CM

## 2018-03-04 NOTE — Progress Notes (Signed)
Routine follow-up visit today  Problem #1 recheck left shoulder after physical therapy for complete rotator cuff tear chronic pain and bursitis left shoulder.  2.  Patient requests injection left knee  Left shoulder has improved with decreased overall pain and improved range of motion  Left knee pain increasing over the last few weeks.  Review of Systems  Cardiovascular: Negative for chest pain.  Skin: Negative.    Physical Exam  Constitutional: She is oriented to person, place, and time. She appears well-developed and well-nourished.  Neurological: She is alert and oriented to person, place, and time.  Psychiatric: She has a normal mood and affect. Judgment normal.  Vitals reviewed.  Left shoulder passive range of motion is improved with less pain abduction 90 degrees external rotation 30 degrees  Active range of motion 90 degrees abduction  Left knee tenderness mild swelling flexion 95 degrees  Encounter Diagnoses  Name Primary?  . Chronic left shoulder pain   . Bursitis of left shoulder   . Complete rotator cuff tear of left shoulder   . Primary osteoarthritis of left knee Yes   Procedure note left knee injection verbal consent was obtained to inject left knee joint  Timeout was completed to confirm the site of injection  The medications used were 40 mg of Depo-Medrol and 1% lidocaine 3 cc  Anesthesia was provided by ethyl chloride and the skin was prepped with alcohol.  After cleaning the skin with alcohol a 20-gauge needle was used to inject the left knee joint. There were no complications. A sterile bandage was applied.   Follow as needed

## 2018-03-17 ENCOUNTER — Other Ambulatory Visit: Payer: Self-pay | Admitting: Family Medicine

## 2018-03-18 ENCOUNTER — Other Ambulatory Visit (HOSPITAL_COMMUNITY): Payer: Self-pay | Admitting: *Deleted

## 2018-03-18 DIAGNOSIS — D631 Anemia in chronic kidney disease: Secondary | ICD-10-CM

## 2018-03-18 DIAGNOSIS — N189 Chronic kidney disease, unspecified: Secondary | ICD-10-CM

## 2018-03-18 DIAGNOSIS — D508 Other iron deficiency anemias: Secondary | ICD-10-CM

## 2018-03-19 ENCOUNTER — Other Ambulatory Visit (HOSPITAL_COMMUNITY): Payer: Medicare Other

## 2018-03-24 ENCOUNTER — Ambulatory Visit (HOSPITAL_COMMUNITY): Payer: Medicare Other

## 2018-03-25 ENCOUNTER — Ambulatory Visit: Payer: Medicare Other | Admitting: Family Medicine

## 2018-03-25 ENCOUNTER — Telehealth: Payer: Self-pay | Admitting: Family Medicine

## 2018-03-25 NOTE — Telephone Encounter (Signed)
Pt Fell in the TUB--wants to cxl appt for today  will call back

## 2018-03-26 ENCOUNTER — Ambulatory Visit (HOSPITAL_COMMUNITY): Payer: Medicare Other | Admitting: Internal Medicine

## 2018-03-31 ENCOUNTER — Encounter: Payer: Self-pay | Admitting: Family Medicine

## 2018-03-31 ENCOUNTER — Ambulatory Visit (INDEPENDENT_AMBULATORY_CARE_PROVIDER_SITE_OTHER): Payer: Medicare Other | Admitting: Family Medicine

## 2018-03-31 VITALS — BP 130/64 | HR 78 | Resp 14 | Ht 66.0 in | Wt 240.0 lb

## 2018-03-31 DIAGNOSIS — Z794 Long term (current) use of insulin: Secondary | ICD-10-CM | POA: Diagnosis not present

## 2018-03-31 DIAGNOSIS — G7000 Myasthenia gravis without (acute) exacerbation: Secondary | ICD-10-CM | POA: Diagnosis not present

## 2018-03-31 DIAGNOSIS — E119 Type 2 diabetes mellitus without complications: Secondary | ICD-10-CM | POA: Diagnosis not present

## 2018-03-31 DIAGNOSIS — E1165 Type 2 diabetes mellitus with hyperglycemia: Secondary | ICD-10-CM | POA: Diagnosis not present

## 2018-03-31 DIAGNOSIS — F418 Other specified anxiety disorders: Secondary | ICD-10-CM | POA: Diagnosis not present

## 2018-03-31 DIAGNOSIS — E1121 Type 2 diabetes mellitus with diabetic nephropathy: Secondary | ICD-10-CM | POA: Diagnosis not present

## 2018-03-31 DIAGNOSIS — E785 Hyperlipidemia, unspecified: Secondary | ICD-10-CM

## 2018-03-31 DIAGNOSIS — IMO0001 Reserved for inherently not codable concepts without codable children: Secondary | ICD-10-CM

## 2018-03-31 DIAGNOSIS — I10 Essential (primary) hypertension: Secondary | ICD-10-CM

## 2018-03-31 DIAGNOSIS — IMO0002 Reserved for concepts with insufficient information to code with codable children: Secondary | ICD-10-CM | POA: Insufficient documentation

## 2018-03-31 DIAGNOSIS — G8929 Other chronic pain: Secondary | ICD-10-CM

## 2018-03-31 MED ORDER — HYDROCODONE-ACETAMINOPHEN 10-325 MG PO TABS: ORAL_TABLET | ORAL | 0 refills | Status: AC

## 2018-03-31 NOTE — Patient Instructions (Addendum)
Wellness with nurse due in May, please schedule  Please sschedule screening mammogram past due  Fasting lipid, cmp and EGFr and HBa1C today and microalb  You are being rreferred to telepsychiatry

## 2018-04-01 LAB — LIPID PANEL
Cholesterol: 191 mg/dL (ref <200)
HDL: 50 mg/dL — AB (ref >50)
LDL CHOLESTEROL (CALC): 124 mg/dL — AB
NON-HDL CHOLESTEROL (CALC): 141 mg/dL — AB (ref <130)
TRIGLYCERIDES: 71 mg/dL (ref <150)
Total CHOL/HDL Ratio: 3.8 (calc) (ref <5.0)

## 2018-04-01 LAB — MICROALBUMIN / CREATININE URINE RATIO
CREATININE, URINE: 145 mg/dL (ref 20–275)
Microalb Creat Ratio: 118 mcg/mg creat — ABNORMAL HIGH (ref <30)
Microalb, Ur: 17.1 mg/dL

## 2018-04-01 LAB — HEMOGLOBIN A1C
HEMOGLOBIN A1C: 6.3 %{Hb} — AB (ref <5.7)
Mean Plasma Glucose: 134 (calc)
eAG (mmol/L): 7.4 (calc)

## 2018-04-01 LAB — COMPLETE METABOLIC PANEL WITH GFR
AG RATIO: 1.4 (calc) (ref 1.0–2.5)
ALKALINE PHOSPHATASE (APISO): 169 U/L — AB (ref 33–130)
ALT: 13 U/L (ref 6–29)
AST: 15 U/L (ref 10–35)
Albumin: 4 g/dL (ref 3.6–5.1)
BILIRUBIN TOTAL: 0.4 mg/dL (ref 0.2–1.2)
BUN / CREAT RATIO: 26 (calc) — AB (ref 6–22)
BUN: 25 mg/dL (ref 7–25)
CHLORIDE: 108 mmol/L (ref 98–110)
CO2: 25 mmol/L (ref 20–32)
Calcium: 9.4 mg/dL (ref 8.6–10.4)
Creat: 0.97 mg/dL — ABNORMAL HIGH (ref 0.60–0.93)
GFR, Est African American: 66 mL/min/{1.73_m2} (ref >=60)
GFR, Est Non African American: 57 mL/min/{1.73_m2} — ABNORMAL LOW (ref >=60)
GLUCOSE: 95 mg/dL (ref 65–99)
Globulin: 2.9 g/dL (calc) (ref 1.9–3.7)
POTASSIUM: 4.5 mmol/L (ref 3.5–5.3)
Sodium: 140 mmol/L (ref 135–146)
Total Protein: 6.9 g/dL (ref 6.1–8.1)

## 2018-04-04 NOTE — Assessment & Plan Note (Signed)
The patient's Controlled Substance registry is reviewed and compliance confirmed. Adequacy of  Pain control and level of function is assessed. Medication dosing is adjusted as deemed appropriate. Twelve weeks of medication is prescribed , patient signs for the script and is provided with a follow up appointment between 11 to 12 weeks .  

## 2018-04-04 NOTE — Assessment & Plan Note (Signed)
Controlled, no change in medication DASH diet and commitment to daily physical activity for a minimum of 30 minutes discussed and encouraged, as a part of hypertension management. The importance of attaining a healthy weight is also discussed.  BP/Weight 03/31/2018 03/04/2018 01/07/2018 11/26/2017 11/06/2017 9/78/4784 01/01/8207  Systolic BP 138 871 959 747 185 501 586  Diastolic BP 64 37 80 50 82 80 60  Wt. (Lbs) 240 241 241 242.8 257 249.75 241.25  BMI 38.74 38.9 38.9 39.19 41.48 40.31 38.94

## 2018-04-04 NOTE — Assessment & Plan Note (Signed)
Unchanged Patient re-educated about  the importance of commitment to a  minimum of 150 minutes of exercise per week.  The importance of healthy food choices with portion control discussed. Encouraged to start a food diary, count calories and to consider  joining a support group. Sample diet sheets offered. Goals set by the patient for the next several months.   Weight /BMI 03/31/2018 03/04/2018 01/07/2018  WEIGHT 240 lb 241 lb 241 lb  HEIGHT 5\' 6"  5\' 6"  5\' 6"   BMI 38.74 kg/m2 38.9 kg/m2 38.9 kg/m2

## 2018-04-04 NOTE — Assessment & Plan Note (Signed)
Controlled, no change in medication [name] is reminded of the importance of commitment to daily physical activity for 30 minutes or more, as able and the need to limit carbohydrate intake to 30 to 60 grams per meal to help with blood sugar control.   The need to take medication as prescribed, test blood sugar as directed, and to call between visits if there is a concern that blood sugar is uncontrolled is also discussed.   [name] is reminded of the importance of daily foot exam, annual eye examination, and good blood sugar, blood pressure and cholesterol control.  Diabetic Labs Latest Ref Rng & Units 03/31/2018 11/26/2017 09/13/2017 05/07/2017 05/06/2017  HbA1c <5.7 % of total Hgb 6.3(H) - 6.5(H) - 6.8(H)  Microalbumin mg/dL 17.1 - - - -  Micro/Creat Ratio <30 mcg/mg creat 118(H) - - - -  Chol <200 mg/dL 191 - 160 - 223(H)  HDL >50 mg/dL 50(L) - 47(L) - 44(L)  Calc LDL mg/dL (calc) 124(H) - 92 - 150(H)  Triglycerides <150 mg/dL 71 - 117 - 143  Creatinine 0.60 - 0.93 mg/dL 0.97(H) 0.99 0.87 1.12(H) 1.01(H)   BP/Weight 03/31/2018 03/04/2018 01/07/2018 11/26/2017 11/06/2017 9/79/4801 06/04/5373  Systolic BP 827 078 675 449 201 007 121  Diastolic BP 64 37 80 50 82 80 60  Wt. 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

## 2018-04-04 NOTE — Progress Notes (Signed)
[name]     MRN: [id number]      DOB: 01/22/1941   HPI [name] is here for follow up and re-evaluation of chronic medical conditions, medication management and review of any available recent lab and radiology data.  Preventive health is updated, specifically  Cancer screening and Immunization.   There are no new concerns.  C/o depression and feeling sad often,  But has now started going out more   ROS Denies recent fever or chills. Denies sinus pressure, nasal congestion, ear pain or sore throat. Denies chest congestion, productive cough or wheezing. Denies chest pains, palpitations and leg swelling Denies abdominal pain, nausea, vomiting,diarrhea or constipation.   C/O joint pain, swelling and limitation in mobility. Denies headaches, seizures, numbness, or tingling. C/O depression, anxiety or insomnia. Denies skin break down or rash.   PE  BP 130/64   Pulse 78   Resp 14   Ht 5\' 6"  (1.676 m)   Wt 240 lb (108.9 kg)   SpO2 98%   BMI 38.74 kg/m   Patient alert and oriented and in no cardiopulmonary distress.  HEENT: No facial asymmetry, EOMI,   oropharynx pink and moist.  Neck supple no JVD, no mass.  Chest: Clear to auscultation bilaterally.  CVS: S1, S2 no murmurs, no S3.Regular rate.  ABD: Soft non tender.   Ext: No edema  MS: DECREASED rom SPINE, HIPS, SHOULDERS AND KNEES  Skin: Intact, no ulcerations or rash noted.  Psych: Good eye contact, normal affect. Memory intact not anxious at times tearful and  depressed appearing.  CNS: CN 2-12 intact, power,  normal throughout.no focal deficits noted.   Assessment & Plan  Encounter for chronic pain management The patient's Controlled Substance registry is reviewed and compliance confirmed. Adequacy of  Pain control and level of function is assessed. Medication dosing is adjusted as deemed appropriate. Twelve weeks of medication is prescribed , patient signs for the script and is provided with a  follow up appointment between 11 to 12 weeks .   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 Her primary reason for depression is a feeling of loneliness and neglect by her adult children and grandchildren except for 1 grandson,the others seem to have no time for her  Diabetes mellitus, insulin dependent (IDDM), controlled ([place]) Controlled, no change in medication Ms. Berish is reminded of the importance of commitment to daily physical activity for 30 minutes or more, as able and the need to limit carbohydrate intake to 30 to 60 grams per meal to help with blood sugar control.   The need to take medication as prescribed, test blood sugar as directed, and to call between visits if there is a concern that blood sugar is uncontrolled is also discussed.   Ms. Mifsud is reminded of the importance of daily foot exam, annual eye examination, and good blood sugar, blood pressure and cholesterol control.  Diabetic Labs Latest Ref Rng & Units 03/31/2018 11/26/2017 09/13/2017 05/07/2017 05/06/2017  HbA1c <5.7 % of total Hgb 6.3(H) - 6.5(H) - 6.8(H)  Microalbumin mg/dL 17.1 - - - -  Micro/Creat Ratio <30 mcg/mg creat 118(H) - - - -  Chol <200 mg/dL 191 - 160 - 223(H)  HDL >50 mg/dL 50(L) - 47(L) - 44(L)  Calc LDL  mg/dL (calc) 124(H) - 92 - 150(H)  Triglycerides <150 mg/dL 71 - 117 - 143  Creatinine 0.60 - 0.93 mg/dL 0.97(H) 0.99 0.87 1.12(H) 1.01(H)   BP/Weight 03/31/2018 03/04/2018 01/07/2018 11/26/2017 11/06/2017 0/27/7412 08/07/8675  Systolic BP 720 947 096 283 662 947 654  Diastolic BP 64 37 80 50 82 80 60  Wt. (Lbs) 240 241 241 242.8 257 249.75 241.25  BMI 38.74 38.9 38.9 39.19 41.48 40.31 38.94   Foot/eye exam completion dates Latest Ref Rng & Units 09/11/2017 02/18/2017  Eye Exam No Retinopathy -  Retinopathy(A)  Foot Form Completion - Done -        Essential hypertension, benign Controlled, no change in medication DASH diet and commitment to daily physical activity for a minimum of 30 minutes discussed and encouraged, as a part of hypertension management. The importance of attaining a healthy weight is also discussed.  BP/Weight 03/31/2018 03/04/2018 01/07/2018 11/26/2017 11/06/2017 6/50/3546 05/05/8126  Systolic BP 517 001 749 449 675 916 384  Diastolic BP 64 37 80 50 82 80 60  Wt. (Lbs) 240 241 241 242.8 257 249.75 241.25  BMI 38.74 38.9 38.9 39.19 41.48 40.31 38.94       Morbid obesity Unchanged Patient re-educated about  the importance of commitment to a  minimum of 150 minutes of exercise per week.  The importance of healthy food choices with portion control discussed. Encouraged to start a food diary, count calories and to consider  joining a support group. Sample diet sheets offered. Goals set by the patient for the next several months.   Weight /BMI 03/31/2018 03/04/2018 01/07/2018  WEIGHT 240 lb 241 lb 241 lb  HEIGHT 5\' 6"  5\' 6"  5\' 6"   BMI 38.74 kg/m2 38.9 kg/m2 38.9 kg/m2

## 2018-04-04 NOTE — Assessment & Plan Note (Signed)
Pt on antidepressant medication, she has improved , but is interested in and will do well with psychotherapy , which she has not been able to afford, she is a great candidate for referral for home telepsych and she is interestedy. Her primary reason for depression is a feeling of loneliness and neglect by her adult children and grandchildren except for 1 grandson,the others seem to have no time for her

## 2018-04-07 ENCOUNTER — Telehealth: Payer: Self-pay

## 2018-04-07 NOTE — Telephone Encounter (Signed)
Patient reports that she does not like to talk on her cell phone.  At present her house phone is not working and she is working with [employer] to fix the problem.  Patient took my number and told me that she would call me when her home phone services was repaired.   Writer routed this informationto the PCP and Dr Modesta Messing.

## 2018-04-08 ENCOUNTER — Other Ambulatory Visit: Payer: Self-pay | Admitting: Family Medicine

## 2018-04-10 DIAGNOSIS — H401132 Primary open-angle glaucoma, bilateral, moderate stage: Secondary | ICD-10-CM | POA: Diagnosis not present

## 2018-04-11 ENCOUNTER — Other Ambulatory Visit: Payer: Self-pay | Admitting: Family Medicine

## 2018-04-14 LAB — HM DIABETES EYE EXAM

## 2018-04-18 ENCOUNTER — Other Ambulatory Visit: Payer: Self-pay | Admitting: Family Medicine

## 2018-04-18 ENCOUNTER — Other Ambulatory Visit: Payer: Self-pay | Admitting: Gastroenterology

## 2018-04-22 ENCOUNTER — Telehealth: Payer: Self-pay

## 2018-04-22 NOTE — Telephone Encounter (Signed)
[facility] - left message.  

## 2018-04-30 ENCOUNTER — Telehealth: Payer: Self-pay | Admitting: Clinical

## 2018-04-30 NOTE — Telephone Encounter (Signed)
This [facility] specialist received a call from [name] that Jenna phone is working and will be able to talk to someone. However, she was not able to talk long today so this [facility] specialist scheduled a phone call appointment for tomorrow to complete the initial assessment on 05/01/18 between 11am-11:30am.  [name] reported things are better even though she still feels hurt when [name] are not available to take Jenna to Jenna medical appointments.  Plan: Complete initial intake assessment on 05/01/18.

## 2018-05-01 ENCOUNTER — Telehealth: Payer: Self-pay | Admitting: Clinical

## 2018-05-01 NOTE — Telephone Encounter (Signed)
This [facility] specialist left message to call back with name and contact information. 

## 2018-05-01 NOTE — Telephone Encounter (Signed)
[facility] specialist unable to complete intake since home phone is not working again.  Will attempt to complete intake on 05/02/18.

## 2018-05-01 NOTE — Telephone Encounter (Signed)
Spoke with [name] and she reported her home phone has not been working well today and she did not want to talk long on her cell phone since she doesn't know how many minutes she has.  This [facility] will plan on calling her again tomorrow morning on her home number.

## 2018-05-02 ENCOUNTER — Telehealth: Payer: Self-pay | Admitting: Clinical

## 2018-05-02 NOTE — Telephone Encounter (Signed)
This [facility] specialist tried to call [name] twice on home phone and it was not working. [name] was reached via cell phone but she did not want to talk long on her cell phone. She was open to having [facility] specialist call her back next week on her home phone.  [name] reported she's doing fine overall except for her sinus.  [name] denied any needs at this time.  Plan: This [facility] specialist will try to contact [name] next week when her home phone is working.

## 2018-05-05 ENCOUNTER — Telehealth: Payer: Self-pay | Admitting: Family Medicine

## 2018-05-05 ENCOUNTER — Other Ambulatory Visit: Payer: Self-pay | Admitting: Family Medicine

## 2018-05-05 DIAGNOSIS — M65331 Trigger finger, right middle finger: Secondary | ICD-10-CM

## 2018-05-05 DIAGNOSIS — M65341 Trigger finger, right ring finger: Secondary | ICD-10-CM

## 2018-05-05 NOTE — Telephone Encounter (Signed)
Pt is calling in, she does not want to be seen, she wants an Referral to the Orthopaedic for Trigger finger.

## 2018-05-05 NOTE — Progress Notes (Signed)
amb ortho  

## 2018-05-05 NOTE — Progress Notes (Signed)
[facility]  

## 2018-05-05 NOTE — Telephone Encounter (Signed)
Ok to enter referral 

## 2018-05-05 NOTE — Telephone Encounter (Signed)
Right ring and middle trigger x 2 months getting worse wants Dr Aline Brochure will refer

## 2018-05-09 ENCOUNTER — Ambulatory Visit (INDEPENDENT_AMBULATORY_CARE_PROVIDER_SITE_OTHER): Payer: Medicare Other

## 2018-05-09 VITALS — BP 142/78 | HR 88 | Resp 16 | Ht 66.0 in | Wt 243.0 lb

## 2018-05-09 DIAGNOSIS — Z1231 Encounter for screening mammogram for malignant neoplasm of breast: Secondary | ICD-10-CM | POA: Diagnosis not present

## 2018-05-09 DIAGNOSIS — Z0000 Encounter for general adult medical examination without abnormal findings: Secondary | ICD-10-CM | POA: Diagnosis not present

## 2018-05-09 NOTE — Patient Instructions (Addendum)
[name] , Thank you for taking time to come for your Medicare Wellness Visit. I appreciate your ongoing commitment to your health goals. Please review the following plan we discussed and let me know if I can assist you in the future.   Screening recommendations/referrals: Colonoscopy: due 2023 Mammogram: ordered and scheduled at visit  Bone Density:  Recommended yearly ophthalmology/optometry visit for glaucoma screening and checkup Recommended yearly dental visit for hygiene and checkup  Vaccinations: Influenza vaccine: Done Pneumococcal vaccine: Done  Tdap vaccine: Due  Shingles vaccine: Due   Advanced directives: info given   Conditions/risks identified: yes  Next appointment: scheduled   Preventive Care 66 Years and Older, Female Preventive care refers to lifestyle choices and visits with your health care provider that can promote health and wellness. What does preventive care include?  A yearly physical exam. This is also called an annual well check.  Dental exams once or twice a year.  Routine eye exams. Ask your health care provider how often you should have your eyes checked.  Personal lifestyle choices, including:  Daily care of your teeth and gums.  Regular physical activity.  Eating a healthy diet.  Avoiding tobacco and drug use.  Limiting alcohol use.  Practicing safe sex.  Taking low-dose aspirin every day.  Taking vitamin and mineral supplements as recommended by your health care provider. What happens during an annual well check? The services and screenings done by your health care provider during your annual well check will depend on your age, overall health, lifestyle risk factors, and family history of disease. Counseling  Your health care provider may ask you questions about your:  Alcohol use.  Tobacco use.  Drug use.  Emotional well-being.  Home and relationship well-being.  Sexual activity.  Eating habits.  History of  falls.  Memory and ability to understand (cognition).  Work and work Statistician.  Reproductive health. Screening  You may have the following tests or measurements:  Height, weight, and BMI.  Blood pressure.  Lipid and cholesterol levels. These may be checked every 5 years, or more frequently if you are over 78 years old.  Skin check.  Lung cancer screening. You may have this screening every year starting at age 55 if you have a 30-pack-year history of smoking and currently smoke or have quit within the past 15 years.  Fecal occult blood test (FOBT) of the stool. You may have this test every year starting at age 17.  Flexible sigmoidoscopy or colonoscopy. You may have a sigmoidoscopy every 5 years or a colonoscopy every 10 years starting at age 31.  Hepatitis C blood test.  Hepatitis B blood test.  Sexually transmitted disease (STD) testing.  Diabetes screening. This is done by checking your blood sugar (glucose) after you have not eaten for a while (fasting). You may have this done every 1-3 years.  Bone density scan. This is done to screen for osteoporosis. You may have this done starting at age 55.  Mammogram. This may be done every 1-2 years. Talk to your health care provider about how often you should have regular mammograms. Talk with your health care provider about your test results, treatment options, and if necessary, the need for more tests. Vaccines  Your health care provider may recommend certain vaccines, such as:  Influenza vaccine. This is recommended every year.  Tetanus, diphtheria, and acellular pertussis (Tdap, Td) vaccine. You may need a Td booster every 10 years.  Zoster vaccine. You may need this after  age 71.  Pneumococcal 13-valent conjugate (PCV13) vaccine. One dose is recommended after age 27.  Pneumococcal polysaccharide (PPSV23) vaccine. One dose is recommended after age 3. Talk to your health care provider about which screenings and  vaccines you need and how often you need them. This information is not intended to replace advice given to you by your health care provider. Make sure you discuss any questions you have with your health care provider. Document Released: 01/13/2016 Document Revised: 09/05/2016 Document Reviewed: 10/18/2015 Elsevier Interactive Patient Education  2017 [place] Prevention in the Home Falls can cause injuries. They can happen to people of all ages. There are many things you can do to make your home safe and to help prevent falls. What can I do on the outside of my home?  Regularly fix the edges of walkways and driveways and fix any cracks.  Remove anything that might make you trip as you walk through a door, such as a raised step or threshold.  Trim any bushes or trees on the path to your home.  Use bright outdoor lighting.  Clear any walking paths of anything that might make someone trip, such as rocks or tools.  Regularly check to see if handrails are loose or broken. Make sure that both sides of any steps have handrails.  Any raised decks and porches should have guardrails on the edges.  Have any leaves, snow, or ice cleared regularly.  Use sand or salt on walking paths during winter.  Clean up any spills in your garage right away. This includes oil or grease spills. What can I do in the bathroom?  Use night lights.  Install grab bars by the toilet and in the tub and shower. Do not use towel bars as grab bars.  Use non-skid mats or decals in the tub or shower.  If you need to sit down in the shower, use a plastic, non-slip stool.  Keep the floor dry. Clean up any water that spills on the floor as soon as it happens.  Remove soap buildup in the tub or shower regularly.  Attach bath mats securely with double-sided non-slip rug tape.  Do not have throw rugs and other things on the floor that can make you trip. What can I do in the bedroom?  Use night  lights.  Make sure that you have a light by your bed that is easy to reach.  Do not use any sheets or blankets that are too big for your bed. They should not hang down onto the floor.  Have a firm chair that has side arms. You can use this for support while you get dressed.  Do not have throw rugs and other things on the floor that can make you trip. What can I do in the kitchen?  Clean up any spills right away.  Avoid walking on wet floors.  Keep items that you use a lot in easy-to-reach places.  If you need to reach something above you, use a strong step stool that has a grab bar.  Keep electrical cords out of the way.  Do not use floor polish or wax that makes floors slippery. If you must use wax, use non-skid floor wax.  Do not have throw rugs and other things on the floor that can make you trip. What can I do with my stairs?  Do not leave any items on the stairs.  Make sure that there are handrails on both sides of the stairs  and use them. Fix handrails that are broken or loose. Make sure that handrails are as long as the stairways.  Check any carpeting to make sure that it is firmly attached to the stairs. Fix any carpet that is loose or worn.  Avoid having throw rugs at the top or bottom of the stairs. If you do have throw rugs, attach them to the floor with carpet tape.  Make sure that you have a light switch at the top of the stairs and the bottom of the stairs. If you do not have them, ask someone to add them for you. What else can I do to help prevent falls?  Wear shoes that:  Do not have high heels.  Have rubber bottoms.  Are comfortable and fit you well.  Are closed at the toe. Do not wear sandals.  If you use a stepladder:  Make sure that it is fully opened. Do not climb a closed stepladder.  Make sure that both sides of the stepladder are locked into place.  Ask someone to hold it for you, if possible.  Clearly mark and make sure that you can  see:  Any grab bars or handrails.  First and last steps.  Where the edge of each step is.  Use tools that help you move around (mobility aids) if they are needed. These include:  Canes.  Walkers.  Scooters.  Crutches.  Turn on the lights when you go into a dark area. Replace any light bulbs as soon as they burn out.  Set up your furniture so you have a clear path. Avoid moving your furniture around.  If any of your floors are uneven, fix them.  If there are any pets around you, be aware of where they are.  Review your medicines with your doctor. Some medicines can make you feel dizzy. This can increase your chance of falling. Ask your doctor what other things that you can do to help prevent falls. This information is not intended to replace advice given to you by your health care provider. Make sure you discuss any questions you have with your health care provider. Document Released: 10/13/2009 Document Revised: 05/24/2016 Document Reviewed: 01/21/2015 Elsevier Interactive Patient Education  2017 [employer].

## 2018-05-09 NOTE — Progress Notes (Signed)
Subjective:   [name] is a 77 y.o. female who presents for Medicare Annual (Subsequent) preventive examination.  Review of Systems:   Cardiac Risk Factors include: advanced age (>64men, >38 women);diabetes mellitus;hypertension;sedentary lifestyle     Objective:     Vitals: BP (!) 142/78   Pulse 88   Resp 16   Wt 243 lb (110.2 kg)   SpO2 98%   BMI 39.22 kg/m   Body mass index is 39.22 kg/m.  Advanced Directives 12/04/2017 11/26/2017 05/07/2017 05/06/2017 03/25/2017 03/16/2016 02/14/2016  Does Patient Have a Medical Advance Directive? No No - No No No No  Type of Advance Directive - - - - - - -  Would patient like information on creating a medical advance directive? No - Patient declined No - Patient declined No - Patient declined Yes (MAU/Ambulatory/Procedural Areas - Information given) No - Patient declined No - patient declined information No - patient declined information  Pre-existing out of facility DNR order (yellow form or pink MOST form) - - - - - - -    Tobacco Social History   Tobacco Use  Smoking Status Former Smoker  . Packs/day: 0.50  . Years: 25.00  . Pack years: 12.50  . Types: Cigarettes  . Last attempt to quit: 01/01/2004  . Years since quitting: 14.3  Smokeless Tobacco Never Used     Counseling given: Not Answered   Clinical Intake:     Pain : 0-10 Pain Score: 6  Pain Type: Chronic pain Pain Location: Other (Comment)     Nutritional Status: BMI > 30  Obese Diabetes: Yes CBG done?: No Did pt. bring in CBG monitor from home?: No  How often do you need to have someone help you when you read instructions, pamphlets, or other written materials from your doctor or pharmacy?: 2 - Rarely  Interpreter Needed?: No     Past Medical History:  Diagnosis Date  . Anemia in chronic renal disease 12/30/2015  . Anxiety   . Arthritis    "all over" (01/19/2014)  . Carpal tunnel syndrome   . Chronic bronchitis ([place])    "got it q yr for awhile;  hasn't had it in awhile" (01/19/2014)  . Chronic kidney disease (CKD) stage G3b/A1, moderately decreased glomerular filtration rate (GFR) between 30-44 mL/min/1.73 square meter and albuminuria creatinine ratio less than 30 mg/g (HCC) 09/14/2009   Qualifier: Diagnosis of  By: Moshe Cipro MD, Joycelyn Schmid    . Chronic renal disease, stage 3, moderately decreased glomerular filtration rate (GFR) between 30-59 mL/min/1.73 square meter (HCC) 09/14/2009   Qualifier: Diagnosis of  By: Moshe Cipro MD, Joycelyn Schmid    . Depression   . Diverticulosis 07/2004   Colonscopy Dr Gala Romney  . Esophagitis, erosive 2009  . Exertional shortness of breath   . Gastroesophageal reflux   . QQIWLNLG(921.1)    "usually a couple times/wk" (01/19/2014)  . Heart murmur    saw cardiology In [place], he told her she did not need to come back.  . Hyperlipidemia   . Hypertension   . Impingement syndrome, shoulder   . Iron deficiency anemia   . Mitral regurgitation   . Myasthenia gravis   . Myasthenia gravis in remission ([place]) 11/24/2014  . Obesity   . OSA on CPAP   . Pneumonia 01/2012  . Pulmonary HTN ([place])   . Schatzki's ring    Last EGD w/ dilation 02/08/11, 2009 & 2007  . Seasonal allergies   . Shoulder pain   . Thyroid disease    "  used to take RX; they took me off it" (01/19/2014)  . Tobacco abuse   . Type II diabetes mellitus ([place])    Past Surgical History:  Procedure Laterality Date  . [place] VITRECTOMY WITH 20 GAUGE MVR PORT Left 01/19/2014   Procedure: 25 GAUGE PARS PLANA VITRECTOMY WITH 20 GAUGE MVR PORT; MEMBRAME PEEL; SERUM PATCH; LASER TREATMENT; C3F8;  Surgeon: Hayden Pedro, MD;  Location: [facility];  Service: Ophthalmology;  Laterality: Left;  . ABDOMINAL HYSTERECTOMY    . APPENDECTOMY    . CARPAL TUNNEL RELEASE Bilateral   . CATARACT EXTRACTION W/PHACO Left 10/21/2013   Procedure: LEFT CATARACT EXTRACTION PHACO AND INTRAOCULAR LENS PLACEMENT (IOC);  Surgeon: Marylynn Pearson, MD;  Location: [facility];  Service:  Ophthalmology;  Laterality: Left;  . CHOLECYSTECTOMY    . COLONOSCOPY  05/08/2012   [id number] and external hemorrhoids; colonic diverticulosis  . ESOPHAGEAL DILATION     "more than 3 times" (01/19/2014)  . ESOPHAGOGASTRODUODENOSCOPY  02/08/11   Rourk-Distal esophageal erosion consistent with mild erosive reflux   esophagitis/ Noncritical Schatzki ring, small hiatal hernia otherwise upper/ gastrointestinal tract appeared unremarkable, status post passage  of a Maloney dilation to biopsy disruption of the ring described  . ESOPHAGOGASTRODUODENOSCOPY  04/2012   2 tandem incomplete distal esophagea rings s/p dilation.   . ESOPHAGOGASTRODUODENOSCOPY N/A 09/16/2014   Procedure: ESOPHAGOGASTRODUODENOSCOPY (EGD);  Surgeon: Daneil Dolin, MD;  [facility];  Service: Endoscopy;  Laterality: N/A;  1200  . EYE SURGERY    . INCONTINENCE SURGERY  08/26/09   [place]  . JOINT REPLACEMENT     right total knee  . MALONEY DILATION N/A 09/16/2014   Procedure: Venia Minks DILATION;  Surgeon: Daneil Dolin, MD;  [facility];  Service: Endoscopy;  Laterality: N/A;  . PARS PLANA VITRECTOMY W/ REPAIR OF MACULAR HOLE Left 01/19/2014  . SAVORY DILATION N/A 09/16/2014   Procedure: SAVORY DILATION;  Surgeon: Daneil Dolin, MD;  [facility];  Service: Endoscopy;  Laterality: N/A;  . TONSILLECTOMY    . TOTAL KNEE ARTHROPLASTY Right 05/13/07   Dr. Aline Brochure   Family History  Problem Relation Age of Onset  . Cancer Mother   . Heart disease Mother   . Cancer Father   . Heart disease Father   . Diabetes Sister   . Hypertension Brother   . Heart disease Brother   . Cancer Sister   . Kidney failure Sister   . Diabetes Son   . Hypertension Son   . Hypertension Daughter   . Colon cancer Neg Hx    Social History   Socioeconomic History  . Marital status: Single    Spouse name: Not on file  . Number of children: 2  . Years of education: Not on file  . Highest education level: Not  on file  Occupational History  . Occupation: disabled    Employer: RETIRED  Social Needs  . Financial resource strain: Not very hard  . Food insecurity:    Worry: Never true    Inability: Never true  . Transportation needs:    Medical: Yes    Non-medical: No  Tobacco Use  . Smoking status: Former Smoker    Packs/day: 0.50    Years: 25.00    Pack years: 12.50    Types: Cigarettes    Last attempt to quit: 01/01/2004    Years since quitting: 14.3  . Smokeless tobacco: Never Used  Substance and Sexual Activity  .  Alcohol use: No  . Drug use: No  . Sexual activity: Never    Birth control/protection: Surgical  Lifestyle  . Physical activity:    Days per week: 0 days    Minutes per session: 0 min  . Stress: Only a little  Relationships  . Social connections:    Talks on phone: Not on file    Gets together: Not on file    Attends religious service: Not on file    Active member of club or organization: Not on file    Attends meetings of clubs or organizations: Not on file    Relationship status: Not on file  Other Topics Concern  . Not on file  Social History Narrative   Patient lives at home by herself   Patient drinks one cup of coffee a day   Patient is right handed.    Outpatient Encounter Medications as of 05/09/2018  Medication Sig  . ACETAMINOPHEN EXTRA STRENGTH 500 MG tablet TAKE (1) TABLET BY MOUTH AT BEDTIME.  Marland Kitchen amLODipine (NORVASC) 10 MG tablet TAKE 1 TABLET BY MOUTH ONCE A DAY.  Marland Kitchen B-D ULTRAFINE III SHORT PEN 31G X 8 MM MISC USE ONCE DAILY WITH LANTUS SOLOSTAR PEN.  . brimonidine-timolol (COMBIGAN) 0.2-0.5 % ophthalmic solution Place 1 drop into both eyes every 12 (twelve) hours.  . cetirizine (ZYRTEC ALLERGY) 10 MG tablet Take 1 tablet (10 mg total) by mouth daily.  . citalopram (CELEXA) 20 MG tablet TAKE 1 TABLET BY MOUTH ONCE A DAY.  Marland Kitchen Cranberry 1000 MG CAPS Take 1,000 mg by mouth daily.   . cycloSPORINE (RESTASIS) 0.05 % ophthalmic emulsion Place 1 drop into  both eyes 2 (two) times daily.   Marland Kitchen diltiazem (CARDIZEM CD) 240 MG 24 hr capsule TAKE (1) CAPSULE BY MOUTH TWICE DAILY.  Marland Kitchen Flaxseed, Linseed, (FLAXSEED OIL) 1000 MG CAPS Take 1,000 mg by mouth daily.  . fluticasone (FLONASE) 50 MCG/ACT nasal spray Place 2 sprays into both nostrils daily.  Marland Kitchen gabapentin (NEURONTIN) 300 MG capsule TAKE (1) CAPSULE BY MOUTH AT BEDTIME.  . hydrALAZINE (APRESOLINE) 25 MG tablet TAKE 1 TABLET BY MOUTH THREE TIMES A DAY.  . hydrALAZINE (APRESOLINE) 25 MG tablet TAKE 1 TABLET BY MOUTH THREE TIMES A DAY.  . hydrALAZINE (APRESOLINE) 25 MG tablet TAKE 1 TABLET BY MOUTH THREE TIMES A DAY.  Marland Kitchen HYDROcodone-acetaminophen (NORCO) 10-325 MG tablet One tablet twice daily  . HYDROcodone-acetaminophen (NORCO) 10-325 MG tablet One tablet two times daily  . [START ON 06/02/2018] HYDROcodone-acetaminophen (NORCO) 10-325 MG tablet One tablet two times daily  . IRON PO Take 1 tablet by mouth 2 (two) times daily.  Marland Kitchen LANTUS SOLOSTAR 100 UNIT/ML Solostar Pen INJECT 30 UNITS SUBCUTANEOUSLY ONCE DAILY.  Marland Kitchen losartan (COZAAR) 100 MG tablet TAKE (1) TABLET BY MOUTH DAILY.  . Multiple Vitamin (MULTIVITAMIN WITH MINERALS) TABS tablet Take 1 tablet by mouth at bedtime.  . ondansetron (ZOFRAN) 4 MG tablet TAKE (1) TABLET BY MOUTH EVERY FOUR HOURS AS NEEDED FOR NAUSEA.  Glory Rosebush VERIO test strip USE AS DIRECTED TO TEST BLOOD GLUCOSE 3 TIMES DAILY.  . pantoprazole (PROTONIX) 40 MG tablet TAKE 1 TABLET BY MOUTH DAILY BEFORE BREAKFAST.  Marland Kitchen polyethylene glycol powder (GLYCOLAX/MIRALAX) powder MIX 17G (1 CAPFUL) IN 8 OUNCES OF JUICE OR WATER AND DRINK ONCE DAILY.  Marland Kitchen polyethylene glycol powder (GLYCOLAX/MIRALAX) powder MIX 17G (1 CAPFUL) IN 8 OUNCES OF JUICE OR WATER AND DRINK ONCE DAILY.  Marland Kitchen prednisoLONE acetate (PRED FORTE) 1 % ophthalmic suspension Place  1 drop into the left eye 4 (four) times daily.  . predniSONE (DELTASONE) 5 MG tablet TAKE 1 TABLET BY MOUTH DAILY.  Marland Kitchen predniSONE (DELTASONE) 5 MG tablet  TAKE 1 TABLET BY MOUTH ONCE DAILY.  Marland Kitchen PROAIR HFA 108 (90 Base) MCG/ACT inhaler INHALE 2 PUFFS BY MOUTH EVERY 6 HOURS AS NEEDED .  . rosuvastatin (CRESTOR) 40 MG tablet TAKE (1) TABLET BY MOUTH AT BEDTIME.  Marland Kitchen spironolactone (ALDACTONE) 25 MG tablet TAKE 1 TABLET BY MOUTH ONCE A DAY.  . TRADJENTA 5 MG TABS tablet TAKE 1 TABLET BY MOUTH ONCE A DAY.  Marland Kitchen venlafaxine XR (EFFEXOR-XR) 150 MG 24 hr capsule TAKE 1 CAPSULE BY MOUTH DAILY.  . VESICARE 10 MG tablet TAKE 1 TABLET BY MOUTH ONCE A DAY.  Marland Kitchen Vitamin D, Ergocalciferol, (DRISDOL) 50000 units CAPS capsule TAKE 1 CAPSULE BY MOUTH ONCE A WEEK.  . [DISCONTINUED] pyridostigmine (MESTINON) 60 MG tablet TAKE (1) TABLET BY MOUTH THREE TIMES A DAY.   No facility-administered encounter medications on file as of 05/09/2018.     Activities of Daily Living In your present state of health, do you have any difficulty performing the following activities: 05/09/2018  Hearing? N  Vision? N  Difficulty concentrating or making decisions? N  Walking or climbing stairs? Y  Dressing or bathing? N  Doing errands, shopping? N  Preparing Food and eating ? N  Using the Toilet? N  In the past six months, have you accidently leaked urine? N  Do you have problems with loss of bowel control? N  Managing your Medications? N  Managing your Finances? N  Housekeeping or managing your Housekeeping? N  Some recent data might be hidden    Patient Care Team: Fayrene Helper, MD as PCP - General Rourk, Cristopher Estimable, MD (Gastroenterology) Marylynn Pearson, MD as Consulting Physician (Ophthalmology) Phillips Odor, MD as Consulting Physician (Neurology) Hayden Pedro, MD as Consulting Physician (Ophthalmology) Whitney Muse, Kelby Fam, MD (Inactive) as Consulting Physician (Hematology and Oncology) Carole Civil, MD as Consulting Physician (Orthopedic Surgery)    Assessment:   This is a routine wellness examination for Anija.  Exercise Activities and Dietary  recommendations Current Exercise Habits: Home exercise routine, Type of exercise: walking, Time (Minutes): 10, Frequency (Times/Week): 2, Weekly Exercise (Minutes/Week): 20, Intensity: Mild, Exercise limited by: orthopedic condition(s)  Goals    . Exercise 3x per week (30 min per time)     Recommend starting a routine exercise program at least 3 days a week for 30-45 minutes at a time as tolerated.         Fall Risk Fall Risk  05/09/2018 03/31/2018 01/07/2018 08/06/2017 05/06/2017  Falls in the past year? Yes Yes No Yes Yes  Number falls in past yr: 2 or more 2 or more - - 1  Injury with Fall? No - - - No  Risk Factor Category  - - - - -  Risk for fall due to : - - - History of fall(s);Impaired balance/gait;Impaired mobility -  Follow up - - - - Education provided;Falls prevention discussed;Falls evaluation completed  Comment - - - - reveals patient accidentally tripped   Is the patient's home free of loose throw rugs in walkways, pet beds, electrical cords, etc?   yes      Grab bars in the bathroom? yes      Handrails on the stairs?   yes      Adequate lighting?   yes  Timed Get Up and Go performed:  Depression Screen PHQ 2/9 Scores 05/09/2018 03/31/2018 08/06/2017 05/06/2017  PHQ - 2 Score 4 4 2 2   PHQ- 9 Score 9 14 8 3      Cognitive Function     6CIT Screen 05/09/2018 05/06/2017  What Year? 0 points 0 points  What month? 0 points 0 points  What time? 0 points 0 points  Count back from 20 2 points 0 points  Months in reverse 0 points 0 points  Repeat phrase 0 points 0 points  Total Score 2 0    Immunization History  Administered Date(s) Administered  . Influenza Split 09/30/2012  . Influenza Whole 09/24/2007, 11/02/2009, 09/18/2010, 09/18/2011  . Influenza,inj,Quad PF,6+ Mos 12/16/2013, 08/26/2014, 09/07/2015, 09/10/2016, 09/11/2017  . Pneumococcal Conjugate-13 01/26/2015  . Pneumococcal Polysaccharide-23 07/12/2004, 09/18/2011  . Td 07/12/2004  . Zoster 01/05/2007     Qualifies for Shingles Vaccine? yes  Screening Tests Health Maintenance  Topic Date Due  . TETANUS/TDAP  07/12/2014  . INFLUENZA VACCINE  07/31/2018  . FOOT EXAM  09/14/2018  . HEMOGLOBIN A1C  09/30/2018  . OPHTHALMOLOGY EXAM  03/30/2019  . COLONOSCOPY  05/08/2022  . DEXA SCAN  Completed  . PNA vac Low Risk Adult  Completed    Cancer Screenings: Lung: Low Dose CT Chest recommended if Age 20-80 years, 30 pack-year currently smoking OR have quit w/in 15years. Patient does not qualify. Breast:  Up to date on Mammogram? No  Scheduled for Monday Up to date of Bone Density/Dexa? Yes Colorectal: Due 2023  Additional Screenings:  Hepatitis C Screening:      Plan:      I have personally reviewed and noted the following in the patient's chart:   . Medical and social history . Use of alcohol, tobacco or illicit drugs  . Current medications and supplements . Functional ability and status . Nutritional status . Physical activity . Advanced directives . List of other physicians . Hospitalizations, surgeries, and ER visits in previous 12 months . Vitals . Screenings to include cognitive, depression, and falls . Referrals and appointments  In addition, I have reviewed and discussed with patient certain preventive protocols, quality metrics, and best practice recommendations. A written personalized care plan for preventive services as well as general preventive health recommendations were provided to patient.     Kate Sable, LPN, LPN  07/08/2956

## 2018-05-12 ENCOUNTER — Telehealth: Payer: Self-pay | Admitting: Clinical

## 2018-05-12 ENCOUNTER — Encounter (HOSPITAL_COMMUNITY): Payer: Self-pay

## 2018-05-12 ENCOUNTER — Ambulatory Visit (HOSPITAL_COMMUNITY)
Admission: RE | Admit: 2018-05-12 | Discharge: 2018-05-12 | Disposition: A | Discharge status: Home / Self Care | Payer: Medicare Other | Source: Ambulatory Visit | Attending: Family Medicine | Admitting: Family Medicine

## 2018-05-12 ENCOUNTER — Other Ambulatory Visit: Payer: Self-pay | Admitting: Family Medicine

## 2018-05-12 DIAGNOSIS — Z1231 Encounter for screening mammogram for malignant neoplasm of breast: Secondary | ICD-10-CM | POA: Diagnosis not present

## 2018-05-12 NOTE — Telephone Encounter (Signed)
This [facility] specialist left message to call back with name and contact information. 

## 2018-05-23 ENCOUNTER — Other Ambulatory Visit: Payer: Self-pay | Admitting: Gastroenterology

## 2018-06-04 ENCOUNTER — Ambulatory Visit: Payer: Medicare Other | Admitting: Orthopedic Surgery

## 2018-06-04 ENCOUNTER — Encounter: Payer: Self-pay | Admitting: Orthopedic Surgery

## 2018-06-12 ENCOUNTER — Other Ambulatory Visit: Payer: Self-pay | Admitting: Family Medicine

## 2018-06-16 ENCOUNTER — Other Ambulatory Visit: Payer: Self-pay | Admitting: Family Medicine

## 2018-06-20 ENCOUNTER — Ambulatory Visit: Payer: Medicare Other | Admitting: Orthopedic Surgery

## 2018-06-20 ENCOUNTER — Telehealth: Payer: Self-pay | Admitting: Radiology

## 2018-06-20 ENCOUNTER — Encounter: Payer: Self-pay | Admitting: Orthopedic Surgery

## 2018-06-20 VITALS — BP 153/60 | HR 54 | Ht 66.0 in | Wt 242.0 lb

## 2018-06-20 DIAGNOSIS — M65331 Trigger finger, right middle finger: Secondary | ICD-10-CM | POA: Diagnosis not present

## 2018-06-20 DIAGNOSIS — M65332 Trigger finger, left middle finger: Secondary | ICD-10-CM | POA: Diagnosis not present

## 2018-06-20 DIAGNOSIS — M65321 Trigger finger, right index finger: Secondary | ICD-10-CM

## 2018-06-20 MED ORDER — PREDNISONE 5 MG PO TABS: 5.0000 mg | ORAL_TABLET | Freq: Every day | ORAL | 1 refills | Status: DC

## 2018-06-20 NOTE — Patient Instructions (Signed)
[name]  06/20/2018     @PREFPERIOPPHARMACY @   Your procedure is scheduled on  06/26/2018  Report to [facility] at  1130   A.M.  Call this number if you have problems the morning of surgery:  [phone]   Remember:  Do not eat or drink after midnight.  You may drink clear liquids until  12 midnight 06/25/2018  .  Clear liquids allowed are:                    Water    Take these medicines the morning of surgery with A SIP OF WATER  Amlodipine, zyrtec, celexa, cardiazem, neurontin, hydralazine, hydrocodone,zofran, protonix, mestinon, effexor, vesicare.    Do not wear jewelry, make-up or nail polish.  Do not wear lotions, powders, or perfumes, or deodorant.  Do not shave 48 hours prior to surgery.  Men may shave face and neck.  Do not bring valuables to the hospital.  [facility] is not responsible for any belongings or valuables.  Contacts, dentures or bridgework may not be worn into surgery.  Leave your suitcase in the car.  After surgery it may be brought to your room.  For patients admitted to the hospital, discharge time will be determined by your treatment team.  Patients discharged the day of surgery will not be allowed to drive home.   Name and phone number of your driver:   family Special instructions:  None  Please read over the following fact sheets that you were given. Anesthesia Post-op Instructions and Care and Recovery After Surgery      Trigger Finger Trigger finger (stenosing tenosynovitis) is a condition that causes a finger to get stuck in a bent position. Each finger has a tough, cord-like tissue that connects muscle to bone (tendon), and each tendon is surrounded by a tunnel of tissue (tendon sheath). To move your finger, your tendon needs to slide freely through the sheath. Trigger finger happens when the tendon or the sheath thickens, making it difficult to move your finger. Trigger finger can affect any finger or a thumb. It may  affect more than one finger. Mild cases may clear up with rest and medicine. Severe cases require more treatment. What are the causes? Trigger finger is caused by a thickened finger tendon or tendon sheath. The cause of this thickening is not known. What increases the risk? The following factors may make you more likely to develop this condition:  Doing activities that require a strong grip.  Having rheumatoid arthritis, gout, or diabetes.  Being 81-53 years old.  Being a woman.  What are the signs or symptoms? Symptoms of this condition include:  Pain when bending or straightening your finger.  Tenderness or swelling where your finger attaches to the palm of your hand.  A lump in the palm of your hand or on the inside of your finger.  Hearing a popping sound when you try to straighten your finger.  Feeling a popping, catching, or locking sensation when you try to straighten your finger.  Being unable to straighten your finger.  How is this diagnosed? This condition is diagnosed based on your symptoms and a physical exam. How is this treated? This condition may be treated by:  Resting your finger and avoiding activities that make symptoms worse.  Wearing a finger splint to keep your finger in a slightly bent position.  Taking NSAIDs to relieve pain and swelling.  Injecting medicine (steroids) into the tendon sheath to reduce swelling and irritation. Injections may need to be repeated.  Having surgery to open the tendon sheath. This may be done if other treatments do not work and you cannot straighten your finger. You may need physical therapy after surgery.  Follow these instructions at home:  Use moist heat to help reduce pain and swelling as told by your health care provider.  Rest your finger and avoid activities that make pain worse. Return to normal activities as told by your health care provider.  If you have a splint, wear it as told by your health care  provider.  Take over-the-counter and prescription medicines only as told by your health care provider.  Keep all follow-up visits as told by your health care provider. This is important. Contact a health care provider if:  Your symptoms are not improving with home care. Summary  Trigger finger (stenosing tenosynovitis) causes your finger to get stuck in a bent position, and it can make it difficult and painful to straighten your finger.  This condition develops when a finger tendon or tendon sheath thickens.  Treatment starts with resting, wearing a splint, and taking NSAIDs.  In severe cases, surgery to open the tendon sheath may be needed. This information is not intended to replace advice given to you by your health care provider. Make sure you discuss any questions you have with your health care provider. Document Released: 10/06/2004 Document Revised: 11/27/2016 Document Reviewed: 11/27/2016 Elsevier Interactive Patient Education  2017 [employer] Anesthesia is a term that refers to techniques, procedures, and medicines that help a person stay safe and comfortable during a medical procedure. Monitored anesthesia care, or sedation, is one type of anesthesia. Your anesthesia specialist may recommend sedation if you will be having a procedure that does not require you to be unconscious, such as:  Cataract surgery.  A dental procedure.  A biopsy.  A colonoscopy.  During the procedure, you may receive a medicine to help you relax (sedative). There are three levels of sedation:  Mild sedation. At this level, you may feel awake and relaxed. You will be able to follow directions.  Moderate sedation. At this level, you will be sleepy. You may not remember the procedure.  Deep sedation. At this level, you will be asleep. You will not remember the procedure.  The more medicine you are given, the deeper your level of sedation will be. Depending on how  you respond to the procedure, the anesthesia specialist may change your level of sedation or the type of anesthesia to fit your needs. An anesthesia specialist will monitor you closely during the procedure. Let your health care provider know about:  Any allergies you have.  All medicines you are taking, including vitamins, herbs, eye drops, creams, and over-the-counter medicines.  Any use of steroids (by mouth or as a cream).  Any problems you or family members have had with sedatives and anesthetic medicines.  Any blood disorders you have.  Any surgeries you have had.  Any medical conditions you have, such as sleep apnea.  Whether you are pregnant or may be pregnant.  Any use of cigarettes, alcohol, or street drugs. What are the risks? Generally, this is a safe procedure. However, problems may occur, including:  Getting too much medicine (oversedation).  Nausea.  Allergic reaction to medicines.  Trouble breathing. If this happens, a breathing tube may be used to help with breathing. It will be removed  when you are awake and breathing on your own.  Heart trouble.  Lung trouble.  Before the procedure Staying hydrated Follow instructions from your health care provider about hydration, which may include:  Up to 2 hours before the procedure - you may continue to drink clear liquids, such as water, clear fruit juice, black coffee, and plain tea.  Eating and drinking restrictions Follow instructions from your health care provider about eating and drinking, which may include:  8 hours before the procedure - stop eating heavy meals or foods such as meat, fried foods, or fatty foods.  6 hours before the procedure - stop eating light meals or foods, such as toast or cereal.  6 hours before the procedure - stop drinking milk or drinks that contain milk.  2 hours before the procedure - stop drinking clear liquids.  Medicines Ask your health care provider about:  Changing or  stopping your regular medicines. This is especially important if you are taking diabetes medicines or blood thinners.  Taking medicines such as aspirin and ibuprofen. These medicines can thin your blood. Do not take these medicines before your procedure if your health care provider instructs you not to.  Tests and exams  You will have a physical exam.  You may have blood tests done to show: ? How well your kidneys and liver are working. ? How well your blood can clot.  General instructions  Plan to have someone take you home from the hospital or clinic.  If you will be going home right after the procedure, plan to have someone with you for 24 hours.  What happens during the procedure?  Your blood pressure, heart rate, breathing, level of pain and overall condition will be monitored.  An IV tube will be inserted into one of your veins.  Your anesthesia specialist will give you medicines as needed to keep you comfortable during the procedure. This may mean changing the level of sedation.  The procedure will be performed. After the procedure  Your blood pressure, heart rate, breathing rate, and blood oxygen level will be monitored until the medicines you were given have worn off.  Do not drive for 24 hours if you received a sedative.  You may: ? Feel sleepy, clumsy, or nauseous. ? Feel forgetful about what happened after the procedure. ? Have a sore throat if you had a breathing tube during the procedure. ? Vomit. This information is not intended to replace advice given to you by your health care provider. Make sure you discuss any questions you have with your health care provider. Document Released: 09/12/2005 Document Revised: 05/25/2016 Document Reviewed: 04/08/2016 Elsevier Interactive Patient Education  2018 [place], Care After These instructions provide you with information about caring for yourself after your procedure. Your health care  provider may also give you more specific instructions. Your treatment has been planned according to current medical practices, but problems sometimes occur. Call your health care provider if you have any problems or questions after your procedure. What can I expect after the procedure? After your procedure, it is common to:  Feel sleepy for several hours.  Feel clumsy and have poor balance for several hours.  Feel forgetful about what happened after the procedure.  Have poor judgment for several hours.  Feel nauseous or vomit.  Have a sore throat if you had a breathing tube during the procedure.  Follow these instructions at home: For at least 24 hours after the procedure:  Do not: ? Participate in activities in which you could fall or become injured. ? Drive. ? Use heavy machinery. ? Drink alcohol. ? Take sleeping pills or medicines that cause drowsiness. ? Make important decisions or sign legal documents. ? Take care of children on your own.  Rest. Eating and drinking  Follow the diet that is recommended by your health care provider.  If you vomit, drink water, juice, or soup when you can drink without vomiting.  Make sure you have little or no nausea before eating solid foods. General instructions  Have a responsible adult stay with you until you are awake and alert.  Take over-the-counter and prescription medicines only as told by your health care provider.  If you smoke, do not smoke without supervision.  Keep all follow-up visits as told by your health care provider. This is important. Contact a health care provider if:  You keep feeling nauseous or you keep vomiting.  You feel light-headed.  You develop a rash.  You have a fever. Get help right away if:  You have trouble breathing. This information is not intended to replace advice given to you by your health care provider. Make sure you discuss any questions you have with your health care  provider. Document Released: 04/08/2016 Document Revised: 08/08/2016 Document Reviewed: 04/08/2016 Elsevier Interactive Patient Education  [employer].

## 2018-06-20 NOTE — Telephone Encounter (Signed)
[facility] Medicare does not require prior authorization for OP procedure right trigger finger release CPT code [phone]

## 2018-06-20 NOTE — Progress Notes (Signed)
[name]  06/20/2018  HISTORY SECTION :  Chief Complaint  Patient presents with  . Hand Problem    trigger fingers bilateral hands right worse than left    77 year old female with history of multiple fingers catching and triggering  She is had a 3 to 71-month history of catching multiple digits both hands with (mild to moderate pain) (dull pain) painful palm over the A1 pulley of the right long finger right index finger right ring finger left index finger and left long finger.     Review of Systems  Eyes: Positive for blurred vision.  Neurological: Negative for tingling.    Past Medical History:  Diagnosis Date  . Anemia in chronic renal disease 12/30/2015  . Anxiety   . Arthritis    "all over" (01/19/2014)  . Carpal tunnel syndrome   . Chronic bronchitis ([place])    "got it q yr for awhile; hasn't had it in awhile" (01/19/2014)  . Chronic kidney disease (CKD) stage G3b/A1, moderately decreased glomerular filtration rate (GFR) between 30-44 mL/min/1.73 square meter and albuminuria creatinine ratio less than 30 mg/g (HCC) 09/14/2009   Qualifier: Diagnosis of  By: Moshe Cipro MD, Joycelyn Schmid    . Chronic renal disease, stage 3, moderately decreased glomerular filtration rate (GFR) between 30-59 mL/min/1.73 square meter (HCC) 09/14/2009   Qualifier: Diagnosis of  By: Moshe Cipro MD, Joycelyn Schmid    . Depression   . Diverticulosis 07/2004   Colonscopy Dr Gala Romney  . Esophagitis, erosive 2009  . Exertional shortness of breath   . Gastroesophageal reflux   . QZRAQTMA(263.3)    "usually a couple times/wk" (01/19/2014)  . Heart murmur    saw cardiology In [place], he told her she did not need to come back.  . Hyperlipidemia   . Hypertension   . Impingement syndrome, shoulder   . Iron deficiency anemia   . Mitral regurgitation   . Myasthenia gravis   . Myasthenia gravis in remission ([place]) 11/24/2014  . Obesity   . OSA on CPAP   . Pneumonia 01/2012  . Pulmonary HTN ([place])   . Schatzki's  ring    Last EGD w/ dilation 02/08/11, 2009 & 2007  . Seasonal allergies   . Shoulder pain   . Thyroid disease    "used to take RX; they took me off it" (01/19/2014)  . Tobacco abuse   . Type II diabetes mellitus (HCC)     No Known Allergies   Current Outpatient Medications:  .  amLODipine (NORVASC) 10 MG tablet, TAKE 1 TABLET BY MOUTH ONCE A DAY., Disp: 30 tablet, Rfl: 5 .  B-D ULTRAFINE III SHORT PEN 31G X 8 MM MISC, USE ONCE DAILY WITH LANTUS SOLOSTAR PEN., Disp: 100 each, Rfl: 0 .  brimonidine-timolol (COMBIGAN) 0.2-0.5 % ophthalmic solution, Place 1 drop into both eyes every 12 (twelve) hours., Disp: , Rfl:  .  cetirizine (ZYRTEC ALLERGY) 10 MG tablet, Take 1 tablet (10 mg total) by mouth daily., Disp: 30 tablet, Rfl: 5 .  cholecalciferol (VITAMIN D) 1000 units tablet, Take 1,000 Units by mouth daily., Disp: , Rfl:  .  citalopram (CELEXA) 20 MG tablet, TAKE 1 TABLET BY MOUTH ONCE A DAY., Disp: 30 tablet, Rfl: 0 .  Cranberry 1000 MG CAPS, Take 1,000 mg by mouth daily. , Disp: , Rfl:  .  cycloSPORINE (RESTASIS) 0.05 % ophthalmic emulsion, Place 1 drop into both eyes 2 (two) times daily. , Disp: , Rfl:  .  diltiazem (CARDIZEM CD) 240 MG 24 hr  capsule, TAKE (1) CAPSULE BY MOUTH TWICE DAILY., Disp: 60 capsule, Rfl: 0 .  Flaxseed, Linseed, (FLAXSEED OIL) 1000 MG CAPS, Take 1,000 mg by mouth daily., Disp: , Rfl:  .  fluticasone (FLONASE) 50 MCG/ACT nasal spray, Place 2 sprays into both nostrils daily., Disp: 48 g, Rfl: 1 .  gabapentin (NEURONTIN) 300 MG capsule, TAKE (1) CAPSULE BY MOUTH AT BEDTIME., Disp: 30 capsule, Rfl: 0 .  hydrALAZINE (APRESOLINE) 25 MG tablet, TAKE 1 TABLET BY MOUTH THREE TIMES A DAY., Disp: 90 tablet, Rfl: 3 .  HYDROcodone-acetaminophen (NORCO) 10-325 MG tablet, One tablet two times daily, Disp: 60 tablet, Rfl: 0 .  IRON PO, Take 1 tablet by mouth 2 (two) times daily., Disp: , Rfl:  .  LANTUS SOLOSTAR 100 UNIT/ML Solostar Pen, INJECT 30 UNITS SUBCUTANEOUSLY ONCE  DAILY., Disp: 15 mL, Rfl: 0 .  losartan (COZAAR) 100 MG tablet, TAKE (1) TABLET BY MOUTH DAILY., Disp: 30 tablet, Rfl: 0 .  Multiple Vitamin (MULTIVITAMIN WITH MINERALS) TABS tablet, Take 1 tablet by mouth at bedtime., Disp: , Rfl:  .  ondansetron (ZOFRAN) 4 MG tablet, TAKE (1) TABLET BY MOUTH EVERY FOUR HOURS AS NEEDED FOR NAUSEA., Disp: 30 tablet, Rfl: 0 .  ONETOUCH VERIO test strip, USE AS DIRECTED TO TEST BLOOD GLUCOSE 3 TIMES DAILY., Disp: 100 each, Rfl: 11 .  PAIN RELIEF EXTRA STRENGTH 500 MG tablet, TAKE (1) TABLET BY MOUTH AT BEDTIME., Disp: 30 tablet, Rfl: 0 .  pantoprazole (PROTONIX) 40 MG tablet, TAKE 1 TABLET BY MOUTH DAILY BEFORE BREAKFAST., Disp: 30 tablet, Rfl: 5 .  polyethylene glycol powder (GLYCOLAX/MIRALAX) powder, MIX 17G (1 CAPFUL) IN 8 OUNCES OF JUICE OR WATER AND DRINK ONCE DAILY., Disp: 527 g, Rfl: 3 .  prednisoLONE acetate (PRED FORTE) 1 % ophthalmic suspension, Place 1 drop into the left eye 4 (four) times daily., Disp: 5 mL, Rfl: 0 .  predniSONE (DELTASONE) 5 MG tablet, Take 1 tablet (5 mg total) by mouth daily., Disp: 90 tablet, Rfl: 1 .  PROAIR HFA 108 (90 Base) MCG/ACT inhaler, INHALE 2 PUFFS BY MOUTH EVERY 6 HOURS AS NEEDED ., Disp: 8.5 g, Rfl: 0 .  pyridostigmine (MESTINON) 60 MG tablet, TAKE (1) TABLET BY MOUTH THREE TIMES A DAY., Disp: 90 tablet, Rfl: 0 .  rosuvastatin (CRESTOR) 40 MG tablet, TAKE (1) TABLET BY MOUTH AT BEDTIME., Disp: 30 tablet, Rfl: 5 .  spironolactone (ALDACTONE) 25 MG tablet, TAKE 1 TABLET BY MOUTH ONCE A DAY., Disp: 30 tablet, Rfl: 0 .  TRADJENTA 5 MG TABS tablet, TAKE 1 TABLET BY MOUTH ONCE A DAY., Disp: 30 tablet, Rfl: 0 .  venlafaxine XR (EFFEXOR-XR) 150 MG 24 hr capsule, TAKE 1 CAPSULE BY MOUTH DAILY., Disp: 30 capsule, Rfl: 0 .  VESICARE 10 MG tablet, TAKE 1 TABLET BY MOUTH ONCE A DAY., Disp: 30 tablet, Rfl: 0 .  Vitamin D, Ergocalciferol, (DRISDOL) 50000 units CAPS capsule, TAKE 1 CAPSULE BY MOUTH ONCE A WEEK. (Patient not taking:  Reported on 06/20/2018), Disp: 4 capsule, Rfl: 3   PHYSICAL EXAM SECTION: BP (!) 153/60   Pulse (!) 54   Ht 5\' 6"  (1.676 m)   Wt 242 lb (109.8 kg)   BMI 39.06 kg/m   General appearance the patient is normally developed grooming and hygiene are normal  Oriented x3  Mood pleasant affect normal  Gait she does require the use of a cane but no noticeable limp her gait  shows slow cadence.  Right hand alignment normal Tenderness over  the A1 pulley right long finger right index finger right ring finger  She does exhibit catching of the index finger.  The joints are stable flexor tendon strength is normal her skin is intact there is no erythema she has a good perfusion normal pulse and normal sensation  On the opposite hand which is the left hand the left long finger A1 pulley is tender left ring finger A1 pulley is tender Range of motion is normal Stability of the joints is normal Flexor strength is normal Skin shows no laceration or erythema Capillary refill is normal pulses excellent Sensation is normal Alignment is normal   MEDICAL DECISION SECTION:  Encounter Diagnosis  Name Primary?  . Trigger index finger of right hand Yes    Imaging no  Plan:  (Rx., Inj., surg., Frx, MRI/CT, XR:2) Injection right and left long finger  Trigger finger injection  Diagnosis tenosynovitis of the right long finger  procedure injection A1 pulley Medications lidocaine 1% 1 mL and Depo-Medrol 40 mg 1 mL Skin prep alcohol and ethyl chloride Verbal consent was obtained Timeout confirmed the injection site  After cleaning the skin with alcohol and anesthetizing the skin with ethyl chloride the A1 pulley was palpated and the injection was performed without complication  Trigger finger injection  Diagnosis no synovitis left long finger Procedure injection A1 pulley Medications lidocaine 1% 1 mL and Depo-Medrol 40 mg 1 mL Skin prep alcohol and ethyl chloride Verbal consent was  obtained Timeout confirmed the injection site  After cleaning the skin with alcohol and anesthetizing the skin with ethyl chloride the A1 pulley was palpated and the injection was performed without complication  The procedure has been fully reviewed with the patient; The risks and benefits of surgery have been discussed and explained and understood. Alternative treatment has also been reviewed, questions were encouraged and answered. The postoperative plan is also been reviewed.   Planned procedure release index finger for triggering right index finger

## 2018-06-20 NOTE — Patient Instructions (Signed)
Trigger Finger Trigger finger (stenosing tenosynovitis) is a condition that causes a finger to get stuck in a bent position. Each finger has a tough, cord-like tissue that connects muscle to bone (tendon), and each tendon is surrounded by a tunnel of tissue (tendon sheath). To move your finger, your tendon needs to slide freely through the sheath. Trigger finger happens when the tendon or the sheath thickens, making it difficult to move your finger. Trigger finger can affect any finger or a thumb. It may affect more than one finger. Mild cases may clear up with rest and medicine. Severe cases require more treatment. What are the causes? Trigger finger is caused by a thickened finger tendon or tendon sheath. The cause of this thickening is not known. What increases the risk? The following factors may make you more likely to develop this condition:  Doing activities that require a strong grip.  Having rheumatoid arthritis, gout, or diabetes.  Being 40-60 years old.  Being a woman.  What are the signs or symptoms? Symptoms of this condition include:  Pain when bending or straightening your finger.  Tenderness or swelling where your finger attaches to the palm of your hand.  A lump in the palm of your hand or on the inside of your finger.  Hearing a popping sound when you try to straighten your finger.  Feeling a popping, catching, or locking sensation when you try to straighten your finger.  Being unable to straighten your finger.  How is this diagnosed? This condition is diagnosed based on your symptoms and a physical exam. How is this treated? This condition may be treated by:  Resting your finger and avoiding activities that make symptoms worse.  Wearing a finger splint to keep your finger in a slightly bent position.  Taking NSAIDs to relieve pain and swelling.  Injecting medicine (steroids) into the tendon sheath to reduce swelling and irritation. Injections may need to be  repeated.  Having surgery to open the tendon sheath. This may be done if other treatments do not work and you cannot straighten your finger. You may need physical therapy after surgery.  Follow these instructions at home:  Use moist heat to help reduce pain and swelling as told by your health care provider.  Rest your finger and avoid activities that make pain worse. Return to normal activities as told by your health care provider.  If you have a splint, wear it as told by your health care provider.  Take over-the-counter and prescription medicines only as told by your health care provider.  Keep all follow-up visits as told by your health care provider. This is important. Contact a health care provider if:  Your symptoms are not improving with home care. Summary  Trigger finger (stenosing tenosynovitis) causes your finger to get stuck in a bent position, and it can make it difficult and painful to straighten your finger.  This condition develops when a finger tendon or tendon sheath thickens.  Treatment starts with resting, wearing a splint, and taking NSAIDs.  In severe cases, surgery to open the tendon sheath may be needed. This information is not intended to replace advice given to you by your health care provider. Make sure you discuss any questions you have with your health care provider. Document Released: 10/06/2004 Document Revised: 11/27/2016 Document Reviewed: 11/27/2016 Elsevier Interactive Patient Education  2017 [employer]  

## 2018-06-23 ENCOUNTER — Ambulatory Visit: Payer: Medicare Other | Admitting: Family Medicine

## 2018-06-24 ENCOUNTER — Encounter (HOSPITAL_COMMUNITY): Payer: Self-pay

## 2018-06-24 ENCOUNTER — Other Ambulatory Visit: Payer: Self-pay

## 2018-06-24 ENCOUNTER — Encounter (HOSPITAL_COMMUNITY)
Admission: RE | Admit: 2018-06-24 | Discharge: 2018-06-24 | Disposition: A | Discharge status: Home / Self Care | Payer: Medicare Other | Source: Ambulatory Visit | Attending: Orthopedic Surgery | Admitting: Orthopedic Surgery

## 2018-06-24 DIAGNOSIS — Z01812 Encounter for preprocedural laboratory examination: Secondary | ICD-10-CM | POA: Insufficient documentation

## 2018-06-24 DIAGNOSIS — Z79899 Other long term (current) drug therapy: Secondary | ICD-10-CM | POA: Diagnosis not present

## 2018-06-24 DIAGNOSIS — I129 Hypertensive chronic kidney disease with stage 1 through stage 4 chronic kidney disease, or unspecified chronic kidney disease: Secondary | ICD-10-CM | POA: Diagnosis not present

## 2018-06-24 DIAGNOSIS — Z8711 Personal history of peptic ulcer disease: Secondary | ICD-10-CM | POA: Diagnosis not present

## 2018-06-24 DIAGNOSIS — F419 Anxiety disorder, unspecified: Secondary | ICD-10-CM | POA: Diagnosis not present

## 2018-06-24 DIAGNOSIS — I340 Nonrheumatic mitral (valve) insufficiency: Secondary | ICD-10-CM | POA: Diagnosis not present

## 2018-06-24 DIAGNOSIS — E1122 Type 2 diabetes mellitus with diabetic chronic kidney disease: Secondary | ICD-10-CM | POA: Diagnosis not present

## 2018-06-24 DIAGNOSIS — G7000 Myasthenia gravis without (acute) exacerbation: Secondary | ICD-10-CM | POA: Diagnosis not present

## 2018-06-24 DIAGNOSIS — F329 Major depressive disorder, single episode, unspecified: Secondary | ICD-10-CM | POA: Diagnosis not present

## 2018-06-24 DIAGNOSIS — M65321 Trigger finger, right index finger: Secondary | ICD-10-CM | POA: Diagnosis not present

## 2018-06-24 DIAGNOSIS — Z794 Long term (current) use of insulin: Secondary | ICD-10-CM | POA: Diagnosis not present

## 2018-06-24 DIAGNOSIS — Z01810 Encounter for preprocedural cardiovascular examination: Secondary | ICD-10-CM

## 2018-06-24 DIAGNOSIS — N183 Chronic kidney disease, stage 3 (moderate): Secondary | ICD-10-CM | POA: Diagnosis not present

## 2018-06-24 DIAGNOSIS — Z87891 Personal history of nicotine dependence: Secondary | ICD-10-CM | POA: Diagnosis not present

## 2018-06-24 DIAGNOSIS — I272 Pulmonary hypertension, unspecified: Secondary | ICD-10-CM | POA: Diagnosis not present

## 2018-06-24 DIAGNOSIS — Z9989 Dependence on other enabling machines and devices: Secondary | ICD-10-CM | POA: Diagnosis not present

## 2018-06-24 DIAGNOSIS — R011 Cardiac murmur, unspecified: Secondary | ICD-10-CM | POA: Diagnosis not present

## 2018-06-24 DIAGNOSIS — Z8719 Personal history of other diseases of the digestive system: Secondary | ICD-10-CM | POA: Diagnosis not present

## 2018-06-24 DIAGNOSIS — K219 Gastro-esophageal reflux disease without esophagitis: Secondary | ICD-10-CM | POA: Diagnosis not present

## 2018-06-24 DIAGNOSIS — E785 Hyperlipidemia, unspecified: Secondary | ICD-10-CM | POA: Diagnosis not present

## 2018-06-24 DIAGNOSIS — M1990 Unspecified osteoarthritis, unspecified site: Secondary | ICD-10-CM | POA: Diagnosis not present

## 2018-06-24 DIAGNOSIS — Z6839 Body mass index (BMI) 39.0-39.9, adult: Secondary | ICD-10-CM | POA: Diagnosis not present

## 2018-06-24 DIAGNOSIS — G4733 Obstructive sleep apnea (adult) (pediatric): Secondary | ICD-10-CM | POA: Diagnosis not present

## 2018-06-24 LAB — CBC WITH DIFFERENTIAL/PLATELET
BASOS ABS: 0 10*3/uL (ref 0.0–0.1)
BASOS PCT: 0 %
Eosinophils Absolute: 0.3 10*3/uL (ref 0.0–0.7)
Eosinophils Relative: 4 %
HEMATOCRIT: 29 % — AB (ref 36.0–46.0)
HEMOGLOBIN: 8.8 g/dL — AB (ref 12.0–15.0)
Lymphocytes Relative: 23 %
Lymphs Abs: 1.9 10*3/uL (ref 0.7–4.0)
MCH: 26.7 pg (ref 26.0–34.0)
MCHC: 30.3 g/dL (ref 30.0–36.0)
MCV: 87.9 fL (ref 78.0–100.0)
Monocytes Absolute: 0.8 10*3/uL (ref 0.1–1.0)
Monocytes Relative: 10 %
NEUTROS ABS: 5.4 10*3/uL (ref 1.7–7.7)
NEUTROS PCT: 63 %
Platelets: 334 10*3/uL (ref 150–400)
RBC: 3.3 MIL/uL — ABNORMAL LOW (ref 3.87–5.11)
RDW: 17.5 % — ABNORMAL HIGH (ref 11.5–15.5)
WBC: 8.5 10*3/uL (ref 4.0–10.5)

## 2018-06-24 LAB — BASIC METABOLIC PANEL WITH GFR
Anion gap: 7 (ref 5–15)
BUN: 22 mg/dL (ref 8–23)
CO2: 26 mmol/L (ref 22–32)
Calcium: 9.1 mg/dL (ref 8.9–10.3)
Chloride: 109 mmol/L (ref 98–111)
Creatinine, Ser: 1.04 mg/dL — ABNORMAL HIGH (ref 0.44–1.00)
GFR calc Af Amer: 59 mL/min — ABNORMAL LOW
GFR calc non Af Amer: 51 mL/min — ABNORMAL LOW
Glucose, Bld: 103 mg/dL — ABNORMAL HIGH (ref 70–99)
Potassium: 3.7 mmol/L (ref 3.5–5.1)
Sodium: 142 mmol/L (ref 135–145)

## 2018-06-24 LAB — HEMOGLOBIN A1C
HEMOGLOBIN A1C: 6.2 % — AB (ref 4.8–5.6)
Mean Plasma Glucose: 131.24 mg/dL

## 2018-06-24 LAB — GLUCOSE, CAPILLARY: Glucose-Capillary: 101 mg/dL — ABNORMAL HIGH (ref 70–99)

## 2018-06-25 NOTE — H&P (Signed)
[name]   06/20/2018   HISTORY SECTION :       Chief Complaint  Patient presents with  . Hand Problem      trigger fingers bilateral hands right worse than left     77 year old female with history of multiple fingers catching and triggering   She is had a 3 to 40-month history of catching multiple digits both hands with (mild to moderate pain) (dull pain) painful palm over the A1 pulley of the right long finger right index finger right ring finger left index finger and left long finger.         Review of Systems  Eyes: Positive for blurred vision.  Neurological: Negative for tingling.          Past Medical History:  Diagnosis Date  . Anemia in chronic renal disease 12/30/2015  . Anxiety    . Arthritis      "all over" (01/19/2014)  . Carpal tunnel syndrome    . Chronic bronchitis ([place])      "got it q yr for awhile; hasn't had it in awhile" (01/19/2014)  . Chronic kidney disease (CKD) stage G3b/A1, moderately decreased glomerular filtration rate (GFR) between 30-44 mL/min/1.73 square meter and albuminuria creatinine ratio less than 30 mg/g (HCC) 09/14/2009    Qualifier: Diagnosis of  By: Moshe Cipro MD, Joycelyn Schmid    . Chronic renal disease, stage 3, moderately decreased glomerular filtration rate (GFR) between 30-59 mL/min/1.73 square meter (HCC) 09/14/2009    Qualifier: Diagnosis of  By: Moshe Cipro MD, Joycelyn Schmid    . Depression    . Diverticulosis 07/2004    Colonscopy Dr Gala Romney  . Esophagitis, erosive 2009  . Exertional shortness of breath    . Gastroesophageal reflux    . GGEZMOQH(476.5)      "usually a couple times/wk" (01/19/2014)  . Heart murmur      saw cardiology In [place], he told her she did not need to come back.  . Hyperlipidemia    . Hypertension    . Impingement syndrome, shoulder    . Iron deficiency anemia    . Mitral regurgitation    . Myasthenia gravis    . Myasthenia gravis in remission ([place]) 11/24/2014  . Obesity    . OSA on CPAP    . Pneumonia  01/2012  . Pulmonary HTN ([place])    . Schatzki's ring      Last EGD w/ dilation 02/08/11, 2009 & 2007  . Seasonal allergies    . Shoulder pain    . Thyroid disease      "used to take RX; they took me off it" (01/19/2014)  . Tobacco abuse    . Type II diabetes mellitus (HCC)        No Known Allergies     Current Outpatient Medications:  .  amLODipine (NORVASC) 10 MG tablet, TAKE 1 TABLET BY MOUTH ONCE A DAY., Disp: 30 tablet, Rfl: 5 .  B-D ULTRAFINE III SHORT PEN 31G X 8 MM MISC, USE ONCE DAILY WITH LANTUS SOLOSTAR PEN., Disp: 100 each, Rfl: 0 .  brimonidine-timolol (COMBIGAN) 0.2-0.5 % ophthalmic solution, Place 1 drop into both eyes every 12 (twelve) hours., Disp: , Rfl:  .  cetirizine (ZYRTEC ALLERGY) 10 MG tablet, Take 1 tablet (10 mg total) by mouth daily., Disp: 30 tablet, Rfl: 5 .  cholecalciferol (VITAMIN D) 1000 units tablet, Take 1,000 Units by mouth daily., Disp: , Rfl:  .  citalopram (CELEXA) 20 MG tablet, TAKE 1 TABLET BY  MOUTH ONCE A DAY., Disp: 30 tablet, Rfl: 0 .  Cranberry 1000 MG CAPS, Take 1,000 mg by mouth daily. , Disp: , Rfl:  .  cycloSPORINE (RESTASIS) 0.05 % ophthalmic emulsion, Place 1 drop into both eyes 2 (two) times daily. , Disp: , Rfl:  .  diltiazem (CARDIZEM CD) 240 MG 24 hr capsule, TAKE (1) CAPSULE BY MOUTH TWICE DAILY., Disp: 60 capsule, Rfl: 0 .  Flaxseed, Linseed, (FLAXSEED OIL) 1000 MG CAPS, Take 1,000 mg by mouth daily., Disp: , Rfl:  .  fluticasone (FLONASE) 50 MCG/ACT nasal spray, Place 2 sprays into both nostrils daily., Disp: 48 g, Rfl: 1 .  gabapentin (NEURONTIN) 300 MG capsule, TAKE (1) CAPSULE BY MOUTH AT BEDTIME., Disp: 30 capsule, Rfl: 0 .  hydrALAZINE (APRESOLINE) 25 MG tablet, TAKE 1 TABLET BY MOUTH THREE TIMES A DAY., Disp: 90 tablet, Rfl: 3 .  HYDROcodone-acetaminophen (NORCO) 10-325 MG tablet, One tablet two times daily, Disp: 60 tablet, Rfl: 0 .  IRON PO, Take 1 tablet by mouth 2 (two) times daily., Disp: , Rfl:  .  LANTUS SOLOSTAR 100  UNIT/ML Solostar Pen, INJECT 30 UNITS SUBCUTANEOUSLY ONCE DAILY., Disp: 15 mL, Rfl: 0 .  losartan (COZAAR) 100 MG tablet, TAKE (1) TABLET BY MOUTH DAILY., Disp: 30 tablet, Rfl: 0 .  Multiple Vitamin (MULTIVITAMIN WITH MINERALS) TABS tablet, Take 1 tablet by mouth at bedtime., Disp: , Rfl:  .  ondansetron (ZOFRAN) 4 MG tablet, TAKE (1) TABLET BY MOUTH EVERY FOUR HOURS AS NEEDED FOR NAUSEA., Disp: 30 tablet, Rfl: 0 .  ONETOUCH VERIO test strip, USE AS DIRECTED TO TEST BLOOD GLUCOSE 3 TIMES DAILY., Disp: 100 each, Rfl: 11 .  PAIN RELIEF EXTRA STRENGTH 500 MG tablet, TAKE (1) TABLET BY MOUTH AT BEDTIME., Disp: 30 tablet, Rfl: 0 .  pantoprazole (PROTONIX) 40 MG tablet, TAKE 1 TABLET BY MOUTH DAILY BEFORE BREAKFAST., Disp: 30 tablet, Rfl: 5 .  polyethylene glycol powder (GLYCOLAX/MIRALAX) powder, MIX 17G (1 CAPFUL) IN 8 OUNCES OF JUICE OR WATER AND DRINK ONCE DAILY., Disp: 527 g, Rfl: 3 .  prednisoLONE acetate (PRED FORTE) 1 % ophthalmic suspension, Place 1 drop into the left eye 4 (four) times daily., Disp: 5 mL, Rfl: 0 .  predniSONE (DELTASONE) 5 MG tablet, Take 1 tablet (5 mg total) by mouth daily., Disp: 90 tablet, Rfl: 1 .  PROAIR HFA 108 (90 Base) MCG/ACT inhaler, INHALE 2 PUFFS BY MOUTH EVERY 6 HOURS AS NEEDED ., Disp: 8.5 g, Rfl: 0 .  pyridostigmine (MESTINON) 60 MG tablet, TAKE (1) TABLET BY MOUTH THREE TIMES A DAY., Disp: 90 tablet, Rfl: 0 .  rosuvastatin (CRESTOR) 40 MG tablet, TAKE (1) TABLET BY MOUTH AT BEDTIME., Disp: 30 tablet, Rfl: 5 .  spironolactone (ALDACTONE) 25 MG tablet, TAKE 1 TABLET BY MOUTH ONCE A DAY., Disp: 30 tablet, Rfl: 0 .  TRADJENTA 5 MG TABS tablet, TAKE 1 TABLET BY MOUTH ONCE A DAY., Disp: 30 tablet, Rfl: 0 .  venlafaxine XR (EFFEXOR-XR) 150 MG 24 hr capsule, TAKE 1 CAPSULE BY MOUTH DAILY., Disp: 30 capsule, Rfl: 0 .  VESICARE 10 MG tablet, TAKE 1 TABLET BY MOUTH ONCE A DAY., Disp: 30 tablet, Rfl: 0 .  Vitamin D, Ergocalciferol, (DRISDOL) 50000 units CAPS capsule, TAKE 1  CAPSULE BY MOUTH ONCE A WEEK. (Patient not taking: Reported on 06/20/2018), Disp: 4 capsule, Rfl: 3     PHYSICAL EXAM SECTION: BP (!) 153/60   Pulse (!) 54   Ht 5\' 6"  (1.676 m)  Wt 242 lb (109.8 kg)   BMI 39.06 kg/m    General appearance the patient is normally developed grooming and hygiene are normal   Oriented x3   Mood pleasant affect normal   Gait she does require the use of a cane but no noticeable limp her gait  shows slow cadence.   Right hand alignment normal Tenderness over the A1 pulley right long finger right index finger right ring finger   She does exhibit catching of the index finger.   The joints are stable flexor tendon strength is normal her skin is intact there is no erythema she has a good perfusion normal pulse and normal sensation   On the opposite hand which is the left hand the left long finger A1 pulley is tender left ring finger A1 pulley is tender Range of motion is normal Stability of the joints is normal Flexor strength is normal Skin shows no laceration or erythema Capillary refill is normal pulses excellent Sensation is normal Alignment is normal     MEDICAL DECISION SECTION:      Encounter Diagnosis  Name Primary?  . Trigger index finger of right hand Yes      Imaging no   Plan:  (Rx., Inj., surg., Frx, MRI/CT, XR:2) Injection right and left long finger   Trigger finger injection   Diagnosis tenosynovitis of the right long finger  procedure injection A1 pulley Medications lidocaine 1% 1 mL and Depo-Medrol 40 mg 1 mL Skin prep alcohol and ethyl chloride Verbal consent was obtained Timeout confirmed the injection site   After cleaning the skin with alcohol and anesthetizing the skin with ethyl chloride the A1 pulley was palpated and the injection was performed without complication   Trigger finger injection   Diagnosis no synovitis left long finger Procedure injection A1 pulley Medications lidocaine 1% 1 mL and Depo-Medrol  40 mg 1 mL Skin prep alcohol and ethyl chloride Verbal consent was obtained Timeout confirmed the injection site   After cleaning the skin with alcohol and anesthetizing the skin with ethyl chloride the A1 pulley was palpated and the injection was performed without complication   The procedure has been fully reviewed with the patient; The risks and benefits of surgery have been discussed and explained and understood. Alternative treatment has also been reviewed, questions were encouraged and answered. The postoperative plan is also been reviewed.     Planned procedure release index finger for triggering right index finger

## 2018-06-25 NOTE — Pre-Procedure Instructions (Signed)
Dr Aline Brochure aware of H&H. No orders given at this time.

## 2018-06-25 NOTE — Pre-Procedure Instructions (Signed)
HgbA1C routed to PCP. 

## 2018-06-25 NOTE — Pre-Procedure Instructions (Signed)
Dr Rick Duff aware of H&H. No orders given at this time.

## 2018-06-26 ENCOUNTER — Encounter (HOSPITAL_COMMUNITY)
Admission: RE | Disposition: A | Discharge status: Home / Self Care | Payer: Self-pay | Source: Ambulatory Visit | Attending: Orthopedic Surgery

## 2018-06-26 ENCOUNTER — Ambulatory Visit (HOSPITAL_COMMUNITY): Payer: Medicare Other | Admitting: Anesthesiology

## 2018-06-26 ENCOUNTER — Other Ambulatory Visit: Payer: Self-pay

## 2018-06-26 ENCOUNTER — Encounter (HOSPITAL_COMMUNITY): Payer: Self-pay | Admitting: *Deleted

## 2018-06-26 ENCOUNTER — Ambulatory Visit (HOSPITAL_COMMUNITY)
Admission: RE | Admit: 2018-06-26 | Discharge: 2018-06-26 | Disposition: A | Discharge status: Home / Self Care | Payer: Medicare Other | Source: Ambulatory Visit | Attending: Orthopedic Surgery | Admitting: Orthopedic Surgery

## 2018-06-26 DIAGNOSIS — Z9989 Dependence on other enabling machines and devices: Secondary | ICD-10-CM | POA: Insufficient documentation

## 2018-06-26 DIAGNOSIS — G4733 Obstructive sleep apnea (adult) (pediatric): Secondary | ICD-10-CM | POA: Insufficient documentation

## 2018-06-26 DIAGNOSIS — Z8711 Personal history of peptic ulcer disease: Secondary | ICD-10-CM | POA: Insufficient documentation

## 2018-06-26 DIAGNOSIS — I340 Nonrheumatic mitral (valve) insufficiency: Secondary | ICD-10-CM | POA: Diagnosis not present

## 2018-06-26 DIAGNOSIS — K219 Gastro-esophageal reflux disease without esophagitis: Secondary | ICD-10-CM | POA: Diagnosis not present

## 2018-06-26 DIAGNOSIS — M1990 Unspecified osteoarthritis, unspecified site: Secondary | ICD-10-CM | POA: Insufficient documentation

## 2018-06-26 DIAGNOSIS — Z87891 Personal history of nicotine dependence: Secondary | ICD-10-CM | POA: Diagnosis not present

## 2018-06-26 DIAGNOSIS — Z794 Long term (current) use of insulin: Secondary | ICD-10-CM | POA: Insufficient documentation

## 2018-06-26 DIAGNOSIS — E1122 Type 2 diabetes mellitus with diabetic chronic kidney disease: Secondary | ICD-10-CM | POA: Diagnosis not present

## 2018-06-26 DIAGNOSIS — E785 Hyperlipidemia, unspecified: Secondary | ICD-10-CM | POA: Diagnosis not present

## 2018-06-26 DIAGNOSIS — I272 Pulmonary hypertension, unspecified: Secondary | ICD-10-CM | POA: Diagnosis not present

## 2018-06-26 DIAGNOSIS — Z79899 Other long term (current) drug therapy: Secondary | ICD-10-CM | POA: Diagnosis not present

## 2018-06-26 DIAGNOSIS — F329 Major depressive disorder, single episode, unspecified: Secondary | ICD-10-CM | POA: Insufficient documentation

## 2018-06-26 DIAGNOSIS — Z8719 Personal history of other diseases of the digestive system: Secondary | ICD-10-CM | POA: Diagnosis not present

## 2018-06-26 DIAGNOSIS — I129 Hypertensive chronic kidney disease with stage 1 through stage 4 chronic kidney disease, or unspecified chronic kidney disease: Secondary | ICD-10-CM | POA: Diagnosis not present

## 2018-06-26 DIAGNOSIS — M65321 Trigger finger, right index finger: Secondary | ICD-10-CM

## 2018-06-26 DIAGNOSIS — Z6839 Body mass index (BMI) 39.0-39.9, adult: Secondary | ICD-10-CM | POA: Insufficient documentation

## 2018-06-26 DIAGNOSIS — N183 Chronic kidney disease, stage 3 (moderate): Secondary | ICD-10-CM | POA: Diagnosis not present

## 2018-06-26 DIAGNOSIS — G7000 Myasthenia gravis without (acute) exacerbation: Secondary | ICD-10-CM | POA: Diagnosis not present

## 2018-06-26 DIAGNOSIS — R011 Cardiac murmur, unspecified: Secondary | ICD-10-CM | POA: Insufficient documentation

## 2018-06-26 DIAGNOSIS — F419 Anxiety disorder, unspecified: Secondary | ICD-10-CM | POA: Insufficient documentation

## 2018-06-26 HISTORY — PX: TRIGGER FINGER RELEASE: SHX641

## 2018-06-26 HISTORY — DX: Other complications of anesthesia, initial encounter: T88.59XA

## 2018-06-26 HISTORY — DX: Adverse effect of unspecified anesthetic, initial encounter: T41.45XA

## 2018-06-26 LAB — GLUCOSE, CAPILLARY: Glucose-Capillary: 99 mg/dL (ref 70–99)

## 2018-06-26 SURGERY — RELEASE, A1 PULLEY, FOR TRIGGER FINGER
Anesthesia: Monitor Anesthesia Care | Laterality: Right

## 2018-06-26 MED ORDER — PROPOFOL 10 MG/ML IV BOLUS
INTRAVENOUS | Status: DC | PRN
  Administered 2018-06-26: 10 mg via INTRAVENOUS
  Administered 2018-06-26: 20 mg via INTRAVENOUS
  Administered 2018-06-26: 30 mg via INTRAVENOUS

## 2018-06-26 MED ORDER — BUPIVACAINE HCL (PF) 0.5 % IJ SOLN
INTRAMUSCULAR | Status: DC | PRN
  Administered 2018-06-26: 10 mL

## 2018-06-26 MED ORDER — LIDOCAINE HCL 1 % IJ SOLN
INTRAMUSCULAR | Status: DC | PRN
  Administered 2018-06-26: 20 mg via INTRADERMAL

## 2018-06-26 MED ORDER — ONDANSETRON HCL 4 MG/2ML IJ SOLN: 4.0000 mg | Freq: Once | INTRAMUSCULAR | Status: DC | PRN

## 2018-06-26 MED ORDER — MIDAZOLAM HCL 2 MG/2ML IJ SOLN
INTRAMUSCULAR | Status: DC | PRN
  Administered 2018-06-26: 2 mg via INTRAVENOUS

## 2018-06-26 MED ORDER — LACTATED RINGERS IV SOLN
INTRAVENOUS | Status: DC
  Administered 2018-06-26: 12:00:00 via INTRAVENOUS

## 2018-06-26 MED ORDER — CEFAZOLIN SODIUM-DEXTROSE 2-4 GM/100ML-% IV SOLN
INTRAVENOUS | Status: AC
  Filled 2018-06-26: qty 100

## 2018-06-26 MED ORDER — CEFAZOLIN SODIUM-DEXTROSE 2-4 GM/100ML-% IV SOLN
2.0000 g | INTRAVENOUS | Status: AC
  Administered 2018-06-26: 2 g via INTRAVENOUS

## 2018-06-26 MED ORDER — MEPERIDINE HCL 50 MG/ML IJ SOLN: 6.2500 mg | INTRAMUSCULAR | Status: DC | PRN

## 2018-06-26 MED ORDER — BUPIVACAINE HCL (PF) 0.5 % IJ SOLN
INTRAMUSCULAR | Status: AC
  Filled 2018-06-26: qty 30

## 2018-06-26 MED ORDER — LIDOCAINE HCL (PF) 0.5 % IJ SOLN
INTRAMUSCULAR | Status: DC | PRN
  Administered 2018-06-26: 40 mL via INTRAVENOUS

## 2018-06-26 MED ORDER — SODIUM CHLORIDE 0.9 % IR SOLN
Status: DC | PRN
  Administered 2018-06-26: 1000 mL

## 2018-06-26 MED ORDER — HYDROCODONE-ACETAMINOPHEN 7.5-325 MG PO TABS: 1.0000 | ORAL_TABLET | Freq: Once | ORAL | Status: DC | PRN

## 2018-06-26 MED ORDER — MIDAZOLAM HCL 2 MG/2ML IJ SOLN
INTRAMUSCULAR | Status: AC
  Filled 2018-06-26: qty 2

## 2018-06-26 MED ORDER — CHLORHEXIDINE GLUCONATE 4 % EX LIQD: 60.0000 mL | Freq: Once | CUTANEOUS | Status: DC

## 2018-06-26 MED ORDER — HYDROMORPHONE HCL 1 MG/ML IJ SOLN: 0.2500 mg | INTRAMUSCULAR | Status: DC | PRN

## 2018-06-26 SURGICAL SUPPLY — 39 items
BANDAGE ELASTIC 2 LF NS (GAUZE/BANDAGES/DRESSINGS) ×3 IMPLANT
BANDAGE ESMARK 4X12 BL STRL LF (DISPOSABLE) ×1 IMPLANT
BLADE SURG 15 STRL LF DISP TIS (BLADE) ×1 IMPLANT
BLADE SURG 15 STRL SS (BLADE) ×3
BNDG CMPR 12X4 ELC STRL LF (DISPOSABLE) ×1
BNDG CMPR MED 5X2 ELC HKLP NS (GAUZE/BANDAGES/DRESSINGS) ×1
BNDG CONFORM 2 STRL LF (GAUZE/BANDAGES/DRESSINGS) ×3 IMPLANT
BNDG ESMARK 4X12 BLUE STRL LF (DISPOSABLE) ×3
CHLORAPREP W/TINT 26ML (MISCELLANEOUS) ×3 IMPLANT
CLOTH BEACON ORANGE TIMEOUT ST (SAFETY) ×3 IMPLANT
COVER LIGHT HANDLE STERIS (MISCELLANEOUS) ×6 IMPLANT
CUFF TOURNIQUET SINGLE 24IN (TOURNIQUET CUFF) ×2 IMPLANT
DECANTER SPIKE VIAL GLASS SM (MISCELLANEOUS) ×3 IMPLANT
DRAPE HALF SHEET 40X57 (DRAPES) ×3 IMPLANT
DRSG XEROFORM 1X8 (GAUZE/BANDAGES/DRESSINGS) ×2 IMPLANT
ELECT NDL TIP 2.8 STRL (NEEDLE) ×1 IMPLANT
ELECT NEEDLE TIP 2.8 STRL (NEEDLE) ×3 IMPLANT
ELECT REM PT RETURN 9FT ADLT (ELECTROSURGICAL) ×3
ELECTRODE REM PT RTRN 9FT ADLT (ELECTROSURGICAL) ×1 IMPLANT
GLOVE BIOGEL PI IND STRL 7.0 (GLOVE) ×1 IMPLANT
GLOVE BIOGEL PI INDICATOR 7.0 (GLOVE) ×2
GLOVE ECLIPSE 6.5 STRL STRAW (GLOVE) ×2 IMPLANT
GLOVE SKINSENSE NS SZ8.0 LF (GLOVE) ×2
GLOVE SKINSENSE STRL SZ8.0 LF (GLOVE) ×1 IMPLANT
GLOVE SS N UNI LF 8.5 STRL (GLOVE) ×3 IMPLANT
GOWN STRL REUS W/TWL LRG LVL3 (GOWN DISPOSABLE) ×3 IMPLANT
GOWN STRL REUS W/TWL XL LVL3 (GOWN DISPOSABLE) ×3 IMPLANT
KIT TURNOVER KIT A (KITS) ×3 IMPLANT
MANIFOLD NEPTUNE II (INSTRUMENTS) ×3 IMPLANT
NDL HYPO 21X1.5 SAFETY (NEEDLE) ×1 IMPLANT
NEEDLE HYPO 21X1.5 SAFETY (NEEDLE) ×3 IMPLANT
NS IRRIG 1000ML POUR BTL (IV SOLUTION) ×3 IMPLANT
PACK BASIC LIMB (CUSTOM PROCEDURE TRAY) ×3 IMPLANT
PAD ARMBOARD 7.5X6 YLW CONV (MISCELLANEOUS) ×3 IMPLANT
SET BASIN LINEN APH (SET/KITS/TRAYS/PACK) ×3 IMPLANT
SPONGE GAUZE 2X2 8PLY STER LF (GAUZE/BANDAGES/DRESSINGS) ×1
SPONGE GAUZE 2X2 8PLY STRL LF (GAUZE/BANDAGES/DRESSINGS) ×1 IMPLANT
SUT ETHILON 3 0 FSL (SUTURE) ×3 IMPLANT
SYR CONTROL 10ML LL (SYRINGE) ×3 IMPLANT

## 2018-06-26 NOTE — Op Note (Signed)
06/26/2018  12:21 PM  PATIENT:  [name]  77 y.o. female  PRE-OPERATIVE DIAGNOSIS:  right index trigger finger   POST-OPERATIVE DIAGNOSIS:  right index trigger finger   PROCEDURE:  Procedure(s): RIGHT INDEX FINGER TRIGGER FINGER/A-1 PULLEY RELEASE (Right) [id number]  OPERATIVE REPORT   06/26/2018 12:23 PM Arther Abbott, MD   Preop diagnosis trigger finger right index finger Postop diagnosis same  Procedure release A1 pulley RIGHT INDEX FINGER  Surgeon Aline Brochure Anesthesia Bier block Operation findings: Stenosing tenosynovitis flexor tendon A1 pulley No assistants Counts were correct Clean case no specimen 10 mL of Marcaine with epinephrine injected after the case Patient to recovery room patient's stable condition  The procedure was performed as follows  The patient was identified in the preop area and the surgical site was confirmed and marked, chart update was completed patient taken to surgery given appropriate antibiotics based on her allergy profile  After successful Bier block in sterile prep and drape timeout was completed  A longitudinal incision was made over the A1 pulley of the RIGHT INDEX  finger subcutaneous tissue was divided blunt dissection was carried out to protect neurovascular structures. A blunt instrument was placed underneath the A1 pulley and the A1 pulley was released. Flexion extension of the digit confirmed removal of mechanical block. Wound was irrigated and closed with 3-0 nylon suture  We took the patient to recovery room in stable condition  Arther Abbott, MD   Findings  Stenosis at A1 pulley right index finger   SURGEON:  Surgeon(s) and Role:    * Carole Civil, MD - Primary  PHYSICIAN ASSISTANT:   ASSISTANTS: none   ANESTHESIA:   regional  EBL: none  BLOOD ADMINISTERED:none  DRAINS: none   LOCAL MEDICATIONS USED:  MARCAINE     SPECIMEN:  No Specimen  DISPOSITION OF SPECIMEN:  N/A  COUNTS:   YES  TOURNIQUET:  * Missing tourniquet times found for documented tourniquets in log: [id number] *  DICTATION: .Dragon Dictation  PLAN OF CARE: Discharge to home after PACU  PATIENT DISPOSITION:  PACU - hemodynamically stable.   Delay start of Pharmacological VTE agent (>24hrs) due to surgical blood loss or risk of bleeding: not applicable

## 2018-06-26 NOTE — Anesthesia Postprocedure Evaluation (Signed)
Anesthesia Post Note  Patient: [name]  Procedure(s) Performed: RIGHT INDEX FINGER TRIGGER FINGER/A-1 PULLEY RELEASE (Right )  Patient location during evaluation: PACU Anesthesia Type: Regional Level of consciousness: awake and alert, oriented and patient cooperative Pain management: pain level controlled Vital Signs Assessment: post-procedure vital signs reviewed and stable Respiratory status: spontaneous breathing and respiratory function stable Cardiovascular status: blood pressure returned to baseline and stable Postop Assessment: no headache, no backache, adequate PO intake, no apparent nausea or vomiting and patient able to bend at knees Anesthetic complications: no     Last Vitals:  Vitals:   06/26/18 1143 06/26/18 1230  BP: (!) 148/63   Pulse:    Resp:    Temp:  36.7 C  SpO2:      Last Pain:  Vitals:   06/26/18 1120  TempSrc: Oral  PainSc: 4                  Silver Parkey

## 2018-06-26 NOTE — Brief Op Note (Signed)
06/26/2018  12:21 PM  PATIENT:  [name]  77 y.o. female  PRE-OPERATIVE DIAGNOSIS:  right index trigger finger   POST-OPERATIVE DIAGNOSIS:  right index trigger finger   PROCEDURE:  Procedure(s): RIGHT INDEX FINGER TRIGGER FINGER/A-1 PULLEY RELEASE (Right) 26055  OPERATIVE REPORT   06/26/2018 12:23 PM Arther Abbott, MD   Preop diagnosis trigger finger right index finger Postop diagnosis same  Procedure release A1 pulley RIGHT INDEX FINGER  Surgeon Aline Brochure Anesthesia Bier block Operation findings: Stenosing tenosynovitis flexor tendon A1 pulley No assistants Counts were correct Clean case no specimen 10 mL of Marcaine with epinephrine injected after the case Patient to recovery room patient's stable condition  The procedure was performed as follows  The patient was identified in the preop area and the surgical site was confirmed and marked, chart update was completed patient taken to surgery given appropriate antibiotics based on her allergy profile  After successful Bier block in sterile prep and drape timeout was completed  A longitudinal incision was made over the A1 pulley of the RIGHT INDEX  finger subcutaneous tissue was divided blunt dissection was carried out to protect neurovascular structures. A blunt instrument was placed underneath the A1 pulley and the A1 pulley was released. Flexion extension of the digit confirmed removal of mechanical block. Wound was irrigated and closed with 3-0 nylon suture  We took the patient to recovery room in stable condition  Arther Abbott, MD   Findings  Stenosis at A1 pulley right index finger   SURGEON:  Surgeon(s) and Role:    * Carole Civil, MD - Primary  PHYSICIAN ASSISTANT:   ASSISTANTS: none   ANESTHESIA:   regional  EBL: none  BLOOD ADMINISTERED:none  DRAINS: none   LOCAL MEDICATIONS USED:  MARCAINE     SPECIMEN:  No Specimen  DISPOSITION OF SPECIMEN:  N/A  COUNTS:   YES  TOURNIQUET:  * Missing tourniquet times found for documented tourniquets in log: [id number] *  DICTATION: .Dragon Dictation  PLAN OF CARE: Discharge to home after PACU  PATIENT DISPOSITION:  PACU - hemodynamically stable.   Delay start of Pharmacological VTE agent (>24hrs) due to surgical blood loss or risk of bleeding: not applicable

## 2018-06-26 NOTE — Transfer of Care (Signed)
Immediate Anesthesia Transfer of Care Note  Patient: [name]  Procedure(s) Performed: RIGHT INDEX FINGER TRIGGER FINGER/A-1 PULLEY RELEASE (Right )  Patient Location: PACU  Anesthesia Type:Bier block  Level of Consciousness: awake, alert , oriented and patient cooperative  Airway & Oxygen Therapy: Patient Spontanous Breathing and Patient connected to nasal cannula oxygen  Post-op Assessment: Report given to RN and Post -op Vital signs reviewed and stable  Post vital signs: Reviewed and stable  Last Vitals:  Vitals Value Taken Time  BP 140/58 06/26/2018 12:30 PM  Temp 36.7 C 06/26/2018 12:30 PM  Pulse 50 06/26/2018 12:29 PM  Resp 23 06/26/2018 12:32 PM  SpO2 97 % 06/26/2018 12:29 PM  Vitals shown include unvalidated device data.  Last Pain:  Vitals:   06/26/18 1120  TempSrc: Oral  PainSc: 4       Patients Stated Pain Goal: 5 (09/28/56 4734)  Complications: No apparent anesthesia complications

## 2018-06-26 NOTE — Anesthesia Preprocedure Evaluation (Signed)
Anesthesia Evaluation  Patient identified by MRN, date of birth, ID band Patient awake    Reviewed: Allergy & Precautions, H&P , NPO status , Patient's Chart, lab work & pertinent test results, reviewed documented beta blocker date and time   Airway Mallampati: II  TM Distance: >3 FB Neck ROM: full    Dental no notable dental hx. (+) Edentulous Upper, Edentulous Lower   Pulmonary neg pulmonary ROS, sleep apnea , pneumonia, former smoker,    Pulmonary exam normal breath sounds clear to auscultation       Cardiovascular Exercise Tolerance: Good hypertension, negative cardio ROS  + Valvular Problems/Murmurs  Rhythm:regular Rate:Normal     Neuro/Psych  Headaches, PSYCHIATRIC DISORDERS Anxiety Depression  Neuromuscular disease negative neurological ROS  negative psych ROS   GI/Hepatic negative GI ROS, Neg liver ROS, PUD, GERD  ,  Endo/Other  negative endocrine ROSdiabetesHypothyroidism Morbid obesity  Renal/GU Renal diseasenegative Renal ROS  negative genitourinary   Musculoskeletal   Abdominal   Peds  Hematology negative hematology ROS (+) anemia ,   Anesthesia Other Findings Myasthenia gravis in remission   Reproductive/Obstetrics negative OB ROS                             Anesthesia Physical Anesthesia Plan  ASA: III  Anesthesia Plan: Regional and MAC   Post-op Pain Management:    Induction:   PONV Risk Score and Plan:   Airway Management Planned:   Additional Equipment:   Intra-op Plan:   Post-operative Plan:   Informed Consent: I have reviewed the patients History and Physical, chart, labs and discussed the procedure including the risks, benefits and alternatives for the proposed anesthesia with the patient or authorized representative who has indicated his/her understanding and acceptance.   Dental Advisory Given  Plan Discussed with: CRNA  Anesthesia Plan Comments:          Anesthesia Quick Evaluation

## 2018-06-26 NOTE — Interval H&P Note (Signed)
History and Physical Interval Note:  06/26/2018 11:43 AM  [name]  has presented today for surgery, with the diagnosis of right index trigger finger   The various methods of treatment have been discussed with the patient and family. After consideration of risks, benefits and other options for treatment, the patient has consented to  Procedure(s): RIGHT INDEX FINGER TRIGGER FINGER/A-1 PULLEY RELEASE (Right) as a surgical intervention .  The patient's history has been reviewed, patient examined, no change in status, stable for surgery.  I have reviewed the patient's chart and labs.  Questions were answered to the patient's satisfaction.     Arther Abbott

## 2018-06-27 ENCOUNTER — Encounter (HOSPITAL_COMMUNITY): Payer: Self-pay | Admitting: Orthopedic Surgery

## 2018-07-07 ENCOUNTER — Telehealth: Payer: Self-pay

## 2018-07-07 ENCOUNTER — Ambulatory Visit: Payer: Medicare Other | Admitting: Orthopedic Surgery

## 2018-07-07 NOTE — Telephone Encounter (Signed)
Writer left several messages without success. Writer will place the patient on the inactive list.  Writer routed this information to the PCP and Dr. Modesta Messing.

## 2018-07-09 DIAGNOSIS — Z9889 Other specified postprocedural states: Secondary | ICD-10-CM | POA: Insufficient documentation

## 2018-07-11 ENCOUNTER — Ambulatory Visit (INDEPENDENT_AMBULATORY_CARE_PROVIDER_SITE_OTHER): Payer: Self-pay | Admitting: Orthopedic Surgery

## 2018-07-11 DIAGNOSIS — Z9889 Other specified postprocedural states: Secondary | ICD-10-CM

## 2018-07-11 NOTE — Progress Notes (Signed)
POSTOP VISIT  POD #15  Chief Complaint  Patient presents with  . Follow-up    Recheck on right index finger, DOS 06-26-18.    77 years old right index finger trigger finger release doing well.   Encounter Diagnosis  Name Primary?  . S/P trigger finger release 06/26/18 right index     Wound looks good sutures were removed no clicking or popping  Postoperative plan (Work, WB, No orders of the defined types were placed in this encounter. ,FU)  Cover with Band-Aid 4-week follow-up as needed

## 2018-07-17 ENCOUNTER — Other Ambulatory Visit: Payer: Self-pay

## 2018-07-17 ENCOUNTER — Ambulatory Visit (INDEPENDENT_AMBULATORY_CARE_PROVIDER_SITE_OTHER): Payer: Medicare Other | Admitting: Family Medicine

## 2018-07-17 VITALS — BP 144/60 | HR 59 | Ht 66.0 in | Wt 246.1 lb

## 2018-07-17 DIAGNOSIS — E559 Vitamin D deficiency, unspecified: Secondary | ICD-10-CM

## 2018-07-17 DIAGNOSIS — F418 Other specified anxiety disorders: Secondary | ICD-10-CM | POA: Diagnosis not present

## 2018-07-17 DIAGNOSIS — G8929 Other chronic pain: Secondary | ICD-10-CM

## 2018-07-17 DIAGNOSIS — E119 Type 2 diabetes mellitus without complications: Secondary | ICD-10-CM | POA: Diagnosis not present

## 2018-07-17 DIAGNOSIS — Z794 Long term (current) use of insulin: Secondary | ICD-10-CM | POA: Diagnosis not present

## 2018-07-17 DIAGNOSIS — D508 Other iron deficiency anemias: Secondary | ICD-10-CM | POA: Diagnosis not present

## 2018-07-17 DIAGNOSIS — E785 Hyperlipidemia, unspecified: Secondary | ICD-10-CM | POA: Diagnosis not present

## 2018-07-17 DIAGNOSIS — D631 Anemia in chronic kidney disease: Secondary | ICD-10-CM

## 2018-07-17 DIAGNOSIS — N189 Chronic kidney disease, unspecified: Secondary | ICD-10-CM | POA: Diagnosis not present

## 2018-07-17 DIAGNOSIS — IMO0001 Reserved for inherently not codable concepts without codable children: Secondary | ICD-10-CM

## 2018-07-17 DIAGNOSIS — D472 Monoclonal gammopathy: Secondary | ICD-10-CM | POA: Diagnosis not present

## 2018-07-17 DIAGNOSIS — M170 Bilateral primary osteoarthritis of knee: Secondary | ICD-10-CM | POA: Diagnosis not present

## 2018-07-17 DIAGNOSIS — I10 Essential (primary) hypertension: Secondary | ICD-10-CM

## 2018-07-17 MED ORDER — HYDROCODONE-ACETAMINOPHEN 10-325 MG PO TABS: ORAL_TABLET | ORAL | 0 refills | Status: AC

## 2018-07-17 NOTE — Progress Notes (Signed)
[name]     MRN: [id number]      DOB: 12/16/1941   HPI [name] is here for follow up and re-evaluation of chronic medical conditions, medication management and review of any available recent lab and radiology data.  Preventive health is updated, specifically  Cancer screening and Immunization.   Recent right hand surgery successful trigger finger release on 06/26/2018 the [name] has had in the interim are  addressed. The [name] denies any adverse reactions to current medications since the last visit.  Requests walker with seat and diabetic shoes ROS Denies recent fever or chills. C/o fatigue Denies sinus pressure, nasal congestion, ear pain or sore throat. Denies chest congestion, productive cough or wheezing. Denies chest pains, palpitations and leg swelling Denies abdominal pain, nausea, vomiting,diarrhea or constipation.   Denies dysuria, frequency, hesitancy has severe chronic  incontinence.  Denies headaches, seizures, numbness, or tingling. Denies uncontrolled depression, anxiety or insomnia. Denies skin break down or rash.   PE  BP (!) 144/60   Pulse (!) 59   Ht 5\' 6"  (1.676 m)   Wt 246 lb 1.3 oz (111.6 kg)   SpO2 98%   BMI 39.72 kg/m   Patient alert and oriented and in no cardiopulmonary distress.  HEENT: No facial asymmetry, EOMI,   oropharynx pink and moist.  Neck decreased though adequate ROM no JVD, no mass.  Chest: Clear to auscultation bilaterally.  CVS: S1, S2 no murmurs, no S3.Regular rate.  ABD: Soft non tender.   Ext: No edema  MS: decreased ROM spine hips and knees  Skin: Intact, no ulcerations or rash noted.  Psych: Good eye contact, normal affect. Memory intact not anxious or depressed appearing.  CNS: CN 2-12 intact, power,  normal throughout.no focal deficits noted.   Assessment & Plan  Essential hypertension, benign Adequate control, no change in medication DASH diet and commitment to daily physical activity for a minimum of 30  minutes discussed and encouraged, as a part of hypertension management. The importance of attaining a healthy weight is also discussed.  BP/Weight 07/17/2018 07/11/2018 06/26/2018 06/24/2018 06/20/2018 06/15/736 1/0/6269  Systolic BP 485 462 703 500 938 182 993  Diastolic BP 60 75 81 66 60 78 64  Wt. (Lbs) 246.08 233 243 243 242 243 240  BMI 39.72 37.61 39.22 39.22 39.06 39.22 38.74       Morbid obesity Deteriorated. Patient re-educated about  the importance of commitment to a  minimum of 150 minutes of exercise per week.  The importance of healthy food choices with portion control discussed. Encouraged to start a food diary, count calories and to consider  joining a support group. Sample diet sheets offered. Goals set by the patient for the next several months.   Weight /BMI 07/17/2018 07/11/2018 06/26/2018  WEIGHT 246 lb 1.3 oz 233 lb 243 lb  HEIGHT 5\' 6"  5\' 6"  5\' 6"   BMI 39.72 kg/m2 37.61 kg/m2 39.22 kg/m2      Anemia in chronic renal disease Needs to re establish with Specialty clinic for treatment, agrees that this is the case as she has fatigue and Hb has fallen, though cost has increased , will refer  Monoclonal gammopathy of unknown significance (MGUS) Follow up with heme / onc past due was to have returned since 01/2018 will refer  Iron deficiency anemia Has been followed  At heme/onc clinic and needs to return, fall in Hb noted and [name] c/o fatigue  Depression with anxiety Improved , continue current medication, has  benefited from tele psych also  Diabetes mellitus, insulin dependent (IDDM), controlled [facility]) [name] is reminded of the importance of commitment to daily physical activity for 30 minutes or more, as able and the need to limit carbohydrate intake to 30 to 60 grams per meal to help with blood sugar control.   The need to take medication as prescribed, test blood sugar as directed, and to call between visits if there is a concern that blood sugar is  uncontrolled is also discussed.   [name] is reminded of the importance of daily foot exam, annual eye examination, and good blood sugar, blood pressure and cholesterol control.  Diabetic Labs Latest Ref Rng & Units 06/24/2018 03/31/2018 11/26/2017 09/13/2017 05/07/2017  HbA1c 4.8 - 5.6 % 6.2(H) 6.3(H) - 6.5(H) -  Microalbumin mg/dL - 17.1 - - -  Micro/Creat Ratio <30 mcg/mg creat - 118(H) - - -  Chol <200 mg/dL - 191 - 160 -  HDL >50 mg/dL - 50(L) - 47(L) -  Calc LDL mg/dL (calc) - 124(H) - 92 -  Triglycerides <150 mg/dL - 71 - 117 -  Creatinine 0.44 - 1.00 mg/dL 1.04(H) 0.97(H) 0.99 0.87 1.12(H)   BP/Weight 07/17/2018 07/11/2018 06/26/2018 06/24/2018 06/20/2018 08/24/36 0/03/8888  Systolic BP 169 450 388 828 003 491 791  Diastolic BP 60 75 81 66 60 78 64  Wt. (Lbs) 246.08 233 243 243 242 243 240  BMI 39.72 37.61 39.22 39.22 39.06 39.22 38.74   Foot/eye exam completion dates Latest Ref Rng & Units 07/17/2018 04/14/2018  Eye Exam No Retinopathy - Retinopathy(A)  Foot Form Completion - Done -   Over corrected , medication dose reduced, [name] reports forcing herself to eat to keep p her blood sugar and she is obese     Encounter for chronic pain management The patient's Controlled Substance registry is reviewed and compliance confirmed. Adequacy of  Pain control and level of function is assessed. Medication dosing is adjusted as deemed appropriate. Twelve weeks of medication is prescribed , patient signs for the script and is provided with a follow up appointment between 11 to 12 weeks .

## 2018-07-17 NOTE — Patient Instructions (Addendum)
F/U in 1/U in 13 weeks , call if you need me before  REDUCE insulin to 25 units, if sugars are still low then reduce to 20 units daily  I am referring you to hematology for anemia   I am referring you to Podiatry  Who accepts your insurance  You do qualify for diabetic shoes and walker with seat, prescriptions are sent to [facility] today u to posdiatry for foot care in [place] if they take your ins  Fasting lipid, cmp and EGFR, HBA1C and vit D 5 to 7 days before next visit  Careful not to fall!

## 2018-07-18 ENCOUNTER — Other Ambulatory Visit: Payer: Self-pay | Admitting: Family Medicine

## 2018-07-18 DIAGNOSIS — R2681 Unsteadiness on feet: Secondary | ICD-10-CM | POA: Diagnosis not present

## 2018-07-18 DIAGNOSIS — E119 Type 2 diabetes mellitus without complications: Secondary | ICD-10-CM | POA: Diagnosis not present

## 2018-07-18 DIAGNOSIS — M179 Osteoarthritis of knee, unspecified: Secondary | ICD-10-CM | POA: Diagnosis not present

## 2018-07-18 DIAGNOSIS — M2142 Flat foot [pes planus] (acquired), left foot: Secondary | ICD-10-CM | POA: Diagnosis not present

## 2018-07-18 DIAGNOSIS — M2141 Flat foot [pes planus] (acquired), right foot: Secondary | ICD-10-CM | POA: Diagnosis not present

## 2018-07-20 ENCOUNTER — Encounter: Payer: Self-pay | Admitting: Family Medicine

## 2018-07-20 NOTE — Assessment & Plan Note (Signed)
The patient's Controlled Substance registry is reviewed and compliance confirmed. Adequacy of  Pain control and level of function is assessed. Medication dosing is adjusted as deemed appropriate. Twelve weeks of medication is prescribed , patient signs for the script and is provided with a follow up appointment between 11 to 12 weeks .  

## 2018-07-20 NOTE — Assessment & Plan Note (Signed)
Follow up with heme / onc past due was to have returned since 01/2018 will refer

## 2018-07-20 NOTE — Assessment & Plan Note (Signed)
Needs to re establish with [facility] for treatment, agrees that this is the case as she has fatigue and Hb has fallen, though cost has increased , will refer

## 2018-07-20 NOTE — Assessment & Plan Note (Signed)
Improved , continue current medication, has benefited from tele psych also

## 2018-07-20 NOTE — Assessment & Plan Note (Signed)
Adequate control, no change in medication DASH diet and commitment to daily physical activity for a minimum of 30 minutes discussed and encouraged, as a part of hypertension management. The importance of attaining a healthy weight is also discussed.  BP/Weight 07/17/2018 07/11/2018 06/26/2018 06/24/2018 06/20/2018 2/54/9826 03/31/5829  Systolic BP 940 768 088 110 315 945 859  Diastolic BP 60 75 81 66 60 78 64  Wt. (Lbs) 246.08 233 243 243 242 243 240  BMI 39.72 37.61 39.22 39.22 39.06 39.22 38.74

## 2018-07-20 NOTE — Assessment & Plan Note (Signed)
Deteriorated. Patient re-educated about  the importance of commitment to a  minimum of 150 minutes of exercise per week.  The importance of healthy food choices with portion control discussed. Encouraged to start a food diary, count calories and to consider  joining a support group. Sample diet sheets offered. Goals set by the patient for the next several months.   Weight /BMI 07/17/2018 07/11/2018 06/26/2018  WEIGHT 246 lb 1.3 oz 233 lb 243 lb  HEIGHT 5\' 6"  5\' 6"  5\' 6"   BMI 39.72 kg/m2 37.61 kg/m2 39.22 kg/m2

## 2018-07-20 NOTE — Assessment & Plan Note (Addendum)
Has been followed  At heme/onc clinic and needs to return, fall in Hb noted and pt c/o fatigue

## 2018-07-20 NOTE — Assessment & Plan Note (Signed)
[name] is reminded of the importance of commitment to daily physical activity for 30 minutes or more, as able and the need to limit carbohydrate intake to 30 to 60 grams per meal to help with blood sugar control.   The need to take medication as prescribed, test blood sugar as directed, and to call between visits if there is a concern that blood sugar is uncontrolled is also discussed.   [name] is reminded of the importance of daily foot exam, annual eye examination, and good blood sugar, blood pressure and cholesterol control.  Diabetic Labs Latest Ref Rng & Units 06/24/2018 03/31/2018 11/26/2017 09/13/2017 05/07/2017  HbA1c 4.8 - 5.6 % 6.2(H) 6.3(H) - 6.5(H) -  Microalbumin mg/dL - 17.1 - - -  Micro/Creat Ratio <30 mcg/mg creat - 118(H) - - -  Chol <200 mg/dL - 191 - 160 -  HDL >50 mg/dL - 50(L) - 47(L) -  Calc LDL mg/dL (calc) - 124(H) - 92 -  Triglycerides <150 mg/dL - 71 - 117 -  Creatinine 0.44 - 1.00 mg/dL 1.04(H) 0.97(H) 0.99 0.87 1.12(H)   BP/Weight 07/17/2018 07/11/2018 06/26/2018 06/24/2018 06/20/2018 03/29/5187 03/31/6605  Systolic BP 301 601 093 235 573 220 254  Diastolic BP 60 75 81 66 60 78 64  Wt. (Lbs) 246.08 233 243 243 242 243 240  BMI 39.72 37.61 39.22 39.22 39.06 39.22 38.74   Foot/eye exam completion dates Latest Ref Rng & Units 07/17/2018 04/14/2018  Eye Exam No Retinopathy - Retinopathy(A)  Foot Form Completion - Done -   Over corrected , medication dose reduced, pt reports forcing herself to eat to keep p her blood sugar and she is obese

## 2018-07-21 ENCOUNTER — Other Ambulatory Visit: Payer: Self-pay

## 2018-07-21 ENCOUNTER — Other Ambulatory Visit (HOSPITAL_COMMUNITY): Payer: Self-pay | Admitting: *Deleted

## 2018-07-21 DIAGNOSIS — D508 Other iron deficiency anemias: Secondary | ICD-10-CM

## 2018-07-21 MED ORDER — UNABLE TO FIND: 0 refills | Status: DC

## 2018-07-21 MED ORDER — PYRIDOSTIGMINE BROMIDE 60 MG PO TABS: ORAL_TABLET | ORAL | 0 refills | Status: DC

## 2018-07-22 ENCOUNTER — Inpatient Hospital Stay (HOSPITAL_COMMUNITY): Payer: Medicare Other | Attending: Internal Medicine

## 2018-07-22 DIAGNOSIS — D472 Monoclonal gammopathy: Secondary | ICD-10-CM | POA: Insufficient documentation

## 2018-07-22 DIAGNOSIS — D509 Iron deficiency anemia, unspecified: Secondary | ICD-10-CM | POA: Insufficient documentation

## 2018-07-22 DIAGNOSIS — D631 Anemia in chronic kidney disease: Secondary | ICD-10-CM

## 2018-07-22 DIAGNOSIS — N189 Chronic kidney disease, unspecified: Secondary | ICD-10-CM

## 2018-07-22 DIAGNOSIS — D508 Other iron deficiency anemias: Secondary | ICD-10-CM

## 2018-07-22 LAB — COMPREHENSIVE METABOLIC PANEL
ALK PHOS: 149 U/L — AB (ref 38–126)
ALT: 14 U/L (ref 0–44)
AST: 13 U/L — AB (ref 15–41)
Albumin: 3.3 g/dL — ABNORMAL LOW (ref 3.5–5.0)
Anion gap: 7 (ref 5–15)
BUN: 23 mg/dL (ref 8–23)
CALCIUM: 8.3 mg/dL — AB (ref 8.9–10.3)
CHLORIDE: 108 mmol/L (ref 98–111)
CO2: 27 mmol/L (ref 22–32)
CREATININE: 1.1 mg/dL — AB (ref 0.44–1.00)
GFR calc non Af Amer: 48 mL/min — ABNORMAL LOW (ref >60)
GFR, EST AFRICAN AMERICAN: 55 mL/min — AB (ref >60)
GLUCOSE: 77 mg/dL (ref 70–99)
Potassium: 4.1 mmol/L (ref 3.5–5.1)
SODIUM: 142 mmol/L (ref 135–145)
Total Bilirubin: 0.5 mg/dL (ref 0.3–1.2)
Total Protein: 6.7 g/dL (ref 6.5–8.1)

## 2018-07-22 LAB — CBC WITH DIFFERENTIAL/PLATELET
BASOS ABS: 0 10*3/uL (ref 0.0–0.1)
Basophils Relative: 0 %
EOS ABS: 0.2 10*3/uL (ref 0.0–0.7)
Eosinophils Relative: 2 %
HCT: 26.6 % — ABNORMAL LOW (ref 36.0–46.0)
HEMOGLOBIN: 8.2 g/dL — AB (ref 12.0–15.0)
LYMPHS ABS: 1.7 10*3/uL (ref 0.7–4.0)
LYMPHS PCT: 16 %
MCH: 27.2 pg (ref 26.0–34.0)
MCHC: 30.8 g/dL (ref 30.0–36.0)
MCV: 88.1 fL (ref 78.0–100.0)
Monocytes Absolute: 0.9 10*3/uL (ref 0.1–1.0)
Monocytes Relative: 9 %
NEUTROS PCT: 73 %
Neutro Abs: 7.5 10*3/uL (ref 1.7–7.7)
PLATELETS: 268 10*3/uL (ref 150–400)
RBC: 3.02 MIL/uL — AB (ref 3.87–5.11)
RDW: 18.5 % — ABNORMAL HIGH (ref 11.5–15.5)
WBC: 10.3 10*3/uL (ref 4.0–10.5)

## 2018-07-22 LAB — IRON AND TIBC
Iron: 29 ug/dL (ref 28–170)
Saturation Ratios: 8 % — ABNORMAL LOW (ref 10.4–31.8)
TIBC: 372 ug/dL (ref 250–450)
UIBC: 343 ug/dL

## 2018-07-22 LAB — FERRITIN: Ferritin: 9 ng/mL — ABNORMAL LOW (ref 11–307)

## 2018-07-23 ENCOUNTER — Other Ambulatory Visit: Payer: Self-pay | Admitting: Family Medicine

## 2018-07-23 ENCOUNTER — Encounter (HOSPITAL_COMMUNITY): Payer: Self-pay | Admitting: Internal Medicine

## 2018-07-23 ENCOUNTER — Inpatient Hospital Stay (HOSPITAL_BASED_OUTPATIENT_CLINIC_OR_DEPARTMENT_OTHER): Payer: Medicare Other | Admitting: Internal Medicine

## 2018-07-23 ENCOUNTER — Other Ambulatory Visit: Payer: Self-pay

## 2018-07-23 VITALS — BP 158/49 | HR 63 | Temp 98.2°F | Resp 18 | Wt 248.1 lb

## 2018-07-23 DIAGNOSIS — D472 Monoclonal gammopathy: Secondary | ICD-10-CM | POA: Diagnosis not present

## 2018-07-23 DIAGNOSIS — D509 Iron deficiency anemia, unspecified: Secondary | ICD-10-CM | POA: Diagnosis not present

## 2018-07-23 DIAGNOSIS — D508 Other iron deficiency anemias: Secondary | ICD-10-CM

## 2018-07-23 LAB — BETA 2 MICROGLOBULIN, SERUM: Beta-2 Microglobulin: 3 mg/L — ABNORMAL HIGH (ref 0.6–2.4)

## 2018-07-23 NOTE — Progress Notes (Signed)
Diagnosis Other iron deficiency anemia - Plan: CBC with Differential/Platelet, Comprehensive metabolic panel, Lactate dehydrogenase, Ferritin, Protein electrophoresis, serum, IgG, IgA, IgM, Kappa/lambda light chains, Beta 2 microglobulin, serum  MGUS (monoclonal gammopathy of unknown significance) - Plan: CBC with Differential/Platelet, Comprehensive metabolic panel, Lactate dehydrogenase, Ferritin, Protein electrophoresis, serum, IgG, IgA, IgM, Kappa/lambda light chains, Beta 2 microglobulin, serum  Staging Cancer Staging No matching staging information was found for the patient.  Assessment and Plan:  1. IDA (ron deficiency anemia.  Labs done 07/22/2018 reviewed with pt and shows WBC 10.3 HB 8.2 plts 268,000.  Ferritin is 9 which is consistent with IDA.  Cr 1.10.  Pt has an intolerance to oral iron.  She was last treated with IV iron in 2017. She will be given a trial of Injectafer 750 mg IV D1 and D8.  She will RTC 6-8 weeks after IV iron for repeat labs and follow-up.  Side effects of the medication were reviewed with the pt.  Pt is referred to GI for evaluation due to anemia.    2.  Fatigue.  Likely due to IDA.  Will determine if symptoms improve after IV iron.    3. IgA kappa monoclonal gammopathy.  Previousl SPEPs have been negative for a monoclonal protein.  Immunofixation demonstrated an IgA kappa monoclonal gammopathy. She was given option of bone marrow biopsy by Dr. Talbert Cage in the past but did not desire to have procedure done.  Will repeat myeloma labs on RTC.    4.  HTN.  BP is 158/49.  Follow-up with PCP.    5.  DM.  Follow-up with PCP for monitoring.    6  Health maintenance.  Pt is referred to GI for evaluation due to IDA.    Current Status:  Pt is seen today for follow-up.  She is here to go over labs.  She is complaining of fatigue.    Problem List Patient Active Problem List   Diagnosis Date Noted  . S/P trigger finger release 06/26/18 [Z98.890] 07/09/2018  . Trigger  finger, right index finger [M65.321]   . 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 Memory loss of unknown cause [R41.3] 11/10/2017  . Chronic left shoulder pain [M25.512, G89.29] 09/14/2017  . Encounter for chronic pain management [G89.29] 02/09/2017  . Carpopedal spasm [R29.0] 01/18/2017  . 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 Morbid obesity ([place]) [E66.01] 04/18/2015  . Myasthenia gravis in remission ([place]) [G70.00] 11/24/2014  . Myasthenia gravis ([place]) [G70.00] 11/16/2014  . Vitamin D deficiency [E55.9] 05/24/2014  . Osteopenia [M85.80] 04/26/2014  . 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 Esophageal dysphagia [R13.10] 04/03/2012  . Abnormal chest CT [R93.89] 03/02/2012  . Insomnia [G47.00] 01/02/2012  . Essential hypertension, benign [I10] 05/30/2011  . Schatzki's ring [K22.2] 05/07/2011  . INGROWN TOENAIL [L60.0] 01/10/2011  . GLAUCOMA [H40.9] 08/08/2010  . DYSCHROMIA, UNSPECIFIED [L81.9] 08/08/2010  . Chronic renal disease, stage 3, moderately decreased glomerular filtration rate (GFR) between 30-59 mL/min/1.73 square meter ([place]) [N18.3] 09/14/2009  . HIP PAIN, LEFT [M25.559] 06/09/2009  . Urinary incontinence [R32] 06/09/2009  . Iron deficiency anemia [D50.9] 10/02/2008  . Hypothyroidism [E03.9] 09/27/2008  . Hyperlipemia [E78.5] 06/30/2008  . Depression with anxiety [F41.8] 06/30/2008  . GERD [K21.9] 06/30/2008  . Obstructive sleep apnea [G47.33] 06/30/2008  . CARPAL TUNNEL SYNDROME [G56.00] 05/19/2008  . SHOULDER  PAIN [M25.519] 02/02/2008  . IMPINGEMENT SYNDROME [M75.80] 02/02/2008  . 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 Diabetes mellitus, insulin dependent (IDDM), controlled ([place]) [E11.9, Z79.4] 09/09/2007    Past Medical History Past Medical  History:  Diagnosis Date  . Anemia in chronic renal disease 12/30/2015  . Anxiety   . Arthritis    "all over" (01/19/2014)  . Carpal tunnel syndrome   . Chronic bronchitis ([place])    "got it q yr for awhile; hasn't had it in awhile" (01/19/2014)  . Chronic kidney disease (CKD) stage G3b/A1, moderately decreased glomerular filtration rate (GFR) between 30-44 mL/min/1.73 square meter and albuminuria creatinine ratio less than 30 mg/g (HCC) 09/14/2009   Qualifier: Diagnosis of  By: Moshe Cipro MD, Joycelyn Schmid    . Chronic renal disease, stage 3, moderately decreased glomerular filtration rate (GFR) between 30-59 mL/min/1.73 square meter (HCC) 09/14/2009   Qualifier: Diagnosis of  By: Moshe Cipro MD, Joycelyn Schmid    . Complication of anesthesia    combative  . Depression   . Diverticulosis 07/2004   Colonscopy Dr Gala Romney  . Esophagitis, erosive 2009  . Exertional shortness of breath   . Gastroesophageal reflux   . TIRWERXV(400.8)    "usually a couple times/wk" (01/19/2014)  . Heart murmur    saw cardiology In [place], he told her she did not need to come back.  . Hyperlipidemia   . Hypertension   . Impingement syndrome, shoulder   . Iron deficiency anemia   . Mitral regurgitation   . Myasthenia gravis   . Myasthenia gravis in remission ([place]) 11/24/2014  . Obesity   . OSA on CPAP   . Pneumonia 01/2012  . Pulmonary HTN ([place])   . Schatzki's ring    Last EGD w/ dilation 02/08/11, 2009 & 2007  . Seasonal allergies   . Shoulder pain   . Thyroid disease    "used to take RX; they took me off it" (01/19/2014)  . Tobacco abuse   . Type II diabetes mellitus ([place])     Past Surgical History Past Surgical History:  Procedure Laterality Date  . [place] VITRECTOMY WITH 20 GAUGE MVR PORT Left 01/19/2014   Procedure: 25 GAUGE PARS PLANA VITRECTOMY WITH 20 GAUGE MVR PORT; MEMBRAME PEEL; SERUM PATCH; LASER TREATMENT; C3F8;  Surgeon: Hayden Pedro, MD;  Location: [facility];  Service: Ophthalmology;   Laterality: Left;  . ABDOMINAL HYSTERECTOMY    . APPENDECTOMY    . CARPAL TUNNEL RELEASE Bilateral   . CATARACT EXTRACTION W/PHACO Left 10/21/2013   Procedure: LEFT CATARACT EXTRACTION PHACO AND INTRAOCULAR LENS PLACEMENT (IOC);  Surgeon: Marylynn Pearson, MD;  Location: [facility];  Service: Ophthalmology;  Laterality: Left;  . CHOLECYSTECTOMY    . COLONOSCOPY  05/08/2012   QPY:[id number] and external hemorrhoids; colonic diverticulosis  . ESOPHAGEAL DILATION     "more than 3 times" (01/19/2014)  . ESOPHAGOGASTRODUODENOSCOPY  02/08/11   [place]-Distal esophageal erosion consistent with mild erosive reflux   esophagitis/ Noncritical Schatzki ring, small hiatal hernia otherwise upper/ gastrointestinal tract appeared unremarkable, status post passage  of a Maloney dilation to biopsy disruption of the ring described  . ESOPHAGOGASTRODUODENOSCOPY  04/2012   2 tandem incomplete distal esophagea rings s/p dilation.   . ESOPHAGOGASTRODUODENOSCOPY N/A 09/16/2014   Procedure: ESOPHAGOGASTRODUODENOSCOPY (EGD);  Surgeon: Daneil Dolin, MD;  [facility];  Service: Endoscopy;  Laterality: N/A;  1200  . EYE SURGERY    . INCONTINENCE SURGERY  08/26/09   [place]  .  JOINT REPLACEMENT     right total knee  . MALONEY DILATION N/A 09/16/2014   Procedure: Venia Minks DILATION;  Surgeon: Daneil Dolin, MD;  [facility];  Service: Endoscopy;  Laterality: N/A;  . PARS PLANA VITRECTOMY W/ REPAIR OF MACULAR HOLE Left 01/19/2014  . SAVORY DILATION N/A 09/16/2014   Procedure: SAVORY DILATION;  Surgeon: Daneil Dolin, MD;  [facility];  Service: Endoscopy;  Laterality: N/A;  . TONSILLECTOMY    . TOTAL KNEE ARTHROPLASTY Right 05/13/07   Dr. Aline Brochure  . TRIGGER FINGER RELEASE Right 06/26/2018   Procedure: RIGHT INDEX FINGER TRIGGER FINGER/A-1 PULLEY RELEASE;  Surgeon: Carole Civil, MD;  Location: [facility];  Service: Orthopedics;  Laterality: Right;    Family History Family History  Problem  Relation Age of Onset  . Cancer Mother   . Heart disease Mother   . Cancer Father   . Heart disease Father   . Diabetes Sister   . Hypertension Brother   . Heart disease Brother   . Cancer Sister   . Kidney failure Sister   . Diabetes Son   . Hypertension Son   . Hypertension Daughter   . Colon cancer Neg Hx      Social History  reports that she quit smoking about 14 years ago. Her smoking use included cigarettes. She has a 12.50 pack-year smoking history. She has never used smokeless tobacco. She reports that she does not drink alcohol or use drugs.  Medications  Current Outpatient Medications:  .  amLODipine (NORVASC) 10 MG tablet, TAKE 1 TABLET BY MOUTH ONCE A DAY., Disp: 30 tablet, Rfl: 5 .  B-D ULTRAFINE III SHORT PEN 31G X 8 MM MISC, USE ONCE DAILY WITH LANTUS SOLOSTAR PEN., Disp: 100 each, Rfl: 0 .  brimonidine-timolol (COMBIGAN) 0.2-0.5 % ophthalmic solution, Place 1 drop into both eyes every 12 (twelve) hours., Disp: , Rfl:  .  cetirizine (ZYRTEC ALLERGY) 10 MG tablet, Take 1 tablet (10 mg total) by mouth daily. (Patient taking differently: Take 10 mg by mouth at bedtime. ), Disp: 30 tablet, Rfl: 5 .  cetirizine (ZYRTEC) 10 MG tablet, TAKE 1 TABLET BY MOUTH ONCE A DAY., Disp: 30 tablet, Rfl: 5 .  Cholecalciferol (VITAMIN D3) 2000 units TABS, Take 2,000 Units by mouth at bedtime., Disp: , Rfl:  .  citalopram (CELEXA) 20 MG tablet, TAKE 1 TABLET BY MOUTH ONCE A DAY., Disp: 30 tablet, Rfl: 5 .  Cranberry 1000 MG CAPS, Take 1,000 mg by mouth daily. , Disp: , Rfl:  .  cycloSPORINE (RESTASIS) 0.05 % ophthalmic emulsion, Place 1 drop into both eyes 2 (two) times daily as needed (use scheduled once daily, may repeat if needed for dry eyes.). , Disp: , Rfl:  .  diltiazem (CARDIZEM CD) 240 MG 24 hr capsule, TAKE (1) CAPSULE BY MOUTH TWICE DAILY., Disp: 60 capsule, Rfl: 5 .  docusate sodium (COLACE) 100 MG capsule, Take 100 mg by mouth at bedtime., Disp: , Rfl:  .  ferrous sulfate 325  (65 FE) MG tablet, Take 325 mg by mouth at bedtime., Disp: , Rfl:  .  Flaxseed, Linseed, (FLAXSEED OIL) 1000 MG CAPS, Take 1,000 mg by mouth daily., Disp: , Rfl:  .  fluticasone (FLONASE) 50 MCG/ACT nasal spray, Place 2 sprays into both nostrils daily. (Patient taking differently: Place 2 sprays into both nostrils daily as needed for allergies. ), Disp: 48 g, Rfl: 1 .  gabapentin (NEURONTIN) 300 MG capsule, TAKE (1) CAPSULE  BY MOUTH AT BEDTIME., Disp: 30 capsule, Rfl: 0 .  hydrALAZINE (APRESOLINE) 25 MG tablet, TAKE 1 TABLET BY MOUTH THREE TIMES A DAY., Disp: 90 tablet, Rfl: 3 .  hydrALAZINE (APRESOLINE) 25 MG tablet, TAKE 1 TABLET BY MOUTH THREE TIMES A DAY., Disp: 90 tablet, Rfl: 5 .  [START ON 07/30/2018] HYDROcodone-acetaminophen (NORCO) 10-325 MG tablet, One tablet two times daily for back pain, Disp: 60 tablet, Rfl: 0 .  [START ON 08/29/2018] HYDROcodone-acetaminophen (NORCO) 10-325 MG tablet, One tablet two times daily for back pain, Disp: 60 tablet, Rfl: 0 .  [START ON 09/28/2018] HYDROcodone-acetaminophen (NORCO) 10-325 MG tablet, One tablet two times daily for back pain, Disp: 60 tablet, Rfl: 0 .  LANTUS SOLOSTAR 100 UNIT/ML Solostar Pen, INJECT 30 UNITS SUBCUTANEOUSLY ONCE DAILY., Disp: 15 mL, Rfl: 0 .  losartan (COZAAR) 100 MG tablet, TAKE (1) TABLET BY MOUTH DAILY., Disp: 30 tablet, Rfl: 5 .  Multiple Vitamin (MULTIVITAMIN WITH MINERALS) TABS tablet, Take 1 tablet by mouth at bedtime., Disp: , Rfl:  .  ondansetron (ZOFRAN) 4 MG tablet, TAKE (1) TABLET BY MOUTH EVERY FOUR HOURS AS NEEDED FOR NAUSEA., Disp: 30 tablet, Rfl: 0 .  ONETOUCH VERIO test strip, USE AS DIRECTED TO TEST BLOOD GLUCOSE 3 TIMES DAILY., Disp: 100 each, Rfl: 11 .  PAIN RELIEF EXTRA STRENGTH 500 MG tablet, TAKE (1) TABLET BY MOUTH AT BEDTIME., Disp: 30 tablet, Rfl: 0 .  pantoprazole (PROTONIX) 40 MG tablet, TAKE 1 TABLET BY MOUTH DAILY BEFORE BREAKFAST., Disp: 30 tablet, Rfl: 5 .  polyethylene glycol powder  (GLYCOLAX/MIRALAX) powder, MIX 17G (1 CAPFUL) IN 8 OUNCES OF JUICE OR WATER AND DRINK ONCE DAILY. (Patient taking differently: MIX 17G (1 CAPFUL) IN 8 OUNCES OF JUICE OR WATER AND DRINK ONCE DAILY AS NEEDED FOR CONSTIPATION.), Disp: 527 g, Rfl: 3 .  predniSONE (DELTASONE) 5 MG tablet, Take 1 tablet (5 mg total) by mouth daily., Disp: 90 tablet, Rfl: 1 .  PROAIR HFA 108 (90 Base) MCG/ACT inhaler, INHALE 2 PUFFS BY MOUTH EVERY 6 HOURS AS NEEDED ., Disp: 8.5 g, Rfl: 5 .  pyridostigmine (MESTINON) 60 MG tablet, TAKE (1) TABLET BY MOUTH THREE TIMES A DAY., Disp: 90 tablet, Rfl: 0 .  rosuvastatin (CRESTOR) 40 MG tablet, TAKE (1) TABLET BY MOUTH AT BEDTIME., Disp: 30 tablet, Rfl: 5 .  spironolactone (ALDACTONE) 25 MG tablet, TAKE 1 TABLET BY MOUTH ONCE A DAY., Disp: 30 tablet, Rfl: 0 .  TRADJENTA 5 MG TABS tablet, TAKE 1 TABLET BY MOUTH ONCE A DAY., Disp: 30 tablet, Rfl: 0 .  UNABLE TO FIND, Diabetic shoes x 1 Inserts x 3 DX E11.9, Disp: 1 each, Rfl: 0 .  venlafaxine XR (EFFEXOR-XR) 150 MG 24 hr capsule, TAKE 1 CAPSULE BY MOUTH DAILY., Disp: 30 capsule, Rfl: 0 .  VESICARE 10 MG tablet, TAKE 1 TABLET BY MOUTH ONCE A DAY. (Patient taking differently: TAKE 1 TABLET BY MOUTH ONCE A DAY BEDTIME), Disp: 30 tablet, Rfl: 0  Allergies Patient has no known allergies.  Review of Systems Review of Systems - Oncology ROS negative other than fatigue.     Physical Exam  Vitals Wt Readings from Last 3 Encounters:  07/23/18 248 lb 1.6 oz (112.5 kg)  07/17/18 246 lb 1.3 oz (111.6 kg)  07/11/18 233 lb (105.7 kg)   Temp Readings from Last 3 Encounters:  07/23/18 98.2 F (36.8 C) (Oral)  06/26/18 97.9 F (36.6 C) (Oral)  06/24/18 98.1 F (36.7 C) (Oral)   BP Readings  from Last 3 Encounters:  07/23/18 (!) 158/49  07/17/18 (!) 144/60  07/11/18 (!) 170/75   Pulse Readings from Last 3 Encounters:  07/23/18 63  07/17/18 (!) 59  07/11/18 (!) 57   Constitutional: Well-developed, well-nourished, and in no  distress.   HENT: Head: Normocephalic and atraumatic.  Mouth/Throat: No oropharyngeal exudate. Mucosa moist. Eyes: Pupils are equal, round, and reactive to light. Conjunctivae are normal. No scleral icterus.  Neck: Normal range of motion. Neck supple. No JVD present.  Cardiovascular: Normal rate, regular rhythm and normal heart sounds.  Exam reveals no gallop and no friction rub.   No murmur heard. Pulmonary/Chest: Effort normal and breath sounds normal. No respiratory distress. No wheezes.No rales.  Abdominal: Soft. Bowel sounds are normal. No distension. There is no tenderness. There is no guarding.  Musculoskeletal: No edema or tenderness.  Lymphadenopathy: No cervical, axillary or supraclavicular adenopathy.  Neurological: Alert and oriented to person, place, and time. No cranial nerve deficit.  Skin: Skin is warm and dry. No rash noted. No erythema. No pallor.  Psychiatric: Affect and judgment normal.   Labs Appointment on 07/22/2018  Component Date Value Ref Range Status  . Ferritin 07/22/2018 9* 11 - 307 ng/mL Final   Performed at [facility], [place] [address] [place], [place] [address]  . Iron 07/22/2018 29  28 - 170 ug/dL Final  . TIBC 07/22/2018 372  250 - 450 ug/dL Final  . Saturation Ratios 07/22/2018 8* 10.4 - 31.8 % Final  . UIBC 07/22/2018 343  ug/dL Final   Performed at [facility], [place] [address] [place], [place] [address]  . WBC 07/22/2018 10.3  4.0 - 10.5 K/uL Final  . RBC 07/22/2018 3.02* 3.87 - 5.11 MIL/uL Final  . Hemoglobin 07/22/2018 8.2* 12.0 - 15.0 g/dL Final  . HCT 07/22/2018 26.6* 36.0 - 46.0 % Final  . MCV 07/22/2018 88.1  78.0 - 100.0 fL Final  . MCH 07/22/2018 27.2  26.0 - 34.0 pg Final  . MCHC 07/22/2018 30.8  30.0 - 36.0 g/dL Final  . RDW 07/22/2018 18.5* 11.5 - 15.5 % Final  . Platelets 07/22/2018 268  150 - 400 K/uL Final  . Neutrophils Relative % 07/22/2018 73  % Final  . Neutro Abs 07/22/2018 7.5  1.7 - 7.7 K/uL Final  .  Lymphocytes Relative 07/22/2018 16  % Final  . Lymphs Abs 07/22/2018 1.7  0.7 - 4.0 K/uL Final  . Monocytes Relative 07/22/2018 9  % Final  . Monocytes Absolute 07/22/2018 0.9  0.1 - 1.0 K/uL Final  . Eosinophils Relative 07/22/2018 2  % Final  . Eosinophils Absolute 07/22/2018 0.2  0.0 - 0.7 K/uL Final  . Basophils Relative 07/22/2018 0  % Final  . Basophils Absolute 07/22/2018 0.0  0.0 - 0.1 K/uL Final   Performed at [facility], [address], [place], [place] [address]  . Sodium 07/22/2018 142  135 - 145 mmol/L Final  . Potassium 07/22/2018 4.1  3.5 - 5.1 mmol/L Final  . Chloride 07/22/2018 108  98 - 111 mmol/L Final  . CO2 07/22/2018 27  22 - 32 mmol/L Final  . Glucose, Bld 07/22/2018 77  70 - 99 mg/dL Final  . BUN 07/22/2018 23  8 - 23 mg/dL Final  . Creatinine, Ser 07/22/2018 1.10* 0.44 - 1.00 mg/dL Final  . Calcium 07/22/2018 8.3* 8.9 - 10.3 mg/dL Final  . Total Protein 07/22/2018 6.7  6.5 - 8.1 g/dL Final  . Albumin 07/22/2018  3.3* 3.5 - 5.0 g/dL Final  . AST 07/22/2018 13* 15 - 41 U/L Final  . ALT 07/22/2018 14  0 - 44 U/L Final  . Alkaline Phosphatase 07/22/2018 149* 38 - 126 U/L Final  . Total Bilirubin 07/22/2018 0.5  0.3 - 1.2 mg/dL Final  . GFR calc non Af Amer 07/22/2018 48* >60 mL/min Final  . GFR calc Af Amer 07/22/2018 55* >60 mL/min Final   Comment: (NOTE) The eGFR has been calculated using the CKD EPI equation. This calculation has not been validated in all clinical situations. eGFR's persistently <60 mL/min signify possible Chronic Kidney Disease.   Georgiann Hahn gap 07/22/2018 7  5 - 15 Final   Performed at [facility], [address], [place], [place] [address]     Pathology Orders Placed This Encounter  Procedures  . CBC with Differential/Platelet    Standing Status:   Future    Standing Expiration Date:   07/24/2019  . Comprehensive metabolic panel    Standing Status:   Future    Standing Expiration Date:   07/24/2019  . Lactate dehydrogenase     Standing Status:   Future    Standing Expiration Date:   07/24/2019  . Ferritin    Standing Status:   Future    Standing Expiration Date:   07/24/2019  . Protein electrophoresis, serum    Standing Status:   Future    Standing Expiration Date:   07/24/2019  . IgG, IgA, IgM    Standing Status:   Future    Standing Expiration Date:   07/24/2019  . Kappa/lambda light chains    Standing Status:   Future    Standing Expiration Date:   07/24/2019  . Beta 2 microglobulin, serum    Standing Status:   Future    Standing Expiration Date:   07/24/2019       Zoila Shutter MD

## 2018-07-23 NOTE — Patient Instructions (Signed)
[facility] at [facility] Discharge Instructions  Today you saw Dr. Higgs.    Thank you for choosing [facility] at [facility] to provide your oncology and hematology care.  To afford each patient quality time with our provider, please arrive at least 15 minutes before your scheduled appointment time.   If you have a lab appointment with the Cancer Center please come in thru the  Main Entrance and check in at the main information desk  You need to re-schedule your appointment should you arrive 10 or more minutes late.  We strive to give you quality time with our providers, and arriving late affects you and other patients whose appointments are after yours.  Also, if you no show three or more times for appointments you may be dismissed from the clinic at the providers discretion.     Again, thank you for choosing [facility].  Our hope is that these requests will decrease the amount of time that you wait before being seen by our physicians.       _____________________________________________________________  Should you have questions after your visit to [facility], please contact our office at [phone] between the hours of 8:30 a.m. and 4:30 p.m.  Voicemails left after 4:30 p.m. will not be returned until the following business day.  For prescription refill requests, have your pharmacy contact our office.       Resources For Cancer Patients and their Caregivers ? [employer]: Can assist with transportation, wigs, general needs, runs Look Good Feel Better.        1-888-227-6333 ? Cancer Care: Provides financial assistance, online support groups, medication/co-pay assistance.  1-800-813-HOPE (4673) ? [facility] Assists [place] cancer patients and their families through emotional , educational and financial support.  336-427-4357 ? [place] DSS Where to apply for  food stamps, Medicaid and utility assistance. 336-342-1394 ? [facility]: Transportation to medical appointments. 336-347-2287 ? Social Security Administration: May apply for disability if have a Stage IV cancer. 336-342-7796 1-800-772-1213 ? [place] [facility]: Assists with nutrition, care and transit needs. 336-349-2343  Cancer Center Support Programs:   > Cancer Support Group  2nd Tuesday of the month 1pm-2pm, Journey Room   > Creative Journey  3rd Tuesday of the month 1130am-1pm, Journey Room    

## 2018-07-25 ENCOUNTER — Inpatient Hospital Stay (HOSPITAL_COMMUNITY): Payer: Medicare Other

## 2018-07-25 NOTE — Progress Notes (Signed)
Patient for an iron infusion today. Multiple attempts to get IV peripheral access, unable to do so today. Patient wanted to reschedule and try again. Encouraged pt to drink plenty of fluids prior to infusion.   Vitals stable and discharged home from clinic ambulatory. Follow up as scheduled.

## 2018-07-28 ENCOUNTER — Encounter: Payer: Self-pay | Admitting: Internal Medicine

## 2018-07-29 DIAGNOSIS — M179 Osteoarthritis of knee, unspecified: Secondary | ICD-10-CM | POA: Diagnosis not present

## 2018-07-29 DIAGNOSIS — E119 Type 2 diabetes mellitus without complications: Secondary | ICD-10-CM | POA: Diagnosis not present

## 2018-07-29 DIAGNOSIS — M2141 Flat foot [pes planus] (acquired), right foot: Secondary | ICD-10-CM | POA: Diagnosis not present

## 2018-07-29 DIAGNOSIS — M2142 Flat foot [pes planus] (acquired), left foot: Secondary | ICD-10-CM | POA: Diagnosis not present

## 2018-08-01 ENCOUNTER — Inpatient Hospital Stay (HOSPITAL_COMMUNITY): Payer: Medicare Other | Attending: Internal Medicine

## 2018-08-01 VITALS — BP 158/61 | HR 65 | Temp 98.0°F | Resp 18

## 2018-08-01 DIAGNOSIS — D509 Iron deficiency anemia, unspecified: Secondary | ICD-10-CM | POA: Diagnosis not present

## 2018-08-01 DIAGNOSIS — D508 Other iron deficiency anemias: Secondary | ICD-10-CM

## 2018-08-01 MED ORDER — SODIUM CHLORIDE 0.9 % IV SOLN
INTRAVENOUS | Status: DC
  Administered 2018-08-01: 15:00:00 via INTRAVENOUS

## 2018-08-01 MED ORDER — SODIUM CHLORIDE 0.9 % IV SOLN
750.0000 mg | Freq: Once | INTRAVENOUS | Status: AC
  Administered 2018-08-01: 750 mg via INTRAVENOUS
  Filled 2018-08-01: qty 15

## 2018-08-01 NOTE — Patient Instructions (Signed)
[facility] at [facility]  Discharge Instructions:  You received an iron infusion today.  _______________________________________________________________  Thank you for choosing [facility] at [facility] to provide your oncology and hematology care.  To afford each patient quality time with our providers, please arrive at least 15 minutes before your scheduled appointment.  You need to re-schedule your appointment if you arrive 10 or more minutes late.  We strive to give you quality time with our providers, and arriving late affects you and other patients whose appointments are after yours.  Also, if you no show three or more times for appointments you may be dismissed from the clinic.  Again, thank you for choosing [facility] at [facility]. Our hope is that these requests will allow you access to exceptional care and in a timely manner. _______________________________________________________________  If you have questions after your visit, please contact our office at [phone] between the hours of 8:30 a.m. and 5:00 p.m. Voicemails left after 4:30 p.m. will not be returned until the following business day. _______________________________________________________________  For prescription refill requests, have your pharmacy contact our office. _______________________________________________________________  Recommendations made by the consultant and any test results will be sent to your referring physician. _______________________________________________________________ 

## 2018-08-01 NOTE — Progress Notes (Signed)
Patient tolerated iron infusion with no complaints voiced. Good blood return noted before and after administration of iron.  No complaints of pain at site.  Band aid applied.  VSS with discharge and left ambulatory with no s/s of distress noted.   

## 2018-08-08 ENCOUNTER — Encounter (HOSPITAL_COMMUNITY): Payer: Self-pay

## 2018-08-08 ENCOUNTER — Other Ambulatory Visit: Payer: Self-pay

## 2018-08-08 ENCOUNTER — Inpatient Hospital Stay (HOSPITAL_COMMUNITY): Payer: Medicare Other | Attending: Internal Medicine

## 2018-08-08 VITALS — BP 141/50 | HR 70 | Temp 98.0°F | Resp 16

## 2018-08-08 DIAGNOSIS — D508 Other iron deficiency anemias: Secondary | ICD-10-CM

## 2018-08-08 DIAGNOSIS — D509 Iron deficiency anemia, unspecified: Secondary | ICD-10-CM | POA: Diagnosis not present

## 2018-08-08 MED ORDER — SODIUM CHLORIDE 0.9% FLUSH
10.0000 mL | Freq: Once | INTRAVENOUS | Status: AC
  Administered 2018-08-08: 10 mL via INTRAVENOUS

## 2018-08-08 MED ORDER — SODIUM CHLORIDE 0.9 % IV SOLN
INTRAVENOUS | Status: DC
  Administered 2018-08-08: 15:00:00 via INTRAVENOUS

## 2018-08-08 MED ORDER — SODIUM CHLORIDE 0.9 % IV SOLN
750.0000 mg | Freq: Once | INTRAVENOUS | Status: AC
  Administered 2018-08-08: 750 mg via INTRAVENOUS
  Filled 2018-08-08: qty 15

## 2018-08-08 NOTE — Patient Instructions (Signed)
[facility] at [facility]  Discharge Instructions:  You received an iron infusion today.  _______________________________________________________________  Thank you for choosing [facility] at [facility] to provide your oncology and hematology care.  To afford each patient quality time with our providers, please arrive at least 15 minutes before your scheduled appointment.  You need to re-schedule your appointment if you arrive 10 or more minutes late.  We strive to give you quality time with our providers, and arriving late affects you and other patients whose appointments are after yours.  Also, if you no show three or more times for appointments you may be dismissed from the clinic.  Again, thank you for choosing [facility] at [facility]. Our hope is that these requests will allow you access to exceptional care and in a timely manner. _______________________________________________________________  If you have questions after your visit, please contact our office at [phone] between the hours of 8:30 a.m. and 5:00 p.m. Voicemails left after 4:30 p.m. will not be returned until the following business day. _______________________________________________________________  For prescription refill requests, have your pharmacy contact our office. _______________________________________________________________  Recommendations made by the consultant and any test results will be sent to your referring physician. _______________________________________________________________ 

## 2018-08-08 NOTE — Progress Notes (Signed)
Patient tolerated iron infusion with no complaints voiced.  Good blood return noted before and after administration of iron.  Peripheral IV site clean and dry with no complaints of pain at site. Band aid applied.  VSS with discharge and left ambulatory with no s/s of distress noted.

## 2018-08-18 ENCOUNTER — Other Ambulatory Visit: Payer: Self-pay | Admitting: Gastroenterology

## 2018-08-18 ENCOUNTER — Other Ambulatory Visit: Payer: Self-pay | Admitting: Family Medicine

## 2018-09-15 ENCOUNTER — Inpatient Hospital Stay (HOSPITAL_COMMUNITY): Payer: Medicare Other | Attending: Hematology

## 2018-09-15 DIAGNOSIS — Z87891 Personal history of nicotine dependence: Secondary | ICD-10-CM | POA: Insufficient documentation

## 2018-09-15 DIAGNOSIS — E039 Hypothyroidism, unspecified: Secondary | ICD-10-CM | POA: Insufficient documentation

## 2018-09-15 DIAGNOSIS — D509 Iron deficiency anemia, unspecified: Secondary | ICD-10-CM | POA: Diagnosis not present

## 2018-09-15 DIAGNOSIS — Z79899 Other long term (current) drug therapy: Secondary | ICD-10-CM | POA: Diagnosis not present

## 2018-09-15 DIAGNOSIS — D508 Other iron deficiency anemias: Secondary | ICD-10-CM

## 2018-09-15 DIAGNOSIS — E1122 Type 2 diabetes mellitus with diabetic chronic kidney disease: Secondary | ICD-10-CM | POA: Insufficient documentation

## 2018-09-15 DIAGNOSIS — I129 Hypertensive chronic kidney disease with stage 1 through stage 4 chronic kidney disease, or unspecified chronic kidney disease: Secondary | ICD-10-CM | POA: Insufficient documentation

## 2018-09-15 DIAGNOSIS — D472 Monoclonal gammopathy: Secondary | ICD-10-CM | POA: Diagnosis not present

## 2018-09-15 DIAGNOSIS — D631 Anemia in chronic kidney disease: Secondary | ICD-10-CM | POA: Insufficient documentation

## 2018-09-15 DIAGNOSIS — N183 Chronic kidney disease, stage 3 (moderate): Secondary | ICD-10-CM | POA: Insufficient documentation

## 2018-09-15 LAB — CBC WITH DIFFERENTIAL/PLATELET
BASOS ABS: 0 10*3/uL (ref 0.0–0.1)
Basophils Relative: 0 %
EOS ABS: 0.3 10*3/uL (ref 0.0–0.7)
Eosinophils Relative: 3 %
HCT: 34 % — ABNORMAL LOW (ref 36.0–46.0)
HEMOGLOBIN: 10.3 g/dL — AB (ref 12.0–15.0)
LYMPHS PCT: 19 %
Lymphs Abs: 1.7 10*3/uL (ref 0.7–4.0)
MCH: 27.8 pg (ref 26.0–34.0)
MCHC: 30.3 g/dL (ref 30.0–36.0)
MCV: 91.6 fL (ref 78.0–100.0)
Monocytes Absolute: 0.7 10*3/uL (ref 0.1–1.0)
Monocytes Relative: 8 %
NEUTROS PCT: 70 %
Neutro Abs: 6.2 10*3/uL (ref 1.7–7.7)
Platelets: 266 10*3/uL (ref 150–400)
RBC: 3.71 MIL/uL — AB (ref 3.87–5.11)
RDW: 19.8 % — ABNORMAL HIGH (ref 11.5–15.5)
WBC: 9 10*3/uL (ref 4.0–10.5)

## 2018-09-15 LAB — COMPREHENSIVE METABOLIC PANEL
ALT: 16 U/L (ref 0–44)
ANION GAP: 6 (ref 5–15)
AST: 15 U/L (ref 15–41)
Albumin: 3.7 g/dL (ref 3.5–5.0)
Alkaline Phosphatase: 169 U/L — ABNORMAL HIGH (ref 38–126)
BUN: 23 mg/dL (ref 8–23)
CO2: 25 mmol/L (ref 22–32)
Calcium: 8.5 mg/dL — ABNORMAL LOW (ref 8.9–10.3)
Chloride: 109 mmol/L (ref 98–111)
Creatinine, Ser: 1.13 mg/dL — ABNORMAL HIGH (ref 0.44–1.00)
GFR, EST AFRICAN AMERICAN: 54 mL/min — AB (ref >60)
GFR, EST NON AFRICAN AMERICAN: 46 mL/min — AB (ref >60)
Glucose, Bld: 85 mg/dL (ref 70–99)
POTASSIUM: 4.2 mmol/L (ref 3.5–5.1)
SODIUM: 140 mmol/L (ref 135–145)
Total Bilirubin: 0.5 mg/dL (ref 0.3–1.2)
Total Protein: 7.3 g/dL (ref 6.5–8.1)

## 2018-09-15 LAB — LACTATE DEHYDROGENASE: LDH: 203 U/L — AB (ref 98–192)

## 2018-09-15 LAB — FERRITIN: FERRITIN: 186 ng/mL (ref 11–307)

## 2018-09-16 ENCOUNTER — Other Ambulatory Visit: Payer: Self-pay | Admitting: Family Medicine

## 2018-09-16 LAB — PROTEIN ELECTROPHORESIS, SERUM
A/G Ratio: 1 (ref 0.7–1.7)
ALPHA-2-GLOBULIN: 0.7 g/dL (ref 0.4–1.0)
Albumin ELP: 3.4 g/dL (ref 2.9–4.4)
Alpha-1-Globulin: 0.3 g/dL (ref 0.0–0.4)
Beta Globulin: 1.1 g/dL (ref 0.7–1.3)
GLOBULIN, TOTAL: 3.3 g/dL (ref 2.2–3.9)
Gamma Globulin: 1.2 g/dL (ref 0.4–1.8)
Total Protein ELP: 6.7 g/dL (ref 6.0–8.5)

## 2018-09-16 LAB — KAPPA/LAMBDA LIGHT CHAINS
KAPPA FREE LGHT CHN: 33.2 mg/L — AB (ref 3.3–19.4)
KAPPA, LAMDA LIGHT CHAIN RATIO: 1.54 (ref 0.26–1.65)
Lambda free light chains: 21.6 mg/L (ref 5.7–26.3)

## 2018-09-16 LAB — BETA 2 MICROGLOBULIN, SERUM: BETA 2 MICROGLOBULIN: 3.2 mg/L — AB (ref 0.6–2.4)

## 2018-09-16 LAB — IGG, IGA, IGM
IGA: 280 mg/dL (ref 64–422)
IGM (IMMUNOGLOBULIN M), SRM: 144 mg/dL (ref 26–217)
IgG (Immunoglobin G), Serum: 1159 mg/dL (ref 700–1600)

## 2018-09-18 ENCOUNTER — Other Ambulatory Visit: Payer: Self-pay | Admitting: Orthopedic Surgery

## 2018-09-22 ENCOUNTER — Other Ambulatory Visit: Payer: Self-pay

## 2018-09-22 ENCOUNTER — Inpatient Hospital Stay (HOSPITAL_BASED_OUTPATIENT_CLINIC_OR_DEPARTMENT_OTHER): Payer: Medicare Other | Admitting: Internal Medicine

## 2018-09-22 ENCOUNTER — Encounter (HOSPITAL_COMMUNITY): Payer: Self-pay | Admitting: Internal Medicine

## 2018-09-22 VITALS — BP 159/53 | HR 59 | Temp 98.7°F | Resp 18 | Wt 253.3 lb

## 2018-09-22 DIAGNOSIS — E039 Hypothyroidism, unspecified: Secondary | ICD-10-CM | POA: Diagnosis not present

## 2018-09-22 DIAGNOSIS — D508 Other iron deficiency anemias: Secondary | ICD-10-CM

## 2018-09-22 DIAGNOSIS — E1122 Type 2 diabetes mellitus with diabetic chronic kidney disease: Secondary | ICD-10-CM | POA: Diagnosis not present

## 2018-09-22 DIAGNOSIS — Z79899 Other long term (current) drug therapy: Secondary | ICD-10-CM | POA: Diagnosis not present

## 2018-09-22 DIAGNOSIS — N183 Chronic kidney disease, stage 3 (moderate): Secondary | ICD-10-CM

## 2018-09-22 DIAGNOSIS — Z87891 Personal history of nicotine dependence: Secondary | ICD-10-CM | POA: Diagnosis not present

## 2018-09-22 DIAGNOSIS — D509 Iron deficiency anemia, unspecified: Secondary | ICD-10-CM | POA: Diagnosis not present

## 2018-09-22 DIAGNOSIS — D472 Monoclonal gammopathy: Secondary | ICD-10-CM | POA: Diagnosis not present

## 2018-09-22 DIAGNOSIS — I129 Hypertensive chronic kidney disease with stage 1 through stage 4 chronic kidney disease, or unspecified chronic kidney disease: Secondary | ICD-10-CM

## 2018-09-22 DIAGNOSIS — D631 Anemia in chronic kidney disease: Secondary | ICD-10-CM | POA: Diagnosis not present

## 2018-09-22 NOTE — Progress Notes (Signed)
Diagnosis Other iron deficiency anemia - Plan: CBC with Differential/Platelet, Comprehensive metabolic panel, Lactate dehydrogenase, Ferritin  Staging Cancer Staging No matching staging information was found for the patient.  Assessment and Plan:  1. Iron deficiency anemia.  Pt was last treated with IV iron 07/2018.  Labs done 09/15/2018 reviewed and showed WBC 9 HB 10.3 plts 266,000.  Chemistries WNL with K+ 4.2 Cr 1.13 and normal LFTs.  Ferritin 186 which is improved for ferritin of 9 on labs done 07/22/2018.   Pt will RTC in 10/2018 for follow-up and repeat labs.  PT has appointment with GI 11.03/2018 and she is encouraged to keep that appointment.    2.  GI symptoms.  Pt is reporting trouble swallowing, constipation and nausea. PT has appointment with GI 11.03/2018 and she is encouraged to keep that appointment.  EGD in 2015 showed Schatzki's ring - status post dilation and Hiatal hernia.  3. IgA kappa monoclonal gammopathy. Immunofixation demonstrated an IgA kappa monoclonal gammopathy. She was given option of bone marrow biopsy by Dr. Talbert Cage in the past but did not desire to have procedure done.  Pt had SPEP done 09/15/2018 that was reviewed and was negative. FLC ratio is 1.54.  She has normal quantitative immunoglobulins.    4.  HTN.  BP is 159/53.    Follow-up with PCP.    5.  DM.  Follow-up with PCP for monitoring.    6  Health maintenance.  Follow-up with GI in November as scheduled.    30 minutes spent with more than 50% spent in counseling and coordination of care.    Current Status:  Pt is seen today for follow-up.  She is here to go over labs.  She is complaining of nausea and constipation.    Problem List Patient Active Problem List   Diagnosis Date Noted  . S/P trigger finger release 06/26/18 [Z98.890] 07/09/2018  . Trigger finger, right index finger [M65.321]   . 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 Memory loss of unknown cause [R41.3] 11/10/2017  . Chronic left shoulder pain [M25.512, G89.29] 09/14/2017  . Encounter for chronic pain management [G89.29] 02/09/2017  . Carpopedal spasm [R29.0] 01/18/2017  . 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 Morbid obesity ([place]) [E66.01] 04/18/2015  . Myasthenia gravis in remission ([place]) [G70.00] 11/24/2014  . Myasthenia gravis ([place]) [G70.00] 11/16/2014  . Vitamin D deficiency [E55.9] 05/24/2014  . Osteopenia [M85.80] 04/26/2014  . 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 Esophageal dysphagia [R13.10] 04/03/2012  . Abnormal chest CT [R93.89] 03/02/2012  . Insomnia [G47.00] 01/02/2012  . Essential hypertension, benign [I10] 05/30/2011  . Schatzki's ring [K22.2] 05/07/2011  . INGROWN TOENAIL [L60.0] 01/10/2011  . GLAUCOMA [H40.9] 08/08/2010  . DYSCHROMIA, UNSPECIFIED [L81.9] 08/08/2010  . Chronic renal disease, stage 3, moderately decreased glomerular filtration rate (GFR) between 30-59 mL/min/1.73 square meter ([place]) [N18.3] 09/14/2009  . HIP PAIN, LEFT [M25.559] 06/09/2009  . Urinary incontinence [R32] 06/09/2009  . Iron deficiency anemia [D50.9] 10/02/2008  . Hypothyroidism [E03.9] 09/27/2008  . Hyperlipemia [E78.5] 06/30/2008  . Depression with anxiety [F41.8] 06/30/2008  . GERD [K21.9] 06/30/2008  . Obstructive sleep apnea [G47.33] 06/30/2008  . CARPAL TUNNEL SYNDROME [G56.00] 05/19/2008  . SHOULDER PAIN [M25.519] 02/02/2008  . IMPINGEMENT SYNDROME [M75.80] 02/02/2008  . 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 Diabetes mellitus, insulin  dependent (IDDM), controlled ([place]) [E11.9, Z79.4] 09/09/2007    Past Medical History Past Medical History:  Diagnosis Date  . Anemia in chronic renal disease 12/30/2015  . Anxiety   . Arthritis    "all over" (01/19/2014)  . Arthritis, lumbar spine   .  Carpal tunnel syndrome   . Chronic bronchitis ([place])    "got it q yr for awhile; hasn't had it in awhile" (01/19/2014)  . Chronic kidney disease (CKD) stage G3b/A1, moderately decreased glomerular filtration rate (GFR) between 30-44 mL/min/1.73 square meter and albuminuria creatinine ratio less than 30 mg/g (HCC) 09/14/2009   Qualifier: Diagnosis of  By: Moshe Cipro MD, Joycelyn Schmid    . Chronic renal disease, stage 3, moderately decreased glomerular filtration rate (GFR) between 30-59 mL/min/1.73 square meter (HCC) 09/14/2009   Qualifier: Diagnosis of  By: Moshe Cipro MD, Joycelyn Schmid    . Complication of anesthesia    combative  . Depression   . Diverticulosis 07/2004   Colonscopy Dr Gala Romney  . Esophagitis, erosive 2009  . Exertional shortness of breath   . Gastroesophageal reflux   . ZOXWRUEA(540.9)    "usually a couple times/wk" (01/19/2014)  . Heart murmur    saw cardiology In [place], he told her she did not need to come back.  . Hyperlipidemia   . Hypertension   . Impingement syndrome, shoulder   . Iron deficiency anemia   . Mitral regurgitation   . Myasthenia gravis   . Myasthenia gravis in remission ([place]) 11/24/2014  . Obesity   . OSA on CPAP   . Pneumonia 01/2012  . Pulmonary HTN ([place])   . Schatzki's ring    Last EGD w/ dilation 02/08/11, 2009 & 2007  . Seasonal allergies   . Shoulder pain   . Thyroid disease    "used to take RX; they took me off it" (01/19/2014)  . Tobacco abuse   . Type II diabetes mellitus ([place])     Past Surgical History Past Surgical History:  Procedure Laterality Date  . [place] VITRECTOMY WITH 20 GAUGE MVR PORT Left 01/19/2014   Procedure: 25 GAUGE PARS PLANA VITRECTOMY WITH 20 GAUGE MVR PORT; MEMBRAME PEEL; SERUM PATCH; LASER TREATMENT; C3F8;  Surgeon: Hayden Pedro, MD;  Location: [facility];  Service: Ophthalmology;  Laterality: Left;  . ABDOMINAL HYSTERECTOMY    . APPENDECTOMY    . CARPAL TUNNEL RELEASE Bilateral   . CATARACT EXTRACTION W/PHACO Left  10/21/2013   Procedure: LEFT CATARACT EXTRACTION PHACO AND INTRAOCULAR LENS PLACEMENT (IOC);  Surgeon: Marylynn Pearson, MD;  Location: [facility];  Service: Ophthalmology;  Laterality: Left;  . CHOLECYSTECTOMY    . COLONOSCOPY  05/08/2012   WJX:BJYNWGNF and external hemorrhoids; colonic diverticulosis  . ESOPHAGEAL DILATION     "more than 3 times" (01/19/2014)  . ESOPHAGOGASTRODUODENOSCOPY  02/08/11   [place]-Distal esophageal erosion consistent with mild erosive reflux   esophagitis/ Noncritical Schatzki ring, small hiatal hernia otherwise upper/ gastrointestinal tract appeared unremarkable, status post passage  of a Maloney dilation to biopsy disruption of the ring described  . ESOPHAGOGASTRODUODENOSCOPY  04/2012   2 tandem incomplete distal esophagea rings s/p dilation.   . ESOPHAGOGASTRODUODENOSCOPY N/A 09/16/2014   Procedure: ESOPHAGOGASTRODUODENOSCOPY (EGD);  Surgeon: Daneil Dolin, MD;  [facility];  Service: Endoscopy;  Laterality: N/A;  1200  . EYE SURGERY    . INCONTINENCE SURGERY  08/26/09   [place]  . JOINT REPLACEMENT     right total knee  . MALONEY DILATION N/A 09/16/2014  Procedure: MALONEY DILATION;  Surgeon: Daneil Dolin, MD;  [facility];  Service: Endoscopy;  Laterality: N/A;  . PARS PLANA VITRECTOMY W/ REPAIR OF MACULAR HOLE Left 01/19/2014  . SAVORY DILATION N/A 09/16/2014   Procedure: SAVORY DILATION;  Surgeon: Daneil Dolin, MD;  [facility];  Service: Endoscopy;  Laterality: N/A;  . TONSILLECTOMY    . TOTAL KNEE ARTHROPLASTY Right 05/13/07   Dr. Aline Brochure  . TRIGGER FINGER RELEASE Right 06/26/2018   Procedure: RIGHT INDEX FINGER TRIGGER FINGER/A-1 PULLEY RELEASE;  Surgeon: Carole Civil, MD;  [facility];  Service: Orthopedics;  Laterality: Right;    Family History Family History  Problem Relation Age of Onset  . Cancer Mother   . Heart disease Mother   . Cancer Father   . Heart disease Father   . Diabetes Sister   .  Hypertension Brother   . Heart disease Brother   . Cancer Sister   . Kidney failure Sister   . Diabetes Son   . Hypertension Son   . Hypertension Daughter   . Colon cancer Neg Hx      Social History  reports that she quit smoking about 14 years ago. Her smoking use included cigarettes. She has a 12.50 pack-year smoking history. She has never used smokeless tobacco. She reports that she does not drink alcohol or use drugs.  Medications  Current Outpatient Medications:  .  amLODipine (NORVASC) 10 MG tablet, TAKE 1 TABLET BY MOUTH ONCE A DAY., Disp: 30 tablet, Rfl: 5 .  B-D ULTRAFINE III SHORT PEN 31G X 8 MM MISC, USE ONCE DAILY WITH LANTUS SOLOSTAR PEN., Disp: 100 each, Rfl: 0 .  brimonidine-timolol (COMBIGAN) 0.2-0.5 % ophthalmic solution, Place 1 drop into both eyes every 12 (twelve) hours., Disp: , Rfl:  .  cetirizine (ZYRTEC ALLERGY) 10 MG tablet, Take 1 tablet (10 mg total) by mouth daily. (Patient taking differently: Take 10 mg by mouth at bedtime. ), Disp: 30 tablet, Rfl: 5 .  cetirizine (ZYRTEC) 10 MG tablet, TAKE 1 TABLET BY MOUTH ONCE A DAY., Disp: 30 tablet, Rfl: 5 .  Cholecalciferol (VITAMIN D3) 2000 units TABS, Take 2,000 Units by mouth at bedtime., Disp: , Rfl:  .  citalopram (CELEXA) 20 MG tablet, TAKE 1 TABLET BY MOUTH ONCE A DAY., Disp: 30 tablet, Rfl: 5 .  Cranberry 1000 MG CAPS, Take 1,000 mg by mouth daily. , Disp: , Rfl:  .  cycloSPORINE (RESTASIS) 0.05 % ophthalmic emulsion, Place 1 drop into both eyes 2 (two) times daily as needed (use scheduled once daily, may repeat if needed for dry eyes.). , Disp: , Rfl:  .  diltiazem (CARDIZEM CD) 240 MG 24 hr capsule, TAKE (1) CAPSULE BY MOUTH TWICE DAILY., Disp: 60 capsule, Rfl: 5 .  docusate sodium (COLACE) 100 MG capsule, Take 100 mg by mouth at bedtime., Disp: , Rfl:  .  ferrous sulfate 325 (65 FE) MG tablet, Take 325 mg by mouth at bedtime., Disp: , Rfl:  .  Flaxseed, Linseed, (FLAXSEED OIL) 1000 MG CAPS, Take 1,000 mg by  mouth daily., Disp: , Rfl:  .  fluticasone (FLONASE) 50 MCG/ACT nasal spray, Place 2 sprays into both nostrils daily. (Patient taking differently: Place 2 sprays into both nostrils daily as needed for allergies. ), Disp: 48 g, Rfl: 1 .  gabapentin (NEURONTIN) 300 MG capsule, TAKE (1) CAPSULE BY MOUTH AT BEDTIME., Disp: 30 capsule, Rfl: 2 .  hydrALAZINE (APRESOLINE) 25 MG tablet, TAKE  1 TABLET BY MOUTH THREE TIMES A DAY., Disp: 90 tablet, Rfl: 3 .  hydrALAZINE (APRESOLINE) 25 MG tablet, TAKE 1 TABLET BY MOUTH THREE TIMES A DAY., Disp: 90 tablet, Rfl: 5 .  HYDROcodone-acetaminophen (NORCO) 10-325 MG tablet, One tablet two times daily for back pain, Disp: 60 tablet, Rfl: 0 .  [START ON 09/28/2018] HYDROcodone-acetaminophen (NORCO) 10-325 MG tablet, One tablet two times daily for back pain, Disp: 60 tablet, Rfl: 0 .  LANTUS SOLOSTAR 100 UNIT/ML Solostar Pen, INJECT 30 UNITS SUBCUTANEOUSLY ONCE DAILY., Disp: 15 mL, Rfl: 0 .  losartan (COZAAR) 100 MG tablet, TAKE (1) TABLET BY MOUTH DAILY., Disp: 30 tablet, Rfl: 5 .  Multiple Vitamin (MULTIVITAMIN WITH MINERALS) TABS tablet, Take 1 tablet by mouth at bedtime., Disp: , Rfl:  .  ondansetron (ZOFRAN) 4 MG tablet, TAKE (1) TABLET BY MOUTH EVERY FOUR HOURS AS NEEDED FOR NAUSEA., Disp: 30 tablet, Rfl: 0 .  ONETOUCH VERIO test strip, USE AS DIRECTED TO TEST BLOOD GLUCOSE 3 TIMES DAILY., Disp: 100 each, Rfl: 11 .  PAIN RELIEF EXTRA STRENGTH 500 MG tablet, TAKE (1) TABLET BY MOUTH AT BEDTIME., Disp: 30 tablet, Rfl: 0 .  pantoprazole (PROTONIX) 40 MG tablet, TAKE 1 TABLET BY MOUTH DAILY BEFORE BREAKFAST., Disp: 30 tablet, Rfl: 5 .  polyethylene glycol powder (GLYCOLAX/MIRALAX) powder, MIX 17G (1 CAPFUL) IN 8 OUNCES OF JUICE OR WATER AND DRINK ONCE DAILY. (Patient taking differently: MIX 17G (1 CAPFUL) IN 8 OUNCES OF JUICE OR WATER AND DRINK ONCE DAILY AS NEEDED FOR CONSTIPATION.), Disp: 527 g, Rfl: 3 .  predniSONE (DELTASONE) 5 MG tablet, Take 1 tablet (5 mg total) by  mouth daily., Disp: 90 tablet, Rfl: 1 .  predniSONE (DELTASONE) 5 MG tablet, TAKE 1 TABLET BY MOUTH ONCE DAILY., Disp: 30 tablet, Rfl: 0 .  PROAIR HFA 108 (90 Base) MCG/ACT inhaler, INHALE 2 PUFFS BY MOUTH EVERY 6 HOURS AS NEEDED ., Disp: 8.5 g, Rfl: 5 .  pyridostigmine (MESTINON) 60 MG tablet, TAKE (1) TABLET BY MOUTH THREE TIMES A DAY., Disp: 90 tablet, Rfl: 0 .  rosuvastatin (CRESTOR) 40 MG tablet, TAKE (1) TABLET BY MOUTH AT BEDTIME., Disp: 30 tablet, Rfl: 5 .  solifenacin (VESICARE) 10 MG tablet, TAKE 1 TABLET BY MOUTH ONCE A DAY., Disp: 30 tablet, Rfl: 2 .  spironolactone (ALDACTONE) 25 MG tablet, TAKE 1 TABLET BY MOUTH ONCE A DAY., Disp: 30 tablet, Rfl: 3 .  TRADJENTA 5 MG TABS tablet, TAKE 1 TABLET BY MOUTH ONCE A DAY., Disp: 30 tablet, Rfl: 2 .  UNABLE TO FIND, Diabetic shoes x 1 Inserts x 3 DX E11.9, Disp: 1 each, Rfl: 0 .  venlafaxine XR (EFFEXOR-XR) 150 MG 24 hr capsule, TAKE 1 CAPSULE BY MOUTH DAILY., Disp: 30 capsule, Rfl: 2  Allergies Patient has no known allergies.  Review of Systems Review of Systems - Oncology ROS negative other than trouble swallowing, constipation and nausea.     Physical Exam  Vitals Wt Readings from Last 3 Encounters:  09/22/18 253 lb 4.8 oz (114.9 kg)  07/25/18 248 lb 10.9 oz (112.8 kg)  07/23/18 248 lb 1.6 oz (112.5 kg)   Temp Readings from Last 3 Encounters:  09/22/18 98.7 F (37.1 C) (Oral)  08/08/18 98 F (36.7 C) (Oral)  08/01/18 98 F (36.7 C) (Oral)   BP Readings from Last 3 Encounters:  09/22/18 (!) 159/53  08/08/18 (!) 141/50  08/01/18 (!) 158/61   Pulse Readings from Last 3 Encounters:  09/22/18 (!) 59  08/08/18 70  08/01/18 65   Constitutional: Well-developed, well-nourished, and in no distress.   HENT: Head: Normocephalic and atraumatic.  Mouth/Throat: No oropharyngeal exudate. Mucosa moist. Eyes: Pupils are equal, round, and reactive to light. Conjunctivae are normal. No scleral icterus.  Neck: Normal range of  motion. Neck supple. No JVD present.  Cardiovascular: Normal rate, regular rhythm and normal heart sounds.  Exam reveals no gallop and no friction rub.   No murmur heard. Pulmonary/Chest: Effort normal and breath sounds normal. No respiratory distress. No wheezes.No rales.  Abdominal: Soft. Bowel sounds are normal. No distension. There is no tenderness. There is no guarding.  Musculoskeletal: No edema or tenderness.  Lymphadenopathy: No cervical, axillary or supraclavicular adenopathy.  Neurological: Alert and oriented to person, place, and time. No cranial nerve deficit.  Skin: Skin is warm and dry. No rash noted. No erythema. No pallor.  Psychiatric: Affect and judgment normal.   Labs No visits with results within 3 Day(s) from this visit.  Latest known visit with results is:  Appointment on 09/15/2018  Component Date Value Ref Range Status  . WBC 09/15/2018 9.0  4.0 - 10.5 K/uL Final  . RBC 09/15/2018 3.71* 3.87 - 5.11 MIL/uL Final  . Hemoglobin 09/15/2018 10.3* 12.0 - 15.0 g/dL Final  . HCT 09/15/2018 34.0* 36.0 - 46.0 % Final  . MCV 09/15/2018 91.6  78.0 - 100.0 fL Final  . MCH 09/15/2018 27.8  26.0 - 34.0 pg Final  . MCHC 09/15/2018 30.3  30.0 - 36.0 g/dL Final  . RDW 09/15/2018 19.8* 11.5 - 15.5 % Final  . Platelets 09/15/2018 266  150 - 400 K/uL Final  . Neutrophils Relative % 09/15/2018 70  % Final  . Neutro Abs 09/15/2018 6.2  1.7 - 7.7 K/uL Final  . Lymphocytes Relative 09/15/2018 19  % Final  . Lymphs Abs 09/15/2018 1.7  0.7 - 4.0 K/uL Final  . Monocytes Relative 09/15/2018 8  % Final  . Monocytes Absolute 09/15/2018 0.7  0.1 - 1.0 K/uL Final  . Eosinophils Relative 09/15/2018 3  % Final  . Eosinophils Absolute 09/15/2018 0.3  0.0 - 0.7 K/uL Final  . Basophils Relative 09/15/2018 0  % Final  . Basophils Absolute 09/15/2018 0.0  0.0 - 0.1 K/uL Final   Performed at [facility], [address], [place], [place] [address]  . Sodium 09/15/2018 140  135 - 145 mmol/L Final   . Potassium 09/15/2018 4.2  3.5 - 5.1 mmol/L Final  . Chloride 09/15/2018 109  98 - 111 mmol/L Final  . CO2 09/15/2018 25  22 - 32 mmol/L Final  . Glucose, Bld 09/15/2018 85  70 - 99 mg/dL Final  . BUN 09/15/2018 23  8 - 23 mg/dL Final  . Creatinine, Ser 09/15/2018 1.13* 0.44 - 1.00 mg/dL Final  . Calcium 09/15/2018 8.5* 8.9 - 10.3 mg/dL Final  . Total Protein 09/15/2018 7.3  6.5 - 8.1 g/dL Final  . Albumin 09/15/2018 3.7  3.5 - 5.0 g/dL Final  . AST 09/15/2018 15  15 - 41 U/L Final  . ALT 09/15/2018 16  0 - 44 U/L Final  . Alkaline Phosphatase 09/15/2018 169* 38 - 126 U/L Final  . Total Bilirubin 09/15/2018 0.5  0.3 - 1.2 mg/dL Final  . GFR calc non Af Amer 09/15/2018 46* >60 mL/min Final  . GFR calc Af Amer 09/15/2018 54* >60 mL/min Final   Comment: (NOTE) The eGFR has been calculated using the CKD EPI equation. This calculation has not  been validated in all clinical situations. eGFR's persistently <60 mL/min signify possible Chronic Kidney Disease.   Georgiann Hahn gap 09/15/2018 6  5 - 15 Final   Performed at [facility], [address], [place], [place] [address]  . LDH 09/15/2018 203* 98 - 192 U/L Final   Performed at [facility], [address], [place], [place] [address]  . Ferritin 09/15/2018 186  11 - 307 ng/mL Final   Performed at [facility], [address] [place], [place] [address]  . Total Protein ELP 09/15/2018 6.7  6.0 - 8.5 g/dL Final  . Albumin ELP 09/15/2018 3.4  2.9 - 4.4 g/dL Final  . Alpha-1-Globulin 09/15/2018 0.3  0.0 - 0.4 g/dL Final  . Alpha-2-Globulin 09/15/2018 0.7  0.4 - 1.0 g/dL Final  . Beta Globulin 09/15/2018 1.1  0.7 - 1.3 g/dL Final  . Gamma Globulin 09/15/2018 1.2  0.4 - 1.8 g/dL Final  . M-Spike, % 09/15/2018 Not Observed  Not Observed g/dL Final  . SPE Interp. 09/15/2018 Comment   Final   Comment: (NOTE) The SPE pattern appears essentially unremarkable. Evidence of monoclonal protein is not apparent. Performed At: [facility] [place], [place] [id number] Rush Farmer MD ER:[id number]   . Comment 09/15/2018 Comment   Final   Comment: (NOTE) Protein electrophoresis scan will follow via computer, mail, or courier delivery.   Marland Kitchen GLOBULIN, TOTAL 09/15/2018 3.3  2.2 - 3.9 g/dL Corrected  . A/G Ratio 09/15/2018 1.0  0.7 - 1.7 Corrected  . IgG (Immunoglobin G), Serum 09/15/2018 1,159  700 - 1,600 mg/dL Final  . IgA 09/15/2018 280  64 - 422 mg/dL Final  . IgM (Immunoglobulin M), Srm 09/15/2018 144  26 - 217 mg/dL Final   Comment: (NOTE) Performed At: [facility] [place], [place] [id number] Rush Farmer MD OV:[id number]   . Kappa free light chain 09/15/2018 33.2* 3.3 - 19.4 mg/L Final  . Lamda free light chains 09/15/2018 21.6  5.7 - 26.3 mg/L Final  . Kappa, lamda light chain ratio 09/15/2018 1.54  0.26 - 1.65 Final   Comment: (NOTE) Performed At: [facility] [place], [place] [id number] Rush Farmer MD [id number]   . Beta-2 Microglobulin 09/15/2018 3.2* 0.6 - 2.4 mg/L Final   Comment: (NOTE) Siemens Immulite 2000 Immunochemiluminometric assay (ICMA) Values obtained with different assay methods or kits cannot be used interchangeably. Results cannot be interpreted as absolute evidence of the presence or absence of malignant disease. Performed At: [facility] [place], [place] [id number] Rush Farmer MD YT:[id number]      Pathology Orders Placed This Encounter  Procedures  . CBC with Differential/Platelet    Standing Status:   Future    Standing Expiration Date:   09/23/2019  . Comprehensive metabolic panel    Standing Status:   Future    Standing Expiration Date:   09/23/2019  . Lactate dehydrogenase    Standing Status:   Future    Standing Expiration Date:   09/23/2019  . Ferritin    Standing Status:   Future    Standing Expiration Date:   09/23/2019       Zoila Shutter MD

## 2018-10-08 DIAGNOSIS — H401132 Primary open-angle glaucoma, bilateral, moderate stage: Secondary | ICD-10-CM | POA: Diagnosis not present

## 2018-10-16 ENCOUNTER — Encounter: Payer: Self-pay | Admitting: Family Medicine

## 2018-10-16 ENCOUNTER — Ambulatory Visit (INDEPENDENT_AMBULATORY_CARE_PROVIDER_SITE_OTHER): Payer: Medicare Other | Admitting: Family Medicine

## 2018-10-16 VITALS — BP 142/52 | HR 61 | Resp 12 | Ht 66.0 in | Wt 255.0 lb

## 2018-10-16 DIAGNOSIS — E785 Hyperlipidemia, unspecified: Secondary | ICD-10-CM | POA: Diagnosis not present

## 2018-10-16 DIAGNOSIS — Z794 Long term (current) use of insulin: Secondary | ICD-10-CM

## 2018-10-16 DIAGNOSIS — I10 Essential (primary) hypertension: Secondary | ICD-10-CM

## 2018-10-16 DIAGNOSIS — IMO0001 Reserved for inherently not codable concepts without codable children: Secondary | ICD-10-CM

## 2018-10-16 DIAGNOSIS — E119 Type 2 diabetes mellitus without complications: Secondary | ICD-10-CM | POA: Diagnosis not present

## 2018-10-16 DIAGNOSIS — Z23 Encounter for immunization: Secondary | ICD-10-CM | POA: Diagnosis not present

## 2018-10-16 DIAGNOSIS — G8929 Other chronic pain: Secondary | ICD-10-CM | POA: Diagnosis not present

## 2018-10-16 DIAGNOSIS — K219 Gastro-esophageal reflux disease without esophagitis: Secondary | ICD-10-CM

## 2018-10-16 DIAGNOSIS — F418 Other specified anxiety disorders: Secondary | ICD-10-CM

## 2018-10-16 DIAGNOSIS — R52 Pain, unspecified: Secondary | ICD-10-CM

## 2018-10-16 NOTE — Patient Instructions (Addendum)
Annual Physical exam in 13 weeks, call if you need me before Flu vaccine today  Urine test today ( medication)  Labs already ordered today,    New is medication for GERD, and you ar  being referred to Dr Sydell Axon.

## 2018-10-16 NOTE — Progress Notes (Signed)
[name]     MRN: [id number]      DOB: Nov 02, 1941   HPI [name] is here for follow up and re-evaluation of chronic medical conditions, medication management and review of any available recent lab and radiology data.  Preventive health is updated, specifically  Cancer screening and Immunization.   Questions or concerns regarding consultations or procedures which the PT has had in the interim are  addressed. The PT denies any adverse reactions to current medications since the last visit.6 months h/o soreness in stomach when she eats and feeling full easily, notes excess burping, no change in bM Denies polyuria, polydipsia, blurred vision , or hypoglycemic episodes.  ROS Denies recent fever or chills. Denies sinus pressure, nasal congestion, ear pain or sore throat. Denies chest congestion, productive cough or wheezing. Denies chest pains, palpitations and leg swelling Denies diarrhea or constipation.   Denies dysuria, frequency, hesitancy or incontinence. C/o chronic  joint pain, swelling and limitation in mobility. Denies headaches, seizures, numbness, or tingling. Denies uncontrolled  depression, anxiety or insomnia. Denies skin break down or rash.   PE  BP (!) 150/52 (BP Location: Right Arm, Patient Position: Sitting, Cuff Size: Large)   Pulse 61   Resp 12   Ht 5\' 6"  (1.676 m)   Wt 255 lb (115.7 kg)   SpO2 96% Comment: room air  BMI 41.16 kg/m   Patient alert and oriented and in no cardiopulmonary distress.  HEENT: No facial asymmetry, EOMI,   oropharynx pink and moist.  Neck supple no JVD, no mass.  Chest: Clear to auscultation bilaterally.  CVS: S1, S2 no murmurs, no S3.Regular rate.  ABD: Soft non tender.   Ext: No edema  MS: decreased ROM spine, shoulders, hips and knees.  Skin: Intact, no ulcerations or rash noted.  Psych: Good eye contact, normal affect. Memory intact not anxious or depressed appearing.  CNS: CN 2-12 intact, power,  normal  throughout.no focal deficits noted.   Assessment & Plan  Encounter for chronic pain management The patient's Controlled Substance registry is reviewed and compliance confirmed. Adequacy of  Pain control and level of function is assessed. Medication dosing is adjusted as deemed appropriate. Twelve weeks of medication is prescribed , patient signs for the script and is provided with a follow up appointment between 11 to 12 weeks .   GERD Uncontrolled with early satiety and excessive bloating and belching, also has some dysphagia, refer to GI  Essential hypertension, benign Not at goal at visIt but generally controlled , IMPROVED, NO MED CHANGE DASH diet and commitment to daily physical activity for a minimum of 30 minutes discussed and encouraged, as a part of hypertension management. The importance of attaining a healthy weight is also discussed.  BP/Weight 10/16/2018 09/22/2018 08/08/2018 08/01/2018 07/25/2018 07/23/2018 2/67/1245  Systolic BP 809 983 382 505 397 673 419  Diastolic BP 52 53 50 61 55 49 60  Wt. (Lbs) 255 253.3 - - 248.68 248.1 246.08  BMI 41.16 40.88 - - 40.14 40.04 39.72       Diabetes mellitus, insulin dependent (IDDM), controlled (HCC) Controlled, no change in medication [name] is reminded of the importance of commitment to daily physical activity for 30 minutes or more, as able and the need to limit carbohydrate intake to 30 to 60 grams per meal to help with blood sugar control.   The need to take medication as prescribed, test blood sugar as directed, and to call between visits if there is  a concern that blood sugar is uncontrolled is also discussed.   [name] is reminded of the importance of daily foot exam, annual eye examination, and good blood sugar, blood pressure and cholesterol control.  Diabetic Labs Latest Ref Rng & Units 10/16/2018 09/15/2018 07/22/2018 06/24/2018 03/31/2018  HbA1c <5.7 % of total Hgb 6.1(H) - - 6.2(H) 6.3(H)  Microalbumin mg/dL -  - - - 17.1  Micro/Creat Ratio <30 mcg/mg creat - - - - 118(H)  Chol <200 mg/dL 215(H) - - - 191  HDL >50 mg/dL 51 - - - 50(L)  Calc LDL mg/dL (calc) 145(H) - - - 124(H)  Triglycerides <150 mg/dL 89 - - - 71  Creatinine 0.60 - 0.93 mg/dL 1.22(H) 1.13(H) 1.10(H) 1.04(H) 0.97(H)   BP/Weight 10/16/2018 09/22/2018 08/08/2018 08/01/2018 07/25/2018 07/23/2018 6/43/8377  Systolic BP 939 688 648 472 072 182 883  Diastolic BP 52 53 50 61 55 49 60  Wt. (Lbs) 255 253.3 - - 248.68 248.1 246.08  BMI 41.16 40.88 - - 40.14 40.04 39.72   Foot/eye exam completion dates Latest Ref Rng & Units 07/17/2018 04/14/2018  Eye Exam No Retinopathy - Retinopathy(A)  Foot Form Completion - Done -        Morbid obesity Deteriorated. Patient re-educated about  the importance of commitment to a  minimum of 150 minutes of exercise per week.  The importance of healthy food choices with portion control discussed. Encouraged to start a food diary, count calories and to consider  joining a support group. Sample diet sheets offered. Goals set by the patient for the next several months.   Weight /BMI 10/16/2018 09/22/2018 07/25/2018  WEIGHT 255 lb 253 lb 4.8 oz 248 lb 10.9 oz  HEIGHT 5\' 6"  - -  BMI 41.16 kg/m2 40.88 kg/m2 40.14 kg/m2      Depression with anxiety Controlled, no change in medication

## 2018-10-16 NOTE — Progress Notes (Signed)
Fill Date ID Written Drug Qty Days Prescriber Rx # Pharmacy Refill Daily Dose * Pymt Type PMP  09/29/2018  1  07/17/2018  Hydrocodone-Acetamin 10-325 Mg  60.00 30 Ma Sim  [id number]  Rxc ([id number])  0/0 20.00 MME Medicare  [place]  08/28/2018  1  07/17/2018  Hydrocodone-Acetamin 10-325 Mg  60.00 30 Ma Sim  [id number]  Rxc ([id number])  0/0 20.00 MME Medicare  [place]  07/28/2018  1  07/17/2018  Hydrocodone-Acetamin 10-325 Mg  60.00 30 Ma Sim  [id number]  Rxc (1834)  0/0 20.00 MME Medicare  [place]  06/30/2018  1  03/31/2018  Hydrocodone-Acetamin 10-325 Mg  60.00 4 Ma Sim  [id number]  Rxc (8478)  0/0 20.00 MME Medicare  [place]  06/02/2018  1  05/03/2018  Hydrocodone-Acetamin 10-325 Mg  60.00 51 Ma Sim  [id number]  Rxc (9167)  0/0 20.00 MME Medicare  [place]  05/02/2018  1  01/07/2018  Hydrocodone-Acetamin 10-325 Mg  60.00 75 Ma Sim  [id number]  Rxc (7471)  0/0 20.00 MME Medicare  [place]  04/01/2018  1  04/01/2018  Hydrocodone-Acetamin 10-325 Mg  60.00 2 Ma Sim  [id number]  Rxc (8257)  0/0 20.00 MME Medicare   [place]  03/03/2018  1  01/07/2018  Hydrocodone-Acetamin 10-325 Mg  60.00 23 Ma Sim  [id number]  Rxc (4715)  0/0 20.00 MME Medicare  [place]  02/04/2018  1  01/07/2018  Hydrocodone-Acetamin 10-325 Mg  60.00 6 Ma Sim  [id number]  Rxc (9791)  0/0 20.00 MME Medicare  [place]  01/03/2018  1  11/06/2017  Hydrocodone-Acetamin 10-325 Mg  60.00 52 Ma Sim  [id number]  Rxc (8377)  0/0 20.00 MME Medicare  [place]  12/03/2017  1  11/06/2017  Hydrocodone-Acetamin 10-325 Mg  60.00 61 Ma Sim  [id number]  Rxc (9167)  0/0 20.00 MME Medicare  [place]

## 2018-10-17 ENCOUNTER — Other Ambulatory Visit (HOSPITAL_COMMUNITY)
Admission: RE | Admit: 2018-10-17 | Discharge: 2018-10-17 | Disposition: A | Discharge status: Home / Self Care | Payer: Medicare Other | Source: Other Acute Inpatient Hospital | Attending: Family Medicine | Admitting: Family Medicine

## 2018-10-17 ENCOUNTER — Other Ambulatory Visit: Payer: Self-pay | Admitting: Family Medicine

## 2018-10-17 DIAGNOSIS — R52 Pain, unspecified: Secondary | ICD-10-CM | POA: Insufficient documentation

## 2018-10-17 LAB — COMPLETE METABOLIC PANEL WITH GFR
AG RATIO: 1.2 (calc) (ref 1.0–2.5)
ALBUMIN MSPROF: 3.7 g/dL (ref 3.6–5.1)
ALT: 10 U/L (ref 6–29)
AST: 12 U/L (ref 10–35)
Alkaline phosphatase (APISO): 231 U/L — ABNORMAL HIGH (ref 33–130)
BILIRUBIN TOTAL: 0.4 mg/dL (ref 0.2–1.2)
BUN / CREAT RATIO: 19 (calc) (ref 6–22)
BUN: 23 mg/dL (ref 7–25)
CALCIUM: 9.2 mg/dL (ref 8.6–10.4)
CO2: 25 mmol/L (ref 20–32)
Chloride: 110 mmol/L (ref 98–110)
Creat: 1.22 mg/dL — ABNORMAL HIGH (ref 0.60–0.93)
GFR, EST AFRICAN AMERICAN: 50 mL/min/{1.73_m2} — AB (ref >=60)
GFR, EST NON AFRICAN AMERICAN: 43 mL/min/{1.73_m2} — AB (ref >=60)
GLOBULIN: 3 g/dL (ref 1.9–3.7)
Glucose, Bld: 71 mg/dL (ref 65–99)
POTASSIUM: 4.9 mmol/L (ref 3.5–5.3)
Sodium: 143 mmol/L (ref 135–146)
TOTAL PROTEIN: 6.7 g/dL (ref 6.1–8.1)

## 2018-10-17 LAB — HEMOGLOBIN A1C
Hgb A1c MFr Bld: 6.1 % of total Hgb — ABNORMAL HIGH (ref <5.7)
Mean Plasma Glucose: 128 (calc)
eAG (mmol/L): 7.1 (calc)

## 2018-10-17 LAB — LIPID PANEL
CHOLESTEROL: 215 mg/dL — AB (ref <200)
HDL: 51 mg/dL (ref >50)
LDL Cholesterol (Calc): 145 mg/dL (calc) — ABNORMAL HIGH
Non-HDL Cholesterol (Calc): 164 mg/dL (calc) — ABNORMAL HIGH (ref <130)
TRIGLYCERIDES: 89 mg/dL (ref <150)
Total CHOL/HDL Ratio: 4.2 (calc) (ref <5.0)

## 2018-10-18 LAB — MISC LABCORP TEST (SEND OUT): Labcorp test code: 733692

## 2018-10-19 MED ORDER — HYDROCODONE-ACETAMINOPHEN 10-325 MG PO TABS: ORAL_TABLET | ORAL | 0 refills | Status: DC

## 2018-10-19 MED ORDER — EZETIMIBE 10 MG PO TABS: 10.0000 mg | ORAL_TABLET | Freq: Every day | ORAL | 5 refills | Status: DC

## 2018-10-19 MED ORDER — HYDROCODONE-ACETAMINOPHEN 10-325 MG PO TABS: ORAL_TABLET | ORAL | 0 refills | Status: AC

## 2018-10-19 NOTE — Assessment & Plan Note (Signed)
Not at goal at visIt but generally controlled , IMPROVED, NO MED CHANGE DASH diet and commitment to daily physical activity for a minimum of 30 minutes discussed and encouraged, as a part of hypertension management. The importance of attaining a healthy weight is also discussed.  BP/Weight 10/16/2018 09/22/2018 08/08/2018 08/01/2018 07/25/2018 07/23/2018 04/12/8207  Systolic BP 138 871 959 747 185 501 586  Diastolic BP 52 53 50 61 55 49 60  Wt. (Lbs) 255 253.3 - - 248.68 248.1 246.08  BMI 41.16 40.88 - - 40.14 40.04 39.72

## 2018-10-19 NOTE — Assessment & Plan Note (Signed)
Uncontrolled with early satiety and excessive bloating and belching, also has some dysphagia, refer to GI

## 2018-10-19 NOTE — Assessment & Plan Note (Signed)
Not at [place] , add zetia Hyperlipidemia:Low fat diet discussed and encouraged.   Lipid Panel  Lab Results  Component Value Date   CHOL 215 (H) 10/16/2018   HDL 51 10/16/2018   LDLCALC 145 (H) 10/16/2018   TRIG 89 10/16/2018   CHOLHDL 4.2 10/16/2018

## 2018-10-19 NOTE — Assessment & Plan Note (Signed)
The patient's Controlled Substance registry is reviewed and compliance confirmed. Adequacy of  Pain control and level of function is assessed. Medication dosing is adjusted as deemed appropriate. Twelve weeks of medication is prescribed , patient signs for the script and is provided with a follow up appointment between 11 to 12 weeks .  

## 2018-10-19 NOTE — Assessment & Plan Note (Signed)
Controlled, no change in medication  

## 2018-10-19 NOTE — Assessment & Plan Note (Signed)
Deteriorated. Patient re-educated about  the importance of commitment to a  minimum of 150 minutes of exercise per week.  The importance of healthy food choices with portion control discussed. Encouraged to start a food diary, count calories and to consider  joining a support group. Sample diet sheets offered. Goals set by the patient for the next several months.   Weight /BMI 10/16/2018 09/22/2018 07/25/2018  WEIGHT 255 lb 253 lb 4.8 oz 248 lb 10.9 oz  HEIGHT 5\' 6"  - -  BMI 41.16 kg/m2 40.88 kg/m2 40.14 kg/m2

## 2018-10-19 NOTE — Assessment & Plan Note (Signed)
Controlled, no change in medication [name] is reminded of the importance of commitment to daily physical activity for 30 minutes or more, as able and the need to limit carbohydrate intake to 30 to 60 grams per meal to help with blood sugar control.   The need to take medication as prescribed, test blood sugar as directed, and to call between visits if there is a concern that blood sugar is uncontrolled is also discussed.   [name] is reminded of the importance of daily foot exam, annual eye examination, and good blood sugar, blood pressure and cholesterol control.  Diabetic Labs Latest Ref Rng & Units 10/16/2018 09/15/2018 07/22/2018 06/24/2018 03/31/2018  HbA1c <5.7 % of total Hgb 6.1(H) - - 6.2(H) 6.3(H)  Microalbumin mg/dL - - - - 17.1  Micro/Creat Ratio <30 mcg/mg creat - - - - 118(H)  Chol <200 mg/dL 215(H) - - - 191  HDL >50 mg/dL 51 - - - 50(L)  Calc LDL mg/dL (calc) 145(H) - - - 124(H)  Triglycerides <150 mg/dL 89 - - - 71  Creatinine 0.60 - 0.93 mg/dL 1.22(H) 1.13(H) 1.10(H) 1.04(H) 0.97(H)   BP/Weight 10/16/2018 09/22/2018 08/08/2018 08/01/2018 07/25/2018 07/23/2018 1/66/0600  Systolic BP 459 977 414 239 532 023 343  Diastolic BP 52 53 50 61 55 49 60  Wt. (Lbs) 255 253.3 - - 248.68 248.1 246.08  BMI 41.16 40.88 - - 40.14 40.04 39.72   Foot/eye exam completion dates Latest Ref Rng & Units 07/17/2018 04/14/2018  Eye Exam No Retinopathy - Retinopathy(A)  Foot Form Completion - Done -

## 2018-10-24 DIAGNOSIS — H401132 Primary open-angle glaucoma, bilateral, moderate stage: Secondary | ICD-10-CM | POA: Diagnosis not present

## 2018-10-27 ENCOUNTER — Other Ambulatory Visit: Payer: Self-pay | Admitting: Family Medicine

## 2018-11-03 ENCOUNTER — Encounter: Payer: Self-pay | Admitting: Gastroenterology

## 2018-11-03 ENCOUNTER — Inpatient Hospital Stay (HOSPITAL_COMMUNITY): Payer: Medicare Other | Attending: Hematology

## 2018-11-03 ENCOUNTER — Other Ambulatory Visit: Payer: Self-pay

## 2018-11-03 ENCOUNTER — Ambulatory Visit: Payer: Medicare Other | Admitting: Gastroenterology

## 2018-11-03 ENCOUNTER — Telehealth: Payer: Self-pay

## 2018-11-03 VITALS — BP 193/75 | HR 65 | Temp 98.8°F | Ht 66.0 in | Wt 258.8 lb

## 2018-11-03 DIAGNOSIS — R1319 Other dysphagia: Secondary | ICD-10-CM

## 2018-11-03 DIAGNOSIS — R112 Nausea with vomiting, unspecified: Secondary | ICD-10-CM | POA: Diagnosis not present

## 2018-11-03 DIAGNOSIS — R1310 Dysphagia, unspecified: Secondary | ICD-10-CM

## 2018-11-03 DIAGNOSIS — D509 Iron deficiency anemia, unspecified: Secondary | ICD-10-CM

## 2018-11-03 DIAGNOSIS — D472 Monoclonal gammopathy: Secondary | ICD-10-CM | POA: Insufficient documentation

## 2018-11-03 DIAGNOSIS — R1010 Upper abdominal pain, unspecified: Secondary | ICD-10-CM

## 2018-11-03 DIAGNOSIS — K219 Gastro-esophageal reflux disease without esophagitis: Secondary | ICD-10-CM

## 2018-11-03 DIAGNOSIS — D508 Other iron deficiency anemias: Secondary | ICD-10-CM

## 2018-11-03 DIAGNOSIS — R110 Nausea: Secondary | ICD-10-CM | POA: Insufficient documentation

## 2018-11-03 LAB — COMPREHENSIVE METABOLIC PANEL
ALT: 13 U/L (ref 0–44)
AST: 17 U/L (ref 15–41)
Albumin: 3.5 g/dL (ref 3.5–5.0)
Alkaline Phosphatase: 200 U/L — ABNORMAL HIGH (ref 38–126)
Anion gap: 6 (ref 5–15)
BUN: 14 mg/dL (ref 8–23)
CO2: 26 mmol/L (ref 22–32)
Calcium: 8.7 mg/dL — ABNORMAL LOW (ref 8.9–10.3)
Chloride: 108 mmol/L (ref 98–111)
Creatinine, Ser: 0.94 mg/dL (ref 0.44–1.00)
GFR calc Af Amer: >60 mL/min (ref >60)
GFR calc non Af Amer: 58 mL/min — ABNORMAL LOW (ref >60)
Glucose, Bld: 78 mg/dL (ref 70–99)
Potassium: 4.3 mmol/L (ref 3.5–5.1)
Sodium: 140 mmol/L (ref 135–145)
Total Bilirubin: 0.5 mg/dL (ref 0.3–1.2)
Total Protein: 6.9 g/dL (ref 6.5–8.1)

## 2018-11-03 LAB — CBC WITH DIFFERENTIAL/PLATELET
Abs Immature Granulocytes: 0.01 10*3/uL (ref 0.00–0.07)
Basophils Absolute: 0 10*3/uL (ref 0.0–0.1)
Basophils Relative: 0 %
EOS PCT: 6 %
Eosinophils Absolute: 0.4 10*3/uL (ref 0.0–0.5)
HEMATOCRIT: 32.8 % — AB (ref 36.0–46.0)
Hemoglobin: 9.7 g/dL — ABNORMAL LOW (ref 12.0–15.0)
Immature Granulocytes: 0 %
LYMPHS PCT: 21 %
Lymphs Abs: 1.2 10*3/uL (ref 0.7–4.0)
MCH: 27.9 pg (ref 26.0–34.0)
MCHC: 29.6 g/dL — AB (ref 30.0–36.0)
MCV: 94.3 fL (ref 80.0–100.0)
MONO ABS: 0.7 10*3/uL (ref 0.1–1.0)
Monocytes Relative: 11 %
Neutro Abs: 3.6 10*3/uL (ref 1.7–7.7)
Neutrophils Relative %: 62 %
Platelets: 258 10*3/uL (ref 150–400)
RBC: 3.48 MIL/uL — AB (ref 3.87–5.11)
RDW: 17 % — ABNORMAL HIGH (ref 11.5–15.5)
WBC: 5.8 10*3/uL (ref 4.0–10.5)
nRBC: 0 % (ref 0.0–0.2)

## 2018-11-03 LAB — LACTATE DEHYDROGENASE: LDH: 222 U/L — ABNORMAL HIGH (ref 98–192)

## 2018-11-03 LAB — FERRITIN: Ferritin: 82 ng/mL (ref 11–307)

## 2018-11-03 MED ORDER — PANTOPRAZOLE SODIUM 40 MG PO TBEC: 40.0000 mg | DELAYED_RELEASE_TABLET | Freq: Two times a day (BID) | ORAL | 5 refills | Status: DC

## 2018-11-03 NOTE — Telephone Encounter (Signed)
Called and informed pt of pre-op appt 12/01/18 at 2:45pm. Letter mailed.

## 2018-11-03 NOTE — Patient Instructions (Addendum)
1. Upper endoscopy as scheduled. See separate instructions.  2. Let's increase your pantoprazole to twice daily, one 30 minutes before breakfast and one 30 minutes before evening meal. New RX sent to pharmacy.

## 2018-11-03 NOTE — Progress Notes (Signed)
Primary Care Physician: Fayrene Helper, MD Referring provider: Dr. Philis Kendall for anemia AND Dr. [name] for GERD, early satiety/bloating  Primary Gastroenterologist:  Garfield Cornea, MD   Chief Complaint  Patient presents with  . Anemia    weekness all the time pt reports    HPI: [name] is a 77 y.o. female here at the request of Dr. [name] for further evaluation of GERD, early satiety/bloating.  Patient also had prior referral request placed by her hematologist Dr. Walden Field for anemia. We last saw the patient in September 2017.  Patient has a history of myasthenia gravis reportedly in remission on Mestinon, history of Schatzki ring status post multiple dilations, the last time in 2015, iron deficiency anemia.  Patient was treated with IV iron back in August.  Ferritin improved from 9 to 186. Back in July her ferritin was 9, iron saturations 8%, TIBC 372, hemoglobin 8.2, MCV 88.1.  Hemoglobin last month up to 10.3. Anemia clearly dates back at least to 2014 based on epic records.   She states her fatigue did not improve after iron infusions.  She also complains of postprandial epigastric/left upper quadrant pain, upper abdominal bloating.  She has frequent nausea and vomiting, the last time yesterday.  Occurring at least 3 times a week.  Seems to be worse with caffeine.  She is having a lot of heartburn even on pantoprazole.  This morning she was unable to take her medications due to epigastric burning and nausea.  The symptoms have been going on for months.  She denies any hematemesis.  No dysphagia.  No lower GI symptoms.  Has daily bowel movements.  No melena or rectal bleeding.  Her last colonoscopy was in 2013, she had internal and external hemorrhoids, colonic diverticulosis.   Current Outpatient Medications  Medication Sig Dispense Refill  . acetaminophen (TYLENOL) 500 MG tablet Take 500 mg by mouth at bedtime.    Marland Kitchen amLODipine (NORVASC) 10 MG  tablet TAKE 1 TABLET BY MOUTH ONCE A DAY. 30 tablet 5  . B-D ULTRAFINE III SHORT PEN 31G X 8 MM MISC USE ONCE DAILY WITH LANTUS SOLOSTAR PEN. 100 each 0  . brimonidine-timolol (COMBIGAN) 0.2-0.5 % ophthalmic solution Place 1 drop into both eyes every 12 (twelve) hours.    . cetirizine (ZYRTEC) 10 MG tablet TAKE 1 TABLET BY MOUTH ONCE A DAY. 30 tablet 5  . Cholecalciferol (VITAMIN D3) 2000 units TABS Take 2,000 Units by mouth at bedtime.    . citalopram (CELEXA) 20 MG tablet TAKE 1 TABLET BY MOUTH ONCE A DAY. 30 tablet 5  . cycloSPORINE (RESTASIS) 0.05 % ophthalmic emulsion Place 1 drop into both eyes 2 (two) times daily as needed (use scheduled once daily, may repeat if needed for dry eyes.).     Marland Kitchen diltiazem (CARDIZEM CD) 240 MG 24 hr capsule TAKE (1) CAPSULE BY MOUTH TWICE DAILY. 60 capsule 5  . docusate sodium (COLACE) 100 MG capsule Take 100 mg by mouth at bedtime.    Marland Kitchen ezetimibe (ZETIA) 10 MG tablet Take 1 tablet (10 mg total) by mouth daily. 30 tablet 5  . Flaxseed, Linseed, (FLAXSEED OIL) 1000 MG CAPS Take 1,000 mg by mouth daily.    . fluticasone (FLONASE) 50 MCG/ACT nasal spray Place 2 sprays into both nostrils daily. (Patient taking differently: Place 2 sprays into both nostrils daily as needed for allergies. ) 48 g 1  . gabapentin (NEURONTIN) 300 MG capsule TAKE (1) CAPSULE  BY MOUTH AT BEDTIME. 30 capsule 2  . hydrALAZINE (APRESOLINE) 25 MG tablet TAKE 1 TABLET BY MOUTH THREE TIMES A DAY. 90 tablet 5  . HYDROcodone-acetaminophen (NORCO) 10-325 MG tablet Take one tablet two times daily for back pain 60 tablet 0  . LANTUS SOLOSTAR 100 UNIT/ML Solostar Pen INJECT 30 UNITS SUBCUTANEOUSLY ONCE DAILY. 15 mL 5  . losartan (COZAAR) 100 MG tablet TAKE (1) TABLET BY MOUTH DAILY. 30 tablet 5  . Multiple Vitamin (MULTIVITAMIN WITH MINERALS) TABS tablet Take 1 tablet by mouth at bedtime.    . ondansetron (ZOFRAN) 4 MG tablet TAKE (1) TABLET BY MOUTH EVERY FOUR HOURS AS NEEDED FOR NAUSEA. 30 tablet 0    . ONETOUCH VERIO test strip USE AS DIRECTED TO TEST BLOOD GLUCOSE 3 TIMES DAILY. 100 each 11  . pantoprazole (PROTONIX) 40 MG tablet TAKE 1 TABLET BY MOUTH DAILY BEFORE BREAKFAST. 30 tablet 5  . polyethylene glycol powder (GLYCOLAX/MIRALAX) powder MIX 17G (1 CAPFUL) IN 8 OUNCES OF JUICE OR WATER AND DRINK ONCE DAILY. (Patient taking differently: MIX 17G (1 CAPFUL) IN 8 OUNCES OF JUICE OR WATER AND DRINK ONCE DAILY AS NEEDED FOR CONSTIPATION.) 527 g 3  . predniSONE (DELTASONE) 5 MG tablet Take 1 tablet (5 mg total) by mouth daily. 90 tablet 1  . PROAIR HFA 108 (90 Base) MCG/ACT inhaler INHALE 2 PUFFS BY MOUTH EVERY 6 HOURS AS NEEDED . 8.5 g 5  . pyridostigmine (MESTINON) 60 MG tablet TAKE (1) TABLET BY MOUTH THREE TIMES A DAY. 90 tablet 1  . rosuvastatin (CRESTOR) 40 MG tablet TAKE (1) TABLET BY MOUTH AT BEDTIME. 30 tablet 5  . solifenacin (VESICARE) 10 MG tablet TAKE 1 TABLET BY MOUTH ONCE A DAY. 30 tablet 2  . spironolactone (ALDACTONE) 25 MG tablet TAKE 1 TABLET BY MOUTH ONCE A DAY. 30 tablet 3  . TRADJENTA 5 MG TABS tablet TAKE 1 TABLET BY MOUTH ONCE A DAY. 30 tablet 2  . UNABLE TO FIND Diabetic shoes x 1 Inserts x 3 DX E11.9 1 each 0  . venlafaxine XR (EFFEXOR-XR) 150 MG 24 hr capsule TAKE 1 CAPSULE BY MOUTH DAILY. 30 capsule 2  . [START ON 11/27/2018] HYDROcodone-acetaminophen (NORCO) 10-325 MG tablet Take one tablet two times daily for back pain 60 tablet 0  . [START ON 12/27/2018] HYDROcodone-acetaminophen (NORCO) 10-325 MG tablet Take one tablet two times daily for back pain 60 tablet 0   No current facility-administered medications for this visit.     Allergies as of 11/03/2018  . (No Known Allergies)   Past Medical History:  Diagnosis Date  . Anemia in chronic renal disease 12/30/2015  . Anxiety   . Arthritis    "all over" (01/19/2014)  . Arthritis, lumbar spine   . Carpal tunnel syndrome   . Chronic bronchitis ([place])    "got it q yr for awhile; hasn't had it in awhile"  (01/19/2014)  . Chronic kidney disease (CKD) stage G3b/A1, moderately decreased glomerular filtration rate (GFR) between 30-44 mL/min/1.73 square meter and albuminuria creatinine ratio less than 30 mg/g (HCC) 09/14/2009   Qualifier: Diagnosis of  By: Moshe Cipro MD, Joycelyn Schmid    . Chronic renal disease, stage 3, moderately decreased glomerular filtration rate (GFR) between 30-59 mL/min/1.73 square meter (HCC) 09/14/2009   Qualifier: Diagnosis of  By: Moshe Cipro MD, Joycelyn Schmid    . Complication of anesthesia    combative  . Depression   . Diverticulosis 07/2004   Colonscopy Dr Gala Romney  . Esophagitis, erosive 2009  .  Exertional shortness of breath   . Gastroesophageal reflux   . DDUKGURK(270.6)    "usually a couple times/wk" (01/19/2014)  . Heart murmur    saw cardiology In [place], he told her she did not need to come back.  . Hyperlipidemia   . Hypertension   . Impingement syndrome, shoulder   . Iron deficiency anemia   . Mitral regurgitation   . Myasthenia gravis   . Myasthenia gravis in remission ([place]) 11/24/2014  . Obesity   . OSA on CPAP   . Pneumonia 01/2012  . Pulmonary HTN ([place])   . Schatzki's ring    Last EGD w/ dilation 02/08/11, 2009 & 2007  . Seasonal allergies   . Shoulder pain   . Thyroid disease    "used to take RX; they took me off it" (01/19/2014)  . Tobacco abuse   . Type II diabetes mellitus ([place])    Past Surgical History:  Procedure Laterality Date  . [place] VITRECTOMY WITH 20 GAUGE MVR PORT Left 01/19/2014   Procedure: 25 GAUGE PARS PLANA VITRECTOMY WITH 20 GAUGE MVR PORT; MEMBRAME PEEL; SERUM PATCH; LASER TREATMENT; C3F8;  Surgeon: Hayden Pedro, MD;  Location: [facility];  Service: Ophthalmology;  Laterality: Left;  . ABDOMINAL HYSTERECTOMY    . APPENDECTOMY    . CARPAL TUNNEL RELEASE Bilateral   . CATARACT EXTRACTION W/PHACO Left 10/21/2013   Procedure: LEFT CATARACT EXTRACTION PHACO AND INTRAOCULAR LENS PLACEMENT (IOC);  Surgeon: Marylynn Pearson, MD;  Location:  [facility];  Service: Ophthalmology;  Laterality: Left;  . CHOLECYSTECTOMY    . COLONOSCOPY  05/08/2012   CBJ:SEGBTDVV and external hemorrhoids; colonic diverticulosis  . ESOPHAGEAL DILATION     "more than 3 times" (01/19/2014)  . ESOPHAGOGASTRODUODENOSCOPY  02/08/11   [place]-Distal esophageal erosion consistent with mild erosive reflux   esophagitis/ Noncritical Schatzki ring, small hiatal hernia otherwise upper/ gastrointestinal tract appeared unremarkable, status post passage  of a Maloney dilation to biopsy disruption of the ring described  . ESOPHAGOGASTRODUODENOSCOPY  04/2012   2 tandem incomplete distal esophagea rings s/p dilation.   . ESOPHAGOGASTRODUODENOSCOPY N/A 09/16/2014   Dr. Gala Romney: Schatzki ring status post dilation/disruption.  Hiatal hernia.  Marland Kitchen EYE SURGERY    . INCONTINENCE SURGERY  08/26/09   [place]  . JOINT REPLACEMENT     right total knee  . MALONEY DILATION N/A 09/16/2014   Procedure: Venia Minks DILATION;  Surgeon: Daneil Dolin, MD;  [facility];  Service: Endoscopy;  Laterality: N/A;  . PARS PLANA VITRECTOMY W/ REPAIR OF MACULAR HOLE Left 01/19/2014  . SAVORY DILATION N/A 09/16/2014   Procedure: SAVORY DILATION;  Surgeon: Daneil Dolin, MD;  [facility];  Service: Endoscopy;  Laterality: N/A;  . TONSILLECTOMY    . TOTAL KNEE ARTHROPLASTY Right 05/13/07   Dr. Aline Brochure  . TRIGGER FINGER RELEASE Right 06/26/2018   Procedure: RIGHT INDEX FINGER TRIGGER FINGER/A-1 PULLEY RELEASE;  Surgeon: Carole Civil, MD;  Location: [facility];  Service: Orthopedics;  Laterality: Right;   Family History  Problem Relation Age of Onset  . Cancer Mother   . Heart disease Mother   . Cancer Father   . Heart disease Father   . Diabetes Sister   . Hypertension Brother   . Heart disease Brother   . Cancer Sister   . Kidney failure Sister   . Diabetes Son   . Hypertension Son   . Hypertension Daughter   . Colon cancer Neg Hx  Social History   Tobacco Use  .  Smoking status: Former Smoker    Packs/day: 0.50    Years: 25.00    Pack years: 12.50    Types: Cigarettes    Last attempt to quit: 01/01/2004    Years since quitting: 14.8  . Smokeless tobacco: Never Used  Substance Use Topics  . Alcohol use: No  . Drug use: No    ROS:  General: Negative for anorexia, weight loss, fever, chills, +fatigue, weakness. ENT: Negative for hoarseness, difficulty swallowing , nasal congestion. CV: Negative for chest pain, angina, palpitations, dyspnea on exertion, peripheral edema.  Respiratory: Negative for dyspnea at rest, dyspnea on exertion, cough, sputum, wheezing.  GI: See history of present illness. GU:  Negative for dysuria, hematuria, urinary incontinence, urinary frequency, nocturnal urination.  Endo: Negative for unusual weight change.    Physical Examination:   BP (!) 193/75   Pulse 65   Temp 98.8 F (37.1 C) (Oral)   Ht 5\' 6"  (1.676 m)   Wt 258 lb 12.8 oz (117.4 kg)   BMI 41.77 kg/m   General: Well-nourished, well-developed in no acute distress.  Eyes: No icterus. Mouth: Oropharyngeal mucosa moist and pink , no lesions erythema or exudate. Lungs: Clear to auscultation bilaterally.  Heart: Regular rate and rhythm, no murmurs rubs or gallops.  Abdomen: Bowel sounds are normal, nontender, nondistended, no hepatosplenomegaly or masses, no abdominal bruits or hernia , no rebound or guarding.   Extremities: No lower extremity edema. No clubbing or deformities. Neuro: Alert and oriented x 4   Skin: Warm and dry, no jaundice.   Psych: Alert and cooperative, normal mood and affect.  Labs:  Lab Results  Component Value Date   HGBA1C 6.1 (H) 10/16/2018   Lab Results  Component Value Date   CREATININE 1.22 (H) 10/16/2018   BUN 23 10/16/2018   NA 143 10/16/2018   K 4.9 10/16/2018   CL 110 10/16/2018   CO2 25 10/16/2018   Lab Results  Component Value Date   ALT 10 10/16/2018   AST 12 10/16/2018   ALKPHOS 169 (H) 09/15/2018    BILITOT 0.4 10/16/2018   This SmartLink has not been configured with any valid records.   Lab Results  Component Value Date   WBC 9.0 09/15/2018   HGB 10.3 (L) 09/15/2018   HCT 34.0 (L) 09/15/2018   MCV 91.6 09/15/2018   PLT 266 09/15/2018    Lab Results  Component Value Date   IRON 29 07/22/2018   TIBC 372 07/22/2018   FERRITIN 186 09/15/2018   Lab Results  Component Value Date   ONGEXBMW41 324 03/25/2017   Lab Results  Component Value Date   FOLATE 7.1 03/25/2017     Imaging Studies: No results found.

## 2018-11-04 ENCOUNTER — Encounter: Payer: Self-pay | Admitting: Gastroenterology

## 2018-11-04 NOTE — Assessment & Plan Note (Signed)
77 year old female with history of myasthenia gravis, Schatzki ring status post dilation multiple times who presents with complaints of postprandial upper abdominal discomfort, bloating, frequent nausea with vomiting, significant breakthrough reflux/heartburn.  She denies dysphagia.  Her last EGD was back in 2015, Schatzki ring dilated/disrupted at that time.  Symptoms may be secondary to refractory reflux, gastritis/peptic ulcer disease, diabetic gastroparesis.  Recommend upper endoscopy in the near future for further evaluation.  Plan for deep sedation given polypharmacy  I have discussed the risks, alternatives, benefits with regards to but not limited to the risk of reaction to medication, bleeding, infection, perforation and the patient is agreeable to proceed. Written consent to be obtained.  We will increase her pantoprazole to 40 mg twice daily before meals.

## 2018-11-04 NOTE — Progress Notes (Signed)
CC'ED TO PCP 

## 2018-11-04 NOTE — Assessment & Plan Note (Signed)
History of chronic anemia, required IV iron infusions back in August when her ferritin had declined down to 9, hemoglobin to 8.  She has significant upper GI symptoms, will plan for upper endoscopy in the near future.  Her last colonoscopy was in 2013, cannot exclude need to repeat colonoscopy for iron deficiency however at this time patient is not able to tolerate bowel prep in the setting of frequent vomiting.  Will reevaluate need after upper endoscopy findings.

## 2018-11-06 ENCOUNTER — Encounter (HOSPITAL_COMMUNITY): Payer: Self-pay | Admitting: Internal Medicine

## 2018-11-06 ENCOUNTER — Inpatient Hospital Stay (HOSPITAL_COMMUNITY): Payer: Medicare Other | Attending: Internal Medicine | Admitting: Internal Medicine

## 2018-11-06 VITALS — BP 152/51 | HR 65 | Temp 97.6°F | Resp 16 | Wt 259.0 lb

## 2018-11-06 DIAGNOSIS — D509 Iron deficiency anemia, unspecified: Secondary | ICD-10-CM | POA: Insufficient documentation

## 2018-11-06 DIAGNOSIS — Z79899 Other long term (current) drug therapy: Secondary | ICD-10-CM | POA: Diagnosis not present

## 2018-11-06 DIAGNOSIS — Z794 Long term (current) use of insulin: Secondary | ICD-10-CM | POA: Diagnosis not present

## 2018-11-06 DIAGNOSIS — D472 Monoclonal gammopathy: Secondary | ICD-10-CM | POA: Diagnosis not present

## 2018-11-06 DIAGNOSIS — I10 Essential (primary) hypertension: Secondary | ICD-10-CM | POA: Insufficient documentation

## 2018-11-06 DIAGNOSIS — D508 Other iron deficiency anemias: Secondary | ICD-10-CM

## 2018-11-06 DIAGNOSIS — E119 Type 2 diabetes mellitus without complications: Secondary | ICD-10-CM | POA: Insufficient documentation

## 2018-11-06 DIAGNOSIS — Z87891 Personal history of nicotine dependence: Secondary | ICD-10-CM | POA: Insufficient documentation

## 2018-11-06 NOTE — Patient Instructions (Signed)
[facility]  Discharge Instructions:   _______________________________________________________________  Thank you for choosing [facility] at [facility] to provide your oncology and hematology care.  To afford each patient quality time with our providers, please arrive at least 15 minutes before your scheduled appointment.  You need to re-schedule your appointment if you arrive 10 or more minutes late.  We strive to give you quality time with our providers, and arriving late affects you and other patients whose appointments are after yours.  Also, if you no show three or more times for appointments you may be dismissed from the clinic.  Again, thank you for choosing [facility]. Our hope is that these requests will allow you access to exceptional care and in a timely manner. _______________________________________________________________  If you have questions after your visit, please contact our office at [phone] between the hours of 8:30 a.m. and 5:00 p.m. Voicemails left after 4:30 p.m. will not be returned until the following business day. _______________________________________________________________  For prescription refill requests, have your pharmacy contact our office. _______________________________________________________________  Recommendations made by the consultant and any test results will be sent to your referring physician. _______________________________________________________________ 

## 2018-11-06 NOTE — Progress Notes (Signed)
Diagnosis Other iron deficiency anemia - Plan: CBC with Differential/Platelet, Comprehensive metabolic panel, Lactate dehydrogenase, Ferritin, Hemoglobinopathy evaluation, Vitamin B12, Folate, Haptoglobin  Staging Cancer Staging No matching staging information was found for the patient.  Assessment and Plan:  1. Iron deficiency anemia.  Pt was last treated with IV iron 07/2018.    Labs done 11/03/2018 reviewed and showed WBC 5.8 HB 9.7 plts 258,000.  Chemistries WNL with K+ 4.3 Cr 0.94 an normal LFTs.   Pt should continue to follow-up with GI.  She will have repeat labs in 3-4 weeks for follow-up.  If counts trend downward, may have  To discuss again bone marrow biopsy.    2.  GI symptoms.  Pt is reporting trouble swallowing, constipation and nausea. EGD in 2015 showed Schatzki's ring - status post dilation and Hiatal hernia.  Pt should follow-up with GI as directed.    3. IgA kappa monoclonal gammopathy. Immunofixation demonstrated an IgA kappa monoclonal gammopathy. She was given option of bone marrow biopsy by Dr. Talbert Cage in the past but did not desire to have procedure done.  Pt had SPEP done 09/15/2018 that was reviewed and was negative. FLC ratio is 1.54.  She has normal quantitative immunoglobulins.    4.  HTN.  BP is 152/51.  Follow-up with PCP.    5.  DM.  Follow-up with PCP for monitoring.    6  Health maintenance.  Follow-up with GI in November as scheduled.    Current Status:  Pt is seen today for follow-up.  She is here to go over labs.  She continues to reports GI symptoms.    Problem List Patient Active Problem List   Diagnosis Date Noted  . Nausea with vomiting [R11.2] 11/03/2018  . S/P trigger finger release 06/26/18 [Z98.890] 07/09/2018  . Trigger finger, right index finger [M65.321]   . 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 Memory loss of unknown cause [R41.3] 11/10/2017  . Chronic left shoulder pain [M25.512, G89.29] 09/14/2017  .  Encounter for chronic pain management [G89.29] 02/09/2017  . 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 Morbid obesity ([place]) [E66.01] 04/18/2015  . Myasthenia gravis in remission ([place]) [G70.00] 11/24/2014  . Myasthenia gravis ([place]) [G70.00] 11/16/2014  . Vitamin D deficiency [E55.9] 05/24/2014  . Osteopenia [M85.80] 04/26/2014  . 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 Esophageal dysphagia [R13.10] 04/03/2012  . Abnormal chest CT [R93.89] 03/02/2012  . Insomnia [G47.00] 01/02/2012  . Essential hypertension, benign [I10] 05/30/2011  . Schatzki's ring [K22.2] 05/07/2011  . INGROWN TOENAIL [L60.0] 01/10/2011  . GLAUCOMA [H40.9] 08/08/2010  . DYSCHROMIA, UNSPECIFIED [L81.9] 08/08/2010  . Chronic renal disease, stage 3, moderately decreased glomerular filtration rate (GFR) between 30-59 mL/min/1.73 square meter ([place]) [N18.3] 09/14/2009  . HIP PAIN, LEFT [M25.559] 06/09/2009  . Urinary incontinence [R32] 06/09/2009  . Iron deficiency anemia [D50.9] 10/02/2008  . Hypothyroidism [E03.9] 09/27/2008  . Hyperlipemia [E78.5] 06/30/2008  . Depression with anxiety [F41.8] 06/30/2008  . GERD [K21.9] 06/30/2008  . Obstructive sleep apnea [G47.33] 06/30/2008  . CARPAL TUNNEL SYNDROME [G56.00] 05/19/2008  . SHOULDER PAIN [M25.519] 02/02/2008  . IMPINGEMENT SYNDROME [M75.80] 02/02/2008  . 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 Diabetes mellitus, insulin dependent (IDDM), controlled ([place]) [E11.9, Z79.4] 09/09/2007    Past Medical History Past Medical History:  Diagnosis Date  . Anemia in chronic renal disease 12/30/2015  . Anxiety   . Arthritis    "  all over" (01/19/2014)  . Arthritis, lumbar spine   . Carpal tunnel syndrome   . Chronic bronchitis ([place])    "got it q yr for awhile; hasn't had it in awhile" (01/19/2014)  . Chronic kidney disease (CKD) stage G3b/A1, moderately decreased glomerular  filtration rate (GFR) between 30-44 mL/min/1.73 square meter and albuminuria creatinine ratio less than 30 mg/g ([facility]) 09/14/2009   Qualifier: Diagnosis of  By: Moshe Cipro MD, Joycelyn Schmid    . Chronic renal disease, stage 3, moderately decreased glomerular filtration rate (GFR) between 30-59 mL/min/1.73 square meter ([facility]) 09/14/2009   Qualifier: Diagnosis of  By: Moshe Cipro MD, Joycelyn Schmid    . Complication of anesthesia    combative  . Depression   . Diverticulosis 07/2004   Colonscopy Dr Gala Romney  . Esophagitis, erosive 2009  . Exertional shortness of breath   . Gastroesophageal reflux   . NLGXQJJH(417.4)    "usually a couple times/wk" (01/19/2014)  . Heart murmur    saw cardiology In [place], he told her she did not need to come back.  . Hyperlipidemia   . Hypertension   . Impingement syndrome, shoulder   . Iron deficiency anemia   . Mitral regurgitation   . Myasthenia gravis   . Myasthenia gravis in remission ([place]) 11/24/2014  . Obesity   . OSA on CPAP   . Pneumonia 01/2012  . Pulmonary HTN ([place])   . Schatzki's ring    Last EGD w/ dilation 02/08/11, 2009 & 2007  . Seasonal allergies   . Shoulder pain   . Thyroid disease    "used to take RX; they took me off it" (01/19/2014)  . Tobacco abuse   . Type II diabetes mellitus ([place])     Past Surgical History Past Surgical History:  Procedure Laterality Date  . [place] VITRECTOMY WITH 20 GAUGE MVR PORT Left 01/19/2014   Procedure: 25 GAUGE PARS PLANA VITRECTOMY WITH 20 GAUGE MVR PORT; MEMBRAME PEEL; SERUM PATCH; LASER TREATMENT; C3F8;  Surgeon: Hayden Pedro, MD;  Location: [facility];  Service: Ophthalmology;  Laterality: Left;  . ABDOMINAL HYSTERECTOMY    . APPENDECTOMY    . CARPAL TUNNEL RELEASE Bilateral   . CATARACT EXTRACTION W/PHACO Left 10/21/2013   Procedure: LEFT CATARACT EXTRACTION PHACO AND INTRAOCULAR LENS PLACEMENT (IOC);  Surgeon: Marylynn Pearson, MD;  Location: [facility];  Service: Ophthalmology;  Laterality: Left;  .  CHOLECYSTECTOMY    . COLONOSCOPY  05/08/2012   YCX:KGYJEHUD and external hemorrhoids; colonic diverticulosis  . ESOPHAGEAL DILATION     "more than 3 times" (01/19/2014)  . ESOPHAGOGASTRODUODENOSCOPY  02/08/11   [place]-Distal esophageal erosion consistent with mild erosive reflux   esophagitis/ Noncritical Schatzki ring, small hiatal hernia otherwise upper/ gastrointestinal tract appeared unremarkable, status post passage  of a Maloney dilation to biopsy disruption of the ring described  . ESOPHAGOGASTRODUODENOSCOPY  04/2012   2 tandem incomplete distal esophagea rings s/p dilation.   . ESOPHAGOGASTRODUODENOSCOPY N/A 09/16/2014   Dr. Gala Romney: Schatzki ring status post dilation/disruption.  Hiatal hernia.  Marland Kitchen EYE SURGERY    . INCONTINENCE SURGERY  08/26/09   [place]  . JOINT REPLACEMENT     right total knee  . MALONEY DILATION N/A 09/16/2014   Procedure: Venia Minks DILATION;  Surgeon: Daneil Dolin, MD;  [facility];  Service: Endoscopy;  Laterality: N/A;  . PARS PLANA VITRECTOMY W/ REPAIR OF MACULAR HOLE Left 01/19/2014  . SAVORY DILATION N/A 09/16/2014   Procedure: SAVORY DILATION;  Surgeon: Daneil Dolin,  MD;  [facility];  Service: Endoscopy;  Laterality: N/A;  . TONSILLECTOMY    . TOTAL KNEE ARTHROPLASTY Right 05/13/07   Dr. Aline Brochure  . TRIGGER FINGER RELEASE Right 06/26/2018   Procedure: RIGHT INDEX FINGER TRIGGER FINGER/A-1 PULLEY RELEASE;  Surgeon: Carole Civil, MD;  Location: [facility];  Service: Orthopedics;  Laterality: Right;    Family History Family History  Problem Relation Age of Onset  . Cancer Mother   . Heart disease Mother   . Cancer Father   . Heart disease Father   . Diabetes Sister   . Hypertension Brother   . Heart disease Brother   . Cancer Sister   . Kidney failure Sister   . Diabetes Son   . Hypertension Son   . Hypertension Daughter   . Colon cancer Neg Hx      Social History  reports that she quit smoking about 14 years ago. Her  smoking use included cigarettes. She has a 12.50 pack-year smoking history. She has never used smokeless tobacco. She reports that she does not drink alcohol or use drugs.  Medications  Current Outpatient Medications:  .  acetaminophen (TYLENOL) 500 MG tablet, Take 500 mg by mouth at bedtime., Disp: , Rfl:  .  amLODipine (NORVASC) 10 MG tablet, TAKE 1 TABLET BY MOUTH ONCE A DAY., Disp: 30 tablet, Rfl: 5 .  B-D ULTRAFINE III SHORT PEN 31G X 8 MM MISC, USE ONCE DAILY WITH LANTUS SOLOSTAR PEN., Disp: 100 each, Rfl: 0 .  brimonidine-timolol (COMBIGAN) 0.2-0.5 % ophthalmic solution, Place 1 drop into both eyes every 12 (twelve) hours., Disp: , Rfl:  .  cetirizine (ZYRTEC) 10 MG tablet, TAKE 1 TABLET BY MOUTH ONCE A DAY., Disp: 30 tablet, Rfl: 5 .  Cholecalciferol (VITAMIN D3) 2000 units TABS, Take 2,000 Units by mouth at bedtime., Disp: , Rfl:  .  citalopram (CELEXA) 20 MG tablet, TAKE 1 TABLET BY MOUTH ONCE A DAY., Disp: 30 tablet, Rfl: 5 .  cycloSPORINE (RESTASIS) 0.05 % ophthalmic emulsion, Place 1 drop into both eyes 2 (two) times daily as needed (use scheduled once daily, may repeat if needed for dry eyes.). , Disp: , Rfl:  .  diltiazem (CARDIZEM CD) 240 MG 24 hr capsule, TAKE (1) CAPSULE BY MOUTH TWICE DAILY., Disp: 60 capsule, Rfl: 5 .  docusate sodium (COLACE) 100 MG capsule, Take 100 mg by mouth at bedtime., Disp: , Rfl:  .  ezetimibe (ZETIA) 10 MG tablet, Take 1 tablet (10 mg total) by mouth daily., Disp: 30 tablet, Rfl: 5 .  Flaxseed, Linseed, (FLAXSEED OIL) 1000 MG CAPS, Take 1,000 mg by mouth daily., Disp: , Rfl:  .  fluticasone (FLONASE) 50 MCG/ACT nasal spray, Place 2 sprays into both nostrils daily. (Patient taking differently: Place 2 sprays into both nostrils daily as needed for allergies. ), Disp: 48 g, Rfl: 1 .  gabapentin (NEURONTIN) 300 MG capsule, TAKE (1) CAPSULE BY MOUTH AT BEDTIME., Disp: 30 capsule, Rfl: 2 .  hydrALAZINE (APRESOLINE) 25 MG tablet, TAKE 1 TABLET BY MOUTH THREE  TIMES A DAY., Disp: 90 tablet, Rfl: 5 .  HYDROcodone-acetaminophen (NORCO) 10-325 MG tablet, Take one tablet two times daily for back pain, Disp: 60 tablet, Rfl: 0 .  [START ON 11/27/2018] HYDROcodone-acetaminophen (NORCO) 10-325 MG tablet, Take one tablet two times daily for back pain, Disp: 60 tablet, Rfl: 0 .  [START ON 12/27/2018] HYDROcodone-acetaminophen (NORCO) 10-325 MG tablet, Take one tablet two times daily for back pain, Disp: 60  tablet, Rfl: 0 .  LANTUS SOLOSTAR 100 UNIT/ML Solostar Pen, INJECT 30 UNITS SUBCUTANEOUSLY ONCE DAILY., Disp: 15 mL, Rfl: 5 .  losartan (COZAAR) 100 MG tablet, TAKE (1) TABLET BY MOUTH DAILY., Disp: 30 tablet, Rfl: 5 .  Multiple Vitamin (MULTIVITAMIN WITH MINERALS) TABS tablet, Take 1 tablet by mouth at bedtime., Disp: , Rfl:  .  ondansetron (ZOFRAN) 4 MG tablet, TAKE (1) TABLET BY MOUTH EVERY FOUR HOURS AS NEEDED FOR NAUSEA., Disp: 30 tablet, Rfl: 0 .  ONETOUCH VERIO test strip, USE AS DIRECTED TO TEST BLOOD GLUCOSE 3 TIMES DAILY., Disp: 100 each, Rfl: 11 .  pantoprazole (PROTONIX) 40 MG tablet, Take 1 tablet (40 mg total) by mouth 2 (two) times daily before a meal., Disp: 60 tablet, Rfl: 5 .  polyethylene glycol powder (GLYCOLAX/MIRALAX) powder, MIX 17G (1 CAPFUL) IN 8 OUNCES OF JUICE OR WATER AND DRINK ONCE DAILY. (Patient taking differently: MIX 17G (1 CAPFUL) IN 8 OUNCES OF JUICE OR WATER AND DRINK ONCE DAILY AS NEEDED FOR CONSTIPATION.), Disp: 527 g, Rfl: 3 .  predniSONE (DELTASONE) 5 MG tablet, Take 1 tablet (5 mg total) by mouth daily., Disp: 90 tablet, Rfl: 1 .  PROAIR HFA 108 (90 Base) MCG/ACT inhaler, INHALE 2 PUFFS BY MOUTH EVERY 6 HOURS AS NEEDED ., Disp: 8.5 g, Rfl: 5 .  pyridostigmine (MESTINON) 60 MG tablet, TAKE (1) TABLET BY MOUTH THREE TIMES A DAY., Disp: 90 tablet, Rfl: 1 .  rosuvastatin (CRESTOR) 40 MG tablet, TAKE (1) TABLET BY MOUTH AT BEDTIME., Disp: 30 tablet, Rfl: 5 .  solifenacin (VESICARE) 10 MG tablet, TAKE 1 TABLET BY MOUTH ONCE A  DAY., Disp: 30 tablet, Rfl: 2 .  spironolactone (ALDACTONE) 25 MG tablet, TAKE 1 TABLET BY MOUTH ONCE A DAY., Disp: 30 tablet, Rfl: 3 .  TRADJENTA 5 MG TABS tablet, TAKE 1 TABLET BY MOUTH ONCE A DAY., Disp: 30 tablet, Rfl: 2 .  UNABLE TO FIND, Diabetic shoes x 1 Inserts x 3 DX E11.9, Disp: 1 each, Rfl: 0 .  venlafaxine XR (EFFEXOR-XR) 150 MG 24 hr capsule, TAKE 1 CAPSULE BY MOUTH DAILY., Disp: 30 capsule, Rfl: 2  Allergies Patient has no known allergies.  Review of Systems Review of Systems - Oncology ROS negative other than GI symptoms.     Physical Exam  Vitals Wt Readings from Last 3 Encounters:  11/06/18 259 lb (117.5 kg)  11/03/18 258 lb 12.8 oz (117.4 kg)  10/16/18 255 lb (115.7 kg)   Temp Readings from Last 3 Encounters:  11/06/18 97.6 F (36.4 C) (Oral)  11/03/18 98.8 F (37.1 C) (Oral)  09/22/18 98.7 F (37.1 C) (Oral)   BP Readings from Last 3 Encounters:  11/06/18 (!) 152/51  11/03/18 (!) 193/75  10/16/18 (!) 142/52   Pulse Readings from Last 3 Encounters:  11/06/18 65  11/03/18 65  10/16/18 61   Constitutional: Well-developed, well-nourished, and in no distress.   HENT: Head: Normocephalic and atraumatic.  Mouth/Throat: No oropharyngeal exudate. Mucosa moist. Eyes: Pupils are equal, round, and reactive to light. Conjunctivae are normal. No scleral icterus.  Neck: Normal range of motion. Neck supple. No JVD present.  Cardiovascular: Normal rate, regular rhythm and normal heart sounds.  Exam reveals no gallop and no friction rub.   No murmur heard. Pulmonary/Chest: Effort normal and breath sounds normal. No respiratory distress. No wheezes.No rales.  Abdominal: Soft. Bowel sounds are normal. No distension. There is no tenderness. There is no guarding.  Musculoskeletal: No edema or tenderness.  Lymphadenopathy: No cervical, axillary or supraclavicular adenopathy.  Neurological: Alert and oriented to person, place, and time. No cranial nerve deficit.  Skin:  Skin is warm and dry. No rash noted. No erythema. No pallor.  Psychiatric: Affect and judgment normal.   Labs No visits with results within 3 Day(s) from this visit.  Latest known visit with results is:  Appointment on 11/03/2018  Component Date Value Ref Range Status  . WBC 11/03/2018 5.8  4.0 - 10.5 K/uL Final  . RBC 11/03/2018 3.48* 3.87 - 5.11 MIL/uL Final  . Hemoglobin 11/03/2018 9.7* 12.0 - 15.0 g/dL Final  . HCT 11/03/2018 32.8* 36.0 - 46.0 % Final  . MCV 11/03/2018 94.3  80.0 - 100.0 fL Final  . MCH 11/03/2018 27.9  26.0 - 34.0 pg Final  . MCHC 11/03/2018 29.6* 30.0 - 36.0 g/dL Final  . RDW 11/03/2018 17.0* 11.5 - 15.5 % Final  . Platelets 11/03/2018 258  150 - 400 K/uL Final  . nRBC 11/03/2018 0.0  0.0 - 0.2 % Final  . Neutrophils Relative % 11/03/2018 62  % Final  . Neutro Abs 11/03/2018 3.6  1.7 - 7.7 K/uL Final  . Lymphocytes Relative 11/03/2018 21  % Final  . Lymphs Abs 11/03/2018 1.2  0.7 - 4.0 K/uL Final  . Monocytes Relative 11/03/2018 11  % Final  . Monocytes Absolute 11/03/2018 0.7  0.1 - 1.0 K/uL Final  . Eosinophils Relative 11/03/2018 6  % Final  . Eosinophils Absolute 11/03/2018 0.4  0.0 - 0.5 K/uL Final  . Basophils Relative 11/03/2018 0  % Final  . Basophils Absolute 11/03/2018 0.0  0.0 - 0.1 K/uL Final  . Immature Granulocytes 11/03/2018 0  % Final  . Abs Immature Granulocytes 11/03/2018 0.01  0.00 - 0.07 K/uL Final   Performed at [facility], [address], [place], [place] [address]  . Sodium 11/03/2018 140  135 - 145 mmol/L Final  . Potassium 11/03/2018 4.3  3.5 - 5.1 mmol/L Final  . Chloride 11/03/2018 108  98 - 111 mmol/L Final  . CO2 11/03/2018 26  22 - 32 mmol/L Final  . Glucose, Bld 11/03/2018 78  70 - 99 mg/dL Final  . BUN 11/03/2018 14  8 - 23 mg/dL Final  . Creatinine, Ser 11/03/2018 0.94  0.44 - 1.00 mg/dL Final  . Calcium 11/03/2018 8.7* 8.9 - 10.3 mg/dL Final  . Total Protein 11/03/2018 6.9  6.5 - 8.1 g/dL Final  . Albumin 11/03/2018 3.5   3.5 - 5.0 g/dL Final  . AST 11/03/2018 17  15 - 41 U/L Final  . ALT 11/03/2018 13  0 - 44 U/L Final  . Alkaline Phosphatase 11/03/2018 200* 38 - 126 U/L Final  . Total Bilirubin 11/03/2018 0.5  0.3 - 1.2 mg/dL Final  . GFR calc non Af Amer 11/03/2018 58* >60 mL/min Final  . GFR calc Af Amer 11/03/2018 >60  >60 mL/min Final   Comment: (NOTE) The eGFR has been calculated using the CKD EPI equation. This calculation has not been validated in all clinical situations. eGFR's persistently <60 mL/min signify possible Chronic Kidney Disease.   Georgiann Hahn gap 11/03/2018 6  5 - 15 Final   Performed at [facility], [address] [place], [place] [address]  . LDH 11/03/2018 222* 98 - 192 U/L Final   Performed at [facility], [address] [place], [place] [address]  . Ferritin 11/03/2018 82  11 - 307 ng/mL Final   Performed at [facility],  [address], [place], [place] [address]     Pathology Orders Placed This Encounter  Procedures  . CBC with Differential/Platelet    Standing Status:   Future    Standing Expiration Date:   11/07/2019  . Comprehensive metabolic panel    Standing Status:   Future    Standing Expiration Date:   11/07/2019  . Lactate dehydrogenase    Standing Status:   Future    Standing Expiration Date:   11/07/2019  . Ferritin    Standing Status:   Future    Standing Expiration Date:   11/07/2019  . Hemoglobinopathy evaluation    Standing Status:   Future    Standing Expiration Date:   11/07/2019  . Vitamin B12    Standing Status:   Future    Standing Expiration Date:   11/07/2019  . Folate    Standing Status:   Future    Standing Expiration Date:   11/07/2019  . Haptoglobin    Standing Status:   Future    Standing Expiration Date:   11/07/2019       Zoila Shutter MD

## 2018-11-13 ENCOUNTER — Other Ambulatory Visit: Payer: Self-pay | Admitting: Orthopedic Surgery

## 2018-11-13 ENCOUNTER — Other Ambulatory Visit: Payer: Self-pay | Admitting: Family Medicine

## 2018-11-18 NOTE — Patient Instructions (Signed)
ALABAMA DOIG  11/18/2018     @PREFPERIOPPHARMACY @   Your procedure is scheduled on  12/08/2018   Report to [facility] at  1145   A.M.  Call this number if you have problems the morning of surgery:  [phone]   Remember:  Follow the diet and prep instructions given to you by Dr Roseanne Kaufman office.                       Take these medicines the morning of surgery with A SIP OF WATER  Amlodipine, zyrtec, celexa, diltiazem, hydrocodone( if needed), losartan, zofran ( if needed), mestinon, vesicare, effexor .Use your inhaler before you come.    Do not wear jewelry, make-up or nail polish.  Do not wear lotions, powders, or perfumes, or deodorant.  Do not shave 48 hours prior to surgery.  Men may shave face and neck.  Do not bring valuables to the hospital.  [facility] is not responsible for any belongings or valuables.  Contacts, dentures or bridgework may not be worn into surgery.  Leave your suitcase in the car.  After surgery it may be brought to your room.  For patients admitted to the hospital, discharge time will be determined by your treatment team.  Patients discharged the day of surgery will not be allowed to drive home.   Name and phone number of your driver:  family Special instructions:  Take 1/2 of your usual night time insulin. DO NOT take any medications for diabetes the morning of your procedure.  Please read over the following fact sheets that you were given. Anesthesia Post-op Instructions and Care and Recovery After Surgery       Esophagogastroduodenoscopy Esophagogastroduodenoscopy (EGD) is a procedure to examine the lining of the esophagus, stomach, and first part of the small intestine (duodenum). This procedure is done to check for problems such as inflammation, bleeding, ulcers, or growths. During this procedure, a long, flexible, lighted tube with a camera attached (endoscope) is inserted down the throat. Tell a health care  provider about:  Any allergies you have.  All medicines you are taking, including vitamins, herbs, eye drops, creams, and over-the-counter medicines.  Any problems you or family members have had with anesthetic medicines.  Any blood disorders you have.  Any surgeries you have had.  Any medical conditions you have.  Whether you are pregnant or may be pregnant. What are the risks? Generally, this is a safe procedure. However, problems may occur, including:  Infection.  Bleeding.  A tear (perforation) in the esophagus, stomach, or duodenum.  Trouble breathing.  Excessive sweating.  Spasms of the larynx.  A slowed heartbeat.  Low blood pressure.  What happens before the procedure?  Follow instructions from your health care provider about eating or drinking restrictions.  Ask your health care provider about: ? Changing or stopping your regular medicines. This is especially important if you are taking diabetes medicines or blood thinners. ? Taking medicines such as aspirin and ibuprofen. These medicines can thin your blood. Do not take these medicines before your procedure if your health care provider instructs you not to.  Plan to have someone take you home after the procedure.  If you wear dentures, be ready to remove them before the procedure. What happens during the procedure?  To reduce your risk of infection, your health care team will wash or sanitize their hands.  An IV tube will be put in a vein in your hand or arm. You will get medicines and fluids through this tube.  You will be given one or more of the following: ? A medicine to help you relax (sedative). ? A medicine to numb the area (local anesthetic). This medicine may be sprayed into your throat. It will make you feel more comfortable and keep you from gagging or coughing during the procedure. ? A medicine for pain.  A mouth guard may be placed in your mouth to protect your teeth and to keep you from  biting on the endoscope.  You will be asked to lie on your left side.  The endoscope will be lowered down your throat into your esophagus, stomach, and duodenum.  Air will be put into the endoscope. This will help your health care provider see better.  The lining of your esophagus, stomach, and duodenum will be examined.  Your health care provider may: ? Take a tissue sample so it can be looked at in a lab (biopsy). ? Remove growths. ? Remove objects (foreign bodies) that are stuck. ? Treat any bleeding with medicines or other devices that stop tissue from bleeding. ? Widen (dilate) or stretch narrowed areas of your esophagus and stomach.  The endoscope will be taken out. The procedure may vary among health care providers and hospitals. What happens after the procedure?  Your blood pressure, heart rate, breathing rate, and blood oxygen level will be monitored often until the medicines you were given have worn off.  Do not eat or drink anything until the numbing medicine has worn off and your gag reflex has returned. This information is not intended to replace advice given to you by your health care provider. Make sure you discuss any questions you have with your health care provider. Document Released: 04/19/2005 Document Revised: 05/24/2016 Document Reviewed: 11/10/2015 Elsevier Interactive Patient Education  2018 [employer]. Esophagogastroduodenoscopy, Care After Refer to this sheet in the next few weeks. These instructions provide you with information about caring for yourself after your procedure. Your health care provider may also give you more specific instructions. Your treatment has been planned according to current medical practices, but problems sometimes occur. Call your health care provider if you have any problems or questions after your procedure. What can I expect after the procedure? After the procedure, it is common to have:  A sore  throat.  Nausea.  Bloating.  Dizziness.  Fatigue.  Follow these instructions at home:  Do not eat or drink anything until the numbing medicine (local anesthetic) has worn off and your gag reflex has returned. You will know that the local anesthetic has worn off when you can swallow comfortably.  Do not drive for 24 hours if you received a medicine to help you relax (sedative).  If your health care provider took a tissue sample for testing during the procedure, make sure to get your test results. This is your responsibility. Ask your health care provider or the department performing the test when your results will be ready.  Keep all follow-up visits as told by your health care provider. This is important. Contact a health care provider if:  You cannot stop coughing.  You are not urinating.  You are urinating less than usual. Get help right away if:  You have trouble swallowing.  You cannot eat or drink.  You have throat or chest pain that gets worse.  You are dizzy or light-headed.  You faint.  You have nausea or vomiting.  You have chills.  You have a fever.  You have severe abdominal pain.  You have black, tarry, or bloody stools. This information is not intended to replace advice given to you by your health care provider. Make sure you discuss any questions you have with your health care provider. Document Released: 12/03/2012 Document Revised: 05/24/2016 Document Reviewed: 11/10/2015 Elsevier Interactive Patient Education  2018 [employer].  Esophageal Dilatation Esophageal dilatation is a procedure to open a blocked or narrowed part of the esophagus. The esophagus is the long tube in your throat that carries food and liquid from your mouth to your stomach. The procedure is also called esophageal dilation. You may need this procedure if you have a buildup of scar tissue in your esophagus that makes it difficult, painful, or even impossible to swallow. This  can be caused by gastroesophageal reflux disease (GERD). In rare cases, people need this procedure because they have cancer of the esophagus or a problem with the way food moves through the esophagus. Sometimes you may need to have another dilatation to enlarge the opening of the esophagus gradually. Tell a health care provider about:  Any allergies you have.  All medicines you are taking, including vitamins, herbs, eye drops, creams, and over-the-counter medicines.  Any problems you or family members have had with anesthetic medicines.  Any blood disorders you have.  Any surgeries you have had.  Any medical conditions you have.  Any antibiotic medicines you are required to take before dental procedures. What are the risks? Generally, this is a safe procedure. However, problems can occur and include:  Bleeding from a tear in the lining of the esophagus.  A hole (perforation) in the esophagus.  What happens before the procedure?  Do not eat or drink anything after midnight on the night before the procedure or as directed by your health care provider.  Ask your health care provider about changing or stopping your regular medicines. This is especially important if you are taking diabetes medicines or blood thinners.  Plan to have someone take you home after the procedure. What happens during the procedure?  You will be given a medicine that makes you relaxed and sleepy (sedative).  A medicine may be sprayed or gargled to numb the back of the throat.  Your health care provider can use various instruments to do an esophageal dilatation. During the procedure, the instrument used will be placed in your mouth and passed down into your esophagus. Options include: ? Simple dilators. This instrument is carefully placed in the esophagus to stretch it. ? Guided wire bougies. In this method, a flexible tube (endoscope) is used to insert a wire into the esophagus. The dilator is passed over  this wire to enlarge the esophagus. Then the wire is removed. ? Balloon dilators. An endoscope with a small balloon at the end is passed down into the esophagus. Inflating the balloon gently stretches the esophagus and opens it up. What happens after the procedure?  Your blood pressure, heart rate, breathing rate, and blood oxygen level will be monitored often until the medicines you were given have worn off.  Your throat may feel slightly sore and will probably still feel numb. This will improve slowly over time.  You will not be allowed to eat or drink until the throat numbness has resolved.  If this is a same-day procedure, you may be allowed to go home once you have been able to drink, urinate, and  sit on the edge of the bed without nausea or dizziness.  If this is a same-day procedure, you should have a friend or family member with you for the next 24 hours after the procedure. This information is not intended to replace advice given to you by your health care provider. Make sure you discuss any questions you have with your health care provider. Document Released: 02/07/2006 Document Revised: 05/24/2016 Document Reviewed: 04/28/2014 Elsevier Interactive Patient Education  2018 [place] Anesthesia is a term that refers to techniques, procedures, and medicines that help a person stay safe and comfortable during a medical procedure. Monitored anesthesia care, or sedation, is one type of anesthesia. Your anesthesia specialist may recommend sedation if you will be having a procedure that does not require you to be unconscious, such as:  Cataract surgery.  A dental procedure.  A biopsy.  A colonoscopy.  During the procedure, you may receive a medicine to help you relax (sedative). There are three levels of sedation:  Mild sedation. At this level, you may feel awake and relaxed. You will be able to follow directions.  Moderate sedation. At this level,  you will be sleepy. You may not remember the procedure.  Deep sedation. At this level, you will be asleep. You will not remember the procedure.  The more medicine you are given, the deeper your level of sedation will be. Depending on how you respond to the procedure, the anesthesia specialist may change your level of sedation or the type of anesthesia to fit your needs. An anesthesia specialist will monitor you closely during the procedure. Let your health care provider know about:  Any allergies you have.  All medicines you are taking, including vitamins, herbs, eye drops, creams, and over-the-counter medicines.  Any use of steroids (by mouth or as a cream).  Any problems you or family members have had with sedatives and anesthetic medicines.  Any blood disorders you have.  Any surgeries you have had.  Any medical conditions you have, such as sleep apnea.  Whether you are pregnant or may be pregnant.  Any use of cigarettes, alcohol, or street drugs. What are the risks? Generally, this is a safe procedure. However, problems may occur, including:  Getting too much medicine (oversedation).  Nausea.  Allergic reaction to medicines.  Trouble breathing. If this happens, a breathing tube may be used to help with breathing. It will be removed when you are awake and breathing on your own.  Heart trouble.  Lung trouble.  Before the procedure Staying hydrated Follow instructions from your health care provider about hydration, which may include:  Up to 2 hours before the procedure - you may continue to drink clear liquids, such as water, clear fruit juice, black coffee, and plain tea.  Eating and drinking restrictions Follow instructions from your health care provider about eating and drinking, which may include:  8 hours before the procedure - stop eating heavy meals or foods such as meat, fried foods, or fatty foods.  6 hours before the procedure - stop eating light meals or  foods, such as toast or cereal.  6 hours before the procedure - stop drinking milk or drinks that contain milk.  2 hours before the procedure - stop drinking clear liquids.  Medicines Ask your health care provider about:  Changing or stopping your regular medicines. This is especially important if you are taking diabetes medicines or blood thinners.  Taking medicines such as aspirin and ibuprofen. These  medicines can thin your blood. Do not take these medicines before your procedure if your health care provider instructs you not to.  Tests and exams  You will have a physical exam.  You may have blood tests done to show: ? How well your kidneys and liver are working. ? How well your blood can clot.  General instructions  Plan to have someone take you home from the hospital or clinic.  If you will be going home right after the procedure, plan to have someone with you for 24 hours.  What happens during the procedure?  Your blood pressure, heart rate, breathing, level of pain and overall condition will be monitored.  An IV tube will be inserted into one of your veins.  Your anesthesia specialist will give you medicines as needed to keep you comfortable during the procedure. This may mean changing the level of sedation.  The procedure will be performed. After the procedure  Your blood pressure, heart rate, breathing rate, and blood oxygen level will be monitored until the medicines you were given have worn off.  Do not drive for 24 hours if you received a sedative.  You may: ? Feel sleepy, clumsy, or nauseous. ? Feel forgetful about what happened after the procedure. ? Have a sore throat if you had a breathing tube during the procedure. ? Vomit. This information is not intended to replace advice given to you by your health care provider. Make sure you discuss any questions you have with your health care provider. Document Released: 09/12/2005 Document Revised: 05/25/2016  Document Reviewed: 04/08/2016 Elsevier Interactive Patient Education  2018 [place], Care After These instructions provide you with information about caring for yourself after your procedure. Your health care provider may also give you more specific instructions. Your treatment has been planned according to current medical practices, but problems sometimes occur. Call your health care provider if you have any problems or questions after your procedure. What can I expect after the procedure? After your procedure, it is common to:  Feel sleepy for several hours.  Feel clumsy and have poor balance for several hours.  Feel forgetful about what happened after the procedure.  Have poor judgment for several hours.  Feel nauseous or vomit.  Have a sore throat if you had a breathing tube during the procedure.  Follow these instructions at home: For at least 24 hours after the procedure:   Do not: ? Participate in activities in which you could fall or become injured. ? Drive. ? Use heavy machinery. ? Drink alcohol. ? Take sleeping pills or medicines that cause drowsiness. ? Make important decisions or sign legal documents. ? Take care of children on your own.  Rest. Eating and drinking  Follow the diet that is recommended by your health care provider.  If you vomit, drink water, juice, or soup when you can drink without vomiting.  Make sure you have little or no nausea before eating solid foods. General instructions  Have a responsible adult stay with you until you are awake and alert.  Take over-the-counter and prescription medicines only as told by your health care provider.  If you smoke, do not smoke without supervision.  Keep all follow-up visits as told by your health care provider. This is important. Contact a health care provider if:  You keep feeling nauseous or you keep vomiting.  You feel light-headed.  You develop a rash.  You  have a fever. Get help right away if:  You have trouble breathing. This information is not intended to replace advice given to you by your health care provider. Make sure you discuss any questions you have with your health care provider. Document Released: 04/08/2016 Document Revised: 08/08/2016 Document Reviewed: 04/08/2016 Elsevier Interactive Patient Education  [employer].

## 2018-11-20 ENCOUNTER — Encounter: Payer: Medicare Other | Admitting: Family Medicine

## 2018-12-01 ENCOUNTER — Encounter (HOSPITAL_COMMUNITY)
Admission: RE | Admit: 2018-12-01 | Discharge: 2018-12-01 | Disposition: A | Discharge status: Home / Self Care | Payer: Medicare Other | Source: Ambulatory Visit | Attending: Internal Medicine | Admitting: Internal Medicine

## 2018-12-01 ENCOUNTER — Encounter (HOSPITAL_COMMUNITY): Payer: Self-pay

## 2018-12-01 ENCOUNTER — Other Ambulatory Visit: Payer: Self-pay

## 2018-12-01 DIAGNOSIS — Z01812 Encounter for preprocedural laboratory examination: Secondary | ICD-10-CM | POA: Diagnosis not present

## 2018-12-01 LAB — CBC WITH DIFFERENTIAL/PLATELET
Abs Immature Granulocytes: 0.03 10*3/uL (ref 0.00–0.07)
BASOS ABS: 0 10*3/uL (ref 0.0–0.1)
BASOS PCT: 0 %
EOS ABS: 0.4 10*3/uL (ref 0.0–0.5)
Eosinophils Relative: 5 %
HCT: 33.8 % — ABNORMAL LOW (ref 36.0–46.0)
Hemoglobin: 10.3 g/dL — ABNORMAL LOW (ref 12.0–15.0)
IMMATURE GRANULOCYTES: 0 %
Lymphocytes Relative: 18 %
Lymphs Abs: 1.6 10*3/uL (ref 0.7–4.0)
MCH: 28.4 pg (ref 26.0–34.0)
MCHC: 30.5 g/dL (ref 30.0–36.0)
MCV: 93.1 fL (ref 80.0–100.0)
Monocytes Absolute: 0.8 10*3/uL (ref 0.1–1.0)
Monocytes Relative: 9 %
NEUTROS PCT: 68 %
NRBC: 0 % (ref 0.0–0.2)
Neutro Abs: 5.9 10*3/uL (ref 1.7–7.7)
Platelets: 321 10*3/uL (ref 150–400)
RBC: 3.63 MIL/uL — ABNORMAL LOW (ref 3.87–5.11)
RDW: 15.9 % — AB (ref 11.5–15.5)
WBC: 8.8 10*3/uL (ref 4.0–10.5)

## 2018-12-08 ENCOUNTER — Ambulatory Visit (HOSPITAL_COMMUNITY): Payer: Medicare Other | Admitting: Anesthesiology

## 2018-12-08 ENCOUNTER — Encounter (HOSPITAL_COMMUNITY): Payer: Self-pay

## 2018-12-08 ENCOUNTER — Ambulatory Visit (HOSPITAL_COMMUNITY)
Admission: RE | Admit: 2018-12-08 | Discharge: 2018-12-08 | Disposition: A | Discharge status: Home / Self Care | Payer: Medicare Other | Source: Ambulatory Visit | Attending: Internal Medicine | Admitting: Internal Medicine

## 2018-12-08 ENCOUNTER — Encounter (HOSPITAL_COMMUNITY)
Admission: RE | Disposition: A | Discharge status: Home / Self Care | Payer: Self-pay | Source: Ambulatory Visit | Attending: Internal Medicine

## 2018-12-08 DIAGNOSIS — E669 Obesity, unspecified: Secondary | ICD-10-CM | POA: Insufficient documentation

## 2018-12-08 DIAGNOSIS — Z7951 Long term (current) use of inhaled steroids: Secondary | ICD-10-CM | POA: Diagnosis not present

## 2018-12-08 DIAGNOSIS — E785 Hyperlipidemia, unspecified: Secondary | ICD-10-CM | POA: Insufficient documentation

## 2018-12-08 DIAGNOSIS — Z79899 Other long term (current) drug therapy: Secondary | ICD-10-CM | POA: Diagnosis not present

## 2018-12-08 DIAGNOSIS — F419 Anxiety disorder, unspecified: Secondary | ICD-10-CM | POA: Diagnosis not present

## 2018-12-08 DIAGNOSIS — Z6841 Body Mass Index (BMI) 40.0 and over, adult: Secondary | ICD-10-CM | POA: Diagnosis not present

## 2018-12-08 DIAGNOSIS — Z8711 Personal history of peptic ulcer disease: Secondary | ICD-10-CM | POA: Diagnosis not present

## 2018-12-08 DIAGNOSIS — R1310 Dysphagia, unspecified: Secondary | ICD-10-CM

## 2018-12-08 DIAGNOSIS — I129 Hypertensive chronic kidney disease with stage 1 through stage 4 chronic kidney disease, or unspecified chronic kidney disease: Secondary | ICD-10-CM | POA: Diagnosis not present

## 2018-12-08 DIAGNOSIS — Z96651 Presence of right artificial knee joint: Secondary | ICD-10-CM | POA: Insufficient documentation

## 2018-12-08 DIAGNOSIS — D509 Iron deficiency anemia, unspecified: Secondary | ICD-10-CM

## 2018-12-08 DIAGNOSIS — Z7952 Long term (current) use of systemic steroids: Secondary | ICD-10-CM | POA: Insufficient documentation

## 2018-12-08 DIAGNOSIS — Z794 Long term (current) use of insulin: Secondary | ICD-10-CM | POA: Insufficient documentation

## 2018-12-08 DIAGNOSIS — I272 Pulmonary hypertension, unspecified: Secondary | ICD-10-CM | POA: Insufficient documentation

## 2018-12-08 DIAGNOSIS — D631 Anemia in chronic kidney disease: Secondary | ICD-10-CM | POA: Insufficient documentation

## 2018-12-08 DIAGNOSIS — R1010 Upper abdominal pain, unspecified: Secondary | ICD-10-CM

## 2018-12-08 DIAGNOSIS — K219 Gastro-esophageal reflux disease without esophagitis: Secondary | ICD-10-CM | POA: Diagnosis not present

## 2018-12-08 DIAGNOSIS — G7000 Myasthenia gravis without (acute) exacerbation: Secondary | ICD-10-CM | POA: Diagnosis not present

## 2018-12-08 DIAGNOSIS — K222 Esophageal obstruction: Secondary | ICD-10-CM | POA: Insufficient documentation

## 2018-12-08 DIAGNOSIS — R112 Nausea with vomiting, unspecified: Secondary | ICD-10-CM

## 2018-12-08 DIAGNOSIS — K449 Diaphragmatic hernia without obstruction or gangrene: Secondary | ICD-10-CM | POA: Diagnosis not present

## 2018-12-08 DIAGNOSIS — F329 Major depressive disorder, single episode, unspecified: Secondary | ICD-10-CM | POA: Diagnosis not present

## 2018-12-08 DIAGNOSIS — I10 Essential (primary) hypertension: Secondary | ICD-10-CM | POA: Diagnosis not present

## 2018-12-08 DIAGNOSIS — G4733 Obstructive sleep apnea (adult) (pediatric): Secondary | ICD-10-CM | POA: Insufficient documentation

## 2018-12-08 DIAGNOSIS — E1122 Type 2 diabetes mellitus with diabetic chronic kidney disease: Secondary | ICD-10-CM | POA: Insufficient documentation

## 2018-12-08 DIAGNOSIS — N183 Chronic kidney disease, stage 3 (moderate): Secondary | ICD-10-CM | POA: Diagnosis not present

## 2018-12-08 DIAGNOSIS — Z87891 Personal history of nicotine dependence: Secondary | ICD-10-CM | POA: Insufficient documentation

## 2018-12-08 DIAGNOSIS — R1319 Other dysphagia: Secondary | ICD-10-CM

## 2018-12-08 HISTORY — PX: ESOPHAGOGASTRODUODENOSCOPY (EGD) WITH PROPOFOL: SHX5813

## 2018-12-08 HISTORY — PX: MALONEY DILATION: SHX5535

## 2018-12-08 LAB — GLUCOSE, CAPILLARY
Glucose-Capillary: 50 mg/dL — ABNORMAL LOW (ref 70–99)
Glucose-Capillary: 52 mg/dL — ABNORMAL LOW (ref 70–99)
Glucose-Capillary: 57 mg/dL — ABNORMAL LOW (ref 70–99)

## 2018-12-08 SURGERY — ESOPHAGOGASTRODUODENOSCOPY (EGD) WITH PROPOFOL
Anesthesia: General

## 2018-12-08 MED ORDER — CHLORHEXIDINE GLUCONATE CLOTH 2 % EX PADS: 6.0000 | MEDICATED_PAD | Freq: Once | CUTANEOUS | Status: DC

## 2018-12-08 MED ORDER — DEXTROSE 50 % IV SOLN
12.5000 g | Freq: Once | INTRAVENOUS | Status: AC
  Administered 2018-12-08: 12.5 g via INTRAVENOUS

## 2018-12-08 MED ORDER — DEXTROSE 50 % IV SOLN
INTRAVENOUS | Status: AC
  Filled 2018-12-08: qty 50

## 2018-12-08 MED ORDER — LACTATED RINGERS IV SOLN
INTRAVENOUS | Status: DC
  Administered 2018-12-08: 13:00:00 via INTRAVENOUS

## 2018-12-08 MED ORDER — PROPOFOL 10 MG/ML IV BOLUS
INTRAVENOUS | Status: AC
  Filled 2018-12-08: qty 40

## 2018-12-08 MED ORDER — MIDAZOLAM HCL 2 MG/2ML IJ SOLN: 0.5000 mg | Freq: Once | INTRAMUSCULAR | Status: DC | PRN

## 2018-12-08 MED ORDER — PROMETHAZINE HCL 25 MG/ML IJ SOLN: 6.2500 mg | INTRAMUSCULAR | Status: DC | PRN

## 2018-12-08 MED ORDER — HYDROCODONE-ACETAMINOPHEN 7.5-325 MG PO TABS: 1.0000 | ORAL_TABLET | Freq: Once | ORAL | Status: DC | PRN

## 2018-12-08 MED ORDER — STERILE WATER FOR IRRIGATION IR SOLN
Status: DC | PRN
  Administered 2018-12-08: 1.5 mL

## 2018-12-08 MED ORDER — PROPOFOL 500 MG/50ML IV EMUL
INTRAVENOUS | Status: DC | PRN
  Administered 2018-12-08: 150 ug/kg/min via INTRAVENOUS

## 2018-12-08 MED ORDER — HYDROMORPHONE HCL 1 MG/ML IJ SOLN: 0.2500 mg | INTRAMUSCULAR | Status: DC | PRN

## 2018-12-08 MED ORDER — LACTATED RINGERS IV SOLN
INTRAVENOUS | Status: DC | PRN
  Administered 2018-12-08: 12:00:00 via INTRAVENOUS

## 2018-12-08 NOTE — Transfer of Care (Signed)
Immediate Anesthesia Transfer of Care Note  Patient: [name]  Procedure(s) Performed: ESOPHAGOGASTRODUODENOSCOPY (EGD) WITH PROPOFOL (N/A ) MALONEY DILATION (N/A )  Patient Location: PACU  Anesthesia Type:MAC  Level of Consciousness: awake and patient cooperative  Airway & Oxygen Therapy: Patient Spontanous Breathing and Patient connected to nasal cannula oxygen  Post-op Assessment: Report given to RN, Post -op Vital signs reviewed and stable and Patient moving all extremities  Post vital signs: Reviewed and stable  Last Vitals:  Vitals Value Taken Time  BP    Temp    Pulse    Resp    SpO2      Last Pain:  Vitals:   12/08/18 1223  TempSrc: Oral  PainSc: 0-No pain         Complications: No apparent anesthesia complications

## 2018-12-08 NOTE — Op Note (Signed)
[facility] Patient Name: [name] Procedure Date: 12/08/2018 1:11 PM MRN: [id number] Date of Birth: [birth year] Attending MD: Norvel Richards , MD CSN: [id number] Age: 77 Admit Type: Outpatient Procedure:                Upper GI endoscopy Indications:              Dysphagia Providers:                Norvel Richards, MD, Gerome Sam, RN,                            Aram Candela Referring MD:              Medicines:                Propofol per Anesthesia Complications:            No immediate complications. Estimated Blood Loss:     Estimated blood loss: none. Procedure:                Pre-Anesthesia Assessment:                           - Prior to the procedure, a History and Physical                            was performed, and patient medications and                            allergies were reviewed. The patient's tolerance of                            previous anesthesia was also reviewed. The risks                            and benefits of the procedure and the sedation                            options and risks were discussed with the patient.                            All questions were answered, and informed consent                            was obtained. Prior Anticoagulants: The patient has                            taken no previous anticoagulant or antiplatelet                            agents. After reviewing the risks and benefits, the                            patient was deemed in satisfactory condition to                            undergo  the procedure.                           After obtaining informed consent, the endoscope was                            passed under direct vision. Throughout the                            procedure, the patient's blood pressure, pulse, and                            oxygen saturations were monitored continuously. The                            [id number]-[id number] ([id number]) scope was introduced through the                            mouth, and advanced to the second part of duodenum. Scope In: 1:43:06 PM Scope Out: 1:48:35 PM Total Procedure Duration: 0 hours 5 minutes 29 seconds  Findings:      A mild Schatzki ring was found at the gastroesophageal junction. The       scope was withdrawn. Dilation was performed with a Maloney dilator with       mild resistance at 56 Fr. The scope was withdrawn. Dilation was       performed with a Maloney dilator with mild resistance at 49 Fr. The       dilation site was examined following endoscope reinsertion and showed no       change. Estimated blood loss: none.      The exam of the esophagus was otherwise normal.      A small hiatal hernia was present.      The exam was otherwise without abnormality.      The duodenal bulb and second portion of the duodenum were normal. Impression:               - Mild Schatzki ring. Dilated.                           - Small hiatal hernia.                           - The examination was otherwise normal.                           - Normal duodenal bulb and second portion of the                            duodenum.                           - No specimens collected. Moderate Sedation:      Moderate (conscious) sedation was personally administered by an       anesthesia professional. The following parameters were monitored: oxygen       saturation, heart rate, blood pressure, respiratory rate, EKG, adequacy       of pulmonary ventilation, and response  to care. Recommendation:           - Patient has a contact number available for                            emergencies. The signs and symptoms of potential                            delayed complications were discussed with the                            patient. Return to normal activities tomorrow.                            Written discharge instructions were provided to the                            patient.                           - Resume previous diet.                            - Continue present medications. Continue Protonix                            40 mg twice daily                           - No repeat upper endoscopy.                           - Return to GI office in 3 months. Procedure Code(s):        --- Professional ---                           [phone], Esophagogastroduodenoscopy, flexible,                            transoral; diagnostic, including collection of                            specimen(s) by brushing or washing, when performed                            (separate procedure)                           43450, Dilation of esophagus, by unguided sound or                            bougie, single or multiple passes Diagnosis Code(s):        --- Professional ---                           K22.2, Esophageal obstruction  K44.9, Diaphragmatic hernia without obstruction or                            gangrene                           R13.10, Dysphagia, unspecified CPT copyright 2018 American [employer]. All rights reserved. The codes documented in this report are preliminary and upon coder review may  be revised to meet current compliance requirements. Cristopher Estimable. Ad Guttman, MD Norvel Richards, MD 12/08/2018 2:02:30 PM This report has been signed electronically. Number of Addenda: 0

## 2018-12-08 NOTE — Discharge Instructions (Signed)
EGD Discharge instructions Please read the instructions outlined below and refer to this sheet in the next few weeks. These discharge instructions provide you with general information on caring for yourself after you leave the hospital. Your doctor may also give you specific instructions. While your treatment has been planned according to the most current medical practices available, unavoidable complications occasionally occur. If you have any problems or questions after discharge, please call your doctor. ACTIVITY  You may resume your regular activity but move at a slower pace for the next 24 hours.   Take frequent rest periods for the next 24 hours.   Walking will help expel (get rid of) the air and reduce the bloated feeling in your abdomen.   No driving for 24 hours (because of the anesthesia (medicine) used during the test).   You may shower.   Do not sign any important legal documents or operate any machinery for 24 hours (because of the anesthesia used during the test).  NUTRITION  Drink plenty of fluids.   You may resume your normal diet.   Begin with a light meal and progress to your normal diet.   Avoid alcoholic beverages for 24 hours or as instructed by your caregiver.  MEDICATIONS  You may resume your normal medications unless your caregiver tells you otherwise.  WHAT YOU CAN EXPECT TODAY  You may experience abdominal discomfort such as a feeling of fullness or gas pains.  FOLLOW-UP  Your doctor will discuss the results of your test with you.  SEEK IMMEDIATE MEDICAL ATTENTION IF ANY OF THE FOLLOWING OCCUR:  Excessive nausea (feeling sick to your stomach) and/or vomiting.   Severe abdominal pain and distention (swelling).   Trouble swallowing.   Temperature over 101 F (37.8 C).   Rectal bleeding or vomiting of blood.    Continue Protonix 40 mg twice daily  GERD and hiatal hernia information provided  Office visit with [place] in 3  months     Gastroesophageal Reflux Disease, Adult Normally, food travels down the esophagus and stays in the stomach to be digested. If a person has gastroesophageal reflux disease (GERD), food and stomach acid move back up into the esophagus. When this happens, the esophagus becomes sore and swollen (inflamed). Over time, GERD can make small holes (ulcers) in the lining of the esophagus. Follow these instructions at home: Diet  Follow a diet as told by your doctor. You may need to avoid foods and drinks such as: ? Coffee and tea (with or without caffeine). ? Drinks that contain alcohol. ? Energy drinks and sports drinks. ? Carbonated drinks or sodas. ? Chocolate and cocoa. ? Peppermint and mint flavorings. ? Garlic and onions. ? Horseradish. ? Spicy and acidic foods, such as peppers, chili powder, curry powder, vinegar, hot sauces, and BBQ sauce. ? Citrus fruit juices and citrus fruits, such as oranges, lemons, and limes. ? Tomato-based foods, such as red sauce, chili, salsa, and pizza with red sauce. ? Fried and fatty foods, such as donuts, french fries, potato chips, and high-fat dressings. ? High-fat meats, such as hot dogs, rib eye steak, sausage, ham, and bacon. ? High-fat dairy items, such as whole milk, butter, and cream cheese.  Eat small meals often. Avoid eating large meals.  Avoid drinking large amounts of liquid with your meals.  Avoid eating meals during the 2-3 hours before bedtime.  Avoid lying down right after you eat.  Do not exercise right after you eat. General instructions  Pay attention to  any changes in your symptoms.  Take over-the-counter and prescription medicines only as told by your doctor. Do not take aspirin, ibuprofen, or other NSAIDs unless your doctor says it is okay.  Do not use any tobacco products, including cigarettes, chewing tobacco, and e-cigarettes. If you need help quitting, ask your doctor.  Wear loose clothes. Do not wear anything  tight around your waist.  Raise (elevate) the head of your bed about 6 inches (15 cm).  Try to lower your stress. If you need help doing this, ask your doctor.  If you are overweight, lose an amount of weight that is healthy for you. Ask your doctor about a safe weight loss goal.  Keep all follow-up visits as told by your doctor. This is important. Contact a doctor if:  You have new symptoms.  You lose weight and you do not know why it is happening.  You have trouble swallowing, or it hurts to swallow.  You have wheezing or a cough that keeps happening.  Your symptoms do not get better with treatment.  You have a hoarse voice. Get help right away if:  You have pain in your arms, neck, jaw, teeth, or back.  You feel sweaty, dizzy, or light-headed.  You have chest pain or shortness of breath.  You throw up (vomit) and your throw up looks like blood or coffee grounds.  You pass out (faint).  Your poop (stool) is bloody or black.  You cannot swallow, drink, or eat. This information is not intended to replace advice given to you by your health care provider. Make sure you discuss any questions you have with your health care provider. Document Released: 06/04/2008 Document Revised: 05/24/2016 Document Reviewed: 04/13/2015 Elsevier Interactive Patient Education  2018 [employer].    Hiatal Hernia A hiatal hernia occurs when part of the stomach slides above the muscle that separates the abdomen from the chest (diaphragm). A person can be born with a hiatal hernia (congenital), or it may develop over time. In almost all cases of hiatal hernia, only the top part of the stomach pushes through the diaphragm. Many people have a hiatal hernia with no symptoms. The larger the hernia, the more likely it is that you will have symptoms. In some cases, a hiatal hernia allows stomach acid to flow back into the tube that carries food from your mouth to your stomach (esophagus). This may  cause heartburn symptoms. Severe heartburn symptoms may mean that you have developed a condition called gastroesophageal reflux disease (GERD). What are the causes? This condition is caused by a weakness in the opening (hiatus) where the esophagus passes through the diaphragm to attach to the upper part of the stomach. A person may be born with a weakness in the hiatus, or a weakness can develop over time. What increases the risk? This condition is more likely to develop in:  Older people. Age is a major risk factor for a hiatal hernia, especially if you are over the age of 56.  Pregnant women.  People who are overweight.  People who have frequent constipation.  What are the signs or symptoms? Symptoms of this condition usually develop in the form of GERD symptoms. Symptoms include:  Heartburn.  Belching.  Indigestion.  Trouble swallowing.  Coughing or wheezing.  Sore throat.  Hoarseness.  Chest pain.  Nausea and vomiting.  How is this diagnosed? This condition may be diagnosed during testing for GERD. Tests that may be done include:  X-rays of your stomach  or chest.  An upper gastrointestinal (GI) series. This is an X-ray exam of your GI tract that is taken after you swallow a chalky liquid that shows up clearly on the X-ray.  Endoscopy. This is a procedure to look into your stomach using a thin, flexible tube that has a tiny camera and light on the end of it.  How is this treated? This condition may be treated by:  Dietary and lifestyle changes to help reduce GERD symptoms.  Medicines. These may include: ? Over-the-counter antacids. ? Medicines that make your stomach empty more quickly. ? Medicines that block the production of stomach acid (H2 blockers). ? Stronger medicines to reduce stomach acid (proton pump inhibitors).  Surgery to repair the hernia, if other treatments are not helping.  If you have no symptoms, you may not need treatment. Follow these  instructions at home: Lifestyle and activity  Do not use any products that contain nicotine or tobacco, such as cigarettes and e-cigarettes. If you need help quitting, ask your health care provider.  Try to achieve and maintain a healthy body weight.  Avoid putting pressure on your abdomen. Anything that puts pressure on your abdomen increases the amount of acid that may be pushed up into your esophagus. ? Avoid bending over, especially after eating. ? Raise the head of your bed by putting blocks under the legs. This keeps your head and esophagus higher than your stomach. ? Do not wear tight clothing around your chest or stomach. ? Try not to strain when having a bowel movement, when urinating, or when lifting heavy objects. Eating and drinking  Avoid foods that can worsen GERD symptoms. These may include: ? Fatty foods, like fried foods. ? Citrus fruits, like oranges or lemon. ? Other foods and drinks that contain acid, like orange juice or tomatoes. ? Spicy food. ? Chocolate.  Eat frequent small meals instead of three large meals a day. This helps prevent your stomach from getting too full. ? Eat slowly. ? Do not lie down right after eating. ? Do not eat 1-2 hours before bed.  Do not drink beverages with caffeine. These include cola, coffee, cocoa, and tea.  Do not drink alcohol. General instructions  Take over-the-counter and prescription medicines only as told by your health care provider.  Keep all follow-up visits as told by your health care provider. This is important. Contact a health care provider if:  Your symptoms are not controlled with medicines or lifestyle changes.  You are having trouble swallowing.  You have coughing or wheezing that will not go away. Get help right away if:  Your pain is getting worse.  Your pain spreads to your arms, neck, jaw, teeth, or back.  You have shortness of breath.  You sweat for no reason.  You feel sick to your stomach  (nauseous) or you vomit.  You vomit blood.  You have bright red blood in your stools.  You have black, tarry stools. This information is not intended to replace advice given to you by your health care provider. Make sure you discuss any questions you have with your health care provider. Document Released: 03/08/2004 Document Revised: 12/10/2016 Document Reviewed: 12/10/2016 Elsevier Interactive Patient Education  2018 [place], Care After These instructions provide you with information about caring for yourself after your procedure. Your health care provider may also give you more specific instructions. Your treatment has been planned according to current medical practices, but problems sometimes  occur. Call your health care provider if you have any problems or questions after your procedure. What can I expect after the procedure? After your procedure, it is common to:  Feel sleepy for several hours.  Feel clumsy and have poor balance for several hours.  Feel forgetful about what happened after the procedure.  Have poor judgment for several hours.  Feel nauseous or vomit.  Have a sore throat if you had a breathing tube during the procedure.  Follow these instructions at home: For at least 24 hours after the procedure:   Do not: ? Participate in activities in which you could fall or become injured. ? Drive. ? Use heavy machinery. ? Drink alcohol. ? Take sleeping pills or medicines that cause drowsiness. ? Make important decisions or sign legal documents. ? Take care of children on your own.  Rest. Eating and drinking  Follow the diet that is recommended by your health care provider.  If you vomit, drink water, juice, or soup when you can drink without vomiting.  Make sure you have little or no nausea before eating solid foods. General instructions  Have a responsible adult stay with you until you are awake and alert.  Take  over-the-counter and prescription medicines only as told by your health care provider.  If you smoke, do not smoke without supervision.  Keep all follow-up visits as told by your health care provider. This is important. Contact a health care provider if:  You keep feeling nauseous or you keep vomiting.  You feel light-headed.  You develop a rash.  You have a fever. Get help right away if:  You have trouble breathing. This information is not intended to replace advice given to you by your health care provider. Make sure you discuss any questions you have with your health care provider. Document Released: 04/08/2016 Document Revised: 08/08/2016 Document Reviewed: 04/08/2016 Elsevier Interactive Patient Education  [employer].

## 2018-12-08 NOTE — Anesthesia Postprocedure Evaluation (Signed)
Anesthesia Post Note  Patient: [name]  Procedure(s) Performed: ESOPHAGOGASTRODUODENOSCOPY (EGD) WITH PROPOFOL (N/A ) MALONEY DILATION (N/A )  Patient location during evaluation: PACU Anesthesia Type: General Level of consciousness: awake and patient cooperative Pain management: pain level controlled Vital Signs Assessment: post-procedure vital signs reviewed and stable Respiratory status: spontaneous breathing, nonlabored ventilation and respiratory function stable Cardiovascular status: blood pressure returned to baseline Postop Assessment: adequate PO intake and no apparent nausea or vomiting Anesthetic complications: no     Last Vitals:  Vitals:   12/08/18 1223  BP: (!) 157/65  Pulse: 61  Resp: 16  Temp: 36.6 C  SpO2: 99%    Last Pain:  Vitals:   12/08/18 1223  TempSrc: Oral  PainSc: 0-No pain                 Raechal Raben J

## 2018-12-08 NOTE — Anesthesia Preprocedure Evaluation (Signed)
Anesthesia Evaluation  Patient identified by MRN, date of birth, ID band Patient awake    Reviewed: Allergy & Precautions, NPO status , Patient's Chart, lab work & pertinent test results  History of Anesthesia Complications Negative for: history of anesthetic complications  Airway Mallampati: I  TM Distance: >3 FB Neck ROM: Full    Dental no notable dental hx. (+) Edentulous Upper, Poor Dentition   Pulmonary sleep apnea , pneumonia, former smoker,    Pulmonary exam normal breath sounds clear to auscultation       Cardiovascular Exercise Tolerance: Poor hypertension, Pt. 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   Neuromuscular disease negative psych ROS   GI/Hepatic Neg liver ROS, PUD, GERD  Medicated and Controlled,  Endo/Other  diabetesHypothyroidism   Renal/GU Renal InsufficiencyRenal disease  negative genitourinary   Musculoskeletal  (+) Arthritis , Osteoarthritis,    Abdominal   Peds negative pediatric ROS (+)  Hematology negative hematology ROS (+) anemia ,   Anesthesia Other Findings   Reproductive/Obstetrics negative OB ROS                             Anesthesia Physical Anesthesia Plan  ASA: III  Anesthesia Plan: General   Post-op Pain Management:    Induction: Intravenous  PONV Risk Score and Plan:   Airway Management Planned: Nasal Cannula  Additional Equipment:   Intra-op Plan:   Post-operative Plan:   Informed Consent: I have reviewed the patients History and Physical, chart, labs and discussed the procedure including the risks, benefits and alternatives for the proposed anesthesia with the patient or authorized representative who has indicated his/her understanding and acceptance.   Dental  advisory given  Plan Discussed with: CRNA  Anesthesia Plan Comments:         Anesthesia Quick Evaluation

## 2018-12-08 NOTE — H&P (Signed)
@LOGO @   Primary Care Physician:  Fayrene Helper, MD Primary Gastroenterologist:  Dr. Gala Romney  Pre-Procedure History & Physical: HPI:  [name] is a 77 y.o. female here for worsening reflux symptoms and recurrent esophageal dysphagia.  History of Schatzki's ring.  Past Medical History:  Diagnosis Date  . Anemia in chronic renal disease 12/30/2015  . Anxiety   . Arthritis    "all over" (01/19/2014)  . Arthritis, lumbar spine   . Carpal tunnel syndrome   . Chronic bronchitis ([place])    "got it q yr for awhile; hasn't had it in awhile" (01/19/2014)  . Chronic kidney disease (CKD) stage G3b/A1, moderately decreased glomerular filtration rate (GFR) between 30-44 mL/min/1.73 square meter and albuminuria creatinine ratio less than 30 mg/g (HCC) 09/14/2009   Qualifier: Diagnosis of  By: Moshe Cipro MD, Joycelyn Schmid    . Chronic renal disease, stage 3, moderately decreased glomerular filtration rate (GFR) between 30-59 mL/min/1.73 square meter (HCC) 09/14/2009   Qualifier: Diagnosis of  By: Moshe Cipro MD, Joycelyn Schmid    . Complication of anesthesia    combative  . Depression   . Diverticulosis 07/2004   Colonscopy Dr Gala Romney  . Esophagitis, erosive 2009  . Exertional shortness of breath   . Gastroesophageal reflux   . ZDGUYQIH(474.2)    "usually a couple times/wk" (01/19/2014)  . Heart murmur    saw cardiology In [place], he told her she did not need to come back.  . Hyperlipidemia   . Hypertension   . Impingement syndrome, shoulder   . Iron deficiency anemia   . Mitral regurgitation   . Myasthenia gravis   . Myasthenia gravis in remission ([place]) 11/24/2014  . Obesity   . OSA on CPAP   . Pneumonia 01/2012  . Pulmonary HTN ([place])   . Schatzki's ring    Last EGD w/ dilation 02/08/11, 2009 & 2007  . Seasonal allergies   . Shoulder pain   . Thyroid disease    "used to take RX; they took me off it" (01/19/2014)  . Tobacco abuse   . Type II diabetes mellitus ([place])     Past Surgical History:   Procedure Laterality Date  . [place] VITRECTOMY WITH 20 GAUGE MVR PORT Left 01/19/2014   Procedure: 25 GAUGE PARS PLANA VITRECTOMY WITH 20 GAUGE MVR PORT; MEMBRAME PEEL; SERUM PATCH; LASER TREATMENT; C3F8;  Surgeon: Hayden Pedro, MD;  Location: [facility];  Service: Ophthalmology;  Laterality: Left;  . ABDOMINAL HYSTERECTOMY    . APPENDECTOMY    . CARPAL TUNNEL RELEASE Bilateral   . CATARACT EXTRACTION W/PHACO Left 10/21/2013   Procedure: LEFT CATARACT EXTRACTION PHACO AND INTRAOCULAR LENS PLACEMENT (IOC);  Surgeon: Marylynn Pearson, MD;  Location: [facility];  Service: Ophthalmology;  Laterality: Left;  . CHOLECYSTECTOMY    . COLONOSCOPY  05/08/2012   VZD:GLOVFIEP and external hemorrhoids; colonic diverticulosis  . ESOPHAGEAL DILATION     "more than 3 times" (01/19/2014)  . ESOPHAGOGASTRODUODENOSCOPY  02/08/11   [place]-Distal esophageal erosion consistent with mild erosive reflux   esophagitis/ Noncritical Schatzki ring, small hiatal hernia otherwise upper/ gastrointestinal tract appeared unremarkable, status post passage  of a Maloney dilation to biopsy disruption of the ring described  . ESOPHAGOGASTRODUODENOSCOPY  04/2012   2 tandem incomplete distal esophagea rings s/p dilation.   . ESOPHAGOGASTRODUODENOSCOPY N/A 09/16/2014   Dr. Gala Romney: Schatzki ring status post dilation/disruption.  Hiatal hernia.  [place] Kitchen EYE SURGERY    . INCONTINENCE SURGERY  08/26/09   [place]  .  JOINT REPLACEMENT     right total knee  . MALONEY DILATION N/A 09/16/2014   Procedure: Venia Minks DILATION;  Surgeon: Daneil Dolin, MD;  [facility];  Service: Endoscopy;  Laterality: N/A;  . PARS PLANA VITRECTOMY W/ REPAIR OF MACULAR HOLE Left 01/19/2014  . SAVORY DILATION N/A 09/16/2014   Procedure: SAVORY DILATION;  Surgeon: Daneil Dolin, MD;  [facility];  Service: Endoscopy;  Laterality: N/A;  . TONSILLECTOMY    . TOTAL KNEE ARTHROPLASTY Right 05/13/07   Dr. Aline Brochure  . TRIGGER FINGER RELEASE Right  06/26/2018   Procedure: RIGHT INDEX FINGER TRIGGER FINGER/A-1 PULLEY RELEASE;  Surgeon: Carole Civil, MD;  Location: [facility];  Service: Orthopedics;  Laterality: Right;    Prior to Admission medications   Medication Sig Start Date End Date Taking? Authorizing Provider  acetaminophen (TYLENOL) 500 MG tablet Take 500 mg by mouth at bedtime.   Yes [provider]  amLODipine (NORVASC) 10 MG tablet TAKE 1 TABLET BY MOUTH ONCE A DAY. Patient taking differently: Take 10 mg by mouth daily.  07/21/18  Yes Fayrene Helper, MD  brimonidine-timolol (COMBIGAN) 0.2-0.5 % ophthalmic solution Place 1 drop into both eyes every 12 (twelve) hours.   Yes [provider]  cetirizine (ZYRTEC) 10 MG tablet TAKE 1 TABLET BY MOUTH ONCE A DAY. Patient taking differently: Take 10 mg by mouth daily.  07/21/18  Yes Fayrene Helper, MD  Cholecalciferol (VITAMIN D3) 2000 units TABS Take 2,000 Units by mouth at bedtime.   Yes [provider]  citalopram (CELEXA) 20 MG tablet TAKE 1 TABLET BY MOUTH ONCE A DAY. Patient taking differently: Take 20 mg by mouth daily.  07/21/18  Yes Fayrene Helper, MD  Cranberry 1000 MG CAPS Take 1,000 mg by mouth daily.   Yes [provider]  cycloSPORINE (RESTASIS) 0.05 % ophthalmic emulsion Place 1 drop into both eyes 2 (two) times daily.    Yes [provider]  diltiazem (CARDIZEM CD) 240 MG 24 hr capsule TAKE (1) CAPSULE BY MOUTH TWICE DAILY. Patient taking differently: Take 240 mg by mouth 2 (two) times daily.  07/21/18  Yes Fayrene Helper, MD  docusate sodium (COLACE) 100 MG capsule Take 100 mg by mouth at bedtime.   Yes [provider]  ezetimibe (ZETIA) 10 MG tablet Take 1 tablet (10 mg total) by mouth daily. 10/19/18 10/20/19 Yes Fayrene Helper, MD  ferrous sulfate 325 (65 FE) MG tablet Take 325 mg by mouth daily with breakfast.   Yes [provider]  Flaxseed, Linseed, (FLAXSEED OIL) 1000 MG CAPS  Take 1,000 mg by mouth daily.   Yes [provider]  fluticasone (FLONASE) 50 MCG/ACT nasal spray Place 2 sprays into both nostrils daily. Patient taking differently: Place 2 sprays into both nostrils daily as needed for allergies.  05/01/16  Yes Fayrene Helper, MD  gabapentin (NEURONTIN) 300 MG capsule TAKE (1) CAPSULE BY MOUTH AT BEDTIME. Patient taking differently: Take 300 mg by mouth 2 (two) times daily.  11/13/18  Yes Fayrene Helper, MD  hydrALAZINE (APRESOLINE) 25 MG tablet TAKE 1 TABLET BY MOUTH THREE TIMES A DAY. Patient taking differently: Take 25 mg by mouth 3 (three) times daily.  07/21/18  Yes Fayrene Helper, MD  HYDROcodone-acetaminophen [facility]) 10-325 MG tablet Take one tablet two times daily for back pain 11/27/18 12/27/18 Yes Fayrene Helper, MD  HYDROcodone-acetaminophen [facility]) 10-325 MG tablet Take one tablet two times daily  for back pain 12/27/18 01/26/19 Yes Fayrene Helper, MD  LANTUS SOLOSTAR 100 UNIT/ML Solostar Pen INJECT 30 UNITS SUBCUTANEOUSLY ONCE DAILY. Patient taking differently: Inject 30 Units into the skin daily.  10/27/18  Yes Fayrene Helper, MD  losartan (COZAAR) 100 MG tablet TAKE (1) TABLET BY MOUTH DAILY. Patient taking differently: Take 100 mg by mouth daily.  07/21/18  Yes Fayrene Helper, MD  Multiple Vitamin (MULTIVITAMIN WITH MINERALS) TABS tablet Take 1 tablet by mouth at bedtime.   Yes [provider]  ondansetron (ZOFRAN) 4 MG tablet TAKE (1) TABLET BY MOUTH EVERY FOUR HOURS AS NEEDED FOR NAUSEA. Patient taking differently: Take 4 mg by mouth every 4 (four) hours as needed for nausea or vomiting. TAKE (1) TABLET BY MOUTH EVERY FOUR HOURS AS NEEDED FOR NAUSEA. 11/05/17  Yes Fayrene Helper, MD  pantoprazole (PROTONIX) 40 MG tablet Take 1 tablet (40 mg total) by mouth 2 (two) times daily before a meal. 11/03/18  Yes Mahala Menghini, PA-C  polyethylene glycol powder (GLYCOLAX/MIRALAX) powder MIX 17G (1 CAPFUL)  IN 8 OUNCES OF JUICE OR WATER AND DRINK ONCE DAILY. Patient taking differently: Take 1 Container by mouth daily.  06/17/17  Yes Fayrene Helper, MD  predniSONE (DELTASONE) 5 MG tablet TAKE 1 TABLET BY MOUTH ONCE DAILY. 11/13/18  Yes Carole Civil, MD  PROAIR HFA 108 [phone] Base) MCG/ACT inhaler INHALE 2 PUFFS BY MOUTH EVERY 6 HOURS AS NEEDED . Patient taking differently: Inhale 2 puffs into the lungs every 6 (six) hours as needed for wheezing or shortness of breath.  07/21/18  Yes Fayrene Helper, MD  pyridostigmine (MESTINON) 60 MG tablet TAKE (1) TABLET BY MOUTH THREE TIMES A DAY. Patient taking differently: Take 60 mg by mouth 3 (three) times daily. TAKE (1) TABLET BY MOUTH THREE TIMES A DAY. 11/13/18  Yes Fayrene Helper, MD  rosuvastatin (CRESTOR) 40 MG tablet TAKE (1) TABLET BY MOUTH AT BEDTIME. Patient taking differently: Take 40 mg by mouth at bedtime.  07/21/18  Yes Fayrene Helper, MD  solifenacin (VESICARE) 10 MG tablet TAKE 1 TABLET BY MOUTH ONCE A DAY. Patient taking differently: Take 10 mg by mouth daily.  11/13/18  Yes Fayrene Helper, MD  spironolactone (ALDACTONE) 25 MG tablet TAKE 1 TABLET BY MOUTH ONCE A DAY. Patient taking differently: Take 25 mg by mouth daily.  11/13/18  Yes Fayrene Helper, MD  TRADJENTA 5 MG TABS tablet TAKE 1 TABLET BY MOUTH ONCE A DAY. Patient taking differently: Take 5 mg by mouth daily.  11/13/18  Yes Fayrene Helper, MD  venlafaxine XR (EFFEXOR-XR) 150 MG 24 hr capsule TAKE 1 CAPSULE BY MOUTH DAILY. Patient taking differently: Take 150 mg by mouth daily with breakfast.  11/13/18  Yes Fayrene Helper, MD  B-D ULTRAFINE III SHORT PEN 31G X 8 MM MISC USE ONCE DAILY WITH LANTUS SOLOSTAR PEN. 01/07/18   Fayrene Helper, MD  ONETOUCH VERIO test strip USE AS DIRECTED TO TEST BLOOD GLUCOSE 3 TIMES DAILY. 03/18/17   Fayrene Helper, MD  predniSONE (DELTASONE) 5 MG tablet Take 1 tablet (5 mg total) by mouth daily. Patient  not taking: Reported on 11/21/2018 06/20/18   Carole Civil, MD  UNABLE TO FIND Diabetic shoes x 1 Inserts x 3 DX E11.9 07/21/18   Fayrene Helper, MD    Allergies as of 11/03/2018  . (No Known Allergies)    Family History  Problem Relation Age of  Onset  . Cancer Mother   . Heart disease Mother   . Cancer Father   . Heart disease Father   . Diabetes Sister   . Hypertension Brother   . Heart disease Brother   . Cancer Sister   . Kidney failure Sister   . Diabetes Son   . Hypertension Son   . Hypertension Daughter   . Colon cancer Neg Hx     Social History   Socioeconomic History  . Marital status: Single    Spouse name: Not on file  . Number of children: 2  . Years of education: Not on file  . Highest education level: Not on file  Occupational History  . Occupation: disabled    Employer: RETIRED  Social Needs  . Financial resource strain: Not very hard  . Food insecurity:    Worry: Never true    Inability: Never true  . Transportation needs:    Medical: Yes    Non-medical: No  Tobacco Use  . Smoking status: Former Smoker    Packs/day: 0.50    Years: 25.00    Pack years: 12.50    Types: Cigarettes    Last attempt to quit: 01/01/2004    Years since quitting: 14.9  . Smokeless tobacco: Never Used  Substance and Sexual Activity  . Alcohol use: No  . Drug use: No  . Sexual activity: Never    Birth control/protection: Surgical  Lifestyle  . Physical activity:    Days per week: 0 days    Minutes per session: 0 min  . Stress: Only a little  Relationships  . Social connections:    Talks on phone: Not on file    Gets together: Not on file    Attends religious service: Not on file    Active member of club or organization: Not on file    Attends meetings of clubs or organizations: Not on file    Relationship status: Not on file  . Intimate partner violence:    Fear of current or ex partner: Not on file    Emotionally abused: Not on file     Physically abused: Not on file    Forced sexual activity: Not on file  Other Topics Concern  . Not on file  Social History Narrative   Patient lives at home by herself   Patient drinks one cup of coffee a day   Patient is right handed.    Review of Systems: See HPI, otherwise negative ROS  Physical Exam: BP (!) 157/65   Pulse 61   Temp 97.9 F (36.6 C) (Oral)   Resp 16   SpO2 99%  General:   Alert,  Well-developed, well-nourished, pleasant and cooperative in NAD Skin:  Intact without significant lesions or rashes. Neck:  Supple; no masses or thyromegaly. No significant cervical adenopathy. Lungs:  Clear throughout to auscultation.   No wheezes, crackles, or rhonchi. No acute distress. Heart:  Regular rate and rhythm; no murmurs, clicks, rubs,  or gallops. Abdomen: Non-distended, normal bowel sounds.  Soft and nontender without appreciable mass or hepatosplenomegaly.  Pulses:  Normal pulses noted. Extremities:  Without clubbing or edema.  Impression/Plan: 77 year old lady with worsening reflux symptoms and recurrent esophageal dysphagia.  Schatzki's ring dilated multiple times previously.  Needs an EGD today to further evaluate/manage.   Recommendations: I have offered the patient an EGD/ED as feasible/appropriate per plan today.  The risks, benefits, limitations, alternatives and imponderables have been reviewed with the patient. Potential for  esophageal dilation, biopsy, etc. have also been reviewed.  Questions have been answered. All parties agreeable.    Notice: This dictation was prepared with Dragon dictation along with smaller phrase technology. Any transcriptional errors that result from this process are unintentional and may not be corrected upon review.

## 2018-12-10 ENCOUNTER — Ambulatory Visit (HOSPITAL_COMMUNITY): Payer: Medicare Other | Admitting: Internal Medicine

## 2018-12-10 ENCOUNTER — Inpatient Hospital Stay (HOSPITAL_COMMUNITY): Payer: Medicare Other | Attending: Hematology

## 2018-12-10 DIAGNOSIS — Z87891 Personal history of nicotine dependence: Secondary | ICD-10-CM | POA: Diagnosis not present

## 2018-12-10 DIAGNOSIS — E1122 Type 2 diabetes mellitus with diabetic chronic kidney disease: Secondary | ICD-10-CM | POA: Insufficient documentation

## 2018-12-10 DIAGNOSIS — D631 Anemia in chronic kidney disease: Secondary | ICD-10-CM | POA: Diagnosis not present

## 2018-12-10 DIAGNOSIS — D508 Other iron deficiency anemias: Secondary | ICD-10-CM

## 2018-12-10 DIAGNOSIS — D472 Monoclonal gammopathy: Secondary | ICD-10-CM | POA: Diagnosis not present

## 2018-12-10 DIAGNOSIS — N183 Chronic kidney disease, stage 3 (moderate): Secondary | ICD-10-CM | POA: Insufficient documentation

## 2018-12-10 DIAGNOSIS — G8929 Other chronic pain: Secondary | ICD-10-CM | POA: Insufficient documentation

## 2018-12-10 DIAGNOSIS — M25512 Pain in left shoulder: Secondary | ICD-10-CM | POA: Insufficient documentation

## 2018-12-10 DIAGNOSIS — D509 Iron deficiency anemia, unspecified: Secondary | ICD-10-CM | POA: Insufficient documentation

## 2018-12-10 DIAGNOSIS — I129 Hypertensive chronic kidney disease with stage 1 through stage 4 chronic kidney disease, or unspecified chronic kidney disease: Secondary | ICD-10-CM | POA: Diagnosis not present

## 2018-12-10 DIAGNOSIS — Z79899 Other long term (current) drug therapy: Secondary | ICD-10-CM | POA: Diagnosis not present

## 2018-12-10 DIAGNOSIS — G7000 Myasthenia gravis without (acute) exacerbation: Secondary | ICD-10-CM | POA: Insufficient documentation

## 2018-12-10 DIAGNOSIS — R1084 Generalized abdominal pain: Secondary | ICD-10-CM | POA: Diagnosis not present

## 2018-12-10 DIAGNOSIS — E039 Hypothyroidism, unspecified: Secondary | ICD-10-CM | POA: Insufficient documentation

## 2018-12-10 LAB — CBC WITH DIFFERENTIAL/PLATELET
Abs Immature Granulocytes: 0.02 10*3/uL (ref 0.00–0.07)
BASOS ABS: 0 10*3/uL (ref 0.0–0.1)
Basophils Relative: 0 %
Eosinophils Absolute: 0.4 10*3/uL (ref 0.0–0.5)
Eosinophils Relative: 6 %
HCT: 32.5 % — ABNORMAL LOW (ref 36.0–46.0)
Hemoglobin: 9.6 g/dL — ABNORMAL LOW (ref 12.0–15.0)
Immature Granulocytes: 0 %
Lymphocytes Relative: 25 %
Lymphs Abs: 1.8 10*3/uL (ref 0.7–4.0)
MCH: 28.3 pg (ref 26.0–34.0)
MCHC: 29.5 g/dL — ABNORMAL LOW (ref 30.0–36.0)
MCV: 95.9 fL (ref 80.0–100.0)
Monocytes Absolute: 0.8 10*3/uL (ref 0.1–1.0)
Monocytes Relative: 11 %
NRBC: 0 % (ref 0.0–0.2)
Neutro Abs: 4.2 10*3/uL (ref 1.7–7.7)
Neutrophils Relative %: 58 %
PLATELETS: 233 10*3/uL (ref 150–400)
RBC: 3.39 MIL/uL — ABNORMAL LOW (ref 3.87–5.11)
RDW: 16.7 % — ABNORMAL HIGH (ref 11.5–15.5)
WBC: 7.3 10*3/uL (ref 4.0–10.5)

## 2018-12-10 LAB — COMPREHENSIVE METABOLIC PANEL
ALT: 16 U/L (ref 0–44)
AST: 15 U/L (ref 15–41)
Albumin: 3.5 g/dL (ref 3.5–5.0)
Alkaline Phosphatase: 170 U/L — ABNORMAL HIGH (ref 38–126)
Anion gap: 6 (ref 5–15)
BUN: 19 mg/dL (ref 8–23)
CHLORIDE: 109 mmol/L (ref 98–111)
CO2: 26 mmol/L (ref 22–32)
Calcium: 8.4 mg/dL — ABNORMAL LOW (ref 8.9–10.3)
Creatinine, Ser: 1.11 mg/dL — ABNORMAL HIGH (ref 0.44–1.00)
GFR calc non Af Amer: 49 mL/min — ABNORMAL LOW (ref >60)
GFR, EST AFRICAN AMERICAN: 56 mL/min — AB (ref >60)
Glucose, Bld: 141 mg/dL — ABNORMAL HIGH (ref 70–99)
POTASSIUM: 4.2 mmol/L (ref 3.5–5.1)
Sodium: 141 mmol/L (ref 135–145)
Total Bilirubin: 0.5 mg/dL (ref 0.3–1.2)
Total Protein: 6.9 g/dL (ref 6.5–8.1)

## 2018-12-10 LAB — VITAMIN B12: Vitamin B-12: 703 pg/mL (ref 180–914)

## 2018-12-10 LAB — FERRITIN: Ferritin: 51 ng/mL (ref 11–307)

## 2018-12-10 LAB — LACTATE DEHYDROGENASE: LDH: 196 U/L — ABNORMAL HIGH (ref 98–192)

## 2018-12-10 LAB — FOLATE: Folate: 19.4 ng/mL (ref >5.9)

## 2018-12-11 LAB — HAPTOGLOBIN: Haptoglobin: 138 mg/dL (ref 34–200)

## 2018-12-12 ENCOUNTER — Encounter (HOSPITAL_COMMUNITY): Payer: Self-pay | Admitting: Internal Medicine

## 2018-12-15 LAB — HEMOGLOBINOPATHY EVALUATION
Hgb A2 Quant: 2.1 % (ref 1.8–3.2)
Hgb A: 97.9 % (ref 96.4–98.8)
Hgb C: 0 %
Hgb F Quant: 0 % (ref 0.0–2.0)
Hgb S Quant: 0 %
Hgb Variant: 0 %

## 2018-12-16 ENCOUNTER — Encounter: Payer: Self-pay | Admitting: Family Medicine

## 2018-12-16 ENCOUNTER — Inpatient Hospital Stay (HOSPITAL_BASED_OUTPATIENT_CLINIC_OR_DEPARTMENT_OTHER): Payer: Medicare Other | Admitting: Internal Medicine

## 2018-12-16 ENCOUNTER — Other Ambulatory Visit: Payer: Self-pay | Admitting: Family Medicine

## 2018-12-16 ENCOUNTER — Ambulatory Visit (INDEPENDENT_AMBULATORY_CARE_PROVIDER_SITE_OTHER): Payer: Medicare Other | Admitting: Family Medicine

## 2018-12-16 ENCOUNTER — Encounter (HOSPITAL_COMMUNITY): Payer: Self-pay | Admitting: Internal Medicine

## 2018-12-16 ENCOUNTER — Other Ambulatory Visit: Payer: Self-pay | Admitting: Orthopedic Surgery

## 2018-12-16 VITALS — BP 144/52 | HR 69 | Resp 12 | Ht 66.0 in | Wt 264.0 lb

## 2018-12-16 VITALS — BP 159/46 | HR 65 | Temp 98.0°F | Resp 20 | Wt 267.0 lb

## 2018-12-16 DIAGNOSIS — Z79899 Other long term (current) drug therapy: Secondary | ICD-10-CM | POA: Diagnosis not present

## 2018-12-16 DIAGNOSIS — E119 Type 2 diabetes mellitus without complications: Secondary | ICD-10-CM

## 2018-12-16 DIAGNOSIS — D472 Monoclonal gammopathy: Secondary | ICD-10-CM

## 2018-12-16 DIAGNOSIS — R634 Abnormal weight loss: Secondary | ICD-10-CM

## 2018-12-16 DIAGNOSIS — Z794 Long term (current) use of insulin: Secondary | ICD-10-CM

## 2018-12-16 DIAGNOSIS — G8929 Other chronic pain: Secondary | ICD-10-CM | POA: Diagnosis not present

## 2018-12-16 DIAGNOSIS — I129 Hypertensive chronic kidney disease with stage 1 through stage 4 chronic kidney disease, or unspecified chronic kidney disease: Secondary | ICD-10-CM

## 2018-12-16 DIAGNOSIS — N183 Chronic kidney disease, stage 3 (moderate): Secondary | ICD-10-CM | POA: Diagnosis not present

## 2018-12-16 DIAGNOSIS — E1122 Type 2 diabetes mellitus with diabetic chronic kidney disease: Secondary | ICD-10-CM

## 2018-12-16 DIAGNOSIS — Z0000 Encounter for general adult medical examination without abnormal findings: Secondary | ICD-10-CM

## 2018-12-16 DIAGNOSIS — D631 Anemia in chronic kidney disease: Secondary | ICD-10-CM | POA: Diagnosis not present

## 2018-12-16 DIAGNOSIS — M25512 Pain in left shoulder: Secondary | ICD-10-CM | POA: Diagnosis not present

## 2018-12-16 DIAGNOSIS — J0100 Acute maxillary sinusitis, unspecified: Secondary | ICD-10-CM

## 2018-12-16 DIAGNOSIS — D508 Other iron deficiency anemias: Secondary | ICD-10-CM

## 2018-12-16 DIAGNOSIS — D509 Iron deficiency anemia, unspecified: Secondary | ICD-10-CM

## 2018-12-16 DIAGNOSIS — Z87891 Personal history of nicotine dependence: Secondary | ICD-10-CM | POA: Diagnosis not present

## 2018-12-16 DIAGNOSIS — R1084 Generalized abdominal pain: Secondary | ICD-10-CM

## 2018-12-16 DIAGNOSIS — IMO0001 Reserved for inherently not codable concepts without codable children: Secondary | ICD-10-CM

## 2018-12-16 DIAGNOSIS — G7000 Myasthenia gravis without (acute) exacerbation: Secondary | ICD-10-CM | POA: Diagnosis not present

## 2018-12-16 DIAGNOSIS — I10 Essential (primary) hypertension: Secondary | ICD-10-CM

## 2018-12-16 DIAGNOSIS — E039 Hypothyroidism, unspecified: Secondary | ICD-10-CM | POA: Diagnosis not present

## 2018-12-16 NOTE — Patient Instructions (Addendum)
F/U  Week of March 25 , call if you need me before  Pain medication is prescribed  Reduce lantus to 25 units, if sugar still falling reduce to 20 units  HBa1C., cmp and EGFr,  TSH 2nd week in Feb     It is important that you exercise regularly at least 30 minutes 5 times a week. If you develop chest pain, have severe difficulty breathing, or feel very tired, stop exercising immediately and seek medical attention

## 2018-12-16 NOTE — Progress Notes (Signed)
11/28/2018  1   10/19/2018  Hydrocodone-Acetamin 10-325 Mg  60.00 30 Ma Sim  [id number]  Rxc (4696)  0/0 20.00 MME Medicare  [place]  10/28/2018  1   10/19/2018  Hydrocodone-Acetamin 10-325 Mg  60.00 30 Ma Sim  [id number]  Rxc (2440)  0/0 20.00 MME Medicare  [place]  09/29/2018  1   07/17/2018  Hydrocodone-Acetamin 10-325 Mg  60.00 77 Ma Sim  [id number]  Rxc (6440)  0/0 20.00 MME Medicare  [place]  08/28/2018  1   07/17/2018  Hydrocodone-Acetamin 10-325 Mg  60.00 60 Ma Sim  [id number]  Rxc (6387)  0/0 20.00 MME Medicare  [place]  07/28/2018  1   07/17/2018  Hydrocodone-Acetamin 10-325 Mg  60.00 30 Ma Sim  [id number]  Rxc (1884)  0/0 20.00 MME Medicare  [place]  06/30/2018  1   03/31/2018  Hydrocodone-Acetamin 10-325 Mg  60.00 30 Ma Sim  [id number]  Rxc (6010)  0/0 20.00 MME Medicare  [place]  06/02/2018  1   05/03/2018  Hydrocodone-Acetamin 10-325 Mg  60.00 30 Ma Sim  [id number]  Rxc (9167)  0/0 20.00 MME Medicare  [place]  05/02/2018  1   01/07/2018  Hydrocodone-Acetamin 10-325 Mg  60.00 63 Ma Sim  [id number]  Rxc (0623)  0/0 20.00 MME Medicare  [place]  04/01/2018  1   04/01/2018  Hydrocodone-Acetamin 10-325 Mg  60.00 32 Ma Sim  [id number]  Rxc (7616)  0/0 20.00 MME Medicare  [place]  03/03/2018  1   01/07/2018  Hydrocodone-Acetamin 10-325 Mg  60.00 88 Ma Sim  [id number]  Rxc (6948)  0/0 20.00 MME Medicare  [place]  02/04/2018  1   01/07/2018  Hydrocodone-Acetamin 10-325 Mg  60.00 35 Ma Sim  [id number]  Rxc (0093)  0/0 20.00 MME Medicare  [place]  01/03/2018  1   11/06/2017  Hydrocodone-Acetamin 10-325 Mg  60.00 59 Ma Sim  [id number]  Rxc (9167)  0/0 20.00 MME Medicare  N

## 2018-12-16 NOTE — Progress Notes (Signed)
Diagnosis Generalized abdominal pain - Plan: CBC with Differential/Platelet, Comprehensive metabolic panel, Lactate dehydrogenase, Ferritin  Abnormal weight loss - Plan: CT ABDOMEN PELVIS W CONTRAST  Other iron deficiency anemia - Plan: CBC with Differential/Platelet, Comprehensive metabolic panel, Lactate dehydrogenase, Ferritin  Staging Cancer Staging No matching staging information was found for the patient.  Assessment and Plan:   1. Iron deficiency anemia.  Pt was last treated with IV iron 07/2018.   Labs done 12/10/2018 reviewed and showed WBC 7.3 HB 9.6 plts 233,000.  Chemistries WNL with K+ 4.2 Cr 1.1 and normal LFTs.  Ferritin is 51. Pt will be set up for CT abdomen and pelvis due to ongoing GI complaints.  She will follow-up to go over results.    2.  GI symptoms.  Pt is reporting trouble swallowing, constipation and nausea. EGD in 2015 showed Schatzki's ring - status post dilation and Hiatal hernia.  Pt continues to report abdominal pain.  She is set up for CT abdomen and pelvis for further evaluation.  She will follow-up to go over results.    3. IgA kappa monoclonal gammopathy. Immunofixation demonstrated an IgA kappa monoclonal gammopathy. She was given option of bone marrow biopsy by Dr. Talbert Cage in the past but did not desire to have procedure done.  Pt had SPEP done 09/15/2018 that was reviewed and was negative. FLC ratio is 1.54.  She has normal quantitative immunoglobulins.  Will repeat SPEP on RTC  4.  HTN.  BP is 159/46.  Follow-up with PCP.    5.  DM.  Follow-up with PCP for monitoring.    6  Health maintenance.  Follow-up with GI as recommended.    25 minutes spent with more than 50% spent in counseling and coordination of care.    Current Status:  Pt is seen today for follow-up.  She is here to go over labs.  She continues to reports GI symptoms.    Problem List Patient Active Problem List   Diagnosis Date Noted  . Nausea with vomiting [R11.2] 11/03/2018  . S/P  trigger finger release 06/26/18 [Z98.890] 07/09/2018  . Trigger finger, right index finger [M65.321]   . 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 Memory loss of unknown cause [R41.3] 11/10/2017  . Chronic left shoulder pain [M25.512, G89.29] 09/14/2017  . Encounter for chronic pain management [G89.29] 02/09/2017  . 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 Morbid obesity ([place]) [E66.01] 04/18/2015  . Myasthenia gravis in remission ([place]) [G70.00] 11/24/2014  . Myasthenia gravis ([place]) [G70.00] 11/16/2014  . Vitamin D deficiency [E55.9] 05/24/2014  . Osteopenia [M85.80] 04/26/2014  . 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 Esophageal dysphagia [R13.10] 04/03/2012  . Abnormal chest CT [R93.89] 03/02/2012  . Insomnia [G47.00] 01/02/2012  . Essential hypertension, benign [I10] 05/30/2011  . Schatzki's ring [K22.2] 05/07/2011  . INGROWN TOENAIL [L60.0] 01/10/2011  . GLAUCOMA [H40.9] 08/08/2010  . DYSCHROMIA, UNSPECIFIED [L81.9] 08/08/2010  . Chronic renal disease, stage 3, moderately decreased glomerular filtration rate (GFR) between 30-59 mL/min/1.73 square meter ([place]) [N18.3] 09/14/2009  . HIP PAIN, LEFT [M25.559] 06/09/2009  . Urinary incontinence [R32] 06/09/2009  . Iron deficiency anemia [D50.9] 10/02/2008  . Hypothyroidism [E03.9] 09/27/2008  . Hyperlipemia [E78.5] 06/30/2008  . Depression with anxiety [F41.8] 06/30/2008  . GERD [K21.9] 06/30/2008  . Obstructive sleep apnea [G47.33] 06/30/2008  . CARPAL TUNNEL SYNDROME [G56.00] 05/19/2008  . SHOULDER PAIN [M25.519] 02/02/2008  . IMPINGEMENT  SYNDROME [M75.80] 02/02/2008  . 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 Diabetes mellitus, insulin dependent (IDDM), controlled ([place]) [E11.9, Z79.4] 09/09/2007    Past Medical History Past Medical History:  Diagnosis Date  . Anemia in chronic renal disease 12/30/2015   . Anxiety   . Arthritis    "all over" (01/19/2014)  . Arthritis, lumbar spine   . Carpal tunnel syndrome   . Chronic bronchitis ([place])    "got it q yr for awhile; hasn't had it in awhile" (01/19/2014)  . Chronic kidney disease (CKD) stage G3b/A1, moderately decreased glomerular filtration rate (GFR) between 30-44 mL/min/1.73 square meter and albuminuria creatinine ratio less than 30 mg/g (HCC) 09/14/2009   Qualifier: Diagnosis of  By: Moshe Cipro MD, Joycelyn Schmid    . Chronic renal disease, stage 3, moderately decreased glomerular filtration rate (GFR) between 30-59 mL/min/1.73 square meter (HCC) 09/14/2009   Qualifier: Diagnosis of  By: Moshe Cipro MD, Joycelyn Schmid    . Complication of anesthesia    combative  . Depression   . Diverticulosis 07/2004   Colonscopy Dr Gala Romney  . Esophagitis, erosive 2009  . Exertional shortness of breath   . Gastroesophageal reflux   . HQPRFFMB(846.6)    "usually a couple times/wk" (01/19/2014)  . Heart murmur    saw cardiology [place], he told her she did not need to come back.  . Hyperlipidemia   . Hypertension   . Impingement syndrome, shoulder   . Iron deficiency anemia   . Mitral regurgitation   . Myasthenia gravis   . Myasthenia gravis in remission ([place]) 11/24/2014  . Obesity   . OSA on CPAP   . Pneumonia 01/2012  . Pulmonary HTN ([place])   . Schatzki's ring    Last EGD w/ dilation 02/08/11, 2009 & 2007  . Seasonal allergies   . Shoulder pain   . Thyroid disease    "used to take RX; they took me off it" (01/19/2014)  . Tobacco abuse   . Type II diabetes mellitus ([place])     Past Surgical History Past Surgical History:  Procedure Laterality Date  . [place] VITRECTOMY WITH 20 GAUGE MVR PORT Left 01/19/2014   Procedure: 25 GAUGE PARS PLANA VITRECTOMY WITH 20 GAUGE MVR PORT; MEMBRAME PEEL; SERUM PATCH; LASER TREATMENT; C3F8;  Surgeon: Hayden Pedro, MD;  Location: [facility];  Service: Ophthalmology;  Laterality: Left;  . ABDOMINAL HYSTERECTOMY    .  APPENDECTOMY    . CARPAL TUNNEL RELEASE Bilateral   . CATARACT EXTRACTION W/PHACO Left 10/21/2013   Procedure: LEFT CATARACT EXTRACTION PHACO AND INTRAOCULAR LENS PLACEMENT (IOC);  Surgeon: Marylynn Pearson, MD;  Location: [facility];  Service: Ophthalmology;  Laterality: Left;  . CHOLECYSTECTOMY    . COLONOSCOPY  05/08/2012   [id number] and external hemorrhoids; colonic diverticulosis  . ESOPHAGEAL DILATION     "more than 3 times" (01/19/2014)  . ESOPHAGOGASTRODUODENOSCOPY  02/08/11   Rourk-Distal esophageal erosion consistent with mild erosive reflux   esophagitis/ Noncritical Schatzki ring, small hiatal hernia otherwise upper/ gastrointestinal tract appeared unremarkable, status post passage  of a Maloney dilation to biopsy disruption of the ring described  . ESOPHAGOGASTRODUODENOSCOPY  04/2012   2 tandem incomplete distal esophagea rings s/p dilation.   . ESOPHAGOGASTRODUODENOSCOPY N/A 09/16/2014   Dr. Gala Romney: Schatzki ring status post dilation/disruption.  Hiatal hernia.  Marland Kitchen ESOPHAGOGASTRODUODENOSCOPY (EGD) WITH PROPOFOL N/A 12/08/2018   Procedure: ESOPHAGOGASTRODUODENOSCOPY (EGD) WITH PROPOFOL;  Surgeon: Daneil Dolin, MD;  [facility];  Service: Endoscopy;  Laterality: N/A;  1:15pm  . EYE SURGERY    . INCONTINENCE SURGERY  08/26/09   [place]  . JOINT REPLACEMENT     right total knee  . MALONEY DILATION N/A 09/16/2014   Procedure: MALONEY DILATION;  Surgeon: Robert M Rourk, MD;  [facility];  Service: Endoscopy;  Laterality: N/A;  . MALONEY DILATION N/A 12/08/2018   Procedure: MALONEY DILATION;  Surgeon: Rourk, Robert M, MD;  [facility];  Service: Endoscopy;  Laterality: N/A;  . PARS PLANA VITRECTOMY W/ REPAIR OF MACULAR HOLE Left 01/19/2014  . SAVORY DILATION N/A 09/16/2014   Procedure: SAVORY DILATION;  Surgeon: Robert M Rourk, MD;  [facility];  Service: Endoscopy;  Laterality: N/A;  . TONSILLECTOMY    . TOTAL KNEE ARTHROPLASTY Right 05/13/07    Dr. Harrison  . TRIGGER FINGER RELEASE Right 06/26/2018   Procedure: RIGHT INDEX FINGER TRIGGER FINGER/A-1 PULLEY RELEASE;  Surgeon: Harrison, Stanley E, MD;  Location[facility];  Service: Orthopedics;  Laterality: Right;    Family History Family History  Problem Relation Age of Onset  . Cancer Mother   . Heart disease Mother   . Cancer Father   . Heart disease Father   . Diabetes Sister   . Hypertension Brother   . Heart disease Brother   . Cancer Sister   . Kidney failure Sister   . Diabetes Son   . Hypertension Son   . Hypertension Daughter   . Colon cancer Neg Hx      Social History  reports that she quit smoking about 14 years ago. Her smoking use included cigarettes. She has a 12.50 pack-year smoking history. She has never used smokeless tobacco. She reports that she does not drink alcohol or use drugs.  Medications  Current Outpatient Medications:  .  acetaminophen (TYLENOL) 500 MG tablet, Take 500 mg by mouth at bedtime., Disp: , Rfl:  .  B-D ULTRAFINE III SHORT PEN 31G X 8 MM MISC, USE ONCE DAILY WITH LANTUS SOLOSTAR PEN., Disp: 100 each, Rfl: 0 .  brimonidine-timolol (COMBIGAN) 0.2-0.5 % ophthalmic solution, Place 1 drop into both eyes every 12 (twelve) hours., Disp: , Rfl:  .  cetirizine (ZYRTEC) 10 MG tablet, TAKE 1 TABLET BY MOUTH ONCE A DAY. (Patient taking differently: Take 10 mg by mouth daily. ), Disp: 30 tablet, Rfl: 5 .  Cholecalciferol (VITAMIN D3) 2000 units TABS, Take 2,000 Units by mouth at bedtime., Disp: , Rfl:  .  citalopram (CELEXA) 20 MG tablet, TAKE 1 TABLET BY MOUTH ONCE A DAY. (Patient taking differently: Take 20 mg by mouth daily. ), Disp: 30 tablet, Rfl: 5 .  Cranberry 1000 MG CAPS, Take 1,000 mg by mouth daily., Disp: , Rfl:  .  cycloSPORINE (RESTASIS) 0.05 % ophthalmic emulsion, Place 1 drop into both eyes 2 (two) times daily. , Disp: , Rfl:  .  diltiazem (CARDIZEM CD) 240 MG 24 hr capsule, TAKE (1) CAPSULE BY MOUTH TWICE DAILY. (Patient taking  differently: Take 240 mg by mouth 2 (two) times daily. ), Disp: 60 capsule, Rfl: 5 .  docusate sodium (COLACE) 100 MG capsule, Take 100 mg by mouth at bedtime., Disp: , Rfl:  .  ezetimibe (ZETIA) 10 MG tablet, Take 1 tablet (10 mg total) by mouth daily., Disp: 30 tablet, Rfl: 5 .  ferrous sulfate 325 (65 FE) MG tablet, Take 325 mg by mouth daily with breakfast., Disp: , Rfl:  .  Flaxseed, Linseed, (FLAXSEED OIL) 1000 MG CAPS,   Take 1,000 mg by mouth daily., Disp: , Rfl:  .  fluticasone (FLONASE) 50 MCG/ACT nasal spray, Place 2 sprays into both nostrils daily. (Patient taking differently: Place 2 sprays into both nostrils daily as needed for allergies. ), Disp: 48 g, Rfl: 1 .  gabapentin (NEURONTIN) 300 MG capsule, TAKE (1) CAPSULE BY MOUTH AT BEDTIME. (Patient taking differently: Take 300 mg by mouth 2 (two) times daily. ), Disp: 30 capsule, Rfl: 0 .  hydrALAZINE (APRESOLINE) 25 MG tablet, TAKE 1 TABLET BY MOUTH THREE TIMES A DAY. (Patient taking differently: Take 25 mg by mouth 3 (three) times daily. ), Disp: 90 tablet, Rfl: 5 .  LANTUS SOLOSTAR 100 UNIT/ML Solostar Pen, INJECT 30 UNITS SUBCUTANEOUSLY ONCE DAILY. (Patient taking differently: Inject 30 Units into the skin daily. ), Disp: 15 mL, Rfl: 5 .  losartan (COZAAR) 100 MG tablet, TAKE (1) TABLET BY MOUTH DAILY. (Patient taking differently: Take 100 mg by mouth daily. ), Disp: 30 tablet, Rfl: 5 .  Multiple Vitamin (MULTIVITAMIN WITH MINERALS) TABS tablet, Take 1 tablet by mouth at bedtime., Disp: , Rfl:  .  ondansetron (ZOFRAN) 4 MG tablet, TAKE (1) TABLET BY MOUTH EVERY FOUR HOURS AS NEEDED FOR NAUSEA. (Patient taking differently: Take 4 mg by mouth every 4 (four) hours as needed for nausea or vomiting. TAKE (1) TABLET BY MOUTH EVERY FOUR HOURS AS NEEDED FOR NAUSEA.), Disp: 30 tablet, Rfl: 0 .  ONETOUCH VERIO test strip, USE AS DIRECTED TO TEST BLOOD GLUCOSE 3 TIMES DAILY., Disp: 100 each, Rfl: 11 .  pantoprazole (PROTONIX) 40 MG tablet, Take 1  tablet (40 mg total) by mouth 2 (two) times daily before a meal., Disp: 60 tablet, Rfl: 5 .  polyethylene glycol powder (GLYCOLAX/MIRALAX) powder, MIX 17G (1 CAPFUL) IN 8 OUNCES OF JUICE OR WATER AND DRINK ONCE DAILY. (Patient taking differently: Take 1 Container by mouth daily. ), Disp: 527 g, Rfl: 3 .  predniSONE (DELTASONE) 5 MG tablet, TAKE 1 TABLET BY MOUTH ONCE DAILY., Disp: 30 tablet, Rfl: 0 .  PROAIR HFA 108 (90 Base) MCG/ACT inhaler, INHALE 2 PUFFS BY MOUTH EVERY 6 HOURS AS NEEDED . (Patient taking differently: Inhale 2 puffs into the lungs every 6 (six) hours as needed for wheezing or shortness of breath. ), Disp: 8.5 g, Rfl: 5 .  pyridostigmine (MESTINON) 60 MG tablet, TAKE (1) TABLET BY MOUTH THREE TIMES A DAY. (Patient taking differently: Take 60 mg by mouth 3 (three) times daily. TAKE (1) TABLET BY MOUTH THREE TIMES A DAY.), Disp: 90 tablet, Rfl: 0 .  rosuvastatin (CRESTOR) 40 MG tablet, TAKE (1) TABLET BY MOUTH AT BEDTIME. (Patient taking differently: Take 40 mg by mouth at bedtime. ), Disp: 30 tablet, Rfl: 5 .  solifenacin (VESICARE) 10 MG tablet, TAKE 1 TABLET BY MOUTH ONCE A DAY. (Patient taking differently: Take 10 mg by mouth daily. ), Disp: 30 tablet, Rfl: 0 .  spironolactone (ALDACTONE) 25 MG tablet, TAKE 1 TABLET BY MOUTH ONCE A DAY. (Patient taking differently: Take 25 mg by mouth daily. ), Disp: 30 tablet, Rfl: 0 .  TRADJENTA 5 MG TABS tablet, TAKE 1 TABLET BY MOUTH ONCE A DAY. (Patient taking differently: Take 5 mg by mouth daily. ), Disp: 30 tablet, Rfl: 0 .  UNABLE TO FIND, Diabetic shoes x 1 Inserts x 3 DX E11.9, Disp: 1 each, Rfl: 0 .  venlafaxine XR (EFFEXOR-XR) 150 MG 24 hr capsule, TAKE 1 CAPSULE BY MOUTH DAILY. (Patient taking differently: Take 150 mg by mouth  daily with breakfast. ), Disp: 30 capsule, Rfl: 0  Allergies Patient has no known allergies.  Review of Systems Review of Systems - Oncology ROS negative other than GI complaints.     Physical  Exam  Vitals Wt Readings from Last 3 Encounters:  12/16/18 267 lb (121.1 kg)  12/01/18 257 lb (116.6 kg)  11/06/18 259 lb (117.5 kg)   Temp Readings from Last 3 Encounters:  12/16/18 98 F (36.7 C) (Oral)  12/08/18 97.9 F (36.6 C) (Oral)  12/01/18 98.6 F (37 C) (Oral)   BP Readings from Last 3 Encounters:  12/16/18 (!) 159/46  12/08/18 (!) 188/67  12/01/18 (!) 172/65   Pulse Readings from Last 3 Encounters:  12/16/18 65  12/08/18 63  12/01/18 (!) 58   Constitutional: Well-developed, well-nourished, and in no distress.   HENT: Head: Normocephalic and atraumatic.  Mouth/Throat: No oropharyngeal exudate. Mucosa moist. Eyes: Pupils are equal, round, and reactive to light. Conjunctivae are normal. No scleral icterus.  Neck: Normal range of motion. Neck supple. No JVD present.  Cardiovascular: Normal rate, regular rhythm and normal heart sounds.  Exam reveals no gallop and no friction rub.   No murmur heard. Pulmonary/Chest: Effort normal and breath sounds normal. No respiratory distress. No wheezes.No rales.  Abdominal: Soft. Bowel sounds are normal. Obese.  Tender to palpation epigastric region  No guarding.  Musculoskeletal: No edema or tenderness.  Lymphadenopathy: No cervical, axillary or supraclavicular adenopathy.  Neurological: Alert and oriented to person, place, and time. No cranial nerve deficit.  Skin: Skin is warm and dry. No rash noted. No erythema. No pallor.  Psychiatric: Affect and judgment normal.   Labs No visits with results within 3 Day(s) from this visit.  Latest known visit with results is:  Appointment on 12/10/2018  Component Date Value Ref Range Status  . WBC 12/10/2018 7.3  4.0 - 10.5 K/uL Final  . RBC 12/10/2018 3.39* 3.87 - 5.11 MIL/uL Final  . Hemoglobin 12/10/2018 9.6* 12.0 - 15.0 g/dL Final  . HCT 12/10/2018 32.5* 36.0 - 46.0 % Final  . MCV 12/10/2018 95.9  80.0 - 100.0 fL Final  . MCH 12/10/2018 28.3  26.0 - 34.0 pg Final  . MCHC  12/10/2018 29.5* 30.0 - 36.0 g/dL Final  . RDW 12/10/2018 16.7* 11.5 - 15.5 % Final  . Platelets 12/10/2018 233  150 - 400 K/uL Final  . nRBC 12/10/2018 0.0  0.0 - 0.2 % Final  . Neutrophils Relative % 12/10/2018 58  % Final  . Neutro Abs 12/10/2018 4.2  1.7 - 7.7 K/uL Final  . Lymphocytes Relative 12/10/2018 25  % Final  . Lymphs Abs 12/10/2018 1.8  0.7 - 4.0 K/uL Final  . Monocytes Relative 12/10/2018 11  % Final  . Monocytes Absolute 12/10/2018 0.8  0.1 - 1.0 K/uL Final  . Eosinophils Relative 12/10/2018 6  % Final  . Eosinophils Absolute 12/10/2018 0.4  0.0 - 0.5 K/uL Final  . Basophils Relative 12/10/2018 0  % Final  . Basophils Absolute 12/10/2018 0.0  0.0 - 0.1 K/uL Final  . Immature Granulocytes 12/10/2018 0  % Final  . Abs Immature Granulocytes 12/10/2018 0.02  0.00 - 0.07 K/uL Final   Performed at [facility], [address] [place], [place] [address]  . Sodium 12/10/2018 141  135 - 145 mmol/L Final  . Potassium 12/10/2018 4.2  3.5 - 5.1 mmol/L Final  . Chloride 12/10/2018 109  98 - 111 mmol/L Final  . CO2 12/10/2018 26  22 -   32 mmol/L Final  . Glucose, Bld 12/10/2018 141* 70 - 99 mg/dL Final  . BUN 12/10/2018 19  8 - 23 mg/dL Final  . Creatinine, Ser 12/10/2018 1.11* 0.44 - 1.00 mg/dL Final  . Calcium 12/10/2018 8.4* 8.9 - 10.3 mg/dL Final  . Total Protein 12/10/2018 6.9  6.5 - 8.1 g/dL Final  . Albumin 12/10/2018 3.5  3.5 - 5.0 g/dL Final  . AST 12/10/2018 15  15 - 41 U/L Final  . ALT 12/10/2018 16  0 - 44 U/L Final  . Alkaline Phosphatase 12/10/2018 170* 38 - 126 U/L Final  . Total Bilirubin 12/10/2018 0.5  0.3 - 1.2 mg/dL Final  . GFR calc non Af Amer 12/10/2018 49* >60 mL/min Final  . GFR calc Af Amer 12/10/2018 56* >60 mL/min Final  . Anion gap 12/10/2018 6  5 - 15 Final   Performed at [facility], [address] [place], [place] [address]  . LDH 12/10/2018 196* 98 - 192 U/L Final   Performed at [facility], [address] [place], [place] [address]  . Ferritin  12/10/2018 51  11 - 307 ng/mL Final   Performed at [facility], [address] [place], [place] [address]  . Hgb A2 Quant 12/10/2018 2.1  1.8 - 3.2 % Final  . Hgb F Quant 12/10/2018 0.0  0.0 - 2.0 % Final  . Hgb S Quant 12/10/2018 0.0  0.0 % Final  . Hgb C 12/10/2018 0.0  0.0 % Corrected  . Hgb A 12/10/2018 97.9  96.4 - 98.8 % Final  . Hgb Variant 12/10/2018 0.0  0.0 % Corrected  . Please Note: 12/10/2018 Comment   Corrected   Comment: (NOTE) Normal adult hemoglobin present. Performed At: [facility] [address] [place], [place] [id number] Nagendra Sanjai MD Ph:[phone]   . Vitamin B-12 12/10/2018 703  180 - 914 pg/mL Final   Comment: (NOTE) This assay is not validated for testing neonatal or myeloproliferative syndrome specimens for Vitamin B12 levels. Performed at [facility], [address] [place], [place] [address]   . Folate 12/10/2018 19.4  >5.9 ng/mL Final   Performed at [facility], [address] [place], [place] [address]  . Haptoglobin 12/10/2018 138  34 - 200 mg/dL Final   Comment: (NOTE)   **Effective December 15, 2018 Haptoglobin     reference interval will be changing to:            Age              Female             Female          0 -  6 months     Not Estab.       Not Estab.   7 months -  1 year       23 - 218         23 - 218          2 -  5 years      10 - 212         10 - 212          6 - 12 years      10 - 182         10 - 182         13 - 17 years      20 - 191         22 - 208           18 - 40 years      17 - 317         33 - 278         41 - 50 years      42 - [place] years      60 - 665         99 - 357         01 - 70 years      1 - 779         39 - 030         09 - 80 years      60 - 233         00 - 762             >80 years      22 - [place] Performed At: [facility] [address] [place], [place] [id number] Rush Farmer MD YB:[id number]      Pathology Orders Placed This  Encounter  Procedures  . CT ABDOMEN PELVIS W CONTRAST    Standing Status:   Future    Standing Expiration Date:   12/16/2019    Order Specific Question:   If indicated for the ordered procedure, I authorize the administration of contrast media per Radiology protocol    Answer:   Yes    Order Specific Question:   Preferred imaging location?    Answer:   [facility]    Order Specific Question:   Is Oral Contrast requested for this exam?    Answer:   Yes, Per Radiology protocol    Order Specific Question:   Radiology Contrast Protocol - do NOT remove file path    Answer:   [email or web address]  . CBC with Differential/Platelet    Standing Status:   Future    Standing Expiration Date:   12/16/2020  . Comprehensive metabolic panel    Standing Status:   Future    Standing Expiration Date:   12/16/2020  . Lactate dehydrogenase    Standing Status:   Future    Standing Expiration Date:   12/16/2020  . Ferritin    Standing Status:   Future    Standing Expiration Date:   12/16/2020       Zoila Shutter MD

## 2018-12-17 ENCOUNTER — Encounter: Payer: Medicare Other | Admitting: Family Medicine

## 2018-12-17 ENCOUNTER — Encounter: Payer: Self-pay | Admitting: Family Medicine

## 2018-12-17 MED ORDER — HYDROCODONE-ACETAMINOPHEN 10-325 MG PO TABS: ORAL_TABLET | ORAL | 0 refills | Status: DC

## 2018-12-17 MED ORDER — HYDROCODONE-ACETAMINOPHEN 10-325 MG PO TABS: ORAL_TABLET | ORAL | 0 refills | Status: AC

## 2018-12-17 NOTE — Assessment & Plan Note (Signed)
The patient's Controlled Substance registry is reviewed and compliance confirmed. Adequacy of  Pain control and level of function is assessed. Medication dosing is adjusted as deemed appropriate. Twelve weeks of medication is prescribed , patient signs for the script and is provided with a follow up appointment between 11 to 12 weeks .  

## 2018-12-17 NOTE — Progress Notes (Signed)
[name]     MRN: [id number]      DOB: 16-Mar-1941  HPI: Patient is in for annual physical exam. Chronic pain management is addressed, also diabetes management as she is experiencing blood sugar lows with a low hBA1C Recent labs, are reviewed.  PE: BP (!) 144/52 (BP Location: Left Arm, Patient Position: Sitting, Cuff Size: Large)   Pulse 69   Resp 12   Ht 5\' 6"  (1.676 m)   Wt 264 lb (119.7 kg)   SpO2 96% Comment: room air  BMI 42.61 kg/m   Pleasant  female, alert and oriented x 3, in no cardio-pulmonary distress. Afebrile. HEENT No facial trauma or asymetry. Sinuses non tender.  Extra occullar muscles intact, pupils equally reactive to light. External ears normal, tympanic membranes clear. Oropharynx moist, no exudate. Neck: supple, no adenopathy,JVD or thyromegaly.No bruits.  Chest: Clear to ascultation bilaterally.No crackles or wheezes. Non tender to palpation  Breast: No asymetry,no masses or lumps. No tenderness. No nipple discharge or inversion. No axillary or supraclavicular adenopathy  Cardiovascular system; Heart sounds normal,  S1 and  S2 ,no S3.  No murmur, or thrill. Apical beat not displaced Peripheral pulses normal.  Abdomen: Soft, non tender, no organomegaly or masses. No bruits. Bowel sounds normal. No guarding, tenderness or rebound.  Musculoskeletal exam: Decreased  ROM of spine, hips , shoulders and knees. Deformity ,swelling and  crepitus noted. No muscle wasting or atrophy.   Neurologic: Cranial nerves 2 to 12 intact. Power, tone ,sensation and reflexes normal throughout.  disturbance in gait. No tremor.  Skin: Intact, no ulceration, erythema , scaling or rash noted. Pigmentation normal throughout  Psych; Normal mood and affect. Judgement and concentration normal   Assessment & Plan:  Annual physical exam Annual exam as documented. Counseling done  re healthy lifestyle involving commitment to 150 minutes exercise per  week, heart healthy diet, and attaining healthy weight.The importance of adequate sleep also discussed. Immunization and cancer screening needs are specifically addressed at this visit.   Diabetes mellitus, insulin dependent (IDDM), controlled ([place]) Overcorrected with hypoglycemia Reduce dose of medication, titrate lantus down to 25 units daily [name] is reminded of the importance of commitment to daily physical activity for 30 minutes or more, as able and the need to limit carbohydrate intake to 30 to 60 grams per meal to help with blood sugar control.   The need to take medication as prescribed, test blood sugar as directed, and to call between visits if there is a concern that blood sugar is uncontrolled is also discussed.   [name] is reminded of the importance of daily foot exam, annual eye examination, and good blood sugar, blood pressure and cholesterol control.  Diabetic Labs Latest Ref Rng & Units 12/10/2018 11/03/2018 10/16/2018 09/15/2018 07/22/2018  HbA1c <5.7 % of total Hgb - - 6.1(H) - -  Microalbumin mg/dL - - - - -  Micro/Creat Ratio <30 mcg/mg creat - - - - -  Chol <200 mg/dL - - 215(H) - -  HDL >50 mg/dL - - 51 - -  Calc LDL mg/dL (calc) - - 145(H) - -  Triglycerides <150 mg/dL - - 89 - -  Creatinine 0.44 - 1.00 mg/dL 1.11(H) 0.94 1.22(H) 1.13(H) 1.10(H)   BP/Weight 12/16/2018 12/16/2018 12/08/2018 12/01/2018 11/06/2018 11/03/2018 19/50/9326  Systolic BP 712 458 099 833 825 053 976  Diastolic BP 52 46 67 65 51 75 52  Wt. 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       Morbid obesity Unchanged. Patient re-educated about  the importance of commitment to a  minimum of 150 minutes of exercise per week.  The importance of healthy food  choices with portion control discussed. Encouraged to start a food diary, count calories and to consider  joining a support group. Sample diet sheets offered. Goals set by the patient for the next several months.   Weight /BMI 12/16/2018 12/16/2018 12/01/2018  WEIGHT 264 lb 267 lb 257 lb  HEIGHT 5\' 6"  - 5\' 6"   BMI 42.61 kg/m2 43.09 kg/m2 41.48 kg/m2      Encounter for chronic pain management The patient's Controlled Substance registry is reviewed and compliance confirmed. Adequacy of  Pain control and level of function is assessed. Medication dosing is adjusted as deemed appropriate. Twelve weeks of medication is prescribed , patient signs for the script and is provided with a follow up appointment between 11 to 12 weeks .

## 2018-12-17 NOTE — Assessment & Plan Note (Signed)
Annual exam as documented. Counseling done  re healthy lifestyle involving commitment to 150 minutes exercise per week, heart healthy diet, and attaining healthy weight.The importance of adequate sleep also discussed.  Immunization and cancer screening needs are specifically addressed at this visit.  

## 2018-12-17 NOTE — Assessment & Plan Note (Signed)
Overcorrected with hypoglycemia Reduce dose of medication, titrate lantus down to 25 units daily [name] is reminded of the importance of commitment to daily physical activity for 30 minutes or more, as able and the need to limit carbohydrate intake to 30 to 60 grams per meal to help with blood sugar control.   The need to take medication as prescribed, test blood sugar as directed, and to call between visits if there is a concern that blood sugar is uncontrolled is also discussed.   [name] is reminded of the importance of daily foot exam, annual eye examination, and good blood sugar, blood pressure and cholesterol control.  Diabetic Labs Latest Ref Rng & Units 12/10/2018 11/03/2018 10/16/2018 09/15/2018 07/22/2018  HbA1c <5.7 % of total Hgb - - 6.1(H) - -  Microalbumin mg/dL - - - - -  Micro/Creat Ratio <30 mcg/mg creat - - - - -  Chol <200 mg/dL - - 215(H) - -  HDL >50 mg/dL - - 51 - -  Calc LDL mg/dL (calc) - - 145(H) - -  Triglycerides <150 mg/dL - - 89 - -  Creatinine 0.44 - 1.00 mg/dL 1.11(H) 0.94 1.22(H) 1.13(H) 1.10(H)   BP/Weight 12/16/2018 12/16/2018 12/08/2018 12/01/2018 11/06/2018 11/03/2018 63/87/5643  Systolic BP 329 518 841 660 630 160 109  Diastolic BP 52 46 67 65 51 75 52  Wt. 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

## 2018-12-17 NOTE — Assessment & Plan Note (Signed)
Unchanged. Patient re-educated about  the importance of commitment to a  minimum of 150 minutes of exercise per week.  The importance of healthy food choices with portion control discussed. Encouraged to start a food diary, count calories and to consider  joining a support group. Sample diet sheets offered. Goals set by the patient for the next several months.   Weight /BMI 12/16/2018 12/16/2018 12/01/2018  WEIGHT 264 lb 267 lb 257 lb  HEIGHT 5\' 6"  - 5\' 6"   BMI 42.61 kg/m2 43.09 kg/m2 41.48 kg/m2

## 2018-12-30 ENCOUNTER — Other Ambulatory Visit: Payer: Self-pay | Admitting: Family Medicine

## 2019-01-01 ENCOUNTER — Encounter (HOSPITAL_COMMUNITY): Payer: Self-pay | Admitting: Internal Medicine

## 2019-01-05 ENCOUNTER — Other Ambulatory Visit (HOSPITAL_COMMUNITY): Payer: Self-pay | Admitting: Internal Medicine

## 2019-01-05 ENCOUNTER — Ambulatory Visit (HOSPITAL_COMMUNITY)
Admission: RE | Admit: 2019-01-05 | Discharge: 2019-01-05 | Disposition: A | Discharge status: Home / Self Care | Payer: Medicare Other | Source: Ambulatory Visit | Attending: Internal Medicine | Admitting: Internal Medicine

## 2019-01-05 ENCOUNTER — Encounter (HOSPITAL_COMMUNITY): Payer: Self-pay

## 2019-01-05 ENCOUNTER — Inpatient Hospital Stay (HOSPITAL_COMMUNITY): Payer: Medicare Other | Attending: Hematology

## 2019-01-05 DIAGNOSIS — E039 Hypothyroidism, unspecified: Secondary | ICD-10-CM | POA: Insufficient documentation

## 2019-01-05 DIAGNOSIS — E1122 Type 2 diabetes mellitus with diabetic chronic kidney disease: Secondary | ICD-10-CM | POA: Insufficient documentation

## 2019-01-05 DIAGNOSIS — D508 Other iron deficiency anemias: Secondary | ICD-10-CM

## 2019-01-05 DIAGNOSIS — R1084 Generalized abdominal pain: Secondary | ICD-10-CM

## 2019-01-05 DIAGNOSIS — D472 Monoclonal gammopathy: Secondary | ICD-10-CM

## 2019-01-05 DIAGNOSIS — R634 Abnormal weight loss: Secondary | ICD-10-CM

## 2019-01-05 DIAGNOSIS — Z79899 Other long term (current) drug therapy: Secondary | ICD-10-CM | POA: Insufficient documentation

## 2019-01-05 DIAGNOSIS — N183 Chronic kidney disease, stage 3 (moderate): Secondary | ICD-10-CM | POA: Insufficient documentation

## 2019-01-05 DIAGNOSIS — D509 Iron deficiency anemia, unspecified: Secondary | ICD-10-CM | POA: Insufficient documentation

## 2019-01-05 DIAGNOSIS — I129 Hypertensive chronic kidney disease with stage 1 through stage 4 chronic kidney disease, or unspecified chronic kidney disease: Secondary | ICD-10-CM | POA: Insufficient documentation

## 2019-01-05 DIAGNOSIS — D631 Anemia in chronic kidney disease: Secondary | ICD-10-CM | POA: Insufficient documentation

## 2019-01-05 MED ORDER — IOPAMIDOL (ISOVUE-300) INJECTION 61%: 100.0000 mL | Freq: Once | INTRAVENOUS | Status: DC | PRN

## 2019-01-07 ENCOUNTER — Inpatient Hospital Stay (HOSPITAL_BASED_OUTPATIENT_CLINIC_OR_DEPARTMENT_OTHER): Payer: Medicare Other | Admitting: Internal Medicine

## 2019-01-07 ENCOUNTER — Other Ambulatory Visit: Payer: Self-pay

## 2019-01-07 ENCOUNTER — Encounter (HOSPITAL_COMMUNITY): Payer: Self-pay | Admitting: Internal Medicine

## 2019-01-07 ENCOUNTER — Other Ambulatory Visit (HOSPITAL_COMMUNITY): Payer: Self-pay | Admitting: Internal Medicine

## 2019-01-07 VITALS — BP 174/54 | HR 66 | Temp 98.4°F | Ht 66.0 in | Wt 261.6 lb

## 2019-01-07 DIAGNOSIS — Z87891 Personal history of nicotine dependence: Secondary | ICD-10-CM | POA: Diagnosis not present

## 2019-01-07 DIAGNOSIS — D472 Monoclonal gammopathy: Secondary | ICD-10-CM | POA: Diagnosis not present

## 2019-01-07 DIAGNOSIS — E039 Hypothyroidism, unspecified: Secondary | ICD-10-CM | POA: Diagnosis not present

## 2019-01-07 DIAGNOSIS — I129 Hypertensive chronic kidney disease with stage 1 through stage 4 chronic kidney disease, or unspecified chronic kidney disease: Secondary | ICD-10-CM | POA: Diagnosis not present

## 2019-01-07 DIAGNOSIS — E1122 Type 2 diabetes mellitus with diabetic chronic kidney disease: Secondary | ICD-10-CM | POA: Diagnosis not present

## 2019-01-07 DIAGNOSIS — E119 Type 2 diabetes mellitus without complications: Secondary | ICD-10-CM

## 2019-01-07 DIAGNOSIS — D509 Iron deficiency anemia, unspecified: Secondary | ICD-10-CM

## 2019-01-07 DIAGNOSIS — Z79899 Other long term (current) drug therapy: Secondary | ICD-10-CM | POA: Diagnosis not present

## 2019-01-07 DIAGNOSIS — I10 Essential (primary) hypertension: Secondary | ICD-10-CM

## 2019-01-07 DIAGNOSIS — D508 Other iron deficiency anemias: Secondary | ICD-10-CM

## 2019-01-07 DIAGNOSIS — N183 Chronic kidney disease, stage 3 (moderate): Secondary | ICD-10-CM | POA: Diagnosis not present

## 2019-01-07 DIAGNOSIS — D631 Anemia in chronic kidney disease: Secondary | ICD-10-CM | POA: Diagnosis not present

## 2019-01-07 LAB — CBC WITH DIFFERENTIAL/PLATELET
Abs Immature Granulocytes: 0.04 10*3/uL (ref 0.00–0.07)
Basophils Absolute: 0 10*3/uL (ref 0.0–0.1)
Basophils Relative: 0 %
Eosinophils Absolute: 0.3 10*3/uL (ref 0.0–0.5)
Eosinophils Relative: 3 %
HEMATOCRIT: 28.7 % — AB (ref 36.0–46.0)
Hemoglobin: 8.5 g/dL — ABNORMAL LOW (ref 12.0–15.0)
Immature Granulocytes: 0 %
Lymphocytes Relative: 17 %
Lymphs Abs: 1.8 10*3/uL (ref 0.7–4.0)
MCH: 28.1 pg (ref 26.0–34.0)
MCHC: 29.6 g/dL — ABNORMAL LOW (ref 30.0–36.0)
MCV: 94.7 fL (ref 80.0–100.0)
MONO ABS: 1.1 10*3/uL — AB (ref 0.1–1.0)
Monocytes Relative: 11 %
Neutro Abs: 6.8 10*3/uL (ref 1.7–7.7)
Neutrophils Relative %: 69 %
Platelets: 331 10*3/uL (ref 150–400)
RBC: 3.03 MIL/uL — ABNORMAL LOW (ref 3.87–5.11)
RDW: 17.1 % — ABNORMAL HIGH (ref 11.5–15.5)
WBC: 10.1 10*3/uL (ref 4.0–10.5)
nRBC: 0 % (ref 0.0–0.2)

## 2019-01-07 LAB — COMPREHENSIVE METABOLIC PANEL
ALK PHOS: 139 U/L — AB (ref 38–126)
ALT: 16 U/L (ref 0–44)
AST: 16 U/L (ref 15–41)
Albumin: 3.6 g/dL (ref 3.5–5.0)
Anion gap: 7 (ref 5–15)
BUN: 14 mg/dL (ref 8–23)
CO2: 27 mmol/L (ref 22–32)
Calcium: 8.7 mg/dL — ABNORMAL LOW (ref 8.9–10.3)
Chloride: 105 mmol/L (ref 98–111)
Creatinine, Ser: 0.87 mg/dL (ref 0.44–1.00)
GFR calc Af Amer: >60 mL/min (ref >60)
GFR calc non Af Amer: >60 mL/min (ref >60)
Glucose, Bld: 169 mg/dL — ABNORMAL HIGH (ref 70–99)
Potassium: 3.6 mmol/L (ref 3.5–5.1)
Sodium: 139 mmol/L (ref 135–145)
Total Bilirubin: 0.8 mg/dL (ref 0.3–1.2)
Total Protein: 7 g/dL (ref 6.5–8.1)

## 2019-01-07 LAB — LACTATE DEHYDROGENASE: LDH: 224 U/L — ABNORMAL HIGH (ref 98–192)

## 2019-01-07 LAB — TYPE AND SCREEN
ABO/RH(D): B POS
Antibody Screen: NEGATIVE

## 2019-01-07 LAB — FERRITIN: Ferritin: 24 ng/mL (ref 11–307)

## 2019-01-07 NOTE — Progress Notes (Signed)
Diagnosis Other iron deficiency anemia - Plan: CBC with Differential/Platelet, Comprehensive metabolic panel, Lactate dehydrogenase, Ferritin, Type and screen, Type and screen, Ferritin, Lactate dehydrogenase, Comprehensive metabolic panel, CBC with Differential/Platelet, CBC with Differential/Platelet, Comprehensive metabolic panel, Lactate dehydrogenase, Ferritin, Protein electrophoresis, serum  Staging Cancer Staging No matching staging information was found for the patient.  Assessment and Plan:   1. Iron deficiency anemia.  Pt was last treated with IV iron 07/2018.   Labs done today 01/07/2019 reviewed and showed WBC 10.1 HB 8.5 plts 331,000.  Chemistries WNL with K+ 3.6 Cr 0.87 and normal LFTs.  LDH 224.  Awaiting Ferritin results. Pt referred to GI who will see her tomorrow for evaluation.  Pt will RTC in 03/2019 for follow-up.  If iron levels decreased she will be recommended for IV iron.  Type and screen also done today.    2.  GI symptoms/Rectal bleeding.  .  Pt is reporting trouble swallowing, constipation and nausea. EGD in 2015 showed Schatzki's ring - status post dilation and Hiatal hernia.  CT was ordered, but radiology had difficulty placing IV.  Abdominal USN done 01/05/2019 was negative.  She has been referred to GI for evaluation and they will see pt tomorrow.   HB 8.5.  Awaiting ferritin results.    3. IgA kappa monoclonal gammopathy. Immunofixation demonstrated an IgA kappa monoclonal gammopathy. She was given option of bone marrow biopsy by Dr. Talbert Cage in the past but did not desire to have procedure done.  Pt had SPEP done 09/15/2018 that was reviewed and was negative. FLC ratio is 1.54.  She has normal quantitative immunoglobulins.  Will repeat SPEP on RTC.  Cr 0.87 on labs done 01/07/2019 with normal calcium of 8.7.    4.  Foot pain.  Pt should follow-up with PCP if ongoing symptoms.  Recent SPEP done 08/2018 was negative.    5.  HTN.  BP is 174/54.  Follow-up with PCP.    6.  DM.   Follow-up with PCP for monitoring.    7  Health maintenance.  Pt will be seen by GI tomorrow for evaluation due to rectal bleeding.    25 minutes spent with more than 50% spent in counseling and coordination of care.    Current Status:  Pt is seen today for follow-up.  She is here to go over labs and USN.  She reports some rectal bleeding.  She reports some right foot pain.    Problem List Patient Active Problem List   Diagnosis Date Noted  . Trigger finger, right index finger [M65.321]   . 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 Memory loss of unknown cause [R41.3] 11/10/2017  . Chronic left shoulder pain [M25.512, G89.29] 09/14/2017  . Encounter for chronic pain management [G89.29] 02/09/2017  . 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 Morbid obesity ([place]) [E66.01] 04/18/2015  . Myasthenia gravis in remission ([place]) [G70.00] 11/24/2014  . Myasthenia gravis ([place]) [G70.00] 11/16/2014  . Annual physical exam [K44.01] 08/26/2014  . Vitamin D deficiency [E55.9] 05/24/2014  . Osteopenia [M85.80] 04/26/2014  . 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 Esophageal dysphagia [R13.10] 04/03/2012  . Abnormal chest CT [R93.89] 03/02/2012  . Insomnia [G47.00] 01/02/2012  . Essential hypertension, benign [I10] 05/30/2011  . Schatzki's ring [K22.2] 05/07/2011  . INGROWN TOENAIL [L60.0] 01/10/2011  . GLAUCOMA [H40.9] 08/08/2010  . DYSCHROMIA, UNSPECIFIED [L81.9] 08/08/2010  . Chronic renal disease, stage 3,  moderately decreased glomerular filtration rate (GFR) between 30-59 mL/min/1.73 square meter ([place]) [N18.3] 09/14/2009  . HIP PAIN, LEFT [M25.559] 06/09/2009  . Urinary incontinence [R32] 06/09/2009  . Iron deficiency anemia [D50.9] 10/02/2008  . Hypothyroidism [E03.9] 09/27/2008  . Hyperlipemia [E78.5] 06/30/2008  . Depression with anxiety [F41.8] 06/30/2008  . GERD [K21.9]  06/30/2008  . Obstructive sleep apnea [G47.33] 06/30/2008  . CARPAL TUNNEL SYNDROME [G56.00] 05/19/2008  . SHOULDER PAIN [M25.519] 02/02/2008  . IMPINGEMENT SYNDROME [M75.80] 02/02/2008  . 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 Diabetes mellitus, insulin dependent (IDDM), controlled ([place]) [E11.9, Z79.4] 09/09/2007    Past Medical History Past Medical History:  Diagnosis Date  . Anemia in chronic renal disease 12/30/2015  . Anxiety   . Arthritis    "all over" (01/19/2014)  . Arthritis, lumbar spine   . Carpal tunnel syndrome   . Chronic bronchitis ([place])    "got it q yr for awhile; hasn't had it in awhile" (01/19/2014)  . Chronic kidney disease (CKD) stage G3b/A1, moderately decreased glomerular filtration rate (GFR) between 30-44 mL/min/1.73 square meter and albuminuria creatinine ratio less than 30 mg/g (HCC) 09/14/2009   Qualifier: Diagnosis of  By: Moshe Cipro MD, Joycelyn Schmid    . Chronic renal disease, stage 3, moderately decreased glomerular filtration rate (GFR) between 30-59 mL/min/1.73 square meter (HCC) 09/14/2009   Qualifier: Diagnosis of  By: Moshe Cipro MD, Joycelyn Schmid    . Complication of anesthesia    combative  . Depression   . Diverticulosis 07/2004   Colonscopy Dr Gala Romney  . Esophagitis, erosive 2009  . Exertional shortness of breath   . Gastroesophageal reflux   . QJJHERDE(081.4)    "usually a couple times/wk" (01/19/2014)  . Heart murmur    saw cardiology In [place], he told her she did not need to come back.  . Hyperlipidemia   . Hypertension   . Impingement syndrome, shoulder   . Iron deficiency anemia   . Mitral regurgitation   . Myasthenia gravis   . Myasthenia gravis in remission ([place]) 11/24/2014  . Obesity   . OSA on CPAP   . Pneumonia 01/2012  . Pulmonary HTN ([place])   . Schatzki's ring    Last EGD w/ dilation 02/08/11, 2009 & 2007  . Seasonal allergies   . Shoulder pain   . Thyroid disease    "used to take RX; they took me off it" (01/19/2014)  .  Tobacco abuse   . Type II diabetes mellitus ([place])     Past Surgical History Past Surgical History:  Procedure Laterality Date  . [place] VITRECTOMY WITH 20 GAUGE MVR PORT Left 01/19/2014   Procedure: 25 GAUGE PARS PLANA VITRECTOMY WITH 20 GAUGE MVR PORT; MEMBRAME PEEL; SERUM PATCH; LASER TREATMENT; C3F8;  Surgeon: Hayden Pedro, MD;  Location: [facility];  Service: Ophthalmology;  Laterality: Left;  . ABDOMINAL HYSTERECTOMY    . APPENDECTOMY    . CARPAL TUNNEL RELEASE Bilateral   . CATARACT EXTRACTION W/PHACO Left 10/21/2013   Procedure: LEFT CATARACT EXTRACTION PHACO AND INTRAOCULAR LENS PLACEMENT (IOC);  Surgeon: Marylynn Pearson, MD;  Location: [facility];  Service: Ophthalmology;  Laterality: Left;  . CHOLECYSTECTOMY    . COLONOSCOPY  05/08/2012   GYJ:EHUDJSHF and external hemorrhoids; colonic diverticulosis  . ESOPHAGEAL DILATION     "more than 3 times" (01/19/2014)  . ESOPHAGOGASTRODUODENOSCOPY  02/08/11   [place]-Distal esophageal erosion consistent with mild erosive reflux   esophagitis/ Noncritical Schatzki ring, small hiatal hernia otherwise upper/ gastrointestinal tract  appeared unremarkable, status post passage  of a Maloney dilation to biopsy disruption of the ring described  . ESOPHAGOGASTRODUODENOSCOPY  04/2012   2 tandem incomplete distal esophagea rings s/p dilation.   . ESOPHAGOGASTRODUODENOSCOPY N/A 09/16/2014   Dr. Gala Romney: Schatzki ring status post dilation/disruption.  Hiatal hernia.  Marland Kitchen ESOPHAGOGASTRODUODENOSCOPY (EGD) WITH PROPOFOL N/A 12/08/2018   Procedure: ESOPHAGOGASTRODUODENOSCOPY (EGD) WITH PROPOFOL;  Surgeon: Daneil Dolin, MD;  [facility];  Service: Endoscopy;  Laterality: N/A;  1:15pm  . EYE SURGERY    . INCONTINENCE SURGERY  08/26/09   [place]  . JOINT REPLACEMENT     right total knee  . MALONEY DILATION N/A 09/16/2014   Procedure: Venia Minks DILATION;  Surgeon: Daneil Dolin, MD;  [facility];  Service: Endoscopy;  Laterality: N/A;  .  Venia Minks DILATION N/A 12/08/2018   Procedure: Venia Minks DILATION;  Surgeon: Daneil Dolin, MD;  [facility];  Service: Endoscopy;  Laterality: N/A;  . PARS PLANA VITRECTOMY W/ REPAIR OF MACULAR HOLE Left 01/19/2014  . SAVORY DILATION N/A 09/16/2014   Procedure: SAVORY DILATION;  Surgeon: Daneil Dolin, MD;  [facility];  Service: Endoscopy;  Laterality: N/A;  . TONSILLECTOMY    . TOTAL KNEE ARTHROPLASTY Right 05/13/07   Dr. Aline Brochure  . TRIGGER FINGER RELEASE Right 06/26/2018   Procedure: RIGHT INDEX FINGER TRIGGER FINGER/A-1 PULLEY RELEASE;  Surgeon: Carole Civil, MD;  Location[facility];  Service: Orthopedics;  Laterality: Right;    Family History Family History  Problem Relation Age of Onset  . Cancer Mother   . Heart disease Mother   . Cancer Father   . Heart disease Father   . Diabetes Sister   . Hypertension Brother   . Heart disease Brother   . Cancer Sister   . Kidney failure Sister   . Diabetes Son   . Hypertension Son   . Hypertension Daughter   . Colon cancer Neg Hx      Social History  reports that she quit smoking about 15 years ago. Her smoking use included cigarettes. She has a 12.50 pack-year smoking history. She has never used smokeless tobacco. She reports that she does not drink alcohol or use drugs.  Medications  Current Outpatient Medications:  .  acetaminophen (TYLENOL) 500 MG tablet, Take 500 mg by mouth at bedtime., Disp: , Rfl:  .  B-D ULTRAFINE III SHORT PEN 31G X 8 MM MISC, USE ONCE DAILY WITH LANTUS SOLOSTAR PEN., Disp: 100 each, Rfl: 0 .  brimonidine-timolol (COMBIGAN) 0.2-0.5 % ophthalmic solution, Place 1 drop into both eyes every 12 (twelve) hours., Disp: , Rfl:  .  cetirizine (ZYRTEC) 10 MG tablet, TAKE 1 TABLET BY MOUTH ONCE A DAY. (Patient taking differently: Take 10 mg by mouth daily. ), Disp: 30 tablet, Rfl: 5 .  Cholecalciferol (VITAMIN D3) 2000 units TABS, Take 2,000 Units by mouth at bedtime., Disp: , Rfl:  .   citalopram (CELEXA) 20 MG tablet, TAKE 1 TABLET BY MOUTH ONCE A DAY., Disp: 30 tablet, Rfl: 0 .  Cranberry 1000 MG CAPS, Take 1,000 mg by mouth daily., Disp: , Rfl:  .  cycloSPORINE (RESTASIS) 0.05 % ophthalmic emulsion, Place 1 drop into both eyes 2 (two) times daily. , Disp: , Rfl:  .  diltiazem (CARDIZEM CD) 240 MG 24 hr capsule, TAKE (1) CAPSULE BY MOUTH TWICE DAILY., Disp: 60 capsule, Rfl: 0 .  docusate sodium (COLACE) 100 MG capsule, Take 100 mg by mouth  at bedtime., Disp: , Rfl:  .  ezetimibe (ZETIA) 10 MG tablet, Take 1 tablet (10 mg total) by mouth daily., Disp: 30 tablet, Rfl: 5 .  Flaxseed, Linseed, (FLAXSEED OIL) 1000 MG CAPS, Take 1,000 mg by mouth daily., Disp: , Rfl:  .  fluticasone (FLONASE) 50 MCG/ACT nasal spray, PLACE 2 SPRAYS INTO BOTH NOSTRILS DAILY., Disp: 16 g, Rfl: 0 .  gabapentin (NEURONTIN) 300 MG capsule, TAKE (1) CAPSULE BY MOUTH AT BEDTIME. (Patient taking differently: Take 300 mg by mouth 2 (two) times daily. ), Disp: 30 capsule, Rfl: 0 .  hydrALAZINE (APRESOLINE) 25 MG tablet, TAKE 1 TABLET BY MOUTH THREE TIMES A DAY., Disp: 90 tablet, Rfl: 0 .  HYDROcodone-acetaminophen (NORCO) 10-325 MG tablet, Take one tablet by mouth two times daily for pain, Disp: 60 tablet, Rfl: 0 .  [START ON 01/28/2019] HYDROcodone-acetaminophen (NORCO) 10-325 MG tablet, Take one tablet two times daily for pain, Disp: 60 tablet, Rfl: 0 .  [START ON 02/27/2019] HYDROcodone-acetaminophen (NORCO) 10-325 MG tablet, Take one tablet two times daily for pain, Disp: 60 tablet, Rfl: 0 .  LANTUS SOLOSTAR 100 UNIT/ML Solostar Pen, INJECT 30 UNITS SUBCUTANEOUSLY ONCE DAILY. (Patient taking differently: Inject 30 Units into the skin daily. ), Disp: 15 mL, Rfl: 5 .  losartan (COZAAR) 100 MG tablet, TAKE (1) TABLET BY MOUTH DAILY., Disp: 30 tablet, Rfl: 0 .  Multiple Vitamin (MULTIVITAMIN WITH MINERALS) TABS tablet, Take 1 tablet by mouth at bedtime., Disp: , Rfl:  .  ondansetron (ZOFRAN) 4 MG tablet, TAKE (1)  TABLET BY MOUTH EVERY FOUR HOURS AS NEEDED FOR NAUSEA. (Patient taking differently: Take 4 mg by mouth every 4 (four) hours as needed for nausea or vomiting. TAKE (1) TABLET BY MOUTH EVERY FOUR HOURS AS NEEDED FOR NAUSEA.), Disp: 30 tablet, Rfl: 0 .  ONETOUCH DELICA LANCETS 01S MISC, USE AS DIRECTED 3 TIMES DAILY FOR BLOOD GLUCOSE TESTING., Disp: 100 each, Rfl: 5 .  ONETOUCH VERIO test strip, USE AS DIRECTED TO TEST BLOOD GLUCOSE 3 TIMES DAILY., Disp: 100 each, Rfl: 5 .  pantoprazole (PROTONIX) 40 MG tablet, Take 1 tablet (40 mg total) by mouth 2 (two) times daily before a meal., Disp: 60 tablet, Rfl: 5 .  polyethylene glycol powder (GLYCOLAX/MIRALAX) powder, MIX 17G (1 CAPFUL) IN 8 OUNCES OF JUICE OR WATER AND DRINK ONCE DAILY., Disp: 527 g, Rfl: 0 .  predniSONE (DELTASONE) 5 MG tablet, TAKE 1 TABLET BY MOUTH ONCE DAILY., Disp: 30 tablet, Rfl: 0 .  PROAIR HFA 108 (90 Base) MCG/ACT inhaler, INHALE 2 PUFFS BY MOUTH EVERY 6 HOURS AS NEEDED . (Patient taking differently: Inhale 2 puffs into the lungs every 6 (six) hours as needed for wheezing or shortness of breath. ), Disp: 8.5 g, Rfl: 5 .  pyridostigmine (MESTINON) 60 MG tablet, TAKE (1) TABLET BY MOUTH THREE TIMES A DAY. (Patient taking differently: Take 60 mg by mouth 3 (three) times daily. TAKE (1) TABLET BY MOUTH THREE TIMES A DAY.), Disp: 90 tablet, Rfl: 0 .  rosuvastatin (CRESTOR) 40 MG tablet, TAKE (1) TABLET BY MOUTH AT BEDTIME. (Patient taking differently: Take 40 mg by mouth at bedtime. ), Disp: 30 tablet, Rfl: 5 .  solifenacin (VESICARE) 10 MG tablet, TAKE 1 TABLET BY MOUTH ONCE A DAY. (Patient taking differently: Take 10 mg by mouth daily. ), Disp: 30 tablet, Rfl: 0 .  spironolactone (ALDACTONE) 25 MG tablet, TAKE 1 TABLET BY MOUTH ONCE A DAY. (Patient taking differently: Take 25 mg by mouth daily. ),  Disp: 30 tablet, Rfl: 0 .  TRADJENTA 5 MG TABS tablet, TAKE 1 TABLET BY MOUTH ONCE A DAY. (Patient taking differently: Take 5 mg by mouth daily.  ), Disp: 30 tablet, Rfl: 0 .  UNABLE TO FIND, Diabetic shoes x 1 Inserts x 3 DX E11.9, Disp: 1 each, Rfl: 0 .  venlafaxine XR (EFFEXOR-XR) 150 MG 24 hr capsule, TAKE 1 CAPSULE BY MOUTH DAILY. (Patient taking differently: Take 150 mg by mouth daily with breakfast. ), Disp: 30 capsule, Rfl: 0 .  ferrous sulfate 325 (65 FE) MG tablet, Take 325 mg by mouth daily with breakfast., Disp: , Rfl:   Allergies Patient has no known allergies.  Review of Systems Review of Systems - Oncology ROS negative other than rectal bleeding and right foot pain.     Physical Exam  Vitals Wt Readings from Last 3 Encounters:  01/07/19 261 lb 9.6 oz (118.7 kg)  12/16/18 264 lb (119.7 kg)  12/16/18 267 lb (121.1 kg)   Temp Readings from Last 3 Encounters:  01/07/19 98.4 F (36.9 C) (Oral)  12/16/18 98 F (36.7 C) (Oral)  12/08/18 97.9 F (36.6 C) (Oral)   BP Readings from Last 3 Encounters:  01/07/19 (!) 174/54  12/16/18 (!) 144/52  12/16/18 (!) 159/46   Pulse Readings from Last 3 Encounters:  01/07/19 66  12/16/18 69  12/16/18 65   Constitutional: Well-developed, well-nourished, and in no distress.   HENT: Head: Normocephalic and atraumatic.  Mouth/Throat: No oropharyngeal exudate. Mucosa moist. Eyes: Pupils are equal, round, and reactive to light. Conjunctivae are normal. No scleral icterus.  Neck: Normal range of motion. Neck supple. No JVD present.  Cardiovascular: Normal rate, regular rhythm and normal heart sounds.  Exam reveals no gallop and no friction rub.   No murmur heard. Pulmonary/Chest: Effort normal and breath sounds normal. No respiratory distress. No wheezes.No rales.  Abdominal: Soft. Obese.  Bowel sounds are normal. No distension. There is no tenderness. There is no guarding.  Musculoskeletal: No edema or tenderness.  Lymphadenopathy: No cervical, axillary or supraclavicular adenopathy.  Neurological: Alert and oriented to person, place, and time. No cranial nerve deficit.   Skin: Skin is warm and dry. No rash noted. No erythema. No pallor.  Psychiatric: Affect and judgment normal.   Labs Office Visit on 01/07/2019  Component Date Value Ref Range Status  . LDH 01/07/2019 224* 98 - 192 U/L Final   Performed at [facility], [address] [place], [place] [address]  . Sodium 01/07/2019 139  135 - 145 mmol/L Final  . Potassium 01/07/2019 3.6  3.5 - 5.1 mmol/L Final  . Chloride 01/07/2019 105  98 - 111 mmol/L Final  . CO2 01/07/2019 27  22 - 32 mmol/L Final  . Glucose, Bld 01/07/2019 169* 70 - 99 mg/dL Final  . BUN 01/07/2019 14  8 - 23 mg/dL Final  . Creatinine, Ser 01/07/2019 0.87  0.44 - 1.00 mg/dL Final  . Calcium 01/07/2019 8.7* 8.9 - 10.3 mg/dL Final  . Total Protein 01/07/2019 7.0  6.5 - 8.1 g/dL Final  . Albumin 01/07/2019 3.6  3.5 - 5.0 g/dL Final  . AST 01/07/2019 16  15 - 41 U/L Final  . ALT 01/07/2019 16  0 - 44 U/L Final  . Alkaline Phosphatase 01/07/2019 139* 38 - 126 U/L Final  . Total Bilirubin 01/07/2019 0.8  0.3 - 1.2 mg/dL Final  . GFR calc non Af Amer 01/07/2019 >60  >60 mL/min Final  . GFR calc Af  Amer 01/07/2019 >60  >60 mL/min Final  . Anion gap 01/07/2019 7  5 - 15 Final   Performed at [facility], [address], [place], [place] [address]  . WBC 01/07/2019 10.1  4.0 - 10.5 K/uL Final  . RBC 01/07/2019 3.03* 3.87 - 5.11 MIL/uL Final  . Hemoglobin 01/07/2019 8.5* 12.0 - 15.0 g/dL Final  . HCT 01/07/2019 28.7* 36.0 - 46.0 % Final  . MCV 01/07/2019 94.7  80.0 - 100.0 fL Final  . MCH 01/07/2019 28.1  26.0 - 34.0 pg Final  . MCHC 01/07/2019 29.6* 30.0 - 36.0 g/dL Final  . RDW 01/07/2019 17.1* 11.5 - 15.5 % Final  . Platelets 01/07/2019 331  150 - 400 K/uL Final  . nRBC 01/07/2019 0.0  0.0 - 0.2 % Final  . Neutrophils Relative % 01/07/2019 69  % Final  . Neutro Abs 01/07/2019 6.8  1.7 - 7.7 K/uL Final  . Lymphocytes Relative 01/07/2019 17  % Final  . Lymphs Abs 01/07/2019 1.8  0.7 - 4.0 K/uL Final  . Monocytes Relative 01/07/2019  11  % Final  . Monocytes Absolute 01/07/2019 1.1* 0.1 - 1.0 K/uL Final  . Eosinophils Relative 01/07/2019 3  % Final  . Eosinophils Absolute 01/07/2019 0.3  0.0 - 0.5 K/uL Final  . Basophils Relative 01/07/2019 0  % Final  . Basophils Absolute 01/07/2019 0.0  0.0 - 0.1 K/uL Final  . Immature Granulocytes 01/07/2019 0  % Final  . Abs Immature Granulocytes 01/07/2019 0.04  0.00 - 0.07 K/uL Final   Performed at [facility], [address] [place], [place] [address]     Pathology Orders Placed This Encounter  Procedures  . CBC with Differential/Platelet    Standing Status:   Future    Number of Occurrences:   1    Standing Expiration Date:   01/08/2020  . Comprehensive metabolic panel    Standing Status:   Future    Number of Occurrences:   1    Standing Expiration Date:   01/08/2020  . Lactate dehydrogenase    Standing Status:   Future    Number of Occurrences:   1    Standing Expiration Date:   01/08/2020  . Ferritin    Standing Status:   Future    Number of Occurrences:   1    Standing Expiration Date:   01/08/2020  . CBC with Differential/Platelet    Standing Status:   Future    Standing Expiration Date:   01/08/2020  . Comprehensive metabolic panel    Standing Status:   Future    Standing Expiration Date:   01/08/2020  . Lactate dehydrogenase    Standing Status:   Future    Standing Expiration Date:   01/08/2020  . Ferritin    Standing Status:   Future    Standing Expiration Date:   01/08/2020  . Protein electrophoresis, serum    Standing Status:   Future    Standing Expiration Date:   01/08/2020  . Type and screen    Standing Status:   Future    Number of Occurrences:   1    Standing Expiration Date:   01/08/2020       Zoila Shutter MD

## 2019-01-07 NOTE — Progress Notes (Signed)
Pt presents today for f/u appt with Dr. Walden Field. Pt states, " I was in the lab Monday for lab work and they stuck me 8 times and were unable to get blood." Dr. Walden Field aware per pt. BP elevated today.R foot pain noted which pt rates a 8/10. Pt states she is passing blood when she wipes only after a BM.

## 2019-01-08 ENCOUNTER — Telehealth (HOSPITAL_COMMUNITY): Payer: Self-pay

## 2019-01-08 ENCOUNTER — Ambulatory Visit: Payer: Medicare Other | Admitting: Gastroenterology

## 2019-01-08 ENCOUNTER — Telehealth: Payer: Self-pay

## 2019-01-08 ENCOUNTER — Encounter: Payer: Self-pay | Admitting: Gastroenterology

## 2019-01-08 ENCOUNTER — Other Ambulatory Visit: Payer: Self-pay

## 2019-01-08 VITALS — BP 190/70 | HR 65 | Temp 99.0°F | Ht 66.0 in | Wt 262.0 lb

## 2019-01-08 DIAGNOSIS — D508 Other iron deficiency anemias: Secondary | ICD-10-CM | POA: Diagnosis not present

## 2019-01-08 DIAGNOSIS — K5909 Other constipation: Secondary | ICD-10-CM | POA: Insufficient documentation

## 2019-01-08 DIAGNOSIS — K625 Hemorrhage of anus and rectum: Secondary | ICD-10-CM

## 2019-01-08 DIAGNOSIS — K5900 Constipation, unspecified: Secondary | ICD-10-CM | POA: Diagnosis not present

## 2019-01-08 MED ORDER — CLENPIQ 10-3.5-12 MG-GM -GM/160ML PO SOLN: 1.0000 | Freq: Once | ORAL | 0 refills | Status: AC

## 2019-01-08 NOTE — Progress Notes (Addendum)
REVIEWED-NO ADDITIONAL RECOMMENDATIONS.  Referring Provider: Fayrene Helper, MD Primary Care Physician:  Fayrene Helper, MD  Primary GI: Dr. Gala Romney   Chief Complaint  Patient presents with  . Dysphagia    still has some trouble w/ meats/breads  . Abdominal Pain    comes/goes  . Gastroesophageal Reflux    c/o nausea/vomiting  . ida    f/.u    HPI:   [name] is a 78 y.o. female presenting today with a history of GERD, dysphagia, IDA, N/V. Recent EGD with mild Shatzki ring at [facility], s/p dilation. Small hiatal hernia, otherwise normal. Last colonoscopy in 2013 with internal hemorrhoids and diverticulosis, otherwise normal.   She had a drop in ferritin to 9 in August, Hgb 8, receiving iron infusions. Recent labs as of yesterday with Hgb again down to 8.5 from 9.6 a month ago, ferritin dropping from 51 down to 24 in one month.   US abdomen complete Jan 2020 without acute process but had large amount of overlying bowel gas that limited findings. CT had been ordered, but radiology had difficulty placing IV.   Dysphagia: some improvement. Still issues with meats and breads.   Noted bright red blood, moderate amount on Saturday. No straining. New onset. Feels constipated now. No rectal bleeding since Sunday. Takes Miralax as needed for constipation. Can't take it every day.   Abdominal pain: Noted to left of umbilicus, intermittent, better after a BM. No prescription medications for constipation. Takes Miralax about 4 times a week. Sometimes feels like bowel movements are unproductive. Some straining.   N/V with some improvement. Now two to three times a week, depending on what she eats. Anything spicy will cause vomiting. Has to spit up bread sometimes.   Past Medical History:  Diagnosis Date  . Anemia in chronic renal disease 12/30/2015  . Anxiety   . Arthritis    "all over" (01/19/2014)  . Arthritis, lumbar spine   . Carpal tunnel syndrome   . Chronic  bronchitis ([place])    "got it q yr for awhile; hasn't had it in awhile" (01/19/2014)  . Chronic kidney disease (CKD) stage G3b/A1, moderately decreased glomerular filtration rate (GFR) between 30-44 mL/min/1.73 square meter and albuminuria creatinine ratio less than 30 mg/g (HCC) 09/14/2009   Qualifier: Diagnosis of  By: Moshe Cipro MD, Joycelyn Schmid    . Chronic renal disease, stage 3, moderately decreased glomerular filtration rate (GFR) between 30-59 mL/min/1.73 square meter (HCC) 09/14/2009   Qualifier: Diagnosis of  By: Moshe Cipro MD, Joycelyn Schmid    . Complication of anesthesia    combative  . Depression   . Diverticulosis 07/2004   Colonscopy Dr Gala Romney  . Esophagitis, erosive 2009  . Exertional shortness of breath   . Gastroesophageal reflux   . PJKDTOIZ(124.5)    "usually a couple times/wk" (01/19/2014)  . Heart murmur    saw cardiology In [place], he told her she did not need to come back.  . Hyperlipidemia   . Hypertension   . Impingement syndrome, shoulder   . Iron deficiency anemia   . Mitral regurgitation   . Myasthenia gravis   . Myasthenia gravis in remission ([place]) 11/24/2014  . Obesity   . OSA on CPAP   . Pneumonia 01/2012  . Pulmonary HTN ([place])   . Schatzki's ring    Last EGD w/ dilation 02/08/11, 2009 & 2007  . Seasonal allergies   . Shoulder pain   . Thyroid disease    "used  to take RX; they took me off it" (01/19/2014)  . Tobacco abuse   . Type II diabetes mellitus ([place])     Past Surgical History:  Procedure Laterality Date  . [place] VITRECTOMY WITH 20 GAUGE MVR PORT Left 01/19/2014   Procedure: 25 GAUGE PARS PLANA VITRECTOMY WITH 20 GAUGE MVR PORT; MEMBRAME PEEL; SERUM PATCH; LASER TREATMENT; C3F8;  Surgeon: Hayden Pedro, MD;  Location: [facility];  Service: Ophthalmology;  Laterality: Left;  . ABDOMINAL HYSTERECTOMY    . APPENDECTOMY    . CARPAL TUNNEL RELEASE Bilateral   . CATARACT EXTRACTION W/PHACO Left 10/21/2013   Procedure: LEFT CATARACT EXTRACTION PHACO AND  INTRAOCULAR LENS PLACEMENT (IOC);  Surgeon: Marylynn Pearson, MD;  Location: [facility];  Service: Ophthalmology;  Laterality: Left;  . CHOLECYSTECTOMY    . COLONOSCOPY  05/08/2012   ENI:DPOEUMPN and external hemorrhoids; colonic diverticulosis  . ESOPHAGEAL DILATION     "more than 3 times" (01/19/2014)  . ESOPHAGOGASTRODUODENOSCOPY  02/08/11   [place]-Distal esophageal erosion consistent with mild erosive reflux   esophagitis/ Noncritical Schatzki ring, small hiatal hernia otherwise upper/ gastrointestinal tract appeared unremarkable, status post passage  of a Maloney dilation to biopsy disruption of the ring described  . ESOPHAGOGASTRODUODENOSCOPY  04/2012   2 tandem incomplete distal esophagea rings s/p dilation.   . ESOPHAGOGASTRODUODENOSCOPY N/A 09/16/2014   Dr. Gala Romney: Schatzki ring status post dilation/disruption.  Hiatal hernia.  Marland Kitchen ESOPHAGOGASTRODUODENOSCOPY (EGD) WITH PROPOFOL N/A 12/08/2018   EGD with mild Schatzki ring s/p dilation, small hiatal hernia, otherwise normal  . EYE SURGERY    . INCONTINENCE SURGERY  08/26/09   [place]  . JOINT REPLACEMENT     right total knee  . MALONEY DILATION N/A 09/16/2014   Procedure: Venia Minks DILATION;  Surgeon: Daneil Dolin, MD;  [facility];  Service: Endoscopy;  Laterality: N/A;  . Venia Minks DILATION N/A 12/08/2018   Procedure: Venia Minks DILATION;  Surgeon: Daneil Dolin, MD;  [facility];  Service: Endoscopy;  Laterality: N/A;  . PARS PLANA VITRECTOMY W/ REPAIR OF MACULAR HOLE Left 01/19/2014  . SAVORY DILATION N/A 09/16/2014   Procedure: SAVORY DILATION;  Surgeon: Daneil Dolin, MD;  [facility];  Service: Endoscopy;  Laterality: N/A;  . TONSILLECTOMY    . TOTAL KNEE ARTHROPLASTY Right 05/13/07   Dr. Aline Brochure  . TRIGGER FINGER RELEASE Right 06/26/2018   Procedure: RIGHT INDEX FINGER TRIGGER FINGER/A-1 PULLEY RELEASE;  Surgeon: Carole Civil, MD;  Location: [facility];  Service: Orthopedics;  Laterality: Right;     Current Outpatient Medications  Medication Sig Dispense Refill  . acetaminophen (TYLENOL) 500 MG tablet Take 500 mg by mouth at bedtime.    . B-D ULTRAFINE III SHORT PEN 31G X 8 MM MISC USE ONCE DAILY WITH LANTUS SOLOSTAR PEN. 100 each 0  . brimonidine-timolol (COMBIGAN) 0.2-0.5 % ophthalmic solution Place 1 drop into both eyes every 12 (twelve) hours.    . cetirizine (ZYRTEC) 10 MG tablet TAKE 1 TABLET BY MOUTH ONCE A DAY. (Patient taking differently: Take 10 mg by mouth daily. ) 30 tablet 5  . Cholecalciferol (VITAMIN D3) 2000 units TABS Take 2,000 Units by mouth at bedtime.    . citalopram (CELEXA) 20 MG tablet TAKE 1 TABLET BY MOUTH ONCE A DAY. 30 tablet 0  . Cranberry 1000 MG CAPS Take 1,000 mg by mouth daily.    . cycloSPORINE (RESTASIS) 0.05 % ophthalmic emulsion Place 1 drop into both eyes 2 (two)  times daily.     Marland Kitchen diltiazem (CARDIZEM CD) 240 MG 24 hr capsule TAKE (1) CAPSULE BY MOUTH TWICE DAILY. 60 capsule 0  . docusate sodium (COLACE) 100 MG capsule Take 100 mg by mouth at bedtime.    Marland Kitchen ezetimibe (ZETIA) 10 MG tablet Take 1 tablet (10 mg total) by mouth daily. 30 tablet 5  . Flaxseed, Linseed, (FLAXSEED OIL) 1000 MG CAPS Take 1,000 mg by mouth daily.    . fluticasone (FLONASE) 50 MCG/ACT nasal spray PLACE 2 SPRAYS INTO BOTH NOSTRILS DAILY. 16 g 0  . gabapentin (NEURONTIN) 300 MG capsule TAKE (1) CAPSULE BY MOUTH AT BEDTIME. (Patient taking differently: Take 300 mg by mouth 2 (two) times daily. ) 30 capsule 0  . hydrALAZINE (APRESOLINE) 25 MG tablet TAKE 1 TABLET BY MOUTH THREE TIMES A DAY. 90 tablet 0  . HYDROcodone-acetaminophen (NORCO) 10-325 MG tablet Take one tablet by mouth two times daily for pain 60 tablet 0  . LANTUS SOLOSTAR 100 UNIT/ML Solostar Pen INJECT 30 UNITS SUBCUTANEOUSLY ONCE DAILY. (Patient taking differently: Inject 30 Units into the skin daily. ) 15 mL 5  . losartan (COZAAR) 100 MG tablet TAKE (1) TABLET BY MOUTH DAILY. 30 tablet 0  . Multiple Vitamin  (MULTIVITAMIN WITH MINERALS) TABS tablet Take 1 tablet by mouth at bedtime.    . ondansetron (ZOFRAN) 4 MG tablet TAKE (1) TABLET BY MOUTH EVERY FOUR HOURS AS NEEDED FOR NAUSEA. (Patient taking differently: Take 4 mg by mouth every 4 (four) hours as needed for nausea or vomiting. TAKE (1) TABLET BY MOUTH EVERY FOUR HOURS AS NEEDED FOR NAUSEA.) 30 tablet 0  . ONETOUCH DELICA LANCETS 60F MISC USE AS DIRECTED 3 TIMES DAILY FOR BLOOD GLUCOSE TESTING. 100 each 5  . ONETOUCH VERIO test strip USE AS DIRECTED TO TEST BLOOD GLUCOSE 3 TIMES DAILY. 100 each 5  . pantoprazole (PROTONIX) 40 MG tablet Take 1 tablet (40 mg total) by mouth 2 (two) times daily before a meal. 60 tablet 5  . polyethylene glycol powder (GLYCOLAX/MIRALAX) powder MIX 17G (1 CAPFUL) IN 8 OUNCES OF JUICE OR WATER AND DRINK ONCE DAILY. 527 g 0  . predniSONE (DELTASONE) 5 MG tablet TAKE 1 TABLET BY MOUTH ONCE DAILY. 30 tablet 0  . PROAIR HFA 108 (90 Base) MCG/ACT inhaler INHALE 2 PUFFS BY MOUTH EVERY 6 HOURS AS NEEDED . (Patient taking differently: Inhale 2 puffs into the lungs every 6 (six) hours as needed for wheezing or shortness of breath. ) 8.5 g 5  . pyridostigmine (MESTINON) 60 MG tablet TAKE (1) TABLET BY MOUTH THREE TIMES A DAY. (Patient taking differently: Take 60 mg by mouth 3 (three) times daily. TAKE (1) TABLET BY MOUTH THREE TIMES A DAY.) 90 tablet 0  . rosuvastatin (CRESTOR) 40 MG tablet TAKE (1) TABLET BY MOUTH AT BEDTIME. (Patient taking differently: Take 40 mg by mouth at bedtime. ) 30 tablet 5  . solifenacin (VESICARE) 10 MG tablet TAKE 1 TABLET BY MOUTH ONCE A DAY. (Patient taking differently: Take 10 mg by mouth daily. ) 30 tablet 0  . spironolactone (ALDACTONE) 25 MG tablet TAKE 1 TABLET BY MOUTH ONCE A DAY. (Patient taking differently: Take 25 mg by mouth daily. ) 30 tablet 0  . TRADJENTA 5 MG TABS tablet TAKE 1 TABLET BY MOUTH ONCE A DAY. (Patient taking differently: Take 5 mg by mouth daily. ) 30 tablet 0  . UNABLE TO  FIND Diabetic shoes x 1 Inserts x 3 DX E11.9 1 each  0  . venlafaxine XR (EFFEXOR-XR) 150 MG 24 hr capsule TAKE 1 CAPSULE BY MOUTH DAILY. (Patient taking differently: Take 150 mg by mouth daily with breakfast. ) 30 capsule 0   No current facility-administered medications for this visit.     Allergies as of 01/08/2019  . (No Known Allergies)    Family History  Problem Relation Age of Onset  . Cancer Mother   . Heart disease Mother   . Cancer Father   . Heart disease Father   . Diabetes Sister   . Hypertension Brother   . Heart disease Brother   . Cancer Sister   . Kidney failure Sister   . Diabetes Son   . Hypertension Son   . Hypertension Daughter   . Colon cancer Neg Hx     Social History   Socioeconomic History  . Marital status: Single    Spouse name: Not on file  . Number of children: 2  . Years of education: Not on file  . Highest education level: Not on file  Occupational History  . Occupation: disabled    Employer: RETIRED  Social Needs  . Financial resource strain: Not very hard  . Food insecurity:    Worry: Never true    Inability: Never true  . Transportation needs:    Medical: Yes    Non-medical: No  Tobacco Use  . Smoking status: Former Smoker    Packs/day: 0.50    Years: 25.00    Pack years: 12.50    Types: Cigarettes    Last attempt to quit: 01/01/2004    Years since quitting: 15.0  . Smokeless tobacco: Never Used  Substance and Sexual Activity  . Alcohol use: No  . Drug use: No  . Sexual activity: Never    Birth control/protection: Surgical  Lifestyle  . Physical activity:    Days per week: 0 days    Minutes per session: 0 min  . Stress: Only a little  Relationships  . Social connections:    Talks on phone: Not on file    Gets together: Not on file    Attends religious service: Not on file    Active member of club or organization: Not on file    Attends meetings of clubs or organizations: Not on file    Relationship status: Not on  file  Other Topics Concern  . Not on file  Social History Narrative   Patient lives at home by herself   Patient drinks one cup of coffee a day   Patient is right handed.    Review of Systems: Gen: Denies fever, chills, anorexia. Denies fatigue, weakness, weight loss.  CV: Denies chest pain, palpitations, syncope, peripheral edema, and claudication. Resp: Denies dyspnea at rest, cough, wheezing, coughing up blood, and pleurisy. GI: see HPI  Derm: Denies rash, itching, dry skin Psych: Denies depression, anxiety, memory loss, confusion. No homicidal or suicidal ideation.  Heme: Denies bruising, bleeding, and enlarged lymph nodes.  Physical Exam: BP (!) 190/70 (BP Location: Left Arm, Cuff Size: Large)   Pulse 65   Temp 99 F (37.2 C) (Oral)   Ht 5\' 6"  (1.676 m)   Wt 262 lb (118.8 kg)   BMI 42.29 kg/m  General:   Alert and oriented. No distress noted. Pleasant and cooperative.  Head:  Normocephalic and atraumatic. Eyes:  Conjuctiva clear without scleral icterus. Mouth:  Oral mucosa pink and moist. Good dentition. No lesions. Abdomen:  +BS, soft, non-tender and non-distended. No rebound  or guarding. No HSM or masses noted. Msk:  Symmetrical without gross deformities. Normal posture. Extremities:  Without edema. Neurologic:  Alert and  oriented x4 Psych:  Alert and cooperative. Normal mood and affect.

## 2019-01-08 NOTE — Telephone Encounter (Signed)
Called and informed pt of pre-op appt 02/19/19 at 10:00am. Letter mailed.

## 2019-01-08 NOTE — Patient Instructions (Signed)
I have provided samples of Amitiza to take twice a day with food to avoid nausea. This is for constipation. Let me know how it works for you, and we can send in a prescription!  We have arranged a colonoscopy with Dr. Gala Romney in the near future. Take only 1/2 dose of Lantus the evening before and no Tradjenta the day of the procedure.  We will see you back in 3 months!  It was a pleasure to see you today. I strive to create trusting relationships with patients to provide genuine, compassionate, and quality care. I value your feedback. If you receive a survey regarding your visit,  I greatly appreciate you taking time to fill this out.   Annitta Needs, PhD, ANP-BC [facility]

## 2019-01-08 NOTE — Telephone Encounter (Addendum)
-----   Message from Zoila Shutter, MD sent at 01/07/2019  4:59 PM EST ----- Notify pt iron levels are low and set up for IV iron.     Per Dr. Walden Field message. Pt set up by scheduler for two iron treatments 1 week apart.

## 2019-01-12 ENCOUNTER — Telehealth: Payer: Self-pay

## 2019-01-12 ENCOUNTER — Other Ambulatory Visit: Payer: Self-pay

## 2019-01-12 ENCOUNTER — Encounter (HOSPITAL_COMMUNITY): Payer: Self-pay

## 2019-01-12 ENCOUNTER — Inpatient Hospital Stay (HOSPITAL_COMMUNITY): Payer: Medicare Other

## 2019-01-12 NOTE — Telephone Encounter (Signed)
[facility] has a question about pts prep that was sent to them on Friday 01/08/19. Please call them at [phone]

## 2019-01-12 NOTE — Progress Notes (Signed)
Pt presents today for Iron. VSS. No complaints of any changes since last visit.   Attempt to start an IV unsuccessful. Pt stuck by 3 RN's and unable to gain access for Iron infusion. Pt instructed to reschedule for next week and get appt at an earlier time and be on time for appointment due to the nature of her veins. Pt arrived late today for iron.   No complaints at this time. Discharged from clinic ambulatory. F/U with [facility] as scheduled.

## 2019-01-12 NOTE — Assessment & Plan Note (Signed)
Failing OTC agents. Start Amitiza 24 mcg po BID with food to avoid nausea. Samples provided. Call with progress report.

## 2019-01-12 NOTE — Telephone Encounter (Signed)
Called and spoke to pharmacist. Pharmacist questioned directions for prep. Advised pharmacist that pt received instructions from our office.

## 2019-01-13 ENCOUNTER — Telehealth: Payer: Self-pay | Admitting: General Practice

## 2019-01-13 NOTE — Assessment & Plan Note (Signed)
New onset, now resolved at time of visit. Likely benign source. Addressing constipation. History of internal hemorrhoids in 2013.

## 2019-01-13 NOTE — Telephone Encounter (Signed)
Patient started taking the Amitiza samples last week twice a day.  She said it's really not helping with her constipation.  She may go to the bathroom every other day and it's very hard and not much stool coming out.  The patient can be reached at [phone]

## 2019-01-13 NOTE — Assessment & Plan Note (Signed)
Pleasant 78 year old female with history of chronic anemia, IDA, with dropping ferritin and Hgb recently despite iron infusions. Recent EGD on file and reassuring. As last colonoscopy in 2013, will need to update this now. She did note moderate amount of rectal bleeding in setting of constipation, which is likely benign anorectal source. Addressing constipation as well.  Proceed with TCS with Dr. Gala Romney in near future: the risks, benefits, and alternatives have been discussed with the patient in detail. The patient states understanding and desires to proceed. Propofol due to polypharmacy Continue to follow with Hematology

## 2019-01-13 NOTE — Telephone Encounter (Signed)
Spoke with pt. She isn't taking any additional medications to help her have a bowel movement while taking the Amitiza. Pt previous took Miralax which worked some days and other days pt felt it didn't help her have a bowel movement.

## 2019-01-14 NOTE — Progress Notes (Signed)
CC'D TO PCP °

## 2019-01-14 NOTE — Telephone Encounter (Signed)
Two options:  Add Miralax once daily to BID along with Amitiza   OR  Linzess 290 mcg once daily, 30 minutes before breakfast. Would favor trying Linzess first, and if that fails, will regroup.   Can provide samples.

## 2019-01-14 NOTE — Telephone Encounter (Signed)
Pt decided to try the Linzess 290 mcg 1 30 mins before breakfast. Pt will call back to let AB know how samples worked.

## 2019-01-16 ENCOUNTER — Telehealth: Payer: Self-pay | Admitting: Internal Medicine

## 2019-01-16 NOTE — Telephone Encounter (Signed)
Pt said she can't take Linzess and was there something else. She uses [facility]. [phone]

## 2019-01-16 NOTE — Telephone Encounter (Signed)
Spoke with pt. She didn't try the Linzess. Pt read the side affects and didn't take them. Pt is aware now that she may have some loose stool with taking Linzess. If pt wants to try the Miralax to see how she's doing, without the Linzess she can or she can try the Linzess to see how she will tolerate it.

## 2019-01-19 ENCOUNTER — Encounter (HOSPITAL_COMMUNITY): Payer: Self-pay

## 2019-01-19 ENCOUNTER — Inpatient Hospital Stay (HOSPITAL_COMMUNITY): Payer: Medicare Other

## 2019-01-19 VITALS — BP 183/51 | HR 78 | Temp 98.3°F | Resp 18

## 2019-01-19 DIAGNOSIS — D631 Anemia in chronic kidney disease: Secondary | ICD-10-CM | POA: Diagnosis not present

## 2019-01-19 DIAGNOSIS — D509 Iron deficiency anemia, unspecified: Secondary | ICD-10-CM | POA: Diagnosis not present

## 2019-01-19 DIAGNOSIS — D472 Monoclonal gammopathy: Secondary | ICD-10-CM | POA: Diagnosis not present

## 2019-01-19 DIAGNOSIS — Z79899 Other long term (current) drug therapy: Secondary | ICD-10-CM | POA: Diagnosis not present

## 2019-01-19 DIAGNOSIS — E1122 Type 2 diabetes mellitus with diabetic chronic kidney disease: Secondary | ICD-10-CM | POA: Diagnosis not present

## 2019-01-19 DIAGNOSIS — I129 Hypertensive chronic kidney disease with stage 1 through stage 4 chronic kidney disease, or unspecified chronic kidney disease: Secondary | ICD-10-CM | POA: Diagnosis not present

## 2019-01-19 DIAGNOSIS — E039 Hypothyroidism, unspecified: Secondary | ICD-10-CM | POA: Diagnosis not present

## 2019-01-19 DIAGNOSIS — N183 Chronic kidney disease, stage 3 (moderate): Secondary | ICD-10-CM | POA: Diagnosis not present

## 2019-01-19 DIAGNOSIS — D508 Other iron deficiency anemias: Secondary | ICD-10-CM

## 2019-01-19 MED ORDER — SODIUM CHLORIDE 0.9 % IV SOLN
Freq: Once | INTRAVENOUS | Status: AC
  Administered 2019-01-19: 15:00:00 via INTRAVENOUS

## 2019-01-19 MED ORDER — SODIUM CHLORIDE 0.9 % IV SOLN
510.0000 mg | Freq: Once | INTRAVENOUS | Status: AC
  Administered 2019-01-19: 510 mg via INTRAVENOUS
  Filled 2019-01-19: qty 17

## 2019-01-19 MED ORDER — SODIUM CHLORIDE 0.9% FLUSH
10.0000 mL | Freq: Once | INTRAVENOUS | Status: AC | PRN
  Administered 2019-01-19: 10 mL

## 2019-01-19 NOTE — Patient Instructions (Signed)
[facility]  Discharge Instructions:   _______________________________________________________________  Thank you for choosing [facility] at [facility] to provide your oncology and hematology care.  To afford each patient quality time with our providers, please arrive at least 15 minutes before your scheduled appointment.  You need to re-schedule your appointment if you arrive 10 or more minutes late.  We strive to give you quality time with our providers, and arriving late affects you and other patients whose appointments are after yours.  Also, if you no show three or more times for appointments you may be dismissed from the clinic.  Again, thank you for choosing [facility] at [facility]. Our hope is that these requests will allow you access to exceptional care and in a timely manner. _______________________________________________________________  If you have questions after your visit, please contact our office at [phone] between the hours of 8:30 a.m. and 5:00 p.m. Voicemails left after 4:30 p.m. will not be returned until the following business day. _______________________________________________________________  For prescription refill requests, have your pharmacy contact our office. _______________________________________________________________  Recommendations made by the consultant and any test results will be sent to your referring physician. _______________________________________________________________ 

## 2019-01-19 NOTE — Progress Notes (Signed)
Patient tolerated iron infusion with no complaints voiced.  Good blood return noted before and after infusion.  Band aid applied.  VSS with discharge and left ambulatory with no s/s of distress noted.

## 2019-01-26 ENCOUNTER — Other Ambulatory Visit: Payer: Self-pay | Admitting: Family Medicine

## 2019-01-26 ENCOUNTER — Encounter (HOSPITAL_COMMUNITY): Payer: Self-pay

## 2019-01-26 ENCOUNTER — Inpatient Hospital Stay (HOSPITAL_COMMUNITY): Payer: Medicare Other

## 2019-01-26 ENCOUNTER — Other Ambulatory Visit: Payer: Self-pay | Admitting: Orthopedic Surgery

## 2019-01-26 VITALS — BP 160/59 | HR 66 | Temp 98.5°F | Resp 18

## 2019-01-26 DIAGNOSIS — I129 Hypertensive chronic kidney disease with stage 1 through stage 4 chronic kidney disease, or unspecified chronic kidney disease: Secondary | ICD-10-CM | POA: Diagnosis not present

## 2019-01-26 DIAGNOSIS — D508 Other iron deficiency anemias: Secondary | ICD-10-CM

## 2019-01-26 DIAGNOSIS — N183 Chronic kidney disease, stage 3 (moderate): Secondary | ICD-10-CM | POA: Diagnosis not present

## 2019-01-26 DIAGNOSIS — E1122 Type 2 diabetes mellitus with diabetic chronic kidney disease: Secondary | ICD-10-CM | POA: Diagnosis not present

## 2019-01-26 DIAGNOSIS — Z79899 Other long term (current) drug therapy: Secondary | ICD-10-CM | POA: Diagnosis not present

## 2019-01-26 DIAGNOSIS — D509 Iron deficiency anemia, unspecified: Secondary | ICD-10-CM | POA: Diagnosis not present

## 2019-01-26 DIAGNOSIS — D472 Monoclonal gammopathy: Secondary | ICD-10-CM | POA: Diagnosis not present

## 2019-01-26 DIAGNOSIS — E039 Hypothyroidism, unspecified: Secondary | ICD-10-CM | POA: Diagnosis not present

## 2019-01-26 DIAGNOSIS — D631 Anemia in chronic kidney disease: Secondary | ICD-10-CM | POA: Diagnosis not present

## 2019-01-26 MED ORDER — SODIUM CHLORIDE 0.9 % IV SOLN
Freq: Once | INTRAVENOUS | Status: AC
  Administered 2019-01-26: 15:00:00 via INTRAVENOUS

## 2019-01-26 MED ORDER — SODIUM CHLORIDE 0.9 % IV SOLN
510.0000 mg | Freq: Once | INTRAVENOUS | Status: AC
  Administered 2019-01-26: 510 mg via INTRAVENOUS
  Filled 2019-01-26: qty 510

## 2019-01-26 NOTE — Patient Instructions (Signed)
[facility] Discharge Instructions  Received Feraheme infusion today. Follow-up as scheduled. Call clinic for any questions or concerns   Thank you for choosing [facility] at [facility] to provide your oncology and hematology care.  To afford each patient quality time with our provider, please arrive at least 15 minutes before your scheduled appointment time.   If you have a lab appointment with the Cancer Center please come in thru the  Main Entrance and check in at the main information desk  You need to re-schedule your appointment should you arrive 10 or more minutes late.  We strive to give you quality time with our providers, and arriving late affects you and other patients whose appointments are after yours.  Also, if you no show three or more times for appointments you may be dismissed from the clinic at the providers discretion.     Again, thank you for choosing [facility].  Our hope is that these requests will decrease the amount of time that you wait before being seen by our physicians.       _____________________________________________________________  Should you have questions after your visit to [facility], please contact our office at [phone] between the hours of 8:00 a.m. and 4:30 p.m.  Voicemails left after 4:00 p.m. will not be returned until the following business day.  For prescription refill requests, have your pharmacy contact our office and allow 72 hours.    Cancer Center Support Programs:   > Cancer Support Group  2nd Tuesday of the month 1pm-2pm, Journey Room   

## 2019-01-26 NOTE — Progress Notes (Signed)
[name] tolerated Feraheme infusion well without complaints or incident. VSS upon discharge. Pt discharged via wheelchair in satisfactory condition

## 2019-01-28 ENCOUNTER — Other Ambulatory Visit: Payer: Self-pay | Admitting: Family Medicine

## 2019-02-12 ENCOUNTER — Other Ambulatory Visit: Payer: Self-pay | Admitting: Orthopedic Surgery

## 2019-02-12 ENCOUNTER — Other Ambulatory Visit: Payer: Self-pay | Admitting: Family Medicine

## 2019-02-17 NOTE — Patient Instructions (Signed)
DARNITA WOODRUM  02/17/2019     @PREFPERIOPPHARMACY @   Your procedure is scheduled on  02/26/2019  Report to [facility] at  58   A.M.  Call this number if you have problems the morning of surgery:  [phone]   Remember:  Follow the diet and prep instructions given to you by Dr Roseanne Kaufman office.                        Take these medicines the morning of surgery with A SIP OF WATER amlodipine, zyrtec, celexa, diltiazem, zofran, protonix, effexor. Take 1/2 of your night time insulin the night before your procedure. DO NOT take any medications for diabetes the morning of your procedure.    Do not wear jewelry, make-up or nail polish.  Do not wear lotions, powders, or perfumes, or deodorant.  Do not shave 48 hours prior to surgery.  Men may shave face and neck.  Do not bring valuables to the hospital.  [facility] is not responsible for any belongings or valuables.  Contacts, dentures or bridgework may not be worn into surgery.  Leave your suitcase in the car.  After surgery it may be brought to your room.  For patients admitted to the hospital, discharge time will be determined by your treatment team.  Patients discharged the day of surgery will not be allowed to drive home.   Name and phone number of your driver:   family Special instructions:  None.  Please read over the following fact sheets that you were given. Anesthesia Post-op Instructions and Care and Recovery After Surgery       Colonoscopy, Adult A colonoscopy is an exam to look at the large intestine. It is done to check for problems, such as:  Lumps (tumors).  Growths (polyps).  Swelling (inflammation).  Bleeding. What happens before the procedure? Eating and drinking Follow instructions from your doctor about eating and drinking. These instructions may include:  A few days before the procedure - follow a low-fiber diet. ? Avoid nuts. ? Avoid seeds. ? Avoid dried fruit. ? Avoid raw  fruits. ? Avoid vegetables.  1-3 days before the procedure - follow a clear liquid diet. Avoid liquids that have red or purple dye. Drink only clear liquids, such as: ? Clear broth or bouillon. ? Black coffee or tea. ? Clear juice. ? Clear soft drinks or sports drinks. ? Gelatin dessert. ? Popsicles.  On the day of the procedure - do not eat or drink anything during the 2 hours before the procedure. Up to 2 hours before the procedure, you may continue to drink clear liquids, such as water or clear fruit juice.  Bowel prep If you were prescribed an oral bowel prep:  Take it as told by your doctor. Starting the day before your procedure, you will need to drink a lot of liquid. The liquid will cause you to poop (have bowel movements) until your poop is almost clear or light green.  To clean out your colon, you may also be given: ? Laxative medicines. ? Instructions about how to use an enema.  If your skin or butt gets irritated from diarrhea, you may: ? Wipe the area with wipes that have medicine in them, such as adult wet wipes with aloe and vitamin E. ? Put something on your skin that soothes the area, such as petroleum jelly.  If you throw up (vomit) while  drinking the bowel prep, take a break for up to 60 minutes. Then begin the bowel prep again. If you keep throwing up and you cannot take the bowel prep without throwing up, call your doctor. General instructions  Ask your doctor about: ? Changing or stopping your normal medicines. This is important if you take iron pills, diabetes medicines, or blood thinners. ? Taking medicines such as aspirin and ibuprofen. These medicines can thin your blood. Do not take these medicines unless your doctor tells you to take them.  Plan to have someone take you home from the hospital or clinic. What happens during the procedure?   An IV tube may be put into one of your veins.  You will be given medicine to help you relax (sedative).  To  reduce your risk of infection: ? Your doctors will wash their hands. ? Your anal area will be washed with soap.  You will be asked to lie on your side with your knees bent.  Your doctor will get a long, thin, flexible tube ready. The tube will have a camera and a light on the end.  The tube will be put into your anus.  The tube will be gently put into your large intestine.  Air will be delivered into your large intestine to keep it open. You may feel some pressure or cramping.  The camera will be used to take photos.  A small tissue sample may be removed for testing (biopsy).  If small growths are found, your doctor may remove them and have them checked for cancer.  The tube that was put into your anus will be slowly removed. The procedure may vary among doctors and hospitals. What happens after the procedure?  Your doctor will check on you often until the medicines you were given have worn off.  Do not drive for 24 hours after the procedure.  You may have a small amount of blood in your poop.  You may pass gas.  You may have mild cramps or bloating in your belly (abdomen).  It is up to you to get the results of your procedure. Ask your doctor, or the department performing the procedure, when your results will be ready. Summary  A colonoscopy is an exam to look at the large intestine.  Follow instructions from your doctor about eating and drinking before the procedure.  If you were prescribed an oral bowel prep to clean out your colon, take it as told by your doctor.  Your doctor will check on you often until the medicines you were given have worn off.  Plan to have someone take you home from the hospital or clinic. This information is not intended to replace advice given to you by your health care provider. Make sure you discuss any questions you have with your health care provider. Document Released: 01/19/2011 Document Revised: 10/16/2017 Document Reviewed:  02/28/2016 Elsevier Interactive Patient Education  2019 [employer]  Colonoscopy, Adult, Care After This sheet gives you information about how to care for yourself after your procedure. Your health care provider may also give you more specific instructions. If you have problems or questions, contact your health care provider. What can I expect after the procedure? After the procedure, it is common to have:  A small amount of blood in your stool for 24 hours after the procedure.  Some gas.  Mild abdominal cramping or bloating. Follow these instructions at home: General instructions  For the first 24 hours after the procedure: ?  Do not drive or use machinery. ? Do not sign important documents. ? Do not drink alcohol. ? Do your regular daily activities at a slower pace than normal. ? Eat soft, easy-to-digest foods.  Take over-the-counter or prescription medicines only as told by your health care provider. Relieving cramping and bloating   Try walking around when you have cramps or feel bloated.  Apply heat to your abdomen as told by your health care provider. Use a heat source that your health care provider recommends, such as a moist heat pack or a heating pad. ? Place a towel between your skin and the heat source. ? Leave the heat on for 20-30 minutes. ? Remove the heat if your skin turns bright red. This is especially important if you are unable to feel pain, heat, or cold. You may have a greater risk of getting burned. Eating and drinking   Drink enough fluid to keep your urine pale yellow.  Resume your normal diet as instructed by your health care provider. Avoid heavy or fried foods that are hard to digest.  Avoid drinking alcohol for as long as instructed by your health care provider. Contact a health care provider if:  You have blood in your stool 2-3 days after the procedure. Get help right away if:  You have more than a small spotting of blood in your  stool.  You pass large blood clots in your stool.  Your abdomen is swollen.  You have nausea or vomiting.  You have a fever.  You have increasing abdominal pain that is not relieved with medicine. Summary  After the procedure, it is common to have a small amount of blood in your stool. You may also have mild abdominal cramping and bloating.  For the first 24 hours after the procedure, do not drive or use machinery, sign important documents, or drink alcohol.  Contact your health care provider if you have a lot of blood in your stool, nausea or vomiting, a fever, or increased abdominal pain. This information is not intended to replace advice given to you by your health care provider. Make sure you discuss any questions you have with your health care provider. Document Released: 07/31/2004 Document Revised: 10/09/2017 Document Reviewed: 02/28/2016 Elsevier Interactive Patient Education  2019 [place] Anesthesia is a term that refers to techniques, procedures, and medicines that help a person stay safe and comfortable during a medical procedure. Monitored anesthesia care, or sedation, is one type of anesthesia. Your anesthesia specialist may recommend sedation if you will be having a procedure that does not require you to be unconscious, such as:  Cataract surgery.  A dental procedure.  A biopsy.  A colonoscopy. During the procedure, you may receive a medicine to help you relax (sedative). There are three levels of sedation:  Mild sedation. At this level, you may feel awake and relaxed. You will be able to follow directions.  Moderate sedation. At this level, you will be sleepy. You may not remember the procedure.  Deep sedation. At this level, you will be asleep. You will not remember the procedure. The more medicine you are given, the deeper your level of sedation will be. Depending on how you respond to the procedure, the anesthesia specialist  may change your level of sedation or the type of anesthesia to fit your needs. An anesthesia specialist will monitor you closely during the procedure. Let your health care provider know about:  Any allergies you have.  All  medicines you are taking, including vitamins, herbs, eye drops, creams, and over-the-counter medicines.  Any use of steroids (by mouth or as a cream).  Any problems you or family members have had with sedatives and anesthetic medicines.  Any blood disorders you have.  Any surgeries you have had.  Any medical conditions you have, such as sleep apnea.  Whether you are pregnant or may be pregnant.  Any use of cigarettes, alcohol, or street drugs. What are the risks? Generally, this is a safe procedure. However, problems may occur, including:  Getting too much medicine (oversedation).  Nausea.  Allergic reaction to medicines.  Trouble breathing. If this happens, a breathing tube may be used to help with breathing. It will be removed when you are awake and breathing on your own.  Heart trouble.  Lung trouble. Before the procedure Staying hydrated Follow instructions from your health care provider about hydration, which may include:  Up to 2 hours before the procedure - you may continue to drink clear liquids, such as water, clear fruit juice, black coffee, and plain tea. Eating and drinking restrictions Follow instructions from your health care provider about eating and drinking, which may include:  8 hours before the procedure - stop eating heavy meals or foods such as meat, fried foods, or fatty foods.  6 hours before the procedure - stop eating light meals or foods, such as toast or cereal.  6 hours before the procedure - stop drinking milk or drinks that contain milk.  2 hours before the procedure - stop drinking clear liquids. Medicines Ask your health care provider about:  Changing or stopping your regular medicines. This is especially important  if you are taking diabetes medicines or blood thinners.  Taking medicines such as aspirin and ibuprofen. These medicines can thin your blood. Do not take these medicines before your procedure if your health care provider instructs you not to. Tests and exams  You will have a physical exam.  You may have blood tests done to show: ? How well your kidneys and liver are working. ? How well your blood can clot. General instructions  Plan to have someone take you home from the hospital or clinic.  If you will be going home right after the procedure, plan to have someone with you for 24 hours.  What happens during the procedure?  Your blood pressure, heart rate, breathing, level of pain and overall condition will be monitored.  An IV tube will be inserted into one of your veins.  Your anesthesia specialist will give you medicines as needed to keep you comfortable during the procedure. This may mean changing the level of sedation.  The procedure will be performed. After the procedure  Your blood pressure, heart rate, breathing rate, and blood oxygen level will be monitored until the medicines you were given have worn off.  Do not drive for 24 hours if you received a sedative.  You may: ? Feel sleepy, clumsy, or nauseous. ? Feel forgetful about what happened after the procedure. ? Have a sore throat if you had a breathing tube during the procedure. ? Vomit. This information is not intended to replace advice given to you by your health care provider. Make sure you discuss any questions you have with your health care provider. Document Released: 09/12/2005 Document Revised: 05/25/2016 Document Reviewed: 04/08/2016 Elsevier Interactive Patient Education  2019 [place], Care After These instructions provide you with information about caring for yourself after your procedure. Your  health care provider may also give you more specific instructions. Your  treatment has been planned according to current medical practices, but problems sometimes occur. Call your health care provider if you have any problems or questions after your procedure. What can I expect after the procedure? After your procedure, you may:  Feel sleepy for several hours.  Feel clumsy and have poor balance for several hours.  Feel forgetful about what happened after the procedure.  Have poor judgment for several hours.  Feel nauseous or vomit.  Have a sore throat if you had a breathing tube during the procedure. Follow these instructions at home: For at least 24 hours after the procedure:      Have a responsible adult stay with you. It is important to have someone help care for you until you are awake and alert.  Rest as needed.  Do not: ? Participate in activities in which you could fall or become injured. ? Drive. ? Use heavy machinery. ? Drink alcohol. ? Take sleeping pills or medicines that cause drowsiness. ? Make important decisions or sign legal documents. ? Take care of children on your own. Eating and drinking  Follow the diet that is recommended by your health care provider.  If you vomit, drink water, juice, or soup when you can drink without vomiting.  Make sure you have little or no nausea before eating solid foods. General instructions  Take over-the-counter and prescription medicines only as told by your health care provider.  If you have sleep apnea, surgery and certain medicines can increase your risk for breathing problems. Follow instructions from your health care provider about wearing your sleep device: ? Anytime you are sleeping, including during daytime naps. ? While taking prescription pain medicines, sleeping medicines, or medicines that make you drowsy.  If you smoke, do not smoke without supervision.  Keep all follow-up visits as told by your health care provider. This is important. Contact a health care provider  if:  You keep feeling nauseous or you keep vomiting.  You feel light-headed.  You develop a rash.  You have a fever. Get help right away if:  You have trouble breathing. Summary  For several hours after your procedure, you may feel sleepy and have poor judgment.  Have a responsible adult stay with you for at least 24 hours or until you are awake and alert. This information is not intended to replace advice given to you by your health care provider. Make sure you discuss any questions you have with your health care provider. Document Released: 04/08/2016 Document Revised: 08/02/2017 Document Reviewed: 04/08/2016 Elsevier Interactive Patient Education  2019 [employer].

## 2019-02-18 DIAGNOSIS — H401132 Primary open-angle glaucoma, bilateral, moderate stage: Secondary | ICD-10-CM | POA: Diagnosis not present

## 2019-02-19 ENCOUNTER — Encounter (HOSPITAL_COMMUNITY)
Admission: RE | Admit: 2019-02-19 | Discharge: 2019-02-19 | Disposition: A | Discharge status: Home / Self Care | Payer: Medicare Other | Source: Ambulatory Visit | Attending: Internal Medicine | Admitting: Internal Medicine

## 2019-02-19 ENCOUNTER — Encounter (HOSPITAL_COMMUNITY): Payer: Self-pay

## 2019-02-19 ENCOUNTER — Other Ambulatory Visit: Payer: Self-pay

## 2019-02-19 DIAGNOSIS — Z01812 Encounter for preprocedural laboratory examination: Secondary | ICD-10-CM | POA: Insufficient documentation

## 2019-02-19 HISTORY — DX: Chronic obstructive pulmonary disease, unspecified: J44.9

## 2019-02-19 LAB — CBC
HEMATOCRIT: 35.3 % — AB (ref 36.0–46.0)
Hemoglobin: 10.5 g/dL — ABNORMAL LOW (ref 12.0–15.0)
MCH: 27.6 pg (ref 26.0–34.0)
MCHC: 29.7 g/dL — ABNORMAL LOW (ref 30.0–36.0)
MCV: 92.9 fL (ref 80.0–100.0)
Platelets: 269 10*3/uL (ref 150–400)
RBC: 3.8 MIL/uL — ABNORMAL LOW (ref 3.87–5.11)
RDW: 16.9 % — AB (ref 11.5–15.5)
WBC: 7.8 10*3/uL (ref 4.0–10.5)
nRBC: 0 % (ref 0.0–0.2)

## 2019-02-24 ENCOUNTER — Encounter (INDEPENDENT_AMBULATORY_CARE_PROVIDER_SITE_OTHER): Payer: Medicare Other | Admitting: Ophthalmology

## 2019-02-24 DIAGNOSIS — I10 Essential (primary) hypertension: Secondary | ICD-10-CM

## 2019-02-24 DIAGNOSIS — H35033 Hypertensive retinopathy, bilateral: Secondary | ICD-10-CM

## 2019-02-24 DIAGNOSIS — E11319 Type 2 diabetes mellitus with unspecified diabetic retinopathy without macular edema: Secondary | ICD-10-CM

## 2019-02-24 DIAGNOSIS — E113393 Type 2 diabetes mellitus with moderate nonproliferative diabetic retinopathy without macular edema, bilateral: Secondary | ICD-10-CM

## 2019-02-24 DIAGNOSIS — H43813 Vitreous degeneration, bilateral: Secondary | ICD-10-CM

## 2019-02-24 DIAGNOSIS — H348112 Central retinal vein occlusion, right eye, stable: Secondary | ICD-10-CM | POA: Diagnosis not present

## 2019-02-24 LAB — HM DIABETES EYE EXAM

## 2019-02-26 ENCOUNTER — Ambulatory Visit (HOSPITAL_COMMUNITY): Payer: Medicare Other | Admitting: Anesthesiology

## 2019-02-26 ENCOUNTER — Encounter (HOSPITAL_COMMUNITY): Payer: Self-pay | Admitting: *Deleted

## 2019-02-26 ENCOUNTER — Encounter (HOSPITAL_COMMUNITY)
Admission: RE | Disposition: A | Discharge status: Home / Self Care | Payer: Self-pay | Source: Ambulatory Visit | Attending: Gastroenterology

## 2019-02-26 ENCOUNTER — Ambulatory Visit (HOSPITAL_COMMUNITY)
Admission: RE | Admit: 2019-02-26 | Discharge: 2019-02-26 | Disposition: A | Discharge status: Home / Self Care | Payer: Medicare Other | Source: Ambulatory Visit | Attending: Gastroenterology | Admitting: Gastroenterology

## 2019-02-26 DIAGNOSIS — I129 Hypertensive chronic kidney disease with stage 1 through stage 4 chronic kidney disease, or unspecified chronic kidney disease: Secondary | ICD-10-CM | POA: Insufficient documentation

## 2019-02-26 DIAGNOSIS — Z8249 Family history of ischemic heart disease and other diseases of the circulatory system: Secondary | ICD-10-CM | POA: Insufficient documentation

## 2019-02-26 DIAGNOSIS — F419 Anxiety disorder, unspecified: Secondary | ICD-10-CM | POA: Diagnosis not present

## 2019-02-26 DIAGNOSIS — K219 Gastro-esophageal reflux disease without esophagitis: Secondary | ICD-10-CM | POA: Diagnosis not present

## 2019-02-26 DIAGNOSIS — K644 Residual hemorrhoidal skin tags: Secondary | ICD-10-CM | POA: Diagnosis not present

## 2019-02-26 DIAGNOSIS — G7000 Myasthenia gravis without (acute) exacerbation: Secondary | ICD-10-CM | POA: Insufficient documentation

## 2019-02-26 DIAGNOSIS — I272 Pulmonary hypertension, unspecified: Secondary | ICD-10-CM | POA: Diagnosis not present

## 2019-02-26 DIAGNOSIS — Z79899 Other long term (current) drug therapy: Secondary | ICD-10-CM | POA: Insufficient documentation

## 2019-02-26 DIAGNOSIS — D649 Anemia, unspecified: Secondary | ICD-10-CM | POA: Diagnosis not present

## 2019-02-26 DIAGNOSIS — K449 Diaphragmatic hernia without obstruction or gangrene: Secondary | ICD-10-CM | POA: Insufficient documentation

## 2019-02-26 DIAGNOSIS — Z7951 Long term (current) use of inhaled steroids: Secondary | ICD-10-CM | POA: Diagnosis not present

## 2019-02-26 DIAGNOSIS — K648 Other hemorrhoids: Secondary | ICD-10-CM | POA: Diagnosis not present

## 2019-02-26 DIAGNOSIS — D122 Benign neoplasm of ascending colon: Secondary | ICD-10-CM

## 2019-02-26 DIAGNOSIS — G4733 Obstructive sleep apnea (adult) (pediatric): Secondary | ICD-10-CM | POA: Diagnosis not present

## 2019-02-26 DIAGNOSIS — J449 Chronic obstructive pulmonary disease, unspecified: Secondary | ICD-10-CM | POA: Insufficient documentation

## 2019-02-26 DIAGNOSIS — K625 Hemorrhage of anus and rectum: Secondary | ICD-10-CM

## 2019-02-26 DIAGNOSIS — M479 Spondylosis, unspecified: Secondary | ICD-10-CM | POA: Diagnosis not present

## 2019-02-26 DIAGNOSIS — F329 Major depressive disorder, single episode, unspecified: Secondary | ICD-10-CM | POA: Diagnosis not present

## 2019-02-26 DIAGNOSIS — K921 Melena: Secondary | ICD-10-CM | POA: Diagnosis not present

## 2019-02-26 DIAGNOSIS — K5730 Diverticulosis of large intestine without perforation or abscess without bleeding: Secondary | ICD-10-CM | POA: Diagnosis not present

## 2019-02-26 DIAGNOSIS — Z794 Long term (current) use of insulin: Secondary | ICD-10-CM | POA: Diagnosis not present

## 2019-02-26 DIAGNOSIS — Z87891 Personal history of nicotine dependence: Secondary | ICD-10-CM | POA: Insufficient documentation

## 2019-02-26 DIAGNOSIS — D631 Anemia in chronic kidney disease: Secondary | ICD-10-CM | POA: Diagnosis not present

## 2019-02-26 DIAGNOSIS — E079 Disorder of thyroid, unspecified: Secondary | ICD-10-CM | POA: Insufficient documentation

## 2019-02-26 DIAGNOSIS — E785 Hyperlipidemia, unspecified: Secondary | ICD-10-CM | POA: Diagnosis not present

## 2019-02-26 DIAGNOSIS — N183 Chronic kidney disease, stage 3 (moderate): Secondary | ICD-10-CM | POA: Diagnosis not present

## 2019-02-26 DIAGNOSIS — D508 Other iron deficiency anemias: Secondary | ICD-10-CM

## 2019-02-26 DIAGNOSIS — E1122 Type 2 diabetes mellitus with diabetic chronic kidney disease: Secondary | ICD-10-CM | POA: Insufficient documentation

## 2019-02-26 HISTORY — PX: COLONOSCOPY WITH PROPOFOL: SHX5780

## 2019-02-26 HISTORY — PX: POLYPECTOMY: SHX5525

## 2019-02-26 LAB — GLUCOSE, CAPILLARY
Glucose-Capillary: 53 mg/dL — ABNORMAL LOW (ref 70–99)
Glucose-Capillary: 94 mg/dL (ref 70–99)
Glucose-Capillary: 98 mg/dL (ref 70–99)

## 2019-02-26 SURGERY — COLONOSCOPY WITH PROPOFOL
Anesthesia: Monitor Anesthesia Care

## 2019-02-26 MED ORDER — DEXTROSE 50 % IV SOLN
INTRAVENOUS | Status: AC
  Filled 2019-02-26: qty 50

## 2019-02-26 MED ORDER — PROPOFOL 10 MG/ML IV BOLUS
INTRAVENOUS | Status: AC
  Filled 2019-02-26: qty 60

## 2019-02-26 MED ORDER — LIDOCAINE HCL (CARDIAC) PF 100 MG/5ML IV SOSY
PREFILLED_SYRINGE | INTRAVENOUS | Status: DC | PRN
  Administered 2019-02-26: 40 mg via INTRAVENOUS

## 2019-02-26 MED ORDER — KETAMINE HCL 50 MG/5ML IJ SOSY
PREFILLED_SYRINGE | INTRAMUSCULAR | Status: AC
  Filled 2019-02-26: qty 5

## 2019-02-26 MED ORDER — DEXTROSE 50 % IV SOLN
25.0000 mL | Freq: Once | INTRAVENOUS | Status: AC
  Administered 2019-02-26: 25 mL via INTRAVENOUS

## 2019-02-26 MED ORDER — ONDANSETRON HCL 4 MG/2ML IJ SOLN
INTRAMUSCULAR | Status: AC
  Filled 2019-02-26: qty 2

## 2019-02-26 MED ORDER — ONDANSETRON HCL 4 MG/2ML IJ SOLN
4.0000 mg | Freq: Once | INTRAMUSCULAR | Status: AC
  Administered 2019-02-26: 4 mg via INTRAVENOUS

## 2019-02-26 MED ORDER — LACTATED RINGERS IV SOLN: INTRAVENOUS | Status: DC

## 2019-02-26 MED ORDER — PROPOFOL 500 MG/50ML IV EMUL
INTRAVENOUS | Status: DC | PRN
  Administered 2019-02-26: 150 ug/kg/min via INTRAVENOUS
  Administered 2019-02-26: 10:00:00 via INTRAVENOUS

## 2019-02-26 MED ORDER — STERILE WATER FOR IRRIGATION IR SOLN
Status: DC | PRN
  Administered 2019-02-26: 100 mL

## 2019-02-26 MED ORDER — PROPOFOL 10 MG/ML IV BOLUS
INTRAVENOUS | Status: DC | PRN
  Administered 2019-02-26 (×2): 20 mg via INTRAVENOUS

## 2019-02-26 MED ORDER — LACTATED RINGERS IV SOLN
INTRAVENOUS | Status: DC | PRN
  Administered 2019-02-26: 09:00:00 via INTRAVENOUS

## 2019-02-26 MED ORDER — CHLORHEXIDINE GLUCONATE CLOTH 2 % EX PADS: 6.0000 | MEDICATED_PAD | Freq: Once | CUTANEOUS | Status: DC

## 2019-02-26 MED ORDER — GLYCOPYRROLATE 0.2 MG/ML IJ SOLN
INTRAMUSCULAR | Status: DC | PRN
  Administered 2019-02-26: 0.2 mg via INTRAVENOUS

## 2019-02-26 NOTE — H&P (Signed)
Primary Care Physician:  Fayrene Helper, MD Primary Gastroenterologist:  Dr. Oneida Alar  Pre-Procedure History & Physical: HPI:  [name] is a 78 y.o. female here for Anemia/BRBPR.  Past Medical History:  Diagnosis Date  . Anemia in chronic renal disease 12/30/2015  . Anxiety   . Arthritis    "all over" (01/19/2014)  . Arthritis, lumbar spine   . Carpal tunnel syndrome   . Chronic bronchitis ([place])    "got it q yr for awhile; hasn't had it in awhile" (01/19/2014)  . Chronic kidney disease (CKD) stage G3b/A1, moderately decreased glomerular filtration rate (GFR) between 30-44 mL/min/1.73 square meter and albuminuria creatinine ratio less than 30 mg/g (HCC) 09/14/2009   Qualifier: Diagnosis of  By: Moshe Cipro MD, Joycelyn Schmid    . Chronic renal disease, stage 3, moderately decreased glomerular filtration rate (GFR) between 30-59 mL/min/1.73 square meter (HCC) 09/14/2009   Qualifier: Diagnosis of  By: Moshe Cipro MD, Joycelyn Schmid    . Complication of anesthesia    combative  . COPD (chronic obstructive pulmonary disease) ([place])   . Depression   . Diverticulosis 07/2004   Colonscopy Dr Gala Romney  . Esophagitis, erosive 2009  . Exertional shortness of breath   . Gastroesophageal reflux   . OFBPZWCH(852.7)    "usually a couple times/wk" (01/19/2014)  . Heart murmur    saw cardiology In [place], he told her she did not need to come back.  . Hyperlipidemia   . Hypertension   . Impingement syndrome, shoulder   . Iron deficiency anemia   . Mitral regurgitation   . Myasthenia gravis   . Myasthenia gravis in remission ([place]) 11/24/2014  . Myasthenia gravis in remission ([place])   . Obesity   . OSA on CPAP    Negative on last sleep study  . Pneumonia 01/2012  . Pulmonary HTN ([place])   . Schatzki's ring    Last EGD w/ dilation 02/08/11, 2009 & 2007  . Seasonal allergies   . Shoulder pain   . Thyroid disease    "used to take RX; they took me off it" (01/19/2014)  . Tobacco abuse   . Type II diabetes  mellitus ([place])     Past Surgical History:  Procedure Laterality Date  . [place] VITRECTOMY WITH 20 GAUGE MVR PORT Left 01/19/2014   Procedure: 25 GAUGE PARS PLANA VITRECTOMY WITH 20 GAUGE MVR PORT; MEMBRAME PEEL; SERUM PATCH; LASER TREATMENT; C3F8;  Surgeon: Hayden Pedro, MD;  Location: [facility];  Service: Ophthalmology;  Laterality: Left;  . ABDOMINAL HYSTERECTOMY    . APPENDECTOMY    . CARPAL TUNNEL RELEASE Bilateral   . CATARACT EXTRACTION W/PHACO Left 10/21/2013   Procedure: LEFT CATARACT EXTRACTION PHACO AND INTRAOCULAR LENS PLACEMENT (IOC);  Surgeon: Marylynn Pearson, MD;  Location: [facility];  Service: Ophthalmology;  Laterality: Left;  . CHOLECYSTECTOMY    . COLONOSCOPY  05/08/2012   POE:UMPNTIRW and external hemorrhoids; colonic diverticulosis  . ESOPHAGEAL DILATION     "more than 3 times" (01/19/2014)  . ESOPHAGOGASTRODUODENOSCOPY  02/08/11   [place]-Distal esophageal erosion consistent with mild erosive reflux   esophagitis/ Noncritical Schatzki ring, small hiatal hernia otherwise upper/ gastrointestinal tract appeared unremarkable, status post passage  of a Maloney dilation to biopsy disruption of the ring described  . ESOPHAGOGASTRODUODENOSCOPY  04/2012   2 tandem incomplete distal esophagea rings s/p dilation.   . ESOPHAGOGASTRODUODENOSCOPY N/A 09/16/2014   Dr. Gala Romney: Schatzki ring status post dilation/disruption.  Hiatal hernia.  Marland Kitchen ESOPHAGOGASTRODUODENOSCOPY (EGD) WITH PROPOFOL  N/A 12/08/2018   EGD with mild Schatzki ring s/p dilation, small hiatal hernia, otherwise normal  . EYE SURGERY     cataracts, bilateral  . INCONTINENCE SURGERY  08/26/09   [place]  . JOINT REPLACEMENT     right total knee  . MALONEY DILATION N/A 09/16/2014   Procedure: Venia Minks DILATION;  Surgeon: Daneil Dolin, MD;  [facility];  Service: Endoscopy;  Laterality: N/A;  . Venia Minks DILATION N/A 12/08/2018   Procedure: Venia Minks DILATION;  Surgeon: Daneil Dolin, MD;  [facility];   Service: Endoscopy;  Laterality: N/A;  . PARS PLANA VITRECTOMY W/ REPAIR OF MACULAR HOLE Left 01/19/2014  . SAVORY DILATION N/A 09/16/2014   Procedure: SAVORY DILATION;  Surgeon: Daneil Dolin, MD;  [facility];  Service: Endoscopy;  Laterality: N/A;  . TONSILLECTOMY    . TOTAL KNEE ARTHROPLASTY Right 05/13/07   Dr. Aline Brochure  . TRIGGER FINGER RELEASE Right 06/26/2018   Procedure: RIGHT INDEX FINGER TRIGGER FINGER/A-1 PULLEY RELEASE;  Surgeon: Carole Civil, MD;  Location: [facility];  Service: Orthopedics;  Laterality: Right;    Prior to Admission medications   Medication Sig Start Date End Date Taking? Authorizing Provider  amLODipine (NORVASC) 10 MG tablet TAKE 1 TABLET BY MOUTH ONCE A DAY. 01/26/19  Yes Fayrene Helper, MD  cetirizine (ZYRTEC) 10 MG tablet TAKE 1 TABLET BY MOUTH ONCE A DAY. 01/26/19  Yes Fayrene Helper, MD  citalopram (CELEXA) 20 MG tablet TAKE 1 TABLET BY MOUTH ONCE A DAY. 02/12/19  Yes Fayrene Helper, MD  ezetimibe (ZETIA) 10 MG tablet Take 1 tablet (10 mg total) by mouth daily. 10/19/18 10/20/19 Yes Fayrene Helper, MD  pantoprazole (PROTONIX) 40 MG tablet Take 1 tablet (40 mg total) by mouth 2 (two) times daily before a meal. 11/03/18  Yes Mahala Menghini, PA-C  rosuvastatin (CRESTOR) 40 MG tablet TAKE (1) TABLET BY MOUTH AT BEDTIME. 01/26/19  Yes Fayrene Helper, MD  spironolactone (ALDACTONE) 25 MG tablet TAKE 1 TABLET BY MOUTH ONCE A DAY. 02/12/19  Yes Fayrene Helper, MD  venlafaxine XR (EFFEXOR-XR) 150 MG 24 hr capsule Take 1 capsule (150 mg total) by mouth daily with breakfast. 01/26/19  Yes Fayrene Helper, MD  acetaminophen (TYLENOL) 500 MG tablet Take 500 mg by mouth at bedtime.    [provider]  B-D ULTRAFINE III SHORT PEN 31G X 8 MM MISC USE ONCE DAILY WITH LANTUS SOLOSTAR PEN. 01/28/19   Fayrene Helper, MD  brimonidine-timolol (COMBIGAN) 0.2-0.5 % ophthalmic solution Place 1 drop into both eyes every 12  (twelve) hours.    [provider]  Cholecalciferol (VITAMIN D3) 2000 units TABS Take 2,000 Units by mouth at bedtime.    [provider]  Cranberry 1000 MG CAPS Take 1,000 mg by mouth daily.    [provider]  cycloSPORINE (RESTASIS) 0.05 % ophthalmic emulsion Place 1 drop into both eyes 2 (two) times daily.     [provider]  diltiazem (CARDIZEM CD) 240 MG 24 hr capsule TAKE (1) CAPSULE BY MOUTH TWICE DAILY. 02/12/19   Fayrene Helper, MD  docusate sodium (COLACE) 100 MG capsule Take 100 mg by mouth at bedtime.    [provider]  Flaxseed, Linseed, (FLAXSEED OIL) 1000 MG CAPS Take 1,000 mg by mouth daily.    [provider]  fluticasone (FLONASE) 50 MCG/ACT nasal spray PLACE 2 SPRAYS INTO BOTH NOSTRILS DAILY. 12/16/18  Fayrene Helper, MD  gabapentin (NEURONTIN) 300 MG capsule Take 1 capsule (300 mg total) by mouth at bedtime for 1 dose. 01/26/19 01/27/19  Fayrene Helper, MD  hydrALAZINE (APRESOLINE) 25 MG tablet TAKE 1 TABLET BY MOUTH THREE TIMES A DAY. 02/12/19   Fayrene Helper, MD  LANTUS SOLOSTAR 100 UNIT/ML Solostar Pen INJECT 30 UNITS SUBCUTANEOUSLY ONCE DAILY. Patient taking differently: Inject 30 Units into the skin daily.  10/27/18   Fayrene Helper, MD  linagliptin (TRADJENTA) 5 MG TABS tablet Take 1 tablet (5 mg total) by mouth daily. 01/26/19   Fayrene Helper, MD  losartan (COZAAR) 100 MG tablet TAKE (1) TABLET BY MOUTH DAILY. 02/12/19   Fayrene Helper, MD  Multiple Vitamin (MULTIVITAMIN WITH MINERALS) TABS tablet Take 1 tablet by mouth at bedtime.    [provider]  ondansetron (ZOFRAN) 4 MG tablet TAKE (1) TABLET BY MOUTH EVERY FOUR HOURS AS NEEDED FOR NAUSEA. Patient taking differently: Take 4 mg by mouth every 4 (four) hours as needed for nausea or vomiting. TAKE (1) TABLET BY MOUTH EVERY FOUR HOURS AS NEEDED FOR NAUSEA. 11/05/17   Fayrene Helper, MD  [facility] DELICA LANCETS 96E MISC  USE AS DIRECTED 3 TIMES DAILY FOR BLOOD GLUCOSE TESTING. 12/30/18   Fayrene Helper, MD  [facility] VERIO test strip USE AS DIRECTED TO TEST BLOOD GLUCOSE 3 TIMES DAILY. 12/30/18   Fayrene Helper, MD  polyethylene glycol powder (GLYCOLAX/MIRALAX) powder MIX 17G (1 CAPFUL) IN 8 OUNCES OF JUICE OR WATER AND DRINK ONCE DAILY. 12/16/18   Fayrene Helper, MD  predniSONE (DELTASONE) 5 MG tablet TAKE 1 TABLET BY MOUTH ONCE DAILY. 02/16/19   Carole Civil, MD  PROAIR HFA 108 [phone] Base) MCG/ACT inhaler INHALE 2 PUFFS BY MOUTH EVERY 6 HOURS AS NEEDED . Patient taking differently: Inhale 2 puffs into the lungs every 6 (six) hours as needed for wheezing or shortness of breath.  07/21/18   Fayrene Helper, MD  pyridostigmine (MESTINON) 60 MG tablet TAKE (1) TABLET BY MOUTH THREE TIMES A DAY. 02/12/19   Fayrene Helper, MD  solifenacin (VESICARE) 10 MG tablet TAKE 1 TABLET BY MOUTH ONCE A DAY. 02/12/19   Fayrene Helper, MD  UNABLE TO FIND Diabetic shoes x 1 Inserts x 3 DX E11.9 07/21/18   Fayrene Helper, MD    Allergies as of 01/08/2019  . (No Known Allergies)    Family History  Problem Relation Age of Onset  . Cancer Mother   . Heart disease Mother   . Cancer Father   . Heart disease Father   . Diabetes Sister   . Hypertension Brother   . Heart disease Brother   . Cancer Sister   . Kidney failure Sister   . Diabetes Son   . Hypertension Son   . Hypertension Daughter   . Colon cancer Neg Hx     Social History   Socioeconomic History  . Marital status: Single    Spouse name: Not on file  . Number of children: 2  . Years of education: Not on file  . Highest education level: Not on file  Occupational History  . Occupation: disabled    Employer: RETIRED  Social Needs  . Financial resource strain: Not very hard  . Food insecurity:    Worry: Never true    Inability: Never true  . Transportation needs:    Medical: Yes    Non-medical: No  Tobacco Use  .  Smoking status: Former Smoker    Packs/day: 0.50    Years: 25.00    Pack years: 12.50    Types: Cigarettes    Last attempt to quit: 01/01/2004    Years since quitting: 15.1  . Smokeless tobacco: Never Used  Substance and Sexual Activity  . Alcohol use: No  . Drug use: No  . Sexual activity: Never    Birth control/protection: Surgical  Lifestyle  . Physical activity:    Days per week: 0 days    Minutes per session: 0 min  . Stress: Only a little  Relationships  . Social connections:    Talks on phone: Not on file    Gets together: Not on file    Attends religious service: Not on file    Active member of club or organization: Not on file    Attends meetings of clubs or organizations: Not on file    Relationship status: Not on file  . Intimate partner violence:    Fear of current or ex partner: Not on file    Emotionally abused: Not on file    Physically abused: Not on file    Forced sexual activity: Not on file  Other Topics Concern  . Not on file  Social History Narrative   Patient lives at home by herself   Patient drinks one cup of coffee a day   Patient is right handed.    Review of Systems: See HPI, otherwise negative ROS   Physical Exam: There were no vitals taken for this visit. General:   Alert,  pleasant and cooperative in NAD Head:  Normocephalic and atraumatic. Neck:  Supple; Lungs:  Clear throughout to auscultation.    Heart:  Regular rate and rhythm. Abdomen:  Soft, nontender and nondistended. Normal bowel sounds, without guarding, and without rebound.   Neurologic:  Alert and  oriented x4;  grossly normal neurologically.  Impression/Plan:     Anemia/BRBPR  PLAN:  1. TCS TODAY. DISCUSSED PROCEDURE, BENEFITS, & RISKS: < 1% chance of medication reaction, bleeding, perforation, or rupture of spleen/liver.

## 2019-02-26 NOTE — Discharge Instructions (Signed)
NO SOURCE FOR YOUR LOW BLOOD COUNT/IRON DEFICIT WAS IDENTIFIED. YOUR RECTAL BLEEDING IS DUE TO internal hemorrhoids. YOU HAVE diverticulosis IN YOUR LEFT AND RIGHT COLON. YOU HAD TWO POLYPS REMOVED. THE LAST PART OF YOUR SMALL BOWEL IS NORMAL.   DRINK WATER TO KEEP YOUR URINE LIGHT YELLOW.  CONTINUE YOUR WEIGHT LOSS EFFORTS. YOUR BODY MASS INDEX IS OVER 40 WHICH MEANS YOU ARE MORBIDLY OBESE. OBESITY IS ASSOCIATED WITH AN INCREASE FOR ALL CANCERS, INCLUDING ESOPHAGEAL AND COLON CANCER.   FOLLOW A HIGH FIBER DIET. AVOID ITEMS THAT CAUSE BLOATING. See info below.  USE PREPARATION H FOUR TIMES  A DAY IF NEEDED TO RELIEVE RECTAL PAIN/PRESSURE/BLEEDING.   YOUR BIOPSY RESULTS WILL BE BACK IN 5 BUSINESS DAYS.  FOLLOW UP IN 4 MOS WITH DR. Gala Romney TO DISCUSS THE NEED FOR ADDITIONAL EVALUATION.   We do not routinely screen for polyps after the age of 18.   Colonoscopy Care After Read the instructions outlined below and refer to this sheet in the next week. These discharge instructions provide you with general information on caring for yourself after you leave the hospital. While your treatment has been planned according to the most current medical practices available, unavoidable complications occasionally occur. If you have any problems or questions after discharge, call DR. Darien Kading, [phone].  ACTIVITY  You may resume your regular activity, but move at a slower pace for the next 24 hours.   Take frequent rest periods for the next 24 hours.   Walking will help get rid of the air and reduce the bloated feeling in your belly (abdomen).   No driving for 24 hours (because of the medicine (anesthesia) used during the test).   You may shower.   Do not sign any important legal documents or operate any machinery for 24 hours (because of the anesthesia used during the test).    NUTRITION  Drink plenty of fluids.   You may resume your normal diet as instructed by your doctor.   Begin with a  light meal and progress to your normal diet. Heavy or fried foods are harder to digest and may make you feel sick to your stomach (nauseated).   Avoid alcoholic beverages for 24 hours or as instructed.    MEDICATIONS  You may resume your normal medications.   WHAT YOU CAN EXPECT TODAY  Some feelings of bloating in the abdomen.   Passage of more gas than usual.   Spotting of blood in your stool or on the toilet paper  .  IF YOU HAD POLYPS REMOVED DURING THE COLONOSCOPY:  Eat a soft diet IF YOU HAVE NAUSEA, BLOATING, ABDOMINAL PAIN, OR VOMITING.    FINDING OUT THE RESULTS OF YOUR TEST Not all test results are available during your visit. DR. Oneida Alar WILL CALL YOU WITHIN 14 DAYS OF YOUR PROCEDUE WITH YOUR RESULTS. Do not assume everything is normal if you have not heard from DR. Taniah Reinecke, CALL HER OFFICE AT [phone].  SEEK IMMEDIATE MEDICAL ATTENTION AND CALL THE OFFICE: [phone] IF:  You have more than a spotting of blood in your stool.   Your belly is swollen (abdominal distention).   You are nauseated or vomiting.   You have a temperature over 101F.   You have abdominal pain or discomfort that is severe or gets worse throughout the day.  High-Fiber Diet A high-fiber diet changes your normal diet to include more whole grains, legumes, fruits, and vegetables. Changes in the diet involve replacing refined carbohydrates with unrefined foods.  The calorie level of the diet is essentially unchanged. The Dietary Reference Intake (recommended amount) for adult males is 38 grams per day. For adult females, it is 25 grams per day. Pregnant and lactating women should consume 28 grams of fiber per day. Fiber is the intact part of a plant that is not broken down during digestion. Functional fiber is fiber that has been isolated from the plant to provide a beneficial effect in the body.  PURPOSE  Increase stool bulk.   Ease and regulate bowel movements.   Lower cholesterol.    REDUCE RISK OF COLON CANCER  INDICATIONS THAT YOU NEED MORE FIBER  Constipation and hemorrhoids.   Uncomplicated diverticulosis (intestine condition) and irritable bowel syndrome.   Weight management.   As a protective measure against hardening of the arteries (atherosclerosis), diabetes, and cancer.   GUIDELINES FOR INCREASING FIBER IN THE DIET  Start adding fiber to the diet slowly. A gradual increase of about 5 more grams (2 slices of whole-wheat bread, 2 servings of most fruits or vegetables, or 1 bowl of high-fiber cereal) per day is best. Too rapid an increase in fiber may result in constipation, flatulence, and bloating.   Drink enough water and fluids to keep your urine clear or pale yellow. Water, juice, or caffeine-free drinks are recommended. Not drinking enough fluid may cause constipation.   Eat a variety of high-fiber foods rather than one type of fiber.   Try to increase your intake of fiber through using high-fiber foods rather than fiber pills or supplements that contain small amounts of fiber.   The goal is to change the types of food eaten. Do not supplement your present diet with high-fiber foods, but replace foods in your present diet.   INCLUDE A VARIETY OF FIBER SOURCES  Replace refined and processed grains with whole grains, canned fruits with fresh fruits, and incorporate other fiber sources. White rice, white breads, and most bakery goods contain little or no fiber.   Brown whole-grain rice, buckwheat oats, and many fruits and vegetables are all good sources of fiber. These include: broccoli, Brussels sprouts, cabbage, cauliflower, beets, sweet potatoes, white potatoes (skin on), carrots, tomatoes, eggplant, squash, berries, fresh fruits, and dried fruits.   Cereals appear to be the richest source of fiber. Cereal fiber is found in whole grains and bran. Bran is the fiber-rich outer coat of cereal grain, which is largely removed in refining. In whole-grain  cereals, the bran remains. In breakfast cereals, the largest amount of fiber is found in those with "bran" in their names. The fiber content is sometimes indicated on the label.   You may need to include additional fruits and vegetables each day.   In baking, for 1 cup white flour, you may use the following substitutions:   1 cup whole-wheat flour minus 2 tablespoons.   1/2 cup white flour plus 1/2 cup whole-wheat flour.   Polyps, Colon  A polyp is extra tissue that grows inside your body. Colon polyps grow in the large intestine. The large intestine, also called the colon, is part of your digestive system. It is a long, hollow tube at the end of your digestive tract where your body makes and stores stool. Most polyps are not dangerous. They are benign. This means they are not cancerous. But over time, some types of polyps can turn into cancer. Polyps that are smaller than a pea are usually not harmful. But larger polyps could someday become or may already be cancerous. To  be safe, doctors remove all polyps and test them.   PREVENTION There is not one sure way to prevent polyps. You might be able to lower your risk of getting them if you:  Eat more fruits and vegetables and less fatty food.   Do not smoke.   Avoid alcohol.   Exercise every day.   Lose weight if you are overweight.   Eating more calcium and folate can also lower your risk of getting polyps. Some foods that are rich in calcium are milk, cheese, and broccoli. Some foods that are rich in folate are chickpeas, kidney beans, and spinach.    Diverticulosis Diverticulosis is a common condition that develops when small pouches (diverticula) form in the wall of the colon. The risk of diverticulosis increases with age. It happens more often in people who eat a low-fiber diet. Most individuals with diverticulosis have no symptoms. Those individuals with symptoms usually experience belly (abdominal) pain, constipation, or loose  stools (diarrhea).  HOME CARE INSTRUCTIONS  Increase the amount of fiber in your diet as directed by your caregiver or dietician. This may reduce symptoms of diverticulosis.   Drink at least 6 to 8 glasses of water each day to prevent constipation.   Try not to strain when you have a bowel movement.   Avoiding nuts and seeds to prevent complications is NOT NECESSARY.   FOODS HAVING HIGH FIBER CONTENT INCLUDE:  Fruits. Apple, peach, pear, tangerine, raisins, prunes.   Vegetables. Brussels sprouts, asparagus, broccoli, cabbage, carrot, cauliflower, romaine lettuce, spinach, summer squash, tomato, winter squash, zucchini.   Starchy Vegetables. Baked beans, kidney beans, lima beans, split peas, lentils, potatoes (with skin).   Grains. Whole wheat bread, brown rice, bran flake cereal, plain oatmeal, white rice, shredded wheat, bran muffins.

## 2019-02-26 NOTE — Transfer of Care (Signed)
Immediate Anesthesia Transfer of Care Note  Patient: [name]  Procedure(s) Performed: COLONOSCOPY WITH PROPOFOL (N/A ) POLYPECTOMY  Patient Location: PACU  Anesthesia Type:MAC  Level of Consciousness: awake, alert , oriented and patient cooperative  Airway & Oxygen Therapy: Patient Spontanous Breathing  Post-op Assessment: Report given to RN and Post -op Vital signs reviewed and stable  Post vital signs: Reviewed and stable  Last Vitals:  Vitals Value Taken Time  BP 151/51 02/26/2019 10:21 AM  Temp    Pulse 67 02/26/2019 10:23 AM  Resp 15 02/26/2019 10:23 AM  SpO2 90 % 02/26/2019 10:23 AM  Vitals shown include unvalidated device data.  Last Pain:  Vitals:   02/26/19 1016  PainSc: 0-No pain         Complications: No apparent anesthesia complications

## 2019-02-26 NOTE — Anesthesia Postprocedure Evaluation (Signed)
Anesthesia Post Note  Patient: TYSON MASIN  Procedure(s) Performed: COLONOSCOPY WITH PROPOFOL (N/A ) POLYPECTOMY  Patient location during evaluation: PACU Anesthesia Type: MAC Level of consciousness: awake and alert and oriented Pain management: pain level controlled Vital Signs Assessment: post-procedure vital signs reviewed and stable Respiratory status: spontaneous breathing Cardiovascular status: stable Postop Assessment: no apparent nausea or vomiting Anesthetic complications: no     Last Vitals: There were no vitals filed for this visit.  Last Pain:  Vitals:   02/26/19 1016  PainSc: 0-No pain                 ADAMS, AMY A

## 2019-02-26 NOTE — Op Note (Signed)
[facility] Patient Name: [name] Procedure Date: 02/26/2019 9:16 AM MRN: [id number] Date of Birth: May 14, 1941 Attending MD: Barney Drain MD, MD CSN: [id number] Age: 78 Admit Type: Outpatient Procedure:                Colonoscopy WITH COLD SNARE POLYPECTOMY Indications:              Hematochezia, Anemia Providers:                Barney Drain MD, MD, Rosina Lowenstein, RN, Aram Candela Referring MD:             Norwood Levo. Simpson MD, MD Medicines:                Propofol per Anesthesia Complications:            No immediate complications. Estimated Blood Loss:     Estimated blood loss was minimal. Procedure:                Pre-Anesthesia Assessment:                           - Prior to the procedure, a History and Physical                            was performed, and patient medications and                            allergies were reviewed. The patient's tolerance of                            previous anesthesia was also reviewed. The risks                            and benefits of the procedure and the sedation                            options and risks were discussed with the patient.                            All questions were answered, and informed consent                            was obtained. Prior Anticoagulants: The patient has                            taken no previous anticoagulant or antiplatelet                            agents. ASA Grade Assessment: II - A patient with                            mild systemic disease. After reviewing the risks                            and benefits, the patient was deemed in  satisfactory condition to undergo the procedure.                            After obtaining informed consent, the colonoscope                            was passed under direct vision. Throughout the                            procedure, the patient's blood pressure, pulse, and                            oxygen  saturations were monitored continuously. The                            [id number]-[id number] ([id number]) scope was introduced through                            the anus and advanced to the 5 cm into the ileum.                            The colonoscopy was somewhat difficult due to a                            tortuous colon. Successful completion of the                            procedure was aided by straightening and shortening                            the scope to obtain bowel loop reduction and                            COLOWRAP. The patient tolerated the procedure well.                            The quality of the bowel preparation was good. The                            terminal ileum, ileocecal valve, appendiceal                            orifice, and rectum were photographed. Scope In: 9:55:49 AM Scope Out: 10:14:46 AM Scope Withdrawal Time: 0 hours 16 minutes 20 seconds  Total Procedure Duration: 0 hours 18 minutes 57 seconds  Findings:      The terminal ileum appeared normal.      Two sessile polyps were found in the proximal ascending colon. The       polyps were 2 to 5 mm in size. These polyps were removed with a cold       snare. Resection and retrieval were complete.      Multiple small and large-mouthed diverticula were found in the       recto-sigmoid colon, sigmoid colon, descending colon and ascending colon.  External and internal hemorrhoids were found. Impression:               - The examined portion of the ileum was normal.                           - Two 2 to 5 mm polyps in the proximal ascending                            colon, removed with a cold snare. Resected and                            retrieved.                           - Diverticulosis in the recto-sigmoid colon, in the                            sigmoid colon, in the descending colon and in the                            ascending colon.                           - External and internal  hemorrhoids. Moderate Sedation:      Per Anesthesia Care Recommendation:           - Patient has a contact number available for                            emergencies. The signs and symptoms of potential                            delayed complications were discussed with the                            patient. Return to normal activities tomorrow.                            Written discharge instructions were provided to the                            patient.                           - High fiber diet.                           - Continue present medications.                           - Await pathology results.                           - Repeat colonoscopy is not recommended due to  current age (56 years or older) for surveillance.                           - Return to GI office in 4 months. Procedure Code(s):        --- Professional ---                           [phone], Colonoscopy, flexible; with removal of                            tumor(s), polyp(s), or other lesion(s) by snare                            technique Diagnosis Code(s):        --- Professional ---                           K64.8, Other hemorrhoids                           D12.2, Benign neoplasm of ascending colon                           K92.1, Melena (includes Hematochezia)                           D64.9, Anemia, unspecified                           K57.30, Diverticulosis of large intestine without                            perforation or abscess without bleeding CPT copyright 2018 [employer]. All rights reserved. The codes documented in this report are preliminary and upon coder review may  be revised to meet current compliance requirements. Barney Drain, MD Barney Drain MD, MD 02/26/2019 10:31:25 AM This report has been signed electronically. Number of Addenda: 0

## 2019-02-26 NOTE — Anesthesia Preprocedure Evaluation (Signed)
Anesthesia Evaluation  Patient identified by MRN, date of birth, ID band Patient awake    Reviewed: Allergy & Precautions, H&P , NPO status , Patient's Chart, lab work & pertinent test results, reviewed documented beta blocker date and time   Airway Mallampati: II  TM Distance: >3 FB Neck ROM: full    Dental  (+) Edentulous Upper, Missing   Pulmonary sleep apnea , pneumonia, COPD, former smoker,    Pulmonary exam normal breath sounds clear to auscultation       Cardiovascular Exercise Tolerance: Good hypertension, + Valvular Problems/Murmurs  Rhythm:regular Rate:Normal     Neuro/Psych  Headaches, PSYCHIATRIC DISORDERS Anxiety Depression  Neuromuscular disease    GI/Hepatic Neg liver ROS, PUD, GERD  ,  Endo/Other  diabetesHypothyroidism Morbid obesity  Renal/GU Renal disease  negative genitourinary   Musculoskeletal   Abdominal   Peds  Hematology  (+) Blood dyscrasia, anemia ,   Anesthesia Other Findings   Reproductive/Obstetrics negative OB ROS                             Anesthesia Physical Anesthesia Plan  ASA: III  Anesthesia Plan: MAC   Post-op Pain Management:    Induction:   PONV Risk Score and Plan:   Airway Management Planned:   Additional Equipment:   Intra-op Plan:   Post-operative Plan:   Informed Consent: I have reviewed the patients History and Physical, chart, labs and discussed the procedure including the risks, benefits and alternatives for the proposed anesthesia with the patient or authorized representative who has indicated his/her understanding and acceptance.     Dental Advisory Given  Plan Discussed with: CRNA  Anesthesia Plan Comments:         Anesthesia Quick Evaluation

## 2019-02-26 NOTE — Anesthesia Procedure Notes (Signed)
Procedure Name: [place] Performed by: Andree Elk Amy A, CRNA Pre-anesthesia Checklist: Patient identified, Emergency Drugs available, Suction available, Timeout performed and Patient being monitored Patient Re-evaluated:Patient Re-evaluated prior to induction Oxygen Delivery Method: Non-rebreather mask

## 2019-02-26 NOTE — Addendum Note (Signed)
Addendum  created 02/26/19 1027 by Mickel Baas, CRNA   Intraprocedure Meds edited

## 2019-02-27 ENCOUNTER — Telehealth: Payer: Self-pay | Admitting: Gastroenterology

## 2019-02-27 NOTE — Telephone Encounter (Signed)
Please call pt. She had TWO simple adenomas removed. YOUR RECTAL BLEEDING IS DUE TO internal hemorrhoids.    DRINK WATER TO KEEP YOUR URINE LIGHT YELLOW. CONTINUE YOUR WEIGHT LOSS EFFORTS.   FOLLOW A HIGH FIBER DIET. AVOID ITEMS THAT CAUSE BLOATING.  USE PREPARATION H FOUR TIMES  A DAY IF NEEDED TO RELIEVE RECTAL PAIN/PRESSURE/BLEEDING.  FOLLOW UP IN 4 MOS WITH DR. Gala Romney TO DISCUSS THE NEED FOR ADDITIONAL EVALUATION.   We do not routinely screen for polyps after the age of 47.

## 2019-03-02 NOTE — Telephone Encounter (Signed)
Lmom, waiting on a return call.  

## 2019-03-03 ENCOUNTER — Inpatient Hospital Stay (HOSPITAL_COMMUNITY): Payer: Medicare Other | Attending: Hematology

## 2019-03-03 ENCOUNTER — Other Ambulatory Visit: Payer: Self-pay

## 2019-03-03 ENCOUNTER — Encounter (HOSPITAL_COMMUNITY): Payer: Self-pay | Admitting: Gastroenterology

## 2019-03-03 DIAGNOSIS — Z87891 Personal history of nicotine dependence: Secondary | ICD-10-CM | POA: Diagnosis not present

## 2019-03-03 DIAGNOSIS — D472 Monoclonal gammopathy: Secondary | ICD-10-CM | POA: Diagnosis not present

## 2019-03-03 DIAGNOSIS — D509 Iron deficiency anemia, unspecified: Secondary | ICD-10-CM | POA: Insufficient documentation

## 2019-03-03 DIAGNOSIS — D508 Other iron deficiency anemias: Secondary | ICD-10-CM

## 2019-03-03 LAB — CBC WITH DIFFERENTIAL/PLATELET
ABS IMMATURE GRANULOCYTES: 0.04 10*3/uL (ref 0.00–0.07)
Basophils Absolute: 0 10*3/uL (ref 0.0–0.1)
Basophils Relative: 0 %
Eosinophils Absolute: 0.4 10*3/uL (ref 0.0–0.5)
Eosinophils Relative: 5 %
HCT: 36.3 % (ref 36.0–46.0)
Hemoglobin: 10.7 g/dL — ABNORMAL LOW (ref 12.0–15.0)
IMMATURE GRANULOCYTES: 1 %
Lymphocytes Relative: 18 %
Lymphs Abs: 1.5 10*3/uL (ref 0.7–4.0)
MCH: 27.1 pg (ref 26.0–34.0)
MCHC: 29.5 g/dL — ABNORMAL LOW (ref 30.0–36.0)
MCV: 91.9 fL (ref 80.0–100.0)
MONOS PCT: 9 %
Monocytes Absolute: 0.7 10*3/uL (ref 0.1–1.0)
Neutro Abs: 5.6 10*3/uL (ref 1.7–7.7)
Neutrophils Relative %: 67 %
Platelets: 280 10*3/uL (ref 150–400)
RBC: 3.95 MIL/uL (ref 3.87–5.11)
RDW: 16.8 % — ABNORMAL HIGH (ref 11.5–15.5)
WBC: 8.3 10*3/uL (ref 4.0–10.5)
nRBC: 0 % (ref 0.0–0.2)

## 2019-03-03 LAB — FERRITIN: Ferritin: 207 ng/mL (ref 11–307)

## 2019-03-03 LAB — COMPREHENSIVE METABOLIC PANEL
ALT: 17 U/L (ref 0–44)
AST: 20 U/L (ref 15–41)
Albumin: 3.7 g/dL (ref 3.5–5.0)
Alkaline Phosphatase: 135 U/L — ABNORMAL HIGH (ref 38–126)
Anion gap: 7 (ref 5–15)
BUN: 15 mg/dL (ref 8–23)
CO2: 28 mmol/L (ref 22–32)
Calcium: 9.2 mg/dL (ref 8.9–10.3)
Chloride: 106 mmol/L (ref 98–111)
Creatinine, Ser: 0.94 mg/dL (ref 0.44–1.00)
GFR calc non Af Amer: 59 mL/min — ABNORMAL LOW (ref >60)
Glucose, Bld: 102 mg/dL — ABNORMAL HIGH (ref 70–99)
Potassium: 4 mmol/L (ref 3.5–5.1)
Sodium: 141 mmol/L (ref 135–145)
Total Bilirubin: 0.5 mg/dL (ref 0.3–1.2)
Total Protein: 7.3 g/dL (ref 6.5–8.1)

## 2019-03-03 LAB — LACTATE DEHYDROGENASE: LDH: 228 U/L — ABNORMAL HIGH (ref 98–192)

## 2019-03-03 NOTE — Telephone Encounter (Signed)
Pt notified of results. Pt would like to cancel her 04/2019 apt with AB and push it out to 4 months with RMR, per SF.

## 2019-03-03 NOTE — Telephone Encounter (Signed)
CANCELLED UPCOMING APPOINTMENT WITH ANNA AND PUT ON RECALL FOR OV WITH RMR

## 2019-03-04 ENCOUNTER — Encounter: Payer: Self-pay | Admitting: Family Medicine

## 2019-03-04 ENCOUNTER — Ambulatory Visit (INDEPENDENT_AMBULATORY_CARE_PROVIDER_SITE_OTHER): Payer: Medicare Other | Admitting: Family Medicine

## 2019-03-04 VITALS — BP 160/70 | Resp 15 | Ht 66.0 in | Wt 253.0 lb

## 2019-03-04 DIAGNOSIS — IMO0001 Reserved for inherently not codable concepts without codable children: Secondary | ICD-10-CM

## 2019-03-04 DIAGNOSIS — E032 Hypothyroidism due to medicaments and other exogenous substances: Secondary | ICD-10-CM

## 2019-03-04 DIAGNOSIS — G8929 Other chronic pain: Secondary | ICD-10-CM

## 2019-03-04 DIAGNOSIS — Z1231 Encounter for screening mammogram for malignant neoplasm of breast: Secondary | ICD-10-CM | POA: Diagnosis not present

## 2019-03-04 DIAGNOSIS — I10 Essential (primary) hypertension: Secondary | ICD-10-CM | POA: Diagnosis not present

## 2019-03-04 DIAGNOSIS — Z794 Long term (current) use of insulin: Secondary | ICD-10-CM

## 2019-03-04 DIAGNOSIS — E785 Hyperlipidemia, unspecified: Secondary | ICD-10-CM

## 2019-03-04 DIAGNOSIS — E119 Type 2 diabetes mellitus without complications: Secondary | ICD-10-CM | POA: Diagnosis not present

## 2019-03-04 MED ORDER — HYDRALAZINE HCL 50 MG PO TABS: 50.0000 mg | ORAL_TABLET | Freq: Three times a day (TID) | ORAL | 5 refills | Status: DC

## 2019-03-04 MED ORDER — GABAPENTIN 300 MG PO CAPS: 300.0000 mg | ORAL_CAPSULE | Freq: Two times a day (BID) | ORAL | 5 refills | Status: DC

## 2019-03-04 NOTE — Patient Instructions (Addendum)
F/U in 5  weeks to re eval blood pressure, call if you need me before  Pain  management f/u is last week in June  Please schedule mammogram  At checkout  Blood pressure stays high, you need to increase the hydralazine to 50 mg three times daily  OK to take 25 mg TWO tablets three times daily till done  Please get fasting lipid, HBa1C and TSH one week before appointment early April  Please send urine for screening (toxicology)  Thanks for choosing [facility], we consider it a privelige to serve you.

## 2019-03-05 LAB — PROTEIN ELECTROPHORESIS, SERUM
A/G Ratio: 1 (ref 0.7–1.7)
ALPHA-2-GLOBULIN: 0.8 g/dL (ref 0.4–1.0)
Albumin ELP: 3.5 g/dL (ref 2.9–4.4)
Alpha-1-Globulin: 0.3 g/dL (ref 0.0–0.4)
BETA GLOBULIN: 1.2 g/dL (ref 0.7–1.3)
Gamma Globulin: 1.3 g/dL (ref 0.4–1.8)
Globulin, Total: 3.5 g/dL (ref 2.2–3.9)
Total Protein ELP: 7 g/dL (ref 6.0–8.5)

## 2019-03-07 ENCOUNTER — Encounter: Payer: Self-pay | Admitting: Family Medicine

## 2019-03-07 MED ORDER — HYDROCODONE-ACETAMINOPHEN 10-325 MG PO TABS: ORAL_TABLET | ORAL | 0 refills | Status: DC

## 2019-03-07 MED ORDER — HYDROCODONE-ACETAMINOPHEN 10-325 MG PO TABS: ORAL_TABLET | ORAL | 0 refills | Status: AC

## 2019-03-07 NOTE — Assessment & Plan Note (Signed)
Unchanged  Patient re-educated about  the importance of commitment to a  minimum of 150 minutes of exercise per week as able.  The importance of healthy food choices with portion control discussed, as well as eating regularly and within a 12 hour window most days. The need to choose "clean , green" food 50 to 75% of the time is discussed, as well as to make water the primary drink and set a goal of 64 ounces water daily.  Encouraged to start a food diary,  and to consider  joining a support group. Sample diet sheets offered. Goals set by the patient for the next several months.   Weight /BMI 03/04/2019 02/19/2019 01/08/2019  WEIGHT 253 lb 254 lb 6.4 oz 262 lb  HEIGHT 5\' 6"  5\' 6"  5\' 6"   BMI 40.84 kg/m2 41.06 kg/m2 42.29 kg/m2

## 2019-03-07 NOTE — Assessment & Plan Note (Signed)
[name] is reminded of the importance of commitment to daily physical activity for 30 minutes or more, as able and the need to limit carbohydrate intake to 30 to 60 grams per meal to help with blood sugar control.   The need to take medication as prescribed, test blood sugar as directed, and to call between visits if there is a concern that blood sugar is uncontrolled is also discussed.   [name] is reminded of the importance of daily foot exam, annual eye examination, and good blood sugar, blood pressure and cholesterol control.  Diabetic Labs Latest Ref Rng & Units 03/03/2019 01/07/2019 12/10/2018 11/03/2018 10/16/2018  HbA1c <5.7 % of total Hgb - - - - 6.1(H)  Microalbumin mg/dL - - - - -  Micro/Creat Ratio <30 mcg/mg creat - - - - -  Chol <200 mg/dL - - - - 215(H)  HDL >50 mg/dL - - - - 51  Calc LDL mg/dL (calc) - - - - 145(H)  Triglycerides <150 mg/dL - - - - 89  Creatinine 0.44 - 1.00 mg/dL 0.94 0.87 1.11(H) 0.94 1.22(H)   BP/Weight 03/04/2019 02/26/2019 02/19/2019 01/26/2019 01/19/2019 06/14/7091 09/04/7472  Systolic BP 403 709 643 838 184 037 543  Diastolic BP 70 49 83 59 51 48 70  Wt. (Lbs) 253 - 254.4 - - - 262  BMI 40.84 - 41.06 - - - 42.29   Foot/eye exam completion dates Latest Ref Rng & Units 02/24/2019 07/17/2018  Eye Exam No Retinopathy Retinopathy(A) -  Foot Form Completion - - Done  Updated lab needed at/ before next visit. Controlled when last checked

## 2019-03-07 NOTE — Progress Notes (Signed)
[name]     MRN: [id number]      DOB: May 04, 1941   HPI [name] is here for follow up and re-evaluation of chronic medical conditions, medication management in particular chronic pain management  and review of any available recent lab and radiology data.  Preventive health is updated, specifically  Cancer screening and Immunization.   Questions or concerns regarding consultations or procedures which the PT has had in the interim are  addressed. The PT denies any adverse reactions to current medications since the last visit.  There are no new concerns.  There are no specific complaints  Denies polyuria, polydipsia, blurred vision , or hypoglycemic episodes.   ROS Denies recent fever or chills. Denies sinus pressure, nasal congestion, ear pain or sore throat. Denies chest congestion, productive cough or wheezing. Denies chest pains, palpitations and leg swelling Denies abdominal pain, nausea, vomiting,diarrhea or constipation.   Denies dysuria, frequency, hesitancy or incontinence. C/o chronic joint pain, swelling and limitation in mobility. Denies headaches, seizures, . Denies uncontrolled  depression, anxiety or insomnia. Denies skin break down or rash.   PE  BP (!) 160/70   Resp 15   Ht 5\' 6"  (1.676 m)   Wt 253 lb (114.8 kg)   SpO2 98%   BMI 40.84 kg/m   Patient alert and oriented and in no cardiopulmonary distress.  HEENT: No facial asymmetry, EOMI,   oropharynx pink and moist.  Neck supple no JVD, no mass.  Chest: Clear to auscultation bilaterally.  CVS: S1, S2 no murmurs, no S3.Regular rate.  ABD: Soft non tender.   Ext: No edema  MS: decreased  ROM spine, shoulders, hips and knees.  Skin: Intact, no ulcerations or rash noted.  Psych: Good eye contact, normal affect. Memory intact not anxious or depressed appearing.  CNS: CN 2-12 intact, power,  normal throughout.no focal deficits noted.   Assessment & Plan  Essential hypertension,  benign Uncontrolled, increase hydrallazin dose to 50 mg three times daily DASH diet and commitment to daily physical activity for a minimum of 30 minutes discussed and encouraged, as a part of hypertension management. The importance of attaining a healthy weight is also discussed.  BP/Weight 03/04/2019 02/26/2019 02/19/2019 01/26/2019 01/19/2019 7/65/4650 03/05/4655  Systolic BP 812 751 700 174 944 967 591  Diastolic BP 70 49 83 59 51 48 70  Wt. (Lbs) 253 - 254.4 - - - 262  BMI 40.84 - 41.06 - - - 42.29       Morbid obesity Unchanged  Patient re-educated about  the importance of commitment to a  minimum of 150 minutes of exercise per week as able.  The importance of healthy food choices with portion control discussed, as well as eating regularly and within a 12 hour window most days. The need to choose "clean , green" food 50 to 75% of the time is discussed, as well as to make water the primary drink and set a goal of 64 ounces water daily.  Encouraged to start a food diary,  and to consider  joining a support group. Sample diet sheets offered. Goals set by the patient for the next several months.   Weight /BMI 03/04/2019 02/19/2019 01/08/2019  WEIGHT 253 lb 254 lb 6.4 oz 262 lb  HEIGHT 5\' 6"  5\' 6"  5\' 6"   BMI 40.84 kg/m2 41.06 kg/m2 42.29 kg/m2      Encounter for chronic pain management The patient's Controlled Substance registry is reviewed and compliance confirmed. Adequacy of  Pain  control and level of function is assessed. Medication dosing is adjusted as deemed appropriate. Twelve weeks of medication is prescribed , patient signs for the script and is provided with a follow up appointment between 11 to 12 weeks .   Diabetes mellitus, insulin dependent (IDDM), controlled ([place]) [name] is reminded of the importance of commitment to daily physical activity for 30 minutes or more, as able and the need to limit carbohydrate intake to 30 to 60 grams per meal to help with blood sugar  control.   The need to take medication as prescribed, test blood sugar as directed, and to call between visits if there is a concern that blood sugar is uncontrolled is also discussed.   [name] is reminded of the importance of daily foot exam, annual eye examination, and good blood sugar, blood pressure and cholesterol control.  Diabetic Labs Latest Ref Rng & Units 03/03/2019 01/07/2019 12/10/2018 11/03/2018 10/16/2018  HbA1c <5.7 % of total Hgb - - - - 6.1(H)  Microalbumin mg/dL - - - - -  Micro/Creat Ratio <30 mcg/mg creat - - - - -  Chol <200 mg/dL - - - - 215(H)  HDL >50 mg/dL - - - - 51  Calc LDL mg/dL (calc) - - - - 145(H)  Triglycerides <150 mg/dL - - - - 89  Creatinine 0.44 - 1.00 mg/dL 0.94 0.87 1.11(H) 0.94 1.22(H)   BP/Weight 03/04/2019 02/26/2019 02/19/2019 01/26/2019 01/19/2019 04/09/3012 01/03/3887  Systolic BP 757 972 820 601 561 537 943  Diastolic BP 70 49 83 59 51 48 70  Wt. (Lbs) 253 - 254.4 - - - 262  BMI 40.84 - 41.06 - - - 42.29   Foot/eye exam completion dates Latest Ref Rng & Units 02/24/2019 07/17/2018  Eye Exam No Retinopathy Retinopathy(A) -  Foot Form Completion - - Done  Updated lab needed at/ before next visit. Controlled when last checked

## 2019-03-07 NOTE — Assessment & Plan Note (Signed)
The patient's Controlled Substance registry is reviewed and compliance confirmed. Adequacy of  Pain control and level of function is assessed. Medication dosing is adjusted as deemed appropriate. Twelve weeks of medication is prescribed , patient signs for the script and is provided with a follow up appointment between 11 to 12 weeks .  

## 2019-03-07 NOTE — Assessment & Plan Note (Signed)
Uncontrolled, increase hydrallazin dose to 50 mg three times daily DASH diet and commitment to daily physical activity for a minimum of 30 minutes discussed and encouraged, as a part of hypertension management. The importance of attaining a healthy weight is also discussed.  BP/Weight 03/04/2019 02/26/2019 02/19/2019 01/26/2019 01/19/2019 0/92/9574 07/02/4036  Systolic BP 096 438 381 840 375 436 067  Diastolic BP 70 49 83 59 51 48 70  Wt. (Lbs) 253 - 254.4 - - - 262  BMI 40.84 - 41.06 - - - 42.29

## 2019-03-09 ENCOUNTER — Ambulatory Visit: Payer: Medicare Other | Admitting: Gastroenterology

## 2019-03-10 ENCOUNTER — Encounter (HOSPITAL_COMMUNITY): Payer: Self-pay | Admitting: Hematology

## 2019-03-10 ENCOUNTER — Inpatient Hospital Stay (HOSPITAL_BASED_OUTPATIENT_CLINIC_OR_DEPARTMENT_OTHER): Payer: Medicare Other | Admitting: Hematology

## 2019-03-10 VITALS — BP 140/51 | HR 57 | Temp 98.4°F | Resp 20 | Wt 266.0 lb

## 2019-03-10 DIAGNOSIS — D508 Other iron deficiency anemias: Secondary | ICD-10-CM

## 2019-03-10 DIAGNOSIS — Z87891 Personal history of nicotine dependence: Secondary | ICD-10-CM | POA: Diagnosis not present

## 2019-03-10 DIAGNOSIS — D472 Monoclonal gammopathy: Secondary | ICD-10-CM | POA: Diagnosis not present

## 2019-03-10 DIAGNOSIS — D509 Iron deficiency anemia, unspecified: Secondary | ICD-10-CM | POA: Diagnosis not present

## 2019-03-10 NOTE — Progress Notes (Signed)
[place], [place] [address]   CLINIC:  Medical Oncology/Hematology  PCP:  Fayrene Helper, MD [address] [place] [address] [phone]   REASON FOR VISIT: Follow-up for MGUS AND iron deficiency anemia  CURRENT THERAPY: Observation AND intermittent iron infusions   INTERVAL HISTORY:  Ms. Obyrne 78 y.o. female returns for routine follow-up for MGUS and iron deficiency anemia. She is here today and doing well since her last visit. She reports she is fatigued everyday and is unable to do much around her house. She has problems with constipation and nausea at times. She denies any bleeding. Denies any vomiting or diarrhea. Denies any new pains. Had not noticed any recent bleeding such as epistaxis, hematuria or hematochezia. Denies recent chest pain on exertion, shortness of breath on minimal exertion, pre-syncopal episodes, or palpitations. Denies any numbness or tingling in hands or feet. Denies any recent fevers, infections, or recent hospitalizations. Patient reports appetite at 0% and energy level at 0%. She has not lost any weight at this time.     REVIEW OF SYSTEMS:  Review of Systems  Constitutional: Positive for fatigue.  Gastrointestinal: Positive for constipation and nausea.  All other systems reviewed and are negative.    PAST MEDICAL/SURGICAL HISTORY:  Past Medical History:  Diagnosis Date  . Anemia in chronic renal disease 12/30/2015  . Anxiety   . Arthritis    "all over" (01/19/2014)  . Arthritis, lumbar spine   . Carpal tunnel syndrome   . Chronic bronchitis ([place])    "got it q yr for awhile; hasn't had it in awhile" (01/19/2014)  . Chronic kidney disease (CKD) stage G3b/A1, moderately decreased glomerular filtration rate (GFR) between 30-44 mL/min/1.73 square meter and albuminuria creatinine ratio less than 30 mg/g (HCC) 09/14/2009   Qualifier: Diagnosis of  By: Moshe Cipro MD, Joycelyn Schmid    . Chronic renal  disease, stage 3, moderately decreased glomerular filtration rate (GFR) between 30-59 mL/min/1.73 square meter (HCC) 09/14/2009   Qualifier: Diagnosis of  By: Moshe Cipro MD, Joycelyn Schmid    . Complication of anesthesia    combative  . COPD (chronic obstructive pulmonary disease) ([place])   . Depression   . Diverticulosis 07/2004   Colonscopy Dr Gala Romney  . Esophagitis, erosive 2009  . Exertional shortness of breath   . Gastroesophageal reflux   . QMGQQPYP(950.9)    "usually a couple times/wk" (01/19/2014)  . Heart murmur    saw cardiology In [place], he told her she did not need to come back.  . Hyperlipidemia   . Hypertension   . Impingement syndrome, shoulder   . Iron deficiency anemia   . Mitral regurgitation   . Myasthenia gravis   . Myasthenia gravis in remission ([place]) 11/24/2014  . Myasthenia gravis in remission ([place])   . Obesity   . OSA on CPAP    Negative on last sleep study  . Pneumonia 01/2012  . Pulmonary HTN ([place])   . Schatzki's ring    Last EGD w/ dilation 02/08/11, 2009 & 2007  . Seasonal allergies   . Shoulder pain   . Thyroid disease    "used to take RX; they took me off it" (01/19/2014)  . Tobacco abuse   . Type II diabetes mellitus ([place])    Past Surgical History:  Procedure Laterality Date  . [place] VITRECTOMY WITH 20 GAUGE MVR PORT Left 01/19/2014   Procedure: 25 GAUGE PARS PLANA VITRECTOMY WITH 20 GAUGE  MVR PORT; MEMBRAME PEEL; SERUM PATCH; LASER TREATMENT; C3F8;  Surgeon: Hayden Pedro, MD;  Location: [facility];  Service: Ophthalmology;  Laterality: Left;  . ABDOMINAL HYSTERECTOMY    . APPENDECTOMY    . CARPAL TUNNEL RELEASE Bilateral   . CATARACT EXTRACTION W/PHACO Left 10/21/2013   Procedure: LEFT CATARACT EXTRACTION PHACO AND INTRAOCULAR LENS PLACEMENT (IOC);  Surgeon: Marylynn Pearson, MD;  Location: [facility];  Service: Ophthalmology;  Laterality: Left;  . CHOLECYSTECTOMY    . COLONOSCOPY  05/08/2012   FIE:[id number] and external hemorrhoids; colonic  diverticulosis  . COLONOSCOPY WITH PROPOFOL N/A 02/26/2019   Procedure: COLONOSCOPY WITH PROPOFOL;  Surgeon: Danie Binder, MD;  [facility];  Service: Endoscopy;  Laterality: N/A;  9:00am  . ESOPHAGEAL DILATION     "more than 3 times" (01/19/2014)  . ESOPHAGOGASTRODUODENOSCOPY  02/08/11   [place]ourk-Distal esophageal erosion consistent with mild erosive reflux   esophagitis/ Noncritical Schatzki ring, small hiatal hernia otherwise upper/ gastrointestinal tract appeared unremarkable, status post passage  of a Maloney dilation to biopsy disruption of the ring described  . ESOPHAGOGASTRODUODENOSCOPY  04/2012   2 tandem incomplete distal esophagea rings s/p dilation.   . ESOPHAGOGASTRODUODENOSCOPY N/A 09/16/2014   Dr. Gala Romney: Schatzki ring status post dilation/disruption.  Hiatal hernia.  Marland Kitchen ESOPHAGOGASTRODUODENOSCOPY (EGD) WITH PROPOFOL N/A 12/08/2018   EGD with mild Schatzki ring s/p dilation, small hiatal hernia, otherwise normal  . EYE SURGERY     cataracts, bilateral  . INCONTINENCE SURGERY  08/26/09   [place]  . JOINT REPLACEMENT     right total knee  . MALONEY DILATION N/A 09/16/2014   Procedure: Venia Minks DILATION;  Surgeon: Daneil Dolin, MD;  [facility];  Service: Endoscopy;  Laterality: N/A;  . Venia Minks DILATION N/A 12/08/2018   Procedure: Venia Minks DILATION;  Surgeon: Daneil Dolin, MD;  [facility];  Service: Endoscopy;  Laterality: N/A;  . PARS PLANA VITRECTOMY W/ REPAIR OF MACULAR HOLE Left 01/19/2014  . POLYPECTOMY  02/26/2019   Procedure: POLYPECTOMY;  Surgeon: Danie Binder, MD;  [facility];  Service: Endoscopy;;  ascending colon polyps x2  . SAVORY DILATION N/A 09/16/2014   Procedure: SAVORY DILATION;  Surgeon: Daneil Dolin, MD;  [facility];  Service: Endoscopy;  Laterality: N/A;  . TONSILLECTOMY    . TOTAL KNEE ARTHROPLASTY Right 05/13/07   Dr. Aline Brochure  . TRIGGER FINGER RELEASE Right 06/26/2018   Procedure: RIGHT INDEX  FINGER TRIGGER FINGER/A-1 PULLEY RELEASE;  Surgeon: Carole Civil, MD;  [facility];  Service: Orthopedics;  Laterality: Right;     SOCIAL HISTORY:  Social History   Socioeconomic History  . Marital status: Single    Spouse name: Not on file  . Number of children: 2  . Years of education: Not on file  . Highest education level: Not on file  Occupational History  . Occupation: disabled    Employer: RETIRED  Social Needs  . Financial resource strain: Not very hard  . Food insecurity:    Worry: Never true    Inability: Never true  . Transportation needs:    Medical: Yes    Non-medical: No  Tobacco Use  . Smoking status: Former Smoker    Packs/day: 0.50    Years: 25.00    Pack years: 12.50    Types: Cigarettes    Last attempt to quit: 01/01/2004    Years since quitting: 15.1  . Smokeless tobacco: Never Used  Substance and  Sexual Activity  . Alcohol use: No  . Drug use: No  . Sexual activity: Never    Birth control/protection: Surgical  Lifestyle  . Physical activity:    Days per week: 0 days    Minutes per session: 0 min  . Stress: Only a little  Relationships  . Social connections:    Talks on phone: Not on file    Gets together: Not on file    Attends religious service: Not on file    Active member of club or organization: Not on file    Attends meetings of clubs or organizations: Not on file    Relationship status: Not on file  . Intimate partner violence:    Fear of current or ex partner: Not on file    Emotionally abused: Not on file    Physically abused: Not on file    Forced sexual activity: Not on file  Other Topics Concern  . Not on file  Social History Narrative   Patient lives at home by herself   Patient drinks one cup of coffee a day   Patient is right handed.    FAMILY HISTORY:  Family History  Problem Relation Age of Onset  . Cancer Mother   . Heart disease Mother   . Cancer Father   . Heart disease Father   . Diabetes Sister    . Hypertension Brother   . Heart disease Brother   . Cancer Sister   . Kidney failure Sister   . Diabetes Son   . Hypertension Son   . Hypertension Daughter   . Colon cancer Neg Hx     CURRENT MEDICATIONS:  Outpatient Encounter Medications as of 03/10/2019  Medication Sig  . acetaminophen (TYLENOL) 500 MG tablet Take 500 mg by mouth at bedtime.  Marland Kitchen amLODipine (NORVASC) 10 MG tablet TAKE 1 TABLET BY MOUTH ONCE A DAY.  Marland Kitchen B-D ULTRAFINE III SHORT PEN 31G X 8 MM MISC USE ONCE DAILY WITH LANTUS SOLOSTAR PEN.  . brimonidine-timolol (COMBIGAN) 0.2-0.5 % ophthalmic solution Place 1 drop into both eyes every 12 (twelve) hours.  . cetirizine (ZYRTEC) 10 MG tablet TAKE 1 TABLET BY MOUTH ONCE A DAY.  Marland Kitchen Cholecalciferol (VITAMIN D3) 2000 units TABS Take 2,000 Units by mouth at bedtime.  . citalopram (CELEXA) 20 MG tablet TAKE 1 TABLET BY MOUTH ONCE A DAY.  Marland Kitchen Cranberry 1000 MG CAPS Take 1,000 mg by mouth daily.  . cycloSPORINE (RESTASIS) 0.05 % ophthalmic emulsion Place 1 drop into both eyes 2 (two) times daily.   Marland Kitchen diltiazem (CARDIZEM CD) 240 MG 24 hr capsule TAKE (1) CAPSULE BY MOUTH TWICE DAILY.  Marland Kitchen docusate sodium (COLACE) 100 MG capsule Take 100 mg by mouth at bedtime.  Marland Kitchen ezetimibe (ZETIA) 10 MG tablet Take 1 tablet (10 mg total) by mouth daily.  . Flaxseed, Linseed, (FLAXSEED OIL) 1000 MG CAPS Take 1,000 mg by mouth daily.  . fluticasone (FLONASE) 50 MCG/ACT nasal spray PLACE 2 SPRAYS INTO BOTH NOSTRILS DAILY.  Marland Kitchen gabapentin (NEURONTIN) 300 MG capsule Take 1 capsule (300 mg total) by mouth 2 (two) times daily.  . hydrALAZINE (APRESOLINE) 50 MG tablet Take 1 tablet (50 mg total) by mouth 3 (three) times daily.  Derrill Memo ON 03/29/2019] HYDROcodone-acetaminophen (NORCO) 10-325 MG tablet Take one tablet by mouth two times daily for back and leg pain  . LANTUS SOLOSTAR 100 UNIT/ML Solostar Pen INJECT 30 UNITS SUBCUTANEOUSLY ONCE DAILY. (Patient taking differently: Inject 30 Units into the skin daily. )    .  linagliptin (TRADJENTA) 5 MG TABS tablet Take 1 tablet (5 mg total) by mouth daily.  Marland Kitchen losartan (COZAAR) 100 MG tablet TAKE (1) TABLET BY MOUTH DAILY.  . Multiple Vitamin (MULTIVITAMIN WITH MINERALS) TABS tablet Take 1 tablet by mouth at bedtime.  . ondansetron (ZOFRAN) 4 MG tablet TAKE (1) TABLET BY MOUTH EVERY FOUR HOURS AS NEEDED FOR NAUSEA. (Patient taking differently: Take 4 mg by mouth every 4 (four) hours as needed for nausea or vomiting. TAKE (1) TABLET BY MOUTH EVERY FOUR HOURS AS NEEDED FOR NAUSEA.)  . ONETOUCH DELICA LANCETS 94R MISC USE AS DIRECTED 3 TIMES DAILY FOR BLOOD GLUCOSE TESTING.  . pantoprazole (PROTONIX) 40 MG tablet Take 1 tablet (40 mg total) by mouth 2 (two) times daily before a meal.  . polyethylene glycol powder (GLYCOLAX/MIRALAX) powder MIX 17G (1 CAPFUL) IN 8 OUNCES OF JUICE OR WATER AND DRINK ONCE DAILY.  Marland Kitchen predniSONE (DELTASONE) 5 MG tablet TAKE 1 TABLET BY MOUTH ONCE DAILY.  Marland Kitchen PROAIR HFA 108 (90 Base) MCG/ACT inhaler INHALE 2 PUFFS BY MOUTH EVERY 6 HOURS AS NEEDED . (Patient taking differently: Inhale 2 puffs into the lungs every 6 (six) hours as needed for wheezing or shortness of breath. )  . pyridostigmine (MESTINON) 60 MG tablet TAKE (1) TABLET BY MOUTH THREE TIMES A DAY.  . rosuvastatin (CRESTOR) 40 MG tablet TAKE (1) TABLET BY MOUTH AT BEDTIME.  Marland Kitchen solifenacin (VESICARE) 10 MG tablet TAKE 1 TABLET BY MOUTH ONCE A DAY.  Marland Kitchen spironolactone (ALDACTONE) 25 MG tablet TAKE 1 TABLET BY MOUTH ONCE A DAY.  Marland Kitchen UNABLE TO FIND Diabetic shoes x 1 Inserts x 3 DX E11.9  . venlafaxine XR (EFFEXOR-XR) 150 MG 24 hr capsule Take 1 capsule (150 mg total) by mouth daily with breakfast.  . [DISCONTINUED] HYDROcodone-acetaminophen (NORCO) 10-325 MG tablet Take one tablet by mouth two times daily for uncontrolled back and leg pain  . [DISCONTINUED] HYDROcodone-acetaminophen (NORCO) 10-325 MG tablet Take one tablet by mouth two times daily for uncontrolled back and leg pain   No  facility-administered encounter medications on file as of 03/10/2019.     ALLERGIES:  No Known Allergies   PHYSICAL EXAM:  ECOG Performance status: 1  Vitals:   03/10/19 1109  BP: (!) 140/51  Pulse: (!) 57  Resp: 20  Temp: 98.4 F (36.9 C)  SpO2: 100%   Filed Weights   03/10/19 1109  Weight: 266 lb (120.7 kg)    Physical Exam Constitutional:      Appearance: Normal appearance. She is normal weight.  Cardiovascular:     Rate and Rhythm: Normal rate and regular rhythm.     Heart sounds: Normal heart sounds.  Pulmonary:     Effort: Pulmonary effort is normal.     Breath sounds: Normal breath sounds.  Musculoskeletal: Normal range of motion.  Skin:    General: Skin is warm and dry.  Neurological:     Mental Status: She is alert and oriented to person, place, and time. Mental status is at baseline.  Psychiatric:        Mood and Affect: Mood normal.        Behavior: Behavior normal.        Thought Content: Thought content normal.        Judgment: Judgment normal.      LABORATORY DATA:  I have reviewed the labs as listed.  CBC    Component Value Date/Time   WBC 8.3 03/03/2019 1244   RBC 3.95 03/03/2019  1244   HGB 10.7 (L) 03/03/2019 1244   HCT 36.3 03/03/2019 1244   PLT 280 03/03/2019 1244   MCV 91.9 03/03/2019 1244   MCH 27.1 03/03/2019 1244   MCHC 29.5 (L) 03/03/2019 1244   RDW 16.8 (H) 03/03/2019 1244   LYMPHSABS 1.5 03/03/2019 1244   MONOABS 0.7 03/03/2019 1244   EOSABS 0.4 03/03/2019 1244   BASOSABS 0.0 03/03/2019 1244   CMP Latest Ref Rng & Units 03/03/2019 01/07/2019 12/10/2018  Glucose 70 - 99 mg/dL 102(H) 169(H) 141(H)  BUN 8 - 23 mg/dL 15 14 19   Creatinine 0.44 - 1.00 mg/dL 0.94 0.87 1.11(H)  Sodium 135 - 145 mmol/L 141 139 141  Potassium 3.5 - 5.1 mmol/L 4.0 3.6 4.2  Chloride 98 - 111 mmol/L 106 105 109  CO2 22 - 32 mmol/L 28 27 26   Calcium 8.9 - 10.3 mg/dL 9.2 8.7(L) 8.4(L)  Total Protein 6.5 - 8.1 g/dL 7.3 7.0 6.9  Total Bilirubin 0.3 - 1.2  mg/dL 0.5 0.8 0.5  Alkaline Phos 38 - 126 U/L 135(H) 139(H) 170(H)  AST 15 - 41 U/L 20 16 15   ALT 0 - 44 U/L 17 16 16        DIAGNOSTIC IMAGING:  I have independently reviewed the scans and discussed with the patient.   I have reviewed Francene Finders, NP's note and agree with the documentation.  I personally performed a face-to-face visit, made revisions and my assessment and plan is as follows.    ASSESSMENT & PLAN:   Iron deficiency anemia 1.  Normocytic anemia: -Combination anemia from iron deficiency, CKD and mild blood loss. - Colonoscopy on 02/26/2019 shows normal terminal ileum, 2 tubular adenomas in the proximal ascending colon removed, diverticulosis in the ascending, descending, sigmoid and rectosigmoid colon, external and internal hemorrhoids. -Last Feraheme infusion on 01/19/2019 and 01/26/2019. - Patient denies any bleeding per rectum in the past couple of months. -We reviewed her blood work.  Ferritin improved to 207.  Hemoglobin is 10.7.  She does not require any parenteral iron therapy at this time. -We will see her back in 3 months for follow-up with repeat blood work. -She was told to start taking 1 iron tablet daily with stool softener.  2.  IgA kappa MGUS: - She had SPEP which was negative for M spike all the time.  However immunofixation in March and November 2018 was positive for IgA kappa. -Free light chain ratio is normal.  Normal calcium.  No other intervention necessary.      Orders placed this encounter:  Orders Placed This Encounter  Procedures  . Lactate dehydrogenase  . Protein electrophoresis, serum  . Immunofixation electrophoresis  . Kappa/lambda light chains  . CBC with Differential/Platelet  . Comprehensive metabolic panel  . Ferritin  . Iron and TIBC  . Vitamin B12  . Folate      Derek Jack, MD [place] [phone]

## 2019-03-10 NOTE — Patient Instructions (Addendum)
[facility] at [facility] Discharge Instructions  Follow up in 3 months with labs prior to your visit.    Thank you for choosing [facility] at [facility] to provide your oncology and hematology care.  To afford each patient quality time with our provider, please arrive at least 15 minutes before your scheduled appointment time.   If you have a lab appointment with the Cancer Center please come in thru the  Main Entrance and check in at the main information desk  You need to re-schedule your appointment should you arrive 10 or more minutes late.  We strive to give you quality time with our providers, and arriving late affects you and other patients whose appointments are after yours.  Also, if you no show three or more times for appointments you may be dismissed from the clinic at the providers discretion.     Again, thank you for choosing [facility].  Our hope is that these requests will decrease the amount of time that you wait before being seen by our physicians.       _____________________________________________________________  Should you have questions after your visit to [facility], please contact our office at [phone] between the hours of 8:00 a.m. and 4:30 p.m.  Voicemails left after 4:00 p.m. will not be returned until the following business day.  For prescription refill requests, have your pharmacy contact our office and allow 72 hours.    Cancer Center Support Programs:   > Cancer Support Group  2nd Tuesday of the month 1pm-2pm, Journey Room    

## 2019-03-10 NOTE — Assessment & Plan Note (Signed)
1.  Normocytic anemia: -Combination anemia from iron deficiency, CKD and mild blood loss. - Colonoscopy on 02/26/2019 shows normal terminal ileum, 2 tubular adenomas in the proximal ascending colon removed, diverticulosis in the ascending, descending, sigmoid and rectosigmoid colon, external and internal hemorrhoids. -Last Feraheme infusion on 01/19/2019 and 01/26/2019. - Patient denies any bleeding per rectum in the past couple of months. -We reviewed her blood work.  Ferritin improved to 207.  Hemoglobin is 10.7.  She does not require any parenteral iron therapy at this time. -We will see her back in 3 months for follow-up with repeat blood work. -She was told to start taking 1 iron tablet daily with stool softener.  2.  IgA kappa MGUS: - She had SPEP which was negative for M spike all the time.  However immunofixation in March and November 2018 was positive for IgA kappa. -Free light chain ratio is normal.  Normal calcium.  No other intervention necessary.

## 2019-03-12 ENCOUNTER — Other Ambulatory Visit: Payer: Self-pay | Admitting: Family Medicine

## 2019-03-17 ENCOUNTER — Other Ambulatory Visit: Payer: Self-pay | Admitting: Family Medicine

## 2019-03-17 ENCOUNTER — Other Ambulatory Visit: Payer: Self-pay | Admitting: Orthopedic Surgery

## 2019-03-19 ENCOUNTER — Encounter: Payer: Self-pay | Admitting: Internal Medicine

## 2019-03-26 ENCOUNTER — Ambulatory Visit: Payer: Medicare Other | Admitting: Family Medicine

## 2019-04-01 ENCOUNTER — Other Ambulatory Visit: Payer: Self-pay

## 2019-04-01 ENCOUNTER — Ambulatory Visit: Payer: Medicare Other | Admitting: Family Medicine

## 2019-04-06 ENCOUNTER — Telehealth: Payer: Self-pay | Admitting: Family Medicine

## 2019-04-06 NOTE — Telephone Encounter (Signed)
Called pt let her know her appt was in June and that she didn't need to come back into the office until then. Expressed our thoughts over her dog. She thanks everyone in the office and she will see [place] in June

## 2019-04-06 NOTE — Telephone Encounter (Signed)
Please call pt and let her know that I was able to see her most recent BP on 03/10 and that was much better than when she was in the office and a good BP was recorded. She will not need to come in for her missed BP check last week since that was good, and I look forward to seeing her in June! Let her know that I am sorry that she lost her dog last week, and hopefully over time her feeling of loss , will get better

## 2019-04-10 ENCOUNTER — Other Ambulatory Visit: Payer: Self-pay | Admitting: Family Medicine

## 2019-04-10 ENCOUNTER — Other Ambulatory Visit: Payer: Self-pay | Admitting: Orthopedic Surgery

## 2019-04-13 ENCOUNTER — Other Ambulatory Visit: Payer: Self-pay | Admitting: Internal Medicine

## 2019-04-22 ENCOUNTER — Ambulatory Visit: Payer: Medicare Other | Admitting: Gastroenterology

## 2019-05-12 ENCOUNTER — Other Ambulatory Visit: Payer: Self-pay | Admitting: Orthopedic Surgery

## 2019-05-18 ENCOUNTER — Ambulatory Visit (HOSPITAL_COMMUNITY): Payer: Medicare Other

## 2019-05-26 ENCOUNTER — Telehealth: Payer: Self-pay | Admitting: Orthopedic Surgery

## 2019-05-26 NOTE — Telephone Encounter (Signed)
The pharmacist from [facility] called stating that [name] has requested a refill on Prednisone.  I told him that I would put in a request to Dr. Aline Brochure.  I looked in her visit history and she has not been here since 2019.  I called and left a message asking [name] to call me back.    She did call back and I told her that [facility] had called and asked to refill her prescription.  I asked  her why she was using the Prednisone  and she stated it was for her eyes.  I offered to make an appointment for her for any orthopedic problem that she has.  She kept mentioning about her other triggers fingers.  I  asked her again if I could schedule her.  She said she just wanted to wait a while and would call us to schedule an appointment.

## 2019-05-28 ENCOUNTER — Telehealth: Payer: Self-pay | Admitting: Radiology

## 2019-05-28 NOTE — Telephone Encounter (Signed)
[facility] has called back and said that they are concerned for patient to be off the prednisone without tapering, since she has been taking the prednisone for so long.  Patient will be out of the medication this weekend.   I called patient as well, discussed.  She has taken the prednisone for years.  She has myasthenia gravis, saw Dr Merlene Laughter once, does not want to go back there.  She has two eye doctors in [place], and she will call them to see if one of them will fill the Rx for her.   No further action needed from [place].

## 2019-05-28 NOTE — Telephone Encounter (Signed)
RE: 05/12/19 refill request via "interface surescripts".  Patient called (via voice message received late afternoon 05/27/19) asking about status of Prednisone prescription* (refer to separate phone note in which patient relayed in conversation with front office staff that she would like to have the refill for this medication, although it is for her eyes).  On voice message, patient stated she also reached out to her primary care,  Dr Moshe Cipro.  Today, 05/28/19, call received from [facility], per Anderson Malta, with question about this medication. Transferred call to supervisor Abigail Butts.

## 2019-05-28 NOTE — Telephone Encounter (Signed)
error 

## 2019-06-01 ENCOUNTER — Telehealth: Payer: Self-pay | Admitting: Orthopedic Surgery

## 2019-06-01 ENCOUNTER — Other Ambulatory Visit: Payer: Self-pay | Admitting: Orthopedic Surgery

## 2019-06-01 MED ORDER — PREDNISONE 5 MG PO TABS: 5.0000 mg | ORAL_TABLET | Freq: Every day | ORAL | 0 refills | Status: DC

## 2019-06-01 NOTE — Telephone Encounter (Signed)
Can we discuss?

## 2019-06-01 NOTE — Telephone Encounter (Signed)
[name], pharamcist from [facility] called this morning stating that if Dr Aline Brochure could not give this patient a prescription for Prednisone, would he at least give her Prednisone taper? She further states that [name] has been on the Prednisone for quite a while and her PCP Dr Moshe Cipro, will not do this because she was not the physician giving this medication previously.  Please call [name], pharmacist at [facility]   [phone]  Thanks

## 2019-06-03 ENCOUNTER — Other Ambulatory Visit (HOSPITAL_COMMUNITY): Payer: Medicare Other

## 2019-06-10 ENCOUNTER — Inpatient Hospital Stay (HOSPITAL_COMMUNITY): Payer: Medicare Other | Attending: Hematology

## 2019-06-10 ENCOUNTER — Other Ambulatory Visit: Payer: Self-pay

## 2019-06-10 ENCOUNTER — Ambulatory Visit (HOSPITAL_COMMUNITY): Payer: Medicare Other | Admitting: Nurse Practitioner

## 2019-06-10 DIAGNOSIS — Z833 Family history of diabetes mellitus: Secondary | ICD-10-CM | POA: Insufficient documentation

## 2019-06-10 DIAGNOSIS — Z809 Family history of malignant neoplasm, unspecified: Secondary | ICD-10-CM | POA: Diagnosis not present

## 2019-06-10 DIAGNOSIS — M2550 Pain in unspecified joint: Secondary | ICD-10-CM | POA: Diagnosis not present

## 2019-06-10 DIAGNOSIS — I129 Hypertensive chronic kidney disease with stage 1 through stage 4 chronic kidney disease, or unspecified chronic kidney disease: Secondary | ICD-10-CM | POA: Insufficient documentation

## 2019-06-10 DIAGNOSIS — E039 Hypothyroidism, unspecified: Secondary | ICD-10-CM | POA: Insufficient documentation

## 2019-06-10 DIAGNOSIS — E611 Iron deficiency: Secondary | ICD-10-CM | POA: Diagnosis not present

## 2019-06-10 DIAGNOSIS — R5383 Other fatigue: Secondary | ICD-10-CM | POA: Insufficient documentation

## 2019-06-10 DIAGNOSIS — Z87891 Personal history of nicotine dependence: Secondary | ICD-10-CM | POA: Insufficient documentation

## 2019-06-10 DIAGNOSIS — D472 Monoclonal gammopathy: Secondary | ICD-10-CM | POA: Diagnosis not present

## 2019-06-10 DIAGNOSIS — R0602 Shortness of breath: Secondary | ICD-10-CM | POA: Insufficient documentation

## 2019-06-10 DIAGNOSIS — M25552 Pain in left hip: Secondary | ICD-10-CM | POA: Diagnosis not present

## 2019-06-10 DIAGNOSIS — M25551 Pain in right hip: Secondary | ICD-10-CM | POA: Insufficient documentation

## 2019-06-10 DIAGNOSIS — Z79899 Other long term (current) drug therapy: Secondary | ICD-10-CM | POA: Diagnosis not present

## 2019-06-10 DIAGNOSIS — Z841 Family history of disorders of kidney and ureter: Secondary | ICD-10-CM | POA: Insufficient documentation

## 2019-06-10 DIAGNOSIS — D508 Other iron deficiency anemias: Secondary | ICD-10-CM

## 2019-06-10 DIAGNOSIS — M549 Dorsalgia, unspecified: Secondary | ICD-10-CM | POA: Diagnosis not present

## 2019-06-10 DIAGNOSIS — D631 Anemia in chronic kidney disease: Secondary | ICD-10-CM | POA: Diagnosis not present

## 2019-06-10 DIAGNOSIS — K5900 Constipation, unspecified: Secondary | ICD-10-CM | POA: Insufficient documentation

## 2019-06-10 DIAGNOSIS — E1122 Type 2 diabetes mellitus with diabetic chronic kidney disease: Secondary | ICD-10-CM | POA: Insufficient documentation

## 2019-06-10 DIAGNOSIS — M7989 Other specified soft tissue disorders: Secondary | ICD-10-CM | POA: Diagnosis not present

## 2019-06-10 DIAGNOSIS — M1990 Unspecified osteoarthritis, unspecified site: Secondary | ICD-10-CM | POA: Diagnosis not present

## 2019-06-10 DIAGNOSIS — N183 Chronic kidney disease, stage 3 (moderate): Secondary | ICD-10-CM | POA: Diagnosis not present

## 2019-06-10 DIAGNOSIS — Z8249 Family history of ischemic heart disease and other diseases of the circulatory system: Secondary | ICD-10-CM | POA: Insufficient documentation

## 2019-06-10 LAB — CBC WITH DIFFERENTIAL/PLATELET
Abs Immature Granulocytes: 0.02 10*3/uL (ref 0.00–0.07)
Basophils Absolute: 0 10*3/uL (ref 0.0–0.1)
Basophils Relative: 0 %
Eosinophils Absolute: 0.2 10*3/uL (ref 0.0–0.5)
Eosinophils Relative: 3 %
HCT: 32.7 % — ABNORMAL LOW (ref 36.0–46.0)
Hemoglobin: 9.9 g/dL — ABNORMAL LOW (ref 12.0–15.0)
Immature Granulocytes: 0 %
Lymphocytes Relative: 18 %
Lymphs Abs: 1.5 10*3/uL (ref 0.7–4.0)
MCH: 28.4 pg (ref 26.0–34.0)
MCHC: 30.3 g/dL (ref 30.0–36.0)
MCV: 93.7 fL (ref 80.0–100.0)
Monocytes Absolute: 0.8 10*3/uL (ref 0.1–1.0)
Monocytes Relative: 10 %
Neutro Abs: 5.9 10*3/uL (ref 1.7–7.7)
Neutrophils Relative %: 69 %
Platelets: 263 10*3/uL (ref 150–400)
RBC: 3.49 MIL/uL — ABNORMAL LOW (ref 3.87–5.11)
RDW: 15.8 % — ABNORMAL HIGH (ref 11.5–15.5)
WBC: 8.5 10*3/uL (ref 4.0–10.5)
nRBC: 0 % (ref 0.0–0.2)

## 2019-06-10 LAB — COMPREHENSIVE METABOLIC PANEL WITH GFR
ALT: 15 U/L (ref 0–44)
AST: 18 U/L (ref 15–41)
Albumin: 3.5 g/dL (ref 3.5–5.0)
Alkaline Phosphatase: 133 U/L — ABNORMAL HIGH (ref 38–126)
Anion gap: 9 (ref 5–15)
BUN: 18 mg/dL (ref 8–23)
CO2: 20 mmol/L — ABNORMAL LOW (ref 22–32)
Calcium: 8.6 mg/dL — ABNORMAL LOW (ref 8.9–10.3)
Chloride: 108 mmol/L (ref 98–111)
Creatinine, Ser: 1.04 mg/dL — ABNORMAL HIGH (ref 0.44–1.00)
GFR calc Af Amer: >60 mL/min
GFR calc non Af Amer: 52 mL/min — ABNORMAL LOW
Glucose, Bld: 159 mg/dL — ABNORMAL HIGH (ref 70–99)
Potassium: 3.7 mmol/L (ref 3.5–5.1)
Sodium: 137 mmol/L (ref 135–145)
Total Bilirubin: 0.2 mg/dL — ABNORMAL LOW (ref 0.3–1.2)
Total Protein: 7 g/dL (ref 6.5–8.1)

## 2019-06-10 LAB — LACTATE DEHYDROGENASE: LDH: 205 U/L — ABNORMAL HIGH (ref 98–192)

## 2019-06-10 LAB — IRON AND TIBC
Iron: 51 ug/dL (ref 28–170)
Saturation Ratios: 18 % (ref 10.4–31.8)
TIBC: 277 ug/dL (ref 250–450)
UIBC: 226 ug/dL

## 2019-06-10 LAB — FERRITIN: Ferritin: 61 ng/mL (ref 11–307)

## 2019-06-10 LAB — VITAMIN B12: Vitamin B-12: 439 pg/mL (ref 180–914)

## 2019-06-10 LAB — FOLATE: Folate: 10.5 ng/mL (ref >5.9)

## 2019-06-11 LAB — KAPPA/LAMBDA LIGHT CHAINS
Kappa free light chain: 35.7 mg/L — ABNORMAL HIGH (ref 3.3–19.4)
Kappa, lambda light chain ratio: 1.52 (ref 0.26–1.65)
Lambda free light chains: 23.5 mg/L (ref 5.7–26.3)

## 2019-06-12 LAB — IMMUNOFIXATION ELECTROPHORESIS
IgA: 255 mg/dL (ref 64–422)
IgG (Immunoglobin G), Serum: 1320 mg/dL (ref 586–1602)
IgM (Immunoglobulin M), Srm: 127 mg/dL (ref 26–217)
Total Protein ELP: 6.6 g/dL (ref 6.0–8.5)

## 2019-06-12 LAB — PROTEIN ELECTROPHORESIS, SERUM
A/G Ratio: 1.1 (ref 0.7–1.7)
Albumin ELP: 3.5 g/dL (ref 2.9–4.4)
Alpha-1-Globulin: 0.2 g/dL (ref 0.0–0.4)
Alpha-2-Globulin: 0.7 g/dL (ref 0.4–1.0)
Beta Globulin: 1 g/dL (ref 0.7–1.3)
Gamma Globulin: 1.3 g/dL (ref 0.4–1.8)
Globulin, Total: 3.2 g/dL (ref 2.2–3.9)
Total Protein ELP: 6.7 g/dL (ref 6.0–8.5)

## 2019-06-17 ENCOUNTER — Encounter: Payer: Self-pay | Admitting: Family Medicine

## 2019-06-17 ENCOUNTER — Other Ambulatory Visit: Payer: Self-pay

## 2019-06-17 ENCOUNTER — Ambulatory Visit (INDEPENDENT_AMBULATORY_CARE_PROVIDER_SITE_OTHER): Payer: Medicare Other | Admitting: Family Medicine

## 2019-06-17 VITALS — BP 140/51 | HR 57 | Resp 20 | Ht 66.0 in | Wt 266.0 lb

## 2019-06-17 DIAGNOSIS — Z0000 Encounter for general adult medical examination without abnormal findings: Secondary | ICD-10-CM

## 2019-06-17 NOTE — Progress Notes (Signed)
Subjective:   [name] is a 78 y.o. female who presents for Medicare Annual (Subsequent) preventive examination.  Location of Patient: Home Location of Provider: Telehealth Consent was obtain for visit to be over via telehealth.   I verified that I am speaking with the correct person using two identifiers.   Review of Systems:    Cardiac Risk Factors include: advanced age (>71men, >31 women);diabetes mellitus;dyslipidemia;hypertension;obesity (BMI >30kg/m2)     Objective:     Vitals: BP (!) 140/51   Pulse (!) 57   Resp 20   Ht 5\' 6"  (1.676 m)   Wt 266 lb (120.7 kg)   BMI 42.93 kg/m   Body mass index is 42.93 kg/m.  Advanced Directives 02/26/2019 02/19/2019 01/26/2019 01/19/2019 01/12/2019 01/07/2019 12/16/2018  Does Patient Have a Medical Advance Directive? No No No No No No No  Type of Advance Directive - - - - - - -  Does patient want to make changes to medical advance directive? - - No - Patient declined No - Patient declined No - Patient declined No - Patient declined No - Patient declined  Would patient like information on creating a medical advance directive? No - Patient declined No - Patient declined No - Patient declined No - Patient declined No - Patient declined No - Patient declined No - Patient declined  Pre-existing out of facility DNR order (yellow form or pink MOST form) - - - - - - -    Tobacco Social History   Tobacco Use  Smoking Status Former Smoker  . Packs/day: 0.50  . Years: 25.00  . Pack years: 12.50  . Types: Cigarettes  . Quit date: 01/01/2004  . Years since quitting: 15.4  Smokeless Tobacco Never Used     Counseling given: Yes   Clinical Intake:  Pre-visit preparation completed: Yes  Pain : 0-10 Pain Score: 8  Pain Type: Chronic pain Pain Location: Knee Pain Orientation: Right, Left Pain Descriptors / Indicators: Aching Pain Onset: More than a month ago Pain Frequency: Constant Pain Relieving Factors: pain medicine Effect  of Pain on Daily Activities: yes  Pain Relieving Factors: pain medicine  BMI - recorded: 42.93 Nutritional Status: BMI > 30  Obese Nutritional Risks: None Diabetes: Yes CBG done?: No Did pt. bring in CBG monitor from home?: No  How often do you need to have someone help you when you read instructions, pamphlets, or other written materials from your doctor or pharmacy?: 3 - Sometimes(because the type is too small) What is the last grade level you completed in school?: 10  Interpreter Needed?: No     Past Medical History:  Diagnosis Date  . Anemia in chronic renal disease 12/30/2015  . Anxiety   . Arthritis    "all over" (01/19/2014)  . Arthritis, lumbar spine   . Carpal tunnel syndrome   . Chronic bronchitis ([place])    "got it q yr for awhile; hasn't had it in awhile" (01/19/2014)  . Chronic kidney disease (CKD) stage G3b/A1, moderately decreased glomerular filtration rate (GFR) between 30-44 mL/min/1.73 square meter and albuminuria creatinine ratio less than 30 mg/g (HCC) 09/14/2009   Qualifier: Diagnosis of  By: Moshe Cipro MD, Joycelyn Schmid    . Chronic renal disease, stage 3, moderately decreased glomerular filtration rate (GFR) between 30-59 mL/min/1.73 square meter (HCC) 09/14/2009   Qualifier: Diagnosis of  By: Moshe Cipro MD, Joycelyn Schmid    . Complication of anesthesia    combative  . COPD (chronic obstructive pulmonary disease) ([place])   .  Depression   . Diverticulosis 07/2004   Colonscopy Dr Gala Romney  . Esophagitis, erosive 2009  . Exertional shortness of breath   . Gastroesophageal reflux   . XNATFTDD(220.2)    "usually a couple times/wk" (01/19/2014)  . Heart murmur    saw cardiology In [place], he told her she did not need to come back.  . Hyperlipidemia   . Hypertension   . Impingement syndrome, shoulder   . Iron deficiency anemia   . Mitral regurgitation   . Myasthenia gravis   . Myasthenia gravis in remission ([place]) 11/24/2014  . Myasthenia gravis in remission ([place])   .  Obesity   . OSA on CPAP    Negative on last sleep study  . Pneumonia 01/2012  . Pulmonary HTN ([place])   . Schatzki's ring    Last EGD w/ dilation 02/08/11, 2009 & 2007  . Seasonal allergies   . Shoulder pain   . Thyroid disease    "used to take RX; they took me off it" (01/19/2014)  . Tobacco abuse   . Type II diabetes mellitus ([place])    Past Surgical History:  Procedure Laterality Date  . [place] VITRECTOMY WITH 20 GAUGE MVR PORT Left 01/19/2014   Procedure: 25 GAUGE PARS PLANA VITRECTOMY WITH 20 GAUGE MVR PORT; MEMBRAME PEEL; SERUM PATCH; LASER TREATMENT; C3F8;  Surgeon: Hayden Pedro, MD;  Location: [facility];  Service: Ophthalmology;  Laterality: Left;  . ABDOMINAL HYSTERECTOMY    . APPENDECTOMY    . CARPAL TUNNEL RELEASE Bilateral   . CATARACT EXTRACTION W/PHACO Left 10/21/2013   Procedure: LEFT CATARACT EXTRACTION PHACO AND INTRAOCULAR LENS PLACEMENT (IOC);  Surgeon: Marylynn Pearson, MD;  Location: [facility];  Service: Ophthalmology;  Laterality: Left;  . CHOLECYSTECTOMY    . COLONOSCOPY  05/08/2012   RKY:HCWCBJSE and external hemorrhoids; colonic diverticulosis  . COLONOSCOPY WITH PROPOFOL N/A 02/26/2019   Procedure: COLONOSCOPY WITH PROPOFOL;  Surgeon: Danie Binder, MD;  [facility];  Service: Endoscopy;  Laterality: N/A;  9:00am  . ESOPHAGEAL DILATION     "more than 3 times" (01/19/2014)  . ESOPHAGOGASTRODUODENOSCOPY  02/08/11   [place]-Distal esophageal erosion consistent with mild erosive reflux   esophagitis/ Noncritical Schatzki ring, small hiatal hernia otherwise upper/ gastrointestinal tract appeared unremarkable, status post passage  of a Maloney dilation to biopsy disruption of the ring described  . ESOPHAGOGASTRODUODENOSCOPY  04/2012   2 tandem incomplete distal esophagea rings s/p dilation.   . ESOPHAGOGASTRODUODENOSCOPY N/A 09/16/2014   Dr. Gala Romney: Schatzki ring status post dilation/disruption.  Hiatal hernia.  Marland Kitchen ESOPHAGOGASTRODUODENOSCOPY (EGD) WITH PROPOFOL  N/A 12/08/2018   EGD with mild Schatzki ring s/p dilation, small hiatal hernia, otherwise normal  . EYE SURGERY     cataracts, bilateral  . INCONTINENCE SURGERY  08/26/09   [place]  . JOINT REPLACEMENT     right total knee  . MALONEY DILATION N/A 09/16/2014   Procedure: Venia Minks DILATION;  Surgeon: Daneil Dolin, MD;  [facility];  Service: Endoscopy;  Laterality: N/A;  . Venia Minks DILATION N/A 12/08/2018   Procedure: Venia Minks DILATION;  Surgeon: Daneil Dolin, MD;  [facility];  Service: Endoscopy;  Laterality: N/A;  . PARS PLANA VITRECTOMY W/ REPAIR OF MACULAR HOLE Left 01/19/2014  . POLYPECTOMY  02/26/2019   Procedure: POLYPECTOMY;  Surgeon: Danie Binder, MD;  [facility];  Service: Endoscopy;;  ascending colon polyps x2  . SAVORY DILATION N/A 09/16/2014   Procedure: SAVORY  DILATION;  Surgeon: Daneil Dolin, MD;  [facility];  Service: Endoscopy;  Laterality: N/A;  . TONSILLECTOMY    . TOTAL KNEE ARTHROPLASTY Right 05/13/07   Dr. Aline Brochure  . TRIGGER FINGER RELEASE Right 06/26/2018   Procedure: RIGHT INDEX FINGER TRIGGER FINGER/A-1 PULLEY RELEASE;  Surgeon: Carole Civil, MD;  Location: [facility];  Service: Orthopedics;  Laterality: Right;   Family History  Problem Relation Age of Onset  . Cancer Mother   . Heart disease Mother   . Cancer Father   . Heart disease Father   . Diabetes Sister   . Hypertension Brother   . Heart disease Brother   . Cancer Sister   . Kidney failure Sister   . Diabetes Son   . Hypertension Son   . Hypertension Daughter   . Colon cancer Neg Hx    Social History   Socioeconomic History  . Marital status: Single    Spouse name: Not on file  . Number of children: 2  . Years of education: Not on file  . Highest education level: Not on file  Occupational History  . Occupation: disabled    Employer: RETIRED  Social Needs  . Financial resource strain: Not very hard  . Food insecurity    Worry:  Never true    Inability: Never true  . Transportation needs    Medical: Yes    Non-medical: No  Tobacco Use  . Smoking status: Former Smoker    Packs/day: 0.50    Years: 25.00    Pack years: 12.50    Types: Cigarettes    Quit date: 01/01/2004    Years since quitting: 15.4  . Smokeless tobacco: Never Used  Substance and Sexual Activity  . Alcohol use: No  . Drug use: No  . Sexual activity: Never    Birth control/protection: Surgical  Lifestyle  . Physical activity    Days per week: 0 days    Minutes per session: 0 min  . Stress: Only a little  Relationships  . Social Herbalist on phone: More than three times a week    Gets together: Never    Attends religious service: Never    Active member of club or organization: No    Attends meetings of clubs or organizations: Never    Relationship status: Never married  Other Topics Concern  . Not on file  Social History Narrative   Patient lives at home by herself   Patient drinks one cup of coffee a day   Patient is right handed.    Outpatient Encounter Medications as of 06/17/2019  Medication Sig  . amLODipine (NORVASC) 10 MG tablet TAKE 1 TABLET BY MOUTH ONCE A DAY.  Marland Kitchen B-D ULTRAFINE III SHORT PEN 31G X 8 MM MISC USE ONCE DAILY WITH LANTUS SOLOSTAR PEN.  . brimonidine-timolol (COMBIGAN) 0.2-0.5 % ophthalmic solution Place 1 drop into both eyes every 12 (twelve) hours.  . cetirizine (ZYRTEC) 10 MG tablet TAKE 1 TABLET BY MOUTH ONCE A DAY.  Marland Kitchen Cholecalciferol (VITAMIN D3) 2000 units TABS Take 2,000 Units by mouth at bedtime.  . citalopram (CELEXA) 20 MG tablet TAKE 1 TABLET BY MOUTH ONCE A DAY.  Marland Kitchen Cranberry 1000 MG CAPS Take 1,000 mg by mouth daily.  . cycloSPORINE (RESTASIS) 0.05 % ophthalmic emulsion Place 1 drop into both eyes 2 (two) times daily.   Marland Kitchen diltiazem (CARDIZEM CD) 240 MG 24 hr capsule TAKE (1) CAPSULE BY MOUTH TWICE DAILY.  Marland Kitchen  docusate sodium (COLACE) 100 MG capsule Take 100 mg by mouth at bedtime.  Marland Kitchen  ezetimibe (ZETIA) 10 MG tablet TAKE 1 TABLET BY MOUTH ONCE A DAY.  Marland Kitchen Flaxseed, Linseed, (FLAXSEED OIL) 1000 MG CAPS Take 1,000 mg by mouth daily.  . fluticasone (FLONASE) 50 MCG/ACT nasal spray PLACE 2 SPRAYS INTO BOTH NOSTRILS DAILY.  Marland Kitchen gabapentin (NEURONTIN) 300 MG capsule Take 1 capsule (300 mg total) by mouth 2 (two) times daily.  . hydrALAZINE (APRESOLINE) 50 MG tablet Take 1 tablet (50 mg total) by mouth 3 (three) times daily.  Marland Kitchen LANTUS SOLOSTAR 100 UNIT/ML Solostar Pen INJECT 30 UNITS SUBCUTANEOUSLY ONCE DAILY. (Patient taking differently: Inject 30 Units into the skin daily. )  . losartan (COZAAR) 100 MG tablet TAKE (1) TABLET BY MOUTH DAILY.  . Multiple Vitamin (MULTIVITAMIN WITH MINERALS) TABS tablet Take 1 tablet by mouth at bedtime.  . ondansetron (ZOFRAN) 4 MG tablet TAKE (1) TABLET BY MOUTH EVERY FOUR HOURS AS NEEDED FOR NAUSEA. (Patient taking differently: Take 4 mg by mouth every 4 (four) hours as needed for nausea or vomiting. TAKE (1) TABLET BY MOUTH EVERY FOUR HOURS AS NEEDED FOR NAUSEA.)  . ONETOUCH DELICA LANCETS 20B MISC USE AS DIRECTED 3 TIMES DAILY FOR BLOOD GLUCOSE TESTING.  Marland Kitchen PAIN RELIEF EXTRA STRENGTH 500 MG tablet TAKE (1) TABLET BY MOUTH AT BEDTIME.  . pantoprazole (PROTONIX) 40 MG tablet TAKE 1 TABLET BY MOUTH TWICE DAILY BEFORE A MEAL.  Marland Kitchen polyethylene glycol powder (GLYCOLAX/MIRALAX) powder MIX 17G (1 CAPFUL) IN 8 OUNCES OF JUICE OR WATER AND DRINK ONCE DAILY.  Marland Kitchen predniSONE (DELTASONE) 5 MG tablet Take 1 tablet (5 mg total) by mouth daily.  Marland Kitchen PROAIR HFA 108 (90 Base) MCG/ACT inhaler INHALE 2 PUFFS BY MOUTH EVERY 6 HOURS AS NEEDED . (Patient taking differently: Inhale 2 puffs into the lungs every 6 (six) hours as needed for wheezing or shortness of breath. )  . pyridostigmine (MESTINON) 60 MG tablet TAKE (1) TABLET BY MOUTH THREE TIMES A DAY.  . rosuvastatin (CRESTOR) 40 MG tablet TAKE (1) TABLET BY MOUTH AT BEDTIME.  Marland Kitchen solifenacin (VESICARE) 10 MG tablet TAKE 1 TABLET  BY MOUTH ONCE A DAY.  Marland Kitchen spironolactone (ALDACTONE) 25 MG tablet TAKE 1 TABLET BY MOUTH ONCE A DAY.  . TRADJENTA 5 MG TABS tablet TAKE 1 TABLET BY MOUTH ONCE A DAY.  Marland Kitchen UNABLE TO FIND Diabetic shoes x 1 Inserts x 3 DX E11.9  . venlafaxine XR (EFFEXOR-XR) 150 MG 24 hr capsule TAKE 1 CAPSULE BY MOUTH DAILY.   No facility-administered encounter medications on file as of 06/17/2019.     Activities of Daily Living In your present state of health, do you have any difficulty performing the following activities: 06/17/2019 02/19/2019  Hearing? N N  Vision? Y Y  Comment sometimes right eye decreased  Difficulty concentrating or making decisions? Y N  Comment sometimes remembering -  Walking or climbing stairs? Y N  Dressing or bathing? N N  Doing errands, shopping? Y N  Preparing Food and eating ? N -  Using the Toilet? N -  In the past six months, have you accidently leaked urine? N -  Do you have problems with loss of bowel control? N -  Managing your Medications? N -  Managing your Finances? N -  Housekeeping or managing your Housekeeping? N -  Some recent data might be hidden    Patient Care Team: Fayrene Helper, MD as PCP - General Manus Rudd  Jerilynn Mages, MD (Gastroenterology) Marylynn Pearson, MD as Consulting Physician (Ophthalmology) Phillips Odor, MD as Consulting Physician (Neurology) Hayden Pedro, MD as Consulting Physician (Ophthalmology) Whitney Muse, Kelby Fam, MD (Inactive) as Consulting Physician (Hematology and Oncology) Carole Civil, MD as Consulting Physician (Orthopedic Surgery)    Assessment:   This is a routine wellness examination for Ryan.  Exercise Activities and Dietary recommendations Current Exercise Habits: The patient does not participate in regular exercise at present, Exercise limited by: orthopedic condition(s)  Goals    . Exercise 3x per week (30 min per time)     Recommend starting a routine exercise program at least 3 days a week for 30-45  minutes at a time as tolerated.         Fall Risk Fall Risk  06/17/2019 03/04/2019 12/16/2018 10/16/2018 07/17/2018  Falls in the past year? 1 0 1 No No  Number falls in past yr: 1 0 1 - -  Injury with Fall? 0 0 0 - -  Risk Factor Category  - - - - -  Risk for fall due to : - - History of fall(s);Impaired balance/gait;Impaired mobility;Impaired vision - -  Follow up - - - - -  Comment - - - - -   Is the patient's home free of loose throw rugs in walkways, pet beds, electrical cords, etc?   yes      Grab bars in the bathroom? yes      Handrails on the stairs?   yes      Adequate lighting?   yes     Depression Screen PHQ 2/9 Scores 06/17/2019 03/04/2019 12/16/2018 10/16/2018  PHQ - 2 Score 1 1 2  0  PHQ- 9 Score - 5 14 -     Cognitive Function     6CIT Screen 06/17/2019 05/09/2018 05/06/2017  What Year? 0 points 0 points 0 points  What month? 0 points 0 points 0 points  What time? 0 points 0 points 0 points  Count back from 20 0 points 2 points 0 points  Months in reverse 0 points 0 points 0 points  Repeat phrase - 0 points 0 points  Total Score - 2 0    Immunization History  Administered Date(s) Administered  . Influenza Split 09/30/2012  . Influenza Whole 09/24/2007, 11/02/2009, 09/18/2010, 09/18/2011  . Influenza,inj,Quad PF,6+ Mos 12/16/2013, 08/26/2014, 09/07/2015, 09/10/2016, 09/11/2017, 10/16/2018  . Pneumococcal Conjugate-13 01/26/2015  . Pneumococcal Polysaccharide-23 07/12/2004, 09/18/2011  . Td 07/12/2004  . Zoster 01/05/2007    Qualifies for Shingles Vaccine? Yes, needs to check with insurance  Screening Tests Health Maintenance  Topic Date Due  . HEMOGLOBIN A1C  04/17/2019  . TETANUS/TDAP  07/21/2019 (Originally 07/12/2014)  . FOOT EXAM  07/21/2019  . INFLUENZA VACCINE  08/01/2019  . OPHTHALMOLOGY EXAM  02/25/2020  . DEXA SCAN  Completed  . PNA vac Low Risk Adult  Completed    Cancer Screenings: Lung: Low Dose CT Chest recommended if Age 24-80 years,  30 pack-year currently smoking OR have quit w/in 15years. Patient does not qualify. Breast:  Up to date on Mammogram? Yes   Up to date of Bone Density/Dexa? Yes Colorectal:  Completed recently 2020  Additional Screenings:   Hepatitis C Screening: Can be added to next lab draw     Plan:     1. Encounter for Medicare annual wellness exam   I have personally reviewed and noted the following in the patient's chart:   . Medical and social history . Use of  alcohol, tobacco or illicit drugs  . Current medications and supplements . Functional ability and status . Nutritional status . Physical activity . Advanced directives . List of other physicians . Hospitalizations, surgeries, and ER visits in previous 12 months . Vitals . Screenings to include cognitive, depression, and falls . Referrals and appointments  In addition, I have reviewed and discussed with patient certain preventive protocols, quality metrics, and best practice recommendations. A written personalized care plan for preventive services as well as general preventive health recommendations were provided to patient.    I provided 20 minutes of non-face-to-face time during this encounter.    Perlie Mayo, NP  06/17/2019

## 2019-06-17 NOTE — Patient Instructions (Signed)
[name] , Thank you for taking time to come for your Medicare Wellness Visit. I appreciate your ongoing commitment to your health goals. Please review the following plan we discussed and let me know if I can assist you in the future.   Screening recommendations/referrals: Colonoscopy: Completed  Mammogram: Due 2020 Bone Density: Completed  Recommended yearly ophthalmology/optometry visit for glaucoma screening and checkup Recommended yearly dental visit for hygiene and checkup  Vaccinations: Influenza vaccine: Due Fall 2020 Pneumococcal vaccine: Completed  Tdap vaccine: Due 2025 per chart  Shingles vaccine: Call insurance company to see if covered  Conditions/risks identified: Falls   Next appointment: 06/30/2019    Preventive Care 65 Years and Older, Female Preventive care refers to lifestyle choices and visits with your health care provider that can promote health and wellness. What does preventive care include?  A yearly physical exam. This is also called an annual well check.  Dental exams once or twice a year.  Routine eye exams. Ask your health care provider how often you should have your eyes checked.  Personal lifestyle choices, including:  Daily care of your teeth and gums.  Regular physical activity.  Eating a healthy diet.  Avoiding tobacco and drug use.  Limiting alcohol use.  Practicing safe sex.  Taking low-dose aspirin every day.  Taking vitamin and mineral supplements as recommended by your health care provider. What happens during an annual well check? The services and screenings done by your health care provider during your annual well check will depend on your age, overall health, lifestyle risk factors, and family history of disease. Counseling  Your health care provider may ask you questions about your:  Alcohol use.  Tobacco use.  Drug use.  Emotional well-being.  Home and relationship well-being.  Sexual activity.  Eating  habits.  History of falls.  Memory and ability to understand (cognition).  Work and work Statistician.  Reproductive health. Screening  You may have the following tests or measurements:  Height, weight, and BMI.  Blood pressure.  Lipid and cholesterol levels. These may be checked every 5 years, or more frequently if you are over 89 years old.  Skin check.  Lung cancer screening. You may have this screening every year starting at age 80 if you have a 30-pack-year history of smoking and currently smoke or have quit within the past 15 years.  Fecal occult blood test (FOBT) of the stool. You may have this test every year starting at age 60.  Flexible sigmoidoscopy or colonoscopy. You may have a sigmoidoscopy every 5 years or a colonoscopy every 10 years starting at age 66.  Hepatitis C blood test.  Hepatitis B blood test.  Sexually transmitted disease (STD) testing.  Diabetes screening. This is done by checking your blood sugar (glucose) after you have not eaten for a while (fasting). You may have this done every 1-3 years.  Bone density scan. This is done to screen for osteoporosis. You may have this done starting at age 53.  Mammogram. This may be done every 1-2 years. Talk to your health care provider about how often you should have regular mammograms. Talk with your health care provider about your test results, treatment options, and if necessary, the need for more tests. Vaccines  Your health care provider may recommend certain vaccines, such as:  Influenza vaccine. This is recommended every year.  Tetanus, diphtheria, and acellular pertussis (Tdap, Td) vaccine. You may need a Td booster every 10 years.  Zoster vaccine. You may  need this after age 56.  Pneumococcal 13-valent conjugate (PCV13) vaccine. One dose is recommended after age 51.  Pneumococcal polysaccharide (PPSV23) vaccine. One dose is recommended after age 33. Talk to your health care provider about which  screenings and vaccines you need and how often you need them. This information is not intended to replace advice given to you by your health care provider. Make sure you discuss any questions you have with your health care provider. Document Released: 01/13/2016 Document Revised: 09/05/2016 Document Reviewed: 10/18/2015 Elsevier Interactive Patient Education  2017 [employer] Prevention in the Home Falls can cause injuries. They can happen to people of all ages. There are many things you can do to make your home safe and to help prevent falls. What can I do on the outside of my home?  Regularly fix the edges of walkways and driveways and fix any cracks.  Remove anything that might make you trip as you walk through a door, such as a raised step or threshold.  Trim any bushes or trees on the path to your home.  Use bright outdoor lighting.  Clear any walking paths of anything that might make someone trip, such as rocks or tools.  Regularly check to see if handrails are loose or broken. Make sure that both sides of any steps have handrails.  Any raised decks and porches should have guardrails on the edges.  Have any leaves, snow, or ice cleared regularly.  Use sand or salt on walking paths during winter.  Clean up any spills in your garage right away. This includes oil or grease spills. What can I do in the bathroom?  Use night lights.  Install grab bars by the toilet and in the tub and shower. Do not use towel bars as grab bars.  Use non-skid mats or decals in the tub or shower.  If you need to sit down in the shower, use a plastic, non-slip stool.  Keep the floor dry. Clean up any water that spills on the floor as soon as it happens.  Remove soap buildup in the tub or shower regularly.  Attach bath mats securely with double-sided non-slip rug tape.  Do not have throw rugs and other things on the floor that can make you trip. What can I do in the bedroom?  Use  night lights.  Make sure that you have a light by your bed that is easy to reach.  Do not use any sheets or blankets that are too big for your bed. They should not hang down onto the floor.  Have a firm chair that has side arms. You can use this for support while you get dressed.  Do not have throw rugs and other things on the floor that can make you trip. What can I do in the kitchen?  Clean up any spills right away.  Avoid walking on wet floors.  Keep items that you use a lot in easy-to-reach places.  If you need to reach something above you, use a strong step stool that has a grab bar.  Keep electrical cords out of the way.  Do not use floor polish or wax that makes floors slippery. If you must use wax, use non-skid floor wax.  Do not have throw rugs and other things on the floor that can make you trip. What can I do with my stairs?  Do not leave any items on the stairs.  Make sure that there are handrails on both sides  of the stairs and use them. Fix handrails that are broken or loose. Make sure that handrails are as long as the stairways.  Check any carpeting to make sure that it is firmly attached to the stairs. Fix any carpet that is loose or worn.  Avoid having throw rugs at the top or bottom of the stairs. If you do have throw rugs, attach them to the floor with carpet tape.  Make sure that you have a light switch at the top of the stairs and the bottom of the stairs. If you do not have them, ask someone to add them for you. What else can I do to help prevent falls?  Wear shoes that:  Do not have high heels.  Have rubber bottoms.  Are comfortable and fit you well.  Are closed at the toe. Do not wear sandals.  If you use a stepladder:  Make sure that it is fully opened. Do not climb a closed stepladder.  Make sure that both sides of the stepladder are locked into place.  Ask someone to hold it for you, if possible.  Clearly mark and make sure that you  can see:  Any grab bars or handrails.  First and last steps.  Where the edge of each step is.  Use tools that help you move around (mobility aids) if they are needed. These include:  Canes.  Walkers.  Scooters.  Crutches.  Turn on the lights when you go into a dark area. Replace any light bulbs as soon as they burn out.  Set up your furniture so you have a clear path. Avoid moving your furniture around.  If any of your floors are uneven, fix them.  If there are any pets around you, be aware of where they are.  Review your medicines with your doctor. Some medicines can make you feel dizzy. This can increase your chance of falling. Ask your doctor what other things that you can do to help prevent falls. This information is not intended to replace advice given to you by your health care provider. Make sure you discuss any questions you have with your health care provider. Document Released: 10/13/2009 Document Revised: 05/24/2016 Document Reviewed: 01/21/2015 Elsevier Interactive Patient Education  2017 [employer].

## 2019-06-18 ENCOUNTER — Other Ambulatory Visit: Payer: Self-pay

## 2019-06-18 ENCOUNTER — Inpatient Hospital Stay (HOSPITAL_BASED_OUTPATIENT_CLINIC_OR_DEPARTMENT_OTHER): Payer: Medicare Other | Admitting: Nurse Practitioner

## 2019-06-18 VITALS — BP 133/81 | HR 62 | Temp 98.9°F | Resp 16 | Wt 258.2 lb

## 2019-06-18 DIAGNOSIS — D472 Monoclonal gammopathy: Secondary | ICD-10-CM

## 2019-06-18 DIAGNOSIS — M7989 Other specified soft tissue disorders: Secondary | ICD-10-CM | POA: Diagnosis not present

## 2019-06-18 DIAGNOSIS — Z79899 Other long term (current) drug therapy: Secondary | ICD-10-CM

## 2019-06-18 DIAGNOSIS — E039 Hypothyroidism, unspecified: Secondary | ICD-10-CM | POA: Diagnosis not present

## 2019-06-18 DIAGNOSIS — Z841 Family history of disorders of kidney and ureter: Secondary | ICD-10-CM | POA: Diagnosis not present

## 2019-06-18 DIAGNOSIS — M2550 Pain in unspecified joint: Secondary | ICD-10-CM

## 2019-06-18 DIAGNOSIS — I129 Hypertensive chronic kidney disease with stage 1 through stage 4 chronic kidney disease, or unspecified chronic kidney disease: Secondary | ICD-10-CM | POA: Diagnosis not present

## 2019-06-18 DIAGNOSIS — E1122 Type 2 diabetes mellitus with diabetic chronic kidney disease: Secondary | ICD-10-CM | POA: Diagnosis not present

## 2019-06-18 DIAGNOSIS — Z809 Family history of malignant neoplasm, unspecified: Secondary | ICD-10-CM

## 2019-06-18 DIAGNOSIS — Z87891 Personal history of nicotine dependence: Secondary | ICD-10-CM

## 2019-06-18 DIAGNOSIS — D631 Anemia in chronic kidney disease: Secondary | ICD-10-CM | POA: Diagnosis not present

## 2019-06-18 DIAGNOSIS — M549 Dorsalgia, unspecified: Secondary | ICD-10-CM

## 2019-06-18 DIAGNOSIS — R5383 Other fatigue: Secondary | ICD-10-CM | POA: Diagnosis not present

## 2019-06-18 DIAGNOSIS — E611 Iron deficiency: Secondary | ICD-10-CM | POA: Diagnosis not present

## 2019-06-18 DIAGNOSIS — M25552 Pain in left hip: Secondary | ICD-10-CM | POA: Diagnosis not present

## 2019-06-18 DIAGNOSIS — M25551 Pain in right hip: Secondary | ICD-10-CM

## 2019-06-18 DIAGNOSIS — R0602 Shortness of breath: Secondary | ICD-10-CM

## 2019-06-18 DIAGNOSIS — M1990 Unspecified osteoarthritis, unspecified site: Secondary | ICD-10-CM

## 2019-06-18 DIAGNOSIS — N183 Chronic kidney disease, stage 3 (moderate): Secondary | ICD-10-CM

## 2019-06-18 DIAGNOSIS — K5900 Constipation, unspecified: Secondary | ICD-10-CM

## 2019-06-18 DIAGNOSIS — Z833 Family history of diabetes mellitus: Secondary | ICD-10-CM

## 2019-06-18 DIAGNOSIS — Z8249 Family history of ischemic heart disease and other diseases of the circulatory system: Secondary | ICD-10-CM | POA: Diagnosis not present

## 2019-06-18 DIAGNOSIS — D508 Other iron deficiency anemias: Secondary | ICD-10-CM

## 2019-06-18 NOTE — Patient Instructions (Signed)
[facility] at [facility] Discharge Instructions  Follow up in 3 months with labs    Thank you for choosing [facility] at [facility] to provide your oncology and hematology care.  To afford each patient quality time with our provider, please arrive at least 15 minutes before your scheduled appointment time.   If you have a lab appointment with the Cancer Center please come in thru the  Main Entrance and check in at the main information desk  You need to re-schedule your appointment should you arrive 10 or more minutes late.  We strive to give you quality time with our providers, and arriving late affects you and other patients whose appointments are after yours.  Also, if you no show three or more times for appointments you may be dismissed from the clinic at the providers discretion.     Again, thank you for choosing [facility].  Our hope is that these requests will decrease the amount of time that you wait before being seen by our physicians.       _____________________________________________________________  Should you have questions after your visit to [facility], please contact our office at [phone] between the hours of 8:00 a.m. and 4:30 p.m.  Voicemails left after 4:00 p.m. will not be returned until the following business day.  For prescription refill requests, have your pharmacy contact our office and allow 72 hours.    Cancer Center Support Programs:   > Cancer Support Group  2nd Tuesday of the month 1pm-2pm, Journey Room    

## 2019-06-18 NOTE — Assessment & Plan Note (Addendum)
1.  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 - Last colonoscopy on 02/26/2019 showed normal terminal ileum, 2 tubular adenomas in the proximal ascending colon removed, diverticulosis in the ascending, descending, sigmoid and rectosigmoid colon, external and internal hemorrhoids. -Patient denies any bright red bleeding per rectum or melena. -Labs done on 06/10/2019 showed her hemoglobin 9.9, ferritin 61, percent saturation 18, creatinine 1.04 - We will see her back in 3 months with repeat blood work.  2.  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  However immunofixation in March and November 2018 was positive for IgA kappa. -Free light chain ratio is normal, normal calcium.   -No other intervention necessary at this time. -Labs done on 06/10/2019 showed no M spike.  Light chain ratio 1.52. - He is having pain in her hips and back.  Have ordered a skeletal survey prior to her next visit. -We will see her back in 3 months.

## 2019-06-18 NOTE — Progress Notes (Signed)
[place], [place] [address]   CLINIC:  Medical Oncology/Hematology  PCP:  Fayrene Helper, MD [address] [place] [address] [phone]   REASON FOR VISIT: Follow-up for iron deficiency anemia and MGUS  CURRENT THERAPY: Intermittent iron infusions and observation   INTERVAL HISTORY:  [name] 78 y.o. female returns for routine follow-up for iron deficiency anemia and MGUS.  [name] reports [name] is having more pain in [name] back and hips.  [name] denies any bright red bleeding per rectum or melena.  [name] is more fatigued during the day. Denies any nausea, vomiting, or diarrhea. Denies any new pains. Had not noticed any recent bleeding such as epistaxis, hematuria or hematochezia. Denies recent chest pain on exertion, shortness of breath on minimal exertion, pre-syncopal episodes, or palpitations. Denies any numbness or tingling in hands or feet. Denies any recent fevers, infections, or recent hospitalizations. Patient reports appetite at 25% and energy level at 25%.  [name] is eating well maintaining [name] weight at this time.    REVIEW OF SYSTEMS:  Review of Systems  Constitutional: Positive for fatigue.  Respiratory: Positive for shortness of breath.   Cardiovascular: Positive for leg swelling.  Gastrointestinal: Positive for constipation.  Musculoskeletal: Positive for arthralgias.  All other systems reviewed and are negative.    PAST MEDICAL/SURGICAL HISTORY:  Past Medical History:  Diagnosis Date  . Anemia in chronic renal disease 12/30/2015  . Anxiety   . Arthritis    "all over" (01/19/2014)  . Arthritis, lumbar spine   . Carpal tunnel syndrome   . Chronic bronchitis ([facility])    "got it q yr for awhile; hasn't had it in awhile" (01/19/2014)  . Chronic kidney disease (CKD) stage G3b/A1, moderately decreased glomerular filtration rate (GFR) between 30-44 mL/min/1.73 square meter and albuminuria creatinine ratio less than 30  mg/g (HCC) 09/14/2009   Qualifier: Diagnosis of  By: Moshe Cipro MD, Joycelyn Schmid    . Chronic renal disease, stage 3, moderately decreased glomerular filtration rate (GFR) between 30-59 mL/min/1.73 square meter (HCC) 09/14/2009   Qualifier: Diagnosis of  By: Moshe Cipro MD, Joycelyn Schmid    . Complication of anesthesia    combative  . COPD (chronic obstructive pulmonary disease) ([place])   . Depression   . Diverticulosis 07/2004   Colonscopy Dr Gala Romney  . Esophagitis, erosive 2009  . Exertional shortness of breath   . Gastroesophageal reflux   . PPIRJJOA(416.6)    "usually a couple times/wk" (01/19/2014)  . Heart murmur    saw cardiology In [place], he told [name] [name] did not need to come back.  . Hyperlipidemia   . Hypertension   . Impingement syndrome, shoulder   . Iron deficiency anemia   . Mitral regurgitation   . Myasthenia gravis   . Myasthenia gravis in remission ([facility]) 11/24/2014  . Myasthenia gravis in remission ([facility])   . Obesity   . OSA on CPAP    Negative on last sleep study  . Pneumonia 01/2012  . Pulmonary HTN ([facility])   . Schatzki's ring    Last EGD w/ dilation 02/08/11, 2009 & 2007  . Seasonal allergies   . Shoulder pain   . Thyroid disease    "used to take RX; they took me off it" (01/19/2014)  . Tobacco abuse   . Type II diabetes mellitus ([facility])    Past Surgical History:  Procedure Laterality Date  . [place] VITRECTOMY WITH 20 GAUGE MVR PORT Left  01/19/2014   Procedure: 25 GAUGE PARS PLANA VITRECTOMY WITH 20 GAUGE MVR PORT; MEMBRAME PEEL; SERUM PATCH; LASER TREATMENT; C3F8;  Surgeon: Hayden Pedro, MD;  Location: [facility];  Service: Ophthalmology;  Laterality: Left;  . ABDOMINAL HYSTERECTOMY    . APPENDECTOMY    . CARPAL TUNNEL RELEASE Bilateral   . CATARACT EXTRACTION W/PHACO Left 10/21/2013   Procedure: LEFT CATARACT EXTRACTION PHACO AND INTRAOCULAR LENS PLACEMENT (IOC);  Surgeon: Marylynn Pearson, MD;  Location: [facility];  Service: Ophthalmology;  Laterality: Left;  .  CHOLECYSTECTOMY    . COLONOSCOPY  05/08/2012   MAU:[id number] and external hemorrhoids; colonic diverticulosis  . COLONOSCOPY WITH PROPOFOL N/A 02/26/2019   Procedure: COLONOSCOPY WITH PROPOFOL;  Surgeon: Danie Binder, MD;  [facility];  Service: Endoscopy;  Laterality: N/A;  9:00am  . ESOPHAGEAL DILATION     "more than 3 times" (01/19/2014)  . ESOPHAGOGASTRODUODENOSCOPY  02/08/11   Rourk-Distal esophageal erosion consistent with mild erosive reflux   esophagitis/ Noncritical Schatzki ring, small hiatal hernia otherwise upper/ gastrointestinal tract appeared unremarkable, status post passage  of a Maloney dilation to biopsy disruption of the ring described  . ESOPHAGOGASTRODUODENOSCOPY  04/2012   2 tandem incomplete distal esophagea rings s/p dilation.   . ESOPHAGOGASTRODUODENOSCOPY N/A 09/16/2014   Dr. Gala Romney: Schatzki ring status post dilation/disruption.  Hiatal hernia.  Marland Kitchen ESOPHAGOGASTRODUODENOSCOPY (EGD) WITH PROPOFOL N/A 12/08/2018   EGD with mild Schatzki ring s/p dilation, small hiatal hernia, otherwise normal  . EYE SURGERY     cataracts, bilateral  . INCONTINENCE SURGERY  08/26/09   [facility]  . JOINT REPLACEMENT     right total knee  . MALONEY DILATION N/A 09/16/2014   Procedure: Venia Minks DILATION;  Surgeon: Daneil Dolin, MD;  [facility];  Service: Endoscopy;  Laterality: N/A;  . Venia Minks DILATION N/A 12/08/2018   Procedure: Venia Minks DILATION;  Surgeon: Daneil Dolin, MD;  [facility];  Service: Endoscopy;  Laterality: N/A;  . PARS PLANA VITRECTOMY W/ REPAIR OF MACULAR HOLE Left 01/19/2014  . POLYPECTOMY  02/26/2019   Procedure: POLYPECTOMY;  Surgeon: Danie Binder, MD;  [facility];  Service: Endoscopy;;  ascending colon polyps x2  . SAVORY DILATION N/A 09/16/2014   Procedure: SAVORY DILATION;  Surgeon: Daneil Dolin, MD;  [facility];  Service: Endoscopy;  Laterality: N/A;  . TONSILLECTOMY    . TOTAL KNEE ARTHROPLASTY Right  05/13/07   Dr. Aline Brochure  . TRIGGER FINGER RELEASE Right 06/26/2018   Procedure: RIGHT INDEX FINGER TRIGGER FINGER/A-1 PULLEY RELEASE;  Surgeon: Carole Civil, MD;  [facility]: [facility];  Service: Orthopedics;  Laterality: Right;     SOCIAL HISTORY:  Social History   Socioeconomic History  . Marital status: Single    Spouse name: Not on file  . Number of children: 2  . Years of education: Not on file  . Highest education level: Not on file  Occupational History  . Occupation: disabled    Employer: RETIRED  Social Needs  . Financial resource strain: Not very hard  . Food insecurity    Worry: Never true    Inability: Never true  . Transportation needs    Medical: Yes    Non-medical: No  Tobacco Use  . Smoking status: Former Smoker    Packs/day: 0.50    Years: 25.00    Pack years: 12.50    Types: Cigarettes    Quit date: 01/01/2004    Years since quitting:  15.4  . Smokeless tobacco: Never Used  Substance and Sexual Activity  . Alcohol use: No  . Drug use: No  . Sexual activity: Never    Birth control/protection: Surgical  Lifestyle  . Physical activity    Days per week: 0 days    Minutes per session: 0 min  . Stress: Only a little  Relationships  . Social Herbalist on phone: More than three times a week    Gets together: Never    Attends religious service: Never    Active member of club or organization: No    Attends meetings of clubs or organizations: Never    Relationship status: Never married  . Intimate partner violence    Fear of current or ex partner: No    Emotionally abused: No    Physically abused: No    Forced sexual activity: No  Other Topics Concern  . Not on file  Social History Narrative   Patient lives at home by herself   Patient drinks one cup of coffee a day   Patient is right handed.    FAMILY HISTORY:  Family History  Problem Relation Age of Onset  . Cancer Mother   . Heart disease Mother   . Cancer Father   . Heart  disease Father   . Diabetes Sister   . Hypertension Brother   . Heart disease Brother   . Cancer Sister   . Kidney failure Sister   . Diabetes Son   . Hypertension Son   . Hypertension Daughter   . Colon cancer Neg Hx     CURRENT MEDICATIONS:  Outpatient Encounter Medications as of 06/18/2019  Medication Sig  . amLODipine (NORVASC) 10 MG tablet TAKE 1 TABLET BY MOUTH ONCE A DAY.  Marland Kitchen B-D ULTRAFINE III SHORT PEN 31G X 8 MM MISC USE ONCE DAILY WITH LANTUS SOLOSTAR PEN.  . brimonidine-timolol (COMBIGAN) 0.2-0.5 % ophthalmic solution Place 1 drop into both eyes every 12 (twelve) hours.  . cetirizine (ZYRTEC) 10 MG tablet TAKE 1 TABLET BY MOUTH ONCE A DAY.  Marland Kitchen Cholecalciferol (VITAMIN D3) 2000 units TABS Take 2,000 Units by mouth at bedtime.  . citalopram (CELEXA) 20 MG tablet TAKE 1 TABLET BY MOUTH ONCE A DAY.  Marland Kitchen Cod Liver Oil CAPS Take by mouth. 500 MG per patient  . Cranberry 1000 MG CAPS Take 1,000 mg by mouth daily.  . cycloSPORINE (RESTASIS) 0.05 % ophthalmic emulsion Place 1 drop into both eyes 2 (two) times daily.   Marland Kitchen diltiazem (CARDIZEM CD) 240 MG 24 hr capsule TAKE (1) CAPSULE BY MOUTH TWICE DAILY.  Marland Kitchen docusate sodium (COLACE) 100 MG capsule Take 100 mg by mouth at bedtime.  Marland Kitchen ezetimibe (ZETIA) 10 MG tablet TAKE 1 TABLET BY MOUTH ONCE A DAY.  Marland Kitchen Flaxseed, Linseed, (FLAXSEED OIL) 1000 MG CAPS Take 1,000 mg by mouth daily.  . fluticasone (FLONASE) 50 MCG/ACT nasal spray PLACE 2 SPRAYS INTO BOTH NOSTRILS DAILY.  Marland Kitchen gabapentin (NEURONTIN) 300 MG capsule Take 1 capsule (300 mg total) by mouth 2 (two) times daily.  . hydrALAZINE (APRESOLINE) 50 MG tablet Take 1 tablet (50 mg total) by mouth 3 (three) times daily.  Marland Kitchen LANTUS SOLOSTAR 100 UNIT/ML Solostar Pen INJECT 30 UNITS SUBCUTANEOUSLY ONCE DAILY. (Patient taking differently: Inject 30 Units into the skin daily. )  . losartan (COZAAR) 100 MG tablet TAKE (1) TABLET BY MOUTH DAILY.  . Multiple Vitamin (MULTIVITAMIN WITH MINERALS) TABS tablet  Take 1 tablet by mouth  at bedtime.  Glory Rosebush DELICA LANCETS 53I MISC USE AS DIRECTED 3 TIMES DAILY FOR BLOOD GLUCOSE TESTING.  Marland Kitchen PAIN RELIEF EXTRA STRENGTH 500 MG tablet TAKE (1) TABLET BY MOUTH AT BEDTIME.  . pantoprazole (PROTONIX) 40 MG tablet TAKE 1 TABLET BY MOUTH TWICE DAILY BEFORE A MEAL.  Marland Kitchen polyethylene glycol powder (GLYCOLAX/MIRALAX) powder MIX 17G (1 CAPFUL) IN 8 OUNCES OF JUICE OR WATER AND DRINK ONCE DAILY.  Marland Kitchen predniSONE (DELTASONE) 5 MG tablet Take 1 tablet (5 mg total) by mouth daily.  Marland Kitchen PROAIR HFA 108 (90 Base) MCG/ACT inhaler INHALE 2 PUFFS BY MOUTH EVERY 6 HOURS AS NEEDED . (Patient taking differently: Inhale 2 puffs into the lungs every 6 (six) hours as needed for wheezing or shortness of breath. )  . pyridostigmine (MESTINON) 60 MG tablet TAKE (1) TABLET BY MOUTH THREE TIMES A DAY.  . rosuvastatin (CRESTOR) 40 MG tablet TAKE (1) TABLET BY MOUTH AT BEDTIME.  Marland Kitchen solifenacin (VESICARE) 10 MG tablet TAKE 1 TABLET BY MOUTH ONCE A DAY.  Marland Kitchen spironolactone (ALDACTONE) 25 MG tablet TAKE 1 TABLET BY MOUTH ONCE A DAY.  . TRADJENTA 5 MG TABS tablet TAKE 1 TABLET BY MOUTH ONCE A DAY.  Marland Kitchen UNABLE TO FIND Diabetic shoes x 1 Inserts x 3 DX E11.9  . venlafaxine XR (EFFEXOR-XR) 150 MG 24 hr capsule TAKE 1 CAPSULE BY MOUTH DAILY.  Marland Kitchen ondansetron (ZOFRAN) 4 MG tablet TAKE (1) TABLET BY MOUTH EVERY FOUR HOURS AS NEEDED FOR NAUSEA. (Patient not taking: TAKE (1) TABLET BY MOUTH EVERY FOUR HOURS AS NEEDED FOR NAUSEA.)   No facility-administered encounter medications on file as of 06/18/2019.     ALLERGIES:  No Known Allergies   PHYSICAL EXAM:  ECOG Performance status: 1  Vitals:   06/18/19 1100  BP: 133/81  Pulse: 62  Resp: 16  Temp: 98.9 F (37.2 C)  SpO2: 99%   Filed Weights   06/18/19 1100  Weight: 258 lb 3 oz (117.1 kg)    Physical Exam Constitutional:      Appearance: Normal appearance. [name] is normal weight.  Cardiovascular:     Rate and Rhythm: Normal rate and regular  rhythm.     Heart sounds: Normal heart sounds.  Pulmonary:     Effort: Pulmonary effort is normal.     Breath sounds: Normal breath sounds.  Abdominal:     General: Bowel sounds are normal.     Palpations: Abdomen is soft.  Musculoskeletal: Normal range of motion.  Skin:    General: Skin is warm and dry.  Neurological:     Mental Status: [name] is alert and oriented to person, place, and time. Mental status is at baseline.  Psychiatric:        Mood and Affect: Mood normal.        Behavior: Behavior normal.        Thought Content: Thought content normal.        Judgment: Judgment normal.      LABORATORY DATA:  I have reviewed the labs as listed.  CBC    Component Value Date/Time   WBC 8.5 06/10/2019 1336   RBC 3.49 (L) 06/10/2019 1336   HGB 9.9 (L) 06/10/2019 1336   HCT 32.7 (L) 06/10/2019 1336   PLT 263 06/10/2019 1336   MCV 93.7 06/10/2019 1336   MCH 28.4 06/10/2019 1336   MCHC 30.3 06/10/2019 1336   RDW 15.8 (H) 06/10/2019 1336   LYMPHSABS 1.5 06/10/2019 1336   MONOABS 0.8 06/10/2019 1336  EOSABS 0.2 06/10/2019 1336   BASOSABS 0.0 06/10/2019 1336   CMP Latest Ref Rng & Units 06/10/2019 03/03/2019 01/07/2019  Glucose 70 - 99 mg/dL 159(H) 102(H) 169(H)  BUN 8 - 23 mg/dL _0 Creatinine 0.44 - 1.00 mg/dL 1.04(H) 0.94 0.87  Sodium 135 - 145 mmol/L 137 141 139  Potassium 3.5 - 5.1 mmol/L 3.7 4.0 3.6  Chloride 98 - 111 mmol/L 108 106 105  CO2 22 - 32 mmol/L 20(L) 28 27  Calcium 8.9 - 10.3 mg/dL 8.6(L) 9.2 8.7(L)  Total Protein 6.5 - 8.1 g/dL 7.0 7.3 7.0  Total Bilirubin 0.3 - 1.2 mg/dL 0.2(L) 0.5 0.8  Alkaline Phos 38 - 126 U/L 133(H) 135(H) 139(H)  AST 15 - 41 U/L _1 ALT 0 - 44 U/L _2 I personally performed a face-to-face visit.  All questions were answered to patient's stated satisfaction. Encouraged patient to call with any new concerns or questions before his next visit to the cancer center and we can certain see him sooner, if needed.      ASSESSMENT & PLAN:   Iron deficiency anemia 1.  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 - Last colonoscopy on 02/26/2019 showed normal terminal ileum, 2 tubular adenomas in the proximal ascending colon removed, diverticulosis in the ascending, descending, sigmoid and rectosigmoid colon, external and internal hemorrhoids. -Patient denies any bright red bleeding per rectum or melena. -Labs done on 06/10/2019 showed [name] hemoglobin 9.9, ferritin 61, percent saturation 18, creatinine 1.04 - We will see [name] back in 3 months with repeat blood work.  2.  IgA kappa MGUS: - [name] had a SPEP which was negative for M spike.  However immunofixation in March and November 2018 was positive for IgA kappa. -Free light chain ratio is normal, normal calcium.   -No other intervention necessary at this time. -Labs done on 06/10/2019 showed no M spike.  Light chain ratio 1.52. - He is having pain in [name] hips and back.  Have ordered a skeletal survey prior to [name] next visit. -We will see [name] back in 3 months.      Orders placed this encounter:  Orders Placed This Encounter  Procedures  . DG Bone Survey Met  . Lactate dehydrogenase  . Protein electrophoresis, serum  . Immunofixation electrophoresis  . Kappa/lambda light chains  . CBC with Differential/Platelet  . Comprehensive metabolic panel  . Ferritin  . Iron and TIBC  . VITAMIN D 25 Hydroxy (Vit-D Deficiency, Fractures)  . Vitamin B12  . Folate  . IgG, IgA, IgM      Natale Lay, FNP-C [facility] [phone]

## 2019-06-19 ENCOUNTER — Other Ambulatory Visit: Payer: Self-pay | Admitting: Orthopedic Surgery

## 2019-06-24 ENCOUNTER — Ambulatory Visit (HOSPITAL_COMMUNITY)
Admission: RE | Admit: 2019-06-24 | Discharge: 2019-06-24 | Disposition: A | Discharge status: Home / Self Care | Payer: Medicare Other | Source: Ambulatory Visit | Attending: Family Medicine | Admitting: Family Medicine

## 2019-06-24 ENCOUNTER — Other Ambulatory Visit: Payer: Self-pay

## 2019-06-24 ENCOUNTER — Inpatient Hospital Stay (HOSPITAL_COMMUNITY): Payer: Medicare Other

## 2019-06-24 ENCOUNTER — Other Ambulatory Visit: Payer: Self-pay | Admitting: Family Medicine

## 2019-06-24 ENCOUNTER — Ambulatory Visit (HOSPITAL_COMMUNITY): Payer: Medicare Other

## 2019-06-24 ENCOUNTER — Other Ambulatory Visit (HOSPITAL_COMMUNITY): Payer: Self-pay | Admitting: Family Medicine

## 2019-06-24 VITALS — BP 158/54 | HR 57 | Temp 98.4°F | Resp 16

## 2019-06-24 DIAGNOSIS — D472 Monoclonal gammopathy: Secondary | ICD-10-CM | POA: Diagnosis not present

## 2019-06-24 DIAGNOSIS — Z87891 Personal history of nicotine dependence: Secondary | ICD-10-CM | POA: Diagnosis not present

## 2019-06-24 DIAGNOSIS — Z841 Family history of disorders of kidney and ureter: Secondary | ICD-10-CM | POA: Diagnosis not present

## 2019-06-24 DIAGNOSIS — Z833 Family history of diabetes mellitus: Secondary | ICD-10-CM | POA: Diagnosis not present

## 2019-06-24 DIAGNOSIS — D631 Anemia in chronic kidney disease: Secondary | ICD-10-CM | POA: Diagnosis not present

## 2019-06-24 DIAGNOSIS — Z1231 Encounter for screening mammogram for malignant neoplasm of breast: Secondary | ICD-10-CM | POA: Diagnosis not present

## 2019-06-24 DIAGNOSIS — M25552 Pain in left hip: Secondary | ICD-10-CM | POA: Diagnosis not present

## 2019-06-24 DIAGNOSIS — Z79899 Other long term (current) drug therapy: Secondary | ICD-10-CM | POA: Diagnosis not present

## 2019-06-24 DIAGNOSIS — Z8249 Family history of ischemic heart disease and other diseases of the circulatory system: Secondary | ICD-10-CM | POA: Diagnosis not present

## 2019-06-24 DIAGNOSIS — M549 Dorsalgia, unspecified: Secondary | ICD-10-CM | POA: Diagnosis not present

## 2019-06-24 DIAGNOSIS — M25551 Pain in right hip: Secondary | ICD-10-CM | POA: Diagnosis not present

## 2019-06-24 DIAGNOSIS — I129 Hypertensive chronic kidney disease with stage 1 through stage 4 chronic kidney disease, or unspecified chronic kidney disease: Secondary | ICD-10-CM | POA: Diagnosis not present

## 2019-06-24 DIAGNOSIS — D508 Other iron deficiency anemias: Secondary | ICD-10-CM

## 2019-06-24 DIAGNOSIS — R5383 Other fatigue: Secondary | ICD-10-CM | POA: Diagnosis not present

## 2019-06-24 DIAGNOSIS — N183 Chronic kidney disease, stage 3 (moderate): Secondary | ICD-10-CM | POA: Diagnosis not present

## 2019-06-24 DIAGNOSIS — Z809 Family history of malignant neoplasm, unspecified: Secondary | ICD-10-CM | POA: Diagnosis not present

## 2019-06-24 DIAGNOSIS — R0602 Shortness of breath: Secondary | ICD-10-CM | POA: Diagnosis not present

## 2019-06-24 DIAGNOSIS — M7989 Other specified soft tissue disorders: Secondary | ICD-10-CM | POA: Diagnosis not present

## 2019-06-24 DIAGNOSIS — M2550 Pain in unspecified joint: Secondary | ICD-10-CM | POA: Diagnosis not present

## 2019-06-24 DIAGNOSIS — R928 Other abnormal and inconclusive findings on diagnostic imaging of breast: Secondary | ICD-10-CM

## 2019-06-24 DIAGNOSIS — E1122 Type 2 diabetes mellitus with diabetic chronic kidney disease: Secondary | ICD-10-CM | POA: Diagnosis not present

## 2019-06-24 DIAGNOSIS — E611 Iron deficiency: Secondary | ICD-10-CM | POA: Diagnosis not present

## 2019-06-24 DIAGNOSIS — E039 Hypothyroidism, unspecified: Secondary | ICD-10-CM | POA: Diagnosis not present

## 2019-06-24 DIAGNOSIS — K5900 Constipation, unspecified: Secondary | ICD-10-CM | POA: Diagnosis not present

## 2019-06-24 DIAGNOSIS — M1990 Unspecified osteoarthritis, unspecified site: Secondary | ICD-10-CM | POA: Diagnosis not present

## 2019-06-24 MED ORDER — SODIUM CHLORIDE 0.9 % IV SOLN
INTRAVENOUS | Status: DC
  Administered 2019-06-24: 15:00:00 via INTRAVENOUS

## 2019-06-24 MED ORDER — SODIUM CHLORIDE 0.9 % IV SOLN
510.0000 mg | Freq: Once | INTRAVENOUS | Status: AC
  Administered 2019-06-24: 510 mg via INTRAVENOUS
  Filled 2019-06-24: qty 510

## 2019-06-24 NOTE — Progress Notes (Signed)
Pt presents today for Feraheme. VSS. No complaints of any changes since the last visit.   Feraheme given today per MD orders. Tolerated infusion without adverse affects. Vital signs stable. No complaints at this time. Discharged from clinic ambulatory. F/U with [facility] as scheduled.

## 2019-06-30 ENCOUNTER — Other Ambulatory Visit: Payer: Self-pay

## 2019-06-30 ENCOUNTER — Encounter: Payer: Self-pay | Admitting: Family Medicine

## 2019-06-30 ENCOUNTER — Ambulatory Visit (INDEPENDENT_AMBULATORY_CARE_PROVIDER_SITE_OTHER): Payer: Medicare Other | Admitting: Family Medicine

## 2019-06-30 VITALS — BP 140/68 | HR 83 | Temp 98.0°F | Resp 15 | Ht 66.0 in | Wt 260.0 lb

## 2019-06-30 DIAGNOSIS — M5440 Lumbago with sciatica, unspecified side: Secondary | ICD-10-CM

## 2019-06-30 DIAGNOSIS — E119 Type 2 diabetes mellitus without complications: Secondary | ICD-10-CM

## 2019-06-30 DIAGNOSIS — G8929 Other chronic pain: Secondary | ICD-10-CM | POA: Diagnosis not present

## 2019-06-30 DIAGNOSIS — Z794 Long term (current) use of insulin: Secondary | ICD-10-CM | POA: Diagnosis not present

## 2019-06-30 DIAGNOSIS — E785 Hyperlipidemia, unspecified: Secondary | ICD-10-CM | POA: Diagnosis not present

## 2019-06-30 DIAGNOSIS — I10 Essential (primary) hypertension: Secondary | ICD-10-CM

## 2019-06-30 DIAGNOSIS — IMO0001 Reserved for inherently not codable concepts without codable children: Secondary | ICD-10-CM

## 2019-06-30 MED ORDER — HYDROCODONE-ACETAMINOPHEN 10-325 MG PO TABS: ORAL_TABLET | ORAL | 0 refills | Status: DC

## 2019-06-30 MED ORDER — HYDROCODONE-ACETAMINOPHEN 10-325 MG PO TABS: ORAL_TABLET | ORAL | 0 refills | Status: AC

## 2019-06-30 MED ORDER — KETOROLAC TROMETHAMINE 60 MG/2ML IM SOLN
60.0000 mg | Freq: Once | INTRAMUSCULAR | Status: AC
  Administered 2019-06-30: 60 mg via INTRAMUSCULAR

## 2019-06-30 MED ORDER — METHYLPREDNISOLONE ACETATE 80 MG/ML IJ SUSP
80.0000 mg | Freq: Once | INTRAMUSCULAR | Status: AC
  Administered 2019-06-30: 80 mg via INTRAMUSCULAR

## 2019-06-30 NOTE — Assessment & Plan Note (Signed)
The patient's Controlled Substance registry is reviewed and compliance confirmed. Adequacy of  Pain control and level of function is assessed. Medication dosing is adjusted as deemed appropriate. Twelve weeks of medication is prescribed , patient signs for the script and is provided with a follow up appointment between 11 to 12 weeks .  

## 2019-06-30 NOTE — Patient Instructions (Addendum)
F/U week of Sept  22 with MD for chronic pain management with MD, call if you need me sooner  Toradol 60 mg IM and Depo Medrol 80 mg IM in office today for back and knee pain  Labs next week please fast lipid,  and hBA1C   Thanks for choosing [facility], we consider it a privelige to serve you.   Social distancing.Avoid crowds Frequent hand washing with soap and water Keeping your hands off of your face.Wear a mask in public These 3 practices will help to keep both you and your community healthy during this time. Please practice them faithfully!

## 2019-07-01 ENCOUNTER — Encounter (HOSPITAL_COMMUNITY): Payer: Self-pay

## 2019-07-01 ENCOUNTER — Inpatient Hospital Stay (HOSPITAL_COMMUNITY): Payer: Medicare Other | Attending: Hematology

## 2019-07-01 VITALS — BP 181/71 | HR 55 | Temp 97.6°F | Resp 18

## 2019-07-01 DIAGNOSIS — M549 Dorsalgia, unspecified: Secondary | ICD-10-CM | POA: Diagnosis not present

## 2019-07-01 DIAGNOSIS — E119 Type 2 diabetes mellitus without complications: Secondary | ICD-10-CM | POA: Insufficient documentation

## 2019-07-01 DIAGNOSIS — Z841 Family history of disorders of kidney and ureter: Secondary | ICD-10-CM | POA: Diagnosis not present

## 2019-07-01 DIAGNOSIS — E611 Iron deficiency: Secondary | ICD-10-CM | POA: Insufficient documentation

## 2019-07-01 DIAGNOSIS — M2550 Pain in unspecified joint: Secondary | ICD-10-CM | POA: Diagnosis not present

## 2019-07-01 DIAGNOSIS — Z809 Family history of malignant neoplasm, unspecified: Secondary | ICD-10-CM | POA: Insufficient documentation

## 2019-07-01 DIAGNOSIS — K5900 Constipation, unspecified: Secondary | ICD-10-CM | POA: Insufficient documentation

## 2019-07-01 DIAGNOSIS — Z87891 Personal history of nicotine dependence: Secondary | ICD-10-CM | POA: Insufficient documentation

## 2019-07-01 DIAGNOSIS — I10 Essential (primary) hypertension: Secondary | ICD-10-CM | POA: Diagnosis not present

## 2019-07-01 DIAGNOSIS — M7989 Other specified soft tissue disorders: Secondary | ICD-10-CM | POA: Insufficient documentation

## 2019-07-01 DIAGNOSIS — Z8249 Family history of ischemic heart disease and other diseases of the circulatory system: Secondary | ICD-10-CM | POA: Diagnosis not present

## 2019-07-01 DIAGNOSIS — M25559 Pain in unspecified hip: Secondary | ICD-10-CM | POA: Diagnosis not present

## 2019-07-01 DIAGNOSIS — Z79899 Other long term (current) drug therapy: Secondary | ICD-10-CM | POA: Insufficient documentation

## 2019-07-01 DIAGNOSIS — D472 Monoclonal gammopathy: Secondary | ICD-10-CM | POA: Diagnosis not present

## 2019-07-01 DIAGNOSIS — Z833 Family history of diabetes mellitus: Secondary | ICD-10-CM | POA: Insufficient documentation

## 2019-07-01 DIAGNOSIS — R5383 Other fatigue: Secondary | ICD-10-CM | POA: Diagnosis not present

## 2019-07-01 DIAGNOSIS — R0602 Shortness of breath: Secondary | ICD-10-CM | POA: Diagnosis not present

## 2019-07-01 DIAGNOSIS — D631 Anemia in chronic kidney disease: Secondary | ICD-10-CM | POA: Diagnosis not present

## 2019-07-01 DIAGNOSIS — N183 Chronic kidney disease, stage 3 (moderate): Secondary | ICD-10-CM | POA: Diagnosis not present

## 2019-07-01 DIAGNOSIS — D508 Other iron deficiency anemias: Secondary | ICD-10-CM

## 2019-07-01 DIAGNOSIS — E039 Hypothyroidism, unspecified: Secondary | ICD-10-CM | POA: Diagnosis not present

## 2019-07-01 MED ORDER — SODIUM CHLORIDE 0.9 % IV SOLN
510.0000 mg | Freq: Once | INTRAVENOUS | Status: AC
  Administered 2019-07-01: 510 mg via INTRAVENOUS
  Filled 2019-07-01: qty 510

## 2019-07-01 MED ORDER — SODIUM CHLORIDE 0.9 % IV SOLN
INTRAVENOUS | Status: DC
  Administered 2019-07-01: 15:00:00 via INTRAVENOUS

## 2019-07-01 NOTE — Progress Notes (Signed)
Pt presents today for Feraheme. BP elevated. Pt did not take her BP medication today. Pt denies any blurred vision, headache, or dizziness. Pt teaching performed pertaining to blood pressure medication. Pt verbalized an understanding. MAR reviewed. Pt denies any changes since the last visit.   Feraheme  given today per MD orders. Tolerated infusion without adverse affects. Vital signs stable. No complaints at this time. Discharged from clinic ambulatory. F/U with [facility] as scheduled.

## 2019-07-01 NOTE — Patient Instructions (Signed)
[facility]  Discharge Instructions:   _______________________________________________________________  Thank you for choosing  [facility] to provide your oncology and hematology care.  To afford each patient quality time with our providers, please arrive at least 15 minutes before your scheduled appointment.  You need to re-schedule your appointment if you arrive 10 or more minutes late.  We strive to give you quality time with our providers, and arriving late affects you and other patients whose appointments are after yours.  Also, if you no show three or more times for appointments you may be dismissed from the clinic.  Again, thank you for choosing  [facility] Center at [facility]. Our hope is that these requests will allow you access to exceptional care and in a timely manner. _______________________________________________________________  If you have questions after your visit, please contact our office at [phone] between the hours of 8:30 a.m. and 5:00 p.m. Voicemails left after 4:30 p.m. will not be returned until the following business day. _______________________________________________________________  For prescription refill requests, have your pharmacy contact our office. _______________________________________________________________  Recommendations made by the consultant and any test results will be sent to your referring physician. _______________________________________________________________ 

## 2019-07-02 ENCOUNTER — Other Ambulatory Visit: Payer: Self-pay | Admitting: Family Medicine

## 2019-07-05 ENCOUNTER — Encounter: Payer: Self-pay | Admitting: Family Medicine

## 2019-07-05 DIAGNOSIS — M549 Dorsalgia, unspecified: Secondary | ICD-10-CM | POA: Insufficient documentation

## 2019-07-05 NOTE — Assessment & Plan Note (Signed)
Hyperlipidemia:Low fat diet discussed and encouraged.   Lipid Panel  Lab Results  Component Value Date   CHOL 215 (H) 10/16/2018   HDL 51 10/16/2018   LDLCALC 145 (H) 10/16/2018   TRIG 89 10/16/2018   CHOLHDL 4.2 10/16/2018  Uncontrolled and not at goal, updated lab needed

## 2019-07-05 NOTE — Assessment & Plan Note (Addendum)
[name] is reminded of the importance of commitment to daily physical activity for 30 minutes or more, as able and the need to limit carbohydrate intake to 30 to 60 grams per meal to help with blood sugar control.   The need to take medication as prescribed, test blood sugar as directed, and to call between visits if there is a concern that blood sugar is uncontrolled is also discussed.   [name] is reminded of the importance of daily foot exam, annual eye examination, and good blood sugar, blood pressure and cholesterol control. Updated lab needed , past due  Diabetic Labs Latest Ref Rng & Units 06/10/2019 03/03/2019 01/07/2019 12/10/2018 11/03/2018  HbA1c <5.7 % of total Hgb - - - - -  Microalbumin mg/dL - - - - -  Micro/Creat Ratio <30 mcg/mg creat - - - - -  Chol <200 mg/dL - - - - -  HDL >50 mg/dL - - - - -  Calc LDL mg/dL (calc) - - - - -  Triglycerides <150 mg/dL - - - - -  Creatinine 0.44 - 1.00 mg/dL 1.04(H) 0.94 0.87 1.11(H) 0.94   BP/Weight 07/01/2019 06/30/2019 06/24/2019 06/18/2019 06/17/2019 6/72/0947 0/08/6282  Systolic BP 662 947 654 650 354 656 812  Diastolic BP 71 68 54 81 51 51 70  Wt. (Lbs) - 260 - 258.19 266 266 253  BMI - 41.97 - 41.67 42.93 42.93 40.84   Foot/eye exam completion dates Latest Ref Rng & Units 02/24/2019 07/17/2018  Eye Exam No Retinopathy Retinopathy(A) -  Foot Form Completion - - Done

## 2019-07-05 NOTE — Progress Notes (Signed)
[name]     MRN: [id number]      DOB: May 23, 1941   HPI Ms. Cimino is here for follow up and re-evaluation of chronic medical conditions, medication management and review of any available recent lab and radiology data.  Preventive health is updated, specifically  Cancer screening and Immunization.   Questions or concerns regarding consultations or procedures which the PT has had in the interim are  addressed. The PT denies any adverse reactions to current medications since the last visit.  There are no new concerns.   ROS Denies recent fever or chills. Denies sinus pressure, nasal congestion, ear pain or sore throat. Denies chest congestion, productive cough or wheezing. Denies chest pains, palpitations and leg swelling Denies abdominal pain, nausea, vomiting,diarrhea or constipation.   Denies dysuria, frequency, hesitancy or incontinence. Chronic  joint pain, swelling and limitation in mobility with current flare of back and knee pain Denies headaches, seizures, numbness, or tingling. Denies depression, anxiety or insomnia. Denies skin break down or rash.   PE  BP 140/68   Pulse 83   Temp 98 F (36.7 C) (Temporal)   Resp 15   Ht 5\' 6"  (1.676 m)   Wt 260 lb (117.9 kg)   SpO2 98%   BMI 41.97 kg/m   Patient alert and oriented and in no cardiopulmonary distress.  HEENT: No facial asymmetry, EOMI,   oropharynx pink and moist.  Neck supple no JVD, no mass.  Chest: Clear to auscultation bilaterally.  CVS: S1, S2 no murmurs, no S3.Regular rate.  ABD: Soft non tender.   Ext: No edema  HQ:PRFFMBWGY  ROM spine, shoulders, hips and knees.  Skin: Intact, no ulcerations or rash noted.  Psych: Good eye contact, normal affect. Memory intact not anxious or depressed appearing.  CNS: CN 2-12 intact, power,  normal throughout.no focal deficits noted.   Assessment & Plan  Encounter for chronic pain management The patient's Controlled Substance registry is reviewed  and compliance confirmed. Adequacy of  Pain control and level of function is assessed. Medication dosing is adjusted as deemed appropriate. Twelve weeks of medication is prescribed , patient signs for the script and is provided with a follow up appointment between 11 to 12 weeks .   Back pain Uncontrolled.Toradol and depo medrol administered IM in the office  DEGENERATIVE JOINT DISEASE, KNEE Uncontrolled.Toradol and depo medrol administered IM in the office   Essential hypertension, benign Elevated systolic, however , pt is in pain, no med change at visit DASH diet and commitment to daily physical activity for a minimum of 30 minutes discussed and encouraged, as a part of hypertension management. The importance of attaining a healthy weight is also discussed.  BP/Weight 07/01/2019 06/30/2019 06/24/2019 06/18/2019 06/17/2019 6/59/9357 0/01/7792  Systolic BP 903 009 233 007 622 633 354  Diastolic BP 71 68 54 81 51 51 70  Wt. (Lbs) - 260 - 258.19 266 266 253  BMI - 41.97 - 41.67 42.93 42.93 40.84       Hyperlipemia Hyperlipidemia:Low fat diet discussed and encouraged.   Lipid Panel  Lab Results  Component Value Date   CHOL 215 (H) 10/16/2018   HDL 51 10/16/2018   LDLCALC 145 (H) 10/16/2018   TRIG 89 10/16/2018   CHOLHDL 4.2 10/16/2018  Uncontrolled and not at goal, updated lab needed     Diabetes mellitus, insulin dependent (IDDM), controlled [facility]) Ms. Gonzalo is reminded of the importance of commitment to daily physical activity for 30 minutes or more,  as able and the need to limit carbohydrate intake to 30 to 60 grams per meal to help with blood sugar control.   The need to take medication as prescribed, test blood sugar as directed, and to call between visits if there is a concern that blood sugar is uncontrolled is also discussed.   Ms. Strieter is reminded of the importance of daily foot exam, annual eye examination, and good blood sugar, blood pressure and cholesterol  control. Updated lab needed , past due  Diabetic Labs Latest Ref Rng & Units 06/10/2019 03/03/2019 01/07/2019 12/10/2018 11/03/2018  HbA1c <5.7 % of total Hgb - - - - -  Microalbumin mg/dL - - - - -  Micro/Creat Ratio <30 mcg/mg creat - - - - -  Chol <200 mg/dL - - - - -  HDL >50 mg/dL - - - - -  Calc LDL mg/dL (calc) - - - - -  Triglycerides <150 mg/dL - - - - -  Creatinine 0.44 - 1.00 mg/dL 1.04(H) 0.94 0.87 1.11(H) 0.94   BP/Weight 07/01/2019 06/30/2019 06/24/2019 06/18/2019 06/17/2019 9/74/1638 04/04/3645  Systolic BP 803 212 248 250 037 048 889  Diastolic BP 71 68 54 81 51 51 70  Wt. (Lbs) - 260 - 258.19 266 266 253  BMI - 41.97 - 41.67 42.93 42.93 40.84   Foot/eye exam completion dates Latest Ref Rng & Units 02/24/2019 07/17/2018  Eye Exam No Retinopathy Retinopathy(A) -  Foot Form Completion - - Done        Morbid obesity  Patient re-educated about  the importance of commitment to a  minimum of 150 minutes of exercise per week as able.  The importance of healthy food choices with portion control discussed, as well as eating regularly and within a 12 hour window most days. The need to choose "clean , green" food 50 to 75% of the time is discussed, as well as to make water the primary drink and set a goal of 64 ounces water daily.    Weight /BMI 06/30/2019 06/18/2019 06/17/2019  WEIGHT 260 lb 258 lb 3 oz 266 lb  HEIGHT 5\' 6"  - 5\' 6"   BMI 41.97 kg/m2 41.67 kg/m2 42.93 kg/m2

## 2019-07-05 NOTE — Assessment & Plan Note (Signed)
  Patient re-educated about  the importance of commitment to a  minimum of 150 minutes of exercise per week as able.  The importance of healthy food choices with portion control discussed, as well as eating regularly and within a 12 hour window most days. The need to choose "clean , green" food 50 to 75% of the time is discussed, as well as to make water the primary drink and set a goal of 64 ounces water daily.    Weight /BMI 06/30/2019 06/18/2019 06/17/2019  WEIGHT 260 lb 258 lb 3 oz 266 lb  HEIGHT 5\' 6"  - 5\' 6"   BMI 41.97 kg/m2 41.67 kg/m2 42.93 kg/m2

## 2019-07-05 NOTE — Assessment & Plan Note (Signed)
Uncontrolled.Toradol and depo medrol administered IM in the office . 

## 2019-07-05 NOTE — Assessment & Plan Note (Signed)
Elevated systolic, however , pt is in pain, no med change at visit DASH diet and commitment to daily physical activity for a minimum of 30 minutes discussed and encouraged, as a part of hypertension management. The importance of attaining a healthy weight is also discussed.  BP/Weight 07/01/2019 06/30/2019 06/24/2019 06/18/2019 06/17/2019 7/85/8850 02/06/7411  Systolic BP 878 676 720 947 096 283 662  Diastolic BP 71 68 54 81 51 51 70  Wt. (Lbs) - 260 - 258.19 266 266 253  BMI - 41.97 - 41.67 42.93 42.93 40.84

## 2019-07-07 ENCOUNTER — Ambulatory Visit (HOSPITAL_COMMUNITY)
Admission: RE | Admit: 2019-07-07 | Discharge: 2019-07-07 | Disposition: A | Discharge status: Home / Self Care | Payer: Medicare Other | Source: Ambulatory Visit | Attending: Family Medicine | Admitting: Family Medicine

## 2019-07-07 ENCOUNTER — Other Ambulatory Visit: Payer: Self-pay

## 2019-07-07 ENCOUNTER — Ambulatory Visit (HOSPITAL_COMMUNITY): Payer: Medicare Other

## 2019-07-07 DIAGNOSIS — N6489 Other specified disorders of breast: Secondary | ICD-10-CM | POA: Diagnosis not present

## 2019-07-07 DIAGNOSIS — R928 Other abnormal and inconclusive findings on diagnostic imaging of breast: Secondary | ICD-10-CM | POA: Diagnosis not present

## 2019-07-07 DIAGNOSIS — R922 Inconclusive mammogram: Secondary | ICD-10-CM | POA: Diagnosis not present

## 2019-07-20 ENCOUNTER — Other Ambulatory Visit: Payer: Self-pay | Admitting: Family Medicine

## 2019-07-28 ENCOUNTER — Encounter: Payer: Self-pay | Admitting: Gastroenterology

## 2019-07-28 ENCOUNTER — Ambulatory Visit: Payer: Medicare Other | Admitting: Gastroenterology

## 2019-07-28 ENCOUNTER — Telehealth: Payer: Self-pay | Admitting: *Deleted

## 2019-07-28 ENCOUNTER — Other Ambulatory Visit: Payer: Self-pay

## 2019-07-28 ENCOUNTER — Other Ambulatory Visit: Payer: Self-pay | Admitting: *Deleted

## 2019-07-28 VITALS — BP 142/55 | HR 52 | Temp 96.6°F | Ht 66.0 in | Wt 263.8 lb

## 2019-07-28 DIAGNOSIS — D508 Other iron deficiency anemias: Secondary | ICD-10-CM

## 2019-07-28 DIAGNOSIS — D509 Iron deficiency anemia, unspecified: Secondary | ICD-10-CM

## 2019-07-28 DIAGNOSIS — Z794 Long term (current) use of insulin: Secondary | ICD-10-CM | POA: Diagnosis not present

## 2019-07-28 DIAGNOSIS — K5900 Constipation, unspecified: Secondary | ICD-10-CM | POA: Diagnosis not present

## 2019-07-28 DIAGNOSIS — E785 Hyperlipidemia, unspecified: Secondary | ICD-10-CM | POA: Diagnosis not present

## 2019-07-28 DIAGNOSIS — E119 Type 2 diabetes mellitus without complications: Secondary | ICD-10-CM | POA: Diagnosis not present

## 2019-07-28 NOTE — Assessment & Plan Note (Signed)
Not ideally managed. Will provide Linzess 72 mcg samples when available. For now, continue stool softeners and Miralax prn. 3-4 month return.

## 2019-07-28 NOTE — Patient Instructions (Signed)
We are arranging a capsule study to assess your small bowel.  For now, continue with the stool softener and Miralax as needed. We are out of samples for Linzess. When we get them, we will let you know.  We will see you in 3-4 months!  I enjoyed seeing you again today! As you know, I value our relationship and want to provide genuine, compassionate, and quality care. I welcome your feedback. If you receive a survey regarding your visit,  I greatly appreciate you taking time to fill this out. See you next time!  Annitta Needs, PhD, ANP-BC [facility]

## 2019-07-28 NOTE — Progress Notes (Signed)
Referring Provider: Fayrene Helper, MD Primary Care Physician:  Fayrene Helper, MD Primary GI: Dr. Gala Romney   Chief Complaint  Patient presents with  . pp f/u    doing ok    HPI:   [name] is a 78 y.o. female presenting today with a history of GERD, dysphagia, IDA, N/V, constipation. Recent EGD with mild Shatzki ring at [facility], s/p dilation. Small hiatal hernia, otherwise normal. Due to worsening IDA, she had an updated colonoscopy with two simple adenomas, diverticulosis,  and internal hemorrhoids.   Constipation: Started on Amitiza 24 mcg BID in January without improvement. Recommended Linzess 290 mcg. Feels like Linzess 290 mcg "tore my stomach up". Using stool softener daily, which helps. Miralax as needed. BM daily or every other day. Still feels she could use something stronger. Miralax will cause looser stool at times.   IDA: Still requiring iron infusions, with ferritin drifting. Feels tired all the time. Mild LUQ discomfort after eating at times. No melena or hematochezia. No NSAIDs.   Past Medical History:  Diagnosis Date  . Anemia in chronic renal disease 12/30/2015  . Anxiety   . Arthritis    "all over" (01/19/2014)  . Arthritis, lumbar spine   . Carpal tunnel syndrome   . Chronic bronchitis ([place])    "got it q yr for awhile; hasn't had it in awhile" (01/19/2014)  . Chronic kidney disease (CKD) stage G3b/A1, moderately decreased glomerular filtration rate (GFR) between 30-44 mL/min/1.73 square meter and albuminuria creatinine ratio less than 30 mg/g (HCC) 09/14/2009   Qualifier: Diagnosis of  By: Moshe Cipro MD, Joycelyn Schmid    . Chronic renal disease, stage 3, moderately decreased glomerular filtration rate (GFR) between 30-59 mL/min/1.73 square meter (HCC) 09/14/2009   Qualifier: Diagnosis of  By: Moshe Cipro MD, Joycelyn Schmid    . Complication of anesthesia    combative  . COPD (chronic obstructive pulmonary disease) ([place])   . Depression   . Diverticulosis  07/2004   Colonscopy Dr Gala Romney  . Esophagitis, erosive 2009  . Exertional shortness of breath   . Gastroesophageal reflux   . WIOMBTDH(741.6)    "usually a couple times/wk" (01/19/2014)  . Heart murmur    saw cardiology [place], he told her she did not need to come back.  . Hyperlipidemia   . Hypertension   . Impingement syndrome, shoulder   . Iron deficiency anemia   . Mitral regurgitation   . Myasthenia gravis   . Myasthenia gravis in remission ([place]) 11/24/2014  . Myasthenia gravis in remission ([place])   . Obesity   . OSA on CPAP    Negative on last sleep study  . Pneumonia 01/2012  . Pulmonary HTN ([place])   . Schatzki's ring    Last EGD w/ dilation 02/08/11, 2009 & 2007  . Seasonal allergies   . Shoulder pain   . Thyroid disease    "used to take RX; they took me off it" (01/19/2014)  . Tobacco abuse   . Type II diabetes mellitus ([place])     Past Surgical History:  Procedure Laterality Date  . [employer] VITRECTOMY WITH 20 GAUGE MVR PORT Left 01/19/2014   Procedure: 25 GAUGE PARS PLANA VITRECTOMY WITH 20 GAUGE MVR PORT; MEMBRAME PEEL; SERUM PATCH; LASER TREATMENT; C3F8;  Surgeon: Hayden Pedro, MD;  Location: [facility];  Service: Ophthalmology;  Laterality: Left;  . ABDOMINAL HYSTERECTOMY    . APPENDECTOMY    . CARPAL TUNNEL RELEASE  Bilateral   . CATARACT EXTRACTION W/PHACO Left 10/21/2013   Procedure: LEFT CATARACT EXTRACTION PHACO AND INTRAOCULAR LENS PLACEMENT (IOC);  Surgeon: Marylynn Pearson, MD;  Location: [facility];  Service: Ophthalmology;  Laterality: Left;  . CHOLECYSTECTOMY    . COLONOSCOPY  05/08/2012   YSA:YTKZSWFU and external hemorrhoids; colonic diverticulosis  . COLONOSCOPY WITH PROPOFOL N/A 02/26/2019   two simple adenomas, diverticulosis,  and internal hemorrhoids.   . ESOPHAGEAL DILATION     "more than 3 times" (01/19/2014)  . ESOPHAGOGASTRODUODENOSCOPY  02/08/11   [place]-Distal esophageal erosion consistent with mild erosive reflux   esophagitis/ Noncritical  Schatzki ring, small hiatal hernia otherwise upper/ gastrointestinal tract appeared unremarkable, status post passage  of a Maloney dilation to biopsy disruption of the ring described  . ESOPHAGOGASTRODUODENOSCOPY  04/2012   2 tandem incomplete distal esophagea rings s/p dilation.   . ESOPHAGOGASTRODUODENOSCOPY N/A 09/16/2014   Dr. Gala Romney: Schatzki ring status post dilation/disruption.  Hiatal hernia.  Marland Kitchen ESOPHAGOGASTRODUODENOSCOPY (EGD) WITH PROPOFOL N/A 12/08/2018   EGD with mild Schatzki ring s/p dilation, small hiatal hernia, otherwise normal  . EYE SURGERY     cataracts, bilateral  . INCONTINENCE SURGERY  08/26/09   [place]  . JOINT REPLACEMENT     right total knee  . MALONEY DILATION N/A 09/16/2014   Procedure: Venia Minks DILATION;  Surgeon: Daneil Dolin, MD;  [facility];  Service: Endoscopy;  Laterality: N/A;  . Venia Minks DILATION N/A 12/08/2018   Procedure: Venia Minks DILATION;  Surgeon: Daneil Dolin, MD;  [facility];  Service: Endoscopy;  Laterality: N/A;  . PARS PLANA VITRECTOMY W/ REPAIR OF MACULAR HOLE Left 01/19/2014  . POLYPECTOMY  02/26/2019   Procedure: POLYPECTOMY;  Surgeon: Danie Binder, MD;  [facility];  Service: Endoscopy;;  ascending colon polyps x2  . SAVORY DILATION N/A 09/16/2014   Procedure: SAVORY DILATION;  Surgeon: Daneil Dolin, MD;  [facility];  Service: Endoscopy;  Laterality: N/A;  . TONSILLECTOMY    . TOTAL KNEE ARTHROPLASTY Right 05/13/07   Dr. Aline Brochure  . TRIGGER FINGER RELEASE Right 06/26/2018   Procedure: RIGHT INDEX FINGER TRIGGER FINGER/A-1 PULLEY RELEASE;  Surgeon: Carole Civil, MD;  Location: [facility];  Service: Orthopedics;  Laterality: Right;    Current Outpatient Medications  Medication Sig Dispense Refill  . ACETAMINOPHEN EXTRA STRENGTH 500 MG tablet TAKE (1) TABLET BY MOUTH AT BEDTIME. 30 tablet 2  . amLODipine (NORVASC) 10 MG tablet TAKE 1 TABLET BY MOUTH ONCE A DAY. 30 tablet 0  . B-D  ULTRAFINE III SHORT PEN 31G X 8 MM MISC USE ONCE DAILY WITH LANTUS SOLOSTAR PEN. 100 each 0  . brimonidine-timolol (COMBIGAN) 0.2-0.5 % ophthalmic solution Place 1 drop into both eyes every 12 (twelve) hours.    . cetirizine (ZYRTEC) 10 MG tablet TAKE 1 TABLET BY MOUTH ONCE A DAY. 30 tablet 5  . Cholecalciferol (VITAMIN D3) 2000 units TABS Take 2,000 Units by mouth at bedtime.    . citalopram (CELEXA) 20 MG tablet TAKE 1 TABLET BY MOUTH ONCE A DAY. 30 tablet 5  . Cod Liver Oil CAPS Take by mouth. 500 MG per patient    . Cranberry 1000 MG CAPS Take 1,000 mg by mouth daily.    . cycloSPORINE (RESTASIS) 0.05 % ophthalmic emulsion Place 1 drop into both eyes 2 (two) times daily.     Marland Kitchen diltiazem (CARDIZEM CD) 240 MG 24 hr capsule TAKE (1) CAPSULE BY MOUTH TWICE DAILY. [place]  capsule 5  . docusate sodium (COLACE) 100 MG capsule Take 100 mg by mouth at bedtime.    Marland Kitchen ezetimibe (ZETIA) 10 MG tablet TAKE 1 TABLET BY MOUTH ONCE A DAY. 30 tablet 2  . Flaxseed, Linseed, (FLAXSEED OIL) 1000 MG CAPS Take 1,000 mg by mouth daily.    . fluticasone (FLONASE) 50 MCG/ACT nasal spray PLACE 2 SPRAYS INTO BOTH NOSTRILS DAILY. (Patient taking differently: Place 2 sprays into both nostrils as needed. ) 16 g 0  . gabapentin (NEURONTIN) 300 MG capsule Take 1 capsule (300 mg total) by mouth 2 (two) times daily. 60 capsule 5  . glucose blood test strip     . hydrALAZINE (APRESOLINE) 50 MG tablet Take 1 tablet (50 mg total) by mouth 3 (three) times daily. 90 tablet 5  . [START ON 08/29/2019] HYDROcodone-acetaminophen (NORCO) 10-325 MG tablet Take one tablet by mouth two times daily for pain 60 tablet 0  . LANTUS SOLOSTAR 100 UNIT/ML Solostar Pen INJECT 30 UNITS SUBCUTANEOUSLY ONCE DAILY. (Patient taking differently: Inject 30 Units into the skin daily. ) 15 mL 5  . losartan (COZAAR) 100 MG tablet TAKE (1) TABLET BY MOUTH DAILY. 30 tablet 5  . Multiple Vitamin (MULTIVITAMIN WITH MINERALS) TABS tablet Take 1 tablet by mouth at  bedtime.    . ondansetron (ZOFRAN) 4 MG tablet TAKE (1) TABLET BY MOUTH EVERY FOUR HOURS AS NEEDED FOR NAUSEA. 30 tablet 0  . ONETOUCH DELICA LANCETS 62E MISC USE AS DIRECTED 3 TIMES DAILY FOR BLOOD GLUCOSE TESTING. 100 each 5  . pantoprazole (PROTONIX) 40 MG tablet TAKE 1 TABLET BY MOUTH TWICE DAILY BEFORE A MEAL. 60 tablet 5  . polyethylene glycol powder (GLYCOLAX/MIRALAX) powder MIX 17G (1 CAPFUL) IN 8 OUNCES OF JUICE OR WATER AND DRINK ONCE DAILY. 527 g 0  . predniSONE (DELTASONE) 5 MG tablet Take 1 tablet (5 mg total) by mouth daily. 30 tablet 0  . PROAIR HFA 108 (90 Base) MCG/ACT inhaler INHALE 2 PUFFS BY MOUTH EVERY 6 HOURS AS NEEDED . (Patient taking differently: Inhale 2 puffs into the lungs every 6 (six) hours as needed for wheezing or shortness of breath. ) 8.5 g 5  . pyridostigmine (MESTINON) 60 MG tablet TAKE (1) TABLET BY MOUTH THREE TIMES A DAY. 100 tablet 3  . rosuvastatin (CRESTOR) 40 MG tablet TAKE (1) TABLET BY MOUTH AT BEDTIME. 30 tablet 5  . solifenacin (VESICARE) 10 MG tablet TAKE 1 TABLET BY MOUTH ONCE A DAY. 30 tablet 5  . spironolactone (ALDACTONE) 25 MG tablet TAKE 1 TABLET BY MOUTH ONCE A DAY. 30 tablet 5  . spironolactone-hydrochlorothiazide (ALDACTAZIDE) 25-25 MG tablet     . TRADJENTA 5 MG TABS tablet TAKE 1 TABLET BY MOUTH ONCE A DAY. 30 tablet 3  . UNABLE TO FIND Diabetic shoes x 1 Inserts x 3 DX E11.9 1 each 0  . venlafaxine XR (EFFEXOR-XR) 150 MG 24 hr capsule TAKE 1 CAPSULE BY MOUTH DAILY. 30 capsule 2  . ezetimibe (ZETIA) 10 MG tablet     . gabapentin (NEURONTIN) 300 MG capsule     . TRADJENTA 5 MG TABS tablet     . TRADJENTA 5 MG TABS tablet TAKE 1 TABLET BY MOUTH ONCE A DAY. (Patient not taking: Reported on 07/28/2019) 30 tablet 2   No current facility-administered medications for this visit.     Allergies as of 07/28/2019  . (No Known Allergies)    Family History  Problem Relation Age of Onset  . Cancer  Mother   . Heart disease Mother   . Cancer  Father   . Heart disease Father   . Diabetes Sister   . Hypertension Brother   . Heart disease Brother   . Cancer Sister   . Kidney failure Sister   . Diabetes Son   . Hypertension Son   . Hypertension Daughter   . Colon cancer Neg Hx     Social History   Socioeconomic History  . Marital status: Single    Spouse name: Not on file  . Number of children: 2  . Years of education: Not on file  . Highest education level: Not on file  Occupational History  . Occupation: disabled    Employer: RETIRED  Social Needs  . Financial resource strain: Not very hard  . Food insecurity    Worry: Never true    Inability: Never true  . Transportation needs    Medical: Yes    Non-medical: No  Tobacco Use  . Smoking status: Former Smoker    Packs/day: 0.50    Years: 25.00    Pack years: 12.50    Types: Cigarettes    Quit date: 01/01/2004    Years since quitting: 15.5  . Smokeless tobacco: Never Used  Substance and Sexual Activity  . Alcohol use: No  . Drug use: No  . Sexual activity: Never    Birth control/protection: Surgical  Lifestyle  . Physical activity    Days per week: 0 days    Minutes per session: 0 min  . Stress: Only a little  Relationships  . Social Herbalist on phone: More than three times a week    Gets together: Never    Attends religious service: Never    Active member of club or organization: No    Attends meetings of clubs or organizations: Never    Relationship status: Never married  Other Topics Concern  . Not on file  Social History Narrative   Patient lives at home by herself   Patient drinks one cup of coffee a day   Patient is right handed.    Review of Systems: Gen: see HPI  CV: Denies chest pain, palpitations, syncope, peripheral edema, and claudication. Resp: Denies dyspnea at rest, cough, wheezing, coughing up blood, and pleurisy. GI: see HPI  Derm: Denies rash, itching, dry skin Psych: Denies depression, anxiety, memory loss,  confusion. No homicidal or suicidal ideation.  Heme: Denies bruising, bleeding, and enlarged lymph nodes.  Physical Exam: BP (!) 142/55   Pulse (!) 52   Temp (!) 96.6 F (35.9 C) (Temporal)   Ht 5\' 6"  (1.676 m)   Wt 263 lb 12.8 oz (119.7 kg)   BMI 42.58 kg/m  General:   Alert and oriented. No distress noted. Pleasant and cooperative.  Head:  Normocephalic and atraumatic. Eyes:  Conjuctiva clear without scleral icterus. Mouth:  Oral mucosa pink and moist.  Abdomen:  +BS, soft, non-tender and non-distended. No rebound or guarding. No HSM or masses noted. Msk:  Symmetrical without gross deformities. Normal posture. Extremities:  Without edema. Neurologic:  Alert and  oriented x4 Psych:  Alert and cooperative. Normal mood and affect.  Lab Results  Component Value Date   WBC 8.5 06/10/2019   HGB 9.9 (L) 06/10/2019   HCT 32.7 (L) 06/10/2019   MCV 93.7 06/10/2019   PLT 263 06/10/2019   Lab Results  Component Value Date   IRON 51 06/10/2019   TIBC 277 06/10/2019  FERRITIN 61 06/10/2019

## 2019-07-28 NOTE — Assessment & Plan Note (Signed)
Persistent, with continued need for infusions. Symptomatic. Ferritin dropping. Colonoscopy on file, with EGD fairly recent as well (Dec 2019). Pursue capsule study to assess small bowel. Will order agile capsule first as she has history of multiple abdominal procedures. If agile without issues, pursue capsule study. 3-4 month return.

## 2019-07-28 NOTE — Telephone Encounter (Signed)
LMTCB for pt to schedule AGILE capsule

## 2019-07-28 NOTE — Telephone Encounter (Signed)
Patient called back. She is scheduled for AGILE 8/5 at 7:30am. Patient aware need to arrive at 7am at [facility]. I discussed instructions with patient in detail. I have also mailed copy to her (confirmed address).

## 2019-07-29 ENCOUNTER — Encounter: Payer: Self-pay | Admitting: Family Medicine

## 2019-07-29 LAB — HEMOGLOBIN A1C
Hgb A1c MFr Bld: 6.9 % of total Hgb — ABNORMAL HIGH (ref <5.7)
Mean Plasma Glucose: 151 (calc)
eAG (mmol/L): 8.4 (calc)

## 2019-07-29 LAB — LIPID PANEL
Cholesterol: 154 mg/dL (ref <200)
HDL: 50 mg/dL (ref >=50)
LDL Cholesterol (Calc): 89 mg/dL (calc)
Non-HDL Cholesterol (Calc): 104 mg/dL (calc) (ref <130)
Total CHOL/HDL Ratio: 3.1 (calc) (ref <5.0)
Triglycerides: 60 mg/dL (ref <150)

## 2019-07-29 NOTE — Progress Notes (Signed)
cc'ed to pcp °

## 2019-08-03 ENCOUNTER — Telehealth: Payer: Self-pay

## 2019-08-03 NOTE — Telephone Encounter (Signed)
Linzess 72 mcg samples are available and ready for pickup. Pt is aware.

## 2019-08-05 ENCOUNTER — Other Ambulatory Visit: Payer: Self-pay

## 2019-08-05 ENCOUNTER — Encounter (HOSPITAL_COMMUNITY)
Admission: RE | Disposition: A | Discharge status: Home / Self Care | Payer: Self-pay | Source: Ambulatory Visit | Attending: Internal Medicine

## 2019-08-05 ENCOUNTER — Ambulatory Visit (HOSPITAL_COMMUNITY)
Admission: RE | Admit: 2019-08-05 | Discharge: 2019-08-05 | Disposition: A | Discharge status: Home / Self Care | Payer: Medicare Other | Source: Ambulatory Visit | Attending: Internal Medicine | Admitting: Internal Medicine

## 2019-08-05 ENCOUNTER — Encounter (HOSPITAL_COMMUNITY): Payer: Self-pay | Admitting: *Deleted

## 2019-08-05 DIAGNOSIS — D509 Iron deficiency anemia, unspecified: Secondary | ICD-10-CM | POA: Diagnosis not present

## 2019-08-05 HISTORY — PX: AGILE CAPSULE: SHX5420

## 2019-08-05 SURGERY — AGILE CAPSULE

## 2019-08-05 MED ORDER — SODIUM CHLORIDE 0.9 % IV SOLN: INTRAVENOUS | Status: DC

## 2019-08-06 ENCOUNTER — Ambulatory Visit (HOSPITAL_COMMUNITY)
Admission: RE | Admit: 2019-08-06 | Discharge: 2019-08-06 | Disposition: A | Discharge status: Home / Self Care | Payer: Medicare Other | Source: Ambulatory Visit | Attending: Gastroenterology | Admitting: Gastroenterology

## 2019-08-06 DIAGNOSIS — D508 Other iron deficiency anemias: Secondary | ICD-10-CM | POA: Diagnosis not present

## 2019-08-06 DIAGNOSIS — T184XXA Foreign body in colon, initial encounter: Secondary | ICD-10-CM | POA: Diagnosis not present

## 2019-08-11 ENCOUNTER — Other Ambulatory Visit: Payer: Self-pay | Admitting: Family Medicine

## 2019-08-12 ENCOUNTER — Encounter: Payer: Self-pay | Admitting: *Deleted

## 2019-08-12 ENCOUNTER — Other Ambulatory Visit: Payer: Self-pay | Admitting: *Deleted

## 2019-08-12 DIAGNOSIS — D509 Iron deficiency anemia, unspecified: Secondary | ICD-10-CM

## 2019-08-12 NOTE — Progress Notes (Signed)
Discussed with radiology. Felt to be at the [facility]. Please arrange capsule study due to IDA.

## 2019-08-17 ENCOUNTER — Encounter (HOSPITAL_COMMUNITY): Payer: Self-pay | Admitting: Internal Medicine

## 2019-08-17 ENCOUNTER — Other Ambulatory Visit: Payer: Self-pay | Admitting: Family Medicine

## 2019-08-19 DIAGNOSIS — H401132 Primary open-angle glaucoma, bilateral, moderate stage: Secondary | ICD-10-CM | POA: Diagnosis not present

## 2019-08-19 LAB — HM DIABETES EYE EXAM

## 2019-08-31 ENCOUNTER — Telehealth: Payer: Self-pay | Admitting: *Deleted

## 2019-08-31 NOTE — Telephone Encounter (Signed)
Patient scheduled for givens capsule 9/10. Fowarding to EG who is the hosp app for 9/11 (Friday) as an [employer]

## 2019-08-31 NOTE — Telephone Encounter (Signed)
Noted  

## 2019-09-10 ENCOUNTER — Telehealth: Payer: Self-pay | Admitting: *Deleted

## 2019-09-10 NOTE — Telephone Encounter (Signed)
Noted, thanks for the FYI. 

## 2019-09-10 NOTE — Telephone Encounter (Signed)
Called patient. She has rescheduled to 9/17 at 7:30am. Patient aware she needs to arrive at [facility] short stay at 7:00am. Discussed prep instructions in detail with patient. She voiced understanding. Instructions also mailed. Called endo and LMOVM of appt time.date. Fowarding to EG as he will be the hospitalitis to read the study;

## 2019-09-10 NOTE — Telephone Encounter (Signed)
Received call from endo patient did not show up for [place]. They called patient and she didn't realize the 10th was today. Patient needs to be rescheduled.

## 2019-09-15 ENCOUNTER — Other Ambulatory Visit: Payer: Self-pay | Admitting: Family Medicine

## 2019-09-16 ENCOUNTER — Telehealth (HOSPITAL_COMMUNITY): Payer: Self-pay | Admitting: Surgery

## 2019-09-16 NOTE — Telephone Encounter (Signed)
Noted  

## 2019-09-16 NOTE — Telephone Encounter (Signed)
Patient called. Needs to r/s givens d/t vomiting since Monday. She is worried she would vomit the capsule back up.  Called endo and advised we are rescheduling.  Patient rescheduled to 9/22 at 7:30am. Patient aware to arrive at 7:00am./ discussed instructions in detail with patient. FYI to EG

## 2019-09-16 NOTE — Telephone Encounter (Signed)
Pt had left a voicemail saying she had vomited all night and was feeling sweaty, and she was concerned about taking a pill for a test tomorrow.    Pt is scheduled for a test tomorrow with Dr. Gala Romney and a bone scan and labs.  I called and spoke to the pt and advised her to call Dr. Roseanne Kaufman office to let them know about the vomiting and to see if her test tomorrow needs to be rescheduled.  Also, told pt the time for her labs and bone scan tomorrow. She states that she is feeling very weak and is a diabetic.  I instructed her to check her blood sugar and to call her primary doctor.  Pt verbalized understanding and was told to call our office if she had any more concerns.

## 2019-09-17 ENCOUNTER — Ambulatory Visit (INDEPENDENT_AMBULATORY_CARE_PROVIDER_SITE_OTHER): Payer: Medicare Other | Admitting: Family Medicine

## 2019-09-17 ENCOUNTER — Encounter: Payer: Self-pay | Admitting: Family Medicine

## 2019-09-17 ENCOUNTER — Inpatient Hospital Stay (HOSPITAL_COMMUNITY): Payer: Medicare Other

## 2019-09-17 ENCOUNTER — Other Ambulatory Visit: Payer: Self-pay

## 2019-09-17 DIAGNOSIS — K2900 Acute gastritis without bleeding: Secondary | ICD-10-CM | POA: Diagnosis not present

## 2019-09-17 DIAGNOSIS — R112 Nausea with vomiting, unspecified: Secondary | ICD-10-CM | POA: Diagnosis not present

## 2019-09-17 MED ORDER — ONDANSETRON 4 MG PO TBDP: 4.0000 mg | ORAL_TABLET | Freq: Three times a day (TID) | ORAL | 0 refills | Status: DC | PRN

## 2019-09-17 NOTE — Patient Instructions (Signed)
    Thank you for completing your visit via telephone. I appreciate the opportunity to provide you with the care for your health and wellness. Today we discussed: Gastroenteritis-upset stomach  (I hope you feel better soon!)  Follow up: 09/22/2019-as already scheduled  No labs or referrals today  You are being treated for an acute case of gastroenteritis secondary to possible ingestion of virus and or secondary to bad food such as in your salad.  I have sent in dissolving Zofran tablets to help with your nausea and vomiting.  Try to start getting liquids in 30 minutes after you take this medication.  If you are unable to get liquids and in the next 24 to 48 hours we advise you to go to the emergency room as you will be significantly dehydrated over the last couple days.  If you develop diarrhea, having increase in temperature, or are still unable to keep anything down over the weekend we advised her to go to the emergency room.  If you take this medicine and are able to eat but you just are not feeling any better or you feel like you are getting worse, Monday please call the office back.  Please reintroduce foods into your system slowly.  Foods that are easy to digest and do not irritate the stomach include applesauce, bananas, boiled on seasoned chicken, plain yogurt, dry toast, boiled eggs.  Please continue to practice social distancing to keep you, your family, and our community safe.  If you must go out, please wear a Mask and practice good handwashing.  Arvin YOUR HANDS WELL AND FREQUENTLY. AVOID TOUCHING YOUR FACE, UNLESS YOUR HANDS ARE FRESHLY WASHED.  GET FRESH AIR DAILY. STAY HYDRATED WITH WATER.   It was a pleasure to see you and I look forward to continuing to work together on your health and well-being. Please do not hesitate to call the office if you need care or have questions about your care.  Have a wonderful day and week. With Gratitude, Cherly Beach, DNP, AGNP-BC

## 2019-09-17 NOTE — Progress Notes (Signed)
Virtual Visit via Telephone Note   This visit type was conducted due to national recommendations for restrictions regarding the COVID-19 Pandemic (e.g. social distancing) in an effort to limit this patient's exposure and mitigate transmission in our community.  Due to her co-morbid illnesses, this patient is at least at moderate risk for complications without adequate follow up.  This format is felt to be most appropriate for this patient at this time.  The patient did not have access to video technology/had technical difficulties with video requiring transitioning to audio format only (telephone).  All issues noted in this document were discussed and addressed.  No physical exam could be performed with this format.    Evaluation Performed:  Follow-up visit  Date:  09/17/2019   ID:  [name] Dec 15, 1941, MRN [id number]  Patient Location: Home Provider Location: Office  Location of Patient: Home Location of Provider: Telehealth Consent was obtain for visit to be over via telehealth. I verified that I am speaking with the correct person using two identifiers.  PCP:  Fayrene Helper, MD   Chief Complaint:  Upset stomach  History of Present Illness:    [name] is a 78 y.o. female with acute onset of upset stomach.  Reports that she ate a salad on Monday and later in the evening she started having nausea and vomiting.  She reports that it was yellow at first.  She reports that she is unable to keep anything down and has not even been able to take the oral Zofran that she has at home because she vomits it back up.  She reports she has not been able to keep fluids down since Monday.  She reports that she is feeling tired and weak.  She denies having any problems with diarrhea or constipation or cramping or stomach pain.  She denies having a fever.  She denies being exposed to anybody that has similar symptoms.  Has been home for about 2 weeks now without going out.   Family brought [name] from [employer] [facility] on Monday and ever since then she is been sick.  The patient does not have symptoms concerning for COVID-19 infection (fever, chills, cough, or new shortness of breath).   Past Medical, Surgical, Social History, Allergies, and Medications have been Reviewed.  Past Medical History:  Diagnosis Date  . Anemia in chronic renal disease 12/30/2015  . Anxiety   . Arthritis    "all over" (01/19/2014)  . Arthritis, lumbar spine   . Carpal tunnel syndrome   . Chronic bronchitis ([place])    "got it q yr for awhile; hasn't had it in awhile" (01/19/2014)  . Chronic kidney disease (CKD) stage G3b/A1, moderately decreased glomerular filtration rate (GFR) between 30-44 mL/min/1.73 square meter and albuminuria creatinine ratio less than 30 mg/g (HCC) 09/14/2009   Qualifier: Diagnosis of  By: Moshe Cipro MD, Joycelyn Schmid    . Chronic renal disease, stage 3, moderately decreased glomerular filtration rate (GFR) between 30-59 mL/min/1.73 square meter (HCC) 09/14/2009   Qualifier: Diagnosis of  By: Moshe Cipro MD, Joycelyn Schmid    . Complication of anesthesia    combative  . COPD (chronic obstructive pulmonary disease) ([place])   . Depression   . Diverticulosis 07/2004   Colonscopy Dr Gala Romney  . Esophagitis, erosive 2009  . Exertional shortness of breath   . Gastroesophageal reflux   . [id number])    "usually a couple times/wk" (01/19/2014)  . Heart murmur    saw cardiology In  [place], he told her she did not need to come back.  . Hyperlipidemia   . Hypertension   . Impingement syndrome, shoulder   . Iron deficiency anemia   . Mitral regurgitation   . Myasthenia gravis   . Myasthenia gravis in remission ([place]) 11/24/2014  . Myasthenia gravis in remission ([place])   . Obesity   . OSA on CPAP    Negative on last sleep study  . Pneumonia 01/2012  . Pulmonary HTN ([place])   . Schatzki's ring    Last EGD w/ dilation 02/08/11, 2009 & 2007  . Seasonal allergies   . Shoulder pain   .  Thyroid disease    "used to take RX; they took me off it" (01/19/2014)  . Tobacco abuse   . Type II diabetes mellitus ([place])    Past Surgical History:  Procedure Laterality Date  . [place]ard VITRECTOMY WITH 20 GAUGE MVR PORT Left 01/19/2014   Procedure: 25 GAUGE PARS PLANA VITRECTOMY WITH 20 GAUGE MVR PORT; MEMBRAME PEEL; SERUM PATCH; LASER TREATMENT; C3F8;  Surgeon: Hayden Pedro, MD;  Location: [facility];  Service: Ophthalmology;  Laterality: Left;  . ABDOMINAL HYSTERECTOMY    . AGILE CAPSULE N/A 08/05/2019   Procedure: AGILE CAPSULE;  Surgeon: Daneil Dolin, MD;  [facility];  Service: Endoscopy;  Laterality: N/A;  7:30am  . APPENDECTOMY    . CARPAL TUNNEL RELEASE Bilateral   . CATARACT EXTRACTION W/PHACO Left 10/21/2013   Procedure: LEFT CATARACT EXTRACTION PHACO AND INTRAOCULAR LENS PLACEMENT (IOC);  Surgeon: Marylynn Pearson, MD;  Location: [facility];  Service: Ophthalmology;  Laterality: Left;  . CHOLECYSTECTOMY    . COLONOSCOPY  05/08/2012   [id number] and external hemorrhoids; colonic diverticulosis  . COLONOSCOPY WITH PROPOFOL N/A 02/26/2019   two simple adenomas, diverticulosis,  and internal hemorrhoids.   . ESOPHAGEAL DILATION     "more than 3 times" (01/19/2014)  . ESOPHAGOGASTRODUODENOSCOPY  02/08/11   Rourk-Distal esophageal erosion consistent with mild erosive reflux   esophagitis/ Noncritical Schatzki ring, small hiatal hernia otherwise upper/ gastrointestinal tract appeared unremarkable, status post passage  of a Maloney dilation to biopsy disruption of the ring described  . ESOPHAGOGASTRODUODENOSCOPY  04/2012   2 tandem incomplete distal esophagea rings s/p dilation.   . ESOPHAGOGASTRODUODENOSCOPY N/A 09/16/2014   Dr. Gala Romney: Schatzki ring status post dilation/disruption.  Hiatal hernia.  Marland Kitchen ESOPHAGOGASTRODUODENOSCOPY (EGD) WITH PROPOFOL N/A 12/08/2018   EGD with mild Schatzki ring s/p dilation, small hiatal hernia, otherwise normal  . EYE SURGERY     cataracts,  bilateral  . INCONTINENCE SURGERY  08/26/09   [facility]  . JOINT REPLACEMENT     right total knee  . MALONEY DILATION N/A 09/16/2014   Procedure: Venia Minks DILATION;  Surgeon: Daneil Dolin, MD;  [facility];  Service: Endoscopy;  Laterality: N/A;  . Venia Minks DILATION N/A 12/08/2018   Procedure: Venia Minks DILATION;  Surgeon: Daneil Dolin, MD;  [facility];  Service: Endoscopy;  Laterality: N/A;  . PARS PLANA VITRECTOMY W/ REPAIR OF MACULAR HOLE Left 01/19/2014  . POLYPECTOMY  02/26/2019   Procedure: POLYPECTOMY;  Surgeon: Danie Binder, MD;  [facility];  Service: Endoscopy;;  ascending colon polyps x2  . SAVORY DILATION N/A 09/16/2014   Procedure: SAVORY DILATION;  Surgeon: Daneil Dolin, MD;  [facility];  Service: Endoscopy;  Laterality: N/A;  . TONSILLECTOMY    . TOTAL KNEE ARTHROPLASTY Right 05/13/07   Dr.  Aline Brochure  . TRIGGER FINGER RELEASE Right 06/26/2018   Procedure: RIGHT INDEX FINGER TRIGGER FINGER/A-1 PULLEY RELEASE;  Surgeon: Carole Civil, MD;  Location: [facility];  Service: Orthopedics;  Laterality: Right;     Current Meds  Medication Sig  . ACETAMINOPHEN EXTRA STRENGTH 500 MG tablet TAKE (1) TABLET BY MOUTH AT BEDTIME.  Marland Kitchen amLODipine (NORVASC) 10 MG tablet TAKE 1 TABLET BY MOUTH ONCE A DAY.  Marland Kitchen B-D ULTRAFINE III SHORT PEN 31G X 8 MM MISC USE ONCE DAILY WITH LANTUS SOLOSTAR PEN.  . brimonidine-timolol (COMBIGAN) 0.2-0.5 % ophthalmic solution Place 1 drop into both eyes every 12 (twelve) hours.  . cetirizine (ZYRTEC) 10 MG tablet TAKE 1 TABLET BY MOUTH ONCE A DAY.  Marland Kitchen Cholecalciferol (VITAMIN D3) 2000 units TABS Take 2,000 Units by mouth at bedtime.  . citalopram (CELEXA) 20 MG tablet TAKE 1 TABLET BY MOUTH ONCE A DAY.  Marland Kitchen Cod Liver Oil CAPS Take by mouth. 500 MG per patient  . Cranberry 1000 MG CAPS Take 1,000 mg by mouth daily.  . cycloSPORINE (RESTASIS) 0.05 % ophthalmic emulsion Place 1 drop into both eyes 2 (two) times  daily.   Marland Kitchen diltiazem (CARDIZEM CD) 240 MG 24 hr capsule TAKE (1) CAPSULE BY MOUTH TWICE DAILY.  Marland Kitchen docusate sodium (COLACE) 100 MG capsule Take 100 mg by mouth at bedtime.  Marland Kitchen ezetimibe (ZETIA) 10 MG tablet   . ezetimibe (ZETIA) 10 MG tablet TAKE 1 TABLET BY MOUTH ONCE A DAY.  Marland Kitchen Flaxseed, Linseed, (FLAXSEED OIL) 1000 MG CAPS Take 1,000 mg by mouth daily.  . fluticasone (FLONASE) 50 MCG/ACT nasal spray PLACE 2 SPRAYS INTO BOTH NOSTRILS DAILY. (Patient taking differently: Place 2 sprays into both nostrils as needed. )  . gabapentin (NEURONTIN) 300 MG capsule Take 1 capsule (300 mg total) by mouth 2 (two) times daily.  Marland Kitchen gabapentin (NEURONTIN) 300 MG capsule   . gabapentin (NEURONTIN) 300 MG capsule TAKE (1) CAPSULE BY MOUTH TWICE DAILY.  Marland Kitchen glucose blood test strip   . hydrALAZINE (APRESOLINE) 50 MG tablet TAKE 1 TABLET BY MOUTH THREE TIMES A DAY.  Marland Kitchen HYDROcodone-acetaminophen (NORCO) 10-325 MG tablet Take one tablet by mouth two times daily for pain  . LANTUS SOLOSTAR 100 UNIT/ML Solostar Pen INJECT 30 UNITS SUBCUTANEOUSLY ONCE DAILY. (Patient taking differently: Inject 30 Units into the skin daily. )  . losartan (COZAAR) 100 MG tablet TAKE (1) TABLET BY MOUTH DAILY.  . Multiple Vitamin (MULTIVITAMIN WITH MINERALS) TABS tablet Take 1 tablet by mouth at bedtime.  . ondansetron (ZOFRAN) 4 MG tablet TAKE (1) TABLET BY MOUTH EVERY FOUR HOURS AS NEEDED FOR NAUSEA.  Glory Rosebush DELICA LANCETS 99991111 MISC USE AS DIRECTED 3 TIMES DAILY FOR BLOOD GLUCOSE TESTING.  . pantoprazole (PROTONIX) 40 MG tablet TAKE 1 TABLET BY MOUTH TWICE DAILY BEFORE A MEAL.  Marland Kitchen polyethylene glycol powder (GLYCOLAX/MIRALAX) powder MIX 17G (1 CAPFUL) IN 8 OUNCES OF JUICE OR WATER AND DRINK ONCE DAILY.  Marland Kitchen predniSONE (DELTASONE) 5 MG tablet Take 1 tablet (5 mg total) by mouth daily.  Marland Kitchen PROAIR HFA 108 (90 Base) MCG/ACT inhaler INHALE 2 PUFFS BY MOUTH EVERY 6 HOURS AS NEEDED .  Marland Kitchen pyridostigmine (MESTINON) 60 MG tablet TAKE (1) TABLET BY  MOUTH THREE TIMES A DAY.  . rosuvastatin (CRESTOR) 40 MG tablet TAKE (1) TABLET BY MOUTH AT BEDTIME.  Marland Kitchen solifenacin (VESICARE) 10 MG tablet TAKE 1 TABLET BY MOUTH ONCE A DAY.  Marland Kitchen spironolactone (ALDACTONE) 25 MG tablet TAKE 1 TABLET BY  MOUTH ONCE A DAY.  Marland Kitchen spironolactone-hydrochlorothiazide (ALDACTAZIDE) 25-25 MG tablet   . TRADJENTA 5 MG TABS tablet TAKE 1 TABLET BY MOUTH ONCE A DAY.  . TRADJENTA 5 MG TABS tablet   . TRADJENTA 5 MG TABS tablet TAKE 1 TABLET BY MOUTH ONCE A DAY.  Marland Kitchen UNABLE TO FIND Diabetic shoes x 1 Inserts x 3 DX E11.9  . venlafaxine XR (EFFEXOR-XR) 150 MG 24 hr capsule TAKE 1 CAPSULE BY MOUTH DAILY.     Allergies:   Patient has no known allergies.   Social History   Tobacco Use  . Smoking status: Former Smoker    Packs/day: 0.50    Years: 25.00    Pack years: 12.50    Types: Cigarettes    Quit date: 01/01/2004    Years since quitting: 15.7  . Smokeless tobacco: Never Used  Substance Use Topics  . Alcohol use: No  . Drug use: No     Family Hx: The patient's family history includes Cancer in her father, mother, and sister; Diabetes in her sister and son; Heart disease in her brother, father, and mother; Hypertension in her brother, daughter, and son; Kidney failure in her sister. There is no history of Colon cancer.  ROS:   Please see the history of present illness.    All other systems reviewed and are negative.   Labs/Other Tests and Data Reviewed:    Recent Labs: 06/10/2019: ALT 15; BUN 18; Creatinine, Ser 1.04; Hemoglobin 9.9; Platelets 263; Potassium 3.7; Sodium 137   Recent Lipid Panel Lab Results  Component Value Date/Time   CHOL 154 07/28/2019 12:36 PM   TRIG 60 07/28/2019 12:36 PM   HDL 50 07/28/2019 12:36 PM   CHOLHDL 3.1 07/28/2019 12:36 PM   LDLCALC 89 07/28/2019 12:36 PM    Wt Readings from Last 3 Encounters:  07/28/19 263 lb 12.8 oz (119.7 kg)  06/30/19 260 lb (117.9 kg)  06/18/19 258 lb 3 oz (117.1 kg)     Objective:    Vital  Signs:  There were no vitals taken for this visit.   GEN:  Alert and oriented RESPIRATORY:  No shortness of breath noted in conversation PSYCH:  Normal affect and mood  ASSESSMENT & PLAN:    1. Intractable vomiting with nausea, unspecified vomiting type Had Zofran at home but was unable to keep it down it is also expired because it was prescribed back in 2018.  Prescribed disintegrating tablets today she has her son going to pick them up.  Additionally advised to get some Gatorade, clear soda and some bland foods to help with getting some fluids and food to stay down.  Advised the precautions of when to go to the emergency room should she not be able to keep anything down.  Reviewed side effects, risks and benefits of medication.   Patient acknowledged agreement and understanding of the plan.   - ondansetron (ZOFRAN-ODT) 4 MG disintegrating tablet; Take 1 tablet (4 mg total) by mouth every 8 (eight) hours as needed for nausea or vomiting.  Dispense: 20 tablet; Refill: 0  2. Acute gastritis without hemorrhage, unspecified gastritis type S&S are consistent with the start of gastritis.  Do not believe this gastroenteritis at this time as she has no bowel involvement.  Possibly could be related to something that she ate or some other viral aspect.  Prescribe Zofran disintegrating tablets hopefully these will help.  Provided with precautions on when to go to the emergency room should she not feeling better  as she is gone 3 days now without getting liquids in. Additionally advised of the risk of diarrhea coming up.  And/or fever spiking.  Time:   Today, I have spent 10 minutes with the patient with telehealth technology discussing the above problems.     Medication Adjustments/Labs and Tests Ordered: Current medicines are reviewed at length with the patient today.  Concerns regarding medicines are outlined above.   Tests Ordered: No orders of the defined types were placed in this encounter.    Medication Changes: Meds ordered this encounter  Medications  . ondansetron (ZOFRAN-ODT) 4 MG disintegrating tablet    Sig: Take 1 tablet (4 mg total) by mouth every 8 (eight) hours as needed for nausea or vomiting.    Dispense:  20 tablet    Refill:  0    Order Specific Question:   Supervising Provider    Answer:   Fayrene Helper [id number]    Disposition:  Follow up 09/22/2019 as scheduled  Signed, Perlie Mayo, NP  09/17/2019 1:25 PM     [facility]

## 2019-09-21 ENCOUNTER — Other Ambulatory Visit: Payer: Self-pay | Admitting: Family Medicine

## 2019-09-22 ENCOUNTER — Ambulatory Visit: Payer: Medicare Other | Admitting: Family Medicine

## 2019-09-22 ENCOUNTER — Telehealth: Payer: Self-pay

## 2019-09-22 NOTE — OR Nursing (Signed)
Called patient due to her not showing up for Givens capsule study this am. She said it was rescheduled because she had been sick. I will notify the office when they open.

## 2019-09-22 NOTE — Telephone Encounter (Signed)
Routing to AB as FYI to read Givens 09/30/19.

## 2019-09-22 NOTE — Telephone Encounter (Signed)
[name] at [name] called office this morning, pt was no show for Givens capsule this morning. [name] called pt and she told her we had cancelled it because she was sick. However, pt was rescheduled for today.   Called pt, Givens rescheduled to 09/29/19 at 7:30am. Pt aware to arrive at 7:00am. New instructions given on phone and mailed. Pt had previous instructions (new dates given for her to change instructions). Endo scheduler informed of reschedule.

## 2019-09-23 ENCOUNTER — Encounter: Payer: Self-pay | Admitting: Family Medicine

## 2019-09-23 ENCOUNTER — Other Ambulatory Visit: Payer: Self-pay

## 2019-09-23 ENCOUNTER — Ambulatory Visit (INDEPENDENT_AMBULATORY_CARE_PROVIDER_SITE_OTHER): Payer: Medicare Other | Admitting: Family Medicine

## 2019-09-23 VITALS — BP 144/78 | Temp 98.0°F | Resp 15 | Ht 66.0 in | Wt 261.0 lb

## 2019-09-23 DIAGNOSIS — N3000 Acute cystitis without hematuria: Secondary | ICD-10-CM | POA: Diagnosis not present

## 2019-09-23 DIAGNOSIS — IMO0001 Reserved for inherently not codable concepts without codable children: Secondary | ICD-10-CM

## 2019-09-23 DIAGNOSIS — Z794 Long term (current) use of insulin: Secondary | ICD-10-CM

## 2019-09-23 DIAGNOSIS — E119 Type 2 diabetes mellitus without complications: Secondary | ICD-10-CM

## 2019-09-23 DIAGNOSIS — F418 Other specified anxiety disorders: Secondary | ICD-10-CM | POA: Diagnosis not present

## 2019-09-23 DIAGNOSIS — R112 Nausea with vomiting, unspecified: Secondary | ICD-10-CM | POA: Diagnosis not present

## 2019-09-23 DIAGNOSIS — I10 Essential (primary) hypertension: Secondary | ICD-10-CM

## 2019-09-23 LAB — POCT URINALYSIS DIP (CLINITEK)
Blood, UA: NEGATIVE
Glucose, UA: NEGATIVE mg/dL
Ketones, POC UA: NEGATIVE mg/dL
Leukocytes, UA: NEGATIVE
Nitrite, UA: NEGATIVE
POC PROTEIN,UA: 100 — AB
Spec Grav, UA: 1.025 (ref 1.010–1.025)
Urobilinogen, UA: 1 E.U./dL
pH, UA: 6 (ref 5.0–8.0)

## 2019-09-23 MED ORDER — ONDANSETRON 8 MG PO TBDP: 8.0000 mg | ORAL_TABLET | Freq: Three times a day (TID) | ORAL | 0 refills | Status: DC | PRN

## 2019-09-23 MED ORDER — ONDANSETRON HCL 4 MG/2ML IJ SOLN
4.0000 mg | Freq: Once | INTRAMUSCULAR | Status: AC
  Administered 2019-09-23: 4 mg via INTRAMUSCULAR

## 2019-09-23 NOTE — Patient Instructions (Addendum)
Annual physical exam wiith MD in office 12/18 or after, call if you need me sooner  Zofran 4 mg iM in office today  Please get cMP and eGFR and tSH and vit D tomorrow at hospital  New higher dose of zofran is prescribed    Please try to ensure you stay hydrated with frequen small sips of water  Thanks for choosing [facility], we consider it a privelige to serve you.

## 2019-09-24 ENCOUNTER — Inpatient Hospital Stay (HOSPITAL_COMMUNITY): Payer: Medicare Other | Attending: Hematology

## 2019-09-24 ENCOUNTER — Other Ambulatory Visit (HOSPITAL_COMMUNITY)
Admission: RE | Admit: 2019-09-24 | Discharge: 2019-09-24 | Disposition: A | Discharge status: Home / Self Care | Payer: Medicare Other | Source: Ambulatory Visit | Attending: Family Medicine | Admitting: Family Medicine

## 2019-09-24 ENCOUNTER — Ambulatory Visit (HOSPITAL_COMMUNITY): Payer: Medicare Other | Admitting: Nurse Practitioner

## 2019-09-24 DIAGNOSIS — D472 Monoclonal gammopathy: Secondary | ICD-10-CM | POA: Diagnosis not present

## 2019-09-24 DIAGNOSIS — D508 Other iron deficiency anemias: Secondary | ICD-10-CM | POA: Diagnosis not present

## 2019-09-24 DIAGNOSIS — I10 Essential (primary) hypertension: Secondary | ICD-10-CM | POA: Insufficient documentation

## 2019-09-24 LAB — COMPREHENSIVE METABOLIC PANEL
ALT: 16 U/L (ref 0–44)
AST: 18 U/L (ref 15–41)
Albumin: 3.6 g/dL (ref 3.5–5.0)
Alkaline Phosphatase: 141 U/L — ABNORMAL HIGH (ref 38–126)
Anion gap: 9 (ref 5–15)
BUN: 23 mg/dL (ref 8–23)
CO2: 27 mmol/L (ref 22–32)
Calcium: 9.1 mg/dL (ref 8.9–10.3)
Chloride: 106 mmol/L (ref 98–111)
Creatinine, Ser: 1.14 mg/dL — ABNORMAL HIGH (ref 0.44–1.00)
GFR calc Af Amer: 54 mL/min — ABNORMAL LOW (ref >60)
GFR calc non Af Amer: 47 mL/min — ABNORMAL LOW (ref >60)
Glucose, Bld: 230 mg/dL — ABNORMAL HIGH (ref 70–99)
Potassium: 3.7 mmol/L (ref 3.5–5.1)
Sodium: 142 mmol/L (ref 135–145)
Total Bilirubin: 0.6 mg/dL (ref 0.3–1.2)
Total Protein: 7.4 g/dL (ref 6.5–8.1)

## 2019-09-24 LAB — CBC WITH DIFFERENTIAL/PLATELET
Abs Immature Granulocytes: 0.02 10*3/uL (ref 0.00–0.07)
Basophils Absolute: 0 10*3/uL (ref 0.0–0.1)
Basophils Relative: 1 %
Eosinophils Absolute: 0.3 10*3/uL (ref 0.0–0.5)
Eosinophils Relative: 4 %
HCT: 36.7 % (ref 36.0–46.0)
Hemoglobin: 11 g/dL — ABNORMAL LOW (ref 12.0–15.0)
Immature Granulocytes: 0 %
Lymphocytes Relative: 17 %
Lymphs Abs: 1.4 10*3/uL (ref 0.7–4.0)
MCH: 28.9 pg (ref 26.0–34.0)
MCHC: 30 g/dL (ref 30.0–36.0)
MCV: 96.6 fL (ref 80.0–100.0)
Monocytes Absolute: 0.8 10*3/uL (ref 0.1–1.0)
Monocytes Relative: 9 %
Neutro Abs: 5.9 10*3/uL (ref 1.7–7.7)
Neutrophils Relative %: 69 %
Platelets: 305 10*3/uL (ref 150–400)
RBC: 3.8 MIL/uL — ABNORMAL LOW (ref 3.87–5.11)
RDW: 16.9 % — ABNORMAL HIGH (ref 11.5–15.5)
WBC: 8.4 10*3/uL (ref 4.0–10.5)
nRBC: 0 % (ref 0.0–0.2)

## 2019-09-24 LAB — IRON AND TIBC
Iron: 51 ug/dL (ref 28–170)
Saturation Ratios: 18 % (ref 10.4–31.8)
TIBC: 291 ug/dL (ref 250–450)
UIBC: 240 ug/dL

## 2019-09-24 LAB — LACTATE DEHYDROGENASE: LDH: 248 U/L — ABNORMAL HIGH (ref 98–192)

## 2019-09-24 LAB — FERRITIN: Ferritin: 212 ng/mL (ref 11–307)

## 2019-09-24 LAB — TSH: TSH: 0.68 u[IU]/mL (ref 0.350–4.500)

## 2019-09-24 LAB — VITAMIN B12: Vitamin B-12: 589 pg/mL (ref 180–914)

## 2019-09-24 LAB — FOLATE: Folate: 12.4 ng/mL (ref >5.9)

## 2019-09-25 LAB — IGG, IGA, IGM
IgA: 285 mg/dL (ref 64–422)
IgG (Immunoglobin G), Serum: 1371 mg/dL (ref 586–1602)
IgM (Immunoglobulin M), Srm: 133 mg/dL (ref 26–217)

## 2019-09-25 LAB — KAPPA/LAMBDA LIGHT CHAINS
Kappa free light chain: 41.4 mg/L — ABNORMAL HIGH (ref 3.3–19.4)
Kappa, lambda light chain ratio: 1.41 (ref 0.26–1.65)
Lambda free light chains: 29.4 mg/L — ABNORMAL HIGH (ref 5.7–26.3)

## 2019-09-25 LAB — VITAMIN D 25 HYDROXY (VIT D DEFICIENCY, FRACTURES): Vit D, 25-Hydroxy: 25.7 ng/mL — ABNORMAL LOW (ref 30.0–100.0)

## 2019-09-27 ENCOUNTER — Encounter: Payer: Self-pay | Admitting: Family Medicine

## 2019-09-27 NOTE — Assessment & Plan Note (Signed)
[name] is reminded of the importance of commitment to daily physical activity for 30 minutes or more, as able and the need to limit carbohydrate intake to 30 to 60 grams per meal to help with blood sugar control.   The need to take medication as prescribed, test blood sugar as directed, and to call between visits if there is a concern that blood sugar is uncontrolled is also discussed.   [name] is reminded of the importance of daily foot exam, annual eye examination, and good blood sugar, blood pressure and cholesterol control. Controlled, no change in medication   Diabetic Labs Latest Ref Rng & Units 09/24/2019 07/28/2019 06/10/2019 03/03/2019 01/07/2019  HbA1c <5.7 % of total Hgb - 6.9(H) - - -  Microalbumin mg/dL - - - - -  Micro/Creat Ratio <30 mcg/mg creat - - - - -  Chol <200 mg/dL - 154 - - -  HDL > OR = 50 mg/dL - 50 - - -  Calc LDL mg/dL (calc) - 89 - - -  Triglycerides <150 mg/dL - 60 - - -  Creatinine 0.44 - 1.00 mg/dL 1.14(H) - 1.04(H) 0.94 0.87   BP/Weight 09/23/2019 07/28/2019 07/01/2019 06/30/2019 06/24/2019 06/18/2019 0000000  Systolic BP 123456 [id number] 0000000 [id number] 0000000 Q000111Q [id number]  Diastolic BP 78 55 71 68 54 81 51  Wt. (Lbs) 261 263.8 - 260 - 258.19 266  BMI 42.13 42.58 - 41.97 - 41.67 42.93   Foot/eye exam completion dates Latest Ref Rng & Units 08/19/2019 02/24/2019  Eye Exam No Retinopathy No Retinopathy Retinopathy(A)  Foot Form Completion - - -

## 2019-09-27 NOTE — Assessment & Plan Note (Signed)
2 week  H/o uncontrolled symptoms, has up coming appt with GI. Will f/u labs ordered through oncology tomorrow. Zofran 4 mg iM in office and higher dose of oral zofran is prescribed

## 2019-09-27 NOTE — Assessment & Plan Note (Signed)
Systolic pressure elevated ,. However pt has been vomiting, re evaluate at next visit Ms. Gurule is reminded of the importance of commitment to daily physical activity for 30 minutes or more, as able and the need to limit carbohydrate intake to 30 to 60 grams per meal to help with blood sugar control.   The need to take medication as prescribed, test blood sugar as directed, and to call between visits if there is a concern that blood sugar is uncontrolled is also discussed.   Ms. Carkhuff is reminded of the importance of daily foot exam, annual eye examination, and good blood sugar, blood pressure and cholesterol control.  Diabetic Labs Latest Ref Rng & Units 09/24/2019 07/28/2019 06/10/2019 03/03/2019 01/07/2019  HbA1c <5.7 % of total Hgb - 6.9(H) - - -  Microalbumin mg/dL - - - - -  Micro/Creat Ratio <30 mcg/mg creat - - - - -  Chol <200 mg/dL - 154 - - -  HDL > OR = 50 mg/dL - 50 - - -  Calc LDL mg/dL (calc) - 89 - - -  Triglycerides <150 mg/dL - 60 - - -  Creatinine 0.44 - 1.00 mg/dL 1.14(H) - 1.04(H) 0.94 0.87   BP/Weight 09/23/2019 07/28/2019 07/01/2019 06/30/2019 06/24/2019 06/18/2019 0000000  Systolic BP 123456 A999333 0000000 [id number] 0000000 Q000111Q [id number]  Diastolic BP 78 55 71 68 54 81 51  Wt. (Lbs) 261 263.8 - 260 - 258.19 266  BMI 42.13 42.58 - 41.97 - 41.67 42.93   Foot/eye exam completion dates Latest Ref Rng & Units 08/19/2019 02/24/2019  Eye Exam No Retinopathy No Retinopathy Retinopathy(A)  Foot Form Completion - - -

## 2019-09-27 NOTE — Progress Notes (Signed)
[name]     MRN: [id number]      DOB: Jun 19, 1941   HPI [name] is here for follow up and re-evaluation of chronic medical conditions, medication management and review of any available recent lab and radiology data.  Preventive health is updated, specifically  Cancer screening and Immunization.   Questions or concerns regarding consultations or procedures which the PT has had in the interim are  addressed. The PT denies any adverse reactions to current medications since the last visit.  C/o 2 weeks of nausea and vomit, denies loose stool , fever or chills, feels ill and weak  ROS Denies recent fever or chills. Denies sinus pressure, nasal congestion, ear pain or sore throat. Denies chest congestion, productive cough or wheezing. Denies chest pains, palpitations and leg swelling .   Denies dysuria, frequency, hesitancy or incontinence. C/o chronic  joint pain, swelling and limitation in mobility. Denies headaches, seizures, numbness, or tingling. C/o depression, anxiety not suicidal or homicidal Denies skin break down or rash.   PE  BP (!) 144/78    Temp 98 F (36.7 C) (Temporal)    Resp 15    Ht 5\' 6"  (1.676 m)    Wt 261 lb (118.4 kg)    BMI 42.13 kg/m   Patient alert and oriented and in no cardiopulmonary distress.Ill  appearing  HEENT: No facial asymmetry, EOMI,   oropharynx pink and moist.  Neck decreased ROM no JVD, no mass.  Chest: Clear to auscultation bilaterally.  CVS: S1, S2 no murmurs, no S3.Regular rate.  ABD: Soft non tender.   Ext: No edema  MS: decreased  ROM spine, shoulders, hips and knees.  Skin: Intact, no ulcerations or rash noted.  Psych: Good eye contact, normal affect. Memory intact mildly anxious and  depressed appearing.  CNS: CN 2-12 intact, power,  normal throughout.no focal deficits noted.   Assessment & Plan  Nausea and vomiting 2 week  H/o uncontrolled symptoms, has up coming appt with GI. Will f/u labs ordered through  oncology tomorrow. Zofran 4 mg iM in office and higher dose of oral zofran is prescribed  Depression with anxiety Continue current medication  Diabetes mellitus, insulin dependent (IDDM), controlled ([name]) [name] is reminded of the importance of commitment to daily physical activity for 30 minutes or more, as able and the need to limit carbohydrate intake to 30 to 60 grams per meal to help with blood sugar control.   The need to take medication as prescribed, test blood sugar as directed, and to call between visits if there is a concern that blood sugar is uncontrolled is also discussed.   [name] is reminded of the importance of daily foot exam, annual eye examination, and good blood sugar, blood pressure and cholesterol control. Controlled, no change in medication   Diabetic Labs Latest Ref Rng & Units 09/24/2019 07/28/2019 06/10/2019 03/03/2019 01/07/2019  HbA1c <5.7 % of total Hgb - 6.9(H) - - -  Microalbumin mg/dL - - - - -  Micro/Creat Ratio <30 mcg/mg creat - - - - -  Chol <200 mg/dL - 154 - - -  HDL > OR = 50 mg/dL - 50 - - -  Calc LDL mg/dL (calc) - 89 - - -  Triglycerides <150 mg/dL - 60 - - -  Creatinine 0.44 - 1.00 mg/dL 1.14(H) - 1.04(H) 0.94 0.87   BP/Weight 09/23/2019 07/28/2019 07/01/2019 06/30/2019 06/24/2019 06/18/2019 0000000  Systolic BP 123456 A999333 0000000 [id number] 158 133 140  Diastolic BP 78 55 71 68 54 81 51  Wt. (Lbs) 261 263.8 - 260 - 258.19 266  BMI 42.13 42.58 - 41.97 - 41.67 42.93   Foot/eye exam completion dates Latest Ref Rng & Units 08/19/2019 02/24/2019  Eye Exam No Retinopathy No Retinopathy Retinopathy(A)  Foot Form Completion - - -        Morbid obesity  Patient re-educated about  the importance of commitment to a  minimum of 150 minutes of exercise per week as able.  The importance of healthy food choices with portion control discussed, as well as eating regularly and within a 12 hour window most days. The need to choose "clean , green" food 50 to 75%  of the time is discussed, as well as to make water the primary drink and set a goal of 64 ounces water daily.    Weight /BMI 09/23/2019 07/28/2019 06/30/2019  WEIGHT 261 lb 263 lb 12.8 oz 260 lb  HEIGHT 5\' 6"  5\' 6"  5\' 6"   BMI 42.13 kg/m2 42.58 kg/m2 41.97 kg/m2      Essential hypertension, benign Systolic pressure elevated ,. However pt has been vomiting, re evaluate at next visit [name] is reminded of the importance of commitment to daily physical activity for 30 minutes or more, as able and the need to limit carbohydrate intake to 30 to 60 grams per meal to help with blood sugar control.   The need to take medication as prescribed, test blood sugar as directed, and to call between visits if there is a concern that blood sugar is uncontrolled is also discussed.   [name] is reminded of the importance of daily foot exam, annual eye examination, and good blood sugar, blood pressure and cholesterol control.  Diabetic Labs Latest Ref Rng & Units 09/24/2019 07/28/2019 06/10/2019 03/03/2019 01/07/2019  HbA1c <5.7 % of total Hgb - 6.9(H) - - -  Microalbumin mg/dL - - - - -  Micro/Creat Ratio <30 mcg/mg creat - - - - -  Chol <200 mg/dL - 154 - - -  HDL > OR = 50 mg/dL - 50 - - -  Calc LDL mg/dL (calc) - 89 - - -  Triglycerides <150 mg/dL - 60 - - -  Creatinine 0.44 - 1.00 mg/dL 1.14(H) - 1.04(H) 0.94 0.87   BP/Weight 09/23/2019 07/28/2019 07/01/2019 06/30/2019 06/24/2019 06/18/2019 0000000  Systolic BP 123456 A999333 0000000 XX123456 0000000 [id number]  Diastolic BP 78 55 71 68 54 81 51  Wt. (Lbs) 261 263.8 - 260 - 258.19 266  BMI 42.13 42.58 - 41.97 - 41.67 42.93   Foot/eye exam completion dates Latest Ref Rng & Units 08/19/2019 02/24/2019  Eye Exam No Retinopathy No Retinopathy Retinopathy(A)  Foot Form Completion - - -        Acute cystitis without hematuria Nausea with mild urinary frequency and dysuria

## 2019-09-27 NOTE — Assessment & Plan Note (Signed)
Nausea with mild urinary frequency and dysuria

## 2019-09-27 NOTE — Assessment & Plan Note (Signed)
Continue current medication.

## 2019-09-27 NOTE — Assessment & Plan Note (Signed)
  Patient re-educated about  the importance of commitment to a  minimum of 150 minutes of exercise per week as able.  The importance of healthy food choices with portion control discussed, as well as eating regularly and within a 12 hour window most days. The need to choose "clean , green" food 50 to 75% of the time is discussed, as well as to make water the primary drink and set a goal of 64 ounces water daily.    Weight /BMI 09/23/2019 07/28/2019 06/30/2019  WEIGHT 261 lb 263 lb 12.8 oz 260 lb  HEIGHT 5\' 6"  5\' 6"  5\' 6"   BMI 42.13 kg/m2 42.58 kg/m2 41.97 kg/m2

## 2019-09-28 ENCOUNTER — Telehealth: Payer: Self-pay | Admitting: *Deleted

## 2019-09-28 ENCOUNTER — Telehealth: Payer: Self-pay | Admitting: Family Medicine

## 2019-09-28 DIAGNOSIS — R109 Unspecified abdominal pain: Secondary | ICD-10-CM

## 2019-09-28 LAB — IMMUNOFIXATION ELECTROPHORESIS
IgA: 291 mg/dL (ref 64–422)
IgG (Immunoglobin G), Serum: 1390 mg/dL (ref 586–1602)
IgM (Immunoglobulin M), Srm: 135 mg/dL (ref 26–217)
Total Protein ELP: 6.9 g/dL (ref 6.0–8.5)

## 2019-09-28 LAB — PROTEIN ELECTROPHORESIS, SERUM
A/G Ratio: 0.9 (ref 0.7–1.7)
Albumin ELP: 3.3 g/dL (ref 2.9–4.4)
Alpha-1-Globulin: 0.2 g/dL (ref 0.0–0.4)
Alpha-2-Globulin: 0.9 g/dL (ref 0.4–1.0)
Beta Globulin: 1 g/dL (ref 0.7–1.3)
Gamma Globulin: 1.4 g/dL (ref 0.4–1.8)
Globulin, Total: 3.5 g/dL (ref 2.2–3.9)
Total Protein ELP: 6.8 g/dL (ref 6.0–8.5)

## 2019-09-28 NOTE — Telephone Encounter (Signed)
Pt is calling she states that she is still vomitng and cant keep anything down, the Zofran is not working. The pt has a Endoscopy in the AM.   Please advise, so we can call the pt

## 2019-09-28 NOTE — Telephone Encounter (Signed)
Pt still having trouble.  Throwing up and having trouble keeping everything down including water for past 3 weeks.  Pt says she is having a capsule study tomorrow. [phone]

## 2019-09-28 NOTE — Telephone Encounter (Signed)
I spoke with pt, she states still having severe nausea, nothing is helping, has appt in am for given's capsule, advised pt to got to Ed for IV fluids and medication management if she feels light headed and weak, I advise I will send Dr Gala Romney a note also

## 2019-09-29 ENCOUNTER — Ambulatory Visit (HOSPITAL_COMMUNITY)
Admission: RE | Admit: 2019-09-29 | Discharge: 2019-09-29 | Disposition: A | Discharge status: Home / Self Care | Payer: Medicare Other | Attending: Internal Medicine | Admitting: Internal Medicine

## 2019-09-29 ENCOUNTER — Encounter (HOSPITAL_COMMUNITY)
Admission: RE | Disposition: A | Discharge status: Home / Self Care | Payer: Self-pay | Source: Home / Self Care | Attending: Internal Medicine

## 2019-09-29 ENCOUNTER — Encounter (HOSPITAL_COMMUNITY): Payer: Self-pay | Admitting: *Deleted

## 2019-09-29 DIAGNOSIS — K5520 Angiodysplasia of colon without hemorrhage: Secondary | ICD-10-CM | POA: Insufficient documentation

## 2019-09-29 DIAGNOSIS — D509 Iron deficiency anemia, unspecified: Secondary | ICD-10-CM | POA: Diagnosis not present

## 2019-09-29 DIAGNOSIS — Z7952 Long term (current) use of systemic steroids: Secondary | ICD-10-CM | POA: Diagnosis not present

## 2019-09-29 HISTORY — PX: GIVENS CAPSULE STUDY: SHX5432

## 2019-09-29 SURGERY — IMAGING PROCEDURE, GI TRACT, INTRALUMINAL, VIA CAPSULE

## 2019-09-29 MED ORDER — SODIUM CHLORIDE 0.9 % IV SOLN: INTRAVENOUS | Status: DC

## 2019-09-29 NOTE — Telephone Encounter (Signed)
The N/V is first I heard about. I see she noted in the telephone call that she is "still" having this issue. The last that I saw her was in July 2020, and she did not have those symptoms then. The capsule is being performed due to IDA to evaluate small bowel. It appears she received the capsule this morning. Is she able to tolerate liquids? If she is feeling weak, light-headed, dark urine, dehydrated, needs to go to ED. Needs further evaluation due to prolonged symptoms.

## 2019-09-29 NOTE — Telephone Encounter (Signed)
Spoke with pt. She had her capsule study this morning 09/29/2019. Pt is having c/o vomiting after eating or drinking anything. Pt is also having some pain upper abdomen under breast bone. That pain is more on pt's lt side. Pt is aware that when results from capsule study are available, we will contact her with results.

## 2019-09-29 NOTE — Telephone Encounter (Signed)
Noted  

## 2019-09-29 NOTE — Telephone Encounter (Signed)
Spoke with pt. She said those symptoms weren't discussed at her appointment. Pt states she's had these symptoms all along. Pt isn't able to keep fluids down. Pt sips water and it eventually comes back up. Pt's urine isn't dark and pt does feel dehydrated. Pt was asked to go to the ED for evaluation. Pt is going to try to drink more fluids and states that she will have a family member take her to the ED after 3 pm.

## 2019-09-30 ENCOUNTER — Encounter (HOSPITAL_COMMUNITY): Payer: Self-pay | Admitting: Internal Medicine

## 2019-10-01 ENCOUNTER — Other Ambulatory Visit: Payer: Self-pay

## 2019-10-01 ENCOUNTER — Ambulatory Visit (HOSPITAL_COMMUNITY)
Admission: RE | Admit: 2019-10-01 | Discharge: 2019-10-01 | Disposition: A | Discharge status: Home / Self Care | Payer: Medicare Other | Source: Ambulatory Visit | Attending: Gastroenterology | Admitting: Gastroenterology

## 2019-10-01 ENCOUNTER — Ambulatory Visit (HOSPITAL_COMMUNITY): Payer: Medicare Other | Admitting: Nurse Practitioner

## 2019-10-01 DIAGNOSIS — Z13810 Encounter for screening for upper gastrointestinal disorder: Secondary | ICD-10-CM | POA: Diagnosis not present

## 2019-10-01 DIAGNOSIS — R109 Unspecified abdominal pain: Secondary | ICD-10-CM | POA: Insufficient documentation

## 2019-10-01 DIAGNOSIS — Z20822 Contact with and (suspected) exposure to covid-19: Secondary | ICD-10-CM

## 2019-10-01 NOTE — Progress Notes (Deleted)
[name], [place] [id number]   CLINIC:  Medical Oncology/Hematology  PCP:  Fayrene Helper, MD [address] [place] [address] [phone]   REASON FOR VISIT: Follow-up for ***  CURRENT THERAPY: ***   INTERVAL HISTORY:  [name] 78 y.o. female returns for routine follow-up     REVIEW OF SYSTEMS:  Review of Systems - Oncology   PAST MEDICAL/SURGICAL HISTORY:  Past Medical History:  Diagnosis Date  . Anemia in chronic renal disease 12/30/2015  . Anxiety   . Arthritis    "all over" (01/19/2014)  . Arthritis, lumbar spine   . Carpal tunnel syndrome   . Chronic bronchitis ([place])    "got it q yr for awhile; hasn't had it in awhile" (01/19/2014)  . Chronic kidney disease (CKD) stage G3b/A1, moderately decreased glomerular filtration rate (GFR) between 30-44 mL/min/1.73 square meter and albuminuria creatinine ratio less than 30 mg/g (HCC) 09/14/2009   Qualifier: Diagnosis of  By: Moshe Cipro MD, Joycelyn Schmid    . Chronic renal disease, stage 3, moderately decreased glomerular filtration rate (GFR) between 30-59 mL/min/1.73 square meter (HCC) 09/14/2009   Qualifier: Diagnosis of  By: Moshe Cipro MD, Joycelyn Schmid    . Complication of anesthesia    combative  . COPD (chronic obstructive pulmonary disease) ([place])   . Depression   . Diverticulosis 07/2004   Colonscopy Dr Gala Romney  . Esophagitis, erosive 2009  . Exertional shortness of breath   . Gastroesophageal reflux   . [id number])    "usually a couple times/wk" (01/19/2014)  . Heart murmur    saw cardiology In [place], he told her she did not need to come back.  . Hyperlipidemia   . Hypertension   . Impingement syndrome, shoulder   . Iron deficiency anemia   . Mitral regurgitation   . Myasthenia gravis   . Myasthenia gravis in remission ([place]) 11/24/2014  . Myasthenia gravis in remission ([place])   . Obesity   . OSA on CPAP    Negative on last sleep study  . Pneumonia  01/2012  . Pulmonary HTN ([place])   . Schatzki's ring    Last EGD w/ dilation 02/08/11, 2009 & 2007  . Seasonal allergies   . Shoulder pain   . Thyroid disease    "used to take RX; they took me off it" (01/19/2014)  . Tobacco abuse   . Type II diabetes mellitus ([place])    Past Surgical History:  Procedure Laterality Date  . [place] VITRECTOMY WITH 20 GAUGE MVR PORT Left 01/19/2014   Procedure: 25 GAUGE PARS PLANA VITRECTOMY WITH 20 GAUGE MVR PORT; MEMBRAME PEEL; SERUM PATCH; LASER TREATMENT; C3F8;  Surgeon: Hayden Pedro, MD;  Location: [facility];  Service: Ophthalmology;  Laterality: Left;  . ABDOMINAL HYSTERECTOMY    . AGILE CAPSULE N/A 08/05/2019   Procedure: AGILE CAPSULE;  Surgeon: Daneil Dolin, MD;  [facility];  Service: Endoscopy;  Laterality: N/A;  7:30am  . APPENDECTOMY    . CARPAL TUNNEL RELEASE Bilateral   . CATARACT EXTRACTION W/PHACO Left 10/21/2013   Procedure: LEFT CATARACT EXTRACTION PHACO AND INTRAOCULAR LENS PLACEMENT (IOC);  Surgeon: Marylynn Pearson, MD;  Location: [facility];  Service: Ophthalmology;  Laterality: Left;  . CHOLECYSTECTOMY    . COLONOSCOPY  05/08/2012   [id number] and external hemorrhoids; colonic diverticulosis  . COLONOSCOPY WITH PROPOFOL N/A 02/26/2019   two simple adenomas, diverticulosis,  and internal hemorrhoids.   Marland Kitchen  ESOPHAGEAL DILATION     "more than 3 times" (01/19/2014)  . ESOPHAGOGASTRODUODENOSCOPY  02/08/11   [place]-Distal esophageal erosion consistent with mild erosive reflux   esophagitis/ Noncritical Schatzki ring, small hiatal hernia otherwise upper/ gastrointestinal tract appeared unremarkable, status post passage  of a Maloney dilation to biopsy disruption of the ring described  . ESOPHAGOGASTRODUODENOSCOPY  04/2012   2 tandem incomplete distal esophagea rings s/p dilation.   . ESOPHAGOGASTRODUODENOSCOPY N/A 09/16/2014   Dr. Gala Romney: Schatzki ring status post dilation/disruption.  Hiatal hernia.  Marland Kitchen ESOPHAGOGASTRODUODENOSCOPY (EGD)  WITH PROPOFOL N/A 12/08/2018   EGD with mild Schatzki ring s/p dilation, small hiatal hernia, otherwise normal  . EYE SURGERY     cataracts, bilateral  . GIVENS CAPSULE STUDY N/A 09/29/2019   Procedure: GIVENS CAPSULE STUDY;  Surgeon: Daneil Dolin, MD;  [facility];  Service: Endoscopy;  Laterality: N/A;  7:30am  . INCONTINENCE SURGERY  08/26/09   [place]  . JOINT REPLACEMENT     right total knee  . MALONEY DILATION N/A 09/16/2014   Procedure: Venia Minks DILATION;  Surgeon: Daneil Dolin, MD;  [facility];  Service: Endoscopy;  Laterality: N/A;  . Venia Minks DILATION N/A 12/08/2018   Procedure: Venia Minks DILATION;  Surgeon: Daneil Dolin, MD;  [facility];  Service: Endoscopy;  Laterality: N/A;  . PARS PLANA VITRECTOMY W/ REPAIR OF MACULAR HOLE Left 01/19/2014  . POLYPECTOMY  02/26/2019   Procedure: POLYPECTOMY;  Surgeon: Danie Binder, MD;  [facility];  Service: Endoscopy;;  ascending colon polyps x2  . SAVORY DILATION N/A 09/16/2014   Procedure: SAVORY DILATION;  Surgeon: Daneil Dolin, MD;  [facility];  Service: Endoscopy;  Laterality: N/A;  . TONSILLECTOMY    . TOTAL KNEE ARTHROPLASTY Right 05/13/07   Dr. Aline Brochure  . TRIGGER FINGER RELEASE Right 06/26/2018   Procedure: RIGHT INDEX FINGER TRIGGER FINGER/A-1 PULLEY RELEASE;  Surgeon: Carole Civil, MD;  [facility];  Service: Orthopedics;  Laterality: Right;     SOCIAL HISTORY:  Social History   Socioeconomic History  . Marital status: Single    Spouse name: Not on file  . Number of children: 2  . Years of education: Not on file  . Highest education level: Not on file  Occupational History  . Occupation: disabled    Employer: RETIRED  Social Needs  . Financial resource strain: Not very hard  . Food insecurity    Worry: Never true    Inability: Never true  . Transportation needs    Medical: Yes    Non-medical: No  Tobacco Use  . Smoking status: Former  Smoker    Packs/day: 0.50    Years: 25.00    Pack years: 12.50    Types: Cigarettes    Quit date: 01/01/2004    Years since quitting: 15.7  . Smokeless tobacco: Never Used  Substance and Sexual Activity  . Alcohol use: No  . Drug use: No  . Sexual activity: Never    Birth control/protection: Surgical  Lifestyle  . Physical activity    Days per week: 0 days    Minutes per session: 0 min  . Stress: Only a little  Relationships  . Social connections    Talks on phone: More than three times a week    Gets together: Never    Attends religious service: Never    Active member of club or organization: No    Attends meetings of clubs  or organizations: Never    Relationship status: Never married  . Intimate partner violence    Fear of current or ex partner: No    Emotionally abused: No    Physically abused: No    Forced sexual activity: No  Other Topics Concern  . Not on file  Social History Narrative   Patient lives at home by herself   Patient drinks one cup of coffee a day   Patient is right handed.    FAMILY HISTORY:  Family History  Problem Relation Age of Onset  . Cancer Mother   . Heart disease Mother   . Cancer Father   . Heart disease Father   . Diabetes Sister   . Hypertension Brother   . Heart disease Brother   . Cancer Sister   . Kidney failure Sister   . Diabetes Son   . Hypertension Son   . Hypertension Daughter   . Colon cancer Neg Hx     CURRENT MEDICATIONS:  Outpatient Encounter Medications as of 10/01/2019  Medication Sig  . ACETAMINOPHEN EXTRA STRENGTH 500 MG tablet TAKE (1) TABLET BY MOUTH AT BEDTIME.  Marland Kitchen amLODipine (NORVASC) 10 MG tablet TAKE 1 TABLET BY MOUTH ONCE A DAY.  Marland Kitchen B-D ULTRAFINE III SHORT PEN 31G X 8 MM MISC USE ONCE DAILY WITH LANTUS SOLOSTAR PEN.  . brimonidine-timolol (COMBIGAN) 0.2-0.5 % ophthalmic solution Place 1 drop into both eyes every 12 (twelve) hours.  . cetirizine (ZYRTEC) 10 MG tablet TAKE 1 TABLET BY MOUTH ONCE A DAY.  Marland Kitchen  Cholecalciferol (VITAMIN D3) 2000 units TABS Take 2,000 Units by mouth at bedtime.  . citalopram (CELEXA) 20 MG tablet TAKE 1 TABLET BY MOUTH ONCE A DAY.  Marland Kitchen Cod Liver Oil CAPS Take by mouth. 500 MG per patient  . Cranberry 1000 MG CAPS Take 1,000 mg by mouth daily.  . cycloSPORINE (RESTASIS) 0.05 % ophthalmic emulsion Place 1 drop into both eyes 2 (two) times daily.   Marland Kitchen diltiazem (CARDIZEM CD) 240 MG 24 hr capsule TAKE (1) CAPSULE BY MOUTH TWICE DAILY.  Marland Kitchen docusate sodium (COLACE) 100 MG capsule Take 100 mg by mouth at bedtime.  Marland Kitchen ezetimibe (ZETIA) 10 MG tablet   . ezetimibe (ZETIA) 10 MG tablet TAKE 1 TABLET BY MOUTH ONCE A DAY.  Marland Kitchen Flaxseed, Linseed, (FLAXSEED OIL) 1000 MG CAPS Take 1,000 mg by mouth daily.  . fluticasone (FLONASE) 50 MCG/ACT nasal spray PLACE 2 SPRAYS INTO BOTH NOSTRILS DAILY. (Patient taking differently: Place 2 sprays into both nostrils as needed. )  . gabapentin (NEURONTIN) 300 MG capsule Take 1 capsule (300 mg total) by mouth 2 (two) times daily.  Marland Kitchen gabapentin (NEURONTIN) 300 MG capsule   . gabapentin (NEURONTIN) 300 MG capsule TAKE (1) CAPSULE BY MOUTH TWICE DAILY.  Marland Kitchen glucose blood test strip   . hydrALAZINE (APRESOLINE) 50 MG tablet TAKE 1 TABLET BY MOUTH THREE TIMES A DAY.  Marland Kitchen LANTUS SOLOSTAR 100 UNIT/ML Solostar Pen INJECT 30 UNITS SUBCUTANEOUSLY ONCE DAILY. (Patient taking differently: Inject 30 Units into the skin daily. )  . losartan (COZAAR) 100 MG tablet TAKE (1) TABLET BY MOUTH DAILY.  . Multiple Vitamin (MULTIVITAMIN WITH MINERALS) TABS tablet Take 1 tablet by mouth at bedtime.  . ondansetron (ZOFRAN) 4 MG tablet TAKE (1) TABLET BY MOUTH EVERY FOUR HOURS AS NEEDED FOR NAUSEA.  Marland Kitchen ondansetron (ZOFRAN-ODT) 8 MG disintegrating tablet Take 1 tablet (8 mg total) by mouth every 8 (eight) hours as needed for nausea or vomiting.  Marland Kitchen  ONETOUCH DELICA LANCETS 99991111 MISC USE AS DIRECTED 3 TIMES DAILY FOR BLOOD GLUCOSE TESTING.  . pantoprazole (PROTONIX) 40 MG tablet TAKE 1  TABLET BY MOUTH TWICE DAILY BEFORE A MEAL.  Marland Kitchen polyethylene glycol powder (GLYCOLAX/MIRALAX) powder MIX 17G (1 CAPFUL) IN 8 OUNCES OF JUICE OR WATER AND DRINK ONCE DAILY.  Marland Kitchen predniSONE (DELTASONE) 5 MG tablet Take 1 tablet (5 mg total) by mouth daily.  Marland Kitchen PROAIR HFA 108 (90 Base) MCG/ACT inhaler INHALE 2 PUFFS BY MOUTH EVERY 6 HOURS AS NEEDED .  Marland Kitchen pyridostigmine (MESTINON) 60 MG tablet TAKE (1) TABLET BY MOUTH THREE TIMES A DAY.  . rosuvastatin (CRESTOR) 40 MG tablet TAKE (1) TABLET BY MOUTH AT BEDTIME.  Marland Kitchen solifenacin (VESICARE) 10 MG tablet TAKE 1 TABLET BY MOUTH ONCE A DAY.  Marland Kitchen spironolactone (ALDACTONE) 25 MG tablet TAKE 1 TABLET BY MOUTH ONCE A DAY.  Marland Kitchen spironolactone-hydrochlorothiazide (ALDACTAZIDE) 25-25 MG tablet   . TRADJENTA 5 MG TABS tablet TAKE 1 TABLET BY MOUTH ONCE A DAY.  . TRADJENTA 5 MG TABS tablet   . TRADJENTA 5 MG TABS tablet TAKE 1 TABLET BY MOUTH ONCE A DAY.  Marland Kitchen UNABLE TO FIND Diabetic shoes x 1 Inserts x 3 DX E11.9  . venlafaxine XR (EFFEXOR-XR) 150 MG 24 hr capsule TAKE 1 CAPSULE BY MOUTH DAILY.   No facility-administered encounter medications on file as of 10/01/2019.     ALLERGIES:  No Known Allergies   PHYSICAL EXAM:  ECOG Performance status: 1  There were no vitals filed for this visit. There were no vitals filed for this visit.  Physical Exam   LABORATORY DATA:  I have reviewed the labs as listed.  CBC    Component Value Date/Time   WBC 8.4 09/24/2019 1328   RBC 3.80 (L) 09/24/2019 1328   HGB 11.0 (L) 09/24/2019 1328   HCT 36.7 09/24/2019 1328   PLT 305 09/24/2019 1328   MCV 96.6 09/24/2019 1328   MCH 28.9 09/24/2019 1328   MCHC 30.0 09/24/2019 1328   RDW 16.9 (H) 09/24/2019 1328   LYMPHSABS 1.4 09/24/2019 1328   MONOABS 0.8 09/24/2019 1328   EOSABS 0.3 09/24/2019 1328   BASOSABS 0.0 09/24/2019 1328   CMP Latest Ref Rng & Units 09/24/2019 06/10/2019 03/03/2019  Glucose 70 - 99 mg/dL 230(H) 159(H) 102(H)  BUN 8 - 23 mg/dL 23 18 15   Creatinine  0.44 - 1.00 mg/dL 1.14(H) 1.04(H) 0.94  Sodium 135 - 145 mmol/L 142 137 141  Potassium 3.5 - 5.1 mmol/L 3.7 3.7 4.0  Chloride 98 - 111 mmol/L 106 108 106  CO2 22 - 32 mmol/L 27 20(L) 28  Calcium 8.9 - 10.3 mg/dL 9.1 8.6(L) 9.2  Total Protein 6.5 - 8.1 g/dL 7.4 7.0 7.3  Total Bilirubin 0.3 - 1.2 mg/dL 0.6 0.2(L) 0.5  Alkaline Phos 38 - 126 U/L 141(H) 133(H) 135(H)  AST 15 - 41 U/L 18 18 20   ALT 0 - 44 U/L 16 15 17        DIAGNOSTIC IMAGING:  I have independently reviewed the scans and discussed with the patient.   I have reviewed Francene Finders, NP's note and agree with the documentation.  I personally performed a face-to-face visit, made revisions and my assessment and plan is as follows.    ASSESSMENT & PLAN:   Monoclonal gammopathy of unknown significance (MGUS) 1.  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 - Last colonoscopy  on 02/26/2019 showed normal terminal ileum, 2 tubular adenomas in the proximal ascending colon removed, diverticulosis in the ascending, descending, sigmoid and rectosigmoid colon, external and internal hemorrhoids. -Patient denies any bright red bleeding per rectum or melena. -Labs done on 06/10/2019 showed her hemoglobin 9.9, ferritin 61, percent saturation 18, creatinine 1.04 - We will see her back in 3 months with repeat blood work.  2.  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  However immunofixation in March and November 2018 was positive for IgA kappa. -Free light chain ratio is normal, normal calcium.   -No other intervention necessary at this time. -Labs done on 06/10/2019 showed no M spike.  Light chain ratio 1.52. - He is having pain in her hips and back.  Have ordered a skeletal survey prior to her next visit. -We will see her back in 3 months.      Orders placed this encounter:  No orders of the defined types were  placed in this encounter.     Francene Finders, FNP-C [place] [phone]

## 2019-10-01 NOTE — Telephone Encounter (Signed)
Please let patient know I am reviewing her capsule; I believe it did make it to the cecum but not able to definitively say. We need to have her complete an abdominal xray today. It can be a walk-in and doesn't need scheduling. We will make sure the capsule is passed.

## 2019-10-01 NOTE — Telephone Encounter (Signed)
Tried to call pt home number and mobile number, no answer, left message at both numbers for return call.

## 2019-10-01 NOTE — Addendum Note (Signed)
Addended by: Annitta Needs on: 10/01/2019 02:00 PM   Modules accepted: Orders

## 2019-10-01 NOTE — Telephone Encounter (Signed)
Pt called office, informed her to go to [facility] radiology today for x-ray.

## 2019-10-01 NOTE — Assessment & Plan Note (Deleted)
1.  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 - Last colonoscopy on 02/26/2019 showed normal terminal ileum, 2 tubular adenomas in the proximal ascending colon removed, diverticulosis in the ascending, descending, sigmoid and rectosigmoid colon, external and internal hemorrhoids. -Patient denies any bright red bleeding per rectum or melena. -Labs done on 06/10/2019 showed her hemoglobin 9.9, ferritin 61, percent saturation 18, creatinine 1.04 - We will see her back in 3 months with repeat blood work.  2.  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  However immunofixation in March and November 2018 was positive for IgA kappa. -Free light chain ratio is normal, normal calcium.   -No other intervention necessary at this time. -Labs done on 06/10/2019 showed no M spike.  Light chain ratio 1.52. - He is having pain in her hips and back.  Have ordered a skeletal survey prior to her next visit. -We will see her back in 3 months. 

## 2019-10-01 NOTE — Telephone Encounter (Signed)
As of note: patient denied any vomiting to endo nursing staff yesterday. Will need to sort this out further. Agree with ED evaluation if unable to tolerate liquids.

## 2019-10-02 ENCOUNTER — Telehealth: Payer: Self-pay | Admitting: *Deleted

## 2019-10-02 ENCOUNTER — Other Ambulatory Visit: Payer: Self-pay

## 2019-10-02 ENCOUNTER — Other Ambulatory Visit: Payer: Self-pay | Admitting: Family Medicine

## 2019-10-02 ENCOUNTER — Telehealth: Payer: Self-pay | Admitting: Gastroenterology

## 2019-10-02 DIAGNOSIS — T184XXD Foreign body in colon, subsequent encounter: Secondary | ICD-10-CM

## 2019-10-02 LAB — NOVEL CORONAVIRUS, NAA: SARS-CoV-2, NAA: NOT DETECTED

## 2019-10-02 MED ORDER — HYDROCODONE-ACETAMINOPHEN 10-325 MG PO TABS: ORAL_TABLET | ORAL | 0 refills | Status: DC

## 2019-10-02 MED ORDER — HYDROCODONE-ACETAMINOPHEN 10-325 MG PO TABS: ORAL_TABLET | ORAL | 0 refills | Status: AC

## 2019-10-02 NOTE — Telephone Encounter (Signed)
Pt said her hydrocodone was never sent in to the pharmacy. This can be sent to [facility].

## 2019-10-02 NOTE — Procedures (Signed)
Small Bowel [place] Capsule Study Procedure date:  09/29/19  Referring Provider:  Roseanne Kaufman, NP/Dr. Gala Romney  PCP:  Dr. Moshe Cipro, Norwood Levo, MD  Indication for procedure:   78 year old female with IDA, s/p EGD in Dec 2019 with mild Schatzki ring s/p dilation, small hiatal hernia, otherwise normal, and colonoscopy Feb 2020 with two simple adenomas, diverticulosis, and hemorrhoids, now with persistent drifting ferritin, need for continued IV iron. Now capsule study to assess small bowel.    Findings:   Capsule study appears to possibly reach the cecum but unable to definitively identify. No obvious small bowel tumor, lesion, or mass. In proximal small bowel, several AVMs noted and possible erosions, non-bleeding. Further downstream, small bowel appeared dilated but no mass or lesion. Mid to distal small bowel were interspersed images of what appeared to be tinted red but no obvious source or definite blood.   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 No obvious mass, lesion. Abdominal xray ordered to ensure capsule has passed; I anticipate it reached the cecum but unable to definitely say with image quality. EGD and colonoscopy are up-to-date. Continue to follow with Hematology. She is on chronic low dose prednisone therapy but recommend avoidance of all NSAIDs if at all possible. Continue PPI therapy. Will see her as outpatient in follow-up.   Annitta Needs, PhD, ANP-BC [facility]

## 2019-10-02 NOTE — Progress Notes (Signed)
Reviewed with Dr. Thornton Papas. The capsule may be in transverse colon vs small bowel. On images, it is possible it made to the cecum but not definite. We need to repeat an xray Monday or 15-Apr-2023 to make sure it has passed. I anticipate it is likely in the colon now, but we need to make sure. How is nausea/vomiting? I will be seeing her next week.

## 2019-10-02 NOTE — Telephone Encounter (Signed)
[name]: please let patient know that capsule study was read officially. She has a few small AVMs, ?erosion. No obvious mass or lesion. Is she taking prednisone daily? Needs to continue PPI therapy. I will see her later this month. Continue follow-up with Hematology.

## 2019-10-05 NOTE — Telephone Encounter (Signed)
Yes, still needs xray.

## 2019-10-05 NOTE — Telephone Encounter (Signed)
Spoke with pt. She is taking Prednisone daily and will continue her therapy. Pt wants to know if she still needs go and have xray look to see if the pill has passed?

## 2019-10-05 NOTE — Telephone Encounter (Signed)
Noted. Pt notified that she will still need the Xray.

## 2019-10-06 ENCOUNTER — Other Ambulatory Visit: Payer: Self-pay

## 2019-10-06 ENCOUNTER — Ambulatory Visit (HOSPITAL_COMMUNITY)
Admission: RE | Admit: 2019-10-06 | Discharge: 2019-10-06 | Disposition: A | Discharge status: Home / Self Care | Payer: Medicare Other | Source: Ambulatory Visit | Attending: Gastroenterology | Admitting: Gastroenterology

## 2019-10-06 DIAGNOSIS — K5900 Constipation, unspecified: Secondary | ICD-10-CM | POA: Diagnosis not present

## 2019-10-06 DIAGNOSIS — T184XXD Foreign body in colon, subsequent encounter: Secondary | ICD-10-CM | POA: Diagnosis not present

## 2019-10-07 NOTE — Progress Notes (Signed)
She passed the capsule. Keep upcoming appt! Looks like she is dealing with constipation. Please provide Linzess 145 mcg samples. Take 1 capsule each morning, 30 minutes before breakfast.

## 2019-10-08 ENCOUNTER — Ambulatory Visit: Payer: Medicare Other | Admitting: Gastroenterology

## 2019-10-16 ENCOUNTER — Other Ambulatory Visit: Payer: Self-pay | Admitting: Family Medicine

## 2019-10-16 ENCOUNTER — Other Ambulatory Visit: Payer: Self-pay | Admitting: Gastroenterology

## 2019-10-20 ENCOUNTER — Other Ambulatory Visit: Payer: Self-pay

## 2019-10-20 ENCOUNTER — Encounter (INDEPENDENT_AMBULATORY_CARE_PROVIDER_SITE_OTHER): Payer: Self-pay

## 2019-10-20 ENCOUNTER — Ambulatory Visit (INDEPENDENT_AMBULATORY_CARE_PROVIDER_SITE_OTHER): Payer: Medicare Other

## 2019-10-20 DIAGNOSIS — Z23 Encounter for immunization: Secondary | ICD-10-CM | POA: Diagnosis not present

## 2019-10-20 NOTE — Progress Notes (Signed)
Referring Provider: Fayrene Helper, MD Primary Care Physician:  Fayrene Helper, MD Primary GI: [facility]    Chief Complaint  Patient presents with  . Follow-up    IDA,constipation    HPI:   [name] is a 78 y.o. 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 Prior evaluation with EGD in Dec 2019 with mild Schatzki ring s/p dilation, small hiatal hernia, otherwise normal, and colonoscopy Feb 2020 with two simple adenomas, diverticulosis, and hemorrhoids, capsule study most recently with small bowel AVMS and erosions. Followed by Hematology.   Constipation: Linzess 145 mcg did well. Amitiza hasn't helped in the past. BMs not that productive but still having BMs.   No overt GI bleeding. When eating, feels like her stomach swells some. No pain. Had several weeks of N/V. Now resolved. PPI daily.   Isolated alk phos elevated chronically.    Past Medical History:  Diagnosis Date  . Anemia in chronic renal disease 12/30/2015  . Anxiety   . Arthritis    "all over" (01/19/2014)  . Arthritis, lumbar spine   . Carpal tunnel syndrome   . Chronic bronchitis ([place])    "got it q yr for awhile; hasn't had it in awhile" (01/19/2014)  . Chronic kidney disease (CKD) stage G3b/A1, moderately decreased glomerular filtration rate (GFR) between 30-44 mL/min/1.73 square meter and albuminuria creatinine ratio less than 30 mg/g 09/14/2009   Qualifier: Diagnosis of  By: Moshe Cipro MD, Joycelyn Schmid    . Chronic renal disease, stage 3, moderately decreased glomerular filtration rate (GFR) between 30-59 mL/min/1.73 square meter 09/14/2009   Qualifier: Diagnosis of  By: Moshe Cipro MD, Joycelyn Schmid    . Complication of anesthesia    combative  . COPD (chronic obstructive pulmonary disease) ([place])   . Depression   . Diverticulosis 07/2004   Colonscopy Dr Gala Romney  . Esophagitis, erosive 2009  . Exertional shortness of breath   . Gastroesophageal reflux    . HUDJSHFW(263.7)    "usually a couple times/wk" (01/19/2014)  . Heart murmur    saw cardiology In [place], he told her she did not need to come back.  . Hyperlipidemia   . Hypertension   . Impingement syndrome, shoulder   . Iron deficiency anemia   . Mitral regurgitation   . Myasthenia gravis   . Myasthenia gravis in remission ([place]) 11/24/2014  . Myasthenia gravis in remission ([place])   . Obesity   . OSA on CPAP    Negative on last sleep study  . Pneumonia 01/2012  . Pulmonary HTN ([place])   . Schatzki's ring    Last EGD w/ dilation 02/08/11, 2009 & 2007  . Seasonal allergies   . Shoulder pain   . Thyroid disease    "used to take RX; they took me off it" (01/19/2014)  . Tobacco abuse   . Type II diabetes mellitus ([place])     Past Surgical History:  Procedure Laterality Date  . [place] VITRECTOMY WITH 20 GAUGE MVR PORT Left 01/19/2014   Procedure: 25 GAUGE PARS PLANA VITRECTOMY WITH 20 GAUGE MVR PORT; MEMBRAME PEEL; SERUM PATCH; LASER TREATMENT; C3F8;  Surgeon: Hayden Pedro, MD;  Location: [facility];  Service: Ophthalmology;  Laterality: Left;  . ABDOMINAL HYSTERECTOMY    . AGILE CAPSULE N/A 08/05/2019   Procedure: AGILE CAPSULE;  Surgeon: Daneil Dolin, MD;  [facility];  Service: Endoscopy;  Laterality: N/A;  7:30am  . APPENDECTOMY    . CARPAL TUNNEL RELEASE Bilateral   . CATARACT EXTRACTION W/PHACO Left 10/21/2013   Procedure: LEFT CATARACT EXTRACTION PHACO AND INTRAOCULAR LENS PLACEMENT (IOC);  Surgeon: Marylynn Pearson, MD;  Location: [facility];  Service: Ophthalmology;  Laterality: Left;  . CHOLECYSTECTOMY    . COLONOSCOPY  05/08/2012   YQI:[id number] and external hemorrhoids; colonic diverticulosis  . COLONOSCOPY WITH PROPOFOL N/A 02/26/2019   two simple adenomas, diverticulosis,  and internal hemorrhoids.   . ESOPHAGEAL DILATION     "more than 3 times" (01/19/2014)  . ESOPHAGOGASTRODUODENOSCOPY  02/08/11   [facility]-Distal esophageal erosion consistent with mild erosive  reflux   esophagitis/ Noncritical Schatzki ring, small hiatal hernia otherwise upper/ gastrointestinal tract appeared unremarkable, status post passage  of a Maloney dilation to biopsy disruption of the ring described  . ESOPHAGOGASTRODUODENOSCOPY  04/2012   2 tandem incomplete distal esophagea rings s/p dilation.   . ESOPHAGOGASTRODUODENOSCOPY N/A 09/16/2014   Dr. Gala Romney: Schatzki ring status post dilation/disruption.  Hiatal hernia.  Marland Kitchen ESOPHAGOGASTRODUODENOSCOPY (EGD) WITH PROPOFOL N/A 12/08/2018   EGD with mild Schatzki ring s/p dilation, small hiatal hernia, otherwise normal  . EYE SURGERY     cataracts, bilateral  . GIVENS CAPSULE STUDY N/A 09/29/2019   Procedure: GIVENS CAPSULE STUDY;  Surgeon: Daneil Dolin, MD;  [facility];  Service: Endoscopy;  Laterality: N/A;  7:30am  . INCONTINENCE SURGERY  08/26/09   [place]  . JOINT REPLACEMENT     right total knee  . MALONEY DILATION N/A 09/16/2014   Procedure: Venia Minks DILATION;  Surgeon: Daneil Dolin, MD;  [facility];  Service: Endoscopy;  Laterality: N/A;  . Venia Minks DILATION N/A 12/08/2018   Procedure: Venia Minks DILATION;  Surgeon: Daneil Dolin, MD;  [facility];  Service: Endoscopy;  Laterality: N/A;  . PARS PLANA VITRECTOMY W/ REPAIR OF MACULAR HOLE Left 01/19/2014  . POLYPECTOMY  02/26/2019   Procedure: POLYPECTOMY;  Surgeon: Danie Binder, MD;  [facility];  Service: Endoscopy;;  ascending colon polyps x2  . SAVORY DILATION N/A 09/16/2014   Procedure: SAVORY DILATION;  Surgeon: Daneil Dolin, MD;  [facility];  Service: Endoscopy;  Laterality: N/A;  . TONSILLECTOMY    . TOTAL KNEE ARTHROPLASTY Right 05/13/07   Dr. Aline Brochure  . TRIGGER FINGER RELEASE Right 06/26/2018   Procedure: RIGHT INDEX FINGER TRIGGER FINGER/A-1 PULLEY RELEASE;  Surgeon: Carole Civil, MD;  [facility];  Service: Orthopedics;  Laterality: Right;    Current Outpatient Medications  Medication  Sig Dispense Refill  . ACETAMINOPHEN EXTRA STRENGTH 500 MG tablet TAKE (1) TABLET BY MOUTH AT BEDTIME. (Patient taking differently: as needed. ) 30 tablet 0  . amLODipine (NORVASC) 10 MG tablet TAKE 1 TABLET BY MOUTH ONCE A DAY. 30 tablet 0  . B-D ULTRAFINE III SHORT PEN 31G X 8 MM MISC USE ONCE DAILY WITH LANTUS SOLOSTAR PEN. 100 each 0  . brimonidine-timolol (COMBIGAN) 0.2-0.5 % ophthalmic solution Place 1 drop into both eyes every 12 (twelve) hours.    . cetirizine (ZYRTEC) 10 MG tablet TAKE 1 TABLET BY MOUTH ONCE A DAY. 30 tablet 0  . Cholecalciferol (VITAMIN D3) 2000 units TABS Take 2,000 Units by mouth at bedtime.    . citalopram (CELEXA) 20 MG tablet TAKE 1 TABLET BY MOUTH ONCE A DAY. 30 tablet 0  . Cod Liver Oil CAPS Take by mouth. 500 MG per patient    . Cranberry 1000  MG CAPS Take 1,000 mg by mouth daily.    . cycloSPORINE (RESTASIS) 0.05 % ophthalmic emulsion Place 1 drop into both eyes 2 (two) times daily.     Marland Kitchen diltiazem (CARDIZEM CD) 240 MG 24 hr capsule TAKE (1) CAPSULE BY MOUTH TWICE DAILY. 60 capsule 0  . ezetimibe (ZETIA) 10 MG tablet TAKE 1 TABLET BY MOUTH ONCE A DAY. 30 tablet 2  . fluticasone (FLONASE) 50 MCG/ACT nasal spray PLACE 2 SPRAYS INTO BOTH NOSTRILS DAILY. (Patient taking differently: Place 2 sprays into both nostrils as needed. ) 16 g 0  . gabapentin (NEURONTIN) 300 MG capsule Take 1 capsule (300 mg total) by mouth 2 (two) times daily. 60 capsule 5  . glucose blood test strip     . hydrALAZINE (APRESOLINE) 50 MG tablet TAKE 1 TABLET BY MOUTH THREE TIMES A DAY. 90 tablet 0  . HYDROcodone-acetaminophen (NORCO) 10-325 MG tablet Take one tablet by mouth two times daily for chronic back pain 60 tablet 0  . LANTUS SOLOSTAR 100 UNIT/ML Solostar Pen INJECT 30 UNITS SUBCUTANEOUSLY ONCE DAILY. (Patient taking differently: Inject 30 Units into the skin daily. ) 15 mL 5  . losartan (COZAAR) 100 MG tablet TAKE (1) TABLET BY MOUTH DAILY. 30 tablet 0  . Multiple Vitamin  (MULTIVITAMIN WITH MINERALS) TABS tablet Take 1 tablet by mouth at bedtime.    . ondansetron (ZOFRAN) 4 MG tablet TAKE (1) TABLET BY MOUTH EVERY FOUR HOURS AS NEEDED FOR NAUSEA. 30 tablet 0  . ondansetron (ZOFRAN-ODT) 8 MG disintegrating tablet Take 1 tablet (8 mg total) by mouth every 8 (eight) hours as needed for nausea or vomiting. 30 tablet 0  . ONETOUCH DELICA LANCETS 57Q MISC USE AS DIRECTED 3 TIMES DAILY FOR BLOOD GLUCOSE TESTING. 100 each 5  . pantoprazole (PROTONIX) 40 MG tablet Take 1 tablet (40 mg total) by mouth daily. 30 minutes before breakfast. 90 tablet 3  . predniSONE (DELTASONE) 5 MG tablet Take 1 tablet (5 mg total) by mouth daily. 30 tablet 0  . PROAIR HFA 108 (90 Base) MCG/ACT inhaler INHALE 2 PUFFS BY MOUTH EVERY 6 HOURS AS NEEDED . 8.5 g 0  . pyridostigmine (MESTINON) 60 MG tablet TAKE (1) TABLET BY MOUTH THREE TIMES A DAY. 100 tablet 0  . rosuvastatin (CRESTOR) 40 MG tablet TAKE (1) TABLET BY MOUTH AT BEDTIME. 30 tablet 0  . solifenacin (VESICARE) 10 MG tablet TAKE 1 TABLET BY MOUTH ONCE A DAY. 30 tablet 0  . spironolactone (ALDACTONE) 25 MG tablet TAKE 1 TABLET BY MOUTH ONCE A DAY. 30 tablet 0  . TRADJENTA 5 MG TABS tablet TAKE 1 TABLET BY MOUTH ONCE A DAY. 30 tablet 3  . UNABLE TO FIND Diabetic shoes x 1 Inserts x 3 DX E11.9 1 each 0  . venlafaxine XR (EFFEXOR-XR) 150 MG 24 hr capsule TAKE 1 CAPSULE BY MOUTH DAILY. 30 capsule 2  . linaclotide (LINZESS) 145 MCG CAPS capsule Take 1 capsule (145 mcg total) by mouth daily before breakfast. 90 capsule 3   No current facility-administered medications for this visit.     Allergies as of 10/21/2019  . (No Known Allergies)    Family History  Problem Relation Age of Onset  . Cancer Mother   . Heart disease Mother   . Cancer Father   . Heart disease Father   . Diabetes Sister   . Hypertension Brother   . Heart disease Brother   . Cancer Sister   . Kidney failure Sister   .  Diabetes Son   . Hypertension Son   .  Hypertension Daughter   . Colon cancer Neg Hx     Social History   Socioeconomic History  . Marital status: Single    Spouse name: Not on file  . Number of children: 2  . Years of education: Not on file  . Highest education level: Not on file  Occupational History  . Occupation: disabled    Employer: RETIRED  Social Needs  . Financial resource strain: Not very hard  . Food insecurity    Worry: Never true    Inability: Never true  . Transportation needs    Medical: Yes    Non-medical: No  Tobacco Use  . Smoking status: Former Smoker    Packs/day: 0.50    Years: 25.00    Pack years: 12.50    Types: Cigarettes    Quit date: 01/01/2004    Years since quitting: 15.8  . Smokeless tobacco: Never Used  Substance and Sexual Activity  . Alcohol use: No  . Drug use: No  . Sexual activity: Never    Birth control/protection: Surgical  Lifestyle  . Physical activity    Days per week: 0 days    Minutes per session: 0 min  . Stress: Only a little  Relationships  . Social Herbalist on phone: More than three times a week    Gets together: Never    Attends religious service: Never    Active member of club or organization: No    Attends meetings of clubs or organizations: Never    Relationship status: Never married  Other Topics Concern  . Not on file  Social History Narrative   Patient lives at home by herself   Patient drinks one cup of coffee a day   Patient is right handed.    Review of Systems: Gen: Denies fever, chills, anorexia. Denies fatigue, weakness, weight loss.  CV: Denies chest pain, palpitations, syncope, peripheral edema, and claudication. Resp: Denies dyspnea at rest, cough, wheezing, coughing up blood, and pleurisy. GI: see HPI Derm: Denies rash, itching, dry skin Psych: Denies depression, anxiety, memory loss, confusion. No homicidal or suicidal ideation.  Heme: Denies bruising, bleeding, and enlarged lymph nodes.  Physical Exam: BP (!)  189/75   Pulse 60   Temp 98.7 F (37.1 C)   Ht _0  (1.676 m)   Wt 264 lb (119.7 kg)   BMI 42.61 kg/m  General:   Alert and oriented. No distress noted. Pleasant and cooperative.  Head:  Normocephalic and atraumatic. Abdomen:  +BS, soft, non-tender and non-distended. No rebound or guarding. No HSM or masses noted. Msk:  Symmetrical without gross deformities. Normal posture. Extremities:  Without edema. Neurologic:  Alert and  oriented x4 Psych:  Alert and cooperative. Normal mood and affect.  ASSESSMENT: Very pleasant 78 year old female with history of IDA s/p thorough evaluation as noted in HPI, continues to follow with Hematology, with improving labs. No overt GI bleeding. Constipation well managed with Linzess 145 mcg, which we will send to pharmacy. As of note, chronically mildly elevated alk phos and normal transaminases. Will repeat with GGT and HFP in Jan 2020. [place] on file from Jan 2020.    PLAN:  Linzess 145 mcg daily Continue Protonix daily Continue follow-up with Hematology Recheck HFP and GGT in Jan 2021 Return in 6 months   Annitta Needs, PhD, [facility]

## 2019-10-21 ENCOUNTER — Ambulatory Visit: Payer: Medicare Other | Admitting: Gastroenterology

## 2019-10-21 ENCOUNTER — Encounter: Payer: Self-pay | Admitting: Gastroenterology

## 2019-10-21 VITALS — BP 189/75 | HR 60 | Temp 98.7°F | Ht 66.0 in | Wt 264.0 lb

## 2019-10-21 DIAGNOSIS — D508 Other iron deficiency anemias: Secondary | ICD-10-CM | POA: Diagnosis not present

## 2019-10-21 DIAGNOSIS — R748 Abnormal levels of other serum enzymes: Secondary | ICD-10-CM

## 2019-10-21 DIAGNOSIS — K5900 Constipation, unspecified: Secondary | ICD-10-CM | POA: Diagnosis not present

## 2019-10-21 DIAGNOSIS — K219 Gastro-esophageal reflux disease without esophagitis: Secondary | ICD-10-CM

## 2019-10-21 MED ORDER — LINACLOTIDE 145 MCG PO CAPS: 145.0000 ug | ORAL_CAPSULE | Freq: Every day | ORAL | 3 refills | Status: DC

## 2019-10-21 MED ORDER — PANTOPRAZOLE SODIUM 40 MG PO TBEC: 40.0000 mg | DELAYED_RELEASE_TABLET | Freq: Every day | ORAL | 3 refills | Status: DC

## 2019-10-21 NOTE — Patient Instructions (Signed)
Continue Protonix once a day.  I have sent in Linzess 145 micrograms to take each morning, 30 minutes before breakfast.  We will recheck liver numbers in Jan 2021.  We will see you in 6 months!  I enjoyed seeing you again today! As you know, I value our relationship and want to provide genuine, compassionate, and quality care. I welcome your feedback. If you receive a survey regarding your visit,  I greatly appreciate you taking time to fill this out. See you next time!  Annitta Needs, PhD, ANP-BC [facility]

## 2019-10-22 NOTE — Progress Notes (Signed)
CC'ED TO PCP 

## 2019-10-30 ENCOUNTER — Ambulatory Visit (HOSPITAL_COMMUNITY): Payer: Medicare Other | Admitting: Nurse Practitioner

## 2019-11-02 ENCOUNTER — Ambulatory Visit (HOSPITAL_COMMUNITY): Payer: Medicare Other | Admitting: Nurse Practitioner

## 2019-11-03 ENCOUNTER — Other Ambulatory Visit: Payer: Self-pay

## 2019-11-03 ENCOUNTER — Other Ambulatory Visit: Payer: Self-pay | Admitting: Family Medicine

## 2019-11-03 DIAGNOSIS — R748 Abnormal levels of other serum enzymes: Secondary | ICD-10-CM

## 2019-11-06 ENCOUNTER — Ambulatory Visit (HOSPITAL_COMMUNITY): Payer: Medicare Other | Admitting: Nurse Practitioner

## 2019-11-12 ENCOUNTER — Other Ambulatory Visit: Payer: Self-pay

## 2019-11-13 ENCOUNTER — Ambulatory Visit (HOSPITAL_COMMUNITY)
Admission: RE | Admit: 2019-11-13 | Discharge: 2019-11-13 | Disposition: A | Discharge status: Home / Self Care | Payer: Medicare Other | Source: Ambulatory Visit | Attending: Nurse Practitioner | Admitting: Nurse Practitioner

## 2019-11-13 ENCOUNTER — Inpatient Hospital Stay (HOSPITAL_COMMUNITY): Payer: Medicare Other

## 2019-11-13 ENCOUNTER — Inpatient Hospital Stay (HOSPITAL_COMMUNITY): Payer: Medicare Other | Attending: Hematology | Admitting: Nurse Practitioner

## 2019-11-13 ENCOUNTER — Telehealth: Payer: Self-pay

## 2019-11-13 ENCOUNTER — Other Ambulatory Visit: Payer: Self-pay | Admitting: Family Medicine

## 2019-11-13 DIAGNOSIS — N189 Chronic kidney disease, unspecified: Secondary | ICD-10-CM | POA: Insufficient documentation

## 2019-11-13 DIAGNOSIS — J449 Chronic obstructive pulmonary disease, unspecified: Secondary | ICD-10-CM | POA: Diagnosis not present

## 2019-11-13 DIAGNOSIS — Z794 Long term (current) use of insulin: Secondary | ICD-10-CM | POA: Diagnosis not present

## 2019-11-13 DIAGNOSIS — M25551 Pain in right hip: Secondary | ICD-10-CM | POA: Diagnosis not present

## 2019-11-13 DIAGNOSIS — Z79899 Other long term (current) drug therapy: Secondary | ICD-10-CM | POA: Diagnosis not present

## 2019-11-13 DIAGNOSIS — F329 Major depressive disorder, single episode, unspecified: Secondary | ICD-10-CM | POA: Diagnosis not present

## 2019-11-13 DIAGNOSIS — E119 Type 2 diabetes mellitus without complications: Secondary | ICD-10-CM | POA: Insufficient documentation

## 2019-11-13 DIAGNOSIS — M25552 Pain in left hip: Secondary | ICD-10-CM | POA: Diagnosis not present

## 2019-11-13 DIAGNOSIS — R5383 Other fatigue: Secondary | ICD-10-CM | POA: Diagnosis not present

## 2019-11-13 DIAGNOSIS — R600 Localized edema: Secondary | ICD-10-CM | POA: Insufficient documentation

## 2019-11-13 DIAGNOSIS — D472 Monoclonal gammopathy: Secondary | ICD-10-CM | POA: Diagnosis not present

## 2019-11-13 DIAGNOSIS — F419 Anxiety disorder, unspecified: Secondary | ICD-10-CM | POA: Insufficient documentation

## 2019-11-13 DIAGNOSIS — E785 Hyperlipidemia, unspecified: Secondary | ICD-10-CM | POA: Diagnosis not present

## 2019-11-13 DIAGNOSIS — I129 Hypertensive chronic kidney disease with stage 1 through stage 4 chronic kidney disease, or unspecified chronic kidney disease: Secondary | ICD-10-CM | POA: Insufficient documentation

## 2019-11-13 DIAGNOSIS — Z87891 Personal history of nicotine dependence: Secondary | ICD-10-CM | POA: Insufficient documentation

## 2019-11-13 DIAGNOSIS — D508 Other iron deficiency anemias: Secondary | ICD-10-CM | POA: Insufficient documentation

## 2019-11-13 DIAGNOSIS — R0602 Shortness of breath: Secondary | ICD-10-CM | POA: Diagnosis not present

## 2019-11-13 LAB — CBC WITH DIFFERENTIAL/PLATELET
Abs Immature Granulocytes: 0.03 10*3/uL (ref 0.00–0.07)
Basophils Absolute: 0 10*3/uL (ref 0.0–0.1)
Basophils Relative: 0 %
Eosinophils Absolute: 0.4 10*3/uL (ref 0.0–0.5)
Eosinophils Relative: 5 %
HCT: 35 % — ABNORMAL LOW (ref 36.0–46.0)
Hemoglobin: 10.3 g/dL — ABNORMAL LOW (ref 12.0–15.0)
Immature Granulocytes: 0 %
Lymphocytes Relative: 15 %
Lymphs Abs: 1.3 10*3/uL (ref 0.7–4.0)
MCH: 28.1 pg (ref 26.0–34.0)
MCHC: 29.4 g/dL — ABNORMAL LOW (ref 30.0–36.0)
MCV: 95.4 fL (ref 80.0–100.0)
Monocytes Absolute: 0.8 10*3/uL (ref 0.1–1.0)
Monocytes Relative: 9 %
Neutro Abs: 5.7 10*3/uL (ref 1.7–7.7)
Neutrophils Relative %: 71 %
Platelets: 320 10*3/uL (ref 150–400)
RBC: 3.67 MIL/uL — ABNORMAL LOW (ref 3.87–5.11)
RDW: 15.9 % — ABNORMAL HIGH (ref 11.5–15.5)
WBC: 8.2 10*3/uL (ref 4.0–10.5)
nRBC: 0 % (ref 0.0–0.2)

## 2019-11-13 LAB — COMPREHENSIVE METABOLIC PANEL
ALT: 14 U/L (ref 0–44)
AST: 16 U/L (ref 15–41)
Albumin: 3.6 g/dL (ref 3.5–5.0)
Alkaline Phosphatase: 143 U/L — ABNORMAL HIGH (ref 38–126)
Anion gap: 8 (ref 5–15)
BUN: 17 mg/dL (ref 8–23)
CO2: 26 mmol/L (ref 22–32)
Calcium: 9.1 mg/dL (ref 8.9–10.3)
Chloride: 106 mmol/L (ref 98–111)
Creatinine, Ser: 0.88 mg/dL (ref 0.44–1.00)
GFR calc Af Amer: >60 mL/min (ref >60)
GFR calc non Af Amer: >60 mL/min (ref >60)
Glucose, Bld: 107 mg/dL — ABNORMAL HIGH (ref 70–99)
Potassium: 4.1 mmol/L (ref 3.5–5.1)
Sodium: 140 mmol/L (ref 135–145)
Total Bilirubin: 0.5 mg/dL (ref 0.3–1.2)
Total Protein: 7.6 g/dL (ref 6.5–8.1)

## 2019-11-13 LAB — LACTATE DEHYDROGENASE: LDH: 203 U/L — ABNORMAL HIGH (ref 98–192)

## 2019-11-13 NOTE — Patient Instructions (Signed)
[place] at [facility] Discharge Instructions  Follow up in 3 months with labs and bone scan   Thank you for choosing [place] at [facility] to provide your oncology and hematology care.  To afford each patient quality time with our provider, please arrive at least 15 minutes before your scheduled appointment time.   If you have a lab appointment with the [facility] please come in thru the Main Entrance and check in at the main information desk.  You need to re-schedule your appointment should you arrive 10 or more minutes late.  We strive to give you quality time with our providers, and arriving late affects you and other patients whose appointments are after yours.  Also, if you no show three or more times for appointments you may be dismissed from the clinic at the providers discretion.     Again, thank you for choosing [facility].  Our hope is that these requests will decrease the amount of time that you wait before being seen by our physicians.       _____________________________________________________________  Should you have questions after your visit to [facility], please contact our office at [phone] between the hours of 8:00 a.m. and 4:30 p.m.  Voicemails left after 4:00 p.m. will not be returned until the following business day.  For prescription refill requests, have your pharmacy contact our office and allow 72 hours.    Due to Covid, you will need to wear a mask upon entering the hospital. If you do not have a mask, a mask will be given to you at the Main Entrance upon arrival. For doctor visits, patients may have 1 support person with them. For treatment visits, patients can not have anyone with them due to social distancing guidelines and our immunocompromised population.

## 2019-11-13 NOTE — Telephone Encounter (Signed)
Pt called with c/o watery diarrhea with a little blood in the toilet. When asked how many times pt is having the watery diarrhea, pt stated every time pt goes to the bathroom to urinate, she has the diarrhea. Pt couldn't given an exact number, it's several times a day. The blood seen is less than a teaspoon. Pt isn't having lightheadedness, no dizziness, no nausea, no vomiting. Pt was wonder if the blood could be from internal hemorrhoids? Pt isn't having to strain to have a bowel movement. Pt doesn't have any external hemorrhoids bothering her. Pt hasn't taken any recent antibiotics. Pt was prescribed Linzess 145 and hasn't taken it in 4-5 days. Pt states she's had loose stool prior to taking the Linzess. Please advise in the abcence of AB. Pt was seen by LSL 1 year ago prior to AB following up with pt.

## 2019-11-13 NOTE — Assessment & Plan Note (Addendum)
1.  Normocytic anemia: - Combination anemia from iron deficiency, CKD, and mild blood loss. -  Last Feraheme infusion on 07/01/2019 - Last colonoscopy on 02/26/2019 showed normal terminal ileum, 2 tubular adenomas in the proximal ascending colon removed, diverticulosis in the ascending, descending, sigmoid and rectosigmoid colon, external and internal hemorrhoids. -Labs done on 09/24/2019 showed her hemoglobin 11, ferritin 212, percent saturation 18.  Her labs were older due to her canceling a couple appointments.  We sent new labs today they are pending -Patient reports she is having some mild bright red bleeding per rectum.  She contacted her GI doctor which told her it was most likely her hemorrhoids and they will get her in for follow-up. - We will see her back in 3 months with repeat blood work.  2.  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  However immunofixation in March and November 2018 was positive for IgA kappa. -Free light chain ratio is normal, normal calcium.   -No other intervention necessary at this time. -Labs done on 09/24/2019 showed no M spike.  Patient had to cancel 2 appointments so her labs were old.  New labs were sent on 11/13/2019 they are pending - He is having pain in her hips and back.  We have ordered a bone scan prior to her next visit -We will see her back in 3 months.

## 2019-11-13 NOTE — Telephone Encounter (Signed)
Noted. Left a detailed message for pt, pt is aware of LSL's recommendations and was asked to call Monday with a PR. I explained to pt that she needed to go to the ED if her symptoms worsen. Pt had an appointment at the cancer center and asked me to leave a VM. VM left for pt.

## 2019-11-13 NOTE — Progress Notes (Signed)
[place], [place] [address]   CLINIC:  Medical Oncology/Hematology  PCP:  Fayrene Helper, MD [address], [address] [place] [address] [phone]   REASON FOR VISIT: Follow-up for normocytic anemia  CURRENT THERAPY: Intermittent iron infusions   INTERVAL HISTORY:  Ms. Chiles 78 y.o. female returns for routine follow-up for normocytic anemia.  She reports she has been doing well since her last visit.  She does have mild shortness of breath with exertion.  She does report having bright red bleeding per rectum however her GI doctor is aware and is following up with this. Denies any nausea, vomiting, or diarrhea. Denies any new pains. Had not noticed any recent bleeding such as epistaxis, hematuria or hematochezia. Denies recent chest pain on exertion, shortness of breath on minimal exertion, pre-syncopal episodes, or palpitations. Denies any numbness or tingling in hands or feet. Denies any recent fevers, infections, or recent hospitalizations. Patient reports appetite at 25% and energy level at 25%.  She is eating well maintaining her weight at this time.    REVIEW OF SYSTEMS:  Review of Systems  Constitutional: Positive for fatigue.  Respiratory: Positive for shortness of breath (With exertion only).   Cardiovascular: Positive for leg swelling.  Neurological: Positive for numbness.  All other systems reviewed and are negative.    PAST MEDICAL/SURGICAL HISTORY:  Past Medical History:  Diagnosis Date  . Anemia in chronic renal disease 12/30/2015  . Anxiety   . Arthritis    "all over" (01/19/2014)  . Arthritis, lumbar spine   . Carpal tunnel syndrome   . Chronic bronchitis ([facility])    "got it q yr for awhile; hasn't had it in awhile" (01/19/2014)  . Chronic kidney disease (CKD) stage G3b/A1, moderately decreased glomerular filtration rate (GFR) between 30-44 mL/min/1.73 square meter and albuminuria creatinine ratio less than 30  mg/g 09/14/2009   Qualifier: Diagnosis of  By: Moshe Cipro MD, Joycelyn Schmid    . Chronic renal disease, stage 3, moderately decreased glomerular filtration rate (GFR) between 30-59 mL/min/1.73 square meter 09/14/2009   Qualifier: Diagnosis of  By: Moshe Cipro MD, Joycelyn Schmid    . Complication of anesthesia    combative  . COPD (chronic obstructive pulmonary disease) ([facility])   . Depression   . Diverticulosis 07/2004   Colonscopy Dr Gala Romney  . Esophagitis, erosive 2009  . Exertional shortness of breath   . Gastroesophageal reflux   . HYWVPXTG(626.9)    "usually a couple times/wk" (01/19/2014)  . Heart murmur    saw cardiology In [place], he told her she did not need to come back.  . Hyperlipidemia   . Hypertension   . Impingement syndrome, shoulder   . Iron deficiency anemia   . Mitral regurgitation   . Myasthenia gravis   . Myasthenia gravis in remission ([facility]) 11/24/2014  . Myasthenia gravis in remission ([facility])   . Obesity   . OSA on CPAP    Negative on last sleep study  . Pneumonia 01/2012  . Pulmonary HTN ([facility])   . Schatzki's ring    Last EGD w/ dilation 02/08/11, 2009 & 2007  . Seasonal allergies   . Shoulder pain   . Thyroid disease    "used to take RX; they took me off it" (01/19/2014)  . Tobacco abuse   . Type II diabetes mellitus ([facility])    Past Surgical History:  Procedure Laterality Date  . [place] VITRECTOMY WITH 20 GAUGE MVR PORT Left  01/19/2014   Procedure: 25 GAUGE PARS PLANA VITRECTOMY WITH 20 GAUGE MVR PORT; MEMBRAME PEEL; SERUM PATCH; LASER TREATMENT; C3F8;  Surgeon: Hayden Pedro, MD;  Location: [facility];  Service: Ophthalmology;  Laterality: Left;  . ABDOMINAL HYSTERECTOMY    . AGILE CAPSULE N/A 08/05/2019   Procedure: AGILE CAPSULE;  Surgeon: Daneil Dolin, MD;  [facility];  Service: Endoscopy;  Laterality: N/A;  7:30am  . APPENDECTOMY    . CARPAL TUNNEL RELEASE Bilateral   . CATARACT EXTRACTION W/PHACO Left 10/21/2013   Procedure: LEFT CATARACT  EXTRACTION PHACO AND INTRAOCULAR LENS PLACEMENT (IOC);  Surgeon: Marylynn Pearson, MD;  Location: [facility];  Service: Ophthalmology;  Laterality: Left;  . CHOLECYSTECTOMY    . COLONOSCOPY  05/08/2012   MNO:TRRNHAFB and external hemorrhoids; colonic diverticulosis  . COLONOSCOPY WITH PROPOFOL N/A 02/26/2019   two simple adenomas, diverticulosis,  and internal hemorrhoids.   . ESOPHAGEAL DILATION     "more than 3 times" (01/19/2014)  . ESOPHAGOGASTRODUODENOSCOPY  02/08/11   [place]-Distal esophageal erosion consistent with mild erosive reflux   esophagitis/ Noncritical Schatzki ring, small hiatal hernia otherwise upper/ gastrointestinal tract appeared unremarkable, status post passage  of a Maloney dilation to biopsy disruption of the ring described  . ESOPHAGOGASTRODUODENOSCOPY  04/2012   2 tandem incomplete distal esophagea rings s/p dilation.   . ESOPHAGOGASTRODUODENOSCOPY N/A 09/16/2014   Dr. Gala Romney: Schatzki ring status post dilation/disruption.  Hiatal hernia.  Marland Kitchen ESOPHAGOGASTRODUODENOSCOPY (EGD) WITH PROPOFOL N/A 12/08/2018   EGD with mild Schatzki ring s/p dilation, small hiatal hernia, otherwise normal  . EYE SURGERY     cataracts, bilateral  . GIVENS CAPSULE STUDY N/A 09/29/2019   Procedure: GIVENS CAPSULE STUDY;  Surgeon: Daneil Dolin, MD;  [facility];  Service: Endoscopy;  Laterality: N/A;  7:30am  . INCONTINENCE SURGERY  08/26/09   [place]  . JOINT REPLACEMENT     right total knee  . MALONEY DILATION N/A 09/16/2014   Procedure: Venia Minks DILATION;  Surgeon: Daneil Dolin, MD;  [facility];  Service: Endoscopy;  Laterality: N/A;  . Venia Minks DILATION N/A 12/08/2018   Procedure: Venia Minks DILATION;  Surgeon: Daneil Dolin, MD;  [facility];  Service: Endoscopy;  Laterality: N/A;  . PARS PLANA VITRECTOMY W/ REPAIR OF MACULAR HOLE Left 01/19/2014  . POLYPECTOMY  02/26/2019   Procedure: POLYPECTOMY;  Surgeon: Danie Binder, MD;  [facility];  Service:  Endoscopy;;  ascending colon polyps x2  . SAVORY DILATION N/A 09/16/2014   Procedure: SAVORY DILATION;  Surgeon: Daneil Dolin, MD;  [facility];  Service: Endoscopy;  Laterality: N/A;  . TONSILLECTOMY    . TOTAL KNEE ARTHROPLASTY Right 05/13/07   Dr. Aline Brochure  . TRIGGER FINGER RELEASE Right 06/26/2018   Procedure: RIGHT INDEX FINGER TRIGGER FINGER/A-1 PULLEY RELEASE;  Surgeon: Carole Civil, MD;  Location: [facility];  Service: Orthopedics;  Laterality: Right;     SOCIAL HISTORY:  Social History   Socioeconomic History  . Marital status: Single    Spouse name: Not on file  . Number of children: 2  . Years of education: Not on file  . Highest education level: Not on file  Occupational History  . Occupation: disabled    Employer: RETIRED  Social Needs  . Financial resource strain: Not very hard  . Food insecurity    Worry: Never true    Inability: Never true  . Transportation needs    Medical:  Yes    Non-medical: No  Tobacco Use  . Smoking status: Former Smoker    Packs/day: 0.50    Years: 25.00    Pack years: 12.50    Types: Cigarettes    Quit date: 01/01/2004    Years since quitting: 15.8  . Smokeless tobacco: Never Used  Substance and Sexual Activity  . Alcohol use: No  . Drug use: No  . Sexual activity: Never    Birth control/protection: Surgical  Lifestyle  . Physical activity    Days per week: 0 days    Minutes per session: 0 min  . Stress: Only a little  Relationships  . Social Herbalist on phone: More than three times a week    Gets together: Never    Attends religious service: Never    Active member of club or organization: No    Attends meetings of clubs or organizations: Never    Relationship status: Never married  . Intimate partner violence    Fear of current or ex partner: No    Emotionally abused: No    Physically abused: No    Forced sexual activity: No  Other Topics Concern  . Not on file  Social History Narrative    Patient lives at home by herself   Patient drinks one cup of coffee a day   Patient is right handed.    FAMILY HISTORY:  Family History  Problem Relation Age of Onset  . Cancer Mother   . Heart disease Mother   . Cancer Father   . Heart disease Father   . Diabetes Sister   . Hypertension Brother   . Heart disease Brother   . Cancer Sister   . Kidney failure Sister   . Diabetes Son   . Hypertension Son   . Hypertension Daughter   . Colon cancer Neg Hx     CURRENT MEDICATIONS:  Outpatient Encounter Medications as of 11/13/2019  Medication Sig  . ACETAMINOPHEN EXTRA STRENGTH 500 MG tablet TAKE (1) TABLET BY MOUTH AT BEDTIME. (Patient taking differently: as needed. )  . amLODipine (NORVASC) 10 MG tablet TAKE 1 TABLET BY MOUTH ONCE A DAY.  Marland Kitchen B-D ULTRAFINE III SHORT PEN 31G X 8 MM MISC USE ONCE DAILY WITH LANTUS SOLOSTAR PEN.  . brimonidine-timolol (COMBIGAN) 0.2-0.5 % ophthalmic solution Place 1 drop into both eyes every 12 (twelve) hours.  . cetirizine (ZYRTEC) 10 MG tablet TAKE 1 TABLET BY MOUTH ONCE A DAY. (Patient taking differently: Take 10 mg by mouth as needed. )  . Cholecalciferol (VITAMIN D3) 2000 units TABS Take 2,000 Units by mouth at bedtime.  . citalopram (CELEXA) 20 MG tablet TAKE 1 TABLET BY MOUTH ONCE A DAY.  Marland Kitchen Cod Liver Oil CAPS Take by mouth. 500 MG per patient  . Cranberry 1000 MG CAPS Take 1,000 mg by mouth daily.  . cycloSPORINE (RESTASIS) 0.05 % ophthalmic emulsion Place 1 drop into both eyes 2 (two) times daily.   Marland Kitchen diltiazem (CARDIZEM CD) 240 MG 24 hr capsule TAKE (1) CAPSULE BY MOUTH TWICE DAILY.  Marland Kitchen ezetimibe (ZETIA) 10 MG tablet TAKE 1 TABLET BY MOUTH ONCE A DAY.  Marland Kitchen gabapentin (NEURONTIN) 300 MG capsule Take 1 capsule (300 mg total) by mouth 2 (two) times daily.  Marland Kitchen glucose blood test strip   . hydrALAZINE (APRESOLINE) 50 MG tablet TAKE 1 TABLET BY MOUTH THREE TIMES A DAY.  Marland Kitchen HYDROcodone-acetaminophen (NORCO) 10-325 MG tablet Take 1 tablet by mouth 2  (  two) times daily.  Marland Kitchen LANTUS SOLOSTAR 100 UNIT/ML Solostar Pen INJECT 30 UNITS SUBCUTANEOUSLY ONCE DAILY. (Patient taking differently: Inject 30 Units into the skin daily. )  . linaclotide (LINZESS) 145 MCG CAPS capsule Take 1 capsule (145 mcg total) by mouth daily before breakfast.  . losartan (COZAAR) 100 MG tablet TAKE (1) TABLET BY MOUTH DAILY.  . Multiple Vitamin (MULTIVITAMIN WITH MINERALS) TABS tablet Take 1 tablet by mouth at bedtime.  Glory Rosebush DELICA LANCETS 41P MISC USE AS DIRECTED 3 TIMES DAILY FOR BLOOD GLUCOSE TESTING.  . pantoprazole (PROTONIX) 40 MG tablet Take 1 tablet (40 mg total) by mouth daily. 30 minutes before breakfast.  . predniSONE (DELTASONE) 5 MG tablet Take 1 tablet (5 mg total) by mouth daily.  Marland Kitchen pyridostigmine (MESTINON) 60 MG tablet TAKE (1) TABLET BY MOUTH THREE TIMES A DAY.  . rosuvastatin (CRESTOR) 40 MG tablet TAKE (1) TABLET BY MOUTH AT BEDTIME.  Marland Kitchen solifenacin (VESICARE) 10 MG tablet TAKE 1 TABLET BY MOUTH ONCE A DAY.  Marland Kitchen spironolactone (ALDACTONE) 25 MG tablet TAKE 1 TABLET BY MOUTH ONCE A DAY.  . TRADJENTA 5 MG TABS tablet TAKE 1 TABLET BY MOUTH ONCE A DAY.  Marland Kitchen UNABLE TO FIND Diabetic shoes x 1 Inserts x 3 DX E11.9  . venlafaxine XR (EFFEXOR-XR) 150 MG 24 hr capsule TAKE 1 CAPSULE BY MOUTH DAILY.  . fluticasone (FLONASE) 50 MCG/ACT nasal spray PLACE 2 SPRAYS INTO BOTH NOSTRILS DAILY. (Patient not taking: Reported on 11/13/2019)  . ondansetron (ZOFRAN) 4 MG tablet TAKE (1) TABLET BY MOUTH EVERY FOUR HOURS AS NEEDED FOR NAUSEA. (Patient not taking: Reported on 11/13/2019)  . ondansetron (ZOFRAN-ODT) 8 MG disintegrating tablet Take 1 tablet (8 mg total) by mouth every 8 (eight) hours as needed for nausea or vomiting. (Patient not taking: Reported on 11/13/2019)  . PROAIR HFA 108 (90 Base) MCG/ACT inhaler INHALE 2 PUFFS BY MOUTH EVERY 6 HOURS AS NEEDED . (Patient not taking: Reported on 11/13/2019)   No facility-administered encounter medications on file as of  11/13/2019.     ALLERGIES:  No Known Allergies   PHYSICAL EXAM:  ECOG Performance status: 1  Vitals:   11/13/19 1109  BP: (!) 148/58  Pulse: (!) 59  Resp: 16  Temp: (!) 97.5 F (36.4 C)  SpO2: 100%   Filed Weights   11/13/19 1109  Weight: 268 lb (121.6 kg)    Physical Exam Constitutional:      Appearance: Normal appearance. She is normal weight.  Cardiovascular:     Rate and Rhythm: Normal rate and regular rhythm.     Heart sounds: Normal heart sounds.  Pulmonary:     Effort: Pulmonary effort is normal.     Breath sounds: Normal breath sounds.  Abdominal:     General: Bowel sounds are normal.     Palpations: Abdomen is soft.  Musculoskeletal: Normal range of motion.  Skin:    General: Skin is warm.  Neurological:     Mental Status: She is alert and oriented to person, place, and time. Mental status is at baseline.  Psychiatric:        Mood and Affect: Mood normal.        Behavior: Behavior normal.        Thought Content: Thought content normal.        Judgment: Judgment normal.      LABORATORY DATA:  I have reviewed the labs as listed.  CBC    Component Value Date/Time   WBC 8.4  09/24/2019 1328   RBC 3.80 (L) 09/24/2019 1328   HGB 11.0 (L) 09/24/2019 1328   HCT 36.7 09/24/2019 1328   PLT 305 09/24/2019 1328   MCV 96.6 09/24/2019 1328   MCH 28.9 09/24/2019 1328   MCHC 30.0 09/24/2019 1328   RDW 16.9 (H) 09/24/2019 1328   LYMPHSABS 1.4 09/24/2019 1328   MONOABS 0.8 09/24/2019 1328   EOSABS 0.3 09/24/2019 1328   BASOSABS 0.0 09/24/2019 1328   CMP Latest Ref Rng & Units 09/24/2019 06/10/2019 03/03/2019  Glucose 70 - 99 mg/dL 230(H) 159(H) 102(H)  BUN 8 - 23 mg/dL _0 Creatinine 0.44 - 1.00 mg/dL 1.14(H) 1.04(H) 0.94  Sodium 135 - 145 mmol/L 142 137 141  Potassium 3.5 - 5.1 mmol/L 3.7 3.7 4.0  Chloride 98 - 111 mmol/L 106 108 106  CO2 22 - 32 mmol/L 27 20(L) 28  Calcium 8.9 - 10.3 mg/dL 9.1 8.6(L) 9.2  Total Protein 6.5 - 8.1 g/dL 7.4 7.0 7.3   Total Bilirubin 0.3 - 1.2 mg/dL 0.6 0.2(L) 0.5  Alkaline Phos 38 - 126 U/L 141(H) 133(H) 135(H)  AST 15 - 41 U/L _1 ALT 0 - 44 U/L _2 DIAGNOSTIC IMAGING:  I have independently reviewed the skeletal survey scan and discussed with the patient.  I personally performed a face-to-face visit,   All questions were answered to patient's stated satisfaction. Encouraged patient to call with any new concerns or questions before his next visit to the cancer center and we can certain see him sooner, if needed.     ASSESSMENT & PLAN:   Monoclonal gammopathy of unknown significance (MGUS) 1.  Normocytic anemia: - Combination anemia from iron deficiency, CKD, and mild blood loss. -  Last Feraheme infusion on 07/01/2019 - Last colonoscopy on 02/26/2019 showed normal terminal ileum, 2 tubular adenomas in the proximal ascending colon removed, diverticulosis in the ascending, descending, sigmoid and rectosigmoid colon, external and internal hemorrhoids. -Labs done on 09/24/2019 showed her hemoglobin 11, ferritin 212, percent saturation 18.  Her labs were older due to her canceling a couple appointments.  We sent new labs today they are pending -Patient reports she is having some mild bright red bleeding per rectum.  She contacted her GI doctor which told her it was most likely her hemorrhoids and they will get her in for follow-up. - We will see her back in 3 months with repeat blood work.  2.  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  However immunofixation in March and November 2018 was positive for IgA kappa. -Free light chain ratio is normal, normal calcium.   -No other intervention necessary at this time. -Labs done on 09/24/2019 showed no M spike.  Patient had to cancel 2 appointments so her labs were old.  New labs were sent on 11/13/2019 they are pending - He is having pain in her hips and back.  We have ordered a bone scan prior to her next visit -We will see  her back in 3 months.      Orders placed this encounter:  Orders Placed This Encounter  Procedures  . DG Bone Survey Met  . Lactate dehydrogenase  . Protein electrophoresis, serum  . Kappa/lambda light chains  . CBC with Differential/Platelet  . Comprehensive metabolic panel  . Ferritin  . Iron and TIBC  . Vitamin D 25 hydroxy  . Vitamin B12  . Folate  . Lactate  dehydrogenase  . Protein electrophoresis, serum  . Kappa/lambda light chains  . CBC with Differential/Platelet  . Comprehensive metabolic panel      Francene Finders, FNP-C [place] [phone]

## 2019-11-13 NOTE — Telephone Encounter (Signed)
Have her hold Linzess over the weekend to see if diarrhea stops. Bleeding likely due to hemorrhoids.   Ok to try imodium 2mg  up to twice daily over the weekend to see if diarrhea will stop.  Call Monday with progress report and further instructions from AB.  If symptoms worsen, go to ER.

## 2019-11-16 ENCOUNTER — Telehealth: Payer: Self-pay | Admitting: *Deleted

## 2019-11-16 LAB — KAPPA/LAMBDA LIGHT CHAINS
Kappa free light chain: 38.4 mg/L — ABNORMAL HIGH (ref 3.3–19.4)
Kappa, lambda light chain ratio: 1.38 (ref 0.26–1.65)
Lambda free light chains: 27.8 mg/L — ABNORMAL HIGH (ref 5.7–26.3)

## 2019-11-16 LAB — PROTEIN ELECTROPHORESIS, SERUM
A/G Ratio: 0.9 (ref 0.7–1.7)
Albumin ELP: 3.4 g/dL (ref 2.9–4.4)
Alpha-1-Globulin: 0.3 g/dL (ref 0.0–0.4)
Alpha-2-Globulin: 0.8 g/dL (ref 0.4–1.0)
Beta Globulin: 1.2 g/dL (ref 0.7–1.3)
Gamma Globulin: 1.5 g/dL (ref 0.4–1.8)
Globulin, Total: 3.8 g/dL (ref 2.2–3.9)
Total Protein ELP: 7.2 g/dL (ref 6.0–8.5)

## 2019-11-16 NOTE — Telephone Encounter (Signed)
Pt called said she already had a diabetic foot exam but she needs a prescription for her diabetic shoes sent to [facility]

## 2019-11-16 NOTE — Telephone Encounter (Signed)
Spoke with pt. Pt says the diarrhea is so much better. Pt notices just a little blood on the tissue when she wipes her rectum. Pt has no pain at her rectum and started using Preparation H.

## 2019-11-16 NOTE — Telephone Encounter (Signed)
Can we check on patient to see if diarrhea is resolved?

## 2019-11-16 NOTE — Telephone Encounter (Signed)
Chart shows shes overdue for her foot exam. If she had foot exam somewhere else, who did it. If none then will have to wait until her next appt so the foot exam can be faxed with the order as required.

## 2019-11-16 NOTE — Telephone Encounter (Signed)
Patient said she will do this when she comes in 12/31 for her physical

## 2019-11-17 ENCOUNTER — Other Ambulatory Visit: Payer: Self-pay | Admitting: Family Medicine

## 2019-11-18 NOTE — Telephone Encounter (Signed)
Glad she is doing better! At this point, recommend supplemental fiber only such as Benefiber or Metamucil to help with good bowel habits. Continue to hold Linzess.

## 2019-11-18 NOTE — Telephone Encounter (Signed)
Noted. Pt notified of AB's recommendations and will hold the Linzess.

## 2019-11-20 ENCOUNTER — Other Ambulatory Visit: Payer: Self-pay | Admitting: Family Medicine

## 2019-12-14 ENCOUNTER — Other Ambulatory Visit: Payer: Self-pay

## 2019-12-14 DIAGNOSIS — R748 Abnormal levels of other serum enzymes: Secondary | ICD-10-CM

## 2019-12-15 ENCOUNTER — Telehealth: Payer: Self-pay

## 2019-12-15 NOTE — Telephone Encounter (Signed)
Pt called office, Linzess 46mcg samples helped. She wants rx sent to [facility].

## 2019-12-16 MED ORDER — LINACLOTIDE 72 MCG PO CAPS: 72.0000 ug | ORAL_CAPSULE | Freq: Every day | ORAL | 3 refills | Status: DC

## 2019-12-16 NOTE — Telephone Encounter (Signed)
Completed.

## 2019-12-22 ENCOUNTER — Other Ambulatory Visit: Payer: Self-pay | Admitting: Family Medicine

## 2019-12-22 ENCOUNTER — Encounter: Payer: Medicare Other | Admitting: Family Medicine

## 2019-12-31 ENCOUNTER — Other Ambulatory Visit: Payer: Self-pay

## 2019-12-31 ENCOUNTER — Encounter: Payer: Self-pay | Admitting: Family Medicine

## 2019-12-31 ENCOUNTER — Ambulatory Visit (INDEPENDENT_AMBULATORY_CARE_PROVIDER_SITE_OTHER): Payer: Medicare Other | Admitting: Family Medicine

## 2019-12-31 ENCOUNTER — Encounter (INDEPENDENT_AMBULATORY_CARE_PROVIDER_SITE_OTHER): Payer: Self-pay

## 2019-12-31 VITALS — BP 164/74 | HR 72 | Temp 97.5°F | Resp 15 | Ht 66.0 in | Wt 263.0 lb

## 2019-12-31 DIAGNOSIS — R52 Pain, unspecified: Secondary | ICD-10-CM

## 2019-12-31 DIAGNOSIS — Z0000 Encounter for general adult medical examination without abnormal findings: Secondary | ICD-10-CM

## 2019-12-31 DIAGNOSIS — E1122 Type 2 diabetes mellitus with diabetic chronic kidney disease: Secondary | ICD-10-CM

## 2019-12-31 DIAGNOSIS — I10 Essential (primary) hypertension: Secondary | ICD-10-CM

## 2019-12-31 DIAGNOSIS — E785 Hyperlipidemia, unspecified: Secondary | ICD-10-CM

## 2019-12-31 DIAGNOSIS — R7301 Impaired fasting glucose: Secondary | ICD-10-CM

## 2019-12-31 NOTE — Progress Notes (Signed)
    [name]     MRN: [id number]      DOB: [birth year]  HPI: Patient is in for annual physical exam. Uncontrolled hypertension ad pain management are also addressed Immunization is reviewed , and  Is up to date   PE: BP (!) 164/74   Pulse 72   Temp (!) 97.5 F (36.4 C) (Temporal)   Resp 15   Ht 5\' 6"  (1.676 m)   Wt 263 lb (119.3 kg)   SpO2 93%   BMI 42.45 kg/m   Pleasant  female, alert and oriented x 3, in no cardio-pulmonary distress. Afebrile. HEENT No facial trauma or asymetry. Sinuses non tender.  Extra occullar muscles intact.. External ears normal, . Neck: decreased though adequate ROM,  no adenopathy,JVD or thyromegaly.No bruits.  Chest: Clear to ascultation bilaterally.No crackles or wheezes. Non tender to palpation  Breast: Not examined, normal mammogram in 07/2019  Cardiovascular system; Heart sounds normal,  S1 and  S2 ,no S3.  No murmur, or thrill.  Peripheral pulses normal.  Abdomen: Soft, non tender.  No guarding, tenderness or rebound.     Musculoskeletal exam: Markedly reduced  ROM of spine, hips , shoulders and knees.  deformity ,swelling or crepitus noted. No muscle wasting or atrophy.   Neurologic: Cranial nerves 2 to 12 intact. Power, tone ,sensation and reflexes normal throughout.  disturbance in gait. No tremor.  Skin: Intact, no ulceration, erythema , scaling or rash noted. Pigmentation normal throughout  Psych; Normal mood and affect. Judgement and concentration normal   Assessment & Plan:  Annual physical exam Annual exam as documented. Counseling done  re healthy lifestyle involving commitment to 150 minutes exercise per week, heart healthy diet, and attaining healthy weight.The importance of adequate sleep also discussed. Regular seat belt use and home safety, is also discussed. Changes in health habits are decided on by the patient with goals and time frames  set for achieving them. Immunization and cancer  screening needs are specifically addressed at this visit.   Malignant hypertension Unocntrolled, not at goal, questionable medication compliance. Re eval I 4 weeks DASH diet and commitment to daily physical activity for a minimum of 30 minutes discussed and encouraged, as a part of hypertension management. The importance of attaining a healthy weight is also discussed.  BP/Weight 12/31/2019 11/13/2019 10/21/2019 09/23/2019 07/28/2019 07/01/2019 [id number]  Systolic BP 123456 123456 [id number] 123456 [id number]  Diastolic BP 74 58 75 78 55 71 68  Wt. (Lbs) 263 268 264 261 263.8 - 260  BMI 42.45 43.26 42.61 42.13 42.58 - 41.97       Encounter for pain management The patient's Controlled Substance registry is reviewed and compliance confirmed. Adequacy of  Pain control and level of function is assessed. Medication dosing is adjusted as deemed appropriate. Twelve weeks of medication is prescribed , patient signs for the script and is provided with a follow up appointment between 11 to 12 weeks .

## 2019-12-31 NOTE — Patient Instructions (Addendum)
F/U with MD for bloord pressure re evaluation in offic in 4 weeks, call if you need me sooner  Microalb from office today , if able    Please get fasting lipid, cmp and EGFr, hBA1C and microalb in lab next week if no specimen today  Blood pressure medication, take at 4am,  Midday and 8 pm, the hydralazine TAKE AMLODIPINE AND SPIRONOLACTONE AT 8AM  CARDIZEM AT 8 AM AND 8 PM LOSARTAN AT 8AM   Thanks for choosing [facility], we consider it a privelige to serve you.

## 2020-01-03 ENCOUNTER — Encounter: Payer: Self-pay | Admitting: Family Medicine

## 2020-01-03 MED ORDER — HYDROCODONE-ACETAMINOPHEN 10-325 MG PO TABS: ORAL_TABLET | ORAL | 0 refills | Status: AC

## 2020-01-03 MED ORDER — HYDROCODONE-ACETAMINOPHEN 10-325 MG PO TABS: ORAL_TABLET | ORAL | 0 refills | Status: DC

## 2020-01-03 NOTE — Assessment & Plan Note (Signed)

## 2020-01-03 NOTE — Assessment & Plan Note (Signed)
The patient's Controlled Substance registry is reviewed and compliance confirmed. Adequacy of  Pain control and level of function is assessed. Medication dosing is adjusted as deemed appropriate. Twelve weeks of medication is prescribed , patient signs for the script and is provided with a follow up appointment between 11 to 12 weeks .  

## 2020-01-03 NOTE — Assessment & Plan Note (Signed)
Unocntrolled, not at goal, questionable medication compliance. Re eval I 4 weeks DASH diet and commitment to daily physical activity for a minimum of 30 minutes discussed and encouraged, as a part of hypertension management. The importance of attaining a healthy weight is also discussed.  BP/Weight 12/31/2019 11/13/2019 10/21/2019 09/23/2019 07/28/2019 07/01/2019 [id number]  Systolic BP 123456 123456 [id number] 123456 [id number]  Diastolic BP 74 58 75 78 55 71 68  Wt. (Lbs) 263 268 264 261 263.8 - 260  BMI 42.45 43.26 42.61 42.13 42.58 - 41.97

## 2020-01-04 ENCOUNTER — Other Ambulatory Visit (HOSPITAL_COMMUNITY)
Admission: RE | Admit: 2020-01-04 | Discharge: 2020-01-04 | Disposition: A | Discharge status: Home / Self Care | Payer: Medicare Other | Source: Ambulatory Visit | Attending: Family Medicine | Admitting: Family Medicine

## 2020-01-04 DIAGNOSIS — E785 Hyperlipidemia, unspecified: Secondary | ICD-10-CM | POA: Diagnosis not present

## 2020-01-04 DIAGNOSIS — I10 Essential (primary) hypertension: Secondary | ICD-10-CM | POA: Diagnosis not present

## 2020-01-04 DIAGNOSIS — E1122 Type 2 diabetes mellitus with diabetic chronic kidney disease: Secondary | ICD-10-CM | POA: Insufficient documentation

## 2020-01-05 LAB — MICROALBUMIN / CREATININE URINE RATIO
Creatinine, Urine: 192.8 mg/dL
Microalb Creat Ratio: 261 mg/g creat — ABNORMAL HIGH (ref 0–29)
Microalb, Ur: 504.1 ug/mL — ABNORMAL HIGH

## 2020-01-18 ENCOUNTER — Telehealth: Payer: Self-pay | Admitting: Internal Medicine

## 2020-01-18 NOTE — Telephone Encounter (Signed)
Spoke with pt. Pt feels that she's been dealing with bleeding since she had her capsule study. Pt's bleeding started back light 2 weeks ago and this morning, pt didn't make it to the bathroom, before she felt the blooding coming down. Pt says she never had a problem with hemorrhoids and doesn't feel that's the problem. Pt has felt a little weak from time to time and has had to catch her balance. When asked had the bleeding stopped, pt says no. Pt is able to wipe her rectum and see the blood. Pt isn't having any nausea, vomiting or abdominal pain.

## 2020-01-18 NOTE — Telephone Encounter (Signed)
Pt is having heavy rectal bleeding.  Please call [phone]

## 2020-01-18 NOTE — Telephone Encounter (Signed)
If patient feels weak, dizzy, light-headed currently with this bleeding, needs to seek ED evaluation. If she is asymptomatic, check stat CBC. Make sure to avoid any NSAIDs, avoid straining, no prolonged toilet time. If large volume needs to go to the ED.

## 2020-01-18 NOTE — Telephone Encounter (Signed)
Spoke with pt. She is going to go to the ED since she is feeling lightheaded at times.

## 2020-01-20 ENCOUNTER — Other Ambulatory Visit: Payer: Self-pay | Admitting: Family Medicine

## 2020-01-21 ENCOUNTER — Other Ambulatory Visit: Payer: Self-pay | Admitting: Family Medicine

## 2020-01-21 ENCOUNTER — Other Ambulatory Visit: Payer: Self-pay | Admitting: Gastroenterology

## 2020-01-27 DIAGNOSIS — H401132 Primary open-angle glaucoma, bilateral, moderate stage: Secondary | ICD-10-CM | POA: Diagnosis not present

## 2020-01-28 ENCOUNTER — Ambulatory Visit: Payer: Medicare Other | Admitting: Family Medicine

## 2020-02-01 ENCOUNTER — Telehealth: Payer: Self-pay

## 2020-02-01 NOTE — Telephone Encounter (Signed)
Pt needs to know her blood type, I advised that if it was not in the chart, it could be lab work to be done

## 2020-02-01 NOTE — Telephone Encounter (Signed)
Aware she is B+

## 2020-02-04 ENCOUNTER — Ambulatory Visit: Payer: Medicare Other | Admitting: Family Medicine

## 2020-02-10 DIAGNOSIS — I10 Essential (primary) hypertension: Secondary | ICD-10-CM | POA: Diagnosis not present

## 2020-02-10 DIAGNOSIS — E785 Hyperlipidemia, unspecified: Secondary | ICD-10-CM | POA: Diagnosis not present

## 2020-02-10 DIAGNOSIS — R7301 Impaired fasting glucose: Secondary | ICD-10-CM | POA: Diagnosis not present

## 2020-02-11 ENCOUNTER — Other Ambulatory Visit: Payer: Self-pay

## 2020-02-11 ENCOUNTER — Encounter: Payer: Self-pay | Admitting: Family Medicine

## 2020-02-11 ENCOUNTER — Ambulatory Visit (INDEPENDENT_AMBULATORY_CARE_PROVIDER_SITE_OTHER): Payer: Medicare Other | Admitting: Family Medicine

## 2020-02-11 VITALS — BP 130/50 | HR 76 | Temp 98.0°F | Resp 15 | Ht 66.0 in | Wt 264.0 lb

## 2020-02-11 DIAGNOSIS — R079 Chest pain, unspecified: Secondary | ICD-10-CM | POA: Diagnosis not present

## 2020-02-11 DIAGNOSIS — Z9189 Other specified personal risk factors, not elsewhere classified: Secondary | ICD-10-CM | POA: Diagnosis not present

## 2020-02-11 DIAGNOSIS — R5383 Other fatigue: Secondary | ICD-10-CM

## 2020-02-11 DIAGNOSIS — I10 Essential (primary) hypertension: Secondary | ICD-10-CM | POA: Diagnosis not present

## 2020-02-11 DIAGNOSIS — E785 Hyperlipidemia, unspecified: Secondary | ICD-10-CM

## 2020-02-11 DIAGNOSIS — F418 Other specified anxiety disorders: Secondary | ICD-10-CM

## 2020-02-11 LAB — COMPLETE METABOLIC PANEL WITH GFR
AG Ratio: 1.4 (calc) (ref 1.0–2.5)
ALT: 10 U/L (ref 6–29)
AST: 11 U/L (ref 10–35)
Albumin: 3.7 g/dL (ref 3.6–5.1)
Alkaline phosphatase (APISO): 133 U/L (ref 37–153)
BUN/Creatinine Ratio: 18 (calc) (ref 6–22)
BUN: 20 mg/dL (ref 7–25)
CO2: 26 mmol/L (ref 20–32)
Calcium: 8.9 mg/dL (ref 8.6–10.4)
Chloride: 106 mmol/L (ref 98–110)
Creat: 1.11 mg/dL — ABNORMAL HIGH (ref 0.60–0.93)
GFR, Est African American: 56 mL/min/{1.73_m2} — ABNORMAL LOW (ref >=60)
GFR, Est Non African American: 48 mL/min/{1.73_m2} — ABNORMAL LOW (ref >=60)
Globulin: 2.6 g/dL (calc) (ref 1.9–3.7)
Glucose, Bld: 169 mg/dL — ABNORMAL HIGH (ref 65–99)
Potassium: 4.5 mmol/L (ref 3.5–5.3)
Sodium: 139 mmol/L (ref 135–146)
Total Bilirubin: 0.3 mg/dL (ref 0.2–1.2)
Total Protein: 6.3 g/dL (ref 6.1–8.1)

## 2020-02-11 LAB — LIPID PANEL
Cholesterol: 173 mg/dL (ref <200)
HDL: 45 mg/dL — ABNORMAL LOW (ref >=50)
LDL Cholesterol (Calc): 109 mg/dL (calc) — ABNORMAL HIGH
Non-HDL Cholesterol (Calc): 128 mg/dL (calc) (ref <130)
Total CHOL/HDL Ratio: 3.8 (calc) (ref <5.0)
Triglycerides: 94 mg/dL (ref <150)

## 2020-02-11 LAB — HEMOGLOBIN A1C
Hgb A1c MFr Bld: 7.3 % of total Hgb — ABNORMAL HIGH (ref <5.7)
Mean Plasma Glucose: 163 (calc)
eAG (mmol/L): 9 (calc)

## 2020-02-11 MED ORDER — CITALOPRAM HYDROBROMIDE 10 MG PO TABS: 10.0000 mg | ORAL_TABLET | Freq: Every day | ORAL | 0 refills | Status: DC

## 2020-02-11 MED ORDER — AZITHROMYCIN 250 MG PO TABS: ORAL_TABLET | ORAL | 0 refills | Status: DC

## 2020-02-11 NOTE — Patient Instructions (Addendum)
F/U in mid May, chronic pain management, call if you need me before  You will be referred to Cardiology for evaluation.  Thanks for choosing [facility], we consider it a privelige to serve you.  Azithromycin  is prescribed for sinus   New lower dose of citalopram 10 mg for 1 month only, when you next get your medication, use the 20 mg dose that you do have until done

## 2020-02-12 ENCOUNTER — Inpatient Hospital Stay (HOSPITAL_COMMUNITY): Payer: Medicare Other | Attending: Hematology

## 2020-02-12 DIAGNOSIS — Z87891 Personal history of nicotine dependence: Secondary | ICD-10-CM | POA: Diagnosis not present

## 2020-02-12 DIAGNOSIS — R5383 Other fatigue: Secondary | ICD-10-CM | POA: Insufficient documentation

## 2020-02-12 DIAGNOSIS — Z809 Family history of malignant neoplasm, unspecified: Secondary | ICD-10-CM | POA: Diagnosis not present

## 2020-02-12 DIAGNOSIS — M7989 Other specified soft tissue disorders: Secondary | ICD-10-CM | POA: Diagnosis not present

## 2020-02-12 DIAGNOSIS — K625 Hemorrhage of anus and rectum: Secondary | ICD-10-CM | POA: Insufficient documentation

## 2020-02-12 DIAGNOSIS — D472 Monoclonal gammopathy: Secondary | ICD-10-CM | POA: Diagnosis not present

## 2020-02-12 DIAGNOSIS — R0602 Shortness of breath: Secondary | ICD-10-CM | POA: Diagnosis not present

## 2020-02-12 DIAGNOSIS — E611 Iron deficiency: Secondary | ICD-10-CM | POA: Insufficient documentation

## 2020-02-12 DIAGNOSIS — R200 Anesthesia of skin: Secondary | ICD-10-CM | POA: Diagnosis not present

## 2020-02-12 DIAGNOSIS — N189 Chronic kidney disease, unspecified: Secondary | ICD-10-CM | POA: Insufficient documentation

## 2020-02-12 LAB — COMPREHENSIVE METABOLIC PANEL
ALT: 15 U/L (ref 0–44)
AST: 16 U/L (ref 15–41)
Albumin: 3.2 g/dL — ABNORMAL LOW (ref 3.5–5.0)
Alkaline Phosphatase: 124 U/L (ref 38–126)
Anion gap: 8 (ref 5–15)
BUN: 18 mg/dL (ref 8–23)
CO2: 25 mmol/L (ref 22–32)
Calcium: 8.6 mg/dL — ABNORMAL LOW (ref 8.9–10.3)
Chloride: 108 mmol/L (ref 98–111)
Creatinine, Ser: 0.96 mg/dL (ref 0.44–1.00)
GFR calc Af Amer: >60 mL/min (ref >60)
GFR calc non Af Amer: 57 mL/min — ABNORMAL LOW (ref >60)
Glucose, Bld: 185 mg/dL — ABNORMAL HIGH (ref 70–99)
Potassium: 3.4 mmol/L — ABNORMAL LOW (ref 3.5–5.1)
Sodium: 141 mmol/L (ref 135–145)
Total Bilirubin: 0.7 mg/dL (ref 0.3–1.2)
Total Protein: 6.9 g/dL (ref 6.5–8.1)

## 2020-02-12 LAB — FOLATE: Folate: 9.2 ng/mL (ref >5.9)

## 2020-02-12 LAB — CBC WITH DIFFERENTIAL/PLATELET
Abs Immature Granulocytes: 0.05 10*3/uL (ref 0.00–0.07)
Basophils Absolute: 0 10*3/uL (ref 0.0–0.1)
Basophils Relative: 0 %
Eosinophils Absolute: 0.3 10*3/uL (ref 0.0–0.5)
Eosinophils Relative: 4 %
HCT: 29.3 % — ABNORMAL LOW (ref 36.0–46.0)
Hemoglobin: 8.5 g/dL — ABNORMAL LOW (ref 12.0–15.0)
Immature Granulocytes: 1 %
Lymphocytes Relative: 17 %
Lymphs Abs: 1.6 10*3/uL (ref 0.7–4.0)
MCH: 27.2 pg (ref 26.0–34.0)
MCHC: 29 g/dL — ABNORMAL LOW (ref 30.0–36.0)
MCV: 93.6 fL (ref 80.0–100.0)
Monocytes Absolute: 0.8 10*3/uL (ref 0.1–1.0)
Monocytes Relative: 9 %
Neutro Abs: 6.5 10*3/uL (ref 1.7–7.7)
Neutrophils Relative %: 69 %
Platelets: 311 10*3/uL (ref 150–400)
RBC: 3.13 MIL/uL — ABNORMAL LOW (ref 3.87–5.11)
RDW: 17.2 % — ABNORMAL HIGH (ref 11.5–15.5)
WBC: 9.3 10*3/uL (ref 4.0–10.5)
nRBC: 0 % (ref 0.0–0.2)

## 2020-02-12 LAB — IRON AND TIBC
Iron: 44 ug/dL (ref 28–170)
Saturation Ratios: 14 % (ref 10.4–31.8)
TIBC: 315 ug/dL (ref 250–450)
UIBC: 271 ug/dL

## 2020-02-12 LAB — VITAMIN D 25 HYDROXY (VIT D DEFICIENCY, FRACTURES): Vit D, 25-Hydroxy: 24.79 ng/mL — ABNORMAL LOW (ref 30–100)

## 2020-02-12 LAB — VITAMIN B12: Vitamin B-12: 512 pg/mL (ref 180–914)

## 2020-02-12 LAB — FERRITIN: Ferritin: 22 ng/mL (ref 11–307)

## 2020-02-12 LAB — LACTATE DEHYDROGENASE: LDH: 203 U/L — ABNORMAL HIGH (ref 98–192)

## 2020-02-14 LAB — KAPPA/LAMBDA LIGHT CHAINS
Kappa free light chain: 34.8 mg/L — ABNORMAL HIGH (ref 3.3–19.4)
Kappa, lambda light chain ratio: 1.2 (ref 0.26–1.65)
Lambda free light chains: 29 mg/L — ABNORMAL HIGH (ref 5.7–26.3)

## 2020-02-15 LAB — PROTEIN ELECTROPHORESIS, SERUM
A/G Ratio: 0.9 (ref 0.7–1.7)
Albumin ELP: 3.1 g/dL (ref 2.9–4.4)
Alpha-1-Globulin: 0.2 g/dL (ref 0.0–0.4)
Alpha-2-Globulin: 0.7 g/dL (ref 0.4–1.0)
Beta Globulin: 1 g/dL (ref 0.7–1.3)
Gamma Globulin: 1.3 g/dL (ref 0.4–1.8)
Globulin, Total: 3.4 g/dL (ref 2.2–3.9)
Total Protein ELP: 6.5 g/dL (ref 6.0–8.5)

## 2020-02-16 ENCOUNTER — Encounter: Payer: Self-pay | Admitting: Family Medicine

## 2020-02-16 ENCOUNTER — Ambulatory Visit: Payer: Medicare Other | Admitting: Family Medicine

## 2020-02-16 NOTE — Progress Notes (Signed)
[name]     MRN: [id number]      DOB: 1941/11/21   HPI [name] is here for follow up and re-evaluation of chronic medical conditions, medication management and review of any available recent lab and radiology data.  Preventive health is updated, specifically  Cancer screening and Immunization.   Questions or concerns regarding consultations or procedures which the PT has had in the interim are  addressed. The PT denies any adverse reactions to current medications since the last visit.  There are no new concerns.  There are no specific complaints   ROS Denies recent fever or chills. Denies sinus pressure, nasal congestion, ear pain or sore throat. Denies chest congestion, productive cough or wheezing. C/o increased fatigue and intermittent chest discomfort Denies abdominal pain, nausea, vomiting,diarrhea or constipation.   Denies dysuria, frequency, hesitancy or incontinence. Denies uncontrolled  joint pain, swelling and limitation in mobility. Denies headaches, seizures, numbness, or tingling. Denies depression, anxiety or insomnia. Denies skin break down or rash.   PE  BP (!) 130/50   Pulse 76   Temp 98 F (36.7 C) (Temporal)   Resp 15   Ht 5\' 6"  (1.676 m)   Wt 264 lb (119.7 kg)   SpO2 96%   BMI 42.61 kg/m   Patient alert and oriented and in no cardiopulmonary distress.  HEENT: No facial asymmetry, EOMI,     Neck supple .  Chest: Clear to auscultation bilaterally.  CVS: S1, S2 no murmurs, no S3.Regular rate.  ABD: Soft non tender.   Ext: No edema  MS: decreased  ROM spine, shoulders, hips and knees.  Skin: Intact, no ulcerations or rash noted.  Psych: Good eye contact, normal affect. Memory intact not anxious or depressed appearing.  CNS: CN 2-12 intact, power,  normal throughout.no focal deficits noted.   Assessment & Plan  Diabetes mellitus, insulin dependent (IDDM), controlled ([place]) [name] is reminded of the importance of  commitment to daily physical activity for 30 minutes or more, as able and the need to limit carbohydrate intake to 30 to 60 grams per meal to help with blood sugar control.   The need to take medication as prescribed, test blood sugar as directed, and to call between visits if there is a concern that blood sugar is uncontrolled is also discussed.   [name] is reminded of the importance of daily foot exam, annual eye examination, and good blood sugar, blood pressure and cholesterol control. Diabetic Labs Latest Ref Rng & Units 02/12/2020 02/10/2020 01/04/2020 11/13/2019 09/24/2019  HbA1c <5.7 % of total Hgb - 7.3(H) - - -  Microalbumin Not Estab. ug/mL - - 504.1(H) - -  Micro/Creat Ratio 0 - 29 mg/g creat - - 261(H) - -  Chol <200 mg/dL - 173 - - -  HDL > OR = 50 mg/dL - 45(L) - - -  Calc LDL mg/dL (calc) - 109(H) - - -  Triglycerides <150 mg/dL - 94 - - -  Creatinine 0.44 - 1.00 mg/dL 0.96 1.11(H) - 0.88 1.14(H)   BP/Weight 02/11/2020 12/31/2019 11/13/2019 10/21/2019 09/23/2019 [id number]  Systolic BP [id number] 123456 [id number] 123456 [id number]  Diastolic BP 50 74 58 75 78 55 71  Wt. (Lbs) 264 263 268 264 261 263.8 -  BMI 42.61 42.45 43.26 42.61 42.13 42.58 -   Foot/eye exam completion dates Latest Ref Rng & Units 12/31/2019 08/19/2019  Eye Exam No Retinopathy - No Retinopathy  Foot Form Completion - Done -  Controlled, no change in medication      Depression with anxiety Improved, will lower dose of citalopram with a view to discontinuing in next 2 months  Morbid obesity  Patient re-educated about  the importance of commitment to a  minimum of 150 minutes of exercise per week as able.  The importance of healthy food choices with portion control discussed, as well as eating regularly and within a 12 hour window most days. The need to choose "clean , green" food 50 to 75% of the time is discussed, as well as to make water the primary drink and set a goal of 64 ounces water daily.      Weight /BMI 02/11/2020 12/31/2019 11/13/2019  WEIGHT 264 lb 263 lb 268 lb  HEIGHT 5\' 6"  5\' 6"  -  BMI 42.61 kg/m2 42.45 kg/m2 43.26 kg/m2      Malignant hypertension systolic pressure at goal and diastolic pressure is low, refer to Cardiology for evaluation, also c/o increased fatigue, poor exercise tolerance and intermittent chest pain  Hyperlipemia Hyperlipidemia:Low fat diet discussed and encouraged.   Lipid Panel  Lab Results  Component Value Date   CHOL 173 02/10/2020   HDL 45 (L) 02/10/2020   LDLCALC 109 (H) 02/10/2020   TRIG 94 02/10/2020   CHOLHDL 3.8 02/10/2020   Needs to lower fat intake , no med change

## 2020-02-16 NOTE — Assessment & Plan Note (Signed)
systolic pressure at goal and diastolic pressure is low, refer to Cardiology for evaluation, also c/o increased fatigue, poor exercise tolerance and intermittent chest pain

## 2020-02-16 NOTE — Assessment & Plan Note (Signed)
Improved, will lower dose of citalopram with a view to discontinuing in next 2 months

## 2020-02-16 NOTE — Assessment & Plan Note (Signed)
Hyperlipidemia:Low fat diet discussed and encouraged.   Lipid Panel  Lab Results  Component Value Date   CHOL 173 02/10/2020   HDL 45 (L) 02/10/2020   LDLCALC 109 (H) 02/10/2020   TRIG 94 02/10/2020   CHOLHDL 3.8 02/10/2020   Needs to lower fat intake , no med change

## 2020-02-16 NOTE — Assessment & Plan Note (Signed)
  Patient re-educated about  the importance of commitment to a  minimum of 150 minutes of exercise per week as able.  The importance of healthy food choices with portion control discussed, as well as eating regularly and within a 12 hour window most days. The need to choose "clean , green" food 50 to 75% of the time is discussed, as well as to make water the primary drink and set a goal of 64 ounces water daily.    Weight /BMI 02/11/2020 12/31/2019 11/13/2019  WEIGHT 264 lb 263 lb 268 lb  HEIGHT 5\' 6"  5\' 6"  -  BMI 42.61 kg/m2 42.45 kg/m2 43.26 kg/m2

## 2020-02-16 NOTE — Assessment & Plan Note (Signed)
Ms. Monell is reminded of the importance of commitment to daily physical activity for 30 minutes or more, as able and the need to limit carbohydrate intake to 30 to 60 grams per meal to help with blood sugar control.   The need to take medication as prescribed, test blood sugar as directed, and to call between visits if there is a concern that blood sugar is uncontrolled is also discussed.   Ms. Leuschen is reminded of the importance of daily foot exam, annual eye examination, and good blood sugar, blood pressure and cholesterol control. Diabetic Labs Latest Ref Rng & Units 02/12/2020 02/10/2020 01/04/2020 11/13/2019 09/24/2019  HbA1c <5.7 % of total Hgb - 7.3(H) - - -  Microalbumin Not Estab. ug/mL - - 504.1(H) - -  Micro/Creat Ratio 0 - 29 mg/g creat - - 261(H) - -  Chol <200 mg/dL - 173 - - -  HDL > OR = 50 mg/dL - 45(L) - - -  Calc LDL mg/dL (calc) - 109(H) - - -  Triglycerides <150 mg/dL - 94 - - -  Creatinine 0.44 - 1.00 mg/dL 0.96 1.11(H) - 0.88 1.14(H)   BP/Weight 02/11/2020 12/31/2019 11/13/2019 10/21/2019 09/23/2019 [id number]  Systolic BP [id number] [id number] 123456 [id number] 123456 [id number] 0000000  Diastolic BP 50 74 58 75 78 55 71  Wt. (Lbs) 264 263 268 264 261 263.8 -  BMI 42.61 42.45 43.26 42.61 42.13 42.58 -   Foot/eye exam completion dates Latest Ref Rng & Units 12/31/2019 08/19/2019  Eye Exam No Retinopathy - No Retinopathy  Foot Form Completion - Done -   Controlled, no change in medication

## 2020-02-19 ENCOUNTER — Ambulatory Visit (HOSPITAL_COMMUNITY): Payer: Medicare Other | Admitting: Nurse Practitioner

## 2020-02-25 ENCOUNTER — Other Ambulatory Visit: Payer: Self-pay | Admitting: Family Medicine

## 2020-02-25 ENCOUNTER — Encounter (INDEPENDENT_AMBULATORY_CARE_PROVIDER_SITE_OTHER): Payer: Medicare Other | Admitting: Ophthalmology

## 2020-03-02 ENCOUNTER — Ambulatory Visit (HOSPITAL_COMMUNITY)
Admission: RE | Admit: 2020-03-02 | Discharge: 2020-03-02 | Disposition: A | Discharge status: Home / Self Care | Payer: Medicare Other | Source: Ambulatory Visit | Attending: Nurse Practitioner | Admitting: Nurse Practitioner

## 2020-03-02 ENCOUNTER — Other Ambulatory Visit: Payer: Self-pay

## 2020-03-02 DIAGNOSIS — D472 Monoclonal gammopathy: Secondary | ICD-10-CM | POA: Diagnosis not present

## 2020-03-04 ENCOUNTER — Other Ambulatory Visit: Payer: Self-pay

## 2020-03-04 ENCOUNTER — Encounter (HOSPITAL_COMMUNITY): Payer: Self-pay | Admitting: Nurse Practitioner

## 2020-03-04 ENCOUNTER — Inpatient Hospital Stay (HOSPITAL_COMMUNITY): Payer: Medicare Other | Attending: Hematology | Admitting: Nurse Practitioner

## 2020-03-04 DIAGNOSIS — E119 Type 2 diabetes mellitus without complications: Secondary | ICD-10-CM | POA: Insufficient documentation

## 2020-03-04 DIAGNOSIS — F419 Anxiety disorder, unspecified: Secondary | ICD-10-CM | POA: Insufficient documentation

## 2020-03-04 DIAGNOSIS — E669 Obesity, unspecified: Secondary | ICD-10-CM | POA: Diagnosis not present

## 2020-03-04 DIAGNOSIS — Z794 Long term (current) use of insulin: Secondary | ICD-10-CM | POA: Diagnosis not present

## 2020-03-04 DIAGNOSIS — J449 Chronic obstructive pulmonary disease, unspecified: Secondary | ICD-10-CM | POA: Insufficient documentation

## 2020-03-04 DIAGNOSIS — D472 Monoclonal gammopathy: Secondary | ICD-10-CM | POA: Diagnosis not present

## 2020-03-04 DIAGNOSIS — G4733 Obstructive sleep apnea (adult) (pediatric): Secondary | ICD-10-CM | POA: Insufficient documentation

## 2020-03-04 DIAGNOSIS — Z7952 Long term (current) use of systemic steroids: Secondary | ICD-10-CM | POA: Diagnosis not present

## 2020-03-04 DIAGNOSIS — E785 Hyperlipidemia, unspecified: Secondary | ICD-10-CM | POA: Insufficient documentation

## 2020-03-04 DIAGNOSIS — E079 Disorder of thyroid, unspecified: Secondary | ICD-10-CM | POA: Insufficient documentation

## 2020-03-04 DIAGNOSIS — Z79899 Other long term (current) drug therapy: Secondary | ICD-10-CM | POA: Insufficient documentation

## 2020-03-04 DIAGNOSIS — D509 Iron deficiency anemia, unspecified: Secondary | ICD-10-CM | POA: Insufficient documentation

## 2020-03-04 DIAGNOSIS — F329 Major depressive disorder, single episode, unspecified: Secondary | ICD-10-CM | POA: Diagnosis not present

## 2020-03-04 DIAGNOSIS — N1832 Chronic kidney disease, stage 3b: Secondary | ICD-10-CM | POA: Insufficient documentation

## 2020-03-04 DIAGNOSIS — I10 Essential (primary) hypertension: Secondary | ICD-10-CM | POA: Diagnosis not present

## 2020-03-04 DIAGNOSIS — Z87891 Personal history of nicotine dependence: Secondary | ICD-10-CM | POA: Insufficient documentation

## 2020-03-04 NOTE — Progress Notes (Signed)
[name], [place] [address]   CLINIC:  Medical Oncology/Hematology  PCP:  Fayrene Helper, MD [address], [address] [place] [address] [phone]   REASON FOR VISIT: Follow-up for anemia and MGUS  CURRENT THERAPY: Iron infusions   INTERVAL HISTORY:  [name] 79 y.o. female returns for routine follow-up for anemia and MGUS.  Patient reports she is more fatigued than normal.  She reports she likes to sleep a lot during the day.  She does report she is having some rectal bleeding due to her hemorrhoids.  She has a follow-up appointment with GI this month. Denies any nausea, vomiting, or diarrhea. Denies any new pains. Had not noticed any recent bleeding such as epistaxis, hematuria or hematochezia. Denies recent chest pain on exertion, shortness of breath on minimal exertion, pre-syncopal episodes, or palpitations. Denies any numbness or tingling in hands or feet. Denies any recent fevers, infections, or recent hospitalizations. Patient reports appetite at 25% and energy level at 25%.  She is maintaining her weight at this time.    REVIEW OF SYSTEMS:  Review of Systems  Constitutional: Positive for fatigue.  Respiratory: Positive for shortness of breath.   Gastrointestinal: Positive for blood in stool and constipation.  Neurological: Positive for dizziness.     PAST MEDICAL/SURGICAL HISTORY:  Past Medical History:  Diagnosis Date  . Anemia in chronic renal disease 12/30/2015  . Anxiety   . Arthritis    "all over" (01/19/2014)  . Arthritis, lumbar spine   . Carpal tunnel syndrome   . Chronic bronchitis ([place])    "got it q yr for awhile; hasn't had it in awhile" (01/19/2014)  . Chronic kidney disease (CKD) stage G3b/A1, moderately decreased glomerular filtration rate (GFR) between 30-44 mL/min/1.73 square meter and albuminuria creatinine ratio less than 30 mg/g 09/14/2009   Qualifier: Diagnosis of  By: Moshe Cipro MD, Joycelyn Schmid      . Chronic renal disease, stage 3, moderately decreased glomerular filtration rate (GFR) between 30-59 mL/min/1.73 square meter 09/14/2009   Qualifier: Diagnosis of  By: Moshe Cipro MD, Joycelyn Schmid    . Complication of anesthesia    combative  . COPD (chronic obstructive pulmonary disease) ([place])   . Depression   . Diverticulosis 07/2004   Colonscopy Dr Gala Romney  . Esophagitis, erosive 2009  . Exertional shortness of breath   . Gastroesophageal reflux   . [id number]    "usually a couple times/wk" (01/19/2014)  . Heart murmur    [place] cardiology [place], he told her she did not need to come back.  . Hyperlipidemia   . Hypertension   . Impingement syndrome, shoulder   . Iron deficiency anemia   . Mitral regurgitation   . Myasthenia gravis   . Myasthenia gravis in remission ([place]) 11/24/2014  . Myasthenia gravis in remission ([place])   . Obesity   . OSA on CPAP    Negative on last sleep study  . Pneumonia 01/2012  . Pulmonary HTN ([place])   . Schatzki's ring    Last EGD w/ dilation 02/08/11, 2009 & 2007  . Seasonal allergies   . Shoulder pain   . Thyroid disease    "used to take RX; they took me off it" (01/19/2014)  . Tobacco abuse   . Type II diabetes mellitus ([place])    Past Surgical History:  Procedure Laterality Date  . [place] VITRECTOMY WITH 20 GAUGE MVR PORT Left 01/19/2014   Procedure: 25 GAUGE  PARS PLANA VITRECTOMY WITH 20 GAUGE MVR PORT; MEMBRAME PEEL; SERUM PATCH; LASER TREATMENT; C3F8;  Surgeon: Hayden Pedro, MD;  Location: [facility];  Service: Ophthalmology;  Laterality: Left;  . ABDOMINAL HYSTERECTOMY    . AGILE CAPSULE N/A 08/05/2019   Procedure: AGILE CAPSULE;  Surgeon: Daneil Dolin, MD;  [facility];  Service: Endoscopy;  Laterality: N/A;  7:30am  . APPENDECTOMY    . CARPAL TUNNEL RELEASE Bilateral   . CATARACT EXTRACTION W/PHACO Left 10/21/2013   Procedure: LEFT CATARACT EXTRACTION PHACO AND INTRAOCULAR LENS PLACEMENT (IOC);  Surgeon: Marylynn Pearson,  MD;  Location: [facility];  Service: Ophthalmology;  Laterality: Left;  . CHOLECYSTECTOMY    . COLONOSCOPY  05/08/2012   [id number] and external hemorrhoids; colonic diverticulosis  . COLONOSCOPY WITH PROPOFOL N/A 02/26/2019   two simple adenomas, diverticulosis,  and internal hemorrhoids.   . ESOPHAGEAL DILATION     "more than 3 times" (01/19/2014)  . ESOPHAGOGASTRODUODENOSCOPY  02/08/11   [place]-Distal esophageal erosion consistent with mild erosive reflux   esophagitis/ Noncritical Schatzki ring, small hiatal hernia otherwise upper/ gastrointestinal tract appeared unremarkable, status post passage  of a Maloney dilation to biopsy disruption of the ring described  . ESOPHAGOGASTRODUODENOSCOPY  04/2012   2 tandem incomplete distal esophagea rings s/p dilation.   . ESOPHAGOGASTRODUODENOSCOPY N/A 09/16/2014   Dr. Gala Romney: Schatzki ring status post dilation/disruption.  Hiatal hernia.  Marland Kitchen ESOPHAGOGASTRODUODENOSCOPY (EGD) WITH PROPOFOL N/A 12/08/2018   EGD with mild Schatzki ring s/p dilation, small hiatal hernia, otherwise normal  . EYE SURGERY     cataracts, bilateral  . GIVENS CAPSULE STUDY N/A 09/29/2019   Procedure: GIVENS CAPSULE STUDY;  Surgeon: Daneil Dolin, MD;  [facility];  Service: Endoscopy;  Laterality: N/A;  7:30am  . INCONTINENCE SURGERY  08/26/09   [place]  . JOINT REPLACEMENT     right total knee  . MALONEY DILATION N/A 09/16/2014   Procedure: Venia Minks DILATION;  Surgeon: Daneil Dolin, MD;  [facility];  Service: Endoscopy;  Laterality: N/A;  . Venia Minks DILATION N/A 12/08/2018   Procedure: Venia Minks DILATION;  Surgeon: Daneil Dolin, MD;  [facility];  Service: Endoscopy;  Laterality: N/A;  . PARS PLANA VITRECTOMY W/ REPAIR OF MACULAR HOLE Left 01/19/2014  . POLYPECTOMY  02/26/2019   Procedure: POLYPECTOMY;  Surgeon: Danie Binder, MD;  [facility];  Service: Endoscopy;;  ascending colon polyps x2  . SAVORY DILATION N/A 09/16/2014    Procedure: SAVORY DILATION;  Surgeon: Daneil Dolin, MD;  [facility];  Service: Endoscopy;  Laterality: N/A;  . TONSILLECTOMY    . TOTAL KNEE ARTHROPLASTY Right 05/13/07   Dr. Aline Brochure  . TRIGGER FINGER RELEASE Right 06/26/2018   Procedure: RIGHT INDEX FINGER TRIGGER FINGER/A-1 PULLEY RELEASE;  Surgeon: Carole Civil, MD;  Location[facility];  Service: Orthopedics;  Laterality: Right;     SOCIAL HISTORY:  Social History   Socioeconomic History  . Marital status: Single    Spouse name: Not on file  . Number of children: 2  . Years of education: Not on file  . Highest education level: Not on file  Occupational History  . Occupation: disabled    Employer: RETIRED  Tobacco Use  . Smoking status: Former Smoker    Packs/day: 0.50    Years: 25.00    Pack years: 12.50    Types: Cigarettes    Quit date: 01/01/2004    Years since  quitting: 16.1  . Smokeless tobacco: Never Used  Substance and Sexual Activity  . Alcohol use: No  . Drug use: No  . Sexual activity: Never    Birth control/protection: Surgical  Other Topics Concern  . Not on file  Social History Narrative   Patient lives at home by herself   Patient drinks one cup of coffee a day   Patient is right handed.   Social Determinants of Health   Financial Resource Strain:   . Difficulty of Paying Living Expenses: Not on file  Food Insecurity:   . Worried About Charity fundraiser in the Last Year: Not on file  . Ran Out of Food in the Last Year: Not on file  Transportation Needs:   . Lack of Transportation (Medical): Not on file  . Lack of Transportation (Non-Medical): Not on file  Physical Activity:   . Days of Exercise per Week: Not on file  . Minutes of Exercise per Session: Not on file  Stress:   . Feeling of Stress : Not on file  Social Connections: Moderately Isolated  . Frequency of Communication with Friends and Family: More than three times a week  . Frequency of Social Gatherings with  Friends and Family: Never  . Attends Religious Services: Never  . Active Member of Clubs or Organizations: No  . Attends Archivist Meetings: Never  . Marital Status: Never married  Intimate Partner Violence: Not At Risk  . Fear of Current or Ex-Partner: No  . Emotionally Abused: No  . Physically Abused: No  . Sexually Abused: No    FAMILY HISTORY:  Family History  Problem Relation Age of Onset  . Cancer Mother   . Heart disease Mother   . Cancer Father   . Heart disease Father   . Diabetes Sister   . Hypertension Brother   . Heart disease Brother   . Cancer Sister   . Kidney failure Sister   . Diabetes Son   . Hypertension Son   . Hypertension Daughter   . Colon cancer Neg Hx     CURRENT MEDICATIONS:  Outpatient Encounter Medications as of 03/04/2020  Medication Sig  . amLODipine (NORVASC) 10 MG tablet TAKE 1 TABLET BY MOUTH ONCE A DAY.  Marland Kitchen B-D ULTRAFINE III SHORT PEN 31G X 8 MM MISC USE ONCE DAILY WITH LANTUS SOLOSTAR PEN.  . brimonidine-timolol (COMBIGAN) 0.2-0.5 % ophthalmic solution Place 1 drop into both eyes every 12 (twelve) hours.  . cetirizine (ZYRTEC) 10 MG tablet TAKE 1 TABLET BY MOUTH ONCE A DAY.  Marland Kitchen Cholecalciferol (VITAMIN D3) 2000 units TABS Take 2,000 Units by mouth at bedtime.  . citalopram (CELEXA) 10 MG tablet Take 1 tablet (10 mg total) by mouth daily.  Marland Kitchen Cod Liver Oil CAPS Take by mouth. 500 MG per patient  . Cranberry 1000 MG CAPS Take 1,000 mg by mouth daily.  . cycloSPORINE (RESTASIS) 0.05 % ophthalmic emulsion Place 1 drop into both eyes 2 (two) times daily.   Marland Kitchen diltiazem (CARDIZEM CD) 240 MG 24 hr capsule TAKE (1) CAPSULE BY MOUTH TWICE DAILY.  Marland Kitchen ezetimibe (ZETIA) 10 MG tablet TAKE 1 TABLET BY MOUTH ONCE A DAY.  Marland Kitchen gabapentin (NEURONTIN) 300 MG capsule TAKE (1) CAPSULE BY MOUTH TWICE DAILY.  . hydrALAZINE (APRESOLINE) 50 MG tablet TAKE 1 TABLET BY MOUTH THREE TIMES A DAY.  Marland Kitchen HYDROcodone-acetaminophen (NORCO) 10-325 MG tablet Take one  tablet by mouth two times daily for back pain  . Lancets (  ONETOUCH DELICA PLUS [id number]) MISC USE AS DIRECTED 3 TIMES DAILY FOR BLOOD GLUCOSE TESTING.  . LANTUS SOLOSTAR 100 UNIT/ML Solostar Pen INJECT 30 UNITS SUBCUTANEOUSLY ONCE DAILY.  Marland Kitchen linaclotide (LINZESS) 72 MCG capsule Take 1 capsule (72 mcg total) by mouth daily before breakfast.  . losartan (COZAAR) 100 MG tablet TAKE (1) TABLET BY MOUTH DAILY.  . Multiple Vitamin (MULTIVITAMIN WITH MINERALS) TABS tablet Take 1 tablet by mouth at bedtime.  Glory Rosebush VERIO test strip USE AS DIRECTED TO TEST BLOOD GLUCOSE 3 TIMES DAILY.  . pantoprazole (PROTONIX) 40 MG tablet TAKE 1 TABLET BY MOUTH ONCE A DAY, 30 MINUTES BEFORE BREAKFAST.  Marland Kitchen predniSONE (DELTASONE) 5 MG tablet Take 1 tablet (5 mg total) by mouth daily.  Marland Kitchen pyridostigmine (MESTINON) 60 MG tablet TAKE (1) TABLET BY MOUTH THREE TIMES A DAY.  . rosuvastatin (CRESTOR) 40 MG tablet TAKE (1) TABLET BY MOUTH AT BEDTIME.  Marland Kitchen solifenacin (VESICARE) 10 MG tablet TAKE 1 TABLET BY MOUTH ONCE A DAY.  Marland Kitchen spironolactone (ALDACTONE) 25 MG tablet TAKE 1 TABLET BY MOUTH ONCE A DAY.  . TRADJENTA 5 MG TABS tablet TAKE 1 TABLET BY MOUTH ONCE A DAY.  Marland Kitchen UNABLE TO FIND Diabetic shoes x 1 Inserts x 3 DX E11.9  . venlafaxine XR (EFFEXOR-XR) 150 MG 24 hr capsule TAKE 1 CAPSULE BY MOUTH DAILY.  . [DISCONTINUED] linaclotide (LINZESS) 145 MCG CAPS capsule Take 1 capsule (145 mcg total) by mouth daily before breakfast.  . ACETAMINOPHEN EXTRA STRENGTH 500 MG tablet TAKE (1) TABLET BY MOUTH AT BEDTIME. (Patient not taking: Reported on 03/04/2020)  . fluticasone (FLONASE) 50 MCG/ACT nasal spray PLACE 2 SPRAYS INTO BOTH NOSTRILS DAILY. (Patient not taking: Reported on 03/04/2020)  . ondansetron (ZOFRAN-ODT) 8 MG disintegrating tablet Take 1 tablet (8 mg total) by mouth every 8 (eight) hours as needed for nausea or vomiting. (Patient not taking: Reported on 03/04/2020)  . PROAIR HFA 108 (90 Base) MCG/ACT inhaler INHALE 2 PUFFS BY  MOUTH EVERY 6 HOURS AS NEEDED . (Patient not taking: Reported on 03/04/2020)  . [DISCONTINUED] azithromycin (ZITHROMAX) 250 MG tablet Take two tablets by mouth on day one, then take one tablet once daily for an additional four days  . [DISCONTINUED] HYDROcodone-acetaminophen (NORCO) 10-325 MG tablet Take 1 tablet by mouth 2 (two) times daily.  . [DISCONTINUED] HYDROcodone-acetaminophen (NORCO) 10-325 MG tablet Take one tablet by mouth two times daily for back pain  . [DISCONTINUED] ondansetron (ZOFRAN) 4 MG tablet TAKE (1) TABLET BY MOUTH EVERY FOUR HOURS AS NEEDED FOR NAUSEA. (Patient not taking: Reported on 03/04/2020)   No facility-administered encounter medications on file as of 03/04/2020.    ALLERGIES:  No Known Allergies   PHYSICAL EXAM:  ECOG Performance status: 1  Vitals:   03/04/20 1005  BP: (!) 159/54  Pulse: 76  Resp: 19  Temp: (!) 97.3 F (36.3 C)  SpO2: 100%   Filed Weights   03/04/20 1005  Weight: 254 lb 1.6 oz (115.3 kg)    Physical Exam Constitutional:      Appearance: She is obese.  Cardiovascular:     Rate and Rhythm: Normal rate and regular rhythm.     Heart sounds: Normal heart sounds.  Pulmonary:     Effort: Pulmonary effort is normal.     Breath sounds: Normal breath sounds.  Abdominal:     General: Bowel sounds are normal.     Palpations: Abdomen is soft.  Musculoskeletal:        General: Normal  range of motion.  Skin:    General: Skin is warm.  Neurological:     Mental Status: She is alert and oriented to person, place, and time. Mental status is at baseline.  Psychiatric:        Mood and Affect: Mood normal.        Behavior: Behavior normal.        Thought Content: Thought content normal.        Judgment: Judgment normal.      LABORATORY DATA:  I have reviewed the labs as listed.  CBC    Component Value Date/Time   WBC 9.3 02/12/2020 1235   RBC 3.13 (L) 02/12/2020 1235   HGB 8.5 (L) 02/12/2020 1235   HCT 29.3 (L) 02/12/2020 1235    PLT 311 02/12/2020 1235   MCV 93.6 02/12/2020 1235   MCH 27.2 02/12/2020 1235   MCHC 29.0 (L) 02/12/2020 1235   RDW 17.2 (H) 02/12/2020 1235   LYMPHSABS 1.6 02/12/2020 1235   MONOABS 0.8 02/12/2020 1235   EOSABS 0.3 02/12/2020 1235   BASOSABS 0.0 02/12/2020 1235   CMP Latest Ref Rng & Units 02/12/2020 02/10/2020 11/13/2019  Glucose 70 - 99 mg/dL 185(H) 169(H) 107(H)  BUN 8 - 23 mg/dL 18 20 17   Creatinine 0.44 - 1.00 mg/dL 0.96 1.11(H) 0.88  Sodium 135 - 145 mmol/L 141 139 140  Potassium 3.5 - 5.1 mmol/L 3.4(L) 4.5 4.1  Chloride 98 - 111 mmol/L 108 106 106  CO2 22 - 32 mmol/L 25 26 26   Calcium 8.9 - 10.3 mg/dL 8.6(L) 8.9 9.1  Total Protein 6.5 - 8.1 g/dL 6.9 6.3 7.6  Total Bilirubin 0.3 - 1.2 mg/dL 0.7 0.3 0.5  Alkaline Phos 38 - 126 U/L 124 - 143(H)  AST 15 - 41 U/L 16 11 16   ALT 0 - 44 U/L 15 10 14    DIAGNOSTIC IMAGING:  I have independently reviewed the bone survey scan and discussed with the patient.  I personally performed a face-to-face visit.  All questions were answered to patient's stated satisfaction. Encouraged patient to call with any new concerns or questions before his next visit to the cancer center and we can certain see him sooner, if needed.     ASSESSMENT & PLAN:   Monoclonal gammopathy of unknown significance (MGUS) 1.  Normocytic anemia: - Combination anemia from iron deficiency, CKD, and mild blood loss. -  Last Feraheme infusion on 07/01/2019 - Last colonoscopy on 02/26/2019 showed normal terminal ileum, 2 tubular adenomas in the proximal ascending colon removed, diverticulosis in the ascending, descending, sigmoid and rectosigmoid colon, external and internal hemorrhoids. -Patient reports she is having some mild bright red bleeding per rectum.  She recently had a capsule study done.  She follows up with GI on the 23rd of this month. -Labs done on 02/12/2020 hemoglobin 8.5, ferritin 22, percent saturation 14 -We will set her up with 2 infusions of IV iron. -  We will see her back in 3 months with repeat blood work.  2.  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  However immunofixation in March and November 2018 was positive for IgA kappa. -Free light chain ratio is normal, normal calcium.   -No other intervention necessary at this time. - He is having pain in her hips and back. -Labs done on 02/12/2020 showed no M spike. -Bone survey done on 03/02/2020 showed subtle lucency in the distal aspect of the left femoral shaft.  Consider  AP and LAT to further look to see if it soft tissue or actual bone lesion. -We will order AP and LAT of her leg today. -We will see her back in 3 months.      Orders placed this encounter:  Orders Placed This Encounter  Procedures  . DG FEMUR MIN 2 VIEWS LEFT  . Lactate dehydrogenase  . Protein electrophoresis, serum  . Kappa/lambda light chains  . CBC with Differential/Platelet  . Comprehensive metabolic panel  . Ferritin  . Iron and TIBC  . Vitamin B12  . VITAMIN D 25 Hydroxy (Vit-D Deficiency, Fractures)      Francene Finders, FNP-C [place] [phone]

## 2020-03-04 NOTE — Assessment & Plan Note (Addendum)
1.  Normocytic anemia: - Combination anemia from iron deficiency, CKD, and mild blood loss. -  Last Feraheme infusion on 07/01/2019 - Last colonoscopy on 02/26/2019 showed normal terminal ileum, 2 tubular adenomas in the proximal ascending colon removed, diverticulosis in the ascending, descending, sigmoid and rectosigmoid colon, external and internal hemorrhoids. -Patient reports she is having some mild bright red bleeding per rectum.  She recently had a capsule study done.  She follows up with GI on the 23rd of this month. -Labs done on 02/12/2020 hemoglobin 8.5, ferritin 22, percent saturation 14 -We will set her up with 2 infusions of IV iron. - We will see her back in 3 months with repeat blood work.  2.  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  However immunofixation in March and November 2018 was positive for IgA kappa. -Free light chain ratio is normal, normal calcium.   -No other intervention necessary at this time. - He is having pain in her hips and back. -Labs done on 02/12/2020 showed no M spike. -Bone survey done on 03/02/2020 showed subtle lucency in the distal aspect of the left femoral shaft.  Consider AP and LAT to further look to see if it soft tissue or actual bone lesion. -We will order AP and LAT of her leg today. -We will see her back in 3 months.

## 2020-03-04 NOTE — Patient Instructions (Signed)
[facility] at [facility] Discharge Instructions  Follow up in 3 months with lab s   Thank you for choosing [facility] at [facility] to provide your oncology and hematology care.  To afford each patient quality time with our provider, please arrive at least 15 minutes before your scheduled appointment time.   If you have a lab appointment with the Cancer Center please come in thru the Main Entrance and check in at the main information desk.  You need to re-schedule your appointment should you arrive 10 or more minutes late.  We strive to give you quality time with our providers, and arriving late affects you and other patients whose appointments are after yours.  Also, if you no show three or more times for appointments you may be dismissed from the clinic at the providers discretion.     Again, thank you for choosing [facility].  Our hope is that these requests will decrease the amount of time that you wait before being seen by our physicians.       _____________________________________________________________  Should you have questions after your visit to [facility], please contact our office at [phone] between the hours of 8:00 a.m. and 4:30 p.m.  Voicemails left after 4:00 p.m. will not be returned until the following business day.  For prescription refill requests, have your pharmacy contact our office and allow 72 hours.    Due to Covid, you will need to wear a mask upon entering the hospital. If you do not have a mask, a mask will be given to you at the Main Entrance upon arrival. For doctor visits, patients may have 1 support person with them. For treatment visits, patients can not have anyone with them due to social distancing guidelines and our immunocompromised population.      

## 2020-03-08 ENCOUNTER — Inpatient Hospital Stay (HOSPITAL_COMMUNITY): Payer: Medicare Other

## 2020-03-08 ENCOUNTER — Other Ambulatory Visit: Payer: Self-pay

## 2020-03-08 VITALS — BP 122/54 | HR 53 | Temp 97.8°F | Resp 18

## 2020-03-08 DIAGNOSIS — Z87891 Personal history of nicotine dependence: Secondary | ICD-10-CM | POA: Diagnosis not present

## 2020-03-08 DIAGNOSIS — E119 Type 2 diabetes mellitus without complications: Secondary | ICD-10-CM | POA: Diagnosis not present

## 2020-03-08 DIAGNOSIS — J449 Chronic obstructive pulmonary disease, unspecified: Secondary | ICD-10-CM | POA: Diagnosis not present

## 2020-03-08 DIAGNOSIS — I10 Essential (primary) hypertension: Secondary | ICD-10-CM | POA: Diagnosis not present

## 2020-03-08 DIAGNOSIS — N1832 Chronic kidney disease, stage 3b: Secondary | ICD-10-CM | POA: Diagnosis not present

## 2020-03-08 DIAGNOSIS — G4733 Obstructive sleep apnea (adult) (pediatric): Secondary | ICD-10-CM | POA: Diagnosis not present

## 2020-03-08 DIAGNOSIS — E079 Disorder of thyroid, unspecified: Secondary | ICD-10-CM | POA: Diagnosis not present

## 2020-03-08 DIAGNOSIS — D508 Other iron deficiency anemias: Secondary | ICD-10-CM

## 2020-03-08 DIAGNOSIS — E785 Hyperlipidemia, unspecified: Secondary | ICD-10-CM | POA: Diagnosis not present

## 2020-03-08 DIAGNOSIS — D509 Iron deficiency anemia, unspecified: Secondary | ICD-10-CM | POA: Diagnosis not present

## 2020-03-08 DIAGNOSIS — D472 Monoclonal gammopathy: Secondary | ICD-10-CM | POA: Diagnosis not present

## 2020-03-08 DIAGNOSIS — Z7952 Long term (current) use of systemic steroids: Secondary | ICD-10-CM | POA: Diagnosis not present

## 2020-03-08 DIAGNOSIS — Z79899 Other long term (current) drug therapy: Secondary | ICD-10-CM | POA: Diagnosis not present

## 2020-03-08 DIAGNOSIS — Z794 Long term (current) use of insulin: Secondary | ICD-10-CM | POA: Diagnosis not present

## 2020-03-08 MED ORDER — SODIUM CHLORIDE 0.9 % IV SOLN: INTRAVENOUS | Status: DC

## 2020-03-08 MED ORDER — SODIUM CHLORIDE 0.9 % IV SOLN
510.0000 mg | Freq: Once | INTRAVENOUS | Status: AC
  Administered 2020-03-08: 510 mg via INTRAVENOUS
  Filled 2020-03-08: qty 510

## 2020-03-08 NOTE — Progress Notes (Signed)
Patient presents today for Feraheme infusion. Vital signs stable. Patient has no complaints of any pain or significant changes since her last visit. MAR reviewed and updated.   Feraheme given today per MD orders. Tolerated infusion without adverse affects. Vital signs stable. No complaints at this time. Discharged from clinic ambulatory. F/U with [facility] as scheduled.

## 2020-03-08 NOTE — Patient Instructions (Signed)
[facility] at [facility]  Discharge Instructions:   _______________________________________________________________  Thank you for choosing [facility] at [facility] to provide your oncology and hematology care.  To afford each patient quality time with our providers, please arrive at least 15 minutes before your scheduled appointment.  You need to re-schedule your appointment if you arrive 10 or more minutes late.  We strive to give you quality time with our providers, and arriving late affects you and other patients whose appointments are after yours.  Also, if you no show three or more times for appointments you may be dismissed from the clinic.  Again, thank you for choosing  Can[facility] Center at [facility]. Our hope is that these requests will allow you access to exceptional care and in a timely manner. _______________________________________________________________  If you have questions after your visit, please contact our office at [phone] between the hours of 8:30 a.m. and 5:00 p.m. Voicemails left after 4:30 p.m. will not be returned until the following business day. _______________________________________________________________  For prescription refill requests, have your pharmacy contact our office. _______________________________________________________________  Recommendations made by the consultant and any test results will be sent to your referring physician. _______________________________________________________________ 

## 2020-03-15 ENCOUNTER — Other Ambulatory Visit: Payer: Self-pay

## 2020-03-15 ENCOUNTER — Inpatient Hospital Stay (HOSPITAL_COMMUNITY): Payer: Medicare Other

## 2020-03-15 ENCOUNTER — Encounter (HOSPITAL_COMMUNITY): Payer: Self-pay

## 2020-03-15 ENCOUNTER — Ambulatory Visit (HOSPITAL_COMMUNITY)
Admission: RE | Admit: 2020-03-15 | Discharge: 2020-03-15 | Disposition: A | Discharge status: Home / Self Care | Payer: Medicare Other | Source: Ambulatory Visit | Attending: Nurse Practitioner | Admitting: Nurse Practitioner

## 2020-03-15 ENCOUNTER — Other Ambulatory Visit (HOSPITAL_COMMUNITY): Payer: Self-pay | Admitting: Nurse Practitioner

## 2020-03-15 VITALS — BP 102/69 | HR 55 | Temp 97.8°F | Resp 18

## 2020-03-15 DIAGNOSIS — N1832 Chronic kidney disease, stage 3b: Secondary | ICD-10-CM | POA: Diagnosis not present

## 2020-03-15 DIAGNOSIS — I10 Essential (primary) hypertension: Secondary | ICD-10-CM | POA: Diagnosis not present

## 2020-03-15 DIAGNOSIS — Z79899 Other long term (current) drug therapy: Secondary | ICD-10-CM | POA: Diagnosis not present

## 2020-03-15 DIAGNOSIS — J449 Chronic obstructive pulmonary disease, unspecified: Secondary | ICD-10-CM | POA: Diagnosis not present

## 2020-03-15 DIAGNOSIS — Z87891 Personal history of nicotine dependence: Secondary | ICD-10-CM | POA: Diagnosis not present

## 2020-03-15 DIAGNOSIS — E119 Type 2 diabetes mellitus without complications: Secondary | ICD-10-CM | POA: Diagnosis not present

## 2020-03-15 DIAGNOSIS — M1612 Unilateral primary osteoarthritis, left hip: Secondary | ICD-10-CM | POA: Diagnosis not present

## 2020-03-15 DIAGNOSIS — D472 Monoclonal gammopathy: Secondary | ICD-10-CM | POA: Diagnosis not present

## 2020-03-15 DIAGNOSIS — E785 Hyperlipidemia, unspecified: Secondary | ICD-10-CM | POA: Diagnosis not present

## 2020-03-15 DIAGNOSIS — Z7952 Long term (current) use of systemic steroids: Secondary | ICD-10-CM | POA: Diagnosis not present

## 2020-03-15 DIAGNOSIS — Z794 Long term (current) use of insulin: Secondary | ICD-10-CM | POA: Diagnosis not present

## 2020-03-15 DIAGNOSIS — G4733 Obstructive sleep apnea (adult) (pediatric): Secondary | ICD-10-CM | POA: Diagnosis not present

## 2020-03-15 DIAGNOSIS — D509 Iron deficiency anemia, unspecified: Secondary | ICD-10-CM | POA: Diagnosis not present

## 2020-03-15 DIAGNOSIS — E079 Disorder of thyroid, unspecified: Secondary | ICD-10-CM | POA: Diagnosis not present

## 2020-03-15 DIAGNOSIS — D508 Other iron deficiency anemias: Secondary | ICD-10-CM

## 2020-03-15 MED ORDER — SODIUM CHLORIDE 0.9 % IV SOLN: INTRAVENOUS | Status: DC

## 2020-03-15 MED ORDER — SODIUM CHLORIDE 0.9 % IV SOLN
510.0000 mg | Freq: Once | INTRAVENOUS | Status: AC
  Administered 2020-03-15: 510 mg via INTRAVENOUS
  Filled 2020-03-15: qty 510

## 2020-03-15 NOTE — Progress Notes (Signed)
Iron given per orders. Patient tolerated it well without problems. Vitals stable and discharged home from clinic via wheelchair. Follow up as scheduled.  

## 2020-03-15 NOTE — Patient Instructions (Signed)
[facility] at [facility]  Discharge Instructions:   _______________________________________________________________  Thank you for choosing [facility] at [facility] to provide your oncology and hematology care.  To afford each patient quality time with our providers, please arrive at least 15 minutes before your scheduled appointment.  You need to re-schedule your appointment if you arrive 10 or more minutes late.  We strive to give you quality time with our providers, and arriving late affects you and other patients whose appointments are after yours.  Also, if you no show three or more times for appointments you may be dismissed from the clinic.  Again, thank you for choosing [facility] at [facility]. Our hope is that these requests will allow you access to exceptional care and in a timely manner. _______________________________________________________________  If you have questions after your visit, please contact our office at [phone] between the hours of 8:30 a.m. and 5:00 p.m. Voicemails left after 4:30 p.m. will not be returned until the following business day. _______________________________________________________________  For prescription refill requests, have your pharmacy contact our office. _______________________________________________________________  Recommendations made by the consultant and any test results will be sent to your referring physician. _______________________________________________________________ 

## 2020-03-17 ENCOUNTER — Ambulatory Visit: Payer: Medicare Other | Admitting: Cardiology

## 2020-03-17 ENCOUNTER — Telehealth: Payer: Self-pay

## 2020-03-17 ENCOUNTER — Encounter: Payer: Self-pay | Admitting: Cardiology

## 2020-03-17 VITALS — BP 153/65 | HR 70 | Temp 97.0°F | Ht 66.0 in | Wt 265.0 lb

## 2020-03-17 DIAGNOSIS — R0789 Other chest pain: Secondary | ICD-10-CM

## 2020-03-17 DIAGNOSIS — I10 Essential (primary) hypertension: Secondary | ICD-10-CM

## 2020-03-17 DIAGNOSIS — R0602 Shortness of breath: Secondary | ICD-10-CM

## 2020-03-17 NOTE — Telephone Encounter (Signed)

## 2020-03-17 NOTE — Progress Notes (Signed)
Clinical Summary [name] is a 79 y.o.female seen today as a new consult, referred by Dr Moshe Cipro for SOB and fatigue.   1. SOB - ongoing x 6 months - no LE edema. SLight cough, occasional wheezing - sleeps in recliner x 8 years for orthopnea   2. Chest pain - symptoms started 2.5 months - can occur at rest or with exertion - dull pain midchest, 5/10 in severity. Lasts few minutes. Occurs daily - can be affected by eating. She is taking protonix.  - can be positional.    3. HTN - compliant with meds - she reports issues with white coat HTN.    4. Hyperlipidemia - compliant with meds, followed by pcp    5. Fe deficient anemia  6. MGUS  Past Medical History:  Diagnosis Date  . Anemia in chronic renal disease 12/30/2015  . Anxiety   . Arthritis    "all over" (01/19/2014)  . Arthritis, lumbar spine   . Carpal tunnel syndrome   . Chronic bronchitis ([place])    "got it q yr for awhile; hasn't had it in awhile" (01/19/2014)  . Chronic kidney disease (CKD) stage G3b/A1, moderately decreased glomerular filtration rate (GFR) between 30-44 mL/min/1.73 square meter and albuminuria creatinine ratio less than 30 mg/g 09/14/2009   Qualifier: Diagnosis of  By: Moshe Cipro MD, Joycelyn Schmid    . Chronic renal disease, stage 3, moderately decreased glomerular filtration rate (GFR) between 30-59 mL/min/1.73 square meter 09/14/2009   Qualifier: Diagnosis of  By: Moshe Cipro MD, Joycelyn Schmid    . Complication of anesthesia    combative  . COPD (chronic obstructive pulmonary disease) ([place])   . Depression   . Diverticulosis 07/2004   Colonscopy Dr Gala Romney  . Esophagitis, erosive 2009  . Exertional shortness of breath   . Gastroesophageal reflux   . [id number]    "usually a couple times/wk" (01/19/2014)  . Heart murmur    saw cardiology In [place], he told her she did not need to come back.  . Hyperlipidemia   . Hypertension   . Impingement syndrome, shoulder   . Iron deficiency anemia     . Mitral regurgitation   . Myasthenia gravis   . Myasthenia gravis in remission ([place]) 11/24/2014  . Myasthenia gravis in remission ([place])   . Obesity   . OSA on CPAP    Negative on last sleep study  . Pneumonia 01/2012  . Pulmonary HTN ([place])   . Schatzki's ring    Last EGD w/ dilation 02/08/11, 2009 & 2007  . Seasonal allergies   . Shoulder pain   . Thyroid disease    "used to take RX; they took me off it" (01/19/2014)  . Tobacco abuse   . Type II diabetes mellitus (H[facility])      No Known Allergies   Current Outpatient Medications  Medication Sig Dispense Refill  . ACETAMINOPHEN EXTRA STRENGTH 500 MG tablet TAKE (1) TABLET BY MOUTH AT BEDTIME. (Patient not taking: Reported on 03/04/2020) 30 tablet 3  . amLODipine (NORVASC) 10 MG tablet TAKE 1 TABLET BY MOUTH ONCE A DAY. 30 tablet 3  . B-D ULTRAFINE III SHORT PEN 31G X 8 MM MISC USE ONCE DAILY WITH LANTUS SOLOSTAR PEN. 100 each 0  . brimonidine-timolol (COMBIGAN) 0.2-0.5 % ophthalmic solution Place 1 drop into both eyes every 12 (twelve) hours.    . cetirizine (ZYRTEC) 10 MG tablet TAKE 1 TABLET BY MOUTH ONCE A DAY. 30 tablet 3  . Cholecalciferol (VITAMIN D3)  2000 units TABS Take 2,000 Units by mouth at bedtime.    . citalopram (CELEXA) 10 MG tablet Take 1 tablet (10 mg total) by mouth daily. 30 tablet 0  . Cod Liver Oil CAPS Take by mouth. 500 MG per patient    . Cranberry 1000 MG CAPS Take 1,000 mg by mouth daily.    . cycloSPORINE (RESTASIS) 0.05 % ophthalmic emulsion Place 1 drop into both eyes 2 (two) times daily.     Marland Kitchen diltiazem (CARDIZEM CD) 240 MG 24 hr capsule TAKE (1) CAPSULE BY MOUTH TWICE DAILY. 60 capsule 3  . ezetimibe (ZETIA) 10 MG tablet TAKE 1 TABLET BY MOUTH ONCE A DAY. 30 tablet 3  . fluticasone (FLONASE) 50 MCG/ACT nasal spray PLACE 2 SPRAYS INTO BOTH NOSTRILS DAILY. (Patient not taking: Reported on 03/04/2020) 16 g 0  . gabapentin (NEURONTIN) 300 MG capsule TAKE (1) CAPSULE BY MOUTH TWICE DAILY. 60 capsule 3  .  hydrALAZINE (APRESOLINE) 50 MG tablet TAKE 1 TABLET BY MOUTH THREE TIMES A DAY. 90 tablet 3  . HYDROcodone-acetaminophen (NORCO) 10-325 MG tablet Take 1 tablet by mouth 2 (two) times daily.    . Lancets (ONETOUCH DELICA PLUS 123XX123) MISC USE AS DIRECTED 3 TIMES DAILY FOR BLOOD GLUCOSE TESTING. 100 each 0  . LANTUS SOLOSTAR 100 UNIT/ML Solostar Pen INJECT 30 UNITS SUBCUTANEOUSLY ONCE DAILY. 15 mL 5  . linaclotide (LINZESS) 72 MCG capsule Take 1 capsule (72 mcg total) by mouth daily before breakfast. 90 capsule 3  . losartan (COZAAR) 100 MG tablet TAKE (1) TABLET BY MOUTH DAILY. 30 tablet 3  . Multiple Vitamin (MULTIVITAMIN WITH MINERALS) TABS tablet Take 1 tablet by mouth at bedtime.    . ondansetron (ZOFRAN-ODT) 8 MG disintegrating tablet Take 1 tablet (8 mg total) by mouth every 8 (eight) hours as needed for nausea or vomiting. (Patient not taking: Reported on 03/04/2020) 30 tablet 0  . ONETOUCH VERIO test strip USE AS DIRECTED TO TEST BLOOD GLUCOSE 3 TIMES DAILY. 100 strip 0  . pantoprazole (PROTONIX) 40 MG tablet TAKE 1 TABLET BY MOUTH ONCE A DAY, 30 MINUTES BEFORE BREAKFAST. 30 tablet 11  . predniSONE (DELTASONE) 5 MG tablet Take 1 tablet (5 mg total) by mouth daily. 30 tablet 0  . PROAIR HFA 108 (90 Base) MCG/ACT inhaler INHALE 2 PUFFS BY MOUTH EVERY 6 HOURS AS NEEDED . (Patient not taking: Reported on 03/04/2020) 8.5 g 5  . pyridostigmine (MESTINON) 60 MG tablet TAKE (1) TABLET BY MOUTH THREE TIMES A DAY. 100 tablet 3  . rosuvastatin (CRESTOR) 40 MG tablet TAKE (1) TABLET BY MOUTH AT BEDTIME. 30 tablet 3  . solifenacin (VESICARE) 10 MG tablet TAKE 1 TABLET BY MOUTH ONCE A DAY. 30 tablet 3  . spironolactone (ALDACTONE) 25 MG tablet TAKE 1 TABLET BY MOUTH ONCE A DAY. 30 tablet 3  . TRADJENTA 5 MG TABS tablet TAKE 1 TABLET BY MOUTH ONCE A DAY. 30 tablet 3  . UNABLE TO FIND Diabetic shoes x 1 Inserts x 3 DX E11.9 1 each 0  . venlafaxine XR (EFFEXOR-XR) 150 MG 24 hr capsule TAKE 1 CAPSULE BY MOUTH  DAILY. 30 capsule 3   No current facility-administered medications for this visit.     Past Surgical History:  Procedure Laterality Date  . [place] VITRECTOMY WITH 20 GAUGE MVR PORT Left 01/19/2014   Procedure: 25 GAUGE PARS PLANA VITRECTOMY WITH 20 GAUGE MVR PORT; MEMBRAME PEEL; SERUM PATCH; LASER TREATMENT; C3F8;  Surgeon: Chrystie Nose  Zigmund Daniel, MD;  Location: [place];  Service: Ophthalmology;  Laterality: Left;  . ABDOMINAL HYSTERECTOMY    . AGILE CAPSULE N/A 08/05/2019   Procedure: AGILE CAPSULE;  Surgeon: Daneil Dolin, MD;  [facility];  Service: Endoscopy;  Laterality: N/A;  7:30am  . APPENDECTOMY    . CARPAL TUNNEL RELEASE Bilateral   . CATARACT EXTRACTION W/PHACO Left 10/21/2013   Procedure: LEFT CATARACT EXTRACTION PHACO AND INTRAOCULAR LENS PLACEMENT (IOC);  Surgeon: Marylynn Pearson, MD;  Location: [place];  Service: Ophthalmology;  Laterality: Left;  . CHOLECYSTECTOMY    . COLONOSCOPY  05/08/2012   [id number] and external hemorrhoids; colonic diverticulosis  . COLONOSCOPY WITH PROPOFOL N/A 02/26/2019   two simple adenomas, diverticulosis,  and internal hemorrhoids.   . ESOPHAGEAL DILATION     "more than 3 times" (01/19/2014)  . ESOPHAGOGASTRODUODENOSCOPY  02/08/11   [place]-Distal esophageal erosion consistent with mild erosive reflux   esophagitis/ Noncritical Schatzki ring, small hiatal hernia otherwise upper/ gastrointestinal tract appeared unremarkable, status post passage  of a Maloney dilation to biopsy disruption of the ring described  . ESOPHAGOGASTRODUODENOSCOPY  04/2012   2 tandem incomplete distal esophagea rings s/p dilation.   . ESOPHAGOGASTRODUODENOSCOPY N/A 09/16/2014   Dr. Gala Romney: Schatzki ring status post dilation/disruption.  Hiatal hernia.  Marland Kitchen ESOPHAGOGASTRODUODENOSCOPY (EGD) WITH PROPOFOL N/A 12/08/2018   EGD with mild Schatzki ring s/p dilation, small hiatal hernia, otherwise normal  . EYE SURGERY     cataracts, bilateral  . GIVENS CAPSULE STUDY N/A  09/29/2019   Procedure: GIVENS CAPSULE STUDY;  Surgeon: Daneil Dolin, MD;  [facility];  Service: Endoscopy;  Laterality: N/A;  7:30am  . INCONTINENCE SURGERY  08/26/09   [place]  . JOINT REPLACEMENT     right total knee  . MALONEY DILATION N/A 09/16/2014   Procedure: Venia Minks DILATION;  Surgeon: Daneil Dolin, MD;  [facility];  Service: Endoscopy;  Laterality: N/A;  . Venia Minks DILATION N/A 12/08/2018   Procedure: Venia Minks DILATION;  Surgeon: Daneil Dolin, MD;  [facility];  Service: Endoscopy;  Laterality: N/A;  . PARS PLANA VITRECTOMY W/ REPAIR OF MACULAR HOLE Left 01/19/2014  . POLYPECTOMY  02/26/2019   Procedure: POLYPECTOMY;  Surgeon: Danie Binder, MD;  [facility];  Service: Endoscopy;;  ascending colon polyps x2  . SAVORY DILATION N/A 09/16/2014   Procedure: SAVORY DILATION;  Surgeon: Daneil Dolin, MD;  [facility];  Service: Endoscopy;  Laterality: N/A;  . TONSILLECTOMY    . TOTAL KNEE ARTHROPLASTY Right 05/13/07   Dr. Aline Brochure  . TRIGGER FINGER RELEASE Right 06/26/2018   Procedure: RIGHT INDEX FINGER TRIGGER FINGER/A-1 PULLEY RELEASE;  Surgeon: Carole Civil, MD;  Location: [facility];  Service: Orthopedics;  Laterality: Right;     No Known Allergies    Family History  Problem Relation Age of Onset  . Cancer Mother   . Heart disease Mother   . Cancer Father   . Heart disease Father   . Diabetes Sister   . Hypertension Brother   . Heart disease Brother   . Cancer Sister   . Kidney failure Sister   . Diabetes Son   . Hypertension Son   . Hypertension Daughter   . Colon cancer Neg Hx      Social History [name] reports that she quit smoking about 16 years ago. Her smoking use included cigarettes. She has a 12.50 pack-year smoking history. She has never  used smokeless tobacco. [name] reports no history of alcohol use.   Review of Systems CONSTITUTIONAL: +fatigue HEENT: Eyes: No  visual loss, blurred vision, double vision or yellow sclerae.No hearing loss, sneezing, congestion, runny nose or sore throat.  SKIN: No rash or itching.  CARDIOVASCULAR: per hpi RESPIRATORY: per hpi GASTROINTESTINAL: No anorexia, nausea, vomiting or diarrhea. No abdominal pain or blood.  GENITOURINARY: No burning on urination, no polyuria NEUROLOGICAL: No headache, dizziness, syncope, paralysis, ataxia, numbness or tingling in the extremities. No change in bowel or bladder control.  MUSCULOSKELETAL: No muscle, back pain, joint pain or stiffness.  LYMPHATICS: No enlarged nodes. No history of splenectomy.  PSYCHIATRIC: No history of depression or anxiety.  ENDOCRINOLOGIC: No reports of sweating, cold or heat intolerance. No polyuria or polydipsia.  Marland Kitchen   Physical Examination Today's Vitals   03/17/20 1515  BP: (!) 153/65  Pulse: 70  Temp: (!) 97 F (36.1 C)  SpO2: 97%  Weight: 265 lb (120.2 kg)  Height: 5\' 6"  (1.676 m)   Body mass index is 42.77 kg/m.  Gen: resting comfortably, no acute distress HEENT: no scleral icterus, pupils equal round and reactive, no palptable cervical adenopathy,  CV: RRR, 3/6 systolic murmur rusb, no jvd Resp: Clear to auscultation bilaterally GI: abdomen is soft, non-tender, non-distended, normal bowel sounds, no hepatosplenomegaly MSK: extremities are warm, no edema.  Skin: warm, no rash Neuro:  no focal deficits Psych: appropriate affect   Diagnostic Studies 2010 echo Study Conclusions   1. Left ventricle: The cavity size was normal. Mild septal   hypertrophy. Systolic function was vigorous. The estimated   ejection fraction was in the range of 65% to 70%. Wall motion was   normal; there were no regional wall motion abnormalities.  2. Left atrium: The atrium was mildly dilated.  3. Pulmonary arteries: PA peak pressure: 63mm Hg (S).  Impressions:   - Compared to the previous study of 08/06/08, mitral regurgitation has    decreased; currently inadequate TR to estimate RV systolic   pressure.      Assessment and Plan  1. SOB - unclear etiology, we will obtain echo to further evaluate  2. Chest pain - assoicated with meals, not consistent with cardiac etiology - no plans for ischemic testing at this time  3. HTN - elevated in clinic, she reports issues with white coat HTN - she will call with home bp's in next few days  4. Heart murmur - f/u echo      Arnoldo Lenis, M.D

## 2020-03-17 NOTE — Patient Instructions (Addendum)
Medication Instructions:  Your physician recommends that you continue on your current medications as directed. Please refer to the Current Medication list given to you today.   Labwork: NONE  Testing/Procedures: Your physician has requested that you have an echocardiogram. Echocardiography is a painless test that uses sound waves to create images of your heart. It provides your doctor with information about the size and shape of your heart and how well your heart's chambers and valves are working. This procedure takes approximately one hour. There are no restrictions for this procedure.    Follow-Up: Your physician recommends that you schedule a follow-up appointment in: 6 WEEKS    Any Other Special Instructions Will Be Listed Below (If Applicable).  PLEASE CALL IN A FEW DAYS WITH HOME BLOOD PRESSURE READINGS   If you need a refill on your cardiac medications before your next appointment, please call your pharmacy.

## 2020-03-18 ENCOUNTER — Other Ambulatory Visit: Payer: Self-pay | Admitting: Family Medicine

## 2020-03-22 ENCOUNTER — Encounter: Payer: Self-pay | Admitting: Gastroenterology

## 2020-03-22 ENCOUNTER — Other Ambulatory Visit: Payer: Self-pay

## 2020-03-22 ENCOUNTER — Ambulatory Visit (HOSPITAL_COMMUNITY)
Admission: RE | Admit: 2020-03-22 | Discharge: 2020-03-22 | Disposition: A | Discharge status: Home / Self Care | Payer: Medicare Other | Source: Ambulatory Visit | Attending: Cardiology | Admitting: Cardiology

## 2020-03-22 ENCOUNTER — Ambulatory Visit (INDEPENDENT_AMBULATORY_CARE_PROVIDER_SITE_OTHER): Payer: Medicare Other | Admitting: Gastroenterology

## 2020-03-22 VITALS — BP 133/62 | HR 65 | Temp 96.6°F | Ht 66.0 in | Wt 258.4 lb

## 2020-03-22 DIAGNOSIS — R0602 Shortness of breath: Secondary | ICD-10-CM | POA: Diagnosis not present

## 2020-03-22 DIAGNOSIS — K625 Hemorrhage of anus and rectum: Secondary | ICD-10-CM

## 2020-03-22 DIAGNOSIS — D500 Iron deficiency anemia secondary to blood loss (chronic): Secondary | ICD-10-CM

## 2020-03-22 NOTE — Progress Notes (Signed)
Referring Provider: Fayrene Helper, MD Primary Care Physician:  Fayrene Helper, MD  Primary GI: Dr. Gala Romney   Chief Complaint  Patient presents with  . Rectal Bleeding    HPI:   [name] is a 79 y.o. 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 Prior evaluation with EGD in Dec 2019 with mild Schatzki ring s/p dilation, small hiatal hernia, otherwise normal, and colonoscopy Feb 2020 with two simple adenomas, diverticulosis, and hemorrhoids, capsule study most recently with small bowel AVMS and erosions. Followed by Hematology.   Ferritin dropped from 6 months ago and Hgb trending down from 11 range 6 months ago to 8.5 one month ago. Received IV iron recently. Lots of gas. Has bleeding with BMs. Will see on tissue and in water. Sometimes bright red and sometimes looks like mucus. Previously has noted intermittent rectal bleeding but not this much. If picks up something heavy, will see more bleeding. Denies itching/burning. Feels like tissue will prolapse out and has to push back in. Has to wipe frequently to get clean. Sits for a long time on the toilet. Sometimes has fallen asleep on toilet. Linzess 72 mcg has addressed constipation. Sometimes skips it if too strong. Wants to hold off on banding today but interested in this if she is a candidate. Feeling tired at times.   Past Medical History:  Diagnosis Date  . Anemia in chronic renal disease 12/30/2015  . Anxiety   . Arthritis    "all over" (01/19/2014)  . Arthritis, lumbar spine   . Carpal tunnel syndrome   . Chronic bronchitis ([place])    "got it q yr for awhile; hasn't had it in awhile" (01/19/2014)  . Chronic kidney disease (CKD) stage G3b/A1, moderately decreased glomerular filtration rate (GFR) between 30-44 mL/min/1.73 square meter and albuminuria creatinine ratio less than 30 mg/g 09/14/2009   Qualifier: Diagnosis of  By: Moshe Cipro MD, Joycelyn Schmid    . Chronic  renal disease, stage 3, moderately decreased glomerular filtration rate (GFR) between 30-59 mL/min/1.73 square meter 09/14/2009   Qualifier: Diagnosis of  By: Moshe Cipro MD, Joycelyn Schmid    . Complication of anesthesia    combative  . COPD (chronic obstructive pulmonary disease) ([place])   . Depression   . Diverticulosis 07/2004   Colonscopy Dr Gala Romney  . Esophagitis, erosive 2009  . Exertional shortness of breath   . Gastroesophageal reflux   . [id number])    "usually a couple times/wk" (01/19/2014)  . Heart murmur    saw cardiology In [place], he told her she did not need to come back.  . Hyperlipidemia   . Hypertension   . Impingement syndrome, shoulder   . Iron deficiency anemia   . Mitral regurgitation   . Myasthenia gravis   . Myasthenia gravis in remission ([place]) 11/24/2014  . Myasthenia gravis in remission ([place])   . Obesity   . OSA on CPAP    Negative on last sleep study  . Pneumonia 01/2012  . Pulmonary HTN ([place])   . Schatzki's ring    Last EGD w/ dilation 02/08/11, 2009 & 2007  . Seasonal allergies   . Shoulder pain   . Thyroid disease    "used to take RX; they took me off it" (01/19/2014)  . Tobacco abuse   . Type II diabetes mellitus ([place])     Past Surgical History:  Procedure Laterality Date  . [place]  WITH 20 GAUGE MVR PORT Left 01/19/2014   Procedure: 25 GAUGE PARS PLANA VITRECTOMY WITH 20 GAUGE MVR PORT; MEMBRAME PEEL; SERUM PATCH; LASER TREATMENT; C3F8;  Surgeon: Hayden Pedro, MD;  Location: [facility];  Service: Ophthalmology;  Laterality: Left;  . ABDOMINAL HYSTERECTOMY    . AGILE CAPSULE N/A 08/05/2019   Procedure: AGILE CAPSULE;  Surgeon: Daneil Dolin, MD;  [facility];  Service: Endoscopy;  Laterality: N/A;  7:30am  . APPENDECTOMY    . CARPAL TUNNEL RELEASE Bilateral   . CATARACT EXTRACTION W/PHACO Left 10/21/2013   Procedure: LEFT CATARACT EXTRACTION PHACO AND INTRAOCULAR LENS PLACEMENT (IOC);  Surgeon: Marylynn Pearson, MD;   Location: [facility];  Service: Ophthalmology;  Laterality: Left;  . CHOLECYSTECTOMY    . COLONOSCOPY  05/08/2012   [id number] and external hemorrhoids; colonic diverticulosis  . COLONOSCOPY WITH PROPOFOL N/A 02/26/2019   two simple adenomas, diverticulosis,  and internal hemorrhoids.   . ESOPHAGEAL DILATION     "more than 3 times" (01/19/2014)  . ESOPHAGOGASTRODUODENOSCOPY  02/08/11   [place]-Distal esophageal erosion consistent with mild erosive reflux   esophagitis/ Noncritical Schatzki ring, small hiatal hernia otherwise upper/ gastrointestinal tract appeared unremarkable, status post passage  of a Maloney dilation to biopsy disruption of the ring described  . ESOPHAGOGASTRODUODENOSCOPY  04/2012   2 tandem incomplete distal esophagea rings s/p dilation.   . ESOPHAGOGASTRODUODENOSCOPY N/A 09/16/2014   Dr. Gala Romney: Schatzki ring status post dilation/disruption.  Hiatal hernia.  Marland Kitchen ESOPHAGOGASTRODUODENOSCOPY (EGD) WITH PROPOFOL N/A 12/08/2018   EGD with mild Schatzki ring s/p dilation, small hiatal hernia, otherwise normal  . EYE SURGERY     cataracts, bilateral  . GIVENS CAPSULE STUDY N/A 09/29/2019   Procedure: GIVENS CAPSULE STUDY;  Surgeon: Daneil Dolin, MD;  [facility];  Service: Endoscopy;  Laterality: N/A;  7:30am  . INCONTINENCE SURGERY  08/26/09   [place]  . JOINT REPLACEMENT     right total knee  . MALONEY DILATION N/A 09/16/2014   Procedure: Venia Minks DILATION;  Surgeon: Daneil Dolin, MD;  [facility];  Service: Endoscopy;  Laterality: N/A;  . Venia Minks DILATION N/A 12/08/2018   Procedure: Venia Minks DILATION;  Surgeon: Daneil Dolin, MD;  [facility];  Service: Endoscopy;  Laterality: N/A;  . PARS PLANA VITRECTOMY W/ REPAIR OF MACULAR HOLE Left 01/19/2014  . POLYPECTOMY  02/26/2019   Procedure: POLYPECTOMY;  Surgeon: Danie Binder, MD;  [facility];  Service: Endoscopy;;  ascending colon polyps x2  . SAVORY DILATION N/A 09/16/2014    Procedure: SAVORY DILATION;  Surgeon: Daneil Dolin, MD;  [facility];  Service: Endoscopy;  Laterality: N/A;  . TONSILLECTOMY    . TOTAL KNEE ARTHROPLASTY Right 05/13/07   Dr. Aline Brochure  . TRIGGER FINGER RELEASE Right 06/26/2018   Procedure: RIGHT INDEX FINGER TRIGGER FINGER/A-1 PULLEY RELEASE;  Surgeon: Carole Civil, MD;  [facility];  Service: Orthopedics;  Laterality: Right;    Current Outpatient Medications  Medication Sig Dispense Refill  . ACETAMINOPHEN EXTRA STRENGTH 500 MG tablet TAKE (1) TABLET BY MOUTH AT BEDTIME. 30 tablet 0  . amLODipine (NORVASC) 10 MG tablet TAKE 1 TABLET BY MOUTH ONCE A DAY. 30 tablet 0  . B-D ULTRAFINE III SHORT PEN 31G X 8 MM MISC USE ONCE DAILY WITH LANTUS SOLOSTAR PEN. 100 each 0  . brimonidine-timolol (COMBIGAN) 0.2-0.5 % ophthalmic solution Place 1 drop into both eyes every 12 (twelve) hours.    Marland Kitchen  cetirizine (ZYRTEC) 10 MG tablet TAKE 1 TABLET BY MOUTH ONCE A DAY. (Patient taking differently: as needed. ) 30 tablet 0  . Cholecalciferol (VITAMIN D3) 2000 units TABS Take 2,000 Units by mouth at bedtime.    . citalopram (CELEXA) 10 MG tablet TAKE 1 TABLET BY MOUTH ONCE A DAY. 30 tablet 0  . Cod Liver Oil CAPS Take by mouth. 500 MG per patient    . Cranberry 1000 MG CAPS Take 1,000 mg by mouth daily.    . cycloSPORINE (RESTASIS) 0.05 % ophthalmic emulsion Place 1 drop into both eyes 2 (two) times daily.     Marland Kitchen diltiazem (CARDIZEM CD) 240 MG 24 hr capsule TAKE (1) CAPSULE BY MOUTH TWICE DAILY. 60 capsule 0  . ezetimibe (ZETIA) 10 MG tablet TAKE 1 TABLET BY MOUTH ONCE A DAY. 30 tablet 0  . fluticasone (FLONASE) 50 MCG/ACT nasal spray PLACE 2 SPRAYS INTO BOTH NOSTRILS DAILY. 16 g 0  . gabapentin (NEURONTIN) 300 MG capsule TAKE (1) CAPSULE BY MOUTH TWICE DAILY. 60 capsule 0  . hydrALAZINE (APRESOLINE) 50 MG tablet TAKE 1 TABLET BY MOUTH THREE TIMES A DAY. 90 tablet 0  . HYDROcodone-acetaminophen (NORCO) 10-325 MG tablet Take 1 tablet by  mouth 2 (two) times daily.    . Lancets (ONETOUCH DELICA PLUS 123XX123) MISC USE AS DIRECTED 3 TIMES DAILY FOR BLOOD GLUCOSE TESTING. 100 each 0  . LANTUS SOLOSTAR 100 UNIT/ML Solostar Pen INJECT 30 UNITS SUBCUTANEOUSLY ONCE DAILY. 15 mL 5  . linaclotide (LINZESS) 72 MCG capsule Take 1 capsule (72 mcg total) by mouth daily before breakfast. 90 capsule 3  . losartan (COZAAR) 100 MG tablet TAKE (1) TABLET BY MOUTH DAILY. 30 tablet 0  . Multiple Vitamin (MULTIVITAMIN WITH MINERALS) TABS tablet Take 1 tablet by mouth at bedtime.    . ondansetron (ZOFRAN-ODT) 8 MG disintegrating tablet Take 1 tablet (8 mg total) by mouth every 8 (eight) hours as needed for nausea or vomiting. 30 tablet 0  . ONETOUCH VERIO test strip USE AS DIRECTED TO TEST BLOOD GLUCOSE 3 TIMES DAILY. 100 strip 0  . pantoprazole (PROTONIX) 40 MG tablet TAKE 1 TABLET BY MOUTH ONCE A DAY, 30 MINUTES BEFORE BREAKFAST. 30 tablet 11  . predniSONE (DELTASONE) 5 MG tablet Take 1 tablet (5 mg total) by mouth daily. 30 tablet 0  . PROAIR HFA 108 (90 Base) MCG/ACT inhaler INHALE 2 PUFFS BY MOUTH EVERY 6 HOURS AS NEEDED . 8.5 g 5  . pyridostigmine (MESTINON) 60 MG tablet TAKE (1) TABLET BY MOUTH THREE TIMES A DAY. 100 tablet 0  . rosuvastatin (CRESTOR) 40 MG tablet TAKE (1) TABLET BY MOUTH AT BEDTIME. 30 tablet 0  . solifenacin (VESICARE) 10 MG tablet TAKE 1 TABLET BY MOUTH ONCE A DAY. 30 tablet 5  . spironolactone (ALDACTONE) 25 MG tablet TAKE 1 TABLET BY MOUTH ONCE A DAY. 30 tablet 0  . TRADJENTA 5 MG TABS tablet TAKE 1 TABLET BY MOUTH ONCE A DAY. 30 tablet 0  . UNABLE TO FIND Diabetic shoes x 1 Inserts x 3 DX E11.9 1 each 0  . venlafaxine XR (EFFEXOR-XR) 150 MG 24 hr capsule TAKE 1 CAPSULE BY MOUTH DAILY. 30 capsule 0   No current facility-administered medications for this visit.    Allergies as of 03/22/2020  . (No Known Allergies)    Family History  Problem Relation Age of Onset  . Cancer Mother   . Heart disease Mother   .  Cancer Father   .  Heart disease Father   . Diabetes Sister   . Hypertension Brother   . Heart disease Brother   . Cancer Sister   . Kidney failure Sister   . Diabetes Son   . Hypertension Son   . Hypertension Daughter   . Colon cancer Neg Hx     Social History   Socioeconomic History  . Marital status: Single    Spouse name: Not on file  . Number of children: 2  . Years of education: Not on file  . Highest education level: Not on file  Occupational History  . Occupation: disabled    Employer: RETIRED  Tobacco Use  . Smoking status: Former Smoker    Packs/day: 0.50    Years: 25.00    Pack years: 12.50    Types: Cigarettes    Quit date: 01/01/2004    Years since quitting: 16.2  . Smokeless tobacco: Never Used  Substance and Sexual Activity  . Alcohol use: No  . Drug use: No  . Sexual activity: Never    Birth control/protection: Surgical  Other Topics Concern  . Not on file  Social History Narrative   Patient lives at home by herself   Patient drinks one cup of coffee a day   Patient is right handed.   Social Determinants of Health   Financial Resource Strain:   . Difficulty of Paying Living Expenses:   Food Insecurity:   . Worried About Charity fundraiser in the Last Year:   . Arboriculturist in the Last Year:   Transportation Needs:   . Film/video editor (Medical):   Marland Kitchen Lack of Transportation (Non-Medical):   Physical Activity:   . Days of Exercise per Week:   . Minutes of Exercise per Session:   Stress:   . Feeling of Stress :   Social Connections: Moderately Isolated  . Frequency of Communication with Friends and Family: More than three times a week  . Frequency of Social Gatherings with Friends and Family: Never  . Attends Religious Services: Never  . Active Member of Clubs or Organizations: No  . Attends Archivist Meetings: Never  . Marital Status: Never married    Review of Systems: Gen: Denies fever, chills, anorexia. Denies  fatigue, weakness, weight loss.  CV: Denies chest pain, palpitations, syncope, peripheral edema, and claudication. Resp: Denies dyspnea at rest, cough, wheezing, coughing up blood, and pleurisy. GI: see HPI Derm: Denies rash, itching, dry skin Psych: Denies depression, anxiety, memory loss, confusion. No homicidal or suicidal ideation.  Heme: see HPI  Physical Exam: BP 133/62   Pulse 65   Temp (!) 96.6 F (35.9 C) (Temporal)   Ht 5\' 6"  (1.676 m)   Wt 258 lb 6.4 oz (117.2 kg)   BMI 41.71 kg/m  General:   Alert and oriented. No distress noted. Pleasant and cooperative.  Head:  Normocephalic and atraumatic. Eyes:  Conjuctiva clear without scleral icterus. Mouth:  Mask in place Abdomen:  +BS, soft, non-tender and non-distended. No rebound or guarding. No HSM or masses noted. Msk:  Symmetrical without gross deformities. Normal posture. Extremities:  Without edema. Neurologic:  Alert and  oriented x4 Psych:  Alert and cooperative. Normal mood and affect.  ASSESSMENT: VERNITTA COSTE is a 79 y.o. female presenting today with a history of IDA multifactorial in setting of known small bowel AVMs, with EGD in Dec 2019 and colonoscopy most recently in Feb 2020, now with persistent low-volume hematochezia most likely  secondary to know internal hemorrhoids. From her description, likely Grade 3.   We discussed avoidance of prolonged toilet time (sometimes falling asleep on toilet), continue to avoid straining, avoidance of constipation as she is doing, and consideration for banding. She would like to pursue this if appropriate at an upcoming appointment.   Constipation: continue Linzess 72 mcg once daily.  GERD: controlled with Protonix once daily.    PLAN:   Avoidance of prolonged toilet time, hemorrhoid literature provided  Continue Linzess 72 mcg once daily  Protonix once daily  Continue Hematology follow-up  Will review her candidacy for banding and get back with her for upcoming  appt   Annitta Needs, PhD, ANP-BC [facility]

## 2020-03-22 NOTE — Progress Notes (Signed)
*  PRELIMINARY RESULTS* Echocardiogram 2D Echocardiogram has been performed.  [name] 03/22/2020, 12:34 PM

## 2020-03-22 NOTE — Patient Instructions (Addendum)
I believe your hemorrhoids are causing the bleeding. These are internal ones. I will check to see if you are a candidate for banding, and we will get you scheduled if so!  Avoid prolonged toilet time. Make sure to only sit on toilet for 2-3 minutes at a time. Avoid straining as you are doing.  We will see you back in 6 months! Happy [place] Birthday!  I enjoyed seeing you again today! As you know, I value our relationship and want to provide genuine, compassionate, and quality care. I welcome your feedback. If you receive a survey regarding your visit,  I greatly appreciate you taking time to fill this out. See you next time!  Annitta Needs, PhD, ANP-BC [facility]   Hemorrhoids Hemorrhoids are swollen veins in and around the rectum or anus. There are two types of hemorrhoids:  Internal hemorrhoids. These occur in the veins that are just inside the rectum. They may poke through to the outside and become irritated and painful.  External hemorrhoids. These occur in the veins that are outside the anus and can be felt as a painful swelling or hard lump near the anus. Most hemorrhoids do not cause serious problems, and they can be managed with home treatments such as diet and lifestyle changes. If home treatments do not help the symptoms, procedures can be done to shrink or remove the hemorrhoids. What are the causes? This condition is caused by increased pressure in the anal area. This pressure may result from various things, including:  Constipation.  Straining to have a bowel movement.  Diarrhea.  Pregnancy.  Obesity.  Sitting for long periods of time.  Heavy lifting or other activity that causes you to strain.  Anal sex.  Riding a bike for a long period of time. What are the signs or symptoms? Symptoms of this condition include:  Pain.  Anal itching or irritation.  Rectal bleeding.  Leakage of stool (feces).  Anal swelling.  One or more lumps around  the anus. How is this diagnosed? This condition can often be diagnosed through a visual exam. Other exams or tests may also be done, such as:  An exam that involves feeling the rectal area with a gloved hand (digital rectal exam).  An exam of the anal canal that is done using a small tube (anoscope).  A blood test, if you have lost a significant amount of blood.  A test to look inside the colon using a flexible tube with a camera on the end (sigmoidoscopy or colonoscopy). How is this treated? This condition can usually be treated at home. However, various procedures may be done if dietary changes, lifestyle changes, and other home treatments do not help your symptoms. These procedures can help make the hemorrhoids smaller or remove them completely. Some of these procedures involve surgery, and others do not. Common procedures include:  Rubber band ligation. Rubber bands are placed at the base of the hemorrhoids to cut off their blood supply.  Sclerotherapy. Medicine is injected into the hemorrhoids to shrink them.  Infrared coagulation. A type of light energy is used to get rid of the hemorrhoids.  Hemorrhoidectomy surgery. The hemorrhoids are surgically removed, and the veins that supply them are tied off.  Stapled hemorrhoidopexy surgery. The surgeon staples the base of the hemorrhoid to the rectal wall. Follow these instructions at home: Eating and drinking   Eat foods that have a lot of fiber in them, such as whole grains, beans, nuts, fruits, and vegetables.  Ask your health care provider about taking products that have added fiber (fiber supplements).  Reduce the amount of fat in your diet. You can do this by eating low-fat dairy products, eating less red meat, and avoiding processed foods.  Drink enough fluid to keep your urine pale yellow. Managing pain and swelling   Take warm sitz baths for 20 minutes, 3-4 times a day to ease pain and discomfort. You may do this in a  bathtub or using a portable sitz bath that fits over the toilet.  If directed, apply ice to the affected area. Using ice packs between sitz baths may be helpful. ? Put ice in a plastic bag. ? Place a towel between your skin and the bag. ? Leave the ice on for 20 minutes, 2-3 times a day. General instructions  Take over-the-counter and prescription medicines only as told by your health care provider.  Use medicated creams or suppositories as told.  Get regular exercise. Ask your health care provider how much and what kind of exercise is best for you. In general, you should do moderate exercise for at least 30 minutes on most days of the week (150 minutes each week). This can include activities such as walking, biking, or yoga.  Go to the bathroom when you have the urge to have a bowel movement. Do not wait.  Avoid straining to have bowel movements.  Keep the anal area dry and clean. Use wet toilet paper or moist towelettes after a bowel movement.  Do not sit on the toilet for long periods of time. This increases blood pooling and pain.  Keep all follow-up visits as told by your health care provider. This is important. Contact a health care provider if you have:  Increasing pain and swelling that are not controlled by treatment or medicine.  Difficulty having a bowel movement, or you are unable to have a bowel movement.  Pain or inflammation outside the area of the hemorrhoids. Get help right away if you have:  Uncontrolled bleeding from your rectum. Summary  Hemorrhoids are swollen veins in and around the rectum or anus.  Most hemorrhoids can be managed with home treatments such as diet and lifestyle changes.  Taking warm sitz baths can help ease pain and discomfort.  In severe cases, procedures or surgery can be done to shrink or remove the hemorrhoids. This information is not intended to replace advice given to you by your health care provider. Make sure you discuss any  questions you have with your health care provider. Document Revised: 05/15/2019 Document Reviewed: 05/08/2018 Elsevier Patient Education  [place].

## 2020-03-22 NOTE — Progress Notes (Signed)
CC'ED TO PCP 

## 2020-04-01 ENCOUNTER — Other Ambulatory Visit: Payer: Self-pay | Admitting: Family Medicine

## 2020-04-06 ENCOUNTER — Other Ambulatory Visit: Payer: Self-pay | Admitting: Family Medicine

## 2020-04-06 ENCOUNTER — Telehealth: Payer: Self-pay | Admitting: Family Medicine

## 2020-04-06 ENCOUNTER — Telehealth: Payer: Self-pay | Admitting: Gastroenterology

## 2020-04-06 DIAGNOSIS — E785 Hyperlipidemia, unspecified: Secondary | ICD-10-CM

## 2020-04-06 DIAGNOSIS — R7301 Impaired fasting glucose: Secondary | ICD-10-CM

## 2020-04-06 DIAGNOSIS — I10 Essential (primary) hypertension: Secondary | ICD-10-CM

## 2020-04-06 MED ORDER — HYDROCODONE-ACETAMINOPHEN 10-325 MG PO TABS: ORAL_TABLET | ORAL | 0 refills | Status: DC

## 2020-04-06 NOTE — Telephone Encounter (Signed)
Please let patient know that if she is still interested in hemorrhoid banding, we can pursue this. We can arrange a banding appt in near future due to symptomatic hemorrhoids

## 2020-04-06 NOTE — Telephone Encounter (Signed)
Will not let me refuse prescription

## 2020-04-06 NOTE — Telephone Encounter (Signed)
pls contact pt needs UDS , fasting lipid, cmp and EGFR and HBa1C in next 2 to 3 weeks, I have sent in 1 month of pain meds, needs these labs done before any more refills, thanks

## 2020-04-07 ENCOUNTER — Ambulatory Visit: Payer: Medicare Other

## 2020-04-07 ENCOUNTER — Other Ambulatory Visit: Payer: Self-pay

## 2020-04-07 DIAGNOSIS — R52 Pain, unspecified: Secondary | ICD-10-CM

## 2020-04-07 DIAGNOSIS — G8929 Other chronic pain: Secondary | ICD-10-CM

## 2020-04-07 NOTE — Addendum Note (Signed)
Addended by: Eual Fines on: 04/07/2020 08:11 AM   Modules accepted: Orders

## 2020-04-07 NOTE — Progress Notes (Signed)
Left specimen

## 2020-04-07 NOTE — Telephone Encounter (Signed)
Message left for patient

## 2020-04-07 NOTE — Telephone Encounter (Signed)
Called pt and she is interested in hemorrhoid banding.  She scheduled an appt for 04/29/2020 for banding.

## 2020-04-08 ENCOUNTER — Other Ambulatory Visit (HOSPITAL_COMMUNITY)
Admission: RE | Admit: 2020-04-08 | Discharge: 2020-04-08 | Disposition: A | Discharge status: Home / Self Care | Payer: Medicare Other | Source: Other Acute Inpatient Hospital | Attending: Family Medicine | Admitting: Family Medicine

## 2020-04-08 DIAGNOSIS — R7301 Impaired fasting glucose: Secondary | ICD-10-CM | POA: Insufficient documentation

## 2020-04-08 DIAGNOSIS — E785 Hyperlipidemia, unspecified: Secondary | ICD-10-CM | POA: Insufficient documentation

## 2020-04-08 DIAGNOSIS — R52 Pain, unspecified: Secondary | ICD-10-CM | POA: Insufficient documentation

## 2020-04-11 LAB — MISC LABCORP TEST (SEND OUT): Labcorp test code: 738526

## 2020-04-12 ENCOUNTER — Other Ambulatory Visit: Payer: Self-pay

## 2020-04-12 ENCOUNTER — Ambulatory Visit (INDEPENDENT_AMBULATORY_CARE_PROVIDER_SITE_OTHER): Payer: Medicare Other | Admitting: Family Medicine

## 2020-04-12 ENCOUNTER — Encounter: Payer: Self-pay | Admitting: Family Medicine

## 2020-04-12 VITALS — BP 140/71 | HR 76 | Temp 97.8°F | Ht 66.0 in | Wt 247.4 lb

## 2020-04-12 DIAGNOSIS — J0100 Acute maxillary sinusitis, unspecified: Secondary | ICD-10-CM | POA: Diagnosis not present

## 2020-04-12 MED ORDER — AMOXICILLIN 875 MG PO TABS: 875.0000 mg | ORAL_TABLET | Freq: Two times a day (BID) | ORAL | 0 refills | Status: DC

## 2020-04-12 NOTE — Patient Instructions (Addendum)
Take Amoxil twice a day with food Stop zyrtec Mucinex 600mg  12 hours -twice a day with food Use flonase at night-2 sprays to each nostril -rinse the mouth afterward Drink plenty water Nasal saline    If you have lab work done today you will be contacted with your lab results within the next 2 weeks.  If you have not heard from [place] then please contact us. The fastest way to get your results is to register for My Chart.   IF you received an x-ray today, you will receive an invoice from [facility]. Please contact [facility] at [phone] with questions or concerns regarding your invoice.   IF you received labwork today, you will receive an invoice from [facility]. Please contact [facility] at [phone] with questions or concerns regarding your invoice.   Our billing staff will not be able to assist you with questions regarding bills from these companies.  You will be contacted with the lab results as soon as they are available. The fastest way to get your results is to activate your My Chart account. Instructions are located on the last page of this paperwork. If you have not heard from [place] regarding the results in 2 weeks, please contact this office.

## 2020-04-12 NOTE — Progress Notes (Signed)
Established Patient Office Visit  Subjective:  Patient ID: [name], female    DOB: April 11, 1941  Age: 79 y.o. MRN: [id number]  CC:  Chief Complaint  Patient presents with  . Sinusitis    sinus pain and pressure x4 month but worse in the last few weeks. Eyes and face hurt    HPI ASYAH HUGUENIN presents for sinus pressure-concern for sinus infection Pt took flonase and zyrtec-no longer helping symptoms.  No fever, fatigue Pressure bilat Drainage down the back of the throat Past Medical History:  Diagnosis Date  . Anemia in chronic renal disease 12/30/2015  . Anxiety   . Arthritis    "all over" (01/19/2014)  . Arthritis, lumbar spine   . Carpal tunnel syndrome   . Chronic bronchitis ([place])    "got it q yr for awhile; hasn't had it in awhile" (01/19/2014)  . Chronic kidney disease (CKD) stage G3b/A1, moderately decreased glomerular filtration rate (GFR) between 30-44 mL/min/1.73 square meter and albuminuria creatinine ratio less than 30 mg/g 09/14/2009   Qualifier: Diagnosis of  By: Moshe Cipro MD, Joycelyn Schmid    . Chronic renal disease, stage 3, moderately decreased glomerular filtration rate (GFR) between 30-59 mL/min/1.73 square meter 09/14/2009   Qualifier: Diagnosis of  By: Moshe Cipro MD, Joycelyn Schmid    . Complication of anesthesia    combative  . COPD (chronic obstructive pulmonary disease) ([place])   . Depression   . Diverticulosis 07/2004   Colonscopy Dr Gala Romney  . Esophagitis, erosive 2009  . Exertional shortness of breath   . Gastroesophageal reflux   . [id number])    "usually a couple times/wk" (01/19/2014)  . Heart murmur    saw cardiology In [place], he told her she did not need to come back.  . Hyperlipidemia   . Hypertension   . Impingement syndrome, shoulder   . Iron deficiency anemia   . Mitral regurgitation   . Myasthenia gravis   . Myasthenia gravis in remission ([place]) 11/24/2014  . Myasthenia gravis in remission ([place])   . Obesity   . OSA on CPAP    Negative on last sleep study  . Pneumonia 01/2012  . Pulmonary HTN ([place])   . Schatzki's ring    Last EGD w/ dilation 02/08/11, 2009 & 2007  . Seasonal allergies   . Shoulder pain   . Thyroid disease    "used to take RX; they took me off it" (01/19/2014)  . Tobacco abuse   . Type II diabetes mellitus ([place])     Past Surgical History:  Procedure Laterality Date  . [place] VITRECTOMY WITH 20 GAUGE MVR PORT Left 01/19/2014   Procedure: 25 GAUGE PARS PLANA VITRECTOMY WITH 20 GAUGE MVR PORT; MEMBRAME PEEL; SERUM PATCH; LASER TREATMENT; C3F8;  Surgeon: Hayden Pedro, MD;  Location: [facility];  Service: Ophthalmology;  Laterality: Left;  . ABDOMINAL HYSTERECTOMY    . AGILE CAPSULE N/A 08/05/2019   Procedure: AGILE CAPSULE;  Surgeon: Daneil Dolin, MD;  [facility];  Service: Endoscopy;  Laterality: N/A;  7:30am  . APPENDECTOMY    . CARPAL TUNNEL RELEASE Bilateral   . CATARACT EXTRACTION W/PHACO Left 10/21/2013   Procedure: LEFT CATARACT EXTRACTION PHACO AND INTRAOCULAR LENS PLACEMENT (IOC);  Surgeon: Marylynn Pearson, MD;  Location: [facility];  Service: Ophthalmology;  Laterality: Left;  . CHOLECYSTECTOMY    . COLONOSCOPY  05/08/2012   [id number] and external hemorrhoids; colonic diverticulosis  . COLONOSCOPY WITH PROPOFOL N/A 02/26/2019   two  simple adenomas, diverticulosis,  and internal hemorrhoids.   . ESOPHAGEAL DILATION     "more than 3 times" (01/19/2014)  . ESOPHAGOGASTRODUODENOSCOPY  02/08/11   [place]-Distal esophageal erosion consistent with mild erosive reflux   esophagitis/ Noncritical Schatzki ring, small hiatal hernia otherwise upper/ gastrointestinal tract appeared unremarkable, status post passage  of a Maloney dilation to biopsy disruption of the ring described  . ESOPHAGOGASTRODUODENOSCOPY  04/2012   2 tandem incomplete distal esophagea rings s/p dilation.   . ESOPHAGOGASTRODUODENOSCOPY N/A 09/16/2014   Dr. Gala Romney: Schatzki ring status post dilation/disruption.  Hiatal  hernia.  Marland Kitchen ESOPHAGOGASTRODUODENOSCOPY (EGD) WITH PROPOFOL N/A 12/08/2018   EGD with mild Schatzki ring s/p dilation, small hiatal hernia, otherwise normal  . EYE SURGERY     cataracts, bilateral  . GIVENS CAPSULE STUDY N/A 09/29/2019   Procedure: GIVENS CAPSULE STUDY;  Surgeon: Daneil Dolin, MD;  [facility];  Service: Endoscopy;  Laterality: N/A;  7:30am  . INCONTINENCE SURGERY  08/26/09   [place]  . JOINT REPLACEMENT     right total knee  . MALONEY DILATION N/A 09/16/2014   Procedure: Venia Minks DILATION;  Surgeon: Daneil Dolin, MD;  [facility];  Service: Endoscopy;  Laterality: N/A;  . Venia Minks DILATION N/A 12/08/2018   Procedure: Venia Minks DILATION;  Surgeon: Daneil Dolin, MD;  [facility];  Service: Endoscopy;  Laterality: N/A;  . PARS PLANA VITRECTOMY W/ REPAIR OF MACULAR HOLE Left 01/19/2014  . POLYPECTOMY  02/26/2019   Procedure: POLYPECTOMY;  Surgeon: Danie Binder, MD;  [facility];  Service: Endoscopy;;  ascending colon polyps x2  . SAVORY DILATION N/A 09/16/2014   Procedure: SAVORY DILATION;  Surgeon: Daneil Dolin, MD;  [facility];  Service: Endoscopy;  Laterality: N/A;  . TONSILLECTOMY    . TOTAL KNEE ARTHROPLASTY Right 05/13/07   Dr. Aline Brochure  . TRIGGER FINGER RELEASE Right 06/26/2018   Procedure: RIGHT INDEX FINGER TRIGGER FINGER/A-1 PULLEY RELEASE;  Surgeon: Carole Civil, MD;  Location: [facility];  Service: Orthopedics;  Laterality: Right;    Family History  Problem Relation Age of Onset  . Cancer Mother   . Heart disease Mother   . Cancer Father   . Heart disease Father   . Diabetes Sister   . Hypertension Brother   . Heart disease Brother   . Cancer Sister   . Kidney failure Sister   . Diabetes Son   . Hypertension Son   . Hypertension Daughter   . Colon cancer Neg Hx     Social History   Socioeconomic History  . Marital status: Single    Spouse name: Not on file  . Number of children:  2  . Years of education: Not on file  . Highest education level: Not on file  Occupational History  . Occupation: disabled    Employer: RETIRED  Tobacco Use  . Smoking status: Former Smoker    Packs/day: 0.50    Years: 25.00    Pack years: 12.50    Types: Cigarettes    Quit date: 01/01/2004    Years since quitting: 16.2  . Smokeless tobacco: Never Used  Substance and Sexual Activity  . Alcohol use: No  . Drug use: No  . Sexual activity: Never    Birth control/protection: Surgical  Other Topics Concern  . Not on file  Social History Narrative   Patient lives at home by herself   Patient drinks one cup of coffee  a day   Patient is right handed.   Social Determinants of Health   Financial Resource Strain:   . Difficulty of Paying Living Expenses:   Food Insecurity:   . Worried About Charity fundraiser in the Last Year:   . Arboriculturist in the Last Year:   Transportation Needs:   . Film/video editor (Medical):   Marland Kitchen Lack of Transportation (Non-Medical):   Physical Activity:   . Days of Exercise per Week:   . Minutes of Exercise per Session:   Stress:   . Feeling of Stress :   Social Connections: Moderately Isolated  . Frequency of Communication with Friends and Family: More than three times a week  . Frequency of Social Gatherings with Friends and Family: Never  . Attends Religious Services: Never  . Active Member of Clubs or Organizations: No  . Attends Archivist Meetings: Never  . Marital Status: Never married  Intimate Partner Violence: Not At Risk  . Fear of Current or Ex-Partner: No  . Emotionally Abused: No  . Physically Abused: No  . Sexually Abused: No    Outpatient Medications Prior to Visit  Medication Sig Dispense Refill  . ACETAMINOPHEN EXTRA STRENGTH 500 MG tablet TAKE (1) TABLET BY MOUTH AT BEDTIME. 30 tablet 0  . amLODipine (NORVASC) 10 MG tablet TAKE 1 TABLET BY MOUTH ONCE A DAY. 30 tablet 0  . B-D ULTRAFINE III SHORT PEN 31G X 8  MM MISC USE ONCE DAILY WITH LANTUS SOLOSTAR PEN. 100 each 0  . brimonidine-timolol (COMBIGAN) 0.2-0.5 % ophthalmic solution Place 1 drop into both eyes every 12 (twelve) hours.    . cetirizine (ZYRTEC) 10 MG tablet TAKE 1 TABLET BY MOUTH ONCE A DAY. (Patient taking differently: as needed. ) 30 tablet 0  . Cholecalciferol (VITAMIN D3) 2000 units TABS Take 2,000 Units by mouth at bedtime.    . citalopram (CELEXA) 10 MG tablet TAKE 1 TABLET BY MOUTH ONCE A DAY. 30 tablet 0  . Cod Liver Oil CAPS Take by mouth. 500 MG per patient    . Cranberry 1000 MG CAPS Take 1,000 mg by mouth daily.    . cycloSPORINE (RESTASIS) 0.05 % ophthalmic emulsion Place 1 drop into both eyes 2 (two) times daily.     Marland Kitchen diltiazem (CARDIZEM CD) 240 MG 24 hr capsule TAKE (1) CAPSULE BY MOUTH TWICE DAILY. 60 capsule 0  . ezetimibe (ZETIA) 10 MG tablet TAKE 1 TABLET BY MOUTH ONCE A DAY. 30 tablet 0  . fluticasone (FLONASE) 50 MCG/ACT nasal spray PLACE 2 SPRAYS INTO BOTH NOSTRILS DAILY. 16 g 0  . gabapentin (NEURONTIN) 300 MG capsule TAKE (1) CAPSULE BY MOUTH TWICE DAILY. 60 capsule 0  . hydrALAZINE (APRESOLINE) 50 MG tablet TAKE 1 TABLET BY MOUTH THREE TIMES A DAY. 90 tablet 0  . HYDROcodone-acetaminophen (NORCO) 10-325 MG tablet Take 1 tablet by mouth 2 (two) times daily.    Marland Kitchen HYDROcodone-acetaminophen (NORCO) 10-325 MG tablet Take one tablet by mouth two times daily for chronic pain 60 tablet 0  . Lancets (ONETOUCH DELICA PLUS 123XX123) MISC USE AS DIRECTED 3 TIMES DAILY FOR BLOOD GLUCOSE TESTING. 100 each 0  . LANTUS SOLOSTAR 100 UNIT/ML Solostar Pen INJECT 30 UNITS SUBCUTANEOUSLY ONCE DAILY. 15 mL 5  . linaclotide (LINZESS) 72 MCG capsule Take 1 capsule (72 mcg total) by mouth daily before breakfast. 90 capsule 3  . losartan (COZAAR) 100 MG tablet TAKE (1) TABLET BY MOUTH DAILY. 30 tablet  0  . Multiple Vitamin (MULTIVITAMIN WITH MINERALS) TABS tablet Take 1 tablet by mouth at bedtime.    . ondansetron (ZOFRAN-ODT) 8 MG  disintegrating tablet Take 1 tablet (8 mg total) by mouth every 8 (eight) hours as needed for nausea or vomiting. 30 tablet 0  . ONETOUCH VERIO test strip USE AS DIRECTED TO TEST BLOOD GLUCOSE 3 TIMES DAILY. 100 strip 0  . pantoprazole (PROTONIX) 40 MG tablet TAKE 1 TABLET BY MOUTH ONCE A DAY, 30 MINUTES BEFORE BREAKFAST. 30 tablet 11  . predniSONE (DELTASONE) 5 MG tablet Take 1 tablet (5 mg total) by mouth daily. 30 tablet 0  . PROAIR HFA 108 (90 Base) MCG/ACT inhaler INHALE 2 PUFFS BY MOUTH EVERY 6 HOURS AS NEEDED . 8.5 g 5  . pyridostigmine (MESTINON) 60 MG tablet TAKE (1) TABLET BY MOUTH THREE TIMES A DAY. 100 tablet 0  . rosuvastatin (CRESTOR) 40 MG tablet TAKE (1) TABLET BY MOUTH AT BEDTIME. 30 tablet 0  . solifenacin (VESICARE) 10 MG tablet TAKE 1 TABLET BY MOUTH ONCE A DAY. 30 tablet 5  . spironolactone (ALDACTONE) 25 MG tablet TAKE 1 TABLET BY MOUTH ONCE A DAY. 30 tablet 0  . TRADJENTA 5 MG TABS tablet TAKE 1 TABLET BY MOUTH ONCE A DAY. 30 tablet 0  . UNABLE TO FIND Diabetic shoes x 1 Inserts x 3 DX E11.9 1 each 0  . venlafaxine XR (EFFEXOR-XR) 150 MG 24 hr capsule TAKE 1 CAPSULE BY MOUTH DAILY. 30 capsule 0   No facility-administered medications prior to visit.    No Known Allergies  ROS Review of Systems  Constitutional: Positive for fatigue. Negative for fever.  HENT: Positive for congestion, hearing loss, sinus pressure and sinus pain. Negative for ear discharge, ear pain, sore throat and trouble swallowing.   Respiratory: Positive for cough. Negative for shortness of breath.   Gastrointestinal: Positive for nausea and vomiting.       GERD-esophageal dilation      Objective:    Physical Exam  Constitutional: She is oriented to person, place, and time. She appears well-developed and well-nourished.  HENT:  Head: Normocephalic and atraumatic.    Right Ear: External ear normal.  Left Ear: External ear normal.  Nose: Nose normal.  Mouth/Throat: Oropharynx is clear and  moist. No oropharyngeal exudate.  Sinus pressure tenderness to palpation bilat  Cardiovascular: Normal rate and regular rhythm.  Neurological: She is oriented to person, place, and time.  Psychiatric: She has a normal mood and affect. Her behavior is normal.    BP 140/71 (BP Location: Left Arm, Patient Position: Sitting, Cuff Size: Large)   Pulse 76   Temp 97.8 F (36.6 C) (Temporal)   Ht 5\' 6"  (1.676 m)   Wt 247 lb 6.4 oz (112.2 kg)   SpO2 95%   BMI 39.93 kg/m  Wt Readings from Last 3 Encounters:  04/12/20 247 lb 6.4 oz (112.2 kg)  03/22/20 258 lb 6.4 oz (117.2 kg)  03/17/20 265 lb (120.2 kg)     Health Maintenance Due  Topic Date Due  . TETANUS/TDAP  07/12/2014    Lab Results  Component Value Date   TSH 0.680 09/24/2019   Lab Results  Component Value Date   WBC 9.3 02/12/2020   HGB 8.5 (L) 02/12/2020   HCT 29.3 (L) 02/12/2020   MCV 93.6 02/12/2020   PLT 311 02/12/2020   Lab Results  Component Value Date   NA 141 02/12/2020   K 3.4 (L) 02/12/2020  CO2 25 02/12/2020   GLUCOSE 185 (H) 02/12/2020   BUN 18 02/12/2020   CREATININE 0.96 02/12/2020   BILITOT 0.7 02/12/2020   ALKPHOS 124 02/12/2020   AST 16 02/12/2020   ALT 15 02/12/2020   PROT 6.9 02/12/2020   ALBUMIN 3.2 (L) 02/12/2020   CALCIUM 8.6 (L) 02/12/2020   ANIONGAP 8 02/12/2020   Lab Results  Component Value Date   CHOL 173 02/10/2020   Lab Results  Component Value Date   HDL 45 (L) 02/10/2020   Lab Results  Component Value Date   LDLCALC 109 (H) 02/10/2020   Lab Results  Component Value Date   TRIG 94 02/10/2020   Lab Results  Component Value Date   CHOLHDL 3.8 02/10/2020   Lab Results  Component Value Date   HGBA1C 7.3 (H) 02/10/2020      Assessment & Plan:  1. Acute maxillary sinusitis, recurrence not specified Amoxil-rx mucinex flonase Hold zyrtec  Follow-up: as needed   Kristopher Delk Hannah Beat, MD

## 2020-04-14 ENCOUNTER — Other Ambulatory Visit: Payer: Self-pay | Admitting: Family Medicine

## 2020-04-20 ENCOUNTER — Other Ambulatory Visit: Payer: Self-pay | Admitting: Family Medicine

## 2020-04-20 DIAGNOSIS — J0100 Acute maxillary sinusitis, unspecified: Secondary | ICD-10-CM

## 2020-04-22 ENCOUNTER — Other Ambulatory Visit: Payer: Self-pay | Admitting: Family Medicine

## 2020-04-25 ENCOUNTER — Encounter: Payer: Self-pay | Admitting: Cardiology

## 2020-04-25 ENCOUNTER — Telehealth: Payer: Self-pay

## 2020-04-25 ENCOUNTER — Telehealth (INDEPENDENT_AMBULATORY_CARE_PROVIDER_SITE_OTHER): Payer: Medicare Other | Admitting: Cardiology

## 2020-04-25 VITALS — Ht 66.0 in | Wt 240.0 lb

## 2020-04-25 DIAGNOSIS — R0602 Shortness of breath: Secondary | ICD-10-CM

## 2020-04-25 DIAGNOSIS — R0789 Other chest pain: Secondary | ICD-10-CM | POA: Diagnosis not present

## 2020-04-25 NOTE — Telephone Encounter (Signed)
Pt is calling states that she feels bad, with fever and chills.  I advised for her to go to the Urgent Care on [address]

## 2020-04-25 NOTE — Patient Instructions (Signed)
Medication Instructions: Your physician recommends that you continue on your current medications as directed. Please refer to the Current Medication list given to you today.   Labwork: Cbc   Procedures/Testing: None today  Follow-Up: 3 months office or virtual visit with Dr.Branch  Any Additional Special Instructions Will Be Listed Below (If Applicable).     If you need a refill on your cardiac medications before your next appointment, please call your pharmacy.

## 2020-04-25 NOTE — Progress Notes (Signed)
Virtual Visit via Telephone Note   This visit type was conducted due to national recommendations for restrictions regarding the COVID-19 Pandemic (e.g. social distancing) in an effort to limit this patient's exposure and mitigate transmission in our community.  Due to her co-morbid illnesses, this patient is at least at moderate risk for complications without adequate follow up.  This format is felt to be most appropriate for this patient at this time.  The patient did not have access to video technology/had technical difficulties with video requiring transitioning to audio format only (telephone).  All issues noted in this document were discussed and addressed.  No physical exam could be performed with this format.  Please refer to the patient's chart for her  consent to telehealth for [facility].   The patient was identified using 2 identifiers.  Date:  04/25/2020   ID:  [name], DOB 1941/03/10, MRN [id number]  Patient Location: Home Provider Location: Office  PCP:  Fayrene Helper, MD  Cardiologist:  Dr Harl Bowie Electrophysiologist:  None   Evaluation Performed:  Follow-Up Visit  Chief Complaint:  Follow up visit  History of Present Illness:    [name] is a 79 y.o. female with seen today for follow up of the following medical problems.    1. SOB - ongoing x 6 months - no LE edema. SLight cough, occasional wheezing - sleeps in recliner x 8 years for orthopnea   02/2020 echo LVEF 70-75%, grade I DDx, mod LAE,  - does have smoking history - Hgb was 8.5 by 02/2020 labs down from 10.3 in 11/2019 - ongoing sinus infection causing some symptoms.  - 2010 PFTs mild obstruction, mild restriction, severe diffusion decrease   2. Chest pain - symptoms started 2.5 months - can occur at rest or with exertion - dull pain midchest, 5/10 in severity. Lasts few minutes. Occurs daily - can be affected by eating. She is taking protonix.  - can be positional.   -  no recent symptoms   3. HTN - she reports issues with white coat HTN.  - compliant with meds   4. Hyperlipidemia - compliant with meds, followed by pcp    5. Fe deficient anemia  6. MGUS  The patient does not have symptoms concerning for COVID-19 infection (fever, chills, cough, or new shortness of breath).    Past Medical History:  Diagnosis Date  . Anemia in chronic renal disease 12/30/2015  . Anxiety   . Arthritis    "all over" (01/19/2014)  . Arthritis, lumbar spine   . Carpal tunnel syndrome   . Chronic bronchitis ([place])    "got it q yr for awhile; hasn't had it in awhile" (01/19/2014)  . Chronic kidney disease (CKD) stage G3b/A1, moderately decreased glomerular filtration rate (GFR) between 30-44 mL/min/1.73 square meter and albuminuria creatinine ratio less than 30 mg/g 09/14/2009   Qualifier: Diagnosis of  By: Moshe Cipro MD, Joycelyn Schmid    . Chronic renal disease, stage 3, moderately decreased glomerular filtration rate (GFR) between 30-59 mL/min/1.73 square meter 09/14/2009   Qualifier: Diagnosis of  By: Moshe Cipro MD, Joycelyn Schmid    . Complication of anesthesia    combative  . COPD (chronic obstructive pulmonary disease) ([place])   . Depression   . Diverticulosis 07/2004   Colonscopy Dr Gala Romney  . Esophagitis, erosive 2009  . Exertional shortness of breath   . Gastroesophageal reflux   . [id number])    "usually a couple times/wk" (01/19/2014)  . Heart murmur  saw cardiology In [place], he told her she did not need to come back.  . Hyperlipidemia   . Hypertension   . Impingement syndrome, shoulder   . Iron deficiency anemia   . Mitral regurgitation   . Myasthenia gravis   . Myasthenia gravis in remission ([place]) 11/24/2014  . Myasthenia gravis in remission ([place])   . Obesity   . OSA on CPAP    Negative on last sleep study  . Pneumonia 01/2012  . Pulmonary HTN ([place])   . Schatzki's ring    Last EGD w/ dilation 02/08/11, 2009 & 2007  . Seasonal allergies   .  Shoulder pain   . Thyroid disease    "used to take RX; they took me off it" (01/19/2014)  . Tobacco abuse   . Type II diabetes mellitus ([place])    Past Surgical History:  Procedure Laterality Date  . [place] VITRECTOMY WITH 20 GAUGE MVR PORT Left 01/19/2014   Procedure: 25 GAUGE PARS PLANA VITRECTOMY WITH 20 GAUGE MVR PORT; MEMBRAME PEEL; SERUM PATCH; LASER TREATMENT; C3F8;  Surgeon: Hayden Pedro, MD;  Location: [facility];  Service: Ophthalmology;  Laterality: Left;  . ABDOMINAL HYSTERECTOMY    . AGILE CAPSULE N/A 08/05/2019   Procedure: AGILE CAPSULE;  Surgeon: Daneil Dolin, MD;  [facility];  Service: Endoscopy;  Laterality: N/A;  7:30am  . APPENDECTOMY    . CARPAL TUNNEL RELEASE Bilateral   . CATARACT EXTRACTION W/PHACO Left 10/21/2013   Procedure: LEFT CATARACT EXTRACTION PHACO AND INTRAOCULAR LENS PLACEMENT (IOC);  Surgeon: Marylynn Pearson, MD;  Location: [place];  Service: Ophthalmology;  Laterality: Left;  . CHOLECYSTECTOMY    . COLONOSCOPY  05/08/2012   [id number] and external hemorrhoids; colonic diverticulosis  . COLONOSCOPY WITH PROPOFOL N/A 02/26/2019   two simple adenomas, diverticulosis,  and internal hemorrhoids.   . ESOPHAGEAL DILATION     "more than 3 times" (01/19/2014)  . ESOPHAGOGASTRODUODENOSCOPY  02/08/11   [place]-Distal esophageal erosion consistent with mild erosive reflux   esophagitis/ Noncritical Schatzki ring, small hiatal hernia otherwise upper/ gastrointestinal tract appeared unremarkable, status post passage  of a Maloney dilation to biopsy disruption of the ring described  . ESOPHAGOGASTRODUODENOSCOPY  04/2012   2 tandem incomplete distal esophagea rings s/p dilation.   . ESOPHAGOGASTRODUODENOSCOPY N/A 09/16/2014   Dr. Gala Romney: Schatzki ring status post dilation/disruption.  Hiatal hernia.  Marland Kitchen ESOPHAGOGASTRODUODENOSCOPY (EGD) WITH PROPOFOL N/A 12/08/2018   EGD with mild Schatzki ring s/p dilation, small hiatal hernia, otherwise normal  . EYE SURGERY      cataracts, bilateral  . GIVENS CAPSULE STUDY N/A 09/29/2019   Procedure: GIVENS CAPSULE STUDY;  Surgeon: Daneil Dolin, MD;  [facility];  Service: Endoscopy;  Laterality: N/A;  7:30am  . INCONTINENCE SURGERY  08/26/09   [place]  . JOINT REPLACEMENT     right total knee  . MALONEY DILATION N/A 09/16/2014   Procedure: Venia Minks DILATION;  Surgeon: Daneil Dolin, MD;  [facility];  Service: Endoscopy;  Laterality: N/A;  . Venia Minks DILATION N/A 12/08/2018   Procedure: Venia Minks DILATION;  Surgeon: Daneil Dolin, MD;  [facility];  Service: Endoscopy;  Laterality: N/A;  . PARS PLANA VITRECTOMY W/ REPAIR OF MACULAR HOLE Left 01/19/2014  . POLYPECTOMY  02/26/2019   Procedure: POLYPECTOMY;  Surgeon: Danie Binder, MD;  [facility];  Service: Endoscopy;;  ascending colon polyps x2  . SAVORY DILATION N/A 09/16/2014  Procedure: SAVORY DILATION;  Surgeon: Daneil Dolin, MD;  [facility];  Service: Endoscopy;  Laterality: N/A;  . TONSILLECTOMY    . TOTAL KNEE ARTHROPLASTY Right 05/13/07   Dr. Aline Brochure  . TRIGGER FINGER RELEASE Right 06/26/2018   Procedure: RIGHT INDEX FINGER TRIGGER FINGER/A-1 PULLEY RELEASE;  Surgeon: Carole Civil, MD;  Location: [facility];  Service: Orthopedics;  Laterality: Right;     Current Meds  Medication Sig  . ACETAMINOPHEN EXTRA STRENGTH 500 MG tablet TAKE (1) TABLET BY MOUTH AT BEDTIME.  Marland Kitchen amLODipine (NORVASC) 10 MG tablet TAKE 1 TABLET BY MOUTH ONCE A DAY.  Marland Kitchen B-D ULTRAFINE III SHORT PEN 31G X 8 MM MISC USE ONCE DAILY WITH LANTUS SOLOSTAR PEN.  . brimonidine-timolol (COMBIGAN) 0.2-0.5 % ophthalmic solution Place 1 drop into both eyes every 12 (twelve) hours.  . cetirizine (ZYRTEC) 10 MG tablet TAKE 1 TABLET BY MOUTH ONCE A DAY. (Patient taking differently: as needed. )  . Cholecalciferol (VITAMIN D3) 2000 units TABS Take 2,000 Units by mouth at bedtime.  . citalopram (CELEXA) 10 MG tablet TAKE 1 TABLET  BY MOUTH ONCE A DAY.  Marland Kitchen Cod Liver Oil CAPS Take by mouth. 500 MG per patient  . Cranberry 1000 MG CAPS Take 1,000 mg by mouth daily.  . cycloSPORINE (RESTASIS) 0.05 % ophthalmic emulsion Place 1 drop into both eyes 2 (two) times daily.   Marland Kitchen diltiazem (CARDIZEM CD) 240 MG 24 hr capsule TAKE (1) CAPSULE BY MOUTH TWICE DAILY.  Marland Kitchen ezetimibe (ZETIA) 10 MG tablet TAKE 1 TABLET BY MOUTH ONCE A DAY.  . fluticasone (FLONASE) 50 MCG/ACT nasal spray PLACE 2 SPRAYS INTO BOTH NOSTRILS DAILY.  Marland Kitchen gabapentin (NEURONTIN) 300 MG capsule TAKE (1) CAPSULE BY MOUTH TWICE DAILY.  . hydrALAZINE (APRESOLINE) 50 MG tablet TAKE 1 TABLET BY MOUTH THREE TIMES A DAY.  Marland Kitchen HYDROcodone-acetaminophen (NORCO) 10-325 MG tablet Take 1 tablet by mouth 2 (two) times daily.  Marland Kitchen HYDROcodone-acetaminophen (NORCO) 10-325 MG tablet Take one tablet by mouth two times daily for chronic pain  . Lancets (ONETOUCH DELICA PLUS 123XX123) MISC USE AS DIRECTED 3 TIMES DAILY FOR BLOOD GLUCOSE TESTING.  . LANTUS SOLOSTAR 100 UNIT/ML Solostar Pen INJECT 30 UNITS SUBCUTANEOUSLY ONCE DAILY.  Marland Kitchen linaclotide (LINZESS) 72 MCG capsule Take 1 capsule (72 mcg total) by mouth daily before breakfast.  . losartan (COZAAR) 100 MG tablet TAKE (1) TABLET BY MOUTH DAILY.  . Multiple Vitamin (MULTIVITAMIN WITH MINERALS) TABS tablet Take 1 tablet by mouth at bedtime.  . ondansetron (ZOFRAN-ODT) 8 MG disintegrating tablet Take 1 tablet (8 mg total) by mouth every 8 (eight) hours as needed for nausea or vomiting.  Glory Rosebush VERIO test strip USE AS DIRECTED TO TEST BLOOD GLUCOSE 3 TIMES DAILY.  . pantoprazole (PROTONIX) 40 MG tablet TAKE 1 TABLET BY MOUTH ONCE A DAY, 30 MINUTES BEFORE BREAKFAST.  Marland Kitchen predniSONE (DELTASONE) 5 MG tablet Take 1 tablet (5 mg total) by mouth daily.  Marland Kitchen PROAIR HFA 108 (90 Base) MCG/ACT inhaler INHALE 2 PUFFS BY MOUTH EVERY 6 HOURS AS NEEDED .  Marland Kitchen pyridostigmine (MESTINON) 60 MG tablet TAKE (1) TABLET BY MOUTH THREE TIMES A DAY.  . rosuvastatin  (CRESTOR) 40 MG tablet TAKE (1) TABLET BY MOUTH AT BEDTIME.  Marland Kitchen solifenacin (VESICARE) 10 MG tablet TAKE 1 TABLET BY MOUTH ONCE A DAY.  Marland Kitchen spironolactone (ALDACTONE) 25 MG tablet TAKE 1 TABLET BY MOUTH ONCE A DAY.  . TRADJENTA 5 MG TABS tablet TAKE 1 TABLET BY  MOUTH ONCE A DAY.  Marland Kitchen UNABLE TO FIND Diabetic shoes x 1 Inserts x 3 DX E11.9  . venlafaxine XR (EFFEXOR-XR) 150 MG 24 hr capsule TAKE 1 CAPSULE BY MOUTH DAILY.     Allergies:   Patient has no known allergies.   Social History   Tobacco Use  . Smoking status: Former Smoker    Packs/day: 0.50    Years: 25.00    Pack years: 12.50    Types: Cigarettes    Quit date: 01/01/2004    Years since quitting: 16.3  . Smokeless tobacco: Never Used  Substance Use Topics  . Alcohol use: No  . Drug use: No     Family Hx: The patient's family history includes Cancer in her father, mother, and sister; Diabetes in her sister and son; Heart disease in her brother, father, and mother; Hypertension in her brother, daughter, and son; Kidney failure in her sister. There is no history of Colon cancer.  ROS:   Please see the history of present illness.     All other systems reviewed and are negative.   Prior CV studies:   The following studies were reviewed today:  2010 echo Study Conclusions   1. Left ventricle: The cavity size was normal. Mild septal   hypertrophy. Systolic function was vigorous. The estimated   ejection fraction was in the range of 65% to 70%. Wall motion was   normal; there were no regional wall motion abnormalities.  2. Left atrium: The atrium was mildly dilated.  3. Pulmonary arteries: PA peak pressure: 95mm Hg (S).  Impressions:   - Compared to the previous study of 08/06/08, mitral regurgitation has   decreased; currently inadequate TR to estimate RV systolic   pressure.    IMPRESSIONS    1. Left ventricular ejection fraction, by estimation, is 70 to 75%. The  left ventricle has hyperdynamic  function. The left ventricle has no  regional wall motion abnormalities. There is moderate left ventricular  hypertrophy. Left ventricular diastolic  parameters are consistent with Grade I diastolic dysfunction (impaired  relaxation). Elevated left atrial pressure.  2. Right ventricular systolic function is normal. The right ventricular  size is normal.  3. Left atrial size was moderately dilated.  4. The mitral valve is normal in structure. Trivial mitral valve  regurgitation. No evidence of mitral stenosis.  5. The aortic valve is tricuspid. Aortic valve regurgitation is not  visualized. No aortic stenosis is present.  6. The inferior vena cava is normal in size with greater than 50%  respiratory variability, suggesting right atrial pressure of 3 mmHg.  Labs/Other Tests and Data Reviewed:    EKG:  No ECG reviewed.  Recent Labs: 09/24/2019: TSH 0.680 02/12/2020: ALT 15; BUN 18; Creatinine, Ser 0.96; Hemoglobin 8.5; Platelets 311; Potassium 3.4; Sodium 141   Recent Lipid Panel Lab Results  Component Value Date/Time   CHOL 173 02/10/2020 11:40 AM   TRIG 94 02/10/2020 11:40 AM   HDL 45 (L) 02/10/2020 11:40 AM   CHOLHDL 3.8 02/10/2020 11:40 AM   LDLCALC 109 (H) 02/10/2020 11:40 AM    Wt Readings from Last 3 Encounters:  04/25/20 240 lb (108.9 kg)  04/12/20 247 lb 6.4 oz (112.2 kg)  03/22/20 258 lb 6.4 oz (117.2 kg)     Objective:    Vital Signs:  Ht 5\' 6"  (1.676 m)   Wt 240 lb (108.9 kg)   BMI 38.74 kg/m    Normal affect. Normal speech pattern and tone. Comfortable, no  apparent distress. No audible signs of sob or wheezing.   ASSESSMENT & PLAN:    1. SOB - unclear etiology -  Overall benign echo other than mild diastolic dysfunction - SOB and generalized fatigue, no chest pains. Symptoms not suggestive of ischemic heart disease - significant history of anemia with Hgb down to 8.5 in Feb, repeat CBC - abnormal PFTs in 2010 with mild obstruction and resriction,  severe decrease in diffusion. Pending cbc and resolution of current sinus infection would repeat PFTs  2. Chest pain - assoicated with meals, not consistent with cardiac etiology - no recurretn issues, continue to monitor    COVID-19 Education: The signs and symptoms of COVID-19 were discussed with the patient and how to seek care for testing (follow up with PCP or arrange E-visit).  The importance of social distancing was discussed today.  Time:   Today, I have spent 19 minutes with the patient with telehealth technology discussing the above problems.     Medication Adjustments/Labs and Tests Ordered: Current medicines are reviewed at length with the patient today.  Concerns regarding medicines are outlined above.   Tests Ordered: No orders of the defined types were placed in this encounter.   Medication Changes: No orders of the defined types were placed in this encounter.   Follow Up:  Either In Person or Virtual in 3 month(s)  Signed, Carlyle Dolly, MD  04/25/2020 1:57 PM    [place]

## 2020-04-25 NOTE — Addendum Note (Signed)
Addended by: Barbarann Ehlers A on: 04/25/2020 04:00 PM   Modules accepted: Orders

## 2020-04-27 ENCOUNTER — Encounter: Payer: Medicare Other | Admitting: Gastroenterology

## 2020-04-28 ENCOUNTER — Other Ambulatory Visit: Payer: Self-pay | Admitting: Family Medicine

## 2020-04-29 ENCOUNTER — Emergency Department (HOSPITAL_COMMUNITY): Payer: Medicare Other

## 2020-04-29 ENCOUNTER — Ambulatory Visit: Payer: Medicare Other | Admitting: Gastroenterology

## 2020-04-29 ENCOUNTER — Encounter (HOSPITAL_COMMUNITY): Payer: Self-pay | Admitting: *Deleted

## 2020-04-29 ENCOUNTER — Other Ambulatory Visit: Payer: Self-pay

## 2020-04-29 ENCOUNTER — Encounter: Payer: Self-pay | Admitting: Gastroenterology

## 2020-04-29 ENCOUNTER — Emergency Department (HOSPITAL_COMMUNITY)
Admission: EM | Admit: 2020-04-29 | Discharge: 2020-04-29 | Disposition: A | Discharge status: Home / Self Care | Payer: Medicare Other | Attending: Emergency Medicine | Admitting: Emergency Medicine

## 2020-04-29 VITALS — BP 143/72 | HR 110 | Temp 97.3°F | Ht 66.0 in | Wt 235.2 lb

## 2020-04-29 DIAGNOSIS — E1122 Type 2 diabetes mellitus with diabetic chronic kidney disease: Secondary | ICD-10-CM | POA: Insufficient documentation

## 2020-04-29 DIAGNOSIS — Z87891 Personal history of nicotine dependence: Secondary | ICD-10-CM | POA: Insufficient documentation

## 2020-04-29 DIAGNOSIS — R0981 Nasal congestion: Secondary | ICD-10-CM | POA: Diagnosis present

## 2020-04-29 DIAGNOSIS — J019 Acute sinusitis, unspecified: Secondary | ICD-10-CM | POA: Diagnosis not present

## 2020-04-29 DIAGNOSIS — Z79899 Other long term (current) drug therapy: Secondary | ICD-10-CM | POA: Diagnosis not present

## 2020-04-29 DIAGNOSIS — R531 Weakness: Secondary | ICD-10-CM | POA: Diagnosis not present

## 2020-04-29 DIAGNOSIS — I129 Hypertensive chronic kidney disease with stage 1 through stage 4 chronic kidney disease, or unspecified chronic kidney disease: Secondary | ICD-10-CM | POA: Diagnosis not present

## 2020-04-29 DIAGNOSIS — N1831 Chronic kidney disease, stage 3a: Secondary | ICD-10-CM | POA: Diagnosis not present

## 2020-04-29 DIAGNOSIS — E039 Hypothyroidism, unspecified: Secondary | ICD-10-CM | POA: Diagnosis not present

## 2020-04-29 DIAGNOSIS — J0191 Acute recurrent sinusitis, unspecified: Secondary | ICD-10-CM

## 2020-04-29 LAB — BASIC METABOLIC PANEL
Anion gap: 13 (ref 5–15)
BUN: 20 mg/dL (ref 8–23)
CO2: 22 mmol/L (ref 22–32)
Calcium: 9.2 mg/dL (ref 8.9–10.3)
Chloride: 103 mmol/L (ref 98–111)
Creatinine, Ser: 1.19 mg/dL — ABNORMAL HIGH (ref 0.44–1.00)
GFR calc Af Amer: 51 mL/min — ABNORMAL LOW (ref >60)
GFR calc non Af Amer: 44 mL/min — ABNORMAL LOW (ref >60)
Glucose, Bld: 166 mg/dL — ABNORMAL HIGH (ref 70–99)
Potassium: 3.7 mmol/L (ref 3.5–5.1)
Sodium: 138 mmol/L (ref 135–145)

## 2020-04-29 LAB — CBC WITH DIFFERENTIAL/PLATELET
Abs Immature Granulocytes: 0.1 10*3/uL — ABNORMAL HIGH (ref 0.00–0.07)
Basophils Absolute: 0.1 10*3/uL (ref 0.0–0.1)
Basophils Relative: 1 %
Eosinophils Absolute: 0.2 10*3/uL (ref 0.0–0.5)
Eosinophils Relative: 2 %
HCT: 38 % (ref 36.0–46.0)
Hemoglobin: 11.2 g/dL — ABNORMAL LOW (ref 12.0–15.0)
Immature Granulocytes: 1 %
Lymphocytes Relative: 8 %
Lymphs Abs: 1.1 10*3/uL (ref 0.7–4.0)
MCH: 26.8 pg (ref 26.0–34.0)
MCHC: 29.5 g/dL — ABNORMAL LOW (ref 30.0–36.0)
MCV: 90.9 fL (ref 80.0–100.0)
Monocytes Absolute: 0.9 10*3/uL (ref 0.1–1.0)
Monocytes Relative: 7 %
Neutro Abs: 10.3 10*3/uL — ABNORMAL HIGH (ref 1.7–7.7)
Neutrophils Relative %: 81 %
Platelets: 350 10*3/uL (ref 150–400)
RBC: 4.18 MIL/uL (ref 3.87–5.11)
RDW: 18.3 % — ABNORMAL HIGH (ref 11.5–15.5)
WBC: 12.6 10*3/uL — ABNORMAL HIGH (ref 4.0–10.5)
nRBC: 0 % (ref 0.0–0.2)

## 2020-04-29 MED ORDER — ONDANSETRON HCL 4 MG PO TABS
4.0000 mg | ORAL_TABLET | Freq: Three times a day (TID) | ORAL | 0 refills | Status: DC | PRN
Start: 2020-04-29 — End: 2020-06-12

## 2020-04-29 MED ORDER — SODIUM CHLORIDE 0.9 % IV BOLUS
1000.0000 mL | Freq: Once | INTRAVENOUS | Status: AC
  Administered 2020-04-29: 15:00:00 1000 mL via INTRAVENOUS

## 2020-04-29 MED ORDER — DOXYCYCLINE HYCLATE 100 MG PO CAPS
100.0000 mg | ORAL_CAPSULE | Freq: Two times a day (BID) | ORAL | 0 refills | Status: DC
Start: 2020-04-29 — End: 2020-06-12

## 2020-04-29 NOTE — ED Triage Notes (Signed)
C/o sinus infection for 3 weeks, stats she hash been taking an antibiotic for same but does not seem to be improving

## 2020-04-29 NOTE — ED Provider Notes (Signed)
[facility] Provider Note   CSN: [id number] Arrival date & time: 04/29/20  1005     History Chief Complaint  Patient presents with  . Nasal Congestion    [name] is a 79 y.o. female.  She is complaining of 3 or 4 weeks of sinus problems with nasal congestion green discharge feeling weak feeling feverish and chilled.  She is taken 2 courses of antibiotics does not know what they are.  No improvement.  Denies any nausea vomiting diarrhea or urinary symptoms.  From the records in epic it looks like she received amoxicillin April 13.  Also prescribed Mucinex and Flonase.  The history is provided by the patient.  URI Presenting symptoms: congestion, facial pain, fatigue and rhinorrhea   Presenting symptoms: no fever and no sore throat   Severity:  Moderate Onset quality:  Gradual Timing:  Intermittent Progression:  Unchanged Chronicity:  Recurrent Relieved by:  Nothing Worsened by:  Nothing Ineffective treatments:  OTC medications and prescription medications Associated symptoms: sinus pain   Associated symptoms: no neck pain   Risk factors: no diabetes mellitus and no sick contacts        Past Medical History:  Diagnosis Date  . Anemia in chronic renal disease 12/30/2015  . Anxiety   . Arthritis    "all over" (01/19/2014)  . Arthritis, lumbar spine   . Carpal tunnel syndrome   . Chronic bronchitis ([place])    "got it q yr for awhile; hasn't had it in awhile" (01/19/2014)  . Chronic kidney disease (CKD) stage G3b/A1, moderately decreased glomerular filtration rate (GFR) between 30-44 mL/min/1.73 square meter and albuminuria creatinine ratio less than 30 mg/g 09/14/2009   Qualifier: Diagnosis of  By: Moshe Cipro MD, Joycelyn Schmid    . Chronic renal disease, stage 3, moderately decreased glomerular filtration rate (GFR) between 30-59 mL/min/1.73 square meter 09/14/2009   Qualifier: Diagnosis of  By: Moshe Cipro MD, Joycelyn Schmid    . Complication of anesthesia    combative  . COPD (chronic obstructive pulmonary disease) ([place])   . Depression   . Diverticulosis 07/2004   Colonscopy Dr Gala Romney  . Esophagitis, erosive 2009  . Exertional shortness of breath   . Gastroesophageal reflux   . [id number]    "usually a couple times/wk" (01/19/2014)  . Heart murmur    saw cardiology In [place], he told her she did not need to come back.  . Hyperlipidemia   . Hypertension   . Impingement syndrome, shoulder   . Iron deficiency anemia   . Mitral regurgitation   . Myasthenia gravis   . Myasthenia gravis in remission ([place]) 11/24/2014  . Myasthenia gravis in remission ([place])   . Obesity   . OSA on CPAP    Negative on last sleep study  . Pneumonia 01/2012  . Pulmonary HTN ([place])   . Schatzki's ring    Last EGD w/ dilation 02/08/11, 2009 & 2007  . Seasonal allergies   . Shoulder pain   . Thyroid disease    "used to take RX; they took me off it" (01/19/2014)  . Tobacco abuse   . Type II diabetes mellitus [facility])     Patient Active Problem List   Diagnosis Date Noted  . Elevated alkaline phosphatase measurement 10/21/2019  . Back pain 07/05/2019  . Rectal bleeding 01/08/2019  . Constipation 01/08/2019  . Nausea and vomiting 11/03/2018  . Trigger finger, right index finger   . Monoclonal gammopathy of unknown significance (MGUS) 11/26/2017  . Memory  loss of unknown cause 11/10/2017  . Chronic left shoulder pain 09/14/2017  . Encounter for pain management 02/09/2017  . Acute maxillary sinusitis 04/11/2016  . Anemia in chronic renal disease 12/30/2015  . Morbid obesity ([place]) 04/18/2015  . Myasthenia gravis in remission ([place]) 11/24/2014  . Myasthenia gravis ([place]) 11/16/2014  . Vitamin D deficiency 05/24/2014  . Osteopenia 04/26/2014  . Macular hole of left eye 01/19/2014  . Generalized osteoarthritis 05/25/2013  . Esophageal dysphagia 04/03/2012  . Abnormal chest CT 03/02/2012  . Insomnia 01/02/2012  . Malignant hypertension 05/30/2011  .  Schatzki's ring 05/07/2011  . INGROWN TOENAIL 01/10/2011  . GLAUCOMA 08/08/2010  . DYSCHROMIA, UNSPECIFIED 08/08/2010  . Chronic renal disease, stage 3, moderately decreased glomerular filtration rate (GFR) between 30-59 mL/min/1.73 square meter 09/14/2009  . HIP PAIN, LEFT 06/09/2009  . Urinary incontinence 06/09/2009  . IDA (iron deficiency anemia) 10/02/2008  . Hypothyroidism 09/27/2008  . Hyperlipemia 06/30/2008  . Depression with anxiety 06/30/2008  . GERD 06/30/2008  . Obstructive sleep apnea 06/30/2008  . CARPAL TUNNEL SYNDROME 05/19/2008  . SHOULDER PAIN 02/02/2008  . IMPINGEMENT SYNDROME 02/02/2008  . DEGENERATIVE JOINT DISEASE, KNEE 11/24/2007  . Diabetes mellitus, insulin dependent (IDDM), controlled 09/09/2007    Past Surgical History:  Procedure Laterality Date  . [place] VITRECTOMY WITH 20 GAUGE MVR PORT Left 01/19/2014   Procedure: 25 GAUGE PARS PLANA VITRECTOMY WITH 20 GAUGE MVR PORT; MEMBRAME PEEL; SERUM PATCH; LASER TREATMENT; C3F8;  Surgeon: Hayden Pedro, MD;  Location: [place];  Service: Ophthalmology;  Laterality: Left;  . ABDOMINAL HYSTERECTOMY    . AGILE CAPSULE N/A 08/05/2019   Procedure: AGILE CAPSULE;  Surgeon: Daneil Dolin, MD;  [facility];  Service: Endoscopy;  Laterality: N/A;  7:30am  . APPENDECTOMY    . CARPAL TUNNEL RELEASE Bilateral   . CATARACT EXTRACTION W/PHACO Left 10/21/2013   Procedure: LEFT CATARACT EXTRACTION PHACO AND INTRAOCULAR LENS PLACEMENT (IOC);  Surgeon: Marylynn Pearson, MD;  Location: [facility];  Service: Ophthalmology;  Laterality: Left;  . CHOLECYSTECTOMY    . COLONOSCOPY  05/08/2012   [id number] and external hemorrhoids; colonic diverticulosis  . COLONOSCOPY WITH PROPOFOL N/A 02/26/2019   two simple adenomas, diverticulosis,  and internal hemorrhoids.   . ESOPHAGEAL DILATION     "more than 3 times" (01/19/2014)  . ESOPHAGOGASTRODUODENOSCOPY  02/08/11   [place]-Distal esophageal erosion consistent with mild  erosive reflux   esophagitis/ Noncritical Schatzki ring, small hiatal hernia otherwise upper/ gastrointestinal tract appeared unremarkable, status post passage  of a Maloney dilation to biopsy disruption of the ring described  . ESOPHAGOGASTRODUODENOSCOPY  04/2012   2 tandem incomplete distal esophagea rings s/p dilation.   . ESOPHAGOGASTRODUODENOSCOPY N/A 09/16/2014   Dr. Gala Romney: Schatzki ring status post dilation/disruption.  Hiatal hernia.  Marland Kitchen ESOPHAGOGASTRODUODENOSCOPY (EGD) WITH PROPOFOL N/A 12/08/2018   EGD with mild Schatzki ring s/p dilation, small hiatal hernia, otherwise normal  . EYE SURGERY     cataracts, bilateral  . GIVENS CAPSULE STUDY N/A 09/29/2019   Procedure: GIVENS CAPSULE STUDY;  Surgeon: Daneil Dolin, MD;  [facility];  Service: Endoscopy;  Laterality: N/A;  7:30am  . INCONTINENCE SURGERY  08/26/09   [place]  . JOINT REPLACEMENT     right total knee  . MALONEY DILATION N/A 09/16/2014   Procedure: Venia Minks DILATION;  Surgeon: Daneil Dolin, MD;  [facility];  Service: Endoscopy;  Laterality: N/A;  . MALONEY DILATION N/A 12/08/2018   Procedure:  MALONEY DILATION;  Surgeon: Daneil Dolin, MD;  [facility];  Service: Endoscopy;  Laterality: N/A;  . PARS PLANA VITRECTOMY W/ REPAIR OF MACULAR HOLE Left 01/19/2014  . POLYPECTOMY  02/26/2019   Procedure: POLYPECTOMY;  Surgeon: Danie Binder, MD;  [facility];  Service: Endoscopy;;  ascending colon polyps x2  . SAVORY DILATION N/A 09/16/2014   Procedure: SAVORY DILATION;  Surgeon: Daneil Dolin, MD;  [facility];  Service: Endoscopy;  Laterality: N/A;  . TONSILLECTOMY    . TOTAL KNEE ARTHROPLASTY Right 05/13/07   Dr. Aline Brochure  . TRIGGER FINGER RELEASE Right 06/26/2018   Procedure: RIGHT INDEX FINGER TRIGGER FINGER/A-1 PULLEY RELEASE;  Surgeon: Carole Civil, MD;  Location: [facility];  Service: Orthopedics;  Laterality: Right;     OB History   No obstetric history  on file.     Family History  Problem Relation Age of Onset  . Cancer Mother   . Heart disease Mother   . Cancer Father   . Heart disease Father   . Diabetes Sister   . Hypertension Brother   . Heart disease Brother   . Cancer Sister   . Kidney failure Sister   . Diabetes Son   . Hypertension Son   . Hypertension Daughter   . Colon cancer Neg Hx     Social History   Tobacco Use  . Smoking status: Former Smoker    Packs/day: 0.50    Years: 25.00    Pack years: 12.50    Types: Cigarettes    Quit date: 01/01/2004    Years since quitting: 16.3  . Smokeless tobacco: Never Used  Substance Use Topics  . Alcohol use: No  . Drug use: No    Home Medications Prior to Admission medications   Medication Sig Start Date End Date Taking? Authorizing Provider  ACETAMINOPHEN EXTRA STRENGTH 500 MG tablet TAKE (1) TABLET BY MOUTH AT BEDTIME. 04/20/20   Fayrene Helper, MD  amLODipine (NORVASC) 10 MG tablet TAKE 1 TABLET BY MOUTH ONCE A DAY. 03/18/20   Fayrene Helper, MD  B-D ULTRAFINE III SHORT PEN 31G X 8 MM MISC USE ONCE DAILY WITH LANTUS SOLOSTAR PEN. 01/21/20   Fayrene Helper, MD  brimonidine-timolol (COMBIGAN) 0.2-0.5 % ophthalmic solution Place 1 drop into both eyes every 12 (twelve) hours.    [provider]  cetirizine (ZYRTEC) 10 MG tablet TAKE 1 TABLET BY MOUTH ONCE A DAY. Patient taking differently: as needed.  03/18/20   Fayrene Helper, MD  Cholecalciferol (VITAMIN D3) 2000 units TABS Take 2,000 Units by mouth at bedtime.    [provider]  citalopram (CELEXA) 10 MG tablet TAKE 1 TABLET BY MOUTH ONCE A DAY. 04/14/20   Fayrene Helper, MD  [facility] Liver Oil CAPS Take by mouth. 500 MG per patient    [provider]  Cranberry 1000 MG CAPS Take 1,000 mg by mouth daily.    [provider]  cycloSPORINE (RESTASIS) 0.05 % ophthalmic emulsion Place 1 drop into both eyes 2 (two) times daily.     [provider]  diltiazem  (CARDIZEM CD) 240 MG 24 hr capsule TAKE (1) CAPSULE BY MOUTH TWICE DAILY. 03/18/20   Fayrene Helper, MD  ezetimibe (ZETIA) 10 MG tablet TAKE 1 TABLET BY MOUTH ONCE A DAY. 03/18/20   Fayrene Helper, MD  fluticasone (FLONASE) 50 MCG/ACT nasal spray PLACE 2 SPRAYS INTO BOTH NOSTRILS DAILY. 04/20/20  Fayrene Helper, MD  gabapentin (NEURONTIN) 300 MG capsule TAKE (1) CAPSULE BY MOUTH TWICE DAILY. 03/18/20   Fayrene Helper, MD  hydrALAZINE (APRESOLINE) 50 MG tablet TAKE 1 TABLET BY MOUTH THREE TIMES A DAY. 03/18/20   Fayrene Helper, MD  HYDROcodone-acetaminophen (NORCO) 10-325 MG tablet Take 1 tablet by mouth 2 (two) times daily. 03/04/20   [provider]  HYDROcodone-acetaminophen [facility]) 10-325 MG tablet Take one tablet by mouth two times daily for chronic pain 04/06/20   Fayrene Helper, MD  Lancets (ONETOUCH DELICA PLUS [phone]) [place] USE AS DIRECTED 3 TIMES DAILY FOR BLOOD GLUCOSE TESTING. 01/21/20   Fayrene Helper, MD  LANTUS SOLOSTAR 100 UNIT/ML Solostar Pen INJECT 30 UNITS SUBCUTANEOUSLY ONCE DAILY. 11/23/19   Fayrene Helper, MD  linaclotide [facility]) 72 MCG capsule Take 1 capsule (72 mcg total) by mouth daily before breakfast. 12/16/19   Annitta Needs, NP  losartan (COZAAR) 100 MG tablet TAKE (1) TABLET BY MOUTH DAILY. 03/18/20   Fayrene Helper, MD  Multiple Vitamin (MULTIVITAMIN WITH MINERALS) TABS tablet Take 1 tablet by mouth at bedtime.    [provider]  ondansetron (ZOFRAN-ODT) 8 MG disintegrating tablet Take 1 tablet (8 mg total) by mouth every 8 (eight) hours as needed for nausea or vomiting. 09/23/19   Fayrene Helper, MD  [facility] VERIO test strip USE AS DIRECTED TO TEST BLOOD GLUCOSE 3 TIMES DAILY. 04/22/20   Fayrene Helper, MD  pantoprazole (PROTONIX) 40 MG tablet TAKE 1 TABLET BY MOUTH ONCE A DAY, 30 MINUTES BEFORE BREAKFAST. 01/21/20   Mahala Menghini, PA-C  predniSONE (DELTASONE) 5 MG tablet Take 1 tablet (5 mg total) by  mouth daily. 06/01/19   Carole Civil, MD  PROAIR HFA 108 [phone] Base) MCG/ACT inhaler INHALE 2 PUFFS BY MOUTH EVERY 6 HOURS AS NEEDED . 11/23/19   Fayrene Helper, MD  pyridostigmine (MESTINON) 60 MG tablet TAKE (1) TABLET BY MOUTH THREE TIMES A DAY. 03/18/20   Fayrene Helper, MD  rosuvastatin (CRESTOR) 40 MG tablet TAKE (1) TABLET BY MOUTH AT BEDTIME. 03/18/20   Fayrene Helper, MD  solifenacin (VESICARE) 10 MG tablet TAKE 1 TABLET BY MOUTH ONCE A DAY. 03/21/20   Perlie Mayo, NP  spironolactone (ALDACTONE) 25 MG tablet TAKE 1 TABLET BY MOUTH ONCE A DAY. 04/14/20   Fayrene Helper, MD  TRADJENTA 5 MG TABS tablet TAKE 1 TABLET BY MOUTH ONCE A DAY. 03/18/20   Fayrene Helper, MD  UNABLE TO FIND Diabetic shoes x 1 Inserts x 3 DX E11.9 07/21/18   Fayrene Helper, MD  venlafaxine XR (EFFEXOR-XR) 150 MG 24 hr capsule TAKE 1 CAPSULE BY MOUTH DAILY. 03/18/20   Fayrene Helper, MD    Allergies    Patient has no known allergies.  Review of Systems   Review of Systems  Constitutional: Positive for fatigue. Negative for fever.  HENT: Positive for congestion, postnasal drip, rhinorrhea and sinus pain. Negative for sore throat.   Eyes: Negative for visual disturbance.  Respiratory: Negative for shortness of breath.   Cardiovascular: Negative for chest pain.  Gastrointestinal: Negative for abdominal pain.  Genitourinary: Negative for dysuria.  Musculoskeletal: Negative for neck pain.  Skin: Negative for rash.  Neurological: Negative for speech difficulty.    Physical Exam Updated Vital Signs BP (!) 148/91   Pulse 79   Temp 97.7 F (36.5 C)   Resp 20   SpO2 99%   Physical  Exam Vitals and nursing note reviewed.  Constitutional:      General: She is not in acute distress.    Appearance: She is well-developed.  HENT:     Head: Normocephalic and atraumatic.     Right Ear: Tympanic membrane normal.     Left Ear: Tympanic membrane normal.     Nose: Congestion  present.     Mouth/Throat:     Mouth: Mucous membranes are moist.     Pharynx: Oropharynx is clear. No oropharyngeal exudate.  Eyes:     Conjunctiva/sclera: Conjunctivae normal.  Cardiovascular:     Rate and Rhythm: Normal rate and regular rhythm.     Heart sounds: No murmur.  Pulmonary:     Effort: Pulmonary effort is normal. No respiratory distress.     Breath sounds: Normal breath sounds.  Abdominal:     Palpations: Abdomen is soft.     Tenderness: There is no abdominal tenderness.  Musculoskeletal:        General: Normal range of motion.     Cervical back: Neck supple.     Right lower leg: No edema.     Left lower leg: No edema.  Skin:    General: Skin is warm and dry.     Capillary Refill: Capillary refill takes less than 2 seconds.  Neurological:     General: No focal deficit present.     Mental Status: She is alert.     ED Results / Procedures / Treatments   Labs (all labs ordered are listed, but only abnormal results are displayed) Labs Reviewed  BASIC METABOLIC PANEL - Abnormal; Notable for the following components:      Result Value   Glucose, Bld 166 (*)    Creatinine, Ser 1.19 (*)    GFR calc non Af Amer 44 (*)    GFR calc Af Amer 51 (*)    All other components within normal limits  CBC WITH DIFFERENTIAL/PLATELET - Abnormal; Notable for the following components:   WBC 12.6 (*)    Hemoglobin 11.2 (*)    MCHC 29.5 (*)    RDW 18.3 (*)    Neutro Abs 10.3 (*)    Abs Immature Granulocytes 0.10 (*)    All other components within normal limits  URINALYSIS, ROUTINE W REFLEX MICROSCOPIC    EKG None  Radiology DG Chest Port 1 View  Result Date: 04/29/2020 CLINICAL DATA:  C/o sinus infection for 3 weeks, stats she hash been taking an antibiotic for same but does not seem to be improving. Weakness Hx of COPD, htn, diabetes. Former smoker weakness EXAM: PORTABLE CHEST 1 VIEW COMPARISON:  Radiograph 01/19/2014 FINDINGS: Normal cardiac silhouette. There is chronic  bronchitic markings centrally. No effusion, infiltrate or pneumothorax. Degenerate changes of the shoulders. No acute findings IMPRESSION: 1. No acute cardiopulmonary findings. 2. Chronic bronchitic lung markings. Electronically Signed   By: Suzy Bouchard M.D.   On: 04/29/2020 12:51    Procedures Procedures (including critical care time)  Medications Ordered in ED Medications  sodium chloride 0.9 % bolus 1,000 mL (0 mLs Intravenous Stopped 04/29/20 1843)    ED Course  I have reviewed the triage vital signs and the nursing notes.  Pertinent labs & imaging results that were available during my care of the patient were reviewed by me and considered in my medical decision making (see chart for details).  Clinical Course as of Apr 30 1851  Fri Apr 29, 2020  1533 Patient states she is feels better  and wants to go home. She has not gotten much of her fluids but will send her home. Changing her antibiotic.   [MB]    Clinical Course User Index [MB] Hayden Rasmussen, MD   MDM Rules/Calculators/A&P                     This patient complains of nasal congestion malaise; this involves an extensive number of treatment Options and is a complaint that carries with it a high risk of complications and Morbidity. The differential includes sinusitis, pneumonia, anemia, metabolic derangement, colitis  I ordered, reviewed and interpreted labs, which included white blood cell count elevated at 12.6, chemistry showing elevated creatinine of 1.19 but baseline for patient and elevated glucose of 166 baseline for patient I ordered medication IV fluids I ordered imaging studies which included chest x-ray and I independently    visualized and interpreted imaging which showed no gross infiltrates  After the interventions stated above, I reevaluated the patient and found patient symptoms to be improved.  She is asking for me to start her on a different antibiotic.  We will also prescribe her some nausea  medication.  Stressed that she needs to follow-up with her primary care doctor for close follow-up.  Return instructions discussed   Final Clinical Impression(s) / ED Diagnoses Final diagnoses:  Acute recurrent sinusitis, unspecified location    Rx / DC Orders ED Discharge Orders         Ordered    doxycycline (VIBRAMYCIN) 100 MG capsule  2 times daily     04/29/20 1449    ondansetron (ZOFRAN) 4 MG tablet  Every 8 hours PRN     04/29/20 1449           Hayden Rasmussen, MD 04/29/20 [phone]

## 2020-04-29 NOTE — ED Notes (Signed)
IV attempted x one by this RN

## 2020-04-29 NOTE — Progress Notes (Signed)
Appt cancelled and will be rescheduled for banding. Patient is weak, unable to ambulate on own. Daughter taking to ED.

## 2020-04-29 NOTE — Discharge Instructions (Signed)
You were seen in the emergency department for sinusitis and generalized weakness. Your lab work was unremarkable. You received some IV fluids. We are starting you on a new course of antibiotics for your sinus infection. Please schedule a follow-up appointment with your doctor. Return to the emergency department if any worsening or concerning symptoms

## 2020-05-02 ENCOUNTER — Other Ambulatory Visit: Payer: Self-pay | Admitting: Family Medicine

## 2020-05-03 ENCOUNTER — Other Ambulatory Visit: Payer: Self-pay | Admitting: Family Medicine

## 2020-05-12 ENCOUNTER — Telehealth: Payer: Self-pay

## 2020-05-12 ENCOUNTER — Ambulatory Visit: Payer: Medicare Other | Admitting: Family Medicine

## 2020-05-12 NOTE — Telephone Encounter (Signed)
Pt called and needs pain med.  She wants her appt on 5/21 moved up if we have a cancellation

## 2020-05-16 ENCOUNTER — Other Ambulatory Visit: Payer: Self-pay | Admitting: Family Medicine

## 2020-05-20 ENCOUNTER — Telehealth (INDEPENDENT_AMBULATORY_CARE_PROVIDER_SITE_OTHER): Payer: Medicare Other | Admitting: Family Medicine

## 2020-05-20 ENCOUNTER — Other Ambulatory Visit: Payer: Self-pay

## 2020-05-20 ENCOUNTER — Encounter: Payer: Medicare Other | Admitting: Gastroenterology

## 2020-05-20 ENCOUNTER — Encounter: Payer: Self-pay | Admitting: Family Medicine

## 2020-05-20 VITALS — BP 110/59 | Ht 66.0 in | Wt 235.0 lb

## 2020-05-20 DIAGNOSIS — M170 Bilateral primary osteoarthritis of knee: Secondary | ICD-10-CM | POA: Diagnosis not present

## 2020-05-20 DIAGNOSIS — M5440 Lumbago with sciatica, unspecified side: Secondary | ICD-10-CM | POA: Diagnosis not present

## 2020-05-20 DIAGNOSIS — R52 Pain, unspecified: Secondary | ICD-10-CM | POA: Diagnosis not present

## 2020-05-20 DIAGNOSIS — G8929 Other chronic pain: Secondary | ICD-10-CM | POA: Diagnosis not present

## 2020-05-20 MED ORDER — HYDROCODONE-ACETAMINOPHEN 5-325 MG PO TABS: 1.0000 | ORAL_TABLET | Freq: Two times a day (BID) | ORAL | 0 refills | Status: DC

## 2020-05-20 NOTE — Progress Notes (Signed)
Virtual Visit via Telephone Note   This visit type was conducted due to national recommendations for restrictions regarding the COVID-19 Pandemic (e.g. social distancing) in an effort to limit this patient's exposure and mitigate transmission in our community.  Due to her co-morbid illnesses, this patient is at least at moderate risk for complications without adequate follow up.  This format is felt to be most appropriate for this patient at this time.  The patient did not have access to video technology/had technical difficulties with video requiring transitioning to audio format only (telephone).  All issues noted in this document were discussed and addressed.  No physical exam could be performed with this format.    Evaluation Performed:  Follow-up visit  Date:  05/20/2020   ID:  Jenna, Washington 10-29-1941, MRN [id number]  Patient Location: Home Provider Location: Office  Location of Patient: Home Location of Provider: Telehealth Consent was obtain for visit to be over via telehealth. I verified that I am speaking with the correct person using two identifiers.  PCP:  Fayrene Helper, MD   Chief Complaint: Pain management follow-up  History of Present Illness:    [name] is a 79 y.o. female with history of arthritis, carpal tunnel syndrome, renal disease, COPD, obesity among others.  Presents today for pain management follow-up via the phone.  Indication for chronic opioid: Arthritis throughout the body, including lumbar spine, knees and shoulders Medication and dose: Norco, 5-325 mg twice daily # pills per month: 60 Last UDS date: April 9 Will need repeat at next visit. Opioid Treatment Agreement signed (Y/N): Yes Opioid Treatment Agreement last reviewed with patient:  Yes [place] reviewed this encounter (include red flags): Yes  Ms. Morrissey was encouraged to look at reduction in medication her dose was reduced from 10 mg to 5 mg of hydrocodone  acetaminophen combo.  She reports that she just feels like she needs this pain medicine so that she can function.  Is not able to get to pain management clinic.  But is willing to try reduction to see if that is more beneficial secondary to the high risk of medication.  The patient does not have symptoms concerning for COVID-19 infection (fever, chills, cough, or new shortness of breath).   Past Medical, Surgical, Social History, Allergies, and Medications have been Reviewed.  Past Medical History:  Diagnosis Date  . Anemia in chronic renal disease 12/30/2015  . Anxiety   . Arthritis    "all over" (01/19/2014)  . Arthritis, lumbar spine   . Carpal tunnel syndrome   . Chronic bronchitis ([place])    "got it q yr for awhile; hasn't had it in awhile" (01/19/2014)  . Chronic kidney disease (CKD) stage G3b/A1, moderately decreased glomerular filtration rate (GFR) between 30-44 mL/min/1.73 square meter and albuminuria creatinine ratio less than 30 mg/g 09/14/2009   Qualifier: Diagnosis of  By: Moshe Cipro MD, Joycelyn Schmid    . Chronic renal disease, stage 3, moderately decreased glomerular filtration rate (GFR) between 30-59 mL/min/1.73 square meter 09/14/2009   Qualifier: Diagnosis of  By: Moshe Cipro MD, Joycelyn Schmid    . Complication of anesthesia    combative  . COPD (chronic obstructive pulmonary disease) ([place])   . Depression   . Diverticulosis 07/2004   Colonscopy Dr Gala Romney  . Esophagitis, erosive 2009  . Exertional shortness of breath   . Gastroesophageal reflux   . [id number])    "usually a couple times/wk" (01/19/2014)  . Heart murmur  saw cardiology In [place], he told her she did not need to come back.  . Hyperlipidemia   . Hypertension   . Impingement syndrome, shoulder   . Iron deficiency anemia   . Mitral regurgitation   . Myasthenia gravis   . Myasthenia gravis in remission ([place]) 11/24/2014  . Myasthenia gravis in remission ([place])   . Obesity   . OSA on CPAP    Negative on last sleep  study  . Pneumonia 01/2012  . Pulmonary HTN ([place])   . Schatzki's ring    Last EGD w/ dilation 02/08/11, 2009 & 2007  . Seasonal allergies   . Shoulder pain   . Thyroid disease    "used to take RX; they took me off it" (01/19/2014)  . Tobacco abuse   . Type II diabetes mellitus ([place])    Past Surgical History:  Procedure Laterality Date  . [place] VITRECTOMY WITH 20 GAUGE MVR PORT Left 01/19/2014   Procedure: 25 GAUGE PARS PLANA VITRECTOMY WITH 20 GAUGE MVR PORT; MEMBRAME PEEL; SERUM PATCH; LASER TREATMENT; C3F8;  Surgeon: Hayden Pedro, MD;  Location: [facility];  Service: Ophthalmology;  Laterality: Left;  . ABDOMINAL HYSTERECTOMY    . AGILE CAPSULE N/A 08/05/2019   Procedure: AGILE CAPSULE;  Surgeon: Daneil Dolin, MD;  [facility];  Service: Endoscopy;  Laterality: N/A;  7:30am  . APPENDECTOMY    . CARPAL TUNNEL RELEASE Bilateral   . CATARACT EXTRACTION W/PHACO Left 10/21/2013   Procedure: LEFT CATARACT EXTRACTION PHACO AND INTRAOCULAR LENS PLACEMENT (IOC);  Surgeon: Marylynn Pearson, MD;  Location: [facility];  Service: Ophthalmology;  Laterality: Left;  . CHOLECYSTECTOMY    . COLONOSCOPY  05/08/2012   [id number] and external hemorrhoids; colonic diverticulosis  . COLONOSCOPY WITH PROPOFOL N/A 02/26/2019   two simple adenomas, diverticulosis,  and internal hemorrhoids.   . ESOPHAGEAL DILATION     "more than 3 times" (01/19/2014)  . ESOPHAGOGASTRODUODENOSCOPY  02/08/11   [place]-Distal esophageal erosion consistent with mild erosive reflux   esophagitis/ Noncritical Schatzki ring, small hiatal hernia otherwise upper/ gastrointestinal tract appeared unremarkable, status post passage  of a Maloney dilation to biopsy disruption of the ring described  . ESOPHAGOGASTRODUODENOSCOPY  04/2012   2 tandem incomplete distal esophagea rings s/p dilation.   . ESOPHAGOGASTRODUODENOSCOPY N/A 09/16/2014   Dr. Gala Romney: Schatzki ring status post dilation/disruption.  Hiatal hernia.  Marland Kitchen  ESOPHAGOGASTRODUODENOSCOPY (EGD) WITH PROPOFOL N/A 12/08/2018   EGD with mild Schatzki ring s/p dilation, small hiatal hernia, otherwise normal  . EYE SURGERY     cataracts, bilateral  . GIVENS CAPSULE STUDY N/A 09/29/2019   Procedure: GIVENS CAPSULE STUDY;  Surgeon: Daneil Dolin, MD;  [facility];  Service: Endoscopy;  Laterality: N/A;  7:30am  . INCONTINENCE SURGERY  08/26/09   [place]  . JOINT REPLACEMENT     right total knee  . MALONEY DILATION N/A 09/16/2014   Procedure: Venia Minks DILATION;  Surgeon: Daneil Dolin, MD;  [facility];  Service: Endoscopy;  Laterality: N/A;  . Venia Minks DILATION N/A 12/08/2018   Procedure: Venia Minks DILATION;  Surgeon: Daneil Dolin, MD;  [facility];  Service: Endoscopy;  Laterality: N/A;  . PARS PLANA VITRECTOMY W/ REPAIR OF MACULAR HOLE Left 01/19/2014  . POLYPECTOMY  02/26/2019   Procedure: POLYPECTOMY;  Surgeon: Danie Binder, MD;  [facility];  Service: Endoscopy;;  ascending colon polyps x2  . SAVORY DILATION N/A 09/16/2014  Procedure: SAVORY DILATION;  Surgeon: Daneil Dolin, MD;  [facility];  Service: Endoscopy;  Laterality: N/A;  . TONSILLECTOMY    . TOTAL KNEE ARTHROPLASTY Right 05/13/07   Dr. Aline Brochure  . TRIGGER FINGER RELEASE Right 06/26/2018   Procedure: RIGHT INDEX FINGER TRIGGER FINGER/A-1 PULLEY RELEASE;  Surgeon: Carole Civil, MD;  [facility];  Service: Orthopedics;  Laterality: Right;     Current Meds  Medication Sig  . ACETAMINOPHEN EXTRA STRENGTH 500 MG tablet TAKE (1) TABLET BY MOUTH AT BEDTIME.  Marland Kitchen amLODipine (NORVASC) 10 MG tablet TAKE 1 TABLET BY MOUTH ONCE A DAY.  Marland Kitchen B-D ULTRAFINE III SHORT PEN 31G X 8 MM MISC USE ONCE DAILY WITH LANTUS SOLOSTAR PEN.  . brimonidine-timolol (COMBIGAN) 0.2-0.5 % ophthalmic solution Place 1 drop into both eyes every 12 (twelve) hours.  . cetirizine (ZYRTEC) 10 MG tablet TAKE 1 TABLET BY MOUTH ONCE A DAY.  Marland Kitchen Cholecalciferol  (VITAMIN D3) 2000 units TABS Take 2,000 Units by mouth at bedtime.  . citalopram (CELEXA) 10 MG tablet TAKE 1 TABLET BY MOUTH ONCE A DAY.  Marland Kitchen Cod Liver Oil CAPS Take by mouth. 500 MG per patient  . Cranberry 1000 MG CAPS Take 1,000 mg by mouth daily.  . cycloSPORINE (RESTASIS) 0.05 % ophthalmic emulsion Place 1 drop into both eyes 2 (two) times daily.   Marland Kitchen diltiazem (CARDIZEM CD) 240 MG 24 hr capsule TAKE (1) CAPSULE BY MOUTH TWICE DAILY.  Marland Kitchen doxycycline (VIBRAMYCIN) 100 MG capsule Take 1 capsule (100 mg total) by mouth 2 (two) times daily.  Marland Kitchen ezetimibe (ZETIA) 10 MG tablet TAKE 1 TABLET BY MOUTH ONCE A DAY.  . fluticasone (FLONASE) 50 MCG/ACT nasal spray PLACE 2 SPRAYS INTO BOTH NOSTRILS DAILY.  Marland Kitchen gabapentin (NEURONTIN) 300 MG capsule TAKE (1) CAPSULE BY MOUTH TWICE DAILY.  . hydrALAZINE (APRESOLINE) 50 MG tablet TAKE 1 TABLET BY MOUTH THREE TIMES A DAY.  Marland Kitchen HYDROcodone-acetaminophen (NORCO) 10-325 MG tablet Take one tablet by mouth two times daily for chronic pain  . Lancets (ONETOUCH DELICA PLUS 123XX123) MISC USE AS DIRECTED 3 TIMES DAILY FOR BLOOD GLUCOSE TESTING.  . LANTUS SOLOSTAR 100 UNIT/ML Solostar Pen INJECT 30 UNITS SUBCUTANEOUSLY ONCE DAILY.  Marland Kitchen linaclotide (LINZESS) 72 MCG capsule Take 1 capsule (72 mcg total) by mouth daily before breakfast.  . losartan (COZAAR) 100 MG tablet TAKE (1) TABLET BY MOUTH DAILY.  . Multiple Vitamin (MULTIVITAMIN WITH MINERALS) TABS tablet Take 1 tablet by mouth at bedtime.  . ondansetron (ZOFRAN) 4 MG tablet Take 1 tablet (4 mg total) by mouth every 8 (eight) hours as needed for nausea or vomiting.  . ondansetron (ZOFRAN-ODT) 8 MG disintegrating tablet Take 1 tablet (8 mg total) by mouth every 8 (eight) hours as needed for nausea or vomiting.  Glory Rosebush VERIO test strip USE AS DIRECTED TO TEST BLOOD GLUCOSE 3 TIMES DAILY.  . pantoprazole (PROTONIX) 40 MG tablet TAKE 1 TABLET BY MOUTH ONCE A DAY, 30 MINUTES BEFORE BREAKFAST.  Marland Kitchen predniSONE (DELTASONE) 5 MG  tablet Take 1 tablet (5 mg total) by mouth daily.  Marland Kitchen PROAIR HFA 108 (90 Base) MCG/ACT inhaler INHALE 2 PUFFS BY MOUTH EVERY 6 HOURS AS NEEDED .  Marland Kitchen pyridostigmine (MESTINON) 60 MG tablet TAKE (1) TABLET BY MOUTH THREE TIMES A DAY.  . rosuvastatin (CRESTOR) 40 MG tablet TAKE (1) TABLET BY MOUTH AT BEDTIME.  Marland Kitchen solifenacin (VESICARE) 10 MG tablet TAKE 1 TABLET BY MOUTH ONCE A DAY.  Marland Kitchen spironolactone (ALDACTONE)  25 MG tablet TAKE 1 TABLET BY MOUTH ONCE A DAY.  . TRADJENTA 5 MG TABS tablet TAKE 1 TABLET BY MOUTH ONCE A DAY.  Marland Kitchen UNABLE TO FIND Diabetic shoes x 1 Inserts x 3 DX E11.9  . venlafaxine XR (EFFEXOR-XR) 150 MG 24 hr capsule TAKE 1 CAPSULE BY MOUTH DAILY.     Allergies:   Patient has no known allergies.   ROS:   Please see the history of present illness.    All other systems reviewed and are negative.   Labs/Other Tests and Data Reviewed:    Recent Labs: 09/24/2019: TSH 0.680 02/12/2020: ALT 15 04/29/2020: BUN 20; Creatinine, Ser 1.19; Hemoglobin 11.2; Platelets 350; Potassium 3.7; Sodium 138   Recent Lipid Panel Lab Results  Component Value Date/Time   CHOL 173 02/10/2020 11:40 AM   TRIG 94 02/10/2020 11:40 AM   HDL 45 (L) 02/10/2020 11:40 AM   CHOLHDL 3.8 02/10/2020 11:40 AM   LDLCALC 109 (H) 02/10/2020 11:40 AM    Wt Readings from Last 3 Encounters:  05/20/20 235 lb (106.6 kg)  04/29/20 235 lb 3.2 oz (106.7 kg)  04/25/20 240 lb (108.9 kg)     Objective:    Vital Signs:  BP (!) 110/59   Ht 5\' 6"  (1.676 m)   Wt 235 lb (106.6 kg)   BMI 37.93 kg/m    VITAL SIGNS:  reviewed GEN:  alert and oriented RESPIRATORY:  no shortness of breath in conversation PSYCH:  normal affect and mood   ASSESSMENT & PLAN:    1. Encounter for chronic pain management  - HYDROcodone-acetaminophen (NORCO) 5-325 MG tablet; Take 1 tablet by mouth 2 (two) times daily.  Dispense: 60 tablet; Refill: 0  2. Chronic midline low back pain with sciatica, sciatica laterality unspecified  -  HYDROcodone-acetaminophen (NORCO) 5-325 MG tablet; Take 1 tablet by mouth 2 (two) times daily.  Dispense: 60 tablet; Refill: 0  3. Osteoarthritis of both knees, unspecified osteoarthritis type  - HYDROcodone-acetaminophen (NORCO) 5-325 MG tablet; Take 1 tablet by mouth 2 (two) times daily.  Dispense: 60 tablet; Refill: 0   Time:   Today, I have spent 10 minutes with the patient with telehealth technology discussing the above problems.     Medication Adjustments/Labs and Tests Ordered: Current medicines are reviewed at length with the patient today.  Concerns regarding medicines are outlined above.   Tests Ordered: No orders of the defined types were placed in this encounter.   Medication Changes: No orders of the defined types were placed in this encounter.   Disposition:  Follow up 06/21/2020  Signed, Perlie Mayo, NP  05/20/2020 11:14 AM     [facility]

## 2020-05-24 ENCOUNTER — Encounter: Payer: Self-pay | Admitting: Family Medicine

## 2020-05-24 DIAGNOSIS — M170 Bilateral primary osteoarthritis of knee: Secondary | ICD-10-CM | POA: Insufficient documentation

## 2020-05-24 NOTE — Assessment & Plan Note (Signed)
Continue Norco at reduced dose at this time.  Close follow-up with Dr. Moshe Cipro in the office for continuing secondary to high risk for medication.

## 2020-05-24 NOTE — Assessment & Plan Note (Signed)
  The patient's controlled substance registry is reviewed for compliance.  Adequacy of pain control and level of function was assessed.  Medication dose was adjusted as appropriate.  Follow-up with Dr. Moshe Cipro at next appointment for pain medication management.  Please see below for more detail.   [name] was encouraged to look at reduction in medication her dose was reduced from 10 mg to 5 mg of hydrocodone acetaminophen combo.  She reports that she just feels like she needs this pain medicine so that she can function.  Is not able to get to pain management clinic.  But is willing to try reduction to see if that is more beneficial secondary to the high risk of medication.

## 2020-05-24 NOTE — Assessment & Plan Note (Signed)
Bilateral osteoarthritis in her knees.  Continue Norco at reduced dose.  Education on the high fall risk with the use of narcotic medication.  She was agreement to the reduced dose at this time.  We will follow-up in the near future with Dr. Moshe Cipro to continue weaning.

## 2020-05-24 NOTE — Patient Instructions (Signed)
I appreciate the opportunity to provide you with care for your health and wellness. Today we discussed: Pain management medication reduction  Follow up: 06/21/2020; additionally needs 11-week follow-up for pain management with Dr. Moshe Cipro.  No labs or referrals today  We have adjusted your pain medicine to a reduced dose to help prevent fall risk.   Dr. Moshe Cipro will continue to discuss reduction of this medication and hopefully  transferring you to another medication in the near future as we wean you off this one for safety.  Please continue to practice social distancing to keep you, your family, and our community safe.  If you must go out, please wear a mask and practice good handwashing.  It was a pleasure to see you and I look forward to continuing to work together on your health and well-being. Please do not hesitate to call the office if you need care or have questions about your care.  Have a wonderful day and week. With Gratitude, Cherly Beach, DNP, AGNP-BC

## 2020-06-07 ENCOUNTER — Inpatient Hospital Stay (HOSPITAL_COMMUNITY): Payer: Medicare Other

## 2020-06-11 ENCOUNTER — Inpatient Hospital Stay (HOSPITAL_COMMUNITY)
Admission: EM | Admit: 2020-06-11 | Discharge: 2020-06-20 | DRG: 372 | Disposition: A | Discharge status: Skilled Nursing Facility | Payer: Medicare Other | Attending: Family Medicine | Admitting: Family Medicine

## 2020-06-11 ENCOUNTER — Other Ambulatory Visit: Payer: Self-pay

## 2020-06-11 ENCOUNTER — Encounter (HOSPITAL_COMMUNITY): Payer: Self-pay | Admitting: *Deleted

## 2020-06-11 DIAGNOSIS — E876 Hypokalemia: Secondary | ICD-10-CM | POA: Diagnosis not present

## 2020-06-11 DIAGNOSIS — E871 Hypo-osmolality and hyponatremia: Secondary | ICD-10-CM | POA: Diagnosis not present

## 2020-06-11 DIAGNOSIS — D631 Anemia in chronic kidney disease: Secondary | ICD-10-CM | POA: Diagnosis not present

## 2020-06-11 DIAGNOSIS — D62 Acute posthemorrhagic anemia: Secondary | ICD-10-CM | POA: Diagnosis not present

## 2020-06-11 DIAGNOSIS — Z841 Family history of disorders of kidney and ureter: Secondary | ICD-10-CM

## 2020-06-11 DIAGNOSIS — E860 Dehydration: Secondary | ICD-10-CM | POA: Diagnosis not present

## 2020-06-11 DIAGNOSIS — D509 Iron deficiency anemia, unspecified: Secondary | ICD-10-CM | POA: Diagnosis present

## 2020-06-11 DIAGNOSIS — Z7984 Long term (current) use of oral hypoglycemic drugs: Secondary | ICD-10-CM

## 2020-06-11 DIAGNOSIS — G4733 Obstructive sleep apnea (adult) (pediatric): Secondary | ICD-10-CM | POA: Diagnosis present

## 2020-06-11 DIAGNOSIS — N1831 Chronic kidney disease, stage 3a: Secondary | ICD-10-CM | POA: Diagnosis not present

## 2020-06-11 DIAGNOSIS — M170 Bilateral primary osteoarthritis of knee: Secondary | ICD-10-CM

## 2020-06-11 DIAGNOSIS — J449 Chronic obstructive pulmonary disease, unspecified: Secondary | ICD-10-CM | POA: Diagnosis present

## 2020-06-11 DIAGNOSIS — G8929 Other chronic pain: Secondary | ICD-10-CM

## 2020-06-11 DIAGNOSIS — M1990 Unspecified osteoarthritis, unspecified site: Secondary | ICD-10-CM | POA: Diagnosis not present

## 2020-06-11 DIAGNOSIS — Z20822 Contact with and (suspected) exposure to covid-19: Secondary | ICD-10-CM | POA: Diagnosis not present

## 2020-06-11 DIAGNOSIS — Z79890 Hormone replacement therapy (postmenopausal): Secondary | ICD-10-CM

## 2020-06-11 DIAGNOSIS — Z87891 Personal history of nicotine dependence: Secondary | ICD-10-CM

## 2020-06-11 DIAGNOSIS — I129 Hypertensive chronic kidney disease with stage 1 through stage 4 chronic kidney disease, or unspecified chronic kidney disease: Secondary | ICD-10-CM | POA: Diagnosis not present

## 2020-06-11 DIAGNOSIS — E039 Hypothyroidism, unspecified: Secondary | ICD-10-CM | POA: Diagnosis present

## 2020-06-11 DIAGNOSIS — E785 Hyperlipidemia, unspecified: Secondary | ICD-10-CM | POA: Diagnosis present

## 2020-06-11 DIAGNOSIS — E872 Acidosis: Secondary | ICD-10-CM | POA: Diagnosis present

## 2020-06-11 DIAGNOSIS — Z79899 Other long term (current) drug therapy: Secondary | ICD-10-CM

## 2020-06-11 DIAGNOSIS — I340 Nonrheumatic mitral (valve) insufficiency: Secondary | ICD-10-CM | POA: Diagnosis not present

## 2020-06-11 DIAGNOSIS — E1122 Type 2 diabetes mellitus with diabetic chronic kidney disease: Secondary | ICD-10-CM | POA: Diagnosis not present

## 2020-06-11 DIAGNOSIS — K219 Gastro-esophageal reflux disease without esophagitis: Secondary | ICD-10-CM | POA: Diagnosis present

## 2020-06-11 DIAGNOSIS — Z809 Family history of malignant neoplasm, unspecified: Secondary | ICD-10-CM

## 2020-06-11 DIAGNOSIS — I272 Pulmonary hypertension, unspecified: Secondary | ICD-10-CM | POA: Diagnosis present

## 2020-06-11 DIAGNOSIS — Z8249 Family history of ischemic heart disease and other diseases of the circulatory system: Secondary | ICD-10-CM

## 2020-06-11 DIAGNOSIS — M47816 Spondylosis without myelopathy or radiculopathy, lumbar region: Secondary | ICD-10-CM | POA: Diagnosis present

## 2020-06-11 DIAGNOSIS — H409 Unspecified glaucoma: Secondary | ICD-10-CM | POA: Diagnosis present

## 2020-06-11 DIAGNOSIS — N179 Acute kidney failure, unspecified: Secondary | ICD-10-CM | POA: Diagnosis not present

## 2020-06-11 DIAGNOSIS — N9489 Other specified conditions associated with female genital organs and menstrual cycle: Secondary | ICD-10-CM | POA: Diagnosis present

## 2020-06-11 DIAGNOSIS — E1136 Type 2 diabetes mellitus with diabetic cataract: Secondary | ICD-10-CM | POA: Diagnosis present

## 2020-06-11 DIAGNOSIS — K529 Noninfective gastroenteritis and colitis, unspecified: Secondary | ICD-10-CM | POA: Diagnosis not present

## 2020-06-11 DIAGNOSIS — G7000 Myasthenia gravis without (acute) exacerbation: Secondary | ICD-10-CM | POA: Diagnosis not present

## 2020-06-11 DIAGNOSIS — N200 Calculus of kidney: Secondary | ICD-10-CM | POA: Diagnosis not present

## 2020-06-11 DIAGNOSIS — A0472 Enterocolitis due to Clostridium difficile, not specified as recurrent: Secondary | ICD-10-CM | POA: Diagnosis present

## 2020-06-11 DIAGNOSIS — F418 Other specified anxiety disorders: Secondary | ICD-10-CM | POA: Diagnosis present

## 2020-06-11 DIAGNOSIS — Z794 Long term (current) use of insulin: Secondary | ICD-10-CM

## 2020-06-11 DIAGNOSIS — R197 Diarrhea, unspecified: Secondary | ICD-10-CM | POA: Diagnosis not present

## 2020-06-11 DIAGNOSIS — Z96651 Presence of right artificial knee joint: Secondary | ICD-10-CM | POA: Diagnosis present

## 2020-06-11 DIAGNOSIS — E119 Type 2 diabetes mellitus without complications: Secondary | ICD-10-CM

## 2020-06-11 DIAGNOSIS — Z833 Family history of diabetes mellitus: Secondary | ICD-10-CM

## 2020-06-11 DIAGNOSIS — Z7952 Long term (current) use of systemic steroids: Secondary | ICD-10-CM

## 2020-06-11 MED ORDER — SODIUM CHLORIDE 0.9 % IV BOLUS
500.0000 mL | Freq: Once | INTRAVENOUS | Status: AC
  Administered 2020-06-12: 500 mL via INTRAVENOUS

## 2020-06-11 NOTE — ED Provider Notes (Signed)
[facility] EMERGENCY DEPARTMENT Provider Note   CSN: [id number] Arrival date & time: 06/11/20  2233     History Chief Complaint  Patient presents with  . Diarrhea    [name] is a 79 y.o. female.  Patient presents to the emergency department for evaluation of abdominal pain with diarrhea.  Patient reports that symptoms began approximately 4 weeks ago.  Initially she did see some bright red blood which she thought was from her hemorrhoids.  She has not had any bleeding recently but has had continuous watery diarrhea.  She has been trying OTC meds such as Imodium without improvement.  Patient reports that she has progressively gotten more weak.  She has not had any fevers.  No nausea or vomiting.        Past Medical History:  Diagnosis Date  . Anemia in chronic renal disease 12/30/2015  . Anxiety   . Arthritis    "all over" (01/19/2014)  . Arthritis, lumbar spine   . Carpal tunnel syndrome   . Chronic bronchitis ([place])    "got it q yr for awhile; hasn't had it in awhile" (01/19/2014)  . Chronic kidney disease (CKD) stage G3b/A1, moderately decreased glomerular filtration rate (GFR) between 30-44 mL/min/1.73 square meter and albuminuria creatinine ratio less than 30 mg/g 09/14/2009   Qualifier: Diagnosis of  By: Moshe Cipro MD, Joycelyn Schmid    . Chronic renal disease, stage 3, moderately decreased glomerular filtration rate (GFR) between 30-59 mL/min/1.73 square meter 09/14/2009   Qualifier: Diagnosis of  By: Moshe Cipro MD, Joycelyn Schmid    . Complication of anesthesia    combative  . COPD (chronic obstructive pulmonary disease) ([place])   . Depression   . Diverticulosis 07/2004   Colonscopy Dr Gala Romney  . Esophagitis, erosive 2009  . Exertional shortness of breath   . Gastroesophageal reflux   . TKZSWFUX(323.5)    "usually a couple times/wk" (01/19/2014)  . Heart murmur    saw cardiology In [place], he told her she did not need to come back.  . Hyperlipidemia   . Hypertension   .  Impingement syndrome, shoulder   . Iron deficiency anemia   . Mitral regurgitation   . Myasthenia gravis   . Myasthenia gravis in remission ([place]) 11/24/2014  . Myasthenia gravis in remission ([place])   . Obesity   . OSA on CPAP    Negative on last sleep study  . Pneumonia 01/2012  . Pulmonary HTN ([place])   . Schatzki's ring    Last EGD w/ dilation 02/08/11, 2009 & 2007  . Seasonal allergies   . Shoulder pain   . Thyroid disease    "used to take RX; they took me off it" (01/19/2014)  . Tobacco abuse   . Type II diabetes mellitus [facility])     Patient Active Problem List   Diagnosis Date Noted  . Osteoarthritis of both knees 05/24/2020  . Elevated alkaline phosphatase measurement 10/21/2019  . Back pain 07/05/2019  . Rectal bleeding 01/08/2019  . Constipation 01/08/2019  . Nausea and vomiting 11/03/2018  . Trigger finger, right index finger   . Monoclonal gammopathy of unknown significance (MGUS) 11/26/2017  . Memory loss of unknown cause 11/10/2017  . Chronic left shoulder pain 09/14/2017  . Encounter for pain management 02/09/2017  . Acute maxillary sinusitis 04/11/2016  . Anemia in chronic renal disease 12/30/2015  . Morbid obesity ([place]) 04/18/2015  . Myasthenia gravis in remission ([place]) 11/24/2014  . Myasthenia gravis ([place]) 11/16/2014  . Vitamin D deficiency  05/24/2014  . Osteopenia 04/26/2014  . Macular hole of left eye 01/19/2014  . Generalized osteoarthritis 05/25/2013  . Esophageal dysphagia 04/03/2012  . Abnormal chest CT 03/02/2012  . Insomnia 01/02/2012  . Malignant hypertension 05/30/2011  . Schatzki's ring 05/07/2011  . INGROWN TOENAIL 01/10/2011  . GLAUCOMA 08/08/2010  . DYSCHROMIA, UNSPECIFIED 08/08/2010  . Chronic renal disease, stage 3, moderately decreased glomerular filtration rate (GFR) between 30-59 mL/min/1.73 square meter 09/14/2009  . HIP PAIN, LEFT 06/09/2009  . Urinary incontinence 06/09/2009  . IDA (iron deficiency anemia) 10/02/2008  .  Hypothyroidism 09/27/2008  . Hyperlipemia 06/30/2008  . Depression with anxiety 06/30/2008  . GERD 06/30/2008  . Obstructive sleep apnea 06/30/2008  . CARPAL TUNNEL SYNDROME 05/19/2008  . SHOULDER PAIN 02/02/2008  . IMPINGEMENT SYNDROME 02/02/2008  . DEGENERATIVE JOINT DISEASE, KNEE 11/24/2007  . Diabetes mellitus, insulin dependent (IDDM), controlled 09/09/2007    Past Surgical History:  Procedure Laterality Date  . [place] VITRECTOMY WITH 20 GAUGE MVR PORT Left 01/19/2014   Procedure: 25 GAUGE PARS PLANA VITRECTOMY WITH 20 GAUGE MVR PORT; MEMBRAME PEEL; SERUM PATCH; LASER TREATMENT; C3F8;  Surgeon: Hayden Pedro, MD;  Location: [facility];  Service: Ophthalmology;  Laterality: Left;  . ABDOMINAL HYSTERECTOMY    . AGILE CAPSULE N/A 08/05/2019   Procedure: AGILE CAPSULE;  Surgeon: Daneil Dolin, MD;  [facility];  Service: Endoscopy;  Laterality: N/A;  7:30am  . APPENDECTOMY    . CARPAL TUNNEL RELEASE Bilateral   . CATARACT EXTRACTION W/PHACO Left 10/21/2013   Procedure: LEFT CATARACT EXTRACTION PHACO AND INTRAOCULAR LENS PLACEMENT (IOC);  Surgeon: Marylynn Pearson, MD;  Location: [place];  Service: Ophthalmology;  Laterality: Left;  . CHOLECYSTECTOMY    . COLONOSCOPY  05/08/2012   MEQ:ASTMHDQQ and external hemorrhoids; colonic diverticulosis  . COLONOSCOPY WITH PROPOFOL N/A 02/26/2019   two simple adenomas, diverticulosis,  and internal hemorrhoids.   . ESOPHAGEAL DILATION     "more than 3 times" (01/19/2014)  . ESOPHAGOGASTRODUODENOSCOPY  02/08/11   [place]-Distal esophageal erosion consistent with mild erosive reflux   esophagitis/ Noncritical Schatzki ring, small hiatal hernia otherwise upper/ gastrointestinal tract appeared unremarkable, status post passage  of a Maloney dilation to biopsy disruption of the ring described  . ESOPHAGOGASTRODUODENOSCOPY  04/2012   2 tandem incomplete distal esophagea rings s/p dilation.   . ESOPHAGOGASTRODUODENOSCOPY N/A 09/16/2014   Dr.  Gala Romney: Schatzki ring status post dilation/disruption.  Hiatal hernia.  Marland Kitchen ESOPHAGOGASTRODUODENOSCOPY (EGD) WITH PROPOFOL N/A 12/08/2018   EGD with mild Schatzki ring s/p dilation, small hiatal hernia, otherwise normal  . EYE SURGERY     cataracts, bilateral  . GIVENS CAPSULE STUDY N/A 09/29/2019   Procedure: GIVENS CAPSULE STUDY;  Surgeon: Daneil Dolin, MD;  [facility];  Service: Endoscopy;  Laterality: N/A;  7:30am  . INCONTINENCE SURGERY  08/26/09   [place]  . JOINT REPLACEMENT     right total knee  . MALONEY DILATION N/A 09/16/2014   Procedure: Venia Minks DILATION;  Surgeon: Daneil Dolin, MD;  [facility];  Service: Endoscopy;  Laterality: N/A;  . Venia Minks DILATION N/A 12/08/2018   Procedure: Venia Minks DILATION;  Surgeon: Daneil Dolin, MD;  [facility];  Service: Endoscopy;  Laterality: N/A;  . PARS PLANA VITRECTOMY W/ REPAIR OF MACULAR HOLE Left 01/19/2014  . POLYPECTOMY  02/26/2019   Procedure: POLYPECTOMY;  Surgeon: Danie Binder, MD;  [facility];  Service: Endoscopy;;  ascending colon polyps  x2  . SAVORY DILATION N/A 09/16/2014   Procedure: SAVORY DILATION;  Surgeon: Daneil Dolin, MD;  [facility];  Service: Endoscopy;  Laterality: N/A;  . TONSILLECTOMY    . TOTAL KNEE ARTHROPLASTY Right 05/13/07   Dr. Aline Brochure  . TRIGGER FINGER RELEASE Right 06/26/2018   Procedure: RIGHT INDEX FINGER TRIGGER FINGER/A-1 PULLEY RELEASE;  Surgeon: Carole Civil, MD;  Location: [facility];  Service: Orthopedics;  Laterality: Right;     OB History   No obstetric history on file.     Family History  Problem Relation Age of Onset  . Cancer Mother   . Heart disease Mother   . Cancer Father   . Heart disease Father   . Diabetes Sister   . Hypertension Brother   . Heart disease Brother   . Cancer Sister   . Kidney failure Sister   . Diabetes Son   . Hypertension Son   . Hypertension Daughter   . Colon cancer Neg Hx      Social History   Tobacco Use  . Smoking status: Former Smoker    Packs/day: 0.50    Years: 25.00    Pack years: 12.50    Types: Cigarettes    Quit date: 01/01/2004    Years since quitting: 16.4  . Smokeless tobacco: Never Used  Vaping Use  . Vaping Use: Never used  Substance Use Topics  . Alcohol use: No  . Drug use: No    Home Medications Prior to Admission medications   Medication Sig Start Date End Date Taking? Authorizing Provider  ACETAMINOPHEN EXTRA STRENGTH 500 MG tablet TAKE (1) TABLET BY MOUTH AT BEDTIME. 04/20/20   Fayrene Helper, MD  amLODipine (NORVASC) 10 MG tablet TAKE 1 TABLET BY MOUTH ONCE A DAY. 05/17/20   Fayrene Helper, MD  B-D ULTRAFINE III SHORT PEN 31G X 8 MM MISC USE ONCE DAILY WITH LANTUS SOLOSTAR PEN. 01/21/20   Fayrene Helper, MD  brimonidine-timolol (COMBIGAN) 0.2-0.5 % ophthalmic solution Place 1 drop into both eyes every 12 (twelve) hours.    [provider]  cetirizine (ZYRTEC) 10 MG tablet TAKE 1 TABLET BY MOUTH ONCE A DAY. 05/17/20   Fayrene Helper, MD  Cholecalciferol (VITAMIN D3) 2000 units TABS Take 2,000 Units by mouth at bedtime.    [provider]  citalopram (CELEXA) 10 MG tablet TAKE 1 TABLET BY MOUTH ONCE A DAY. 04/14/20   Fayrene Helper, MD  [facility] Liver Oil CAPS Take by mouth. 500 MG per patient    [provider]  Cranberry 1000 MG CAPS Take 1,000 mg by mouth daily.    [provider]  cycloSPORINE (RESTASIS) 0.05 % ophthalmic emulsion Place 1 drop into both eyes 2 (two) times daily.     [provider]  diltiazem (CARDIZEM CD) 240 MG 24 hr capsule TAKE (1) CAPSULE BY MOUTH TWICE DAILY. 05/17/20   Fayrene Helper, MD  doxycycline (VIBRAMYCIN) 100 MG capsule Take 1 capsule (100 mg total) by mouth 2 (two) times daily. 04/29/20   Hayden Rasmussen, MD  ezetimibe (ZETIA) 10 MG tablet TAKE 1 TABLET BY MOUTH ONCE A DAY. 05/17/20   Fayrene Helper, MD  fluticasone (FLONASE) 50  MCG/ACT nasal spray PLACE 2 SPRAYS INTO BOTH NOSTRILS DAILY. 04/20/20   Fayrene Helper, MD  gabapentin (NEURONTIN) 300 MG capsule TAKE (1) CAPSULE BY MOUTH TWICE DAILY. 05/17/20   Fayrene Helper, MD  hydrALAZINE (APRESOLINE) 50 MG  tablet TAKE 1 TABLET BY MOUTH THREE TIMES A DAY. 05/17/20   Fayrene Helper, MD  HYDROcodone-acetaminophen (NORCO) 5-325 MG tablet Take 1 tablet by mouth 2 (two) times daily. 05/20/20   Perlie Mayo, NP  Lancets [facility] DELICA PLUS XIPJAS50N) MISC USE AS DIRECTED 3 TIMES DAILY FOR BLOOD GLUCOSE TESTING. 01/21/20   Fayrene Helper, MD  LANTUS SOLOSTAR 100 UNIT/ML Solostar Pen INJECT 30 UNITS SUBCUTANEOUSLY ONCE DAILY. 11/23/19   Fayrene Helper, MD  linaclotide [facility]) 72 MCG capsule Take 1 capsule (72 mcg total) by mouth daily before breakfast. 12/16/19   Annitta Needs, NP  losartan (COZAAR) 100 MG tablet TAKE (1) TABLET BY MOUTH DAILY. 05/17/20   Fayrene Helper, MD  Multiple Vitamin (MULTIVITAMIN WITH MINERALS) TABS tablet Take 1 tablet by mouth at bedtime.    [provider]  ondansetron (ZOFRAN) 4 MG tablet Take 1 tablet (4 mg total) by mouth every 8 (eight) hours as needed for nausea or vomiting. 04/29/20   Hayden Rasmussen, MD  ondansetron (ZOFRAN-ODT) 8 MG disintegrating tablet Take 1 tablet (8 mg total) by mouth every 8 (eight) hours as needed for nausea or vomiting. 09/23/19   Fayrene Helper, MD  [facility] VERIO test strip USE AS DIRECTED TO TEST BLOOD GLUCOSE 3 TIMES DAILY. 04/22/20   Fayrene Helper, MD  pantoprazole (PROTONIX) 40 MG tablet TAKE 1 TABLET BY MOUTH ONCE A DAY, 30 MINUTES BEFORE BREAKFAST. 01/21/20   Mahala Menghini, PA-C  predniSONE (DELTASONE) 5 MG tablet Take 1 tablet (5 mg total) by mouth daily. 06/01/19   Carole Civil, MD  PROAIR HFA [phone] Base) MCG/ACT inhaler INHALE 2 PUFFS BY MOUTH EVERY 6 HOURS AS NEEDED . 11/23/19   Fayrene Helper, MD  pyridostigmine (MESTINON) 60 MG tablet TAKE (1)  TABLET BY MOUTH THREE TIMES A DAY. 05/17/20   Fayrene Helper, MD  rosuvastatin (CRESTOR) 40 MG tablet TAKE (1) TABLET BY MOUTH AT BEDTIME. 03/18/20   Fayrene Helper, MD  solifenacin (VESICARE) 10 MG tablet TAKE 1 TABLET BY MOUTH ONCE A DAY. 03/21/20   Perlie Mayo, NP  spironolactone (ALDACTONE) 25 MG tablet TAKE 1 TABLET BY MOUTH ONCE A DAY. 04/14/20   Fayrene Helper, MD  TRADJENTA 5 MG TABS tablet TAKE 1 TABLET BY MOUTH ONCE A DAY. 05/17/20   Fayrene Helper, MD  UNABLE TO FIND Diabetic shoes x 1 Inserts x 3 DX E11.9 07/21/18   Fayrene Helper, MD  venlafaxine XR (EFFEXOR-XR) 150 MG 24 hr capsule TAKE 1 CAPSULE BY MOUTH DAILY. 05/17/20   Fayrene Helper, MD    Allergies    Patient has no known allergies.  Review of Systems   Review of Systems  Constitutional: Positive for fatigue.  Gastrointestinal: Positive for diarrhea. Negative for nausea and vomiting.  All other systems reviewed and are negative.   Physical Exam Updated Vital Signs BP (!) 133/41 (BP Location: Right Arm)   Pulse 77   Temp 97.9 F (36.6 C) (Oral)   Resp 16   Ht 5\' 6"  (1.676 m)   Wt 106.6 kg   SpO2 98%   BMI 37.93 kg/m   Physical Exam Vitals and nursing note reviewed.  Constitutional:      General: She is not in acute distress.    Appearance: Normal appearance. She is well-developed.  HENT:     Head: Normocephalic and atraumatic.     Right Ear: Hearing normal.  Left Ear: Hearing normal.     Nose: Nose normal.  Eyes:     Conjunctiva/sclera: Conjunctivae normal.     Pupils: Pupils are equal, round, and reactive to light.  Cardiovascular:     Rate and Rhythm: Regular rhythm.     Heart sounds: S1 normal and S2 normal. No murmur heard.  No friction rub. No gallop.   Pulmonary:     Effort: Pulmonary effort is normal. No respiratory distress.     Breath sounds: Normal breath sounds.  Chest:     Chest wall: No tenderness.  Abdominal:     General: Bowel sounds are normal.      Palpations: Abdomen is soft.     Tenderness: There is abdominal tenderness in the left lower quadrant. There is no guarding or rebound. Negative signs include Murphy's sign and McBurney's sign.     Hernia: No hernia is present.  Musculoskeletal:        General: Normal range of motion.     Cervical back: Normal range of motion and neck supple.  Skin:    General: Skin is warm and dry.     Findings: No rash.  Neurological:     Mental Status: She is alert and oriented to person, place, and time.     GCS: GCS eye subscore is 4. GCS verbal subscore is 5. GCS motor subscore is 6.     Cranial Nerves: No cranial nerve deficit.     Sensory: No sensory deficit.     Coordination: Coordination normal.  Psychiatric:        Speech: Speech normal.        Behavior: Behavior normal.        Thought Content: Thought content normal.     ED Results / Procedures / Treatments   Labs (all labs ordered are listed, but only abnormal results are displayed) Labs Reviewed  CBC WITH DIFFERENTIAL/PLATELET - Abnormal; Notable for the following components:      Result Value   WBC 17.7 (*)    RBC 3.42 (*)    Hemoglobin 9.1 (*)    HCT 29.0 (*)    RDW 17.6 (*)    Platelets 560 (*)    Neutro Abs 14.7 (*)    Monocytes Absolute 1.4 (*)    Abs Immature Granulocytes 0.38 (*)    All other components within normal limits  COMPREHENSIVE METABOLIC PANEL - Abnormal; Notable for the following components:   Potassium 3.2 (*)    Glucose, Bld 250 (*)    BUN 35 (*)    Creatinine, Ser 1.68 (*)    Calcium 8.2 (*)    Total Protein 6.4 (*)    Albumin 2.3 (*)    Alkaline Phosphatase 129 (*)    GFR calc non Af Amer 29 (*)    GFR calc Af Amer 34 (*)    All other components within normal limits  GASTROINTESTINAL PANEL BY PCR, STOOL (REPLACES STOOL CULTURE)  C DIFFICILE QUICK SCREEN W PCR REFLEX  SARS CORONAVIRUS 2 BY RT PCR (HOSPITAL ORDER, [facility])  LIPASE, BLOOD  URINALYSIS, ROUTINE W  REFLEX MICROSCOPIC    EKG None  Radiology CT ABDOMEN PELVIS WO CONTRAST  Result Date: 06/12/2020 CLINICAL DATA:  Left lower quadrant abdominal pain. EXAM: CT ABDOMEN AND PELVIS WITHOUT CONTRAST TECHNIQUE: Multidetector CT imaging of the abdomen and pelvis was performed following the standard protocol without IV contrast. COMPARISON:  None. FINDINGS: Lower chest: The lung bases are clear. The  heart size is normal. Hepatobiliary: The liver is normal. Status post cholecystectomy.There is no biliary ductal dilation. Pancreas: Normal contours without ductal dilatation. No peripancreatic fluid collection. Spleen: Unremarkable. Adrenals/Urinary Tract: --Adrenal glands: Unremarkable. --Right kidney/ureter: There is a nonobstructing stone in the upper pole the right kidney. --Left kidney/ureter: No hydronephrosis or radiopaque kidney stones. --Urinary bladder: Unremarkable. Stomach/Bowel: --Stomach/Duodenum: There is a large hiatal hernia. --Small bowel: Unremarkable. --Colon: There is diffuse circumferential wall thickening of much of the descending colon and sigmoid colon. 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 There are enlarged adjacent regional lymph nodes. There is a moderate amount of stool in the remaining portions of the colon. There may be a single inflamed diverticulum in the ascending colon (axial series 2, image 37). --Appendix: Not visualized. No right lower quadrant inflammation or free fluid. Vascular/Lymphatic: Atherosclerotic calcification is present within the non-aneurysmal abdominal aorta, without hemodynamically significant stenosis. --No retroperitoneal lymphadenopathy. --there are mildly enlarged mesenteric lymph nodes. There are multiple enlarged regional lymph nodes along the course of the descending colon. --No pelvic or inguinal lymphadenopathy. Reproductive: There is a complex cystic mass in the right hemipelvis likely associated with the right ovary measuring 3.9 x 2.6 cm (axial series  2, image 69). There is a small cystic structure involving the left ovary measuring approximately 1.8 cm. The patient is status post prior hysterectomy. Other: No ascites or free air. The abdominal wall is normal. Musculoskeletal. No acute displaced fractures. IMPRESSION: 1. Diffuse circumferential wall thickening of much of the descending colon and sigmoid colon with multiple enlarged adjacent regional lymph nodes. Findings are concerning for an infectious or inflammatory colitis. Correlation with patient's symptoms and laboratory studies is recommended. As an underlying mass cannot be excluded, a follow-up colonoscopy is recommended as an outpatient. 2. Complex cystic mass in the right hemipelvis likely associated with the right ovary measuring up to 3.9 cm. Follow-up with a nonemergent outpatient pelvic ultrasound is recommended. 3. Large hiatal hernia. 4. Nonobstructive right nephrolithiasis. Aortic Atherosclerosis (ICD10-I70.0). Electronically Signed   By: Constance Holster M.D.   On: 06/12/2020 04:00    Procedures Procedures (including critical care time)  Medications Ordered in ED Medications  ciprofloxacin (CIPRO) IVPB 400 mg (has no administration in time range)  metroNIDAZOLE (FLAGYL) IVPB 500 mg (has no administration in time range)  sodium chloride 0.9 % bolus 500 mL (0 mLs Intravenous Stopped 06/12/20 0044)  ondansetron (ZOFRAN) injection 4 mg (4 mg Intravenous Given 06/12/20 0024)    ED Course  I have reviewed the triage vital signs and the nursing notes.  Pertinent labs & imaging results that were available during my care of the patient were reviewed by me and considered in my medical decision making (see chart for details).    MDM Rules/Calculators/A&P                          Patient presents to the emergency department for evaluation of 4 weeks of progressively worsening diarrhea and abdominal pain.  Pain is predominantly left lower quadrant.  No signs of peritonitis on  examination.  Patient reports that she has become progressively more and more weak.  She does have some mild anemia below her baseline.  She has mild hypokalemia and an acute kidney injury all explained by her chronic loss with diarrhea and intermittent rectal bleeding.  She does not require transfusion but will require hospitalization for initiation of treatment of diffuse colitis seen on CT scan.  Final Clinical Impression(s) /  ED Diagnoses Final diagnoses:  Colitis  AKI (acute kidney injury) ([place])  Hypokalemia    Rx / DC Orders ED Discharge Orders    None       Neeva Trew, Gwenyth Allegra, MD 06/12/20 [phone]

## 2020-06-11 NOTE — ED Triage Notes (Addendum)
Pt with diarrhea for 4 weeks.  Denies any pain at this time.  Pt states she has gotten weaker.

## 2020-06-12 ENCOUNTER — Emergency Department (HOSPITAL_COMMUNITY): Payer: Medicare Other

## 2020-06-12 ENCOUNTER — Encounter (HOSPITAL_COMMUNITY): Payer: Self-pay | Admitting: Internal Medicine

## 2020-06-12 DIAGNOSIS — E1136 Type 2 diabetes mellitus with diabetic cataract: Secondary | ICD-10-CM | POA: Diagnosis present

## 2020-06-12 DIAGNOSIS — E039 Hypothyroidism, unspecified: Secondary | ICD-10-CM | POA: Diagnosis not present

## 2020-06-12 DIAGNOSIS — I272 Pulmonary hypertension, unspecified: Secondary | ICD-10-CM | POA: Diagnosis present

## 2020-06-12 DIAGNOSIS — I129 Hypertensive chronic kidney disease with stage 1 through stage 4 chronic kidney disease, or unspecified chronic kidney disease: Secondary | ICD-10-CM | POA: Diagnosis present

## 2020-06-12 DIAGNOSIS — M6281 Muscle weakness (generalized): Secondary | ICD-10-CM | POA: Diagnosis not present

## 2020-06-12 DIAGNOSIS — K2100 Gastro-esophageal reflux disease with esophagitis, without bleeding: Secondary | ICD-10-CM | POA: Diagnosis not present

## 2020-06-12 DIAGNOSIS — K222 Esophageal obstruction: Secondary | ICD-10-CM | POA: Diagnosis not present

## 2020-06-12 DIAGNOSIS — R1909 Other intra-abdominal and pelvic swelling, mass and lump: Secondary | ICD-10-CM | POA: Diagnosis not present

## 2020-06-12 DIAGNOSIS — D509 Iron deficiency anemia, unspecified: Secondary | ICD-10-CM | POA: Diagnosis not present

## 2020-06-12 DIAGNOSIS — G4733 Obstructive sleep apnea (adult) (pediatric): Secondary | ICD-10-CM | POA: Diagnosis not present

## 2020-06-12 DIAGNOSIS — K5790 Diverticulosis of intestine, part unspecified, without perforation or abscess without bleeding: Secondary | ICD-10-CM | POA: Diagnosis not present

## 2020-06-12 DIAGNOSIS — A0472 Enterocolitis due to Clostridium difficile, not specified as recurrent: Secondary | ICD-10-CM | POA: Diagnosis not present

## 2020-06-12 DIAGNOSIS — N179 Acute kidney failure, unspecified: Secondary | ICD-10-CM | POA: Diagnosis present

## 2020-06-12 DIAGNOSIS — Z9181 History of falling: Secondary | ICD-10-CM | POA: Diagnosis not present

## 2020-06-12 DIAGNOSIS — E860 Dehydration: Secondary | ICD-10-CM | POA: Diagnosis present

## 2020-06-12 DIAGNOSIS — N1831 Chronic kidney disease, stage 3a: Secondary | ICD-10-CM | POA: Diagnosis present

## 2020-06-12 DIAGNOSIS — R262 Difficulty in walking, not elsewhere classified: Secondary | ICD-10-CM | POA: Diagnosis not present

## 2020-06-12 DIAGNOSIS — E872 Acidosis: Secondary | ICD-10-CM | POA: Diagnosis present

## 2020-06-12 DIAGNOSIS — E876 Hypokalemia: Secondary | ICD-10-CM | POA: Diagnosis present

## 2020-06-12 DIAGNOSIS — E871 Hypo-osmolality and hyponatremia: Secondary | ICD-10-CM | POA: Diagnosis not present

## 2020-06-12 DIAGNOSIS — K529 Noninfective gastroenteritis and colitis, unspecified: Secondary | ICD-10-CM | POA: Diagnosis not present

## 2020-06-12 DIAGNOSIS — Z20822 Contact with and (suspected) exposure to covid-19: Secondary | ICD-10-CM | POA: Diagnosis present

## 2020-06-12 DIAGNOSIS — J449 Chronic obstructive pulmonary disease, unspecified: Secondary | ICD-10-CM | POA: Diagnosis not present

## 2020-06-12 DIAGNOSIS — M1990 Unspecified osteoarthritis, unspecified site: Secondary | ICD-10-CM | POA: Diagnosis present

## 2020-06-12 DIAGNOSIS — E1129 Type 2 diabetes mellitus with other diabetic kidney complication: Secondary | ICD-10-CM | POA: Diagnosis not present

## 2020-06-12 DIAGNOSIS — K219 Gastro-esophageal reflux disease without esophagitis: Secondary | ICD-10-CM | POA: Diagnosis present

## 2020-06-12 DIAGNOSIS — D62 Acute posthemorrhagic anemia: Secondary | ICD-10-CM | POA: Diagnosis present

## 2020-06-12 DIAGNOSIS — Z741 Need for assistance with personal care: Secondary | ICD-10-CM | POA: Diagnosis not present

## 2020-06-12 DIAGNOSIS — M47816 Spondylosis without myelopathy or radiculopathy, lumbar region: Secondary | ICD-10-CM | POA: Diagnosis present

## 2020-06-12 DIAGNOSIS — I340 Nonrheumatic mitral (valve) insufficiency: Secondary | ICD-10-CM | POA: Diagnosis present

## 2020-06-12 DIAGNOSIS — N183 Chronic kidney disease, stage 3 unspecified: Secondary | ICD-10-CM | POA: Diagnosis not present

## 2020-06-12 DIAGNOSIS — E1122 Type 2 diabetes mellitus with diabetic chronic kidney disease: Secondary | ICD-10-CM | POA: Diagnosis present

## 2020-06-12 DIAGNOSIS — D631 Anemia in chronic kidney disease: Secondary | ICD-10-CM | POA: Diagnosis present

## 2020-06-12 DIAGNOSIS — G7000 Myasthenia gravis without (acute) exacerbation: Secondary | ICD-10-CM | POA: Diagnosis not present

## 2020-06-12 DIAGNOSIS — E785 Hyperlipidemia, unspecified: Secondary | ICD-10-CM | POA: Diagnosis not present

## 2020-06-12 DIAGNOSIS — N200 Calculus of kidney: Secondary | ICD-10-CM | POA: Diagnosis not present

## 2020-06-12 DIAGNOSIS — H409 Unspecified glaucoma: Secondary | ICD-10-CM | POA: Diagnosis not present

## 2020-06-12 DIAGNOSIS — Z96651 Presence of right artificial knee joint: Secondary | ICD-10-CM | POA: Diagnosis not present

## 2020-06-12 LAB — DIFFERENTIAL
Abs Immature Granulocytes: 0.39 10*3/uL — ABNORMAL HIGH (ref 0.00–0.07)
Basophils Absolute: 0.1 10*3/uL (ref 0.0–0.1)
Basophils Relative: 1 %
Eosinophils Absolute: 0.2 10*3/uL (ref 0.0–0.5)
Eosinophils Relative: 1 %
Immature Granulocytes: 2 %
Lymphocytes Relative: 5 %
Lymphs Abs: 0.8 10*3/uL (ref 0.7–4.0)
Monocytes Absolute: 1.5 10*3/uL — ABNORMAL HIGH (ref 0.1–1.0)
Monocytes Relative: 8 %
Neutro Abs: 15 10*3/uL — ABNORMAL HIGH (ref 1.7–7.7)
Neutrophils Relative %: 83 %

## 2020-06-12 LAB — CBC
HCT: 29.9 % — ABNORMAL LOW (ref 36.0–46.0)
Hemoglobin: 9.2 g/dL — ABNORMAL LOW (ref 12.0–15.0)
MCH: 27.4 pg (ref 26.0–34.0)
MCHC: 30.8 g/dL (ref 30.0–36.0)
MCV: 89 fL (ref 80.0–100.0)
Platelets: 576 10*3/uL — ABNORMAL HIGH (ref 150–400)
RBC: 3.36 MIL/uL — ABNORMAL LOW (ref 3.87–5.11)
RDW: 18.3 % — ABNORMAL HIGH (ref 11.5–15.5)
WBC: 18 10*3/uL — ABNORMAL HIGH (ref 4.0–10.5)
nRBC: 0.1 % (ref 0.0–0.2)

## 2020-06-12 LAB — CBC WITH DIFFERENTIAL/PLATELET
Abs Immature Granulocytes: 0.38 10*3/uL — ABNORMAL HIGH (ref 0.00–0.07)
Basophils Absolute: 0.1 10*3/uL (ref 0.0–0.1)
Basophils Relative: 1 %
Eosinophils Absolute: 0.2 10*3/uL (ref 0.0–0.5)
Eosinophils Relative: 1 %
HCT: 29 % — ABNORMAL LOW (ref 36.0–46.0)
Hemoglobin: 9.1 g/dL — ABNORMAL LOW (ref 12.0–15.0)
Immature Granulocytes: 2 %
Lymphocytes Relative: 5 %
Lymphs Abs: 0.9 10*3/uL (ref 0.7–4.0)
MCH: 26.6 pg (ref 26.0–34.0)
MCHC: 31.4 g/dL (ref 30.0–36.0)
MCV: 84.8 fL (ref 80.0–100.0)
Monocytes Absolute: 1.4 10*3/uL — ABNORMAL HIGH (ref 0.1–1.0)
Monocytes Relative: 8 %
Neutro Abs: 14.7 10*3/uL — ABNORMAL HIGH (ref 1.7–7.7)
Neutrophils Relative %: 83 %
Platelets: 560 10*3/uL — ABNORMAL HIGH (ref 150–400)
RBC: 3.42 MIL/uL — ABNORMAL LOW (ref 3.87–5.11)
RDW: 17.6 % — ABNORMAL HIGH (ref 11.5–15.5)
WBC: 17.7 10*3/uL — ABNORMAL HIGH (ref 4.0–10.5)
nRBC: 0 % (ref 0.0–0.2)

## 2020-06-12 LAB — BASIC METABOLIC PANEL
Anion gap: 12 (ref 5–15)
BUN: 32 mg/dL — ABNORMAL HIGH (ref 8–23)
CO2: 16 mmol/L — ABNORMAL LOW (ref 22–32)
Calcium: 7.8 mg/dL — ABNORMAL LOW (ref 8.9–10.3)
Chloride: 104 mmol/L (ref 98–111)
Creatinine, Ser: 1.41 mg/dL — ABNORMAL HIGH (ref 0.44–1.00)
GFR calc Af Amer: 42 mL/min — ABNORMAL LOW (ref >60)
GFR calc non Af Amer: 36 mL/min — ABNORMAL LOW (ref >60)
Glucose, Bld: 114 mg/dL — ABNORMAL HIGH (ref 70–99)
Potassium: 3.7 mmol/L (ref 3.5–5.1)
Sodium: 132 mmol/L — ABNORMAL LOW (ref 135–145)

## 2020-06-12 LAB — COMPREHENSIVE METABOLIC PANEL
ALT: 15 U/L (ref 0–44)
AST: 18 U/L (ref 15–41)
Albumin: 2.3 g/dL — ABNORMAL LOW (ref 3.5–5.0)
Alkaline Phosphatase: 129 U/L — ABNORMAL HIGH (ref 38–126)
Anion gap: 13 (ref 5–15)
BUN: 35 mg/dL — ABNORMAL HIGH (ref 8–23)
CO2: 24 mmol/L (ref 22–32)
Calcium: 8.2 mg/dL — ABNORMAL LOW (ref 8.9–10.3)
Chloride: 98 mmol/L (ref 98–111)
Creatinine, Ser: 1.68 mg/dL — ABNORMAL HIGH (ref 0.44–1.00)
GFR calc Af Amer: 34 mL/min — ABNORMAL LOW (ref >60)
GFR calc non Af Amer: 29 mL/min — ABNORMAL LOW (ref >60)
Glucose, Bld: 250 mg/dL — ABNORMAL HIGH (ref 70–99)
Potassium: 3.2 mmol/L — ABNORMAL LOW (ref 3.5–5.1)
Sodium: 135 mmol/L (ref 135–145)
Total Bilirubin: 0.8 mg/dL (ref 0.3–1.2)
Total Protein: 6.4 g/dL — ABNORMAL LOW (ref 6.5–8.1)

## 2020-06-12 LAB — URINALYSIS, ROUTINE W REFLEX MICROSCOPIC
Bilirubin Urine: NEGATIVE
Glucose, UA: NEGATIVE mg/dL
Hgb urine dipstick: NEGATIVE
Ketones, ur: NEGATIVE mg/dL
Leukocytes,Ua: NEGATIVE
Nitrite: NEGATIVE
Protein, ur: NEGATIVE mg/dL
Specific Gravity, Urine: 1.013 (ref 1.005–1.030)
pH: 5 (ref 5.0–8.0)

## 2020-06-12 LAB — CBG MONITORING, ED
Glucose-Capillary: 131 mg/dL — ABNORMAL HIGH (ref 70–99)
Glucose-Capillary: 54 mg/dL — ABNORMAL LOW (ref 70–99)
Glucose-Capillary: 60 mg/dL — ABNORMAL LOW (ref 70–99)
Glucose-Capillary: 88 mg/dL (ref 70–99)

## 2020-06-12 LAB — MAGNESIUM: Magnesium: 1.5 mg/dL — ABNORMAL LOW (ref 1.7–2.4)

## 2020-06-12 LAB — PHOSPHORUS: Phosphorus: 3 mg/dL (ref 2.5–4.6)

## 2020-06-12 LAB — LIPASE, BLOOD: Lipase: 14 U/L (ref 11–51)

## 2020-06-12 LAB — HEMOGLOBIN A1C
Hgb A1c MFr Bld: 6.9 % — ABNORMAL HIGH (ref 4.8–5.6)
Mean Plasma Glucose: 151.33 mg/dL

## 2020-06-12 LAB — GLUCOSE, CAPILLARY: Glucose-Capillary: 196 mg/dL — ABNORMAL HIGH (ref 70–99)

## 2020-06-12 LAB — SARS CORONAVIRUS 2 BY RT PCR (HOSPITAL ORDER, PERFORMED IN ~~LOC~~ HOSPITAL LAB): SARS Coronavirus 2: NEGATIVE

## 2020-06-12 MED ORDER — PREDNISONE 10 MG PO TABS
5.0000 mg | ORAL_TABLET | Freq: Every day | ORAL | Status: DC
  Administered 2020-06-13 – 2020-06-20 (×8): 5 mg via ORAL
  Filled 2020-06-12 (×8): qty 1

## 2020-06-12 MED ORDER — SODIUM CHLORIDE 0.9 % IV BOLUS
1000.0000 mL | Freq: Once | INTRAVENOUS | Status: AC
  Administered 2020-06-12: 1000 mL via INTRAVENOUS

## 2020-06-12 MED ORDER — PANTOPRAZOLE SODIUM 40 MG PO TBEC
40.0000 mg | DELAYED_RELEASE_TABLET | Freq: Every day | ORAL | Status: DC
  Administered 2020-06-13 – 2020-06-20 (×8): 40 mg via ORAL
  Filled 2020-06-12 (×8): qty 1

## 2020-06-12 MED ORDER — SODIUM BICARBONATE 8.4 % IV SOLN
INTRAVENOUS | Status: DC
  Filled 2020-06-12 (×12): qty 1000

## 2020-06-12 MED ORDER — METRONIDAZOLE IN NACL 5-0.79 MG/ML-% IV SOLN
500.0000 mg | Freq: Once | INTRAVENOUS | Status: AC
  Administered 2020-06-12: 500 mg via INTRAVENOUS
  Filled 2020-06-12: qty 100

## 2020-06-12 MED ORDER — METRONIDAZOLE IN NACL 5-0.79 MG/ML-% IV SOLN: 500.0000 mg | Freq: Three times a day (TID) | INTRAVENOUS | Status: DC

## 2020-06-12 MED ORDER — MAGNESIUM SULFATE 2 GM/50ML IV SOLN
2.0000 g | Freq: Once | INTRAVENOUS | Status: AC
  Administered 2020-06-12: 2 g via INTRAVENOUS
  Filled 2020-06-12: qty 50

## 2020-06-12 MED ORDER — POTASSIUM CHLORIDE IN NACL 20-0.9 MEQ/L-% IV SOLN
INTRAVENOUS | Status: DC
  Filled 2020-06-12: qty 1000

## 2020-06-12 MED ORDER — DARIFENACIN HYDROBROMIDE ER 7.5 MG PO TB24
15.0000 mg | ORAL_TABLET | Freq: Every day | ORAL | Status: DC
  Administered 2020-06-13 – 2020-06-20 (×8): 15 mg via ORAL
  Filled 2020-06-12: qty 1
  Filled 2020-06-12 (×3): qty 2
  Filled 2020-06-12: qty 1
  Filled 2020-06-12 (×3): qty 2
  Filled 2020-06-12: qty 1
  Filled 2020-06-12 (×2): qty 2

## 2020-06-12 MED ORDER — ROSUVASTATIN CALCIUM 20 MG PO TABS
40.0000 mg | ORAL_TABLET | Freq: Every day | ORAL | Status: DC
  Administered 2020-06-13 – 2020-06-20 (×6): 40 mg via ORAL
  Filled 2020-06-12 (×8): qty 2

## 2020-06-12 MED ORDER — CYCLOSPORINE 0.05 % OP EMUL
1.0000 [drp] | Freq: Two times a day (BID) | OPHTHALMIC | Status: DC
  Administered 2020-06-12 – 2020-06-20 (×15): 1 [drp] via OPHTHALMIC
  Filled 2020-06-12 (×16): qty 1

## 2020-06-12 MED ORDER — CITALOPRAM HYDROBROMIDE 10 MG PO TABS
10.0000 mg | ORAL_TABLET | Freq: Every day | ORAL | Status: DC
  Administered 2020-06-13 – 2020-06-20 (×8): 10 mg via ORAL
  Filled 2020-06-12 (×9): qty 1

## 2020-06-12 MED ORDER — ONDANSETRON HCL 4 MG/2ML IJ SOLN
4.0000 mg | Freq: Once | INTRAMUSCULAR | Status: AC
  Administered 2020-06-12: 4 mg via INTRAVENOUS
  Filled 2020-06-12: qty 2

## 2020-06-12 MED ORDER — ACETAMINOPHEN 650 MG RE SUPP: 650.0000 mg | Freq: Four times a day (QID) | RECTAL | Status: DC | PRN

## 2020-06-12 MED ORDER — VENLAFAXINE HCL ER 75 MG PO CP24
150.0000 mg | ORAL_CAPSULE | Freq: Every day | ORAL | Status: DC
  Administered 2020-06-13 – 2020-06-20 (×8): 150 mg via ORAL
  Filled 2020-06-12 (×8): qty 2

## 2020-06-12 MED ORDER — INSULIN ASPART 100 UNIT/ML ~~LOC~~ SOLN
0.0000 [IU] | Freq: Three times a day (TID) | SUBCUTANEOUS | Status: DC
  Administered 2020-06-12: 4 [IU] via SUBCUTANEOUS
  Administered 2020-06-12: 3 [IU] via SUBCUTANEOUS
  Administered 2020-06-13 – 2020-06-14 (×2): 4 [IU] via SUBCUTANEOUS
  Filled 2020-06-12 (×2): qty 1

## 2020-06-12 MED ORDER — HYDROCODONE-ACETAMINOPHEN 5-325 MG PO TABS
1.0000 | ORAL_TABLET | Freq: Four times a day (QID) | ORAL | Status: DC | PRN
  Administered 2020-06-12 – 2020-06-20 (×13): 1 via ORAL
  Filled 2020-06-12 (×14): qty 1

## 2020-06-12 MED ORDER — CIPROFLOXACIN IN D5W 400 MG/200ML IV SOLN
400.0000 mg | Freq: Once | INTRAVENOUS | Status: AC
  Administered 2020-06-12: 400 mg via INTRAVENOUS
  Filled 2020-06-12: qty 200

## 2020-06-12 MED ORDER — EZETIMIBE 10 MG PO TABS
10.0000 mg | ORAL_TABLET | Freq: Every day | ORAL | Status: DC
  Administered 2020-06-13 – 2020-06-20 (×8): 10 mg via ORAL
  Filled 2020-06-12 (×8): qty 1

## 2020-06-12 MED ORDER — PYRIDOSTIGMINE BROMIDE 60 MG PO TABS
60.0000 mg | ORAL_TABLET | Freq: Three times a day (TID) | ORAL | Status: DC
  Administered 2020-06-12 – 2020-06-20 (×24): 60 mg via ORAL
  Filled 2020-06-12 (×24): qty 1

## 2020-06-12 MED ORDER — BRIMONIDINE TARTRATE-TIMOLOL 0.2-0.5 % OP SOLN
1.0000 [drp] | Freq: Two times a day (BID) | OPHTHALMIC | Status: DC
  Filled 2020-06-12: qty 5

## 2020-06-12 MED ORDER — POTASSIUM CHLORIDE 10 MEQ/100ML IV SOLN
10.0000 meq | INTRAVENOUS | Status: AC
  Administered 2020-06-12: 10 meq via INTRAVENOUS
  Filled 2020-06-12 (×2): qty 100

## 2020-06-12 MED ORDER — METRONIDAZOLE IN NACL 5-0.79 MG/ML-% IV SOLN
500.0000 mg | Freq: Three times a day (TID) | INTRAVENOUS | Status: DC
  Administered 2020-06-12 – 2020-06-16 (×12): 500 mg via INTRAVENOUS
  Filled 2020-06-12 (×12): qty 100

## 2020-06-12 MED ORDER — ROSUVASTATIN CALCIUM 40 MG PO TABS: 40.0000 mg | ORAL_TABLET | Freq: Every day | ORAL | Status: DC

## 2020-06-12 MED ORDER — PROCHLORPERAZINE EDISYLATE 10 MG/2ML IJ SOLN
5.0000 mg | INTRAMUSCULAR | Status: DC | PRN
  Administered 2020-06-12: 5 mg via INTRAVENOUS
  Filled 2020-06-12: qty 2

## 2020-06-12 MED ORDER — GABAPENTIN 300 MG PO CAPS
300.0000 mg | ORAL_CAPSULE | Freq: Two times a day (BID) | ORAL | Status: DC
  Administered 2020-06-12 – 2020-06-20 (×16): 300 mg via ORAL
  Filled 2020-06-12 (×16): qty 1

## 2020-06-12 MED ORDER — ACETAMINOPHEN 325 MG PO TABS
650.0000 mg | ORAL_TABLET | Freq: Four times a day (QID) | ORAL | Status: DC | PRN
  Administered 2020-06-14: 650 mg via ORAL
  Filled 2020-06-12: qty 2

## 2020-06-12 MED ORDER — CIPROFLOXACIN IN D5W 400 MG/200ML IV SOLN
400.0000 mg | Freq: Two times a day (BID) | INTRAVENOUS | Status: DC
  Administered 2020-06-12 – 2020-06-16 (×8): 400 mg via INTRAVENOUS
  Filled 2020-06-12 (×8): qty 200

## 2020-06-12 NOTE — ED Notes (Signed)
Patient transported to CT 

## 2020-06-12 NOTE — Progress Notes (Signed)
Patient admitted to the hospital earlier this morning by Dr. Olevia Bowens.  Patient seen and examined.  She continues to have abdominal pain, nausea, vomiting and diarrhea.  Abdomen is soft, diffusely tender.  Assessment/plan:  1. Acute colitis.  Started on intravenous antibiotics with ciprofloxacin and Flagyl.  Will need outpatient GI follow-up for colonoscopy once acute issues have resolved.  Continue on liquid diet for now.  GI pathogen panel in process. 2. Acute kidney injury.  Likely secondary to volume depletion from GI losses.  Continue IV fluids and monitor renal function. 3. Chronic kidney disease stage III.  Baseline creatinine approximately 1.0.  Continue to follow renal function. 4. Myasthenia gravis.  Continue on pyridostigmine 3 times daily.  She is also on chronic prednisone. 5. Diabetes mellitus type 2.  P.o. intake has been poor and she has had episodes of hypoglycemia.  Hold oral agents and basal insulin.  Continue on sliding scale insulin. 6. Hyperlipidemia.  Continue Crestor and Zetia 7. Hypothyroidism.  Continue on Synthroid 8. Non-anion gap metabolic acidosis.  Likely related to GI losses.  Currently on bicarbonate infusion. 9. Right pelvic cystic mass, likely ovarian.  Incidental finding on CT.  Will need outpatient ovarian ultrasound once acute issues have resolved.  [employer]

## 2020-06-12 NOTE — ED Notes (Signed)
Gave pt juice and peanut butter, Sugar check at 12:00 was 54.

## 2020-06-12 NOTE — ED Notes (Addendum)
Pt assisted to bedside commode. Pt observed to have soft stool with minor bleeding noted Pt states is from hemorrhoids. Pt still unable to provide urine sample at this time.

## 2020-06-12 NOTE — H&P (Signed)
History and Physical    [name] UVO:[id number] DOB: Aug 05, 1941 DOA: 06/11/2020  PCP: Fayrene Helper, MD   Patient coming from: Home.  I have personally briefly reviewed patient's old medical records in [place]  Chief Complaint: Diarrhea for 4 weeks.  HPI: [name] is a 79 y.o. female with medical history significant of normocytic anemia, anxiety, depression, osteoarthritis of the lumbar spine, carpal tunnel syndrome, chronic bronchitis, stage III CKD, COPD, diverticulosis, erosive esophagitis, GERD, unspecified headaches, hyperlipidemia, hypertension, mitral regurgitation, myasthenia gravis in remission, obesity OSA on CPAP, history of pneumonia, pulmonary hypertension, Schatzki's ring, seasonal allergies, shoulder pain, thyroid disease, tobacco abuse, type 2 diabetes mellitus who is coming to the emergency department with complaints of diarrhea for the past 4 weeks associated with weakness and 3 or 4 episodes of emesis since her symptoms begun.  She threw up once last night.  She has been having hematochezia on locations.  She has frequent constipation.  She denies dysuria, frequency or hematuria.  She denies fever, but complains of chills and fatigue.  No rhinorrhea, sore throat, dyspnea, wheezing or hemoptysis.  Denies chest pain, palpitations, diaphoresis, PND, orthopnea or recent pitting edema of the lower extremities.  She denies polyuria, polydipsia, polyphagia or blurred vision.  She denies travel history or sick contacts.  ED Course: Initial vital signs were temperature 97.9 F, pulse 77, respiration 16, blood pressure 133/41 mmHg and O2 sat 98% on room air.  The patient received a 500 mL bolus, ciprofloxacin 400 and metronidazole 500 mg IVPB.  CBC shows a white count of 17.7 with 83% neutrophils, 5% lymphocytes and 8% monocytes.  Hemoglobin 9.1 g/dL and platelets 560.  Lipase was normal.  CMP showed a potassium of 3.2 mmol/L.  All other CMP electrolytes are  within normal range when calcium is corrected to albumin.  Glucose 250, BUN 35 and creatinine 1.68 mg/dL.  In April creatinine was 1.19 mg/dL.  LFTs show a mildly elevated alk phos, normal AST, ALT and total bilirubin.  Total protein 6.4 and albumin 2.3 g/dL.  This is 1.5 and phosphorus 3.0 mg/dL.  Imaging: CT abdomen/pelvis without contrast showed diffuse circumferential wall thickening of much of the descending colon and sigmoid colon with multiple enlarged adjacent regional lymph nodes likely due to infectious or inflammatory process.  Colonoscopy as an outpatient recommend.  Is also a complex 60 mass in the right hemipelvis likely associated with a right ovary measuring up to 3.9 cm, for which radiologist recommending follow-up with nonemergent outpatient pelvic US.  Large hiatal hernia and nonobstructive right nephrolithiasis.  Please see images and full regular report for further detail.  Review of Systems: As per HPI otherwise all other systems reviewed and are negative.  Past Medical History:  Diagnosis Date  . Anemia in chronic renal disease 12/30/2015  . Anxiety   . Arthritis    "all over" (01/19/2014)  . Arthritis, lumbar spine   . Carpal tunnel syndrome   . Chronic bronchitis ([place])    "got it q yr for awhile; hasn't had it in awhile" (01/19/2014)  . Chronic kidney disease (CKD) stage G3b/A1, moderately decreased glomerular filtration rate (GFR) between 30-44 mL/min/1.73 square meter and albuminuria creatinine ratio less than 30 mg/g 09/14/2009   Qualifier: Diagnosis of  By: Moshe Cipro MD, Joycelyn Schmid    . Chronic renal disease, stage 3, moderately decreased glomerular filtration rate (GFR) between 30-59 mL/min/1.73 square meter 09/14/2009   Qualifier: Diagnosis of  By: Moshe Cipro MD, Joycelyn Schmid    .  Complication of anesthesia    combative  . COPD (chronic obstructive pulmonary disease) ([place])   . Depression   . Diverticulosis 07/2004   Colonscopy Dr Gala Romney  . Esophagitis, erosive 2009  .  Exertional shortness of breath   . Gastroesophageal reflux   . CVELFYBO(175.1)    "usually a couple times/wk" (01/19/2014)  . Heart murmur    saw cardiology In [place], he told her she did not need to come back.  . Hyperlipidemia   . Hypertension   . Impingement syndrome, shoulder   . Iron deficiency anemia   . Mitral regurgitation   . Myasthenia gravis   . Myasthenia gravis in remission ([place]) 11/24/2014  . Myasthenia gravis in remission ([place])   . Obesity   . OSA on CPAP    Negative on last sleep study  . Pneumonia 01/2012  . Pulmonary HTN ([place])   . Schatzki's ring    Last EGD w/ dilation 02/08/11, 2009 & 2007  . Seasonal allergies   . Shoulder pain   . Thyroid disease    "used to take RX; they took me off it" (01/19/2014)  . Tobacco abuse   . Type II diabetes mellitus ([place])    Past Surgical History:  Procedure Laterality Date  . [place] VITRECTOMY WITH 20 GAUGE MVR PORT Left 01/19/2014   Procedure: 25 GAUGE PARS PLANA VITRECTOMY WITH 20 GAUGE MVR PORT; MEMBRAME PEEL; SERUM PATCH; LASER TREATMENT; C3F8;  Surgeon: Hayden Pedro, MD;  Location: [place];  Service: Ophthalmology;  Laterality: Left;  . ABDOMINAL HYSTERECTOMY    . AGILE CAPSULE N/A 08/05/2019   Procedure: AGILE CAPSULE;  Surgeon: Daneil Dolin, MD;  [facility];  Service: Endoscopy;  Laterality: N/A;  7:30am  . APPENDECTOMY    . CARPAL TUNNEL RELEASE Bilateral   . CATARACT EXTRACTION W/PHACO Left 10/21/2013   Procedure: LEFT CATARACT EXTRACTION PHACO AND INTRAOCULAR LENS PLACEMENT (IOC);  Surgeon: Marylynn Pearson, MD;  Location: [place];  Service: Ophthalmology;  Laterality: Left;  . CHOLECYSTECTOMY    . COLONOSCOPY  05/08/2012   [facility]:ENIDPOEU and external hemorrhoids; colonic diverticulosis  . COLONOSCOPY WITH PROPOFOL N/A 02/26/2019   two simple adenomas, diverticulosis,  and internal hemorrhoids.   . ESOPHAGEAL DILATION     "more than 3 times" (01/19/2014)  . ESOPHAGOGASTRODUODENOSCOPY  02/08/11    [place]-Distal esophageal erosion consistent with mild erosive reflux   esophagitis/ Noncritical Schatzki ring, small hiatal hernia otherwise upper/ gastrointestinal tract appeared unremarkable, status post passage  of a Maloney dilation to biopsy disruption of the ring described  . ESOPHAGOGASTRODUODENOSCOPY  04/2012   2 tandem incomplete distal esophagea rings s/p dilation.   . ESOPHAGOGASTRODUODENOSCOPY N/A 09/16/2014   Dr. Gala Romney: Schatzki ring status post dilation/disruption.  Hiatal hernia.  Marland Kitchen ESOPHAGOGASTRODUODENOSCOPY (EGD) WITH PROPOFOL N/A 12/08/2018   EGD with mild Schatzki ring s/p dilation, small hiatal hernia, otherwise normal  . EYE SURGERY     cataracts, bilateral  . GIVENS CAPSULE STUDY N/A 09/29/2019   Procedure: GIVENS CAPSULE STUDY;  Surgeon: Daneil Dolin, MD;  [facility];  Service: Endoscopy;  Laterality: N/A;  7:30am  . INCONTINENCE SURGERY  08/26/09   [place]  . JOINT REPLACEMENT     right total knee  . MALONEY DILATION N/A 09/16/2014   Procedure: Venia Minks DILATION;  Surgeon: Daneil Dolin, MD;  [facility];  Service: Endoscopy;  Laterality: N/A;  . MALONEY DILATION N/A 12/08/2018   Procedure: MALONEY DILATION;  Surgeon: Daneil Dolin, MD;  [facility];  Service: Endoscopy;  Laterality: N/A;  . PARS PLANA VITRECTOMY W/ REPAIR OF MACULAR HOLE Left 01/19/2014  . POLYPECTOMY  02/26/2019   Procedure: POLYPECTOMY;  Surgeon: Danie Binder, MD;  [facility];  Service: Endoscopy;;  ascending colon polyps x2  . SAVORY DILATION N/A 09/16/2014   Procedure: SAVORY DILATION;  Surgeon: Daneil Dolin, MD;  [facility];  Service: Endoscopy;  Laterality: N/A;  . TONSILLECTOMY    . TOTAL KNEE ARTHROPLASTY Right 05/13/07   Dr. Aline Brochure  . TRIGGER FINGER RELEASE Right 06/26/2018   Procedure: RIGHT INDEX FINGER TRIGGER FINGER/A-1 PULLEY RELEASE;  Surgeon: Carole Civil, MD;  [facility];  Service: Orthopedics;   Laterality: Right;   Social History  reports that she quit smoking about 16 years ago. Her smoking use included cigarettes. She has a 12.50 pack-year smoking history. She has never used smokeless tobacco. She reports that she does not drink alcohol and does not use drugs.  No Known Allergies  Family History  Problem Relation Age of Onset  . Cancer Mother   . Heart disease Mother   . Cancer Father   . Heart disease Father   . Diabetes Sister   . Hypertension Brother   . Heart disease Brother   . Cancer Sister   . Kidney failure Sister   . Diabetes Son   . Hypertension Son   . Hypertension Daughter   . Colon cancer Neg Hx    Prior to Admission medications   Medication Sig Start Date End Date Taking? Authorizing Provider  ACETAMINOPHEN EXTRA STRENGTH 500 MG tablet TAKE (1) TABLET BY MOUTH AT BEDTIME. 04/20/20   Fayrene Helper, MD  amLODipine (NORVASC) 10 MG tablet TAKE 1 TABLET BY MOUTH ONCE A DAY. 05/17/20   Fayrene Helper, MD  B-D ULTRAFINE III SHORT PEN 31G X 8 MM MISC USE ONCE DAILY WITH LANTUS SOLOSTAR PEN. 01/21/20   Fayrene Helper, MD  brimonidine-timolol (COMBIGAN) 0.2-0.5 % ophthalmic solution Place 1 drop into both eyes every 12 (twelve) hours.    [provider]  cetirizine (ZYRTEC) 10 MG tablet TAKE 1 TABLET BY MOUTH ONCE A DAY. 05/17/20   Fayrene Helper, MD  Cholecalciferol (VITAMIN D3) 2000 units TABS Take 2,000 Units by mouth at bedtime.    [provider]  citalopram (CELEXA) 10 MG tablet TAKE 1 TABLET BY MOUTH ONCE A DAY. 04/14/20   Fayrene Helper, MD  [facility] Liver Oil CAPS Take by mouth. 500 MG per patient    [provider]  Cranberry 1000 MG CAPS Take 1,000 mg by mouth daily.    [provider]  cycloSPORINE (RESTASIS) 0.05 % ophthalmic emulsion Place 1 drop into both eyes 2 (two) times daily.     [provider]  diltiazem (CARDIZEM CD) 240 MG 24 hr capsule TAKE (1) CAPSULE BY MOUTH TWICE DAILY. 05/17/20    Fayrene Helper, MD  doxycycline (VIBRAMYCIN) 100 MG capsule Take 1 capsule (100 mg total) by mouth 2 (two) times daily. 04/29/20   Hay[place] Rasmussen, MD  ezetimibe (ZETIA) 10 MG tablet TAKE 1 TABLET BY MOUTH ONCE A DAY. 05/17/20   Fayrene Helper, MD  fluticasone (FLONASE) 50 MCG/ACT nasal spray PLACE 2 SPRAYS INTO BOTH NOSTRILS DAILY. 04/20/20   Fayrene Helper, MD  gabapentin (NEURONTIN) 300 MG capsule TAKE (1) CAPSULE BY MOUTH TWICE DAILY. 05/17/20   [name]  E, MD  hydrALAZINE (APRESOLINE) 50 MG tablet TAKE 1 TABLET BY MOUTH THREE TIMES A DAY. 05/17/20   Fayrene Helper, MD  HYDROcodone-acetaminophen (NORCO) 5-325 MG tablet Take 1 tablet by mouth 2 (two) times daily. 05/20/20   Perlie Mayo, NP  Lancets [facility] DELICA PLUS [id number]) MISC USE AS DIRECTED 3 TIMES DAILY FOR BLOOD GLUCOSE TESTING. 01/21/20   Fayrene Helper, MD  LANTUS SOLOSTAR 100 UNIT/ML Solostar Pen INJECT 30 UNITS SUBCUTANEOUSLY ONCE DAILY. 11/23/19   Fayrene Helper, MD  linaclotide [facility]) 72 MCG capsule Take 1 capsule (72 mcg total) by mouth daily before breakfast. 12/16/19   Annitta Needs, NP  losartan (COZAAR) 100 MG tablet TAKE (1) TABLET BY MOUTH DAILY. 05/17/20   Fayrene Helper, MD  Multiple Vitamin (MULTIVITAMIN WITH MINERALS) TABS tablet Take 1 tablet by mouth at bedtime.    [provider]  ondansetron (ZOFRAN) 4 MG tablet Take 1 tablet (4 mg total) by mouth every 8 (eight) hours as needed for nausea or vomiting. 04/29/20   Hayden Rasmussen, MD  ondansetron (ZOFRAN-ODT) 8 MG disintegrating tablet Take 1 tablet (8 mg total) by mouth every 8 (eight) hours as needed for nausea or vomiting. 09/23/19   Fayrene Helper, MD  [facility] VERIO test strip USE AS DIRECTED TO TEST BLOOD GLUCOSE 3 TIMES DAILY. 04/22/20   Fayrene Helper, MD  pantoprazole (PROTONIX) 40 MG tablet TAKE 1 TABLET BY MOUTH ONCE A DAY, 30 MINUTES BEFORE BREAKFAST. 01/21/20   Mahala Menghini, PA-C    predniSONE (DELTASONE) 5 MG tablet Take 1 tablet (5 mg total) by mouth daily. 06/01/19   Carole Civil, MD  PROAIR HFA 108 [id number] Base) MCG/ACT inhaler INHALE 2 PUFFS BY MOUTH EVERY 6 HOURS AS NEEDED . 11/23/19   Fayrene Helper, MD  pyridostigmine (MESTINON) 60 MG tablet TAKE (1) TABLET BY MOUTH THREE TIMES A DAY. 05/17/20   Fayrene Helper, MD  rosuvastatin (CRESTOR) 40 MG tablet TAKE (1) TABLET BY MOUTH AT BEDTIME. 03/18/20   Fayrene Helper, MD  solifenacin (VESICARE) 10 MG tablet TAKE 1 TABLET BY MOUTH ONCE A DAY. 03/21/20   Perlie Mayo, NP  spironolactone (ALDACTONE) 25 MG tablet TAKE 1 TABLET BY MOUTH ONCE A DAY. 04/14/20   Fayrene Helper, MD  TRADJENTA 5 MG TABS tablet TAKE 1 TABLET BY MOUTH ONCE A DAY. 05/17/20   Fayrene Helper, MD  UNABLE TO FIND Diabetic shoes x 1 Inserts x 3 DX E11.9 07/21/18   Fayrene Helper, MD  venlafaxine XR (EFFEXOR-XR) 150 MG 24 hr capsule TAKE 1 CAPSULE BY MOUTH DAILY. 05/17/20   Fayrene Helper, MD   Physical Exam: Vitals:   06/11/20 2244 06/11/20 2246 06/12/20 0507  BP:  (!) 133/41 (!) 157/62  Pulse:  77 73  Resp:  16 18  Temp:  97.9 F (36.6 C)   TempSrc:  Oral   SpO2:  98% 99%  Weight: 106.6 kg    Height: _0  (1.676 m)     Constitutional: Looks chronically ill, but currently in NAD Eyes: PERRL, lids and conjunctivae normal ENMT: Mucous membranes are dry.  Posterior pharynx clear of any exudate or lesions. Neck: normal, supple, no masses, no thyromegaly Respiratory: clear to auscultation bilaterally, no wheezing, no crackles. Normal respiratory effort. No accessory muscle use.  Cardiovascular: Regular rate and rhythm, 2/6 systolic murmur, no rubs / gallops. No extremity edema. 2+ pedal pulses. No carotid bruits.  Abdomen:  Obese, nondistended.  BS positive.  Soft, LLQ tenderness, no masses palpated. No hepatosplenomegaly. Musculoskeletal: no clubbing / cyanosis. Good ROM, no contractures. Normal muscle tone.  Skin:  no rashes, lesions, ulcers on limited dermatological examination. Neurologic: CN 2-12 grossly intact. Sensation intact, DTR normal. Strength 5/5 in all 4.  Psychiatric: Normal judgment and insight. Alert and oriented x 3. Normal mood.   Labs on Admission: I have personally reviewed following labs and imaging studies  CBC: Recent Labs  Lab 06/12/20 0020  WBC 17.7*  NEUTROABS 14.7*  HGB 9.1*  HCT 29.0*  MCV 84.8  PLT 754*   Basic Metabolic Panel: Recent Labs  Lab 06/12/20 0020  NA 135  K 3.2*  CL 98  CO2 24  GLUCOSE 250*  BUN 35*  CREATININE 1.68*  CALCIUM 8.2*  MG 1.5*  PHOS 3.0   GFR: Estimated Creatinine Clearance: 34.6 mL/min (A) (by C-G formula based on SCr of 1.68 mg/dL (H)).  Liver Function Tests: Recent Labs  Lab 06/12/20 0020  AST 18  ALT 15  ALKPHOS 129*  BILITOT 0.8  PROT 6.4*  ALBUMIN 2.3*   Radiological Exams on Admission: CT ABDOMEN PELVIS WO CONTRAST  Result Date: 06/12/2020 CLINICAL DATA:  Left lower quadrant abdominal pain. EXAM: CT ABDOMEN AND PELVIS WITHOUT CONTRAST TECHNIQUE: Multidetector CT imaging of the abdomen and pelvis was performed following the standard protocol without IV contrast. COMPARISON:  None. FINDINGS: Lower chest: The lung bases are clear. The heart size is normal. Hepatobiliary: The liver is normal. Status post cholecystectomy.There is no biliary ductal dilation. Pancreas: Normal contours without ductal dilatation. No peripancreatic fluid collection. Spleen: Unremarkable. Adrenals/Urinary Tract: --Adrenal glands: Unremarkable. --Right kidney/ureter: There is a nonobstructing stone in the upper pole the right kidney. --Left kidney/ureter: No hydronephrosis or radiopaque kidney stones. --Urinary bladder: Unremarkable. Stomach/Bowel: --Stomach/Duodenum: There is a large hiatal hernia. --Small bowel: Unremarkable. --Colon: There is diffuse circumferential wall thickening of much of the descending colon and sigmoid colon. There are  multiple colonic diverticula. There are enlarged adjacent regional lymph nodes. There is a moderate amount of stool in the remaining portions of the colon. There may be a single inflamed diverticulum in the ascending colon (axial series 2, image 37). --Appendix: Not visualized. No right lower quadrant inflammation or free fluid. Vascular/Lymphatic: Atherosclerotic calcification is present within the non-aneurysmal abdominal aorta, without hemodynamically significant stenosis. --No retroperitoneal lymphadenopathy. --there are mildly enlarged mesenteric lymph nodes. There are multiple enlarged regional lymph nodes along the course of the descending colon. --No pelvic or inguinal lymphadenopathy. Reproductive: There is a complex cystic mass in the right hemipelvis likely associated with the right ovary measuring 3.9 x 2.6 cm (axial series 2, image 69). There is a small cystic structure involving the left ovary measuring approximately 1.8 cm. The patient is status post prior hysterectomy. Other: No ascites or free air. The abdominal wall is normal. Musculoskeletal. No acute displaced fractures. IMPRESSION: 1. Diffuse circumferential wall thickening of much of the descending colon and sigmoid colon with multiple enlarged adjacent regional lymph nodes. Findings are concerning for an infectious or inflammatory colitis. Correlation with patient's symptoms and laboratory studies is recommended. As an underlying mass cannot be excluded, a follow-up colonoscopy is recommended as an outpatient. 2. Complex cystic mass in the right hemipelvis likely associated with the right ovary measuring up to 3.9 cm. Follow-up with a nonemergent outpatient pelvic ultrasound is recommended. 3. Large hiatal hernia. 4. Nonobstructive right nephrolithiasis. Aortic Atherosclerosis (ICD10-I70.0). Electronically Signed   By: Harrell Gave  Green M.D.   On: 06/12/2020 04:00   EKG: Independently reviewed.  Vent. rate 59 BPM PR interval 160 ms QRS  duration 86 ms QT/QTc 464/459 ms P-R-T axes 69 19 63 Sinus bradycardia Otherwise normal ECG  Assessment/Plan Principal Problem:   Acute colitis Observation/telemetry. Continue IV hydration. Full liquid diet. Continue ciprofloxacin 400 mg IVPB q12 hr. Continue metronidazole 500 mg IVPB q 8 hours. Follow-up CBC and CMP. GI evaluation for colonoscopy   Active Problems:   AKI (acute kidney injury) ([place])   Dehydration.   Chronic renal disease due to type 2 diabetes mellitus ([place]) Continue IVF. Monitor intake and output. Follow renal function electrolytes.    Hypokalemia Replacing. Check magnesium level. Follow-up potassium level.    Hypomagnesemia Replacement ordered. Follow-up magnesium level as needed.    Hypothyroidism Continue Synthroid pending med rec.    Type 2 diabetes mellitus (HCC) CBG monitoring with RI SS. Check hemoglobin A1c.    Hyperlipemia On Crestor at bedtime.    IDA (iron deficiency anemia) Monitor H&H. Transfuse as needed.    Depression with anxiety Has been on Celexa. Continue treatment pending med rec.    Unspecified glaucoma Continue Combigan once med rec performed.    GERD Continue PPI.    DVT prophylaxis: SCDs. Code Status:   Full code. Family Communication:   Disposition Plan:   Patient is from:  Home.  Anticipated DC to:  Home.  Anticipated DC date:  06/13/2020.  Anticipated DC barriers: Clinical improvement.  Consults called: Admission status:  Observation/telemetry.    Severity of Illness: Moderate to high.  Reubin Milan MD [facility]  How to contact the [facility] Attending or Consulting provider [place] or covering provider during after hours [place], for this patient?   1. Check the care team in [facility] and look for a) attending/consulting [facility] provider listed and b) the [facility] team listed 2. Log into [email or web address] and use [place]'s universal password to access. If you do not have the password, please contact  the hospital operator. 3. Locate the [facility] provider you are looking for under [facility] and page to a number that you can be directly reached. 4. If you still have difficulty reaching the provider, please page the [facility] (Director on Call) for the Hospitalists listed on amion for assistance.  06/12/2020, 6:25 AM   This document was prepared using Dragon voice recognition software and may contain some unintended transcription errors.

## 2020-06-13 LAB — GASTROINTESTINAL PANEL BY PCR, STOOL (REPLACES STOOL CULTURE)

## 2020-06-13 LAB — CBC WITH DIFFERENTIAL/PLATELET
Abs Immature Granulocytes: 0.49 10*3/uL — ABNORMAL HIGH (ref 0.00–0.07)
Basophils Absolute: 0 10*3/uL (ref 0.0–0.1)
Basophils Relative: 0 %
Eosinophils Absolute: 0.5 10*3/uL (ref 0.0–0.5)
Eosinophils Relative: 3 %
HCT: 24.2 % — ABNORMAL LOW (ref 36.0–46.0)
Hemoglobin: 7.9 g/dL — ABNORMAL LOW (ref 12.0–15.0)
Immature Granulocytes: 3 %
Lymphocytes Relative: 10 %
Lymphs Abs: 1.5 10*3/uL (ref 0.7–4.0)
MCH: 27 pg (ref 26.0–34.0)
MCHC: 32.6 g/dL (ref 30.0–36.0)
MCV: 82.6 fL (ref 80.0–100.0)
Monocytes Absolute: 1.6 10*3/uL — ABNORMAL HIGH (ref 0.1–1.0)
Monocytes Relative: 11 %
Neutro Abs: 11.1 10*3/uL — ABNORMAL HIGH (ref 1.7–7.7)
Neutrophils Relative %: 73 %
Platelets: 453 10*3/uL — ABNORMAL HIGH (ref 150–400)
RBC: 2.93 MIL/uL — ABNORMAL LOW (ref 3.87–5.11)
RDW: 17 % — ABNORMAL HIGH (ref 11.5–15.5)
WBC Morphology: INCREASED
WBC: 15.3 10*3/uL — ABNORMAL HIGH (ref 4.0–10.5)
nRBC: 0.1 % (ref 0.0–0.2)

## 2020-06-13 LAB — COMPREHENSIVE METABOLIC PANEL
ALT: 12 U/L (ref 0–44)
AST: 16 U/L (ref 15–41)
Albumin: 1.7 g/dL — ABNORMAL LOW (ref 3.5–5.0)
Alkaline Phosphatase: 107 U/L (ref 38–126)
Anion gap: 8 (ref 5–15)
BUN: 24 mg/dL — ABNORMAL HIGH (ref 8–23)
CO2: 25 mmol/L (ref 22–32)
Calcium: 7.4 mg/dL — ABNORMAL LOW (ref 8.9–10.3)
Chloride: 101 mmol/L (ref 98–111)
Creatinine, Ser: 1.18 mg/dL — ABNORMAL HIGH (ref 0.44–1.00)
GFR calc Af Amer: 52 mL/min — ABNORMAL LOW (ref >60)
GFR calc non Af Amer: 44 mL/min — ABNORMAL LOW (ref >60)
Glucose, Bld: 101 mg/dL — ABNORMAL HIGH (ref 70–99)
Potassium: 3.3 mmol/L — ABNORMAL LOW (ref 3.5–5.1)
Sodium: 134 mmol/L — ABNORMAL LOW (ref 135–145)
Total Bilirubin: 0.6 mg/dL (ref 0.3–1.2)
Total Protein: 5.2 g/dL — ABNORMAL LOW (ref 6.5–8.1)

## 2020-06-13 LAB — GLUCOSE, CAPILLARY
Glucose-Capillary: 95 mg/dL (ref 70–99)
Glucose-Capillary: 100 mg/dL — ABNORMAL HIGH (ref 70–99)
Glucose-Capillary: 111 mg/dL — ABNORMAL HIGH (ref 70–99)
Glucose-Capillary: 160 mg/dL — ABNORMAL HIGH (ref 70–99)
Glucose-Capillary: 124 mg/dL — ABNORMAL HIGH (ref 70–99)

## 2020-06-13 MED ORDER — TIMOLOL MALEATE 0.5 % OP SOLN
1.0000 [drp] | Freq: Two times a day (BID) | OPHTHALMIC | Status: DC
  Administered 2020-06-13 – 2020-06-20 (×15): 1 [drp] via OPHTHALMIC
  Filled 2020-06-13 (×2): qty 5

## 2020-06-13 MED ORDER — SODIUM BICARBONATE 8.4 % IV SOLN
INTRAVENOUS | Status: AC
  Filled 2020-06-13: qty 100

## 2020-06-13 MED ORDER — SODIUM BICARBONATE 8.4 % IV SOLN
INTRAVENOUS | Status: AC
  Filled 2020-06-13: qty 50

## 2020-06-13 MED ORDER — BRIMONIDINE TARTRATE 0.2 % OP SOLN
1.0000 [drp] | Freq: Two times a day (BID) | OPHTHALMIC | Status: DC
  Administered 2020-06-13 – 2020-06-20 (×15): 1 [drp] via OPHTHALMIC
  Filled 2020-06-13 (×2): qty 5

## 2020-06-13 MED ORDER — ADULT MULTIVITAMIN W/MINERALS CH
1.0000 | ORAL_TABLET | Freq: Every day | ORAL | Status: DC
  Administered 2020-06-13 – 2020-06-20 (×8): 1 via ORAL
  Filled 2020-06-13 (×8): qty 1

## 2020-06-13 NOTE — Plan of Care (Signed)
  Problem: Activity: Goal: Risk for activity intolerance will decrease 06/13/2020 1553 by Berton Bon, RN Outcome: Progressing 06/13/2020 1452 by Berton Bon, RN Outcome: Progressing   Problem: Nutrition: Goal: Adequate nutrition will be maintained 06/13/2020 1553 by Berton Bon, RN Outcome: Progressing 06/13/2020 1452 by Berton Bon, RN Outcome: Progressing   Problem: Coping: Goal: Level of anxiety will decrease Outcome: Progressing   Problem: Elimination: Goal: Will not experience complications related to bowel motility Outcome: Progressing Goal: Will not experience complications related to urinary retention 06/13/2020 1553 by Berton Bon, RN Outcome: Progressing 06/13/2020 1452 by Berton Bon, RN Outcome: Progressing   Problem: Pain Managment: Goal: General experience of comfort will improve 06/13/2020 1553 by Berton Bon, RN Outcome: Progressing 06/13/2020 1452 by Berton Bon, RN Outcome: Progressing   Problem: Safety: Goal: Ability to remain free from injury will improve Outcome: Progressing   Problem: Skin Integrity: Goal: Risk for impaired skin integrity will decrease Outcome: Progressing

## 2020-06-13 NOTE — Plan of Care (Signed)
?  Problem: Activity: ?Goal: Risk for activity intolerance will decrease ?Outcome: Progressing ?  ?Problem: Nutrition: ?Goal: Adequate nutrition will be maintained ?Outcome: Progressing ?  ?Problem: Elimination: ?Goal: Will not experience complications related to urinary retention ?Outcome: Progressing ?  ?Problem: Pain Managment: ?Goal: General experience of comfort will improve ?Outcome: Progressing ?  ?Problem: Safety: ?Goal: Ability to remain free from injury will improve ?Outcome: Progressing ?  ?Problem: Skin Integrity: ?Goal: Risk for impaired skin integrity will decrease ?Outcome: Progressing ?  ?

## 2020-06-13 NOTE — Progress Notes (Signed)
Patient Demographics:    [name], is a 79 y.o. female, DOB - Aug 19, 1941, QMV:[id number]  Admit date - 06/11/2020   Admitting Physician Kathie Dike, MD  Outpatient Primary MD for the patient is Fayrene Helper, MD  LOS - 1   Chief Complaint  Patient presents with  . Diarrhea        Subjective:    [name] today has no fevers, no emesis,  No chest pain,   -Frequent large-volume watery stools persist -Patient reluctance to eat  Assessment  & Plan :    Principal Problem:   Acute colitis Active Problems:   Hypothyroidism   Type 2 diabetes mellitus (HCC)   Hyperlipemia   IDA (iron deficiency anemia)   Depression with anxiety   Unspecified glaucoma   GERD   CKD stage 3 due to type 2 diabetes mellitus ([place])   Obstructive sleep apnea   Myasthenia gravis in remission ([place])   Hypokalemia   AKI (acute kidney injury) ([place])   Hypomagnesemia   Colitis  1)Acute colitis--- suspect infectious etiology, continue  intravenous antibiotics with ciprofloxacin and Flagyl.  Will need outpatient GI follow-up for colonoscopy once acute issues have resolved.  Continue on liquid diet for now.   -C. difficile and GI pathogen testing requested  2)ABLA---??? is down to 7.9 from 11.2 query acute blood loss versus hemodilution -Monitor closely and transfuse as clinically indicated  3)Acute kidney injury superimposed on CKD stage IIIa---Likely secondary to volume depletion from GI losses.   Creatinine worse 1.68  -Hydrate and avoid nephrotoxic agents  4)Myasthenia gravis--- Continue on pyridostigmine 3 times daily. She is also on chronic prednisone  5)Diabetes mellitus type 2-- P.o. intake has been poor and she has had episodes of hypoglycemia. Hold oral agents and basal insulin onto oral intake is more reliable---Continue on sliding scale insulin.  6)Hyperlipidemia. Continue Crestor  and Zetia  7)Hypothyroidism. Continue on Synthroid  8)Non-anion gap metabolic acidosis. Likely related to GI losses.  Resolved with bicarbonate infusion.  9)Right pelvic cystic mass, likely ovarian. Incidental finding on CT. Will need outpatient ovarian ultrasound once acute issues have resolved.---Complex cystic mass in the right hemipelvis likely associated with the right ovary measuring up to 3.9 cm. Follow-up with a nonemergent outpatient pelvic ultrasound is recommended. --- NeedsPelvic ultrasound and colonoscopy  10)Findings are concerning for an infectious or inflammatory colitis.. As an underlying mass cannot be excluded, a follow-up colonoscopy is recommended as an outpatient  11)Ulcer in Perineum--Lt > Rt--- looks like pressure ulcers from thighs rubbing together--- topical wound care  Disposition/Need for in-Hospital Stay- patient unable to be discharged at this time due to --infectious colitis requiring IV antibiotics pending C. difficile and GI pathogen test results*  Status is: Inpatient  Remains inpatient appropriate because:Inpatient level of care appropriate due to severity of illness   Disposition: The patient is from: Home              Anticipated d/c is to: SNF              Anticipated d/c date is: 2 days              Patient currently is not medically stable to d/c. Barriers: Not Clinically Stable- --frequent large-volume diarrhea requiring  IV fluids and IV antibiotics  Code Status : full  Family Communication:   patient is alert, awake and coherent)   Consults  :  na  DVT Prophylaxis  :   SCDs *   Lab Results  Component Value Date   PLT 453 (H) 06/13/2020    Inpatient Medications  Scheduled Meds: . brimonidine  1 drop Both Eyes Q12H   And  . timolol  1 drop Both Eyes Q12H  . citalopram  10 mg Oral Daily  . cycloSPORINE  1 drop Both Eyes BID  . darifenacin  15 mg Oral Daily  . ezetimibe  10 mg Oral Daily  . gabapentin  300 mg Oral  BID  . insulin aspart  0-20 Units Subcutaneous TID WC  . multivitamin with minerals  1 tablet Oral Daily  . pantoprazole  40 mg Oral Daily  . predniSONE  5 mg Oral Daily  . pyridostigmine  60 mg Oral TID  . rosuvastatin  40 mg Oral q1800  . venlafaxine XR  150 mg Oral Daily   Continuous Infusions: . ciprofloxacin 400 mg (06/13/20 1709)  . dextrose 5 % 1,000 mL with potassium chloride 20 mEq, sodium bicarbonate 150 mEq infusion 100 mL/hr at 06/13/20 2010  . metronidazole 500 mg (06/13/20 1509)   PRN Meds:.acetaminophen **OR** acetaminophen, HYDROcodone-acetaminophen, prochlorperazine    Anti-infectives (From admission, onward)   Start     Dose/Rate Route Frequency Ordered Stop   06/12/20 1600  ciprofloxacin (CIPRO) IVPB 400 mg     Discontinue     400 mg 200 mL/hr over 60 Minutes Intravenous Every 12 hours 06/12/20 0515     06/12/20 1400  metroNIDAZOLE (FLAGYL) IVPB 500 mg     Discontinue     500 mg 100 mL/hr over 60 Minutes Intravenous Every 8 hours 06/12/20 0535     06/12/20 0530  metroNIDAZOLE (FLAGYL) IVPB 500 mg  Status:  Discontinued        500 mg 100 mL/hr over 60 Minutes Intravenous Every 8 hours 06/12/20 0515 06/12/20 0535   06/12/20 0500  ciprofloxacin (CIPRO) IVPB 400 mg        400 mg 200 mL/hr over 60 Minutes Intravenous  Once 06/12/20 0447 06/12/20 0639   06/12/20 0500  metroNIDAZOLE (FLAGYL) IVPB 500 mg        500 mg 100 mL/hr over 60 Minutes Intravenous  Once 06/12/20 0447 06/12/20 0639        Objective:   Vitals:   06/13/20 0521 06/13/20 0855 06/13/20 1159 06/13/20 1400  BP: 118/74 (!) 101/56 (!) 109/49 (!) 105/48  Pulse: 80 83 83 80  Resp: 16 14 16 16   Temp: 99.9 F (37.7 C) 99.1 F (37.3 C) 98.5 F (36.9 C) 98.9 F (37.2 C)  TempSrc: Oral Oral Oral Oral  SpO2: 94% 98% 96% 94%  Weight:      Height:        Wt Readings from Last 3 Encounters:  06/12/20 104.1 kg  05/20/20 106.6 kg  04/29/20 106.7 kg     Intake/Output Summary (Last 24 hours)  at 06/13/2020 2022 Last data filed at 06/13/2020 1554 Gross per 24 hour  Intake 2971.58 ml  Output --  Net 2971.58 ml     Physical Exam  Gen:- Awake Alert,  In no apparent distress  HEENT:- [place].AT, No sclera icterus Neck-Supple Neck,No JVD,.  Lungs-  CTAB , fair symmetrical air movement CV- S1, S2 normal, regular  Abd-  +ve B.Sounds, Abd Soft, No tenderness,  Extremity/Skin:- No  edema, pedal pulses present  Psych-affect is appropriate, oriented x3 Neuro-no new focal deficits, no tremors   Data Review:   Micro Results Recent Results (from the past 240 hour(s))  Gastrointestinal Panel by PCR , Stool     Status: None   Collection Time: 06/12/20 12:50 AM   Specimen: Rectum; Stool  Result Value Ref Range Status   Campylobacter species NOT DETECTED NOT DETECTED Final   Plesimonas shigelloides NOT DETECTED NOT DETECTED Final   Salmonella species NOT DETECTED NOT DETECTED Final   Yersinia enterocolitica NOT DETECTED NOT DETECTED Final   Vibrio species NOT DETECTED NOT DETECTED Final   Vibrio cholerae NOT DETECTED NOT DETECTED Final   Enteroaggregative E coli (EAEC) NOT DETECTED NOT DETECTED Final   Enteropathogenic E coli (EPEC) NOT DETECTED NOT DETECTED Final   Enterotoxigenic E coli (ETEC) NOT DETECTED NOT DETECTED Final   Shiga like toxin producing E coli (STEC) NOT DETECTED NOT DETECTED Final   Shigella/Enteroinvasive E coli (EIEC) NOT DETECTED NOT DETECTED Final   Cryptosporidium NOT DETECTED NOT DETECTED Final   Cyclospora cayetanensis NOT DETECTED NOT DETECTED Final   Entamoeba histolytica NOT DETECTED NOT DETECTED Final   Giardia lamblia NOT DETECTED NOT DETECTED Final   Adenovirus F40/41 NOT DETECTED NOT DETECTED Final   Astrovirus NOT DETECTED NOT DETECTED Final   Norovirus GI/GII NOT DETECTED NOT DETECTED Final   Rotavirus A NOT DETECTED NOT DETECTED Final   Sapovirus (I, II, IV, and V) NOT DETECTED NOT DETECTED Final    Comment: Performed at [facility],  [address] [place], [place] [address]  SARS Coronavirus 2 by RT PCR (hospital order, performed in [place] hospital lab) Nasopharyngeal Nasopharyngeal Swab     Status: None   Collection Time: 06/12/20  4:48 AM   Specimen: Nasopharyngeal Swab  Result Value Ref Range Status   SARS Coronavirus 2 NEGATIVE NEGATIVE Final    Comment: (NOTE) SARS-CoV-2 target nucleic acids are NOT DETECTED.  The SARS-CoV-2 RNA is generally detectable in upper and lower respiratory specimens during the acute phase of infection. The lowest concentration of SARS-CoV-2 viral copies this assay can detect is 250 copies / mL. A negative result does not preclude SARS-CoV-2 infection and should not be used as the sole basis for treatment or other patient management decisions.  A negative result may occur with improper specimen collection / handling, submission of specimen other than nasopharyngeal swab, presence of viral mutation(s) within the areas targeted by this assay, and inadequate number of viral copies (<250 copies / mL). A negative result must be combined with clinical observations, patient history, and epidemiological information.  Fact Sheet for Patients:   StrictlyIdeas.no  Fact Sheet for Healthcare Providers: BankingDealers.co.za  This test is not yet approved or  cleared by the [place] FDA and has been authorized for detection and/or diagnosis of SARS-CoV-2 by FDA under an Emergency Use Authorization (EUA).  This EUA will remain in effect (meaning this test can be used) for the duration of the COVID-19 declaration under Section 564(b)(1) of the Act, 21 U.S.C. section 360bbb-3(b)(1), unless the authorization is terminated or revoked sooner.  Performed at [facility], [address], [place], [place] [address]     Radiology Reports CT ABDOMEN PELVIS WO CONTRAST  Result Date: 06/12/2020 CLINICAL DATA:  Left lower quadrant abdominal  pain. EXAM: CT ABDOMEN AND PELVIS WITHOUT CONTRAST TECHNIQUE: Multidetector CT imaging of the abdomen and pelvis was performed following the standard protocol without IV contrast.  COMPARISON:  None. FINDINGS: Lower chest: The lung bases are clear. The heart size is normal. Hepatobiliary: The liver is normal. Status post cholecystectomy.There is no biliary ductal dilation. Pancreas: Normal contours without ductal dilatation. No peripancreatic fluid collection. Spleen: Unremarkable. Adrenals/Urinary Tract: --Adrenal glands: Unremarkable. --Right kidney/ureter: There is a nonobstructing stone in the upper pole the right kidney. --Left kidney/ureter: No hydronephrosis or radiopaque kidney stones. --Urinary bladder: Unremarkable. Stomach/Bowel: --Stomach/Duodenum: There is a large hiatal hernia. --Small bowel: Unremarkable. --Colon: There is diffuse circumferential wall thickening of much of the descending colon and sigmoid colon. 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 There are enlarged adjacent regional lymph nodes. There is a moderate amount of stool in the remaining portions of the colon. There may be a single inflamed diverticulum in the ascending colon (axial series 2, image 37). --Appendix: Not visualized. No right lower quadrant inflammation or free fluid. Vascular/Lymphatic: Atherosclerotic calcification is present within the non-aneurysmal abdominal aorta, without hemodynamically significant stenosis. --No retroperitoneal lymphadenopathy. --there are mildly enlarged mesenteric lymph nodes. There are multiple enlarged regional lymph nodes along the course of the descending colon. --No pelvic or inguinal lymphadenopathy. Reproductive: There is a complex cystic mass in the right hemipelvis likely associated with the right ovary measuring 3.9 x 2.6 cm (axial series 2, image 69). There is a small cystic structure involving the left ovary measuring approximately 1.8 cm. The patient is status post prior hysterectomy.  Other: No ascites or free air. The abdominal wall is normal. Musculoskeletal. No acute displaced fractures. IMPRESSION: 1. Diffuse circumferential wall thickening of much of the descending colon and sigmoid colon with multiple enlarged adjacent regional lymph nodes. Findings are concerning for an infectious or inflammatory colitis. Correlation with patient's symptoms and laboratory studies is recommended. As an underlying mass cannot be excluded, a follow-up colonoscopy is recommended as an outpatient. 2. Complex cystic mass in the right hemipelvis likely associated with the right ovary measuring up to 3.9 cm. Follow-up with a nonemergent outpatient pelvic ultrasound is recommended. 3. Large hiatal hernia. 4. Nonobstructive right nephrolithiasis. Aortic Atherosclerosis (ICD10-I70.0). Electronically Signed   By: Constance Holster M.D.   On: 06/12/2020 04:00     CBC Recent Labs  Lab 06/12/20 0020 06/12/20 0915 06/13/20 0537  WBC 17.7* 18.0* 15.3*  HGB 9.1* 9.2* 7.9*  HCT 29.0* 29.9* 24.2*  PLT 560* 576* 453*  MCV 84.8 89.0 82.6  MCH 26.6 27.4 27.0  MCHC 31.4 30.8 32.6  RDW 17.6* 18.3* 17.0*  LYMPHSABS 0.9 0.8 1.5  MONOABS 1.4* 1.5* 1.6*  EOSABS 0.2 0.2 0.5  BASOSABS 0.1 0.1 0.0    Chemistries  Recent Labs  Lab 06/12/20 0020 06/12/20 0900 06/13/20 0537  NA 135 132* 134*  K 3.2* 3.7 3.3*  CL 98 104 101  CO2 24 16* 25  GLUCOSE 250* 114* 101*  BUN 35* 32* 24*  CREATININE 1.68* 1.41* 1.18*  CALCIUM 8.2* 7.8* 7.4*  MG 1.5*  --   --   AST 18  --  16  ALT 15  --  12  ALKPHOS 129*  --  107  BILITOT 0.8  --  0.6   ------------------------------------------------------------------------------------------------------------------ No results for input(s): CHOL, HDL, LDLCALC, TRIG, CHOLHDL, LDLDIRECT in the last 72 hours.  Lab Results  Component Value Date   HGBA1C 6.9 (H) 06/12/2020    ------------------------------------------------------------------------------------------------------------------ No results for input(s): TSH, T4TOTAL, T3FREE, THYROIDAB in the last 72 hours.  Invalid input(s): FREET3 ------------------------------------------------------------------------------------------------------------------ No results for input(s): VITAMINB12, FOLATE, FERRITIN, TIBC, IRON, RETICCTPCT in the last  72 hours.  Coagulation profile No results for input(s): INR, PROTIME in the last 168 hours.  No results for input(s): DDIMER in the last 72 hours.  Cardiac Enzymes No results for input(s): CKMB, TROPONINI, MYOGLOBIN in the last 168 hours.  Invalid input(s): CK ------------------------------------------------------------------------------------------------------------------    Component Value Date/Time   BNP 177.8 (H) 11/29/2016 1657     Roxan Hockey M.D on 06/13/2020 at 8:22 PM  Go to [email or web address] - for contact info  [facility] - Office  [phone]

## 2020-06-13 NOTE — Progress Notes (Signed)
Initial Nutrition Assessment  DOCUMENTATION CODES:   Obesity unspecified  INTERVENTION:  Boost Breeze po TID, each supplement provides 250 kcal and 9 grams of protein   Nutrition Eduction as indicated  NUTRITION DIAGNOSIS:   Impaired nutrient utilization related to acute illness (diarrhea and weakness) as evidenced by percent weight loss (13% x 4 months, diarrhea the past 4 weeks).   GOAL: Patient will meet greater than or equal to 90% of their needs (preserve lean body mass)  MONITOR:   Diet advancement, PO intake, Weight trends, Supplement acceptance, Labs, I & O's  REASON FOR ASSESSMENT:   Malnutrition Screening Tool    ASSESSMENT: Patient is a 79 yo female with history of COPD, CKD-3, DM2, GERD, HTN, Anemia of chronic disease, HLD.   Presents with complaint of diarrhea x 4 weeks and increased weakness.   CT abdomen/pelvis- thickening of descending and sigmoid colon, mass-hemipelvis, large hiatal hernia.  Liquid diet currently- 360 ml documented. Patient is very sleepy during RD visit and minimally repsonsive to questioning.    Patient has significant weight loss of 15.6 kg (13%) since February.   Medications reviewed and include: protonix, novolog, crestor IVF- D5 with KCL/sodium bicarbonate @100  ml/hr.  IV-Flagyl and Cipro   Labs: BMP Latest Ref Rng & Units 06/13/2020 06/12/2020 06/12/2020  Glucose 70 - 99 mg/dL 101(H) 114(H) 250(H)  BUN 8 - 23 mg/dL 24(H) 32(H) 35(H)  Creatinine 0.44 - 1.00 mg/dL 1.18(H) 1.41(H) 1.68(H)  BUN/Creat Ratio 6 - 22 (calc) - - -  Sodium 135 - 145 mmol/L 134(L) 132(L) 135  Potassium 3.5 - 5.1 mmol/L 3.3(L) 3.7 3.2(L)  Chloride 98 - 111 mmol/L 101 104 98  CO2 22 - 32 mmol/L 25 16(L) 24  Calcium 8.9 - 10.3 mg/dL 7.4(L) 7.8(L) 8.2(L)    NUTRITION - FOCUSED PHYSICAL EXAM:  Unable to complete Nutrition-Focused physical exam at this time.     Diet Order:   Diet Order            Diet full liquid Room service appropriate? Yes; Fluid  consistency: Thin  Diet effective now                EDUCATION NEEDS:  Not appropriate for education at this time (too sleepy)   Skin:  Skin Assessment: Reviewed RN Assessment  Last BM:  6/13  Height:   Ht Readings from Last 1 Encounters:  06/12/20 5\' 6"  (1.676 m)    Weight:   Wt Readings from Last 1 Encounters:  06/12/20 104.1 kg    Ideal Body Weight:   59 kg  BMI:  Body mass index is 37.04 kg/m.  Estimated Nutritional Needs:   Kcal:  3582-5189  Protein:  60-65  Fluid:  >1500 ml daily   Colman Cater MS,RD,CSG,LDN Pager: available through [facility]

## 2020-06-14 ENCOUNTER — Ambulatory Visit (HOSPITAL_COMMUNITY): Payer: Medicare Other | Admitting: Hematology

## 2020-06-14 LAB — CBC WITH DIFFERENTIAL/PLATELET
Abs Immature Granulocytes: 0.49 10*3/uL — ABNORMAL HIGH (ref 0.00–0.07)
Basophils Absolute: 0.1 10*3/uL (ref 0.0–0.1)
Basophils Relative: 0 %
Eosinophils Absolute: 0.4 10*3/uL (ref 0.0–0.5)
Eosinophils Relative: 3 %
HCT: 25.1 % — ABNORMAL LOW (ref 36.0–46.0)
Hemoglobin: 8 g/dL — ABNORMAL LOW (ref 12.0–15.0)
Immature Granulocytes: 4 %
Lymphocytes Relative: 10 %
Lymphs Abs: 1.4 10*3/uL (ref 0.7–4.0)
MCH: 26.8 pg (ref 26.0–34.0)
MCHC: 31.9 g/dL (ref 30.0–36.0)
MCV: 84.2 fL (ref 80.0–100.0)
Monocytes Absolute: 1.6 10*3/uL — ABNORMAL HIGH (ref 0.1–1.0)
Monocytes Relative: 11 %
Neutro Abs: 10 10*3/uL — ABNORMAL HIGH (ref 1.7–7.7)
Neutrophils Relative %: 72 %
Platelets: 415 10*3/uL — ABNORMAL HIGH (ref 150–400)
RBC: 2.98 MIL/uL — ABNORMAL LOW (ref 3.87–5.11)
RDW: 17.3 % — ABNORMAL HIGH (ref 11.5–15.5)
WBC: 13.9 10*3/uL — ABNORMAL HIGH (ref 4.0–10.5)
nRBC: 0 % (ref 0.0–0.2)

## 2020-06-14 LAB — GLUCOSE, CAPILLARY
Glucose-Capillary: 104 mg/dL — ABNORMAL HIGH (ref 70–99)
Glucose-Capillary: 156 mg/dL — ABNORMAL HIGH (ref 70–99)
Glucose-Capillary: 115 mg/dL — ABNORMAL HIGH (ref 70–99)
Glucose-Capillary: 165 mg/dL — ABNORMAL HIGH (ref 70–99)

## 2020-06-14 LAB — BASIC METABOLIC PANEL
Anion gap: 8 (ref 5–15)
BUN: 22 mg/dL (ref 8–23)
CO2: 29 mmol/L (ref 22–32)
Calcium: 7.3 mg/dL — ABNORMAL LOW (ref 8.9–10.3)
Chloride: 98 mmol/L (ref 98–111)
Creatinine, Ser: 1.21 mg/dL — ABNORMAL HIGH (ref 0.44–1.00)
GFR calc Af Amer: 50 mL/min — ABNORMAL LOW (ref >60)
GFR calc non Af Amer: 43 mL/min — ABNORMAL LOW (ref >60)
Glucose, Bld: 99 mg/dL (ref 70–99)
Potassium: 3.2 mmol/L — ABNORMAL LOW (ref 3.5–5.1)
Sodium: 135 mmol/L (ref 135–145)

## 2020-06-14 MED ORDER — POTASSIUM CHLORIDE CRYS ER 20 MEQ PO TBCR
40.0000 meq | EXTENDED_RELEASE_TABLET | ORAL | Status: AC
  Administered 2020-06-14 (×2): 40 meq via ORAL
  Filled 2020-06-14 (×2): qty 2

## 2020-06-14 NOTE — TOC Initial Note (Signed)
Transition of Care [facility]) - Initial/Assessment Note    Patient Details  Name: [name] MRN: [id number] Date of Birth: [birth year]  [facility]) CM/SW Contact:    Natasha Bence, LCSW Phone Number: 06/14/2020, 3:06 PM  Clinical Narrative:       Patient is a 79 year old female admitted for acute colitis. Patient's daughter reported that the patient lives in a single family home by her self. and only utilizes a walker for DME equipment. The patient's daughter reported that she is typically able to preform all ADL's independently. Patients daughter expressed that if SNF is recommended by PT, the preferred agency would be the [facility]. The Patient's daughter reported that they currently do not have any preference for [facility] if recommended.              Expected Discharge Plan: Home/Self Care Barriers to Discharge: Continued Medical Work up     Expected Discharge Plan and Services Expected Discharge Plan: Home/Self Care       Living arrangements for the past 2 months: Single Family Home                      Prior Living Arrangements/Services Living arrangements for the past 2 months: Single Family Home Lives with:: Self Patient language and need for interpreter reviewed:: Yes Do you feel safe going back to the place where you live?: Yes      Need for Family Participation in Patient Care: Yes (Comment) Care giver support system in place?: Yes (comment) Current home services: DME [facility]) Criminal Activity/Legal Involvement Pertinent to Current Situation/Hospitalization: No - Comment as needed  Activities of Daily Living Home Assistive Devices/Equipment: CBG Meter, Walker (specify type) ADL Screening (condition at time of admission) Patient's cognitive ability adequate to safely complete daily activities?: Yes Is the patient deaf or have difficulty hearing?: No Does the patient have difficulty seeing, even when wearing glasses/contacts?: No Does the patient have  difficulty concentrating, remembering, or making decisions?: Yes Patient able to express need for assistance with ADLs?: Yes Does the patient have difficulty dressing or bathing?: Yes Independently performs ADLs?: No Communication: Independent Dressing (OT): Needs assistance Is this a change from baseline?: Pre-admission baseline Grooming: Independent Feeding: Independent Bathing: Needs assistance Is this a change from baseline?: Pre-admission baseline Toileting: Needs assistance Is this a change from baseline?: Pre-admission baseline In/Out Bed: Needs assistance Is this a change from baseline?: Pre-admission baseline Walks in Home: Needs assistance Is this a change from baseline?: Pre-admission baseline Does the patient have difficulty walking or climbing stairs?: Yes Weakness of Legs: Both Weakness of Arms/Hands: None  Permission Sought/Granted      Share Information with NAME: [name]     Permission granted to share info w Relationship: Daughter     Emotional Assessment       Orientation: : Oriented to Self, Oriented to Place, Oriented to  Time, Oriented to Situation Alcohol / Substance Use: Not Applicable Psych Involvement: No (comment)  Admission diagnosis:  Hypokalemia [E87.6] Colitis [K52.9] Acute colitis [K52.9] AKI (acute kidney injury) ([place]) [N17.9] Patient Active Problem List   Diagnosis Date Noted  . Acute colitis 06/12/2020  . Hypokalemia 06/12/2020  . AKI (acute kidney injury) ([place]) 06/12/2020  . Hypomagnesemia 06/12/2020  . Colitis 06/12/2020  . Osteoarthritis of both knees 05/24/2020  . Elevated alkaline phosphatase measurement 10/21/2019  . Back pain 07/05/2019  . Rectal bleeding 01/08/2019  . Constipation 01/08/2019  . Nausea and vomiting  11/03/2018  . Trigger finger, right index finger   . Monoclonal gammopathy of unknown significance (MGUS) 11/26/2017  . Memory loss of unknown cause 11/10/2017  . Chronic left shoulder pain 09/14/2017   . Encounter for pain management 02/09/2017  . Acute maxillary sinusitis 04/11/2016  . Anemia in chronic renal disease 12/30/2015  . Morbid obesity ([place]) 04/18/2015  . Myasthenia gravis in remission ([place]) 11/24/2014  . Myasthenia gravis ([place]) 11/16/2014  . Vitamin D deficiency 05/24/2014  . Osteopenia 04/26/2014  . Macular hole of left eye 01/19/2014  . Generalized osteoarthritis 05/25/2013  . Esophageal dysphagia 04/03/2012  . Abnormal chest CT 03/02/2012  . Insomnia 01/02/2012  . Malignant hypertension 05/30/2011  . Schatzki's ring 05/07/2011  . INGROWN TOENAIL 01/10/2011  . Unspecified glaucoma 08/08/2010  . DYSCHROMIA, UNSPECIFIED 08/08/2010  . CKD stage 3 due to type 2 diabetes mellitus ([place]) 09/14/2009  . HIP PAIN, LEFT 06/09/2009  . Urinary incontinence 06/09/2009  . IDA (iron deficiency anemia) 10/02/2008  . Hypothyroidism 09/27/2008  . Hyperlipemia 06/30/2008  . Depression with anxiety 06/30/2008  . GERD 06/30/2008  . Obstructive sleep apnea 06/30/2008  . CARPAL TUNNEL SYNDROME 05/19/2008  . SHOULDER PAIN 02/02/2008  . IMPINGEMENT SYNDROME 02/02/2008  . DEGENERATIVE JOINT DISEASE, KNEE 11/24/2007  . Type 2 diabetes mellitus ([place]) 09/09/2007   PCP:  Fayrene Helper, MD Pharmacy:   [facility], [place] [address] [place] [address] Phone: [phone] Fax: [phone]      Readmission Risk Interventions No flowsheet data found.

## 2020-06-14 NOTE — Progress Notes (Signed)
Patient Demographics:    [name], is a 79 y.o. female, DOB - [birth year], NTZ:[id number]  Admit date - 06/11/2020   Admitting Physician Kathie Dike, MD  Outpatient Primary MD for the patient is Fayrene Helper, MD  LOS - 2   Chief Complaint  Patient presents with  . Diarrhea        Subjective:    [name] today has no fevers, no emesis,  No chest pain,   Diarrhea persist,  Assessment  & Plan :    Principal Problem:   Acute colitis Active Problems:   Hypothyroidism   Type 2 diabetes mellitus (HCC)   Hyperlipemia   IDA (iron deficiency anemia)   Depression with anxiety   Unspecified glaucoma   GERD   CKD stage 3 due to type 2 diabetes mellitus ([place])   Obstructive sleep apnea   Myasthenia gravis in remission ([place])   Hypokalemia   AKI (acute kidney injury) ([place])   Hypomagnesemia   Colitis  1. Acute colitis--- abdominal discomfort nausea and diarrhea persist, continue IV Cipro and Flagyl -Patient will need outpatient GI follow-up for colonoscopy once acute issues have resolved.  Continue on liquid diet for now.  GI pathogen panel in process. -WBC 13.9 down from 18.0 on 06/12/20  2)ABLA---??? is down to 8.0 from 11.2 query acute blood loss versus hemodilution   3)Acute kidney injury superimposed on CKD stage IIIa--- Likely secondary to volume depletion from GI losses.    Creatinine is down to 1.2 from 1.68 after hydration chronic kidney disease stage III.     4)Myasthenia gravis.  Continue on pyridostigmine 3 times daily.  She is also on chronic prednisone  5)Diabetes mellitus type 2.  P.o. intake has been poor and she has had episodes of hypoglycemia.  Hold oral agents and basal insulin onto oral intake is more reliable Continue on sliding scale insulin.   6)Hyperlipidemia.  Continue Crestor and Zetia  7)Hypothyroidism.  Continue on Synthroid  8)Non-anion gap  metabolic acidosis.  Likely related to GI losses.  Resolved with bicarbonate infusion.  9)Right pelvic cystic mass, likely ovarian.  Incidental finding on CT.  Will need outpatient ovarian ultrasound once acute issues have resolved.---Complex cystic mass in the right hemipelvis likely associated with the right ovary measuring up to 3.9 cm. Follow-up with a nonemergent outpatient pelvic ultrasound is recommended. --- Needs Pelvic ultrasound and colonoscopy  10)Findings are concerning for an infectious or inflammatory colitis.. As an underlying mass cannot be excluded, a follow-up colonoscopy is recommended as an outpatient  11)Ulcer in Perineum--Lt > Rt--- looks like pressure ulcers from thighs rubbing together--- topical wound care  Status is: Inpatient  Remains inpatient appropriate because:IV treatments appropriate due to intensity of illness or inability to take PO and Inpatient level of care appropriate due to severity of illness   Disposition: The patient is from: Home              Anticipated d/c is to: Home              Anticipated d/c date is: 2 days              Patient currently is not medically stable to d/c. Barriers: Not Clinically Stable- -requires IV antibiotics  Code Status : full  Family Communication:  (patient is alert, awake and coherent)   Consults  :  na  DVT Prophylaxis  :  - SCDs (hgb dropped)  Lab Results  Component Value Date   PLT 415 (H) 06/14/2020    Inpatient Medications  Scheduled Meds: . brimonidine  1 drop Both Eyes Q12H   And  . timolol  1 drop Both Eyes Q12H  . citalopram  10 mg Oral Daily  . cycloSPORINE  1 drop Both Eyes BID  . darifenacin  15 mg Oral Daily  . ezetimibe  10 mg Oral Daily  . gabapentin  300 mg Oral BID  . insulin aspart  0-20 Units Subcutaneous TID WC  . multivitamin with minerals  1 tablet Oral Daily  . pantoprazole  40 mg Oral Daily  . predniSONE  5 mg Oral Daily  . pyridostigmine  60 mg Oral TID  .  rosuvastatin  40 mg Oral q1800  . venlafaxine XR  150 mg Oral Daily   Continuous Infusions: . ciprofloxacin 400 mg (06/14/20 1844)  . dextrose 5 % 1,000 mL with potassium chloride 20 mEq, sodium bicarbonate 150 mEq infusion 50 mL/hr at 06/14/20 0907  . metronidazole Stopped (06/14/20 1409)   PRN Meds:.acetaminophen **OR** acetaminophen, HYDROcodone-acetaminophen, prochlorperazine    Anti-infectives (From admission, onward)   Start     Dose/Rate Route Frequency Ordered Stop   06/12/20 1600  ciprofloxacin (CIPRO) IVPB 400 mg     Discontinue     400 mg 200 mL/hr over 60 Minutes Intravenous Every 12 hours 06/12/20 0515     06/12/20 1400  metroNIDAZOLE (FLAGYL) IVPB 500 mg     Discontinue     500 mg 100 mL/hr over 60 Minutes Intravenous Every 8 hours 06/12/20 0535     06/12/20 0530  metroNIDAZOLE (FLAGYL) IVPB 500 mg  Status:  Discontinued        500 mg 100 mL/hr over 60 Minutes Intravenous Every 8 hours 06/12/20 0515 06/12/20 0535   06/12/20 0500  ciprofloxacin (CIPRO) IVPB 400 mg        400 mg 200 mL/hr over 60 Minutes Intravenous  Once 06/12/20 0447 06/12/20 0639   06/12/20 0500  metroNIDAZOLE (FLAGYL) IVPB 500 mg        500 mg 100 mL/hr over 60 Minutes Intravenous  Once 06/12/20 0447 06/12/20 0639        Objective:   Vitals:   06/13/20 2217 06/14/20 0011 06/14/20 0616 06/14/20 1239  BP: (!) 93/53 (!) 101/41 (!) 100/40 (!) 93/50  Pulse: 84 79 74 72  Resp: 20 20 20 15   Temp: 99.1 F (37.3 C) 99.1 F (37.3 C) 99.5 F (37.5 C) 98.5 F (36.9 C)  TempSrc: Oral Oral Oral Oral  SpO2: 97% 96% 92% 100%  Weight:      Height:        Wt Readings from Last 3 Encounters:  06/12/20 104.1 kg  05/20/20 106.6 kg  04/29/20 106.7 kg     Intake/Output Summary (Last 24 hours) at 06/14/2020 1951 Last data filed at 06/14/2020 1844 Gross per 24 hour  Intake 1491.57 ml  Output --  Net 1491.57 ml     Physical Exam  Gen:- Awake Alert, in no acute distress HEENT:- [place].AT, No sclera  icterus Neck-Supple Neck,No JVD,.  Lungs-  CTAB , fair symmetrical air movement CV- S1, S2 normal, regular  Abd-  +ve B.Sounds, Abd Soft, No tenderness,    Extremity/Skin:- No  edema, pedal pulses  present  Psych-affect is appropriate, oriented x3 Neuro-generalized weakness, no new focal deficits, no tremors GU- Ulcer in Perineum--Lt > Rt--- looks like pressure ulcers from thighs rubbing together   Data Review:   Micro Results Recent Results (from the past 240 hour(s))  Gastrointestinal Panel by PCR , Stool     Status: None   Collection Time: 06/12/20 12:50 AM   Specimen: Rectum; Stool  Result Value Ref Range Status   Campylobacter species NOT DETECTED NOT DETECTED Final   Plesimonas shigelloides NOT DETECTED NOT DETECTED Final   Salmonella species NOT DETECTED NOT DETECTED Final   Yersinia enterocolitica NOT DETECTED NOT DETECTED Final   Vibrio species NOT DETECTED NOT DETECTED Final   Vibrio cholerae NOT DETECTED NOT DETECTED Final   Enteroaggregative E coli (EAEC) NOT DETECTED NOT DETECTED Final   Enteropathogenic E coli (EPEC) NOT DETECTED NOT DETECTED Final   Enterotoxigenic E coli (ETEC) NOT DETECTED NOT DETECTED Final   Shiga like toxin producing E coli (STEC) NOT DETECTED NOT DETECTED Final   Shigella/Enteroinvasive E coli (EIEC) NOT DETECTED NOT DETECTED Final   Cryptosporidium NOT DETECTED NOT DETECTED Final   Cyclospora cayetanensis NOT DETECTED NOT DETECTED Final   Entamoeba histolytica NOT DETECTED NOT DETECTED Final   Giardia lamblia NOT DETECTED NOT DETECTED Final   Adenovirus F40/41 NOT DETECTED NOT DETECTED Final   Astrovirus NOT DETECTED NOT DETECTED Final   Norovirus GI/GII NOT DETECTED NOT DETECTED Final   Rotavirus A NOT DETECTED NOT DETECTED Final   Sapovirus (I, II, IV, and V) NOT DETECTED NOT DETECTED Final    Comment: Performed at [facility], [address] [place], [place] [address]  SARS Coronavirus 2 by RT PCR (hospital order, performed  in [place] hospital lab) Nasopharyngeal Nasopharyngeal Swab     Status: None   Collection Time: 06/12/20  4:48 AM   Specimen: Nasopharyngeal Swab  Result Value Ref Range Status   SARS Coronavirus 2 NEGATIVE NEGATIVE Final    Comment: (NOTE) SARS-CoV-2 target nucleic acids are NOT DETECTED.  The SARS-CoV-2 RNA is generally detectable in upper and lower respiratory specimens during the acute phase of infection. The lowest concentration of SARS-CoV-2 viral copies this assay can detect is 250 copies / mL. A negative result does not preclude SARS-CoV-2 infection and should not be used as the sole basis for treatment or other patient management decisions.  A negative result may occur with improper specimen collection / handling, submission of specimen other than nasopharyngeal swab, presence of viral mutation(s) within the areas targeted by this assay, and inadequate number of viral copies (<250 copies / mL). A negative result must be combined with clinical observations, patient history, and epidemiological information.  Fact Sheet for Patients:   StrictlyIdeas.no  Fact Sheet for Healthcare Providers: BankingDealers.co.za  This test is not yet approved or  cleared by the [place] FDA and has been authorized for detection and/or diagnosis of SARS-CoV-2 by FDA under an Emergency Use Authorization (EUA).  This EUA will remain in effect (meaning this test can be used) for the duration of the COVID-19 declaration under Section 564(b)(1) of the Act, 21 U.S.C. section 360bbb-3(b)(1), unless the authorization is terminated or revoked sooner.  Performed at [facility], [address], [place],  [address]     Radiology Reports CT ABDOMEN PELVIS WO CONTRAST  Result Date: 06/12/2020 CLINICAL DATA:  Left lower quadrant abdominal pain. EXAM: CT ABDOMEN AND PELVIS WITHOUT CONTRAST TECHNIQUE: Multidetector CT imaging of the abdomen and  pelvis was performed following the standard protocol without IV contrast. COMPARISON:  None. FINDINGS: Lower chest: The lung bases are clear. The heart size is normal. Hepatobiliary: The liver is normal. Status post cholecystectomy.There is no biliary ductal dilation. Pancreas: Normal contours without ductal dilatation. No peripancreatic fluid collection. Spleen: Unremarkable. Adrenals/Urinary Tract: --Adrenal glands: Unremarkable. --Right kidney/ureter: There is a nonobstructing stone in the upper pole the right kidney. --Left kidney/ureter: No hydronephrosis or radiopaque kidney stones. --Urinary bladder: Unremarkable. Stomach/Bowel: --Stomach/Duodenum: There is a large hiatal hernia. --Small bowel: Unremarkable. --Colon: There is diffuse circumferential wall thickening of much of the descending colon and sigmoid colon. 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 There are enlarged adjacent regional lymph nodes. There is a moderate amount of stool in the remaining portions of the colon. There may be a single inflamed diverticulum in the ascending colon (axial series 2, image 37). --Appendix: Not visualized. No right lower quadrant inflammation or free fluid. Vascular/Lymphatic: Atherosclerotic calcification is present within the non-aneurysmal abdominal aorta, without hemodynamically significant stenosis. --No retroperitoneal lymphadenopathy. --there are mildly enlarged mesenteric lymph nodes. There are multiple enlarged regional lymph nodes along the course of the descending colon. --No pelvic or inguinal lymphadenopathy. Reproductive: There is a complex cystic mass in the right hemipelvis likely associated with the right ovary measuring 3.9 x 2.6 cm (axial series 2, image 69). There is a small cystic structure involving the left ovary measuring approximately 1.8 cm. The patient is status post prior hysterectomy. Other: No ascites or free air. The abdominal wall is normal. Musculoskeletal. No acute displaced  fractures. IMPRESSION: 1. Diffuse circumferential wall thickening of much of the descending colon and sigmoid colon with multiple enlarged adjacent regional lymph nodes. Findings are concerning for an infectious or inflammatory colitis. Correlation with patient's symptoms and laboratory studies is recommended. As an underlying mass cannot be excluded, a follow-up colonoscopy is recommended as an outpatient. 2. Complex cystic mass in the right hemipelvis likely associated with the right ovary measuring up to 3.9 cm. Follow-up with a nonemergent outpatient pelvic ultrasound is recommended. 3. Large hiatal hernia. 4. Nonobstructive right nephrolithiasis. Aortic Atherosclerosis (ICD10-I70.0). Electronically Signed   By: Constance Holster M.D.   On: 06/12/2020 04:00     CBC Recent Labs  Lab 06/12/20 0020 06/12/20 0915 06/13/20 0537 06/14/20 0636  WBC 17.7* 18.0* 15.3* 13.9*  HGB 9.1* 9.2* 7.9* 8.0*  HCT 29.0* 29.9* 24.2* 25.1*  PLT 560* 576* 453* 415*  MCV 84.8 89.0 82.6 84.2  MCH 26.6 27.4 27.0 26.8  MCHC 31.4 30.8 32.6 31.9  RDW 17.6* 18.3* 17.0* 17.3*  LYMPHSABS 0.9 0.8 1.5 1.4  MONOABS 1.4* 1.5* 1.6* 1.6*  EOSABS 0.2 0.2 0.5 0.4  BASOSABS 0.1 0.1 0.0 0.1    Chemistries  Recent Labs  Lab 06/12/20 0020 06/12/20 0900 06/13/20 0537 06/14/20 0636  NA 135 132* 134* 135  K 3.2* 3.7 3.3* 3.2*  CL 98 104 101 98  CO2 24 16* 25 29  GLUCOSE 250* 114* 101* 99  BUN 35* 32* 24* 22  CREATININE 1.68* 1.41* 1.18* 1.21*  CALCIUM 8.2* 7.8* 7.4* 7.3*  MG 1.5*  --   --   --   AST 18  --  16  --   ALT 15  --  12  --   ALKPHOS 129*  --  107  --   BILITOT 0.8  --  0.6  --    ------------------------------------------------------------------------------------------------------------------ No results for input(s): CHOL, HDL, LDLCALC, TRIG, CHOLHDL, LDLDIRECT in the last 72  hours.  Lab Results  Component Value Date   HGBA1C 6.9 (H) 06/12/2020    ------------------------------------------------------------------------------------------------------------------ No results for input(s): TSH, T4TOTAL, T3FREE, THYROIDAB in the last 72 hours.  Invalid input(s): FREET3 ------------------------------------------------------------------------------------------------------------------ No results for input(s): VITAMINB12, FOLATE, FERRITIN, TIBC, IRON, RETICCTPCT in the last 72 hours.  Coagulation profile No results for input(s): INR, PROTIME in the last 168 hours.  No results for input(s): DDIMER in the last 72 hours.  Cardiac Enzymes No results for input(s): CKMB, TROPONINI, MYOGLOBIN in the last 168 hours.  Invalid input(s): CK ------------------------------------------------------------------------------------------------------------------    Component Value Date/Time   BNP 177.8 (H) 11/29/2016 1657     Roxan Hockey M.D on 06/14/2020 at 7:51 PM  Go to [email or web address] - for contact info  [facility] - Office  [phone]

## 2020-06-15 LAB — CBC WITH DIFFERENTIAL/PLATELET
Abs Immature Granulocytes: 0.56 10*3/uL — ABNORMAL HIGH (ref 0.00–0.07)
Basophils Absolute: 0.1 10*3/uL (ref 0.0–0.1)
Basophils Relative: 0 %
Eosinophils Absolute: 0.4 10*3/uL (ref 0.0–0.5)
Eosinophils Relative: 2 %
HCT: 24.8 % — ABNORMAL LOW (ref 36.0–46.0)
Hemoglobin: 8 g/dL — ABNORMAL LOW (ref 12.0–15.0)
Immature Granulocytes: 4 %
Lymphocytes Relative: 9 %
Lymphs Abs: 1.4 10*3/uL (ref 0.7–4.0)
MCH: 26.6 pg (ref 26.0–34.0)
MCHC: 32.3 g/dL (ref 30.0–36.0)
MCV: 82.4 fL (ref 80.0–100.0)
Monocytes Absolute: 1.7 10*3/uL — ABNORMAL HIGH (ref 0.1–1.0)
Monocytes Relative: 11 %
Neutro Abs: 11.8 10*3/uL — ABNORMAL HIGH (ref 1.7–7.7)
Neutrophils Relative %: 74 %
Platelets: 409 10*3/uL — ABNORMAL HIGH (ref 150–400)
RBC: 3.01 MIL/uL — ABNORMAL LOW (ref 3.87–5.11)
RDW: 17.4 % — ABNORMAL HIGH (ref 11.5–15.5)
WBC: 15.9 10*3/uL — ABNORMAL HIGH (ref 4.0–10.5)
nRBC: 0 % (ref 0.0–0.2)

## 2020-06-15 LAB — GLUCOSE, CAPILLARY
Glucose-Capillary: 63 mg/dL — ABNORMAL LOW (ref 70–99)
Glucose-Capillary: 68 mg/dL — ABNORMAL LOW (ref 70–99)
Glucose-Capillary: 98 mg/dL (ref 70–99)
Glucose-Capillary: 121 mg/dL — ABNORMAL HIGH (ref 70–99)

## 2020-06-15 LAB — RENAL FUNCTION PANEL
Albumin: 1.6 g/dL — ABNORMAL LOW (ref 3.5–5.0)
Anion gap: 8 (ref 5–15)
BUN: 20 mg/dL (ref 8–23)
CO2: 29 mmol/L (ref 22–32)
Calcium: 7.6 mg/dL — ABNORMAL LOW (ref 8.9–10.3)
Chloride: 99 mmol/L (ref 98–111)
Creatinine, Ser: 1.12 mg/dL — ABNORMAL HIGH (ref 0.44–1.00)
GFR calc Af Amer: 55 mL/min — ABNORMAL LOW (ref >60)
GFR calc non Af Amer: 47 mL/min — ABNORMAL LOW (ref >60)
Glucose, Bld: 80 mg/dL (ref 70–99)
Phosphorus: 1.6 mg/dL — ABNORMAL LOW (ref 2.5–4.6)
Potassium: 4.6 mmol/L (ref 3.5–5.1)
Sodium: 136 mmol/L (ref 135–145)

## 2020-06-15 LAB — C DIFFICILE QUICK SCREEN W PCR REFLEX
C Diff antigen: POSITIVE — AB
C Diff toxin: NEGATIVE

## 2020-06-15 MED ORDER — INSULIN ASPART 100 UNIT/ML ~~LOC~~ SOLN
0.0000 [IU] | Freq: Every day | SUBCUTANEOUS | Status: DC
  Administered 2020-06-18 – 2020-06-19 (×2): 2 [IU] via SUBCUTANEOUS

## 2020-06-15 MED ORDER — INSULIN ASPART 100 UNIT/ML ~~LOC~~ SOLN
0.0000 [IU] | Freq: Three times a day (TID) | SUBCUTANEOUS | Status: DC
  Administered 2020-06-17: 1 [IU] via SUBCUTANEOUS
  Administered 2020-06-17: 0 [IU] via SUBCUTANEOUS
  Administered 2020-06-18 – 2020-06-19 (×2): 1 [IU] via SUBCUTANEOUS
  Administered 2020-06-19: 2 [IU] via SUBCUTANEOUS

## 2020-06-15 MED ORDER — BISMUTH SUBSALICYLATE 262 MG/15ML PO SUSP
30.0000 mL | ORAL | Status: DC | PRN
  Filled 2020-06-15: qty 118

## 2020-06-15 MED ORDER — WHITE PETROLATUM EX OINT
1.0000 "application " | TOPICAL_OINTMENT | CUTANEOUS | Status: DC | PRN
  Filled 2020-06-15: qty 28.35

## 2020-06-15 MED ORDER — NYSTATIN 100000 UNIT/GM EX POWD
Freq: Three times a day (TID) | CUTANEOUS | Status: DC
  Filled 2020-06-15 (×2): qty 15

## 2020-06-15 NOTE — Progress Notes (Signed)
[facility] night shift.  The patient has been having frequent loose stools.  The nursing staff reports that she has a superficial open sore in the perineal area due to frequent irritation induced by the diarrhea.  Pepto-Bismol as needed, topical Vaseline and nystatin powder for inguinal and perineal area ordered.  Tennis Must, MD

## 2020-06-15 NOTE — Progress Notes (Signed)
Inpatient Diabetes Program Recommendations  AACE/ADA: New Consensus Statement on Inpatient Glycemic Control (2015)  Target Ranges:  Prepandial:   less than 140 mg/dL      Peak postprandial:   less than 180 mg/dL (1-2 hours)      Critically ill patients:  140 - 180 mg/dL   Lab Results  Component Value Date   GLUCAP 68 (L) 06/15/2020   HGBA1C 6.9 (H) 06/12/2020    Review of Glycemic Control Results for [name], [name] (MRN [id number]) as of 06/15/2020 12:14  Ref. Range 06/14/2020 12:38 06/14/2020 18:12 06/14/2020 20:35 06/15/2020 07:43 06/15/2020 11:11  Glucose-Capillary Latest Ref Range: 70 - 99 mg/dL 156 (H) 115 (H) 165 (H) 63 (L) 68 (L)   Diabetes history: DM2 Outpatient Diabetes medications: Lantus 30 QD + Tradjenta 5 mg QD  Current orders for Inpatient glycemic control: Novolog 0-20 units tid correction  Inpatient Diabetes Program Recommendations:   -Decrease Novolog correction to sensitive 0-9  Thank you, Bethena Roys E. Davanta Meuser, RN, MSN, CDE  Diabetes Coordinator Inpatient Glycemic Control Team Team Pager [phone] (8am-5pm) 06/15/2020 12:28 PM

## 2020-06-15 NOTE — Progress Notes (Signed)
Patient Demographics:    [name], is a 79 y.o. female, DOB - [birth year], XVQ:[id number]  Admit date - 06/11/2020   Admitting Physician Kathie Dike, MD  Outpatient Primary MD for the patient is Fayrene Helper, MD  LOS - 3  Chief Complaint  Patient presents with  . Diarrhea        Subjective:    [name] today has no fevers,  No chest pain,   At least-- 4 episodes of large-volume watery stools in the last 16 hours  -Nausea but no emesis, or intake and appetite remains poor  Assessment  & Plan :    Principal Problem:   Acute colitis Active Problems:   Hypothyroidism   Type 2 diabetes mellitus (HCC)   Hyperlipemia   IDA (iron deficiency anemia)   Depression with anxiety   Unspecified glaucoma   GERD   CKD stage 3 due to type 2 diabetes mellitus (HCC)   Obstructive sleep apnea   Myasthenia gravis in remission (HCC)   Hypokalemia   AKI (acute kidney injury) ([place])   Hypomagnesemia   Colitis   1)Acute colitis--- abdominal discomfort nausea and diarrhea persist, -CT abdomen and pelvis on admission with significant colitis -Currently on IV Cipro and Flagyl since 06/29/2020 -Patient will need outpatient GI follow-up for colonoscopy once acute issues have resolved. -Advance diet to soft -GI pathogen from 06/12/20 is  negative -C diff was NOT tested- Discussed with ID physician  Dr. Lorriane Shire to get approval for C. difficile testing for this particular patient given persistent diarrhea, persistent leukocytosis -C. difficile test was ordered initially on admission but sample was never collected -WBC 15.9 down from 18.0 on 06/12/20  2)ABLA---??? is down to 8.0 from 11.2 query acute blood loss versus hemodilution -Outpatient colonoscopy planned  3)Acute kidney injury superimposed on CKD stage IIIa--- Likely secondary to volume depletion from GI losses.    Creatinine is  down to 1.1 from 1.68 after hydration chronic kidney disease stage III.     4)Myasthenia gravis---  Continue on pyridostigmine 3 times daily.  She is also on chronic prednisone  5)Diabetes mellitus type 2--  P.o. intake has been poor and she has had episodes of hypoglycemia.  Hold oral agents and basal insulin onto oral intake is more reliable ---Continue on sliding scale insulin.  6)Hyperlipidemia.  Continue Crestor and Zetia  7)Hypothyroidism.  Continue on Synthroid  8)Non-anion gap metabolic acidosis.  Likely related to GI losses.  Resolved with bicarbonate infusion.  9)Right pelvic cystic mass, likely ovarian.  Incidental finding on CT.  Will need outpatient ovarian ultrasound once acute issues have resolved.---Complex cystic mass in the right hemipelvis likely associated with the right ovary measuring up to 3.9 cm. Follow-up with a nonemergent outpatient pelvic ultrasound is recommended. --- Needs Pelvic ultrasound and colonoscopy  10)Findings are concerning for an infectious or inflammatory colitis.. As an underlying mass cannot be excluded, a follow-up colonoscopy is recommended as an outpatient  11)Ulcer in Perineum--Lt > Rt--- looks like pressure ulcers from thighs rubbing together--- topical wound care  Status is: Inpatient  Remains inpatient appropriate because:IV treatments appropriate due to intensity of illness or inability to take PO and Inpatient level of care appropriate due to severity of illness   Disposition:  The patient is from: Home              Anticipated d/c is to: Home              Anticipated d/c date is: 2 days              Patient currently is not medically stable to d/c. Barriers: Not Clinically Stable- -requires IV antibiotics, diarrhea persist with risk for electrolyte abnormalities and dehydration  Code Status : full  Family Communication:  (patient is alert, awake and coherent)   Consults  :  na  DVT Prophylaxis  :  - SCDs (hgb dropped)  Lab  Results  Component Value Date   PLT 409 (H) 06/15/2020    Inpatient Medications  Scheduled Meds: . brimonidine  1 drop Both Eyes Q12H   And  . timolol  1 drop Both Eyes Q12H  . citalopram  10 mg Oral Daily  . cycloSPORINE  1 drop Both Eyes BID  . darifenacin  15 mg Oral Daily  . ezetimibe  10 mg Oral Daily  . gabapentin  300 mg Oral BID  . insulin aspart  0-20 Units Subcutaneous TID WC  . multivitamin with minerals  1 tablet Oral Daily  . nystatin   Topical TID  . pantoprazole  40 mg Oral Daily  . predniSONE  5 mg Oral Daily  . pyridostigmine  60 mg Oral TID  . rosuvastatin  40 mg Oral q1800  . venlafaxine XR  150 mg Oral Daily   Continuous Infusions: . ciprofloxacin 400 mg (06/15/20 0459)  . dextrose 5 % 1,000 mL with potassium chloride 20 mEq, sodium bicarbonate 150 mEq infusion 20 mL/hr at 06/14/20 2144  . metronidazole 500 mg (06/15/20 1524)   PRN Meds:.acetaminophen **OR** acetaminophen, bismuth subsalicylate, HYDROcodone-acetaminophen, prochlorperazine, white petrolatum    Anti-infectives (From admission, onward)   Start     Dose/Rate Route Frequency Ordered Stop   06/12/20 1600  ciprofloxacin (CIPRO) IVPB 400 mg     Discontinue     400 mg 200 mL/hr over 60 Minutes Intravenous Every 12 hours 06/12/20 0515     06/12/20 1400  metroNIDAZOLE (FLAGYL) IVPB 500 mg     Discontinue     500 mg 100 mL/hr over 60 Minutes Intravenous Every 8 hours 06/12/20 0535     06/12/20 0530  metroNIDAZOLE (FLAGYL) IVPB 500 mg  Status:  Discontinued        500 mg 100 mL/hr over 60 Minutes Intravenous Every 8 hours 06/12/20 0515 06/12/20 0535   06/12/20 0500  ciprofloxacin (CIPRO) IVPB 400 mg        400 mg 200 mL/hr over 60 Minutes Intravenous  Once 06/12/20 0447 06/12/20 0639   06/12/20 0500  metroNIDAZOLE (FLAGYL) IVPB 500 mg        500 mg 100 mL/hr over 60 Minutes Intravenous  Once 06/12/20 0447 06/12/20 0639        Objective:   Vitals:   06/14/20 1239 06/14/20 2034 06/15/20  0447 06/15/20 1518  BP: (!) 93/50 (!) 104/48 (!) 97/42 (!) 123/53  Pulse: 72 71 67 73  Resp: 15 20 20 15   Temp: 98.5 F (36.9 C) 98.6 F (37 C) 98.4 F (36.9 C) 98 F (36.7 C)  TempSrc: Oral Oral Oral Oral  SpO2: 100% 98% 97% 96%  Weight:      Height:        Wt Readings from Last 3 Encounters:  06/12/20 104.1 kg  05/20/20 106.6  kg  04/29/20 106.7 kg     Intake/Output Summary (Last 24 hours) at 06/15/2020 1718 Last data filed at 06/15/2020 0547 Gross per 24 hour  Intake 1348.27 ml  Output --  Net 1348.27 ml     Physical Exam  Gen:- Awake Alert, in no acute distress HEENT:- [place].AT, No sclera icterus Neck-Supple Neck,No JVD,.  Lungs-  CTAB , fair symmetrical air movement CV- S1, S2 normal, regular  Abd-  +ve B.Sounds, Abd Soft, abdominal discomfort on palpation without significant tenderness per se    Extremity/Skin:- No  edema, pedal pulses present  Psych-affect is appropriate, oriented x3 Neuro-generalized weakness, no new focal deficits, no tremors GU- Ulcer in Perineum--Lt > Rt--- looks like pressure ulcers from thighs rubbing together   Data Review:   Micro Results Recent Results (from the past 240 hour(s))  Gastrointestinal Panel by PCR , Stool     Status: None   Collection Time: 06/12/20 12:50 AM   Specimen: Rectum; Stool  Result Value Ref Range Status   Campylobacter species NOT DETECTED NOT DETECTED Final   Plesimonas shigelloides NOT DETECTED NOT DETECTED Final   Salmonella species NOT DETECTED NOT DETECTED Final   Yersinia enterocolitica NOT DETECTED NOT DETECTED Final   Vibrio species NOT DETECTED NOT DETECTED Final   Vibrio cholerae NOT DETECTED NOT DETECTED Final   Enteroaggregative E coli (EAEC) NOT DETECTED NOT DETECTED Final   Enteropathogenic E coli (EPEC) NOT DETECTED NOT DETECTED Final   Enterotoxigenic E coli (ETEC) NOT DETECTED NOT DETECTED Final   Shiga like toxin producing E coli (STEC) NOT DETECTED NOT DETECTED Final    Shigella/Enteroinvasive E coli (EIEC) NOT DETECTED NOT DETECTED Final   Cryptosporidium NOT DETECTED NOT DETECTED Final   Cyclospora cayetanensis NOT DETECTED NOT DETECTED Final   Entamoeba histolytica NOT DETECTED NOT DETECTED Final   Giardia lamblia NOT DETECTED NOT DETECTED Final   Adenovirus F40/41 NOT DETECTED NOT DETECTED Final   Astrovirus NOT DETECTED NOT DETECTED Final   Norovirus GI/GII NOT DETECTED NOT DETECTED Final   Rotavirus A NOT DETECTED NOT DETECTED Final   Sapovirus (I, II, IV, and V) NOT DETECTED NOT DETECTED Final    Comment: Performed at [facility], [address] [place], [place] [address]  SARS Coronavirus 2 by RT PCR (hospital order, performed in [facility] hospital lab) Nasopharyngeal Nasopharyngeal Swab     Status: None   Collection Time: 06/12/20  4:48 AM   Specimen: Nasopharyngeal Swab  Result Value Ref Range Status   SARS Coronavirus 2 NEGATIVE NEGATIVE Final    Comment: (NOTE) SARS-CoV-2 target nucleic acids are NOT DETECTED.  The SARS-CoV-2 RNA is generally detectable in upper and lower respiratory specimens during the acute phase of infection. The lowest concentration of SARS-CoV-2 viral copies this assay can detect is 250 copies / mL. A negative result does not preclude SARS-CoV-2 infection and should not be used as the sole basis for treatment or other patient management decisions.  A negative result may occur with improper specimen collection / handling, submission of specimen other than nasopharyngeal swab, presence of viral mutation(s) within the areas targeted by this assay, and inadequate number of viral copies (<250 copies / mL). A negative result must be combined with clinical observations, patient history, and epidemiological information.  Fact Sheet for Patients:   StrictlyIdeas.no  Fact Sheet for Healthcare Providers: BankingDealers.co.za  This test is not yet approved or   cleared by the [place] FDA and has been authorized for detection and/or  diagnosis of SARS-CoV-2 by FDA under an Emergency Use Authorization (EUA).  This EUA will remain in effect (meaning this test can be used) for the duration of the COVID-19 declaration under Section 564(b)(1) of the Act, 21 U.S.C. section 360bbb-3(b)(1), unless the authorization is terminated or revoked sooner.  Performed at [facility], [address] [place],  [address]     Radiology Reports CT ABDOMEN PELVIS WO CONTRAST  Result Date: 06/12/2020 CLINICAL DATA:  Left lower quadrant abdominal pain. EXAM: CT ABDOMEN AND PELVIS WITHOUT CONTRAST TECHNIQUE: Multidetector CT imaging of the abdomen and pelvis was performed following the standard protocol without IV contrast. COMPARISON:  None. FINDINGS: Lower chest: The lung bases are clear. The heart size is normal. Hepatobiliary: The liver is normal. Status post cholecystectomy.There is no biliary ductal dilation. Pancreas: Normal contours without ductal dilatation. No peripancreatic fluid collection. Spleen: Unremarkable. Adrenals/Urinary Tract: --Adrenal glands: Unremarkable. --Right kidney/ureter: There is a nonobstructing stone in the upper pole the right kidney. --Left kidney/ureter: No hydronephrosis or radiopaque kidney stones. --Urinary bladder: Unremarkable. Stomach/Bowel: --Stomach/Duodenum: There is a large hiatal hernia. --Small bowel: Unremarkable. --Colon: There is diffuse circumferential wall thickening of much of the descending colon and sigmoid colon. 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 There are enlarged adjacent regional lymph nodes. There is a moderate amount of stool in the remaining portions of the colon. There may be a single inflamed diverticulum in the ascending colon (axial series 2, image 37). --Appendix: Not visualized. No right lower quadrant inflammation or free fluid. Vascular/Lymphatic: Atherosclerotic calcification is present within  the non-aneurysmal abdominal aorta, without hemodynamically significant stenosis. --No retroperitoneal lymphadenopathy. --there are mildly enlarged mesenteric lymph nodes. There are multiple enlarged regional lymph nodes along the course of the descending colon. --No pelvic or inguinal lymphadenopathy. Reproductive: There is a complex cystic mass in the right hemipelvis likely associated with the right ovary measuring 3.9 x 2.6 cm (axial series 2, image 69). There is a small cystic structure involving the left ovary measuring approximately 1.8 cm. The patient is status post prior hysterectomy. Other: No ascites or free air. The abdominal wall is normal. Musculoskeletal. No acute displaced fractures. IMPRESSION: 1. Diffuse circumferential wall thickening of much of the descending colon and sigmoid colon with multiple enlarged adjacent regional lymph nodes. Findings are concerning for an infectious or inflammatory colitis. Correlation with patient's symptoms and laboratory studies is recommended. As an underlying mass cannot be excluded, a follow-up colonoscopy is recommended as an outpatient. 2. Complex cystic mass in the right hemipelvis likely associated with the right ovary measuring up to 3.9 cm. Follow-up with a nonemergent outpatient pelvic ultrasound is recommended. 3. Large hiatal hernia. 4. Nonobstructive right nephrolithiasis. Aortic Atherosclerosis (ICD10-I70.0). Electronically Signed   By: Constance Holster M.D.   On: 06/12/2020 04:00     CBC Recent Labs  Lab 06/12/20 0020 06/12/20 0915 06/13/20 0537 06/14/20 0636 06/15/20 0801  WBC 17.7* 18.0* 15.3* 13.9* 15.9*  HGB 9.1* 9.2* 7.9* 8.0* 8.0*  HCT 29.0* 29.9* 24.2* 25.1* 24.8*  PLT 560* 576* 453* 415* 409*  MCV 84.8 89.0 82.6 84.2 82.4  MCH 26.6 27.4 27.0 26.8 26.6  MCHC 31.4 30.8 32.6 31.9 32.3  RDW 17.6* 18.3* 17.0* 17.3* 17.4*  LYMPHSABS 0.9 0.8 1.5 1.4 1.4  MONOABS 1.4* 1.5* 1.6* 1.6* 1.7*  EOSABS 0.2 0.2 0.5 0.4 0.4  BASOSABS  0.1 0.1 0.0 0.1 0.1    Chemistries  Recent Labs  Lab 06/12/20 0020 06/12/20 0900 06/13/20 0537 06/14/20 0636 06/15/20 0801  NA 135 132* 134* 135 136  K 3.2* 3.7 3.3* 3.2* 4.6  CL 98 104 101 98 99  CO2 24 16* 25 29 29   GLUCOSE 250* 114* 101* 99 80  BUN 35* 32* 24* 22 20  CREATININE 1.68* 1.41* 1.18* 1.21* 1.12*  CALCIUM 8.2* 7.8* 7.4* 7.3* 7.6*  MG 1.5*  --   --   --   --   AST 18  --  16  --   --   ALT 15  --  12  --   --   ALKPHOS 129*  --  107  --   --   BILITOT 0.8  --  0.6  --   --    ------------------------------------------------------------------------------------------------------------------ No results for input(s): CHOL, HDL, LDLCALC, TRIG, CHOLHDL, LDLDIRECT in the last 72 hours.  Lab Results  Component Value Date   HGBA1C 6.9 (H) 06/12/2020   ------------------------------------------------------------------------------------------------------------------ No results for input(s): TSH, T4TOTAL, T3FREE, THYROIDAB in the last 72 hours.  Invalid input(s): FREET3 ------------------------------------------------------------------------------------------------------------------ No results for input(s): VITAMINB12, FOLATE, FERRITIN, TIBC, IRON, RETICCTPCT in the last 72 hours.  Coagulation profile No results for input(s): INR, PROTIME in the last 168 hours.  No results for input(s): DDIMER in the last 72 hours.  Cardiac Enzymes No results for input(s): CKMB, TROPONINI, MYOGLOBIN in the last 168 hours.  Invalid input(s): CK ------------------------------------------------------------------------------------------------------------------    Component Value Date/Time   BNP 177.8 (H) 11/29/2016 1657    Roxan Hockey M.D on 06/15/2020 at 5:18 PM  Go to [email or web address] - for contact info  [facility] - Office  [phone]

## 2020-06-16 LAB — BASIC METABOLIC PANEL
Anion gap: 8 (ref 5–15)
BUN: 18 mg/dL (ref 8–23)
CO2: 28 mmol/L (ref 22–32)
Calcium: 7.8 mg/dL — ABNORMAL LOW (ref 8.9–10.3)
Chloride: 100 mmol/L (ref 98–111)
Creatinine, Ser: 1.22 mg/dL — ABNORMAL HIGH (ref 0.44–1.00)
GFR calc Af Amer: 49 mL/min — ABNORMAL LOW (ref >60)
GFR calc non Af Amer: 43 mL/min — ABNORMAL LOW (ref >60)
Glucose, Bld: 69 mg/dL — ABNORMAL LOW (ref 70–99)
Potassium: 4.7 mmol/L (ref 3.5–5.1)
Sodium: 136 mmol/L (ref 135–145)

## 2020-06-16 LAB — GLUCOSE, CAPILLARY
Glucose-Capillary: 74 mg/dL (ref 70–99)
Glucose-Capillary: 119 mg/dL — ABNORMAL HIGH (ref 70–99)
Glucose-Capillary: 122 mg/dL — ABNORMAL HIGH (ref 70–99)
Glucose-Capillary: 125 mg/dL — ABNORMAL HIGH (ref 70–99)

## 2020-06-16 LAB — CLOSTRIDIUM DIFFICILE BY PCR, REFLEXED: Toxigenic C. Difficile by PCR: POSITIVE — AB

## 2020-06-16 MED ORDER — VANCOMYCIN 50 MG/ML ORAL SOLUTION
125.0000 mg | Freq: Four times a day (QID) | ORAL | Status: DC
  Administered 2020-06-16 – 2020-06-20 (×19): 125 mg via ORAL
  Filled 2020-06-16 (×26): qty 2.5

## 2020-06-16 NOTE — TOC Progression Note (Signed)
Transition of Care [facility]) - Progression Note   Patient Details  Name: [name] MRN: [id number] Date of Birth: 02/20/1941  Transition of Care [facility]) CM/SW [place], LCSW Phone Number: 06/16/2020, 6:03 PM  Clinical Narrative: FL2 completed and faxed out. Requested PASRR. PASRR is pending. Requested documents uploaded.  Expected Discharge Plan: Home/Self Care Barriers to Discharge: Continued Medical Work up  Expected Discharge Plan and Services Expected Discharge Plan: Home/Self Care Living arrangements for the past 2 months: Single Family Home  Readmission Risk Interventions No flowsheet data found.

## 2020-06-16 NOTE — Plan of Care (Signed)
  Problem: Acute Rehab PT Goals(only PT should resolve) Goal: Pt Will Go Supine/Side To Sit Outcome: Progressing Flowsheets (Taken 06/16/2020 1237) Pt will go Supine/Side to Sit: with minimal assist Goal: Patient Will Transfer Sit To/From Stand Outcome: Progressing Flowsheets (Taken 06/16/2020 1237) Patient will transfer sit to/from stand:  with minimal assist  with moderate assist Goal: Pt Will Transfer Bed To Chair/Chair To Bed Outcome: Progressing Flowsheets (Taken 06/16/2020 1237) Pt will Transfer Bed to Chair/Chair to Bed: with min assist Goal: Pt Will Ambulate Outcome: Progressing Flowsheets (Taken 06/16/2020 1237) Pt will Ambulate:  15 feet  with moderate assist  with rolling walker   12:37 PM, 06/16/20 Lonell Grandchild, MPT Physical Therapist with [facility] [phone] office [phone] mobile phone

## 2020-06-16 NOTE — Progress Notes (Signed)
VAST team called to start new peripheral IV. Assessed patient with [place]. Patient noted to have poor vasculature bilateral arms, most veins non-pliable in forearms. Patient with moderate swelling to right arm. 1 unsuccessful IV attempt with [place] to left forearm on the one pliable vein found. Able to place IV on second attempt with [place] in right forearm. Would recommend midline or PICC for further access should new peripheral IV fail and patient require continued IV therapy. Dr Denton Brick notified.

## 2020-06-16 NOTE — Evaluation (Signed)
Physical Therapy Evaluation Patient Details Name: [name] MRN: [id number] DOB: [birth year] Today's Date: 06/16/2020   History of Present Illness  [name] is a 79 y.o. female with medical history significant of normocytic anemia, anxiety, depression, osteoarthritis of the lumbar spine, carpal tunnel syndrome, chronic bronchitis, stage III CKD, COPD, diverticulosis, erosive esophagitis, GERD, unspecified headaches, hyperlipidemia, hypertension, mitral regurgitation, myasthenia gravis in remission, obesity OSA on CPAP, history of pneumonia, pulmonary hypertension, Schatzki's ring, seasonal allergies, shoulder pain, thyroid disease, tobacco abuse, type 2 diabetes mellitus who is coming to the emergency department with complaints of diarrhea for the past 4 weeks associated with weakness and 3 or 4 episodes of emesis since her symptoms begun.  She threw up once last night.  She has been having hematochezia on locations.  She has frequent constipation.  She denies dysuria, frequency or hematuria.  She denies fever, but complains of chills and fatigue.  No rhinorrhea, sore throat, dyspnea, wheezing or hemoptysis.  Denies chest pain, palpitations, diaphoresis, PND, orthopnea or recent pitting edema of the lower extremities.  She denies polyuria, polydipsia, polyphagia or blurred vision.  She denies travel history or sick contacts.    Clinical Impression  Patient demonstrates slow labored movement for sitting up at bedside requiring frequent rest breaks and Mod assistance to move legs, had difficulty completing sit to stands due to BLE weakness and limited to a few steps at bedside due to severe fall risk.  Patient tolerated sitting up in chair after therapy - NT notified.  Patient will benefit from continued physical therapy in hospital and recommended venue below to increase strength, balance, endurance for safe ADLs and gait.      Follow Up Recommendations SNF    Equipment Recommendations   None recommended by PT    Recommendations for Other Services       Precautions / Restrictions Precautions Precautions: Fall Restrictions Weight Bearing Restrictions: No      Mobility  Bed Mobility Overal bed mobility: Needs Assistance Bed Mobility: Supine to Sit     Supine to sit: Mod assist     General bed mobility comments: slow labored movement  Transfers Overall transfer level: Needs assistance   Transfers: Sit to/from Stand;Stand Pivot Transfers Sit to Stand: Mod assist Stand pivot transfers: Mod assist       General transfer comment: increased time, labored movement, required multiple attempts to complete sit to stand due to BLE weakness  Ambulation/Gait Ambulation/Gait assistance: Mod assist;Max assist Gait Distance (Feet): 4 Feet Assistive device: Rolling walker (2 wheeled) Gait Pattern/deviations: Decreased step length - right;Decreased step length - left;Decreased stride length Gait velocity: decreased   General Gait Details: limited to 4-5 slow unsteady labored steps at bedside due to poor balance, generalized weakness  Stairs            Wheelchair Mobility    Modified Rankin (Stroke Patients Only)       Balance Overall balance assessment: Needs assistance Sitting-balance support: Feet supported;No upper extremity supported Sitting balance-Leahy Scale: Fair Sitting balance - Comments: fair/good seated at bedside   Standing balance support: During functional activity;Bilateral upper extremity supported Standing balance-Leahy Scale: Poor Standing balance comment: fair/poor using RW                             Pertinent Vitals/Pain Pain Assessment: 0-10 Pain Score: 8  Pain Location: bilateral hips/legs Pain Descriptors / Indicators: Sore;Dull;Grimacing Pain Intervention(s): Limited activity within patient's tolerance;Monitored  during session;Patient requesting pain meds-RN notified    Home Living Family/patient expects to  be discharged to:: Private residence Living Arrangements: Alone Available Help at Discharge: Family;Available PRN/intermittently Type of Home: Other(Comment) (Duplex) Home Access: Stairs to enter Entrance Stairs-Rails: Left Entrance Stairs-Number of Steps: 4 Home Layout: One level Home Equipment: Shower seat;Cane - single point      Prior Function Level of Independence: Independent with assistive device(s)         Comments: household ambulator with SPC PRN     Hand Dominance        Extremity/Trunk Assessment   Upper Extremity Assessment Upper Extremity Assessment: Generalized weakness    Lower Extremity Assessment Lower Extremity Assessment: Generalized weakness    Cervical / Trunk Assessment Cervical / Trunk Assessment: Normal  Communication   Communication: No difficulties  Cognition Arousal/Alertness: Awake/alert Behavior During Therapy: WFL for tasks assessed/performed Overall Cognitive Status: Within Functional Limits for tasks assessed                                        General Comments      Exercises     Assessment/Plan    PT Assessment Patient needs continued PT services  PT Problem List Decreased strength;Decreased activity tolerance;Decreased balance;Decreased mobility       PT Treatment Interventions Balance training;Gait training;Stair training;Functional mobility training;Therapeutic activities;Therapeutic exercise;Patient/family education    PT Goals (Current goals can be found in the Care Plan section)  Acute Rehab PT Goals Patient Stated Goal: return home after rehab PT Goal Formulation: With patient Time For Goal Achievement: 06/30/20 Potential to Achieve Goals: Good    Frequency Min 3X/week   Barriers to discharge        Co-evaluation               AM-PAC PT "6 Clicks" Mobility  Outcome Measure Help needed turning from your back to your side while in a flat bed without using bedrails?: A Lot Help  needed moving from lying on your back to sitting on the side of a flat bed without using bedrails?: A Lot Help needed moving to and from a bed to a chair (including a wheelchair)?: A Lot Help needed standing up from a chair using your arms (e.g., wheelchair or bedside chair)?: A Lot Help needed to walk in hospital room?: A Lot Help needed climbing 3-5 steps with a railing? : Total 6 Click Score: 11    End of Session   Activity Tolerance: Patient tolerated treatment well;Patient limited by fatigue Patient left: in chair;with call bell/phone within reach;with chair alarm set Nurse Communication: Mobility status PT Visit Diagnosis: Unsteadiness on feet (R26.81);Other abnormalities of gait and mobility (R26.89);Muscle weakness (generalized) (M62.81)    Time: 9323-5573 PT Time Calculation (min) (ACUTE ONLY): 34 min   Charges:   PT Evaluation $PT Eval Moderate Complexity: 1 Mod PT Treatments $Therapeutic Activity: 23-37 mins        12:36 PM, 06/16/20 Lonell Grandchild, MPT Physical Therapist with [facility] [phone] office [phone] mobile phone

## 2020-06-16 NOTE — NC FL2 (Signed)
[place] LEVEL OF CARE SCREENING TOOL     IDENTIFICATION  Patient Name: [name] Birthdate: 11-04-1941 Sex: female Admission Date (Current Location): 06/11/2020  [facility] and [place] Number:  [employer] and Address:  [place] [address], [place]      Provider Number: [id number]  Attending Physician Name and Address:  Roxan Hockey, MD  Relative Name and Phone Number:       Current Level of Care: Hospital Recommended Level of Care: [place] Prior Approval Number:    Date Approved/Denied:   PASRR Number:    Discharge Plan: SNF    Current Diagnoses: Patient Active Problem List   Diagnosis Date Noted  . Acute colitis 06/12/2020  . Hypokalemia 06/12/2020  . AKI (acute kidney injury) ([place]) 06/12/2020  . Hypomagnesemia 06/12/2020  . Colitis 06/12/2020  . Osteoarthritis of both knees 05/24/2020  . Elevated alkaline phosphatase measurement 10/21/2019  . Back pain 07/05/2019  . Rectal bleeding 01/08/2019  . Constipation 01/08/2019  . Nausea and vomiting 11/03/2018  . Trigger finger, right index finger   . Monoclonal gammopathy of unknown significance (MGUS) 11/26/2017  . Memory loss of unknown cause 11/10/2017  . Chronic left shoulder pain 09/14/2017  . Encounter for pain management 02/09/2017  . Acute maxillary sinusitis 04/11/2016  . Anemia in chronic renal disease 12/30/2015  . Morbid obesity ([place]) 04/18/2015  . Myasthenia gravis in remission ([place]) 11/24/2014  . Myasthenia gravis ([place]) 11/16/2014  . Vitamin D deficiency 05/24/2014  . Osteopenia 04/26/2014  . Macular hole of left eye 01/19/2014  . Generalized osteoarthritis 05/25/2013  . Esophageal dysphagia 04/03/2012  . Abnormal chest CT 03/02/2012  . Insomnia 01/02/2012  . Malignant hypertension 05/30/2011  . Schatzki's ring 05/07/2011  . INGROWN TOENAIL 01/10/2011  . Unspecified glaucoma 08/08/2010  . DYSCHROMIA,  UNSPECIFIED 08/08/2010  . CKD stage 3 due to type 2 diabetes mellitus ([place]) 09/14/2009  . HIP PAIN, LEFT 06/09/2009  . Urinary incontinence 06/09/2009  . IDA (iron deficiency anemia) 10/02/2008  . Hypothyroidism 09/27/2008  . Hyperlipemia 06/30/2008  . Depression with anxiety 06/30/2008  . GERD 06/30/2008  . Obstructive sleep apnea 06/30/2008  . CARPAL TUNNEL SYNDROME 05/19/2008  . SHOULDER PAIN 02/02/2008  . IMPINGEMENT SYNDROME 02/02/2008  . DEGENERATIVE JOINT DISEASE, KNEE 11/24/2007  . Type 2 diabetes mellitus ([place]) 09/09/2007    Orientation RESPIRATION BLADDER Height & Weight     Self, Time, Situation, Place  Normal Incontinent Weight: 229 lb 8 oz (104.1 kg) Height:  5\' 6"  (167.6 cm)  BEHAVIORAL SYMPTOMS/MOOD NEUROLOGICAL BOWEL NUTRITION STATUS      Incontinent Diet (Soft diet)  AMBULATORY STATUS COMMUNICATION OF NEEDS Skin   Extensive Assist Verbally Other (Comment) (Skin tear: bilateral buttocks)                       Personal Care Assistance Level of Assistance  Bathing, Feeding, Dressing Bathing Assistance: Limited assistance Feeding assistance: Independent Dressing Assistance: Limited assistance     Functional Limitations Info  Sight, Hearing, Speech Sight Info: Impaired (Glaucoma) Hearing Info: Adequate Speech Info: Adequate    SPECIAL CARE FACTORS FREQUENCY  PT (By licensed PT)     PT Frequency: Min 3x's/week              Contractures Contractures Info: Not present    Additional Factors Info  Allergies, Code Status, Psychotropic Code Status Info: Full Allergies Info: NKA Psychotropic Info: Celexa (citalopram); Neurontin (  gabapentin); Effexor (venlafaxine)         Current Medications (06/16/2020):  This is the current hospital active medication list Current Facility-Administered Medications  Medication Dose Route Frequency Provider Last Rate Last Admin  . acetaminophen (TYLENOL) tablet 650 mg  650 mg Oral Q6H PRN Reubin Milan, MD    650 mg at 06/14/20 2154   Or  . acetaminophen (TYLENOL) suppository 650 mg  650 mg Rectal Q6H PRN Reubin Milan, MD      . bismuth subsalicylate (PEPTO BISMOL) 262 MG/15ML suspension 30 mL  30 mL Oral Q4H PRN Reubin Milan, MD      . brimonidine [facility]) 0.2 % ophthalmic solution 1 drop  1 drop Both Eyes Q12H Kathie Dike, MD   1 drop at 06/16/20 1122   And  . timolol (TIMOPTIC) 0.5 % ophthalmic solution 1 drop  1 drop Both Eyes Q12H Kathie Dike, MD   1 drop at 06/16/20 1122  . citalopram (CELEXA) tablet 10 mg  10 mg Oral Daily Kathie Dike, MD   10 mg at 06/16/20 0757  . cycloSPORINE (RESTASIS) 0.05 % ophthalmic emulsion 1 drop  1 drop Both Eyes BID Kathie Dike, MD   1 drop at 06/16/20 0759  . darifenacin (ENABLEX) 24 hr tablet 15 mg  15 mg Oral Daily Kathie Dike, MD   15 mg at 06/16/20 0756  . dextrose 5 % 1,000 mL with potassium chloride 20 mEq, sodium bicarbonate 150 mEq infusion   Intravenous Continuous [place] Brick, Courage, MD 40 mL/hr at 06/15/20 1742 Rate Change at 06/15/20 1742  . ezetimibe (ZETIA) tablet 10 mg  10 mg Oral Daily Kathie Dike, MD   10 mg at 06/16/20 0758  . gabapentin (NEURONTIN) capsule 300 mg  300 mg Oral BID Kathie Dike, MD   300 mg at 06/16/20 0758  . HYDROcodone-acetaminophen (NORCO/VICODIN) 5-325 MG per tablet 1 tablet  1 tablet Oral Q6H PRN Kathie Dike, MD   1 tablet at 06/16/20 1119  . insulin aspart (novoLOG) injection 0-5 Units  0-5 Units Subcutaneous QHS Emokpae, Courage, MD      . insulin aspart (novoLOG) injection 0-6 Units  0-6 Units Subcutaneous TID WC Emokpae, Courage, MD      . multivitamin with minerals tablet 1 tablet  1 tablet Oral Daily Roxan Hockey, MD   1 tablet at 06/16/20 0757  . nystatin (MYCOSTATIN/NYSTOP) topical powder   Topical TID Reubin Milan, MD   Given at 06/16/20 0800  . pantoprazole (PROTONIX) EC tablet 40 mg  40 mg Oral Daily Kathie Dike, MD   40 mg at 06/16/20 0759  . predniSONE  (DELTASONE) tablet 5 mg  5 mg Oral Daily Kathie Dike, MD   5 mg at 06/16/20 0759  . prochlorperazine (COMPAZINE) injection 5 mg  5 mg Intravenous Q4H PRN Reubin Milan, MD   5 mg at 06/12/20 2120  . pyridostigmine (MESTINON) tablet 60 mg  60 mg Oral TID Kathie Dike, MD   60 mg at 06/16/20 1458  . rosuvastatin (CRESTOR) tablet 40 mg  40 mg Oral q1800 Kathie Dike, MD   40 mg at 06/15/20 1745  . vancomycin (VANCOCIN) 50 mg/mL oral solution 125 mg  125 mg Oral QID Roxan Hockey, MD   125 mg at 06/16/20 1457  . venlafaxine XR (EFFEXOR-XR) 24 hr capsule 150 mg  150 mg Oral Daily Kathie Dike, MD   150 mg at 06/16/20 0756  . white petrolatum (VASELINE) gel 1 application  1 application  Topical PRN Reubin Milan, MD         Discharge Medications: Please see discharge summary for a list of discharge medications.  Relevant Imaging Results:  Relevant Lab Results:   Additional Information SSN#: [id number]  Sherie Don, LCSW

## 2020-06-16 NOTE — Progress Notes (Signed)
Patient Demographics:    [name], is a 79 y.o. female, DOB - 20-Jul-1941, JJO:[id number]  Admit date - 06/11/2020   Admitting Physician Kathie Dike, MD  Outpatient Primary MD for the patient is Fayrene Helper, MD  LOS - 4  Chief Complaint  Patient presents with   Diarrhea        Subjective:    [name] today has no fevers,  No chest pain,   -large volume diarrhea persist, no emesis   Assessment  & Plan :    Principal Problem:   Acute colitis Active Problems:   Hypothyroidism   Type 2 diabetes mellitus ([place])   Hyperlipemia   IDA (iron deficiency anemia)   Depression with anxiety   Unspecified glaucoma   GERD   CKD stage 3 due to type 2 diabetes mellitus ([place])   Obstructive sleep apnea   Myasthenia gravis in remission ([place])   Hypokalemia   AKI (acute kidney injury) ([place])   Hypomagnesemia   Colitis   1)Acute C. Difficile Colitis--- POA-- abdominal discomfort nausea and diarrhea persist, -Large-volume watery diarrhea persist -CT abdomen and pelvis on admission with significant colitis -Initially treated with IV Cipro and Flagyl since 06/12/2020, stopped on 06/16/2020 when C. difficile testing came back positive --Patient had significant watery large-volume diarrhea on admission, CT abdomen pelvis was consistent with colitis--- there was high clinical index of suspicion for C. difficile colitis on admission so an order for C. difficile testing and GI pathogen was placed on admission - -However stool sample sent for GI pathogen but somehow the stool sample was never sent to the lab when not tested for C. difficile colitis even though CD4 was ordered on admission --Given persistent diarrhea and symptoms consistent with suspected C. difficile colitis on 06/16/2020 with the approval of ID physician Dr. Johnnye Sima if C. difficile panel was obtained which came back consistent with  suspected C. difficile colitis -P.o. vancomycin initiated on 06/16/2020 based on these results, Cipro and Flagyl discontinued on 06/16/2020 -In summary patient's C. difficile colitis was present on admission however testing for C. difficile colitis was delayed as narrated above  -Patient will need outpatient GI follow-up for colonoscopy once acute issues have resolved. -Advance diet to soft -GI pathogen from 06/12/20 is  negative -C diff was NOT tested- Discussed with ID physician  Dr. Lorriane Shire to get approval for C. difficile testing for this particular patient given persistent diarrhea, persistent leukocytosis -C. difficile test was ordered initially on admission but sample was never collected -WBC 15.9 down from 18.0 on 06/12/20  2)ABLA---??? is down to 8.0 from 11.2 query acute blood loss versus hemodilution -Outpatient colonoscopy planned  3)Acute kidney injury superimposed on CKD stage IIIa--- Likely secondary to volume depletion from GI losses.    Creatinine is down to 1.1 from 1.68 after hydration chronic kidney disease stage III.     4)Myasthenia gravis---  Continue on pyridostigmine 3 times daily.  She is also on chronic prednisone  5)Diabetes mellitus type 2--  P.o. intake has been poor and she has had episodes of hypoglycemia.  Hold oral agents and basal insulin onto oral intake is more reliable ---Continue on sliding scale insulin.  6)Hyperlipidemia.  Continue Crestor and Zetia  7)Hypothyroidism.  Continue on  Synthroid  8)Non-anion gap metabolic acidosis.  Likely related to GI losses.  Resolved with bicarbonate infusion.  9)Right pelvic cystic mass, likely ovarian.  Incidental finding on CT.  Will need outpatient ovarian ultrasound once acute issues have resolved.---Complex cystic mass in the right hemipelvis likely associated with the right ovary measuring up to 3.9 cm. Follow-up with a nonemergent outpatient pelvic ultrasound is recommended. --- Needs Pelvic  ultrasound and colonoscopy  10)Findings are concerning for an infectious or inflammatory colitis.. As an underlying mass cannot be excluded, a follow-up colonoscopy is recommended as an outpatient  11)Ulcer in Perineum--Lt > Rt--- looks like pressure ulcers from thighs rubbing together--- --please see photos in epic, wound care consult requested  Generalized weakness and deconditioning--- PT eval appreciated recommends SNF rehab  Status is: Inpatient  Remains inpatient appropriate because:-frequent large-volume watery stools with high risk for dehydration and electrolyte abnormalities if IV fluids are discontinued   Disposition: The patient is from: Home              Anticipated d/c is to: SNF              Anticipated d/c date is: 2 days              Patient currently is not medically stable to d/c. Barriers: Not Clinically Stable- -frequent large-volume watery stools with high risk for dehydration and electrolyte abnormalities if IV fluids are discontinued  Code Status : full  Family Communication:  (patient is alert, awake and coherent)   Consults  :  na  DVT Prophylaxis  :  - SCDs (hgb dropped)  Lab Results  Component Value Date   PLT 409 (H) 06/15/2020    Inpatient Medications  Scheduled Meds:  brimonidine  1 drop Both Eyes Q12H   And   timolol  1 drop Both Eyes Q12H   citalopram  10 mg Oral Daily   cycloSPORINE  1 drop Both Eyes BID   darifenacin  15 mg Oral Daily   ezetimibe  10 mg Oral Daily   gabapentin  300 mg Oral BID   insulin aspart  0-5 Units Subcutaneous QHS   insulin aspart  0-6 Units Subcutaneous TID WC   multivitamin with minerals  1 tablet Oral Daily   nystatin   Topical TID   pantoprazole  40 mg Oral Daily   predniSONE  5 mg Oral Daily   pyridostigmine  60 mg Oral TID   rosuvastatin  40 mg Oral q1800   vancomycin  125 mg Oral QID   venlafaxine XR  150 mg Oral Daily   Continuous Infusions:  dextrose 5 % 1,000 mL with  potassium chloride 20 mEq, sodium bicarbonate 150 mEq infusion 40 mL/hr at 06/15/20 1742   PRN Meds:.acetaminophen **OR** acetaminophen, bismuth subsalicylate, HYDROcodone-acetaminophen, prochlorperazine, white petrolatum    Anti-infectives (From admission, onward)   Start     Dose/Rate Route Frequency Ordered Stop   06/16/20 1000  vancomycin (VANCOCIN) 50 mg/mL oral solution 125 mg     Discontinue     125 mg Oral 4 times daily 06/16/20 0947 06/26/20 0959   06/12/20 1600  ciprofloxacin (CIPRO) IVPB 400 mg  Status:  Discontinued        400 mg 200 mL/hr over 60 Minutes Intravenous Every 12 hours 06/12/20 0515 06/16/20 0947   06/12/20 1400  metroNIDAZOLE (FLAGYL) IVPB 500 mg  Status:  Discontinued        500 mg 100 mL/hr over 60 Minutes Intravenous Every 8 hours  06/12/20 0535 06/16/20 0947   06/12/20 0530  metroNIDAZOLE (FLAGYL) IVPB 500 mg  Status:  Discontinued        500 mg 100 mL/hr over 60 Minutes Intravenous Every 8 hours 06/12/20 0515 06/12/20 0535   06/12/20 0500  ciprofloxacin (CIPRO) IVPB 400 mg        400 mg 200 mL/hr over 60 Minutes Intravenous  Once 06/12/20 0447 06/12/20 0639   06/12/20 0500  metroNIDAZOLE (FLAGYL) IVPB 500 mg        500 mg 100 mL/hr over 60 Minutes Intravenous  Once 06/12/20 0447 06/12/20 0639        Objective:   Vitals:   06/15/20 1951 06/15/20 2155 06/16/20 0551 06/16/20 1442  BP:  (!) 123/56 (!) 113/49 (!) 121/51  Pulse:  72 70 61  Resp:  20 18 16   Temp:  98.9 F (37.2 C) 98 F (36.7 C) 98.4 F (36.9 C)  TempSrc:  Oral Axillary Oral  SpO2: 93% 100% 99% 94%  Weight:      Height:        Wt Readings from Last 3 Encounters:  06/12/20 104.1 kg  05/20/20 106.6 kg  04/29/20 106.7 kg     Intake/Output Summary (Last 24 hours) at 06/16/2020 1730 Last data filed at 06/16/2020 0500 Gross per 24 hour  Intake 240 ml  Output 600 ml  Net -360 ml     Physical Exam  Gen:- Awake Alert, in no acute distress HEENT:- [place].AT, No sclera  icterus Neck-Supple Neck,No JVD,.  Lungs-  CTAB , fair symmetrical air movement CV- S1, S2 normal, regular  Abd-  +ve B.Sounds, Abd Soft, abdominal discomfort on palpation without significant tenderness per se    Extremity/Skin:- No  edema, pedal pulses present  Psych-affect is appropriate, oriented x3 Neuro-generalized weakness, no new focal deficits, no tremors GU- Ulcer in Perineum--Lt > Rt--- looks like pressure ulcers from thighs rubbing together--- please see photos in epic   Data Review:   Micro Results Recent Results (from the past 240 hour(s))  Gastrointestinal Panel by PCR , Stool     Status: None   Collection Time: 06/12/20 12:50 AM   Specimen: Rectum; Stool  Result Value Ref Range Status   Campylobacter species NOT DETECTED NOT DETECTED Final   Plesimonas shigelloides NOT DETECTED NOT DETECTED Final   Salmonella species NOT DETECTED NOT DETECTED Final   Yersinia enterocolitica NOT DETECTED NOT DETECTED Final   Vibrio species NOT DETECTED NOT DETECTED Final   Vibrio cholerae NOT DETECTED NOT DETECTED Final   Enteroaggregative E coli (EAEC) NOT DETECTED NOT DETECTED Final   Enteropathogenic E coli (EPEC) NOT DETECTED NOT DETECTED Final   Enterotoxigenic E coli (ETEC) NOT DETECTED NOT DETECTED Final   Shiga like toxin producing E coli (STEC) NOT DETECTED NOT DETECTED Final   Shigella/Enteroinvasive E coli (EIEC) NOT DETECTED NOT DETECTED Final   Cryptosporidium NOT DETECTED NOT DETECTED Final   Cyclospora cayetanensis NOT DETECTED NOT DETECTED Final   Entamoeba histolytica NOT DETECTED NOT DETECTED Final   Giardia lamblia NOT DETECTED NOT DETECTED Final   Adenovirus F40/41 NOT DETECTED NOT DETECTED Final   Astrovirus NOT DETECTED NOT DETECTED Final   Norovirus GI/GII NOT DETECTED NOT DETECTED Final   Rotavirus A NOT DETECTED NOT DETECTED Final   Sapovirus (I, II, IV, and V) NOT DETECTED NOT DETECTED Final    Comment: Performed at [name], [address], [place], [place] [address]  SARS Coronavirus 2 by RT PCR (hospital  order, performed in [facility] hospital lab) Nasopharyngeal Nasopharyngeal Swab     Status: None   Collection Time: 06/12/20  4:48 AM   Specimen: Nasopharyngeal Swab  Result Value Ref Range Status   SARS Coronavirus 2 NEGATIVE NEGATIVE Final    Comment: (NOTE) SARS-CoV-2 target nucleic acids are NOT DETECTED.  The SARS-CoV-2 RNA is generally detectable in upper and lower respiratory specimens during the acute phase of infection. The lowest concentration of SARS-CoV-2 viral copies this assay can detect is 250 copies / mL. A negative result does not preclude SARS-CoV-2 infection and should not be used as the sole basis for treatment or other patient management decisions.  A negative result may occur with improper specimen collection / handling, submission of specimen other than nasopharyngeal swab, presence of viral mutation(s) within the areas targeted by this assay, and inadequate number of viral copies (<250 copies / mL). A negative result must be combined with clinical observations, patient history, and epidemiological information.  Fact Sheet for Patients:   StrictlyIdeas.no  Fact Sheet for Healthcare Providers: BankingDealers.co.za  This test is not yet approved or  cleared by the [place] FDA and has been authorized for detection and/or diagnosis of SARS-CoV-2 by FDA under an Emergency Use Authorization (EUA).  This EUA will remain in effect (meaning this test can be used) for the duration of the COVID-19 declaration under Section 564(b)(1) of the Act, 21 U.S.C. section 360bbb-3(b)(1), unless the authorization is terminated or revoked sooner.  Performed at [facility], [address], [place], [place] [address]   C Difficile Quick Screen w PCR reflex     Status: Abnormal   Collection Time: 06/15/20  6:08 PM   Specimen: STOOL  Result Value Ref Range Status    C Diff antigen POSITIVE (A) NEGATIVE Final   C Diff toxin NEGATIVE NEGATIVE Final   C Diff interpretation Results are indeterminate. See PCR results.  Final    Comment: Performed at [facility], [address] [place], [place] [address]  C. Diff by PCR, Reflexed     Status: Abnormal   Collection Time: 06/15/20  6:08 PM  Result Value Ref Range Status   Toxigenic C. Difficile by PCR POSITIVE (A) NEGATIVE Final    Comment: Positive for toxigenic C. difficile with little to no toxin production. Only treat if clinical presentation suggests symptomatic illness. Performed at [facility], [place] [address] [place],  [address]     Radiology Reports CT ABDOMEN PELVIS WO CONTRAST  Result Date: 06/12/2020 CLINICAL DATA:  Left lower quadrant abdominal pain. EXAM: CT ABDOMEN AND PELVIS WITHOUT CONTRAST TECHNIQUE: Multidetector CT imaging of the abdomen and pelvis was performed following the standard protocol without IV contrast. COMPARISON:  None. FINDINGS: Lower chest: The lung bases are clear. The heart size is normal. Hepatobiliary: The liver is normal. Status post cholecystectomy.There is no biliary ductal dilation. Pancreas: Normal contours without ductal dilatation. No peripancreatic fluid collection. Spleen: Unremarkable. Adrenals/Urinary Tract: --Adrenal glands: Unremarkable. --Right kidney/ureter: There is a nonobstructing stone in the upper pole the right kidney. --Left kidney/ureter: No hydronephrosis or radiopaque kidney stones. --Urinary bladder: Unremarkable. Stomach/Bowel: --Stomach/Duodenum: There is a large hiatal hernia. --Small bowel: Unremarkable. --Colon: There is diffuse circumferential wall thickening of much of the descending colon and sigmoid colon. 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 There are enlarged adjacent regional lymph nodes. There is a moderate amount of stool in the remaining portions of the colon. There may be a single inflamed diverticulum in the ascending  colon (axial series 2, image 37). --Appendix: Not visualized. No right lower quadrant inflammation or free fluid. Vascular/Lymphatic: Atherosclerotic calcification is present within the non-aneurysmal abdominal aorta, without hemodynamically significant stenosis. --No retroperitoneal lymphadenopathy. --there are mildly enlarged mesenteric lymph nodes. There are multiple enlarged regional lymph nodes along the course of the descending colon. --No pelvic or inguinal lymphadenopathy. Reproductive: There is a complex cystic mass in the right hemipelvis likely associated with the right ovary measuring 3.9 x 2.6 cm (axial series 2, image 69). There is a small cystic structure involving the left ovary measuring approximately 1.8 cm. The patient is status post prior hysterectomy. Other: No ascites or free air. The abdominal wall is normal. Musculoskeletal. No acute displaced fractures. IMPRESSION: 1. Diffuse circumferential wall thickening of much of the descending colon and sigmoid colon with multiple enlarged adjacent regional lymph nodes. Findings are concerning for an infectious or inflammatory colitis. Correlation with patient's symptoms and laboratory studies is recommended. As an underlying mass cannot be excluded, a follow-up colonoscopy is recommended as an outpatient. 2. Complex cystic mass in the right hemipelvis likely associated with the right ovary measuring up to 3.9 cm. Follow-up with a nonemergent outpatient pelvic ultrasound is recommended. 3. Large hiatal hernia. 4. Nonobstructive right nephrolithiasis. Aortic Atherosclerosis (ICD10-I70.0). Electronically Signed   By: Constance Holster M.D.   On: 06/12/2020 04:00     CBC Recent Labs  Lab 06/12/20 0020 06/12/20 0915 06/13/20 0537 06/14/20 0636 06/15/20 0801  WBC 17.7* 18.0* 15.3* 13.9* 15.9*  HGB 9.1* 9.2* 7.9* 8.0* 8.0*  HCT 29.0* 29.9* 24.2* 25.1* 24.8*  PLT 560* 576* 453* 415* 409*  MCV 84.8 89.0 82.6 84.2 82.4  MCH 26.6 27.4 27.0 26.8  26.6  MCHC 31.4 30.8 32.6 31.9 32.3  RDW 17.6* 18.3* 17.0* 17.3* 17.4*  LYMPHSABS 0.9 0.8 1.5 1.4 1.4  MONOABS 1.4* 1.5* 1.6* 1.6* 1.7*  EOSABS 0.2 0.2 0.5 0.4 0.4  BASOSABS 0.1 0.1 0.0 0.1 0.1    Chemistries  Recent Labs  Lab 06/12/20 0020 06/12/20 0020 06/12/20 0900 06/13/20 0537 06/14/20 0636 06/15/20 0801 06/16/20 0515  NA 135   < > 132* 134* 135 136 136  K 3.2*   < > 3.7 3.3* 3.2* 4.6 4.7  CL 98   < > 104 101 98 99 100  CO2 24   < > 16* 25 29 29 28   GLUCOSE 250*   < > 114* 101* 99 80 69*  BUN 35*   < > 32* 24* 22 20 18   CREATININE 1.68*   < > 1.41* 1.18* 1.21* 1.12* 1.22*  CALCIUM 8.2*   < > 7.8* 7.4* 7.3* 7.6* 7.8*  MG 1.5*  --   --   --   --   --   --   AST 18  --   --  16  --   --   --   ALT 15  --   --  12  --   --   --   ALKPHOS 129*  --   --  107  --   --   --   BILITOT 0.8  --   --  0.6  --   --   --    < > = values in this interval not displayed.   ------------------------------------------------------------------------------------------------------------------ No results for input(s): CHOL, HDL, LDLCALC, TRIG, CHOLHDL, LDLDIRECT in the last 72 hours.  Lab Results  Component Value Date   HGBA1C 6.9 (H) 06/12/2020   ------------------------------------------------------------------------------------------------------------------ No results for  input(s): TSH, T4TOTAL, T3FREE, THYROIDAB in the last 72 hours.  Invalid input(s): FREET3 ------------------------------------------------------------------------------------------------------------------ No results for input(s): VITAMINB12, FOLATE, FERRITIN, TIBC, IRON, RETICCTPCT in the last 72 hours.  Coagulation profile No results for input(s): INR, PROTIME in the last 168 hours.  No results for input(s): DDIMER in the last 72 hours.  Cardiac Enzymes No results for input(s): CKMB, TROPONINI, MYOGLOBIN in the last 168 hours.  Invalid input(s):  CK ------------------------------------------------------------------------------------------------------------------    Component Value Date/Time   BNP 177.8 (H) 11/29/2016 1657    Roxan Hockey M.D on 06/16/2020 at 5:30 PM  Go to [email or web address] - for contact info  [facility] - Office  [phone]

## 2020-06-16 NOTE — Care Management Note (Signed)
RE: [name] DOB: [birth year] Date: 06/16/2020 [place] MUST ID: [id number]  To Whom It May Concern:  Please be advised that the above name patient will require a short-term nursing home stay--anticipated 30 days or less rehabilitation and strengthening. The plan is for return home.

## 2020-06-17 DIAGNOSIS — A0472 Enterocolitis due to Clostridium difficile, not specified as recurrent: Principal | ICD-10-CM

## 2020-06-17 LAB — CBC
HCT: 25.7 % — ABNORMAL LOW (ref 36.0–46.0)
Hemoglobin: 8.4 g/dL — ABNORMAL LOW (ref 12.0–15.0)
MCH: 27.2 pg (ref 26.0–34.0)
MCHC: 32.7 g/dL (ref 30.0–36.0)
MCV: 83.2 fL (ref 80.0–100.0)
Platelets: 355 10*3/uL (ref 150–400)
RBC: 3.09 MIL/uL — ABNORMAL LOW (ref 3.87–5.11)
RDW: 17.8 % — ABNORMAL HIGH (ref 11.5–15.5)
WBC: 14.8 10*3/uL — ABNORMAL HIGH (ref 4.0–10.5)
nRBC: 0 % (ref 0.0–0.2)

## 2020-06-17 LAB — RENAL FUNCTION PANEL
Albumin: 1.8 g/dL — ABNORMAL LOW (ref 3.5–5.0)
Anion gap: 8 (ref 5–15)
BUN: 16 mg/dL (ref 8–23)
CO2: 30 mmol/L (ref 22–32)
Calcium: 7.8 mg/dL — ABNORMAL LOW (ref 8.9–10.3)
Chloride: 97 mmol/L — ABNORMAL LOW (ref 98–111)
Creatinine, Ser: 1.22 mg/dL — ABNORMAL HIGH (ref 0.44–1.00)
GFR calc Af Amer: 49 mL/min — ABNORMAL LOW (ref >60)
GFR calc non Af Amer: 43 mL/min — ABNORMAL LOW (ref >60)
Glucose, Bld: 82 mg/dL (ref 70–99)
Phosphorus: 2.4 mg/dL — ABNORMAL LOW (ref 2.5–4.6)
Potassium: 4.5 mmol/L (ref 3.5–5.1)
Sodium: 135 mmol/L (ref 135–145)

## 2020-06-17 LAB — MAGNESIUM: Magnesium: 1.6 mg/dL — ABNORMAL LOW (ref 1.7–2.4)

## 2020-06-17 LAB — GLUCOSE, CAPILLARY
Glucose-Capillary: 63 mg/dL — ABNORMAL LOW (ref 70–99)
Glucose-Capillary: 134 mg/dL — ABNORMAL HIGH (ref 70–99)
Glucose-Capillary: 164 mg/dL — ABNORMAL HIGH (ref 70–99)
Glucose-Capillary: 188 mg/dL — ABNORMAL HIGH (ref 70–99)

## 2020-06-17 MED ORDER — ZINC OXIDE 40 % EX OINT
TOPICAL_OINTMENT | Freq: Three times a day (TID) | CUTANEOUS | Status: DC
  Administered 2020-06-20: 1 via TOPICAL
  Filled 2020-06-17: qty 57

## 2020-06-17 MED ORDER — MAGNESIUM SULFATE 2 GM/50ML IV SOLN
2.0000 g | Freq: Once | INTRAVENOUS | Status: AC
  Administered 2020-06-17: 2 g via INTRAVENOUS
  Filled 2020-06-17: qty 50

## 2020-06-17 MED ORDER — SODIUM BICARBONATE 8.4 % IV SOLN
INTRAVENOUS | Status: AC
  Filled 2020-06-17: qty 150

## 2020-06-17 NOTE — Consult Note (Signed)
WOC Nurse Consult Note: Patient receiving care in [facility].  Consult completed remotely after review of record including images of wounds. 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  The shin wound highly likely related to an abrasion. Pressure Injury POA: Yes/No/NA Measurement: To be provided by the bedside RN in the flowsheet section Wound bed: see photos Drainage (amount, consistency, odor)  Periwound: Dressing procedure/placement/frequency:  Cleanse wounds on bilateral perineum and right shin with soap and water. Place a small piece of Xeroform gauze Kellie Simmering [phone]) over the wounds, then a small foam dressing.   Desitin 3 x daily for the scattered, superficial buttocks areas. Monitor the wound area(s) for worsening of condition such as: Signs/symptoms of infection,  Increase in size,  Development of or worsening of odor, Development of pain, or increased pain at the affected locations.  Notify the medical team if any of these develop.  Thank you for the consult. [place] nurse will not follow at this time.  Please re-consult the [place] team if needed.  Val Riles, RN, MSN, CWOCN, CNS-BC, pager [phone]

## 2020-06-17 NOTE — TOC Progression Note (Signed)
Transition of Care [facility]) - Progression Note    Patient Details  Name: [name] MRN: [id number] Date of Birth: May 01, 1941  [facility] CM/SW Contact  Boneta Lucks, RN Phone Number: 06/17/2020, 2:24 PM  Clinical Narrative:   [facility] made a bed offer. Patient accepted and TOC is starting INS Auth.  Patient did say she will discuss with her daughter going home at discharge.  She has had both COVID vaccines. Will need a new COVID test at discharge.       Expected Discharge Plan: [place] Barriers to Discharge: Continued Medical Work up, Ship broker  Expected Discharge Plan and Services Expected Discharge Plan: [place]       Living arrangements for the past 2 months: [place]

## 2020-06-17 NOTE — Care Management Important Message (Signed)
Important Message  Patient Details  Name: [name] MRN: [id number] Date of Birth: [birth year]   Medicare Important Message Given:  Yes (RN delivered due to contact precautions)     Tommy Medal 06/17/2020, 4:25 PM

## 2020-06-18 DIAGNOSIS — A0472 Enterocolitis due to Clostridium difficile, not specified as recurrent: Secondary | ICD-10-CM | POA: Diagnosis present

## 2020-06-18 LAB — BASIC METABOLIC PANEL
Anion gap: 7 (ref 5–15)
BUN: 16 mg/dL (ref 8–23)
CO2: 32 mmol/L (ref 22–32)
Calcium: 7.6 mg/dL — ABNORMAL LOW (ref 8.9–10.3)
Chloride: 95 mmol/L — ABNORMAL LOW (ref 98–111)
Creatinine, Ser: 1.27 mg/dL — ABNORMAL HIGH (ref 0.44–1.00)
GFR calc Af Amer: 47 mL/min — ABNORMAL LOW (ref >60)
GFR calc non Af Amer: 41 mL/min — ABNORMAL LOW (ref >60)
Glucose, Bld: 119 mg/dL — ABNORMAL HIGH (ref 70–99)
Potassium: 4.3 mmol/L (ref 3.5–5.1)
Sodium: 134 mmol/L — ABNORMAL LOW (ref 135–145)

## 2020-06-18 LAB — GLUCOSE, CAPILLARY
Glucose-Capillary: 87 mg/dL (ref 70–99)
Glucose-Capillary: 145 mg/dL — ABNORMAL HIGH (ref 70–99)
Glucose-Capillary: 199 mg/dL — ABNORMAL HIGH (ref 70–99)
Glucose-Capillary: 211 mg/dL — ABNORMAL HIGH (ref 70–99)

## 2020-06-18 LAB — CBC
HCT: 25.5 % — ABNORMAL LOW (ref 36.0–46.0)
Hemoglobin: 8.3 g/dL — ABNORMAL LOW (ref 12.0–15.0)
MCH: 26.9 pg (ref 26.0–34.0)
MCHC: 32.5 g/dL (ref 30.0–36.0)
MCV: 82.5 fL (ref 80.0–100.0)
Platelets: 400 10*3/uL (ref 150–400)
RBC: 3.09 MIL/uL — ABNORMAL LOW (ref 3.87–5.11)
RDW: 18 % — ABNORMAL HIGH (ref 11.5–15.5)
WBC: 16.4 10*3/uL — ABNORMAL HIGH (ref 4.0–10.5)
nRBC: 0.1 % (ref 0.0–0.2)

## 2020-06-18 LAB — MAGNESIUM: Magnesium: 1.8 mg/dL (ref 1.7–2.4)

## 2020-06-18 NOTE — Progress Notes (Signed)
Patient Demographics:    [name], is a 79 y.o. female, DOB - April 02, 1941, UKG:[id number]  Admit date - 06/11/2020   Admitting Physician Kathie Dike, MD  Outpatient Primary MD for the patient is Fayrene Helper, MD  LOS - 6  Chief Complaint  Patient presents with  . Diarrhea        Subjective:    [name] today has no fevers,  No chest pain,    Daughter at bedside, patient complains of fatigue and lack of appetite -Nausea but no emesis  Assessment  & Plan :    Principal Problem:   C. difficile colitis Active Problems:   Hypothyroidism   Type 2 diabetes mellitus (HCC)   Hyperlipemia   IDA (iron deficiency anemia)   Depression with anxiety   Unspecified glaucoma   GERD   CKD stage 3 due to type 2 diabetes mellitus (HCC)   Obstructive sleep apnea   Myasthenia gravis in remission ([place])   Acute colitis   Hypokalemia   AKI (acute kidney injury) ([place])   Hypomagnesemia   Colitis  Brief Summary:- 79 y.o. female with medical history significant of normocytic anemia, anxiety, depression, osteoarthritis of the lumbar spine, carpal tunnel syndrome, chronic bronchitis, stage III CKD, COPD, diverticulosis, erosive esophagitis, GERD, unspecified headaches, hyperlipidemia, hypertension, mitral regurgitation, myasthenia gravis in remission, obesity OSA on CPAP, history of pneumonia, pulmonary hypertension, Schatzki's ring, seasonal allergies, shoulder pain, thyroid disease, tobacco abuse, type 2 diabetes mellitus with recurrent exposures to antibiotics due to repeated course of treatment for sinus infections and bronchitis admitted on 06/12/2020 with clinical and imaging findings consistent with acute colitis favor infectious etiology -Subsequent C. difficile testing positive -In summary patient's C. difficile colitis was present on admission (POA) , however testing for C. difficile colitis  was delayed as narrated below in # 1   1)Acute C. Difficile Colitis--- POA-- abdominal discomfort nausea and diarrhea persist, -Large-volume watery diarrhea persist -CT abdomen and pelvis on admission with significant colitis -Initially treated with IV Cipro and Flagyl since 06/12/2020, stopped on 06/16/2020 when C. difficile testing came back positive --Patient had significant watery large-volume diarrhea on admission, CT abdomen pelvis was consistent with colitis--- there was high clinical index of suspicion for C. difficile colitis on admission so an order for C. difficile testing and GI pathogen was placed on admission - -However stool sample sent to lab was tested only for GI pathogen but somehow the stool sample was  not tested for C. difficile colitis even though C diff testing was ordered on admission --Given persistent diarrhea and symptoms consistent with suspected C. difficile colitis on 06/16/2020 with the approval of ID physician Dr. Johnnye Sima a  C. difficile stool sample was obtained which came back consistent with suspected C. difficile colitis -P.o. vancomycin initiated on 06/16/2020 based on these results, Cipro and Flagyl discontinued on 06/16/2020 -In summary patient's C. difficile colitis was present on admission (POA) , however testing for C. difficile colitis was delayed as narrated here in # 1  -Patient will need outpatient GI follow-up for colonoscopy once acute issues have resolved. -Advance diet to soft -GI pathogen from 06/12/20 is  negative -WBC 16.4 down from 18.0 on 06/12/20  2)ABLA---??? is down to 8.3 from 11.2 query  acute blood loss versus hemodilution -Outpatient colonoscopy planned  3)Acute kidney injury superimposed on CKD stage IIIa--- Likely secondary to volume depletion from GI losses.    Creatinine is down to 1.27 from 1.68 after hydration chronic kidney disease stage III.     4)Myasthenia gravis---  Continue on pyridostigmine 3 times daily.  She is also on  chronic prednisone  5)Diabetes mellitus type 2--  P.o. intake has been poor and she has had episodes of hypoglycemia.  Hold oral agents and basal insulin onto oral intake is more reliable ---Continue on sliding scale insulin.  6)Hyperlipidemia.  Continue Crestor and Zetia  7)Hypothyroidism.  Continue on Synthroid  8)Non-anion gap metabolic acidosis.  Likely related to GI losses.  Resolved with bicarbonate infusion.  9)Right pelvic cystic mass, likely ovarian.  Incidental finding on CT.  Will need outpatient ovarian ultrasound once acute issues have resolved.---Complex cystic mass in the right hemipelvis likely associated with the right ovary measuring up to 3.9 cm. Follow-up with a nonemergent outpatient pelvic ultrasound is recommended. --- Needs Pelvic ultrasound and colonoscopy  10)Findings are concerning for an infectious or inflammatory colitis.. As an underlying mass cannot be excluded, a follow-up colonoscopy is recommended as an outpatient  11)Ulcer in Perineum--Lt > Rt--- looks like pressure ulcers from thighs rubbing together--- --please see photos in epic, wound care consult requested  Generalized weakness and deconditioning--- PT eval appreciated recommends SNF rehab  Status is: Inpatient  Remains inpatient appropriate because:Awaiting insurance approval for transfer to SNF rehab   Disposition: The patient is from: Home              Anticipated d/c is to: SNF              Anticipated d/c date is: 2 days              Patient currently is not medically stable to d/c. Barriers: Awaiting insurance approval for transfer to SNF rehab  Code Status : full  Family Communication:  (patient is alert, awake and coherent)  Discussed with daughter [name]  Consults  :  na  DVT Prophylaxis  :  - SCDs (hgb dropped)  Lab Results  Component Value Date   PLT 400 06/18/2020    Inpatient Medications  Scheduled Meds: . brimonidine  1 drop Both Eyes Q12H   And  . timolol  1  drop Both Eyes Q12H  . citalopram  10 mg Oral Daily  . cycloSPORINE  1 drop Both Eyes BID  . darifenacin  15 mg Oral Daily  . ezetimibe  10 mg Oral Daily  . gabapentin  300 mg Oral BID  . insulin aspart  0-5 Units Subcutaneous QHS  . insulin aspart  0-6 Units Subcutaneous TID WC  . liver oil-zinc oxide   Topical TID  . multivitamin with minerals  1 tablet Oral Daily  . nystatin   Topical TID  . pantoprazole  40 mg Oral Daily  . predniSONE  5 mg Oral Daily  . pyridostigmine  60 mg Oral TID  . rosuvastatin  40 mg Oral q1800  . vancomycin  125 mg Oral QID  . venlafaxine XR  150 mg Oral Daily   Continuous Infusions: . dextrose 5 % 1,000 mL with potassium chloride 20 mEq, sodium bicarbonate 150 mEq infusion 40 mL/hr at 06/18/20 1319   PRN Meds:.acetaminophen **OR** acetaminophen, HYDROcodone-acetaminophen, prochlorperazine, white petrolatum    Anti-infectives (From admission, onward)   Start     Dose/Rate Route Frequency Ordered Stop  06/16/20 1000  vancomycin (VANCOCIN) 50 mg/mL oral solution 125 mg     Discontinue     125 mg Oral 4 times daily 06/16/20 0947 06/26/20 0959   06/12/20 1600  ciprofloxacin (CIPRO) IVPB 400 mg  Status:  Discontinued        400 mg 200 mL/hr over 60 Minutes Intravenous Every 12 hours 06/12/20 0515 06/16/20 0947   06/12/20 1400  metroNIDAZOLE (FLAGYL) IVPB 500 mg  Status:  Discontinued        500 mg 100 mL/hr over 60 Minutes Intravenous Every 8 hours 06/12/20 0535 06/16/20 0947   06/12/20 0530  metroNIDAZOLE (FLAGYL) IVPB 500 mg  Status:  Discontinued        500 mg 100 mL/hr over 60 Minutes Intravenous Every 8 hours 06/12/20 0515 06/12/20 0535   06/12/20 0500  ciprofloxacin (CIPRO) IVPB 400 mg        400 mg 200 mL/hr over 60 Minutes Intravenous  Once 06/12/20 0447 06/12/20 0639   06/12/20 0500  metroNIDAZOLE (FLAGYL) IVPB 500 mg        500 mg 100 mL/hr over 60 Minutes Intravenous  Once 06/12/20 0447 06/12/20 0639        Objective:   Vitals:    06/17/20 2053 06/17/20 2054 06/18/20 0509 06/18/20 1453  BP: (!) 129/44  (!) 118/53 (!) 127/58  Pulse: 66  65 67  Resp: 17  18 18   Temp: 97.6 F (36.4 C)  97.6 F (36.4 C) 98 F (36.7 C)  TempSrc: Oral  Oral Oral  SpO2: 100% 100% 98% 100%  Weight:      Height:        Wt Readings from Last 3 Encounters:  06/12/20 104.1 kg  05/20/20 106.6 kg  04/29/20 106.7 kg     Intake/Output Summary (Last 24 hours) at 06/18/2020 1703 Last data filed at 06/18/2020 0555 Gross per 24 hour  Intake --  Output 200 ml  Net -200 ml     Physical Exam  Gen:- Awake Alert, in no acute distress HEENT:- .AT, No sclera icterus Neck-Supple Neck,No JVD,.  Lungs-  CTAB , fair symmetrical air movement CV- S1, S2 normal, regular  Abd-  +ve B.Sounds, Abd Soft, abdominal discomfort on palpation without significant tenderness per se    Extremity/Skin:- No  edema, pedal pulses present  Psych-affect is appropriate, oriented x3 Neuro-generalized weakness, no new focal deficits, no tremors GU- Ulcer in Perineum--Lt > Rt--- looks like pressure ulcers from thighs rubbing together--- please see photos in epic   Data Review:   Micro Results Recent Results (from the past 240 hour(s))  Gastrointestinal Panel by PCR , Stool     Status: None   Collection Time: 06/12/20 12:50 AM   Specimen: Rectum; Stool  Result Value Ref Range Status   Campylobacter species NOT DETECTED NOT DETECTED Final   Plesimonas shigelloides NOT DETECTED NOT DETECTED Final   Salmonella species NOT DETECTED NOT DETECTED Final   Yersinia enterocolitica NOT DETECTED NOT DETECTED Final   Vibrio species NOT DETECTED NOT DETECTED Final   Vibrio cholerae NOT DETECTED NOT DETECTED Final   Enteroaggregative E coli (EAEC) NOT DETECTED NOT DETECTED Final   Enteropathogenic E coli (EPEC) NOT DETECTED NOT DETECTED Final   Enterotoxigenic E coli (ETEC) NOT DETECTED NOT DETECTED Final   Shiga like toxin producing E coli (STEC) NOT DETECTED NOT  DETECTED Final   Shigella/Enteroinvasive E coli (EIEC) NOT DETECTED NOT DETECTED Final   Cryptosporidium NOT DETECTED NOT DETECTED Final  Cyclospora cayetanensis NOT DETECTED NOT DETECTED Final   Entamoeba histolytica NOT DETECTED NOT DETECTED Final   Giardia lamblia NOT DETECTED NOT DETECTED Final   Adenovirus F40/41 NOT DETECTED NOT DETECTED Final   Astrovirus NOT DETECTED NOT DETECTED Final   Norovirus GI/GII NOT DETECTED NOT DETECTED Final   Rotavirus A NOT DETECTED NOT DETECTED Final   Sapovirus (I, II, IV, and V) NOT DETECTED NOT DETECTED Final    Comment: Performed at [facility], [address] [place], [place] [address]  SARS Coronavirus 2 by RT PCR (hospital order, performed in [facility] hospital lab) Nasopharyngeal Nasopharyngeal Swab     Status: None   Collection Time: 06/12/20  4:48 AM   Specimen: Nasopharyngeal Swab  Result Value Ref Range Status   SARS Coronavirus 2 NEGATIVE NEGATIVE Final    Comment: (NOTE) SARS-CoV-2 target nucleic acids are NOT DETECTED.  The SARS-CoV-2 RNA is generally detectable in upper and lower respiratory specimens during the acute phase of infection. The lowest concentration of SARS-CoV-2 viral copies this assay can detect is 250 copies / mL. A negative result does not preclude SARS-CoV-2 infection and should not be used as the sole basis for treatment or other patient management decisions.  A negative result may occur with improper specimen collection / handling, submission of specimen other than nasopharyngeal swab, presence of viral mutation(s) within the areas targeted by this assay, and inadequate number of viral copies (<250 copies / mL). A negative result must be combined with clinical observations, patient history, and epidemiological information.  Fact Sheet for Patients:   StrictlyIdeas.no  Fact Sheet for Healthcare Providers: BankingDealers.co.za  This test is not  yet approved or  cleared by the [place] FDA and has been authorized for detection and/or diagnosis of SARS-CoV-2 by FDA under an Emergency Use Authorization (EUA).  This EUA will remain in effect (meaning this test can be used) for the duration of the COVID-19 declaration under Section 564(b)(1) of the Act, 21 U.S.C. section 360bbb-3(b)(1), unless the authorization is terminated or revoked sooner.  Performed at [facility], [address] [place], [place] [address]   C Difficile Quick Screen w PCR reflex     Status: Abnormal   Collection Time: 06/15/20  6:08 PM   Specimen: STOOL  Result Value Ref Range Status   C Diff antigen POSITIVE (A) NEGATIVE Final   C Diff toxin NEGATIVE NEGATIVE Final   C Diff interpretation Results are indeterminate. See PCR results.  Final    Comment: Performed at [facility], [address] [place], [place] [address]  C. Diff by PCR, Reflexed     Status: Abnormal   Collection Time: 06/15/20  6:08 PM  Result Value Ref Range Status   Toxigenic C. Difficile by PCR POSITIVE (A) NEGATIVE Final    Comment: Positive for toxigenic C. difficile with little to no toxin production. Only treat if clinical presentation suggests symptomatic illness. Performed at [facility], [place] [address] [place], [place] [address]     Radiology Reports CT ABDOMEN PELVIS WO CONTRAST  Result Date: 06/12/2020 CLINICAL DATA:  Left lower quadrant abdominal pain. EXAM: CT ABDOMEN AND PELVIS WITHOUT CONTRAST TECHNIQUE: Multidetector CT imaging of the abdomen and pelvis was performed following the standard protocol without IV contrast. COMPARISON:  None. FINDINGS: Lower chest: The lung bases are clear. The heart size is normal. Hepatobiliary: The liver is normal. Status post cholecystectomy.There is no biliary ductal dilation. Pancreas: Normal contours without ductal dilatation. No peripancreatic  fluid collection. Spleen: Unremarkable. Adrenals/Urinary Tract: --Adrenal glands:  Unremarkable. --Right kidney/ureter: There is a nonobstructing stone in the upper pole the right kidney. --Left kidney/ureter: No hydronephrosis or radiopaque kidney stones. --Urinary bladder: Unremarkable. Stomach/Bowel: --Stomach/Duodenum: There is a large hiatal hernia. --Small bowel: Unremarkable. --Colon: There is diffuse circumferential wall thickening of much of the descending colon and sigmoid colon. 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 There are enlarged adjacent regional lymph nodes. There is a moderate amount of stool in the remaining portions of the colon. There may be a single inflamed diverticulum in the ascending colon (axial series 2, image 37). --Appendix: Not visualized. No right lower quadrant inflammation or free fluid. Vascular/Lymphatic: Atherosclerotic calcification is present within the non-aneurysmal abdominal aorta, without hemodynamically significant stenosis. --No retroperitoneal lymphadenopathy. --there are mildly enlarged mesenteric lymph nodes. There are multiple enlarged regional lymph nodes along the course of the descending colon. --No pelvic or inguinal lymphadenopathy. Reproductive: There is a complex cystic mass in the right hemipelvis likely associated with the right ovary measuring 3.9 x 2.6 cm (axial series 2, image 69). There is a small cystic structure involving the left ovary measuring approximately 1.8 cm. The patient is status post prior hysterectomy. Other: No ascites or free air. The abdominal wall is normal. Musculoskeletal. No acute displaced fractures. IMPRESSION: 1. Diffuse circumferential wall thickening of much of the descending colon and sigmoid colon with multiple enlarged adjacent regional lymph nodes. Findings are concerning for an infectious or inflammatory colitis. Correlation with patient's symptoms and laboratory studies is recommended. As an underlying mass cannot be excluded, a follow-up colonoscopy is recommended as an outpatient. 2. Complex cystic  mass in the right hemipelvis likely associated with the right ovary measuring up to 3.9 cm. Follow-up with a nonemergent outpatient pelvic ultrasound is recommended. 3. Large hiatal hernia. 4. Nonobstructive right nephrolithiasis. Aortic Atherosclerosis (ICD10-I70.0). Electronically Signed   By: Constance Holster M.D.   On: 06/12/2020 04:00     CBC Recent Labs  Lab 06/12/20 0020 06/12/20 0020 06/12/20 0915 06/12/20 0915 06/13/20 0537 06/14/20 0636 06/15/20 0801 06/17/20 0641 06/18/20 1000  WBC 17.7*   < > 18.0*   < > 15.3* 13.9* 15.9* 14.8* 16.4*  HGB 9.1*   < > 9.2*   < > 7.9* 8.0* 8.0* 8.4* 8.3*  HCT 29.0*   < > 29.9*   < > 24.2* 25.1* 24.8* 25.7* 25.5*  PLT 560*   < > 576*   < > 453* 415* 409* 355 400  MCV 84.8   < > 89.0   < > 82.6 84.2 82.4 83.2 82.5  MCH 26.6   < > 27.4   < > 27.0 26.8 26.6 27.2 26.9  MCHC 31.4   < > 30.8   < > 32.6 31.9 32.3 32.7 32.5  RDW 17.6*   < > 18.3*   < > 17.0* 17.3* 17.4* 17.8* 18.0*  LYMPHSABS 0.9  --  0.8  --  1.5 1.4 1.4  --   --   MONOABS 1.4*  --  1.5*  --  1.6* 1.6* 1.7*  --   --   EOSABS 0.2  --  0.2  --  0.5 0.4 0.4  --   --   BASOSABS 0.1  --  0.1  --  0.0 0.1 0.1  --   --    < > = values in this interval not displayed.    [place]  Lab 06/12/20 0020 06/12/20 0900 06/13/20 0537 06/13/20 0537 06/14/20 0636  06/15/20 0801 06/16/20 0515 06/17/20 0641 06/18/20 1000  NA 135   < > 134*   < > 135 136 136 135 134*  K 3.2*   < > 3.3*   < > 3.2* 4.6 4.7 4.5 4.3  CL 98   < > 101   < > 98 99 100 97* 95*  CO2 24   < > 25   < > 29 29 28 30  32  GLUCOSE 250*   < > 101*   < > 99 80 69* 82 119*  BUN 35*   < > 24*   < > 22 20 18 16 16   CREATININE 1.68*   < > 1.18*   < > 1.21* 1.12* 1.22* 1.22* 1.27*  CALCIUM 8.2*   < > 7.4*   < > 7.3* 7.6* 7.8* 7.8* 7.6*  MG 1.5*  --   --   --   --   --   --  1.6* 1.8  AST 18  --  16  --   --   --   --   --   --   ALT 15  --  12  --   --   --   --   --   --   ALKPHOS 129*  --  107  --   --    --   --   --   --   BILITOT 0.8  --  0.6  --   --   --   --   --   --    < > = values in this interval not displayed.   ------------------------------------------------------------------------------------------------------------------ No results for input(s): CHOL, HDL, LDLCALC, TRIG, CHOLHDL, LDLDIRECT in the last 72 hours.  Lab Results  Component Value Date   HGBA1C 6.9 (H) 06/12/2020   ------------------------------------------------------------------------------------------------------------------ No results for input(s): TSH, T4TOTAL, T3FREE, THYROIDAB in the last 72 hours.  Invalid input(s): FREET3 ------------------------------------------------------------------------------------------------------------------ No results for input(s): VITAMINB12, FOLATE, FERRITIN, TIBC, IRON, RETICCTPCT in the last 72 hours.  Coagulation profile No results for input(s): INR, PROTIME in the last 168 hours.  No results for input(s): DDIMER in the last 72 hours.  Cardiac Enzymes No results for input(s): CKMB, TROPONINI, MYOGLOBIN in the last 168 hours.  Invalid input(s): CK ------------------------------------------------------------------------------------------------------------------    Component Value Date/Time   BNP 177.8 (H) 11/29/2016 1657    Roxan Hockey M.D on 06/18/2020 at 5:03 PM  Go to [email or web address] - for contact info  [facility] - Office  [phone]

## 2020-06-18 NOTE — Progress Notes (Signed)
Patient Demographics:    [name], is a 79 y.o. female, DOB - 01-Jun-1941, GEZ:[id number]  Admit date - 06/11/2020   Admitting Physician Kathie Dike, MD  Outpatient Primary MD for the patient is Fayrene Helper, MD  LOS - 6  Chief Complaint  Patient presents with  . Diarrhea        Subjective:    [name] today has no fevers,  No chest pain,    Late entry note Pt was seen 06/17/20  -Loose stools persist, appetite is not great, no emesis   Assessment  & Plan :    Principal Problem:   C. difficile colitis Active Problems:   Hypothyroidism   Type 2 diabetes mellitus (HCC)   Hyperlipemia   IDA (iron deficiency anemia)   Depression with anxiety   Unspecified glaucoma   GERD   CKD stage 3 due to type 2 diabetes mellitus (HCC)   Obstructive sleep apnea   Myasthenia gravis in remission ([place])   Acute colitis   Hypokalemia   AKI (acute kidney injury) ([place])   Hypomagnesemia   Colitis   1)Acute C. Difficile Colitis--- POA-- abdominal discomfort nausea and diarrhea persist, -Large-volume watery diarrhea persist -CT abdomen and pelvis on admission with significant colitis -Initially treated with IV Cipro and Flagyl since 06/12/2020, stopped on 06/16/2020 when C. difficile testing came back positive --Patient had significant watery large-volume diarrhea on admission, CT abdomen pelvis was consistent with colitis--- there was high clinical index of suspicion for C. difficile colitis on admission so an order for C. difficile testing and GI pathogen was placed on admission - -However stool sample sent to lab was tested only for GI pathogen but somehow the stool sample was  not tested for C. difficile colitis even though C diff testing was ordered on admission --Given persistent diarrhea and symptoms consistent with suspected C. difficile colitis on 06/16/2020 with the approval of ID  physician Dr. Johnnye Sima a  C. difficile stool sample was obtained which came back consistent with suspected C. difficile colitis -P.o. vancomycin initiated on 06/16/2020 based on these results, Cipro and Flagyl discontinued on 06/16/2020 -In summary patient's C. difficile colitis was present on admission (POA) , however testing for C. difficile colitis was delayed as narrated above  -Patient will need outpatient GI follow-up for colonoscopy once acute issues have resolved. -Advance diet to soft -GI pathogen from 06/12/20 is  negative -WBC 15.9 down from 18.0 on 06/12/20  2)ABLA---??? is down to 8.0 from 11.2 query acute blood loss versus hemodilution -Outpatient colonoscopy planned  3)Acute kidney injury superimposed on CKD stage IIIa--- Likely secondary to volume depletion from GI losses.    Creatinine is down to 1.1 from 1.68 after hydration chronic kidney disease stage III.     4)Myasthenia gravis---  Continue on pyridostigmine 3 times daily.  She is also on chronic prednisone  5)Diabetes mellitus type 2--  P.o. intake has been poor and she has had episodes of hypoglycemia.  Hold oral agents and basal insulin onto oral intake is more reliable ---Continue on sliding scale insulin.  6)Hyperlipidemia.  Continue Crestor and Zetia  7)Hypothyroidism.  Continue on Synthroid  8)Non-anion gap metabolic acidosis.  Likely related to GI losses.  Resolved with bicarbonate infusion.  9)Right pelvic cystic mass, likely ovarian.  Incidental finding on CT.  Will need outpatient ovarian ultrasound once acute issues have resolved.---Complex cystic mass in the right hemipelvis likely associated with the right ovary measuring up to 3.9 cm. Follow-up with a nonemergent outpatient pelvic ultrasound is recommended. --- Needs Pelvic ultrasound and colonoscopy  10)Findings are concerning for an infectious or inflammatory colitis.. As an underlying mass cannot be excluded, a follow-up colonoscopy is recommended  as an outpatient  11)Ulcer in Perineum--Lt > Rt--- looks like pressure ulcers from thighs rubbing together--- --please see photos in epic, wound care consult requested  Generalized weakness and deconditioning--- PT eval appreciated recommends SNF rehab  Status is: Inpatient  Remains inpatient appropriate because:-frequent large-volume watery stools with high risk for dehydration and electrolyte abnormalities if IV fluids are discontinued   Disposition: The patient is from: Home              Anticipated d/c is to: SNF              Anticipated d/c date is: 2 days              Patient currently is not medically stable to d/c. Barriers: Not Clinically Stable- -frequent large-volume watery stools with high risk for dehydration and electrolyte abnormalities if IV fluids are discontinued  Code Status : full  Family Communication:  (patient is alert, awake and coherent)  Discussed with daughter [name]  Consults  :  na  DVT Prophylaxis  :  - SCDs (hgb dropped)  Lab Results  Component Value Date   PLT 355 06/17/2020    Inpatient Medications  Scheduled Meds: . brimonidine  1 drop Both Eyes Q12H   And  . timolol  1 drop Both Eyes Q12H  . citalopram  10 mg Oral Daily  . cycloSPORINE  1 drop Both Eyes BID  . darifenacin  15 mg Oral Daily  . ezetimibe  10 mg Oral Daily  . gabapentin  300 mg Oral BID  . insulin aspart  0-5 Units Subcutaneous QHS  . insulin aspart  0-6 Units Subcutaneous TID WC  . liver oil-zinc oxide   Topical TID  . multivitamin with minerals  1 tablet Oral Daily  . nystatin   Topical TID  . pantoprazole  40 mg Oral Daily  . predniSONE  5 mg Oral Daily  . pyridostigmine  60 mg Oral TID  . rosuvastatin  40 mg Oral q1800  . vancomycin  125 mg Oral QID  . venlafaxine XR  150 mg Oral Daily   Continuous Infusions: . dextrose 5 % 1,000 mL with potassium chloride 20 mEq, sodium bicarbonate 150 mEq infusion 40 mL/hr at 06/17/20 0656   PRN Meds:.acetaminophen **OR**  acetaminophen, HYDROcodone-acetaminophen, prochlorperazine, white petrolatum    Anti-infectives (From admission, onward)   Start     Dose/Rate Route Frequency Ordered Stop   06/16/20 1000  vancomycin (VANCOCIN) 50 mg/mL oral solution 125 mg     Discontinue     125 mg Oral 4 times daily 06/16/20 0947 06/26/20 0959   06/12/20 1600  ciprofloxacin (CIPRO) IVPB 400 mg  Status:  Discontinued        400 mg 200 mL/hr over 60 Minutes Intravenous Every 12 hours 06/12/20 0515 06/16/20 0947   06/12/20 1400  metroNIDAZOLE (FLAGYL) IVPB 500 mg  Status:  Discontinued        500 mg 100 mL/hr over 60 Minutes Intravenous Every 8 hours 06/12/20 0535 06/16/20 0947   06/12/20 0530  metroNIDAZOLE (FLAGYL) IVPB 500 mg  Status:  Discontinued        500 mg 100 mL/hr over 60 Minutes Intravenous Every 8 hours 06/12/20 0515 06/12/20 0535   06/12/20 0500  ciprofloxacin (CIPRO) IVPB 400 mg        400 mg 200 mL/hr over 60 Minutes Intravenous  Once 06/12/20 0447 06/12/20 0639   06/12/20 0500  metroNIDAZOLE (FLAGYL) IVPB 500 mg        500 mg 100 mL/hr over 60 Minutes Intravenous  Once 06/12/20 0447 06/12/20 0639        Objective:   Vitals:   06/17/20 1510 06/17/20 2053 06/17/20 2054 06/18/20 0509  BP: (!) 130/51 (!) 129/44  (!) 118/53  Pulse: (!) 57 66  65  Resp: 16 17  18   Temp: 98 F (36.7 C) 97.6 F (36.4 C)  97.6 F (36.4 C)  TempSrc: Oral Oral  Oral  SpO2: 98% 100% 100% 98%  Weight:      Height:        Wt Readings from Last 3 Encounters:  06/12/20 104.1 kg  05/20/20 106.6 kg  04/29/20 106.7 kg     Intake/Output Summary (Last 24 hours) at 06/18/2020 0833 Last data filed at 06/18/2020 0555 Gross per 24 hour  Intake 1955 ml  Output 200 ml  Net 1755 ml     Physical Exam  Gen:- Awake Alert, in no acute distress HEENT:- [place].AT, No sclera icterus Neck-Supple Neck,No JVD,.  Lungs-  CTAB , fair symmetrical air movement CV- S1, S2 normal, regular  Abd-  +ve B.Sounds, Abd Soft, abdominal  discomfort on palpation without significant tenderness per se    Extremity/Skin:- No  edema, pedal pulses present  Psych-affect is appropriate, oriented x3 Neuro-generalized weakness, no new focal deficits, no tremors GU- Ulcer in Perineum--Lt > Rt--- looks like pressure ulcers from thighs rubbing together--- please see photos in epic   Data Review:   Micro Results Recent Results (from the past 240 hour(s))  Gastrointestinal Panel by PCR , Stool     Status: None   Collection Time: 06/12/20 12:50 AM   Specimen: Rectum; Stool  Result Value Ref Range Status   Campylobacter species NOT DETECTED NOT DETECTED Final   Plesimonas shigelloides NOT DETECTED NOT DETECTED Final   Salmonella species NOT DETECTED NOT DETECTED Final   Yersinia enterocolitica NOT DETECTED NOT DETECTED Final   Vibrio species NOT DETECTED NOT DETECTED Final   Vibrio cholerae NOT DETECTED NOT DETECTED Final   Enteroaggregative E coli (EAEC) NOT DETECTED NOT DETECTED Final   Enteropathogenic E coli (EPEC) NOT DETECTED NOT DETECTED Final   Enterotoxigenic E coli (ETEC) NOT DETECTED NOT DETECTED Final   Shiga like toxin producing E coli (STEC) NOT DETECTED NOT DETECTED Final   Shigella/Enteroinvasive E coli (EIEC) NOT DETECTED NOT DETECTED Final   Cryptosporidium NOT DETECTED NOT DETECTED Final   Cyclospora cayetanensis NOT DETECTED NOT DETECTED Final   Entamoeba histolytica NOT DETECTED NOT DETECTED Final   Giardia lamblia NOT DETECTED NOT DETECTED Final   Adenovirus F40/41 NOT DETECTED NOT DETECTED Final   Astrovirus NOT DETECTED NOT DETECTED Final   Norovirus GI/GII NOT DETECTED NOT DETECTED Final   Rotavirus A NOT DETECTED NOT DETECTED Final   Sapovirus (I, II, IV, and V) NOT DETECTED NOT DETECTED Final    Comment: Performed at [facility] [address] [place], [place] [address]  SARS Coronavirus 2 by RT PCR (hospital order, performed in [facility] hospital lab) Nasopharyngeal Nasopharyngeal  Swab      Status: None   Collection Time: 06/12/20  4:48 AM   Specimen: Nasopharyngeal Swab  Result Value Ref Range Status   SARS Coronavirus 2 NEGATIVE NEGATIVE Final    Comment: (NOTE) SARS-CoV-2 target nucleic acids are NOT DETECTED.  The SARS-CoV-2 RNA is generally detectable in upper and lower respiratory specimens during the acute phase of infection. The lowest concentration of SARS-CoV-2 viral copies this assay can detect is 250 copies / mL. A negative result does not preclude SARS-CoV-2 infection and should not be used as the sole basis for treatment or other patient management decisions.  A negative result may occur with improper specimen collection / handling, submission of specimen other than nasopharyngeal swab, presence of viral mutation(s) within the areas targeted by this assay, and inadequate number of viral copies (<250 copies / mL). A negative result must be combined with clinical observations, patient history, and epidemiological information.  Fact Sheet for Patients:   StrictlyIdeas.no  Fact Sheet for Healthcare Providers: BankingDealers.co.za  This test is not yet approved or  cleared by the [place] FDA and has been authorized for detection and/or diagnosis of SARS-CoV-2 by FDA under an Emergency Use Authorization (EUA).  This EUA will remain in effect (meaning this test can be used) for the duration of the COVID-19 declaration under Section 564(b)(1) of the Act, 21 U.S.C. section 360bbb-3(b)(1), unless the authorization is terminated or revoked sooner.  Performed at [facility], [address], [place], [place] [address]   C Difficile Quick Screen w PCR reflex     Status: Abnormal   Collection Time: 06/15/20  6:08 PM   Specimen: STOOL  Result Value Ref Range Status   C Diff antigen POSITIVE (A) NEGATIVE Final   C Diff toxin NEGATIVE NEGATIVE Final   C Diff interpretation Results are indeterminate. See PCR  results.  Final    Comment: Performed at [facility], [address] [place], [place] [address]  C. Diff by PCR, Reflexed     Status: Abnormal   Collection Time: 06/15/20  6:08 PM  Result Value Ref Range Status   Toxigenic C. Difficile by PCR POSITIVE (A) NEGATIVE Final    Comment: Positive for toxigenic C. difficile with little to no toxin production. Only treat if clinical presentation suggests symptomatic illness. Performed at [facility], [address] [place], [place] [address]     Radiology Reports CT ABDOMEN PELVIS WO CONTRAST  Result Date: 06/12/2020 CLINICAL DATA:  Left lower quadrant abdominal pain. EXAM: CT ABDOMEN AND PELVIS WITHOUT CONTRAST TECHNIQUE: Multidetector CT imaging of the abdomen and pelvis was performed following the standard protocol without IV contrast. COMPARISON:  None. FINDINGS: Lower chest: The lung bases are clear. The heart size is normal. Hepatobiliary: The liver is normal. Status post cholecystectomy.There is no biliary ductal dilation. Pancreas: Normal contours without ductal dilatation. No peripancreatic fluid collection. Spleen: Unremarkable. Adrenals/Urinary Tract: --Adrenal glands: Unremarkable. --Right kidney/ureter: There is a nonobstructing stone in the upper pole the right kidney. --Left kidney/ureter: No hydronephrosis or radiopaque kidney stones. --Urinary bladder: Unremarkable. Stomach/Bowel: --Stomach/Duodenum: There is a large hiatal hernia. --Small bowel: Unremarkable. --Colon: There is diffuse circumferential wall thickening of much of the descending colon and sigmoid colon. 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 There are enlarged adjacent regional lymph nodes. There is a moderate amount of stool in the remaining portions of the colon. There may be a single inflamed diverticulum in the ascending colon (axial series 2, image 37). --Appendix: Not  visualized. No right lower quadrant inflammation or free fluid. Vascular/Lymphatic:  Atherosclerotic calcification is present within the non-aneurysmal abdominal aorta, without hemodynamically significant stenosis. --No retroperitoneal lymphadenopathy. --there are mildly enlarged mesenteric lymph nodes. There are multiple enlarged regional lymph nodes along the course of the descending colon. --No pelvic or inguinal lymphadenopathy. Reproductive: There is a complex cystic mass in the right hemipelvis likely associated with the right ovary measuring 3.9 x 2.6 cm (axial series 2, image 69). There is a small cystic structure involving the left ovary measuring approximately 1.8 cm. The patient is status post prior hysterectomy. Other: No ascites or free air. The abdominal wall is normal. Musculoskeletal. No acute displaced fractures. IMPRESSION: 1. Diffuse circumferential wall thickening of much of the descending colon and sigmoid colon with multiple enlarged adjacent regional lymph nodes. Findings are concerning for an infectious or inflammatory colitis. Correlation with patient's symptoms and laboratory studies is recommended. As an underlying mass cannot be excluded, a follow-up colonoscopy is recommended as an outpatient. 2. Complex cystic mass in the right hemipelvis likely associated with the right ovary measuring up to 3.9 cm. Follow-up with a nonemergent outpatient pelvic ultrasound is recommended. 3. Large hiatal hernia. 4. Nonobstructive right nephrolithiasis. Aortic Atherosclerosis (ICD10-I70.0). Electronically Signed   By: Constance Holster M.D.   On: 06/12/2020 04:00     CBC Recent Labs  Lab 06/12/20 0020 06/12/20 0020 06/12/20 0915 06/13/20 0537 06/14/20 0636 06/15/20 0801 06/17/20 0641  WBC 17.7*   < > 18.0* 15.3* 13.9* 15.9* 14.8*  HGB 9.1*   < > 9.2* 7.9* 8.0* 8.0* 8.4*  HCT 29.0*   < > 29.9* 24.2* 25.1* 24.8* 25.7*  PLT 560*   < > 576* 453* 415* 409* 355  MCV 84.8   < > 89.0 82.6 84.2 82.4 83.2  MCH 26.6   < > 27.4 27.0 26.8 26.6 27.2  MCHC 31.4   < > 30.8 32.6 31.9  32.3 32.7  RDW 17.6*   < > 18.3* 17.0* 17.3* 17.4* 17.8*  LYMPHSABS 0.9  --  0.8 1.5 1.4 1.4  --   MONOABS 1.4*  --  1.5* 1.6* 1.6* 1.7*  --   EOSABS 0.2  --  0.2 0.5 0.4 0.4  --   BASOSABS 0.1  --  0.1 0.0 0.1 0.1  --    < > = values in this interval not displayed.    Chemistries  Recent Labs  Lab 06/12/20 0020 06/12/20 0900 06/13/20 0537 06/14/20 0636 06/15/20 0801 06/16/20 0515 06/17/20 0641  NA 135   < > 134* 135 136 136 135  K 3.2*   < > 3.3* 3.2* 4.6 4.7 4.5  CL 98   < > 101 98 99 100 97*  CO2 24   < > 25 29 29 28 30   GLUCOSE 250*   < > 101* 99 80 69* 82  BUN 35*   < > 24* 22 20 18 16   CREATININE 1.68*   < > 1.18* 1.21* 1.12* 1.22* 1.22*  CALCIUM 8.2*   < > 7.4* 7.3* 7.6* 7.8* 7.8*  MG 1.5*  --   --   --   --   --  1.6*  AST 18  --  16  --   --   --   --   ALT 15  --  12  --   --   --   --   ALKPHOS 129*  --  107  --   --   --   --  BILITOT 0.8  --  0.6  --   --   --   --    < > = values in this interval not displayed.   ------------------------------------------------------------------------------------------------------------------ No results for input(s): CHOL, HDL, LDLCALC, TRIG, CHOLHDL, LDLDIRECT in the last 72 hours.  Lab Results  Component Value Date   HGBA1C 6.9 (H) 06/12/2020   ------------------------------------------------------------------------------------------------------------------ No results for input(s): TSH, T4TOTAL, T3FREE, THYROIDAB in the last 72 hours.  Invalid input(s): FREET3 ------------------------------------------------------------------------------------------------------------------ No results for input(s): VITAMINB12, FOLATE, FERRITIN, TIBC, IRON, RETICCTPCT in the last 72 hours.  Coagulation profile No results for input(s): INR, PROTIME in the last 168 hours.  No results for input(s): DDIMER in the last 72 hours.  Cardiac Enzymes No results for input(s): CKMB, TROPONINI, MYOGLOBIN in the last 168 hours.  Invalid  input(s): CK ------------------------------------------------------------------------------------------------------------------    Component Value Date/Time   BNP 177.8 (H) 11/29/2016 1657    Late entry note Pt was seen 06/17/20   Roxan Hockey M.D on 06/18/2020 at 8:33 AM  Go to [email or web address] - for contact info  [facility] - Office  [phone]

## 2020-06-19 LAB — GLUCOSE, CAPILLARY
Glucose-Capillary: 74 mg/dL (ref 70–99)
Glucose-Capillary: 193 mg/dL — ABNORMAL HIGH (ref 70–99)
Glucose-Capillary: 218 mg/dL — ABNORMAL HIGH (ref 70–99)
Glucose-Capillary: 227 mg/dL — ABNORMAL HIGH (ref 70–99)

## 2020-06-19 NOTE — Progress Notes (Signed)
Patient Demographics:    [name], is a 79 y.o. female, DOB - 1941/03/23, GNF:[id number]  Admit date - 06/11/2020   Admitting Physician Kathie Dike, MD  Outpatient Primary MD for the patient is Fayrene Helper, MD  LOS - 7  Chief Complaint  Patient presents with  . Diarrhea        Subjective:    [name] today has no fevers,  No chest pain,   -Stools are less frequent, volume of stools also less -Appetite is not great  Assessment  & Plan :    Principal Problem:   C. difficile colitis Active Problems:   Hypothyroidism   Type 2 diabetes mellitus (HCC)   Hyperlipemia   IDA (iron deficiency anemia)   Depression with anxiety   Unspecified glaucoma   GERD   CKD stage 3 due to type 2 diabetes mellitus (HCC)   Obstructive sleep apnea   Myasthenia gravis in remission ([name])   Acute colitis   Hypokalemia   AKI (acute kidney injury) ([place])   Hypomagnesemia   Colitis  Brief Summary:- 79 y.o. female with medical history significant of normocytic anemia, anxiety, depression, osteoarthritis of the lumbar spine, carpal tunnel syndrome, chronic bronchitis, stage III CKD, COPD, diverticulosis, erosive esophagitis, GERD, unspecified headaches, hyperlipidemia, hypertension, mitral regurgitation, myasthenia gravis in remission, obesity OSA on CPAP, history of pneumonia, pulmonary hypertension, Schatzki's ring, seasonal allergies, shoulder pain, thyroid disease, tobacco abuse, type 2 diabetes mellitus with recurrent exposures to antibiotics due to repeated course of treatment for sinus infections and bronchitis admitted on 06/12/2020 with clinical and imaging findings consistent with acute colitis favor infectious etiology -Subsequent C. difficile testing positive -In summary patient's C. difficile colitis infection was present on admission (POA) , however testing for C. difficile colitis was  delayed as narrated below in # 1   1)Acute C. Difficile Colitis--- POA-- abdominal discomfort nausea and diarrhea persist, -Large-volume watery diarrhea persist -CT abdomen and pelvis on admission with significant colitis -Initially treated with IV Cipro and Flagyl since 06/12/2020, stopped on 06/16/2020 when C. difficile testing came back positive --Patient had significant watery large-volume diarrhea on admission, CT abdomen pelvis was consistent with colitis--- there was high clinical index of suspicion for C. difficile colitis on admission so an order for C. difficile testing and GI pathogen was placed on admission - -However stool sample sent to lab was tested only for GI pathogen but somehow the stool sample was  not tested for C. difficile colitis even though C diff testing was ordered on admission --Given persistent diarrhea and symptoms consistent with suspected C. difficile colitis on 06/16/2020 with the approval of ID physician Dr. Johnnye Sima a  C. difficile stool sample was obtained which came back consistent with suspected C. difficile colitis -P.o. vancomycin initiated on 06/16/2020 based on these results, Cipro and Flagyl discontinued on 06/16/2020 -In summary patient's C. difficile colitis was present on admission (POA) , however testing for C. difficile colitis was delayed as narrated here in # 1  -Patient will need outpatient GI follow-up for colonoscopy once acute issues have resolved. -Advance diet to soft -GI pathogen from 06/12/20 is  negative -WBC 16.4 down from 18.0 on 06/12/20  2)ABLA---??? is down to 8.3 from 11.2 query acute blood  loss versus hemodilution -Outpatient colonoscopy planned  3)Acute kidney injury superimposed on CKD stage IIIa--- Likely secondary to volume depletion from GI losses.    Creatinine is down to 1.27 from 1.68 after hydration chronic kidney disease stage III.     4)Myasthenia gravis---  Continue on pyridostigmine 3 times daily.  She is also on  chronic prednisone  5)Diabetes mellitus type 2--  P.o. intake has been poor and she has had episodes of hypoglycemia.  Hold oral agents and basal insulin onto oral intake is more reliable ---Continue on sliding scale insulin.  6)Hyperlipidemia.  Continue Crestor and Zetia  7)Hypothyroidism.  Continue on Synthroid  8)Non-anion gap metabolic acidosis.  Likely related to GI losses.  Resolved with bicarbonate infusion.  9)Right pelvic cystic mass, likely ovarian.  Incidental finding on CT.  Will need outpatient ovarian ultrasound once acute issues have resolved.---Complex cystic mass in the right hemipelvis likely associated with the right ovary measuring up to 3.9 cm. Follow-up with a nonemergent outpatient pelvic ultrasound is recommended. --- Needs Pelvic ultrasound and colonoscopy  10)Findings are concerning for an infectious or inflammatory colitis.. As an underlying mass cannot be excluded, a follow-up colonoscopy is recommended as an outpatient  11)Ulcer in Perineum --POA--Lt > Rt--- looks like pressure ulcers from thighs rubbing together--- --please see photos in epic, wound care consult requested -Wound care RN recommends :-cleanse wounds on bilateral perineum and right shin with soap and water. Place a small piece of Xeroform gauze Kellie Simmering [phone]) over the wounds, then a small foam dressing.   12) buttock wounds--- POA---Desitin 3 x daily for the scattered, superficial buttocks areas.  Generalized weakness and deconditioning--- PT eval appreciated recommends SNF rehab  Status is: Inpatient  Remains inpatient appropriate because:Awaiting insurance approval for transfer to SNF rehab   Disposition: The patient is from: Home              Anticipated d/c is to: SNF              Anticipated d/c date is: 2 days              Patient currently is medically stable to d/c. Barriers: Awaiting insurance approval for transfer to SNF rehab  Code Status : full  Family Communication:   (patient is alert, awake and coherent)  Discussed with daughter [name]  Consults  :  na  DVT Prophylaxis  :  - SCDs (hgb dropped)  Lab Results  Component Value Date   PLT 400 06/18/2020    Inpatient Medications  Scheduled Meds: . brimonidine  1 drop Both Eyes Q12H   And  . timolol  1 drop Both Eyes Q12H  . citalopram  10 mg Oral Daily  . cycloSPORINE  1 drop Both Eyes BID  . darifenacin  15 mg Oral Daily  . ezetimibe  10 mg Oral Daily  . gabapentin  300 mg Oral BID  . insulin aspart  0-5 Units Subcutaneous QHS  . insulin aspart  0-6 Units Subcutaneous TID WC  . liver oil-zinc oxide   Topical TID  . multivitamin with minerals  1 tablet Oral Daily  . nystatin   Topical TID  . pantoprazole  40 mg Oral Daily  . predniSONE  5 mg Oral Daily  . pyridostigmine  60 mg Oral TID  . rosuvastatin  40 mg Oral q1800  . vancomycin  125 mg Oral QID  . venlafaxine XR  150 mg Oral Daily   Continuous Infusions: . dextrose 5 % 1,000  mL with potassium chloride 20 mEq, sodium bicarbonate 150 mEq infusion 40 mL/hr at 06/18/20 1319   PRN Meds:.acetaminophen **OR** acetaminophen, HYDROcodone-acetaminophen, prochlorperazine, white petrolatum    Anti-infectives (From admission, onward)   Start     Dose/Rate Route Frequency Ordered Stop   06/16/20 1000  vancomycin (VANCOCIN) 50 mg/mL oral solution 125 mg     Discontinue     125 mg Oral 4 times daily 06/16/20 0947 06/26/20 0959   06/12/20 1600  ciprofloxacin (CIPRO) IVPB 400 mg  Status:  Discontinued        400 mg 200 mL/hr over 60 Minutes Intravenous Every 12 hours 06/12/20 0515 06/16/20 0947   06/12/20 1400  metroNIDAZOLE (FLAGYL) IVPB 500 mg  Status:  Discontinued        500 mg 100 mL/hr over 60 Minutes Intravenous Every 8 hours 06/12/20 0535 06/16/20 0947   06/12/20 0530  metroNIDAZOLE (FLAGYL) IVPB 500 mg  Status:  Discontinued        500 mg 100 mL/hr over 60 Minutes Intravenous Every 8 hours 06/12/20 0515 06/12/20 0535   06/12/20 0500   ciprofloxacin (CIPRO) IVPB 400 mg        400 mg 200 mL/hr over 60 Minutes Intravenous  Once 06/12/20 0447 06/12/20 0639   06/12/20 0500  metroNIDAZOLE (FLAGYL) IVPB 500 mg        500 mg 100 mL/hr over 60 Minutes Intravenous  Once 06/12/20 0447 06/12/20 0639        Objective:   Vitals:   06/18/20 0509 06/18/20 1453 06/18/20 2029 06/19/20 0552  BP: (!) 118/53 (!) 127/58 120/61 (!) 113/47  Pulse: 65 67 69 74  Resp: 18 18 16 18   Temp: 97.6 F (36.4 C) 98 F (36.7 C) 98.5 F (36.9 C) 99.8 F (37.7 C)  TempSrc: Oral Oral Oral Oral  SpO2: 98% 100% 99% 91%  Weight:      Height:        Wt Readings from Last 3 Encounters:  06/12/20 104.1 kg  05/20/20 106.6 kg  04/29/20 106.7 kg     Intake/Output Summary (Last 24 hours) at 06/19/2020 1321 Last data filed at 06/18/2020 1700 Gross per 24 hour  Intake 360 ml  Output 500 ml  Net -140 ml     Physical Exam  Gen:- Awake Alert, in no acute distress HEENT:- [place].AT, No sclera icterus Neck-Supple Neck,No JVD,.  Lungs-  CTAB , fair symmetrical air movement CV- S1, S2 normal, regular  Abd-  +ve B.Sounds, Abd Soft, abdominal discomfort on palpation without significant tenderness per se    Extremity/Skin:- No  edema, pedal pulses present  Psych-affect is appropriate, oriented x3 Neuro-generalized weakness, no new focal deficits, no tremors GU- Ulcer in Perineum--Lt > Rt--- looks like pressure ulcers from thighs rubbing together--- please see photos in epic   Data Review:   Micro Results Recent Results (from the past 240 hour(s))  Gastrointestinal Panel by PCR , Stool     Status: None   Collection Time: 06/12/20 12:50 AM   Specimen: Rectum; Stool  Result Value Ref Range Status   Campylobacter species NOT DETECTED NOT DETECTED Final   Plesimonas shigelloides NOT DETECTED NOT DETECTED Final   Salmonella species NOT DETECTED NOT DETECTED Final   Yersinia enterocolitica NOT DETECTED NOT DETECTED Final   Vibrio species NOT DETECTED  NOT DETECTED Final   Vibrio cholerae NOT DETECTED NOT DETECTED Final   Enteroaggregative E coli (EAEC) NOT DETECTED NOT DETECTED Final   Enteropathogenic E coli (EPEC)  NOT DETECTED NOT DETECTED Final   Enterotoxigenic E coli (ETEC) NOT DETECTED NOT DETECTED Final   Shiga like toxin producing E coli (STEC) NOT DETECTED NOT DETECTED Final   Shigella/Enteroinvasive E coli (EIEC) NOT DETECTED NOT DETECTED Final   Cryptosporidium NOT DETECTED NOT DETECTED Final   Cyclospora cayetanensis NOT DETECTED NOT DETECTED Final   Entamoeba histolytica NOT DETECTED NOT DETECTED Final   Giardia lamblia NOT DETECTED NOT DETECTED Final   Adenovirus F40/41 NOT DETECTED NOT DETECTED Final   Astrovirus NOT DETECTED NOT DETECTED Final   Norovirus GI/GII NOT DETECTED NOT DETECTED Final   Rotavirus A NOT DETECTED NOT DETECTED Final   Sapovirus (I, II, IV, and V) NOT DETECTED NOT DETECTED Final    Comment: Performed at [facility], [address] [place], [place] [address]  SARS Coronavirus 2 by RT PCR (hospital order, performed in [place] hospital lab) Nasopharyngeal Nasopharyngeal Swab     Status: None   Collection Time: 06/12/20  4:48 AM   Specimen: Nasopharyngeal Swab  Result Value Ref Range Status   SARS Coronavirus 2 NEGATIVE NEGATIVE Final    Comment: (NOTE) SARS-CoV-2 target nucleic acids are NOT DETECTED.  The SARS-CoV-2 RNA is generally detectable in upper and lower respiratory specimens during the acute phase of infection. The lowest concentration of SARS-CoV-2 viral copies this assay can detect is 250 copies / mL. A negative result does not preclude SARS-CoV-2 infection and should not be used as the sole basis for treatment or other patient management decisions.  A negative result may occur with improper specimen collection / handling, submission of specimen other than nasopharyngeal swab, presence of viral mutation(s) within the areas targeted by this assay, and inadequate number  of viral copies (<250 copies / mL). A negative result must be combined with clinical observations, patient history, and epidemiological information.  Fact Sheet for Patients:   StrictlyIdeas.no  Fact Sheet for Healthcare Providers: BankingDealers.co.za  This test is not yet approved or  cleared by the [place] FDA and has been authorized for detection and/or diagnosis of SARS-CoV-2 by FDA under an Emergency Use Authorization (EUA).  This EUA will remain in effect (meaning this test can be used) for the duration of the COVID-19 declaration under Section 564(b)(1) of the Act, 21 U.S.C. section 360bbb-3(b)(1), unless the authorization is terminated or revoked sooner.  Performed at [facility], [address], [place], [place] [address]   C Difficile Quick Screen w PCR reflex     Status: Abnormal   Collection Time: 06/15/20  6:08 PM   Specimen: STOOL  Result Value Ref Range Status   C Diff antigen POSITIVE (A) NEGATIVE Final   C Diff toxin NEGATIVE NEGATIVE Final   C Diff interpretation Results are indeterminate. See PCR results.  Final    Comment: Performed at [facility], [address], [place], [place] [address]  C. Diff by PCR, Reflexed     Status: Abnormal   Collection Time: 06/15/20  6:08 PM  Result Value Ref Range Status   Toxigenic C. Difficile by PCR POSITIVE (A) NEGATIVE Final    Comment: Positive for toxigenic C. difficile with little to no toxin production. Only treat if clinical presentation suggests symptomatic illness. Performed at [facility], [place] [address] [place], [place] [address]     Radiology Reports CT ABDOMEN PELVIS WO CONTRAST  Result Date: 06/12/2020 CLINICAL DATA:  Left lower quadrant abdominal pain. EXAM: CT ABDOMEN AND PELVIS WITHOUT CONTRAST TECHNIQUE: Multidetector CT imaging  of the abdomen and pelvis was performed following the standard protocol without IV contrast. COMPARISON:  None.  FINDINGS: Lower chest: The lung bases are clear. The heart size is normal. Hepatobiliary: The liver is normal. Status post cholecystectomy.There is no biliary ductal dilation. Pancreas: Normal contours without ductal dilatation. No peripancreatic fluid collection. Spleen: Unremarkable. Adrenals/Urinary Tract: --Adrenal glands: Unremarkable. --Right kidney/ureter: There is a nonobstructing stone in the upper pole the right kidney. --Left kidney/ureter: No hydronephrosis or radiopaque kidney stones. --Urinary bladder: Unremarkable. Stomach/Bowel: --Stomach/Duodenum: There is a large hiatal hernia. --Small bowel: Unremarkable. --Colon: There is diffuse circumferential wall thickening of much of the descending colon and sigmoid colon. 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 There are enlarged adjacent regional lymph nodes. There is a moderate amount of stool in the remaining portions of the colon. There may be a single inflamed diverticulum in the ascending colon (axial series 2, image 37). --Appendix: Not visualized. No right lower quadrant inflammation or free fluid. Vascular/Lymphatic: Atherosclerotic calcification is present within the non-aneurysmal abdominal aorta, without hemodynamically significant stenosis. --No retroperitoneal lymphadenopathy. --there are mildly enlarged mesenteric lymph nodes. There are multiple enlarged regional lymph nodes along the course of the descending colon. --No pelvic or inguinal lymphadenopathy. Reproductive: There is a complex cystic mass in the right hemipelvis likely associated with the right ovary measuring 3.9 x 2.6 cm (axial series 2, image 69). There is a small cystic structure involving the left ovary measuring approximately 1.8 cm. The patient is status post prior hysterectomy. Other: No ascites or free air. The abdominal wall is normal. Musculoskeletal. No acute displaced fractures. IMPRESSION: 1. Diffuse circumferential wall thickening of much of the descending colon and  sigmoid colon with multiple enlarged adjacent regional lymph nodes. Findings are concerning for an infectious or inflammatory colitis. Correlation with patient's symptoms and laboratory studies is recommended. As an underlying mass cannot be excluded, a follow-up colonoscopy is recommended as an outpatient. 2. Complex cystic mass in the right hemipelvis likely associated with the right ovary measuring up to 3.9 cm. Follow-up with a nonemergent outpatient pelvic ultrasound is recommended. 3. Large hiatal hernia. 4. Nonobstructive right nephrolithiasis. Aortic Atherosclerosis (ICD10-I70.0). Electronically Signed   By: Constance Holster M.D.   On: 06/12/2020 04:00     CBC Recent Labs  Lab 06/13/20 0537 06/14/20 0636 06/15/20 0801 06/17/20 0641 06/18/20 1000  WBC 15.3* 13.9* 15.9* 14.8* 16.4*  HGB 7.9* 8.0* 8.0* 8.4* 8.3*  HCT 24.2* 25.1* 24.8* 25.7* 25.5*  PLT 453* 415* 409* 355 400  MCV 82.6 84.2 82.4 83.2 82.5  MCH 27.0 26.8 26.6 27.2 26.9  MCHC 32.6 31.9 32.3 32.7 32.5  RDW 17.0* 17.3* 17.4* 17.8* 18.0*  LYMPHSABS 1.5 1.4 1.4  --   --   MONOABS 1.6* 1.6* 1.7*  --   --   EOSABS 0.5 0.4 0.4  --   --   BASOSABS 0.0 0.1 0.1  --   --     Chemistries  Recent Labs  Lab 06/13/20 0537 06/13/20 0537 06/14/20 0636 06/15/20 0801 06/16/20 0515 06/17/20 0641 06/18/20 1000  NA 134*   < > 135 136 136 135 134*  K 3.3*   < > 3.2* 4.6 4.7 4.5 4.3  CL 101   < > 98 99 100 97* 95*  CO2 25   < > 29 29 28 30  32  GLUCOSE 101*   < > 99 80 69* 82 119*  BUN 24*   < > 22 20 18 16 16   CREATININE 1.18*   < >  1.21* 1.12* 1.22* 1.22* 1.27*  CALCIUM 7.4*   < > 7.3* 7.6* 7.8* 7.8* 7.6*  MG  --   --   --   --   --  1.6* 1.8  AST 16  --   --   --   --   --   --   ALT 12  --   --   --   --   --   --   ALKPHOS 107  --   --   --   --   --   --   BILITOT 0.6  --   --   --   --   --   --    < > = values in this interval not displayed.    ------------------------------------------------------------------------------------------------------------------ No results for input(s): CHOL, HDL, LDLCALC, TRIG, CHOLHDL, LDLDIRECT in the last 72 hours.  Lab Results  Component Value Date   HGBA1C 6.9 (H) 06/12/2020   ------------------------------------------------------------------------------------------------------------------ No results for input(s): TSH, T4TOTAL, T3FREE, THYROIDAB in the last 72 hours.  Invalid input(s): FREET3 ------------------------------------------------------------------------------------------------------------------ No results for input(s): VITAMINB12, FOLATE, FERRITIN, TIBC, IRON, RETICCTPCT in the last 72 hours.  Coagulation profile No results for input(s): INR, PROTIME in the last 168 hours.  No results for input(s): DDIMER in the last 72 hours.  Cardiac Enzymes No results for input(s): CKMB, TROPONINI, MYOGLOBIN in the last 168 hours.  Invalid input(s): CK ------------------------------------------------------------------------------------------------------------------    Component Value Date/Time   BNP 177.8 (H) 11/29/2016 1657    Roxan Hockey M.D on 06/19/2020 at 1:21 PM  Go to [email or web address] - for contact info  [facility] - Office  [phone]

## 2020-06-20 ENCOUNTER — Telehealth: Payer: Medicare Other

## 2020-06-20 ENCOUNTER — Inpatient Hospital Stay
Admission: RE | Admit: 2020-06-20 | Discharge: 2020-07-03 | Disposition: A | Discharge status: Other Acute Inpatient Hospital | Payer: Medicare Other | Source: Ambulatory Visit | Attending: Internal Medicine | Admitting: Internal Medicine

## 2020-06-20 DIAGNOSIS — I129 Hypertensive chronic kidney disease with stage 1 through stage 4 chronic kidney disease, or unspecified chronic kidney disease: Secondary | ICD-10-CM | POA: Diagnosis not present

## 2020-06-20 DIAGNOSIS — E1122 Type 2 diabetes mellitus with diabetic chronic kidney disease: Secondary | ICD-10-CM | POA: Diagnosis not present

## 2020-06-20 DIAGNOSIS — G4733 Obstructive sleep apnea (adult) (pediatric): Secondary | ICD-10-CM | POA: Diagnosis not present

## 2020-06-20 DIAGNOSIS — E1142 Type 2 diabetes mellitus with diabetic polyneuropathy: Secondary | ICD-10-CM | POA: Diagnosis not present

## 2020-06-20 DIAGNOSIS — E876 Hypokalemia: Secondary | ICD-10-CM | POA: Diagnosis not present

## 2020-06-20 DIAGNOSIS — E1140 Type 2 diabetes mellitus with diabetic neuropathy, unspecified: Secondary | ICD-10-CM | POA: Diagnosis not present

## 2020-06-20 DIAGNOSIS — M6281 Muscle weakness (generalized): Secondary | ICD-10-CM | POA: Diagnosis not present

## 2020-06-20 DIAGNOSIS — K5731 Diverticulosis of large intestine without perforation or abscess with bleeding: Secondary | ICD-10-CM | POA: Diagnosis not present

## 2020-06-20 DIAGNOSIS — Z20822 Contact with and (suspected) exposure to covid-19: Secondary | ICD-10-CM | POA: Diagnosis present

## 2020-06-20 DIAGNOSIS — A0471 Enterocolitis due to Clostridium difficile, recurrent: Secondary | ICD-10-CM | POA: Diagnosis present

## 2020-06-20 DIAGNOSIS — R0602 Shortness of breath: Secondary | ICD-10-CM | POA: Diagnosis not present

## 2020-06-20 DIAGNOSIS — K222 Esophageal obstruction: Secondary | ICD-10-CM | POA: Diagnosis not present

## 2020-06-20 DIAGNOSIS — H409 Unspecified glaucoma: Secondary | ICD-10-CM | POA: Diagnosis not present

## 2020-06-20 DIAGNOSIS — R933 Abnormal findings on diagnostic imaging of other parts of digestive tract: Secondary | ICD-10-CM | POA: Diagnosis not present

## 2020-06-20 DIAGNOSIS — D649 Anemia, unspecified: Secondary | ICD-10-CM | POA: Diagnosis not present

## 2020-06-20 DIAGNOSIS — E1129 Type 2 diabetes mellitus with other diabetic kidney complication: Secondary | ICD-10-CM | POA: Diagnosis not present

## 2020-06-20 DIAGNOSIS — K219 Gastro-esophageal reflux disease without esophagitis: Secondary | ICD-10-CM | POA: Diagnosis not present

## 2020-06-20 DIAGNOSIS — Z741 Need for assistance with personal care: Secondary | ICD-10-CM | POA: Diagnosis not present

## 2020-06-20 DIAGNOSIS — R1909 Other intra-abdominal and pelvic swelling, mass and lump: Secondary | ICD-10-CM | POA: Diagnosis not present

## 2020-06-20 DIAGNOSIS — Z833 Family history of diabetes mellitus: Secondary | ICD-10-CM | POA: Diagnosis not present

## 2020-06-20 DIAGNOSIS — R262 Difficulty in walking, not elsewhere classified: Secondary | ICD-10-CM | POA: Diagnosis not present

## 2020-06-20 DIAGNOSIS — E785 Hyperlipidemia, unspecified: Secondary | ICD-10-CM | POA: Diagnosis not present

## 2020-06-20 DIAGNOSIS — Z794 Long term (current) use of insulin: Secondary | ICD-10-CM | POA: Diagnosis not present

## 2020-06-20 DIAGNOSIS — J449 Chronic obstructive pulmonary disease, unspecified: Secondary | ICD-10-CM | POA: Diagnosis not present

## 2020-06-20 DIAGNOSIS — J3089 Other allergic rhinitis: Secondary | ICD-10-CM | POA: Diagnosis not present

## 2020-06-20 DIAGNOSIS — K5521 Angiodysplasia of colon with hemorrhage: Secondary | ICD-10-CM | POA: Diagnosis not present

## 2020-06-20 DIAGNOSIS — E039 Hypothyroidism, unspecified: Secondary | ICD-10-CM | POA: Diagnosis not present

## 2020-06-20 DIAGNOSIS — Z87891 Personal history of nicotine dependence: Secondary | ICD-10-CM | POA: Diagnosis not present

## 2020-06-20 DIAGNOSIS — R1900 Intra-abdominal and pelvic swelling, mass and lump, unspecified site: Secondary | ICD-10-CM | POA: Diagnosis not present

## 2020-06-20 DIAGNOSIS — K625 Hemorrhage of anus and rectum: Secondary | ICD-10-CM | POA: Diagnosis not present

## 2020-06-20 DIAGNOSIS — N183 Chronic kidney disease, stage 3 unspecified: Secondary | ICD-10-CM | POA: Diagnosis not present

## 2020-06-20 DIAGNOSIS — Z96651 Presence of right artificial knee joint: Secondary | ICD-10-CM | POA: Diagnosis not present

## 2020-06-20 DIAGNOSIS — Z9181 History of falling: Secondary | ICD-10-CM | POA: Diagnosis not present

## 2020-06-20 DIAGNOSIS — N179 Acute kidney failure, unspecified: Secondary | ICD-10-CM | POA: Diagnosis not present

## 2020-06-20 DIAGNOSIS — Z841 Family history of disorders of kidney and ureter: Secondary | ICD-10-CM | POA: Diagnosis not present

## 2020-06-20 DIAGNOSIS — K5790 Diverticulosis of intestine, part unspecified, without perforation or abscess without bleeding: Secondary | ICD-10-CM | POA: Diagnosis not present

## 2020-06-20 DIAGNOSIS — K2100 Gastro-esophageal reflux disease with esophagitis, without bleeding: Secondary | ICD-10-CM | POA: Diagnosis not present

## 2020-06-20 DIAGNOSIS — R110 Nausea: Secondary | ICD-10-CM | POA: Diagnosis not present

## 2020-06-20 DIAGNOSIS — D509 Iron deficiency anemia, unspecified: Secondary | ICD-10-CM | POA: Diagnosis not present

## 2020-06-20 DIAGNOSIS — K922 Gastrointestinal hemorrhage, unspecified: Secondary | ICD-10-CM | POA: Diagnosis not present

## 2020-06-20 DIAGNOSIS — D500 Iron deficiency anemia secondary to blood loss (chronic): Secondary | ICD-10-CM | POA: Diagnosis not present

## 2020-06-20 DIAGNOSIS — G7000 Myasthenia gravis without (acute) exacerbation: Secondary | ICD-10-CM | POA: Diagnosis not present

## 2020-06-20 DIAGNOSIS — Z8249 Family history of ischemic heart disease and other diseases of the circulatory system: Secondary | ICD-10-CM | POA: Diagnosis not present

## 2020-06-20 DIAGNOSIS — D62 Acute posthemorrhagic anemia: Secondary | ICD-10-CM | POA: Diagnosis not present

## 2020-06-20 DIAGNOSIS — N83201 Unspecified ovarian cyst, right side: Secondary | ICD-10-CM | POA: Diagnosis present

## 2020-06-20 DIAGNOSIS — A0472 Enterocolitis due to Clostridium difficile, not specified as recurrent: Secondary | ICD-10-CM | POA: Diagnosis not present

## 2020-06-20 DIAGNOSIS — Z809 Family history of malignant neoplasm, unspecified: Secondary | ICD-10-CM | POA: Diagnosis not present

## 2020-06-20 LAB — CBC
HCT: 22.9 % — ABNORMAL LOW (ref 36.0–46.0)
Hemoglobin: 7.5 g/dL — ABNORMAL LOW (ref 12.0–15.0)
MCH: 26.6 pg (ref 26.0–34.0)
MCHC: 32.8 g/dL (ref 30.0–36.0)
MCV: 81.2 fL (ref 80.0–100.0)
Platelets: 410 10*3/uL — ABNORMAL HIGH (ref 150–400)
RBC: 2.82 MIL/uL — ABNORMAL LOW (ref 3.87–5.11)
RDW: 17.8 % — ABNORMAL HIGH (ref 11.5–15.5)
WBC: 14.5 10*3/uL — ABNORMAL HIGH (ref 4.0–10.5)
nRBC: 0 % (ref 0.0–0.2)

## 2020-06-20 LAB — COMPREHENSIVE METABOLIC PANEL WITH GFR
ALT: 17 U/L (ref 0–44)
AST: 31 U/L (ref 15–41)
Albumin: 1.5 g/dL — ABNORMAL LOW (ref 3.5–5.0)
Alkaline Phosphatase: 104 U/L (ref 38–126)
Anion gap: 9 (ref 5–15)
BUN: 16 mg/dL (ref 8–23)
CO2: 32 mmol/L (ref 22–32)
Calcium: 7.7 mg/dL — ABNORMAL LOW (ref 8.9–10.3)
Chloride: 93 mmol/L — ABNORMAL LOW (ref 98–111)
Creatinine, Ser: 1.18 mg/dL — ABNORMAL HIGH (ref 0.44–1.00)
GFR calc Af Amer: 52 mL/min — ABNORMAL LOW
GFR calc non Af Amer: 44 mL/min — ABNORMAL LOW
Glucose, Bld: 80 mg/dL (ref 70–99)
Potassium: 3.9 mmol/L (ref 3.5–5.1)
Sodium: 134 mmol/L — ABNORMAL LOW (ref 135–145)
Total Bilirubin: 0.5 mg/dL (ref 0.3–1.2)
Total Protein: 4.9 g/dL — ABNORMAL LOW (ref 6.5–8.1)

## 2020-06-20 LAB — MAGNESIUM: Magnesium: 1.8 mg/dL (ref 1.7–2.4)

## 2020-06-20 LAB — GLUCOSE, CAPILLARY
Glucose-Capillary: 75 mg/dL (ref 70–99)
Glucose-Capillary: 85 mg/dL (ref 70–99)
Glucose-Capillary: 128 mg/dL — ABNORMAL HIGH (ref 70–99)

## 2020-06-20 MED ORDER — LINAGLIPTIN 5 MG PO TABS: 5.0000 mg | ORAL_TABLET | Freq: Every day | ORAL | 0 refills | Status: DC

## 2020-06-20 MED ORDER — VANCOMYCIN HCL 125 MG PO CAPS: 125.0000 mg | ORAL_CAPSULE | Freq: Four times a day (QID) | ORAL | 0 refills | Status: AC

## 2020-06-20 MED ORDER — NYSTATIN 100000 UNIT/GM EX POWD: Freq: Three times a day (TID) | CUTANEOUS | 0 refills | Status: DC

## 2020-06-20 MED ORDER — LOSARTAN POTASSIUM 25 MG PO TABS: 25.0000 mg | ORAL_TABLET | Freq: Every day | ORAL | 3 refills | Status: DC

## 2020-06-20 MED ORDER — ZINC OXIDE 40 % EX OINT: TOPICAL_OINTMENT | Freq: Three times a day (TID) | CUTANEOUS | 0 refills | Status: DC

## 2020-06-20 MED ORDER — HYDROCODONE-ACETAMINOPHEN 5-325 MG PO TABS: 1.0000 | ORAL_TABLET | Freq: Four times a day (QID) | ORAL | 0 refills | Status: DC | PRN

## 2020-06-20 MED ORDER — INSULIN LISPRO (1 UNIT DIAL) 100 UNIT/ML (KWIKPEN): 0.0000 [IU] | PEN_INJECTOR | Freq: Three times a day (TID) | SUBCUTANEOUS | 11 refills | Status: DC

## 2020-06-20 MED ORDER — ROSUVASTATIN CALCIUM 40 MG PO TABS: ORAL_TABLET | ORAL | 5 refills | Status: DC

## 2020-06-20 MED ORDER — DILTIAZEM HCL ER COATED BEADS 120 MG PO CP24
120.0000 mg | ORAL_CAPSULE | Freq: Every day | ORAL | 5 refills | Status: DC
Start: 2020-06-20 — End: 2020-08-08

## 2020-06-20 MED ORDER — LANTUS SOLOSTAR 100 UNIT/ML ~~LOC~~ SOPN: 6.0000 [IU] | PEN_INJECTOR | Freq: Every day | SUBCUTANEOUS | 5 refills | Status: DC

## 2020-06-20 MED ORDER — ACETAMINOPHEN 325 MG PO TABS: 650.0000 mg | ORAL_TABLET | Freq: Four times a day (QID) | ORAL | 1 refills | Status: AC | PRN

## 2020-06-20 NOTE — Discharge Instructions (Signed)
1)--- you are found to have a pelvic mass you will need Pelvic ultrasound to be done as outpatient within the next 2 to 4 weeks   2) you were found to have colitis --you will need colonoscopy as outpatient in next 6 to 8 weeks to exclude possible mass  3)Ulcer in Perineum --POA--Lt > Rt--- looks like pressure ulcers from thighs rubbing together--- --please see photos in epic, wound care consult requested -Wound care RN recommends :-cleanse wounds on bilateral perineum and right shin with soap and water. Place a small piece of Xeroform gauze Kellie Simmering [phone]) over the wounds, then a small foam dressing. 4)buttock wounds--- POA--- wound care specialist recommends Desitin 3 x daily for the scattered, superficial buttocks areas. 5) okay to use nystatin powder to groin and inguinal fold areas 6) oral vancomycin as prescribed 4 times a day for the next week for C. difficile colitis/colon infection 7) you will need repeat CBC and BMP blood test every Thursday for the next couple of weeks starting on 06/23/2020 8) Lantus insulin has been decreased to 6 units at bedtime only--- t this can be increased later when oral intake improves 9)insulin aspart (novoLOG) injection 0-10 Units  0-10 Units Subcutaneous, 3 times daily with meals  CBG < 70: Implement Hypoglycemia Standing Orders and refer to Hypoglycemia Standing Orders sidebar report   CBG 70 - 120: 0 unit CBG 121 - 150: 0 unit  CBG 151 - 200: 1 unit  CBG 201 - 250: 2 units  CBG 251 - 300: 4 units  CBG 301 - 350: 6 units   CBG 351 - 400: 8 units  CBG > 400: 10 units 10)Avoid ibuprofen/Advil/Aleve/Motrin/Goody Powders/Naproxen/BC powders/Meloxicam/Diclofenac/Indomethacin and other Nonsteroidal anti-inflammatory medications as these will make you more likely to bleed and can cause stomach ulcers, can also cause Kidney problems.

## 2020-06-20 NOTE — Discharge Summary (Addendum)
[name], is a 79 y.o. female  DOB April 02, 1941  MRN [id number].  Admission date:  06/11/2020  Admitting Physician  Kathie Dike, MD  Discharge Date:  06/20/2020   Primary MD  Fayrene Helper, MD  Recommendations for primary care physician for things to follow:   1)--- you are found to have a pelvic mass you will need Pelvic ultrasound to be done as outpatient within the next 2 to 4 weeks   2) you were found to have colitis --you will need colonoscopy as outpatient in next 6 to 8 weeks to exclude possible mass  3)Ulcer in Perineum --POA--Lt > Rt--- looks like pressure ulcers from thighs rubbing together--- --please see photos in epic, wound care consult requested -Wound care RN recommends :-cleanse wounds on bilateral perineum and right shin with soap and water. Place a small piece of Xeroform gauze Kellie Simmering [phone]) over the wounds, then a small foam dressing. 4)buttock wounds--- POA--- wound care specialist recommends Desitin 3 x daily for the scattered, superficial buttocks areas. 5) okay to use nystatin powder to groin and inguinal fold areas 6) oral vancomycin as prescribed 4 times a day for the next week for C. difficile colitis/colon infection 7) you will need repeat CBC and BMP blood test every Thursday for the next couple of weeks starting on 06/23/2020 8) Lantus insulin has been decreased to 6 units at bedtime only--- t this can be increased later when oral intake improves 9)insulin aspart (novoLOG) injection 0-10 Units  0-10 Units Subcutaneous, 3 times daily with meals  CBG < 70: Implement Hypoglycemia Standing Orders and refer to Hypoglycemia Standing Orders sidebar report   CBG 70 - 120: 0 unit CBG 121 - 150: 0 unit  CBG 151 - 200: 1 unit  CBG 201 - 250: 2 units  CBG 251 - 300: 4 units  CBG 301 - 350: 6 units   CBG 351 - 400: 8 units  CBG > 400: 10 units 10)Avoid  ibuprofen/Advil/Aleve/Motrin/Goody Powders/Naproxen/BC powders/Meloxicam/Diclofenac/Indomethacin and other Nonsteroidal anti-inflammatory medications as these will make you more likely to bleed and can cause stomach ulcers, can also cause Kidney problems.   Admission Diagnosis  Hypokalemia [E87.6] Colitis [K52.9] Acute colitis [K52.9] AKI (acute kidney injury) ([place]) [N17.9]   Discharge Diagnosis  Hypokalemia [E87.6] Colitis [K52.9] Acute colitis [K52.9] AKI (acute kidney injury) ([place]) [N17.9]    Principal Problem:   C. difficile colitis Active Problems:   AKI (acute kidney injury) ([place])   CKD stage 3 due to type 2 diabetes mellitus (HCC)   Hypothyroidism   Type 2 diabetes mellitus (HCC)   Hyperlipemia   IDA (iron deficiency anemia)   Depression with anxiety   Unspecified glaucoma   GERD   Obstructive sleep apnea   Myasthenia gravis in remission ([place])   Hypokalemia   Hypomagnesemia   Colitis      Past Medical History:  Diagnosis Date  . Anemia in chronic renal disease 12/30/2015  . Anxiety   . Arthritis    "all over" (01/19/2014)  .  Arthritis, lumbar spine   . Carpal tunnel syndrome   . Chronic bronchitis ([place])    "got it q yr for awhile; hasn't had it in awhile" (01/19/2014)  . Chronic kidney disease (CKD) stage G3b/A1, moderately decreased glomerular filtration rate (GFR) between 30-44 mL/min/1.73 square meter and albuminuria creatinine ratio less than 30 mg/g 09/14/2009   Qualifier: Diagnosis of  By: Moshe Cipro MD, Joycelyn Schmid    . Chronic renal disease, stage 3, moderately decreased glomerular filtration rate (GFR) between 30-59 mL/min/1.73 square meter 09/14/2009   Qualifier: Diagnosis of  By: Moshe Cipro MD, Joycelyn Schmid    . Complication of anesthesia    combative  . COPD (chronic obstructive pulmonary disease) ([place])   . Depression   . Diverticulosis 07/2004   Colonscopy Dr Gala Romney  . Esophagitis, erosive 2009  . Exertional shortness of breath   . Gastroesophageal reflux   .  UVOZDGUY(403.4)    "usually a couple times/wk" (01/19/2014)  . Heart murmur    saw cardiology In [place], he told her she did not need to come back.  . Hyperlipidemia   . Hypertension   . Impingement syndrome, shoulder   . Iron deficiency anemia   . Mitral regurgitation   . Myasthenia gravis   . Myasthenia gravis in remission ([place]) 11/24/2014  . Myasthenia gravis in remission ([place])   . Obesity   . OSA on CPAP    Negative on last sleep study  . Pneumonia 01/2012  . Pulmonary HTN ([place])   . Schatzki's ring    Last EGD w/ dilation 02/08/11, 2009 & 2007  . Seasonal allergies   . Shoulder pain   . Thyroid disease    "used to take RX; they took me off it" (01/19/2014)  . Tobacco abuse   . Type II diabetes mellitus ([place])     Past Surgical History:  Procedure Laterality Date  . [place] VITRECTOMY WITH 20 GAUGE MVR PORT Left 01/19/2014   Procedure: 25 GAUGE PARS PLANA VITRECTOMY WITH 20 GAUGE MVR PORT; MEMBRAME PEEL; SERUM PATCH; LASER TREATMENT; C3F8;  Surgeon: Hayden Pedro, MD;  Location: [facility];  Service: Ophthalmology;  Laterality: Left;  . ABDOMINAL HYSTERECTOMY    . AGILE CAPSULE N/A 08/05/2019   Procedure: AGILE CAPSULE;  Surgeon: Daneil Dolin, MD;  [facility];  Service: Endoscopy;  Laterality: N/A;  7:30am  . APPENDECTOMY    . CARPAL TUNNEL RELEASE Bilateral   . CATARACT EXTRACTION W/PHACO Left 10/21/2013   Procedure: LEFT CATARACT EXTRACTION PHACO AND INTRAOCULAR LENS PLACEMENT (IOC);  Surgeon: Marylynn Pearson, MD;  Location: [facility];  Service: Ophthalmology;  Laterality: Left;  . CHOLECYSTECTOMY    . COLONOSCOPY  05/08/2012   VQQ:VZDGLOVF and external hemorrhoids; colonic diverticulosis  . COLONOSCOPY WITH PROPOFOL N/A 02/26/2019   two simple adenomas, diverticulosis,  and internal hemorrhoids.   . ESOPHAGEAL DILATION     "more than 3 times" (01/19/2014)  . ESOPHAGOGASTRODUODENOSCOPY  02/08/11   [place]-Distal esophageal erosion consistent with mild erosive  reflux   esophagitis/ Noncritical Schatzki ring, small hiatal hernia otherwise upper/ gastrointestinal tract appeared unremarkable, status post passage  of a Maloney dilation to biopsy disruption of the ring described  . ESOPHAGOGASTRODUODENOSCOPY  04/2012   2 tandem incomplete distal esophagea rings s/p dilation.   . ESOPHAGOGASTRODUODENOSCOPY N/A 09/16/2014   Dr. Gala Romney: Schatzki ring status post dilation/disruption.  Hiatal hernia.  Marland Kitchen ESOPHAGOGASTRODUODENOSCOPY (EGD) WITH PROPOFOL N/A 12/08/2018   EGD with mild Schatzki ring s/p dilation, small hiatal hernia,  otherwise normal  . EYE SURGERY     cataracts, bilateral  . GIVENS CAPSULE STUDY N/A 09/29/2019   Procedure: GIVENS CAPSULE STUDY;  Surgeon: Daneil Dolin, MD;  [facility];  Service: Endoscopy;  Laterality: N/A;  7:30am  . INCONTINENCE SURGERY  08/26/09   [place]  . JOINT REPLACEMENT     right total knee  . MALONEY DILATION N/A 09/16/2014   Procedure: Venia Minks DILATION;  Surgeon: Daneil Dolin, MD;  [facility];  Service: Endoscopy;  Laterality: N/A;  . Venia Minks DILATION N/A 12/08/2018   Procedure: Venia Minks DILATION;  Surgeon: Daneil Dolin, MD;  [facility];  Service: Endoscopy;  Laterality: N/A;  . PARS PLANA VITRECTOMY W/ REPAIR OF MACULAR HOLE Left 01/19/2014  . POLYPECTOMY  02/26/2019   Procedure: POLYPECTOMY;  Surgeon: Danie Binder, MD;  [facility];  Service: Endoscopy;;  ascending colon polyps x2  . SAVORY DILATION N/A 09/16/2014   Procedure: SAVORY DILATION;  Surgeon: Daneil Dolin, MD;  [facility];  Service: Endoscopy;  Laterality: N/A;  . TONSILLECTOMY    . TOTAL KNEE ARTHROPLASTY Right 05/13/07   Dr. Aline Brochure  . TRIGGER FINGER RELEASE Right 06/26/2018   Procedure: RIGHT INDEX FINGER TRIGGER FINGER/A-1 PULLEY RELEASE;  Surgeon: Carole Civil, MD;  [facility];  Service: Orthopedics;  Laterality: Right;     HPI  from the history and physical done on  the day of admission:    Chief Complaint: Diarrhea for 4 weeks.  HPI: [name] is a 79 y.o. female with medical history significant of normocytic anemia, anxiety, depression, osteoarthritis of the lumbar spine, carpal tunnel syndrome, chronic bronchitis, stage III CKD, COPD, diverticulosis, erosive esophagitis, GERD, unspecified headaches, hyperlipidemia, hypertension, mitral regurgitation, myasthenia gravis in remission, obesity OSA on CPAP, history of pneumonia, pulmonary hypertension, Schatzki's ring, seasonal allergies, shoulder pain, thyroid disease, tobacco abuse, type 2 diabetes mellitus who is coming to the emergency department with complaints of diarrhea for the past 4 weeks associated with weakness and 3 or 4 episodes of emesis since her symptoms begun.  She threw up once last night.  She has been having hematochezia on locations.  She has frequent constipation.  She denies dysuria, frequency or hematuria.  She denies fever, but complains of chills and fatigue.  No rhinorrhea, sore throat, dyspnea, wheezing or hemoptysis.  Denies chest pain, palpitations, diaphoresis, PND, orthopnea or recent pitting edema of the lower extremities.  She denies polyuria, polydipsia, polyphagia or blurred vision.  She denies travel history or sick contacts.  ED Course: Initial vital signs were temperature 97.9 F, pulse 77, respiration 16, blood pressure 133/41 mmHg and O2 sat 98% on room air.  The patient received a 500 mL bolus, ciprofloxacin 400 and metronidazole 500 mg IVPB.  CBC shows a white count of 17.7 with 83% neutrophils, 5% lymphocytes and 8% monocytes.  Hemoglobin 9.1 g/dL and platelets 560.  Lipase was normal.  CMP showed a potassium of 3.2 mmol/L.  All other CMP electrolytes are within normal range when calcium is corrected to albumin.  Glucose 250, BUN 35 and creatinine 1.68 mg/dL.  In April creatinine was 1.19 mg/dL.  LFTs show a mildly elevated alk phos, normal AST, ALT and total  bilirubin.  Total protein 6.4 and albumin 2.3 g/dL.  This is 1.5 and phosphorus 3.0 mg/dL.  Imaging: CT abdomen/pelvis without contrast showed diffuse circumferential wall thickening of much of the descending colon and sigmoid colon  with multiple enlarged adjacent regional lymph nodes likely due to infectious or inflammatory process.  Colonoscopy as an outpatient recommend.  Is also a complex 60 mass in the right hemipelvis likely associated with a right ovary measuring up to 3.9 cm, for which radiologist recommending follow-up with nonemergent outpatient pelvic US.  Large hiatal hernia and nonobstructive right nephrolithiasis.  Please see images and full regular report for further detail.  Review of Systems: As per HPI otherwise all other systems reviewed and are negative.   Hospital Course:    Brief Summary:- 79 y.o.femalewith medical history significant ofnormocytic anemia, anxiety, depression, osteoarthritis of the lumbar spine, carpal tunnel syndrome, chronic bronchitis, stage III CKD, COPD, diverticulosis, erosive esophagitis, GERD, unspecified headaches, hyperlipidemia, hypertension, mitral regurgitation, myasthenia gravis in remission, obesity OSA on CPAP, history of pneumonia, pulmonary hypertension, Schatzki's ring, seasonal allergies, shoulder pain, thyroid disease, tobacco abuse, type 2 diabetes mellitus with recurrent exposures to antibiotics due to repeated course of treatment for sinus infections and bronchitis admitted on 06/12/2020 with clinical and imaging findings consistent with acute colitis favor infectious etiology -Subsequent C. difficile testing positive -In summary patient's C. difficile colitis infection was present on admission (POA) , however testing for C. difficile colitis was delayed as narrated below in # 1  A/p 1)Acute C. Difficile Colitis--- POA-- abdominal discomfort nausea and diarrhea persist, Volume and frequency of stools has improved  significantly-- -CT abdomen and pelvis on admission with significant colitis -Initially treated with IV Cipro and Flagyl since 06/12/2020, stopped on 06/16/2020 when C. difficile testing came back positive --Patient had significant watery large-volume diarrhea on admission, CT abdomen pelvis was consistent with colitis--- there was high clinical index of suspicion for C. difficile colitis on admission so an order for C. difficile testing and GI pathogen was placed on admission - -However stool sample sent to lab was tested only for GI pathogen but somehow the stool sample was  not tested for C. difficile colitis even though C diff testing was ordered on admission --Given persistent diarrhea and symptoms consistent with suspected C. difficile colitis on 06/16/2020 with the approval of ID physician Dr. Johnnye Sima a  C. difficile stool sample was obtained which came back consistent with suspected C. difficile colitis -P.o. vancomycin initiated on 06/16/2020 based on these results, Cipro and Flagyl discontinued on 06/16/2020 -In summary patient's C. difficile colitis was present on admission (POA) , however testing for C. difficile colitis was delayed as narrated here in # 1  -Patient will need outpatient GI follow-up for colonoscopy once acute issues have resolved. -Advance diet to soft -GI pathogen from 06/12/20 is  negative -WBC 14.5 down from 18.0 on 06/12/20 -Volume and frequency of stools has improved significantly--  2)ABLA---???is down to 7.5 from 11.2 query acute blood loss versus hemodilution -Outpatient colonoscopy planned -Repeat CBC every Thursday starting 06/23/2020  3)Acute kidney injury superimposed on CKD stage IIIa---Likely secondary to volume depletion from GI losses.   Creatinine is down to 1.18 from 1.68 after hydration chronic kidney disease stage III.   -Acute kidney injury appears to have resolved with improvement in diarrhea and with improvement in oral intake -Weekly BMP every  Thursday starting 06/23/2020 advised  4)Myasthenia gravis--- Continue on pyridostigmine 3 times daily. She is also on chronic prednisone  5)Diabetes mellitus type 2--A1c 6.9 reflecting excellent diabetic control PTA -Patient with diarrhea and oral intake is not back to baseline show insulin dosage has been reduced to 6 units nightly, --Continue on sliding scale insulin.  6)Hyperlipidemia. Continue  Crestor and Zetia  7)Hypothyroidism. Continue on Synthroid  8)Non-anion gap metabolic acidosis. Likely related to GI losses.  Resolved with bicarbonate infusion.  9)Right pelvic cystic mass, likely ovarian. Incidental finding on CT. Will need outpatient ovarian ultrasound once acute issues have resolved.---Complex cystic mass in the right hemipelvis likely associated with the right ovary measuring up to 3.9 cm. Follow-up with a nonemergent outpatient pelvic ultrasound is recommended. ---  Pelvic ultrasound and colonoscopy as outpatient strongly advised  10)Findings are concerning for an infectious or inflammatory colitis.. As an underlying mass cannot be excluded, a follow-up colonoscopy is recommended as an outpatient  11)Ulcer in Perineum --POA--Lt > Rt--- looks like pressure ulcers from thighs rubbing together--- --please see photos in epic, wound care consult requested -Wound care RN recommends :-cleanse wounds on bilateral perineum and right shin with soap and water. Place a small piece of Xeroform gauze Kellie Simmering [phone]) over the wounds, then a small foam dressing. 12) buttock wounds--- POA---Desitin 3 x daily for the scattered, superficial buttocks areas.  Generalized weakness and deconditioning--- PT eval appreciated recommends SNF rehab  Disposition: The patient is from: Home  Anticipated d/c is to: SNF  Code Status : full  Family Communication:  (patient is alert, awake and coherent)  Discussed with daughter [name]  Consults  :   Infectious  disease, GI Discharge Condition: stable  Follow UP----follow-up with GYN for pelvic ultrasound evaluation, --- follow-up with gastroenterologist for colonoscopy   Contact information for follow-up providers    Florian Buff, MD. Schedule an appointment as soon as possible for a visit in 2 week(s).   Specialties: Obstetrics and Gynecology, Radiology Why: Complex cystic mass in the right hemipelvis likely associated with the right ovary measuring up to 3.9 cm. Follow-up with a nonemergent outpatient pelvic ultrasound is recommended. Contact information: [place] [address] [phone]        Rogene Houston, MD. Schedule an appointment as soon as possible for a visit in 2 week(s).   Specialty: Gastroenterology Why: Findings are concerning for an infectious or inflammatory colitis.. As an underlying mass cannot be excluded, a follow-up colonoscopy is recommended as an outpatient Contact information: [address], [address] [place] [address] [phone]            Contact information for after-discharge care    [facility] .   Service: Skilled Nursing Contact information: [address] [address] [phone]                  Diet and Activity recommendation:  As advised  Discharge Instructions    Discharge Instructions    Call MD for:  difficulty breathing, headache or visual disturbances   Complete by: As directed    Call MD for:  persistant dizziness or light-headedness   Complete by: As directed    Call MD for:  persistant nausea and vomiting   Complete by: As directed    Call MD for:  severe uncontrolled pain   Complete by: As directed    Call MD for:  temperature >100.4   Complete by: As directed    Diet - low sodium heart healthy   Complete by: As directed    Diet Carb Modified   Complete by: As directed    Discharge instructions   Complete by:  As directed    1)--- you are found to have a pelvic mass you will need  Pelvic ultrasound to be done as outpatient within the next 2 to 4 weeks   2) you were found to have colitis --you will need colonoscopy as outpatient in next 6 to 8 weeks to exclude possible mass  3)Ulcer in Perineum --POA--Lt > Rt--- looks like pressure ulcers from thighs rubbing together--- --please see photos in epic, wound care consult requested -Wound care RN recommends :-cleanse wounds on bilateral perineum and right shin with soap and water. Place a small piece of Xeroform gauze Kellie Simmering [phone]) over the wounds, then a small foam dressing. 4)buttock wounds--- POA--- wound care specialist recommends Desitin 3 x daily for the scattered, superficial buttocks areas. 5) okay to use nystatin powder to groin and inguinal fold areas 6) oral vancomycin as prescribed 4 times a day for the next week for C. difficile colitis/colon infection 7) you will need repeat CBC and BMP blood test every Thursday for the next couple of weeks starting on 06/23/2020 8) Lantus insulin has been decreased to 6 units at bedtime only--- t this can be increased later when oral intake improves 9)insulin aspart (novoLOG) injection 0-10 Units  0-10 Units Subcutaneous, 3 times daily with meals  CBG < 70: Implement Hypoglycemia Standing Orders and refer to Hypoglycemia Standing Orders sidebar report   CBG 70 - 120: 0 unit CBG 121 - 150: 0 unit  CBG 151 - 200: 1 unit  CBG 201 - 250: 2 units  CBG 251 - 300: 4 units  CBG 301 - 350: 6 units   CBG 351 - 400: 8 units  CBG > 400: 10 units 10)Avoid ibuprofen/Advil/Aleve/Motrin/Goody Powders/Naproxen/BC powders/Meloxicam/Diclofenac/Indomethacin and other Nonsteroidal anti-inflammatory medications as these will make you more likely to bleed and can cause stomach ulcers, can also cause Kidney problems.   Discharge wound care:   Complete by: As directed    -Ulcer in Perineum --POA--Lt > Rt--- looks like pressure  ulcers from thighs rubbing together--- --please see photos in epic, wound care consult requested -Wound care RN recommends :-cleanse wounds on bilateral perineum and right shin with soap and water. Place a small piece of Xeroform gauze Kellie Simmering [phone]) over the wounds, then a small foam dressing. -buttock wounds--- POA--- wound care specialist recommends Desitin 3 x daily for the scattered, superficial buttocks areas. -okay to use nystatin powder to groin and inguinal fold areas   Increase activity slowly   Complete by: As directed         Discharge Medications     Allergies as of 06/20/2020   No Known Allergies     Medication List    STOP taking these medications   amLODipine 10 MG tablet Commonly known as: NORVASC   hydrALAZINE 50 MG tablet Commonly known as: APRESOLINE   spironolactone 25 MG tablet Commonly known as: ALDACTONE     TAKE these medications   acetaminophen 325 MG tablet Commonly known as: TYLENOL Take 2 tablets (650 mg total) by mouth every 6 (six) hours as needed for mild pain, fever or headache (or Fever >/= 101). What changed:   medication strength  See the new instructions.   B-D ULTRAFINE III SHORT PEN 31G X 8 MM Misc Generic drug: Insulin Pen Needle USE ONCE DAILY WITH LANTUS SOLOSTAR PEN.   cetirizine 10 MG tablet Commonly known as: ZYRTEC TAKE 1 TABLET BY MOUTH ONCE A DAY.   citalopram 10 MG tablet Commonly known as: CELEXA TAKE 1 TABLET BY MOUTH ONCE A DAY.   Cod Liver Oil Caps Take by mouth. 500 MG  per patient   Combigan 0.2-0.5 % ophthalmic solution Generic drug: brimonidine-timolol Place 1 drop into both eyes every 12 (twelve) hours.   Cranberry 1000 MG Caps Take 1,000 mg by mouth daily.   cycloSPORINE 0.05 % ophthalmic emulsion Commonly known as: RESTASIS Place 1 drop into both eyes 2 (two) times daily.   diltiazem 120 MG 24 hr capsule Commonly known as: Cardizem CD Take 1 capsule (120 mg total) by mouth daily. What  changed:   medication strength  See the new instructions.   ezetimibe 10 MG tablet Commonly known as: ZETIA TAKE 1 TABLET BY MOUTH ONCE A DAY.   fluticasone 50 MCG/ACT nasal spray Commonly known as: FLONASE PLACE 2 SPRAYS INTO BOTH NOSTRILS DAILY.   gabapentin 300 MG capsule Commonly known as: NEURONTIN TAKE (1) CAPSULE BY MOUTH TWICE DAILY. What changed: See the new instructions.   HYDROcodone-acetaminophen 5-325 MG tablet Commonly known as: Norco Take 1 tablet by mouth every 6 (six) hours as needed for moderate pain or severe pain. What changed:   when to take this  reasons to take this   insulin lispro 100 UNIT/ML KwikPen Commonly known as: HumaLOG KwikPen Inject 0-0.1 mLs (0-10 Units total) into the skin 4 (four) times daily -  before meals and at bedtime. --Insulin Lispro (HumaloG) injection 0-10 Units  0-10 Units Subcutaneous, 3 times daily with meals  CBG < 70: Implement Hypoglycemia Standing Orders and refer to Hypoglycemia Standing Orders sidebar report   CBG 70 - 120: 0 unit CBG 121 - 150: 0 unit  CBG 151 - 200: 1 unit  CBG 201 - 250: 2 units  CBG 251 - 300: 4 units  CBG 301 - 350: 6 units   CBG 351 - 400: 8 units  CBG > 400: 10 units   Lantus SoloStar 100 UNIT/ML Solostar Pen Generic drug: insulin glargine Inject 6 Units into the skin daily. Start taking on: June 21, 2020 What changed: See the new instructions.   linaclotide 72 MCG capsule Commonly known as: Linzess Take 1 capsule (72 mcg total) by mouth daily before breakfast.   linagliptin 5 MG Tabs tablet Commonly known as: Tradjenta Take 1 tablet (5 mg total) by mouth daily. Start taking on: July 11, 2020 What changed:   how much to take  These instructions start on July 11, 2020. If you are unsure what to do until then, ask your doctor or other care provider.   liver oil-zinc oxide 40 % ointment Commonly known as: DESITIN Apply topically 3 (three) times daily.   losartan 25 MG  tablet Commonly known as: COZAAR Take 1 tablet (25 mg total) by mouth daily. What changed:   medication strength  See the new instructions.   multivitamin with minerals Tabs tablet Take 1 tablet by mouth at bedtime.   nystatin powder Commonly known as: MYCOSTATIN/NYSTOP Apply topically 3 (three) times daily.   OneTouch Delica Plus [id number] Misc USE AS DIRECTED 3 TIMES DAILY FOR BLOOD GLUCOSE TESTING.   OneTouch Verio test strip Generic drug: glucose blood USE AS DIRECTED TO TEST BLOOD GLUCOSE 3 TIMES DAILY.   pantoprazole 40 MG tablet Commonly known as: PROTONIX TAKE 1 TABLET BY MOUTH ONCE A DAY, 30 MINUTES BEFORE BREAKFAST. What changed: See the new instructions.   predniSONE 5 MG tablet Commonly known as: DELTASONE Take 1 tablet (5 mg total) by mouth daily.   ProAir HFA 108 (90 Base) MCG/ACT inhaler Generic drug: albuterol INHALE 2 PUFFS BY MOUTH EVERY 6 HOURS AS NEEDED .  pyridostigmine 60 MG tablet Commonly known as: MESTINON TAKE (1) TABLET BY MOUTH THREE TIMES A DAY. What changed: See the new instructions.   rosuvastatin 40 MG tablet Commonly known as: CRESTOR TAKE (1) TABLET BY MOUTH AT BEDTIME. What changed: See the new instructions.   solifenacin 10 MG tablet Commonly known as: VESICARE TAKE 1 TABLET BY MOUTH ONCE A DAY.   UNABLE TO FIND Diabetic shoes x 1 Inserts x 3 DX E11.9   vancomycin 125 MG capsule Commonly known as: VANCOCIN Take 1 capsule (125 mg total) by mouth 4 (four) times daily for 7 days. Through 06/27/2020   venlafaxine XR 150 MG 24 hr capsule Commonly known as: EFFEXOR-XR TAKE 1 CAPSULE BY MOUTH DAILY.   Vitamin D3 50 MCG (2000 UT) Tabs Take 2,000 Units by mouth at bedtime.            Discharge Care Instructions  (From admission, onward)         Start     Ordered   06/20/20 0000  Discharge wound care:       Comments: -Ulcer in Perineum --POA--Lt > Rt--- looks like pressure ulcers from thighs rubbing together---  --please see photos in epic, wound care consult requested -Wound care RN recommends :-cleanse wounds on bilateral perineum and right shin with soap and water. Place a small piece of Xeroform gauze Kellie Simmering [id number]) over the wounds, then a small foam dressing. -buttock wounds--- POA--- wound care specialist recommends Desitin 3 x daily for the scattered, superficial buttocks areas. -okay to use nystatin powder to groin and inguinal fold areas   06/20/20 1231          Major procedures and Radiology Reports - PLEASE review detailed and final reports for all details, in brief -   CT ABDOMEN PELVIS WO CONTRAST  Result Date: 06/12/2020 CLINICAL DATA:  Left lower quadrant abdominal pain. EXAM: CT ABDOMEN AND PELVIS WITHOUT CONTRAST TECHNIQUE: Multidetector CT imaging of the abdomen and pelvis was performed following the standard protocol without IV contrast. COMPARISON:  None. FINDINGS: Lower chest: The lung bases are clear. The heart size is normal. Hepatobiliary: The liver is normal. Status post cholecystectomy.There is no biliary ductal dilation. Pancreas: Normal contours without ductal dilatation. No peripancreatic fluid collection. Spleen: Unremarkable. Adrenals/Urinary Tract: --Adrenal glands: Unremarkable. --Right kidney/ureter: There is a nonobstructing stone in the upper pole the right kidney. --Left kidney/ureter: No hydronephrosis or radiopaque kidney stones. --Urinary bladder: Unremarkable. Stomach/Bowel: --Stomach/Duodenum: There is a large hiatal hernia. --Small bowel: Unremarkable. --Colon: There is diffuse circumferential wall thickening of much of the descending colon and sigmoid colon. 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 There are enlarged adjacent regional lymph nodes. There is a moderate amount of stool in the remaining portions of the colon. There may be a single inflamed diverticulum in the ascending colon (axial series 2, image 37). --Appendix: Not visualized. No right lower quadrant  inflammation or free fluid. Vascular/Lymphatic: Atherosclerotic calcification is present within the non-aneurysmal abdominal aorta, without hemodynamically significant stenosis. --No retroperitoneal lymphadenopathy. --there are mildly enlarged mesenteric lymph nodes. There are multiple enlarged regional lymph nodes along the course of the descending colon. --No pelvic or inguinal lymphadenopathy. Reproductive: There is a complex cystic mass in the right hemipelvis likely associated with the right ovary measuring 3.9 x 2.6 cm (axial series 2, image 69). There is a small cystic structure involving the left ovary measuring approximately 1.8 cm. The patient is status post prior hysterectomy. Other: No ascites or free air. The abdominal wall is  normal. Musculoskeletal. No acute displaced fractures. IMPRESSION: 1. Diffuse circumferential wall thickening of much of the descending colon and sigmoid colon with multiple enlarged adjacent regional lymph nodes. Findings are concerning for an infectious or inflammatory colitis. Correlation with patient's symptoms and laboratory studies is recommended. As an underlying mass cannot be excluded, a follow-up colonoscopy is recommended as an outpatient. 2. Complex cystic mass in the right hemipelvis likely associated with the right ovary measuring up to 3.9 cm. Follow-up with a nonemergent outpatient pelvic ultrasound is recommended. 3. Large hiatal hernia. 4. Nonobstructive right nephrolithiasis. Aortic Atherosclerosis (ICD10-I70.0). Electronically Signed   By: Constance Holster M.D.   On: 06/12/2020 04:00    Micro Results  Recent Results (from the past 240 hour(s))  Gastrointestinal Panel by PCR , Stool     Status: None   Collection Time: 06/12/20 12:50 AM   Specimen: Rectum; Stool  Result Value Ref Range Status   Campylobacter species NOT DETECTED NOT DETECTED Final   Plesimonas shigelloides NOT DETECTED NOT DETECTED Final   Salmonella species NOT DETECTED NOT DETECTED  Final   Yersinia enterocolitica NOT DETECTED NOT DETECTED Final   Vibrio species NOT DETECTED NOT DETECTED Final   Vibrio cholerae NOT DETECTED NOT DETECTED Final   Enteroaggregative E coli (EAEC) NOT DETECTED NOT DETECTED Final   Enteropathogenic E coli (EPEC) NOT DETECTED NOT DETECTED Final   Enterotoxigenic E coli (ETEC) NOT DETECTED NOT DETECTED Final   Shiga like toxin producing E coli (STEC) NOT DETECTED NOT DETECTED Final   Shigella/Enteroinvasive E coli (EIEC) NOT DETECTED NOT DETECTED Final   Cryptosporidium NOT DETECTED NOT DETECTED Final   Cyclospora cayetanensis NOT DETECTED NOT DETECTED Final   Entamoeba histolytica NOT DETECTED NOT DETECTED Final   Giardia lamblia NOT DETECTED NOT DETECTED Final   Adenovirus F40/41 NOT DETECTED NOT DETECTED Final   Astrovirus NOT DETECTED NOT DETECTED Final   Norovirus GI/GII NOT DETECTED NOT DETECTED Final   Rotavirus A NOT DETECTED NOT DETECTED Final   Sapovirus (I, II, IV, and V) NOT DETECTED NOT DETECTED Final    Comment: Performed at [facility], [address] [place], [place] [address]  SARS Coronavirus 2 by RT PCR (hospital order, performed in [place] hospital lab) Nasopharyngeal Nasopharyngeal Swab     Status: None   Collection Time: 06/12/20  4:48 AM   Specimen: Nasopharyngeal Swab  Result Value Ref Range Status   SARS Coronavirus 2 NEGATIVE NEGATIVE Final    Comment: (NOTE) SARS-CoV-2 target nucleic acids are NOT DETECTED.  The SARS-CoV-2 RNA is generally detectable in upper and lower respiratory specimens during the acute phase of infection. The lowest concentration of SARS-CoV-2 viral copies this assay can detect is 250 copies / mL. A negative result does not preclude SARS-CoV-2 infection and should not be used as the sole basis for treatment or other patient management decisions.  A negative result may occur with improper specimen collection / handling, submission of specimen other than nasopharyngeal  swab, presence of viral mutation(s) within the areas targeted by this assay, and inadequate number of viral copies (<250 copies / mL). A negative result must be combined with clinical observations, patient history, and epidemiological information.  Fact Sheet for Patients:   StrictlyIdeas.no  Fact Sheet for Healthcare Providers: BankingDealers.co.za  This test is not yet approved or  cleared by the [place] FDA and has been authorized for detection and/or diagnosis of SARS-CoV-2 by FDA under an Emergency Use Authorization (EUA).  This EUA will remain  in effect (meaning this test can be used) for the duration of the COVID-19 declaration under Section 564(b)(1) of the Act, 21 U.S.C. section 360bbb-3(b)(1), unless the authorization is terminated or revoked sooner.  Performed at [facility], [address] [place], [place] [address]   C Difficile Quick Screen w PCR reflex     Status: Abnormal   Collection Time: 06/15/20  6:08 PM   Specimen: STOOL  Result Value Ref Range Status   C Diff antigen POSITIVE (A) NEGATIVE Final   C Diff toxin NEGATIVE NEGATIVE Final   C Diff interpretation Results are indeterminate. See PCR results.  Final    Comment: Performed at [facility], [address] [place], [place] [address]  C. Diff by PCR, Reflexed     Status: Abnormal   Collection Time: 06/15/20  6:08 PM  Result Value Ref Range Status   Toxigenic C. Difficile by PCR POSITIVE (A) NEGATIVE Final    Comment: Positive for toxigenic C. difficile with little to no toxin production. Only treat if clinical presentation suggests symptomatic illness. Performed at [facility], [place] [address], [place], [place] [address]        Today   Subjective    Grayson White today has no new complaints  -Frequency and volume of stools have improved significantly,         Patient has been seen and examined prior to discharge   Objective   Blood  pressure (!) 119/49, pulse 65, temperature 99.1 F (37.3 C), temperature source Oral, resp. rate 16, height _0  (1.676 m), weight 104.1 kg, SpO2 97 %.   Intake/Output Summary (Last 24 hours) at 06/20/2020 1243 Last data filed at 06/20/2020 0900 Gross per 24 hour  Intake 480 ml  Output 300 ml  Net 180 ml    Exam Gen:- Awake Alert, in no acute distress HEENT:- [place].AT, No sclera icterus Neck-Supple Neck,No JVD,.  Lungs-  CTAB , fair symmetrical air movement CV- S1, S2 normal, regular  Abd-  +ve B.Sounds, Abd Soft, abdominal discomfort on palpation without significant tenderness per se    Extremity/Skin:- No  edema, pedal pulses present  Psych-affect is appropriate, oriented x3 Neuro-generalized weakness, no new focal deficits, no tremors GU- Ulcer in Perineum--Lt > Rt--- looks like pressure ulcers from thighs rubbing together--- please see photos in epic   Data Review   CBC w Diff:  Lab Results  Component Value Date   WBC 14.5 (H) 06/20/2020   HGB 7.5 (L) 06/20/2020   HCT 22.9 (L) 06/20/2020   PLT 410 (H) 06/20/2020   LYMPHOPCT 9 06/15/2020   MONOPCT 11 06/15/2020   EOSPCT 2 06/15/2020   BASOPCT 0 06/15/2020    CMP:  Lab Results  Component Value Date   NA 134 (L) 06/20/2020   K 3.9 06/20/2020   CL 93 (L) 06/20/2020   CO2 32 06/20/2020   BUN 16 06/20/2020   CREATININE 1.18 (H) 06/20/2020   CREATININE 1.11 (H) 02/10/2020   PROT 4.9 (L) 06/20/2020   ALBUMIN 1.5 (L) 06/20/2020   BILITOT 0.5 06/20/2020   ALKPHOS 104 06/20/2020   AST 31 06/20/2020   ALT 17 06/20/2020  .   Total Discharge time is about 33 minutes  Roxan Hockey M.D on 06/20/2020 at 12:43 PM  Go to [email or web address] -  for contact info  [facility] - Office  [phone]

## 2020-06-20 NOTE — TOC Transition Note (Signed)
Transition of Care [facility]) - CM/SW Discharge Note   Patient Details  Name: [name] MRN: [id number] Date of Birth: [birth year]  Transition of Care [facility]) CM/SW Contact:  Boneta Lucks, RN Phone Number: 06/20/2020, 12:49 PM   Clinical Narrative:   Insurance Auth came in Sunday. [facility] is ready for patient, RN to call report.    Final next level of care: Skilled Nursing Facility Barriers to Discharge: Barriers Resolved   Patient Goals and CMS Choice Patient states their goals for this hospitalization and ongoing recovery are:: to go to SNF. CMS Medicare.gov Compare Post Acute Care list provided to:: Patient Choice offered to / list presented to : Patient  Discharge Placement              Patient chooses bed at: [facility] Patient to be transferred to facility by: [facility] staff   Patient and family notified of of transfer: 06/20/20  Discharge Plan and [place]

## 2020-06-21 ENCOUNTER — Other Ambulatory Visit: Payer: Self-pay | Admitting: Adult Health

## 2020-06-21 ENCOUNTER — Encounter: Payer: Self-pay | Admitting: Adult Health

## 2020-06-21 ENCOUNTER — Ambulatory Visit: Payer: Medicare Other | Admitting: Family Medicine

## 2020-06-21 ENCOUNTER — Other Ambulatory Visit (HOSPITAL_COMMUNITY): Payer: Self-pay | Admitting: Adult Health

## 2020-06-21 ENCOUNTER — Non-Acute Institutional Stay (SKILLED_NURSING_FACILITY): Payer: Medicare Other | Admitting: Adult Health

## 2020-06-21 DIAGNOSIS — G4733 Obstructive sleep apnea (adult) (pediatric): Secondary | ICD-10-CM

## 2020-06-21 DIAGNOSIS — J3089 Other allergic rhinitis: Secondary | ICD-10-CM | POA: Insufficient documentation

## 2020-06-21 DIAGNOSIS — F418 Other specified anxiety disorders: Secondary | ICD-10-CM

## 2020-06-21 DIAGNOSIS — D62 Acute posthemorrhagic anemia: Secondary | ICD-10-CM

## 2020-06-21 DIAGNOSIS — A0472 Enterocolitis due to Clostridium difficile, not specified as recurrent: Secondary | ICD-10-CM | POA: Diagnosis not present

## 2020-06-21 DIAGNOSIS — J449 Chronic obstructive pulmonary disease, unspecified: Secondary | ICD-10-CM

## 2020-06-21 DIAGNOSIS — R1900 Intra-abdominal and pelvic swelling, mass and lump, unspecified site: Secondary | ICD-10-CM

## 2020-06-21 DIAGNOSIS — R3915 Urgency of urination: Secondary | ICD-10-CM

## 2020-06-21 DIAGNOSIS — E1169 Type 2 diabetes mellitus with other specified complication: Secondary | ICD-10-CM

## 2020-06-21 DIAGNOSIS — G7000 Myasthenia gravis without (acute) exacerbation: Secondary | ICD-10-CM

## 2020-06-21 DIAGNOSIS — K5909 Other constipation: Secondary | ICD-10-CM

## 2020-06-21 DIAGNOSIS — N183 Chronic kidney disease, stage 3 unspecified: Secondary | ICD-10-CM

## 2020-06-21 DIAGNOSIS — E785 Hyperlipidemia, unspecified: Secondary | ICD-10-CM

## 2020-06-21 DIAGNOSIS — I129 Hypertensive chronic kidney disease with stage 1 through stage 4 chronic kidney disease, or unspecified chronic kidney disease: Secondary | ICD-10-CM

## 2020-06-21 DIAGNOSIS — E1122 Type 2 diabetes mellitus with diabetic chronic kidney disease: Secondary | ICD-10-CM | POA: Diagnosis not present

## 2020-06-21 DIAGNOSIS — E1142 Type 2 diabetes mellitus with diabetic polyneuropathy: Secondary | ICD-10-CM

## 2020-06-21 DIAGNOSIS — K219 Gastro-esophageal reflux disease without esophagitis: Secondary | ICD-10-CM

## 2020-06-21 DIAGNOSIS — Z794 Long term (current) use of insulin: Secondary | ICD-10-CM

## 2020-06-21 DIAGNOSIS — E1140 Type 2 diabetes mellitus with diabetic neuropathy, unspecified: Secondary | ICD-10-CM

## 2020-06-21 DIAGNOSIS — H40053 Ocular hypertension, bilateral: Secondary | ICD-10-CM

## 2020-06-21 NOTE — Care Management Important Message (Signed)
Important Message  Patient Details  Name: [name] MRN: [id number] Date of Birth: July 19, 1941   Medicare Important Message Given:  Yes Lattie Haw, RN delivered letter due to contact precautions)     Tommy Medal 06/21/2020, 9:47 AM

## 2020-06-21 NOTE — Progress Notes (Signed)
Location:    [facility] Room Number: 153/P Place of Service:  SNF (31)   CODE STATUS: Full Code  No Known Allergies  Chief Complaint  Patient presents with  . Hospitalization Follow-up    Hospitalization Follow Up    HPI:  She is a 79 year old woman who has been hospitalized from 06-11-20 through 06-20-20. She had several weeks of diarrhea with vomiting present. She was admitted with colitis and c-diff and acute blood loss anemia. She is here for short term rehab with her goal to return back home. She states that she is having > 5 stools per day; but are more formed. She denies any abdominal pain; and denies any vomiting. She will continue to be followed for her chronic illnesses including: diabetes hypertension; depression.   Past Medical History:  Diagnosis Date  . Anemia in chronic renal disease 12/30/2015  . Anxiety   . Arthritis    "all over" (01/19/2014)  . Arthritis, lumbar spine   . Carpal tunnel syndrome   . Chronic bronchitis ([place])    "got it q yr for awhile; hasn't had it in awhile" (01/19/2014)  . Chronic kidney disease (CKD) stage G3b/A1, moderately decreased glomerular filtration rate (GFR) between 30-44 mL/min/1.73 square meter and albuminuria creatinine ratio less than 30 mg/g 09/14/2009   Qualifier: Diagnosis of  By: Moshe Cipro MD, Joycelyn Schmid    . Chronic renal disease, stage 3, moderately decreased glomerular filtration rate (GFR) between 30-59 mL/min/1.73 square meter 09/14/2009   Qualifier: Diagnosis of  By: Moshe Cipro MD, Joycelyn Schmid    . Complication of anesthesia    combative  . COPD (chronic obstructive pulmonary disease) ([place])   . Depression   . Diverticulosis 07/2004   Colonscopy Dr Gala Romney  . Esophagitis, erosive 2009  . Exertional shortness of breath   . Gastroesophageal reflux   . ZOXWRUEA(540.9)    "usually a couple times/wk" (01/19/2014)  . Heart murmur    saw cardiology [place], he told her she did not need to come back.  .  Hyperlipidemia   . Hypertension   . Impingement syndrome, shoulder   . Iron deficiency anemia   . Mitral regurgitation   . Myasthenia gravis   . Myasthenia gravis in remission ([place]) 11/24/2014  . Myasthenia gravis in remission ([place])   . Obesity   . OSA on CPAP    Negative on last sleep study  . Pneumonia 01/2012  . Pulmonary HTN ([place])   . Schatzki's ring    Last EGD w/ dilation 02/08/11, 2009 & 2007  . Seasonal allergies   . Shoulder pain   . Thyroid disease    "used to take RX; they took me off it" (01/19/2014)  . Tobacco abuse   . Type II diabetes mellitus ([place])     Past Surgical History:  Procedure Laterality Date  . [place] VITRECTOMY WITH 20 GAUGE MVR PORT Left 01/19/2014   Procedure: 25 GAUGE PARS PLANA VITRECTOMY WITH 20 GAUGE MVR PORT; MEMBRAME PEEL; SERUM PATCH; LASER TREATMENT; C3F8;  Surgeon: Hayden Pedro, MD;  Location: [facility];  Service: Ophthalmology;  Laterality: Left;  . ABDOMINAL HYSTERECTOMY    . AGILE CAPSULE N/A 08/05/2019   Procedure: AGILE CAPSULE;  Surgeon: Daneil Dolin, MD;  [facility];  Service: Endoscopy;  Laterality: N/A;  7:30am  . APPENDECTOMY    . CARPAL TUNNEL RELEASE Bilateral   . CATARACT EXTRACTION W/PHACO Left 10/21/2013   Procedure: LEFT CATARACT EXTRACTION  PHACO AND INTRAOCULAR LENS PLACEMENT (IOC);  Surgeon: Marylynn Pearson, MD;  Location: [facility];  Service: Ophthalmology;  Laterality: Left;  . CHOLECYSTECTOMY    . COLONOSCOPY  05/08/2012   EXH:BZJIRCVE and external hemorrhoids; colonic diverticulosis  . COLONOSCOPY WITH PROPOFOL N/A 02/26/2019   two simple adenomas, diverticulosis,  and internal hemorrhoids.   . ESOPHAGEAL DILATION     "more than 3 times" (01/19/2014)  . ESOPHAGOGASTRODUODENOSCOPY  02/08/11   [place]-Distal esophageal erosion consistent with mild erosive reflux   esophagitis/ Noncritical Schatzki ring, small hiatal hernia otherwise upper/ gastrointestinal tract appeared unremarkable, status post passage  of a  Maloney dilation to biopsy disruption of the ring described  . ESOPHAGOGASTRODUODENOSCOPY  04/2012   2 tandem incomplete distal esophagea rings s/p dilation.   . ESOPHAGOGASTRODUODENOSCOPY N/A 09/16/2014   Dr. Gala Romney: Schatzki ring status post dilation/disruption.  Hiatal hernia.  Marland Kitchen ESOPHAGOGASTRODUODENOSCOPY (EGD) WITH PROPOFOL N/A 12/08/2018   EGD with mild Schatzki ring s/p dilation, small hiatal hernia, otherwise normal  . EYE SURGERY     cataracts, bilateral  . GIVENS CAPSULE STUDY N/A 09/29/2019   Procedure: GIVENS CAPSULE STUDY;  Surgeon: Daneil Dolin, MD;  [facility];  Service: Endoscopy;  Laterality: N/A;  7:30am  . INCONTINENCE SURGERY  08/26/09   [place]  . JOINT REPLACEMENT     right total knee  . MALONEY DILATION N/A 09/16/2014   Procedure: Venia Minks DILATION;  Surgeon: Daneil Dolin, MD;  [facility];  Service: Endoscopy;  Laterality: N/A;  . Venia Minks DILATION N/A 12/08/2018   Procedure: Venia Minks DILATION;  Surgeon: Daneil Dolin, MD;  [facility];  Service: Endoscopy;  Laterality: N/A;  . PARS PLANA VITRECTOMY W/ REPAIR OF MACULAR HOLE Left 01/19/2014  . POLYPECTOMY  02/26/2019   Procedure: POLYPECTOMY;  Surgeon: Danie Binder, MD;  [facility];  Service: Endoscopy;;  ascending colon polyps x2  . SAVORY DILATION N/A 09/16/2014   Procedure: SAVORY DILATION;  Surgeon: Daneil Dolin, MD;  [facility];  Service: Endoscopy;  Laterality: N/A;  . TONSILLECTOMY    . TOTAL KNEE ARTHROPLASTY Right 05/13/07   Dr. Aline Brochure  . TRIGGER FINGER RELEASE Right 06/26/2018   Procedure: RIGHT INDEX FINGER TRIGGER FINGER/A-1 PULLEY RELEASE;  Surgeon: Carole Civil, MD;  Location: [facility];  Service: Orthopedics;  Laterality: Right;    Social History   Socioeconomic History  . Marital status: Single    Spouse name: Not on file  . Number of children: 2  . Years of education: Not on file  . Highest education level: Not on file    Occupational History  . Occupation: disabled    Employer: RETIRED  Tobacco Use  . Smoking status: Former Smoker    Packs/day: 0.50    Years: 25.00    Pack years: 12.50    Types: Cigarettes    Quit date: 01/01/2004    Years since quitting: 16.4  . Smokeless tobacco: Never Used  Vaping Use  . Vaping Use: Never used  Substance and Sexual Activity  . Alcohol use: No  . Drug use: No  . Sexual activity: Never    Birth control/protection: Surgical  Other Topics Concern  . Not on file  Social History Narrative   Patient lives at home by herself   Patient drinks one cup of coffee a day   Patient is right handed.   Social Determinants of Health   Financial Resource Strain:   . Difficulty  of Paying Living Expenses:   Food Insecurity:   . Worried About Charity fundraiser in the Last Year:   . Arboriculturist in the Last Year:   Transportation Needs:   . Film/video editor (Medical):   Marland Kitchen Lack of Transportation (Non-Medical):   Physical Activity:   . Days of Exercise per Week:   . Minutes of Exercise per Session:   Stress:   . Feeling of Stress :   Social Connections:   . Frequency of Communication with Friends and Family:   . Frequency of Social Gatherings with Friends and Family:   . Attends Religious Services:   . Active Member of Clubs or Organizations:   . Attends Archivist Meetings:   Marland Kitchen Marital Status:   Intimate Partner Violence:   . Fear of Current or Ex-Partner:   . Emotionally Abused:   Marland Kitchen Physically Abused:   . Sexually Abused:    Family History  Problem Relation Age of Onset  . Cancer Mother   . Heart disease Mother   . Cancer Father   . Heart disease Father   . Diabetes Sister   . Hypertension Brother   . Heart disease Brother   . Cancer Sister   . Kidney failure Sister   . Diabetes Son   . Hypertension Son   . Hypertension Daughter   . Colon cancer Neg Hx       VITAL SIGNS BP 124/70   Pulse 69   Temp (!) 97.1 F (36.2 C)  (Oral)   Resp 20   Ht 5\' 6"  (1.676 m)   Wt 242 lb 8 oz (110 kg)   SpO2 99%   BMI 39.14 kg/m   Outpatient Encounter Medications as of 06/21/2020  Medication Sig  . acetaminophen (TYLENOL) 325 MG tablet Take 2 tablets (650 mg total) by mouth every 6 (six) hours as needed for mild pain, fever or headache (or Fever >/= 101).  Roseanne Kaufman [place]-Castor Oil (VENELEX) OINT Apply topically in the morning, at noon, and at bedtime. ointment; - ; amt: ribbon; topical  . brimonidine-timolol (COMBIGAN) 0.2-0.5 % ophthalmic solution Place 1 drop into both eyes every 12 (twelve) hours.  . cetirizine (ZYRTEC) 10 MG tablet TAKE 1 TABLET BY MOUTH ONCE A DAY.  Marland Kitchen Cholecalciferol (VITAMIN D3) 2000 units TABS Take 2,000 Units by mouth at bedtime.  . citalopram (CELEXA) 10 MG tablet TAKE 1 TABLET BY MOUTH ONCE A DAY.  Marland Kitchen Cranberry 1000 MG CAPS Take 1,000 mg by mouth daily.  . cycloSPORINE (RESTASIS) 0.05 % ophthalmic emulsion Place 1 drop into both eyes 2 (two) times daily.   Marland Kitchen diltiazem (CARDIZEM CD) 120 MG 24 hr capsule Take 1 capsule (120 mg total) by mouth daily.  Marland Kitchen ezetimibe (ZETIA) 10 MG tablet TAKE 1 TABLET BY MOUTH ONCE A DAY.  . fluticasone (FLONASE) 50 MCG/ACT nasal spray PLACE 2 SPRAYS INTO BOTH NOSTRILS DAILY.  Marland Kitchen gabapentin (NEURONTIN) 300 MG capsule TAKE (1) CAPSULE BY MOUTH TWICE DAILY.  Marland Kitchen HYDROcodone-acetaminophen (NORCO) 5-325 MG tablet Take 1 tablet by mouth every 6 (six) hours as needed for moderate pain or severe pain.  Marland Kitchen insulin glargine (LANTUS SOLOSTAR) 100 UNIT/ML Solostar Pen Inject 6 Units into the skin at bedtime.  . insulin lispro (INSULIN LISPRO) 100 UNIT/ML KwikPen Junior insulin pen; 100 unit/mL; amt: Per Sliding Scale; If Blood Sugar is 0 to 149, give 0 Units. If Blood Sugar is greater than 149, give 3 Units.subcutaneous Before Meals 07:30  AM, 11:30 AM, 04:30 PM  . linaclotide (LINZESS) 72 MCG capsule Take 1 capsule (72 mcg total) by mouth daily before breakfast.  . [START ON 07/11/2020]  linagliptin (TRADJENTA) 5 MG TABS tablet Take 1 tablet (5 mg total) by mouth daily.  Marland Kitchen losartan (COZAAR) 25 MG tablet Take 1 tablet (25 mg total) by mouth daily.  . Multiple Vitamin (MULTIVITAMIN WITH MINERALS) TABS tablet Take 1 tablet by mouth at bedtime.  . NON FORMULARY Diet: _____ Regular, ______ NAS, _______Consistent Carbohydrate, _______NPO ___X__Other :SOFT DIET  . nystatin (NYSTATIN) powder Apply 1 application topically daily as needed. Special Instructions: fungal rash to groin and inguinal folds  . pantoprazole (PROTONIX) 40 MG tablet TAKE 1 TABLET BY MOUTH ONCE A DAY, 30 MINUTES BEFORE BREAKFAST.  Marland Kitchen predniSONE (DELTASONE) 5 MG tablet Take 1 tablet (5 mg total) by mouth daily.  Marland Kitchen PROAIR HFA 108 (90 Base) MCG/ACT inhaler INHALE 2 PUFFS BY MOUTH EVERY 6 HOURS AS NEEDED .  Marland Kitchen pyridostigmine (MESTINON) 60 MG tablet TAKE (1) TABLET BY MOUTH THREE TIMES A DAY.  . rosuvastatin (CRESTOR) 40 MG tablet TAKE (1) TABLET BY MOUTH AT BEDTIME.  Marland Kitchen solifenacin (VESICARE) 10 MG tablet TAKE 1 TABLET BY MOUTH ONCE A DAY.  Marland Kitchen vancomycin (VANCOCIN) 125 MG capsule Take 1 capsule (125 mg total) by mouth 4 (four) times daily for 7 days. Through 06/27/2020  . venlafaxine XR (EFFEXOR-XR) 150 MG 24 hr capsule TAKE 1 CAPSULE BY MOUTH DAILY.  Marland Kitchen B-D ULTRAFINE III SHORT PEN 31G X 8 MM MISC USE ONCE DAILY WITH LANTUS SOLOSTAR PEN. (Patient not taking: Reported on 06/21/2020)  . Cod Liver Oil CAPS Take by mouth. 500 MG per patient (Patient not taking: Reported on 06/21/2020)  . Lancets (ONETOUCH DELICA PLUS MEQAST41D) MISC USE AS DIRECTED 3 TIMES DAILY FOR BLOOD GLUCOSE TESTING. (Patient not taking: Reported on 06/21/2020)  . UNABLE TO FIND Diabetic shoes x 1 Inserts x 3 DX E11.9 (Patient not taking: Reported on 06/21/2020)   No facility-administered encounter medications on file as of 06/21/2020.     SIGNIFICANT DIAGNOSTIC EXAMS  TODAY  06-12-20: ct of abdomen and pelvis 1. Diffuse circumferential wall thickening  of much of the descending colon and sigmoid colon with multiple enlarged adjacent regional lymph nodes. Findings are concerning for an infectious or inflammatory colitis. a follow-up colonoscopy is recommended as an outpatient. 2. Complex cystic mass in the right hemipelvis likely associated with the right ovary measuring up to 3.9 cm. Follow-up with a nonemergent outpatient pelvic ultrasound is recommended. 3. Large hiatal hernia. 4. Nonobstructive right nephrolithiasis. Aortic Atherosclerosis  LABS REVIEWED TODAY;   06-12-20: wbc 17.7; hgb 9.1; hct 29.0; mcv 84.8 plt 560; glucose 250; bun 35; creat 1.68; k+ 3.2; na++ 135; ca 8.2 liver normal albumin 2.3 mag 1.5 phos 3.0 hgb a1c 6.9 06-14-20: wbc 13.9; hgb 8.0; hct 25.1; mcv 84.2 plt 415; glucose 99; bun 22; creat 1.21; k+ 3.2; na++ 135; ca 7.3 06-15-20: + c-diff 06-17-20: wbc 14.8; hgb 8.4; hct 25.7; mcv 83.2 plt 355; glucose 82; bun 16; creat 1.22; k+ 4.5; na++ 135; ca 7.8 phos 2.4 albumin 1.8 mag 1.6 06-20-20: wbc 14.5; hgb 7.5; hct 22.9; mcv 81.2 plt 410; glucose 80; bun 16; creat 1.18; k+ 3.9; na++ 134; ca 7.7 liver normal albumin 1.5 mag 1.8   Review of Systems  Constitutional: Negative for malaise/fatigue.  Respiratory: Negative for cough and shortness of breath.   Cardiovascular: Negative for chest pain, palpitations and leg swelling.  Gastrointestinal:  Negative for abdominal pain and heartburn.       Has > 5 stools per day are somewhat formed   Musculoskeletal: Negative for back pain, joint pain and myalgias.  Skin: Negative.   Neurological: Positive for weakness. Negative for dizziness.  Psychiatric/Behavioral: The patient is not nervous/anxious.      Physical Exam Constitutional:      General: She is not in acute distress.    Appearance: She is well-developed. She is morbidly obese. She is not diaphoretic.  Neck:     Thyroid: No thyromegaly.  Cardiovascular:     Rate and Rhythm: Normal rate and regular rhythm.     Pulses:  Normal pulses.     Heart sounds: Normal heart sounds.  Pulmonary:     Effort: Pulmonary effort is normal. No respiratory distress.     Breath sounds: Normal breath sounds.  Abdominal:     General: Bowel sounds are normal. There is no distension.     Palpations: Abdomen is soft.     Tenderness: There is no abdominal tenderness.  Musculoskeletal:        General: Normal range of motion.     Cervical back: Neck supple.     Right lower leg: No edema.     Left lower leg: No edema.  Lymphadenopathy:     Cervical: No cervical adenopathy.  Skin:    General: Skin is warm and dry.  Neurological:     Mental Status: She is alert and oriented to person, place, and time.  Psychiatric:        Mood and Affect: Mood normal.        ASSESSMENT/ PLAN:  TODAY  1. C. Difficile colitis: is stable will complete vancomycin through 06-27-20 and will begin probiotic twice daily   2. Hypertension associated with stage 3 chronic kidney disease due to type 2 diabetes mellitus: is stable b/p 124/70: will continue cardizem cd 120 mg daily cozaar 25 mg daily   3. Obstructive sleep apnea: is stable will continue CPAP nightly   4. Chronic non-seasonal allergic rhinitis: is stable will continue flonase daily zyrtec 10 mg daily   5. Gastroesophageal reflux disease without esophagitis: is stable will stop protonix due to c-diff; will begin pepcid 40 mg every evening.   6. Type 2 diabetes mellitus with diabetic neuropathy with long term current use of insulin: is stable hgb a1c will continue lantus 6 units nightly and humalog 3 units with meals on 07-11-20 will start tradjenta 5 mg daily   7. Hyperlipidemia associated with type 2 diabetes mellitus: is stable will continue crestor 40 mg daily zetia 10 mg daily   8. Myasthenia gravis in remission: is stable will continue mestinon 60 mg three times daily prednisone 5 mg daily   9. Diabetic peripheral neuropathy associated with type 2 diabetes mellitus: is stable  will continue gabapentin 300 mg twice daily has vicodn 5/325 mg every 6 hours as needed through 06-24-20.   10. Depression with anxiety: is stable will continue effexor xr 150 mg daily celexa 10 mg daily   11. Urinary urgency: is stable will continue vesicare 10 mg daily   12. Pelvic mass in female: right side: is due for pelvic ultrasound: 07-08-20  13. Acute blood loss anemia: is without change hgb 7.5 will monitor is due to have colonoscopy  14. Chronic pulmonary obstructive disease unspecified COPD type: is stable will continue albuterol 2 puffs every 6 hours  15. Increased intraocular pressure bilateral: is stable will continue [place]  to both eyes twice daily   16. Chronic constipation: is stable will stop linzess due her c-diff.   17. Protein calorie malnutrition severe: albumin 1.5 will start prostat 30 ml three times daily         MD is aware of resident's narcotic use and is in agreement with current plan of care. We will attempt to wean resident as appropriate.  Ok Edwards NP [facility]  Contact [phone] Monday through Friday 8am- 5pm  After hours call [phone]

## 2020-06-22 ENCOUNTER — Non-Acute Institutional Stay (SKILLED_NURSING_FACILITY): Payer: Medicare Other | Admitting: Internal Medicine

## 2020-06-22 ENCOUNTER — Encounter: Payer: Self-pay | Admitting: Internal Medicine

## 2020-06-22 DIAGNOSIS — E1140 Type 2 diabetes mellitus with diabetic neuropathy, unspecified: Secondary | ICD-10-CM | POA: Diagnosis not present

## 2020-06-22 DIAGNOSIS — D649 Anemia, unspecified: Secondary | ICD-10-CM | POA: Insufficient documentation

## 2020-06-22 DIAGNOSIS — N179 Acute kidney failure, unspecified: Secondary | ICD-10-CM | POA: Diagnosis not present

## 2020-06-22 DIAGNOSIS — A0472 Enterocolitis due to Clostridium difficile, not specified as recurrent: Secondary | ICD-10-CM | POA: Diagnosis not present

## 2020-06-22 DIAGNOSIS — R1900 Intra-abdominal and pelvic swelling, mass and lump, unspecified site: Secondary | ICD-10-CM

## 2020-06-22 DIAGNOSIS — Z794 Long term (current) use of insulin: Secondary | ICD-10-CM

## 2020-06-22 NOTE — Assessment & Plan Note (Addendum)
Patient describes no improvement in her diarrhea today; yesterday she told the NP that stools were formed.  Staff verified four watery stools today.  If the diarrhea does not respond to the vancomycin orally begun 6/17; fecal transplants could be considered.

## 2020-06-22 NOTE — Patient Instructions (Signed)
See assessment and plan under each diagnosis in the problem list and acutely for this visit 

## 2020-06-22 NOTE — Assessment & Plan Note (Addendum)
Colonoscopy post resolution of all colitis symptoms. No bleeding dyscrasias reported. Monitor CBC at SNF.

## 2020-06-22 NOTE — Assessment & Plan Note (Signed)
Basal insulin will be titrated upward as oral intake improves and glucoses rise.

## 2020-06-22 NOTE — Assessment & Plan Note (Signed)
Pelvic ultrasound was to be scheduled 2-4 weeks post discharge.

## 2020-06-22 NOTE — Progress Notes (Signed)
NURSING HOME LOCATION:  [place] ROOM NUMBER: 153/P   CODE STATUS:  Full Code  PCP:  Fayrene Helper,   This is a comprehensive admission note to [facility] performed on this date less than 30 days from date of admission. Included are preadmission medical/surgical history; reconciled medication list; family history; social history and comprehensive review of systems.  Corrections and additions to the records were documented. Comprehensive physical exam was also performed. Additionally a clinical summary was entered for each active diagnosis pertinent to this admission in the Problem List to enhance continuity of care.  HPI: Patient was hospitalized 6/12-6/21/2021 with C. difficile colitis complicated by hypokalemia & AKI.  The colitis was in the context of recurrent courses of antibiotic therapy for recurrent sinus infections and bronchitis.  Initially patient was treated with IV Cipro and Flagyl.  Stool for C. difficile was inadvertently not performed at admission. These antibiotics were discontinued 6/17 to allow retesting for C. difficile which came back positive.  PO vancomycin was initiated on 6/17. Hemoglobin did drop from 11.2 to 7.5; acute blood loss versus hemodilution was questioned. Outpatient colonoscopy will be planned once the patient is stable post SNF. An incidental finding on CT of the abdomen/pelvis on 6/13 was a 3.9 cm complex cystic mass in the right hemipelvis likely associated with the right ovary.  An incidental finding was nonobstructing R nephrolithiasis as well. Wound care consulted for perineal ulcers which clinically were suggested to be pressure ulcers from friction.  Buttock wounds were also present on admission. Because of the decreased oral intake,Lantus dose was decreased to 6 units daily.  Sliding scale insulin was also prescribed with meals. Lab monitor was to be with CBC and BMP weekly on Thursdays starting 6/24. Pelvic ultrasound was  to be scheduled within 2-4 weeks of discharge.  Follow-up colonoscopy for the colitis was to be scheduled 6-8 weeks post discharge.  Past medical and surgical history: Includes diabetes with CKD stage III, hypothyroidism, dyslipidemia, iron deficiency anemia, history of anxiety/depression, GERD, OSA, pulmonary hypertension, and myasthenia gravis in remission on Mestinon. Surgeries include abdominal hysterectomy, esophageal dilation, and cholecystectomy.  She has had multiple EGD procedures.  Social history: Nondrinker; former smoker having quit in 2005  Family history: Extensive history reviewed   Review of systems: She tells me that her diarrhea is not any better; yet no bowel movements are charted by staff today.  Yesterday she told the NP that the stools were formed.  She also complains of some discomfort in the left inguinal area; she has known perineal ulcers.  She denies any bleeding dyscrasias despite the profound anemia. She denies any upper or lower respiratory tract infection symptoms. She does validate some depression about her condition.  Constitutional: No fever, significant weight change  Eyes: No redness, discharge, pain, vision change ENT/mouth: No nasal congestion, purulent discharge, earache, change in hearing, sore throat  Cardiovascular: No chest pain, palpitations, paroxysmal nocturnal dyspnea, claudication, edema  Respiratory: No cough, sputum production, hemoptysis, DOE, significant snoring, apnea  Gastrointestinal: No heartburn, dysphagia, abdominal pain, nausea /vomiting, rectal bleeding, melena Genitourinary: No dysuria, hematuria, pyuria, incontinence, nocturia Musculoskeletal: No joint stiffness, joint swelling, weakness, pain Neurologic: No dizziness, headache, syncope, seizures, numbness, tingling Psychiatric: No  insomnia, anorexia Endocrine: No change in hair/skin/nails, excessive thirst, excessive hunger, excessive urination  Hematologic/lymphatic: No  significant bruising, lymphadenopathy, abnormal bleeding Allergy/immunology: No itchy/watery eyes, significant sneezing, urticaria, angioedema  Physical exam:  Pertinent or positive findings: She is morbidly obese.  Affect is markedly flat.  Upper lids are somewhat puffy.  Ptosis is present, greater on the left.  The left sclera slightly muddy without jaundice.  She is not wearing the upper plate.  Breath sounds are decreased at the bases but otherwise clear.  Grade 5/4-9 systolic murmur is present at the right base.  Abdomen is protuberant.  Pedal pulses are decreased.  The right ankle was dressed.  There is marked fusiform enlargement the knees.  An operative scar is present over the right knee. General appearance: Adequately nourished; no acute distress, increased work of breathing is present.   Lymphatic: No lymphadenopathy about the head, neck, axilla. Eyes: No conjunctival inflammation or lid edema is present. There is no scleral icterus. Ears:  External ear exam shows no significant lesions or deformities.   Nose:  External nasal examination shows no deformity or inflammation. Nasal mucosa are pink and moist without lesions, exudates Oral exam: Lips and gums are healthy appearing.There is no oropharyngeal erythema or exudate. Neck:  No thyromegaly, masses, tenderness noted.    Heart:  Normal rate and regular rhythm. S1 and S2 normal without gallop, murmur, click, rub.  Lungs: Chest clear to auscultation without wheezes, rhonchi, rales, rubs. Abdomen: Bowel sounds are normal.  Abdomen is soft and nontender with no organomegaly, hernias, masses. GU: Deferred  Extremities:  No cyanosis, clubbing, edema. Neurologic exam:  Strength equal  in upper & lower extremities. Balance, Rhomberg, finger to nose testing could not be completed due to clinical state Deep tendon reflexes are equal Skin: Warm & dry w/o tenting. No significant lesions or rash.  See clinical summary under each active problem  in the Problem List with associated updated therapeutic plan

## 2020-06-22 NOTE — Assessment & Plan Note (Addendum)
6/12-6/21/2021 in the context of C. difficile colitis Creatinine peaked at 1.68 with GFR 34.  At discharge creatinine was 1.18 with GFR 52 indicating stage IIIa CKD. Incidental finding on CT of the abdomen/pelvis was nonobstructing nephrolithiasis

## 2020-06-28 ENCOUNTER — Encounter: Payer: Self-pay | Admitting: Adult Health

## 2020-06-28 ENCOUNTER — Non-Acute Institutional Stay (SKILLED_NURSING_FACILITY): Payer: Medicare Other | Admitting: Adult Health

## 2020-06-28 DIAGNOSIS — N183 Chronic kidney disease, stage 3 unspecified: Secondary | ICD-10-CM

## 2020-06-28 DIAGNOSIS — E1122 Type 2 diabetes mellitus with diabetic chronic kidney disease: Secondary | ICD-10-CM

## 2020-06-28 DIAGNOSIS — E1142 Type 2 diabetes mellitus with diabetic polyneuropathy: Secondary | ICD-10-CM | POA: Diagnosis not present

## 2020-06-28 DIAGNOSIS — I129 Hypertensive chronic kidney disease with stage 1 through stage 4 chronic kidney disease, or unspecified chronic kidney disease: Secondary | ICD-10-CM | POA: Diagnosis not present

## 2020-06-28 DIAGNOSIS — A0472 Enterocolitis due to Clostridium difficile, not specified as recurrent: Secondary | ICD-10-CM

## 2020-06-28 NOTE — Progress Notes (Signed)
Location:    [facility] Room Number: 153/P Place of Service:  SNF (31)   CODE STATUS: Full Code  No Known Allergies  Chief Complaint  Patient presents with  . Short Term Rehab        C. 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     HPI:  She is a 79 year old short term rehab patient being seen for the management of her chronic illnesses: colitis; peripheral neuropathy hypertension. She is having left lower leg pain which is presently not being managed with her gabapentin. She continues to have loose stools; has completed the vancomycin for her c-diff. She denies any abdominal pain. She continues to participate in therapy.   Past Medical History:  Diagnosis Date  . Anemia in chronic renal disease 12/30/2015  . Anxiety   . Arthritis    "all over" (01/19/2014)  . Arthritis, lumbar spine   . Carpal tunnel syndrome   . Chronic bronchitis ([place])    "got it q yr for awhile; hasn't had it in awhile" (01/19/2014)  . Chronic kidney disease (CKD) stage G3b/A1, moderately decreased glomerular filtration rate (GFR) between 30-44 mL/min/1.73 square meter and albuminuria creatinine ratio less than 30 mg/g 09/14/2009   Qualifier: Diagnosis of  By: Moshe Cipro MD, Joycelyn Schmid    . Chronic renal disease, stage 3, moderately decreased glomerular filtration rate (GFR) between 30-59 mL/min/1.73 square meter 09/14/2009   Qualifier: Diagnosis of  By: Moshe Cipro MD, Joycelyn Schmid    . Complication of anesthesia    combative  . COPD (chronic obstructive pulmonary disease) ([place])   . Depression   . Diverticulosis 07/2004   Colonscopy Dr Gala Romney  . Esophagitis, erosive 2009  . Exertional shortness of breath   . Gastroesophageal reflux   . VOZDGUYQ(034.7)    "usually a couple times/wk"  (01/19/2014)  . Heart murmur    saw cardiology [place], he told her she did not need to come back.  . Hyperlipidemia   . Hypertension   . Impingement syndrome, shoulder   . Iron deficiency anemia   . Mitral regurgitation   . Myasthenia gravis   . Myasthenia gravis in remission ([place]) 11/24/2014  . Myasthenia gravis in remission ([facility])   . Obesity   . OSA on CPAP    Negative on last sleep study  . Pneumonia 01/2012  . Pulmonary HTN ([place])   . Schatzki's ring    Last EGD w/ dilation 02/08/11, 2009 & 2007  . Seasonal allergies   . Shoulder pain   . Thyroid disease    "used to take RX; they took me off it" (01/19/2014)  . Tobacco abuse   . Type II diabetes mellitus ([place])     Past Surgical History:  Procedure Laterality Date  . [place] VITRECTOMY WITH 20 GAUGE MVR PORT Left 01/19/2014   Procedure: 25 GAUGE PARS PLANA VITRECTOMY WITH 20 GAUGE MVR PORT; MEMBRAME PEEL; SERUM PATCH; LASER TREATMENT; C3F8;  Surgeon: Hayden Pedro, MD;  Location: [facility];  Service: Ophthalmology;  Laterality: Left;  . ABDOMINAL HYSTERECTOMY    . AGILE CAPSULE N/A 08/05/2019   Procedure: AGILE CAPSULE;  Surgeon: Daneil Dolin, MD;  [facility];  Service: Endoscopy;  Laterality: N/A;  7:30am  . APPENDECTOMY    .  CARPAL TUNNEL RELEASE Bilateral   . CATARACT EXTRACTION W/PHACO Left 10/21/2013   Procedure: LEFT CATARACT EXTRACTION PHACO AND INTRAOCULAR LENS PLACEMENT (IOC);  Surgeon: Marylynn Pearson, MD;  Location: [facility];  Service: Ophthalmology;  Laterality: Left;  . CHOLECYSTECTOMY    . COLONOSCOPY  05/08/2012   ONG:EXBMWUXL and external hemorrhoids; colonic diverticulosis  . COLONOSCOPY WITH PROPOFOL N/A 02/26/2019   two simple adenomas, diverticulosis,  and internal hemorrhoids.   . ESOPHAGEAL DILATION     "more than 3 times" (01/19/2014)  . ESOPHAGOGASTRODUODENOSCOPY  02/08/11   [place]-Distal esophageal erosion consistent with mild erosive reflux   esophagitis/ Noncritical Schatzki ring,  small hiatal hernia otherwise upper/ gastrointestinal tract appeared unremarkable, status post passage  of a Maloney dilation to biopsy disruption of the ring described  . ESOPHAGOGASTRODUODENOSCOPY  04/2012   2 tandem incomplete distal esophagea rings s/p dilation.   . ESOPHAGOGASTRODUODENOSCOPY N/A 09/16/2014   Dr. Gala Romney: Schatzki ring status post dilation/disruption.  Hiatal hernia.  Marland Kitchen ESOPHAGOGASTRODUODENOSCOPY (EGD) WITH PROPOFOL N/A 12/08/2018   EGD with mild Schatzki ring s/p dilation, small hiatal hernia, otherwise normal  . EYE SURGERY     cataracts, bilateral  . GIVENS CAPSULE STUDY N/A 09/29/2019   Procedure: GIVENS CAPSULE STUDY;  Surgeon: Daneil Dolin, MD;  [facility];  Service: Endoscopy;  Laterality: N/A;  7:30am  . INCONTINENCE SURGERY  08/26/09   [place]  . JOINT REPLACEMENT     right total knee  . MALONEY DILATION N/A 09/16/2014   Procedure: Venia Minks DILATION;  Surgeon: Daneil Dolin, MD;  [facility];  Service: Endoscopy;  Laterality: N/A;  . Venia Minks DILATION N/A 12/08/2018   Procedure: Venia Minks DILATION;  Surgeon: Daneil Dolin, MD;  [facility];  Service: Endoscopy;  Laterality: N/A;  . PARS PLANA VITRECTOMY W/ REPAIR OF MACULAR HOLE Left 01/19/2014  . POLYPECTOMY  02/26/2019   Procedure: POLYPECTOMY;  Surgeon: Danie Binder, MD;  [facility];  Service: Endoscopy;;  ascending colon polyps x2  . SAVORY DILATION N/A 09/16/2014   Procedure: SAVORY DILATION;  Surgeon: Daneil Dolin, MD;  [facility];  Service: Endoscopy;  Laterality: N/A;  . TONSILLECTOMY    . TOTAL KNEE ARTHROPLASTY Right 05/13/07   Dr. Aline Brochure  . TRIGGER FINGER RELEASE Right 06/26/2018   Procedure: RIGHT INDEX FINGER TRIGGER FINGER/A-1 PULLEY RELEASE;  Surgeon: Carole Civil, MD;  Location: [facility];  Service: Orthopedics;  Laterality: Right;    Social History   Socioeconomic History  . Marital status: Single    Spouse name: Not on  file  . Number of children: 2  . Years of education: Not on file  . Highest education level: Not on file  Occupational History  . Occupation: disabled    Employer: RETIRED  Tobacco Use  . Smoking status: Former Smoker    Packs/day: 0.50    Years: 25.00    Pack years: 12.50    Types: Cigarettes    Quit date: 01/01/2004    Years since quitting: 16.5  . Smokeless tobacco: Never Used  Vaping Use  . Vaping Use: Never used  Substance and Sexual Activity  . Alcohol use: No  . Drug use: No  . Sexual activity: Never    Birth control/protection: Surgical  Other Topics Concern  . Not on file  Social History Narrative   Patient lives at home by herself   Patient drinks one cup of coffee a day   Patient is  right handed.   Social Determinants of Health   Financial Resource Strain:   . Difficulty of Paying Living Expenses:   Food Insecurity:   . Worried About Charity fundraiser in the Last Year:   . Arboriculturist in the Last Year:   Transportation Needs:   . Film/video editor (Medical):   Marland Kitchen Lack of Transportation (Non-Medical):   Physical Activity:   . Days of Exercise per Week:   . Minutes of Exercise per Session:   Stress:   . Feeling of Stress :   Social Connections:   . Frequency of Communication with Friends and Family:   . Frequency of Social Gatherings with Friends and Family:   . Attends Religious Services:   . Active Member of Clubs or Organizations:   . Attends Archivist Meetings:   Marland Kitchen Marital Status:   Intimate Partner Violence:   . Fear of Current or Ex-Partner:   . Emotionally Abused:   Marland Kitchen Physically Abused:   . Sexually Abused:    Family History  Problem Relation Age of Onset  . Cancer Mother   . Heart disease Mother   . Cancer Father   . Heart disease Father   . Diabetes Sister   . Hypertension Brother   . Heart disease Brother   . Cancer Sister   . Kidney failure Sister   . Diabetes Son   . Hypertension Son   . Hypertension  Daughter   . Colon cancer Neg Hx       VITAL SIGNS BP (!) 148/67   Pulse 88   Temp 98.3 F (36.8 C) (Oral)   Resp 20   Ht 5\' 6"  (1.676 m)   Wt 240 lb 6.4 oz (109 kg)   SpO2 99%   BMI 38.80 kg/m   Outpatient Encounter Medications as of 06/28/2020  Medication Sig  . acetaminophen (TYLENOL) 325 MG tablet Take 2 tablets (650 mg total) by mouth every 6 (six) hours as needed for mild pain, fever or headache (or Fever >/= 101).  . Amino Acids-Protein Hydrolys (FEEDING SUPPLEMENT, PRO-STAT SUGAR FREE 64,) LIQD Take 30 mLs by mouth 3 (three) times daily with meals.  Roseanne Kaufman [place]-Castor Oil (VENELEX) OINT Apply topically in the morning, at noon, and at bedtime. ointment; - ; amt: ribbon; topical  . brimonidine-timolol (COMBIGAN) 0.2-0.5 % ophthalmic solution Place 1 drop into both eyes every 12 (twelve) hours.  . cetirizine (ZYRTEC) 10 MG tablet TAKE 1 TABLET BY MOUTH ONCE A DAY.  Marland Kitchen Cholecalciferol (VITAMIN D3) 2000 units TABS Take 2,000 Units by mouth at bedtime.  . citalopram (CELEXA) 10 MG tablet TAKE 1 TABLET BY MOUTH ONCE A DAY.  Marland Kitchen Cranberry 1000 MG CAPS Take 1,000 mg by mouth daily.  . cycloSPORINE (RESTASIS) 0.05 % ophthalmic emulsion Place 1 drop into both eyes 2 (two) times daily.   Marland Kitchen diltiazem (CARDIZEM CD) 120 MG 24 hr capsule Take 1 capsule (120 mg total) by mouth daily.  Marland Kitchen ezetimibe (ZETIA) 10 MG tablet TAKE 1 TABLET BY MOUTH ONCE A DAY.  . famotidine (PEPCID) 40 MG tablet Take 40 mg by mouth every evening.  . fluticasone (FLONASE) 50 MCG/ACT nasal spray PLACE 2 SPRAYS INTO BOTH NOSTRILS DAILY.  Marland Kitchen gabapentin (NEURONTIN) 300 MG capsule TAKE (1) CAPSULE BY MOUTH TWICE DAILY.  Marland Kitchen insulin glargine (LANTUS SOLOSTAR) 100 UNIT/ML Solostar Pen Inject 6 Units into the skin at bedtime.   . insulin lispro (HUMALOG KWIKPEN) 100 UNIT/ML KwikPen insulin  pen; 100 unit/mL; amt: Per Sliding Scale; If Blood Sugar is 0 to 149, give 0 Units. If Blood Sugar is greater than 149, give 3  Units. subcutaneous Before Meals  . [START ON 07/11/2020] linagliptin (TRADJENTA) 5 MG TABS tablet Take 1 tablet (5 mg total) by mouth daily.  Marland Kitchen losartan (COZAAR) 25 MG tablet Take 1 tablet (25 mg total) by mouth daily.  . Multiple Vitamin (MULTIVITAMIN WITH MINERALS) TABS tablet Take 1 tablet by mouth at bedtime.  . NON FORMULARY Diet: _____ Regular, ______ NAS, _______Consistent Carbohydrate, _______NPO ___X__Other :SOFT DIET  . nystatin (NYSTATIN) powder Apply 1 application topically daily as needed. Special Instructions: fungal rash to groin and inguinal folds  . predniSONE (DELTASONE) 5 MG tablet Take 1 tablet (5 mg total) by mouth daily.  Marland Kitchen PROAIR HFA 108 (90 Base) MCG/ACT inhaler INHALE 2 PUFFS BY MOUTH EVERY 6 HOURS AS NEEDED .  Marland Kitchen Probiotic Product (RISA-BID PROBIOTIC) TABS Take 1 tablet by mouth 2 (two) times daily.  Marland Kitchen pyridostigmine (MESTINON) 60 MG tablet TAKE (1) TABLET BY MOUTH THREE TIMES A DAY.  . rosuvastatin (CRESTOR) 40 MG tablet TAKE (1) TABLET BY MOUTH AT BEDTIME.  Marland Kitchen solifenacin (VESICARE) 10 MG tablet TAKE 1 TABLET BY MOUTH ONCE A DAY.  Marland Kitchen venlafaxine XR (EFFEXOR-XR) 150 MG 24 hr capsule TAKE 1 CAPSULE BY MOUTH DAILY.  . [DISCONTINUED] HYDROcodone-acetaminophen (NORCO) 5-325 MG tablet Take 1 tablet by mouth every 6 (six) hours as needed for moderate pain or severe pain.   No facility-administered encounter medications on file as of 06/28/2020.     SIGNIFICANT DIAGNOSTIC EXAMS   PREVIOUS   06-12-20: ct of abdomen and pelvis 1. Diffuse circumferential wall thickening of much of the descending colon and sigmoid colon with multiple enlarged adjacent regional lymph nodes. Findings are concerning for an infectious or inflammatory colitis. a follow-up colonoscopy is recommended as an outpatient. 2. Complex cystic mass in the right hemipelvis likely associated with the right ovary measuring up to 3.9 cm. Follow-up with a nonemergent outpatient pelvic ultrasound is  recommended. 3. Large hiatal hernia. 4. Nonobstructive right nephrolithiasis. Aortic Atherosclerosis  NO NEW EXAMS.   LABS REVIEWED PREVIOUS  06-12-20: wbc 17.7; hgb 9.1; hct 29.0; mcv 84.8 plt 560; glucose 250; bun 35; creat 1.68; k+ 3.2; na++ 135; ca 8.2 liver normal albumin 2.3 mag 1.5 phos 3.0 hgb a1c 6.9 06-14-20: wbc 13.9; hgb 8.0; hct 25.1; mcv 84.2 plt 415; glucose 99; bun 22; creat 1.21; k+ 3.2; na++ 135; ca 7.3 06-15-20: + c-diff 06-17-20: wbc 14.8; hgb 8.4; hct 25.7; mcv 83.2 plt 355; glucose 82; bun 16; creat 1.22; k+ 4.5; na++ 135; ca 7.8 phos 2.4 albumin 1.8 mag 1.6 06-20-20: wbc 14.5; hgb 7.5; hct 22.9; mcv 81.2 plt 410; glucose 80; bun 16; creat 1.18; k+ 3.9; na++ 134; ca 7.7 liver normal albumin 1.5 mag 1.8   NO NEW LABS.   Review of Systems  Constitutional: Negative for malaise/fatigue.  Respiratory: Negative for cough and shortness of breath.   Cardiovascular: Negative for chest pain, palpitations and leg swelling.  Gastrointestinal: Positive for diarrhea. Negative for abdominal pain and heartburn.  Musculoskeletal: Positive for back pain. Negative for joint pain and myalgias.  Skin: Negative.   Neurological: Positive for weakness. Negative for dizziness.  Psychiatric/Behavioral: The patient is not nervous/anxious.      Physical Exam Constitutional:      General: She is not in acute distress.    Appearance: She is well-developed. She is morbidly obese. She  is not diaphoretic.  Neck:     Thyroid: No thyromegaly.  Cardiovascular:     Rate and Rhythm: Normal rate and regular rhythm.     Pulses: Normal pulses.     Heart sounds: Normal heart sounds.  Pulmonary:     Effort: Pulmonary effort is normal. No respiratory distress.     Breath sounds: Normal breath sounds.  Abdominal:     General: Bowel sounds are normal. There is no distension.     Palpations: Abdomen is soft.     Tenderness: There is no abdominal tenderness.  Musculoskeletal:        General: Normal  range of motion.     Cervical back: Neck supple.     Right lower leg: No edema.     Left lower leg: No edema.  Lymphadenopathy:     Cervical: No cervical adenopathy.  Skin:    General: Skin is warm and dry.  Neurological:     Mental Status: She is alert and oriented to person, place, and time.  Psychiatric:        Mood and Affect: Mood normal.      ASSESSMENT/ PLAN:  TODAY  1. C. Difficile colitis: is without change continues to have loose stools; has completed vancomycin will continue probiotic will setup GI and will monitor her status.   2.diabetic peripheral neuropathy associated with type 2 diabetes mellitus: is having worsening left leg pain; will increase gabapentin to 300 mg three times daily and will monitor her status; is off vicodin.   3. Hypertension associated with stage 3 chronic kidney disease due to type 2 diabetes mellitus is stable b/p 148/67 will continue cozaar 25 mg daily cardizem cd 120 mg daily   PREVIOUS   4. Obstructive sleep apnea: is stable will continue CPAP nightly   5. Chronic non-seasonal allergic rhinitis: is stable will continue flonase daily zyrtec 10 mg daily   6. Gastroesophageal reflux disease without esophagitis: is stable will stop protonix due to c-diff; will begin pepcid 40 mg every evening.   7. Type 2 diabetes mellitus with diabetic neuropathy with long term current use of insulin: is stable hgb a1c 6.9 will continue lantus 6 units nightly and humalog 3 units with meals on 07-11-20 will start tradjenta 5 mg daily   8. Hyperlipidemia associated with type 2 diabetes mellitus: is stable will continue crestor 40 mg daily zetia 10 mg daily   9. Myasthenia gravis in remission: is stable will continue mestinon 60 mg three times daily prednisone 5 mg daily   10. Depression with anxiety: is stable will continue effexor xr 150 mg daily celexa 10 mg daily   11. Urinary urgency: is stable will continue vesicare 10 mg daily   12. Pelvic mass in  female: right side: is due for pelvic ultrasound: 07-08-20  13. Acute blood loss anemia: is without change hgb 7.5 will monitor is due to have colonoscopy  14. Chronic pulmonary obstructive disease unspecified COPD type: is stable will continue albuterol 2 puffs every 6 hours  15. Increased intraocular pressure bilateral: is stable will continue combigan to both eyes twice daily   16. Chronic constipation: is stable will stop linzess due her c-diff.   17. Protein calorie malnutrition severe: albumin 1.5 will start prostat 30 ml three times daily      MD is aware of resident's narcotic use and is in agreement with current plan of care. We will attempt to wean resident as appropriate.  Ok Edwards NP [facility]  Contact [phone] Monday through Friday 8am- 5pm  After hours call [phone]

## 2020-06-30 NOTE — Progress Notes (Signed)
Referring Provider: Fayrene Helper, MD Primary Care Physician:  Fayrene Helper, MD Primary GI Physician: Dr.  Gala Romney  Chief Complaint  Patient presents with  . Follow-up    c diff, pt still with diarrhea, blood in stool    HPI:   [name] is a 79 y.o. 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 Prior evaluation withEGD in Dec 2019 with mild Schatzki ring s/p dilation, small hiatal hernia, otherwise normal, and colonoscopy Feb 2020 with two simple adenomas, diverticulosis, and hemorrhoids, capsule study September 2020 with small bowel AVMs and erosions, following with Hematology. She presents today for C. Diff.   We last saw patient 04/29/20 for possible hemorrhoid banding due to increased bleeding with BMs and report of hemorrhoid tissue prolapsing at her visit in March 2021. However, patient was weak and unable to ambulate on her own and was directed to the emergency room.   In the ED, she was diagnosed with acute recurrent sinusitis. Hemoglobin stable at 11.2. WBC elevated at 12.6. Chest x-ray negative. She had already been on amoxicillin which was started on April 13. She was prescribed doxycycline x10 days.  Patient admitted 06/11/2020-06/20/2020 primarily for C. difficile colitis, acute kidney injury, and hypokalemia. CBC with white count 17.7, hemoglobin 9.1, platelets 560. Lipase normal. CMP with potassium 3.2. Creatinine 1.68. Mild elevation of alk phos, normal AST, ALT, and total bilirubin. CT A/P without contrast 06/12/2020 with diffuse circumferential wall thickening of much of descending and sigmoid colon, moderate amount of stool in the remaining portions of the colon, may be a single inflamed diverticulum in the ascending colon. Underlying mass cannot be excluded, follow-up colonoscopy recommended as outpatient. Also with complex cystic mass in the right hemipelvis likely associated with right ovary  measuring up to 3.9 cm with recommendations for outpatient pelvic ultrasound. This is scheduled for 7/9. Patient was initially treated with IV Cipro and Flagyl from 6/13-6/17. This was switched to vancomycin when C. difficile came back positive. Per discharge summary, volume and frequency of stools had significantly improved. She was discharged on vancomycin 125 mg 4 times daily x7 days. Notably, hemoglobin had trended down from 11.2-7.5 on day of discharge. Query of acute blood loss versus hemodilution with recommendations for repeat CBC every Thursday and need for outpatient colonoscopy. Wound care consulted for perineal ulcers which clinically were suggested to be pressure ulcers from friction.  Buttock wounds were also present on admission. Patient was discharged to [facility].  Today: Patient reports having diarrhea for about 4 weeks prior to hospitalization. She was having about 10-15 BMs daily. Completed vancomycin Monday. While on vancomycin, stool consistency improved somewhat to mushy stools with some decrease in stool frequency but overall, diarrhea did not resolve. Monday and Tuesday she continued to do okay but Wednesday watery stools returned. Reports 10+ BMs daily along with nocturnal stools. BMs anytime she eats or drinks.  States her stool was red with blood initially but this is improving. Rectal bleeding was present prior to C. difficile. She continues with a small amount of blood in the toilet water with most BMs. Associated rectal burning with known hemorrhoids. She does not have anything to use for hemorrhoids at the [facility]. No melena. No NSAIDs.   Continues with LLQ abdominal pain.  About the same as when she was in the hospital. Not worsening.  No fever. Mild nausea. Doesn't have anything to help with this. No vomiting. Daughter  states she eats well as she makes sure of this.   She is on prednisone chronically due to history of myasthenia gravis which is in  remission.  Past Medical History:  Diagnosis Date  . Anemia in chronic renal disease 12/30/2015  . Anxiety   . Arthritis    "all over" (01/19/2014)  . Arthritis, lumbar spine   . Carpal tunnel syndrome   . Chronic bronchitis ([place])    "got it q yr for awhile; hasn't had it in awhile" (01/19/2014)  . Chronic kidney disease (CKD) stage G3b/A1, moderately decreased glomerular filtration rate (GFR) between 30-44 mL/min/1.73 square meter and albuminuria creatinine ratio less than 30 mg/g 09/14/2009   Qualifier: Diagnosis of  By: Moshe Cipro MD, Joycelyn Schmid    . Chronic renal disease, stage 3, moderately decreased glomerular filtration rate (GFR) between 30-59 mL/min/1.73 square meter 09/14/2009   Qualifier: Diagnosis of  By: Moshe Cipro MD, Joycelyn Schmid    . Complication of anesthesia    combative  . COPD (chronic obstructive pulmonary disease) ([place])   . Depression   . Diverticulosis 07/2004   Colonscopy Dr Gala Romney  . Esophagitis, erosive 2009  . Exertional shortness of breath   . Gastroesophageal reflux   . GOTLXBWI(203.5)    "usually a couple times/wk" (01/19/2014)  . Heart murmur    saw cardiology In [place], he told her she did not need to come back.  . Hyperlipidemia   . Hypertension   . Impingement syndrome, shoulder   . Iron deficiency anemia   . Mitral regurgitation   . Myasthenia gravis   . Myasthenia gravis in remission ([place]) 11/24/2014  . Myasthenia gravis in remission ([place])   . Obesity   . OSA on CPAP    Negative on last sleep study  . Pneumonia 01/2012  . Pulmonary HTN ([place])   . Schatzki's ring    Last EGD w/ dilation 02/08/11, 2009 & 2007  . Seasonal allergies   . Shoulder pain   . Thyroid disease    "used to take RX; they took me off it" (01/19/2014)  . Tobacco abuse   . Type II diabetes mellitus ([place])     Past Surgical History:  Procedure Laterality Date  . [place] VITRECTOMY WITH 20 GAUGE MVR PORT Left 01/19/2014   Procedure: 25 GAUGE PARS PLANA VITRECTOMY WITH 20  GAUGE MVR PORT; MEMBRAME PEEL; SERUM PATCH; LASER TREATMENT; C3F8;  Surgeon: Hayden Pedro, MD;  Location: [facility];  Service: Ophthalmology;  Laterality: Left;  . ABDOMINAL HYSTERECTOMY    . AGILE CAPSULE N/A 08/05/2019   Procedure: AGILE CAPSULE;  Surgeon: Daneil Dolin, MD;  [facility];  Service: Endoscopy;  Laterality: N/A;  7:30am  . APPENDECTOMY    . CARPAL TUNNEL RELEASE Bilateral   . CATARACT EXTRACTION W/PHACO Left 10/21/2013   Procedure: LEFT CATARACT EXTRACTION PHACO AND INTRAOCULAR LENS PLACEMENT (IOC);  Surgeon: Marylynn Pearson, MD;  Location: [facility];  Service: Ophthalmology;  Laterality: Left;  . CHOLECYSTECTOMY    . COLONOSCOPY  05/08/2012   [facility]:CBULAGTX and external hemorrhoids; colonic diverticulosis  . COLONOSCOPY WITH PROPOFOL N/A 02/26/2019   two simple adenomas, diverticulosis,  and internal hemorrhoids.   . ESOPHAGEAL DILATION     "more than 3 times" (01/19/2014)  . ESOPHAGOGASTRODUODENOSCOPY  02/08/11   [place]-Distal esophageal erosion consistent with mild erosive reflux   esophagitis/ Noncritical Schatzki ring, small hiatal hernia otherwise upper/ gastrointestinal tract appeared unremarkable, status post passage  of a Maloney dilation to biopsy disruption  of the ring described  . ESOPHAGOGASTRODUODENOSCOPY  04/2012   2 tandem incomplete distal esophagea rings s/p dilation.   . ESOPHAGOGASTRODUODENOSCOPY N/A 09/16/2014   Dr. Gala Romney: Schatzki ring status post dilation/disruption.  Hiatal hernia.  Marland Kitchen ESOPHAGOGASTRODUODENOSCOPY (EGD) WITH PROPOFOL N/A 12/08/2018   EGD with mild Schatzki ring s/p dilation, small hiatal hernia, otherwise normal  . EYE SURGERY     cataracts, bilateral  . GIVENS CAPSULE STUDY N/A 09/29/2019   Procedure: GIVENS CAPSULE STUDY;  Surgeon: Daneil Dolin, MD;  [facility];  Service: Endoscopy;  Laterality: N/A;  7:30am  . INCONTINENCE SURGERY  08/26/09   [place]  . JOINT REPLACEMENT     right total knee  . MALONEY DILATION N/A  09/16/2014   Procedure: Venia Minks DILATION;  Surgeon: Daneil Dolin, MD;  [facility];  Service: Endoscopy;  Laterality: N/A;  . Venia Minks DILATION N/A 12/08/2018   Procedure: Venia Minks DILATION;  Surgeon: Daneil Dolin, MD;  [facility];  Service: Endoscopy;  Laterality: N/A;  . PARS PLANA VITRECTOMY W/ REPAIR OF MACULAR HOLE Left 01/19/2014  . POLYPECTOMY  02/26/2019   Procedure: POLYPECTOMY;  Surgeon: Danie Binder, MD;  [facility];  Service: Endoscopy;;  ascending colon polyps x2  . SAVORY DILATION N/A 09/16/2014   Procedure: SAVORY DILATION;  Surgeon: Daneil Dolin, MD;  [facility];  Service: Endoscopy;  Laterality: N/A;  . TONSILLECTOMY    . TOTAL KNEE ARTHROPLASTY Right 05/13/07   Dr. Aline Brochure  . TRIGGER FINGER RELEASE Right 06/26/2018   Procedure: RIGHT INDEX FINGER TRIGGER FINGER/A-1 PULLEY RELEASE;  Surgeon: Carole Civil, MD;  [facility];  Service: Orthopedics;  Laterality: Right;    Outpatient Encounter Medications as of 07/01/2020  Medication Sig  . acetaminophen (TYLENOL) 325 MG tablet Take 2 tablets (650 mg total) by mouth every 6 (six) hours as needed for mild pain, fever or headache (or Fever >/= 101).  . Amino Acids-Protein Hydrolys (FEEDING SUPPLEMENT, PRO-STAT SUGAR FREE 64,) LIQD Take 30 mLs by mouth 3 (three) times daily with meals.  Roseanne Kaufman [place]-Castor Oil (VENELEX) OINT Apply topically in the morning, at noon, and at bedtime. ointment; - ; amt: ribbon; topical  . brimonidine-timolol (COMBIGAN) 0.2-0.5 % ophthalmic solution Place 1 drop into both eyes every 12 (twelve) hours.  . cetirizine (ZYRTEC) 10 MG tablet TAKE 1 TABLET BY MOUTH ONCE A DAY.  Marland Kitchen Cholecalciferol (VITAMIN D3) 2000 units TABS Take 2,000 Units by mouth at bedtime.  . citalopram (CELEXA) 10 MG tablet TAKE 1 TABLET BY MOUTH ONCE A DAY.  Marland Kitchen Cranberry 1000 MG CAPS Take 1,000 mg by mouth daily.  . cycloSPORINE (RESTASIS) 0.05 % ophthalmic emulsion Place  1 drop into both eyes 2 (two) times daily.   Marland Kitchen diltiazem (CARDIZEM CD) 120 MG 24 hr capsule Take 1 capsule (120 mg total) by mouth daily.  Marland Kitchen ezetimibe (ZETIA) 10 MG tablet TAKE 1 TABLET BY MOUTH ONCE A DAY.  . famotidine (PEPCID) 40 MG tablet Take 40 mg by mouth every evening.  . fluticasone (FLONASE) 50 MCG/ACT nasal spray PLACE 2 SPRAYS INTO BOTH NOSTRILS DAILY.  Marland Kitchen gabapentin (NEURONTIN) 300 MG capsule TAKE (1) CAPSULE BY MOUTH TWICE DAILY. (Patient taking differently: 3 (three) times daily. )  . insulin glargine (LANTUS SOLOSTAR) 100 UNIT/ML Solostar Pen Inject 6 Units into the skin at bedtime.   . insulin lispro (HUMALOG KWIKPEN) 100 UNIT/ML KwikPen insulin pen; 100 unit/mL; amt: Per Sliding Scale;  If Blood Sugar is 0 to 149, give 0 Units. If Blood Sugar is greater than 149, give 3 Units. subcutaneous Before Meals  . losartan (COZAAR) 25 MG tablet Take 1 tablet (25 mg total) by mouth daily.  . Multiple Vitamin (MULTIVITAMIN WITH MINERALS) TABS tablet Take 1 tablet by mouth at bedtime.  . NON FORMULARY Diet: _____ Regular, ______ NAS, _______Consistent Carbohydrate, _______NPO ___X__Other :SOFT DIET  . nystatin (NYSTATIN) powder Apply 1 application topically daily as needed. Special Instructions: fungal rash to groin and inguinal folds  . predniSONE (DELTASONE) 5 MG tablet Take 1 tablet (5 mg total) by mouth daily.  Marland Kitchen PROAIR HFA 108 (90 Base) MCG/ACT inhaler INHALE 2 PUFFS BY MOUTH EVERY 6 HOURS AS NEEDED .  Marland Kitchen Probiotic Product (RISA-BID PROBIOTIC) TABS Take 1 tablet by mouth 2 (two) times daily.  Marland Kitchen pyridostigmine (MESTINON) 60 MG tablet TAKE (1) TABLET BY MOUTH THREE TIMES A DAY.  . rosuvastatin (CRESTOR) 40 MG tablet TAKE (1) TABLET BY MOUTH AT BEDTIME.  Marland Kitchen solifenacin (VESICARE) 10 MG tablet TAKE 1 TABLET BY MOUTH ONCE A DAY.  Marland Kitchen venlafaxine XR (EFFEXOR-XR) 150 MG 24 hr capsule TAKE 1 CAPSULE BY MOUTH DAILY.  . fidaxomicin (DIFICID) 200 MG TABS tablet Take 1 tablet (200 mg total) by  mouth 2 (two) times daily.  . hydrocortisone (ANUSOL-HC) 2.5 % rectal cream Place 1 application rectally 2 (two) times daily.  Derrill Memo ON 07/11/2020] linagliptin (TRADJENTA) 5 MG TABS tablet Take 1 tablet (5 mg total) by mouth daily. (Patient not taking: Reported on 07/01/2020)  . ondansetron (ZOFRAN) 4 MG tablet Take 1 tablet (4 mg total) by mouth every 8 (eight) hours as needed for nausea or vomiting.   No facility-administered encounter medications on file as of 07/01/2020.     Allergies as of 07/01/2020  . (No Known Allergies)    Family History  Problem Relation Age of Onset  . Cancer Mother   . Heart disease Mother   . Cancer Father   . Heart disease Father   . Diabetes Sister   . Hypertension Brother   . Heart disease Brother   . Cancer Sister   . Kidney failure Sister   . Diabetes Son   . Hypertension Son   . Hypertension Daughter   . Colon cancer Neg Hx     Social History   Socioeconomic History  . Marital status: Single    Spouse name: Not on file  . Number of children: 2  . Years of education: Not on file  . Highest education level: Not on file  Occupational History  . Occupation: disabled    Employer: RETIRED  Tobacco Use  . Smoking status: Former Smoker    Packs/day: 0.50    Years: 25.00    Pack years: 12.50    Types: Cigarettes    Quit date: 01/01/2004    Years since quitting: 16.5  . Smokeless tobacco: Never Used  Vaping Use  . Vaping Use: Never used  Substance and Sexual Activity  . Alcohol use: No  . Drug use: No  . Sexual activity: Never    Birth control/protection: Surgical  Other Topics Concern  . Not on file  Social History Narrative   Patient lives at home by herself   Patient drinks one cup of coffee a day   Patient is right handed.   Social Determinants of Health   Financial Resource Strain:   . Difficulty of Paying Living Expenses:   Food Insecurity:   .  Worried About Charity fundraiser in the Last Year:   . Arboriculturist in  the Last Year:   Transportation Needs:   . Film/video editor (Medical):   Marland Kitchen Lack of Transportation (Non-Medical):   Physical Activity:   . Days of Exercise per Week:   . Minutes of Exercise per Session:   Stress:   . Feeling of Stress :   Social Connections:   . Frequency of Communication with Friends and Family:   . Frequency of Social Gatherings with Friends and Family:   . Attends Religious Services:   . Active Member of Clubs or Organizations:   . Attends Archivist Meetings:   Marland Kitchen Marital Status:     Review of Systems: Gen: Denies fever, chills, lightheadedness, presyncope, syncope. CV: Denies chest pain or palpitations.. Resp: Denies dyspnea or cough.  GI: See HPI. Heme: See HPI  Physical Exam: BP (!) 141/63   Pulse (!) 59   Temp (!) 97 F (36.1 C) (Oral)   Ht _0  (1.676 m)   BMI 38.80 kg/m  General:   Alert and oriented. No distress noted. Pleasant and cooperative. In a wheelchair. Head:  Normocephalic and atraumatic. Eyes:  Conjuctiva clear without scleral icterus. Heart:  S1, S2 present without murmurs appreciated. Lungs:  Clear to auscultation bilaterally. No wheezes, rales, or rhonchi. No distress.  Abdomen:  +BS in all four quadrants, soft, and non-distended. Mild TTP in LLQ, LUQ, and epigastric area. No right sided TTP. No rebound or guarding. No HSM or masses noted. Msk:  Symmetrical without gross deformities.  Extremities:  With 2+ bilateral lower extremity pitting edema up to knees. Neurologic:  Alert and  oriented x4 Psych:  Normal mood and affect.

## 2020-07-01 ENCOUNTER — Encounter: Payer: Self-pay | Admitting: Gastroenterology

## 2020-07-01 ENCOUNTER — Ambulatory Visit: Payer: Medicare Other | Admitting: Gastroenterology

## 2020-07-01 VITALS — BP 141/63 | HR 59 | Temp 97.0°F | Ht 66.0 in

## 2020-07-01 DIAGNOSIS — R933 Abnormal findings on diagnostic imaging of other parts of digestive tract: Secondary | ICD-10-CM

## 2020-07-01 DIAGNOSIS — D500 Iron deficiency anemia secondary to blood loss (chronic): Secondary | ICD-10-CM

## 2020-07-01 DIAGNOSIS — R110 Nausea: Secondary | ICD-10-CM | POA: Diagnosis not present

## 2020-07-01 DIAGNOSIS — K625 Hemorrhage of anus and rectum: Secondary | ICD-10-CM

## 2020-07-01 DIAGNOSIS — A0472 Enterocolitis due to Clostridium difficile, not specified as recurrent: Secondary | ICD-10-CM | POA: Diagnosis not present

## 2020-07-01 MED ORDER — DIFICID 200 MG PO TABS: 200.0000 mg | ORAL_TABLET | Freq: Two times a day (BID) | ORAL | 0 refills | Status: DC

## 2020-07-01 MED ORDER — HYDROCORTISONE (PERIANAL) 2.5 % EX CREA: 1.0000 "application " | TOPICAL_CREAM | Freq: Two times a day (BID) | CUTANEOUS | 2 refills | Status: AC

## 2020-07-01 MED ORDER — ONDANSETRON HCL 4 MG PO TABS: 4.0000 mg | ORAL_TABLET | Freq: Three times a day (TID) | ORAL | 0 refills | Status: AC | PRN

## 2020-07-01 NOTE — Patient Instructions (Addendum)
Please have labs completed at [facility].  We will plan to treat your C. difficile with Dificid 200 mg twice daily for 10 days.  I am sending in Anusol cream to apply twice daily for your hemorrhoids.  I am also sending in Zofran 4 mg every 8 hours as needed for nausea.  Please monitor your symptoms and let me know immediately of any worsening abdominal pain.  Please call with a progress report in 1 week regarding C. difficile/diarrhea.  We will plan to see you back in 4-6 weeks.  Please call with questions or concerns prior.  Aliene Altes, PA-C [facility]

## 2020-07-02 ENCOUNTER — Encounter: Payer: Self-pay | Admitting: Gastroenterology

## 2020-07-02 DIAGNOSIS — R933 Abnormal findings on diagnostic imaging of other parts of digestive tract: Secondary | ICD-10-CM | POA: Insufficient documentation

## 2020-07-02 NOTE — Assessment & Plan Note (Addendum)
79 year old diagnosed with C. difficile colitis during hospitalization from 06/11/2020-06/20/2020. This was complicated by AKI and hypokalemia. She was treated with vancomycin 125 mg QID 6/17-6/28. While on vancomycin, patient reports some improvement in stool consistency and stool frequency but diarrhea did not resolve. She continued to do okay for 2 days off vancomycin but had return of 10+ watery BMs daily on 06/29/2020. Continues with left lower quadrant pain present since diagnosis without significant change. Not worsening. Also with mild nausea without vomiting. Denies fever. CT A/P without contrast while inpatient revealed diffuse circumferential wall thickening of much of the descending and sigmoid colon with underlying mass not excluded.  Abdominal exam today with mild tenderness to palpation in the LLQ, LUQ, and epigastric area. No significant right sided pain. Bowel sounds present in all 4 quadrants. Notably, patient is on prednisone 5 mg chronically for history of myasthenia gravis that is in remission.  Due to persistent C. difficile infection, will try treating patient with Dificid 200 mg p.o. twice daily x10 days. Suspect difficulty treating C. difficile may be secondary to prednisone.  Update CBC and BMP. Zofran 4 mg every 8 hours as needed. Advised patient to monitor for any worsening abdominal pain and let us know immediately. Requested progress report in 1 week. She will need colonoscopy in the future once over acute illness to evaluate CT abnormality and rule out underlying mass. Follow-up in 4-6 weeks.

## 2020-07-02 NOTE — Assessment & Plan Note (Addendum)
Patient reports small amount of bright red blood in the toilet water with every bowel movement. This has been present for several months with known history of internal and external hemorrhoids suspected to be likely source of bleeding. There had been plans for hemorrhoid banding in April 2021, but due to weakness at office visit, patient was directed to the emergency room. Patient is now with persistent C. difficile colitis. CT without contrast 06/12/2020 during hospitalization for C. difficile revealed diffuse circumferential wall thickening of much of the descending colon and sigmoid colon stating underlying mass cannot be excluded with recommendations for follow-up colonoscopy. Last colonoscopy in 2013 with internal and external hemorrhoids and colonic diverticulosis. Notably during hospitalization, hemoglobin trended down from 11.2-7.5 on day of discharge with differential of acute blood loss versus hemodilution. No repeat CBC since discharge on 06/21/2019.  Rectal bleeding may very well be from known hemorrhoids; however, with abnormality on CT, patient will need colonoscopy once over acute illness. Will provide prescription for Anusol cream twice daily for hemorrhoids. See C. difficile colitis below for further management of acute illness. Follow-up in 4-6 weeks.

## 2020-07-02 NOTE — Assessment & Plan Note (Signed)
CT A/P without contrast 06/12/2020 with diffuse circumferential wall thickening of much of the descending colon and sigmoid colon with inability to exclude underlying mass. This is in the setting of C. difficile colitis which is addressed above. Patient will need colonoscopy at some point in the future once over acute illness to evaluate CT abnormalities.

## 2020-07-02 NOTE — Assessment & Plan Note (Signed)
Nausea without vomiting associated with C. difficile colitis. Will provide Zofran 4 mg every 8 hours as needed for symptomatic relief.

## 2020-07-02 NOTE — Assessment & Plan Note (Addendum)
History of iron deficiency anemia thought to be secondary to small bowel AVMs and erosions. Prior evaluation withEGD in Dec 2019 with mild Schatzki ring s/p dilation, small hiatal hernia, otherwise normal; colonoscopy Feb 2020 with two simple adenomas, diverticulosis, and hemorrhoids; capsule study September 2020 with small bowel AVMs and erosions. She has been following with hematology. Notably, during recent hospitalization 06/11/2020-06/20/2020 for C. difficile colitis, AKI, and hypokalemia, patient had decline in hemoglobin from 11.2 on admission to 7.5 on day of discharge. Differentials included acute blood loss versus hemodilution. Patient does report low-volume BRBPR with BMs that has been present for several months and had been heavier initially (prior to C. difficile). No NSAIDs. Known history of external and internal hemorrhoids on colonoscopy in 2013. There have been plans for hemorrhoid banding in April 2021, but due to weakness, patient was routed to the emergency room. No repeat CBC since hospitalization. Notably, CT A/P without contrast while hospitalized revealed circumferential wall thickening of much of the descending and sigmoid colon with inability to exclude underlying mass.  Update CBC. Anusol cream twice daily for symptomatic management of hemorrhoids. Patient will need colonoscopy once over acute illness to further evaluate CT abnormalities. C. difficile addressed above. Follow-up in 4-6 weeks.

## 2020-07-03 ENCOUNTER — Other Ambulatory Visit: Payer: Self-pay

## 2020-07-03 ENCOUNTER — Encounter (HOSPITAL_COMMUNITY)
Admission: RE | Admit: 2020-07-03 | Discharge: 2020-07-03 | Disposition: A | Discharge status: Home / Self Care | Payer: Medicare Other | Source: Skilled Nursing Facility | Attending: Adult Health | Admitting: Adult Health

## 2020-07-03 ENCOUNTER — Encounter (HOSPITAL_COMMUNITY): Payer: Self-pay

## 2020-07-03 ENCOUNTER — Inpatient Hospital Stay (HOSPITAL_COMMUNITY)
Admission: EM | Admit: 2020-07-03 | Discharge: 2020-07-07 | DRG: 371 | Disposition: A | Discharge status: Skilled Nursing Facility | Payer: Medicare Other | Source: Skilled Nursing Facility | Attending: Internal Medicine | Admitting: Internal Medicine

## 2020-07-03 DIAGNOSIS — K5731 Diverticulosis of large intestine without perforation or abscess with bleeding: Secondary | ICD-10-CM | POA: Diagnosis not present

## 2020-07-03 DIAGNOSIS — A0471 Enterocolitis due to Clostridium difficile, recurrent: Principal | ICD-10-CM | POA: Diagnosis present

## 2020-07-03 DIAGNOSIS — R262 Difficulty in walking, not elsewhere classified: Secondary | ICD-10-CM | POA: Diagnosis not present

## 2020-07-03 DIAGNOSIS — K5521 Angiodysplasia of colon with hemorrhage: Secondary | ICD-10-CM | POA: Diagnosis present

## 2020-07-03 DIAGNOSIS — D62 Acute posthemorrhagic anemia: Secondary | ICD-10-CM | POA: Diagnosis present

## 2020-07-03 DIAGNOSIS — E039 Hypothyroidism, unspecified: Secondary | ICD-10-CM | POA: Diagnosis not present

## 2020-07-03 DIAGNOSIS — H40053 Ocular hypertension, bilateral: Secondary | ICD-10-CM | POA: Diagnosis not present

## 2020-07-03 DIAGNOSIS — Z809 Family history of malignant neoplasm, unspecified: Secondary | ICD-10-CM

## 2020-07-03 DIAGNOSIS — K922 Gastrointestinal hemorrhage, unspecified: Secondary | ICD-10-CM | POA: Diagnosis not present

## 2020-07-03 DIAGNOSIS — M6281 Muscle weakness (generalized): Secondary | ICD-10-CM | POA: Diagnosis not present

## 2020-07-03 DIAGNOSIS — E1140 Type 2 diabetes mellitus with diabetic neuropathy, unspecified: Secondary | ICD-10-CM | POA: Diagnosis not present

## 2020-07-03 DIAGNOSIS — G7000 Myasthenia gravis without (acute) exacerbation: Secondary | ICD-10-CM | POA: Diagnosis present

## 2020-07-03 DIAGNOSIS — N183 Chronic kidney disease, stage 3 unspecified: Secondary | ICD-10-CM | POA: Diagnosis present

## 2020-07-03 DIAGNOSIS — I129 Hypertensive chronic kidney disease with stage 1 through stage 4 chronic kidney disease, or unspecified chronic kidney disease: Secondary | ICD-10-CM | POA: Diagnosis present

## 2020-07-03 DIAGNOSIS — E119 Type 2 diabetes mellitus without complications: Secondary | ICD-10-CM

## 2020-07-03 DIAGNOSIS — Z841 Family history of disorders of kidney and ureter: Secondary | ICD-10-CM | POA: Diagnosis not present

## 2020-07-03 DIAGNOSIS — Z20822 Contact with and (suspected) exposure to covid-19: Secondary | ICD-10-CM | POA: Diagnosis present

## 2020-07-03 DIAGNOSIS — N83201 Unspecified ovarian cyst, right side: Secondary | ICD-10-CM | POA: Diagnosis present

## 2020-07-03 DIAGNOSIS — Z87891 Personal history of nicotine dependence: Secondary | ICD-10-CM

## 2020-07-03 DIAGNOSIS — J3089 Other allergic rhinitis: Secondary | ICD-10-CM | POA: Diagnosis not present

## 2020-07-03 DIAGNOSIS — E1122 Type 2 diabetes mellitus with diabetic chronic kidney disease: Secondary | ICD-10-CM | POA: Diagnosis present

## 2020-07-03 DIAGNOSIS — A0472 Enterocolitis due to Clostridium difficile, not specified as recurrent: Secondary | ICD-10-CM

## 2020-07-03 DIAGNOSIS — K219 Gastro-esophageal reflux disease without esophagitis: Secondary | ICD-10-CM | POA: Diagnosis present

## 2020-07-03 DIAGNOSIS — E785 Hyperlipidemia, unspecified: Secondary | ICD-10-CM | POA: Diagnosis not present

## 2020-07-03 DIAGNOSIS — Z8249 Family history of ischemic heart disease and other diseases of the circulatory system: Secondary | ICD-10-CM | POA: Diagnosis not present

## 2020-07-03 DIAGNOSIS — Z833 Family history of diabetes mellitus: Secondary | ICD-10-CM | POA: Diagnosis not present

## 2020-07-03 DIAGNOSIS — J449 Chronic obstructive pulmonary disease, unspecified: Secondary | ICD-10-CM | POA: Diagnosis not present

## 2020-07-03 DIAGNOSIS — Z96651 Presence of right artificial knee joint: Secondary | ICD-10-CM | POA: Diagnosis not present

## 2020-07-03 DIAGNOSIS — Z794 Long term (current) use of insulin: Secondary | ICD-10-CM | POA: Diagnosis not present

## 2020-07-03 DIAGNOSIS — K2100 Gastro-esophageal reflux disease with esophagitis, without bleeding: Secondary | ICD-10-CM | POA: Diagnosis not present

## 2020-07-03 DIAGNOSIS — R0602 Shortness of breath: Secondary | ICD-10-CM | POA: Diagnosis not present

## 2020-07-03 DIAGNOSIS — D649 Anemia, unspecified: Secondary | ICD-10-CM | POA: Diagnosis not present

## 2020-07-03 DIAGNOSIS — K222 Esophageal obstruction: Secondary | ICD-10-CM | POA: Diagnosis not present

## 2020-07-03 DIAGNOSIS — G4733 Obstructive sleep apnea (adult) (pediatric): Secondary | ICD-10-CM | POA: Diagnosis present

## 2020-07-03 DIAGNOSIS — R1909 Other intra-abdominal and pelvic swelling, mass and lump: Secondary | ICD-10-CM | POA: Diagnosis not present

## 2020-07-03 DIAGNOSIS — Z741 Need for assistance with personal care: Secondary | ICD-10-CM | POA: Diagnosis not present

## 2020-07-03 DIAGNOSIS — K5790 Diverticulosis of intestine, part unspecified, without perforation or abscess without bleeding: Secondary | ICD-10-CM | POA: Diagnosis not present

## 2020-07-03 DIAGNOSIS — E1129 Type 2 diabetes mellitus with other diabetic kidney complication: Secondary | ICD-10-CM | POA: Diagnosis not present

## 2020-07-03 LAB — BASIC METABOLIC PANEL
Anion gap: 7 (ref 5–15)
BUN: 24 mg/dL — ABNORMAL HIGH (ref 8–23)
CO2: 25 mmol/L (ref 22–32)
Calcium: 7.8 mg/dL — ABNORMAL LOW (ref 8.9–10.3)
Chloride: 105 mmol/L (ref 98–111)
Creatinine, Ser: 0.78 mg/dL (ref 0.44–1.00)
GFR calc Af Amer: >60 mL/min (ref >60)
GFR calc non Af Amer: >60 mL/min (ref >60)
Glucose, Bld: 105 mg/dL — ABNORMAL HIGH (ref 70–99)
Potassium: 3.2 mmol/L — ABNORMAL LOW (ref 3.5–5.1)
Sodium: 137 mmol/L (ref 135–145)

## 2020-07-03 LAB — CBC WITH DIFFERENTIAL/PLATELET
Abs Immature Granulocytes: 0.11 10*3/uL — ABNORMAL HIGH (ref 0.00–0.07)
Basophils Absolute: 0 10*3/uL (ref 0.0–0.1)
Basophils Relative: 0 %
Eosinophils Absolute: 0.2 10*3/uL (ref 0.0–0.5)
Eosinophils Relative: 2 %
HCT: 20.1 % — ABNORMAL LOW (ref 36.0–46.0)
Hemoglobin: 5.9 g/dL — CL (ref 12.0–15.0)
Immature Granulocytes: 1 %
Lymphocytes Relative: 15 %
Lymphs Abs: 1.3 10*3/uL (ref 0.7–4.0)
MCH: 27.6 pg (ref 26.0–34.0)
MCHC: 29.4 g/dL — ABNORMAL LOW (ref 30.0–36.0)
MCV: 93.9 fL (ref 80.0–100.0)
Monocytes Absolute: 1 10*3/uL (ref 0.1–1.0)
Monocytes Relative: 11 %
Neutro Abs: 6.5 10*3/uL (ref 1.7–7.7)
Neutrophils Relative %: 71 %
Platelets: 481 10*3/uL — ABNORMAL HIGH (ref 150–400)
RBC: 2.14 MIL/uL — ABNORMAL LOW (ref 3.87–5.11)
RDW: 20.7 % — ABNORMAL HIGH (ref 11.5–15.5)
WBC: 9.2 10*3/uL (ref 4.0–10.5)
nRBC: 0.5 % — ABNORMAL HIGH (ref 0.0–0.2)

## 2020-07-03 LAB — COMPREHENSIVE METABOLIC PANEL
ALT: 69 U/L — ABNORMAL HIGH (ref 0–44)
AST: 53 U/L — ABNORMAL HIGH (ref 15–41)
Albumin: 1.7 g/dL — ABNORMAL LOW (ref 3.5–5.0)
Alkaline Phosphatase: 140 U/L — ABNORMAL HIGH (ref 38–126)
Anion gap: 7 (ref 5–15)
BUN: 25 mg/dL — ABNORMAL HIGH (ref 8–23)
CO2: 26 mmol/L (ref 22–32)
Calcium: 7.5 mg/dL — ABNORMAL LOW (ref 8.9–10.3)
Chloride: 104 mmol/L (ref 98–111)
Creatinine, Ser: 0.85 mg/dL (ref 0.44–1.00)
GFR calc Af Amer: >60 mL/min (ref >60)
GFR calc non Af Amer: >60 mL/min (ref >60)
Glucose, Bld: 203 mg/dL — ABNORMAL HIGH (ref 70–99)
Potassium: 3.3 mmol/L — ABNORMAL LOW (ref 3.5–5.1)
Sodium: 137 mmol/L (ref 135–145)
Total Bilirubin: 0.4 mg/dL (ref 0.3–1.2)
Total Protein: 5.7 g/dL — ABNORMAL LOW (ref 6.5–8.1)

## 2020-07-03 LAB — HEPATIC FUNCTION PANEL
ALT: 70 U/L — ABNORMAL HIGH (ref 0–44)
AST: 54 U/L — ABNORMAL HIGH (ref 15–41)
Albumin: 1.7 g/dL — ABNORMAL LOW (ref 3.5–5.0)
Alkaline Phosphatase: 138 U/L — ABNORMAL HIGH (ref 38–126)
Bilirubin, Direct: 0.1 mg/dL (ref 0.0–0.2)
Indirect Bilirubin: 0.4 mg/dL (ref 0.3–0.9)
Total Bilirubin: 0.5 mg/dL (ref 0.3–1.2)
Total Protein: 5.5 g/dL — ABNORMAL LOW (ref 6.5–8.1)

## 2020-07-03 LAB — CBC
HCT: 20.5 % — ABNORMAL LOW (ref 36.0–46.0)
Hemoglobin: 6.3 g/dL — CL (ref 12.0–15.0)
MCH: 27.9 pg (ref 26.0–34.0)
MCHC: 30.7 g/dL (ref 30.0–36.0)
MCV: 90.7 fL (ref 80.0–100.0)
Platelets: 491 10*3/uL — ABNORMAL HIGH (ref 150–400)
RBC: 2.26 MIL/uL — ABNORMAL LOW (ref 3.87–5.11)
RDW: 20.1 % — ABNORMAL HIGH (ref 11.5–15.5)
WBC: 10.9 10*3/uL — ABNORMAL HIGH (ref 4.0–10.5)
nRBC: 0.5 % — ABNORMAL HIGH (ref 0.0–0.2)

## 2020-07-03 LAB — PREPARE RBC (CROSSMATCH)

## 2020-07-03 LAB — PROTIME-INR
INR: 1.2 (ref 0.8–1.2)
Prothrombin Time: 15.1 seconds (ref 11.4–15.2)

## 2020-07-03 LAB — SARS CORONAVIRUS 2 BY RT PCR (HOSPITAL ORDER, PERFORMED IN ~~LOC~~ HOSPITAL LAB): SARS Coronavirus 2: NEGATIVE

## 2020-07-03 LAB — POC OCCULT BLOOD, ED: Fecal Occult Bld: POSITIVE — AB

## 2020-07-03 MED ORDER — INSULIN ASPART 100 UNIT/ML ~~LOC~~ SOLN
0.0000 [IU] | Freq: Three times a day (TID) | SUBCUTANEOUS | Status: DC
  Administered 2020-07-05: 7 [IU] via SUBCUTANEOUS

## 2020-07-03 MED ORDER — GABAPENTIN 300 MG PO CAPS
300.0000 mg | ORAL_CAPSULE | Freq: Three times a day (TID) | ORAL | Status: DC
  Administered 2020-07-03 – 2020-07-07 (×11): 300 mg via ORAL
  Filled 2020-07-03 (×11): qty 1

## 2020-07-03 MED ORDER — BRIMONIDINE TARTRATE-TIMOLOL 0.2-0.5 % OP SOLN
1.0000 [drp] | Freq: Two times a day (BID) | OPHTHALMIC | Status: DC
  Filled 2020-07-03: qty 5

## 2020-07-03 MED ORDER — FLUTICASONE PROPIONATE 50 MCG/ACT NA SUSP
2.0000 | Freq: Every day | NASAL | Status: DC
  Administered 2020-07-04 – 2020-07-07 (×4): 2 via NASAL
  Filled 2020-07-03: qty 16

## 2020-07-03 MED ORDER — ALBUTEROL SULFATE (2.5 MG/3ML) 0.083% IN NEBU: 3.0000 mL | INHALATION_SOLUTION | Freq: Four times a day (QID) | RESPIRATORY_TRACT | Status: DC | PRN

## 2020-07-03 MED ORDER — INSULIN GLARGINE 100 UNIT/ML ~~LOC~~ SOLN
6.0000 [IU] | Freq: Every day | SUBCUTANEOUS | Status: DC
  Administered 2020-07-04 – 2020-07-06 (×3): 6 [IU] via SUBCUTANEOUS
  Filled 2020-07-03 (×5): qty 0.06

## 2020-07-03 MED ORDER — PREDNISONE 10 MG PO TABS
5.0000 mg | ORAL_TABLET | Freq: Every day | ORAL | Status: DC
  Administered 2020-07-04 – 2020-07-07 (×4): 5 mg via ORAL
  Filled 2020-07-03 (×4): qty 1

## 2020-07-03 MED ORDER — HYDROCORTISONE (PERIANAL) 2.5 % EX CREA
1.0000 "application " | TOPICAL_CREAM | Freq: Two times a day (BID) | CUTANEOUS | Status: DC
  Administered 2020-07-04 – 2020-07-07 (×7): 1 via RECTAL
  Filled 2020-07-03 (×2): qty 28.35

## 2020-07-03 MED ORDER — PYRIDOSTIGMINE BROMIDE 60 MG PO TABS
60.0000 mg | ORAL_TABLET | Freq: Three times a day (TID) | ORAL | Status: DC
  Administered 2020-07-03 – 2020-07-07 (×11): 60 mg via ORAL
  Filled 2020-07-03 (×12): qty 1

## 2020-07-03 MED ORDER — PRO-STAT SUGAR FREE PO LIQD
30.0000 mL | Freq: Three times a day (TID) | ORAL | Status: DC
  Administered 2020-07-04 – 2020-07-07 (×10): 30 mL via ORAL
  Filled 2020-07-03 (×9): qty 30

## 2020-07-03 MED ORDER — SODIUM CHLORIDE 0.9 % IV SOLN: INTRAVENOUS | Status: DC

## 2020-07-03 MED ORDER — DARIFENACIN HYDROBROMIDE ER 7.5 MG PO TB24
7.5000 mg | ORAL_TABLET | Freq: Every day | ORAL | Status: DC
  Administered 2020-07-04 – 2020-07-07 (×4): 7.5 mg via ORAL
  Filled 2020-07-03 (×4): qty 1

## 2020-07-03 MED ORDER — SODIUM CHLORIDE 0.9 % IV BOLUS
500.0000 mL | Freq: Once | INTRAVENOUS | Status: AC
  Administered 2020-07-03: 500 mL via INTRAVENOUS

## 2020-07-03 MED ORDER — VITAMIN D 25 MCG (1000 UNIT) PO TABS
2000.0000 [IU] | ORAL_TABLET | Freq: Every day | ORAL | Status: DC
  Administered 2020-07-03 – 2020-07-06 (×4): 2000 [IU] via ORAL
  Filled 2020-07-03 (×4): qty 2

## 2020-07-03 MED ORDER — ONDANSETRON HCL 4 MG PO TABS: 4.0000 mg | ORAL_TABLET | Freq: Three times a day (TID) | ORAL | Status: DC | PRN

## 2020-07-03 MED ORDER — VENLAFAXINE HCL ER 75 MG PO CP24
150.0000 mg | ORAL_CAPSULE | Freq: Every day | ORAL | Status: DC
  Administered 2020-07-04 – 2020-07-07 (×4): 150 mg via ORAL
  Filled 2020-07-03 (×4): qty 2

## 2020-07-03 MED ORDER — FIDAXOMICIN 200 MG PO TABS
200.0000 mg | ORAL_TABLET | Freq: Two times a day (BID) | ORAL | Status: DC
  Administered 2020-07-03 – 2020-07-07 (×8): 200 mg via ORAL
  Filled 2020-07-03 (×10): qty 1

## 2020-07-03 MED ORDER — EZETIMIBE 10 MG PO TABS
10.0000 mg | ORAL_TABLET | Freq: Every day | ORAL | Status: DC
  Administered 2020-07-04 – 2020-07-07 (×4): 10 mg via ORAL
  Filled 2020-07-03 (×4): qty 1

## 2020-07-03 MED ORDER — CYCLOSPORINE 0.05 % OP EMUL
1.0000 [drp] | Freq: Two times a day (BID) | OPHTHALMIC | Status: DC
  Administered 2020-07-03 – 2020-07-07 (×8): 1 [drp] via OPHTHALMIC
  Filled 2020-07-03 (×8): qty 1

## 2020-07-03 MED ORDER — LOSARTAN POTASSIUM 50 MG PO TABS
25.0000 mg | ORAL_TABLET | Freq: Every day | ORAL | Status: DC
  Administered 2020-07-04 – 2020-07-07 (×4): 25 mg via ORAL
  Filled 2020-07-03 (×4): qty 1

## 2020-07-03 MED ORDER — CITALOPRAM HYDROBROMIDE 10 MG PO TABS
10.0000 mg | ORAL_TABLET | Freq: Every day | ORAL | Status: DC
  Administered 2020-07-04 – 2020-07-07 (×4): 10 mg via ORAL
  Filled 2020-07-03 (×4): qty 1

## 2020-07-03 MED ORDER — SODIUM CHLORIDE 0.9 % IV SOLN
10.0000 mL/h | Freq: Once | INTRAVENOUS | Status: AC
  Administered 2020-07-03: 10 mL/h via INTRAVENOUS

## 2020-07-03 MED ORDER — ACETAMINOPHEN 325 MG PO TABS: 650.0000 mg | ORAL_TABLET | Freq: Four times a day (QID) | ORAL | Status: DC | PRN

## 2020-07-03 MED ORDER — DILTIAZEM HCL ER COATED BEADS 120 MG PO CP24
120.0000 mg | ORAL_CAPSULE | Freq: Every day | ORAL | Status: DC
  Administered 2020-07-04 – 2020-07-07 (×4): 120 mg via ORAL
  Filled 2020-07-03 (×4): qty 1

## 2020-07-03 MED ORDER — HYDROCODONE-ACETAMINOPHEN 10-325 MG PO TABS
1.0000 | ORAL_TABLET | Freq: Two times a day (BID) | ORAL | Status: DC | PRN
  Administered 2020-07-05: 1 via ORAL
  Filled 2020-07-03: qty 1

## 2020-07-03 MED ORDER — ROSUVASTATIN CALCIUM 20 MG PO TABS
40.0000 mg | ORAL_TABLET | Freq: Every day | ORAL | Status: DC
  Administered 2020-07-04 – 2020-07-06 (×3): 40 mg via ORAL
  Filled 2020-07-03 (×4): qty 2

## 2020-07-03 MED ORDER — NYSTATIN 100000 UNIT/GM EX POWD
1.0000 "application " | Freq: Two times a day (BID) | CUTANEOUS | Status: AC
  Administered 2020-07-04 – 2020-07-06 (×5): 1 via TOPICAL
  Filled 2020-07-03: qty 15

## 2020-07-03 MED ORDER — LORATADINE 10 MG PO TABS
10.0000 mg | ORAL_TABLET | Freq: Every day | ORAL | Status: DC
  Administered 2020-07-04 – 2020-07-07 (×4): 10 mg via ORAL
  Filled 2020-07-03 (×4): qty 1

## 2020-07-03 NOTE — ED Notes (Signed)
Report to Saralyn Pilar, RN

## 2020-07-03 NOTE — Progress Notes (Signed)
Patient refused CPAP, states she does not wear CPAP at [facility].

## 2020-07-03 NOTE — ED Notes (Signed)
Date and time results received: 07/03/20 1503 (use smartphrase ".now" to insert current time)  Test: HGB Critical Value: 5.9  Name of Provider Notified: Dr Roderic Palau Orders Received? Or Actions Taken?:NA

## 2020-07-03 NOTE — ED Triage Notes (Addendum)
Pt brought over from [facility] center due to hemoglobin 6.3. Also reported pt is being treated for c diff .Review of meds show discontinued for cdiff 0n 06/27/20

## 2020-07-03 NOTE — ED Provider Notes (Signed)
[facility] EMERGENCY DEPARTMENT Provider Note   CSN: [id number] Arrival date & time: 07/03/20  1331     History Chief Complaint  Patient presents with  . Abnormal Lab    Jenna ARRAZOLA is a 79 y.o. female with a history of constipation, hemorrhoids, small bowel AVMs and erosions, present emergency department with symptomatic anemia and blood in stool.  Patient was referred into the emergency center by [facility] today after being noted to be anemic on outpatient testing with a hemoglobin 6.3.  Patient ports that she has had multiple days of both bright red blood and dark, bloody looking stool.  She reports that she is chronically fatigued and does not ambulate much.  She says that she always feels lightheaded and dizzy.  She denies any hematemesis.  She states she only takes a baby aspirin but no other blood thinners.  She was just discharged from the hospital on 6/21 after admission for acute C. difficile colitis.  She follows with Dr. Gala Romney of GI.  Last colonoscopy was 02/26/19 noting internal and external hemorrhoids, significant diverticulosis, and 2 polyps removed.  Last EGD w/ schitazki ring dilated and small hiatal hernia noted on 12/08/2018.    HPI     Past Medical History:  Diagnosis Date  . Anemia in chronic renal disease 12/30/2015  . Anxiety   . Arthritis    "all over" (01/19/2014)  . Arthritis, lumbar spine   . Carpal tunnel syndrome   . Chronic bronchitis ([place])    "got it q yr for awhile; hasn't had it in awhile" (01/19/2014)  . Chronic kidney disease (CKD) stage G3b/A1, moderately decreased glomerular filtration rate (GFR) between 30-44 mL/min/1.73 square meter and albuminuria creatinine ratio less than 30 mg/g 09/14/2009   Qualifier: Diagnosis of  By: Moshe Cipro MD, Joycelyn Schmid    . Chronic renal disease, stage 3, moderately decreased glomerular filtration rate (GFR) between 30-59 mL/min/1.73 square meter 09/14/2009   Qualifier: Diagnosis of  By: Moshe Cipro MD, Joycelyn Schmid     . Complication of anesthesia    combative  . COPD (chronic obstructive pulmonary disease) ([place])   . Depression   . Diverticulosis 07/2004   Colonscopy Dr Gala Romney  . Esophagitis, erosive 2009  . Exertional shortness of breath   . Gastroesophageal reflux   . WERXVQMG(867.6)    "usually a couple times/wk" (01/19/2014)  . Heart murmur    saw cardiology In [place], he told her she did not need to come back.  . Hyperlipidemia   . Hypertension   . Impingement syndrome, shoulder   . Iron deficiency anemia   . Mitral regurgitation   . Myasthenia gravis   . Myasthenia gravis in remission ([place]) 11/24/2014  . Myasthenia gravis in remission ([place])   . Obesity   . OSA on CPAP    Negative on last sleep study  . Pneumonia 01/2012  . Pulmonary HTN ([place])   . Schatzki's ring    Last EGD w/ dilation 02/08/11, 2009 & 2007  . Seasonal allergies   . Shoulder pain   . Thyroid disease    "used to take RX; they took me off it" (01/19/2014)  . Tobacco abuse   . Type II diabetes mellitus [facility])     Patient Active Problem List   Diagnosis Date Noted  . GI bleed 07/03/2020  . Abnormal CT scan, colon 07/02/2020  . Normochromic normocytic anemia 06/22/2020  . Hypertension associated with stage 3 chronic kidney disease due to type 2 diabetes mellitus ([place]) 06/21/2020  .  Chronic non-seasonal allergic rhinitis 06/21/2020  . Type 2 diabetes mellitus with diabetic neuropathy, unspecified ([place]) 06/21/2020  . Hyperlipidemia associated with type 2 diabetes mellitus ([place]) 06/21/2020  . Diabetic peripheral neuropathy associated with type 2 diabetes mellitus ([place]) 06/21/2020  . Urinary urgency 06/21/2020  . Pelvic mass in female 06/21/2020  . Acute blood loss anemia 06/21/2020  . COPD (chronic obstructive pulmonary disease) ([place]) 06/21/2020  . Increased intraocular pressure, bilateral 06/21/2020  . C. difficile colitis 06/18/2020  . Acute colitis 06/12/2020  . Hypokalemia 06/12/2020  . AKI (acute kidney injury)  ([place]) 06/12/2020  . Hypomagnesemia 06/12/2020  . Colitis 06/12/2020  . Osteoarthritis of both knees 05/24/2020  . Elevated alkaline phosphatase measurement 10/21/2019  . Back pain 07/05/2019  . Rectal bleeding 01/08/2019  . Chronic constipation 01/08/2019  . Nausea without vomiting 11/03/2018  . Trigger finger, right index finger   . Monoclonal gammopathy of unknown significance (MGUS) 11/26/2017  . Memory loss of unknown cause 11/10/2017  . Chronic left shoulder pain 09/14/2017  . Encounter for pain management 02/09/2017  . Acute maxillary sinusitis 04/11/2016  . Anemia in chronic renal disease 12/30/2015  . Morbid obesity ([place]) 04/18/2015  . Myasthenia gravis in remission ([place]) 11/24/2014  . Myasthenia gravis ([place]) 11/16/2014  . Vitamin D deficiency 05/24/2014  . Osteopenia 04/26/2014  . Macular hole of left eye 01/19/2014  . Generalized osteoarthritis 05/25/2013  . Esophageal dysphagia 04/03/2012  . Abnormal chest CT 03/02/2012  . Insomnia 01/02/2012  . Malignant hypertension 05/30/2011  . Schatzki's ring 05/07/2011  . INGROWN TOENAIL 01/10/2011  . Unspecified glaucoma 08/08/2010  . DYSCHROMIA, UNSPECIFIED 08/08/2010  . CKD stage 3 due to type 2 diabetes mellitus ([place]) 09/14/2009  . HIP PAIN, LEFT 06/09/2009  . Urinary incontinence 06/09/2009  . IDA (iron deficiency anemia) 10/02/2008  . Hypothyroidism 09/27/2008  . Hyperlipemia 06/30/2008  . Depression with anxiety 06/30/2008  . GERD 06/30/2008  . Obstructive sleep apnea 06/30/2008  . CARPAL TUNNEL SYNDROME 05/19/2008  . SHOULDER PAIN 02/02/2008  . IMPINGEMENT SYNDROME 02/02/2008  . DEGENERATIVE JOINT DISEASE, KNEE 11/24/2007  . Type 2 diabetes mellitus ([place]) 09/09/2007    Past Surgical History:  Procedure Laterality Date  . [place] VITRECTOMY WITH 20 GAUGE MVR PORT Left 01/19/2014   Procedure: 25 GAUGE PARS PLANA VITRECTOMY WITH 20 GAUGE MVR PORT; MEMBRAME PEEL; SERUM PATCH; LASER TREATMENT; C3F8;   Surgeon: Hayden Pedro, MD;  Location: [facility];  Service: Ophthalmology;  Laterality: Left;  . ABDOMINAL HYSTERECTOMY    . AGILE CAPSULE N/A 08/05/2019   Procedure: AGILE CAPSULE;  Surgeon: Daneil Dolin, MD;  [facility];  Service: Endoscopy;  Laterality: N/A;  7:30am  . APPENDECTOMY    . CARPAL TUNNEL RELEASE Bilateral   . CATARACT EXTRACTION W/PHACO Left 10/21/2013   Procedure: LEFT CATARACT EXTRACTION PHACO AND INTRAOCULAR LENS PLACEMENT (IOC);  Surgeon: Marylynn Pearson, MD;  Location: [facility];  Service: Ophthalmology;  Laterality: Left;  . CHOLECYSTECTOMY    . COLONOSCOPY  05/08/2012   JQB:HALPFXTK and external hemorrhoids; colonic diverticulosis  . COLONOSCOPY WITH PROPOFOL N/A 02/26/2019   two simple adenomas, diverticulosis,  and internal hemorrhoids.   . ESOPHAGEAL DILATION     "more than 3 times" (01/19/2014)  . ESOPHAGOGASTRODUODENOSCOPY  02/08/11   [place]-Distal esophageal erosion consistent with mild erosive reflux   esophagitis/ Noncritical Schatzki ring, small hiatal hernia otherwise upper/ gastrointestinal tract appeared unremarkable, status post passage  of a Maloney dilation to biopsy disruption of the  ring described  . ESOPHAGOGASTRODUODENOSCOPY  04/2012   2 tandem incomplete distal esophagea rings s/p dilation.   . ESOPHAGOGASTRODUODENOSCOPY N/A 09/16/2014   Dr. Gala Romney: Schatzki ring status post dilation/disruption.  Hiatal hernia.  Marland Kitchen ESOPHAGOGASTRODUODENOSCOPY (EGD) WITH PROPOFOL N/A 12/08/2018   EGD with mild Schatzki ring s/p dilation, small hiatal hernia, otherwise normal  . EYE SURGERY     cataracts, bilateral  . GIVENS CAPSULE STUDY N/A 09/29/2019   Procedure: GIVENS CAPSULE STUDY;  Surgeon: Daneil Dolin, MD;  [facility];  Service: Endoscopy;  Laterality: N/A;  7:30am  . INCONTINENCE SURGERY  08/26/09   [place]  . JOINT REPLACEMENT     right total knee  . MALONEY DILATION N/A 09/16/2014   Procedure: Venia Minks DILATION;  Surgeon: Daneil Dolin, MD;   [facility];  Service: Endoscopy;  Laterality: N/A;  . Venia Minks DILATION N/A 12/08/2018   Procedure: Venia Minks DILATION;  Surgeon: Daneil Dolin, MD;  [facility];  Service: Endoscopy;  Laterality: N/A;  . PARS PLANA VITRECTOMY W/ REPAIR OF MACULAR HOLE Left 01/19/2014  . POLYPECTOMY  02/26/2019   Procedure: POLYPECTOMY;  Surgeon: Danie Binder, MD;  [facility];  Service: Endoscopy;;  ascending colon polyps x2  . SAVORY DILATION N/A 09/16/2014   Procedure: SAVORY DILATION;  Surgeon: Daneil Dolin, MD;  [facility];  Service: Endoscopy;  Laterality: N/A;  . TONSILLECTOMY    . TOTAL KNEE ARTHROPLASTY Right 05/13/07   Dr. Aline Brochure  . TRIGGER FINGER RELEASE Right 06/26/2018   Procedure: RIGHT INDEX FINGER TRIGGER FINGER/A-1 PULLEY RELEASE;  Surgeon: Carole Civil, MD;  Location: [facility];  Service: Orthopedics;  Laterality: Right;     OB History   No obstetric history on file.     Family History  Problem Relation Age of Onset  . Cancer Mother   . Heart disease Mother   . Cancer Father   . Heart disease Father   . Diabetes Sister   . Hypertension Brother   . Heart disease Brother   . Cancer Sister   . Kidney failure Sister   . Diabetes Son   . Hypertension Son   . Hypertension Daughter   . Colon cancer Neg Hx     Social History   Tobacco Use  . Smoking status: Former Smoker    Packs/day: 0.50    Years: 25.00    Pack years: 12.50    Types: Cigarettes    Quit date: 01/01/2004    Years since quitting: 16.5  . Smokeless tobacco: Never Used  Vaping Use  . Vaping Use: Never used  Substance Use Topics  . Alcohol use: No  . Drug use: No    Home Medications Prior to Admission medications   Medication Sig Start Date End Date Taking? Authorizing Provider  acetaminophen (TYLENOL) 325 MG tablet Take 2 tablets (650 mg total) by mouth every 6 (six) hours as needed for mild pain, fever or headache (or Fever >/= 101). 06/20/20  Yes  Emokpae, Courage, MD  Amino Acids-Protein Hydrolys (FEEDING SUPPLEMENT, PRO-STAT SUGAR FREE 64,) LIQD Take 30 mLs by mouth 3 (three) times daily with meals. 06/21/20  Yes [provider]  Janne Lab Oil (VENELEX) OINT Apply topically in the morning, at noon, and at bedtime. ointment; - ; amt: ribbon; topical 06/20/20  Yes [provider]  brimonidine-timolol (COMBIGAN) 0.2-0.5 % ophthalmic solution Place 1 drop into both eyes every 12 (twelve) hours.   Yes  [provider]  cetirizine (ZYRTEC) 10 MG tablet TAKE 1 TABLET BY MOUTH ONCE A DAY. 05/17/20  Yes Fayrene Helper, MD  Cholecalciferol (VITAMIN D3) 2000 units TABS Take 2,000 Units by mouth at bedtime.   Yes [provider]  citalopram (CELEXA) 10 MG tablet TAKE 1 TABLET BY MOUTH ONCE A DAY. 04/14/20  Yes Fayrene Helper, MD  Cranberry 1000 MG CAPS Take 1,000 mg by mouth daily.   Yes [provider]  cycloSPORINE (RESTASIS) 0.05 % ophthalmic emulsion Place 1 drop into both eyes 2 (two) times daily.    Yes [provider]  diltiazem (CARDIZEM CD) 120 MG 24 hr capsule Take 1 capsule (120 mg total) by mouth daily. 06/20/20 06/20/21 Yes Emokpae, Courage, MD  ezetimibe (ZETIA) 10 MG tablet TAKE 1 TABLET BY MOUTH ONCE A DAY. 05/17/20  Yes Fayrene Helper, MD  famotidine (PEPCID) 40 MG tablet Take 40 mg by mouth every evening. 06/21/20  Yes [provider]  fidaxomicin (DIFICID) 200 MG TABS tablet Take 1 tablet (200 mg total) by mouth 2 (two) times daily. 07/01/20  Yes Harper, Kristen S, PA-C  fluticasone (FLONASE) 50 MCG/ACT nasal spray PLACE 2 SPRAYS INTO BOTH NOSTRILS DAILY. 04/20/20  Yes Fayrene Helper, MD  gabapentin (NEURONTIN) 300 MG capsule TAKE (1) CAPSULE BY MOUTH TWICE DAILY. Patient taking differently: 3 (three) times daily.  05/17/20  Yes Fayrene Helper, MD  HYDROcodone-acetaminophen [facility]) 10-325 MG tablet Take 1 tablet by mouth 2 (two) times daily as needed  for severe pain. 05/27/20  Yes [provider]  hydrocortisone (ANUSOL-HC) 2.5 % rectal cream Place 1 application rectally 2 (two) times daily. 07/01/20  Yes Jodi Mourning, Kristen S, PA-C  insulin glargine (LANTUS SOLOSTAR) 100 UNIT/ML Solostar Pen Inject 6 Units into the skin at bedtime.  06/21/20  Yes [provider]  insulin lispro (HUMALOG KWIKPEN) 100 UNIT/ML KwikPen insulin pen; 100 unit/mL; amt: Per Sliding Scale; If Blood Sugar is 0 to 149, give 0 Units. If Blood Sugar is greater than 149, give 3 Units. subcutaneous Before Meals 06/20/20  Yes [provider]  losartan (COZAAR) 25 MG tablet Take 1 tablet (25 mg total) by mouth daily. 06/20/20  Yes Roxan Hockey, MD  Multiple Vitamin (MULTIVITAMIN WITH MINERALS) TABS tablet Take 1 tablet by mouth at bedtime.   Yes [provider]  NON FORMULARY Diet: _____ Regular, ______ NAS, _______Consistent Carbohydrate, _______NPO ___X__Other :SOFT DIET 06/21/20  Yes [provider]  nystatin (NYSTATIN) powder Apply 1 application topically daily as needed. Special Instructions: fungal rash to groin and inguinal folds 06/20/20  Yes [provider]  ondansetron (ZOFRAN) 4 MG tablet Take 1 tablet (4 mg total) by mouth every 8 (eight) hours as needed for nausea or vomiting. 07/01/20  Yes Erenest Rasher, PA-C  predniSONE (DELTASONE) 5 MG tablet Take 1 tablet (5 mg total) by mouth daily. 06/01/19  Yes Carole Civil, MD  PROAIR HFA 108 [phone] Base) MCG/ACT inhaler INHALE 2 PUFFS BY MOUTH EVERY 6 HOURS AS NEEDED . 11/23/19  Yes Fayrene Helper, MD  Probiotic Product (RISA-BID PROBIOTIC) TABS Take 1 tablet by mouth 2 (two) times daily. 06/21/20  Yes [provider]  pyridostigmine (MESTINON) 60 MG tablet TAKE (1) TABLET BY MOUTH THREE TIMES A DAY. Patient taking differently: Take 60 mg by mouth 3 (three) times daily. TAKE (1) TABLET BY MOUTH THREE TIMES A DAY. 05/17/20  Yes Fayrene Helper, MD    rosuvastatin (CRESTOR) 40  MG tablet TAKE (1) TABLET BY MOUTH AT BEDTIME. 06/20/20  Yes Emokpae, Courage, MD  solifenacin (VESICARE) 10 MG tablet TAKE 1 TABLET BY MOUTH ONCE A DAY. 03/21/20  Yes Perlie Mayo, NP  venlafaxine XR (EFFEXOR-XR) 150 MG 24 hr capsule TAKE 1 CAPSULE BY MOUTH DAILY. 05/17/20  Yes Fayrene Helper, MD  linagliptin (TRADJENTA) 5 MG TABS tablet Take 1 tablet (5 mg total) by mouth daily. Patient not taking: Reported on 07/01/2020 07/11/20   Roxan Hockey, MD    Allergies    Patient has no known allergies.  Review of Systems   Review of Systems  Constitutional: Positive for fatigue. Negative for chills and fever.  HENT: Negative for ear pain and sore throat.   Eyes: Negative for pain and visual disturbance.  Respiratory: Positive for shortness of breath. Negative for cough.   Cardiovascular: Negative for chest pain and palpitations.  Gastrointestinal: Negative for abdominal pain and vomiting.  Genitourinary: Negative for dysuria and hematuria.  Musculoskeletal: Negative for arthralgias and back pain.  Skin: Negative for color change and rash.  Neurological: Positive for light-headedness. Negative for seizures and syncope.  All other systems reviewed and are negative.   Physical Exam Updated Vital Signs BP (!) 163/65 (BP Location: Left Arm)   Pulse 80   Temp 100.1 F (37.8 C) (Oral)   Resp 18   Ht 5\' 6"  (1.676 m)   Wt 109 kg   SpO2 100%   BMI 38.79 kg/m   Physical Exam Vitals and nursing note reviewed.  Constitutional:      General: She is not in acute distress.    Appearance: She is well-developed.  HENT:     Head: Normocephalic and atraumatic.  Eyes:     Pupils: Pupils are equal, round, and reactive to light.     Comments: Conjunctival pallor  Cardiovascular:     Rate and Rhythm: Normal rate and regular rhythm.     Pulses: Normal pulses.  Pulmonary:     Effort: Pulmonary effort is normal. No respiratory distress.     Breath sounds: Normal  breath sounds.  Abdominal:     Palpations: Abdomen is soft.     Tenderness: There is no abdominal tenderness.  Genitourinary:    Comments: Rectal exam with hemorrhoids noted, brown stool, no evident bloody or melena, hemoccult positive Musculoskeletal:     Cervical back: Neck supple.  Skin:    General: Skin is warm and dry.  Neurological:     General: No focal deficit present.     Mental Status: She is alert and oriented to person, place, and time.  Psychiatric:        Mood and Affect: Mood normal.        Behavior: Behavior normal.     ED Results / Procedures / Treatments   Labs (all labs ordered are listed, but only abnormal results are displayed) Labs Reviewed  CBC WITH DIFFERENTIAL/PLATELET - Abnormal; Notable for the following components:      Result Value   RBC 2.14 (*)    Hemoglobin 5.9 (*)    HCT 20.1 (*)    MCHC 29.4 (*)    RDW 20.7 (*)    Platelets 481 (*)    nRBC 0.5 (*)    Abs Immature Granulocytes 0.11 (*)    All other components within normal limits  COMPREHENSIVE METABOLIC PANEL - Abnormal; Notable for the following components:   Potassium 3.3 (*)    Glucose, Bld 203 (*)  BUN 25 (*)    Calcium 7.5 (*)    Total Protein 5.7 (*)    Albumin 1.7 (*)    AST 53 (*)    ALT 69 (*)    Alkaline Phosphatase 140 (*)    All other components within normal limits  HEPATIC FUNCTION PANEL - Abnormal; Notable for the following components:   Total Protein 5.5 (*)    Albumin 1.7 (*)    AST 54 (*)    ALT 70 (*)    Alkaline Phosphatase 138 (*)    All other components within normal limits  BASIC METABOLIC PANEL - Abnormal; Notable for the following components:   Potassium 3.0 (*)    Calcium 8.1 (*)    All other components within normal limits  HEMOGLOBIN AND HEMATOCRIT, BLOOD - Abnormal; Notable for the following components:   Hemoglobin 8.9 (*)    HCT 29.4 (*)    All other components within normal limits  HEMOGLOBIN AND HEMATOCRIT, BLOOD - Abnormal; Notable for  the following components:   Hemoglobin 9.3 (*)    HCT 30.9 (*)    All other components within normal limits  POC OCCULT BLOOD, ED - Abnormal; Notable for the following components:   Fecal Occult Bld POSITIVE (*)    All other components within normal limits  SARS CORONAVIRUS 2 BY RT PCR (HOSPITAL ORDER, [facility])  C DIFFICILE QUICK SCREEN W PCR REFLEX  PROTIME-INR  GLUCOSE, CAPILLARY  HEMOGLOBIN AND HEMATOCRIT, BLOOD  TYPE AND SCREEN  PREPARE RBC (CROSSMATCH)    EKG None  Radiology No results found.  Procedures .Critical Care Performed by: Wyvonnia Dusky, MD Authorized by: Wyvonnia Dusky, MD   Critical care provider statement:    Critical care time (minutes):  45   Critical care was necessary to treat or prevent imminent or life-threatening deterioration of the following conditions:  Circulatory failure   Critical care was time spent personally by me on the following activities:  Discussions with consultants, evaluation of patient's response to treatment, examination of patient, ordering and performing treatments and interventions, ordering and review of laboratory studies, ordering and review of radiographic studies, pulse oximetry, re-evaluation of patient's condition, obtaining history from patient or surrogate and review of old charts Comments:     Symptomatic anemia requiring blood transfusions   (including critical care time)  Medications Ordered in ED Medications  acetaminophen (TYLENOL) tablet 650 mg (has no administration in time range)  fidaxomicin (DIFICID) tablet 200 mg (200 mg Oral Given 07/04/20 0942)  HYDROcodone-acetaminophen (NORCO) 10-325 MG per tablet 1 tablet (has no administration in time range)  diltiazem (CARDIZEM CD) 24 hr capsule 120 mg (120 mg Oral Given 07/04/20 0944)  ezetimibe (ZETIA) tablet 10 mg (10 mg Oral Given 07/04/20 0944)  losartan (COZAAR) tablet 25 mg (25 mg Oral Given 07/04/20 0943)  rosuvastatin (CRESTOR)  tablet 40 mg (40 mg Oral Not Given 07/03/20 2251)  citalopram (CELEXA) tablet 10 mg (10 mg Oral Given 07/04/20 0943)  venlafaxine XR (EFFEXOR-XR) 24 hr capsule 150 mg (150 mg Oral Given 07/04/20 0942)  insulin glargine (LANTUS) injection 6 Units (6 Units Subcutaneous Not Given 07/03/20 2250)  predniSONE (DELTASONE) tablet 5 mg (5 mg Oral Given 07/04/20 0943)  ondansetron (ZOFRAN) tablet 4 mg (has no administration in time range)  darifenacin (ENABLEX) 24 hr tablet 7.5 mg (7.5 mg Oral Given 07/04/20 0944)  gabapentin (NEURONTIN) capsule 300 mg (300 mg Oral Given 07/04/20 0944)  pyridostigmine (MESTINON) tablet 60  mg (60 mg Oral Given 07/04/20 0943)  feeding supplement (PRO-STAT SUGAR FREE 64) liquid 30 mL (30 mLs Oral Given 07/04/20 0944)  cholecalciferol (VITAMIN D3) tablet 2,000 Units (2,000 Units Oral Given 07/03/20 2255)  loratadine (CLARITIN) tablet 10 mg (10 mg Oral Given 07/04/20 0943)  fluticasone (FLONASE) 50 MCG/ACT nasal spray 2 spray (2 sprays Each Nare Given 07/04/20 0942)  albuterol (PROVENTIL) (2.5 MG/3ML) 0.083% nebulizer solution 3 mL (has no administration in time range)  cycloSPORINE (RESTASIS) 0.05 % ophthalmic emulsion 1 drop (1 drop Both Eyes Given 07/04/20 0945)  hydrocortisone (ANUSOL-HC) 2.5 % rectal cream 1 application (1 application Rectal Given 07/04/20 0958)  nystatin (MYCOSTATIN/NYSTOP) topical powder 1 application (1 application Topical Given 07/04/20 0942)  0.9 %  sodium chloride infusion ( Intravenous Not Given 07/03/20 2101)  insulin aspart (novoLOG) injection 0-20 Units (0 Units Subcutaneous Not Given 07/04/20 0800)  brimonidine (ALPHAGAN) 0.2 % ophthalmic solution 1 drop (1 drop Both Eyes Given 07/04/20 0958)    And  timolol (TIMOPTIC) 0.5 % ophthalmic solution 1 drop (1 drop Both Eyes Given 07/04/20 0958)  potassium chloride SA (KLOR-CON) CR tablet 40 mEq (has no administration in time range)  famotidine (PEPCID) IVPB 20 mg premix (has no administration in time range)  sodium chloride 0.9 %  bolus 500 mL (0 mLs Intravenous Stopped 07/03/20 1445)  0.9 %  sodium chloride infusion (10 mL/hr Intravenous New Bag/Given 07/03/20 2037)    ED Course  I have reviewed the triage vital signs and the nursing notes.  Pertinent labs & imaging results that were available during my care of the patient were reviewed by me and considered in my medical decision making (see chart for details).  79 yo female presenting to ED with symptomatic anemia, hgb 5.9 on arrival.  She has pallor and feels fatigued.  She was discharged 2 weeks ago after treatment for C Diff colitis, and had a downtrending hgb during her hospital stay, hgb 7.5 on discharge.  I suspect she has continued to have a slow GI bleed since her discharge, and am less suspicious of an active hemorrhage in the past 24 hours.    This is likely a GI bleed.  She has diverticulosis and small bowel AVM's.  She is not on A/C.    She was consented for blood transfusion and 2 units were ordered in the ED.  I consulted with GI who advised medical admission and they will evaluate tomorrow.  Patient is stable for tele admission at this time.  She is agreeable to staying.  Prior medical records reviewed including colonoscopy, EGD records, and recent hospital course. ECG reviewed, no ischemic changes per my interpretation Labs personally reviewed, hgb 6.9, WBC 9.2  Doubtful of sepsis at this time.  Abdominal exam benign - doubt perforation.    Daughter at bedside provided supplemental history  Clinical Course as of Jul 04 1299  Sun Jul 03, 2020  1549 Patient (with daughter at bedside) consented for blood transfusion - 2 units ordered.  Vitals stable at this time; I do not suspect brisk hemorrhage. GI paged as I suspect this is a GI bleed. Will need admission.   [MT]  1657 Signed out to Dr Crissie Sickles hospitalist   [MT]  [phone] Patient receiving transfusion now (1st), vitals stable, okay for tele admission.  Still awaiting GI callback   [MT]  1834 I spoke to  Dr Laural Golden for GI who agrees with hospitalist admission, recommending NPO at midnight   [MT]  Clinical Course User Index [MT] Aanya Haynes, Carola Rhine, MD    Final Clinical Impression(s) / ED Diagnoses Final diagnoses:  Gastrointestinal hemorrhage, unspecified gastrointestinal hemorrhage type  Symptomatic anemia    Rx / DC Orders ED Discharge Orders    None       Wyvonnia Dusky, MD 07/04/20 1301

## 2020-07-03 NOTE — ED Notes (Signed)
Report recieved 

## 2020-07-03 NOTE — H&P (Signed)
History and Physical    [name] QIO:[id number] DOB: 20-Mar-1941 DOA: 07/03/2020  PCP: Fayrene Helper, MD (Confirm with patient/family/NH records and if not entered, this has to be entered at [facility] point of entry) Patient coming from: SNF  I have personally briefly reviewed patient's old medical records in [place]  Chief Complaint: Acute anemia  HPI: [name] is a 79 y.o. 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 Prior evaluation withEGD in Dec 2019 with mild Schatzki ring s/p dilation, small hiatal hernia, otherwise normal, and colonoscopy Feb 2020 with two simple adenomas, diverticulosis, and hemorrhoids, capsule study September 2020 with small bowel AVMs and erosions, following with Hematology. Patient with recent hospitalization for C. Diff colitis 6/12-21/21. During that admission she was anemic with a Hgb at d/c of 7.5g. She was also noted to have a pelvic mass, probably ovary, that was to be followed up as an outpatient. She was d/c'd to SNF - [facility]. She was seen by Ms. Elnita Maxwell, PA at GI office 07/01/20 for recurrent bloody diarrhea after completing course of vancomycin 125 mg quid.  she was restarted on dificid 200 mg bid. There was a delay in f/u lab which returned today with a Hgb of 6.3 leading to a referral to [facility].   ED Course: Patient is afebrile, BP 138/72. She was not in distress. Lab studies revealed a Hgb of 5.9g, glucose of 203. Stool was heme positive. She was consented and 2 uPRBCs ordered. TR[facility] called to admit patient to complete transfusions and monitor her Hgb.  Review of Systems: As per HPI otherwise 10 point review of systems negative. Endorses pain in the stomach after meals.    Past Medical History:  Diagnosis Date  . Anemia in chronic renal disease 12/30/2015  . Anxiety   . Arthritis    "all over" (01/19/2014)  . Arthritis, lumbar spine    . Carpal tunnel syndrome   . Chronic bronchitis ([place])    "got it q yr for awhile; hasn't had it in awhile" (01/19/2014)  . Chronic kidney disease (CKD) stage G3b/A1, moderately decreased glomerular filtration rate (GFR) between 30-44 mL/min/1.73 square meter and albuminuria creatinine ratio less than 30 mg/g 09/14/2009   Qualifier: Diagnosis of  By: Moshe Cipro MD, Joycelyn Schmid    . Chronic renal disease, stage 3, moderately decreased glomerular filtration rate (GFR) between 30-59 mL/min/1.73 square meter 09/14/2009   Qualifier: Diagnosis of  By: Moshe Cipro MD, Joycelyn Schmid    . Complication of anesthesia    combative  . COPD (chronic obstructive pulmonary disease) ([place])   . Depression   . Diverticulosis 07/2004   Colonscopy Dr Gala Romney  . Esophagitis, erosive 2009  . Exertional shortness of breath   . Gastroesophageal reflux   . LKGMWNUU(725.3)    "usually a couple times/wk" (01/19/2014)  . Heart murmur    saw cardiology In [place], he told her she did not need to come back.  . Hyperlipidemia   . Hypertension   . Impingement syndrome, shoulder   . Iron deficiency anemia   . Mitral regurgitation   . Myasthenia gravis   . Myasthenia gravis in remission ([place]) 11/24/2014  . Myasthenia gravis in remission ([place])   . Obesity   . OSA on CPAP    Negative on last sleep study  . Pneumonia 01/2012  . Pulmonary HTN ([place])   . Schatzki's ring    Last EGD w/ dilation 02/08/11,  2009 & 2007  . Seasonal allergies   . Shoulder pain   . Thyroid disease    "used to take RX; they took me off it" (01/19/2014)  . Tobacco abuse   . Type II diabetes mellitus ([place])     Past Surgical History:  Procedure Laterality Date  . [place] VITRECTOMY WITH 20 GAUGE MVR PORT Left 01/19/2014   Procedure: 25 GAUGE PARS PLANA VITRECTOMY WITH 20 GAUGE MVR PORT; MEMBRAME PEEL; SERUM PATCH; LASER TREATMENT; C3F8;  Surgeon: Hayden Pedro, MD;  Location: [place];  Service: Ophthalmology;  Laterality: Left;  . ABDOMINAL  HYSTERECTOMY    . AGILE CAPSULE N/A 08/05/2019   Procedure: AGILE CAPSULE;  Surgeon: Daneil Dolin, MD;  [facility];  Service: Endoscopy;  Laterality: N/A;  7:30am  . APPENDECTOMY    . CARPAL TUNNEL RELEASE Bilateral   . CATARACT EXTRACTION W/PHACO Left 10/21/2013   Procedure: LEFT CATARACT EXTRACTION PHACO AND INTRAOCULAR LENS PLACEMENT (IOC);  Surgeon: Marylynn Pearson, MD;  Location: [place];  Service: Ophthalmology;  Laterality: Left;  . CHOLECYSTECTOMY    . COLONOSCOPY  05/08/2012   UYQ:[id number] and external hemorrhoids; colonic diverticulosis  . COLONOSCOPY WITH PROPOFOL N/A 02/26/2019   two simple adenomas, diverticulosis,  and internal hemorrhoids.   . ESOPHAGEAL DILATION     "more than 3 times" (01/19/2014)  . ESOPHAGOGASTRODUODENOSCOPY  02/08/11   [place]-Distal esophageal erosion consistent with mild erosive reflux   esophagitis/ Noncritical Schatzki ring, small hiatal hernia otherwise upper/ gastrointestinal tract appeared unremarkable, status post passage  of a Maloney dilation to biopsy disruption of the ring described  . ESOPHAGOGASTRODUODENOSCOPY  04/2012   2 tandem incomplete distal esophagea rings s/p dilation.   . ESOPHAGOGASTRODUODENOSCOPY N/A 09/16/2014   Dr. Gala Romney: Schatzki ring status post dilation/disruption.  Hiatal hernia.  Marland Kitchen ESOPHAGOGASTRODUODENOSCOPY (EGD) WITH PROPOFOL N/A 12/08/2018   EGD with mild Schatzki ring s/p dilation, small hiatal hernia, otherwise normal  . EYE SURGERY     cataracts, bilateral  . GIVENS CAPSULE STUDY N/A 09/29/2019   Procedure: GIVENS CAPSULE STUDY;  Surgeon: Daneil Dolin, MD;  [facility];  Service: Endoscopy;  Laterality: N/A;  7:30am  . INCONTINENCE SURGERY  08/26/09   [place]  . JOINT REPLACEMENT     right total knee  . MALONEY DILATION N/A 09/16/2014   Procedure: Venia Minks DILATION;  Surgeon: Daneil Dolin, MD;  [facility];  Service: Endoscopy;  Laterality: N/A;  . Venia Minks DILATION N/A 12/08/2018    Procedure: Venia Minks DILATION;  Surgeon: Daneil Dolin, MD;  [facility];  Service: Endoscopy;  Laterality: N/A;  . PARS PLANA VITRECTOMY W/ REPAIR OF MACULAR HOLE Left 01/19/2014  . POLYPECTOMY  02/26/2019   Procedure: POLYPECTOMY;  Surgeon: Danie Binder, MD;  [facility];  Service: Endoscopy;;  ascending colon polyps x2  . SAVORY DILATION N/A 09/16/2014   Procedure: SAVORY DILATION;  Surgeon: Daneil Dolin, MD;  [facility];  Service: Endoscopy;  Laterality: N/A;  . TONSILLECTOMY    . TOTAL KNEE ARTHROPLASTY Right 05/13/07   Dr. Aline Brochure  . TRIGGER FINGER RELEASE Right 06/26/2018   Procedure: RIGHT INDEX FINGER TRIGGER FINGER/A-1 PULLEY RELEASE;  Surgeon: Carole Civil, MD;  Location: [facility];  Service: Orthopedics;  Laterality: Right;   Soc Hx - reports she never married. She has 3 sons, 1 daughter, 4 grandsons. 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  She does live alone, but her daughter lives next door and is very attentive.    reports that she quit smoking about 16 years ago. Her smoking use included cigarettes. She has a 12.50 pack-year smoking history. She has never used smokeless tobacco. She reports that she does not drink alcohol and does not use drugs.  No Known Allergies  Family History  Problem Relation Age of Onset  . Cancer Mother   . Heart disease Mother   . Cancer Father   . Heart disease Father   . Diabetes Sister   . Hypertension Brother   . Heart disease Brother   . Cancer Sister   . Kidney failure Sister   . Diabetes Son   . Hypertension Son   . Hypertension Daughter   . Colon cancer Neg Hx      Prior to Admission medications   Medication Sig Start Date End Date Taking? Authorizing Provider  acetaminophen (TYLENOL) 325 MG tablet Take 2 tablets (650 mg total) by mouth every 6 (six) hours as needed for mild pain, fever or headache (or Fever >/= 101). 06/20/20  Yes Emokpae, Courage, MD  Amino  Acids-Protein Hydrolys (FEEDING SUPPLEMENT, PRO-STAT SUGAR FREE 64,) LIQD Take 30 mLs by mouth 3 (three) times daily with meals. 06/21/20  Yes [provider]  Janne Lab Oil (VENELEX) OINT Apply topically in the morning, at noon, and at bedtime. ointment; - ; amt: ribbon; topical 06/20/20  Yes [provider]  brimonidine-timolol (COMBIGAN) 0.2-0.5 % ophthalmic solution Place 1 drop into both eyes every 12 (twelve) hours.   Yes [provider]  cetirizine (ZYRTEC) 10 MG tablet TAKE 1 TABLET BY MOUTH ONCE A DAY. 05/17/20  Yes Fayrene Helper, MD  Cholecalciferol (VITAMIN D3) 2000 units TABS Take 2,000 Units by mouth at bedtime.   Yes [provider]  citalopram (CELEXA) 10 MG tablet TAKE 1 TABLET BY MOUTH ONCE A DAY. 04/14/20  Yes Fayrene Helper, MD  Cranberry 1000 MG CAPS Take 1,000 mg by mouth daily.   Yes [provider]  cycloSPORINE (RESTASIS) 0.05 % ophthalmic emulsion Place 1 drop into both eyes 2 (two) times daily.    Yes [provider]  diltiazem (CARDIZEM CD) 120 MG 24 hr capsule Take 1 capsule (120 mg total) by mouth daily. 06/20/20 06/20/21 Yes Emokpae, Courage, MD  ezetimibe (ZETIA) 10 MG tablet TAKE 1 TABLET BY MOUTH ONCE A DAY. 05/17/20  Yes Fayrene Helper, MD  famotidine (PEPCID) 40 MG tablet Take 40 mg by mouth every evening. 06/21/20  Yes [provider]  fidaxomicin (DIFICID) 200 MG TABS tablet Take 1 tablet (200 mg total) by mouth 2 (two) times daily. 07/01/20  Yes Harper, Kristen S, PA-C  fluticasone (FLONASE) 50 MCG/ACT nasal spray PLACE 2 SPRAYS INTO BOTH NOSTRILS DAILY. 04/20/20  Yes Fayrene Helper, MD  gabapentin (NEURONTIN) 300 MG capsule TAKE (1) CAPSULE BY MOUTH TWICE DAILY. Patient taking differently: 3 (three) times daily.  05/17/20  Yes Fayrene Helper, MD  HYDROcodone-acetaminophen [facility]) 10-325 MG tablet Take 1 tablet by mouth 2 (two) times daily as needed for severe pain. 05/27/20  Yes  [provider]  hydrocortisone (ANUSOL-HC) 2.5 % rectal cream Place 1 application rectally 2 (two) times daily. 07/01/20  Yes Jodi Mourning, Kristen S, PA-C  insulin glargine (LANTUS SOLOSTAR) 100 UNIT/ML Solostar Pen Inject 6 Units into the skin at bedtime.  06/21/20  Yes [provider]  insulin lispro (HUMALOG KWIKPEN) 100 UNIT/ML KwikPen insulin  pen; 100 unit/mL; amt: Per Sliding Scale; If Blood Sugar is 0 to 149, give 0 Units. If Blood Sugar is greater than 149, give 3 Units. subcutaneous Before Meals 06/20/20  Yes [provider]  losartan (COZAAR) 25 MG tablet Take 1 tablet (25 mg total) by mouth daily. 06/20/20  Yes Roxan Hockey, MD  Multiple Vitamin (MULTIVITAMIN WITH MINERALS) TABS tablet Take 1 tablet by mouth at bedtime.   Yes [provider]  NON FORMULARY Diet: _____ Regular, ______ NAS, _______Consistent Carbohydrate, _______NPO ___X__Other :SOFT DIET 06/21/20  Yes [provider]  nystatin (NYSTATIN) powder Apply 1 application topically daily as needed. Special Instructions: fungal rash to groin and inguinal folds 06/20/20  Yes [provider]  ondansetron (ZOFRAN) 4 MG tablet Take 1 tablet (4 mg total) by mouth every 8 (eight) hours as needed for nausea or vomiting. 07/01/20  Yes Erenest Rasher, PA-C  predniSONE (DELTASONE) 5 MG tablet Take 1 tablet (5 mg total) by mouth daily. 06/01/19  Yes Carole Civil, MD  PROAIR HFA 108 [phone] Base) MCG/ACT inhaler INHALE 2 PUFFS BY MOUTH EVERY 6 HOURS AS NEEDED . 11/23/19  Yes Fayrene Helper, MD  Probiotic Product (RISA-BID PROBIOTIC) TABS Take 1 tablet by mouth 2 (two) times daily. 06/21/20  Yes [provider]  pyridostigmine (MESTINON) 60 MG tablet TAKE (1) TABLET BY MOUTH THREE TIMES A DAY. Patient taking differently: Take 60 mg by mouth 3 (three) times daily. TAKE (1) TABLET BY MOUTH THREE TIMES A DAY. 05/17/20  Yes Fayrene Helper, MD  rosuvastatin (CRESTOR) 40 MG tablet  TAKE (1) TABLET BY MOUTH AT BEDTIME. 06/20/20  Yes Emokpae, Courage, MD  solifenacin (VESICARE) 10 MG tablet TAKE 1 TABLET BY MOUTH ONCE A DAY. 03/21/20  Yes Perlie Mayo, NP  venlafaxine XR (EFFEXOR-XR) 150 MG 24 hr capsule TAKE 1 CAPSULE BY MOUTH DAILY. 05/17/20  Yes Fayrene Helper, MD  linagliptin (TRADJENTA) 5 MG TABS tablet Take 1 tablet (5 mg total) by mouth daily. Patient not taking: Reported on 07/01/2020 07/11/20   Roxan Hockey, MD    Physical Exam: Vitals:   07/03/20 1800 07/03/20 1828 07/03/20 1900 07/03/20 1928  BP: 138/72 (!) 152/67 (!) 155/72 (!) 123/107  Pulse: 73 73 72 70  Resp: 16 19 17 17   Temp:      TempSrc:      SpO2: 99% 98% 99% 99%  Weight:      Height:        Constitutional: NAD, calm, comfortable Vitals:   07/03/20 1800 07/03/20 1828 07/03/20 1900 07/03/20 1928  BP: 138/72 (!) 152/67 (!) 155/72 (!) 123/107  Pulse: 73 73 72 70  Resp: 16 19 17 17   Temp:      TempSrc:      SpO2: 99% 98% 99% 99%  Weight:      Height:       General:  Morbidly obese woman in no distress Eyes: PERRL, lids and conjunctivae normal, mild proptosis noted. ENMT: Mucous membranes are moist. Posterior pharynx clear of any exudate or lesions. Neck: normal, supple, no masses, no thyromegaly Respiratory: clear to auscultation bilaterally, no wheezing, no crackles. Normal respiratory effort. No accessory muscle use.  Cardiovascular: Regular rate and rhythm, no murmurs / rubs / gallops. No extremity edema. 2+ pedal pulses. No carotid bruits.  Abdomen: obese, mild  tenderness, no masses palpated. No hepatosplenomegaly-but exam hindered by girth. Bowel sounds positive.  Musculoskeletal: no clubbing / cyanosis. No joint deformity upper and lower  extremities. TKR scar right knee Limited  ROM LE due to obesity, no contractures. poor muscle tone.  Skin: no rashes, lesions, h/o perineal ulceration - not examined at admission. No induration Neurologic: CN 2-12 grossly intact.  Strength 4/5  in all 4.  Psychiatric: Normal judgment and insight. Alert and oriented x 3. Normal mood.     Labs on Admission: I have personally reviewed following labs and imaging studies  CBC: Recent Labs  Lab 07/03/20 1200 07/03/20 1343  WBC 10.9* 9.2  NEUTROABS  --  6.5  HGB 6.3* 5.9*  HCT 20.5* 20.1*  MCV 90.7 93.9  PLT 491* 009*   Basic Metabolic Panel: Recent Labs  Lab 07/03/20 1200 07/03/20 1343  NA 137 137  K 3.2* 3.3*  CL 105 104  CO2 25 26  GLUCOSE 105* 203*  BUN 24* 25*  CREATININE 0.78 0.85  CALCIUM 7.8* 7.5*   GFR: Estimated Creatinine Clearance: 69.3 mL/min (by C-G formula based on SCr of 0.85 mg/dL). Liver Function Tests: Recent Labs  Lab 07/03/20 1343 07/03/20 1420  AST 53* 54*  ALT 69* 70*  ALKPHOS 140* 138*  BILITOT 0.4 0.5  PROT 5.7* 5.5*  ALBUMIN 1.7* 1.7*   No results for input(s): LIPASE, AMYLASE in the last 168 hours. No results for input(s): AMMONIA in the last 168 hours. Coagulation Profile: Recent Labs  Lab 07/03/20 1420  INR 1.2   Cardiac Enzymes: No results for input(s): CKTOTAL, CKMB, CKMBINDEX, TROPONINI in the last 168 hours. BNP (last 3 results) No results for input(s): PROBNP in the last 8760 hours. HbA1C: No results for input(s): HGBA1C in the last 72 hours. CBG: No results for input(s): GLUCAP in the last 168 hours. Lipid Profile: No results for input(s): CHOL, HDL, LDLCALC, TRIG, CHOLHDL, LDLDIRECT in the last 72 hours. Thyroid Function Tests: No results for input(s): TSH, T4TOTAL, FREET4, T3FREE, THYROIDAB in the last 72 hours. Anemia Panel: No results for input(s): VITAMINB12, FOLATE, FERRITIN, TIBC, IRON, RETICCTPCT in the last 72 hours. Urine analysis:    Component Value Date/Time   COLORURINE YELLOW 06/12/2020 1611   APPEARANCEUR CLEAR 06/12/2020 1611   LABSPEC 1.013 06/12/2020 1611   PHURINE 5.0 06/12/2020 1611   GLUCOSEU NEGATIVE 06/12/2020 1611   HGBUR NEGATIVE 06/12/2020 1611   HGBUR negative 06/09/2009 1306    BILIRUBINUR NEGATIVE 06/12/2020 1611   BILIRUBINUR small (A) 09/23/2019 1613   BILIRUBINUR small 11/30/2016 0821   KETONESUR NEGATIVE 06/12/2020 1611   PROTEINUR NEGATIVE 06/12/2020 1611   UROBILINOGEN 1.0 09/23/2019 1613   UROBILINOGEN 0.2 02/25/2011 2020   NITRITE NEGATIVE 06/12/2020 1611   LEUKOCYTESUR NEGATIVE 06/12/2020 1611    Radiological Exams on Admission: No results found.  EKG: Independently reviewed. SR, QT 446ms. No acute changes.  Assessment/Plan Active Problems:   GI bleed   Hypothyroidism   Type 2 diabetes mellitus (HCC)   GERD   CKD stage 3 due to type 2 diabetes mellitus ([place])   Obstructive sleep apnea   Hypertension associated with stage 3 chronic kidney disease due to type 2 diabetes mellitus ([place])  (please populate well all problems here in Problem List. (For example, if patient is on BP meds at home and you resume or decide to hold them, it is a problem that needs to be her. Same for CAD, COPD, HLD and so on)   1. GI bleed - patient with known AVMs by capsule endoscopy Sept '20. She had colonoscopy in '20 with diverticulosis. CT during last admission with bowel wall thickening  suggestive of colitis. EGD 2019 with Schiatski's ring that was dilated. Also with bloody diarrhea suspected to be recurrent C.Diff. Plan Complete 2 uPRBCs as ordered by EDP  F/u H/H 4 hrs post transfusion then q 8  Continue Dificid BID 9A, 9P  Consult Dr. Gala Romney.  2. DM - last A1C 6.9 June 13, '21. Plan Continue home dose of Lantus  Sliding scale coverage  3. Myesthenia Gravis - continue home meds  4. HTN - continue home meds  In summary - patient with GI bleed most likely AVMs; recurrent C.Diff and multiple medical comorbidities. Will continue all home prescription meds.  DVT prophylaxis: SCD's Code Status: full code  Family Communication: Daughter, [name], present during interview and exam. She understands dx and tx plan.  Disposition Plan: Return to SNF when  medically stable  Consults called: GI- Dr. Gala Romney - please call in AM  Admission status: inpatient    Adella Hare MD [facility] Pager [phone]  If 7PM-7AM, please contact night-coverage [email or web address] Password [email or web address]  07/03/2020, 7:45 PM

## 2020-07-04 DIAGNOSIS — K922 Gastrointestinal hemorrhage, unspecified: Secondary | ICD-10-CM

## 2020-07-04 DIAGNOSIS — D649 Anemia, unspecified: Secondary | ICD-10-CM

## 2020-07-04 LAB — HEMOGLOBIN AND HEMATOCRIT, BLOOD
HCT: 29.4 % — ABNORMAL LOW (ref 36.0–46.0)
HCT: 30.9 % — ABNORMAL LOW (ref 36.0–46.0)
HCT: 27.7 % — ABNORMAL LOW (ref 36.0–46.0)
Hemoglobin: 8.9 g/dL — ABNORMAL LOW (ref 12.0–15.0)
Hemoglobin: 9.3 g/dL — ABNORMAL LOW (ref 12.0–15.0)
Hemoglobin: 8.4 g/dL — ABNORMAL LOW (ref 12.0–15.0)

## 2020-07-04 LAB — BPAM RBC
Blood Product Expiration Date: 202108012359
Blood Product Expiration Date: 202108072359
ISSUE DATE / TIME: 202107041600
ISSUE DATE / TIME: 202107042048
Unit Type and Rh: 1700
Unit Type and Rh: 5100

## 2020-07-04 LAB — TYPE AND SCREEN
ABO/RH(D): B POS
Antibody Screen: NEGATIVE
Unit division: 0
Unit division: 0

## 2020-07-04 LAB — BASIC METABOLIC PANEL
Anion gap: 8 (ref 5–15)
BUN: 19 mg/dL (ref 8–23)
CO2: 26 mmol/L (ref 22–32)
Calcium: 8.1 mg/dL — ABNORMAL LOW (ref 8.9–10.3)
Chloride: 106 mmol/L (ref 98–111)
Creatinine, Ser: 0.78 mg/dL (ref 0.44–1.00)
GFR calc Af Amer: >60 mL/min (ref >60)
GFR calc non Af Amer: >60 mL/min (ref >60)
Glucose, Bld: 76 mg/dL (ref 70–99)
Potassium: 3 mmol/L — ABNORMAL LOW (ref 3.5–5.1)
Sodium: 140 mmol/L (ref 135–145)

## 2020-07-04 LAB — GLUCOSE, CAPILLARY: Glucose-Capillary: 76 mg/dL (ref 70–99)

## 2020-07-04 MED ORDER — BRIMONIDINE TARTRATE 0.2 % OP SOLN
1.0000 [drp] | Freq: Two times a day (BID) | OPHTHALMIC | Status: DC
  Administered 2020-07-04 – 2020-07-07 (×7): 1 [drp] via OPHTHALMIC
  Filled 2020-07-04: qty 5

## 2020-07-04 MED ORDER — FAMOTIDINE IN NACL 20-0.9 MG/50ML-% IV SOLN
20.0000 mg | Freq: Two times a day (BID) | INTRAVENOUS | Status: DC
  Administered 2020-07-04 – 2020-07-07 (×7): 20 mg via INTRAVENOUS
  Filled 2020-07-04 (×7): qty 50

## 2020-07-04 MED ORDER — TIMOLOL MALEATE 0.5 % OP SOLN
1.0000 [drp] | Freq: Two times a day (BID) | OPHTHALMIC | Status: DC
  Administered 2020-07-04 – 2020-07-07 (×7): 1 [drp] via OPHTHALMIC
  Filled 2020-07-04: qty 5

## 2020-07-04 MED ORDER — POTASSIUM CHLORIDE CRYS ER 20 MEQ PO TBCR
40.0000 meq | EXTENDED_RELEASE_TABLET | Freq: Two times a day (BID) | ORAL | Status: AC
  Administered 2020-07-04 (×2): 40 meq via ORAL
  Filled 2020-07-04 (×2): qty 2

## 2020-07-04 NOTE — Progress Notes (Signed)
PROGRESS NOTE    Washington Jenna  NLZ:[id number] DOB: October 16, 1941 DOA: 07/03/2020 PCP: Fayrene Helper, MD   Brief Narrative:  Per HPI: [name] is a 79 y.o. 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 Prior evaluation withEGD in Dec 2019 with mild Schatzki ring s/p dilation, small hiatal hernia, otherwise normal, and colonoscopy Feb 2020 with two simple adenomas, diverticulosis, and hemorrhoids, capsule studySeptember 2020with small bowel AVMsand erosions, following withHematology.Patient with recent hospitalization for C. Diff colitis 6/12-21/21. During that admission she was anemic with a Hgb at d/c of 7.5g. She was also noted to have a pelvic mass, probably ovary, that was to be followed up as an outpatient. She was d/c'd to SNF - [facility]. She was seen by Ms. Elnita Maxwell, PA at GI office 07/01/20 for recurrent bloody diarrhea after completing course of vancomycin 125 mg quid.  she was restarted on dificid 200 mg bid. There was a delay in f/u lab which returned today with a Hgb of 6.3 leading to a referral to [facility].   ED Course: Patient is afebrile, BP 138/72. She was not in distress. Lab studies revealed a Hgb of 5.9g, glucose of 203. Stool was heme positive. She was consented and 2 uPRBCs ordered. [facility] called to admit patient to complete transfusions and monitor her Hgb.   Assessment & Plan:   Active Problems:   Hypothyroidism   Type 2 diabetes mellitus ([place])   GERD   CKD stage 3 due to type 2 diabetes mellitus ([place])   Obstructive sleep apnea   Hypertension associated with stage 3 chronic kidney disease due to type 2 diabetes mellitus ([facility])   GI bleed   Acute blood loss anemia improved status post 2 unit PRBC transfusion -Secondary to GI bleed with known AVMs and diverticulosis -She is noted to have bloody diarrhea in the setting of recurrent C. difficile colitis -Continue  follow-up CBC -IV famotidine twice daily -Clear liquid diet for now, plan advance as tolerated -Appreciate GI evaluation to see if further evaluation is needed during inpatient stay  C. difficile colitis -Continue ongoing treatment with Dificid -Enteric precautions  Type 2 diabetes-controlled -Continue Lantus and SSI -Last hemoglobin A1c 6.9% on 05/2020  Myasthenia gravis-currently in remission -Continue prednisone  Hypertension-controlled -Continue home medications  DVT prophylaxis: SCDs Code Status: Full code Family Communication: Spoke with daughter [name] on the phone Disposition Plan: Per GI evaluation  Status is: Inpatient  Remains inpatient appropriate because:IV treatments appropriate due to intensity of illness or inability to take PO and Inpatient level of care appropriate due to severity of illness   Dispo: The patient is from: SNF              Anticipated d/c is to: SNF              Anticipated d/c date is: 1 day              Patient currently is not medically stable to d/c.  Consultants:  GI Procedures:   See below  Antimicrobials:   None   Subjective: Patient seen and evaluated today with no new acute complaints or concerns. No acute concerns or events noted overnight.  She has poor appetite, but denies any nausea or vomiting or abdominal pain.  She continues to have liquidy bowel movements.  Objective: Vitals:   07/03/20 2110 07/04/20 0000 07/04/20 0437 07/04/20 0929  BP: (!) 144/89 (!) 162/61 Marland Kitchen)  163/65   Pulse: 71 79 80   Resp: 18 20 18    Temp: 98.5 F (36.9 C) 98.1 F (36.7 C) 100.1 F (37.8 C)   TempSrc: Oral Oral Oral   SpO2: 100% 100% 99% 100%  Weight:      Height:        Intake/Output Summary (Last 24 hours) at 07/04/2020 1101 Last data filed at 07/04/2020 0900 Gross per 24 hour  Intake 1420 ml  Output --  Net 1420 ml   Filed Weights   07/03/20 1338  Weight: 109 kg    Examination:  General exam: Appears calm and  comfortable  Respiratory system: Clear to auscultation. Respiratory effort normal. Cardiovascular system: S1 & S2 heard, RRR. No JVD, murmurs, rubs, gallops or clicks. No pedal edema. Gastrointestinal system: Abdomen is nondistended, soft and nontender. No organomegaly or masses felt. Normal bowel sounds heard. Central nervous system: Alert and oriented. No focal neurological deficits. Extremities: Symmetric 5 x 5 power. Skin: No rashes, lesions or ulcers Psychiatry: Judgement and insight appear normal. Mood & affect appropriate.     Data Reviewed: I have personally reviewed following labs and imaging studies  CBC: Recent Labs  Lab 07/03/20 1200 07/03/20 1343 07/04/20 0447  WBC 10.9* 9.2  --   NEUTROABS  --  6.5  --   HGB 6.3* 5.9* 8.9*  HCT 20.5* 20.1* 29.4*  MCV 90.7 93.9  --   PLT 491* 481*  --    Basic Metabolic Panel: Recent Labs  Lab 07/03/20 1200 07/03/20 1343 07/04/20 0447  NA 137 137 140  K 3.2* 3.3* 3.0*  CL 105 104 106  CO2 25 26 26   GLUCOSE 105* 203* 76  BUN 24* 25* 19  CREATININE 0.78 0.85 0.78  CALCIUM 7.8* 7.5* 8.1*   GFR: Estimated Creatinine Clearance: 73.6 mL/min (by C-G formula based on SCr of 0.78 mg/dL). Liver Function Tests: Recent Labs  Lab 07/03/20 1343 07/03/20 1420  AST 53* 54*  ALT 69* 70*  ALKPHOS 140* 138*  BILITOT 0.4 0.5  PROT 5.7* 5.5*  ALBUMIN 1.7* 1.7*   No results for input(s): LIPASE, AMYLASE in the last 168 hours. No results for input(s): AMMONIA in the last 168 hours. Coagulation Profile: Recent Labs  Lab 07/03/20 1420  INR 1.2   Cardiac Enzymes: No results for input(s): CKTOTAL, CKMB, CKMBINDEX, TROPONINI in the last 168 hours. BNP (last 3 results) No results for input(s): PROBNP in the last 8760 hours. HbA1C: No results for input(s): HGBA1C in the last 72 hours. CBG: Recent Labs  Lab 07/04/20 0737  GLUCAP 76   Lipid Profile: No results for input(s): CHOL, HDL, LDLCALC, TRIG, CHOLHDL, LDLDIRECT in the  last 72 hours. Thyroid Function Tests: No results for input(s): TSH, T4TOTAL, FREET4, T3FREE, THYROIDAB in the last 72 hours. Anemia Panel: No results for input(s): VITAMINB12, FOLATE, FERRITIN, TIBC, IRON, RETICCTPCT in the last 72 hours. Sepsis Labs: No results for input(s): PROCALCITON, LATICACIDVEN in the last 168 hours.  Recent Results (from the past 240 hour(s))  SARS Coronavirus 2 by RT PCR (hospital order, performed in [facility] hospital lab) Nasopharyngeal Nasopharyngeal Swab     Status: None   Collection Time: 07/03/20  3:35 PM   Specimen: Nasopharyngeal Swab  Result Value Ref Range Status   SARS Coronavirus 2 NEGATIVE NEGATIVE Final    Comment: (NOTE) SARS-CoV-2 target nucleic acids are NOT DETECTED.  The SARS-CoV-2 RNA is generally detectable in upper and lower respiratory specimens during the acute phase of  infection. The lowest concentration of SARS-CoV-2 viral copies this assay can detect is 250 copies / mL. A negative result does not preclude SARS-CoV-2 infection and should not be used as the sole basis for treatment or other patient management decisions.  A negative result may occur with improper specimen collection / handling, submission of specimen other than nasopharyngeal swab, presence of viral mutation(s) within the areas targeted by this assay, and inadequate number of viral copies (<250 copies / mL). A negative result must be combined with clinical observations, patient history, and epidemiological information.  Fact Sheet for Patients:   StrictlyIdeas.no  Fact Sheet for Healthcare Providers: BankingDealers.co.za  This test is not yet approved or  cleared by the [place] FDA and has been authorized for detection and/or diagnosis of SARS-CoV-2 by FDA under an Emergency Use Authorization (EUA).  This EUA will remain in effect (meaning this test can be used) for the duration of the COVID-19 declaration  under Section 564(b)(1) of the Act, 21 U.S.C. section 360bbb-3(b)(1), unless the authorization is terminated or revoked sooner.  Performed at [facility], [address], [place], [place] [address]          Radiology Studies: No results found.      Scheduled Meds: . brimonidine  1 drop Both Eyes BID   And  . timolol  1 drop Both Eyes BID  . cholecalciferol  2,000 Units Oral QHS  . citalopram  10 mg Oral Daily  . cycloSPORINE  1 drop Both Eyes BID  . darifenacin  7.5 mg Oral Daily  . diltiazem  120 mg Oral Daily  . ezetimibe  10 mg Oral Daily  . feeding supplement (PRO-STAT SUGAR FREE 64)  30 mL Oral TID WC  . fidaxomicin  200 mg Oral BID  . fluticasone  2 spray Each Nare Daily  . gabapentin  300 mg Oral TID  . hydrocortisone  1 application Rectal BID  . insulin aspart  0-20 Units Subcutaneous TID WC  . insulin glargine  6 Units Subcutaneous QHS  . loratadine  10 mg Oral Daily  . losartan  25 mg Oral Daily  . nystatin  1 application Topical BID  . potassium chloride  40 mEq Oral BID  . predniSONE  5 mg Oral Daily  . pyridostigmine  60 mg Oral TID  . rosuvastatin  40 mg Oral q1800  . venlafaxine XR  150 mg Oral Daily   Continuous Infusions: . sodium chloride    . famotidine (PEPCID) IV       LOS: 1 day    Time spent: 35 minutes    Sadaf Przybysz Darleen Crocker, DO [facility]  If 7PM-7AM, please contact night-coverage [email or web address] 07/04/2020, 11:01 AM

## 2020-07-04 NOTE — Consult Note (Signed)
Referring Provider: No ref. provider found Primary Care Physician:  Fayrene Helper, MD Primary Gastroenterologist:  Dr.Audel Coakley  Reason for Consultation: Colitis, GI bleed   HPI: Pleasant 79 year old lady admitted to the hospital with intermittent rectal bleeding/copious diarrhea profoundly anemic with a recent hemoglobin 5.9.  Admitted here about 2 weeks ago with C. difficile related colitis after receiving antibiotics for sinusitis and nonspecific colitis.  C. difficile toxin assay positive.  Received vancomycin x7 days.  Diarrhea never really got any better.  Patient states she has had intermittent rectal bleeding for at least several weeks predating her recent colitis diagnosis. Recent CT (6/13) demonstrated patchy left-sided colitis and a right adnexal mass apparently involving the right ovary.  Felt to be at a complex cyst; pelvic ultrasound scheduled for July 9. She was seen in our office on July 2.  Where she was felt to have persisting C. difficile.  Dificid was recommended.  Labs were obtained on 7/4 revealing H&H of 5.9 and 20.1.  She came to the ED for further evaluation and was admitted.  It is notable her white count yesterday was 10.9 and 9.2, on sequential assays.  Creatinine recently mildly elevated but is normal at 0.78 today.  Albumin 1.7. Patient states she continues to have watery diarrhea and small amounts of rectal bleeding.  She is nauseated but not vomiting.  Denies severe abdominal pain at this time does note some left lower quadrant cramping.  Tolerating clear liquids.  She apparently has received her first dosing of Dificid over the past 24 hours according to the nursing staff.  Patient has history of chronic anemia and intermittent hematochezia.  She has known prolapsing hemorrhoids.  Schatzki's ring dilated previously.  Gastric/duodenal AVMs and erosions on EGD/capsule study.  Last colonoscopy (Dr. Oneida Alar) February 2020.  Pancolonic diverticulosis and a  couple of small adenomas removed  Our office algorithm was to proceed with colonoscopy once she has recovered from C. difficile colitis.      Past Medical History:  Diagnosis Date  . Anemia in chronic renal disease 12/30/2015  . Anxiety   . Arthritis    "all over" (01/19/2014)  . Arthritis, lumbar spine   . Carpal tunnel syndrome   . Chronic bronchitis ([place])    "got it q yr for awhile; hasn't had it in awhile" (01/19/2014)  . Chronic kidney disease (CKD) stage G3b/A1, moderately decreased glomerular filtration rate (GFR) between 30-44 mL/min/1.73 square meter and albuminuria creatinine ratio less than 30 mg/g 09/14/2009   Qualifier: Diagnosis of  By: Moshe Cipro MD, Joycelyn Schmid    . Chronic renal disease, stage 3, moderately decreased glomerular filtration rate (GFR) between 30-59 mL/min/1.73 square meter 09/14/2009   Qualifier: Diagnosis of  By: Moshe Cipro MD, Joycelyn Schmid    . Complication of anesthesia    combative  . COPD (chronic obstructive pulmonary disease) ([place])   . Depression   . Diverticulosis 07/2004   Colonscopy Dr Gala Romney  . Esophagitis, erosive 2009  . Exertional shortness of breath   . Gastroesophageal reflux   . FWYOVZCH(885.0)    "usually a couple times/wk" (01/19/2014)  . Heart murmur    saw cardiology In [place], he told her she did not need to come back.  . Hyperlipidemia   . Hypertension   . Impingement syndrome, shoulder   . Iron deficiency anemia   . Mitral regurgitation   . Myasthenia gravis   . Myasthenia gravis in remission ([place]) 11/24/2014  . Myasthenia gravis in remission ([place])   .  Obesity   . OSA on CPAP    Negative on last sleep study  . Pneumonia 01/2012  . Pulmonary HTN ([place])   . Schatzki's ring    Last EGD w/ dilation 02/08/11, 2009 & 2007  . Seasonal allergies   . Shoulder pain   . Thyroid disease    "used to take RX; they took me off it" (01/19/2014)  . Tobacco abuse   . Type II diabetes mellitus ([place])     Past Surgical History:  Procedure  Laterality Date  . [place] VITRECTOMY WITH 20 GAUGE MVR PORT Left 01/19/2014   Procedure: 25 GAUGE PARS PLANA VITRECTOMY WITH 20 GAUGE MVR PORT; MEMBRAME PEEL; SERUM PATCH; LASER TREATMENT; C3F8;  Surgeon: [place] Pedro, MD;  Location: [facility];  Service: Ophthalmology;  Laterality: Left;  . ABDOMINAL HYSTERECTOMY    . AGILE CAPSULE N/A 08/05/2019   Procedure: AGILE CAPSULE;  Surgeon: Daneil Dolin, MD;  [facility];  Service: Endoscopy;  Laterality: N/A;  7:30am  . APPENDECTOMY    . CARPAL TUNNEL RELEASE Bilateral   . CATARACT EXTRACTION W/PHACO Left 10/21/2013   Procedure: LEFT CATARACT EXTRACTION PHACO AND INTRAOCULAR LENS PLACEMENT (IOC);  Surgeon: Marylynn Pearson, MD;  Location: [facility];  Service: Ophthalmology;  Laterality: Left;  . CHOLECYSTECTOMY    . COLONOSCOPY  05/08/2012   [id number]:[id number] and external hemorrhoids; colonic diverticulosis  . COLONOSCOPY WITH PROPOFOL N/A 02/26/2019   two simple adenomas, diverticulosis,  and internal hemorrhoids.   . ESOPHAGEAL DILATION     "more than 3 times" (01/19/2014)  . ESOPHAGOGASTRODUODENOSCOPY  02/08/11   Anayely Constantine-Distal esophageal erosion consistent with mild erosive reflux   esophagitis/ Noncritical Schatzki ring, small hiatal hernia otherwise upper/ gastrointestinal tract appeared unremarkable, status post passage  of a Maloney dilation to biopsy disruption of the ring described  . ESOPHAGOGASTRODUODENOSCOPY  04/2012   2 tandem incomplete distal esophagea rings s/p dilation.   . ESOPHAGOGASTRODUODENOSCOPY N/A 09/16/2014   Dr. Gala Romney: Schatzki ring status post dilation/disruption.  Hiatal hernia.  Marland Kitchen ESOPHAGOGASTRODUODENOSCOPY (EGD) WITH PROPOFOL N/A 12/08/2018   EGD with mild Schatzki ring s/p dilation, small hiatal hernia, otherwise normal  . EYE SURGERY     cataracts, bilateral  . GIVENS CAPSULE STUDY N/A 09/29/2019   Procedure: GIVENS CAPSULE STUDY;  Surgeon: Daneil Dolin, MD;  [facility];  Service: Endoscopy;   Laterality: N/A;  7:30am  . INCONTINENCE SURGERY  08/26/09   [place]  . JOINT REPLACEMENT     right total knee  . MALONEY DILATION N/A 09/16/2014   Procedure: Venia Minks DILATION;  Surgeon: Daneil Dolin, MD;  [facility];  Service: Endoscopy;  Laterality: N/A;  . Venia Minks DILATION N/A 12/08/2018   Procedure: Venia Minks DILATION;  Surgeon: Daneil Dolin, MD;  [facility];  Service: Endoscopy;  Laterality: N/A;  . PARS PLANA VITRECTOMY W/ REPAIR OF MACULAR HOLE Left 01/19/2014  . POLYPECTOMY  02/26/2019   Procedure: POLYPECTOMY;  Surgeon: Danie Binder, MD;  [facility];  Service: Endoscopy;;  ascending colon polyps x2  . SAVORY DILATION N/A 09/16/2014   Procedure: SAVORY DILATION;  Surgeon: Daneil Dolin, MD;  [facility];  Service: Endoscopy;  Laterality: N/A;  . TONSILLECTOMY    . TOTAL KNEE ARTHROPLASTY Right 05/13/07   Dr. Aline Brochure  . TRIGGER FINGER RELEASE Right 06/26/2018   Procedure: RIGHT INDEX FINGER TRIGGER FINGER/A-1 PULLEY RELEASE;  Surgeon: Carole Civil, MD;  [facility];  Service: Orthopedics;  Laterality: Right;    Prior to Admission medications   Medication Sig Start Date End Date Taking? Authorizing Provider  acetaminophen (TYLENOL) 325 MG tablet Take 2 tablets (650 mg total) by mouth every 6 (six) hours as needed for mild pain, fever or headache (or Fever >/= 101). 06/20/20  Yes Emokpae, Courage, MD  Amino Acids-Protein Hydrolys (FEEDING SUPPLEMENT, PRO-STAT SUGAR FREE 64,) LIQD Take 30 mLs by mouth 3 (three) times daily with meals. 06/21/20  Yes [provider]  Janne Lab Oil (VENELEX) OINT Apply topically in the morning, at noon, and at bedtime. ointment; - ; amt: ribbon; topical 06/20/20  Yes [provider]  brimonidine-timolol (COMBIGAN) 0.2-0.5 % ophthalmic solution Place 1 drop into both eyes every 12 (twelve) hours.   Yes [provider]  cetirizine (ZYRTEC) 10 MG tablet TAKE 1  TABLET BY MOUTH ONCE A DAY. 05/17/20  Yes Fayrene Helper, MD  Cholecalciferol (VITAMIN D3) 2000 units TABS Take 2,000 Units by mouth at bedtime.   Yes [provider]  citalopram (CELEXA) 10 MG tablet TAKE 1 TABLET BY MOUTH ONCE A DAY. 04/14/20  Yes Fayrene Helper, MD  Cranberry 1000 MG CAPS Take 1,000 mg by mouth daily.   Yes [provider]  cycloSPORINE (RESTASIS) 0.05 % ophthalmic emulsion Place 1 drop into both eyes 2 (two) times daily.    Yes [provider]  diltiazem (CARDIZEM CD) 120 MG 24 hr capsule Take 1 capsule (120 mg total) by mouth daily. 06/20/20 06/20/21 Yes Emokpae, Courage, MD  ezetimibe (ZETIA) 10 MG tablet TAKE 1 TABLET BY MOUTH ONCE A DAY. 05/17/20  Yes Fayrene Helper, MD  famotidine (PEPCID) 40 MG tablet Take 40 mg by mouth every evening. 06/21/20  Yes [provider]  fidaxomicin (DIFICID) 200 MG TABS tablet Take 1 tablet (200 mg total) by mouth 2 (two) times daily. 07/01/20  Yes Harper, Kristen S, PA-C  fluticasone (FLONASE) 50 MCG/ACT nasal spray PLACE 2 SPRAYS INTO BOTH NOSTRILS DAILY. 04/20/20  Yes Fayrene Helper, MD  gabapentin (NEURONTIN) 300 MG capsule TAKE (1) CAPSULE BY MOUTH TWICE DAILY. Patient taking differently: 3 (three) times daily.  05/17/20  Yes Fayrene Helper, MD  HYDROcodone-acetaminophen [facility]) 10-325 MG tablet Take 1 tablet by mouth 2 (two) times daily as needed for severe pain. 05/27/20  Yes [provider]  hydrocortisone (ANUSOL-HC) 2.5 % rectal cream Place 1 application rectally 2 (two) times daily. 07/01/20  Yes Jodi Mourning, Kristen S, PA-C  insulin glargine (LANTUS SOLOSTAR) 100 UNIT/ML Solostar Pen Inject 6 Units into the skin at bedtime.  06/21/20  Yes [provider]  insulin lispro (HUMALOG KWIKPEN) 100 UNIT/ML KwikPen insulin pen; 100 unit/mL; amt: Per Sliding Scale; If Blood Sugar is 0 to 149, give 0 Units. If Blood Sugar is greater than 149, give 3 Units. subcutaneous Before  Meals 06/20/20  Yes [provider]  losartan (COZAAR) 25 MG tablet Take 1 tablet (25 mg total) by mouth daily. 06/20/20  Yes Roxan Hockey, MD  Multiple Vitamin (MULTIVITAMIN WITH MINERALS) TABS tablet Take 1 tablet by mouth at bedtime.   Yes [provider]  NON FORMULARY Diet: _____ Regular, ______ NAS, _______Consistent Carbohydrate, _______NPO ___X__Other :SOFT DIET 06/21/20  Yes [provider]  nystatin (NYSTATIN) powder Apply 1 application topically daily as needed. Special Instructions: fungal rash to groin and inguinal folds 06/20/20  Yes [provider]  ondansetron (ZOFRAN) 4 MG tablet Take 1 tablet (4  mg total) by mouth every 8 (eight) hours as needed for nausea or vomiting. 07/01/20  Yes Erenest Rasher, PA-C  predniSONE (DELTASONE) 5 MG tablet Take 1 tablet (5 mg total) by mouth daily. 06/01/19  Yes Carole Civil, MD  PROAIR HFA 108 [phone] Base) MCG/ACT inhaler INHALE 2 PUFFS BY MOUTH EVERY 6 HOURS AS NEEDED . 11/23/19  Yes Fayrene Helper, MD  Probiotic Product (RISA-BID PROBIOTIC) TABS Take 1 tablet by mouth 2 (two) times daily. 06/21/20  Yes [provider]  pyridostigmine (MESTINON) 60 MG tablet TAKE (1) TABLET BY MOUTH THREE TIMES A DAY. Patient taking differently: Take 60 mg by mouth 3 (three) times daily. TAKE (1) TABLET BY MOUTH THREE TIMES A DAY. 05/17/20  Yes Fayrene Helper, MD  rosuvastatin (CRESTOR) 40 MG tablet TAKE (1) TABLET BY MOUTH AT BEDTIME. 06/20/20  Yes Emokpae, Courage, MD  solifenacin (VESICARE) 10 MG tablet TAKE 1 TABLET BY MOUTH ONCE A DAY. 03/21/20  Yes Perlie Mayo, NP  venlafaxine XR (EFFEXOR-XR) 150 MG 24 hr capsule TAKE 1 CAPSULE BY MOUTH DAILY. 05/17/20  Yes Fayrene Helper, MD  linagliptin (TRADJENTA) 5 MG TABS tablet Take 1 tablet (5 mg total) by mouth daily. Patient not taking: Reported on 07/01/2020 07/11/20   Roxan Hockey, MD    Current Facility-Administered Medications  Medication Dose  Route Frequency Provider Last Rate Last Admin  . 0.9 %  sodium chloride infusion   Intravenous Continuous Norins, Heinz Knuckles, MD      . acetaminophen (TYLENOL) tablet 650 mg  650 mg Oral Q6H PRN Norins, Heinz Knuckles, MD      . albuterol (PROVENTIL) (2.5 MG/3ML) 0.083% nebulizer solution 3 mL  3 mL Inhalation Q6H PRN Norins, Heinz Knuckles, MD      . brimonidine (ALPHAGAN) 0.2 % ophthalmic solution 1 drop  1 drop Both Eyes BID Manuella Ghazi, Pratik D, DO   1 drop at 07/04/20 0958   And  . timolol (TIMOPTIC) 0.5 % ophthalmic solution 1 drop  1 drop Both Eyes BID Manuella Ghazi, Pratik D, DO   1 drop at 07/04/20 5093  . cholecalciferol (VITAMIN D3) tablet 2,000 Units  2,000 Units Oral QHS Norins, Heinz Knuckles, MD   2,000 Units at 07/03/20 2255  . citalopram (CELEXA) tablet 10 mg  10 mg Oral Daily Norins, Heinz Knuckles, MD   10 mg at 07/04/20 0943  . cycloSPORINE (RESTASIS) 0.05 % ophthalmic emulsion 1 drop  1 drop Both Eyes BID Norins, Heinz Knuckles, MD   1 drop at 07/04/20 0945  . darifenacin (ENABLEX) 24 hr tablet 7.5 mg  7.5 mg Oral Daily Norins, Heinz Knuckles, MD   7.5 mg at 07/04/20 0944  . diltiazem (CARDIZEM CD) 24 hr capsule 120 mg  120 mg Oral Daily Norins, Heinz Knuckles, MD   120 mg at 07/04/20 0944  . ezetimibe (ZETIA) tablet 10 mg  10 mg Oral Daily Norins, Heinz Knuckles, MD   10 mg at 07/04/20 0944  . famotidine (PEPCID) IVPB 20 mg premix  20 mg Intravenous Q12H Shah, Pratik D, DO      . feeding supplement (PRO-STAT SUGAR FREE 64) liquid 30 mL  30 mL Oral TID WC Norins, Heinz Knuckles, MD   30 mL at 07/04/20 0944  . fidaxomicin (DIFICID) tablet 200 mg  200 mg Oral BID Neena Rhymes, MD   200 mg at 07/04/20 0942  . fluticasone (FLONASE) 50 MCG/ACT nasal spray 2 spray  2 spray Each Nare Daily Norins,  Heinz Knuckles, MD   2 spray at 07/04/20 [phone]  . gabapentin (NEURONTIN) capsule 300 mg  300 mg Oral TID Neena Rhymes, MD   300 mg at 07/04/20 0944  . HYDROcodone-acetaminophen (NORCO) 10-325 MG per tablet 1 tablet  1 tablet Oral BID PRN Norins,  Heinz Knuckles, MD      . hydrocortisone (ANUSOL-HC) 2.5 % rectal cream 1 application  1 application Rectal BID Norins, Heinz Knuckles, MD   1 application at 37/90/24 [phone]  . insulin aspart (novoLOG) injection 0-20 Units  0-20 Units Subcutaneous TID WC Norins, Heinz Knuckles, MD      . insulin glargine (LANTUS) injection 6 Units  6 Units Subcutaneous QHS Norins, Heinz Knuckles, MD      . loratadine (CLARITIN) tablet 10 mg  10 mg Oral Daily Norins, Heinz Knuckles, MD   10 mg at 07/04/20 0943  . losartan (COZAAR) tablet 25 mg  25 mg Oral Daily Norins, Heinz Knuckles, MD   25 mg at 07/04/20 0943  . nystatin (MYCOSTATIN/NYSTOP) topical powder 1 application  1 application Topical BID Norins, Heinz Knuckles, MD   1 application at 53/29/92 [phone]  . ondansetron (ZOFRAN) tablet 4 mg  4 mg Oral Q8H PRN Norins, Heinz Knuckles, MD      . potassium chloride SA (KLOR-CON) CR tablet 40 mEq  40 mEq Oral BID Manuella Ghazi, Pratik D, DO      . predniSONE (DELTASONE) tablet 5 mg  5 mg Oral Daily Norins, Heinz Knuckles, MD   5 mg at 07/04/20 0943  . pyridostigmine (MESTINON) tablet 60 mg  60 mg Oral TID Neena Rhymes, MD   60 mg at 07/04/20 0943  . rosuvastatin (CRESTOR) tablet 40 mg  40 mg Oral q1800 Norins, Heinz Knuckles, MD      . venlafaxine XR (EFFEXOR-XR) 24 hr capsule 150 mg  150 mg Oral Daily Norins, Heinz Knuckles, MD   150 mg at 07/04/20 3419    Allergies as of 07/03/2020  . (No Known Allergies)    Family History  Problem Relation Age of Onset  . Cancer Mother   . Heart disease Mother   . Cancer Father   . Heart disease Father   . Diabetes Sister   . Hypertension Brother   . Heart disease Brother   . Cancer Sister   . Kidney failure Sister   . Diabetes Son   . Hypertension Son   . Hypertension Daughter   . Colon cancer Neg Hx     Social History   Socioeconomic History  . Marital status: Single    Spouse name: Not on file  . Number of children: 2  . Years of education: Not on file  . Highest education level: Not on file  Occupational History    . Occupation: disabled    Employer: RETIRED  Tobacco Use  . Smoking status: Former Smoker    Packs/day: 0.50    Years: 25.00    Pack years: 12.50    Types: Cigarettes    Quit date: 01/01/2004    Years since quitting: 16.5  . Smokeless tobacco: Never Used  Vaping Use  . Vaping Use: Never used  Substance and Sexual Activity  . Alcohol use: No  . Drug use: No  . Sexual activity: Never    Birth control/protection: Surgical  Other Topics Concern  . Not on file  Social History Narrative   Patient lives at home by herself   Patient drinks one cup of coffee a day  Patient is right handed.   Social Determinants of Health   Financial Resource Strain:   . Difficulty of Paying Living Expenses:   Food Insecurity:   . Worried About Charity fundraiser in the Last Year:   . Arboriculturist in the Last Year:   Transportation Needs:   . Film/video editor (Medical):   Marland Kitchen Lack of Transportation (Non-Medical):   Physical Activity:   . Days of Exercise per Week:   . Minutes of Exercise per Session:   Stress:   . Feeling of Stress :   Social Connections:   . Frequency of Communication with Friends and Family:   . Frequency of Social Gatherings with Friends and Family:   . Attends Religious Services:   . Active Member of Clubs or Organizations:   . Attends Archivist Meetings:   Marland Kitchen Marital Status:   Intimate Partner Violence:   . Fear of Current or Ex-Partner:   . Emotionally Abused:   Marland Kitchen Physically Abused:   . Sexually Abused:     Review of Systems: As in HPI  Physical Exam: Vital signs in last 24 hours: Temp:  [98.1 F (36.7 C)-100.1 F (37.8 C)] 100.1 F (37.8 C) (07/05 0437) Pulse Rate:  [68-80] 80 (07/05 0437) Resp:  [15-20] 18 (07/05 0437) BP: (122-163)/(49-107) 163/65 (07/05 0437) SpO2:  [96 %-100 %] 100 % (07/05 0929) Weight:  [998 kg] 109 kg (07/04 1338) Last BM Date: 07/04/20 General: Awake conversant.  Nontoxic-appearing Head:  Normocephalic and  atraumatic. Lungs:  Clear throughout to auscultation.   No wheezes, crackles, or rhonchi. No acute distress. Heart:  Regular rate and rhythm; no murmurs, clicks, rubs,  or gallops. Abdomen: Nondistended.  Positive bowel sounds.  She has localized left lower and mid abdominal tenderness to palpation.  No mass or rebound or guarding. Intake/Output from previous day: 07/04 0701 - 07/05 0700 In: 870 [Blood:370; IV Piggyback:500] Out: -  Intake/Output this shift: Total I/O In: 550 [P.O.:550] Out: -   Lab Results: Recent Labs    07/03/20 1200 07/03/20 1343 07/04/20 0447  WBC 10.9* 9.2  --   HGB 6.3* 5.9* 8.9*  HCT 20.5* 20.1* 29.4*  PLT 491* 481*  --    BMET Recent Labs    07/03/20 1200 07/03/20 1343 07/04/20 0447  NA 137 137 140  K 3.2* 3.3* 3.0*  CL 105 104 106  CO2 25 26 26   GLUCOSE 105* 203* 76  BUN 24* 25* 19  CREATININE 0.78 0.85 0.78  CALCIUM 7.8* 7.5* 8.1*   LFT Recent Labs    07/03/20 1420  PROT 5.5*  ALBUMIN 1.7*  AST 54*  ALT 70*  ALKPHOS 138*  BILITOT 0.5  BILIDIR 0.1  IBILI 0.4   PT/INR Recent Labs    07/03/20 1420  LABPROT 15.1  INR 1.2    Impression: 79 year old lady admitted to the hospital with acute on chronic anemia, low-volume rectal bleeding in the setting of recently diagnosed Clostridioides difficile infection/colitis. She has had an inadequate response to an initial round of vancomycin.  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  Although she has significant disease, she does not appear to have fulminant C diff. Acute on chronic GI bleeding.  History of chronic GI bleeding secondary to gastric AVMs and significant hemorrhoids. Clinically, no megacolon. Right adnexal complex cyst a separate issue  Recommendations:   Agree with initiating therapy with Dificid.  Avoid probiotics and PPI therapy for now.  No need for repeat cross-sectional imaging but she will be reassessed tomorrow morning.  Full  liquid diet with protein shakes in between meals  Eventual diagnostic colonoscopy-but not now.  Would consider going on ahead with pelvic ultrasonography to expedite further evaluation of right pelvic mass-per attending.  Further recommendations to follow.          Notice:  This dictation was prepared with Dragon dictation along with smaller phrase technology. Any transcriptional errors that result from this process are unintentional and may not be corrected upon review.

## 2020-07-05 LAB — HEMOGLOBIN AND HEMATOCRIT, BLOOD
HCT: 29 % — ABNORMAL LOW (ref 36.0–46.0)
Hemoglobin: 8.8 g/dL — ABNORMAL LOW (ref 12.0–15.0)

## 2020-07-05 LAB — CBC
HCT: 28.7 % — ABNORMAL LOW (ref 36.0–46.0)
Hemoglobin: 8.7 g/dL — ABNORMAL LOW (ref 12.0–15.0)
MCH: 28.1 pg (ref 26.0–34.0)
MCHC: 30.3 g/dL (ref 30.0–36.0)
MCV: 92.6 fL (ref 80.0–100.0)
Platelets: 400 10*3/uL (ref 150–400)
RBC: 3.1 MIL/uL — ABNORMAL LOW (ref 3.87–5.11)
RDW: 19.7 % — ABNORMAL HIGH (ref 11.5–15.5)
WBC: 10.7 10*3/uL — ABNORMAL HIGH (ref 4.0–10.5)
nRBC: 0 % (ref 0.0–0.2)

## 2020-07-05 LAB — BASIC METABOLIC PANEL
Anion gap: 6 (ref 5–15)
BUN: 17 mg/dL (ref 8–23)
CO2: 25 mmol/L (ref 22–32)
Calcium: 7.8 mg/dL — ABNORMAL LOW (ref 8.9–10.3)
Chloride: 109 mmol/L (ref 98–111)
Creatinine, Ser: 0.61 mg/dL (ref 0.44–1.00)
GFR calc Af Amer: >60 mL/min (ref >60)
GFR calc non Af Amer: >60 mL/min (ref >60)
Glucose, Bld: 59 mg/dL — ABNORMAL LOW (ref 70–99)
Potassium: 3.9 mmol/L (ref 3.5–5.1)
Sodium: 140 mmol/L (ref 135–145)

## 2020-07-05 LAB — GLUCOSE, CAPILLARY
Glucose-Capillary: 96 mg/dL (ref 70–99)
Glucose-Capillary: 107 mg/dL — ABNORMAL HIGH (ref 70–99)
Glucose-Capillary: 93 mg/dL (ref 70–99)
Glucose-Capillary: 60 mg/dL — ABNORMAL LOW (ref 70–99)
Glucose-Capillary: 95 mg/dL (ref 70–99)

## 2020-07-05 LAB — MAGNESIUM: Magnesium: 1.7 mg/dL (ref 1.7–2.4)

## 2020-07-05 MED ORDER — HYDROCODONE-ACETAMINOPHEN 10-325 MG PO TABS: 1.0000 | ORAL_TABLET | Freq: Two times a day (BID) | ORAL | 0 refills | Status: DC | PRN

## 2020-07-05 NOTE — Progress Notes (Signed)
Inpatient Diabetes Program Recommendations  AACE/ADA: New Consensus Statement on Inpatient Glycemic Control (2015)  Target Ranges:  Prepandial:   less than 140 mg/dL      Peak postprandial:   less than 180 mg/dL (1-2 hours)      Critically ill patients:  140 - 180 mg/dL   Results for [name], [name] (MRN [id number]) as of 07/05/2020 10:16  Ref. Range 07/04/2020 07:37 07/04/2020 11:31 07/04/2020 16:53 07/04/2020 20:58  Glucose-Capillary Latest Ref Range: 70 - 99 mg/dL 76 96 107 (H) 93  6 units LANTUS   Results for [name], [name] (MRN [id number]) as of 07/05/2020 10:16  Ref. Range 07/05/2020 07:33  Glucose-Capillary Latest Ref Range: 70 - 99 mg/dL 60 (L)    Admit with: GIB  History: DM, CKD, COPD  Home DM Meds: Lantus 6 units QHS       Humalog 0-3 units per SSI  Current Orders: Lantus 6 units QHS      Novolog Resistant Correction Scale/ SSI (0-20 units) TID AC       MD- Patient with Hypoglycemia this AM (CBG 60) after getting 6 units Lantus last PM.  Please consider:  1. Reduce Lantus to 4 units QHS  2. Reduce Novolog SSI to the Sensitive scale (0-9 units) TID AC + HS    --Will follow patient during hospitalization--  Wyn Quaker RN, MSN, CDE Diabetes Coordinator Inpatient Glycemic Control Team Team Pager: [phone] (8a-5p)

## 2020-07-05 NOTE — Progress Notes (Signed)
Cc'ed to pcp °

## 2020-07-05 NOTE — Care Management Important Message (Signed)
Important Message  Patient Details  Name: [name] MRN: [id number] Date of Birth: Dec 19, 1941   Medicare Important Message Given:  Yes     Boneta Lucks, RN 07/05/2020, 11:54 AM

## 2020-07-05 NOTE — Discharge Summary (Signed)
Physician Discharge Summary  [name] RWE:[id number] DOB: Feb 21, 1941 DOA: 07/03/2020  PCP: Fayrene Helper, MD  Admit date: 07/03/2020  Discharge date: 07/05/2020  Admitted From:SNF  Disposition:  SNF  Recommendations for Outpatient Follow-up:  1. Follow up with PCP in 1-2 weeks 2. Continue on Dificid as prescribed for C. difficile colitis through 07/11/2020 and follow-up with GI as previously scheduled 3. Repeat CBC in 1 week to ensure stability of hemoglobin and hematocrit 4. 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 Prior evaluation withEGD in Dec 2019 with mild Schatzki ring s/p dilation, small hiatal hernia, otherwise normal, and colonoscopy Feb 2020 with two simple adenomas, diverticulosis, and hemorrhoids, capsule studySeptember 2020with small bowel AVMsand erosions, following withHematology.Patient with recent hospitalization for C. Diff colitis 6/12-21/21. During that admission she was anemic with a Hgb at d/c of 7.5g. She was also noted to have a pelvic mass, probably ovary, that was to be followed up as an outpatient. She was d/c'd to SNF - [facility]. She was seen by Ms. Elnita Maxwell, PA at GI office 07/01/20 for recurrent bloody diarrhea after completing course of vancomycin 125 mg quid. she was restarted on dificid 200 mg bid. There was a delay in f/u lab which returned today with a Hgb of 6.3 leading to a referral to [facility].  ED Course:Patient is afebrile, BP 138/72. She was not in distress. Lab  studies revealed a Hgb of 5.9g, glucose of 203. Stool was heme positive. She was consented and 2 uPRBCs ordered. TRH called to admit patient to complete transfusions and monitor her Hgb.  Acute blood loss anemia improved status post 2 unit PRBC transfusion-stable and improved -In the setting of known AVMs and diverticulosis -She is noted to have bloody diarrhea in the setting of recurrent C. 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 difficile colitis -Continue ongoing treatment with Dificid through 7/12 -Follow-up with GI as previous  Type 2 diabetes-controlled -Continue prior medications -Last hemoglobin A1c 6.9% on 05/2020  Myasthenia gravis-currently in remission -Continue prednisone  Hypertension-controlled -Continue home medications   Discharge Diagnoses:  Active Problems:   Hypothyroidism   Type 2 diabetes mellitus ([place])   GERD   CKD stage 3 due to type 2 diabetes mellitus ([place])   Obstructive sleep apnea   Hypertension associated with stage 3 chronic kidney disease due to type 2 diabetes mellitus ([place])   GI bleed    Discharge Instructions  Discharge Instructions    Diet - low sodium heart healthy   Complete by: As directed    Increase activity slowly   Complete by: As directed    No wound care   Complete by: As directed      Allergies as of 07/05/2020   No Known Allergies     Medication List    TAKE these medications   acetaminophen 325  MG tablet Commonly known as: TYLENOL Take 2 tablets (650 mg total) by mouth every 6 (six) hours as needed for mild pain, fever or headache (or Fever >/= 101).   cetirizine 10 MG tablet Commonly  known as: ZYRTEC TAKE 1 TABLET BY MOUTH ONCE A DAY.   citalopram 10 MG tablet Commonly known as: CELEXA TAKE 1 TABLET BY MOUTH ONCE A DAY.   Combigan 0.2-0.5 % ophthalmic solution Generic drug: brimonidine-timolol Place 1 drop into both eyes every 12 (twelve) hours.   Cranberry 1000 MG Caps Take 1,000 mg by mouth daily.   cycloSPORINE 0.05 % ophthalmic emulsion Commonly known as: RESTASIS Place 1 drop into both eyes 2 (two) times daily.   Dificid 200 MG Tabs tablet Generic drug: fidaxomicin Take 1 tablet (200 mg total) by mouth 2 (two) times daily.   diltiazem 120 MG 24 hr capsule Commonly known as: Cardizem CD Take 1 capsule (120 mg total) by mouth daily.   ezetimibe 10 MG tablet Commonly known as: ZETIA TAKE 1 TABLET BY MOUTH ONCE A DAY.   famotidine 40 MG tablet Commonly known as: PEPCID Take 40 mg by mouth every evening.   feeding supplement (PRO-STAT SUGAR FREE 64) Liqd Take 30 mLs by mouth 3 (three) times daily with meals.   fluticasone 50 MCG/ACT nasal spray Commonly known as: FLONASE PLACE 2 SPRAYS INTO BOTH NOSTRILS DAILY.   gabapentin 300 MG capsule Commonly known as: NEURONTIN TAKE (1) CAPSULE BY MOUTH TWICE DAILY. What changed: See the new instructions.   HumaLOG KwikPen 100 UNIT/ML KwikPen Generic drug: insulin lispro insulin pen; 100 unit/mL; amt: Per Sliding Scale; If Blood Sugar is 0 to 149, give 0 Units. If Blood Sugar is greater than 149, give 3 Units. subcutaneous Before Meals   HYDROcodone-acetaminophen 10-325 MG tablet Commonly known as: NORCO Take 1 tablet by mouth 2 (two) times daily as needed for severe pain.   hydrocortisone 2.5 % rectal cream Commonly known as: ANUSOL-HC Place 1 application rectally 2 (two) times daily.   Lantus SoloStar 100 UNIT/ML Solostar Pen Generic drug: insulin glargine Inject 6 Units into the skin at bedtime.   linagliptin 5 MG Tabs tablet Commonly known as: Tradjenta Take 1 tablet (5 mg total) by  mouth daily. Start taking on: July 11, 2020   losartan 25 MG tablet Commonly known as: COZAAR Take 1 tablet (25 mg total) by mouth daily.   multivitamin with minerals Tabs tablet Take 1 tablet by mouth at bedtime.   NON FORMULARY Diet: _____ Regular, ______ NAS, _______Consistent Carbohydrate, _______NPO ___X__Other :SOFT DIET   nystatin powder Generic drug: nystatin Apply 1 application topically daily as needed. Special Instructions: fungal rash to groin and inguinal folds   ondansetron 4 MG tablet Commonly known as: ZOFRAN Take 1 tablet (4 mg total) by mouth every 8 (eight) hours as needed for nausea or vomiting.   predniSONE 5 MG tablet Commonly known as: DELTASONE Take 1 tablet (5 mg total) by mouth daily.   ProAir HFA 108 (90 Base) MCG/ACT inhaler Generic drug: albuterol INHALE 2 PUFFS BY MOUTH EVERY 6 HOURS AS NEEDED .   pyridostigmine 60 MG tablet Commonly known as: MESTINON TAKE (1) TABLET BY MOUTH THREE TIMES A DAY. What changed: See the new instructions.   Risa-Bid Probiotic Tabs Take 1 tablet by mouth 2 (two) times daily.   rosuvastatin 40 MG tablet Commonly known as: CRESTOR TAKE (1) TABLET BY MOUTH AT BEDTIME.   solifenacin 10 MG tablet Commonly known as: VESICARE TAKE  1 TABLET BY MOUTH ONCE A DAY.   Venelex Oint Apply topically in the morning, at noon, and at bedtime. ointment; - ; amt: ribbon; topical   venlafaxine XR 150 MG 24 hr capsule Commonly known as: EFFEXOR-XR TAKE 1 CAPSULE BY MOUTH DAILY.   Vitamin D3 50 MCG (2000 UT) Tabs Take 2,000 Units by mouth at bedtime.       Follow-up Information    Fayrene Helper, MD Follow up in 1 week(s).   Specialty: Family Medicine Contact information: [address], [address] [place] [address] [phone]        [facility]. Schedule an appointment as soon as possible for a visit in 2 week(s).   Specialty: Obstetrics and Gynecology Contact information: [place] Follow up in 2 week(s).   Contact information: [address] [place] [phone]             No Known Allergies  Consultations:  GI   Procedures/Studies: CT ABDOMEN PELVIS WO CONTRAST  Result Date: 06/12/2020 CLINICAL DATA:  Left lower quadrant abdominal pain. EXAM: CT ABDOMEN AND PELVIS WITHOUT CONTRAST TECHNIQUE: Multidetector CT imaging of the abdomen and pelvis was performed following the standard protocol without IV contrast. COMPARISON:  None. FINDINGS: Lower chest: The lung bases are clear. The heart size is normal. Hepatobiliary: The liver is normal. Status post cholecystectomy.There is no biliary ductal dilation. Pancreas: Normal contours without ductal dilatation. No peripancreatic fluid collection. Spleen: Unremarkable. Adrenals/Urinary Tract: --Adrenal glands: Unremarkable. --Right kidney/ureter: There is a nonobstructing stone in the upper pole the right kidney. --Left kidney/ureter: No hydronephrosis or radiopaque kidney stones. --Urinary bladder: Unremarkable. Stomach/Bowel: --Stomach/Duodenum: There is a large hiatal hernia. --Small bowel: Unremarkable. --Colon: There is diffuse circumferential wall thickening of much of the descending colon and sigmoid colon. 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 There are enlarged adjacent regional lymph nodes. There is a moderate amount of stool in the remaining portions of the colon. There may be a single inflamed diverticulum in the ascending colon (axial series 2, image 37). --Appendix: Not visualized. No right lower quadrant inflammation or free fluid. Vascular/Lymphatic: Atherosclerotic calcification is present within the non-aneurysmal abdominal aorta, without hemodynamically significant stenosis. --No retroperitoneal lymphadenopathy. --there are mildly enlarged mesenteric lymph nodes. There are multiple  enlarged regional lymph nodes along the course of the descending colon. --No pelvic or inguinal lymphadenopathy. Reproductive: There is a complex cystic mass in the right hemipelvis likely associated with the right ovary measuring 3.9 x 2.6 cm (axial series 2, image 69). There is a small cystic structure involving the left ovary measuring approximately 1.8 cm. The patient is status post prior hysterectomy. Other: No ascites or free air. The abdominal wall is normal. Musculoskeletal. No acute displaced fractures. IMPRESSION: 1. Diffuse circumferential wall thickening of much of the descending colon and sigmoid colon with multiple enlarged adjacent regional lymph nodes. Findings are concerning for an infectious or inflammatory colitis. Correlation with patient's symptoms and laboratory studies is recommended. As an underlying mass cannot be excluded, a follow-up colonoscopy is recommended as an outpatient. 2. Complex cystic mass in the right hemipelvis likely associated with the right ovary measuring up to 3.9 cm. Follow-up with a nonemergent outpatient pelvic ultrasound is recommended. 3. Large hiatal hernia. 4. Nonobstructive right nephrolithiasis. Aortic Atherosclerosis (ICD10-I70.0). Electronically Signed   By: Constance Holster M.D.   On: 06/12/2020 04:00  Discharge Exam: Vitals:   07/05/20 0532 07/05/20 0903  BP: (!) 165/72   Pulse: 78   Resp: 18   Temp: 99.4 F (37.4 C)   SpO2: 95% 97%   Vitals:   07/04/20 1300 07/04/20 2111 07/05/20 0532 07/05/20 0903  BP: (!) 141/69 (!) 164/69 (!) 165/72   Pulse: 83 73 78   Resp: 19 20 18    Temp: 98.8 F (37.1 C) 99.4 F (37.4 C) 99.4 F (37.4 C)   TempSrc: Oral Oral Oral   SpO2: 100% 100% 95% 97%  Weight:      Height:        General: Pt is alert, awake, not in acute distress Cardiovascular: RRR, S1/S2 +, no rubs, no gallops Respiratory: CTA bilaterally, no wheezing, no rhonchi Abdominal: Soft, NT, ND, bowel sounds + Extremities: no edema,  no cyanosis    The results of significant diagnostics from this hospitalization (including imaging, microbiology, ancillary and laboratory) are listed below for reference.     Microbiology: Recent Results (from the past 240 hour(s))  SARS Coronavirus 2 by RT PCR (hospital order, performed in [facility] hospital lab) Nasopharyngeal Nasopharyngeal Swab     Status: None   Collection Time: 07/03/20  3:35 PM   Specimen: Nasopharyngeal Swab  Result Value Ref Range Status   SARS Coronavirus 2 NEGATIVE NEGATIVE Final    Comment: (NOTE) SARS-CoV-2 target nucleic acids are NOT DETECTED.  The SARS-CoV-2 RNA is generally detectable in upper and lower respiratory specimens during the acute phase of infection. The lowest concentration of SARS-CoV-2 viral copies this assay can detect is 250 copies / mL. A negative result does not preclude SARS-CoV-2 infection and should not be used as the sole basis for treatment or other patient management decisions.  A negative result may occur with improper specimen collection / handling, submission of specimen other than nasopharyngeal swab, presence of viral mutation(s) within the areas targeted by this assay, and inadequate number of viral copies (<250 copies / mL). A negative result must be combined with clinical observations, patient history, and epidemiological information.  Fact Sheet for Patients:   StrictlyIdeas.no  Fact Sheet for Healthcare Providers: BankingDealers.co.za  This test is not yet approved or  cleared by the [place] FDA and has been authorized for detection and/or diagnosis of SARS-CoV-2 by FDA under an Emergency Use Authorization (EUA).  This EUA will remain in effect (meaning this test can be used) for the duration of the COVID-19 declaration under Section 564(b)(1) of the Act, 21 U.S.C. section 360bbb-3(b)(1), unless the authorization is terminated or revoked  sooner.  Performed at [facility], [address], [place], [place] [address]      Labs: BNP (last 3 results) No results for input(s): BNP in the last 8760 hours. Basic Metabolic Panel: Recent Labs  Lab 07/03/20 1200 07/03/20 1343 07/04/20 0447 07/05/20 0452  NA 137 137 140 140  K 3.2* 3.3* 3.0* 3.9  CL 105 104 106 109  CO2 25 26 26 25   GLUCOSE 105* 203* 76 59*  BUN 24* 25* 19 17  CREATININE 0.78 0.85 0.78 0.61  CALCIUM 7.8* 7.5* 8.1* 7.8*  MG  --   --   --  1.7   Liver Function Tests: Recent Labs  Lab 07/03/20 1343 07/03/20 1420  AST 53* 54*  ALT 69* 70*  ALKPHOS 140* 138*  BILITOT 0.4 0.5  PROT 5.7* 5.5*  ALBUMIN 1.7* 1.7*   No results for input(s): LIPASE, AMYLASE in the last 168 hours.  No results for input(s): AMMONIA in the last 168 hours. CBC: Recent Labs  Lab 07/03/20 1200 07/03/20 1200 07/03/20 1343 07/04/20 0447 07/04/20 1209 07/04/20 1950 07/05/20 0452  WBC 10.9*  --  9.2  --   --   --  10.7*  NEUTROABS  --   --  6.5  --   --   --   --   HGB 6.3*   < > 5.9* 8.9* 9.3* 8.4* 8.7*  HCT 20.5*   < > 20.1* 29.4* 30.9* 27.7* 28.7*  MCV 90.7  --  93.9  --   --   --  92.6  PLT 491*  --  481*  --   --   --  400   < > = values in this interval not displayed.   Cardiac Enzymes: No results for input(s): CKTOTAL, CKMB, CKMBINDEX, TROPONINI in the last 168 hours. BNP: Invalid input(s): POCBNP CBG: Recent Labs  Lab 07/04/20 0737 07/04/20 1131 07/04/20 1653 07/04/20 2058 07/05/20 0733  GLUCAP 76 96 107* 93 60*   D-Dimer No results for input(s): DDIMER in the last 72 hours. Hgb A1c No results for input(s): HGBA1C in the last 72 hours. Lipid Profile No results for input(s): CHOL, HDL, LDLCALC, TRIG, CHOLHDL, LDLDIRECT in the last 72 hours. Thyroid function studies No results for input(s): TSH, T4TOTAL, T3FREE, THYROIDAB in the last 72 hours.  Invalid input(s): FREET3 Anemia work up No results for input(s): VITAMINB12, FOLATE, FERRITIN, TIBC,  IRON, RETICCTPCT in the last 72 hours. Urinalysis    Component Value Date/Time   COLORURINE YELLOW 06/12/2020 1611   APPEARANCEUR CLEAR 06/12/2020 1611   LABSPEC 1.013 06/12/2020 1611   PHURINE 5.0 06/12/2020 1611   GLUCOSEU NEGATIVE 06/12/2020 1611   HGBUR NEGATIVE 06/12/2020 1611   HGBUR negative 06/09/2009 1306   BILIRUBINUR NEGATIVE 06/12/2020 1611   BILIRUBINUR small (A) 09/23/2019 1613   BILIRUBINUR small 11/30/2016 0821   KETONESUR NEGATIVE 06/12/2020 1611   PROTEINUR NEGATIVE 06/12/2020 1611   UROBILINOGEN 1.0 09/23/2019 1613   UROBILINOGEN 0.2 02/25/2011 2020   NITRITE NEGATIVE 06/12/2020 1611   LEUKOCYTESUR NEGATIVE 06/12/2020 1611   Sepsis Labs Invalid input(s): PROCALCITONIN,  WBC,  LACTICIDVEN Microbiology Recent Results (from the past 240 hour(s))  SARS Coronavirus 2 by RT PCR (hospital order, performed in [place] hospital lab) Nasopharyngeal Nasopharyngeal Swab     Status: None   Collection Time: 07/03/20  3:35 PM   Specimen: Nasopharyngeal Swab  Result Value Ref Range Status   SARS Coronavirus 2 NEGATIVE NEGATIVE Final    Comment: (NOTE) SARS-CoV-2 target nucleic acids are NOT DETECTED.  The SARS-CoV-2 RNA is generally detectable in upper and lower respiratory specimens during the acute phase of infection. The lowest concentration of SARS-CoV-2 viral copies this assay can detect is 250 copies / mL. A negative result does not preclude SARS-CoV-2 infection and should not be used as the sole basis for treatment or other patient management decisions.  A negative result may occur with improper specimen collection / handling, submission of specimen other than nasopharyngeal swab, presence of viral mutation(s) within the areas targeted by this assay, and inadequate number of viral copies (<250 copies / mL). A negative result must be combined with clinical observations, patient history, and epidemiological information.  Fact Sheet for Patients:    StrictlyIdeas.no  Fact Sheet for Healthcare Providers: BankingDealers.co.za  This test is not yet approved or  cleared by the [place] FDA and has been authorized for detection and/or diagnosis of SARS-CoV-2  by FDA under an Emergency Use Authorization (EUA).  This EUA will remain in effect (meaning this test can be used) for the duration of the COVID-19 declaration under Section 564(b)(1) of the Act, 21 U.S.C. section 360bbb-3(b)(1), unless the authorization is terminated or revoked sooner.  Performed at [facility], [address], [place], [place] [address]      Time coordinating discharge: 35 minutes  SIGNED:   Rodena Goldmann, DO [facility] 07/05/2020, 10:55 AM  If 7PM-7AM, please contact night-coverage [email or web address]

## 2020-07-05 NOTE — Progress Notes (Signed)
CBG = 60. Gave patient orange juice till her breakfast tray gets here. Will recheck after she eats breakfast.

## 2020-07-06 ENCOUNTER — Telehealth: Payer: Self-pay | Admitting: Gastroenterology

## 2020-07-06 DIAGNOSIS — A0472 Enterocolitis due to Clostridium difficile, not specified as recurrent: Secondary | ICD-10-CM

## 2020-07-06 LAB — CBC
HCT: 31.1 % — ABNORMAL LOW (ref 36.0–46.0)
Hemoglobin: 9.2 g/dL — ABNORMAL LOW (ref 12.0–15.0)
MCH: 27.7 pg (ref 26.0–34.0)
MCHC: 29.6 g/dL — ABNORMAL LOW (ref 30.0–36.0)
MCV: 93.7 fL (ref 80.0–100.0)
Platelets: 399 10*3/uL (ref 150–400)
RBC: 3.32 MIL/uL — ABNORMAL LOW (ref 3.87–5.11)
RDW: 19.4 % — ABNORMAL HIGH (ref 11.5–15.5)
WBC: 9.2 10*3/uL (ref 4.0–10.5)
nRBC: 0.2 % (ref 0.0–0.2)

## 2020-07-06 LAB — GLUCOSE, CAPILLARY
Glucose-Capillary: 225 mg/dL — ABNORMAL HIGH (ref 70–99)
Glucose-Capillary: 103 mg/dL — ABNORMAL HIGH (ref 70–99)
Glucose-Capillary: 44 mg/dL — CL (ref 70–99)
Glucose-Capillary: 87 mg/dL (ref 70–99)

## 2020-07-06 NOTE — Progress Notes (Addendum)
Subjective: No appetite, which is chronic. Improved consistency of stool. No N/V. Two stools documented in past 24 hours. No abdominal pain. No overt GI bleeding.   Objective: Vital signs in last 24 hours: Temp:  [98.2 F (36.8 C)-99.5 F (37.5 C)] 98.2 F (36.8 C) (07/07 0535) Pulse Rate:  [66-73] 66 (07/07 0535) Resp:  [18] 18 (07/06 2109) BP: (143-153)/(59-68) 146/59 (07/07 0535) SpO2:  [97 %-100 %] 100 % (07/07 0535) Last BM Date: 07/05/20 General:   Alert and oriented, pleasant Head:  Normocephalic and atraumatic. Abdomen:  Bowel sounds present, soft, non-tender, non-distended. No HSM or hernias noted. No rebound or guarding. No masses appreciated  Neurologic:  Alert and  oriented x4 Psych:  Flat affect  Intake/Output from previous day: 07/06 0701 - 07/07 0700 In: 360 [P.O.:360] Out: 650 [Urine:650] Intake/Output this shift: No intake/output data recorded.  Lab Results: Recent Labs    07/03/20 1343 07/04/20 0447 07/05/20 0452 07/05/20 1054 07/06/20 0424  WBC 9.2  --  10.7*  --  9.2  HGB 5.9*   < > 8.7* 8.8* 9.2*  HCT 20.1*   < > 28.7* 29.0* 31.1*  PLT 481*  --  400  --  399   < > = values in this interval not displayed.   BMET Recent Labs    07/03/20 1343 07/04/20 0447 07/05/20 0452  NA 137 140 140  K 3.3* 3.0* 3.9  CL 104 106 109  CO2 '26 26 25  ' GLUCOSE 203* 76 59*  BUN 25* 19 17  CREATININE 0.85 0.78 0.61  CALCIUM 7.5* 8.1* 7.8*   LFT Recent Labs    07/03/20 1343 07/03/20 1420  PROT 5.7* 5.5*  ALBUMIN 1.7* 1.7*  AST 53* 54*  ALT 69* 70*  ALKPHOS 140* 138*  BILITOT 0.4 0.5  BILIDIR  --  0.1  IBILI  --  0.4   PT/INR Recent Labs    07/03/20 1420  LABPROT 15.1  INR 1.2     Assessment: Well-known 79 year old female admitted with acute on chronic anemia, low-volume hematochezia in setting of C. Difficile colitis, initially diagnosed June 16th (positive antigen, negative toxin, reflex PCR positive). Completed course of vancomycin  with marginal improvement but then recurrence of symptoms after completion of antibiotics. Dificid starting while inpatient and will need to continue for a total of 10 days. She is clinically improved since admission with plans for discharge to [facility].  Anemia stable, with Hgb improving to 9.2 today. Received 2 units PRBCs while inpatient. Known history of IDA felt to be secondary to small bowel AVMs and erosions. She will need diagnostic colonoscopy as outpatient once recovered from acute illness due to CT findings from June 2021.   Right ovarian mass on CT June 2021 will be further evaluated as outpatient with ultrasound.   Mildly elevated transaminases and alk phos: in setting of acute illness. Follow as outpatient.   Hopeful discharge to SNF soon. We will sign off and keep outpatient follow-up as already planned.    Plan: Complete 10 day course of Dificid Avoid PPI for now  CBC, HFP in 1 week as outpatient: we will arrange Outpatient colonoscopy to be arranged in an elective setting once recovered from illness Hopeful discharge to SNF soon Outpatient follow-up of adnexal mass as planned Will sign off for now and see her as outpatient   Annitta Needs, PhD, ANP-BC [facility]    LOS: 3 days    07/06/2020, 8:10 AM

## 2020-07-06 NOTE — Telephone Encounter (Signed)
[name], please arrange HFP and CBC in 1 week. Patient to be discharged today or in near future from hospital to [facility] I believe.

## 2020-07-06 NOTE — Telephone Encounter (Signed)
Spoke with the [facility]. They don't want any lab orders faxed until pt has been released from the hospital. Fax [phone]. I will keep checking back with them.   Spoke with pts daughter and she is aware that pt will need labs in one week.

## 2020-07-06 NOTE — Progress Notes (Signed)
Inpatient Diabetes Program Recommendations  AACE/ADA: New Consensus Statement on Inpatient Glycemic Control (2015)  Target Ranges:  Prepandial:   less than 140 mg/dL      Peak postprandial:   less than 180 mg/dL (1-2 hours)      Critically ill patients:  140 - 180 mg/dL   Lab Results  Component Value Date   GLUCAP 44 (LL) 07/06/2020   HGBA1C 6.9 (H) 06/12/2020    Review of Glycemic Control Results for [name], [name] (MRN [id number]) as of 07/06/2020 10:45  Ref. Range 07/05/2020 07:33 07/05/2020 11:36 07/05/2020 16:41 07/05/2020 21:38 07/06/2020 07:45  Glucose-Capillary Latest Ref Range: 70 - 99 mg/dL 60 (L) 95 225 (H) 103 (H) 44 (LL)    MD- Patient with Hypoglycemia this AM (CBG 44) after getting 6 units Lantus last PM.  Please consider:  1. Reduce Lantus to 4 units QHS  2. Reduce Novolog SSI to the Sensitive scale (0-9 units) TID AC + HS  Thank you, Jenna Roys E. Daltyn Degroat, RN, MSN, CDE  Diabetes Coordinator Inpatient Glycemic Control Team Team Pager [phone] (8am-5pm) 07/06/2020 10:46 AM

## 2020-07-06 NOTE — Progress Notes (Signed)
Patient seen and examined. Stable and in no distress. Chart reviewed and discussed with GI service. Remains stable for discharge. Discharge summary reviewed and appropriate. Waiting for insurance authorization.   Barton Dubois MD [phone]

## 2020-07-07 ENCOUNTER — Inpatient Hospital Stay
Admission: RE | Admit: 2020-07-07 | Discharge: 2020-08-09 | Disposition: A | Discharge status: Home / Self Care | Payer: Medicare Other | Source: Ambulatory Visit | Attending: Internal Medicine | Admitting: Internal Medicine

## 2020-07-07 DIAGNOSIS — K222 Esophageal obstruction: Secondary | ICD-10-CM | POA: Diagnosis not present

## 2020-07-07 DIAGNOSIS — K219 Gastro-esophageal reflux disease without esophagitis: Secondary | ICD-10-CM | POA: Diagnosis not present

## 2020-07-07 DIAGNOSIS — Z96651 Presence of right artificial knee joint: Secondary | ICD-10-CM | POA: Diagnosis not present

## 2020-07-07 DIAGNOSIS — I129 Hypertensive chronic kidney disease with stage 1 through stage 4 chronic kidney disease, or unspecified chronic kidney disease: Secondary | ICD-10-CM | POA: Diagnosis not present

## 2020-07-07 DIAGNOSIS — E1122 Type 2 diabetes mellitus with diabetic chronic kidney disease: Secondary | ICD-10-CM | POA: Diagnosis not present

## 2020-07-07 DIAGNOSIS — R7989 Other specified abnormal findings of blood chemistry: Secondary | ICD-10-CM | POA: Diagnosis not present

## 2020-07-07 DIAGNOSIS — J440 Chronic obstructive pulmonary disease with acute lower respiratory infection: Secondary | ICD-10-CM | POA: Diagnosis not present

## 2020-07-07 DIAGNOSIS — D62 Acute posthemorrhagic anemia: Secondary | ICD-10-CM | POA: Diagnosis not present

## 2020-07-07 DIAGNOSIS — Z741 Need for assistance with personal care: Secondary | ICD-10-CM | POA: Diagnosis not present

## 2020-07-07 DIAGNOSIS — M170 Bilateral primary osteoarthritis of knee: Secondary | ICD-10-CM | POA: Diagnosis not present

## 2020-07-07 DIAGNOSIS — R262 Difficulty in walking, not elsewhere classified: Secondary | ICD-10-CM | POA: Diagnosis not present

## 2020-07-07 DIAGNOSIS — D500 Iron deficiency anemia secondary to blood loss (chronic): Secondary | ICD-10-CM | POA: Diagnosis not present

## 2020-07-07 DIAGNOSIS — K625 Hemorrhage of anus and rectum: Secondary | ICD-10-CM | POA: Diagnosis not present

## 2020-07-07 DIAGNOSIS — J449 Chronic obstructive pulmonary disease, unspecified: Secondary | ICD-10-CM | POA: Diagnosis not present

## 2020-07-07 DIAGNOSIS — J3089 Other allergic rhinitis: Secondary | ICD-10-CM | POA: Diagnosis not present

## 2020-07-07 DIAGNOSIS — E039 Hypothyroidism, unspecified: Secondary | ICD-10-CM

## 2020-07-07 DIAGNOSIS — K922 Gastrointestinal hemorrhage, unspecified: Secondary | ICD-10-CM | POA: Diagnosis not present

## 2020-07-07 DIAGNOSIS — K5790 Diverticulosis of intestine, part unspecified, without perforation or abscess without bleeding: Secondary | ICD-10-CM | POA: Diagnosis not present

## 2020-07-07 DIAGNOSIS — R197 Diarrhea, unspecified: Secondary | ICD-10-CM | POA: Diagnosis not present

## 2020-07-07 DIAGNOSIS — E1129 Type 2 diabetes mellitus with other diabetic kidney complication: Secondary | ICD-10-CM | POA: Diagnosis not present

## 2020-07-07 DIAGNOSIS — K2100 Gastro-esophageal reflux disease with esophagitis, without bleeding: Secondary | ICD-10-CM | POA: Diagnosis not present

## 2020-07-07 DIAGNOSIS — R0902 Hypoxemia: Secondary | ICD-10-CM | POA: Diagnosis not present

## 2020-07-07 DIAGNOSIS — R1909 Other intra-abdominal and pelvic swelling, mass and lump: Secondary | ICD-10-CM | POA: Diagnosis not present

## 2020-07-07 DIAGNOSIS — G4733 Obstructive sleep apnea (adult) (pediatric): Secondary | ICD-10-CM | POA: Diagnosis not present

## 2020-07-07 DIAGNOSIS — G7000 Myasthenia gravis without (acute) exacerbation: Secondary | ICD-10-CM | POA: Diagnosis not present

## 2020-07-07 DIAGNOSIS — J209 Acute bronchitis, unspecified: Secondary | ICD-10-CM | POA: Diagnosis not present

## 2020-07-07 DIAGNOSIS — M6281 Muscle weakness (generalized): Secondary | ICD-10-CM | POA: Diagnosis not present

## 2020-07-07 DIAGNOSIS — A0472 Enterocolitis due to Clostridium difficile, not specified as recurrent: Secondary | ICD-10-CM | POA: Diagnosis not present

## 2020-07-07 DIAGNOSIS — E785 Hyperlipidemia, unspecified: Secondary | ICD-10-CM | POA: Diagnosis not present

## 2020-07-07 DIAGNOSIS — H40053 Ocular hypertension, bilateral: Secondary | ICD-10-CM | POA: Diagnosis not present

## 2020-07-07 DIAGNOSIS — Z794 Long term (current) use of insulin: Secondary | ICD-10-CM | POA: Diagnosis not present

## 2020-07-07 DIAGNOSIS — R1900 Intra-abdominal and pelvic swelling, mass and lump, unspecified site: Secondary | ICD-10-CM | POA: Diagnosis not present

## 2020-07-07 DIAGNOSIS — E1142 Type 2 diabetes mellitus with diabetic polyneuropathy: Secondary | ICD-10-CM | POA: Diagnosis not present

## 2020-07-07 DIAGNOSIS — D72829 Elevated white blood cell count, unspecified: Secondary | ICD-10-CM | POA: Diagnosis not present

## 2020-07-07 DIAGNOSIS — N183 Chronic kidney disease, stage 3 unspecified: Secondary | ICD-10-CM | POA: Diagnosis not present

## 2020-07-07 DIAGNOSIS — E1140 Type 2 diabetes mellitus with diabetic neuropathy, unspecified: Secondary | ICD-10-CM | POA: Diagnosis not present

## 2020-07-07 DIAGNOSIS — D649 Anemia, unspecified: Secondary | ICD-10-CM | POA: Diagnosis not present

## 2020-07-07 LAB — GLUCOSE, CAPILLARY
Glucose-Capillary: 130 mg/dL — ABNORMAL HIGH (ref 70–99)
Glucose-Capillary: 174 mg/dL — ABNORMAL HIGH (ref 70–99)
Glucose-Capillary: 55 mg/dL — ABNORMAL LOW (ref 70–99)

## 2020-07-07 NOTE — Care Management (Addendum)
[employer] requested PT note and latest progress note. Documents have been uploaded to [employer] electronically.   ADDENDUM: Auth received, ID [id number], good 07/07/20-07/11/20. Rug Level II, next review due 07/11/20.

## 2020-07-07 NOTE — TOC Transition Note (Signed)
°  Social Determinants of Health (SDOH) Interventions     Readmission Risk Interventions Readmission Risk Prevention Plan 07/07/2020  Transportation Screening Complete  Medication Review Press photographer) Complete  PCP or Specialist appointment within 3-5 days of discharge Complete  HRI or [place] Not Complete  SW Recovery Care/Counseling Consult Complete  Palliative Care Screening Not Applicable  Skilled Nursing Facility Complete  Some recent data might be hidden

## 2020-07-07 NOTE — TOC Transition Note (Signed)
Transition of Care [facility]) - CM/SW Discharge Note   Patient Details  Name: [name] MRN: [id number] Date of Birth: [birth year]  Transition of Care [facility]) CM/SW Contact:  Parmvir Boomer, Chauncey [place], RN Phone Number: 07/07/2020, 1:15 PM   Clinical Narrative:   Patient ready for DC today, auth received. Keri of [facility] aware and agreeable. [facility] staff to transport. Patient notified.     Final next level of care: Skilled Nursing Facility Barriers to Discharge: Barriers Resolved

## 2020-07-07 NOTE — Discharge Summary (Signed)
Physician Discharge Summary  [name] HGD:[id number] DOB: Nov 30, 1941 DOA: 07/03/2020  PCP: Jenna Helper, MD  Admit date: 07/03/2020  Discharge date: 07/07/2020  Admitted From:SNF  Disposition:  SNF  Recommendations for Outpatient Follow-up:  1. Follow up with PCP in 1-2 weeks 2. Continue on Dificid as prescribed for C. difficile colitis through 07/11/2020 and follow-up with GI as previously scheduled 3. Repeat CBC in 1 week to ensure stability of hemoglobin and hematocrit 4. 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 Prior evaluation withEGD in Dec 2019 with mild Schatzki ring s/p dilation, small hiatal hernia, otherwise normal, and colonoscopy Feb 2020 with two simple adenomas, diverticulosis, and hemorrhoids, capsule studySeptember 2020with small bowel AVMsand erosions, following withHematology.Patient with recent hospitalization for C. Diff colitis 6/12-21/21. During that admission she was anemic with a Hgb at d/c of 7.5g. She was also noted to have a pelvic mass, probably ovary, that was to be followed up as an outpatient. She was d/c'd to SNF - [name]. She was seen by Ms. Jenna Maxwell, PA at GI office 07/01/20 for recurrent bloody diarrhea after completing course of vancomycin 125 mg quid. she was restarted on dificid 200 mg bid. There was a delay in f/u lab which returned today with a Hgb of 6.3 leading to a referral to [facility].  ED Course:Patient is afebrile, BP 138/72. She was not in  distress. Lab studies revealed a Hgb of 5.9g, glucose of 203. Stool was heme positive. She was consented and 2 uPRBCs ordered. TRH called to admit patient to complete transfusions and monitor her Hgb.  Acute blood loss anemia  -improved: status post 2 unit PRBC transfusion -stable Hgb level at discharge -In the setting of known AVMs and diverticulosis -She is noted to have bloody diarrhea in the setting of recurrent C. difficile colitis. -Appreciate GI evaluation with noted need for eventual diagnostic colonoscopy in the near future. 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 difficile colitis -Continue ongoing treatment with Dificid through 7/12 -Follow-up with GI as previous instructed  Type 2 diabetes-controlled -Continue prior home hypoglycemic regimen -Last hemoglobin A1c 6.9% on 05/2020  Myasthenia gravis-currently in remission -Continue prednisone  Hypertension-controlled -Continue home medications   Discharge Diagnoses:  Active Problems:   Hypothyroidism   Type 2 diabetes mellitus ([place])   GERD   CKD stage 3 due to type 2 diabetes mellitus ([place])   Obstructive sleep apnea   Hypertension associated with stage 3 chronic kidney disease due to type 2 diabetes mellitus ([place])   GI bleed    Discharge Instructions  Discharge Instructions    Diet - low sodium heart healthy   Complete by: As directed    Increase activity slowly   Complete by: As directed    No wound care   Complete by: As directed      Allergies as of 07/05/2020   No Known Allergies        Medication List  TAKE these medications   acetaminophen 325 MG tablet Commonly known as: TYLENOL Take 2 tablets (650 mg total) by mouth every 6 (six) hours as needed for mild  pain, fever or headache (or Fever >/= 101).   cetirizine 10 MG tablet Commonly known as: ZYRTEC TAKE 1 TABLET BY MOUTH ONCE A DAY.   citalopram 10 MG tablet Commonly known as: CELEXA TAKE 1 TABLET BY MOUTH ONCE A DAY.   Combigan 0.2-0.5 % ophthalmic solution Generic drug: brimonidine-timolol Place 1 drop into both eyes every 12 (twelve) hours.   Cranberry 1000 MG Caps Take 1,000 mg by mouth daily.   cycloSPORINE 0.05 % ophthalmic emulsion Commonly known as: RESTASIS Place 1 drop into both eyes 2 (two) times daily.   Dificid 200 MG Tabs tablet Generic drug: fidaxomicin Take 1 tablet (200 mg total) by mouth 2 (two) times daily.   diltiazem 120 MG 24 hr capsule Commonly known as: Cardizem CD Take 1 capsule (120 mg total) by mouth daily.   ezetimibe 10 MG tablet Commonly known as: ZETIA TAKE 1 TABLET BY MOUTH ONCE A DAY.   famotidine 40 MG tablet Commonly known as: PEPCID Take 40 mg by mouth every evening.   feeding supplement (PRO-STAT SUGAR FREE 64) Liqd Take 30 mLs by mouth 3 (three) times daily with meals.   fluticasone 50 MCG/ACT nasal spray Commonly known as: FLONASE PLACE 2 SPRAYS INTO BOTH NOSTRILS DAILY.   gabapentin 300 MG capsule Commonly known as: NEURONTIN TAKE (1) CAPSULE BY MOUTH TWICE DAILY. What changed: See the new instructions.   HumaLOG KwikPen 100 UNIT/ML KwikPen Generic drug: insulin lispro insulin pen; 100 unit/mL; amt: Per Sliding Scale; If Blood Sugar is 0 to 149, give 0 Units. If Blood Sugar is greater than 149, give 3 Units. subcutaneous Before Meals   HYDROcodone-acetaminophen 10-325 MG tablet Commonly known as: NORCO Take 1 tablet by mouth 2 (two) times daily as needed for severe pain.   hydrocortisone 2.5 % rectal cream Commonly known as: ANUSOL-HC Place 1 application rectally 2 (two) times daily.   Lantus SoloStar 100 UNIT/ML Solostar Pen Generic drug: insulin glargine Inject 6 Units into the skin at  bedtime.   linagliptin 5 MG Tabs tablet Commonly known as: Tradjenta Take 1 tablet (5 mg total) by mouth daily. Start taking on: July 11, 2020   losartan 25 MG tablet Commonly known as: COZAAR Take 1 tablet (25 mg total) by mouth daily.   multivitamin with minerals Tabs tablet Take 1 tablet by mouth at bedtime.   NON FORMULARY Diet: _____ Regular, ______ NAS, _______Consistent Carbohydrate, _______NPO ___X__Other :SOFT DIET   nystatin powder Generic drug: nystatin Apply 1 application topically daily as needed. Special Instructions: fungal rash to groin and inguinal folds   ondansetron 4 MG tablet Commonly known as: ZOFRAN Take 1 tablet (4 mg total) by mouth every 8 (eight) hours as needed for nausea or vomiting.   predniSONE 5 MG tablet Commonly known as: DELTASONE Take 1 tablet (5 mg total) by mouth daily.   ProAir HFA 108 (90 Base) MCG/ACT inhaler Generic drug: albuterol INHALE 2 PUFFS BY MOUTH EVERY 6 HOURS AS NEEDED .   pyridostigmine 60 MG tablet Commonly known as: MESTINON TAKE (1) TABLET BY MOUTH THREE TIMES A DAY. What changed: See the new instructions.   Risa-Bid Probiotic Tabs Take 1 tablet by mouth 2 (two) times daily.   rosuvastatin 40 MG tablet Commonly known as: CRESTOR TAKE (1) TABLET BY MOUTH AT BEDTIME.   solifenacin 10  MG tablet Commonly known as: VESICARE TAKE 1 TABLET BY MOUTH ONCE A DAY.   Venelex Oint Apply topically in the morning, at noon, and at bedtime. ointment; - ; amt: ribbon; topical   venlafaxine XR 150 MG 24 hr capsule Commonly known as: EFFEXOR-XR TAKE 1 CAPSULE BY MOUTH DAILY.   Vitamin D3 50 MCG (2000 UT) Tabs Take 2,000 Units by mouth at bedtime.           Follow-up Information        Jenna Helper, MD Follow up in 1 week(s).   Specialty: Family Medicine Contact information: [address] [place] [address] [phone]             [facility]. Schedule  an appointment as soon as possible for a visit in 2 week(s).   Specialty: Obstetrics and Gynecology Contact information: [place] Follow up in 2 week(s).   Contact information: [address] [place] [phone]              No Known Allergies  Consultations:  GI   Procedures/Studies: Imaging Results   CT ABDOMEN PELVIS WO CONTRAST  Result Date: 06/12/2020 CLINICAL DATA:  Left lower quadrant abdominal pain. EXAM: CT ABDOMEN AND PELVIS WITHOUT CONTRAST TECHNIQUE: Multidetector CT imaging of the abdomen and pelvis was performed following the standard protocol without IV contrast. COMPARISON:  None. FINDINGS: Lower chest: The lung bases are clear. The heart size is normal. Hepatobiliary: The liver is normal. Status post cholecystectomy.There is no biliary ductal dilation. Pancreas: Normal contours without ductal dilatation. No peripancreatic fluid collection. Spleen: Unremarkable. Adrenals/Urinary Tract: --Adrenal glands: Unremarkable. --Right kidney/ureter: There is a nonobstructing stone in the upper pole the right kidney. --Left kidney/ureter: No hydronephrosis or radiopaque kidney stones. --Urinary bladder: Unremarkable. Stomach/Bowel: --Stomach/Duodenum: There is a large hiatal hernia. --Small bowel: Unremarkable. --Colon: There is diffuse circumferential wall thickening of much of the descending colon and sigmoid colon. 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 There are enlarged adjacent regional lymph nodes. There is a moderate amount of stool in the remaining portions of the colon. There may be a single inflamed diverticulum in the ascending colon (axial series 2, image 37). --Appendix: Not visualized. No right lower quadrant inflammation or free fluid. Vascular/Lymphatic: Atherosclerotic calcification is present within the  non-aneurysmal abdominal aorta, without hemodynamically significant stenosis. --No retroperitoneal lymphadenopathy. --there are mildly enlarged mesenteric lymph nodes. There are multiple enlarged regional lymph nodes along the course of the descending colon. --No pelvic or inguinal lymphadenopathy. Reproductive: There is a complex cystic mass in the right hemipelvis likely associated with the right ovary measuring 3.9 x 2.6 cm (axial series 2, image 69). There is a small cystic structure involving the left ovary measuring approximately 1.8 cm. The patient is status post prior hysterectomy. Other: No ascites or free air. The abdominal wall is normal. Musculoskeletal. No acute displaced fractures. IMPRESSION: 1. Diffuse circumferential wall thickening of much of the descending colon and sigmoid colon with multiple enlarged adjacent regional lymph nodes. Findings are concerning for an infectious or inflammatory colitis. Correlation with patient's symptoms and laboratory studies is recommended. As an underlying mass cannot be excluded, a follow-up colonoscopy is recommended as an outpatient. 2. Complex cystic mass in the right hemipelvis likely associated with the right ovary measuring up to 3.9 cm. Follow-up with a nonemergent outpatient pelvic  ultrasound is recommended. 3. Large hiatal hernia. 4. Nonobstructive right nephrolithiasis. Aortic Atherosclerosis (ICD10-I70.0). Electronically Signed   By: Constance Holster M.D.   On: 06/12/2020 04:00      Discharge Exam:     Vitals:   07/05/20 0532 07/05/20 0903  BP: (!) 165/72   Pulse: 78   Resp: 18   Temp: 99.4 F (37.4 C)   SpO2: 95% 97%         Vitals:   07/04/20 1300 07/04/20 2111 07/05/20 0532 07/05/20 0903  BP: (!) 141/69 (!) 164/69 (!) 165/72   Pulse: 83 73 78   Resp: 19 20 18    Temp: 98.8 F (37.1 C) 99.4 F (37.4 C) 99.4 F (37.4 C)   TempSrc: Oral Oral Oral   SpO2: 100% 100% 95% 97%  Weight:      Height:         General exam: Alert, awake, oriented x 3; in no acute distress, denies nausea, vomiting and abdominal pain. Respiratory system: Clear to auscultation. Respiratory effort normal. Cardiovascular system:RRR. No murmurs, rubs, gallops.  No JVD on exam. Gastrointestinal system: Abdomen is nondistended, soft and nontender. No organomegaly or masses felt. Normal bowel sounds heard. Central nervous system: Alert and oriented. No focal neurological deficits. Extremities: No cyanosis or clubbing; no edema. Skin: No rashes, no petechiae. Psychiatry: Judgement and insight appear normal. Mood & affect appropriate.      The results of significant diagnostics from this hospitalization (including imaging, microbiology, ancillary and laboratory) are listed below for reference.     Microbiology:        Recent Results (from the past 240 hour(s))  SARS Coronavirus 2 by RT PCR ([place] order, performed in [facility] [place] lab) Nasopharyngeal Nasopharyngeal Swab     Status: None   Collection Time: 07/03/20  3:35 PM   Specimen: Nasopharyngeal Swab  Result Value Ref Range Status   SARS Coronavirus 2 NEGATIVE NEGATIVE Final    Comment: (NOTE) SARS-CoV-2 target nucleic acids are NOT DETECTED.  The SARS-CoV-2 RNA is generally detectable in upper and lower respiratory specimens during the acute phase of infection. The lowest concentration of SARS-CoV-2 viral copies this assay can detect is 250 copies / mL. A negative result does not preclude SARS-CoV-2 infection and should not be used as the sole basis for treatment or other patient management decisions.  A negative result may occur with improper specimen collection / handling, submission of specimen other than nasopharyngeal swab, presence of viral mutation(s) within the areas targeted by this assay, and inadequate number of viral copies (<250 copies / mL). A negative result must be combined with clinical observations, patient history,  and epidemiological information.  Fact Sheet for Patients:   StrictlyIdeas.no  Fact Sheet for Healthcare Providers: BankingDealers.co.za  This test is not yet approved or  cleared by the [place] FDA and has been authorized for detection and/or diagnosis of SARS-CoV-2 by FDA under an Emergency Use Authorization (EUA).  This EUA will remain in effect (meaning this test can be used) for the duration of the COVID-19 declaration under Section 564(b)(1) of the Act, 21 U.S.C. section 360bbb-3(b)(1), unless the authorization is terminated or revoked sooner.  Performed at [facility], [address], [place], [place] [address]      Labs: BNP (last 3 results) Recent Labs (within last 365 days)  No results for input(s): BNP in the last 8760 hours.   Basic Metabolic Panel: Last Labs         Recent Labs  Lab 07/03/20 1200 07/03/20 1343 07/04/20 0447 07/05/20 0452  NA 137 137 140 140  K 3.2* 3.3* 3.0* 3.9  CL 105 104 106 109  CO2 25 26 26 25   GLUCOSE 105* 203* 76 59*  BUN 24* 25* 19 17  CREATININE 0.78 0.85 0.78 0.61  CALCIUM 7.8* 7.5* 8.1* 7.8*  MG  --   --   --  1.7     Liver Function Tests: Last Labs       Recent Labs  Lab 07/03/20 1343 07/03/20 1420  AST 53* 54*  ALT 69* 70*  ALKPHOS 140* 138*  BILITOT 0.4 0.5  PROT 5.7* 5.5*  ALBUMIN 1.7* 1.7*     Last Labs   No results for input(s): LIPASE, AMYLASE in the last 168 hours.   Last Labs   No results for input(s): AMMONIA in the last 168 hours.   CBC: Last Labs            Recent Labs  Lab 07/03/20 1200 07/03/20 1200 07/03/20 1343 07/04/20 0447 07/04/20 1209 07/04/20 1950 07/05/20 0452  WBC 10.9*  --  9.2  --   --   --  10.7*  NEUTROABS  --   --  6.5  --   --   --   --   HGB 6.3*   < > 5.9* 8.9* 9.3* 8.4* 8.7*  HCT 20.5*   < > 20.1* 29.4* 30.9* 27.7* 28.7*  MCV 90.7  --  93.9  --   --   --  92.6  PLT 491*  --  481*  --   --   --  400   <  > = values in this interval not displayed.     Cardiac Enzymes: Last Labs   No results for input(s): CKTOTAL, CKMB, CKMBINDEX, TROPONINI in the last 168 hours.   BNP: Last Labs   Invalid input(s): POCBNP   CBG: Last Labs          Recent Labs  Lab 07/04/20 0737 07/04/20 1131 07/04/20 1653 07/04/20 2058 07/05/20 0733  GLUCAP 76 96 107* 93 60*     D-Dimer Recent Labs (last 2 labs)   No results for input(s): DDIMER in the last 72 hours.   Hgb A1c Recent Labs (last 2 labs)   No results for input(s): HGBA1C in the last 72 hours.   Lipid Profile Recent Labs (last 2 labs)   No results for input(s): CHOL, HDL, LDLCALC, TRIG, CHOLHDL, LDLDIRECT in the last 72 hours.   Thyroid function studies  Recent Labs (last 2 labs)   No results for input(s): TSH, T4TOTAL, T3FREE, THYROIDAB in the last 72 hours.  Invalid input(s): FREET3   Anemia work up [employer] (last 2 labs)   No results for input(s): VITAMINB12, FOLATE, FERRITIN, TIBC, IRON, RETICCTPCT in the last 72 hours.   Urinalysis Labs (Brief)          Component Value Date/Time   COLORURINE YELLOW 06/12/2020 1611   APPEARANCEUR CLEAR 06/12/2020 1611   LABSPEC 1.013 06/12/2020 1611   PHURINE 5.0 06/12/2020 1611   GLUCOSEU NEGATIVE 06/12/2020 1611   HGBUR NEGATIVE 06/12/2020 1611   HGBUR negative 06/09/2009 1306   BILIRUBINUR NEGATIVE 06/12/2020 1611   BILIRUBINUR small (A) 09/23/2019 1613   BILIRUBINUR small 11/30/2016 0821   KETONESUR NEGATIVE 06/12/2020 1611   PROTEINUR NEGATIVE 06/12/2020 1611   UROBILINOGEN 1.0 09/23/2019 1613   UROBILINOGEN 0.2 02/25/2011 2020   NITRITE NEGATIVE 06/12/2020 1611   LEUKOCYTESUR NEGATIVE 06/12/2020  19     Sepsis Labs Last Labs   Invalid input(s): PROCALCITONIN,  WBC,  LACTICIDVEN   Microbiology        Recent Results (from the past 240 hour(s))  SARS Coronavirus 2 by RT PCR ([place] order, performed in [facility] [place] lab) Nasopharyngeal  Nasopharyngeal Swab     Status: None   Collection Time: 07/03/20  3:35 PM   Specimen: Nasopharyngeal Swab  Result Value Ref Range Status   SARS Coronavirus 2 NEGATIVE NEGATIVE Final    Comment: (NOTE) SARS-CoV-2 target nucleic acids are NOT DETECTED.  The SARS-CoV-2 RNA is generally detectable in upper and lower respiratory specimens during the acute phase of infection. The lowest concentration of SARS-CoV-2 viral copies this assay can detect is 250 copies / mL. A negative result does not preclude SARS-CoV-2 infection and should not be used as the sole basis for treatment or other patient management decisions.  A negative result may occur with improper specimen collection / handling, submission of specimen other than nasopharyngeal swab, presence of viral mutation(s) within the areas targeted by this assay, and inadequate number of viral copies (<250 copies / mL). A negative result must be combined with clinical observations, patient history, and epidemiological information.  Fact Sheet for Patients:   StrictlyIdeas.no  Fact Sheet for Healthcare Providers: BankingDealers.co.za  This test is not yet approved or  cleared by the [place] FDA and has been authorized for detection and/or diagnosis of SARS-CoV-2 by FDA under an Emergency Use Authorization (EUA).  This EUA will remain in effect (meaning this test can be used) for the duration of the COVID-19 declaration under Section 564(b)(1) of the Act, 21 U.S.C. section 360bbb-3(b)(1), unless the authorization is terminated or revoked sooner.  Performed at [facility], [address] [place], [place] [address]      Time coordinating discharge: 30 minutes   Barton Dubois MD [phone]

## 2020-07-07 NOTE — Plan of Care (Signed)
  Problem: Acute Rehab PT Goals(only PT should resolve) Goal: Pt Will Go Supine/Side To Sit Outcome: Progressing Flowsheets (Taken 07/07/2020 0959) Pt will go Supine/Side to Sit: with minimal assist Goal: Patient Will Transfer Sit To/From Stand Outcome: Progressing Flowsheets (Taken 07/07/2020 0959) Patient will transfer sit to/from stand:  with minimal assist  with moderate assist Goal: Pt Will Transfer Bed To Chair/Chair To Bed Outcome: Progressing Flowsheets (Taken 07/07/2020 0959) Pt will Transfer Bed to Chair/Chair to Bed:  with min assist  with mod assist Goal: Pt Will Ambulate Outcome: Progressing Flowsheets (Taken 07/07/2020 0959) Pt will Ambulate:  10 feet  with moderate assist  with rolling walker   10:00 AM, 07/07/20 Lonell Grandchild, MPT Physical Therapist with [facility] [phone] office [phone] mobile phone

## 2020-07-07 NOTE — Evaluation (Signed)
Physical Therapy Evaluation Patient Details Name: [name] MRN: [id number] DOB: 11/19/1941 Today's Date: 07/07/2020   History of Present Illness  [name] is a 79 y.o. 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 Prior evaluation with EGD in Dec 2019 with mild Schatzki ring s/p dilation, small hiatal hernia, otherwise normal, and colonoscopy Feb 2020 with two simple adenomas, diverticulosis, and hemorrhoids, capsule study September 2020 with small bowel AVMs and erosions, following with Hematology. Patient with recent hospitalization for C. Diff colitis 6/12-21/21. During that admission she was anemic with a Hgb at d/c of 7.5g. She was also noted to have a pelvic mass, probably ovary, that was to be followed up as an outpatient. She was d/c'd to SNF - [facility]. She was seen by Ms. Elnita Maxwell, PA at GI office 07/01/20 for recurrent bloody diarrhea after completing course of vancomycin 125 mg quid.  she was restarted on dificid 200 mg bid. There was a delay in f/u lab which returned today with a Hgb of 6.3 leading to a referral to [facility].    Clinical Impression  Patient demonstrates slow labored movement for sitting up at bedside requiring Mod assist to move BLE, unable to maintain standing balance using RW beyond 20-30 seconds or take steps to transfer to chair secondary to weakness and fatigue.  Patient required Mod/max assist to reposition when put back to bed.  Patient will benefit from continued physical therapy in hospital and recommended venue below to increase strength, balance, endurance for safe ADLs and gait.     Follow Up Recommendations SNF    Equipment Recommendations  None recommended by PT    Recommendations for Other Services       Precautions / Restrictions Precautions Precautions: Fall Restrictions Weight Bearing Restrictions: No      Mobility  Bed Mobility Overal  bed mobility: Needs Assistance Bed Mobility: Supine to Sit;Sit to Supine     Supine to sit: Mod assist Sit to supine: Mod assist   General bed mobility comments: slow labored movement  Transfers Overall transfer level: Needs assistance Equipment used: Rolling walker (2 wheeled) Transfers: Sit to/from Stand Sit to Stand: Mod assist         General transfer comment: increased time, labored movement  Ambulation/Gait                Stairs            Wheelchair Mobility    Modified Rankin (Stroke Patients Only)       Balance Overall balance assessment: Needs assistance Sitting-balance support: Feet supported;No upper extremity supported Sitting balance-Leahy Scale: Fair Sitting balance - Comments: fair/good seated at bedside   Standing balance support: During functional activity;Bilateral upper extremity supported Standing balance-Leahy Scale: Poor Standing balance comment: using RW                             Pertinent Vitals/Pain Pain Assessment: Faces Faces Pain Scale: Hurts little more Pain Location: bilateral hips/legs Pain Descriptors / Indicators: Discomfort;Grimacing;Sore Pain Intervention(s): Limited activity within patient's tolerance;Monitored during session    [place] expects to be discharged to:: Private residence Living Arrangements: Alone Available Help at Discharge: Family;Available PRN/intermittently Type of Home: Other(Comment) Home Access: Stairs to enter Entrance Stairs-Rails: Left Entrance Stairs-Number of Steps: 4 Home Layout: One level Home Equipment: Shower seat;Cane - single point  Prior Function Level of Independence: Independent with assistive device(s)         Comments: household ambulator with SPC PRN     Hand Dominance        Extremity/Trunk Assessment   Upper Extremity Assessment Upper Extremity Assessment: Generalized weakness    Lower Extremity Assessment Lower  Extremity Assessment: Generalized weakness    Cervical / Trunk Assessment Cervical / Trunk Assessment: Normal  Communication   Communication: No difficulties  Cognition Arousal/Alertness: Awake/alert Behavior During Therapy: WFL for tasks assessed/performed Overall Cognitive Status: Within Functional Limits for tasks assessed                                        General Comments      Exercises     Assessment/Plan    PT Assessment Patient needs continued PT services  PT Problem List Decreased strength;Decreased activity tolerance;Decreased balance;Decreased mobility       PT Treatment Interventions Balance training;Gait training;Stair training;Functional mobility training;Therapeutic activities;Therapeutic exercise;Patient/family education    PT Goals (Current goals can be found in the Care Plan section)  Acute Rehab PT Goals Patient Stated Goal: return home after rehab PT Goal Formulation: With patient Time For Goal Achievement: 07/21/20 Potential to Achieve Goals: Good    Frequency Min 3X/week   Barriers to discharge        Co-evaluation               AM-PAC PT "6 Clicks" Mobility  Outcome Measure Help needed turning from your back to your side while in a flat bed without using bedrails?: A Lot Help needed moving from lying on your back to sitting on the side of a flat bed without using bedrails?: A Lot Help needed moving to and from a bed to a chair (including a wheelchair)?: A Lot Help needed standing up from a chair using your arms (e.g., wheelchair or bedside chair)?: A Lot Help needed to walk in hospital room?: Total Help needed climbing 3-5 steps with a railing? : Total 6 Click Score: 10    End of Session   Activity Tolerance: Patient tolerated treatment well;Patient limited by fatigue Patient left: in bed;with call bell/phone within reach Nurse Communication: Mobility status PT Visit Diagnosis: Unsteadiness on feet  (R26.81);Other abnormalities of gait and mobility (R26.89);Muscle weakness (generalized) (M62.81)    Time: 6468-0321 PT Time Calculation (min) (ACUTE ONLY): 21 min   Charges:   PT Evaluation $PT Eval Moderate Complexity: 1 Mod PT Treatments $Therapeutic Activity: 8-22 mins        9:58 AM, 07/07/20 Lonell Grandchild, MPT Physical Therapist with [facility] [phone] office [phone] mobile phone

## 2020-07-07 NOTE — Clinical Social Work Note (Signed)
Additional clinical documents uploaded to [facility].  Tobi Bastos, LCSW Transitions of Care Clinical Social Worker [facility] Ph: [phone]

## 2020-07-08 ENCOUNTER — Non-Acute Institutional Stay (SKILLED_NURSING_FACILITY): Payer: Medicare Other | Admitting: Adult Health

## 2020-07-08 ENCOUNTER — Encounter: Payer: Self-pay | Admitting: Adult Health

## 2020-07-08 ENCOUNTER — Ambulatory Visit (HOSPITAL_COMMUNITY)
Admission: RE | Admit: 2020-07-08 | Discharge: 2020-07-08 | Disposition: A | Discharge status: Home / Self Care | Payer: Medicare Other | Source: Ambulatory Visit | Attending: Adult Health | Admitting: Adult Health

## 2020-07-08 ENCOUNTER — Telehealth: Payer: Self-pay | Admitting: Internal Medicine

## 2020-07-08 ENCOUNTER — Other Ambulatory Visit: Payer: Self-pay | Admitting: Adult Health

## 2020-07-08 DIAGNOSIS — E1122 Type 2 diabetes mellitus with diabetic chronic kidney disease: Secondary | ICD-10-CM

## 2020-07-08 DIAGNOSIS — Z794 Long term (current) use of insulin: Secondary | ICD-10-CM

## 2020-07-08 DIAGNOSIS — R1900 Intra-abdominal and pelvic swelling, mass and lump, unspecified site: Secondary | ICD-10-CM | POA: Diagnosis not present

## 2020-07-08 DIAGNOSIS — K625 Hemorrhage of anus and rectum: Secondary | ICD-10-CM | POA: Diagnosis not present

## 2020-07-08 DIAGNOSIS — A0472 Enterocolitis due to Clostridium difficile, not specified as recurrent: Secondary | ICD-10-CM | POA: Diagnosis not present

## 2020-07-08 DIAGNOSIS — N183 Chronic kidney disease, stage 3 unspecified: Secondary | ICD-10-CM

## 2020-07-08 DIAGNOSIS — G4733 Obstructive sleep apnea (adult) (pediatric): Secondary | ICD-10-CM

## 2020-07-08 DIAGNOSIS — E1140 Type 2 diabetes mellitus with diabetic neuropathy, unspecified: Secondary | ICD-10-CM

## 2020-07-08 DIAGNOSIS — J3089 Other allergic rhinitis: Secondary | ICD-10-CM

## 2020-07-08 DIAGNOSIS — E1142 Type 2 diabetes mellitus with diabetic polyneuropathy: Secondary | ICD-10-CM

## 2020-07-08 DIAGNOSIS — E1169 Type 2 diabetes mellitus with other specified complication: Secondary | ICD-10-CM

## 2020-07-08 DIAGNOSIS — I129 Hypertensive chronic kidney disease with stage 1 through stage 4 chronic kidney disease, or unspecified chronic kidney disease: Secondary | ICD-10-CM

## 2020-07-08 DIAGNOSIS — J449 Chronic obstructive pulmonary disease, unspecified: Secondary | ICD-10-CM

## 2020-07-08 DIAGNOSIS — K219 Gastro-esophageal reflux disease without esophagitis: Secondary | ICD-10-CM | POA: Diagnosis not present

## 2020-07-08 DIAGNOSIS — E785 Hyperlipidemia, unspecified: Secondary | ICD-10-CM

## 2020-07-08 LAB — GLUCOSE, CAPILLARY: Glucose-Capillary: 114 mg/dL — ABNORMAL HIGH (ref 70–99)

## 2020-07-08 MED ORDER — HYDROCODONE-ACETAMINOPHEN 10-325 MG PO TABS: 1.0000 | ORAL_TABLET | Freq: Two times a day (BID) | ORAL | 0 refills | Status: AC | PRN

## 2020-07-08 NOTE — Telephone Encounter (Signed)
Yes, office visit in about 4 weeks.

## 2020-07-08 NOTE — Telephone Encounter (Signed)
Called Kim back and was transferred to a number that continues to ring. Will call back.

## 2020-07-08 NOTE — Progress Notes (Signed)
Location:    [place] Room Number: 153/P Place of Service:  SNF (31)   CODE STATUS: Full Code  No Known Allergies  Chief Complaint  Patient presents with  . Hospitalization Follow-up    Hospitalization Follow Up    HPI:  She is a 79 year old woman who has been hospitalized from 07-03-20 through 07-07-20. She had a lower GI bleed which required a transfusion. She continues to have loose stools with some blood present; however the frequency is slowing down. She does have some left lower quad pain. She is here for short term rehab with her goal to return back home. She continues to be followed for her chronic illnesses including: gerd; hypertension; diabetes.   Past Medical History:  Diagnosis Date  . Anemia in chronic renal disease 12/30/2015  . Anxiety   . Arthritis    "all over" (01/19/2014)  . Arthritis, lumbar spine   . Carpal tunnel syndrome   . Chronic bronchitis ([place])    "got it q yr for awhile; hasn't had it in awhile" (01/19/2014)  . Chronic kidney disease (CKD) stage G3b/A1, moderately decreased glomerular filtration rate (GFR) between 30-44 mL/min/1.73 square meter and albuminuria creatinine ratio less than 30 mg/g 09/14/2009   Qualifier: Diagnosis of  By: Moshe Cipro MD, Joycelyn Schmid    . Chronic renal disease, stage 3, moderately decreased glomerular filtration rate (GFR) between 30-59 mL/min/1.73 square meter 09/14/2009   Qualifier: Diagnosis of  By: Moshe Cipro MD, Joycelyn Schmid    . Complication of anesthesia    combative  . COPD (chronic obstructive pulmonary disease) ([place])   . Depression   . Diverticulosis 07/2004   Colonscopy Dr Gala Romney  . Esophagitis, erosive 2009  . Exertional shortness of breath   . Gastroesophageal reflux   . XKPVVZSM(270.7)    "usually a couple times/wk" (01/19/2014)  . Heart murmur    saw cardiology In [place], he told her she did not need to come back.  . Hyperlipidemia   . Hypertension   . Impingement syndrome, shoulder   .  Iron deficiency anemia   . Mitral regurgitation   . Myasthenia gravis   . Myasthenia gravis in remission ([place]) 11/24/2014  . Myasthenia gravis in remission ([place])   . Obesity   . OSA on CPAP    Negative on last sleep study  . Pneumonia 01/2012  . Pulmonary HTN ([place])   . Schatzki's ring    Last EGD w/ dilation 02/08/11, 2009 & 2007  . Seasonal allergies   . Shoulder pain   . Thyroid disease    "used to take RX; they took me off it" (01/19/2014)  . Tobacco abuse   . Type II diabetes mellitus ([place])     Past Surgical History:  Procedure Laterality Date  . [place] VITRECTOMY WITH 20 GAUGE MVR PORT Left 01/19/2014   Procedure: 25 GAUGE PARS PLANA VITRECTOMY WITH 20 GAUGE MVR PORT; MEMBRAME PEEL; SERUM PATCH; LASER TREATMENT; C3F8;  Surgeon: Hayden Pedro, MD;  Location: [place];  Service: Ophthalmology;  Laterality: Left;  . ABDOMINAL HYSTERECTOMY    . AGILE CAPSULE N/A 08/05/2019   Procedure: AGILE CAPSULE;  Surgeon: Daneil Dolin, MD;  [facility];  Service: Endoscopy;  Laterality: N/A;  7:30am  . APPENDECTOMY    . CARPAL TUNNEL RELEASE Bilateral   . CATARACT EXTRACTION W/PHACO Left 10/21/2013   Procedure: LEFT CATARACT EXTRACTION PHACO AND INTRAOCULAR LENS PLACEMENT (IOC);  Surgeon: Marylynn Pearson, MD;  Location: [place] [facility];  Service: Ophthalmology;  Laterality: Left;  . CHOLECYSTECTOMY    . COLONOSCOPY  05/08/2012   JKK:XFGHWEXH and external hemorrhoids; colonic diverticulosis  . COLONOSCOPY WITH PROPOFOL N/A 02/26/2019   two simple adenomas, diverticulosis,  and internal hemorrhoids.   . ESOPHAGEAL DILATION     "more than 3 times" (01/19/2014)  . ESOPHAGOGASTRODUODENOSCOPY  02/08/11   [place]-Distal esophageal erosion consistent with mild erosive reflux   esophagitis/ Noncritical Schatzki ring, small hiatal hernia otherwise upper/ gastrointestinal tract appeared unremarkable, status post passage  of a Maloney dilation to biopsy disruption of the ring described  .  ESOPHAGOGASTRODUODENOSCOPY  04/2012   2 tandem incomplete distal esophagea rings s/p dilation.   . ESOPHAGOGASTRODUODENOSCOPY N/A 09/16/2014   Dr. Gala Romney: Schatzki ring status post dilation/disruption.  Hiatal hernia.  Marland Kitchen ESOPHAGOGASTRODUODENOSCOPY (EGD) WITH PROPOFOL N/A 12/08/2018   EGD with mild Schatzki ring s/p dilation, small hiatal hernia, otherwise normal  . EYE SURGERY     cataracts, bilateral  . GIVENS CAPSULE STUDY N/A 09/29/2019   Procedure: GIVENS CAPSULE STUDY;  Surgeon: Daneil Dolin, MD;  [facility];  Service: Endoscopy;  Laterality: N/A;  7:30am  . INCONTINENCE SURGERY  08/26/09   [place]  . JOINT REPLACEMENT     right total knee  . MALONEY DILATION N/A 09/16/2014   Procedure: Venia Minks DILATION;  Surgeon: Daneil Dolin, MD;  [facility];  Service: Endoscopy;  Laterality: N/A;  . Venia Minks DILATION N/A 12/08/2018   Procedure: Venia Minks DILATION;  Surgeon: Daneil Dolin, MD;  [facility];  Service: Endoscopy;  Laterality: N/A;  . PARS PLANA VITRECTOMY W/ REPAIR OF MACULAR HOLE Left 01/19/2014  . POLYPECTOMY  02/26/2019   Procedure: POLYPECTOMY;  Surgeon: Danie Binder, MD;  [facility];  Service: Endoscopy;;  ascending colon polyps x2  . SAVORY DILATION N/A 09/16/2014   Procedure: SAVORY DILATION;  Surgeon: Daneil Dolin, MD;  [facility];  Service: Endoscopy;  Laterality: N/A;  . TONSILLECTOMY    . TOTAL KNEE ARTHROPLASTY Right 05/13/07   Dr. Aline Brochure  . TRIGGER FINGER RELEASE Right 06/26/2018   Procedure: RIGHT INDEX FINGER TRIGGER FINGER/A-1 PULLEY RELEASE;  Surgeon: Carole Civil, MD;  Location: [facility];  Service: Orthopedics;  Laterality: Right;    Social History   Socioeconomic History  . Marital status: Single    Spouse name: Not on file  . Number of children: 2  . Years of education: Not on file  . Highest education level: Not on file  Occupational History  . Occupation: disabled    Employer:  RETIRED  Tobacco Use  . Smoking status: Former Smoker    Packs/day: 0.50    Years: 25.00    Pack years: 12.50    Types: Cigarettes    Quit date: 01/01/2004    Years since quitting: 16.5  . Smokeless tobacco: Never Used  Vaping Use  . Vaping Use: Never used  Substance and Sexual Activity  . Alcohol use: No  . Drug use: No  . Sexual activity: Never    Birth control/protection: Surgical  Other Topics Concern  . Not on file  Social History Narrative   Patient lives at home by herself   Patient drinks one cup of coffee a day   Patient is right handed.   Social Determinants of Health   Financial Resource Strain:   . Difficulty of Paying Living Expenses:   Food Insecurity:   . Worried About  Running Out of Food in the Last Year:   . [place] in the Last Year:   Transportation Needs:   . Lack of Transportation (Medical):   Marland Kitchen Lack of Transportation (Non-Medical):   Physical Activity:   . Days of Exercise per Week:   . Minutes of Exercise per Session:   Stress:   . Feeling of Stress :   Social Connections:   . Frequency of Communication with Friends and Family:   . Frequency of Social Gatherings with Friends and Family:   . Attends Religious Services:   . Active Member of Clubs or Organizations:   . Attends Archivist Meetings:   Marland Kitchen Marital Status:   Intimate Partner Violence:   . Fear of Current or Ex-Partner:   . Emotionally Abused:   Marland Kitchen Physically Abused:   . Sexually Abused:    Family History  Problem Relation Age of Onset  . Cancer Mother   . Heart disease Mother   . Cancer Father   . Heart disease Father   . Diabetes Sister   . Hypertension Brother   . Heart disease Brother   . Cancer Sister   . Kidney failure Sister   . Diabetes Son   . Hypertension Son   . Hypertension Daughter   . Colon cancer Neg Hx       VITAL SIGNS BP (!) 144/63   Pulse 64   Temp 98.6 F (37 C) (Oral)   Resp 20   Ht _0  (1.676 m)   Wt 241 lb (109.3 kg)    SpO2 99%   BMI 38.90 kg/m   Outpatient Encounter Medications as of 07/08/2020  Medication Sig  . acetaminophen (TYLENOL) 325 MG tablet Take 2 tablets (650 mg total) by mouth every 6 (six) hours as needed for mild pain, fever or headache (or Fever >/= 101).  . Amino Acids-Protein Hydrolys (FEEDING SUPPLEMENT, PRO-STAT SUGAR FREE 64,) LIQD Take 30 mLs by mouth 3 (three) times daily with meals.  Roseanne Kaufman [place]-Castor Oil (VENELEX) OINT Apply topically in the morning, at noon, and at bedtime. ointment; - ; amt: ribbon; topical  . brimonidine-timolol (COMBIGAN) 0.2-0.5 % ophthalmic solution Place 1 drop into both eyes every 12 (twelve) hours.  . cetirizine (ZYRTEC) 10 MG tablet TAKE 1 TABLET BY MOUTH ONCE A DAY.  Marland Kitchen Cholecalciferol (VITAMIN D3) 2000 units TABS Take 2,000 Units by mouth at bedtime.  . citalopram (CELEXA) 10 MG tablet TAKE 1 TABLET BY MOUTH ONCE A DAY.  Marland Kitchen Cranberry 1000 MG CAPS Take 1,000 mg by mouth daily.  . cycloSPORINE (RESTASIS) 0.05 % ophthalmic emulsion Place 1 drop into both eyes 2 (two) times daily.   Marland Kitchen diltiazem (CARDIZEM CD) 120 MG 24 hr capsule Take 1 capsule (120 mg total) by mouth daily.  Marland Kitchen ezetimibe (ZETIA) 10 MG tablet TAKE 1 TABLET BY MOUTH ONCE A DAY.  . famotidine (PEPCID) 40 MG tablet Take 40 mg by mouth every evening.  . fidaxomicin (DIFICID) 200 MG TABS tablet Take 1 tablet (200 mg total) by mouth 2 (two) times daily.  . fluticasone (FLONASE) 50 MCG/ACT nasal spray PLACE 2 SPRAYS INTO BOTH NOSTRILS DAILY.  Marland Kitchen gabapentin (NEURONTIN) 300 MG capsule TAKE (1) CAPSULE BY MOUTH TWICE DAILY.  Marland Kitchen HYDROcodone-acetaminophen (NORCO) 10-325 MG tablet Take 1 tablet by mouth 2 (two) times daily as needed for severe pain.  . hydrocortisone (ANUSOL-HC) 2.5 % rectal cream Place 1 application rectally 2 (two) times daily.  . insulin glargine (  LANTUS SOLOSTAR) 100 UNIT/ML Solostar Pen Inject 6 Units into the skin at bedtime.   . insulin lispro (HUMALOG KWIKPEN) 100 UNIT/ML KwikPen  insulin pen; 100 unit/mL; amt: Per Sliding Scale; If Blood Sugar is 0 to 149, give 0 Units. If Blood Sugar is greater than 149, give 3 Units. subcutaneous Before Meals  . [START ON 07/11/2020] linagliptin (TRADJENTA) 5 MG TABS tablet Take 1 tablet (5 mg total) by mouth daily.  Marland Kitchen losartan (COZAAR) 25 MG tablet Take 1 tablet (25 mg total) by mouth daily.  . Multiple Vitamin (MULTIVITAMIN WITH MINERALS) TABS tablet Take 1 tablet by mouth at bedtime.  . NON FORMULARY Diet: _____ Regular, ______ NAS, _______Consistent Carbohydrate, _______NPO ___X__Other :SOFT DIET  . nystatin (NYSTATIN) powder Apply 1 application topically daily as needed. Special Instructions: fungal rash to groin and inguinal folds  . ondansetron (ZOFRAN) 4 MG tablet Take 1 tablet (4 mg total) by mouth every 8 (eight) hours as needed for nausea or vomiting.  . predniSONE (DELTASONE) 5 MG tablet Take 1 tablet (5 mg total) by mouth daily.  Marland Kitchen PROAIR HFA 108 (90 Base) MCG/ACT inhaler INHALE 2 PUFFS BY MOUTH EVERY 6 HOURS AS NEEDED .  Marland Kitchen Probiotic Product (RISA-BID PROBIOTIC) TABS Take 1 tablet by mouth 2 (two) times daily.  Marland Kitchen pyridostigmine (MESTINON) 60 MG tablet TAKE (1) TABLET BY MOUTH THREE TIMES A DAY.  . rosuvastatin (CRESTOR) 40 MG tablet TAKE (1) TABLET BY MOUTH AT BEDTIME.  Marland Kitchen solifenacin (VESICARE) 10 MG tablet TAKE 1 TABLET BY MOUTH ONCE A DAY.  Marland Kitchen venlafaxine XR (EFFEXOR-XR) 150 MG 24 hr capsule TAKE 1 CAPSULE BY MOUTH DAILY.   No facility-administered encounter medications on file as of 07/08/2020.     SIGNIFICANT DIAGNOSTIC EXAMS   PREVIOUS   06-12-20: ct of abdomen and pelvis 1. Diffuse circumferential wall thickening of much of the descending colon and sigmoid colon with multiple enlarged adjacent regional lymph nodes. Findings are concerning for an infectious or inflammatory colitis. a follow-up colonoscopy is recommended as an outpatient. 2. Complex cystic mass in the right hemipelvis likely associated with  the right ovary measuring up to 3.9 cm. Follow-up with a nonemergent outpatient pelvic ultrasound is recommended. 3. Large hiatal hernia. 4. Nonobstructive right nephrolithiasis. Aortic Atherosclerosis  NO NEW EXAMS.   LABS REVIEWED PREVIOUS  06-12-20: wbc 17.7; hgb 9.1; hct 29.0; mcv 84.8 plt 560; glucose 250; bun 35; creat 1.68; k+ 3.2; na++ 135; ca 8.2 liver normal albumin 2.3 mag 1.5 phos 3.0 hgb a1c 6.9 06-14-20: wbc 13.9; hgb 8.0; hct 25.1; mcv 84.2 plt 415; glucose 99; bun 22; creat 1.21; k+ 3.2; na++ 135; ca 7.3 06-15-20: + c-diff 06-17-20: wbc 14.8; hgb 8.4; hct 25.7; mcv 83.2 plt 355; glucose 82; bun 16; creat 1.22; k+ 4.5; na++ 135; ca 7.8 phos 2.4 albumin 1.8 mag 1.6 06-20-20: wbc 14.5; hgb 7.5; hct 22.9; mcv 81.2 plt 410; glucose 80; bun 16; creat 1.18; k+ 3.9; na++ 134; ca 7.7 liver normal albumin 1.5 mag 1.8   TODAY  07-03-20: wbc 109; hgb 6.3; hct 20.5; mcv 90.7 plt 491; glucose 105; bun 24; creat 0.78; k+ 3.2; na++ 137; ca 7.8 ast 54; alt 70; alk phos 138; albumin 1.7 07-04-20: hgb 8.9; hct 29.4 07-05-20: wbc 10.7; hgb 8.7; hct 28.7; mcv 92.6 plt 400; glucose 59; bun 17; creat 0.61; k+ 3.9; na++ 140; ca 7.8 mag 1.7  07-06-20: wbc 9.2; hgb 9.2; hct 31.1; mcv 93.7 plt 399     Review of  Systems  Constitutional: Negative for malaise/fatigue.  Respiratory: Negative for cough and shortness of breath.   Cardiovascular: Negative for chest pain, palpitations and leg swelling.  Gastrointestinal: Positive for diarrhea. Negative for abdominal pain, constipation and heartburn.  Musculoskeletal: Negative for back pain, joint pain and myalgias.  Skin: Negative.   Neurological: Negative for dizziness.  Psychiatric/Behavioral: The patient is not nervous/anxious.      Physical Exam Constitutional:      General: She is not in acute distress.    Appearance: She is well-developed. She is morbidly obese. She is not diaphoretic.  Neck:     Thyroid: No thyromegaly.  Cardiovascular:     Rate  and Rhythm: Normal rate and regular rhythm.     Pulses: Normal pulses.     Heart sounds: Normal heart sounds.  Pulmonary:     Effort: Pulmonary effort is normal. No respiratory distress.     Breath sounds: Normal breath sounds.  Abdominal:     General: Bowel sounds are normal. There is no distension.     Palpations: Abdomen is soft.     Tenderness: There is no abdominal tenderness.  Musculoskeletal:        General: Normal range of motion.     Cervical back: Neck supple.     Right lower leg: No edema.     Left lower leg: No edema.  Lymphadenopathy:     Cervical: No cervical adenopathy.  Skin:    General: Skin is warm and dry.  Neurological:     Mental Status: She is alert and oriented to person, place, and time.  Psychiatric:        Mood and Affect: Mood normal.     ASSESSMENT/ PLAN:  TODAY  1. C. Difficile colitis: is without change continues to have loose stools; has completed vancomycin will continue dificid 200 mg twice daily  2.diabetic peripheral neuropathy associated with type 2 diabetes mellitus: is having worsening left leg pain; will conitnue gabapentin  300 mg twice daily and will monitor her status; has vicodin 10/325 mg twice daily as needed    3. Hypertension associated with stage 3 chronic kidney disease due to type 2 diabetes mellitus is stable b/p 144/63 will continue cozaar 25 mg daily cardizem cd 120 mg daily   4. Obstructive sleep apnea is stable will continue CPAP nightly   5. Chronic non-seasonal allergic rhinitis: is stable will continue flonase daily and zyrtec 10 mg daily   6. Gastroesophageal reflux disease without esophagitis: is stable is off PPI will continue pepcid 40 mg daily   7. Type 2 diabetes mellitus with diabetic neuropathy with long term current use of insulin: is stable hgb a1c 6.9 will continue lantus 6 units nightly and humalog 3 units with meals on 07-11-20 will start tradjenta 5 mg daily   8. Hyperlipidemia associated with type 2  diabetes mellitus is stable will continue crestor 40 mg daily zetia 10 mg daily   9. Myasthenia gravis in remission is stable will continue mestinon 60 mg three times daily prednisone 5 mg daily   10. Depression with anxiety is stable will continue effexor xr 150 mg daily celexa 10 mg daily   11. Urinary urgency is stable will continue vesicare 10 mg daily   12. Pelvic mass in female: right side is due for pelvic ultrasound  13. Acute blood anemia: stable hgb 9.2; will continue to monitor her status.   14. Chronic obstructive pulmonary disease unspecified COPD type: is stable will continue albuterol 2  puffs every 6 hours as needed  15. Increased intraocular pressure bilateral is stable will continue combigan to both eyes twice daily   16. Chronic constipation is off linzess due to c-diff  17. Protein calorie malnutrition severe: albumin 1.5 will continue prostat 30 ml three times daily       MD is aware of resident's narcotic use and is in agreement with current plan of care. We will attempt to wean resident as appropriate.  Ok Edwards NP [facility]  Contact [phone] Monday through Friday 8am- 5pm  After hours call [phone]

## 2020-07-08 NOTE — Telephone Encounter (Signed)
Received a call from [place] at the [facility]. Pt has been discharged from the hospital. Joelene Millin is aware of the CBC needed in 1 week. I'm faxing order to [phone]. Joelene Millin was asked to schedule an appointment with our office per hospital discharge summary. Pt has an apt 09/09/20 with [facility]. Would you like pt seen sooner? Please advise.

## 2020-07-08 NOTE — Telephone Encounter (Signed)
[place] OR A NURSE ABOUT LABS

## 2020-07-11 ENCOUNTER — Encounter: Payer: Self-pay | Admitting: Adult Health

## 2020-07-11 ENCOUNTER — Non-Acute Institutional Stay (SKILLED_NURSING_FACILITY): Payer: Medicare Other | Admitting: Adult Health

## 2020-07-11 ENCOUNTER — Encounter (HOSPITAL_COMMUNITY)
Admission: RE | Admit: 2020-07-11 | Discharge: 2020-07-11 | Disposition: A | Discharge status: Home / Self Care | Payer: Medicare Other | Source: Skilled Nursing Facility | Attending: Adult Health | Admitting: Adult Health

## 2020-07-11 DIAGNOSIS — A0472 Enterocolitis due to Clostridium difficile, not specified as recurrent: Secondary | ICD-10-CM

## 2020-07-11 DIAGNOSIS — K625 Hemorrhage of anus and rectum: Secondary | ICD-10-CM | POA: Diagnosis not present

## 2020-07-11 LAB — CBC WITH DIFFERENTIAL/PLATELET
Abs Immature Granulocytes: 0.05 10*3/uL (ref 0.00–0.07)
Basophils Absolute: 0 10*3/uL (ref 0.0–0.1)
Basophils Relative: 0 %
Eosinophils Absolute: 0.2 10*3/uL (ref 0.0–0.5)
Eosinophils Relative: 2 %
HCT: 28.9 % — ABNORMAL LOW (ref 36.0–46.0)
Hemoglobin: 8.9 g/dL — ABNORMAL LOW (ref 12.0–15.0)
Immature Granulocytes: 1 %
Lymphocytes Relative: 18 %
Lymphs Abs: 1.9 10*3/uL (ref 0.7–4.0)
MCH: 28 pg (ref 26.0–34.0)
MCHC: 30.8 g/dL (ref 30.0–36.0)
MCV: 90.9 fL (ref 80.0–100.0)
Monocytes Absolute: 0.9 10*3/uL (ref 0.1–1.0)
Monocytes Relative: 9 %
Neutro Abs: 7.6 10*3/uL (ref 1.7–7.7)
Neutrophils Relative %: 70 %
Platelets: 371 10*3/uL (ref 150–400)
RBC: 3.18 MIL/uL — ABNORMAL LOW (ref 3.87–5.11)
RDW: 18.9 % — ABNORMAL HIGH (ref 11.5–15.5)
WBC: 10.6 10*3/uL — ABNORMAL HIGH (ref 4.0–10.5)
nRBC: 0 % (ref 0.0–0.2)

## 2020-07-11 NOTE — Progress Notes (Signed)
Location:    [facility] Room Number: 153/P Place of Service:  SNF (31)   CODE STATUS: Full Code  No Known Allergies  Chief Complaint  Patient presents with  . Acute Visit    Blood in stool    HPI:  She continues to have rectal bleeding the blood is dark in color. Her stools are not formed; she continues to have left lower quad pain present. No nausea or vomiting present. No report of fevers present.   Past Medical History:  Diagnosis Date  . Anemia in chronic renal disease 12/30/2015  . Anxiety   . Arthritis    "all over" (01/19/2014)  . Arthritis, lumbar spine   . Carpal tunnel syndrome   . Chronic bronchitis ([place])    "got it q yr for awhile; hasn't had it in awhile" (01/19/2014)  . Chronic kidney disease (CKD) stage G3b/A1, moderately decreased glomerular filtration rate (GFR) between 30-44 mL/min/1.73 square meter and albuminuria creatinine ratio less than 30 mg/g 09/14/2009   Qualifier: Diagnosis of  By: Moshe Cipro MD, Joycelyn Schmid    . Chronic renal disease, stage 3, moderately decreased glomerular filtration rate (GFR) between 30-59 mL/min/1.73 square meter 09/14/2009   Qualifier: Diagnosis of  By: Moshe Cipro MD, Joycelyn Schmid    . Complication of anesthesia    combative  . COPD (chronic obstructive pulmonary disease) ([place])   . Depression   . Diverticulosis 07/2004   Colonscopy Dr Gala Romney  . Esophagitis, erosive 2009  . Exertional shortness of breath   . Gastroesophageal reflux   . OHYWVPXT(062.6)    "usually a couple times/wk" (01/19/2014)  . Heart murmur    saw cardiology In [place], he told her she did not need to come back.  . Hyperlipidemia   . Hypertension   . Impingement syndrome, shoulder   . Iron deficiency anemia   . Mitral regurgitation   . Myasthenia gravis   . Myasthenia gravis in remission ([place]) 11/24/2014  . Myasthenia gravis in remission ([place])   . Obesity   . OSA on CPAP    Negative on last sleep study  . Pneumonia 01/2012  . Pulmonary  HTN ([place])   . Schatzki's ring    Last EGD w/ dilation 02/08/11, 2009 & 2007  . Seasonal allergies   . Shoulder pain   . Thyroid disease    "used to take RX; they took me off it" (01/19/2014)  . Tobacco abuse   . Type II diabetes mellitus ([place])     Past Surgical History:  Procedure Laterality Date  . [place] VITRECTOMY WITH 20 GAUGE MVR PORT Left 01/19/2014   Procedure: 25 GAUGE PARS PLANA VITRECTOMY WITH 20 GAUGE MVR PORT; MEMBRAME PEEL; SERUM PATCH; LASER TREATMENT; C3F8;  Surgeon: Hayden Pedro, MD;  Location: [facility];  Service: Ophthalmology;  Laterality: Left;  . ABDOMINAL HYSTERECTOMY    . AGILE CAPSULE N/A 08/05/2019   Procedure: AGILE CAPSULE;  Surgeon: Daneil Dolin, MD;  [facility];  Service: Endoscopy;  Laterality: N/A;  7:30am  . APPENDECTOMY    . CARPAL TUNNEL RELEASE Bilateral   . CATARACT EXTRACTION W/PHACO Left 10/21/2013   Procedure: LEFT CATARACT EXTRACTION PHACO AND INTRAOCULAR LENS PLACEMENT (IOC);  Surgeon: Marylynn Pearson, MD;  Location: [facility];  Service: Ophthalmology;  Laterality: Left;  . CHOLECYSTECTOMY    . COLONOSCOPY  05/08/2012   RSW:NIOEVOJJ and external hemorrhoids; colonic diverticulosis  . COLONOSCOPY WITH PROPOFOL N/A 02/26/2019   two simple adenomas, diverticulosis,  and internal hemorrhoids.   . ESOPHAGEAL DILATION     "more than 3 times" (01/19/2014)  . ESOPHAGOGASTRODUODENOSCOPY  02/08/11   [place]-Distal esophageal erosion consistent with mild erosive reflux   esophagitis/ Noncritical Schatzki ring, small hiatal hernia otherwise upper/ gastrointestinal tract appeared unremarkable, status post passage  of a Maloney dilation to biopsy disruption of the ring described  . ESOPHAGOGASTRODUODENOSCOPY  04/2012   2 tandem incomplete distal esophagea rings s/p dilation.   . ESOPHAGOGASTRODUODENOSCOPY N/A 09/16/2014   Dr. Gala Romney: Schatzki ring status post dilation/disruption.  Hiatal hernia.  Marland Kitchen ESOPHAGOGASTRODUODENOSCOPY (EGD) WITH PROPOFOL N/A  12/08/2018   EGD with mild Schatzki ring s/p dilation, small hiatal hernia, otherwise normal  . EYE SURGERY     cataracts, bilateral  . GIVENS CAPSULE STUDY N/A 09/29/2019   Procedure: GIVENS CAPSULE STUDY;  Surgeon: Daneil Dolin, MD;  [facility];  Service: Endoscopy;  Laterality: N/A;  7:30am  . INCONTINENCE SURGERY  08/26/09   [place]  . JOINT REPLACEMENT     right total knee  . MALONEY DILATION N/A 09/16/2014   Procedure: Venia Minks DILATION;  Surgeon: Daneil Dolin, MD;  [facility];  Service: Endoscopy;  Laterality: N/A;  . Venia Minks DILATION N/A 12/08/2018   Procedure: Venia Minks DILATION;  Surgeon: Daneil Dolin, MD;  [facility];  Service: Endoscopy;  Laterality: N/A;  . PARS PLANA VITRECTOMY W/ REPAIR OF MACULAR HOLE Left 01/19/2014  . POLYPECTOMY  02/26/2019   Procedure: POLYPECTOMY;  Surgeon: Danie Binder, MD;  [facility];  Service: Endoscopy;;  ascending colon polyps x2  . SAVORY DILATION N/A 09/16/2014   Procedure: SAVORY DILATION;  Surgeon: Daneil Dolin, MD;  [facility];  Service: Endoscopy;  Laterality: N/A;  . TONSILLECTOMY    . TOTAL KNEE ARTHROPLASTY Right 05/13/07   Dr. Aline Brochure  . TRIGGER FINGER RELEASE Right 06/26/2018   Procedure: RIGHT INDEX FINGER TRIGGER FINGER/A-1 PULLEY RELEASE;  Surgeon: Carole Civil, MD;  Location: [facility];  Service: Orthopedics;  Laterality: Right;    Social History   Socioeconomic History  . Marital status: Single    Spouse name: Not on file  . Number of children: 2  . Years of education: Not on file  . Highest education level: Not on file  Occupational History  . Occupation: disabled    Employer: RETIRED  Tobacco Use  . Smoking status: Former Smoker    Packs/day: 0.50    Years: 25.00    Pack years: 12.50    Types: Cigarettes    Quit date: 01/01/2004    Years since quitting: 16.5  . Smokeless tobacco: Never Used  Vaping Use  . Vaping Use: Never used    Substance and Sexual Activity  . Alcohol use: No  . Drug use: No  . Sexual activity: Never    Birth control/protection: Surgical  Other Topics Concern  . Not on file  Social History Narrative   Patient lives at home by herself   Patient drinks one cup of coffee a day   Patient is right handed.   Social Determinants of Health   Financial Resource Strain:   . Difficulty of Paying Living Expenses:   Food Insecurity:   . Worried About Charity fundraiser in the Last Year:   . Arboriculturist in the Last Year:   Transportation Needs:   . Film/video editor (Medical):   Marland Kitchen Lack of Transportation (Non-Medical):   Physical  Activity:   . Days of Exercise per Week:   . Minutes of Exercise per Session:   Stress:   . Feeling of Stress :   Social Connections:   . Frequency of Communication with Friends and Family:   . Frequency of Social Gatherings with Friends and Family:   . Attends Religious Services:   . Active Member of Clubs or Organizations:   . Attends Archivist Meetings:   Marland Kitchen Marital Status:   Intimate Partner Violence:   . Fear of Current or Ex-Partner:   . Emotionally Abused:   Marland Kitchen Physically Abused:   . Sexually Abused:    Family History  Problem Relation Age of Onset  . Cancer Mother   . Heart disease Mother   . Cancer Father   . Heart disease Father   . Diabetes Sister   . Hypertension Brother   . Heart disease Brother   . Cancer Sister   . Kidney failure Sister   . Diabetes Son   . Hypertension Son   . Hypertension Daughter   . Colon cancer Neg Hx       VITAL SIGNS BP 133/64   Pulse 61   Temp 98.2 F (36.8 C) (Oral)   Resp 20   Ht '5\' 6"'  (1.676 m)   Wt 248 lb 11.2 oz (112.8 kg)   SpO2 99%   BMI 40.14 kg/m   Outpatient Encounter Medications as of 07/11/2020  Medication Sig  . acetaminophen (TYLENOL) 325 MG tablet Take 2 tablets (650 mg total) by mouth every 6 (six) hours as needed for mild pain, fever or headache (or Fever >/= 101).   . Amino Acids-Protein Hydrolys (FEEDING SUPPLEMENT, PRO-STAT SUGAR FREE 64,) LIQD Take 30 mLs by mouth 3 (three) times daily with meals.  Roseanne Kaufman Peru-Castor Oil (VENELEX) OINT Apply topically in the morning, at noon, and at bedtime. ointment; - ; amt: ribbon; topical  . brimonidine-timolol (COMBIGAN) 0.2-0.5 % ophthalmic solution Place 1 drop into both eyes every 12 (twelve) hours.  . cetirizine (ZYRTEC) 10 MG tablet TAKE 1 TABLET BY MOUTH ONCE A DAY.  Marland Kitchen Cholecalciferol (VITAMIN D3) 2000 units TABS Take 2,000 Units by mouth at bedtime.  . citalopram (CELEXA) 10 MG tablet TAKE 1 TABLET BY MOUTH ONCE A DAY.  Marland Kitchen Cranberry 1000 MG CAPS Take 1,000 mg by mouth daily.  . cycloSPORINE (RESTASIS) 0.05 % ophthalmic emulsion Place 1 drop into both eyes 2 (two) times daily.   Marland Kitchen diltiazem (CARDIZEM CD) 120 MG 24 hr capsule Take 1 capsule (120 mg total) by mouth daily.  Marland Kitchen ezetimibe (ZETIA) 10 MG tablet TAKE 1 TABLET BY MOUTH ONCE A DAY.  . famotidine (PEPCID) 40 MG tablet Take 40 mg by mouth every evening.  . fidaxomicin (DIFICID) 200 MG TABS tablet Take 1 tablet (200 mg total) by mouth 2 (two) times daily.  . fluticasone (FLONASE) 50 MCG/ACT nasal spray PLACE 2 SPRAYS INTO BOTH NOSTRILS DAILY.  Marland Kitchen gabapentin (NEURONTIN) 300 MG capsule TAKE (1) CAPSULE BY MOUTH TWICE DAILY.  Marland Kitchen HYDROcodone-acetaminophen (NORCO) 10-325 MG tablet Take 1 tablet by mouth 2 (two) times daily as needed for up to 5 days for severe pain.  . hydrocortisone (ANUSOL-HC) 2.5 % rectal cream Place 1 application rectally 2 (two) times daily.  . insulin glargine (LANTUS SOLOSTAR) 100 UNIT/ML Solostar Pen Inject 6 Units into the skin at bedtime.   . insulin lispro (HUMALOG KWIKPEN) 100 UNIT/ML KwikPen insulin pen; 100 unit/mL; amt: Per Sliding Scale; If Blood  Sugar is 0 to 149, give 0 Units. If Blood Sugar is greater than 149, give 3 Units. subcutaneous Before Meals  . linagliptin (TRADJENTA) 5 MG TABS tablet Take 1 tablet (5 mg total) by  mouth daily.  Marland Kitchen losartan (COZAAR) 25 MG tablet Take 1 tablet (25 mg total) by mouth daily.  . Menthol, Topical Analgesic, (BIOFREEZE) 4 % GEL Apply topically. Apply a small amont; topically to her lower legs and knees  . Multiple Vitamin (MULTIVITAMIN WITH MINERALS) TABS tablet Take 1 tablet by mouth at bedtime.  . NON FORMULARY Diet: _____ Regular, ______ NAS, _______Consistent Carbohydrate, _______NPO ___X__Other :SOFT DIET  . nystatin (NYSTATIN) powder Apply 1 application topically daily as needed. Special Instructions: fungal rash to groin and inguinal folds  . ondansetron (ZOFRAN) 4 MG tablet Take 1 tablet (4 mg total) by mouth every 8 (eight) hours as needed for nausea or vomiting.  . predniSONE (DELTASONE) 5 MG tablet Take 1 tablet (5 mg total) by mouth daily.  Marland Kitchen PROAIR HFA 108 (90 Base) MCG/ACT inhaler INHALE 2 PUFFS BY MOUTH EVERY 6 HOURS AS NEEDED .  Marland Kitchen Probiotic Product (RISA-BID PROBIOTIC) TABS Take 1 tablet by mouth 2 (two) times daily.  Marland Kitchen pyridostigmine (MESTINON) 60 MG tablet TAKE (1) TABLET BY MOUTH THREE TIMES A DAY.  . rosuvastatin (CRESTOR) 40 MG tablet TAKE (1) TABLET BY MOUTH AT BEDTIME.  Marland Kitchen solifenacin (VESICARE) 10 MG tablet TAKE 1 TABLET BY MOUTH ONCE A DAY.  Marland Kitchen venlafaxine XR (EFFEXOR-XR) 150 MG 24 hr capsule TAKE 1 CAPSULE BY MOUTH DAILY.   No facility-administered encounter medications on file as of 07/11/2020.     SIGNIFICANT DIAGNOSTIC EXAMS   PREVIOUS   06-12-20: ct of abdomen and pelvis 1. Diffuse circumferential wall thickening of much of the descending colon and sigmoid colon with multiple enlarged adjacent regional lymph nodes. Findings are concerning for an infectious or inflammatory colitis. a follow-up colonoscopy is recommended as an outpatient. 2. Complex cystic mass in the right hemipelvis likely associated with the right ovary measuring up to 3.9 cm. Follow-up with a nonemergent outpatient pelvic ultrasound is recommended. 3. Large hiatal  hernia. 4. Nonobstructive right nephrolithiasis. Aortic Atherosclerosis  NO NEW EXAMS.   LABS REVIEWED PREVIOUS  06-12-20: wbc 17.7; hgb 9.1; hct 29.0; mcv 84.8 plt 560; glucose 250; bun 35; creat 1.68; k+ 3.2; na++ 135; ca 8.2 liver normal albumin 2.3 mag 1.5 phos 3.0 hgb a1c 6.9 06-14-20: wbc 13.9; hgb 8.0; hct 25.1; mcv 84.2 plt 415; glucose 99; bun 22; creat 1.21; k+ 3.2; na++ 135; ca 7.3 06-15-20: + c-diff 06-17-20: wbc 14.8; hgb 8.4; hct 25.7; mcv 83.2 plt 355; glucose 82; bun 16; creat 1.22; k+ 4.5; na++ 135; ca 7.8 phos 2.4 albumin 1.8 mag 1.6 06-20-20: wbc 14.5; hgb 7.5; hct 22.9; mcv 81.2 plt 410; glucose 80; bun 16; creat 1.18; k+ 3.9; na++ 134; ca 7.7 liver normal albumin 1.5 mag 1.8  07-03-20: wbc 109; hgb 6.3; hct 20.5; mcv 90.7 plt 491; glucose 105; bun 24; creat 0.78; k+ 3.2; na++ 137; ca 7.8 ast 54; alt 70; alk phos 138; albumin 1.7 07-04-20: hgb 8.9; hct 29.4 07-05-20: wbc 10.7; hgb 8.7; hct 28.7; mcv 92.6 plt 400; glucose 59; bun 17; creat 0.61; k+ 3.9; na++ 140; ca 7.8 mag 1.7  07-06-20: wbc 9.2; hgb 9.2; hct 31.1; mcv 93.7 plt 399   TODAY  07-11-20: wbc 10.6; hgb 8.9; hct 28.9; mcv 90.9 plt 371  Review of Systems  Constitutional: Negative for malaise/fatigue.  Respiratory: Negative for cough and shortness of breath.   Cardiovascular: Negative for chest pain, palpitations and leg swelling.  Gastrointestinal: Positive for abdominal pain, blood in stool and diarrhea. Negative for heartburn.  Musculoskeletal: Negative for back pain, joint pain and myalgias.  Skin: Negative.   Neurological: Negative for dizziness.  Psychiatric/Behavioral: The patient is not nervous/anxious.    Physical Exam Constitutional:      General: She is not in acute distress.    Appearance: She is well-developed. She is morbidly obese. She is not diaphoretic.  Neck:     Thyroid: No thyromegaly.  Cardiovascular:     Rate and Rhythm: Normal rate and regular rhythm.     Pulses: Normal pulses.     Heart  sounds: Normal heart sounds.  Pulmonary:     Effort: Pulmonary effort is normal. No respiratory distress.     Breath sounds: Normal breath sounds.  Abdominal:     General: Bowel sounds are normal. There is no distension.     Palpations: Abdomen is soft.     Tenderness: There is no abdominal tenderness.  Musculoskeletal:        General: Normal range of motion.     Cervical back: Neck supple.     Right lower leg: No edema.     Left lower leg: No edema.  Lymphadenopathy:     Cervical: No cervical adenopathy.  Skin:    General: Skin is warm and dry.  Neurological:     Mental Status: She is alert and oriented to person, place, and time.  Psychiatric:        Mood and Affect: Mood normal.       ASSESSMENT/ PLAN:  TODAY  1.  Rectal bleeding 2. Enteritis due to clostridium difficile   Her hgb is presently stable Will have her return back to GI in the AM She completes dificid today  Will monitor her status.      MD is aware of resident's narcotic use and is in agreement with current plan of care. We will attempt to wean resident as appropriate.  Ok Edwards NP [facility]  Contact [phone] Monday through Friday 8am- 5pm  After hours call [phone]

## 2020-07-11 NOTE — Telephone Encounter (Signed)
Pts apt was moved from 08/2020 to 07/28/20 @ 10:30 AM.

## 2020-07-11 NOTE — Telephone Encounter (Signed)
Spoke with [name]. Pts apt was moved up to 07/28/20 @ 10:30 AM. Pts apt for 08/2020 was cancelled.

## 2020-07-12 ENCOUNTER — Non-Acute Institutional Stay (SKILLED_NURSING_FACILITY): Payer: Medicare Other | Admitting: Internal Medicine

## 2020-07-12 ENCOUNTER — Encounter: Payer: Self-pay | Admitting: Gastroenterology

## 2020-07-12 ENCOUNTER — Encounter: Payer: Self-pay | Admitting: Internal Medicine

## 2020-07-12 ENCOUNTER — Ambulatory Visit: Payer: Medicare Other | Admitting: Gastroenterology

## 2020-07-12 VITALS — BP 138/69 | HR 77 | Temp 98.8°F | Ht 66.0 in

## 2020-07-12 DIAGNOSIS — N183 Chronic kidney disease, stage 3 unspecified: Secondary | ICD-10-CM | POA: Diagnosis not present

## 2020-07-12 DIAGNOSIS — E1122 Type 2 diabetes mellitus with diabetic chronic kidney disease: Secondary | ICD-10-CM

## 2020-07-12 DIAGNOSIS — A0472 Enterocolitis due to Clostridium difficile, not specified as recurrent: Secondary | ICD-10-CM | POA: Diagnosis not present

## 2020-07-12 DIAGNOSIS — R7989 Other specified abnormal findings of blood chemistry: Secondary | ICD-10-CM

## 2020-07-12 DIAGNOSIS — I129 Hypertensive chronic kidney disease with stage 1 through stage 4 chronic kidney disease, or unspecified chronic kidney disease: Secondary | ICD-10-CM | POA: Diagnosis not present

## 2020-07-12 DIAGNOSIS — D500 Iron deficiency anemia secondary to blood loss (chronic): Secondary | ICD-10-CM

## 2020-07-12 DIAGNOSIS — D62 Acute posthemorrhagic anemia: Secondary | ICD-10-CM

## 2020-07-12 NOTE — Assessment & Plan Note (Signed)
Patient describes 4 slightly more formed stool overnight with intermittent left lower quadrant sharp pain.  Blood intermixed with stool. Outpatient GI appointment today. Despite history of diverticulosis; clinical course does not suggest diverticular bleed.

## 2020-07-12 NOTE — Assessment & Plan Note (Addendum)
Despite hemoglobin as low as 5.9; not hypotensive or symptomatic. BP controlled; no change in antihypertensive medications

## 2020-07-12 NOTE — Progress Notes (Signed)
NURSING HOME LOCATION:  [facility] ROOM NUMBER:  153/P  CODE STATUS: Full Code   PCP:  Fayrene Helper, MD   This is a readmission note to [facility] performed on this date less than 30 days from that date of readmission. Included are preadmission medical/surgical history; reconciled medication list; family history; social history and comprehensive review of systems.  Corrections and additions to the records were documented. Comprehensive physical exam was also performed. Additionally a clinical summary was entered for each active diagnosis pertinent to this admission in the Problem List to enhance continuity of care.  HPI: Patient was hospitalized 7/4-07/07/2020 for severe anemia in the context of C. difficile colitis with positive FOBs. She had recently been hospitalized for C. difficile colitis 6/12-6/21/2021.  During that admission she was found to be anemic with hemoglobin of 7.5.  An incidental finding were bilateral pelvic masses, probably involving the ovaries. She was discharged to this facility but was readmitted because of hemoglobin of 6.3 in the context of recurrent bloody diarrhea despite having completed a course of vancomycin.  Dificid 200 mg twice daily had been initiated 7/2 by GI at an outpatient follow-up visit. Despite the profound anemia she was not hypotensive or exhibiting clinical distress.  Stool was heme positive; repeat hemoglobin was 5.9.  2 units of PRBCs were administered. Clinically she she was stable posttransfusion without active bleeding. At discharge he was planned to continue the Difcid through 7/12.  Outpatient GYN assessment of complex cystic mass on her ovaries is to be completed following discharge from the SNF.  Past medical and surgical history: Includes history of abnormal TFTs, seasonal allergies, OSA previously on CPAP, myasthenia gravis in remission, essential hypertension, dyslipidemia, GERD, with Schatzki's ring and  history of erosive esophagitis, diverticulosis, COPD, diabetes with nephropathy, and history of anxiety/depression. Surgeries and procedures include hysterectomy, carpal tunnel release, cholecystectomy, colonic polypectomy, and esophageal dilation.  Social history: Nondrinker, former smoker.  Family history: Extensive history reviewed.  Strong family history of malignancy.   Review of systems: When asked how she was doing she said "not too good".  She states that her diarrhea has "come back with a vengeance".  Since last night she has had 4 stools which were "sort of solid" with some intermixed blood.  She describes occasional "sorta sharp" left lower quadrant abdominal pain. She describes intermittent numbness her lower extremities. She also describes itching "deep in my ears".  Constitutional: No fever, significant weight change  Eyes: No redness, discharge, pain, vision change ENT/mouth: No nasal congestion, purulent discharge, earache, change in hearing  Cardiovascular: No chest pain, palpitations, paroxysmal nocturnal dyspnea, claudication, edema  Respiratory: No hemoptysis, significant snoring, apnea.  She is no longer on CPAP.  Gastrointestinal: No heartburn, dysphagia,  nausea /vomiting,  melena Genitourinary: No dysuria, hematuria, pyuria, incontinence, nocturia Musculoskeletal: No joint stiffness, joint swelling, weakness, pain Dermatologic: No rash, pruritus, change in appearance of skin Neurologic: No dizziness, headache, syncope, seizures Endocrine: No change in hair/skin/nails, excessive thirst, excessive hunger, excessive urination  Hematologic/lymphatic: No significant bruising, lymphadenopathy Allergy/immunology: No itchy/watery eyes, significant sneezing, urticaria, angioedema  Physical exam:  Pertinent or positive findings: She is morbidly obese.  Affect is markedly flat.  Bilateral ptosis is present.  There is intermittent superior elevation of the left eye.  Slight  hyperpigmentation is present over the lateral malar areas.  She is not wearing her upper plate.  There is slight hirsutism over the chin.  A very brief systolic murmur is  suggested.  First heart sound is increased.  Abdomen is protuberant.  She has tense edema over the lower extremities.  Pedal pulses are decreased.  A right shin wound is dressed.  She is weak to opposition in all extremities.  General appearance: Adequately nourished; no acute distress, increased work of breathing is present.   Lymphatic: No lymphadenopathy about the head, neck, axilla. Eyes: No conjunctival inflammation or lid edema is present. There is no scleral icterus. Ears:  External ear exam shows no significant lesions or deformities.   Nose:  External nasal examination shows no deformity or inflammation. Nasal mucosa are pink and moist without lesions, exudates Oral exam: Lips and gums are healthy appearing.There is no oropharyngeal erythema or exudate despite her complaints of sore throat. Neck:  No thyromegaly, masses, tenderness noted.    Heart:  Normal rate and regular rhythm. S2 normal without gallop, click, rub.  Lungs: Chest clear to auscultation without wheezes, rhonchi, rales, rubs. Abdomen: Bowel sounds are normal.  Abdomen is soft and nontender with no organomegaly, hernias, masses. GU: Deferred  Extremities:  No cyanosis, clubbing. Neurologic exam: Balance, Rhomberg, finger to nose testing could not be completed due to clinical state Skin: Warm & dry w/o tenting. No significant rash.  See clinical summary under each active problem in the Problem List with associated updated therapeutic plan

## 2020-07-12 NOTE — Progress Notes (Signed)
Referring Provider: Fayrene Helper, MD Primary Care Physician:  Fayrene Helper, MD Primary GI: Dr. Gala Romney   Chief Complaint  Patient presents with  . Diarrhea    HPI:   [name] is a 79 y.o. female presenting today in hospital follow-up due to acute on chronic anemia, low-volume hematochezia in setting of C. Difficile colitis, initially diagnosed June 16th (positive antigen, negative toxin, reflex PCR positive). Completed course of vancomycin with marginal improvement but then recurrence of symptoms after completion of antibiotics. Dificid starting while inpatient and was to continue for a total of 10 days.   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 Needs diagnostic colonoscopy once recovered from acute illness. Received 2 units PRBCs while inpatient. Hgb 8.9 yesterday, stable from hospitalization overall.   Right ovarian mass CT June 2021 with [place] as outpatient completed but limited evaluation: will need MRI.   Dificid course completed yesterday.    Yesterday morning bloody stool, large amount. Over weekend, stool was lumpy. Yesterday loose in the morning. In afternoon loose, not watery. Light pink in commode, grainy. Per patient 7 BMs today. Alternating between loose and runny. With formed pieces. LLQ pain, intermittently, cramping, usually with eating. Had noticed improvement in frequency after discharge. Was better Saturday and Sunday, noted as clumpy. Possible dairy ingestion: cereal recently. Patient is a difficult historian.   Going home Thursday from [facility] tentatively.    Past Medical History:  Diagnosis Date  . Anemia in chronic renal disease 12/30/2015  . Anxiety   . Arthritis    "all over" (01/19/2014)  . Arthritis, lumbar spine   . Carpal tunnel syndrome   . Chronic bronchitis ([place])    "got it q yr for awhile; hasn't had it in awhile" (01/19/2014)  . Chronic kidney disease (CKD) stage G3b/A1, moderately decreased  glomerular filtration rate (GFR) between 30-44 mL/min/1.73 square meter and albuminuria creatinine ratio less than 30 mg/g 09/14/2009   Qualifier: Diagnosis of  By: Moshe Cipro MD, Joycelyn Schmid    . Complication of anesthesia    combative  . COPD (chronic obstructive pulmonary disease) ([place])   . Depression   . Diverticulosis 07/2004   Colonscopy Dr Gala Romney  . Esophagitis, erosive 2009  . Exertional shortness of breath   . Gastroesophageal reflux   . ZOXWRUEA(540.9)    "usually a couple times/wk" (01/19/2014)  . Heart murmur    saw cardiology In [place], he told her she did not need to come back.  . Hyperlipidemia   . Hypertension   . Impingement syndrome, shoulder   . Iron deficiency anemia   . Mitral regurgitation   . Myasthenia gravis   . Myasthenia gravis in remission ([place]) 11/24/2014  . Obesity   . OSA on CPAP    Negative on last sleep study  . Pneumonia 01/2012  . Pulmonary HTN ([place])   . Schatzki's ring    Last EGD w/ dilation 02/08/11, 2009 & 2007  . Seasonal allergies   . Shoulder pain   . Thyroid disease    "used to take RX; they took me off it" (01/19/2014)  . Tobacco abuse   . Type II diabetes mellitus ([place])     Past Surgical History:  Procedure Laterality Date  . [place] VITRECTOMY WITH 20 GAUGE MVR PORT Left 01/19/2014   Procedure: 25 GAUGE PARS PLANA VITRECTOMY WITH 20 GAUGE MVR PORT; MEMBRAME PEEL; SERUM PATCH; LASER TREATMENT; C3F8;  Surgeon: Jenny Reichmann  Eber Jones, MD;  Location: [facility];  Service: Ophthalmology;  Laterality: Left;  . ABDOMINAL HYSTERECTOMY    . AGILE CAPSULE N/A 08/05/2019   Procedure: AGILE CAPSULE;  Surgeon: Daneil Dolin, MD;  [facility];  Service: Endoscopy;  Laterality: N/A;  7:30am  . APPENDECTOMY    . CARPAL TUNNEL RELEASE Bilateral   . CATARACT EXTRACTION W/PHACO Left 10/21/2013   Procedure: LEFT CATARACT EXTRACTION PHACO AND INTRAOCULAR LENS PLACEMENT (IOC);  Surgeon: Marylynn Pearson, MD;  Location: [facility];  Service: Ophthalmology;   Laterality: Left;  . CHOLECYSTECTOMY    . COLONOSCOPY  05/08/2012   WIO:[id number] and external hemorrhoids; colonic diverticulosis  . COLONOSCOPY WITH PROPOFOL N/A 02/26/2019   two simple adenomas, diverticulosis,  and internal hemorrhoids.   . ESOPHAGEAL DILATION     "more than 3 times" (01/19/2014)  . ESOPHAGOGASTRODUODENOSCOPY  02/08/11   [place]-Distal esophageal erosion consistent with mild erosive reflux   esophagitis/ Noncritical Schatzki ring, small hiatal hernia otherwise upper/ gastrointestinal tract appeared unremarkable, status post passage  of a Maloney dilation to biopsy disruption of the ring described  . ESOPHAGOGASTRODUODENOSCOPY  04/2012   2 tandem incomplete distal esophagea rings s/p dilation.   . ESOPHAGOGASTRODUODENOSCOPY N/A 09/16/2014   Dr. Gala Romney: Schatzki ring status post dilation/disruption.  Hiatal hernia.  Marland Kitchen ESOPHAGOGASTRODUODENOSCOPY (EGD) WITH PROPOFOL N/A 12/08/2018   EGD with mild Schatzki ring s/p dilation, small hiatal hernia, otherwise normal  . EYE SURGERY     cataracts, bilateral  . GIVENS CAPSULE STUDY N/A 09/29/2019   Procedure: GIVENS CAPSULE STUDY;  Surgeon: Daneil Dolin, MD;  [facility];  Service: Endoscopy;  Laterality: N/A;  7:30am  . INCONTINENCE SURGERY  08/26/09   [place]  . JOINT REPLACEMENT     right total knee  . MALONEY DILATION N/A 09/16/2014   Procedure: Venia Minks DILATION;  Surgeon: Daneil Dolin, MD;  [facility];  Service: Endoscopy;  Laterality: N/A;  . Venia Minks DILATION N/A 12/08/2018   Procedure: Venia Minks DILATION;  Surgeon: Daneil Dolin, MD;  [facility];  Service: Endoscopy;  Laterality: N/A;  . PARS PLANA VITRECTOMY W/ REPAIR OF MACULAR HOLE Left 01/19/2014  . POLYPECTOMY  02/26/2019   Procedure: POLYPECTOMY;  Surgeon: Danie Binder, MD;  [facility];  Service: Endoscopy;;  ascending colon polyps x2  . SAVORY DILATION N/A 09/16/2014   Procedure: SAVORY DILATION;  Surgeon: Daneil Dolin, MD;  [facility];  Service: Endoscopy;  Laterality: N/A;  . TONSILLECTOMY    . TOTAL KNEE ARTHROPLASTY Right 05/13/07   Dr. Aline Brochure  . TRIGGER FINGER RELEASE Right 06/26/2018   Procedure: RIGHT INDEX FINGER TRIGGER FINGER/A-1 PULLEY RELEASE;  Surgeon: Carole Civil, MD;  Location: [facility];  Service: Orthopedics;  Laterality: Right;    Current Outpatient Medications  Medication Sig Dispense Refill  . HYDROcodone-acetaminophen (NORCO) 10-325 MG tablet Take 1 tablet by mouth 2 (two) times daily as needed for up to 5 days for severe pain. 10 tablet 0   No current facility-administered medications for this visit.    Allergies as of 07/12/2020  . (No Known Allergies)    Family History  Problem Relation Age of Onset  . Cancer Mother   . Heart disease Mother   . Cancer Father   . Heart disease Father   . Diabetes Sister   . Hypertension Brother   . Heart disease Brother   . Cancer Sister   . Kidney failure  Sister   . Diabetes Son   . Hypertension Son   . Hypertension Daughter   . Colon cancer Neg Hx     Social History   Socioeconomic History  . Marital status: Single    Spouse name: Not on file  . Number of children: 2  . Years of education: Not on file  . Highest education level: Not on file  Occupational History  . Occupation: disabled    Employer: RETIRED  Tobacco Use  . Smoking status: Former Smoker    Packs/day: 0.50    Years: 25.00    Pack years: 12.50    Types: Cigarettes    Quit date: 01/01/2004    Years since quitting: 16.5  . Smokeless tobacco: Never Used  Vaping Use  . Vaping Use: Never used  Substance and Sexual Activity  . Alcohol use: No  . Drug use: No  . Sexual activity: Never    Birth control/protection: Surgical  Other Topics Concern  . Not on file  Social History Narrative   Patient lives at home by herself   Patient drinks one cup of coffee a day   Patient is right handed.   Social Determinants of Health    Financial Resource Strain:   . Difficulty of Paying Living Expenses:   Food Insecurity:   . Worried About Charity fundraiser in the Last Year:   . Arboriculturist in the Last Year:   Transportation Needs:   . Film/video editor (Medical):   Marland Kitchen Lack of Transportation (Non-Medical):   Physical Activity:   . Days of Exercise per Week:   . Minutes of Exercise per Session:   Stress:   . Feeling of Stress :   Social Connections:   . Frequency of Communication with Friends and Family:   . Frequency of Social Gatherings with Friends and Family:   . Attends Religious Services:   . Active Member of Clubs or Organizations:   . Attends Archivist Meetings:   Marland Kitchen Marital Status:     Review of Systems: Gen: Denies fever, chills, anorexia. Denies fatigue, weakness, weight loss.  CV: Denies chest pain, palpitations, syncope, peripheral edema, and claudication. Resp: Denies dyspnea at rest, cough, wheezing, coughing up blood, and pleurisy. GI: see HPI Derm: Denies rash, itching, dry skin Psych: Denies depression, anxiety, memory loss, confusion. No homicidal or suicidal ideation.  Heme: Denies bruising, bleeding, and enlarged lymph nodes.  Physical Exam: BP 138/69   Pulse 77   Temp 98.8 F (37.1 C) (Oral)   Ht 5\' 6"  (1.676 m)   BMI 40.14 kg/m  General:   Alert and oriented. No distress noted. Very flat affect Head:  Normocephalic and atraumatic. Eyes:  Conjuctiva clear without scleral icterus. Mouth:  Mask in place Abdomen:  +BS, soft, non-tender and non-distended. No rebound or guarding. Sitting in wheelchair Msk:  Sitting in wheelchair  Extremities:  With bilateral lower extremity edema Neurologic:  Alert and  oriented x4 Psych:  Alert and cooperative.   ASSESSMENT: [name] is a 79 y.o. female presenting today with history of acute on chronic anemia, low-volume hematochezia in setting of C. Difficile colitis, initially diagnosed June 16th (positive antigen,  negative toxin, reflex PCR positive). Completed course of vancomycin with marginal improvement but then recurrence of symptoms after completion of antibiotics. Dificid starting while inpatient and was to continue for a total of 10 days.   She does seem to have noted some clinical improvement since  discharge, with last dose of Dificid yesterday. However, now she is stating recurrent diarrhea. Difficult historian. Initially, I was going to extend Dificid for a few days, but after review of chart once patient left, she has completed appropriate therapy course. High concern for possible recurrence/refractory and also not able to exclude a post-infectious presentation. Persistent rectal bleeding noted, but Hgb is stable.   I contacted Ok Edwards, NP, who works at the [employer]. We discussed patient's clinical course and I've asked for Cdiff toxin and antigen to be performed today. Last on file was at time of initial diagnosis. Following most updated guidelines as of June 2021, if POSITIVE, she will need Dificid 200 mg BID for 5 days followed by Dificid once every other day for total of 20 days. 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 She declines any evaluation for fecal transplant if needed, stating she does not want to do this whatsoever.   Ultimately, she needs an early interval colonoscopy, as CT June 2021 with diffuse circumferential wall thickening of descending colon and sigmoid , though this was in the setting of Cdiff infection. We will ensure she has recovered from [place] first.   Mildly elevated LFTs in setting of acute illness: will recheck on Monday.    PLAN:   Recheck Cdiff toxin and antigen now  Lactose-free diet  Discussed with Ok Edwards, NP  Will ultimately need colonoscopy in future once recovered  Recheck HFP, CBC next week  Further recommendations to follow   Annitta Needs, PhD, ANP-BC [facility]

## 2020-07-12 NOTE — Assessment & Plan Note (Signed)
Patient describes ongoing blood intermixed with stool.  Hemoglobin 8.9 on 7/12.  Follow-up outpatient GI appointment today.

## 2020-07-12 NOTE — Patient Instructions (Signed)
See assessment and plan under each diagnosis in the problem list and acutely for this visit 

## 2020-07-12 NOTE — Patient Instructions (Addendum)
(  This AVS was completed on 7/13, plan has changed subsequently. )  We are extending Dificid for 4 more days (starting today, last dose the evening of the 16th).  Please follow a lactose-free diet.  Please have blood work completed on Monday.  Please call with how you are doing if worsening in the next 24-48 hours.   If over the next 1-2 weeks you note improvement in stool habits with a marked return to diarrhea, then we will check your stool again at that time.   Ultimately, we will pursue a colonoscopy with Dr. Gala Romney in the next 6 weeks.  I enjoyed seeing you again today! As you know, I value our relationship and want to provide genuine, compassionate, and quality care. I welcome your feedback. If you receive a survey regarding your visit,  I greatly appreciate you taking time to fill this out. See you next time!  Annitta Needs, PhD, ANP-BC [facility]

## 2020-07-13 ENCOUNTER — Other Ambulatory Visit (HOSPITAL_COMMUNITY)
Admission: AD | Admit: 2020-07-13 | Discharge: 2020-07-13 | Disposition: A | Discharge status: Home / Self Care | Payer: Medicare Other | Source: Skilled Nursing Facility | Attending: Adult Health | Admitting: Adult Health

## 2020-07-13 ENCOUNTER — Encounter: Payer: Self-pay | Admitting: Gastroenterology

## 2020-07-13 ENCOUNTER — Encounter: Payer: Self-pay | Admitting: Adult Health

## 2020-07-13 ENCOUNTER — Non-Acute Institutional Stay (SKILLED_NURSING_FACILITY): Payer: Medicare Other | Admitting: Adult Health

## 2020-07-13 DIAGNOSIS — K922 Gastrointestinal hemorrhage, unspecified: Secondary | ICD-10-CM | POA: Diagnosis not present

## 2020-07-13 DIAGNOSIS — M170 Bilateral primary osteoarthritis of knee: Secondary | ICD-10-CM | POA: Diagnosis not present

## 2020-07-13 LAB — C DIFFICILE QUICK SCREEN W PCR REFLEX
C Diff antigen: NEGATIVE
C Diff interpretation: NOT DETECTED
C Diff toxin: NEGATIVE

## 2020-07-13 NOTE — Progress Notes (Signed)
I contacted patient's daughter and updated her regarding change in plan from yesterday. Stated understanding.

## 2020-07-13 NOTE — Progress Notes (Signed)
Location:    [facility] Room Number: 153/P Place of Service:  SNF (31)   CODE STATUS: Full Code  No Known Allergies  Chief Complaint  Patient presents with   Acute Visit    Pain    HPI:  She has been on long term vicodin 10/325 mg twice daily as needed for pain in her bilateral lower extremities. She tells that no other doses or medications helps to manage her pain. She has tried other medications such as gels and tylenol without relief.   Past Medical History:  Diagnosis Date   Anemia in chronic renal disease 12/30/2015   Anxiety    Arthritis    "all over" (01/19/2014)   Arthritis, lumbar spine    Carpal tunnel syndrome    Chronic bronchitis ([place])    "got it q yr for awhile; hasn't had it in awhile" (01/19/2014)   Chronic kidney disease (CKD) stage G3b/A1, moderately decreased glomerular filtration rate (GFR) between 30-44 mL/min/1.73 square meter and albuminuria creatinine ratio less than 30 mg/g 09/14/2009   Qualifier: Diagnosis of  By: Moshe Cipro MD, Margaret     Complication of anesthesia    combative   COPD (chronic obstructive pulmonary disease) ([place])    Depression    Diverticulosis 07/2004   Colonscopy Dr Gala Romney   Esophagitis, erosive 2009   Exertional shortness of breath    Gastroesophageal reflux    Headache(784.0)    "usually a couple times/wk" (01/19/2014)   Heart murmur    saw cardiology In [place], he told her she did not need to come back.   Hyperlipidemia    Hypertension    Impingement syndrome, shoulder    Iron deficiency anemia    Mitral regurgitation    Myasthenia gravis    Myasthenia gravis in remission ([place]) 11/24/2014   Obesity    OSA on CPAP    Negative on last sleep study   Pneumonia 01/2012   Pulmonary HTN ([place])    Schatzki's ring    Last EGD w/ dilation 02/08/11, 2009 & 2007   Seasonal allergies    Shoulder pain    Thyroid disease    "used to take RX; they took me off it" (01/19/2014)     Tobacco abuse    Type II diabetes mellitus ([place])     Past Surgical History:  Procedure Laterality Date   25 GAUGE PARS PLANA VITRECTOMY WITH 20 GAUGE MVR PORT Left 01/19/2014   Procedure: 25 GAUGE PARS PLANA VITRECTOMY WITH 20 GAUGE MVR PORT; MEMBRAME PEEL; SERUM PATCH; LASER TREATMENT; C3F8;  Surgeon: Hayden Pedro, MD;  Location: [facility];  Service: Ophthalmology;  Laterality: Left;   ABDOMINAL HYSTERECTOMY     AGILE CAPSULE N/A 08/05/2019   Procedure: AGILE CAPSULE;  Surgeon: Daneil Dolin, MD;  [facility];  Service: Endoscopy;  Laterality: N/A;  7:30am   APPENDECTOMY     CARPAL TUNNEL RELEASE Bilateral    CATARACT EXTRACTION W/PHACO Left 10/21/2013   Procedure: LEFT CATARACT EXTRACTION PHACO AND INTRAOCULAR LENS PLACEMENT (IOC);  Surgeon: Marylynn Pearson, MD;  Location: [facility];  Service: Ophthalmology;  Laterality: Left;   CHOLECYSTECTOMY     COLONOSCOPY  05/08/2012   XNT:ZGYFVCBS and external hemorrhoids; colonic diverticulosis   COLONOSCOPY WITH PROPOFOL N/A 02/26/2019   two simple adenomas, diverticulosis,  and internal hemorrhoids.    ESOPHAGEAL DILATION     "more than 3 times" (01/19/2014)   ESOPHAGOGASTRODUODENOSCOPY  02/08/11   [place]-Distal esophageal erosion consistent with mild  erosive reflux   esophagitis/ Noncritical Schatzki ring, small hiatal hernia otherwise upper/ gastrointestinal tract appeared unremarkable, status post passage  of a Maloney dilation to biopsy disruption of the ring described   ESOPHAGOGASTRODUODENOSCOPY  04/2012   2 tandem incomplete distal esophagea rings s/p dilation.    ESOPHAGOGASTRODUODENOSCOPY N/A 09/16/2014   Dr. Gala Romney: Schatzki ring status post dilation/disruption.  Hiatal hernia.   ESOPHAGOGASTRODUODENOSCOPY (EGD) WITH PROPOFOL N/A 12/08/2018   EGD with mild Schatzki ring s/p dilation, small hiatal hernia, otherwise normal   EYE SURGERY     cataracts, bilateral   GIVENS CAPSULE STUDY N/A 09/29/2019   Procedure: GIVENS  CAPSULE STUDY;  Surgeon: Daneil Dolin, MD;  [facility];  Service: Endoscopy;  Laterality: N/A;  7:30am   INCONTINENCE SURGERY  08/26/09   [facility]   JOINT REPLACEMENT     right total knee   MALONEY DILATION N/A 09/16/2014   Procedure: Venia Minks DILATION;  Surgeon: Daneil Dolin, MD;  [facility];  Service: Endoscopy;  Laterality: N/A;   MALONEY DILATION N/A 12/08/2018   Procedure: Venia Minks DILATION;  Surgeon: Daneil Dolin, MD;  [facility];  Service: Endoscopy;  Laterality: N/A;   PARS PLANA VITRECTOMY W/ REPAIR OF MACULAR HOLE Left 01/19/2014   POLYPECTOMY  02/26/2019   Procedure: POLYPECTOMY;  Surgeon: Danie Binder, MD;  [facility];  Service: Endoscopy;;  ascending colon polyps x2   SAVORY DILATION N/A 09/16/2014   Procedure: SAVORY DILATION;  Surgeon: Daneil Dolin, MD;  [facility];  Service: Endoscopy;  Laterality: N/A;   TONSILLECTOMY     TOTAL KNEE ARTHROPLASTY Right 05/13/07   Dr. Aline Brochure   TRIGGER FINGER RELEASE Right 06/26/2018   Procedure: RIGHT INDEX FINGER TRIGGER FINGER/A-1 PULLEY RELEASE;  Surgeon: Carole Civil, MD;  Location: [facility];  Service: Orthopedics;  Laterality: Right;    Social History   Socioeconomic History   Marital status: Single    Spouse name: Not on file   Number of children: 2   Years of education: Not on file   Highest education level: Not on file  Occupational History   Occupation: disabled    Employer: RETIRED  Tobacco Use   Smoking status: Former Smoker    Packs/day: 0.50    Years: 25.00    Pack years: 12.50    Types: Cigarettes    Quit date: 01/01/2004    Years since quitting: 16.5   Smokeless tobacco: Never Used  Scientific laboratory technician Use: Never used  Substance and Sexual Activity   Alcohol use: No   Drug use: No   Sexual activity: Never    Birth control/protection: Surgical  Other Topics Concern   Not on file  Social History Narrative    Patient lives at home by herself   Patient drinks one cup of coffee a day   Patient is right handed.   Social Determinants of Health   Financial Resource Strain:    Difficulty of Paying Living Expenses:   Food Insecurity:    Worried About Charity fundraiser in the Last Year:    Arboriculturist in the Last Year:   Transportation Needs:    Film/video editor (Medical):    Lack of Transportation (Non-Medical):   Physical Activity:    Days of Exercise per Week:    Minutes of Exercise per Session:   Stress:    Feeling of Stress :   Social Connections:  Frequency of Communication with Friends and Family:    Frequency of Social Gatherings with Friends and Family:    Attends Religious Services:    Active Member of Clubs or Organizations:    Attends Music therapist:    Marital Status:   Intimate Partner Violence:    Fear of Current or Ex-Partner:    Emotionally Abused:    Physically Abused:    Sexually Abused:    Family History  Problem Relation Age of Onset   Cancer Mother    Heart disease Mother    Cancer Father    Heart disease Father    Diabetes Sister    Hypertension Brother    Heart disease Brother    Cancer Sister    Kidney failure Sister    Diabetes Son    Hypertension Son    Hypertension Daughter    Colon cancer Neg Hx       VITAL SIGNS BP 133/64    Pulse 61    Temp (!) 97 F (36.1 C) (Oral)    Resp 20    Ht '5\' 6"'  (1.676 m)    Wt 248 lb 11.2 oz (112.8 kg)    SpO2 99%    BMI 40.14 kg/m   Outpatient Encounter Medications as of 07/13/2020  Medication Sig   acetaminophen (TYLENOL) 325 MG tablet Take 2 tablets (650 mg total) by mouth every 6 (six) hours as needed for mild pain, fever or headache (or Fever >/= 101).   Amino Acids-Protein Hydrolys (FEEDING SUPPLEMENT, PRO-STAT SUGAR FREE 64,) LIQD Take 30 mLs by mouth 3 (three) times daily with meals.   Balsam [place]-Castor Oil (VENELEX) OINT Apply topically in  the morning, at noon, and at bedtime. ointment; - ; amt: ribbon; topical   brimonidine-timolol (COMBIGAN) 0.2-0.5 % ophthalmic solution Place 1 drop into both eyes every 12 (twelve) hours.   cetirizine (ZYRTEC) 10 MG tablet TAKE 1 TABLET BY MOUTH ONCE A DAY.   Cholecalciferol (VITAMIN D3) 2000 units TABS Take 2,000 Units by mouth at bedtime.   citalopram (CELEXA) 10 MG tablet TAKE 1 TABLET BY MOUTH ONCE A DAY.   Cranberry 1000 MG CAPS Take 1,000 mg by mouth daily.   cycloSPORINE (RESTASIS) 0.05 % ophthalmic emulsion Place 1 drop into both eyes 2 (two) times daily.    diltiazem (CARDIZEM CD) 120 MG 24 hr capsule Take 1 capsule (120 mg total) by mouth daily.   ezetimibe (ZETIA) 10 MG tablet TAKE 1 TABLET BY MOUTH ONCE A DAY.   famotidine (PEPCID) 40 MG tablet Take 40 mg by mouth every evening.   fidaxomicin (DIFICID) 200 MG TABS tablet Take 200 mg by mouth 2 (two) times daily.   fluticasone (FLONASE) 50 MCG/ACT nasal spray PLACE 2 SPRAYS INTO BOTH NOSTRILS DAILY.   gabapentin (NEURONTIN) 300 MG capsule TAKE (1) CAPSULE BY MOUTH TWICE DAILY.   HYDROcodone-acetaminophen (NORCO) 10-325 MG tablet Take 1 tablet by mouth 2 (two) times daily as needed for up to 5 days for severe pain.   hydrocortisone (ANUSOL-HC) 2.5 % rectal cream Place 1 application rectally 2 (two) times daily.   insulin glargine (LANTUS SOLOSTAR) 100 UNIT/ML Solostar Pen Inject 6 Units into the skin at bedtime.    insulin lispro (HUMALOG KWIKPEN) 100 UNIT/ML KwikPen insulin pen; 100 unit/mL; amt: Per Sliding Scale; If Blood Sugar is 0 to 149, give 0 Units. If Blood Sugar is greater than 149, give 3 Units. subcutaneous Before Meals   linagliptin (TRADJENTA) 5 MG TABS tablet  Take 1 tablet (5 mg total) by mouth daily.   losartan (COZAAR) 25 MG tablet Take 1 tablet (25 mg total) by mouth daily.   Menthol, Topical Analgesic, (BIOFREEZE) 4 % GEL Apply topically daily. Apply a small amont; topically to her lower legs  and knees    Multiple Vitamin (MULTIVITAMIN WITH MINERALS) TABS tablet Take 1 tablet by mouth at bedtime.   NON FORMULARY Diet: _____ Regular, ______ NAS, _______Consistent Carbohydrate, _______NPO ___X__Other :SOFT DIET   nystatin (NYSTATIN) powder Apply 1 application topically daily as needed. Special Instructions: fungal rash to groin and inguinal folds   ondansetron (ZOFRAN) 4 MG tablet Take 1 tablet (4 mg total) by mouth every 8 (eight) hours as needed for nausea or vomiting.   predniSONE (DELTASONE) 5 MG tablet Take 1 tablet (5 mg total) by mouth daily.   PROAIR HFA 108 (90 Base) MCG/ACT inhaler INHALE 2 PUFFS BY MOUTH EVERY 6 HOURS AS NEEDED .   Probiotic Product (RISA-BID PROBIOTIC) TABS Take 1 tablet by mouth 2 (two) times daily.   pyridostigmine (MESTINON) 60 MG tablet TAKE (1) TABLET BY MOUTH THREE TIMES A DAY.   rosuvastatin (CRESTOR) 40 MG tablet TAKE (1) TABLET BY MOUTH AT BEDTIME.   solifenacin (VESICARE) 10 MG tablet TAKE 1 TABLET BY MOUTH ONCE A DAY.   venlafaxine XR (EFFEXOR-XR) 150 MG 24 hr capsule TAKE 1 CAPSULE BY MOUTH DAILY.   [DISCONTINUED] hydrocortisone (ANUSOL-HC) 25 MG suppository Place 25 mg rectally 2 (two) times daily.   No facility-administered encounter medications on file as of 07/13/2020.     SIGNIFICANT DIAGNOSTIC EXAMS   PREVIOUS   06-12-20: ct of abdomen and pelvis 1. Diffuse circumferential wall thickening of much of the descending colon and sigmoid colon with multiple enlarged adjacent regional lymph nodes. Findings are concerning for an infectious or inflammatory colitis. a follow-up colonoscopy is recommended as an outpatient. 2. Complex cystic mass in the right hemipelvis likely associated with the right ovary measuring up to 3.9 cm. Follow-up with a nonemergent outpatient pelvic ultrasound is recommended. 3. Large hiatal hernia. 4. Nonobstructive right nephrolithiasis. Aortic Atherosclerosis  NO NEW EXAMS.   LABS REVIEWED  PREVIOUS  06-12-20: wbc 17.7; hgb 9.1; hct 29.0; mcv 84.8 plt 560; glucose 250; bun 35; creat 1.68; k+ 3.2; na++ 135; ca 8.2 liver normal albumin 2.3 mag 1.5 phos 3.0 hgb a1c 6.9 06-14-20: wbc 13.9; hgb 8.0; hct 25.1; mcv 84.2 plt 415; glucose 99; bun 22; creat 1.21; k+ 3.2; na++ 135; ca 7.3 06-15-20: + c-diff 06-17-20: wbc 14.8; hgb 8.4; hct 25.7; mcv 83.2 plt 355; glucose 82; bun 16; creat 1.22; k+ 4.5; na++ 135; ca 7.8 phos 2.4 albumin 1.8 mag 1.6 06-20-20: wbc 14.5; hgb 7.5; hct 22.9; mcv 81.2 plt 410; glucose 80; bun 16; creat 1.18; k+ 3.9; na++ 134; ca 7.7 liver normal albumin 1.5 mag 1.8  07-03-20: wbc 109; hgb 6.3; hct 20.5; mcv 90.7 plt 491; glucose 105; bun 24; creat 0.78; k+ 3.2; na++ 137; ca 7.8 ast 54; alt 70; alk phos 138; albumin 1.7 07-04-20: hgb 8.9; hct 29.4 07-05-20: wbc 10.7; hgb 8.7; hct 28.7; mcv 92.6 plt 400; glucose 59; bun 17; creat 0.61; k+ 3.9; na++ 140; ca 7.8 mag 1.7  07-06-20: wbc 9.2; hgb 9.2; hct 31.1; mcv 93.7 plt 399  07-11-20: wbc 10.6; hgb 8.9; hct 28.9; mcv 90.9 plt 371  NO NEW LABS.   Review of Systems  Constitutional: Negative for malaise/fatigue.  Respiratory: Negative for cough and shortness of breath.  Cardiovascular: Negative for chest pain, palpitations and leg swelling.  Gastrointestinal: Positive for diarrhea. Negative for abdominal pain and heartburn.  Musculoskeletal: Negative for back pain, joint pain and myalgias.  Skin: Negative.   Neurological: Negative for dizziness.  Psychiatric/Behavioral: The patient is not nervous/anxious.     Physical Exam Constitutional:      General: She is not in acute distress.    Appearance: She is morbidly obese. She is not diaphoretic.  Neck:     Thyroid: No thyromegaly.  Cardiovascular:     Rate and Rhythm: Normal rate and regular rhythm.     Pulses: Normal pulses.     Heart sounds: Normal heart sounds.  Pulmonary:     Effort: Pulmonary effort is normal. No respiratory distress.     Breath sounds: Normal  breath sounds.  Abdominal:     General: Bowel sounds are normal. There is no distension.     Palpations: Abdomen is soft.     Tenderness: There is no abdominal tenderness.  Musculoskeletal:        General: Normal range of motion.     Cervical back: Neck supple.     Right lower leg: No edema.     Left lower leg: No edema.  Lymphadenopathy:     Cervical: No cervical adenopathy.  Skin:    General: Skin is warm and dry.  Neurological:     Mental Status: She is alert and oriented to person, place, and time.  Psychiatric:        Mood and Affect: Mood normal.       ASSESSMENT/ PLAN:  TODAY  1. Primary osteoarthritis of bilateral knees Will continue vicodin 10/325 mg twice daily as needed for her pain management.  Will monitor her status.    MD is aware of resident's narcotic use and is in agreement with current plan of care. We will attempt to wean resident as appropriate.  Ok Edwards NP [facility]  Contact [phone] Monday through Friday 8am- 5pm  After hours call [phone]

## 2020-07-14 ENCOUNTER — Encounter: Payer: Self-pay | Admitting: Adult Health

## 2020-07-14 ENCOUNTER — Non-Acute Institutional Stay (SKILLED_NURSING_FACILITY): Payer: Medicare Other | Admitting: Adult Health

## 2020-07-14 DIAGNOSIS — R197 Diarrhea, unspecified: Secondary | ICD-10-CM

## 2020-07-14 NOTE — Progress Notes (Signed)
Location:    [facility] Room Number: 153/P Place of Service:  SNF (31)   CODE STATUS: Full Code  No Known Allergies  Chief Complaint  Patient presents with  . Follow-up    Lab Follow Up    HPI:  She continues to have diarrheal stools. Her c-dff antigen and toxin are both negative. I have spoken with Devin Going from GI regarding her continued stools. Will stop dificid; will need to bulk up her stools. She does have some left lower quad pain at time. There are no reports of fevers present.   Past Medical History:  Diagnosis Date  . Anemia in chronic renal disease 12/30/2015  . Anxiety   . Arthritis    "all over" (01/19/2014)  . Arthritis, lumbar spine   . Carpal tunnel syndrome   . Chronic bronchitis ([place])    "got it q yr for awhile; hasn't had it in awhile" (01/19/2014)  . Chronic kidney disease (CKD) stage G3b/A1, moderately decreased glomerular filtration rate (GFR) between 30-44 mL/min/1.73 square meter and albuminuria creatinine ratio less than 30 mg/g 09/14/2009   Qualifier: Diagnosis of  By: Moshe Cipro MD, Joycelyn Schmid    . Complication of anesthesia    combative  . COPD (chronic obstructive pulmonary disease) ([place])   . Depression   . Diverticulosis 07/2004   Colonscopy Dr Gala Romney  . Esophagitis, erosive 2009  . Exertional shortness of breath   . Gastroesophageal reflux   . YIRSWNIO(270.3)    "usually a couple times/wk" (01/19/2014)  . Heart murmur    saw cardiology In [place], he told her she did not need to come back.  . Hyperlipidemia   . Hypertension   . Impingement syndrome, shoulder   . Iron deficiency anemia   . Mitral regurgitation   . Myasthenia gravis   . Myasthenia gravis in remission ([place]) 11/24/2014  . Obesity   . OSA on CPAP    Negative on last sleep study  . Pneumonia 01/2012  . Pulmonary HTN ([place])   . Schatzki's ring    Last EGD w/ dilation 02/08/11, 2009 & 2007  . Seasonal allergies   . Shoulder pain   . Thyroid disease     "used to take RX; they took me off it" (01/19/2014)  . Tobacco abuse   . Type II diabetes mellitus ([place])     Past Surgical History:  Procedure Laterality Date  . [place] VITRECTOMY WITH 20 GAUGE MVR PORT Left 01/19/2014   Procedure: 25 GAUGE PARS PLANA VITRECTOMY WITH 20 GAUGE MVR PORT; MEMBRAME PEEL; SERUM PATCH; LASER TREATMENT; C3F8;  Surgeon: Hayden Pedro, MD;  Location: [facility];  Service: Ophthalmology;  Laterality: Left;  . ABDOMINAL HYSTERECTOMY    . AGILE CAPSULE N/A 08/05/2019   Procedure: AGILE CAPSULE;  Surgeon: Daneil Dolin, MD;  [facility];  Service: Endoscopy;  Laterality: N/A;  7:30am  . APPENDECTOMY    . CARPAL TUNNEL RELEASE Bilateral   . CATARACT EXTRACTION W/PHACO Left 10/21/2013   Procedure: LEFT CATARACT EXTRACTION PHACO AND INTRAOCULAR LENS PLACEMENT (IOC);  Surgeon: Marylynn Pearson, MD;  Location: [facility];  Service: Ophthalmology;  Laterality: Left;  . CHOLECYSTECTOMY    . COLONOSCOPY  05/08/2012   JKK:XFGHWEXH and external hemorrhoids; colonic diverticulosis  . COLONOSCOPY WITH PROPOFOL N/A 02/26/2019   two simple adenomas, diverticulosis,  and internal hemorrhoids.   . ESOPHAGEAL DILATION     "more than 3 times" (01/19/2014)  . ESOPHAGOGASTRODUODENOSCOPY  02/08/11  [place]-Distal esophageal erosion consistent with mild erosive reflux   esophagitis/ Noncritical Schatzki ring, small hiatal hernia otherwise upper/ gastrointestinal tract appeared unremarkable, status post passage  of a Maloney dilation to biopsy disruption of the ring described  . ESOPHAGOGASTRODUODENOSCOPY  04/2012   2 tandem incomplete distal esophagea rings s/p dilation.   . ESOPHAGOGASTRODUODENOSCOPY N/A 09/16/2014   Dr. Gala Romney: Schatzki ring status post dilation/disruption.  Hiatal hernia.  Marland Kitchen ESOPHAGOGASTRODUODENOSCOPY (EGD) WITH PROPOFOL N/A 12/08/2018   EGD with mild Schatzki ring s/p dilation, small hiatal hernia, otherwise normal  . EYE SURGERY     cataracts, bilateral  . GIVENS  CAPSULE STUDY N/A 09/29/2019   Procedure: GIVENS CAPSULE STUDY;  Surgeon: Daneil Dolin, MD;  [facility];  Service: Endoscopy;  Laterality: N/A;  7:30am  . INCONTINENCE SURGERY  08/26/09   [place]  . JOINT REPLACEMENT     right total knee  . MALONEY DILATION N/A 09/16/2014   Procedure: Venia Minks DILATION;  Surgeon: Daneil Dolin, MD;  [facility];  Service: Endoscopy;  Laterality: N/A;  . Venia Minks DILATION N/A 12/08/2018   Procedure: Venia Minks DILATION;  Surgeon: Daneil Dolin, MD;  [facility];  Service: Endoscopy;  Laterality: N/A;  . PARS PLANA VITRECTOMY W/ REPAIR OF MACULAR HOLE Left 01/19/2014  . POLYPECTOMY  02/26/2019   Procedure: POLYPECTOMY;  Surgeon: Danie Binder, MD;  [facility];  Service: Endoscopy;;  ascending colon polyps x2  . SAVORY DILATION N/A 09/16/2014   Procedure: SAVORY DILATION;  Surgeon: Daneil Dolin, MD;  [facility];  Service: Endoscopy;  Laterality: N/A;  . TONSILLECTOMY    . TOTAL KNEE ARTHROPLASTY Right 05/13/07   Dr. Aline Brochure  . TRIGGER FINGER RELEASE Right 06/26/2018   Procedure: RIGHT INDEX FINGER TRIGGER FINGER/A-1 PULLEY RELEASE;  Surgeon: Carole Civil, MD;  Location: [facility];  Service: Orthopedics;  Laterality: Right;    Social History   Socioeconomic History  . Marital status: Single    Spouse name: Not on file  . Number of children: 2  . Years of education: Not on file  . Highest education level: Not on file  Occupational History  . Occupation: disabled    Employer: RETIRED  Tobacco Use  . Smoking status: Former Smoker    Packs/day: 0.50    Years: 25.00    Pack years: 12.50    Types: Cigarettes    Quit date: 01/01/2004    Years since quitting: 16.5  . Smokeless tobacco: Never Used  Vaping Use  . Vaping Use: Never used  Substance and Sexual Activity  . Alcohol use: No  . Drug use: No  . Sexual activity: Never    Birth control/protection: Surgical  Other Topics  Concern  . Not on file  Social History Narrative   Patient lives at home by herself   Patient drinks one cup of coffee a day   Patient is right handed.   Social Determinants of Health   Financial Resource Strain:   . Difficulty of Paying Living Expenses:   Food Insecurity:   . Worried About Charity fundraiser in the Last Year:   . Arboriculturist in the Last Year:   Transportation Needs:   . Film/video editor (Medical):   Marland Kitchen Lack of Transportation (Non-Medical):   Physical Activity:   . Days of Exercise per Week:   . Minutes of Exercise per Session:   Stress:   . Feeling of  Stress :   Social Connections:   . Frequency of Communication with Friends and Family:   . Frequency of Social Gatherings with Friends and Family:   . Attends Religious Services:   . Active Member of Clubs or Organizations:   . Attends Archivist Meetings:   Marland Kitchen Marital Status:   Intimate Partner Violence:   . Fear of Current or Ex-Partner:   . Emotionally Abused:   Marland Kitchen Physically Abused:   . Sexually Abused:    Family History  Problem Relation Age of Onset  . Cancer Mother   . Heart disease Mother   . Cancer Father   . Heart disease Father   . Diabetes Sister   . Hypertension Brother   . Heart disease Brother   . Cancer Sister   . Kidney failure Sister   . Diabetes Son   . Hypertension Son   . Hypertension Daughter   . Colon cancer Neg Hx       VITAL SIGNS BP 133/64   Pulse 61   Temp 98.6 F (37 C) (Oral)   Resp 20   Ht '5\' 6"'$  (1.676 m)   Wt 248 lb 11.2 oz (112.8 kg)   SpO2 99%   BMI 40.14 kg/m   Outpatient Encounter Medications as of 07/14/2020  Medication Sig  . acetaminophen (TYLENOL) 325 MG tablet Take 2 tablets (650 mg total) by mouth every 6 (six) hours as needed for mild pain, fever or headache (or Fever >/= 101).  . Amino Acids-Protein Hydrolys (FEEDING SUPPLEMENT, PRO-STAT SUGAR FREE 64,) LIQD Take 30 mLs by mouth 3 (three) times daily with meals.  Roseanne Kaufman  [place]-Castor Oil (VENELEX) OINT Apply topically in the morning, at noon, and at bedtime. ointment; - ; amt: ribbon; topical  . brimonidine-timolol (COMBIGAN) 0.2-0.5 % ophthalmic solution Place 1 drop into both eyes every 12 (twelve) hours.  . cetirizine (ZYRTEC) 10 MG tablet TAKE 1 TABLET BY MOUTH ONCE A DAY.  Marland Kitchen Cholecalciferol (VITAMIN D3) 2000 units TABS Take 2,000 Units by mouth at bedtime.  . citalopram (CELEXA) 10 MG tablet TAKE 1 TABLET BY MOUTH ONCE A DAY.  Marland Kitchen Cranberry 1000 MG CAPS Take 1,000 mg by mouth daily.  . cycloSPORINE (RESTASIS) 0.05 % ophthalmic emulsion Place 1 drop into both eyes 2 (two) times daily.   Marland Kitchen diltiazem (CARDIZEM CD) 120 MG 24 hr capsule Take 1 capsule (120 mg total) by mouth daily.  Marland Kitchen ezetimibe (ZETIA) 10 MG tablet TAKE 1 TABLET BY MOUTH ONCE A DAY.  . famotidine (PEPCID) 40 MG tablet Take 40 mg by mouth every evening.  . fluticasone (FLONASE) 50 MCG/ACT nasal spray PLACE 2 SPRAYS INTO BOTH NOSTRILS DAILY.  Marland Kitchen gabapentin (NEURONTIN) 300 MG capsule TAKE (1) CAPSULE BY MOUTH TWICE DAILY.  . hydrocortisone (ANUSOL-HC) 2.5 % rectal cream Place 1 application rectally 2 (two) times daily.  . insulin glargine (LANTUS SOLOSTAR) 100 UNIT/ML Solostar Pen Inject 6 Units into the skin at bedtime.   . insulin lispro (HUMALOG KWIKPEN) 100 UNIT/ML KwikPen insulin pen; 100 unit/mL; amt: Per Sliding Scale; If Blood Sugar is 0 to 149, give 0 Units. If Blood Sugar is greater than 149, give 3 Units. subcutaneous Before Meals  . linagliptin (TRADJENTA) 5 MG TABS tablet Take 1 tablet (5 mg total) by mouth daily.  Marland Kitchen losartan (COZAAR) 25 MG tablet Take 1 tablet (25 mg total) by mouth daily.  . Menthol, Topical Analgesic, (BIOFREEZE) 4 % GEL Apply topically daily. Apply a small amont; topically  to her lower legs and knees   . Multiple Vitamin (MULTIVITAMIN WITH MINERALS) TABS tablet Take 1 tablet by mouth at bedtime.  . NON FORMULARY Diet: _____ Regular, ______ NAS, _______Consistent  Carbohydrate, _______NPO ___X__Other :SOFT DIET  . nystatin (NYSTATIN) powder Apply 1 application topically daily as needed. Special Instructions: fungal rash to groin and inguinal folds  . ondansetron (ZOFRAN) 4 MG tablet Take 1 tablet (4 mg total) by mouth every 8 (eight) hours as needed for nausea or vomiting.  . predniSONE (DELTASONE) 5 MG tablet Take 1 tablet (5 mg total) by mouth daily.  Marland Kitchen PROAIR HFA 108 (90 Base) MCG/ACT inhaler INHALE 2 PUFFS BY MOUTH EVERY 6 HOURS AS NEEDED .  Marland Kitchen Probiotic Product (RISA-BID PROBIOTIC) TABS Take 1 tablet by mouth 2 (two) times daily.  Marland Kitchen pyridostigmine (MESTINON) 60 MG tablet TAKE (1) TABLET BY MOUTH THREE TIMES A DAY.  . rosuvastatin (CRESTOR) 40 MG tablet TAKE (1) TABLET BY MOUTH AT BEDTIME.  Marland Kitchen solifenacin (VESICARE) 10 MG tablet TAKE 1 TABLET BY MOUTH ONCE A DAY.  Marland Kitchen venlafaxine XR (EFFEXOR-XR) 150 MG 24 hr capsule TAKE 1 CAPSULE BY MOUTH DAILY.  . [DISCONTINUED] fidaxomicin (DIFICID) 200 MG TABS tablet Take 200 mg by mouth 2 (two) times daily.   No facility-administered encounter medications on file as of 07/14/2020.     SIGNIFICANT DIAGNOSTIC EXAMS  PREVIOUS   06-12-20: ct of abdomen and pelvis 1. Diffuse circumferential wall thickening of much of the descending colon and sigmoid colon with multiple enlarged adjacent regional lymph nodes. Findings are concerning for an infectious or inflammatory colitis. a follow-up colonoscopy is recommended as an outpatient. 2. Complex cystic mass in the right hemipelvis likely associated with the right ovary measuring up to 3.9 cm. Follow-up with a nonemergent outpatient pelvic ultrasound is recommended. 3. Large hiatal hernia. 4. Nonobstructive right nephrolithiasis. Aortic Atherosclerosis  NO NEW EXAMS.   LABS REVIEWED PREVIOUS  06-12-20: wbc 17.7; hgb 9.1; hct 29.0; mcv 84.8 plt 560; glucose 250; bun 35; creat 1.68; k+ 3.2; na++ 135; ca 8.2 liver normal albumin 2.3 mag 1.5 phos 3.0 hgb a1c  6.9 06-14-20: wbc 13.9; hgb 8.0; hct 25.1; mcv 84.2 plt 415; glucose 99; bun 22; creat 1.21; k+ 3.2; na++ 135; ca 7.3 06-15-20: + c-diff 06-17-20: wbc 14.8; hgb 8.4; hct 25.7; mcv 83.2 plt 355; glucose 82; bun 16; creat 1.22; k+ 4.5; na++ 135; ca 7.8 phos 2.4 albumin 1.8 mag 1.6 06-20-20: wbc 14.5; hgb 7.5; hct 22.9; mcv 81.2 plt 410; glucose 80; bun 16; creat 1.18; k+ 3.9; na++ 134; ca 7.7 liver normal albumin 1.5 mag 1.8  07-03-20: wbc 109; hgb 6.3; hct 20.5; mcv 90.7 plt 491; glucose 105; bun 24; creat 0.78; k+ 3.2; na++ 137; ca 7.8 ast 54; alt 70; alk phos 138; albumin 1.7 07-04-20: hgb 8.9; hct 29.4 07-05-20: wbc 10.7; hgb 8.7; hct 28.7; mcv 92.6 plt 400; glucose 59; bun 17; creat 0.61; k+ 3.9; na++ 140; ca 7.8 mag 1.7  07-06-20: wbc 9.2; hgb 9.2; hct 31.1; mcv 93.7 plt 399  07-11-20: wbc 10.6; hgb 8.9; hct 28.9; mcv 90.9 plt 371  TODAY  07-13-20: c-diff: negative   Review of Systems  Constitutional: Negative for malaise/fatigue.  Respiratory: Negative for cough and shortness of breath.   Cardiovascular: Negative for chest pain, palpitations and leg swelling.  Gastrointestinal: Positive for abdominal pain and diarrhea. Negative for heartburn.       Left lower quad   Musculoskeletal: Negative for back pain, joint  pain and myalgias.  Skin: Negative.   Neurological: Negative for dizziness.  Psychiatric/Behavioral: The patient is not nervous/anxious.      Physical Exam Constitutional:      General: She is not in acute distress.    Appearance: She is well-developed. She is morbidly obese. She is not diaphoretic.  Neck:     Thyroid: No thyromegaly.  Cardiovascular:     Rate and Rhythm: Normal rate and regular rhythm.     Pulses: Normal pulses.     Heart sounds: Normal heart sounds.  Pulmonary:     Effort: Pulmonary effort is normal. No respiratory distress.     Breath sounds: Normal breath sounds.  Abdominal:     General: Bowel sounds are normal. There is no distension.     Palpations:  Abdomen is soft.     Tenderness: There is no abdominal tenderness.  Musculoskeletal:        General: Normal range of motion.     Cervical back: Neck supple.     Right lower leg: No edema.     Left lower leg: No edema.  Lymphadenopathy:     Cervical: No cervical adenopathy.  Skin:    General: Skin is warm and dry.  Neurological:     Mental Status: She is alert and oriented to person, place, and time.  Psychiatric:        Mood and Affect: Mood normal.       ASSESSMENT/ PLAN:  TODAY  1. Diarrhea in a patient with recent c-diff infection; will begin questran 4 gm daily for bulking of her stools; will monitor her status.     MD is aware of resident's narcotic use and is in agreement with current plan of care. We will attempt to wean resident as appropriate.  Ok Edwards NP [facility]  Contact [phone] Monday through Friday 8am- 5pm  After hours call [phone]

## 2020-07-15 ENCOUNTER — Other Ambulatory Visit: Payer: Self-pay | Admitting: Adult Health

## 2020-07-15 ENCOUNTER — Non-Acute Institutional Stay (SKILLED_NURSING_FACILITY): Payer: Medicare Other | Admitting: Adult Health

## 2020-07-15 ENCOUNTER — Encounter: Payer: Self-pay | Admitting: Adult Health

## 2020-07-15 DIAGNOSIS — E1122 Type 2 diabetes mellitus with diabetic chronic kidney disease: Secondary | ICD-10-CM | POA: Diagnosis not present

## 2020-07-15 DIAGNOSIS — R197 Diarrhea, unspecified: Secondary | ICD-10-CM | POA: Insufficient documentation

## 2020-07-15 DIAGNOSIS — E1142 Type 2 diabetes mellitus with diabetic polyneuropathy: Secondary | ICD-10-CM

## 2020-07-15 DIAGNOSIS — I129 Hypertensive chronic kidney disease with stage 1 through stage 4 chronic kidney disease, or unspecified chronic kidney disease: Secondary | ICD-10-CM | POA: Diagnosis not present

## 2020-07-15 DIAGNOSIS — N183 Chronic kidney disease, stage 3 unspecified: Secondary | ICD-10-CM

## 2020-07-15 DIAGNOSIS — A0472 Enterocolitis due to Clostridium difficile, not specified as recurrent: Secondary | ICD-10-CM | POA: Diagnosis not present

## 2020-07-15 MED ORDER — HYDROCODONE-ACETAMINOPHEN 10-325 MG PO TABS: 1.0000 | ORAL_TABLET | Freq: Three times a day (TID) | ORAL | 0 refills | Status: DC | PRN

## 2020-07-15 NOTE — Progress Notes (Signed)
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     HPI:  She is a 79 year old short term rehab patient being seen for the management of her chronic illnesses: C.difficile colitis:      Diabetic peripheral neuropathy associated with type 2 diabetes mellitus  Hypertension associated with stage 3 chronic kidney disease due to type 2 diabetes mellitus:her diarrhea is slowly improving. There are no rpoerts of uncontrolled pain; no reports of anxiety.   Past Medical History:  Diagnosis Date   Anemia in chronic renal disease 12/30/2015   Anxiety    Arthritis    "all over" (01/19/2014)   Arthritis, lumbar spine    Carpal tunnel syndrome    Chronic bronchitis ([place])    "got it q yr for awhile; hasn't had it in awhile" (01/19/2014)   Chronic kidney disease (CKD) stage G3b/A1, moderately decreased glomerular filtration rate (GFR) between 30-44 mL/min/1.73 square meter and albuminuria creatinine ratio less than 30 mg/g 09/14/2009   Qualifier: Diagnosis of  By: Moshe Cipro MD, Margaret     Complication of anesthesia    combative   COPD (chronic obstructive pulmonary disease) ([place])    Depression    Diverticulosis 07/2004   Colonscopy Dr Gala Romney   Esophagitis, erosive 2009   Exertional shortness of breath    Gastroesophageal reflux    Headache(784.0)    "usually a couple times/wk" (01/19/2014)   Heart murmur    saw cardiology In [place], he told her she did not need to come back.   Hyperlipidemia    Hypertension    Impingement syndrome, shoulder    Iron deficiency anemia     Mitral regurgitation    Myasthenia gravis    Myasthenia gravis in remission ([place]) 11/24/2014   Obesity    OSA on CPAP    Negative on last sleep study   Pneumonia 01/2012   Pulmonary HTN ([place])    Schatzki's ring    Last EGD w/ dilation 02/08/11, 2009 & 2007   Seasonal allergies    Shoulder pain    Thyroid disease    "used to take RX; they took me off it" (01/19/2014)   Tobacco abuse    Type II diabetes mellitus ([place])     Past Surgical History:  Procedure Laterality Date   25 GAUGE PARS PLANA VITRECTOMY WITH 20 GAUGE MVR PORT Left 01/19/2014   Procedure: 25 GAUGE PARS PLANA VITRECTOMY WITH 20 GAUGE MVR PORT; MEMBRAME PEEL; SERUM PATCH; LASER TREATMENT; C3F8;  Surgeon: Hayden Pedro, MD;  Location: [facility];  Service: Ophthalmology;  Laterality: Left;   ABDOMINAL HYSTERECTOMY     AGILE CAPSULE N/A 08/05/2019   Procedure: AGILE CAPSULE;  Surgeon: Daneil Dolin, MD;  [facility];  Service: Endoscopy;  Laterality: N/A;  7:30am   APPENDECTOMY     CARPAL TUNNEL RELEASE Bilateral    CATARACT EXTRACTION W/PHACO Left 10/21/2013   Procedure: LEFT CATARACT EXTRACTION PHACO AND INTRAOCULAR LENS PLACEMENT (IOC);  Surgeon: Marylynn Pearson, MD;  Location: [facility];  Service: Ophthalmology;  Laterality: Left;   CHOLECYSTECTOMY     COLONOSCOPY  05/08/2012   [id number] and external hemorrhoids; colonic diverticulosis   COLONOSCOPY WITH PROPOFOL N/A 02/26/2019   two simple adenomas, diverticulosis,  and internal hemorrhoids.    ESOPHAGEAL DILATION     "more than 3 times" (01/19/2014)   ESOPHAGOGASTRODUODENOSCOPY  02/08/11   [place]k-Distal esophageal erosion consistent with mild erosive reflux   esophagitis/ Noncritical Schatzki ring, small hiatal hernia otherwise upper/ gastrointestinal tract appeared unremarkable, status post passage  of a Maloney dilation to biopsy disruption of the ring described   ESOPHAGOGASTRODUODENOSCOPY  04/2012   2 tandem incomplete distal esophagea  rings s/p dilation.    ESOPHAGOGASTRODUODENOSCOPY N/A 09/16/2014   Dr. Gala Romney: Schatzki ring status post dilation/disruption.  Hiatal hernia.   ESOPHAGOGASTRODUODENOSCOPY (EGD) WITH PROPOFOL N/A 12/08/2018   EGD with mild Schatzki ring s/p dilation, small hiatal hernia, otherwise normal   EYE SURGERY     cataracts, bilateral   GIVENS CAPSULE STUDY N/A 09/29/2019   Procedure: GIVENS CAPSULE STUDY;  Surgeon: Daneil Dolin, MD;  [facility];  Service: Endoscopy;  Laterality: N/A;  7:30am   INCONTINENCE SURGERY  08/26/09   [place]   JOINT REPLACEMENT     right total knee   MALONEY DILATION N/A 09/16/2014   Procedure: Venia Minks DILATION;  Surgeon: Daneil Dolin, MD;  [facility];  Service: Endoscopy;  Laterality: N/A;   MALONEY DILATION N/A 12/08/2018   Procedure: Venia Minks DILATION;  Surgeon: Daneil Dolin, MD;  [facility];  Service: Endoscopy;  Laterality: N/A;   PARS PLANA VITRECTOMY W/ REPAIR OF MACULAR HOLE Left 01/19/2014   POLYPECTOMY  02/26/2019   Procedure: POLYPECTOMY;  Surgeon: Danie Binder, MD;  [facility];  Service: Endoscopy;;  ascending colon polyps x2   SAVORY DILATION N/A 09/16/2014   Procedure: SAVORY DILATION;  Surgeon: Daneil Dolin, MD;  [facility];  Service: Endoscopy;  Laterality: N/A;   TONSILLECTOMY     TOTAL KNEE ARTHROPLASTY Right 05/13/07   Dr. Aline Brochure   TRIGGER FINGER RELEASE Right 06/26/2018   Procedure: RIGHT INDEX FINGER TRIGGER FINGER/A-1 PULLEY RELEASE;  Surgeon: Carole Civil, MD;  Location: [facility];  Service: Orthopedics;  Laterality: Right;    Social History   Socioeconomic History   Marital status: Single    Spouse name: Not on file   Number of children: 2   Years of education: Not on file   Highest education level: Not on file  Occupational History   Occupation: disabled    Employer: RETIRED  Tobacco Use   Smoking status: Former Smoker    Packs/day: 0.50     Years: 25.00    Pack years: 12.50    Types: Cigarettes    Quit date: 01/01/2004    Years since quitting: 16.5   Smokeless tobacco: Never Used  Scientific laboratory technician Use: Never used  Substance and Sexual Activity   Alcohol use: No   Drug use: No   Sexual activity: Never    Birth control/protection: Surgical  Other Topics Concern   Not on file  Social History Narrative   Patient lives at home by herself   Patient drinks one cup of coffee a day   Patient is right handed.   Social Determinants of Health   Financial Resource Strain:    Difficulty of Paying Living Expenses:   Food Insecurity:    Worried About Charity fundraiser in the Last  Year:    Arboriculturist in the Last Year:   Transportation Needs:    Film/video editor (Medical):    Lack of Transportation (Non-Medical):   Physical Activity:    Days of Exercise per Week:    Minutes of Exercise per Session:   Stress:    Feeling of Stress :   Social Connections:    Frequency of Communication with Friends and Family:    Frequency of Social Gatherings with Friends and Family:    Attends Religious Services:    Active Member of Clubs or Organizations:    Attends Music therapist:    Marital Status:   Intimate Partner Violence:    Fear of Current or Ex-Partner:    Emotionally Abused:    Physically Abused:    Sexually Abused:    Family History  Problem Relation Age of Onset   Cancer Mother    Heart disease Mother    Cancer Father    Heart disease Father    Diabetes Sister    Hypertension Brother    Heart disease Brother    Cancer Sister    Kidney failure Sister    Diabetes Son    Hypertension Son    Hypertension Daughter    Colon cancer Neg Hx       VITAL SIGNS BP 133/64    Pulse 61    Temp (!) 97.4 F (36.3 C) (Oral)    Ht '5\' 6"'  (1.676 m)    Wt 245 lb 14.4 oz (111.5 kg)    BMI 39.69 kg/m   Outpatient Encounter Medications as of 07/15/2020  Medication  Sig   acetaminophen (TYLENOL) 325 MG tablet Take 2 tablets (650 mg total) by mouth every 6 (six) hours as needed for mild pain, fever or headache (or Fever >/= 101).   Amino Acids-Protein Hydrolys (FEEDING SUPPLEMENT, PRO-STAT SUGAR FREE 64,) LIQD Take 30 mLs by mouth 3 (three) times daily with meals.   Balsam [place]-Castor Oil (VENELEX) OINT Apply topically in the morning, at noon, and at bedtime. ointment; - ; amt: ribbon; topical   brimonidine-timolol (COMBIGAN) 0.2-0.5 % ophthalmic solution Place 1 drop into both eyes every 12 (twelve) hours.   cetirizine (ZYRTEC) 10 MG tablet TAKE 1 TABLET BY MOUTH ONCE A DAY.   Cholecalciferol (VITAMIN D3) 2000 units TABS Take 2,000 Units by mouth at bedtime.   cholestyramine (QUESTRAN) 4 GM/DOSE powder Take 4 g by mouth daily. For stool bulking   citalopram (CELEXA) 10 MG tablet TAKE 1 TABLET BY MOUTH ONCE A DAY.   Cranberry 1000 MG CAPS Take 1,000 mg by mouth daily.   cycloSPORINE (RESTASIS) 0.05 % ophthalmic emulsion Place 1 drop into both eyes 2 (two) times daily.    diltiazem (CARDIZEM CD) 120 MG 24 hr capsule Take 1 capsule (120 mg total) by mouth daily.   ezetimibe (ZETIA) 10 MG tablet TAKE 1 TABLET BY MOUTH ONCE A DAY.   famotidine (PEPCID) 40 MG tablet Take 40 mg by mouth every evening.   fluticasone (FLONASE) 50 MCG/ACT nasal spray PLACE 2 SPRAYS INTO BOTH NOSTRILS DAILY.   gabapentin (NEURONTIN) 300 MG capsule TAKE (1) CAPSULE BY MOUTH TWICE DAILY.   hydrocortisone (ANUSOL-HC) 2.5 % rectal cream Place 1 application rectally 2 (two) times daily.   insulin glargine (LANTUS SOLOSTAR) 100 UNIT/ML Solostar Pen Inject 6 Units into the skin at bedtime.    insulin lispro (HUMALOG KWIKPEN) 100 UNIT/ML KwikPen insulin pen; 100 unit/mL; amt: Per Sliding Scale; If  Blood Sugar is 0 to 149, give 0 Units. If Blood Sugar is greater than 149, give 3 Units. subcutaneous Before Meals   linagliptin (TRADJENTA) 5 MG TABS tablet Take 1 tablet (5 mg  total) by mouth daily.   losartan (COZAAR) 25 MG tablet Take 1 tablet (25 mg total) by mouth daily.   Menthol, Topical Analgesic, (BIOFREEZE) 4 % GEL Apply topically daily. Apply a small amont; topically to her lower legs and knees    Multiple Vitamin (MULTIVITAMIN WITH MINERALS) TABS tablet Take 1 tablet by mouth at bedtime.   NON FORMULARY Diet: _____ Regular, ______ NAS, _______Consistent Carbohydrate, _______NPO ___X__Other :SOFT DIET   nystatin (NYSTATIN) powder Apply 1 application topically daily as needed. Special Instructions: fungal rash to groin and inguinal folds   ondansetron (ZOFRAN) 4 MG tablet Take 1 tablet (4 mg total) by mouth every 8 (eight) hours as needed for nausea or vomiting.   predniSONE (DELTASONE) 5 MG tablet Take 1 tablet (5 mg total) by mouth daily.   PROAIR HFA 108 (90 Base) MCG/ACT inhaler INHALE 2 PUFFS BY MOUTH EVERY 6 HOURS AS NEEDED .   Probiotic Product (RISA-BID PROBIOTIC) TABS Take 1 tablet by mouth 2 (two) times daily.   pyridostigmine (MESTINON) 60 MG tablet TAKE (1) TABLET BY MOUTH THREE TIMES A DAY.   rosuvastatin (CRESTOR) 40 MG tablet TAKE (1) TABLET BY MOUTH AT BEDTIME.   solifenacin (VESICARE) 10 MG tablet TAKE 1 TABLET BY MOUTH ONCE A DAY.   venlafaxine XR (EFFEXOR-XR) 150 MG 24 hr capsule TAKE 1 CAPSULE BY MOUTH DAILY.   No facility-administered encounter medications on file as of 07/15/2020.     SIGNIFICANT DIAGNOSTIC EXAMS   PREVIOUS   06-12-20: ct of abdomen and pelvis 1. Diffuse circumferential wall thickening of much of the descending colon and sigmoid colon with multiple enlarged adjacent regional lymph nodes. Findings are concerning for an infectious or inflammatory colitis. a follow-up colonoscopy is recommended as an outpatient. 2. Complex cystic mass in the right hemipelvis likely associated with the right ovary measuring up to 3.9 cm. Follow-up with a nonemergent outpatient pelvic ultrasound is recommended. 3. Large  hiatal hernia. 4. Nonobstructive right nephrolithiasis. Aortic Atherosclerosis  NO NEW EXAMS.   LABS REVIEWED PREVIOUS  06-12-20: wbc 17.7; hgb 9.1; hct 29.0; mcv 84.8 plt 560; glucose 250; bun 35; creat 1.68; k+ 3.2; na++ 135; ca 8.2 liver normal albumin 2.3 mag 1.5 phos 3.0 hgb a1c 6.9 06-14-20: wbc 13.9; hgb 8.0; hct 25.1; mcv 84.2 plt 415; glucose 99; bun 22; creat 1.21; k+ 3.2; na++ 135; ca 7.3 06-15-20: + c-diff 06-17-20: wbc 14.8; hgb 8.4; hct 25.7; mcv 83.2 plt 355; glucose 82; bun 16; creat 1.22; k+ 4.5; na++ 135; ca 7.8 phos 2.4 albumin 1.8 mag 1.6 06-20-20: wbc 14.5; hgb 7.5; hct 22.9; mcv 81.2 plt 410; glucose 80; bun 16; creat 1.18; k+ 3.9; na++ 134; ca 7.7 liver normal albumin 1.5 mag 1.8  07-03-20: wbc 109; hgb 6.3; hct 20.5; mcv 90.7 plt 491; glucose 105; bun 24; creat 0.78; k+ 3.2; na++ 137; ca 7.8 ast 54; alt 70; alk phos 138; albumin 1.7 07-04-20: hgb 8.9; hct 29.4 07-05-20: wbc 10.7; hgb 8.7; hct 28.7; mcv 92.6 plt 400; glucose 59; bun 17; creat 0.61; k+ 3.9; na++ 140; ca 7.8 mag 1.7  07-06-20: wbc 9.2; hgb 9.2; hct 31.1; mcv 93.7 plt 399  07-11-20: wbc 10.6; hgb 8.9; hct 28.9; mcv 90.9 plt 371 07-13-20: c-diff: negative   NO NEW LABS.  Review of Systems  Constitutional: Negative for malaise/fatigue.  Respiratory: Negative for cough and shortness of breath.   Cardiovascular: Negative for chest pain, palpitations and leg swelling.  Gastrointestinal: Positive for diarrhea. Negative for abdominal pain, constipation and heartburn.  Musculoskeletal: Negative for back pain, joint pain and myalgias.  Skin: Negative.   Neurological: Negative for dizziness.  Psychiatric/Behavioral: The patient is not nervous/anxious.     Physical Exam Constitutional:      General: She is not in acute distress.    Appearance: She is well-developed. She is morbidly obese. She is not diaphoretic.  Neck:     Thyroid: No thyromegaly.  Cardiovascular:     Rate and Rhythm: Normal rate and regular  rhythm.     Pulses: Normal pulses.     Heart sounds: Normal heart sounds.  Pulmonary:     Effort: Pulmonary effort is normal. No respiratory distress.     Breath sounds: Normal breath sounds.  Abdominal:     General: Bowel sounds are normal. There is no distension.     Palpations: Abdomen is soft.     Tenderness: There is no abdominal tenderness.  Musculoskeletal:        General: Normal range of motion.     Cervical back: Neck supple.     Right lower leg: No edema.     Left lower leg: No edema.  Lymphadenopathy:     Cervical: No cervical adenopathy.  Skin:    General: Skin is warm and dry.  Neurological:     Mental Status: She is alert and oriented to person, place, and time.  Psychiatric:        Mood and Affect: Mood normal.       ASSESSMENT/ PLAN:  TODAY  1. C.difficile colitis: antigen negative will continue questran 4 gm daily for bulking and will monitor   2. Diabetic peripheral neuropathy associated with type 2 diabetes mellitus is stable will continue gabapentin 300 mg twice daily has been on long term vicodin 10/325 mg twice daily as needed for pain management.   3. Hypertension associated with stage 3 chronic kidney disease due to type 2 diabetes mellitus: is stable b/p 133/64 will continue cozaar 25 mg daily cardizem cd 120 mg daily     PREVIOUS   4. Obstructive sleep apnea is stable will continue CPAP nightly   5. Chronic non-seasonal allergic rhinitis: is stable will continue flonase daily and zyrtec 10 mg daily   6. Gastroesophageal reflux disease without esophagitis: is stable is off PPI will continue pepcid 40 mg daily   7. Type 2 diabetes mellitus with diabetic neuropathy with long term current use of insulin: is stable hgb a1c 6.9 will continue lantus 6 units nightly and humalog 3 units with meals on 07-11-20 will start tradjenta 5 mg daily   8. Hyperlipidemia associated with type 2 diabetes mellitus is stable will continue crestor 40 mg daily zetia 10  mg daily   9. Myasthenia gravis in remission is stable will continue mestinon 60 mg three times daily prednisone 5 mg daily   10. Depression with anxiety is stable will continue effexor xr 150 mg daily celexa 10 mg daily   11. Urinary urgency is stable will continue vesicare 10 mg daily   12. Pelvic mass in female: u/s done awaiting MRI   13. Acute blood anemia: stable hgb 9.2; will continue to monitor her status.   14. Chronic obstructive pulmonary disease unspecified COPD type: is stable will continue albuterol 2 puffs every  6 hours as needed  15. Increased intraocular pressure bilateral is stable will continue combigan to both eyes twice daily   16. Protein calorie malnutrition severe: albumin 1.5 will continue prostat 30 ml three times daily        MD is aware of resident's narcotic use and is in agreement with current plan of care. We will attempt to wean resident as appropriate.  Ok Edwards NP [facility]  Contact [phone] Monday through Friday 8am- 5pm  After hours call [phone]

## 2020-07-16 ENCOUNTER — Encounter (HOSPITAL_COMMUNITY)
Admission: RE | Admit: 2020-07-16 | Discharge: 2020-07-16 | Disposition: A | Discharge status: Home / Self Care | Payer: Medicare Other | Source: Skilled Nursing Facility | Attending: *Deleted | Admitting: *Deleted

## 2020-07-16 LAB — BASIC METABOLIC PANEL
Anion gap: 9 (ref 5–15)
BUN: 19 mg/dL (ref 8–23)
CO2: 23 mmol/L (ref 22–32)
Calcium: 7.8 mg/dL — ABNORMAL LOW (ref 8.9–10.3)
Chloride: 104 mmol/L (ref 98–111)
Creatinine, Ser: 0.99 mg/dL (ref 0.44–1.00)
GFR calc Af Amer: >60 mL/min (ref >60)
GFR calc non Af Amer: 55 mL/min — ABNORMAL LOW (ref >60)
Glucose, Bld: 202 mg/dL — ABNORMAL HIGH (ref 70–99)
Potassium: 3.5 mmol/L (ref 3.5–5.1)
Sodium: 136 mmol/L (ref 135–145)

## 2020-07-16 LAB — CBC WITH DIFFERENTIAL/PLATELET
Abs Immature Granulocytes: 0.06 10*3/uL (ref 0.00–0.07)
Basophils Absolute: 0 10*3/uL (ref 0.0–0.1)
Basophils Relative: 0 %
Eosinophils Absolute: 0.1 10*3/uL (ref 0.0–0.5)
Eosinophils Relative: 1 %
HCT: 28.4 % — ABNORMAL LOW (ref 36.0–46.0)
Hemoglobin: 8.4 g/dL — ABNORMAL LOW (ref 12.0–15.0)
Immature Granulocytes: 1 %
Lymphocytes Relative: 9 %
Lymphs Abs: 0.9 10*3/uL (ref 0.7–4.0)
MCH: 28.1 pg (ref 26.0–34.0)
MCHC: 29.6 g/dL — ABNORMAL LOW (ref 30.0–36.0)
MCV: 95 fL (ref 80.0–100.0)
Monocytes Absolute: 1 10*3/uL (ref 0.1–1.0)
Monocytes Relative: 10 %
Neutro Abs: 7.9 10*3/uL — ABNORMAL HIGH (ref 1.7–7.7)
Neutrophils Relative %: 79 %
Platelets: 304 10*3/uL (ref 150–400)
RBC: 2.99 MIL/uL — ABNORMAL LOW (ref 3.87–5.11)
RDW: 18.5 % — ABNORMAL HIGH (ref 11.5–15.5)
WBC: 9.9 10*3/uL (ref 4.0–10.5)
nRBC: 0 % (ref 0.0–0.2)

## 2020-07-18 ENCOUNTER — Non-Acute Institutional Stay (SKILLED_NURSING_FACILITY): Payer: Medicare Other | Admitting: Internal Medicine

## 2020-07-18 ENCOUNTER — Encounter (HOSPITAL_COMMUNITY)
Admission: RE | Admit: 2020-07-18 | Discharge: 2020-07-18 | Disposition: A | Discharge status: Home / Self Care | Payer: Medicare Other | Source: Skilled Nursing Facility | Attending: *Deleted | Admitting: *Deleted

## 2020-07-18 ENCOUNTER — Encounter: Payer: Self-pay | Admitting: Internal Medicine

## 2020-07-18 DIAGNOSIS — R0902 Hypoxemia: Secondary | ICD-10-CM

## 2020-07-18 DIAGNOSIS — D649 Anemia, unspecified: Secondary | ICD-10-CM

## 2020-07-18 DIAGNOSIS — J440 Chronic obstructive pulmonary disease with acute lower respiratory infection: Secondary | ICD-10-CM

## 2020-07-18 DIAGNOSIS — D72829 Elevated white blood cell count, unspecified: Secondary | ICD-10-CM

## 2020-07-18 DIAGNOSIS — J209 Acute bronchitis, unspecified: Secondary | ICD-10-CM | POA: Diagnosis not present

## 2020-07-18 LAB — CBC
HCT: 30.8 % — ABNORMAL LOW (ref 36.0–46.0)
Hemoglobin: 9.5 g/dL — ABNORMAL LOW (ref 12.0–15.0)
MCH: 28.2 pg (ref 26.0–34.0)
MCHC: 30.8 g/dL (ref 30.0–36.0)
MCV: 91.4 fL (ref 80.0–100.0)
Platelets: 415 10*3/uL — ABNORMAL HIGH (ref 150–400)
RBC: 3.37 MIL/uL — ABNORMAL LOW (ref 3.87–5.11)
RDW: 18.3 % — ABNORMAL HIGH (ref 11.5–15.5)
WBC: 11.8 10*3/uL — ABNORMAL HIGH (ref 4.0–10.5)
nRBC: 0 % (ref 0.0–0.2)

## 2020-07-18 NOTE — Patient Instructions (Signed)
See assessment and plan under each diagnosis acutely for this visit

## 2020-07-18 NOTE — Progress Notes (Signed)
NURSING HOME LOCATION:  [facility] ROOM NUMBER: 153/P   CODE STATUS:  Full Code PCP: Fayrene Helper, MD   This is a nursing facility follow up for specific acute issue of multiple issues.  Interim medical record and care since last [place] visit was updated with review of diagnostic studies and change in clinical status since last visit were documented.  HPI: On 7/16 Robitussin was initiated for subjective congestion.  She has had increasing "breathing problems"" necessitating 2 L of nasal oxygen continuous flow, as O2 sats had dropped into the 70%+ range on room air.  With supplemental oxygen O2 sats are > 90%.  Staff also reports that she has been somewhat confused. Her anemia had progressed slightly when last checked with H/H of 8.4/28.4.  Today those values are improved with values of 9.5/30.8.  GI has been following the patient and ultimately plans colonoscopy.  The most current office note was reviewed.  The follow-up C. difficile serologies were negative. Today her white count has risen from 9.9 up to 11,800 with a slight left shift with 7.9 absolute neutrophils.  Significant history includes obstructive sleep apnea without CPAP, seasonal allergies, history of tobacco use (apparently 12.5 pack years), and pulmonary hypertension. Epic imaging results revealed chronic bronchitic changes on the chest x-ray 04/29/2020.  Review of systems: She exhibits mild confusion.  She stated it was Sunday, not Monday, and she was finally able to name the month as July.  She could not give me the day of the month.  When asked the year she had multiple incorrect responses.  Initially she said "34, then 20, then 2020 and finally 2021". She states that she has had a sore throat for several days but this resolved last night.  She describes shortness of breath at rest , worsening with ambulation.  She is unaware of any snoring or apnea.  She denies any upper respiratory tract symptoms  of infection or extrinsic process but describes decreased smell for extended period of time.  She did take the COVID-19 vaccine. She does describe the production of sputum which was initially slightly yellow but now white and clear. She describes her bowel movements are now "clear" and slightly formed this morning.  Intermittently she continues to note blood in the stool.  Constitutional: No fever, significant weight change Eyes: No redness, discharge, pain, vision change ENT/mouth: No nasal congestion,  purulent discharge, earache, change in hearing Cardiovascular: No chest pain, palpitations, paroxysmal nocturnal dyspnea, claudication  Respiratory: No  hemoptysis   Gastrointestinal: No heartburn, dysphagia, abdominal pain, nausea /vomiting, melena Genitourinary: No dysuria, hematuria, pyuria, incontinence, nocturia Musculoskeletal: No joint stiffness, joint swelling, pain Dermatologic: No rash, pruritus, change in appearance of skin Neurologic: No dizziness, headache, syncope, seizures Psychiatric: No significant anxiety, depression, insomnia, anorexia Endocrine: No change in hair/skin/nails, excessive thirst, excessive hunger, excessive urination  Hematologic/lymphatic: No significant bruising, lymphadenopathy Allergy/immunology: No itchy/watery eyes, significant sneezing, urticaria, angioedema  Physical exam:  Pertinent or positive findings: She appears somewhat lethargic.  As she sits in the chair she rocks back and forth.  She is markedly hoarse.  Proptosis is present.  Clear rhinitis is present.  She is on nasal oxygen.  She is not wearing the upper plate.  The oropharynx is crowded.  Slight hirsutism of the chin is present.  Heart rhythm is irregular.  Breath sounds are decreased over the lung fields superiorly.  Inferiorly she exhibits dry almost rub like rales and rhonchi with scattered inspiratory  popping.  She has 1+ edema of the right lower extremity and 1/2+ on the left.  Pedal  pulses are decreased.  General appearance: Adequately nourished; no acute distress, increased work of breathing is present.   Lymphatic: No lymphadenopathy about the head, neck, axilla. Eyes: No conjunctival inflammation or lid edema is present. There is no scleral icterus. Ears:  External ear exam shows no significant lesions or deformities.   Nose:  External nasal examination shows no deformity or inflammation. Nasal mucosa are pink and moist without lesions, exudates Oral exam:  Lips and gums are healthy appearing. There is no oropharyngeal erythema or exudate. Neck:  No thyromegaly, masses, tenderness noted.    Heart:  No gallop, murmur, click, rub .  Lungs:  without wheezes. Abdomen: Bowel sounds are normal. Abdomen is soft and nontender with no organomegaly, hernias, masses. GU: Deferred  Extremities:  No cyanosis, clubbing  Neurologic exam :Balance, Rhomberg, finger to nose testing could not be completed due to clinical state Skin: Warm & dry w/o tenting. No significant lesions or rash.  See summary under each active problem in the Problem List with associated updated therapeutic plan

## 2020-07-19 ENCOUNTER — Ambulatory Visit: Payer: Medicare Other | Admitting: Cardiology

## 2020-07-28 ENCOUNTER — Ambulatory Visit: Payer: Medicare Other | Admitting: Gastroenterology

## 2020-07-28 ENCOUNTER — Encounter: Payer: Self-pay | Admitting: Adult Health

## 2020-07-28 ENCOUNTER — Non-Acute Institutional Stay (SKILLED_NURSING_FACILITY): Payer: Medicare Other | Admitting: Adult Health

## 2020-07-28 DIAGNOSIS — N183 Chronic kidney disease, stage 3 unspecified: Secondary | ICD-10-CM | POA: Diagnosis not present

## 2020-07-28 DIAGNOSIS — E1122 Type 2 diabetes mellitus with diabetic chronic kidney disease: Secondary | ICD-10-CM | POA: Diagnosis not present

## 2020-07-28 DIAGNOSIS — A0472 Enterocolitis due to Clostridium difficile, not specified as recurrent: Secondary | ICD-10-CM

## 2020-07-28 DIAGNOSIS — I129 Hypertensive chronic kidney disease with stage 1 through stage 4 chronic kidney disease, or unspecified chronic kidney disease: Secondary | ICD-10-CM | POA: Diagnosis not present

## 2020-07-28 NOTE — Progress Notes (Signed)
Location:    [facility] Room Number: 153/P Place of Service:  SNF (31)   CODE STATUS: Full Code  No Known Allergies  Chief Complaint  Patient presents with  . Acute Visit    Care Plan Meeting    HPI:  We have come together for her care plan meeting. BIMS 10/15 mood 8/30. Family present. Her goal is to return home when she is able. Her appetite is good; weight is stable. No reports of falls. She does not attend activities; family will encourage her to do so. She is ambulating 60 feet; and is working on steps has done 4 steps. Is continent of bowel and bladder; was able to use toilet with assistance. She has not used her vicodin in 7 days will stop this medications. Her stools are more formed. 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 Anemia in chronic renal disease 12/30/2015  . Anxiety   . Arthritis    "all over" (01/19/2014)  . Arthritis, lumbar spine   . Carpal tunnel syndrome   . Chronic bronchitis ([place])    "got it q yr for awhile; hasn't had it in awhile" (01/19/2014)  . Chronic kidney disease (CKD) stage G3b/A1, moderately decreased glomerular filtration rate (GFR) between 30-44 mL/min/1.73 square meter and albuminuria creatinine ratio less than 30 mg/g 09/14/2009   Qualifier: Diagnosis of  By: Moshe Cipro MD, Joycelyn Schmid    . Complication of anesthesia    combative  . COPD (chronic obstructive pulmonary disease) ([place])   . Depression   . Diverticulosis 07/2004   Colonscopy Dr Gala Romney  . Esophagitis, erosive 2009  . Exertional shortness of breath   . Gastroesophageal reflux   . EHUDJSHF(026.3)    "usually a couple times/wk" (01/19/2014)  . Heart murmur    saw cardiology In [place], he told her she did not need to come back.  . Hyperlipidemia     . Hypertension   . Impingement syndrome, shoulder   . Iron deficiency anemia   . Mitral regurgitation   . Myasthenia gravis   . Myasthenia gravis in remission ([place]) 11/24/2014  . Obesity   . OSA on CPAP    Negative on last sleep study  . Pneumonia 01/2012  . Pulmonary HTN ([place])   . Schatzki's ring    Last EGD w/ dilation 02/08/11, 2009 & 2007  . Seasonal allergies   . Shoulder pain   . Thyroid disease    "used to take RX; they took me off it" (01/19/2014)  . Tobacco abuse   . Type II diabetes mellitus ([place])     Past Surgical History:  Procedure Laterality Date  . [place] VITRECTOMY WITH 20 GAUGE MVR PORT Left 01/19/2014   Procedure: 25 GAUGE PARS PLANA VITRECTOMY WITH 20 GAUGE MVR PORT; MEMBRAME PEEL; SERUM PATCH; LASER TREATMENT; C3F8;  Surgeon: Hayden Pedro, MD;  Location: [facility];  Service: Ophthalmology;  Laterality: Left;  . ABDOMINAL HYSTERECTOMY    . AGILE CAPSULE N/A 08/05/2019   Procedure: AGILE CAPSULE;  Surgeon: Daneil Dolin, MD;  [facility];  Service: Endoscopy;  Laterality: N/A;  7:30am  . APPENDECTOMY    . CARPAL TUNNEL RELEASE Bilateral   . CATARACT EXTRACTION W/PHACO Left 10/21/2013   Procedure: LEFT CATARACT EXTRACTION  PHACO AND INTRAOCULAR LENS PLACEMENT (IOC);  Surgeon: Marylynn Pearson, MD;  Location: [facility];  Service: Ophthalmology;  Laterality: Left;  . CHOLECYSTECTOMY    . COLONOSCOPY  05/08/2012   FYT:WKMQKMMN and external hemorrhoids; colonic diverticulosis  . COLONOSCOPY WITH PROPOFOL N/A 02/26/2019   two simple adenomas, diverticulosis,  and internal hemorrhoids.   . ESOPHAGEAL DILATION     "more than 3 times" (01/19/2014)  . ESOPHAGOGASTRODUODENOSCOPY  02/08/11   [place]-Distal esophageal erosion consistent with mild erosive reflux   esophagitis/ Noncritical Schatzki ring, small hiatal hernia otherwise upper/ gastrointestinal tract appeared unremarkable, status post passage  of a Maloney dilation to biopsy disruption of the ring described   . ESOPHAGOGASTRODUODENOSCOPY  04/2012   2 tandem incomplete distal esophagea rings s/p dilation.   . ESOPHAGOGASTRODUODENOSCOPY N/A 09/16/2014   Dr. Gala Romney: Schatzki ring status post dilation/disruption.  Hiatal hernia.  Marland Kitchen ESOPHAGOGASTRODUODENOSCOPY (EGD) WITH PROPOFOL N/A 12/08/2018   EGD with mild Schatzki ring s/p dilation, small hiatal hernia, otherwise normal  . EYE SURGERY     cataracts, bilateral  . GIVENS CAPSULE STUDY N/A 09/29/2019   Procedure: GIVENS CAPSULE STUDY;  Surgeon: Daneil Dolin, MD;  [facility];  Service: Endoscopy;  Laterality: N/A;  7:30am  . INCONTINENCE SURGERY  08/26/09   [place]  . JOINT REPLACEMENT     right total knee  . MALONEY DILATION N/A 09/16/2014   Procedure: Venia Minks DILATION;  Surgeon: Daneil Dolin, MD;  [facility];  Service: Endoscopy;  Laterality: N/A;  . Venia Minks DILATION N/A 12/08/2018   Procedure: Venia Minks DILATION;  Surgeon: Daneil Dolin, MD;  [facility];  Service: Endoscopy;  Laterality: N/A;  . PARS PLANA VITRECTOMY W/ REPAIR OF MACULAR HOLE Left 01/19/2014  . POLYPECTOMY  02/26/2019   Procedure: POLYPECTOMY;  Surgeon: Danie Binder, MD;  [facility];  Service: Endoscopy;;  ascending colon polyps x2  . SAVORY DILATION N/A 09/16/2014   Procedure: SAVORY DILATION;  Surgeon: Daneil Dolin, MD;  [facility];  Service: Endoscopy;  Laterality: N/A;  . TONSILLECTOMY    . TOTAL KNEE ARTHROPLASTY Right 05/13/07   Dr. Aline Brochure  . TRIGGER FINGER RELEASE Right 06/26/2018   Procedure: RIGHT INDEX FINGER TRIGGER FINGER/A-1 PULLEY RELEASE;  Surgeon: Carole Civil, MD;  Location[facility];  Service: Orthopedics;  Laterality: Right;    Social History   Socioeconomic History  . Marital status: Single    Spouse name: Not on file  . Number of children: 2  . Years of education: Not on file  . Highest education level: Not on file  Occupational History  . Occupation: disabled    Employer:  RETIRED  Tobacco Use  . Smoking status: Former Smoker    Packs/day: 0.50    Years: 25.00    Pack years: 12.50    Types: Cigarettes    Quit date: 01/01/2004    Years since quitting: 16.5  . Smokeless tobacco: Never Used  Vaping Use  . Vaping Use: Never used  Substance and Sexual Activity  . Alcohol use: No  . Drug use: No  . Sexual activity: Never    Birth control/protection: Surgical  Other Topics Concern  . Not on file  Social History Narrative   Patient lives at home by herself   Patient drinks one cup of coffee a day   Patient is right handed.   Social Determinants of Health   Financial Resource Strain:   . Difficulty of  Paying Living Expenses:   Food Insecurity:   . Worried About Charity fundraiser in the Last Year:   . Arboriculturist in the Last Year:   Transportation Needs:   . Film/video editor (Medical):   Marland Kitchen Lack of Transportation (Non-Medical):   Physical Activity:   . Days of Exercise per Week:   . Minutes of Exercise per Session:   Stress:   . Feeling of Stress :   Social Connections:   . Frequency of Communication with Friends and Family:   . Frequency of Social Gatherings with Friends and Family:   . Attends Religious Services:   . Active Member of Clubs or Organizations:   . Attends Archivist Meetings:   Marland Kitchen Marital Status:   Intimate Partner Violence:   . Fear of Current or Ex-Partner:   . Emotionally Abused:   Marland Kitchen Physically Abused:   . Sexually Abused:    Family History  Problem Relation Age of Onset  . Cancer Mother   . Heart disease Mother   . Cancer Father   . Heart disease Father   . Diabetes Sister   . Hypertension Brother   . Heart disease Brother   . Cancer Sister   . Kidney failure Sister   . Diabetes Son   . Hypertension Son   . Hypertension Daughter   . Colon cancer Neg Hx       VITAL SIGNS BP (!) 118/62   Pulse 68   Temp 98.4 F (36.9 C) (Oral)   Resp 20   Ht '5\' 6"'  (1.676 m)   Wt (!) 245 lb 14.4 oz  (111.5 kg)   SpO2 95%   BMI 39.69 kg/m   Outpatient Encounter Medications as of 07/28/2020  Medication Sig  . acetaminophen (TYLENOL) 325 MG tablet Take 2 tablets (650 mg total) by mouth every 6 (six) hours as needed for mild pain, fever or headache (or Fever >/= 101).  . Amino Acids-Protein Hydrolys (FEEDING SUPPLEMENT, PRO-STAT SUGAR FREE 64,) LIQD Take 30 mLs by mouth 3 (three) times daily with meals.  Roseanne Kaufman [place]-Castor Oil (VENELEX) OINT Apply topically in the morning, at noon, and at bedtime. ointment; - ; amt: ribbon; topical  . brimonidine-timolol (COMBIGAN) 0.2-0.5 % ophthalmic solution Place 1 drop into both eyes every 12 (twelve) hours.  . cetirizine (ZYRTEC) 10 MG tablet TAKE 1 TABLET BY MOUTH ONCE A DAY.  Marland Kitchen Cholecalciferol (VITAMIN D3) 2000 units TABS Take 2,000 Units by mouth at bedtime.  . cholestyramine (QUESTRAN) 4 GM/DOSE powder Take 4 g by mouth daily. For stool bulking  . citalopram (CELEXA) 10 MG tablet TAKE 1 TABLET BY MOUTH ONCE A DAY.  Marland Kitchen Cranberry 1000 MG CAPS Take 1,000 mg by mouth daily.  . cycloSPORINE (RESTASIS) 0.05 % ophthalmic emulsion Place 1 drop into both eyes 2 (two) times daily.   Marland Kitchen diltiazem (CARDIZEM CD) 120 MG 24 hr capsule Take 1 capsule (120 mg total) by mouth daily.  Marland Kitchen ezetimibe (ZETIA) 10 MG tablet TAKE 1 TABLET BY MOUTH ONCE A DAY.  . famotidine (PEPCID) 40 MG tablet Take 40 mg by mouth every evening.  . fluticasone (FLONASE) 50 MCG/ACT nasal spray PLACE 2 SPRAYS INTO BOTH NOSTRILS DAILY.  Marland Kitchen gabapentin (NEURONTIN) 300 MG capsule TAKE (1) CAPSULE BY MOUTH TWICE DAILY.  Marland Kitchen HYDROcodone-acetaminophen (NORCO) 10-325 MG tablet Take 1 tablet by mouth 2 (two) times daily as needed. For chronic pain management  . hydrocortisone (ANUSOL-HC) 2.5 % rectal cream  Place 1 application rectally 2 (two) times daily.  . insulin glargine (LANTUS SOLOSTAR) 100 UNIT/ML Solostar Pen Inject 6 Units into the skin at bedtime.   . insulin lispro (HUMALOG KWIKPEN) 100 UNIT/ML  KwikPen insulin pen; 100 unit/mL; amt: Per Sliding Scale; If Blood Sugar is 0 to 149, give 0 Units. If Blood Sugar is greater than 149, give 3 Units. subcutaneous Before Meals  . linagliptin (TRADJENTA) 5 MG TABS tablet Take 1 tablet (5 mg total) by mouth daily.  Marland Kitchen losartan (COZAAR) 25 MG tablet Take 1 tablet (25 mg total) by mouth daily.  . Multiple Vitamin (MULTIVITAMIN WITH MINERALS) TABS tablet Take 1 tablet by mouth at bedtime.  . NON FORMULARY Diet: _____ Regular, ______ NAS, _______Consistent Carbohydrate, _______NPO ___X__Other :SOFT DIET  . nystatin (NYSTATIN) powder Apply 1 application topically daily as needed. Special Instructions: fungal rash to groin and inguinal folds  . ondansetron (ZOFRAN) 4 MG tablet Take 1 tablet (4 mg total) by mouth every 8 (eight) hours as needed for nausea or vomiting.  . predniSONE (DELTASONE) 5 MG tablet Take 1 tablet (5 mg total) by mouth daily.  Marland Kitchen PROAIR HFA 108 (90 Base) MCG/ACT inhaler INHALE 2 PUFFS BY MOUTH EVERY 6 HOURS AS NEEDED .  Marland Kitchen Probiotic Product (RISA-BID PROBIOTIC) TABS Take 1 tablet by mouth 2 (two) times daily.  Marland Kitchen pyridostigmine (MESTINON) 60 MG tablet TAKE (1) TABLET BY MOUTH THREE TIMES A DAY.  . rosuvastatin (CRESTOR) 40 MG tablet TAKE (1) TABLET BY MOUTH AT BEDTIME.  Marland Kitchen solifenacin (VESICARE) 10 MG tablet TAKE 1 TABLET BY MOUTH ONCE A DAY.  Marland Kitchen venlafaxine XR (EFFEXOR-XR) 150 MG 24 hr capsule TAKE 1 CAPSULE BY MOUTH DAILY.  . [DISCONTINUED] Menthol, Topical Analgesic, (BIOFREEZE) 4 % GEL Apply topically daily. Apply a small amont; topically to her lower legs and knees    No facility-administered encounter medications on file as of 07/28/2020.     SIGNIFICANT DIAGNOSTIC EXAMS   PREVIOUS   06-12-20: ct of abdomen and pelvis 1. Diffuse circumferential wall thickening of much of the descending colon and sigmoid colon with multiple enlarged adjacent regional lymph nodes. Findings are concerning for an infectious or inflammatory  colitis. a follow-up colonoscopy is recommended as an outpatient. 2. Complex cystic mass in the right hemipelvis likely associated with the right ovary measuring up to 3.9 cm. Follow-up with a nonemergent outpatient pelvic ultrasound is recommended. 3. Large hiatal hernia. 4. Nonobstructive right nephrolithiasis. Aortic Atherosclerosis  NO NEW EXAMS.   LABS REVIEWED PREVIOUS  06-12-20: wbc 17.7; hgb 9.1; hct 29.0; mcv 84.8 plt 560; glucose 250; bun 35; creat 1.68; k+ 3.2; na++ 135; ca 8.2 liver normal albumin 2.3 mag 1.5 phos 3.0 hgb a1c 6.9 06-14-20: wbc 13.9; hgb 8.0; hct 25.1; mcv 84.2 plt 415; glucose 99; bun 22; creat 1.21; k+ 3.2; na++ 135; ca 7.3 06-15-20: + c-diff 06-17-20: wbc 14.8; hgb 8.4; hct 25.7; mcv 83.2 plt 355; glucose 82; bun 16; creat 1.22; k+ 4.5; na++ 135; ca 7.8 phos 2.4 albumin 1.8 mag 1.6 06-20-20: wbc 14.5; hgb 7.5; hct 22.9; mcv 81.2 plt 410; glucose 80; bun 16; creat 1.18; k+ 3.9; na++ 134; ca 7.7 liver normal albumin 1.5 mag 1.8  07-03-20: wbc 109; hgb 6.3; hct 20.5; mcv 90.7 plt 491; glucose 105; bun 24; creat 0.78; k+ 3.2; na++ 137; ca 7.8 ast 54; alt 70; alk phos 138; albumin 1.7 07-04-20: hgb 8.9; hct 29.4 07-05-20: wbc 10.7; hgb 8.7; hct 28.7; mcv 92.6 plt 400; glucose  59; bun 17; creat 0.61; k+ 3.9; na++ 140; ca 7.8 mag 1.7  07-06-20: wbc 9.2; hgb 9.2; hct 31.1; mcv 93.7 plt 399  07-11-20: wbc 10.6; hgb 8.9; hct 28.9; mcv 90.9 plt 371 07-13-20: c-diff: negative   NO NEW LABS.   Review of Systems  Constitutional: Negative for malaise/fatigue.  Respiratory: Negative for cough and shortness of breath.   Cardiovascular: Negative for chest pain, palpitations and leg swelling.  Gastrointestinal: Negative for abdominal pain, constipation and heartburn.  Musculoskeletal: Negative for back pain, joint pain and myalgias.  Skin: Negative.   Neurological: Negative for dizziness.  Psychiatric/Behavioral: The patient is not nervous/anxious.     Physical Exam Constitutional:       General: She is not in acute distress.    Appearance: She is well-developed. She is morbidly obese. She is not diaphoretic.  Neck:     Thyroid: No thyromegaly.  Cardiovascular:     Rate and Rhythm: Normal rate and regular rhythm.     Pulses: Normal pulses.     Heart sounds: Normal heart sounds.  Pulmonary:     Effort: Pulmonary effort is normal. No respiratory distress.     Breath sounds: Normal breath sounds.  Abdominal:     General: Bowel sounds are normal. There is no distension.     Palpations: Abdomen is soft.     Tenderness: There is no abdominal tenderness.  Musculoskeletal:        General: Normal range of motion.     Cervical back: Neck supple.     Right lower leg: No edema.     Left lower leg: No edema.  Lymphadenopathy:     Cervical: No cervical adenopathy.  Skin:    General: Skin is warm and dry.  Neurological:     Mental Status: She is alert and oriented to person, place, and time.  Psychiatric:        Mood and Affect: Mood normal.       ASSESSMENT/ PLAN:  TODAY  1. Enteritis due to clostridium difficile 2. Morbid obesity 3. 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  Her end goal is to return back home.     MD is aware of resident's narcotic use and is in agreement with current plan of care. We will attempt to wean resident as appropriate.  Ok Edwards NP [facility]  Contact [phone] Monday through Friday 8am- 5pm  After hours call [phone]

## 2020-07-29 ENCOUNTER — Encounter: Payer: Self-pay | Admitting: Adult Health

## 2020-07-29 ENCOUNTER — Non-Acute Institutional Stay (SKILLED_NURSING_FACILITY): Payer: Medicare Other | Admitting: Adult Health

## 2020-07-29 DIAGNOSIS — J3089 Other allergic rhinitis: Secondary | ICD-10-CM

## 2020-07-29 DIAGNOSIS — K219 Gastro-esophageal reflux disease without esophagitis: Secondary | ICD-10-CM | POA: Diagnosis not present

## 2020-07-29 DIAGNOSIS — G4733 Obstructive sleep apnea (adult) (pediatric): Secondary | ICD-10-CM | POA: Diagnosis not present

## 2020-07-29 NOTE — Progress Notes (Signed)
Location:    [facility] Room Number: 153/P Place of Service:  SNF (31)   CODE STATUS: Full Code  No Known Allergies  Chief Complaint  Patient presents with   Short Term Rehab (STR)         Obstructive sleep apnea   Chronic non-seasonal allergic rhinitis:    Gastroesophageal reflux disease without esophagitis  Weekly follow up for the first 30 days post hospitalization.     HPI:  She is a 79 year old short term rehab patient being seen for the management of her chronic illnesses  Obstructive sleep apnea is stable will continue CPAP nightly   Chronic non-seasonal allergic rhinitis: is stable will continue flonase daily and zyrtec 10 mg daily  Gastroesophageal reflux disease without esophagitis no reports of uncontrolled pain; has fewer loose stools; has no heart burn. Is participating in therapy.   Past Medical History:  Diagnosis Date   Anemia in chronic renal disease 12/30/2015   Anxiety    Arthritis    "all over" (01/19/2014)   Arthritis, lumbar spine    Carpal tunnel syndrome    Chronic bronchitis ([place])    "got it q yr for awhile; hasn't had it in awhile" (01/19/2014)   Chronic kidney disease (CKD) stage G3b/A1, moderately decreased glomerular filtration rate (GFR) between 30-44 mL/min/1.73 square meter and albuminuria creatinine ratio less than 30 mg/g 09/14/2009   Qualifier: Diagnosis of  By: Moshe Cipro MD, Margaret     Complication of anesthesia    combative   COPD (chronic obstructive pulmonary disease) ([place])    Depression    Diverticulosis 07/2004   Colonscopy Dr Gala Romney   Esophagitis, erosive 2009   Exertional shortness of breath    Gastroesophageal reflux    Headache(784.0)    "usually a couple times/wk" (01/19/2014)   Heart murmur    saw cardiology In [place], he told her she did not need to come back.   Hyperlipidemia    Hypertension    Impingement syndrome, shoulder    Iron deficiency anemia    Mitral regurgitation     Myasthenia gravis    Myasthenia gravis in remission ([place]) 11/24/2014   Obesity    OSA on CPAP    Negative on last sleep study   Pneumonia 01/2012   Pulmonary HTN ([place])    Schatzki's ring    Last EGD w/ dilation 02/08/11, 2009 & 2007   Seasonal allergies    Shoulder pain    Thyroid disease    "used to take RX; they took me off it" (01/19/2014)   Tobacco abuse    Type II diabetes mellitus ([place])     Past Surgical History:  Procedure Laterality Date   25 GAUGE PARS PLANA VITRECTOMY WITH 20 GAUGE MVR PORT Left 01/19/2014   Procedure: 25 GAUGE PARS PLANA VITRECTOMY WITH 20 GAUGE MVR PORT; MEMBRAME PEEL; SERUM PATCH; LASER TREATMENT; C3F8;  Surgeon: Hayden Pedro, MD;  Location: [facility];  Service: Ophthalmology;  Laterality: Left;   ABDOMINAL HYSTERECTOMY     AGILE CAPSULE N/A 08/05/2019   Procedure: AGILE CAPSULE;  Surgeon: Daneil Dolin, MD;  [facility];  Service: Endoscopy;  Laterality: N/A;  7:30am   APPENDECTOMY     CARPAL TUNNEL RELEASE Bilateral    CATARACT EXTRACTION W/PHACO Left 10/21/2013   Procedure: LEFT CATARACT EXTRACTION PHACO AND INTRAOCULAR LENS PLACEMENT (IOC);  Surgeon: Marylynn Pearson, MD;  Location: [facility];  Service: Ophthalmology;  Laterality: Left;   CHOLECYSTECTOMY  COLONOSCOPY  05/08/2012   [id number] and external hemorrhoids; colonic diverticulosis   COLONOSCOPY WITH PROPOFOL N/A 02/26/2019   two simple adenomas, diverticulosis,  and internal hemorrhoids.    ESOPHAGEAL DILATION     "more than 3 times" (01/19/2014)   ESOPHAGOGASTRODUODENOSCOPY  02/08/11   [place]-Distal esophageal erosion consistent with mild erosive reflux   esophagitis/ Noncritical Schatzki ring, small hiatal hernia otherwise upper/ gastrointestinal tract appeared unremarkable, status post passage  of a Maloney dilation to biopsy disruption of the ring described   ESOPHAGOGASTRODUODENOSCOPY  04/2012   2 tandem incomplete distal esophagea rings s/p dilation.     ESOPHAGOGASTRODUODENOSCOPY N/A 09/16/2014   Dr. Gala Romney: Schatzki ring status post dilation/disruption.  Hiatal hernia.   ESOPHAGOGASTRODUODENOSCOPY (EGD) WITH PROPOFOL N/A 12/08/2018   EGD with mild Schatzki ring s/p dilation, small hiatal hernia, otherwise normal   EYE SURGERY     cataracts, bilateral   GIVENS CAPSULE STUDY N/A 09/29/2019   Procedure: GIVENS CAPSULE STUDY;  Surgeon: Daneil Dolin, MD;  [facility];  Service: Endoscopy;  Laterality: N/A;  7:30am   INCONTINENCE SURGERY  08/26/09   [place]   JOINT REPLACEMENT     right total knee   MALONEY DILATION N/A 09/16/2014   Procedure: Venia Minks DILATION;  Surgeon: Daneil Dolin, MD;  [facility];  Service: Endoscopy;  Laterality: N/A;   MALONEY DILATION N/A 12/08/2018   Procedure: Venia Minks DILATION;  Surgeon: Daneil Dolin, MD;  [facility];  Service: Endoscopy;  Laterality: N/A;   PARS PLANA VITRECTOMY W/ REPAIR OF MACULAR HOLE Left 01/19/2014   POLYPECTOMY  02/26/2019   Procedure: POLYPECTOMY;  Surgeon: Danie Binder, MD;  [facility];  Service: Endoscopy;;  ascending colon polyps x2   SAVORY DILATION N/A 09/16/2014   Procedure: SAVORY DILATION;  Surgeon: Daneil Dolin, MD;  [facility];  Service: Endoscopy;  Laterality: N/A;   TONSILLECTOMY     TOTAL KNEE ARTHROPLASTY Right 05/13/07   Dr. Aline Brochure   TRIGGER FINGER RELEASE Right 06/26/2018   Procedure: RIGHT INDEX FINGER TRIGGER FINGER/A-1 PULLEY RELEASE;  Surgeon: Carole Civil, MD;  Location: [facility];  Service: Orthopedics;  Laterality: Right;    Social History   Socioeconomic History   Marital status: Single    Spouse name: Not on file   Number of children: 2   Years of education: Not on file   Highest education level: Not on file  Occupational History   Occupation: disabled    Employer: RETIRED  Tobacco Use   Smoking status: Former Smoker    Packs/day: 0.50    Years: 25.00    Pack  years: 12.50    Types: Cigarettes    Quit date: 01/01/2004    Years since quitting: 16.5   Smokeless tobacco: Never Used  Scientific laboratory technician Use: Never used  Substance and Sexual Activity   Alcohol use: No   Drug use: No   Sexual activity: Never    Birth control/protection: Surgical  Other Topics Concern   Not on file  Social History Narrative   Patient lives at home by herself   Patient drinks one cup of coffee a day   Patient is right handed.   Social Determinants of Health   Financial Resource Strain:    Difficulty of Paying Living Expenses:   Food Insecurity:    Worried About Charity fundraiser in the Last Year:    Arboriculturist in  the Last Year:   Transportation Needs:    Film/video editor (Medical):    Lack of Transportation (Non-Medical):   Physical Activity:    Days of Exercise per Week:    Minutes of Exercise per Session:   Stress:    Feeling of Stress :   Social Connections:    Frequency of Communication with Friends and Family:    Frequency of Social Gatherings with Friends and Family:    Attends Religious Services:    Active Member of Clubs or Organizations:    Attends Music therapist:    Marital Status:   Intimate Partner Violence:    Fear of Current or Ex-Partner:    Emotionally Abused:    Physically Abused:    Sexually Abused:    Family History  Problem Relation Age of Onset   Cancer Mother    Heart disease Mother    Cancer Father    Heart disease Father    Diabetes Sister    Hypertension Brother    Heart disease Brother    Cancer Sister    Kidney failure Sister    Diabetes Son    Hypertension Son    Hypertension Daughter    Colon cancer Neg Hx       VITAL SIGNS BP (!) 118/62    Pulse 68    Temp 98 F (36.7 C) (Oral)    Ht '5\' 6"'$  (1.676 m)    Wt (!) 227 lb 9.6 oz (103.2 kg)    SpO2 95%    BMI 36.74 kg/m   Outpatient Encounter Medications as of 07/29/2020  Medication Sig    acetaminophen (TYLENOL) 325 MG tablet Take 2 tablets (650 mg total) by mouth every 6 (six) hours as needed for mild pain, fever or headache (or Fever >/= 101).   Amino Acids-Protein Hydrolys (FEEDING SUPPLEMENT, PRO-STAT SUGAR FREE 64,) LIQD Take 30 mLs by mouth 3 (three) times daily with meals.   Balsam [place]-Castor Oil (VENELEX) OINT Apply topically in the morning, at noon, and at bedtime. ointment; - ; amt: ribbon; topical   brimonidine-timolol (COMBIGAN) 0.2-0.5 % ophthalmic solution Place 1 drop into both eyes every 12 (twelve) hours.   cetirizine (ZYRTEC) 10 MG tablet TAKE 1 TABLET BY MOUTH ONCE A DAY.   Cholecalciferol (VITAMIN D3) 2000 units TABS Take 2,000 Units by mouth at bedtime.   cholestyramine (QUESTRAN) 4 GM/DOSE powder Take 4 g by mouth daily. For stool bulking   citalopram (CELEXA) 10 MG tablet TAKE 1 TABLET BY MOUTH ONCE A DAY.   Cranberry 1000 MG CAPS Take 1,000 mg by mouth daily.   cycloSPORINE (RESTASIS) 0.05 % ophthalmic emulsion Place 1 drop into both eyes 2 (two) times daily.    diltiazem (CARDIZEM CD) 120 MG 24 hr capsule Take 1 capsule (120 mg total) by mouth daily.   ezetimibe (ZETIA) 10 MG tablet TAKE 1 TABLET BY MOUTH ONCE A DAY.   famotidine (PEPCID) 40 MG tablet Take 40 mg by mouth every evening.   fluticasone (FLONASE) 50 MCG/ACT nasal spray PLACE 2 SPRAYS INTO BOTH NOSTRILS DAILY.   gabapentin (NEURONTIN) 300 MG capsule TAKE (1) CAPSULE BY MOUTH TWICE DAILY.   hydrocortisone (ANUSOL-HC) 2.5 % rectal cream Place 1 application rectally 2 (two) times daily.   insulin glargine (LANTUS SOLOSTAR) 100 UNIT/ML Solostar Pen Inject 6 Units into the skin at bedtime.    insulin lispro (HUMALOG KWIKPEN) 100 UNIT/ML KwikPen insulin pen; 100 unit/mL; amt: Per Sliding Scale; If Blood Sugar is  0 to 149, give 0 Units. If Blood Sugar is greater than 149, give 3 Units. subcutaneous Before Meals   linagliptin (TRADJENTA) 5 MG TABS tablet Take 1 tablet (5 mg total)  by mouth daily.   losartan (COZAAR) 25 MG tablet Take 1 tablet (25 mg total) by mouth daily.   Multiple Vitamin (MULTIVITAMIN WITH MINERALS) TABS tablet Take 1 tablet by mouth at bedtime.   NON FORMULARY Diet: _____ Regular, ______ NAS, _______Consistent Carbohydrate, _______NPO ___X__Other :SOFT DIET   nystatin (NYSTATIN) powder Apply 1 application topically daily as needed. Special Instructions: fungal rash to groin and inguinal folds   ondansetron (ZOFRAN) 4 MG tablet Take 1 tablet (4 mg total) by mouth every 8 (eight) hours as needed for nausea or vomiting.   predniSONE (DELTASONE) 5 MG tablet Take 1 tablet (5 mg total) by mouth daily.   PROAIR HFA 108 (90 Base) MCG/ACT inhaler INHALE 2 PUFFS BY MOUTH EVERY 6 HOURS AS NEEDED .   Probiotic Product (RISA-BID PROBIOTIC) TABS Take 1 tablet by mouth 2 (two) times daily.   pyridostigmine (MESTINON) 60 MG tablet TAKE (1) TABLET BY MOUTH THREE TIMES A DAY.   rosuvastatin (CRESTOR) 40 MG tablet TAKE (1) TABLET BY MOUTH AT BEDTIME.   solifenacin (VESICARE) 10 MG tablet TAKE 1 TABLET BY MOUTH ONCE A DAY.   venlafaxine XR (EFFEXOR-XR) 150 MG 24 hr capsule TAKE 1 CAPSULE BY MOUTH DAILY.   [DISCONTINUED] HYDROcodone-acetaminophen (NORCO) 10-325 MG tablet Take 1 tablet by mouth 2 (two) times daily as needed. For chronic pain management   No facility-administered encounter medications on file as of 07/29/2020.     SIGNIFICANT DIAGNOSTIC EXAMS   PREVIOUS   06-12-20: ct of abdomen and pelvis 1. Diffuse circumferential wall thickening of much of the descending colon and sigmoid colon with multiple enlarged adjacent regional lymph nodes. Findings are concerning for an infectious or inflammatory colitis. a follow-up colonoscopy is recommended as an outpatient. 2. Complex cystic mass in the right hemipelvis likely associated with the right ovary measuring up to 3.9 cm. Follow-up with a nonemergent outpatient pelvic ultrasound is  recommended. 3. Large hiatal hernia. 4. Nonobstructive right nephrolithiasis. Aortic Atherosclerosis  NO NEW EXAMS.   LABS REVIEWED PREVIOUS  06-12-20: wbc 17.7; hgb 9.1; hct 29.0; mcv 84.8 plt 560; glucose 250; bun 35; creat 1.68; k+ 3.2; na++ 135; ca 8.2 liver normal albumin 2.3 mag 1.5 phos 3.0 hgb a1c 6.9 06-14-20: wbc 13.9; hgb 8.0; hct 25.1; mcv 84.2 plt 415; glucose 99; bun 22; creat 1.21; k+ 3.2; na++ 135; ca 7.3 06-15-20: + c-diff 06-17-20: wbc 14.8; hgb 8.4; hct 25.7; mcv 83.2 plt 355; glucose 82; bun 16; creat 1.22; k+ 4.5; na++ 135; ca 7.8 phos 2.4 albumin 1.8 mag 1.6 06-20-20: wbc 14.5; hgb 7.5; hct 22.9; mcv 81.2 plt 410; glucose 80; bun 16; creat 1.18; k+ 3.9; na++ 134; ca 7.7 liver normal albumin 1.5 mag 1.8  07-03-20: wbc 109; hgb 6.3; hct 20.5; mcv 90.7 plt 491; glucose 105; bun 24; creat 0.78; k+ 3.2; na++ 137; ca 7.8 ast 54; alt 70; alk phos 138; albumin 1.7 07-04-20: hgb 8.9; hct 29.4 07-05-20: wbc 10.7; hgb 8.7; hct 28.7; mcv 92.6 plt 400; glucose 59; bun 17; creat 0.61; k+ 3.9; na++ 140; ca 7.8 mag 1.7  07-06-20: wbc 9.2; hgb 9.2; hct 31.1; mcv 93.7 plt 399  07-11-20: wbc 10.6; hgb 8.9; hct 28.9; mcv 90.9 plt 371 07-13-20: c-diff: negative   NO NEW LABS.   Review of Systems  Constitutional: Negative for malaise/fatigue.  Respiratory: Negative for cough and shortness of breath.   Cardiovascular: Negative for chest pain, palpitations and leg swelling.  Gastrointestinal: Negative for abdominal pain, constipation and heartburn.  Musculoskeletal: Negative for back pain, joint pain and myalgias.  Skin: Negative.   Neurological: Negative for dizziness.  Psychiatric/Behavioral: The patient is not nervous/anxious.     Physical Exam Constitutional:      General: She is not in acute distress.    Appearance: She is well-developed. She is morbidly obese. She is not diaphoretic.  Neck:     Thyroid: No thyromegaly.  Cardiovascular:     Rate and Rhythm: Normal rate and regular  rhythm.     Pulses: Normal pulses.     Heart sounds: Normal heart sounds.  Pulmonary:     Effort: Pulmonary effort is normal. No respiratory distress.     Breath sounds: Normal breath sounds.  Abdominal:     General: Bowel sounds are normal. There is no distension.     Palpations: Abdomen is soft.     Tenderness: There is no abdominal tenderness.  Musculoskeletal:        General: Normal range of motion.     Cervical back: Neck supple.     Right lower leg: No edema.     Left lower leg: No edema.  Lymphadenopathy:     Cervical: No cervical adenopathy.  Skin:    General: Skin is warm and dry.  Neurological:     Mental Status: She is alert and oriented to person, place, and time.  Psychiatric:        Mood and Affect: Mood normal.       ASSESSMENT/ PLAN:  TODAY  1. Obstructive sleep apnea is stable will continue CPAP nightly   2. Chronic non-seasonal allergic rhinitis: is stable will continue flonase daily and zyrtec 10 mg daily   3. Gastroesophageal reflux disease without esophagitis: is stable will continue pepcid 40 mg daily   PREVIOUS   4. Type 2 diabetes mellitus with diabetic neuropathy with long term current use of insulin: is stable hgb a1c 6.9 will continue lantus 6 units nightly and humalog 3 units with meals  tradjenta 5 mg daily   5. Hyperlipidemia associated with type 2 diabetes mellitus is stable will continue crestor 40 mg daily zetia 10 mg daily   6. Myasthenia gravis in remission is stable will continue mestinon 60 mg three times daily prednisone 5 mg daily   7. Depression with anxiety is stable will continue effexor xr 150 mg daily celexa 10 mg daily   8. Urinary urgency is stable will continue vesicare 10 mg daily   9. Pelvic mass in female: u/s done awaiting MRI   10. Acute blood anemia: stable hgb 9.2; will continue to monitor her status.   11. Chronic obstructive pulmonary disease unspecified COPD type: is stable will continue albuterol 2 puffs  every 6 hours as needed  12. Increased intraocular pressure bilateral is stable will continue combigan to both eyes twice daily   13. Protein calorie malnutrition severe: albumin 1.5 will continue prostat 30 ml three times daily    14. C.difficile colitis: antigen negative will continue questran 4 gm daily for bulking and will monitor   15. Diabetic peripheral neuropathy associated with type 2 diabetes mellitus is stable will continue gabapentin 300 mg twice daily has been on long term   16. Hypertension associated with stage 3 chronic kidney disease due to type 2 diabetes mellitus: is stable  b/p 118/62 will continue cozaar 25 mg daily cardizem cd 120 mg daily            MD is aware of resident's narcotic use and is in agreement with current plan of care. We will attempt to wean resident as appropriate.  Ok Edwards NP [facility]  Contact [phone] Monday through Friday 8am- 5pm  After hours call [phone]

## 2020-07-31 ENCOUNTER — Encounter (HOSPITAL_COMMUNITY)
Admission: RE | Admit: 2020-07-31 | Discharge: 2020-07-31 | Disposition: A | Discharge status: Home / Self Care | Payer: Medicare Other | Source: Skilled Nursing Facility | Attending: Adult Health | Admitting: Adult Health

## 2020-07-31 DIAGNOSIS — K922 Gastrointestinal hemorrhage, unspecified: Secondary | ICD-10-CM | POA: Insufficient documentation

## 2020-07-31 DIAGNOSIS — N183 Chronic kidney disease, stage 3 unspecified: Secondary | ICD-10-CM | POA: Insufficient documentation

## 2020-08-05 ENCOUNTER — Telehealth: Payer: Self-pay | Admitting: *Deleted

## 2020-08-05 NOTE — Telephone Encounter (Signed)
Patient needs TCS with propofol, Dr. Gala Romney, ASA 3. Patient at [facility] x 2 and line rang numerous times, no answer

## 2020-08-07 LAB — C DIFFICILE QUICK SCREEN W PCR REFLEX
C Diff antigen: NEGATIVE
C Diff interpretation: NOT DETECTED
C Diff toxin: NEGATIVE

## 2020-08-08 ENCOUNTER — Non-Acute Institutional Stay (SKILLED_NURSING_FACILITY): Payer: Medicare Other | Admitting: Adult Health

## 2020-08-08 ENCOUNTER — Encounter: Payer: Self-pay | Admitting: Adult Health

## 2020-08-08 ENCOUNTER — Other Ambulatory Visit: Payer: Self-pay | Admitting: Adult Health

## 2020-08-08 DIAGNOSIS — I129 Hypertensive chronic kidney disease with stage 1 through stage 4 chronic kidney disease, or unspecified chronic kidney disease: Secondary | ICD-10-CM

## 2020-08-08 DIAGNOSIS — J0100 Acute maxillary sinusitis, unspecified: Secondary | ICD-10-CM

## 2020-08-08 DIAGNOSIS — A0472 Enterocolitis due to Clostridium difficile, not specified as recurrent: Secondary | ICD-10-CM

## 2020-08-08 DIAGNOSIS — E1140 Type 2 diabetes mellitus with diabetic neuropathy, unspecified: Secondary | ICD-10-CM | POA: Diagnosis not present

## 2020-08-08 DIAGNOSIS — Z794 Long term (current) use of insulin: Secondary | ICD-10-CM

## 2020-08-08 DIAGNOSIS — E1122 Type 2 diabetes mellitus with diabetic chronic kidney disease: Secondary | ICD-10-CM | POA: Diagnosis not present

## 2020-08-08 DIAGNOSIS — N183 Chronic kidney disease, stage 3 unspecified: Secondary | ICD-10-CM

## 2020-08-08 MED ORDER — PYRIDOSTIGMINE BROMIDE 60 MG PO TABS: ORAL_TABLET | ORAL | 0 refills | Status: AC

## 2020-08-08 MED ORDER — PREDNISONE 5 MG PO TABS: 5.0000 mg | ORAL_TABLET | Freq: Every day | ORAL | 0 refills | Status: DC

## 2020-08-08 MED ORDER — LANTUS SOLOSTAR 100 UNIT/ML ~~LOC~~ SOPN: 6.0000 [IU] | PEN_INJECTOR | Freq: Every day | SUBCUTANEOUS | 0 refills | Status: DC

## 2020-08-08 MED ORDER — FLUTICASONE PROPIONATE 50 MCG/ACT NA SUSP: 2.0000 | Freq: Every day | NASAL | 0 refills | Status: AC

## 2020-08-08 MED ORDER — VENLAFAXINE HCL ER 150 MG PO CP24: 150.0000 mg | ORAL_CAPSULE | Freq: Every day | ORAL | 0 refills | Status: AC

## 2020-08-08 MED ORDER — FAMOTIDINE 40 MG PO TABS: 40.0000 mg | ORAL_TABLET | Freq: Every evening | ORAL | 0 refills | Status: DC

## 2020-08-08 MED ORDER — CITALOPRAM HYDROBROMIDE 10 MG PO TABS: 10.0000 mg | ORAL_TABLET | Freq: Every day | ORAL | 0 refills | Status: AC

## 2020-08-08 MED ORDER — COMBIGAN 0.2-0.5 % OP SOLN: 1.0000 [drp] | Freq: Two times a day (BID) | OPHTHALMIC | 0 refills | Status: AC

## 2020-08-08 MED ORDER — EZETIMIBE 10 MG PO TABS: 10.0000 mg | ORAL_TABLET | Freq: Every day | ORAL | 0 refills | Status: AC

## 2020-08-08 MED ORDER — GABAPENTIN 300 MG PO CAPS: ORAL_CAPSULE | ORAL | 0 refills | Status: AC

## 2020-08-08 MED ORDER — SOLIFENACIN SUCCINATE 10 MG PO TABS: 10.0000 mg | ORAL_TABLET | Freq: Every day | ORAL | 0 refills | Status: AC

## 2020-08-08 MED ORDER — ALBUTEROL SULFATE HFA 108 (90 BASE) MCG/ACT IN AERS: 2.0000 | INHALATION_SPRAY | Freq: Four times a day (QID) | RESPIRATORY_TRACT | 0 refills | Status: AC | PRN

## 2020-08-08 MED ORDER — CYCLOSPORINE 0.05 % OP EMUL: 1.0000 [drp] | Freq: Two times a day (BID) | OPHTHALMIC | 0 refills | Status: AC

## 2020-08-08 MED ORDER — LINAGLIPTIN 5 MG PO TABS: 5.0000 mg | ORAL_TABLET | Freq: Every day | ORAL | 0 refills | Status: DC

## 2020-08-08 MED ORDER — INSULIN LISPRO (1 UNIT DIAL) 100 UNIT/ML (KWIKPEN): 3.0000 [IU] | PEN_INJECTOR | Freq: Three times a day (TID) | SUBCUTANEOUS | 0 refills | Status: AC

## 2020-08-08 MED ORDER — LOSARTAN POTASSIUM 25 MG PO TABS: 25.0000 mg | ORAL_TABLET | Freq: Every day | ORAL | 0 refills | Status: DC

## 2020-08-08 MED ORDER — DILTIAZEM HCL ER COATED BEADS 120 MG PO CP24: 120.0000 mg | ORAL_CAPSULE | Freq: Every day | ORAL | 0 refills | Status: DC

## 2020-08-08 MED ORDER — ROSUVASTATIN CALCIUM 40 MG PO TABS: ORAL_TABLET | ORAL | 0 refills | Status: DC

## 2020-08-08 MED ORDER — CHOLESTYRAMINE 4 GM/DOSE PO POWD: 4.0000 g | Freq: Two times a day (BID) | ORAL | 0 refills | Status: DC

## 2020-08-08 NOTE — Progress Notes (Signed)
Location:    [facility] Room Number: 153/P Place of Service:  SNF (31)    CODE STATUS: Full Code  No Known Allergies  Chief Complaint  Patient presents with  . Discharge Note    Discharge Visit    HPI:  She is being discharged to home with home health for pt/ot/rn. She will need a standard wheelchair; shower chair and bedside commode. She will need her prescriptions written and will need to follow up with her medical provider. She had been hospitalized for c-dif colitis. She was admitted to this facility for short term rehab. She has participated in therapy. She continues to have diarrheal stools; and will need to continue to follow up with GI for further interventions. Her c-diff came back negative for antigens twice. She is now ready to discharge to home and complete therapy on a home health basis     Past Medical History:  Diagnosis Date  . Anemia in chronic renal disease 12/30/2015  . Anxiety   . Arthritis    "all over" (01/19/2014)  . Arthritis, lumbar spine   . Carpal tunnel syndrome   . Chronic bronchitis ([place])    "got it q yr for awhile; hasn't had it in awhile" (01/19/2014)  . Chronic kidney disease (CKD) stage G3b/A1, moderately decreased glomerular filtration rate (GFR) between 30-44 mL/min/1.73 square meter and albuminuria creatinine ratio less than 30 mg/g 09/14/2009   Qualifier: Diagnosis of  By: Moshe Cipro MD, Joycelyn Schmid    . Complication of anesthesia    combative  . COPD (chronic obstructive pulmonary disease) ([place])   . Depression   . Diverticulosis 07/2004   Colonscopy Dr Gala Romney  . Esophagitis, erosive 2009  . Exertional shortness of breath   . Gastroesophageal reflux   . WSFKCLEX(517.0)    "usually a couple times/wk" (01/19/2014)  . Heart murmur    saw cardiology In [place], he told her she did not need to come back.  . Hyperlipidemia   . Hypertension   . Impingement syndrome, shoulder   . Iron deficiency anemia   . Mitral  regurgitation   . Myasthenia gravis   . Myasthenia gravis in remission ([place]) 11/24/2014  . Obesity   . OSA on CPAP    Negative on last sleep study  . Pneumonia 01/2012  . Pulmonary HTN ([place])   . Schatzki's ring    Last EGD w/ dilation 02/08/11, 2009 & 2007  . Seasonal allergies   . Shoulder pain   . Thyroid disease    "used to take RX; they took me off it" (01/19/2014)  . Tobacco abuse   . Type II diabetes mellitus ([place])     Past Surgical History:  Procedure Laterality Date  . [place] VITRECTOMY WITH 20 GAUGE MVR PORT Left 01/19/2014   Procedure: 25 GAUGE PARS PLANA VITRECTOMY WITH 20 GAUGE MVR PORT; MEMBRAME PEEL; SERUM PATCH; LASER TREATMENT; C3F8;  Surgeon: Hayden Pedro, MD;  Location: [facility];  Service: Ophthalmology;  Laterality: Left;  . ABDOMINAL HYSTERECTOMY    . AGILE CAPSULE N/A 08/05/2019   Procedure: AGILE CAPSULE;  Surgeon: Daneil Dolin, MD;  [facility];  Service: Endoscopy;  Laterality: N/A;  7:30am  . APPENDECTOMY    . CARPAL TUNNEL RELEASE Bilateral   . CATARACT EXTRACTION W/PHACO Left 10/21/2013   Procedure: LEFT CATARACT EXTRACTION PHACO AND INTRAOCULAR LENS PLACEMENT (IOC);  Surgeon: Marylynn Pearson, MD;  Location: [facility];  Service: Ophthalmology;  Laterality: Left;  .  CHOLECYSTECTOMY    . COLONOSCOPY  05/08/2012   AXE:NMMHWKGS and external hemorrhoids; colonic diverticulosis  . COLONOSCOPY WITH PROPOFOL N/A 02/26/2019   two simple adenomas, diverticulosis,  and internal hemorrhoids.   . ESOPHAGEAL DILATION     "more than 3 times" (01/19/2014)  . ESOPHAGOGASTRODUODENOSCOPY  02/08/11   [place]-Distal esophageal erosion consistent with mild erosive reflux   esophagitis/ Noncritical Schatzki ring, small hiatal hernia otherwise upper/ gastrointestinal tract appeared unremarkable, status post passage  of a Maloney dilation to biopsy disruption of the ring described  . ESOPHAGOGASTRODUODENOSCOPY  04/2012   2 tandem incomplete distal esophagea rings s/p  dilation.   . ESOPHAGOGASTRODUODENOSCOPY N/A 09/16/2014   Dr. Gala Romney: Schatzki ring status post dilation/disruption.  Hiatal hernia.  Marland Kitchen ESOPHAGOGASTRODUODENOSCOPY (EGD) WITH PROPOFOL N/A 12/08/2018   EGD with mild Schatzki ring s/p dilation, small hiatal hernia, otherwise normal  . EYE SURGERY     cataracts, bilateral  . GIVENS CAPSULE STUDY N/A 09/29/2019   Procedure: GIVENS CAPSULE STUDY;  Surgeon: Daneil Dolin, MD;  [facility];  Service: Endoscopy;  Laterality: N/A;  7:30am  . INCONTINENCE SURGERY  08/26/09   [place]  . JOINT REPLACEMENT     right total knee  . MALONEY DILATION N/A 09/16/2014   Procedure: Venia Minks DILATION;  Surgeon: Daneil Dolin, MD;  [facility];  Service: Endoscopy;  Laterality: N/A;  . Venia Minks DILATION N/A 12/08/2018   Procedure: Venia Minks DILATION;  Surgeon: Daneil Dolin, MD;  [facility];  Service: Endoscopy;  Laterality: N/A;  . PARS PLANA VITRECTOMY W/ REPAIR OF MACULAR HOLE Left 01/19/2014  . POLYPECTOMY  02/26/2019   Procedure: POLYPECTOMY;  Surgeon: Danie Binder, MD;  [facility];  Service: Endoscopy;;  ascending colon polyps x2  . SAVORY DILATION N/A 09/16/2014   Procedure: SAVORY DILATION;  Surgeon: Daneil Dolin, MD;  [facility];  Service: Endoscopy;  Laterality: N/A;  . TONSILLECTOMY    . TOTAL KNEE ARTHROPLASTY Right 05/13/07   Dr. Aline Brochure  . TRIGGER FINGER RELEASE Right 06/26/2018   Procedure: RIGHT INDEX FINGER TRIGGER FINGER/A-1 PULLEY RELEASE;  Surgeon: Carole Civil, MD;  Location: [facility];  Service: Orthopedics;  Laterality: Right;    Social History   Socioeconomic History  . Marital status: Single    Spouse name: Not on file  . Number of children: 2  . Years of education: Not on file  . Highest education level: Not on file  Occupational History  . Occupation: disabled    Employer: RETIRED  Tobacco Use  . Smoking status: Former Smoker    Packs/day: 0.50    Years:  25.00    Pack years: 12.50    Types: Cigarettes    Quit date: 01/01/2004    Years since quitting: 16.6  . Smokeless tobacco: Never Used  Vaping Use  . Vaping Use: Never used  Substance and Sexual Activity  . Alcohol use: No  . Drug use: No  . Sexual activity: Never    Birth control/protection: Surgical  Other Topics Concern  . Not on file  Social History Narrative   Patient lives at home by herself   Patient drinks one cup of coffee a day   Patient is right handed.   Social Determinants of Health   Financial Resource Strain:   . Difficulty of Paying Living Expenses:   Food Insecurity:   . Worried About Charity fundraiser in the Last Year:   .  Ran Out of Food in the Last Year:   Transportation Needs:   . Film/video editor (Medical):   Marland Kitchen Lack of Transportation (Non-Medical):   Physical Activity:   . Days of Exercise per Week:   . Minutes of Exercise per Session:   Stress:   . Feeling of Stress :   Social Connections:   . Frequency of Communication with Friends and Family:   . Frequency of Social Gatherings with Friends and Family:   . Attends Religious Services:   . Active Member of Clubs or Organizations:   . Attends Archivist Meetings:   Marland Kitchen Marital Status:   Intimate Partner Violence:   . Fear of Current or Ex-Partner:   . Emotionally Abused:   Marland Kitchen Physically Abused:   . Sexually Abused:    Family History  Problem Relation Age of Onset  . Cancer Mother   . Heart disease Mother   . Cancer Father   . Heart disease Father   . Diabetes Sister   . Hypertension Brother   . Heart disease Brother   . Cancer Sister   . Kidney failure Sister   . Diabetes Son   . Hypertension Son   . Hypertension Daughter   . Colon cancer Neg Hx     VITAL SIGNS BP 114/68   Pulse 72   Temp 98.6 F (37 C) (Oral)   Ht 5' 6" (1.676 m)   Wt 231 lb 12.8 oz (105.1 kg)   SpO2 95%   BMI 37.41 kg/m   Patient's Medications  New Prescriptions   No medications on file   Previous Medications   ACETAMINOPHEN (TYLENOL) 325 MG TABLET    Take 2 tablets (650 mg total) by mouth every 6 (six) hours as needed for mild pain, fever or headache (or Fever >/= 101).   BRIMONIDINE-TIMOLOL (COMBIGAN) 0.2-0.5 % OPHTHALMIC SOLUTION    Place 1 drop into both eyes every 12 (twelve) hours.   CETIRIZINE (ZYRTEC) 10 MG TABLET    TAKE 1 TABLET BY MOUTH ONCE A DAY.   CHOLECALCIFEROL (VITAMIN D3) 2000 UNITS TABS    Take 2,000 Units by mouth at bedtime.   CHOLESTYRAMINE (QUESTRAN) 4 GM/DOSE POWDER    Take 4 g by mouth daily. For stool bulking   CITALOPRAM (CELEXA) 10 MG TABLET    TAKE 1 TABLET BY MOUTH ONCE A DAY.   CRANBERRY 1000 MG CAPS    Take 1,000 mg by mouth daily.   CYCLOSPORINE (RESTASIS) 0.05 % OPHTHALMIC EMULSION    Place 1 drop into both eyes 2 (two) times daily.    DILTIAZEM (CARDIZEM CD) 120 MG 24 HR CAPSULE    Take 1 capsule (120 mg total) by mouth daily.   EZETIMIBE (ZETIA) 10 MG TABLET    TAKE 1 TABLET BY MOUTH ONCE A DAY.   FAMOTIDINE (PEPCID) 40 MG TABLET    Take 40 mg by mouth every evening.   FLUTICASONE (FLONASE) 50 MCG/ACT NASAL SPRAY    PLACE 2 SPRAYS INTO BOTH NOSTRILS DAILY.   GABAPENTIN (NEURONTIN) 300 MG CAPSULE    TAKE (1) CAPSULE BY MOUTH TWICE DAILY.   HYDROCORTISONE (ANUSOL-HC) 2.5 % RECTAL CREAM    Place 1 application rectally 2 (two) times daily.   INSULIN GLARGINE (LANTUS SOLOSTAR) 100 UNIT/ML SOLOSTAR PEN    Inject 6 Units into the skin at bedtime.    INSULIN LISPRO (HUMALOG KWIKPEN) 100 UNIT/ML KWIKPEN    insulin pen; 100 unit/mL; amt: Per Sliding Scale; If Blood  Sugar is 0 to 149, give 0 Units. If Blood Sugar is greater than 149, give 3 Units. subcutaneous Before Meals   LINAGLIPTIN (TRADJENTA) 5 MG TABS TABLET    Take 1 tablet (5 mg total) by mouth daily.   LOSARTAN (COZAAR) 25 MG TABLET    Take 1 tablet (25 mg total) by mouth daily.   MULTIPLE VITAMIN (MULTIVITAMIN WITH MINERALS) TABS TABLET    Take 1 tablet by mouth at bedtime.   NON FORMULARY     Diet: _____ Regular, ______ NAS, _______Consistent Carbohydrate, _______NPO ___X__Other :SOFT DIET   NYSTATIN (NYSTATIN) POWDER    Apply 1 application topically daily as needed. Special Instructions: fungal rash to groin and inguinal folds   ONDANSETRON (ZOFRAN) 4 MG TABLET    Take 1 tablet (4 mg total) by mouth every 8 (eight) hours as needed for nausea or vomiting.   PREDNISONE (DELTASONE) 5 MG TABLET    Take 1 tablet (5 mg total) by mouth daily.   PROAIR HFA 108 (90 BASE) MCG/ACT INHALER    INHALE 2 PUFFS BY MOUTH EVERY 6 HOURS AS NEEDED .   PROBIOTIC PRODUCT (RISA-BID PROBIOTIC) TABS    Take 1 tablet by mouth 2 (two) times daily.   PYRIDOSTIGMINE (MESTINON) 60 MG TABLET    TAKE (1) TABLET BY MOUTH THREE TIMES A DAY.   ROSUVASTATIN (CRESTOR) 40 MG TABLET    TAKE (1) TABLET BY MOUTH AT BEDTIME.   SOLIFENACIN (VESICARE) 10 MG TABLET    TAKE 1 TABLET BY MOUTH ONCE A DAY.   VENLAFAXINE XR (EFFEXOR-XR) 150 MG 24 HR CAPSULE    TAKE 1 CAPSULE BY MOUTH DAILY.  Modified Medications   No medications on file  Discontinued Medications   AMINO ACIDS-PROTEIN HYDROLYS (FEEDING SUPPLEMENT, PRO-STAT SUGAR FREE 64,) LIQD    Take 30 mLs by mouth 3 (three) times daily with meals.   BALSAM PERU-CASTOR OIL (VENELEX) OINT    Apply topically in the morning, at noon, and at bedtime. ointment; - ; amt: ribbon; topical     SIGNIFICANT DIAGNOSTIC EXAMS   PREVIOUS   06-12-20: ct of abdomen and pelvis 1. Diffuse circumferential wall thickening of much of the descending colon and sigmoid colon with multiple enlarged adjacent regional lymph nodes. Findings are concerning for an infectious or inflammatory colitis. a follow-up colonoscopy is recommended as an outpatient. 2. Complex cystic mass in the right hemipelvis likely associated with the right ovary measuring up to 3.9 cm. Follow-up with a nonemergent outpatient pelvic ultrasound is recommended. 3. Large hiatal hernia. 4. Nonobstructive right  nephrolithiasis. Aortic Atherosclerosis  NO NEW EXAMS.   LABS REVIEWED PREVIOUS  06-12-20: wbc 17.7; hgb 9.1; hct 29.0; mcv 84.8 plt 560; glucose 250; bun 35; creat 1.68; k+ 3.2; na++ 135; ca 8.2 liver normal albumin 2.3 mag 1.5 phos 3.0 hgb a1c 6.9 06-14-20: wbc 13.9; hgb 8.0; hct 25.1; mcv 84.2 plt 415; glucose 99; bun 22; creat 1.21; k+ 3.2; na++ 135; ca 7.3 06-15-20: + c-diff 06-17-20: wbc 14.8; hgb 8.4; hct 25.7; mcv 83.2 plt 355; glucose 82; bun 16; creat 1.22; k+ 4.5; na++ 135; ca 7.8 phos 2.4 albumin 1.8 mag 1.6 06-20-20: wbc 14.5; hgb 7.5; hct 22.9; mcv 81.2 plt 410; glucose 80; bun 16; creat 1.18; k+ 3.9; na++ 134; ca 7.7 liver normal albumin 1.5 mag 1.8  07-03-20: wbc 109; hgb 6.3; hct 20.5; mcv 90.7 plt 491; glucose 105; bun 24; creat 0.78; k+ 3.2; na++ 137; ca 7.8 ast 54; alt 70; alk  phos 138; albumin 1.7 07-04-20: hgb 8.9; hct 29.4 07-05-20: wbc 10.7; hgb 8.7; hct 28.7; mcv 92.6 plt 400; glucose 59; bun 17; creat 0.61; k+ 3.9; na++ 140; ca 7.8 mag 1.7  07-06-20: wbc 9.2; hgb 9.2; hct 31.1; mcv 93.7 plt 399  07-11-20: wbc 10.6; hgb 8.9; hct 28.9; mcv 90.9 plt 371 07-13-20: c-diff: negative   NO NEW LABS.   Review of Systems  Constitutional: Negative for malaise/fatigue.  Respiratory: Negative for cough and shortness of breath.   Cardiovascular: Negative for chest pain, palpitations and leg swelling.  Gastrointestinal: Positive for diarrhea. Negative for abdominal pain and heartburn.  Musculoskeletal: Negative for back pain, joint pain and myalgias.  Skin: Negative.   Neurological: Negative for dizziness.  Psychiatric/Behavioral: The patient is not nervous/anxious.     Physical Exam Constitutional:      General: She is not in acute distress.    Appearance: She is well-developed. She is morbidly obese. She is not diaphoretic.  Neck:     Thyroid: No thyromegaly.  Cardiovascular:     Rate and Rhythm: Normal rate and regular rhythm.     Pulses: Normal pulses.     Heart sounds:  Normal heart sounds.  Pulmonary:     Effort: Pulmonary effort is normal. No respiratory distress.     Breath sounds: Normal breath sounds.  Abdominal:     General: Bowel sounds are normal. There is no distension.     Palpations: Abdomen is soft.     Tenderness: There is no abdominal tenderness.  Musculoskeletal:        General: Normal range of motion.     Cervical back: Neck supple.     Right lower leg: No edema.     Left lower leg: No edema.  Lymphadenopathy:     Cervical: No cervical adenopathy.  Skin:    General: Skin is warm and dry.  Neurological:     Mental Status: She is alert and oriented to person, place, and time.  Psychiatric:        Mood and Affect: Mood normal.      ASSESSMENT/ PLAN:   Patient is being discharged with the following home health services:  Pt/ot/rn: to evaluate and treat as indicated for gait balance strength adl training. Medication management.   Patient is being discharged with the following durable medical equipment:  Bed side commode; shower chair. Standard wheelchair with cushion; anti-tippers; elevated leg rests; brake extensions. To allow her to maintain her current level of independence with her adls which cannot be achieved with a walker cane crutches; can self propel wheelchair   Patient has been advised to f/u with their PCP in 1-2 weeks to bring them up to date on their rehab stay.  Social services at facility was responsible for arranging this appointment.  Pt was provided with a 30 day supply of prescriptions for medications and refills must be obtained from their PCP.  For controlled substances, a more limited supply may be provided adequate until PCP appointment only.   A 30 day supply of her current prescription medications have been sent to [facility].   Time spent with patient 45 minutes: dme; medications home health.     Ok Edwards NP [facility]  Contact [phone] Monday through Friday 8am- 5pm  After hours  call [phone]

## 2020-08-09 ENCOUNTER — Ambulatory Visit: Payer: Medicare Other | Admitting: Family Medicine

## 2020-08-09 ENCOUNTER — Other Ambulatory Visit: Payer: Self-pay | Admitting: Family Medicine

## 2020-08-09 DIAGNOSIS — G7000 Myasthenia gravis without (acute) exacerbation: Secondary | ICD-10-CM | POA: Diagnosis not present

## 2020-08-09 DIAGNOSIS — I129 Hypertensive chronic kidney disease with stage 1 through stage 4 chronic kidney disease, or unspecified chronic kidney disease: Secondary | ICD-10-CM | POA: Diagnosis not present

## 2020-08-09 DIAGNOSIS — Z96651 Presence of right artificial knee joint: Secondary | ICD-10-CM | POA: Diagnosis not present

## 2020-08-09 DIAGNOSIS — M170 Bilateral primary osteoarthritis of knee: Secondary | ICD-10-CM | POA: Diagnosis not present

## 2020-08-11 ENCOUNTER — Ambulatory Visit (INDEPENDENT_AMBULATORY_CARE_PROVIDER_SITE_OTHER): Payer: Medicare Other | Admitting: Family Medicine

## 2020-08-11 ENCOUNTER — Encounter: Payer: Self-pay | Admitting: Family Medicine

## 2020-08-11 ENCOUNTER — Other Ambulatory Visit (HOSPITAL_COMMUNITY)
Admission: RE | Admit: 2020-08-11 | Discharge: 2020-08-11 | Disposition: A | Discharge status: Home / Self Care | Payer: Medicare Other | Source: Ambulatory Visit | Attending: Family Medicine | Admitting: Family Medicine

## 2020-08-11 ENCOUNTER — Other Ambulatory Visit: Payer: Self-pay

## 2020-08-11 VITALS — BP 146/70 | HR 74 | Resp 16 | Ht 66.0 in | Wt 228.1 lb

## 2020-08-11 DIAGNOSIS — J323 Chronic sphenoidal sinusitis: Secondary | ICD-10-CM | POA: Diagnosis not present

## 2020-08-11 DIAGNOSIS — G7000 Myasthenia gravis without (acute) exacerbation: Secondary | ICD-10-CM | POA: Diagnosis not present

## 2020-08-11 DIAGNOSIS — Z743 Need for continuous supervision: Secondary | ICD-10-CM | POA: Diagnosis not present

## 2020-08-11 DIAGNOSIS — K56601 Complete intestinal obstruction, unspecified as to cause: Secondary | ICD-10-CM | POA: Diagnosis not present

## 2020-08-11 DIAGNOSIS — D509 Iron deficiency anemia, unspecified: Secondary | ICD-10-CM | POA: Diagnosis not present

## 2020-08-11 DIAGNOSIS — R1900 Intra-abdominal and pelvic swelling, mass and lump, unspecified site: Secondary | ICD-10-CM | POA: Diagnosis not present

## 2020-08-11 DIAGNOSIS — E669 Obesity, unspecified: Secondary | ICD-10-CM | POA: Diagnosis not present

## 2020-08-11 DIAGNOSIS — R531 Weakness: Secondary | ICD-10-CM | POA: Diagnosis not present

## 2020-08-11 DIAGNOSIS — E1122 Type 2 diabetes mellitus with diabetic chronic kidney disease: Secondary | ICD-10-CM

## 2020-08-11 DIAGNOSIS — K5732 Diverticulitis of large intestine without perforation or abscess without bleeding: Secondary | ICD-10-CM | POA: Diagnosis not present

## 2020-08-11 DIAGNOSIS — D62 Acute posthemorrhagic anemia: Secondary | ICD-10-CM | POA: Diagnosis not present

## 2020-08-11 DIAGNOSIS — E785 Hyperlipidemia, unspecified: Secondary | ICD-10-CM | POA: Diagnosis not present

## 2020-08-11 DIAGNOSIS — A0472 Enterocolitis due to Clostridium difficile, not specified as recurrent: Secondary | ICD-10-CM

## 2020-08-11 DIAGNOSIS — E032 Hypothyroidism due to medicaments and other exogenous substances: Secondary | ICD-10-CM

## 2020-08-11 DIAGNOSIS — I700 Atherosclerosis of aorta: Secondary | ICD-10-CM | POA: Diagnosis not present

## 2020-08-11 DIAGNOSIS — M159 Polyosteoarthritis, unspecified: Secondary | ICD-10-CM | POA: Diagnosis not present

## 2020-08-11 DIAGNOSIS — D631 Anemia in chronic kidney disease: Secondary | ICD-10-CM | POA: Diagnosis not present

## 2020-08-11 DIAGNOSIS — D500 Iron deficiency anemia secondary to blood loss (chronic): Secondary | ICD-10-CM

## 2020-08-11 DIAGNOSIS — K922 Gastrointestinal hemorrhage, unspecified: Secondary | ICD-10-CM | POA: Diagnosis not present

## 2020-08-11 DIAGNOSIS — R7301 Impaired fasting glucose: Secondary | ICD-10-CM | POA: Insufficient documentation

## 2020-08-11 DIAGNOSIS — K529 Noninfective gastroenteritis and colitis, unspecified: Secondary | ICD-10-CM | POA: Diagnosis not present

## 2020-08-11 DIAGNOSIS — M2548 Effusion, other site: Secondary | ICD-10-CM | POA: Diagnosis not present

## 2020-08-11 DIAGNOSIS — I10 Essential (primary) hypertension: Secondary | ICD-10-CM

## 2020-08-11 DIAGNOSIS — E1140 Type 2 diabetes mellitus with diabetic neuropathy, unspecified: Secondary | ICD-10-CM | POA: Diagnosis not present

## 2020-08-11 DIAGNOSIS — G9340 Encephalopathy, unspecified: Secondary | ICD-10-CM | POA: Diagnosis not present

## 2020-08-11 DIAGNOSIS — R4182 Altered mental status, unspecified: Secondary | ICD-10-CM | POA: Diagnosis not present

## 2020-08-11 DIAGNOSIS — N1832 Chronic kidney disease, stage 3b: Secondary | ICD-10-CM | POA: Diagnosis not present

## 2020-08-11 DIAGNOSIS — K56691 Other complete intestinal obstruction: Secondary | ICD-10-CM | POA: Diagnosis not present

## 2020-08-11 DIAGNOSIS — R933 Abnormal findings on diagnostic imaging of other parts of digestive tract: Secondary | ICD-10-CM | POA: Diagnosis not present

## 2020-08-11 DIAGNOSIS — I129 Hypertensive chronic kidney disease with stage 1 through stage 4 chronic kidney disease, or unspecified chronic kidney disease: Secondary | ICD-10-CM | POA: Diagnosis not present

## 2020-08-11 DIAGNOSIS — Z794 Long term (current) use of insulin: Secondary | ICD-10-CM | POA: Diagnosis not present

## 2020-08-11 DIAGNOSIS — E876 Hypokalemia: Secondary | ICD-10-CM | POA: Diagnosis not present

## 2020-08-11 DIAGNOSIS — K449 Diaphragmatic hernia without obstruction or gangrene: Secondary | ICD-10-CM | POA: Diagnosis not present

## 2020-08-11 DIAGNOSIS — Z20822 Contact with and (suspected) exposure to covid-19: Secondary | ICD-10-CM | POA: Diagnosis not present

## 2020-08-11 DIAGNOSIS — E8809 Other disorders of plasma-protein metabolism, not elsewhere classified: Secondary | ICD-10-CM | POA: Diagnosis not present

## 2020-08-11 DIAGNOSIS — K5730 Diverticulosis of large intestine without perforation or abscess without bleeding: Secondary | ICD-10-CM | POA: Diagnosis not present

## 2020-08-11 DIAGNOSIS — R739 Hyperglycemia, unspecified: Secondary | ICD-10-CM | POA: Diagnosis not present

## 2020-08-11 DIAGNOSIS — N261 Atrophy of kidney (terminal): Secondary | ICD-10-CM | POA: Diagnosis not present

## 2020-08-11 DIAGNOSIS — R404 Transient alteration of awareness: Secondary | ICD-10-CM | POA: Diagnosis not present

## 2020-08-11 DIAGNOSIS — I6782 Cerebral ischemia: Secondary | ICD-10-CM | POA: Diagnosis not present

## 2020-08-11 DIAGNOSIS — M1710 Unilateral primary osteoarthritis, unspecified knee: Secondary | ICD-10-CM | POA: Diagnosis not present

## 2020-08-11 DIAGNOSIS — E440 Moderate protein-calorie malnutrition: Secondary | ICD-10-CM | POA: Diagnosis not present

## 2020-08-11 DIAGNOSIS — K6389 Other specified diseases of intestine: Secondary | ICD-10-CM | POA: Diagnosis not present

## 2020-08-11 DIAGNOSIS — J449 Chronic obstructive pulmonary disease, unspecified: Secondary | ICD-10-CM | POA: Diagnosis not present

## 2020-08-11 DIAGNOSIS — I517 Cardiomegaly: Secondary | ICD-10-CM | POA: Diagnosis not present

## 2020-08-11 DIAGNOSIS — E1165 Type 2 diabetes mellitus with hyperglycemia: Secondary | ICD-10-CM | POA: Diagnosis not present

## 2020-08-11 DIAGNOSIS — H748X3 Other specified disorders of middle ear and mastoid, bilateral: Secondary | ICD-10-CM | POA: Diagnosis not present

## 2020-08-11 DIAGNOSIS — Z09 Encounter for follow-up examination after completed treatment for conditions other than malignant neoplasm: Secondary | ICD-10-CM | POA: Diagnosis not present

## 2020-08-11 DIAGNOSIS — K219 Gastro-esophageal reflux disease without esophagitis: Secondary | ICD-10-CM | POA: Diagnosis not present

## 2020-08-11 DIAGNOSIS — E079 Disorder of thyroid, unspecified: Secondary | ICD-10-CM | POA: Diagnosis not present

## 2020-08-11 DIAGNOSIS — D490 Neoplasm of unspecified behavior of digestive system: Secondary | ICD-10-CM | POA: Diagnosis not present

## 2020-08-11 LAB — IRON: Iron: 14 ug/dL — ABNORMAL LOW (ref 28–170)

## 2020-08-11 LAB — BASIC METABOLIC PANEL
Anion gap: 8 (ref 5–15)
BUN: 15 mg/dL (ref 8–23)
CO2: 22 mmol/L (ref 22–32)
Calcium: 8.5 mg/dL — ABNORMAL LOW (ref 8.9–10.3)
Chloride: 108 mmol/L (ref 98–111)
Creatinine, Ser: 0.89 mg/dL (ref 0.44–1.00)
GFR calc Af Amer: >60 mL/min (ref >60)
GFR calc non Af Amer: >60 mL/min (ref >60)
Glucose, Bld: 149 mg/dL — ABNORMAL HIGH (ref 70–99)
Potassium: 3.6 mmol/L (ref 3.5–5.1)
Sodium: 138 mmol/L (ref 135–145)

## 2020-08-11 LAB — CBC
HCT: 27.6 % — ABNORMAL LOW (ref 36.0–46.0)
Hemoglobin: 8.1 g/dL — ABNORMAL LOW (ref 12.0–15.0)
MCH: 27.8 pg (ref 26.0–34.0)
MCHC: 29.3 g/dL — ABNORMAL LOW (ref 30.0–36.0)
MCV: 94.8 fL (ref 80.0–100.0)
Platelets: 363 10*3/uL (ref 150–400)
RBC: 2.91 MIL/uL — ABNORMAL LOW (ref 3.87–5.11)
RDW: 18.4 % — ABNORMAL HIGH (ref 11.5–15.5)
WBC: 7.9 10*3/uL (ref 4.0–10.5)
nRBC: 0 % (ref 0.0–0.2)

## 2020-08-11 LAB — HEMOGLOBIN A1C
Hgb A1c MFr Bld: 6.1 % — ABNORMAL HIGH (ref 4.8–5.6)
Mean Plasma Glucose: 128.37 mg/dL

## 2020-08-11 LAB — TSH: TSH: 1.404 u[IU]/mL (ref 0.350–4.500)

## 2020-08-11 LAB — FERRITIN: Ferritin: 244 ng/mL (ref 11–307)

## 2020-08-11 NOTE — Patient Instructions (Addendum)
F/U in office with mD in 6 weeks, call if you need me sooner  You are being referred [place] MRI pelvis, no contrast to evaluate possible mass  We will ask  Social worker to contact yor daughter to see how help at home can be arranged  I will request a hospital bed , you do qualify  I will reach out Dr Sydell Axon re  need for colonoscopy  Please ensure daughter's name is listed as DPR, she is who we contact re Ms Huffstetler  CBC, iron, ferritin, chem 7 and EGFR, hBA1C and [place] at hospital today please, I will message heme/onc re labs  Careful not to fall  Thankful you are home!  Thanks for choosing [facility], we consider it a privelige to serve you.

## 2020-08-12 ENCOUNTER — Encounter: Payer: Self-pay | Admitting: Family Medicine

## 2020-08-12 ENCOUNTER — Emergency Department (HOSPITAL_COMMUNITY): Payer: Medicare Other

## 2020-08-12 ENCOUNTER — Inpatient Hospital Stay (HOSPITAL_COMMUNITY)
Admission: EM | Admit: 2020-08-12 | Discharge: 2020-08-18 | DRG: 392 | Disposition: A | Discharge status: Home Health | Payer: Medicare Other | Attending: Internal Medicine | Admitting: Internal Medicine

## 2020-08-12 DIAGNOSIS — D62 Acute posthemorrhagic anemia: Secondary | ICD-10-CM | POA: Diagnosis present

## 2020-08-12 DIAGNOSIS — E785 Hyperlipidemia, unspecified: Secondary | ICD-10-CM | POA: Diagnosis present

## 2020-08-12 DIAGNOSIS — K56601 Complete intestinal obstruction, unspecified as to cause: Secondary | ICD-10-CM | POA: Diagnosis present

## 2020-08-12 DIAGNOSIS — Z87891 Personal history of nicotine dependence: Secondary | ICD-10-CM

## 2020-08-12 DIAGNOSIS — J449 Chronic obstructive pulmonary disease, unspecified: Secondary | ICD-10-CM | POA: Diagnosis present

## 2020-08-12 DIAGNOSIS — Z6835 Body mass index (BMI) 35.0-35.9, adult: Secondary | ICD-10-CM

## 2020-08-12 DIAGNOSIS — I129 Hypertensive chronic kidney disease with stage 1 through stage 4 chronic kidney disease, or unspecified chronic kidney disease: Secondary | ICD-10-CM | POA: Diagnosis present

## 2020-08-12 DIAGNOSIS — G7000 Myasthenia gravis without (acute) exacerbation: Secondary | ICD-10-CM | POA: Diagnosis present

## 2020-08-12 DIAGNOSIS — K5732 Diverticulitis of large intestine without perforation or abscess without bleeding: Secondary | ICD-10-CM | POA: Diagnosis present

## 2020-08-12 DIAGNOSIS — E1140 Type 2 diabetes mellitus with diabetic neuropathy, unspecified: Secondary | ICD-10-CM | POA: Diagnosis present

## 2020-08-12 DIAGNOSIS — D509 Iron deficiency anemia, unspecified: Secondary | ICD-10-CM | POA: Diagnosis present

## 2020-08-12 DIAGNOSIS — E440 Moderate protein-calorie malnutrition: Secondary | ICD-10-CM | POA: Diagnosis present

## 2020-08-12 DIAGNOSIS — R531 Weakness: Secondary | ICD-10-CM

## 2020-08-12 DIAGNOSIS — N1832 Chronic kidney disease, stage 3b: Secondary | ICD-10-CM | POA: Diagnosis present

## 2020-08-12 DIAGNOSIS — E119 Type 2 diabetes mellitus without complications: Secondary | ICD-10-CM

## 2020-08-12 DIAGNOSIS — Z20822 Contact with and (suspected) exposure to covid-19: Secondary | ICD-10-CM | POA: Diagnosis present

## 2020-08-12 DIAGNOSIS — E669 Obesity, unspecified: Secondary | ICD-10-CM | POA: Diagnosis present

## 2020-08-12 DIAGNOSIS — K219 Gastro-esophageal reflux disease without esophagitis: Secondary | ICD-10-CM | POA: Diagnosis present

## 2020-08-12 DIAGNOSIS — D490 Neoplasm of unspecified behavior of digestive system: Secondary | ICD-10-CM | POA: Diagnosis present

## 2020-08-12 DIAGNOSIS — Z09 Encounter for follow-up examination after completed treatment for conditions other than malignant neoplasm: Secondary | ICD-10-CM | POA: Insufficient documentation

## 2020-08-12 DIAGNOSIS — M1990 Unspecified osteoarthritis, unspecified site: Secondary | ICD-10-CM | POA: Diagnosis present

## 2020-08-12 DIAGNOSIS — Z6830 Body mass index (BMI) 30.0-30.9, adult: Secondary | ICD-10-CM

## 2020-08-12 DIAGNOSIS — F418 Other specified anxiety disorders: Secondary | ICD-10-CM | POA: Diagnosis present

## 2020-08-12 DIAGNOSIS — K529 Noninfective gastroenteritis and colitis, unspecified: Principal | ICD-10-CM | POA: Diagnosis present

## 2020-08-12 DIAGNOSIS — Z8619 Personal history of other infectious and parasitic diseases: Secondary | ICD-10-CM

## 2020-08-12 DIAGNOSIS — R739 Hyperglycemia, unspecified: Secondary | ICD-10-CM

## 2020-08-12 DIAGNOSIS — G4733 Obstructive sleep apnea (adult) (pediatric): Secondary | ICD-10-CM | POA: Diagnosis present

## 2020-08-12 DIAGNOSIS — E66811 Obesity, class 1: Secondary | ICD-10-CM

## 2020-08-12 DIAGNOSIS — R4182 Altered mental status, unspecified: Secondary | ICD-10-CM | POA: Diagnosis present

## 2020-08-12 DIAGNOSIS — E8809 Other disorders of plasma-protein metabolism, not elsewhere classified: Secondary | ICD-10-CM

## 2020-08-12 DIAGNOSIS — Z79899 Other long term (current) drug therapy: Secondary | ICD-10-CM

## 2020-08-12 DIAGNOSIS — E1122 Type 2 diabetes mellitus with diabetic chronic kidney disease: Secondary | ICD-10-CM | POA: Diagnosis present

## 2020-08-12 DIAGNOSIS — E876 Hypokalemia: Secondary | ICD-10-CM | POA: Diagnosis present

## 2020-08-12 DIAGNOSIS — D631 Anemia in chronic kidney disease: Secondary | ICD-10-CM | POA: Diagnosis present

## 2020-08-12 DIAGNOSIS — Z8719 Personal history of other diseases of the digestive system: Secondary | ICD-10-CM

## 2020-08-12 DIAGNOSIS — Z596 Low income: Secondary | ICD-10-CM

## 2020-08-12 DIAGNOSIS — R1900 Intra-abdominal and pelvic swelling, mass and lump, unspecified site: Secondary | ICD-10-CM | POA: Diagnosis present

## 2020-08-12 DIAGNOSIS — Z9049 Acquired absence of other specified parts of digestive tract: Secondary | ICD-10-CM

## 2020-08-12 DIAGNOSIS — G9340 Encephalopathy, unspecified: Secondary | ICD-10-CM | POA: Diagnosis present

## 2020-08-12 DIAGNOSIS — E1165 Type 2 diabetes mellitus with hyperglycemia: Secondary | ICD-10-CM | POA: Diagnosis present

## 2020-08-12 DIAGNOSIS — F419 Anxiety disorder, unspecified: Secondary | ICD-10-CM | POA: Diagnosis present

## 2020-08-12 DIAGNOSIS — E079 Disorder of thyroid, unspecified: Secondary | ICD-10-CM | POA: Diagnosis present

## 2020-08-12 DIAGNOSIS — K922 Gastrointestinal hemorrhage, unspecified: Secondary | ICD-10-CM | POA: Diagnosis present

## 2020-08-12 LAB — URINALYSIS, ROUTINE W REFLEX MICROSCOPIC
Bilirubin Urine: NEGATIVE
Glucose, UA: NEGATIVE mg/dL
Hgb urine dipstick: NEGATIVE
Ketones, ur: NEGATIVE mg/dL
Leukocytes,Ua: NEGATIVE
Nitrite: NEGATIVE
Protein, ur: NEGATIVE mg/dL
Specific Gravity, Urine: 1.008 (ref 1.005–1.030)
pH: 5 (ref 5.0–8.0)

## 2020-08-12 LAB — CBC WITH DIFFERENTIAL/PLATELET
Abs Immature Granulocytes: 0.18 10*3/uL — ABNORMAL HIGH (ref 0.00–0.07)
Basophils Absolute: 0 10*3/uL (ref 0.0–0.1)
Basophils Relative: 0 %
Eosinophils Absolute: 0.1 10*3/uL (ref 0.0–0.5)
Eosinophils Relative: 1 %
HCT: 27.8 % — ABNORMAL LOW (ref 36.0–46.0)
Hemoglobin: 8.4 g/dL — ABNORMAL LOW (ref 12.0–15.0)
Immature Granulocytes: 1 %
Lymphocytes Relative: 6 %
Lymphs Abs: 1 10*3/uL (ref 0.7–4.0)
MCH: 27.9 pg (ref 26.0–34.0)
MCHC: 30.2 g/dL (ref 30.0–36.0)
MCV: 92.4 fL (ref 80.0–100.0)
Monocytes Absolute: 1 10*3/uL (ref 0.1–1.0)
Monocytes Relative: 6 %
Neutro Abs: 13.9 10*3/uL — ABNORMAL HIGH (ref 1.7–7.7)
Neutrophils Relative %: 86 %
Platelets: 369 10*3/uL (ref 150–400)
RBC: 3.01 MIL/uL — ABNORMAL LOW (ref 3.87–5.11)
RDW: 18.2 % — ABNORMAL HIGH (ref 11.5–15.5)
WBC: 16.2 10*3/uL — ABNORMAL HIGH (ref 4.0–10.5)
nRBC: 0.2 % (ref 0.0–0.2)

## 2020-08-12 LAB — RAPID URINE DRUG SCREEN, HOSP PERFORMED
Amphetamines: NOT DETECTED
Barbiturates: NOT DETECTED
Benzodiazepines: NOT DETECTED
Cocaine: NOT DETECTED
Opiates: NOT DETECTED
Tetrahydrocannabinol: NOT DETECTED

## 2020-08-12 LAB — COMPREHENSIVE METABOLIC PANEL
ALT: 19 U/L (ref 0–44)
AST: 19 U/L (ref 15–41)
Albumin: 2.2 g/dL — ABNORMAL LOW (ref 3.5–5.0)
Alkaline Phosphatase: 94 U/L (ref 38–126)
Anion gap: 7 (ref 5–15)
BUN: 12 mg/dL (ref 8–23)
CO2: 23 mmol/L (ref 22–32)
Calcium: 8.5 mg/dL — ABNORMAL LOW (ref 8.9–10.3)
Chloride: 106 mmol/L (ref 98–111)
Creatinine, Ser: 0.94 mg/dL (ref 0.44–1.00)
GFR calc Af Amer: >60 mL/min (ref >60)
GFR calc non Af Amer: 59 mL/min — ABNORMAL LOW (ref >60)
Glucose, Bld: 178 mg/dL — ABNORMAL HIGH (ref 70–99)
Potassium: 3.8 mmol/L (ref 3.5–5.1)
Sodium: 136 mmol/L (ref 135–145)
Total Bilirubin: 0.4 mg/dL (ref 0.3–1.2)
Total Protein: 6.4 g/dL — ABNORMAL LOW (ref 6.5–8.1)

## 2020-08-12 LAB — TROPONIN I (HIGH SENSITIVITY)
Troponin I (High Sensitivity): 10 ng/L (ref <18)
Troponin I (High Sensitivity): 9 ng/L (ref <18)

## 2020-08-12 LAB — ETHANOL: Alcohol, Ethyl (B): <10 mg/dL (ref <10)

## 2020-08-12 LAB — SARS CORONAVIRUS 2 BY RT PCR (HOSPITAL ORDER, PERFORMED IN ~~LOC~~ HOSPITAL LAB): SARS Coronavirus 2: NEGATIVE

## 2020-08-12 LAB — CBG MONITORING, ED: Glucose-Capillary: 173 mg/dL — ABNORMAL HIGH (ref 70–99)

## 2020-08-12 LAB — AMMONIA: Ammonia: 28 umol/L (ref 9–35)

## 2020-08-12 MED ORDER — IOHEXOL 300 MG/ML  SOLN
100.0000 mL | Freq: Once | INTRAMUSCULAR | Status: AC | PRN
  Administered 2020-08-12: 100 mL via INTRAVENOUS

## 2020-08-12 MED ORDER — SODIUM CHLORIDE 0.9 % IV BOLUS
30.0000 mL/kg | Freq: Once | INTRAVENOUS | Status: AC
  Administered 2020-08-12: 1779 mL via INTRAVENOUS

## 2020-08-12 MED ORDER — PIPERACILLIN-TAZOBACTAM 3.375 G IVPB 30 MIN
3.3750 g | Freq: Once | INTRAVENOUS | Status: AC
  Administered 2020-08-12: 3.375 g via INTRAVENOUS
  Filled 2020-08-12: qty 50

## 2020-08-12 NOTE — Assessment & Plan Note (Signed)
Severe generalized arthritis with marked limitation in mobility, needs hospital bed to assist in change in position and to provide comfort

## 2020-08-12 NOTE — Assessment & Plan Note (Signed)
Patient in for follow up of recent hospitalization. Discharge summary, and laboratory and radiology data are reviewed, and any questions or concerns  are discussed. Specific issues requiring follow up are specifically addressed.  

## 2020-08-12 NOTE — Assessment & Plan Note (Signed)
Reports ongoing rectal bleeding awaiting GI follow up, denies fever, no abdominal tenderness or rebound on exam

## 2020-08-12 NOTE — Progress Notes (Signed)
   [name]     MRN: [id number]      DOB: 1941/01/13   HPI [name] is here for follow up of recent hospitalizations and SNF stay for GI bleeding.also had recurrent  C dif colitis States she is continuing to see rectal blood , has been home for 3 days. Hospital and SNF course reviewed and appropriate labs iand imaging studies ordered Is weak, unable to care for herself independently here with her daughter and unable to get in home help Requests hospital bed , which she does need, has severe arthritis making it difficult to change posiition and achieve achieve level of comfort/ pain relief Also needs head elevated above 30 degrees to avoid aspiration. Hospital bed will also reduce recurrent decubitus ulcers which she recently had  With increased ease of change in position   ROS Denies recent fever or chills. Denies sinus pressure, nasal congestion, ear pain or sore throat. Denies chest congestion, productive cough or wheezing. Denies chest pains, palpitations and leg swelling    C/o urinary  incontinence. C/o chronic joint pain, swelling and limitation in mobility. . Denies depression, c/o anxiety ia. Denies current skin break down or rash.   PE  BP (!) 146/70   Pulse 74   Resp 16   Ht 5\' 6"  (1.676 m)   Wt 228 lb 1.3 oz (103.5 kg)   SpO2 93%   BMI 36.81 kg/m   Patient sleepy and oriented and in no cardiopulmonary distress.Chronicaly ill appearing  HEENT: No facial asymmetry, EOMI,     Neck decreased ROM.  Chest: Clear to auscultation bilaterally.  CVS: S1, S2 no murmurs, no S3.Regular rate.  ABD: Soft non tender.   Ext: No edema  XH:[id number] reduced  ROM spine, shoulders, hips and knees.  Skin: Intact, no ulcerations or rash noted.  Psych: Good eye contact, flat affect. Memory intact not anxious and mildly  depressed appearing.  CNS: CN 2-12 intact, power,  normal throughout.no focal deficits noted.   [facility] discharge  follow-up Patient in for follow up of recent hospitalization. Discharge summary, and laboratory and radiology data are reviewed, and any questions or concerns  are discussed. Specific issues requiring follow up are specifically addressed.   GI bleed Reports ongoing rectal bleeding awaiting GI follow up, denies fever, no abdominal tenderness or rebound on exam  Pelvic mass in female [place] inconclusive, will order MRI without contrast  Generalized osteoarthritis Severe generalized arthritis with marked limitation in mobility, needs hospital bed to assist in change in position and to provide comfort

## 2020-08-12 NOTE — Addendum Note (Signed)
Addended by: Fayrene Helper on: 08/12/2020 09:45 PM   Modules accepted: Orders

## 2020-08-12 NOTE — ED Notes (Signed)
Pt arouses to voice, pt shook her head no when asked about pain.  When asked about place and year, pt would not answer and closed her eyes.

## 2020-08-12 NOTE — ED Provider Notes (Signed)
[facility] EMERGENCY DEPARTMENT Provider Note   CSN: [id number] Arrival date & time: 08/12/20  1646     History Chief Complaint  Patient presents with  . Altered Mental Status    [name] is a 79 y.o. female history of C. difficile colitis 2 months ago, presented emergency department with acute confusion.  Patient cannot provide much history on my exam, is repeatedly dosing off.  Her daughter presents at bedside provides additional history.  Her daughter reports the patient was discharged home from a nursing facility 3 days ago.  The patient lives by herself at home.  For the past 2 days the family members noted the patient seem more confused than normal.  She was unable to answer simple questions.  Typically the patient is independent and alert and fully oriented.  Her daughter is also concerned that the patient has had poor appetite and seemed excessively somnolent and drowsy at home for the past 2 days.   HPI     Past Medical History:  Diagnosis Date  . Anemia in chronic renal disease 12/30/2015  . Anxiety   . Arthritis    "all over" (01/19/2014)  . Arthritis, lumbar spine   . Carpal tunnel syndrome   . Chronic bronchitis ([place])    "got it q yr for awhile; hasn't had it in awhile" (01/19/2014)  . Chronic kidney disease (CKD) stage G3b/A1, moderately decreased glomerular filtration rate (GFR) between 30-44 mL/min/1.73 square meter and albuminuria creatinine ratio less than 30 mg/g 09/14/2009   Qualifier: Diagnosis of  By: Moshe Cipro MD, Joycelyn Schmid    . Complication of anesthesia    combative  . COPD (chronic obstructive pulmonary disease) ([place])   . Depression   . Diverticulosis 07/2004   Colonscopy Dr Gala Romney  . Esophagitis, erosive 2009  . Exertional shortness of breath   . Gastroesophageal reflux   . YQIHKVQQ(595.6)    "usually a couple times/wk" (01/19/2014)  . Heart murmur    saw cardiology In [place], he told her she did not need to come back.  . Hyperlipidemia   .  Hypertension   . Impingement syndrome, shoulder   . Iron deficiency anemia   . Mitral regurgitation   . Myasthenia gravis   . Myasthenia gravis in remission ([place]) 11/24/2014  . Obesity   . OSA on CPAP    Negative on last sleep study  . Pneumonia 01/2012  . Pulmonary HTN ([place])   . Schatzki's ring    Last EGD w/ dilation 02/08/11, 2009 & 2007  . Seasonal allergies   . Shoulder pain   . Thyroid disease    "used to take RX; they took me off it" (01/19/2014)  . Tobacco abuse   . Type II diabetes mellitus [facility])     Patient Active Problem List   Diagnosis Date Noted  . Altered mental status 08/13/2020  . History of GI bleed 08/13/2020  . Hyperglycemia 08/13/2020  . Hospital discharge follow-up 08/12/2020  . Diarrhea 07/15/2020  . GI bleed 07/03/2020  . Abnormal CT scan, colon 07/02/2020  . Normochromic normocytic anemia 06/22/2020  . Hypertension associated with stage 3 chronic kidney disease due to type 2 diabetes mellitus ([place]) 06/21/2020  . Chronic non-seasonal allergic rhinitis 06/21/2020  . Type 2 diabetes mellitus with diabetic neuropathy, unspecified ([place]) 06/21/2020  . Hyperlipidemia associated with type 2 diabetes mellitus ([place]) 06/21/2020  . Diabetic peripheral neuropathy associated with type 2 diabetes mellitus ([place]) 06/21/2020  . Urinary urgency 06/21/2020  . Pelvic mass  in female 06/21/2020  . Acute blood loss anemia 06/21/2020  . COPD (chronic obstructive pulmonary disease) ([place]) 06/21/2020  . Increased intraocular pressure, bilateral 06/21/2020  . Enteritis due to Clostridium difficile 06/18/2020  . Acute colitis 06/12/2020  . Hypomagnesemia 06/12/2020  . Colitis 06/12/2020  . Osteoarthritis of both knees 05/24/2020  . Elevated alkaline phosphatase measurement 10/21/2019  . Back pain 07/05/2019  . Rectal bleeding 01/08/2019  . Chronic constipation 01/08/2019  . Nausea without vomiting 11/03/2018  . Trigger finger, right index finger   . Monoclonal gammopathy of  unknown significance (MGUS) 11/26/2017  . Memory loss of unknown cause 11/10/2017  . Chronic left shoulder pain 09/14/2017  . Anemia in chronic renal disease 12/30/2015  . Morbid obesity ([place]) 04/18/2015  . Myasthenia gravis in remission ([place]) 11/24/2014  . Myasthenia gravis ([place]) 11/16/2014  . Vitamin D deficiency 05/24/2014  . Osteopenia 04/26/2014  . Macular hole of left eye 01/19/2014  . Generalized osteoarthritis 05/25/2013  . Esophageal dysphagia 04/03/2012  . Abnormal chest CT 03/02/2012  . Insomnia 01/02/2012  . Malignant hypertension 05/30/2011  . Schatzki's ring 05/07/2011  . INGROWN TOENAIL 01/10/2011  . Unspecified glaucoma 08/08/2010  . DYSCHROMIA, UNSPECIFIED 08/08/2010  . CKD stage 3 due to type 2 diabetes mellitus ([place]) 09/14/2009  . HIP PAIN, LEFT 06/09/2009  . Urinary incontinence 06/09/2009  . Obesity (BMI 30.0-34.9) 03/06/2009  . IDA (iron deficiency anemia) 10/02/2008  . Hypothyroidism 09/27/2008  . Hyperlipemia 06/30/2008  . Depression with anxiety 06/30/2008  . GERD 06/30/2008  . Obstructive sleep apnea 06/30/2008  . CARPAL TUNNEL SYNDROME 05/19/2008  . SHOULDER PAIN 02/02/2008  . IMPINGEMENT SYNDROME 02/02/2008  . DEGENERATIVE JOINT DISEASE, KNEE 11/24/2007  . Type 2 diabetes mellitus ([place]) 09/09/2007    Past Surgical History:  Procedure Laterality Date  . [place] VITRECTOMY WITH 20 GAUGE MVR PORT Left 01/19/2014   Procedure: 25 GAUGE PARS PLANA VITRECTOMY WITH 20 GAUGE MVR PORT; MEMBRAME PEEL; SERUM PATCH; LASER TREATMENT; C3F8;  Surgeon: Hayden Pedro, MD;  Location: [facility];  Service: Ophthalmology;  Laterality: Left;  . ABDOMINAL HYSTERECTOMY    . AGILE CAPSULE N/A 08/05/2019   Procedure: AGILE CAPSULE;  Surgeon: Daneil Dolin, MD;  [facility];  Service: Endoscopy;  Laterality: N/A;  7:30am  . APPENDECTOMY    . CARPAL TUNNEL RELEASE Bilateral   . CATARACT EXTRACTION W/PHACO Left 10/21/2013   Procedure: LEFT CATARACT  EXTRACTION PHACO AND INTRAOCULAR LENS PLACEMENT (IOC);  Surgeon: Marylynn Pearson, MD;  Location: [facility];  Service: Ophthalmology;  Laterality: Left;  . CHOLECYSTECTOMY    . COLONOSCOPY  05/08/2012   YHC:WCBJSEGB and external hemorrhoids; colonic diverticulosis  . COLONOSCOPY WITH PROPOFOL N/A 02/26/2019   two simple adenomas, diverticulosis,  and internal hemorrhoids.   . ESOPHAGEAL DILATION     "more than 3 times" (01/19/2014)  . ESOPHAGOGASTRODUODENOSCOPY  02/08/11   [place]k-Distal esophageal erosion consistent with mild erosive reflux   esophagitis/ Noncritical Schatzki ring, small hiatal hernia otherwise upper/ gastrointestinal tract appeared unremarkable, status post passage  of a Maloney dilation to biopsy disruption of the ring described  . ESOPHAGOGASTRODUODENOSCOPY  04/2012   2 tandem incomplete distal esophagea rings s/p dilation.   . ESOPHAGOGASTRODUODENOSCOPY N/A 09/16/2014   Dr. Gala Romney: Schatzki ring status post dilation/disruption.  Hiatal hernia.  Marland Kitchen ESOPHAGOGASTRODUODENOSCOPY (EGD) WITH PROPOFOL N/A 12/08/2018   EGD with mild Schatzki ring s/p dilation, small hiatal hernia, otherwise normal  . EYE SURGERY     cataracts, bilateral  .  GIVENS CAPSULE STUDY N/A 09/29/2019   Procedure: GIVENS CAPSULE STUDY;  Surgeon: Daneil Dolin, MD;  [facility];  Service: Endoscopy;  Laterality: N/A;  7:30am  . INCONTINENCE SURGERY  08/26/09   [place]  . JOINT REPLACEMENT     right total knee  . MALONEY DILATION N/A 09/16/2014   Procedure: Venia Minks DILATION;  Surgeon: Daneil Dolin, MD;  [facility];  Service: Endoscopy;  Laterality: N/A;  . Venia Minks DILATION N/A 12/08/2018   Procedure: Venia Minks DILATION;  Surgeon: Daneil Dolin, MD;  [facility];  Service: Endoscopy;  Laterality: N/A;  . PARS PLANA VITRECTOMY W/ REPAIR OF MACULAR HOLE Left 01/19/2014  . POLYPECTOMY  02/26/2019   Procedure: POLYPECTOMY;  Surgeon: Danie Binder, MD;  [facility];  Service:  Endoscopy;;  ascending colon polyps x2  . SAVORY DILATION N/A 09/16/2014   Procedure: SAVORY DILATION;  Surgeon: Daneil Dolin, MD;  [facility];  Service: Endoscopy;  Laterality: N/A;  . TONSILLECTOMY    . TOTAL KNEE ARTHROPLASTY Right 05/13/07   Dr. Aline Brochure  . TRIGGER FINGER RELEASE Right 06/26/2018   Procedure: RIGHT INDEX FINGER TRIGGER FINGER/A-1 PULLEY RELEASE;  Surgeon: Carole Civil, MD;  Location: [facility];  Service: Orthopedics;  Laterality: Right;     OB History   No obstetric history on file.     Family History  Problem Relation Age of Onset  . Cancer Mother   . Heart disease Mother   . Cancer Father   . Heart disease Father   . Diabetes Sister   . Hypertension Brother   . Heart disease Brother   . Cancer Sister   . Kidney failure Sister   . Diabetes Son   . Hypertension Son   . Hypertension Daughter   . Colon cancer Neg Hx     Social History   Tobacco Use  . Smoking status: Former Smoker    Packs/day: 0.50    Years: 25.00    Pack years: 12.50    Types: Cigarettes    Quit date: 01/01/2004    Years since quitting: 16.6  . Smokeless tobacco: Never Used  Vaping Use  . Vaping Use: Never used  Substance Use Topics  . Alcohol use: No  . Drug use: No    Home Medications Prior to Admission medications   Medication Sig Start Date End Date Taking? Authorizing Provider  acetaminophen (TYLENOL) 325 MG tablet Take 2 tablets (650 mg total) by mouth every 6 (six) hours as needed for mild pain, fever or headache (or Fever >/= 101). 06/20/20   Emokpae, Courage, MD  albuterol (PROAIR HFA) 108 (90 Base) MCG/ACT inhaler Inhale 2 puffs into the lungs every 6 (six) hours as needed. 08/08/20   Gerlene Fee, NP  B-D ULTRAFINE III SHORT PEN 31G X 8 MM MISC USE TO INJECT INSULIN 08/10/20   Fayrene Helper, MD  brimonidine-timolol (COMBIGAN) 0.2-0.5 % ophthalmic solution Place 1 drop into both eyes every 12 (twelve) hours. 08/08/20   Gerlene Fee, NP    cetirizine (ZYRTEC) 10 MG tablet TAKE 1 TABLET BY MOUTH ONCE A DAY. 05/17/20   Fayrene Helper, MD  Cholecalciferol (VITAMIN D3) 2000 units TABS Take 2,000 Units by mouth at bedtime.    [provider]  cholestyramine Lucrezia Starch) 4 GM/DOSE powder Take 1 packet (4 g total) by mouth 2 (two) times daily with a meal. For stool bulking 08/08/20   Ok Edwards  S, NP  citalopram (CELEXA) 10 MG tablet Take 1 tablet (10 mg total) by mouth daily. 08/08/20   Gerlene Fee, NP  Cranberry 1000 MG CAPS Take 1,000 mg by mouth daily.    [provider]  cycloSPORINE (RESTASIS) 0.05 % ophthalmic emulsion Place 1 drop into both eyes 2 (two) times daily. 08/08/20   Gerlene Fee, NP  diltiazem (CARDIZEM CD) 120 MG 24 hr capsule Take 1 capsule (120 mg total) by mouth daily. 08/08/20   Gerlene Fee, NP  ezetimibe (ZETIA) 10 MG tablet Take 1 tablet (10 mg total) by mouth daily. 08/08/20   Gerlene Fee, NP  famotidine (PEPCID) 40 MG tablet Take 1 tablet (40 mg total) by mouth every evening. 08/08/20   Gerlene Fee, NP  fluticasone (FLONASE) 50 MCG/ACT nasal spray Place 2 sprays into both nostrils daily. 08/08/20   Gerlene Fee, NP  gabapentin (NEURONTIN) 300 MG capsule TAKE (1) CAPSULE BY MOUTH TWICE DAILY. 08/08/20   Gerlene Fee, NP  hydrocortisone (ANUSOL-HC) 2.5 % rectal cream Place 1 application rectally 2 (two) times daily. 07/01/20   Erenest Rasher, PA-C  insulin glargine (LANTUS SOLOSTAR) 100 UNIT/ML Solostar Pen Inject 6 Units into the skin at bedtime. 08/08/20   Gerlene Fee, NP  insulin lispro (HUMALOG KWIKPEN) 100 UNIT/ML KwikPen Inject 0.03 mLs (3 Units total) into the skin 3 (three) times daily. insulin pen; 100 unit/mL; amt: Per Sliding Scale; If Blood Sugar is 0 to 149, give 0 Units. If Blood Sugar is greater than 149, give 3 Units. subcutaneous Before Meals 08/08/20   Gerlene Fee, NP  linagliptin (TRADJENTA) 5 MG TABS tablet Take 1 tablet (5 mg total) by mouth daily.  08/08/20   Gerlene Fee, NP  losartan (COZAAR) 25 MG tablet Take 1 tablet (25 mg total) by mouth daily. 08/08/20   Gerlene Fee, NP  Multiple Vitamin (MULTIVITAMIN WITH MINERALS) TABS tablet Take 1 tablet by mouth at bedtime.    [provider]  nystatin (NYSTATIN) powder Apply 1 application topically daily as needed. Special Instructions: fungal rash to groin and inguinal folds 06/20/20   [provider]  ondansetron (ZOFRAN) 4 MG tablet Take 1 tablet (4 mg total) by mouth every 8 (eight) hours as needed for nausea or vomiting. 07/01/20   Erenest Rasher, PA-C  predniSONE (DELTASONE) 5 MG tablet Take 1 tablet (5 mg total) by mouth daily. 08/08/20   Gerlene Fee, NP  Probiotic Product (RISA-BID PROBIOTIC PO) Take 1 tablet by mouth in the morning and at bedtime.    [provider]  pyridostigmine (MESTINON) 60 MG tablet Take three times daily 08/08/20   Gerlene Fee, NP  rosuvastatin (CRESTOR) 40 MG tablet TAKE (1) TABLET BY MOUTH AT BEDTIME. 08/08/20   Gerlene Fee, NP  solifenacin (VESICARE) 10 MG tablet Take 1 tablet (10 mg total) by mouth daily. 08/08/20   Gerlene Fee, NP  venlafaxine XR (EFFEXOR-XR) 150 MG 24 hr capsule Take 1 capsule (150 mg total) by mouth daily. 08/08/20   Gerlene Fee, NP    Allergies    Patient has no known allergies.  Review of Systems   Review of Systems  Unable to perform ROS: Dementia (level 5 caveat)    Physical Exam Updated Vital Signs BP (!) 157/71   Pulse 62   Temp (!) 97.1 F (36.2 C) (Rectal)   Resp 16   SpO2 92%   Physical Exam  Vitals and nursing note reviewed.  Constitutional:      General: She is not in acute distress.    Appearance: She is well-developed.     Comments: Somnolent, falls asleep on exam  HENT:     Head: Normocephalic and atraumatic.  Eyes:     Conjunctiva/sclera: Conjunctivae normal.  Cardiovascular:     Rate and Rhythm: Normal rate and regular rhythm.     Pulses: Normal pulses.    Pulmonary:     Effort: Pulmonary effort is normal. No respiratory distress.  Abdominal:     Palpations: Abdomen is soft.     Tenderness: There is no abdominal tenderness.  Musculoskeletal:     Cervical back: Neck supple.  Skin:    General: Skin is warm and dry.  Neurological:     Mental Status: She is alert.     ED Results / Procedures / Treatments   Labs (all labs ordered are listed, but only abnormal results are displayed) Labs Reviewed  COMPREHENSIVE METABOLIC PANEL - Abnormal; Notable for the following components:      Result Value   Glucose, Bld 178 (*)    Calcium 8.5 (*)    Total Protein 6.4 (*)    Albumin 2.2 (*)    GFR calc non Af Amer 59 (*)    All other components within normal limits  CBC WITH DIFFERENTIAL/PLATELET - Abnormal; Notable for the following components:   WBC 16.2 (*)    RBC 3.01 (*)    Hemoglobin 8.4 (*)    HCT 27.8 (*)    RDW 18.2 (*)    Neutro Abs 13.9 (*)    Abs Immature Granulocytes 0.18 (*)    All other components within normal limits  PROTIME-INR - Abnormal; Notable for the following components:   Prothrombin Time 15.7 (*)    INR 1.3 (*)    All other components within normal limits  CBG MONITORING, ED - Abnormal; Notable for the following components:   Glucose-Capillary 173 (*)    All other components within normal limits  POC OCCULT BLOOD, ED - Abnormal; Notable for the following components:   Fecal Occult Bld POSITIVE (*)    All other components within normal limits  SARS CORONAVIRUS 2 BY RT PCR (HOSPITAL ORDER, [facility])  CULTURE, BLOOD (SINGLE)  URINE CULTURE  URINALYSIS, ROUTINE W REFLEX MICROSCOPIC  AMMONIA  ETHANOL  RAPID URINE DRUG SCREEN, HOSP PERFORMED  PROCALCITONIN  LACTIC ACID, PLASMA  LACTIC ACID, PLASMA  APTT  COMPREHENSIVE METABOLIC PANEL  CBC  PROTIME-INR  MAGNESIUM  PHOSPHORUS  TROPONIN I (HIGH SENSITIVITY)  TROPONIN I (HIGH SENSITIVITY)    EKG EKG  Interpretation  Date/Time:  Friday August 12 2020 16:51:12 EDT Ventricular Rate:  65 PR Interval:    QRS Duration: 88 QT Interval:  478 QTC Calculation: 498 R Axis:   24 Text Interpretation: Sinus rhythm Abnormal R-wave progression, early transition Borderline prolonged QT interval No STEMI Confirmed by Octaviano Glow [phone]) on 08/12/2020 5:34:14 PM   Radiology CT Head Wo Contrast  Result Date: 08/12/2020 CLINICAL DATA:  Mental status change, unknown cause. Additional history provided: Confusion. EXAM: CT HEAD WITHOUT CONTRAST TECHNIQUE: Contiguous axial images were obtained from the base of the skull through the vertex without intravenous contrast. COMPARISON:  Brain MRI 05/18/2013 FINDINGS: Brain: Mild generalized parenchymal atrophy. Mild ill-defined hypoattenuation within the cerebral white matter is nonspecific, but consistent with chronic small vessel ischemic disease. Findings are similar as compared to the MRI of  05/18/2013. There is no acute intracranial hemorrhage. No demarcated cortical infarct is identified. No extra-axial fluid collection. No evidence of intracranial mass. No midline shift. Partially empty sella turcica. Vascular: No hyperdense vessel.  Atherosclerotic calcifications. Skull: Normal. Negative for fracture or focal lesion. Sinuses/Orbits: Visualized orbits show no acute finding. Sphenoid sinus mucosal thickening, moderate on the right, mild on the left). Bilateral mastoid effusions. IMPRESSION: No CT evidence of acute intracranial abnormality. Mild generalized parenchymal atrophy and chronic small vessel ischemic disease, without appreciable change as compared to the MRI of 05/18/2013. Sphenoid sinusitis. Bilateral mastoid effusions. Electronically Signed   By: Kellie Simmering DO   On: 08/12/2020 18:07   CT ABDOMEN PELVIS W CONTRAST  Result Date: 08/12/2020 CLINICAL DATA:  79 year old female with abdominal pain and fever. EXAM: CT ABDOMEN AND PELVIS WITH CONTRAST  TECHNIQUE: Multidetector CT imaging of the abdomen and pelvis was performed using the standard protocol following bolus administration of intravenous contrast. CONTRAST:  153mL OMNIPAQUE IOHEXOL 300 MG/ML  SOLN COMPARISON:  CT abdomen pelvis dated 06/12/2020. FINDINGS: Lower chest: There are bibasilar dependent atelectatic changes. There is mild cardiomegaly. Three vessel coronary vascular calcification. There is no intra-abdominal free air or free fluid. Hepatobiliary: Probable mild fatty liver. No intrahepatic biliary ductal dilatation. Cholecystectomy. No retained calcified stone noted in the central CBD. Pancreas: Unremarkable. No pancreatic ductal dilatation or surrounding inflammatory changes. Spleen: Normal in size without focal abnormality. Adrenals/Urinary Tract: The adrenal glands unremarkable. There is mild bilateral renal parenchyma atrophy and cortical irregularity and scarring. Small bilateral renal cysts and subcentimeter hypodensities which are too small to characterize. There is no hydronephrosis on either side. There is symmetric enhancement and excretion of contrast by both kidneys. The visualized ureters and urinary bladder appear unremarkable. Stomach/Bowel: There is diffuse diverticulosis of the descending and sigmoid colon. There is long segment circumferential thickening of the descending and sigmoid colon with stranding of the adjacent fat which may be inflammatory/infectious in etiology. A colonic mass is not excluded. Overall similar or slight progression of the inflammatory changes compared to the prior CT. Multiple scattered rounded lymph nodes again seen along this segment of the colon as seen on the prior CT. There is a small hiatal hernia. Moderate stool noted throughout the colon. No bowel obstruction. Appendectomy. Vascular/Lymphatic: Moderate aortoiliac atherosclerotic disease. The IVC is unremarkable. No portal venous gas. There is no adenopathy. Reproductive: Hysterectomy. There  is a 2.5 cm right adnexal cyst. Other: None Musculoskeletal: Degenerative changes of the spine. No acute osseous pathology. IMPRESSION: 1. Distal colonic diverticulosis with long segment circumferential thickening of the descending and sigmoid colon. Overall similar or slightly progression of inflammatory changes compared to the prior CT. Findings may the inflammatory/infectious in etiology. Underlying colonic mass is not excluded. Clinical correlation and further evaluation with colonoscopy on a nonemergent/outpatient basis and following resolution of acute symptoms recommended. 2. A 2.5 cm right adnexal cyst. Further evaluation with pelvic ultrasound on a nonemergent basis recommended. 3. Aortic Atherosclerosis (ICD10-I70.0). Electronically Signed   By: Anner Crete M.D.   On: 08/12/2020 23:33   DG Chest Portable 1 View  Result Date: 08/12/2020 CLINICAL DATA:  Infection workup EXAM: PORTABLE CHEST 1 VIEW COMPARISON:  04/29/2020 FINDINGS: Cardiomegaly. No confluent airspace opacities, effusions or edema. No acute bony abnormality. IMPRESSION: Cardiomegaly.  No active disease. Electronically Signed   By: Rolm Baptise M.D.   On: 08/12/2020 18:28    Procedures Procedures (including critical care time)  Medications Ordered in ED Medications  sodium chloride 0.9 %  bolus 1,779 mL (0 mL/kg  59.3 kg (Ideal) Intravenous Stopped 08/13/20 0057)  iohexol (OMNIPAQUE) 300 MG/ML solution 100 mL (100 mLs Intravenous Contrast Given 08/12/20 2321)  piperacillin-tazobactam (ZOSYN) IVPB 3.375 g ( Intravenous Stopped 08/13/20 0022)    ED Course  I have reviewed the triage vital signs and the nursing notes.  Pertinent labs & imaging results that were available during my care of the patient were reviewed by me and considered in my medical decision making (see chart for details).  79 yo female presenting to ED with acutely worsening confusion for past 2-3 days.  History largely provided by patient's daughter at  bedside as patient is somnolent.    The patient does appear confused, is continuously falling asleep during exam.  DDx includes infection vs polypharmacy vs ACS vs other  Workup showed ACS wnl, ECG is nonischemic per my interpretation.  Doubt ACS.  UA negative for infection.  Xray chest with no focal infiltrates.  Covid negative.  WBC elevated at 16K without multiple SIRS criteria or fever to suggest sepsis.   Hgb near recent baseline - doubt anemia  Ammonia wnl.  Glucose wnl.  I personally reviewed the patient's labs, her recent hospitalization course, and obtained additional history from her family member.  After this workup was complete, I discussed the case with the hospitalist who recommended CT abdomen pelvis to evaluate for other source of leukocytosis or infection, and then advised an empiric dose of IV zosyn after noting the colitis.  Unclear if this is new or old at this time.  Clinical Course as of Aug 14 131  Fri Aug 12, 2020  2342 IMPRESSION: 1. Distal colonic diverticulosis with long segment circumferential thickening of the descending and sigmoid colon. Overall similar or slightly progression of inflammatory changes compared to the prior CT. Findings may the inflammatory/infectious in etiology. Underlying colonic mass is not excluded. Clinical correlation and further evaluation with colonoscopy on a nonemergent/outpatient basis and following resolution of acute symptoms recommended. 2. A 2.5 cm right adnexal cyst. Further evaluation with pelvic ultrasound on a nonemergent basis recommended. 3. Aortic Atherosclerosis (ICD10-I70.0).    [MT]  2349 I discussed the case with hospitalist Dr Josephine Cables who recommended an empiric dose of IV zosyn here as it is unclear if she may have a new infectious colitis, but with her leukocytosis this may be a prudent course of treatment.  There is no other evidence source of infection at this time.  Nursing asked to repeat vitals and  additional infection labs added   [MT]  2351 Admitted to hospitalist   [MT]    Clinical Course User Index [MT] Farhan Jean, Carola Rhine, MD    Final Clinical Impression(s) / ED Diagnoses Final diagnoses:  Altered mental status, unspecified altered mental status type    Rx / DC Orders ED Discharge Orders    None       Aysha Livecchi, Carola Rhine, MD 08/13/20 [phone]

## 2020-08-12 NOTE — ED Triage Notes (Signed)
Pt was discharged from [facility] on Tuesday. Was alert and oriented x4. Now, she is completely confused. LKW was 10 pm last night. Pt hasn't been eating or drinking well. CBG was 198 with EMS.

## 2020-08-12 NOTE — Assessment & Plan Note (Signed)
[place] inconclusive, will order MRI without contrast

## 2020-08-13 ENCOUNTER — Encounter (HOSPITAL_COMMUNITY): Payer: Self-pay | Admitting: Internal Medicine

## 2020-08-13 ENCOUNTER — Other Ambulatory Visit: Payer: Self-pay

## 2020-08-13 ENCOUNTER — Inpatient Hospital Stay: Payer: Self-pay

## 2020-08-13 DIAGNOSIS — K56601 Complete intestinal obstruction, unspecified as to cause: Secondary | ICD-10-CM | POA: Diagnosis present

## 2020-08-13 DIAGNOSIS — E1165 Type 2 diabetes mellitus with hyperglycemia: Secondary | ICD-10-CM | POA: Diagnosis present

## 2020-08-13 DIAGNOSIS — E669 Obesity, unspecified: Secondary | ICD-10-CM

## 2020-08-13 DIAGNOSIS — R1900 Intra-abdominal and pelvic swelling, mass and lump, unspecified site: Secondary | ICD-10-CM

## 2020-08-13 DIAGNOSIS — G9340 Encephalopathy, unspecified: Secondary | ICD-10-CM | POA: Diagnosis not present

## 2020-08-13 DIAGNOSIS — K529 Noninfective gastroenteritis and colitis, unspecified: Secondary | ICD-10-CM | POA: Diagnosis not present

## 2020-08-13 DIAGNOSIS — K219 Gastro-esophageal reflux disease without esophagitis: Secondary | ICD-10-CM | POA: Diagnosis not present

## 2020-08-13 DIAGNOSIS — E785 Hyperlipidemia, unspecified: Secondary | ICD-10-CM

## 2020-08-13 DIAGNOSIS — J449 Chronic obstructive pulmonary disease, unspecified: Secondary | ICD-10-CM | POA: Diagnosis present

## 2020-08-13 DIAGNOSIS — K6389 Other specified diseases of intestine: Secondary | ICD-10-CM | POA: Diagnosis not present

## 2020-08-13 DIAGNOSIS — R531 Weakness: Secondary | ICD-10-CM

## 2020-08-13 DIAGNOSIS — E440 Moderate protein-calorie malnutrition: Secondary | ICD-10-CM | POA: Diagnosis not present

## 2020-08-13 DIAGNOSIS — E1122 Type 2 diabetes mellitus with diabetic chronic kidney disease: Secondary | ICD-10-CM

## 2020-08-13 DIAGNOSIS — E8809 Other disorders of plasma-protein metabolism, not elsewhere classified: Secondary | ICD-10-CM

## 2020-08-13 DIAGNOSIS — R739 Hyperglycemia, unspecified: Secondary | ICD-10-CM

## 2020-08-13 DIAGNOSIS — E876 Hypokalemia: Secondary | ICD-10-CM | POA: Diagnosis present

## 2020-08-13 DIAGNOSIS — D631 Anemia in chronic kidney disease: Secondary | ICD-10-CM | POA: Diagnosis present

## 2020-08-13 DIAGNOSIS — E1140 Type 2 diabetes mellitus with diabetic neuropathy, unspecified: Secondary | ICD-10-CM | POA: Diagnosis present

## 2020-08-13 DIAGNOSIS — D509 Iron deficiency anemia, unspecified: Secondary | ICD-10-CM | POA: Diagnosis not present

## 2020-08-13 DIAGNOSIS — D490 Neoplasm of unspecified behavior of digestive system: Secondary | ICD-10-CM | POA: Diagnosis not present

## 2020-08-13 DIAGNOSIS — K922 Gastrointestinal hemorrhage, unspecified: Secondary | ICD-10-CM | POA: Diagnosis not present

## 2020-08-13 DIAGNOSIS — I10 Essential (primary) hypertension: Secondary | ICD-10-CM | POA: Diagnosis not present

## 2020-08-13 DIAGNOSIS — G4733 Obstructive sleep apnea (adult) (pediatric): Secondary | ICD-10-CM | POA: Diagnosis not present

## 2020-08-13 DIAGNOSIS — K5090 Crohn's disease, unspecified, without complications: Secondary | ICD-10-CM | POA: Diagnosis not present

## 2020-08-13 DIAGNOSIS — F419 Anxiety disorder, unspecified: Secondary | ICD-10-CM | POA: Diagnosis present

## 2020-08-13 DIAGNOSIS — Z8719 Personal history of other diseases of the digestive system: Secondary | ICD-10-CM | POA: Insufficient documentation

## 2020-08-13 DIAGNOSIS — D62 Acute posthemorrhagic anemia: Secondary | ICD-10-CM | POA: Diagnosis present

## 2020-08-13 DIAGNOSIS — N183 Chronic kidney disease, stage 3 unspecified: Secondary | ICD-10-CM

## 2020-08-13 DIAGNOSIS — D500 Iron deficiency anemia secondary to blood loss (chronic): Secondary | ICD-10-CM | POA: Diagnosis not present

## 2020-08-13 DIAGNOSIS — Z794 Long term (current) use of insulin: Secondary | ICD-10-CM

## 2020-08-13 DIAGNOSIS — R4182 Altered mental status, unspecified: Secondary | ICD-10-CM

## 2020-08-13 DIAGNOSIS — R933 Abnormal findings on diagnostic imaging of other parts of digestive tract: Secondary | ICD-10-CM | POA: Diagnosis not present

## 2020-08-13 DIAGNOSIS — N1832 Chronic kidney disease, stage 3b: Secondary | ICD-10-CM | POA: Diagnosis present

## 2020-08-13 DIAGNOSIS — K56691 Other complete intestinal obstruction: Secondary | ICD-10-CM | POA: Diagnosis not present

## 2020-08-13 DIAGNOSIS — K5732 Diverticulitis of large intestine without perforation or abscess without bleeding: Secondary | ICD-10-CM | POA: Diagnosis not present

## 2020-08-13 DIAGNOSIS — E079 Disorder of thyroid, unspecified: Secondary | ICD-10-CM | POA: Diagnosis present

## 2020-08-13 DIAGNOSIS — Z20822 Contact with and (suspected) exposure to covid-19: Secondary | ICD-10-CM | POA: Diagnosis not present

## 2020-08-13 DIAGNOSIS — I129 Hypertensive chronic kidney disease with stage 1 through stage 4 chronic kidney disease, or unspecified chronic kidney disease: Secondary | ICD-10-CM | POA: Diagnosis present

## 2020-08-13 DIAGNOSIS — G7000 Myasthenia gravis without (acute) exacerbation: Secondary | ICD-10-CM | POA: Diagnosis present

## 2020-08-13 LAB — COMPREHENSIVE METABOLIC PANEL
ALT: 21 U/L (ref 0–44)
AST: 16 U/L (ref 15–41)
Albumin: 2.4 g/dL — ABNORMAL LOW (ref 3.5–5.0)
Alkaline Phosphatase: 104 U/L (ref 38–126)
Anion gap: 9 (ref 5–15)
BUN: 11 mg/dL (ref 8–23)
CO2: 24 mmol/L (ref 22–32)
Calcium: 8.4 mg/dL — ABNORMAL LOW (ref 8.9–10.3)
Chloride: 106 mmol/L (ref 98–111)
Creatinine, Ser: 0.75 mg/dL (ref 0.44–1.00)
GFR calc Af Amer: >60 mL/min (ref >60)
GFR calc non Af Amer: >60 mL/min (ref >60)
Glucose, Bld: 99 mg/dL (ref 70–99)
Potassium: 3.5 mmol/L (ref 3.5–5.1)
Sodium: 139 mmol/L (ref 135–145)
Total Bilirubin: 0.3 mg/dL (ref 0.3–1.2)
Total Protein: 7.1 g/dL (ref 6.5–8.1)

## 2020-08-13 LAB — POC OCCULT BLOOD, ED: Fecal Occult Bld: POSITIVE — AB

## 2020-08-13 LAB — CBC
HCT: 30.8 % — ABNORMAL LOW (ref 36.0–46.0)
Hemoglobin: 9.1 g/dL — ABNORMAL LOW (ref 12.0–15.0)
MCH: 27.8 pg (ref 26.0–34.0)
MCHC: 29.5 g/dL — ABNORMAL LOW (ref 30.0–36.0)
MCV: 94.2 fL (ref 80.0–100.0)
Platelets: 368 10*3/uL (ref 150–400)
RBC: 3.27 MIL/uL — ABNORMAL LOW (ref 3.87–5.11)
RDW: 18 % — ABNORMAL HIGH (ref 11.5–15.5)
WBC: 9.8 10*3/uL (ref 4.0–10.5)
nRBC: 0 % (ref 0.0–0.2)

## 2020-08-13 LAB — PROTIME-INR
INR: 1.3 — ABNORMAL HIGH (ref 0.8–1.2)
INR: 1.4 — ABNORMAL HIGH (ref 0.8–1.2)
Prothrombin Time: 15.7 seconds — ABNORMAL HIGH (ref 11.4–15.2)
Prothrombin Time: 16.1 seconds — ABNORMAL HIGH (ref 11.4–15.2)

## 2020-08-13 LAB — LACTIC ACID, PLASMA
Lactic Acid, Venous: 1.7 mmol/L (ref 0.5–1.9)
Lactic Acid, Venous: 1.7 mmol/L (ref 0.5–1.9)

## 2020-08-13 LAB — PHOSPHORUS: Phosphorus: 3.2 mg/dL (ref 2.5–4.6)

## 2020-08-13 LAB — PROCALCITONIN: Procalcitonin: 3.42 ng/mL

## 2020-08-13 LAB — GLUCOSE, CAPILLARY
Glucose-Capillary: 53 mg/dL — ABNORMAL LOW (ref 70–99)
Glucose-Capillary: 87 mg/dL (ref 70–99)
Glucose-Capillary: 77 mg/dL (ref 70–99)
Glucose-Capillary: 89 mg/dL (ref 70–99)

## 2020-08-13 LAB — MAGNESIUM: Magnesium: 1.7 mg/dL (ref 1.7–2.4)

## 2020-08-13 LAB — APTT: aPTT: 26 seconds (ref 24–36)

## 2020-08-13 MED ORDER — CHLORHEXIDINE GLUCONATE CLOTH 2 % EX PADS
6.0000 | MEDICATED_PAD | Freq: Every day | CUTANEOUS | Status: DC
  Administered 2020-08-13 – 2020-08-17 (×4): 6 via TOPICAL

## 2020-08-13 MED ORDER — SODIUM CHLORIDE 0.9 % IV SOLN
8.0000 mg/h | INTRAVENOUS | Status: DC
  Administered 2020-08-13 – 2020-08-15 (×5): 8 mg/h via INTRAVENOUS
  Filled 2020-08-13 (×8): qty 80

## 2020-08-13 MED ORDER — SODIUM CHLORIDE 0.9% FLUSH: 10.0000 mL | INTRAVENOUS | Status: DC | PRN

## 2020-08-13 MED ORDER — SODIUM CHLORIDE 0.9 % IV SOLN
80.0000 mg | Freq: Once | INTRAVENOUS | Status: AC
  Administered 2020-08-13: 80 mg via INTRAVENOUS
  Filled 2020-08-13: qty 80

## 2020-08-13 MED ORDER — PANTOPRAZOLE SODIUM 40 MG IV SOLR: 40.0000 mg | Freq: Two times a day (BID) | INTRAVENOUS | Status: DC

## 2020-08-13 MED ORDER — PIPERACILLIN-TAZOBACTAM 3.375 G IVPB
3.3750 g | Freq: Three times a day (TID) | INTRAVENOUS | Status: DC
  Administered 2020-08-13 – 2020-08-18 (×15): 3.375 g via INTRAVENOUS
  Filled 2020-08-13 (×16): qty 50

## 2020-08-13 MED ORDER — SODIUM CHLORIDE 0.9% FLUSH
10.0000 mL | Freq: Two times a day (BID) | INTRAVENOUS | Status: DC
  Administered 2020-08-13 – 2020-08-14 (×4): 10 mL
  Administered 2020-08-15: 20 mL
  Administered 2020-08-15 – 2020-08-17 (×4): 10 mL

## 2020-08-13 NOTE — Progress Notes (Signed)
Peripherally Inserted Central Catheter Placement  The IV Nurse has discussed with the patient and/or persons authorized to consent for the patient, the purpose of this procedure and the potential benefits and risks involved with this procedure.  The benefits include less needle sticks, lab draws from the catheter, and the patient may be discharged home with the catheter. Risks include, but not limited to, infection, bleeding, blood clot (thrombus formation), and puncture of an artery; nerve damage and irregular heartbeat and possibility to perform a PICC exchange if needed/ordered by physician.  Alternatives to this procedure were also discussed.  Bard Power PICC patient education guide, fact sheet on infection prevention and patient information card has been provided to patient /or left at bedside.    PICC Placement Documentation  PICC Double Lumen 25/05/39 PICC Right Basilic 38 cm 0 cm (Active)  Indication for Insertion or Continuance of Line Limited venous access - need for IV therapy >5 days (PICC only) 08/13/20 1325  Exposed Catheter (cm) 0 cm 08/13/20 1325  Site Assessment Clean;Dry;Intact 08/13/20 1325  Lumen #1 Status Blood return noted;Flushed;Saline locked 08/13/20 1325  Lumen #2 Status Blood return noted;Flushed;Saline locked 08/13/20 1325  Dressing Type Transparent 08/13/20 1325  Dressing Status Clean;Dry;Intact;Antimicrobial disc in place 08/13/20 1325  Safety Lock Not Applicable 76/73/41 [id number]  Dressing Change Due 08/20/20 08/13/20 1325       Jeanice Lim M 08/13/2020, 1:47 PM

## 2020-08-13 NOTE — H&P (Signed)
History and Physical  [name] XBJ:[id number] DOB: 1941-06-09 DOA: 08/12/2020  Referring physician: Wyvonnia Dusky, MD PCP: Fayrene Helper, MD  Patient coming from: Home  Chief Complaint: Altered mental status  HPI: [name] is a 79 y.o. 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  Patient was unable to provide history due to being somnolent, though easily aroused, history was obtained from daughter by phone and ED physician and ED chart.  Per report, patient was discharged home from a nursing facility 4 days ago, she lives alone (at baseline, she is able to take care of most of her ADLs and maintain normal conversation and daughter checks on her regularly), but patient has been feeling weak since past 3 days, she has had poor appetite and has been more somnolent and drowsy at home.  Daughter activated EMS and she was sent to the ED for further evaluation and management.  Patient was seen by her PCP on 8/12 during which she complained of ongoing rectal bleeding (patient has a pending GI follow-up).  She was admitted to this facility from 7/4-7/8 due to acute blood loss anemia and recurrent C. difficile colitis.  ED Course:  In the emergency department, temperature was 98.40F on arrival, RR 18/minute pulse 63 bpm BP 132/54, O2 sat 95%.  Work-up in the ED showed thrombocytopenia, hypernatremia, hyperglycemia, troponin x 2 10>9, alcohol level < 10, ammonia 28, drug screen was negative.  Urinalysis was negative for UTI, iron 14.  SARS coronavirus was negative.  CT abdomen pelvis with contrast showed distal colonic diverticulosis with long segment circumferential thickening of the descending and sigmoid colon. Overall similar or slightly progression of  inflammatory changes compared to the prior CT. Findings may be inflammatory/infectious in etiology.  Chest x-ray showed cardiomegaly with no active disease.  CT without contrast showed no acute intracranial abnormality.  Patient was empirically treated with IV Zosyn.  Hospitalist was asked to admit patient for further evaluation and management.  Review of Systems: This cannot be obtained at this time due to patient being somnolent  Past Medical History:  Diagnosis Date  . Anemia in chronic renal disease 12/30/2015  . Anxiety   . Arthritis    "all over" (01/19/2014)  . Arthritis, lumbar spine   . Carpal tunnel syndrome   . Chronic bronchitis ([place])    "got it q yr for awhile; hasn't had it in awhile" (01/19/2014)  . Chronic kidney disease (CKD) stage G3b/A1, moderately decreased glomerular filtration rate (GFR) between 30-44 mL/min/1.73 square meter and albuminuria creatinine ratio less than 30 mg/g 09/14/2009   Qualifier: Diagnosis of  By: Moshe Cipro MD, Joycelyn Schmid    . Complication of anesthesia    combative  . COPD (chronic obstructive pulmonary disease) ([place])   . Depression   . Diverticulosis 07/2004   Colonscopy Dr Gala Romney  . Esophagitis, erosive 2009  . Exertional shortness of breath   . Gastroesophageal reflux   . HYQMVHQI(696.2)    "usually a couple times/wk" (01/19/2014)  . Heart murmur    saw cardiology In [place], he told her she did not need to come back.  . Hyperlipidemia   . Hypertension   . Impingement syndrome, shoulder   . Iron deficiency anemia   . Mitral regurgitation   . Myasthenia gravis   . Myasthenia gravis in remission ([place])  11/24/2014  . Obesity   . OSA on CPAP    Negative on last sleep study  . Pneumonia 01/2012  . Pulmonary HTN ([place])   . Schatzki's ring    Last EGD w/ dilation 02/08/11, 2009 & 2007  . Seasonal allergies   . Shoulder pain   . Thyroid disease    "used to take RX; they took me off it" (01/19/2014)  . Tobacco abuse   . Type II diabetes mellitus  ([place])    Past Surgical History:  Procedure Laterality Date  . [place] VITRECTOMY WITH 20 GAUGE MVR PORT Left 01/19/2014   Procedure: 25 GAUGE PARS PLANA VITRECTOMY WITH 20 GAUGE MVR PORT; MEMBRAME PEEL; SERUM PATCH; LASER TREATMENT; C3F8;  Surgeon: Hayden Pedro, MD;  Location: [facility];  Service: Ophthalmology;  Laterality: Left;  . ABDOMINAL HYSTERECTOMY    . AGILE CAPSULE N/A 08/05/2019   Procedure: AGILE CAPSULE;  Surgeon: Daneil Dolin, MD;  [facility];  Service: Endoscopy;  Laterality: N/A;  7:30am  . APPENDECTOMY    . CARPAL TUNNEL RELEASE Bilateral   . CATARACT EXTRACTION W/PHACO Left 10/21/2013   Procedure: LEFT CATARACT EXTRACTION PHACO AND INTRAOCULAR LENS PLACEMENT (IOC);  Surgeon: Marylynn Pearson, MD;  Location: [facility];  Service: Ophthalmology;  Laterality: Left;  . CHOLECYSTECTOMY    . COLONOSCOPY  05/08/2012   VQQ:VZDGLOVF and external hemorrhoids; colonic diverticulosis  . COLONOSCOPY WITH PROPOFOL N/A 02/26/2019   two simple adenomas, diverticulosis,  and internal hemorrhoids.   . ESOPHAGEAL DILATION     "more than 3 times" (01/19/2014)  . ESOPHAGOGASTRODUODENOSCOPY  02/08/11   [place]-Distal esophageal erosion consistent with mild erosive reflux   esophagitis/ Noncritical Schatzki ring, small hiatal hernia otherwise upper/ gastrointestinal tract appeared unremarkable, status post passage  of a Maloney dilation to biopsy disruption of the ring described  . ESOPHAGOGASTRODUODENOSCOPY  04/2012   2 tandem incomplete distal esophagea rings s/p dilation.   . ESOPHAGOGASTRODUODENOSCOPY N/A 09/16/2014   Dr. Gala Romney: Schatzki ring status post dilation/disruption.  Hiatal hernia.  Marland Kitchen ESOPHAGOGASTRODUODENOSCOPY (EGD) WITH PROPOFOL N/A 12/08/2018   EGD with mild Schatzki ring s/p dilation, small hiatal hernia, otherwise normal  . EYE SURGERY     cataracts, bilateral  . GIVENS CAPSULE STUDY N/A 09/29/2019   Procedure: GIVENS CAPSULE STUDY;  Surgeon: Daneil Dolin, MD;   [facility];  Service: Endoscopy;  Laterality: N/A;  7:30am  . INCONTINENCE SURGERY  08/26/09   [place]  . JOINT REPLACEMENT     right total knee  . MALONEY DILATION N/A 09/16/2014   Procedure: Venia Minks DILATION;  Surgeon: Daneil Dolin, MD;  [facility];  Service: Endoscopy;  Laterality: N/A;  . Venia Minks DILATION N/A 12/08/2018   Procedure: Venia Minks DILATION;  Surgeon: Daneil Dolin, MD;  [facility];  Service: Endoscopy;  Laterality: N/A;  . PARS PLANA VITRECTOMY W/ REPAIR OF MACULAR HOLE Left 01/19/2014  . POLYPECTOMY  02/26/2019   Procedure: POLYPECTOMY;  Surgeon: Danie Binder, MD;  [facility];  Service: Endoscopy;;  ascending colon polyps x2  . SAVORY DILATION N/A 09/16/2014   Procedure: SAVORY DILATION;  Surgeon: Daneil Dolin, MD;  [facility];  Service: Endoscopy;  Laterality: N/A;  . TONSILLECTOMY    . TOTAL KNEE ARTHROPLASTY Right 05/13/07   Dr. Aline Brochure  . TRIGGER FINGER RELEASE Right 06/26/2018   Procedure: RIGHT INDEX FINGER TRIGGER FINGER/A-1 PULLEY RELEASE;  Surgeon: Carole Civil, MD;  Location[facility];  Service: Orthopedics;  Laterality: Right;    Social History:  reports that she quit smoking about 16 years ago. Her smoking use included cigarettes. She has a 12.50 pack-year smoking history. She has never used smokeless tobacco. She reports that she does not drink alcohol and does not use drugs.   No Known Allergies  Family History  Problem Relation Age of Onset  . Cancer Mother   . Heart disease Mother   . Cancer Father   . Heart disease Father   . Diabetes Sister   . Hypertension Brother   . Heart disease Brother   . Cancer Sister   . Kidney failure Sister   . Diabetes Son   . Hypertension Son   . Hypertension Daughter   . Colon cancer Neg Hx     Prior to Admission medications   Medication Sig Start Date End Date Taking? Authorizing Provider  acetaminophen (TYLENOL) 325 MG tablet Take 2  tablets (650 mg total) by mouth every 6 (six) hours as needed for mild pain, fever or headache (or Fever >/= 101). 06/20/20   Emokpae, Courage, MD  albuterol (PROAIR HFA) 108 (90 Base) MCG/ACT inhaler Inhale 2 puffs into the lungs every 6 (six) hours as needed. 08/08/20   Gerlene Fee, NP  B-D ULTRAFINE III SHORT PEN 31G X 8 MM MISC USE TO INJECT INSULIN 08/10/20   Fayrene Helper, MD  brimonidine-timolol (COMBIGAN) 0.2-0.5 % ophthalmic solution Place 1 drop into both eyes every 12 (twelve) hours. 08/08/20   Gerlene Fee, NP  cetirizine (ZYRTEC) 10 MG tablet TAKE 1 TABLET BY MOUTH ONCE A DAY. 05/17/20   Fayrene Helper, MD  Cholecalciferol (VITAMIN D3) 2000 units TABS Take 2,000 Units by mouth at bedtime.    [provider]  cholestyramine Lucrezia Starch) 4 GM/DOSE powder Take 1 packet (4 g total) by mouth 2 (two) times daily with a meal. For stool bulking 08/08/20   Gerlene Fee, NP  citalopram (CELEXA) 10 MG tablet Take 1 tablet (10 mg total) by mouth daily. 08/08/20   Gerlene Fee, NP  Cranberry 1000 MG CAPS Take 1,000 mg by mouth daily.    [provider]  cycloSPORINE (RESTASIS) 0.05 % ophthalmic emulsion Place 1 drop into both eyes 2 (two) times daily. 08/08/20   Gerlene Fee, NP  diltiazem (CARDIZEM CD) 120 MG 24 hr capsule Take 1 capsule (120 mg total) by mouth daily. 08/08/20   Gerlene Fee, NP  ezetimibe (ZETIA) 10 MG tablet Take 1 tablet (10 mg total) by mouth daily. 08/08/20   Gerlene Fee, NP  famotidine (PEPCID) 40 MG tablet Take 1 tablet (40 mg total) by mouth every evening. 08/08/20   Gerlene Fee, NP  fluticasone (FLONASE) 50 MCG/ACT nasal spray Place 2 sprays into both nostrils daily. 08/08/20   Gerlene Fee, NP  gabapentin (NEURONTIN) 300 MG capsule TAKE (1) CAPSULE BY MOUTH TWICE DAILY. 08/08/20   Gerlene Fee, NP  hydrocortisone (ANUSOL-HC) 2.5 % rectal cream Place 1 application rectally 2 (two) times daily. 07/01/20   Erenest Rasher, PA-C    insulin glargine (LANTUS SOLOSTAR) 100 UNIT/ML Solostar Pen Inject 6 Units into the skin at bedtime. 08/08/20   Gerlene Fee, NP  insulin lispro (HUMALOG KWIKPEN) 100 UNIT/ML KwikPen Inject 0.03 mLs (3 Units total) into the skin 3 (three) times daily. insulin pen; 100 unit/mL; amt: Per Sliding Scale; If Blood Sugar is 0 to 149, give  0 Units. If Blood Sugar is greater than 149, give 3 Units. subcutaneous Before Meals 08/08/20   Gerlene Fee, NP  linagliptin (TRADJENTA) 5 MG TABS tablet Take 1 tablet (5 mg total) by mouth daily. 08/08/20   Gerlene Fee, NP  losartan (COZAAR) 25 MG tablet Take 1 tablet (25 mg total) by mouth daily. 08/08/20   Gerlene Fee, NP  Multiple Vitamin (MULTIVITAMIN WITH MINERALS) TABS tablet Take 1 tablet by mouth at bedtime.    [provider]  nystatin (NYSTATIN) powder Apply 1 application topically daily as needed. Special Instructions: fungal rash to groin and inguinal folds 06/20/20   [provider]  ondansetron (ZOFRAN) 4 MG tablet Take 1 tablet (4 mg total) by mouth every 8 (eight) hours as needed for nausea or vomiting. 07/01/20   Erenest Rasher, PA-C  predniSONE (DELTASONE) 5 MG tablet Take 1 tablet (5 mg total) by mouth daily. 08/08/20   Gerlene Fee, NP  Probiotic Product (RISA-BID PROBIOTIC PO) Take 1 tablet by mouth in the morning and at bedtime.    [provider]  pyridostigmine (MESTINON) 60 MG tablet Take three times daily 08/08/20   Gerlene Fee, NP  rosuvastatin (CRESTOR) 40 MG tablet TAKE (1) TABLET BY MOUTH AT BEDTIME. 08/08/20   Gerlene Fee, NP  solifenacin (VESICARE) 10 MG tablet Take 1 tablet (10 mg total) by mouth daily. 08/08/20   Gerlene Fee, NP  venlafaxine XR (EFFEXOR-XR) 150 MG 24 hr capsule Take 1 capsule (150 mg total) by mouth daily. 08/08/20   Gerlene Fee, NP    Physical Exam: BP (!) 175/68   Pulse 64   Temp (!) 97.1 F (36.2 C) (Rectal)   Resp 18   SpO2 94%   . General: 79 y.o. year-old  female ill-appearing but in no acute distress.   Marland Kitchen HEENT: Normocephalic, atraumatic . Neck: Supple, trachea medial . Cardiovascular: Regular rate and rhythm with no rubs or gallops.  No thyromegaly or JVD noted.  No lower extremity edema. 2/4 pulses in all 4 extremities. Marland Kitchen Respiratory: Clear to auscultation with no wheezes or rales. Good inspiratory effort. . Abdomen: Soft, mild tenderness to palpation without guarding. Normal bowel sounds x4 quadrants. . Muskuloskeletal: No cyanosis, clubbing or edema noted bilaterally . Neuro: CN II-XII intact, strength, sensation, reflexes . Skin: No ulcerative lesions noted or rashes . Psychiatry: Judgement and insight appear normal. Mood is appropriate for condition and setting          Labs on Admission:  Basic Metabolic Panel: Recent Labs  Lab 08/11/20 1335 08/12/20 1732  NA 138 136  K 3.6 3.8  CL 108 106  CO2 22 23  GLUCOSE 149* 178*  BUN 15 12  CREATININE 0.89 0.94  CALCIUM 8.5* 8.5*   Liver Function Tests: Recent Labs  Lab 08/12/20 1732  AST 19  ALT 19  ALKPHOS 94  BILITOT 0.4  PROT 6.4*  ALBUMIN 2.2*   No results for input(s): LIPASE, AMYLASE in the last 168 hours. Recent Labs  Lab 08/12/20 1732  AMMONIA 28   CBC: Recent Labs  Lab 08/11/20 1335 08/12/20 1732  WBC 7.9 16.2*  NEUTROABS  --  13.9*  HGB 8.1* 8.4*  HCT 27.6* 27.8*  MCV 94.8 92.4  PLT 363 369   Cardiac Enzymes: No results for input(s): CKTOTAL, CKMB, CKMBINDEX, TROPONINI in the last 168 hours.  BNP (last 3 results) No results for input(s): BNP in the last 8760 hours.  ProBNP (last 3 results) No results for input(s): PROBNP in the last 8760 hours.  CBG: Recent Labs  Lab 08/12/20 1728  GLUCAP 173*    Radiological Exams on Admission: CT Head Wo Contrast  Result Date: 08/12/2020 CLINICAL DATA:  Mental status change, unknown cause. Additional history provided: Confusion. EXAM: CT HEAD WITHOUT CONTRAST TECHNIQUE: Contiguous axial images were  obtained from the base of the skull through the vertex without intravenous contrast. COMPARISON:  Brain MRI 05/18/2013 FINDINGS: Brain: Mild generalized parenchymal atrophy. Mild ill-defined hypoattenuation within the cerebral white matter is nonspecific, but consistent with chronic small vessel ischemic disease. Findings are similar as compared to the MRI of 05/18/2013. There is no acute intracranial hemorrhage. No demarcated cortical infarct is identified. No extra-axial fluid collection. No evidence of intracranial mass. No midline shift. Partially empty sella turcica. Vascular: No hyperdense vessel.  Atherosclerotic calcifications. Skull: Normal. Negative for fracture or focal lesion. Sinuses/Orbits: Visualized orbits show no acute finding. Sphenoid sinus mucosal thickening, moderate on the right, mild on the left). Bilateral mastoid effusions. IMPRESSION: No CT evidence of acute intracranial abnormality. Mild generalized parenchymal atrophy and chronic small vessel ischemic disease, without appreciable change as compared to the MRI of 05/18/2013. Sphenoid sinusitis. Bilateral mastoid effusions. Electronically Signed   By: Kellie Simmering DO   On: 08/12/2020 18:07   CT ABDOMEN PELVIS W CONTRAST  Result Date: 08/12/2020 CLINICAL DATA:  79 year old female with abdominal pain and fever. EXAM: CT ABDOMEN AND PELVIS WITH CONTRAST TECHNIQUE: Multidetector CT imaging of the abdomen and pelvis was performed using the standard protocol following bolus administration of intravenous contrast. CONTRAST:  117mL OMNIPAQUE IOHEXOL 300 MG/ML  SOLN COMPARISON:  CT abdomen pelvis dated 06/12/2020. FINDINGS: Lower chest: There are bibasilar dependent atelectatic changes. There is mild cardiomegaly. Three vessel coronary vascular calcification. There is no intra-abdominal free air or free fluid. Hepatobiliary: Probable mild fatty liver. No intrahepatic biliary ductal dilatation. Cholecystectomy. No retained calcified stone noted  in the central CBD. Pancreas: Unremarkable. No pancreatic ductal dilatation or surrounding inflammatory changes. Spleen: Normal in size without focal abnormality. Adrenals/Urinary Tract: The adrenal glands unremarkable. There is mild bilateral renal parenchyma atrophy and cortical irregularity and scarring. Small bilateral renal cysts and subcentimeter hypodensities which are too small to characterize. There is no hydronephrosis on either side. There is symmetric enhancement and excretion of contrast by both kidneys. The visualized ureters and urinary bladder appear unremarkable. Stomach/Bowel: There is diffuse diverticulosis of the descending and sigmoid colon. There is long segment circumferential thickening of the descending and sigmoid colon with stranding of the adjacent fat which may be inflammatory/infectious in etiology. A colonic mass is not excluded. Overall similar or slight progression of the inflammatory changes compared to the prior CT. Multiple scattered rounded lymph nodes again seen along this segment of the colon as seen on the prior CT. There is a small hiatal hernia. Moderate stool noted throughout the colon. No bowel obstruction. Appendectomy. Vascular/Lymphatic: Moderate aortoiliac atherosclerotic disease. The IVC is unremarkable. No portal venous gas. There is no adenopathy. Reproductive: Hysterectomy. There is a 2.5 cm right adnexal cyst. Other: None Musculoskeletal: Degenerative changes of the spine. No acute osseous pathology. IMPRESSION: 1. Distal colonic diverticulosis with long segment circumferential thickening of the descending and sigmoid colon. Overall similar or slightly progression of inflammatory changes compared to the prior CT. Findings may the inflammatory/infectious in etiology. Underlying colonic mass is not excluded. Clinical correlation and further evaluation with colonoscopy on a nonemergent/outpatient basis and following resolution of acute  symptoms recommended. 2. A 2.5  cm right adnexal cyst. Further evaluation with pelvic ultrasound on a nonemergent basis recommended. 3. Aortic Atherosclerosis (ICD10-I70.0). Electronically Signed   By: Anner Crete M.D.   On: 08/12/2020 23:33   DG Chest Portable 1 View  Result Date: 08/12/2020 CLINICAL DATA:  Infection workup EXAM: PORTABLE CHEST 1 VIEW COMPARISON:  04/29/2020 FINDINGS: Cardiomegaly. No confluent airspace opacities, effusions or edema. No acute bony abnormality. IMPRESSION: Cardiomegaly.  No active disease. Electronically Signed   By: Rolm Baptise M.D.   On: 08/12/2020 18:28    EKG: I independently viewed the EKG done and my findings are as followed: Normal sinus rhythm at rate of 65 bpm  Assessment/Plan Present on Admission: . Altered mental status . Hyperlipemia . IDA (iron deficiency anemia) . GERD . Type 2 diabetes mellitus with diabetic neuropathy, unspecified ([place]) . Pelvic mass in female . GI bleed . Acute colitis  Principal Problem:   Altered mental status Active Problems:   Type 2 diabetes mellitus (H[facility])   Hyperlipemia   Obesity (BMI 30.0-34.9)   IDA (iron deficiency anemia)   GERD   Acute colitis   Type 2 diabetes mellitus with diabetic neuropathy, unspecified ([facility])   Pelvic mass in female   GI bleed   Hyperglycemia   Generalized weakness   Hypoalbuminemia   Altered mental status possible secondary to multifactorial Patient presents with altered mental status, she was somnolent though easily aroused Ammonia level, alcohol level were normal Lactic acid 1.7 Patient be placed n.p.o. at this time Continue fall precaution and neurochecks Continue PT/OT eval and treat  Acute colitis CT abdomen and pelvis with contrast showed distal colonic diverticulosis with long segment circumferential thickening of the descending and sigmoid colon. Overall similar or slightly progression of inflammatory changes compared to the prior CT. Findings may the inflammatory/infectious in  etiology Patient was recently treated for C. difficile colitis. It is not certain at this time if this is related to prior C. difficile, however, daughter has not noted her having frequent diarrhea since being discharged from the nursing facility. Procalcitonin was 3.42; patient was empirically started on IV Zosyn, we shall continue with same at this time  Generalized weakness in the setting of GI bleed, iron deficiency and possibly due to acute colitis H/H= 8.4, this was 9.5 about 3 weeks ago Hemoccult was positive She recently had acute blood loss anemia that required transfusion of 2 units of PRBC.  She has a history of known AVMs and diverticulosis Continue IV Protonix drip Gastroenterologist will be consulted  Iron deficiency anemia This is possibly due to GI bleed Iron level was 14, ferritin was normal Consider starting patient on iron tablets when she resumes p.o. intake Patient may require outpatient follow-up with hematologist on discharge  GERD Continue Protonix as indicated above  Hyperglycemia secondary to type 2 diabetes mellitus Continue/sliding scale and hypoglycemia protocol  Hypoalbuminemia possibly secondary to moderate protein calorie malnutrition Albumin 2.2 Protein supplement will be provided once patient is able to tolerate p.o. intake  Obesity (BMI 36.81)  Patient will be counseled on diet and exercise when more stable  DVT prophylaxis: SCDs  Code Status: Full code  Family Communication: Daughter by phone (all questions answered to satisfaction)  Disposition Plan:  Patient is from:                        home Anticipated DC to:  SNF or family members home Anticipated DC date:               2-3 days Anticipated DC barriers:           Patient cannot be discharged at this time due to altered mental status, GI bleed (pending gastroenterology consult) and generalized weakness due to multifactorial   Consults called:  Gastroenterology  Admission status: Inpatient   Bernadette Hoit MD [facility]  If 7PM-7AM, please contact night-coverage [email or web address] Password [facility]  08/13/2020, 3:36 AM

## 2020-08-13 NOTE — ED Notes (Signed)
Pt bed linens and gown changed. Pt placed on purewick and brief placed on pt.

## 2020-08-13 NOTE — ED Provider Notes (Signed)
Asked by nursing staff to place IV access.  Multiple nurses have tried unsuccessfully with ultrasound.  Patient has left external jugular vein that is patent as well as internal jugular vein that could be easily accessed.  She adamantly refuses any kind of neck venipuncture.  She is also refusing to let me look at her arms. She is alert and oriented x3 and appears to have capacity to refuse this decision at this time.   Ezequiel Essex, MD 08/13/20 [phone]

## 2020-08-13 NOTE — ED Notes (Signed)
Pt's IV infiltrated. Several attempts made but other staff to place IV via [place] but were unsuccessful. Dr. Wyvonnia Dusky @ bedside to attempt IJ/EJ but pt refused. Dr. Lynnell Jude notified of loss of IV access and inability to obtain new one. No new orders given.

## 2020-08-13 NOTE — Progress Notes (Signed)
Per HPI: [name] is a 79 y.o. 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  Patient was unable to provide history due to being somnolent, though easily aroused, history was obtained from daughter by phone and ED physician and ED chart.  Per report, patient was discharged home from a nursing facility 4 days ago, she lives alone (at baseline, she is able to take care of most of her ADLs and maintain normal conversation and daughter checks on her regularly), but patient has been feeling weak since past 3 days, she has had poor appetite and has been more somnolent and drowsy at home.  Daughter activated EMS and she was sent to the ED for further evaluation and management.  Patient was seen by her PCP on 8/12 during which she complained of ongoing rectal bleeding (patient has a pending GI follow-up).  She was admitted to this facility from 7/4-7/8 due to acute blood loss anemia and recurrent C. difficile colitis.  -Patient was admitted with acute encephalopathy of unknown origin and thus far, negative work-up.  She is also noted to have acute colitis/diverticulitis.  Additionally she is noted to have very mild blood loss anemia with positive Hemoccult.  GI consulted for evaluation.  She was ordered to start IV Protonix as well as Zosyn, but unfortunately IV access has not been obtained.  PICC team to address for IV access.  We will start clear liquid diet for now.  GI consultation pending.  Discussed with daughter on phone 8/14.  Total care time: 30 minutes.

## 2020-08-14 LAB — COMPREHENSIVE METABOLIC PANEL
ALT: 16 U/L (ref 0–44)
AST: 16 U/L (ref 15–41)
Albumin: 1.9 g/dL — ABNORMAL LOW (ref 3.5–5.0)
Alkaline Phosphatase: 91 U/L (ref 38–126)
Anion gap: 10 (ref 5–15)
BUN: 8 mg/dL (ref 8–23)
CO2: 25 mmol/L (ref 22–32)
Calcium: 7.7 mg/dL — ABNORMAL LOW (ref 8.9–10.3)
Chloride: 100 mmol/L (ref 98–111)
Creatinine, Ser: 0.71 mg/dL (ref 0.44–1.00)
GFR calc Af Amer: >60 mL/min (ref >60)
GFR calc non Af Amer: >60 mL/min (ref >60)
Glucose, Bld: 71 mg/dL (ref 70–99)
Potassium: 2.9 mmol/L — ABNORMAL LOW (ref 3.5–5.1)
Sodium: 135 mmol/L (ref 135–145)
Total Bilirubin: 0.5 mg/dL (ref 0.3–1.2)
Total Protein: 5.6 g/dL — ABNORMAL LOW (ref 6.5–8.1)

## 2020-08-14 LAB — CBC
HCT: 27.5 % — ABNORMAL LOW (ref 36.0–46.0)
Hemoglobin: 8.5 g/dL — ABNORMAL LOW (ref 12.0–15.0)
MCH: 27.6 pg (ref 26.0–34.0)
MCHC: 30.9 g/dL (ref 30.0–36.0)
MCV: 89.3 fL (ref 80.0–100.0)
Platelets: 339 10*3/uL (ref 150–400)
RBC: 3.08 MIL/uL — ABNORMAL LOW (ref 3.87–5.11)
RDW: 17.6 % — ABNORMAL HIGH (ref 11.5–15.5)
WBC: 9.4 10*3/uL (ref 4.0–10.5)
nRBC: 0 % (ref 0.0–0.2)

## 2020-08-14 LAB — URINE CULTURE: Culture: NO GROWTH

## 2020-08-14 LAB — GLUCOSE, CAPILLARY
Glucose-Capillary: 72 mg/dL (ref 70–99)
Glucose-Capillary: 84 mg/dL (ref 70–99)
Glucose-Capillary: 90 mg/dL (ref 70–99)
Glucose-Capillary: 72 mg/dL (ref 70–99)

## 2020-08-14 LAB — MAGNESIUM: Magnesium: 1.5 mg/dL — ABNORMAL LOW (ref 1.7–2.4)

## 2020-08-14 MED ORDER — BOOST / RESOURCE BREEZE PO LIQD CUSTOM
1.0000 | Freq: Three times a day (TID) | ORAL | Status: DC
  Administered 2020-08-14 – 2020-08-17 (×4): 1 via ORAL

## 2020-08-14 MED ORDER — ALBUTEROL SULFATE (2.5 MG/3ML) 0.083% IN NEBU: 2.5000 mg | INHALATION_SOLUTION | Freq: Four times a day (QID) | RESPIRATORY_TRACT | Status: DC | PRN

## 2020-08-14 MED ORDER — POTASSIUM CHLORIDE CRYS ER 20 MEQ PO TBCR
40.0000 meq | EXTENDED_RELEASE_TABLET | Freq: Once | ORAL | Status: AC
  Administered 2020-08-14: 40 meq via ORAL
  Filled 2020-08-14: qty 4

## 2020-08-14 MED ORDER — PEG 3350-KCL-NA BICARB-NACL 420 G PO SOLR
4000.0000 mL | Freq: Once | ORAL | Status: AC
  Administered 2020-08-14: 4000 mL via ORAL
  Filled 2020-08-14: qty 4000

## 2020-08-14 MED ORDER — VITAMIN D 25 MCG (1000 UNIT) PO TABS
2000.0000 [IU] | ORAL_TABLET | Freq: Every day | ORAL | Status: DC
  Administered 2020-08-14 – 2020-08-17 (×3): 2000 [IU] via ORAL
  Filled 2020-08-14 (×4): qty 2

## 2020-08-14 MED ORDER — BRIMONIDINE TARTRATE-TIMOLOL 0.2-0.5 % OP SOLN: 1.0000 [drp] | Freq: Two times a day (BID) | OPHTHALMIC | Status: DC

## 2020-08-14 MED ORDER — VENLAFAXINE HCL ER 75 MG PO CP24
150.0000 mg | ORAL_CAPSULE | Freq: Every day | ORAL | Status: DC
  Administered 2020-08-14 – 2020-08-18 (×5): 150 mg via ORAL
  Filled 2020-08-14 (×6): qty 2

## 2020-08-14 MED ORDER — GABAPENTIN 300 MG PO CAPS
300.0000 mg | ORAL_CAPSULE | Freq: Two times a day (BID) | ORAL | Status: DC
  Administered 2020-08-14 – 2020-08-18 (×8): 300 mg via ORAL
  Filled 2020-08-14 (×10): qty 1

## 2020-08-14 MED ORDER — INSULIN ASPART 100 UNIT/ML ~~LOC~~ SOLN: 0.0000 [IU] | Freq: Three times a day (TID) | SUBCUTANEOUS | Status: DC

## 2020-08-14 MED ORDER — DILTIAZEM HCL ER COATED BEADS 120 MG PO CP24
120.0000 mg | ORAL_CAPSULE | Freq: Every day | ORAL | Status: DC
  Administered 2020-08-14 – 2020-08-18 (×5): 120 mg via ORAL
  Filled 2020-08-14 (×6): qty 1

## 2020-08-14 MED ORDER — MAGNESIUM SULFATE 2 GM/50ML IV SOLN
2.0000 g | Freq: Once | INTRAVENOUS | Status: AC
  Administered 2020-08-14: 2 g via INTRAVENOUS
  Filled 2020-08-14: qty 50

## 2020-08-14 MED ORDER — ALBUTEROL SULFATE HFA 108 (90 BASE) MCG/ACT IN AERS: 2.0000 | INHALATION_SPRAY | Freq: Four times a day (QID) | RESPIRATORY_TRACT | Status: DC | PRN

## 2020-08-14 MED ORDER — ADULT MULTIVITAMIN W/MINERALS CH
1.0000 | ORAL_TABLET | Freq: Every day | ORAL | Status: DC
  Administered 2020-08-14 – 2020-08-18 (×5): 1 via ORAL
  Filled 2020-08-14 (×6): qty 1

## 2020-08-14 MED ORDER — DARIFENACIN HYDROBROMIDE ER 7.5 MG PO TB24
15.0000 mg | ORAL_TABLET | Freq: Every day | ORAL | Status: DC
  Administered 2020-08-14 – 2020-08-18 (×5): 15 mg via ORAL
  Filled 2020-08-14 (×6): qty 2

## 2020-08-14 MED ORDER — BRIMONIDINE TARTRATE 0.2 % OP SOLN
1.0000 [drp] | Freq: Two times a day (BID) | OPHTHALMIC | Status: DC
  Administered 2020-08-14 – 2020-08-18 (×9): 1 [drp] via OPHTHALMIC
  Filled 2020-08-14 (×3): qty 5

## 2020-08-14 MED ORDER — FLUTICASONE PROPIONATE 50 MCG/ACT NA SUSP
2.0000 | Freq: Every day | NASAL | Status: DC
  Administered 2020-08-14 – 2020-08-15 (×2): 2 via NASAL
  Filled 2020-08-14: qty 16

## 2020-08-14 MED ORDER — POTASSIUM CHLORIDE 10 MEQ/100ML IV SOLN
10.0000 meq | INTRAVENOUS | Status: AC
  Administered 2020-08-14 (×4): 10 meq via INTRAVENOUS
  Filled 2020-08-14 (×4): qty 100

## 2020-08-14 MED ORDER — CITALOPRAM HYDROBROMIDE 20 MG PO TABS
10.0000 mg | ORAL_TABLET | Freq: Every day | ORAL | Status: DC
  Administered 2020-08-14 – 2020-08-18 (×5): 10 mg via ORAL
  Filled 2020-08-14 (×6): qty 1

## 2020-08-14 MED ORDER — TIMOLOL MALEATE 0.5 % OP SOLN
1.0000 [drp] | Freq: Two times a day (BID) | OPHTHALMIC | Status: DC
  Administered 2020-08-14 – 2020-08-18 (×9): 1 [drp] via OPHTHALMIC
  Filled 2020-08-14 (×3): qty 5

## 2020-08-14 MED ORDER — PYRIDOSTIGMINE BROMIDE 60 MG PO TABS
60.0000 mg | ORAL_TABLET | Freq: Three times a day (TID) | ORAL | Status: DC
  Administered 2020-08-14 – 2020-08-18 (×9): 60 mg via ORAL
  Filled 2020-08-14 (×11): qty 1

## 2020-08-14 MED ORDER — LORATADINE 10 MG PO TABS
10.0000 mg | ORAL_TABLET | Freq: Every day | ORAL | Status: DC
  Administered 2020-08-14 – 2020-08-18 (×5): 10 mg via ORAL
  Filled 2020-08-14 (×5): qty 1

## 2020-08-14 NOTE — TOC Initial Note (Signed)
Transition of Care [facility]) - Initial/Assessment Note    Patient Details  Name: [name] MRN: [id number] Date of Birth: [birth year]  Transition of Care [facility]) CM/SW Contact:    Natasha Bence, LCSW Phone Number: 08/14/2020, 3:42 PM  Clinical Narrative:                 Patient is a 79 year old female admitted for altered mental status and general weakness. Patient's daughter reported that patient was discharged from [facility] center on 08/10. Patient's daughter reported that when [facility] first attempted discharge, she appealed the discharge and was granted 3 additional days. CSW informed patient's daughter of medicare's 60 days of wellness policy and that an additional referral to SNF may present with some obstacles. Patient's daughter reported that they would be agreeable to utilizing [facility], but would have to further consider OPPT. TOC to follow.  Expected Discharge Plan: [place] Barriers to Discharge: Continued Medical Work up   Patient Goals and CMS Choice     Choice offered to / list presented to : Adult Children  Expected Discharge Plan and Services Expected Discharge Plan: [place]       Living arrangements for the past 2 months: Single Family Home                                      Prior Living Arrangements/Services Living arrangements for the past 2 months: Single Family Home   Patient language and need for interpreter reviewed:: Yes Do you feel safe going back to the place where you live?: Yes      Need for Family Participation in Patient Care: Yes (Comment) Care giver support system in place?: Yes (comment) Current home services: DME (wheelchair, BSC, shower chair.) Criminal Activity/Legal Involvement Pertinent to Current Situation/Hospitalization: No - Comment as needed  Activities of Daily Living Home Assistive Devices/Equipment: Cane (specify quad or straight), Shower chair with back, Environmental consultant (specify  type), Wheelchair ADL Screening (condition at time of admission) Patient's cognitive ability adequate to safely complete daily activities?: Yes Is the patient deaf or have difficulty hearing?: No Does the patient have difficulty seeing, even when wearing glasses/contacts?: No Does the patient have difficulty concentrating, remembering, or making decisions?: No Patient able to express need for assistance with ADLs?: Yes Does the patient have difficulty dressing or bathing?: No Independently performs ADLs?: Yes (appropriate for developmental age) Does the patient have difficulty walking or climbing stairs?: Yes Weakness of Legs: Both Weakness of Arms/Hands: None  Permission Sought/Granted Permission sought to share information with : Family Supports Permission granted to share information with : Yes, Verbal Permission Granted     Permission granted to share info w AGENCY: Local SNF, HH, and OPPT  Permission granted to share info w Relationship: Daughter  Permission granted to share info w Contact Information: [name] [phone]  Emotional Assessment       Orientation: : Oriented to Self, Oriented to Place, Oriented to  Time Alcohol / Substance Use: Not Applicable Psych Involvement: No (comment)  Admission diagnosis:  Altered mental status [R41.82] Altered mental status, unspecified altered mental status type [R41.82] Patient Active Problem List   Diagnosis Date Noted  . Altered mental status 08/13/2020  . Hyperglycemia 08/13/2020  . Generalized weakness 08/13/2020  . Hypoalbuminemia 08/13/2020  . Hospital discharge follow-up 08/12/2020  . Diarrhea 07/15/2020  . GI bleed  07/03/2020  . Abnormal CT scan, colon 07/02/2020  . Normochromic normocytic anemia 06/22/2020  . 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 Chronic non-seasonal allergic rhinitis 06/21/2020  . Type 2 diabetes mellitus with diabetic  neuropathy, unspecified ([place]) 06/21/2020  . Hyperlipidemia associated with type 2 diabetes mellitus ([place]) 06/21/2020  . Diabetic peripheral neuropathy associated with type 2 diabetes mellitus ([place]) 06/21/2020  . Urinary urgency 06/21/2020  . Pelvic mass in female 06/21/2020  . Acute blood loss anemia 06/21/2020  . COPD (chronic obstructive pulmonary disease) ([place]) 06/21/2020  . Increased intraocular pressure, bilateral 06/21/2020  . Enteritis due to Clostridium difficile 06/18/2020  . Acute colitis 06/12/2020  . Hypomagnesemia 06/12/2020  . Colitis 06/12/2020  . Osteoarthritis of both knees 05/24/2020  . Elevated alkaline phosphatase measurement 10/21/2019  . Back pain 07/05/2019  . Rectal bleeding 01/08/2019  . Chronic constipation 01/08/2019  . Nausea without vomiting 11/03/2018  . Trigger finger, right index finger   . Monoclonal gammopathy of unknown significance (MGUS) 11/26/2017  . Memory loss of unknown cause 11/10/2017  . Chronic left shoulder pain 09/14/2017  . Anemia in chronic renal disease 12/30/2015  . Morbid obesity ([place]) 04/18/2015  . Myasthenia gravis in remission ([place]) 11/24/2014  . Myasthenia gravis ([place]) 11/16/2014  . Vitamin D deficiency 05/24/2014  . Osteopenia 04/26/2014  . Macular hole of left eye 01/19/2014  . Generalized osteoarthritis 05/25/2013  . Esophageal dysphagia 04/03/2012  . Abnormal chest CT 03/02/2012  . Insomnia 01/02/2012  . Malignant hypertension 05/30/2011  . Schatzki's ring 05/07/2011  . INGROWN TOENAIL 01/10/2011  . Unspecified glaucoma 08/08/2010  . DYSCHROMIA, UNSPECIFIED 08/08/2010  . CKD stage 3 due to type 2 diabetes mellitus ([place]) 09/14/2009  . HIP PAIN, LEFT 06/09/2009  . Urinary incontinence 06/09/2009  . Obesity (BMI 30.0-34.9) 03/06/2009  . IDA (iron deficiency anemia) 10/02/2008  . Hypothyroidism 09/27/2008  . Hyperlipemia 06/30/2008  . Depression with anxiety 06/30/2008  . GERD 06/30/2008  . Obstructive sleep apnea  06/30/2008  . CARPAL TUNNEL SYNDROME 05/19/2008  . SHOULDER PAIN 02/02/2008  . IMPINGEMENT SYNDROME 02/02/2008  . DEGENERATIVE JOINT DISEASE, KNEE 11/24/2007  . Type 2 diabetes mellitus ([place]) 09/09/2007   PCP:  Fayrene Helper, MD Pharmacy:   [facility], [place] [facility] [address] [place] [address] Phone: [phone] Fax: [phone]  [facility] - [place], [place] - [address] [place] [address] Phone: [phone] Fax: [phone]     Social Determinants of Health ([place]) Interventions    Readmission Risk Interventions Readmission Risk Prevention Plan 07/07/2020  Transportation Screening Complete  Medication Review ([place]) Complete  PCP or Specialist appointment within 3-5 days of discharge Complete  HRI or [place] Not Complete  SW Recovery Care/Counseling Consult Complete  Palliative Care Screening Not [place] Complete  Some recent data might be hidden

## 2020-08-14 NOTE — Progress Notes (Signed)
Initial Nutrition Assessment  RD working remotely.  DOCUMENTATION CODES:   Obesity unspecified  INTERVENTION:   -Boost Breeze po TID, each supplement provides 250 kcal and 9 grams of protein -MVI with minerals daily -RD will follow for diet advancement ad adjust supplement regimen as appropriate  NUTRITION DIAGNOSIS:   Inadequate oral intake related to lethargy/confusion, decreased appetite as evidenced by per patient/family report.  GOAL:   Patient will meet greater than or equal to 90% of their needs  MONITOR:   PO intake, Supplement acceptance, Diet advancement, Labs, Weight trends, Skin, I & O's  REASON FOR ASSESSMENT:   Consult Assessment of nutrition requirement/status  ASSESSMENT:   [name] is a 79 y.o. 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  Pt admitted with AMS and acute colitis.   8/13- CT of abdomen revealed long segment of circumferential wall thickening descending and sigmoid, right adnexal mass 8/14- clear liquid diet initiated  Reviewed I/O's: -886 ml x 24 hours and -284 ml since admission  UOP: 1.1 L x 24 hours  Unable to reach pt via attempted contact to hospital room phone.   Per H&P, pt was discharge home from SNF stay 4 days PTA and had had poor appetite over the past 3 days with increased somnolence and drowsiness.   Per GI notes, plan for colonoscopy prior to discharge once pt's mentation improves.   Reviewed wt hx; pt has experienced a 11% wt loss over the past month, which is significant for time frame.  Albumin has a half-life of 21 days and is strongly affected by stress response and inflammatory process, therefore, do not expect to see an improvement in this lab value during acute  hospitalization. When a patient presents with low albumin, it is likely skewed due to the acute inflammatory response. Note that low albumin is no longer used to diagnose malnutrition;  uses the new malnutrition guidelines published by the American Society for Parenteral and Enteral Nutrition (A.S.P.E.N.) and the Academy of Nutrition and Dietetics (AND).  Medications reviewed and include vitamin D3 and cardizem.    Lab Results  Component Value Date   HGBA1C 6.1 (H) 08/11/2020   PTA DM medications are 5 mg tradjenta daily, 6 units insulin glargine daily, and 3 units insulin lispro TID.   Labs reviewed: K: 2.9 (on IV supplementation), Mg: 1.5 (on IV supplementation), CBGS: 72-89 (inpatient orders for glycemic control are 0-9 units insulin aspart TID with meals ).   Diet Order:   Diet Order            Diet clear liquid Room service appropriate? Yes; Fluid consistency: Thin  Diet effective now                 EDUCATION NEEDS:   No education needs have been identified at this time  Skin:  Skin Assessment: Reviewed RN Assessment  Last BM:  08/14/20  Height:   Ht Readings from Last 1 Encounters:  08/13/20 5\' 6"  (1.676 m)    Weight:   Wt Readings from Last 1 Encounters:  08/13/20 100.4 kg    Ideal Body Weight:  59.1 kg  BMI:  Body mass index is 35.73 kg/m.  Estimated Nutritional Needs:   Kcal:  1850-2050  Protein:  100-115 grams  Fluid:  > 1.8 L    [name]  W, RD, LDN, [place] Registered Dietitian II Certified Diabetes Care and Education Specialist Please refer to [facility] for RD and/or RD on-call/weekend/after hours pager

## 2020-08-14 NOTE — Progress Notes (Addendum)
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 Patient was unable to provide history due to being somnolent, though easily aroused, history was obtained from daughter by phone and ED physician and ED chart. Per report, patient was discharged home from a nursing facility 4 days ago, she lives alone (at baseline, she is able to take care of most of her ADLs and maintain normal conversation and daughter checks on her regularly), but patient has been feeling weak since past 3 days, she has had poor appetite and has been more somnolent and drowsy at home. Daughter activated EMS and she was sent to the ED for further evaluation and management. Patient was seen by her PCP on 8/12 during which she complained of ongoing rectal bleeding (patient has a pending GI follow-up). She was admitted to this facility from 7/4-7/8 due to acute blood loss anemia and recurrent C. difficile colitis.  -Patient was admitted with acute encephalopathy of unknown origin and thus far, negative work-up.  She is also noted to have acute colitis/diverticulitis.  Additionally she is noted to have very mild blood loss anemia with positive Hemoccult.  GI consulted for evaluation with further recommendations to follow by a.m.  PICC team has placed IV access and patient remains on PPI and Zosyn for now.  8/15: Hemoglobin levels slightly downward trending this morning with no overt bleeding noted.  She appears to be tolerating clear liquid diet.  GI recommendations to continue current diet and PPI.  Assessment & Plan:   Principal  Problem:   Altered mental status Active Problems:   Type 2 diabetes mellitus (HCC)   Hyperlipemia   Obesity (BMI 30.0-34.9)   IDA (iron deficiency anemia)   GERD   Acute colitis   Type 2 diabetes mellitus with diabetic neuropathy, unspecified (HCC)   Pelvic mass in female   GI bleed   Hyperglycemia   Generalized weakness   Hypoalbuminemia   Transient acute encephalopathy-resolved -Possibly related to acute colitis -Ammonia level within normal limits -Patient is back to baseline level of mentation  Acute colitis/diverticulitis -Continue IV Zosyn -No significant diarrhea or abdominal pain currently noted  Generalized weakness in setting of acute blood loss anemia -Possibly related to acute colitis -Hemoglobin level downward trending, continue to monitor in a.m. -PT/OT evaluation ordered -Continue IV Protonix drip -Appreciate GI consultation with consideration of colonoscopy by am  Iron deficiency anemia -We will need to consider iron supplementation on discharge -Close follow-up recommended  Myasthenia gravis -Resume home [place]  Anxiety disorder -Resume home medications  GERD -Continue PPI infusion  Type 2 diabetes with hyperglycemia-improved -Continue SSI  Hypoalbuminemia possibly secondary to moderate protein calorie malnutrition -Dietitian consult for supplementation  Obesity -Lifestyle changes  DVT prophylaxis: SCDs Code Status: Full code Family Communication: Discussed with daughter on phone 8/15 Disposition Plan:   Status is: Inpatient  Remains inpatient appropriate because:Ongoing diagnostic testing needed not appropriate for outpatient work up and IV treatments appropriate due to intensity of illness or inability to take PO   Dispo: The patient is from: Home              Anticipated d/c is to: Home  Anticipated d/c date is: 2 days              Patient currently is not medically stable to d/c.  Patient may require inpatient  endoscopy prior to discharge.  Consultants:   GI  Procedures:   See below  Antimicrobials:  Anti-infectives (From admission, onward)   Start     Dose/Rate Route Frequency Ordered Stop   08/13/20 0600  piperacillin-tazobactam (ZOSYN) IVPB 3.375 g     Discontinue     3.375 g 12.5 mL/hr over 240 Minutes Intravenous Every 8 hours 08/13/20 0319 08/23/20 0559   08/13/20 0000  piperacillin-tazobactam (ZOSYN) IVPB 3.375 g        3.375 g 100 mL/hr over 30 Minutes Intravenous  Once 08/12/20 2348 08/13/20 0022       Subjective: Patient seen and evaluated today with no new acute complaints or concerns. No acute concerns or events noted overnight.  She denies any abdominal pain or diarrhea.  No nausea or vomiting noted.  Objective: Vitals:   08/13/20 1405 08/13/20 1940 08/14/20 0006 08/14/20 0449  BP: (!) 181/72 (!) 179/63 (!) 168/62 (!) 179/73  Pulse: 74 78 86 83  Resp: 20 16 16 16   Temp: 98.5 F (36.9 C) 100.1 F (37.8 C) 99.2 F (37.3 C) 98.7 F (37.1 C)  TempSrc: Oral Oral    SpO2: 100% 100% 100% 97%  Weight: 100.4 kg     Height: 5\' 6"  (1.676 m)       Intake/Output Summary (Last 24 hours) at 08/14/2020 1137 Last data filed at 08/14/2020 0316 Gross per 24 hour  Intake 164.08 ml  Output 1050 ml  Net -885.92 ml   Filed Weights   08/13/20 1405  Weight: 100.4 kg    Examination:  General exam: Appears calm and comfortable, obese Respiratory system: Clear to auscultation. Respiratory effort normal. Cardiovascular system: S1 & S2 heard, RRR.  Gastrointestinal system: Abdomen is soft Central nervous system: Alert and oriented. No focal neurological deficits. Extremities: Symmetric 5 x 5 power. Skin: No rashes, lesions or ulcers Psychiatry: Flat affect    Data Reviewed: I have personally reviewed following labs and imaging studies  CBC: Recent Labs  Lab 08/11/20 1335 08/12/20 1732 08/13/20 0259 08/14/20 0812  WBC 7.9 16.2* 9.8 9.4  NEUTROABS  --  13.9*  --    --   HGB 8.1* 8.4* 9.1* 8.5*  HCT 27.6* 27.8* 30.8* 27.5*  MCV 94.8 92.4 94.2 89.3  PLT 363 369 368 625   Basic Metabolic Panel: Recent Labs  Lab 08/11/20 1335 08/12/20 1732 08/13/20 0259 08/14/20 0812  NA 138 136 139 135  K 3.6 3.8 3.5 2.9*  CL 108 106 106 100  CO2 22 23 24 25   GLUCOSE 149* 178* 99 71  BUN 15 12 11 8   CREATININE 0.89 0.94 0.75 0.71  CALCIUM 8.5* 8.5* 8.4* 7.7*  MG  --   --  1.7 1.5*  PHOS  --   --  3.2  --    GFR: Estimated Creatinine Clearance: 70.4 mL/min (by C-G formula based on SCr of 0.71 mg/dL). Liver Function Tests: Recent Labs  Lab 08/12/20 1732 08/13/20 0259 08/14/20 0812  AST 19 16 16   ALT 19 21 16   ALKPHOS 94 104 91  BILITOT 0.4 0.3 0.5  PROT 6.4* 7.1 5.6*  ALBUMIN 2.2* 2.4* 1.9*   No results for input(s): LIPASE, AMYLASE in the last 168 hours. Recent Labs  Lab 08/12/20 1732  AMMONIA 28   Coagulation Profile:  Recent Labs  Lab 08/12/20 1733 08/13/20 0259  INR 1.3* 1.4*   Cardiac Enzymes: No results for input(s): CKTOTAL, CKMB, CKMBINDEX, TROPONINI in the last 168 hours. BNP (last 3 results) No results for input(s): PROBNP in the last 8760 hours. HbA1C: Recent Labs    08/11/20 1335  HGBA1C 6.1*   CBG: Recent Labs  Lab 08/13/20 1650 08/13/20 1832 08/13/20 2054 08/13/20 2240 08/14/20 0756  GLUCAP 53* 87 77 89 72   Lipid Profile: No results for input(s): CHOL, HDL, LDLCALC, TRIG, CHOLHDL, LDLDIRECT in the last 72 hours. Thyroid Function Tests: Recent Labs    08/11/20 1335  TSH 1.404   Anemia Panel: Recent Labs    08/11/20 1335  FERRITIN 244  IRON 14*   Sepsis Labs: Recent Labs  Lab 08/12/20 1733 08/13/20 0045 08/13/20 0259  PROCALCITON 3.42  --   --   LATICACIDVEN  --  1.7 1.7    Recent Results (from the past 240 hour(s))  C Difficile Quick Screen w PCR reflex     Status: None   Collection Time: 08/07/20  8:20 AM  Result Value Ref Range Status   C Diff antigen NEGATIVE NEGATIVE Final   C Diff  toxin NEGATIVE NEGATIVE Final   C Diff interpretation No C. difficile detected.  Final    Comment: Performed at [facility], [address] [place], [place] [address]  Urine culture     Status: None   Collection Time: 08/12/20  5:07 PM   Specimen: Urine, Catheterized  Result Value Ref Range Status   Specimen Description   Final    URINE, CATHETERIZED Performed at [facility], [address], [place], [place] [address]    Special Requests   Final    NONE Performed at [facility], [address] [place], [place] [address]    Culture   Final    NO GROWTH Performed at [facility], [place] [address] [place], [place] [address]    Report Status 08/14/2020 FINAL  Final  Blood culture (routine single)     Status: None (Preliminary result)   Collection Time: 08/12/20  5:33 PM   Specimen: BLOOD RIGHT HAND  Result Value Ref Range Status   Specimen Description BLOOD RIGHT HAND  Final   Special Requests   Final    BOTTLES DRAWN AEROBIC AND ANAEROBIC Blood Culture adequate volume   Culture   Final    NO GROWTH < 12 HOURS Performed at [facility], [address], [place], [place] [address]    Report Status PENDING  Incomplete  SARS Coronavirus 2 by RT PCR (hospital order, performed in [facility] hospital lab) Nasopharyngeal Nasopharyngeal Swab     Status: None   Collection Time: 08/12/20  6:55 PM   Specimen: Nasopharyngeal Swab  Result Value Ref Range Status   SARS Coronavirus 2 NEGATIVE NEGATIVE Final    Comment: (NOTE) SARS-CoV-2 target nucleic acids are NOT DETECTED.  The SARS-CoV-2 RNA is generally detectable in upper and lower respiratory specimens during the acute phase of infection. The lowest concentration of SARS-CoV-2 viral copies this assay can detect is 250 copies / mL. A negative result does not preclude SARS-CoV-2 infection and should not be used as the sole basis for treatment or other patient management decisions.  A negative result may occur with improper  specimen collection / handling, submission of specimen other than nasopharyngeal swab, presence of viral mutation(s) within the areas targeted by this assay, and inadequate number of viral copies (<250  copies / mL). A negative result must be combined with clinical observations, patient history, and epidemiological information.  Fact Sheet for Patients:   StrictlyIdeas.no  Fact Sheet for Healthcare Providers: BankingDealers.co.za  This test is not yet approved or  cleared by the [place] FDA and has been authorized for detection and/or diagnosis of SARS-CoV-2 by FDA under an Emergency Use Authorization (EUA).  This EUA will remain in effect (meaning this test can be used) for the duration of the COVID-19 declaration under Section 564(b)(1) of the Act, 21 U.S.C. section 360bbb-3(b)(1), unless the authorization is terminated or revoked sooner.  Performed at [facility], [address], [place],  [address]          Radiology Studies: CT Head Wo Contrast  Result Date: 08/12/2020 CLINICAL DATA:  Mental status change, unknown cause. Additional history provided: Confusion. EXAM: CT HEAD WITHOUT CONTRAST TECHNIQUE: Contiguous axial images were obtained from the base of the skull through the vertex without intravenous contrast. COMPARISON:  Brain MRI 05/18/2013 FINDINGS: Brain: Mild generalized parenchymal atrophy. Mild ill-defined hypoattenuation within the cerebral white matter is nonspecific, but consistent with chronic small vessel ischemic disease. Findings are similar as compared to the MRI of 05/18/2013. There is no acute intracranial hemorrhage. No demarcated cortical infarct is identified. No extra-axial fluid collection. No evidence of intracranial mass. No midline shift. Partially empty sella turcica. Vascular: No hyperdense vessel.  Atherosclerotic calcifications. Skull: Normal. Negative for fracture or focal lesion.  Sinuses/Orbits: Visualized orbits show no acute finding. Sphenoid sinus mucosal thickening, moderate on the right, mild on the left). Bilateral mastoid effusions. IMPRESSION: No CT evidence of acute intracranial abnormality. Mild generalized parenchymal atrophy and chronic small vessel ischemic disease, without appreciable change as compared to the MRI of 05/18/2013. Sphenoid sinusitis. Bilateral mastoid effusions. Electronically Signed   By: Kellie Simmering DO   On: 08/12/2020 18:07   CT ABDOMEN PELVIS W CONTRAST  Result Date: 08/12/2020 CLINICAL DATA:  79 year old female with abdominal pain and fever. EXAM: CT ABDOMEN AND PELVIS WITH CONTRAST TECHNIQUE: Multidetector CT imaging of the abdomen and pelvis was performed using the standard protocol following bolus administration of intravenous contrast. CONTRAST:  132mL OMNIPAQUE IOHEXOL 300 MG/ML  SOLN COMPARISON:  CT abdomen pelvis dated 06/12/2020. FINDINGS: Lower chest: There are bibasilar dependent atelectatic changes. There is mild cardiomegaly. Three vessel coronary vascular calcification. There is no intra-abdominal free air or free fluid. Hepatobiliary: Probable mild fatty liver. No intrahepatic biliary ductal dilatation. Cholecystectomy. No retained calcified stone noted in the central CBD. Pancreas: Unremarkable. No pancreatic ductal dilatation or surrounding inflammatory changes. Spleen: Normal in size without focal abnormality. Adrenals/Urinary Tract: The adrenal glands unremarkable. There is mild bilateral renal parenchyma atrophy and cortical irregularity and scarring. Small bilateral renal cysts and subcentimeter hypodensities which are too small to characterize. There is no hydronephrosis on either side. There is symmetric enhancement and excretion of contrast by both kidneys. The visualized ureters and urinary bladder appear unremarkable. Stomach/Bowel: There is diffuse diverticulosis of the descending and sigmoid colon. There is long segment  circumferential thickening of the descending and sigmoid colon with stranding of the adjacent fat which may be inflammatory/infectious in etiology. A colonic mass is not excluded. Overall similar or slight progression of the inflammatory changes compared to the prior CT. Multiple scattered rounded lymph nodes again seen along this segment of the colon as seen on the prior CT. There is a small hiatal hernia. Moderate stool noted throughout the colon. No bowel obstruction. Appendectomy. Vascular/Lymphatic: Moderate  aortoiliac atherosclerotic disease. The IVC is unremarkable. No portal venous gas. There is no adenopathy. Reproductive: Hysterectomy. There is a 2.5 cm right adnexal cyst. Other: None Musculoskeletal: Degenerative changes of the spine. No acute osseous pathology. IMPRESSION: 1. Distal colonic diverticulosis with long segment circumferential thickening of the descending and sigmoid colon. Overall similar or slightly progression of inflammatory changes compared to the prior CT. Findings may the inflammatory/infectious in etiology. Underlying colonic mass is not excluded. Clinical correlation and further evaluation with colonoscopy on a nonemergent/outpatient basis and following resolution of acute symptoms recommended. 2. A 2.5 cm right adnexal cyst. Further evaluation with pelvic ultrasound on a nonemergent basis recommended. 3. Aortic Atherosclerosis (ICD10-I70.0). Electronically Signed   By: Anner Crete M.D.   On: 08/12/2020 23:33   DG Chest Portable 1 View  Result Date: 08/12/2020 CLINICAL DATA:  Infection workup EXAM: PORTABLE CHEST 1 VIEW COMPARISON:  04/29/2020 FINDINGS: Cardiomegaly. No confluent airspace opacities, effusions or edema. No acute bony abnormality. IMPRESSION: Cardiomegaly.  No active disease. Electronically Signed   By: Rolm Baptise M.D.   On: 08/12/2020 18:28   [place] EKG SITE RITE  Result Date: 08/13/2020 If Site Rite image not attached, placement could not be confirmed due  to current cardiac rhythm.       Scheduled Meds: . Chlorhexidine Gluconate Cloth  6 each Topical Daily  . [START ON 08/16/2020] pantoprazole  40 mg Intravenous Q12H  . potassium chloride  40 mEq Oral Once  . sodium chloride flush  10-40 mL Intracatheter Q12H   Continuous Infusions: . magnesium sulfate bolus IVPB    . pantoprozole (PROTONIX) infusion 8 mg/hr (08/14/20 0412)  . piperacillin-tazobactam 3.375 g (08/14/20 0517)  . potassium chloride       LOS: 1 day    Time spent: 35 minutes    Arvie Bartholomew Darleen Crocker, DO [facility]  If 7PM-7AM, please contact night-coverage [email or web address] 08/14/2020, 11:37 AM

## 2020-08-14 NOTE — Consult Note (Addendum)
Referring Provider: No ref. provider found Primary Care Physician:  Fayrene Helper, MD Primary Gastroenterologist:  Dr. Gala Romney  Reason for Consultation: Anemia; GI bleed  HPI: 79 year old with multiple medical problems admitted with acute encephalopathy.  Recent GI issues including recalcitrant C. difficile diarrhea treated with vancomycin followed by Dificid for 10 days due to marginal improvement/recurrence.   Intermittent rectal bleeding requiring transfusion.  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  Diverticulosis small polyps on February 2020 colonoscopy.  Prior EGD demonstrated Schatzki's ring -dilated previously. Her white count is normal at 9.8 yesterday and 9.4 today.  Hemoglobin 8.5 this morning (8.4 2 days ago) CT of the abdomen 8/13 demonstrated a long segment of circumferential wall thickening descending and sigmoid.  May be a little more prominent than seen on the prior CT. Also 2.5 cm right adnexal mass for which pelvic ultrasound was subsequently performed-nondiagnostic.  MRI recommended. Work-up of acute encephalopathy in process.    Past Medical History:  Diagnosis Date  . Anemia in chronic renal disease 12/30/2015  . Anxiety   . Arthritis    "all over" (01/19/2014)  . Arthritis, lumbar spine   . Carpal tunnel syndrome   . Chronic bronchitis ([place])    "got it q yr for awhile; hasn't had it in awhile" (01/19/2014)  . Chronic kidney disease (CKD) stage G3b/A1, moderately decreased glomerular filtration rate (GFR) between 30-44 mL/min/1.73 square meter and albuminuria creatinine ratio less than 30 mg/g 09/14/2009   Qualifier: Diagnosis of  By: Moshe Cipro MD, Joycelyn Schmid    . Complication of anesthesia    combative  . COPD (chronic obstructive pulmonary disease) ([place])   . Depression   . Diverticulosis 07/2004   Colonscopy Dr Gala Romney  . Esophagitis, erosive 2009  . Exertional shortness of breath   . Gastroesophageal reflux   .  ELFYBOFB(510.2)    "usually a couple times/wk" (01/19/2014)  . Heart murmur    [place] cardiology [place], he told her she did not need to come back.  . Hyperlipidemia   . Hypertension   . Impingement syndrome, shoulder   . Iron deficiency anemia   . Mitral regurgitation   . Myasthenia gravis   . Myasthenia gravis in remission ([place]) 11/24/2014  . Obesity   . OSA on CPAP    Negative on last sleep study  . Pneumonia 01/2012  . Pulmonary HTN ([place])   . Schatzki's ring    Last EGD w/ dilation 02/08/11, 2009 & 2007  . Seasonal allergies   . Shoulder pain   . Thyroid disease    "used to take RX; they took me off it" (01/19/2014)  . Tobacco abuse   . Type II diabetes mellitus ([place])     Past Surgical History:  Procedure Laterality Date  . Divide VITRECTOMY WITH 20 GAUGE MVR PORT Left 01/19/2014   Procedure: 25 GAUGE PARS PLANA VITRECTOMY WITH 20 GAUGE MVR PORT; MEMBRAME PEEL; SERUM PATCH; LASER TREATMENT; C3F8;  Surgeon: Hayden Pedro, MD;  Location: [place];  Service: Ophthalmology;  Laterality: Left;  . ABDOMINAL HYSTERECTOMY    . AGILE CAPSULE N/A 08/05/2019   Procedure: AGILE CAPSULE;  Surgeon: Daneil Dolin, MD;  [facility];  Service: Endoscopy;  Laterality: N/A;  7:30am  . APPENDECTOMY    . CARPAL TUNNEL RELEASE Bilateral   . CATARACT EXTRACTION W/PHACO Left 10/21/2013   Procedure: LEFT CATARACT EXTRACTION PHACO AND INTRAOCULAR LENS PLACEMENT (IOC);  Surgeon: Marylynn Pearson,  MD;  Location: [place];  Service: Ophthalmology;  Laterality: Left;  . CHOLECYSTECTOMY    . COLONOSCOPY  05/08/2012   TLX:BWIOMBTD and external hemorrhoids; colonic diverticulosis  . COLONOSCOPY WITH PROPOFOL N/A 02/26/2019   two simple adenomas, diverticulosis,  and internal hemorrhoids.   . ESOPHAGEAL DILATION     "more than 3 times" (01/19/2014)  . ESOPHAGOGASTRODUODENOSCOPY  02/08/11   [place]-Distal esophageal erosion consistent with mild erosive reflux   esophagitis/ Noncritical Schatzki  ring, small hiatal hernia otherwise upper/ gastrointestinal tract appeared unremarkable, status post passage  of a Maloney dilation to biopsy disruption of the ring described  . ESOPHAGOGASTRODUODENOSCOPY  04/2012   2 tandem incomplete distal esophagea rings s/p dilation.   . ESOPHAGOGASTRODUODENOSCOPY N/A 09/16/2014   Dr. Gala Romney: Schatzki ring status post dilation/disruption.  Hiatal hernia.  Marland Kitchen ESOPHAGOGASTRODUODENOSCOPY (EGD) WITH PROPOFOL N/A 12/08/2018   EGD with mild Schatzki ring s/p dilation, small hiatal hernia, otherwise normal  . EYE SURGERY     cataracts, bilateral  . GIVENS CAPSULE STUDY N/A 09/29/2019   Procedure: GIVENS CAPSULE STUDY;  Surgeon: Daneil Dolin, MD;  [facility];  Service: Endoscopy;  Laterality: N/A;  7:30am  . INCONTINENCE SURGERY  08/26/09   [place]  . JOINT REPLACEMENT     right total knee  . MALONEY DILATION N/A 09/16/2014   Procedure: Venia Minks DILATION;  Surgeon: Daneil Dolin, MD;  [facility];  Service: Endoscopy;  Laterality: N/A;  . Venia Minks DILATION N/A 12/08/2018   Procedure: Venia Minks DILATION;  Surgeon: Daneil Dolin, MD;  [facility];  Service: Endoscopy;  Laterality: N/A;  . PARS PLANA VITRECTOMY W/ REPAIR OF MACULAR HOLE Left 01/19/2014  . POLYPECTOMY  02/26/2019   Procedure: POLYPECTOMY;  Surgeon: Danie Binder, MD;  [facility];  Service: Endoscopy;;  ascending colon polyps x2  . SAVORY DILATION N/A 09/16/2014   Procedure: SAVORY DILATION;  Surgeon: Daneil Dolin, MD;  [facility];  Service: Endoscopy;  Laterality: N/A;  . TONSILLECTOMY    . TOTAL KNEE ARTHROPLASTY Right 05/13/07   Dr. Aline Brochure  . TRIGGER FINGER RELEASE Right 06/26/2018   Procedure: RIGHT INDEX FINGER TRIGGER FINGER/A-1 PULLEY RELEASE;  Surgeon: Carole Civil, MD;  Location: [facility];  Service: Orthopedics;  Laterality: Right;    Prior to Admission medications   Medication Sig Start Date End Date Taking?  Authorizing Provider  acetaminophen (TYLENOL) 325 MG tablet Take 2 tablets (650 mg total) by mouth every 6 (six) hours as needed for mild pain, fever or headache (or Fever >/= 101). 06/20/20  Yes Emokpae, Courage, MD  brimonidine-timolol (COMBIGAN) 0.2-0.5 % ophthalmic solution Place 1 drop into both eyes every 12 (twelve) hours. 08/08/20  Yes Gerlene Fee, NP  Cholecalciferol (VITAMIN D3) 2000 units TABS Take 2,000 Units by mouth at bedtime.   Yes [provider]  cholestyramine Lucrezia Starch) 4 GM/DOSE powder Take 1 packet (4 g total) by mouth 2 (two) times daily with a meal. For stool bulking 08/08/20  Yes Gerlene Fee, NP  citalopram (CELEXA) 10 MG tablet Take 1 tablet (10 mg total) by mouth daily. 08/08/20  Yes Gerlene Fee, NP  Cranberry 1000 MG CAPS Take 1,000 mg by mouth daily.   Yes [provider]  cycloSPORINE (RESTASIS) 0.05 % ophthalmic emulsion Place 1 drop into both eyes 2 (two) times daily. 08/08/20  Yes Gerlene Fee, NP  diltiazem (CARDIZEM CD) 120 MG 24 hr capsule Take 1  capsule (120 mg total) by mouth daily. 08/08/20  Yes Gerlene Fee, NP  ezetimibe (ZETIA) 10 MG tablet Take 1 tablet (10 mg total) by mouth daily. 08/08/20  Yes Gerlene Fee, NP  famotidine (PEPCID) 40 MG tablet Take 1 tablet (40 mg total) by mouth every evening. 08/08/20  Yes Gerlene Fee, NP  fluticasone (FLONASE) 50 MCG/ACT nasal spray Place 2 sprays into both nostrils daily. 08/08/20  Yes Gerlene Fee, NP  gabapentin (NEURONTIN) 300 MG capsule TAKE (1) CAPSULE BY MOUTH TWICE DAILY. 08/08/20  Yes Gerlene Fee, NP  hydrocortisone (ANUSOL-HC) 2.5 % rectal cream Place 1 application rectally 2 (two) times daily. 07/01/20  Yes Jodi Mourning, Kristen S, PA-C  insulin glargine (LANTUS SOLOSTAR) 100 UNIT/ML Solostar Pen Inject 6 Units into the skin at bedtime. 08/08/20  Yes Gerlene Fee, NP  insulin lispro (HUMALOG KWIKPEN) 100 UNIT/ML KwikPen Inject 0.03 mLs (3 Units total) into the skin 3 (three)  times daily. insulin pen; 100 unit/mL; amt: Per Sliding Scale; If Blood Sugar is 0 to 149, give 0 Units. If Blood Sugar is greater than 149, give 3 Units. subcutaneous Before Meals 08/08/20  Yes Gerlene Fee, NP  linagliptin (TRADJENTA) 5 MG TABS tablet Take 1 tablet (5 mg total) by mouth daily. 08/08/20  Yes Gerlene Fee, NP  losartan (COZAAR) 25 MG tablet Take 1 tablet (25 mg total) by mouth daily. 08/08/20  Yes Gerlene Fee, NP  Multiple Vitamin (MULTIVITAMIN WITH MINERALS) TABS tablet Take 1 tablet by mouth at bedtime.   Yes [provider]  nystatin (NYSTATIN) powder Apply 1 application topically daily as needed. Special Instructions: fungal rash to groin and inguinal folds 06/20/20  Yes [provider]  predniSONE (DELTASONE) 5 MG tablet Take 1 tablet (5 mg total) by mouth daily. 08/08/20  Yes Gerlene Fee, NP  Probiotic Product (RISA-BID PROBIOTIC PO) Take 1 tablet by mouth in the morning and at bedtime.   Yes [provider]  pyridostigmine (MESTINON) 60 MG tablet Take three times daily 08/08/20  Yes Gerlene Fee, NP  rosuvastatin (CRESTOR) 40 MG tablet TAKE (1) TABLET BY MOUTH AT BEDTIME. 08/08/20  Yes Gerlene Fee, NP  solifenacin (VESICARE) 10 MG tablet Take 1 tablet (10 mg total) by mouth daily. 08/08/20  Yes Gerlene Fee, NP  venlafaxine XR (EFFEXOR-XR) 150 MG 24 hr capsule Take 1 capsule (150 mg total) by mouth daily. 08/08/20  Yes Gerlene Fee, NP  albuterol (PROAIR HFA) 108 (90 Base) MCG/ACT inhaler Inhale 2 puffs into the lungs every 6 (six) hours as needed. 08/08/20   Gerlene Fee, NP  cetirizine (ZYRTEC) 10 MG tablet TAKE 1 TABLET BY MOUTH ONCE A DAY. 05/17/20   Fayrene Helper, MD  ondansetron (ZOFRAN) 4 MG tablet Take 1 tablet (4 mg total) by mouth every 8 (eight) hours as needed for nausea or vomiting. 07/01/20   Erenest Rasher, PA-C    Current Facility-Administered Medications  Medication Dose Route Frequency Provider Last Rate  Last Admin  . Chlorhexidine Gluconate Cloth 2 % PADS 6 each  6 each Topical Daily Heath Lark D, DO   6 each at 08/13/20 1437  . pantoprazole (PROTONIX) 80 mg in sodium chloride 0.9 % 100 mL (0.8 mg/mL) infusion  8 mg/hr Intravenous Continuous Adefeso, Oladapo, DO 10 mL/hr at 08/14/20 0412 8 mg/hr at 08/14/20 0412  . [START ON 08/16/2020] pantoprazole (PROTONIX) injection 40 mg  40 mg Intravenous Q12H Adefeso,  Oladapo, DO      . piperacillin-tazobactam (ZOSYN) IVPB 3.375 g  3.375 g Intravenous Q8H Adefeso, Oladapo, DO 12.5 mL/hr at 08/14/20 0517 3.375 g at 08/14/20 0517  . sodium chloride flush (NS) 0.9 % injection 10-40 mL  10-40 mL Intracatheter Q12H Shah, Pratik D, DO   10 mL at 08/13/20 2109  . sodium chloride flush (NS) 0.9 % injection 10-40 mL  10-40 mL Intracatheter PRN Manuella Ghazi, Pratik D, DO        Allergies as of 08/12/2020  . (No Known Allergies)    Family History  Problem Relation Age of Onset  . Cancer Mother   . Heart disease Mother   . Cancer Father   . Heart disease Father   . Diabetes Sister   . Hypertension Brother   . Heart disease Brother   . Cancer Sister   . Kidney failure Sister   . Diabetes Son   . Hypertension Son   . Hypertension Daughter   . Colon cancer Neg Hx     Social History   Socioeconomic History  . Marital status: Single    Spouse name: Not on file  . Number of children: 2  . Years of education: Not on file  . Highest education level: Not on file  Occupational History  . Occupation: disabled    Employer: RETIRED  Tobacco Use  . Smoking status: Former Smoker    Packs/day: 0.50    Years: 25.00    Pack years: 12.50    Types: Cigarettes    Quit date: 01/01/2004    Years since quitting: 16.6  . Smokeless tobacco: Never Used  Vaping Use  . Vaping Use: Never used  Substance and Sexual Activity  . Alcohol use: No  . Drug use: No  . Sexual activity: Never    Birth control/protection: Surgical  Other Topics Concern  . Not on file  Social  History Narrative   Patient lives at home by herself   Patient drinks one cup of coffee a day   Patient is right handed.   Social Determinants of Health   Financial Resource Strain:   . Difficulty of Paying Living Expenses:   Food Insecurity:   . Worried About Charity fundraiser in the Last Year:   . Arboriculturist in the Last Year:   Transportation Needs:   . Film/video editor (Medical):   Marland Kitchen Lack of Transportation (Non-Medical):   Physical Activity:   . Days of Exercise per Week:   . Minutes of Exercise per Session:   Stress:   . Feeling of Stress :   Social Connections:   . Frequency of Communication with Friends and Family:   . Frequency of Social Gatherings with Friends and Family:   . Attends Religious Services:   . Active Member of Clubs or Organizations:   . Attends Archivist Meetings:   Marland Kitchen Marital Status:   Intimate Partner Violence:   . Fear of Current or Ex-Partner:   . Emotionally Abused:   Marland Kitchen Physically Abused:   . Sexually Abused:     Review of Systems: Limited due to mental status  Physical Exam: Vital signs in last 24 hours: Temp:  [98.5 F (36.9 C)-100.1 F (37.8 C)] 98.7 F (37.1 C) (08/15 0449) Pulse Rate:  [74-86] 83 (08/15 0449) Resp:  [16-20] 16 (08/15 0449) BP: (168-187)/(62-73) 179/73 (08/15 0449) SpO2:  [97 %-100 %] 97 % (08/15 0449) Weight:  [100.4 kg] 100.4 kg (08/14  1405) Last BM Date: 08/13/20 General:   Awake.  Pleasant and cooperative in NAD.  Does not know the day or month.  Knows she is in the hospital.  Says her abdomen hurts. Lungs:  Clear throughout to auscultation.   No wheezes, crackles, or rhonchi. No acute distress. Heart:  Regular rate and rhythm; no murmurs, clicks, rubs,  or gallops. Abdomen: Full.  Positive bowel sounds.  She has rather diffuse left upper and left lower quadrant abdominal tenderness.  Guards to deep palpation.  No obvious mass.  Intake/Output from previous day: 08/14 0701 - 08/15 0700 In:  164.1 [I.V.:74.9; IV Piggyback:89.2] Out: 1050 [Urine:1050] Intake/Output this shift: No intake/output data recorded.  Lab Results: Recent Labs    08/12/20 1732 08/13/20 0259 08/14/20 0812  WBC 16.2* 9.8 9.4  HGB 8.4* 9.1* 8.5*  HCT 27.8* 30.8* 27.5*  PLT 369 368 339   BMET Recent Labs    08/11/20 1335 08/12/20 1732 08/13/20 0259  NA 138 136 139  K 3.6 3.8 3.5  CL 108 106 106  CO2 22 23 24   GLUCOSE 149* 178* 99  BUN 15 12 11   CREATININE 0.89 0.94 0.75  CALCIUM 8.5* 8.5* 8.4*   LFT Recent Labs    08/13/20 0259  PROT 7.1  ALBUMIN 2.4*  AST 16  ALT 21  ALKPHOS 104  BILITOT 0.3   PT/INR Recent Labs    08/12/20 1733 08/13/20 0259  LABPROT 15.7* 16.1*  INR 1.3* 1.4*   Hepatitis Panel No results for input(s): HEPBSAG, HCVAB, HEPAIGM, HEPBIGM in the last 72 hours. C-Diff No results for input(s): CDIFFTOX in the last 72 hours.  Studies/Results: CT Head Wo Contrast  Result Date: 08/12/2020 CLINICAL DATA:  Mental status change, unknown cause. Additional history provided: Confusion. EXAM: CT HEAD WITHOUT CONTRAST TECHNIQUE: Contiguous axial images were obtained from the base of the skull through the vertex without intravenous contrast. COMPARISON:  Brain MRI 05/18/2013 FINDINGS: Brain: Mild generalized parenchymal atrophy. Mild ill-defined hypoattenuation within the cerebral white matter is nonspecific, but consistent with chronic small vessel ischemic disease. Findings are similar as compared to the MRI of 05/18/2013. There is no acute intracranial hemorrhage. No demarcated cortical infarct is identified. No extra-axial fluid collection. No evidence of intracranial mass. No midline shift. Partially empty sella turcica. Vascular: No hyperdense vessel.  Atherosclerotic calcifications. Skull: Normal. Negative for fracture or focal lesion. Sinuses/Orbits: Visualized orbits show no acute finding. Sphenoid sinus mucosal thickening, moderate on the right, mild on the left).  Bilateral mastoid effusions. IMPRESSION: No CT evidence of acute intracranial abnormality. Mild generalized parenchymal atrophy and chronic small vessel ischemic disease, without appreciable change as compared to the MRI of 05/18/2013. Sphenoid sinusitis. Bilateral mastoid effusions. Electronically Signed   By: Kellie Simmering DO   On: 08/12/2020 18:07   CT ABDOMEN PELVIS W CONTRAST  Result Date: 08/12/2020 CLINICAL DATA:  79 year old female with abdominal pain and fever. EXAM: CT ABDOMEN AND PELVIS WITH CONTRAST TECHNIQUE: Multidetector CT imaging of the abdomen and pelvis was performed using the standard protocol following bolus administration of intravenous contrast. CONTRAST:  148mL OMNIPAQUE IOHEXOL 300 MG/ML  SOLN COMPARISON:  CT abdomen pelvis dated 06/12/2020. FINDINGS: Lower chest: There are bibasilar dependent atelectatic changes. There is mild cardiomegaly. Three vessel coronary vascular calcification. There is no intra-abdominal free air or free fluid. Hepatobiliary: Probable mild fatty liver. No intrahepatic biliary ductal dilatation. Cholecystectomy. No retained calcified stone noted in the central CBD. Pancreas: Unremarkable. No pancreatic ductal dilatation or surrounding inflammatory  changes. Spleen: Normal in size without focal abnormality. Adrenals/Urinary Tract: The adrenal glands unremarkable. There is mild bilateral renal parenchyma atrophy and cortical irregularity and scarring. Small bilateral renal cysts and subcentimeter hypodensities which are too small to characterize. There is no hydronephrosis on either side. There is symmetric enhancement and excretion of contrast by both kidneys. The visualized ureters and urinary bladder appear unremarkable. Stomach/Bowel: There is diffuse diverticulosis of the descending and sigmoid colon. There is long segment circumferential thickening of the descending and sigmoid colon with stranding of the adjacent fat which may be inflammatory/infectious in  etiology. A colonic mass is not excluded. Overall similar or slight progression of the inflammatory changes compared to the prior CT. Multiple scattered rounded lymph nodes again seen along this segment of the colon as seen on the prior CT. There is a small hiatal hernia. Moderate stool noted throughout the colon. No bowel obstruction. Appendectomy. Vascular/Lymphatic: Moderate aortoiliac atherosclerotic disease. The IVC is unremarkable. No portal venous gas. There is no adenopathy. Reproductive: Hysterectomy. There is a 2.5 cm right adnexal cyst. Other: None Musculoskeletal: Degenerative changes of the spine. No acute osseous pathology. IMPRESSION: 1. Distal colonic diverticulosis with long segment circumferential thickening of the descending and sigmoid colon. Overall similar or slightly progression of inflammatory changes compared to the prior CT. Findings may the inflammatory/infectious in etiology. Underlying colonic mass is not excluded. Clinical correlation and further evaluation with colonoscopy on a nonemergent/outpatient basis and following resolution of acute symptoms recommended. 2. A 2.5 cm right adnexal cyst. Further evaluation with pelvic ultrasound on a nonemergent basis recommended. 3. Aortic Atherosclerosis (ICD10-I70.0). Electronically Signed   By: Anner Crete M.D.   On: 08/12/2020 23:33   DG Chest Portable 1 View  Result Date: 08/12/2020 CLINICAL DATA:  Infection workup EXAM: PORTABLE CHEST 1 VIEW COMPARISON:  04/29/2020 FINDINGS: Cardiomegaly. No confluent airspace opacities, effusions or edema. No acute bony abnormality. IMPRESSION: Cardiomegaly.  No active disease. Electronically Signed   By: Rolm Baptise M.D.   On: 08/12/2020 18:28   [place] EKG SITE RITE  Result Date: 08/13/2020 If Site Rite image not attached, placement could not be confirmed due to current cardiac rhythm.  Impression:   79 year old lady with multiple medical problems admitted with acute encephalopathy.  GI issues  include recalcitrant C. Difficile treat most recently with Dificid.  Follow-up C. difficile assay negative.  Recurrent iron deficiency anemia with low-volume hematochezia.  Known AVMs/erosions on capsule study. Persistently abnormal appearing colon on CT.  Colonoscopy performed about a year and a half ago demonstrated only diverticulosis and a couple of small polyps.  Patient needs further evaluation of her colon.  Further evaluation of right adnexal mass per attending  Recommendations: Plans were to perform a colonoscopy as an outpatient.  Since she is here, we will plan to get this done prior to discharge once her mental status is sorted out and improved.  Clear liquid diet.  Agree with PPI empirically  We will reassess tomorrow morning   Addendum: Discussed with Dr. Manuella Ghazi.  Work-up complete on mental status changes.  He feels it would be appropriate to proceed with a colonoscopy.  Given that assessment, we will go ahead and make plans to perform a colonoscopy tomorrow utilizing propofol. We will discuss with patient's daughter.     Notice:  This dictation was prepared with Dragon dictation along with smaller phrase technology. Any transcriptional errors that result from this process are unintentional and may not be corrected upon review.

## 2020-08-15 ENCOUNTER — Encounter (HOSPITAL_COMMUNITY)
Admission: EM | Disposition: A | Discharge status: Home / Self Care | Payer: Self-pay | Source: Home / Self Care | Attending: Internal Medicine

## 2020-08-15 ENCOUNTER — Encounter (HOSPITAL_COMMUNITY): Payer: Self-pay | Admitting: Anesthesiology

## 2020-08-15 DIAGNOSIS — D509 Iron deficiency anemia, unspecified: Secondary | ICD-10-CM

## 2020-08-15 LAB — BASIC METABOLIC PANEL
Anion gap: 9 (ref 5–15)
BUN: 9 mg/dL (ref 8–23)
CO2: 24 mmol/L (ref 22–32)
Calcium: 7.6 mg/dL — ABNORMAL LOW (ref 8.9–10.3)
Chloride: 103 mmol/L (ref 98–111)
Creatinine, Ser: 0.89 mg/dL (ref 0.44–1.00)
GFR calc Af Amer: >60 mL/min (ref >60)
GFR calc non Af Amer: >60 mL/min (ref >60)
Glucose, Bld: 69 mg/dL — ABNORMAL LOW (ref 70–99)
Potassium: 4.1 mmol/L (ref 3.5–5.1)
Sodium: 136 mmol/L (ref 135–145)

## 2020-08-15 LAB — CBC
HCT: 29.2 % — ABNORMAL LOW (ref 36.0–46.0)
Hemoglobin: 8.9 g/dL — ABNORMAL LOW (ref 12.0–15.0)
MCH: 27.7 pg (ref 26.0–34.0)
MCHC: 30.5 g/dL (ref 30.0–36.0)
MCV: 91 fL (ref 80.0–100.0)
Platelets: 362 10*3/uL (ref 150–400)
RBC: 3.21 MIL/uL — ABNORMAL LOW (ref 3.87–5.11)
RDW: 17.9 % — ABNORMAL HIGH (ref 11.5–15.5)
WBC: 11 10*3/uL — ABNORMAL HIGH (ref 4.0–10.5)
nRBC: 0 % (ref 0.0–0.2)

## 2020-08-15 LAB — MAGNESIUM: Magnesium: 2 mg/dL (ref 1.7–2.4)

## 2020-08-15 LAB — GLUCOSE, CAPILLARY
Glucose-Capillary: 101 mg/dL — ABNORMAL HIGH (ref 70–99)
Glucose-Capillary: 105 mg/dL — ABNORMAL HIGH (ref 70–99)
Glucose-Capillary: 64 mg/dL — ABNORMAL LOW (ref 70–99)
Glucose-Capillary: 65 mg/dL — ABNORMAL LOW (ref 70–99)
Glucose-Capillary: 85 mg/dL (ref 70–99)
Glucose-Capillary: 99 mg/dL (ref 70–99)

## 2020-08-15 LAB — SARS CORONAVIRUS 2 (TAT 6-24 HRS): SARS Coronavirus 2: NEGATIVE

## 2020-08-15 SURGERY — COLONOSCOPY WITH PROPOFOL
Anesthesia: Monitor Anesthesia Care

## 2020-08-15 MED ORDER — PANTOPRAZOLE SODIUM 40 MG IV SOLR
40.0000 mg | Freq: Two times a day (BID) | INTRAVENOUS | Status: DC
  Administered 2020-08-15 – 2020-08-18 (×7): 40 mg via INTRAVENOUS
  Filled 2020-08-15 (×8): qty 40

## 2020-08-15 MED ORDER — ACETAMINOPHEN 325 MG PO TABS
650.0000 mg | ORAL_TABLET | Freq: Four times a day (QID) | ORAL | Status: DC | PRN
  Administered 2020-08-15 – 2020-08-18 (×2): 650 mg via ORAL
  Filled 2020-08-15 (×2): qty 2

## 2020-08-15 MED ORDER — DEXTROSE 50 % IV SOLN
12.5000 g | INTRAVENOUS | Status: AC
  Administered 2020-08-15: 12.5 g via INTRAVENOUS
  Filled 2020-08-15: qty 50

## 2020-08-15 MED ORDER — POLYETHYLENE GLYCOL 3350 17 G PO PACK
17.0000 g | PACK | ORAL | Status: AC
  Administered 2020-08-15 (×3): 17 g via ORAL
  Filled 2020-08-15 (×3): qty 1

## 2020-08-15 NOTE — Progress Notes (Signed)
Pt is very reticent about taking oral laxative. Still sipping on first cup of Miralax given. Have been making frequent stops in room to encourage pt to drink, she states, "I just can't honey. My stomach is griping and I'm just too tired." Reminded pt that MD cannot successfully perform colonoscopy if her bowel is not adequately prepped. Pt has had another incontinent brown liquid stool, not clear with pieces of stool noted.

## 2020-08-15 NOTE — Progress Notes (Signed)
OT Cancellation Note  Patient Details Name: [name] MRN: [id number] DOB: 04/02/1941   Cancelled Treatment:    Reason Eval/Treat Not Completed: Other (comment)  Patient unable to see OT for evaluation this AM as nursing was assisting with toileting and hygiene. Will re-attempt when able.    Ailene Ravel, OTR/L,CBIS  [phone]  08/15/2020, 9:50 AM

## 2020-08-15 NOTE — Progress Notes (Signed)
Pt had large, incontinent liquid stool. Brown in color with noted pieces of stool, no clear liquid noted. PA Roseanne Kaufman notified

## 2020-08-15 NOTE — Plan of Care (Signed)
  Problem: Acute Rehab PT Goals(only PT should resolve) Goal: Pt Will Go Supine/Side To Sit Outcome: Progressing Flowsheets (Taken 08/15/2020 1240) Pt will go Supine/Side to Sit: with minimal assist Goal: Pt Will Go Sit To Supine/Side Outcome: Progressing Flowsheets (Taken 08/15/2020 1240) Pt will go Sit to Supine/Side: with minimal assist Goal: Patient Will Transfer Sit To/From Stand Outcome: Progressing Flowsheets (Taken 08/15/2020 1240) Patient will transfer sit to/from stand: with minimal assist Goal: Pt Will Transfer Bed To Chair/Chair To Bed Outcome: Progressing Flowsheets (Taken 08/15/2020 1240) Pt will Transfer Bed to Chair/Chair to Bed: with min assist Goal: Pt Will Ambulate Outcome: Progressing Flowsheets (Taken 08/15/2020 1240) Pt will Ambulate:  25 feet  with minimal assist  with least restrictive assistive device  12:40 PM, 08/15/20 Mearl Latin PT, DPT Physical Therapist at [facility]

## 2020-08-15 NOTE — TOC Progression Note (Signed)
Transition of Care [facility]) - Progression Note    Patient Details  Name: [name] MRN: [id number] Date of Birth: [birth year]  [facility]) CM/SW Contact  Boneta Lucks, RN Phone Number: 08/15/2020, 1:56 PM  Clinical Narrative:   PT is recommending SNF, TOC spoke with patient and daughter, They agree that would be the best. [facility] first choice. Advise [facility] may not be making bed offers this week. [facility] second choice. TOC referred out for bed offers. TOC to review with daughter.    Expected Discharge Plan: Skilled Nursing Facility Barriers to Discharge: Continued Medical Work up  Expected Discharge Plan and Services Expected Discharge Plan: [place] arrangements for the past 2 months: Single Family Home                   Readmission Risk Interventions Readmission Risk Prevention Plan 07/07/2020  Transportation Screening Complete  Medication Review Press photographer) Complete  PCP or Specialist appointment within 3-5 days of discharge Complete  HRI or [place] Not Complete  SW Recovery Care/Counseling Consult Complete  Palliative Care Screening Not Applicable  Skilled Nursing Facility Complete  Some recent data might be hidden

## 2020-08-15 NOTE — Progress Notes (Addendum)
Subjective: Mild abdominal discomfort lower abdomen. No N/V. Nursing state recent ouput still with solid, small pieces of stool. Prep not adequate for colonoscopy today. No overt GI bleeding.  Objective: Vital signs in last 24 hours: Temp:  [98.4 F (36.9 C)-99 F (37.2 C)] 98.7 F (37.1 C) (08/16 0524) Pulse Rate:  [74-80] 80 (08/16 0524) Resp:  [16-20] 16 (08/16 0524) BP: (155-180)/(64-69) 155/64 (08/16 0524) SpO2:  [97 %-100 %] 97 % (08/16 0524) Last BM Date: 08/15/20 General:   Alert and oriented, pleasant Head:  Normocephalic and atraumatic. Eyes:  No icterus, sclera clear. Conjuctiva pink.  Abdomen:  Bowel sounds present, soft, mild TTP lower abdomen without rebound or guarding Neurologic:  Alert and  oriented x4, flat affect  Intake/Output from previous day: 08/15 0701 - 08/16 0700 In: 605.4 [I.V.:136.7; IV Piggyback:468.6] Out: 1200 [Urine:1200] Intake/Output this shift: No intake/output data recorded.  Lab Results: Recent Labs    08/13/20 0259 08/14/20 0812 08/15/20 0550  WBC 9.8 9.4 11.0*  HGB 9.1* 8.5* 8.9*  HCT 30.8* 27.5* 29.2*  PLT 368 339 362   BMET Recent Labs    08/13/20 0259 08/14/20 0812 08/15/20 0550  NA 139 135 136  K 3.5 2.9* 4.1  CL 106 100 103  CO2 24 25 24   GLUCOSE 99 71 69*  BUN 11 8 9   CREATININE 0.75 0.71 0.89  CALCIUM 8.4* 7.7* 7.6*   LFT Recent Labs    08/12/20 1732 08/13/20 0259 08/14/20 0812  PROT 6.4* 7.1 5.6*  ALBUMIN 2.2* 2.4* 1.9*  AST 19 16 16   ALT 19 21 16   ALKPHOS 94 104 91  BILITOT 0.4 0.3 0.5   PT/INR Recent Labs    08/12/20 1733 08/13/20 0259  LABPROT 15.7* 16.1*  INR 1.3* 1.4*    Assessment: 79 year old female with 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   CT abd/pelvis with contrast 08/12/2020 with long segment circumferential thickening of descending and sigmoid colon, similar or slightly progressed compared to prior CT (June 2021). Last colonoscopy Feb 2020 with simple adenomas, diverticulosis, and internal hemorrhoids. As she has had persistently abnormal CTs and is currently here, it was felt best to pursue colonoscopy prior to discharge. Bowel prep not adequate for colonoscopy today. I have spoken with both daughter and patient regarding the need for an extended prep.   Will plan on colonoscopy with Propofol on 8/17. She is refusing further Golytely; we will order a Miralax prep. Continue with clear liquids for now.    Plan: Stop Protonix infusion Start PPI BID Clear liquids Miralax prep for today NPO after midnight except sips with meds Plans for colonoscopy with Propofol on 8/17: I discussed risks and benefits with both patient and daughter [name]) Right adnexal cyst follow-up as outpatient per PCP: previously MR pelvis had been recommended  Annitta Needs, PhD, ANP-BC [facility]     LOS: 2 days    08/15/2020, 9:23 AM  Agree with the above findings as written. The patient was presented to me by the APP (Advanced Practice Provider) and I have also seen and examined the patient independently. Please see my key portion of the encounter as documented.  Patient continues to have persistent low-volume hematochezia,though her  Hgb has remained stable. We will continue to prep colon further today with MiraLAX prep in anticipation of colonoscopy possibly tomorrow to further evaluate etiology of patient's bleeding and abnormal CT imaging. Further recommendations to follow.

## 2020-08-15 NOTE — Progress Notes (Signed)
  Spoke with patient today at bedside. Plans for colonoscopy with Propofol later this afternoon. No overt GI bleeding. Per day nursing staff, stool report overnight was brown and clear, but they have not seen this personally. She has approximately 1 cup left.  I discussed with staff completing this ASAP and to call me with update regarding next bowel movement.   I touched base with daughter, [name], at [phone]. Reviewed risks and benefits of colonoscopy with both patient and daughter, who are both agreeable.   Annitta Needs, PhD, ANP-BC [facility]

## 2020-08-15 NOTE — Plan of Care (Signed)

## 2020-08-15 NOTE — Care Management Important Message (Signed)
Important Message  Patient Details  Name: [name] MRN: [id number] Date of Birth: May 22, 1941   Medicare Important Message Given:  Yes     Tommy Medal 08/15/2020, 3:54 PM

## 2020-08-15 NOTE — Evaluation (Signed)
Physical Therapy Evaluation Patient Details Name: [name] MRN: [id number] DOB: 09/04/1941 Today's Date: 08/15/2020   History of Present Illness  [name] is a 79 y.o. 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  Patient was unable to provide history due to being somnolent, though easily aroused, history was obtained from daughter by phone and ED physician and ED chart.  Per report, patient was discharged home from a nursing facility 4 days ago, she lives alone (at baseline, she is able to take care of most of her ADLs and maintain normal conversation and daughter checks on her regularly), but patient has been feeling weak since past 3 days, she has had poor appetite and has been more somnolent and drowsy at home.  Daughter activated EMS and she was sent to the ED for further evaluation and management.  Patient was seen by her PCP on 8/12 during which she complained of ongoing rectal bleeding (patient has a pending GI follow-up).  She was admitted to this facility from 7/4-7/8 due to acute blood loss anemia and recurrent C. difficile colitis.    Clinical Impression  Patient limited for functional mobility as stated below secondary to BLE weakness, fatigue and poor standing balance. Patient requires assist for bed mobility to move LE and pull to sitting to transition to seated EOB. Patient moves with slow, labored movements throughout today's session. Patient requires mod assist to transfer to standing and transfers to [facility]. Patient requires assist and RW to remain standing to get cleaned up by RN. Patient limited to ambulating several feet to chair at bedside secondary to fatigue and stomach pain. Patient demonstrates good sitting tolerance on BSC and in chair  at end of session. Patient will benefit from continued physical therapy in hospital and recommended venue below to increase strength, balance, endurance for safe ADLs and gait.     Follow Up Recommendations SNF    Equipment Recommendations  None recommended by PT    Recommendations for Other Services       Precautions / Restrictions Precautions Precautions: Fall Restrictions Weight Bearing Restrictions: No      Mobility  Bed Mobility Overal bed mobility: Needs Assistance Bed Mobility: Supine to Sit     Supine to sit: Mod assist;HOB elevated     General bed mobility comments: Patient requires mod assist for LE movement and to pull to sitting to transition to seated EOB  Transfers Overall transfer level: Needs assistance Equipment used: Rolling walker (2 wheeled) Transfers: Sit to/from [facility] Sit to Stand: Mod assist Stand pivot transfers: Mod assist       General transfer comment: Patient transfer to standing with assist and RW, some verbal cueing for sequencing, labored and slow  Ambulation/Gait Ambulation/Gait assistance: Min assist Gait Distance (Feet): 4 Feet Assistive device: Rolling walker (2 wheeled) Gait Pattern/deviations: Shuffle Gait velocity: decreased   General Gait Details: slow, labored cadence with RW at bedside to ambulate to [facility] and chair  Stairs            Wheelchair Mobility    Modified Rankin (Stroke Patients Only)       Balance Overall balance assessment: Needs assistance Sitting-balance support: No upper extremity supported;Feet supported Sitting balance-Leahy Scale: Fair Sitting balance - Comments: seated EOB   Standing balance support: Bilateral upper extremity supported Standing balance-Leahy  Scale: Poor Standing balance comment: fair/poor with RW                             Pertinent Vitals/Pain Pain Assessment: Faces Faces Pain Scale: Hurts even more Pain Location: stomach Pain  Intervention(s): Limited activity within patient's tolerance;Monitored during session;Repositioned    Home Living Family/patient expects to be discharged to:: Private residence Living Arrangements: Alone Available Help at Discharge: Family;Available PRN/intermittently Type of Home: Apartment Home Access: Stairs to enter Entrance Stairs-Rails: Left Entrance Stairs-Number of Steps: 4 Home Layout: One level Home Equipment: Shower seat;Cane - single point;Walker - 2 wheels      Prior Function Level of Independence: Independent with assistive device(s)         Comments: Patient states household ambulation with SPC and RW, able to complete basic adl independently     Hand Dominance        Extremity/Trunk Assessment   Upper Extremity Assessment Upper Extremity Assessment: Defer to OT evaluation    Lower Extremity Assessment Lower Extremity Assessment: Generalized weakness    Cervical / Trunk Assessment Cervical / Trunk Assessment: Normal  Communication   Communication: No difficulties  Cognition Arousal/Alertness: Awake/alert Behavior During Therapy: WFL for tasks assessed/performed Overall Cognitive Status: Within Functional Limits for tasks assessed                                        General Comments      Exercises     Assessment/Plan    PT Assessment Patient needs continued PT services  PT Problem List Decreased strength;Decreased mobility;Decreased activity tolerance;Decreased balance       PT Treatment Interventions DME instruction;Therapeutic exercise;Gait training;Balance training;Stair training;Neuromuscular re-education;Functional mobility training;Therapeutic activities;Patient/family education    PT Goals (Current goals can be found in the Care Plan section)  Acute Rehab PT Goals Patient Stated Goal: Return home PT Goal Formulation: With patient Time For Goal Achievement: 08/29/20 Potential to Achieve Goals: Fair     Frequency Min 3X/week   Barriers to discharge        Co-evaluation               AM-PAC PT "6 Clicks" Mobility  Outcome Measure Help needed turning from your back to your side while in a flat bed without using bedrails?: A Little Help needed moving from lying on your back to sitting on the side of a flat bed without using bedrails?: A Little Help needed moving to and from a bed to a chair (including a wheelchair)?: A Lot Help needed standing up from a chair using your arms (e.g., wheelchair or bedside chair)?: A Lot Help needed to walk in hospital room?: A Lot Help needed climbing 3-5 steps with a railing? : Total 6 Click Score: 13    End of Session   Activity Tolerance: Patient limited by fatigue;Patient limited by pain Patient left: in chair;with chair alarm set;with call bell/phone within reach Nurse Communication: Mobility status PT Visit Diagnosis: Unsteadiness on feet (R26.81);Other abnormalities of gait and mobility (R26.89);Muscle weakness (generalized) (M62.81)    Time: 9211-9417 PT Time Calculation (min) (ACUTE ONLY): 38 min   Charges:   PT Evaluation $PT Eval Moderate Complexity: 1 Mod PT Treatments $Therapeutic Activity: 23-37 mins       12:39 PM, 08/15/20 Mearl Latin PT, DPT Physical Therapist at [facility]

## 2020-08-15 NOTE — Progress Notes (Signed)
PROGRESS NOTE    [name]  FKC:[id number] DOB: 08-Jan-1941 DOA: 08/12/2020 PCP: Fayrene Helper, MD   Brief Narrative:  Per HPI: Jenna Colgate Gallowayis a 79 y.o.femalewith medical history significant forGERD, constipation, hemorrhoids,iron deficiency anemialikely secondary to small bowel AVMs and erosions,hypertension, hyperlipidemia, diabetes mellitus, obesity who presents to the emergency department due to altered mental status. Patient was unable to provide history due to being somnolent, though easily aroused, history was obtained from daughter by phone and ED physician and ED chart. Per report, patient was discharged home from a nursing facility 4 days ago, she lives alone (at baseline, she is able to take care of most of her ADLs and maintain normal conversation and daughter checks on her regularly), but patient has been feeling weak since past 3 days, she has had poor appetite and has been more somnolent and drowsy at home. Daughter activated EMS and she was sent to the ED for further evaluation and management. Patient was seen by her PCP on 8/12 during which she complained of ongoing rectal bleeding (patient has a pending GI follow-up). She was admitted to this facility from 7/4-7/8 due to acute blood loss anemia and recurrent C. difficile colitis.  -Patient was admitted with acute encephalopathy of unknown origin and thus far, negative work-up. She is also noted to have acute colitis/diverticulitis. Additionally she is noted to have very mild blood loss anemia with positive Hemoccult. GI consulted for evaluation with further recommendations to follow by a.m.  PICC team has placed IV access and patient remains on PPI and Zosyn for now.  8/15: Hemoglobin levels slightly downward trending this morning with no overt bleeding noted.  She appears to be tolerating clear liquid diet.  GI recommendations to continue current diet and PPI.  8/16: Patient was being prepped for  colonoscopy today, but unfortunately prep was inadequate.  Continue with MiraLAX prep tonight and plan for colonoscopy in a.m.  Assessment & Plan:   Principal Problem:   Altered mental status Active Problems:   Type 2 diabetes mellitus (HCC)   Hyperlipemia   Obesity (BMI 30.0-34.9)   IDA (iron deficiency anemia)   GERD   Acute colitis   Type 2 diabetes mellitus with diabetic neuropathy, unspecified (HCC)   Pelvic mass in female   GI bleed   Hyperglycemia   Generalized weakness   Hypoalbuminemia   Transient acute encephalopathy-resolved -Possibly related to acute colitis -Ammonia level within normal limits -Patient is back to baseline level of mentation  Acute colitis/diverticulitis -Continue IV Zosyn -No significant diarrhea or abdominal pain currently noted  Generalized weakness in setting of acute blood loss anemia -Possibly related to acute colitis -Hemoglobin level downward trending, continue to monitor in a.m. -PT/OT evaluation ordered -Continue IV Protonix, now twice daily -Appreciate GI consultation with plans for colonoscopy tomorrow, and adequate prep  Iron deficiency anemia -We will need to consider iron supplementation on discharge -Close follow-up recommended  Myasthenia gravis -Resume home [place]  Anxiety disorder -Resume home medications  GERD -Continue PPI infusion  Type 2 diabetes with hyperglycemia-improved -Continue SSI  Hypoalbuminemia possibly secondary to moderate protein calorie malnutrition -Dietitian consult for supplementation  Obesity -Lifestyle changes  DVT prophylaxis: SCDs Code Status: Full code Family Communication: Discussed with daughter on phone 8/15, GI has discussed with daughter on 8/16 Disposition Plan:   Status is: Inpatient  Remains inpatient appropriate because:Ongoing diagnostic testing needed not appropriate for outpatient work up and IV treatments appropriate due to intensity of illness or  inability to  take PO   Dispo: The patient is from: Home  Anticipated d/c is to: Home  Anticipated d/c date is: 2 days  Patient currently is not medically stable to d/c.    Patient undergoing preparation with plans for colonoscopy in a.m.  Consultants:   GI  Procedures:   See below  Antimicrobials:  Anti-infectives (From admission, onward)   Start     Dose/Rate Route Frequency Ordered Stop   08/13/20 0600  piperacillin-tazobactam (ZOSYN) IVPB 3.375 g     Discontinue     3.375 g 12.5 mL/hr over 240 Minutes Intravenous Every 8 hours 08/13/20 0319 08/23/20 0559   08/13/20 0000  piperacillin-tazobactam (ZOSYN) IVPB 3.375 g        3.375 g 100 mL/hr over 30 Minutes Intravenous  Once 08/12/20 2348 08/13/20 0022       Subjective: Patient seen and evaluated today with no new acute complaints or concerns. No acute concerns or events noted overnight.  She has minimal lower abdominal pain.  No nausea or vomiting noted.  Objective: Vitals:   08/14/20 0449 08/14/20 1320 08/14/20 2105 08/15/20 0524  BP: (!) 179/73 (!) 180/69 (!) 163/66 (!) 155/64  Pulse: 83 80 74 80  Resp: 16 20 16 16   Temp: 98.7 F (37.1 C) 98.4 F (36.9 C) 99 F (37.2 C) 98.7 F (37.1 C)  TempSrc:   Oral Oral  SpO2: 97% 100% 100% 97%  Weight:      Height:        Intake/Output Summary (Last 24 hours) at 08/15/2020 1148 Last data filed at 08/15/2020 1000 Gross per 24 hour  Intake 625.37 ml  Output --  Net 625.37 ml   Filed Weights   08/13/20 1405  Weight: 100.4 kg    Examination:  General exam: Appears calm and comfortable  Respiratory system: Clear to auscultation. Respiratory effort normal. Cardiovascular system: S1 & S2 heard, RRR.  Gastrointestinal system: Abdomen is soft Central nervous system: Alert and awake Extremities: Symmetric 5 x 5 power. Skin: No rashes, lesions or ulcers Psychiatry: Flat affect    Data Reviewed: I have personally reviewed  following labs and imaging studies  CBC: Recent Labs  Lab 08/11/20 1335 08/12/20 1732 08/13/20 0259 08/14/20 0812 08/15/20 0550  WBC 7.9 16.2* 9.8 9.4 11.0*  NEUTROABS  --  13.9*  --   --   --   HGB 8.1* 8.4* 9.1* 8.5* 8.9*  HCT 27.6* 27.8* 30.8* 27.5* 29.2*  MCV 94.8 92.4 94.2 89.3 91.0  PLT 363 369 368 339 299   Basic Metabolic Panel: Recent Labs  Lab 08/11/20 1335 08/12/20 1732 08/13/20 0259 08/14/20 0812 08/15/20 0550  NA 138 136 139 135 136  K 3.6 3.8 3.5 2.9* 4.1  CL 108 106 106 100 103  CO2 22 23 24 25 24   GLUCOSE 149* 178* 99 71 69*  BUN 15 12 11 8 9   CREATININE 0.89 0.94 0.75 0.71 0.89  CALCIUM 8.5* 8.5* 8.4* 7.7* 7.6*  MG  --   --  1.7 1.5* 2.0  PHOS  --   --  3.2  --   --    GFR: Estimated Creatinine Clearance: 63.3 mL/min (by C-G formula based on SCr of 0.89 mg/dL). Liver Function Tests: Recent Labs  Lab 08/12/20 1732 08/13/20 0259 08/14/20 0812  AST 19 16 16   ALT 19 21 16   ALKPHOS 94 104 91  BILITOT 0.4 0.3 0.5  PROT 6.4* 7.1 5.6*  ALBUMIN 2.2* 2.4* 1.9*   No results for  input(s): LIPASE, AMYLASE in the last 168 hours. Recent Labs  Lab 08/12/20 1732  AMMONIA 28   Coagulation Profile: Recent Labs  Lab 08/12/20 1733 08/13/20 0259  INR 1.3* 1.4*   Cardiac Enzymes: No results for input(s): CKTOTAL, CKMB, CKMBINDEX, TROPONINI in the last 168 hours. BNP (last 3 results) No results for input(s): PROBNP in the last 8760 hours. HbA1C: No results for input(s): HGBA1C in the last 72 hours. CBG: Recent Labs  Lab 08/14/20 1649 08/14/20 2110 08/15/20 0723 08/15/20 0815 08/15/20 1126  GLUCAP 90 72 64* 105* 99   Lipid Profile: No results for input(s): CHOL, HDL, LDLCALC, TRIG, CHOLHDL, LDLDIRECT in the last 72 hours. Thyroid Function Tests: No results for input(s): TSH, T4TOTAL, FREET4, T3FREE, THYROIDAB in the last 72 hours. Anemia Panel: No results for input(s): VITAMINB12, FOLATE, FERRITIN, TIBC, IRON, RETICCTPCT in the last 72  hours. Sepsis Labs: Recent Labs  Lab 08/12/20 1733 08/13/20 0045 08/13/20 0259  PROCALCITON 3.42  --   --   LATICACIDVEN  --  1.7 1.7    Recent Results (from the past 240 hour(s))  C Difficile Quick Screen w PCR reflex     Status: None   Collection Time: 08/07/20  8:20 AM  Result Value Ref Range Status   C Diff antigen NEGATIVE NEGATIVE Final   C Diff toxin NEGATIVE NEGATIVE Final   C Diff interpretation No C. difficile detected.  Final    Comment: Performed at [facility], [address], [place], [place] [address]  Urine culture     Status: None   Collection Time: 08/12/20  5:07 PM   Specimen: Urine, Catheterized  Result Value Ref Range Status   Specimen Description   Final    URINE, CATHETERIZED Performed at [facility], [address], [place], [place] [address]    Special Requests   Final    NONE Performed at [facility], [address], [place], [place] [address]    Culture   Final    NO GROWTH Performed at [facility], [place] [address], [place], [place] [address]    Report Status 08/14/2020 FINAL  Final  Blood culture (routine single)     Status: None (Preliminary result)   Collection Time: 08/12/20  5:33 PM   Specimen: BLOOD RIGHT HAND  Result Value Ref Range Status   Specimen Description BLOOD RIGHT HAND  Final   Special Requests   Final    BOTTLES DRAWN AEROBIC AND ANAEROBIC Blood Culture adequate volume   Culture   Final    NO GROWTH 2 DAYS Performed at [facility], [address], [place], [place] [address]    Report Status PENDING  Incomplete  SARS Coronavirus 2 by RT PCR (hospital order, performed in [place] hospital lab) Nasopharyngeal Nasopharyngeal Swab     Status: None   Collection Time: 08/12/20  6:55 PM   Specimen: Nasopharyngeal Swab  Result Value Ref Range Status   SARS Coronavirus 2 NEGATIVE NEGATIVE Final    Comment: (NOTE) SARS-CoV-2 target nucleic acids are NOT DETECTED.  The SARS-CoV-2 RNA is generally detectable in upper and  lower respiratory specimens during the acute phase of infection. The lowest concentration of SARS-CoV-2 viral copies this assay can detect is 250 copies / mL. A negative result does not preclude SARS-CoV-2 infection and should not be used as the sole basis for treatment or other patient management decisions.  A negative result may occur with improper specimen collection / handling, submission of specimen other than  nasopharyngeal swab, presence of viral mutation(s) within the areas targeted by this assay, and inadequate number of viral copies (<250 copies / mL). A negative result must be combined with clinical observations, patient history, and epidemiological information.  Fact Sheet for Patients:   StrictlyIdeas.no  Fact Sheet for Healthcare Providers: BankingDealers.co.za  This test is not yet approved or  cleared by the [place] FDA and has been authorized for detection and/or diagnosis of SARS-CoV-2 by FDA under an Emergency Use Authorization (EUA).  This EUA will remain in effect (meaning this test can be used) for the duration of the COVID-19 declaration under Section 564(b)(1) of the Act, 21 U.S.C. section 360bbb-3(b)(1), unless the authorization is terminated or revoked sooner.  Performed at [facility], [address], [place], [place] [address]          Radiology Studies: No results found.      Scheduled Meds: . brimonidine  1 drop Both Eyes Q12H   And  . timolol  1 drop Both Eyes Q12H  . Chlorhexidine Gluconate Cloth  6 each Topical Daily  . cholecalciferol  2,000 Units Oral QHS  . citalopram  10 mg Oral Daily  . darifenacin  15 mg Oral Daily  . diltiazem  120 mg Oral Daily  . feeding supplement  1 Container Oral TID BM  . fluticasone  2 spray Each Nare Daily  . gabapentin  300 mg Oral BID  . insulin aspart  0-9 Units Subcutaneous TID WC  . loratadine  10 mg Oral Daily  . multivitamin with minerals   1 tablet Oral Daily  . pantoprazole  40 mg Intravenous Q12H  . pyridostigmine  60 mg Oral Q8H  . sodium chloride flush  10-40 mL Intracatheter Q12H  . venlafaxine XR  150 mg Oral Daily   Continuous Infusions: . piperacillin-tazobactam 3.375 g (08/15/20 0529)     LOS: 2 days    Time spent: 30 minutes    Rahul Malinak Darleen Crocker, DO [facility]  If 7PM-7AM, please contact night-coverage [email or web address] 08/15/2020, 11:48 AM

## 2020-08-15 NOTE — NC FL2 (Signed)
[place] LEVEL OF CARE SCREENING TOOL     IDENTIFICATION  Patient Name: [name] Birthdate: [birth year] Sex: female Admission Date (Current Location): 08/12/2020  [facility] and [place] Number:  [employer] and Address:  [place] [address] [place]      Provider Number: [id number]  Attending Physician Name and Address:  Rodena Goldmann, DO  Relative Name and Phone Number:  [name] - Daughter  [phone]    Current Level of Care: Hospital Recommended Level of Care: [place] Prior Approval Number:    Date Approved/Denied:   PASRR Number:    Discharge Plan: SNF    Current Diagnoses: Patient Active Problem List   Diagnosis Date Noted  . Altered mental status 08/13/2020  . Hyperglycemia 08/13/2020  . Generalized weakness 08/13/2020  . Hypoalbuminemia 08/13/2020  . Hospital discharge follow-up 08/12/2020  . Diarrhea 07/15/2020  . GI bleed 07/03/2020  . Abnormal CT scan, colon 07/02/2020  . Normochromic normocytic anemia 06/22/2020  . 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 Chronic non-seasonal allergic rhinitis 06/21/2020  . Type 2 diabetes mellitus with diabetic neuropathy, unspecified ([place]) 06/21/2020  . Hyperlipidemia associated with type 2 diabetes mellitus ([place]) 06/21/2020  . Diabetic peripheral neuropathy associated with type 2 diabetes mellitus ([place]) 06/21/2020  . Urinary urgency 06/21/2020  . Pelvic mass in female 06/21/2020  . Acute blood loss anemia 06/21/2020  . COPD (chronic obstructive pulmonary disease) ([place]) 06/21/2020  . Increased intraocular pressure, bilateral 06/21/2020  . Enteritis due to Clostridium difficile 06/18/2020  . Acute colitis 06/12/2020  . Hypomagnesemia 06/12/2020  . Colitis 06/12/2020  . Osteoarthritis of both knees 05/24/2020  . Elevated alkaline phosphatase measurement  10/21/2019  . Back pain 07/05/2019  . Rectal bleeding 01/08/2019  . Chronic constipation 01/08/2019  . Nausea without vomiting 11/03/2018  . Trigger finger, right index finger   . Monoclonal gammopathy of unknown significance (MGUS) 11/26/2017  . Memory loss of unknown cause 11/10/2017  . Chronic left shoulder pain 09/14/2017  . Anemia in chronic renal disease 12/30/2015  . Morbid obesity ([place]) 04/18/2015  . Myasthenia gravis in remission ([place]) 11/24/2014  . Myasthenia gravis ([place]) 11/16/2014  . Vitamin D deficiency 05/24/2014  . Osteopenia 04/26/2014  . Macular hole of left eye 01/19/2014  . Generalized osteoarthritis 05/25/2013  . Esophageal dysphagia 04/03/2012  . Abnormal chest CT 03/02/2012  . Insomnia 01/02/2012  . Malignant hypertension 05/30/2011  . Schatzki's ring 05/07/2011  . INGROWN TOENAIL 01/10/2011  . Unspecified glaucoma 08/08/2010  . DYSCHROMIA, UNSPECIFIED 08/08/2010  . CKD stage 3 due to type 2 diabetes mellitus ([place]) 09/14/2009  . HIP PAIN, LEFT 06/09/2009  . Urinary incontinence 06/09/2009  . Obesity (BMI 30.0-34.9) 03/06/2009  . IDA (iron deficiency anemia) 10/02/2008  . Hypothyroidism 09/27/2008  . Hyperlipemia 06/30/2008  . Depression with anxiety 06/30/2008  . GERD 06/30/2008  . Obstructive sleep apnea 06/30/2008  . CARPAL TUNNEL SYNDROME 05/19/2008  . SHOULDER PAIN 02/02/2008  . IMPINGEMENT SYNDROME 02/02/2008  . DEGENERATIVE JOINT DISEASE, KNEE 11/24/2007  . Type 2 diabetes mellitus ([place]) 09/09/2007    Orientation RESPIRATION BLADDER Height & Weight     Self, Time, Situation, Place  Normal Incontinent Weight: 100.4 kg Height:  5\' 6"  (167.6 cm)  BEHAVIORAL SYMPTOMS/MOOD NEUROLOGICAL BOWEL NUTRITION STATUS      Incontinent Diet (soft diet)  AMBULATORY STATUS COMMUNICATION OF NEEDS Skin  Extensive Assist Verbally Normal                       Personal Care Assistance Level of Assistance  Bathing, Feeding, Dressing Bathing Assistance:  Limited assistance Feeding assistance: Independent Dressing Assistance: Limited assistance     Functional Limitations Info  Sight, Hearing, Speech Sight Info: Impaired Hearing Info: Adequate      SPECIAL CARE FACTORS FREQUENCY  PT (By licensed PT)     PT Frequency: 5 times a week              Contractures Contractures Info: Not present    Additional Factors Info  Psychotropic, Code Status, Allergies Code Status Info: Full Allergies Info: NKDA Psychotropic Info: Celexa, neurontin, Effexor         Current Medications (08/15/2020):  This is the current hospital active medication list Current Facility-Administered Medications  Medication Dose Route Frequency Provider Last Rate Last Admin  . albuterol (PROVENTIL) (2.5 MG/3ML) 0.083% nebulizer solution 2.5 mg  2.5 mg Nebulization Q6H PRN Manuella Ghazi, Pratik D, DO      . brimonidine (ALPHAGAN) 0.2 % ophthalmic solution 1 drop  1 drop Both Eyes Q12H Shah, Pratik D, DO   1 drop at 08/15/20 1126   And  . timolol (TIMOPTIC) 0.5 % ophthalmic solution 1 drop  1 drop Both Eyes Q12H Shah, Pratik D, DO   1 drop at 08/15/20 1126  . Chlorhexidine Gluconate Cloth 2 % PADS 6 each  6 each Topical Daily Heath Lark D, DO   6 each at 08/14/20 1141  . cholecalciferol (VITAMIN D3) tablet 2,000 Units  2,000 Units Oral QHS Manuella Ghazi, Pratik D, DO   2,000 Units at 08/14/20 2151  . citalopram (CELEXA) tablet 10 mg  10 mg Oral Daily Manuella Ghazi, Pratik D, DO   10 mg at 08/15/20 1125  . darifenacin (ENABLEX) 24 hr tablet 15 mg  15 mg Oral Daily Manuella Ghazi, Pratik D, DO   15 mg at 08/15/20 1125  . diltiazem (CARDIZEM CD) 24 hr capsule 120 mg  120 mg Oral Daily Manuella Ghazi, Pratik D, DO   120 mg at 08/15/20 1125  . feeding supplement (BOOST / RESOURCE BREEZE) liquid 1 Container  1 Container Oral TID BM Manuella Ghazi, Pratik D, DO   1 Container at 08/14/20 1319  . fluticasone (FLONASE) 50 MCG/ACT nasal spray 2 spray  2 spray Each Nare Daily Shah, Pratik D, DO   2 spray at 08/15/20 1126  .  gabapentin (NEURONTIN) capsule 300 mg  300 mg Oral BID Manuella Ghazi, Pratik D, DO   300 mg at 08/15/20 1126  . insulin aspart (novoLOG) injection 0-9 Units  0-9 Units Subcutaneous TID WC Shah, Pratik D, DO      . loratadine (CLARITIN) tablet 10 mg  10 mg Oral Daily Manuella Ghazi, Pratik D, DO   10 mg at 08/15/20 1125  . multivitamin with minerals tablet 1 tablet  1 tablet Oral Daily Manuella Ghazi, Pratik D, DO   1 tablet at 08/15/20 1126  . pantoprazole (PROTONIX) injection 40 mg  40 mg Intravenous Q12H Annitta Needs, NP   40 mg at 08/15/20 1126  . piperacillin-tazobactam (ZOSYN) IVPB 3.375 g  3.375 g Intravenous Q8H Adefeso, Oladapo, DO 12.5 mL/hr at 08/15/20 0529 3.375 g at 08/15/20 0529  . polyethylene glycol (MIRALAX / GLYCOLAX) packet 17 g  17 g Oral Q1H Annitta Needs, NP      . pyridostigmine (MESTINON) tablet 60 mg  60 mg Oral Q8H  Manuella Ghazi, Pratik D, DO   60 mg at 08/14/20 2151  . sodium chloride flush (NS) 0.9 % injection 10-40 mL  10-40 mL Intracatheter Q12H Shah, Pratik D, DO   20 mL at 08/15/20 1000  . sodium chloride flush (NS) 0.9 % injection 10-40 mL  10-40 mL Intracatheter PRN Manuella Ghazi, Pratik D, DO      . venlafaxine XR (EFFEXOR-XR) 24 hr capsule 150 mg  150 mg Oral Daily Manuella Ghazi, Pratik D, DO   150 mg at 08/15/20 1126     Discharge Medications: Please see discharge summary for a list of discharge medications.  Relevant Imaging Results:  Relevant Lab Results:   Additional Information SS# [id number]  Boneta Lucks, RN

## 2020-08-16 ENCOUNTER — Encounter (HOSPITAL_COMMUNITY)
Admission: EM | Disposition: A | Discharge status: Home / Self Care | Payer: Self-pay | Source: Home / Self Care | Attending: Internal Medicine

## 2020-08-16 ENCOUNTER — Inpatient Hospital Stay (HOSPITAL_COMMUNITY): Payer: Medicare Other

## 2020-08-16 DIAGNOSIS — R933 Abnormal findings on diagnostic imaging of other parts of digestive tract: Secondary | ICD-10-CM | POA: Diagnosis not present

## 2020-08-16 DIAGNOSIS — K922 Gastrointestinal hemorrhage, unspecified: Secondary | ICD-10-CM | POA: Diagnosis not present

## 2020-08-16 DIAGNOSIS — D509 Iron deficiency anemia, unspecified: Secondary | ICD-10-CM | POA: Diagnosis not present

## 2020-08-16 LAB — BASIC METABOLIC PANEL
Anion gap: 9 (ref 5–15)
BUN: 12 mg/dL (ref 8–23)
CO2: 24 mmol/L (ref 22–32)
Calcium: 7.3 mg/dL — ABNORMAL LOW (ref 8.9–10.3)
Chloride: 104 mmol/L (ref 98–111)
Creatinine, Ser: 1 mg/dL (ref 0.44–1.00)
GFR calc Af Amer: >60 mL/min (ref >60)
GFR calc non Af Amer: 54 mL/min — ABNORMAL LOW (ref >60)
Glucose, Bld: 67 mg/dL — ABNORMAL LOW (ref 70–99)
Potassium: 3.2 mmol/L — ABNORMAL LOW (ref 3.5–5.1)
Sodium: 137 mmol/L (ref 135–145)

## 2020-08-16 LAB — MAGNESIUM: Magnesium: 1.9 mg/dL (ref 1.7–2.4)

## 2020-08-16 LAB — CBC
HCT: 27.8 % — ABNORMAL LOW (ref 36.0–46.0)
Hemoglobin: 8.3 g/dL — ABNORMAL LOW (ref 12.0–15.0)
MCH: 27.6 pg (ref 26.0–34.0)
MCHC: 29.9 g/dL — ABNORMAL LOW (ref 30.0–36.0)
MCV: 92.4 fL (ref 80.0–100.0)
Platelets: 306 10*3/uL (ref 150–400)
RBC: 3.01 MIL/uL — ABNORMAL LOW (ref 3.87–5.11)
RDW: 17.7 % — ABNORMAL HIGH (ref 11.5–15.5)
WBC: 9.1 10*3/uL (ref 4.0–10.5)
nRBC: 0 % (ref 0.0–0.2)

## 2020-08-16 LAB — GLUCOSE, CAPILLARY
Glucose-Capillary: 65 mg/dL — ABNORMAL LOW (ref 70–99)
Glucose-Capillary: 63 mg/dL — ABNORMAL LOW (ref 70–99)
Glucose-Capillary: 44 mg/dL — CL (ref 70–99)
Glucose-Capillary: 126 mg/dL — ABNORMAL HIGH (ref 70–99)
Glucose-Capillary: 85 mg/dL (ref 70–99)

## 2020-08-16 SURGERY — COLONOSCOPY WITH PROPOFOL
Anesthesia: Monitor Anesthesia Care

## 2020-08-16 MED ORDER — POLYETHYLENE GLYCOL 3350 17 G PO PACK
17.0000 g | PACK | ORAL | Status: AC
  Administered 2020-08-16 (×3): 17 g via ORAL
  Filled 2020-08-16 (×3): qty 1

## 2020-08-16 MED ORDER — POLYETHYLENE GLYCOL 3350 17 G PO PACK
17.0000 g | PACK | ORAL | Status: AC
  Administered 2020-08-16 (×5): 17 g via ORAL
  Filled 2020-08-16 (×5): qty 1

## 2020-08-16 MED ORDER — POTASSIUM CHLORIDE CRYS ER 20 MEQ PO TBCR
40.0000 meq | EXTENDED_RELEASE_TABLET | Freq: Two times a day (BID) | ORAL | Status: AC
  Administered 2020-08-16 (×2): 40 meq via ORAL
  Filled 2020-08-16 (×2): qty 2

## 2020-08-16 NOTE — Progress Notes (Signed)
PROGRESS NOTE    [name]  INO:[id number] DOB: 07-28-1941 DOA: 08/12/2020 PCP: Fayrene Helper, MD   Brief Narrative:  Per HPI: Jenna Wilner Gallowayis a 79 y.o.femalewith medical history significant forGERD, constipation, hemorrhoids,iron deficiency anemialikely secondary to small bowel AVMs and erosions,hypertension, hyperlipidemia, diabetes mellitus, obesity who presents to the emergency department due to altered mental status. Patient was unable to provide history due to being somnolent, though easily aroused, history was obtained from daughter by phone and ED physician and ED chart. Per report, patient was discharged home from a nursing facility 4 days ago, she lives alone (at baseline, she is able to take care of most of her ADLs and maintain normal conversation and daughter checks on her regularly), but patient has been feeling weak since past 3 days, she has had poor appetite and has been more somnolent and drowsy at home. Daughter activated EMS and she was sent to the ED for further evaluation and management. Patient was seen by her PCP on 8/12 during which she complained of ongoing rectal bleeding (patient has a pending GI follow-up). She was admitted to this facility from 7/4-7/8 due to acute blood loss anemia and recurrent C. difficile colitis.  -Patient was admitted with acute encephalopathy of unknown origin and thus far, negative work-up. She is also noted to have acute colitis/diverticulitis. Additionally she is noted to have very mild blood loss anemia with positive Hemoccult. GI consulted for evaluationwith further recommendations to follow by a.m. PICC team has placed IV access and patient remains on PPI and Zosyn for now.  8/15: Hemoglobin levels slightly downward trending this morning with no overt bleeding noted. She appears to be tolerating clear liquid diet. GI recommendations to continue current diet and PPI.  8/16: Patient was being prepped for  colonoscopy today, but unfortunately prep was inadequate.  Continue with MiraLAX prep tonight and plan for colonoscopy in a.m.  8/17: Patient will have to have colonoscopy push to tomorrow as she did not complete her MiraLAX prep overnight.  Likely anticipated discharge after this procedure has been performed.  Assessment & Plan:   Principal Problem:   Altered mental status Active Problems:   Type 2 diabetes mellitus (HCC)   Hyperlipemia   Obesity (BMI 30.0-34.9)   IDA (iron deficiency anemia)   GERD   Acute colitis   Type 2 diabetes mellitus with diabetic neuropathy, unspecified (HCC)   Pelvic mass in female   GI bleed   Hyperglycemia   Generalized weakness   Hypoalbuminemia   Transient acute encephalopathy-resolved -Possibly related to acute colitis -Ammonia level within normal limits -Patient is back to baseline level of mentation  Acute colitis/diverticulitis -Continue IV Zosyn -No significant diarrhea or abdominal pain currently noted  Generalized weakness in setting of acute blood loss anemia -Possibly related to acute colitis -Hemoglobin level downward trending, continue to monitor in a.m. -PT/OT evaluation ordered -Continue IV Protonix, now twice daily -Appreciate GI consultationwith plans for colonoscopy tomorrow, and adequate prep  Iron deficiency anemia -We will need to consider iron supplementation on discharge -Close follow-up recommended  Myasthenia gravis -Resume home [place]  Anxiety disorder -Resume home medications  GERD -Continue PPI BID  Type 2 diabetes with hyperglycemia-improved -Continue SSI  Hypoalbuminemia possibly secondary to moderate protein calorie malnutrition -Dietitian consult for supplementation  Obesity -Lifestyle changes  DVT prophylaxis:SCDs Code Status:Full code Family Communication:Discussed with daughter at bedside on 8/17 Disposition Plan:  Status is: Inpatient  Remains inpatient appropriate  because:Ongoing diagnostic testing needed not appropriate for  outpatient work up and IV treatments appropriate due to intensity of illness or inability to take PO   Dispo: The patient is from:Home Anticipated d/c is HC:[facility] Anticipated d/c date is: 1-2 days Patient currently is not medically stable to d/c.  Patient undergoing preparation with plans for colonoscopy in a.m. Likely dc after, pending study.  Consultants:  GI  Procedures:  See below  Antimicrobials:  Anti-infectives (From admission, onward)   Start     Dose/Rate Route Frequency Ordered Stop   08/13/20 0600  piperacillin-tazobactam (ZOSYN) IVPB 3.375 g     Discontinue     3.375 g 12.5 mL/hr over 240 Minutes Intravenous Every 8 hours 08/13/20 0319 08/23/20 0559   08/13/20 0000  piperacillin-tazobactam (ZOSYN) IVPB 3.375 g        3.375 g 100 mL/hr over 30 Minutes Intravenous  Once 08/12/20 2348 08/13/20 0022       Subjective: Patient seen and evaluated today with no new acute complaints or concerns. No acute concerns or events noted overnight.  Patient apparently refused prep last night.  Objective: Vitals:   08/15/20 1415 08/15/20 1946 08/15/20 2136 08/16/20 0234  BP: (!) 142/72  134/75 (!) 173/59  Pulse: 80  64 66  Resp: 16     Temp: (!) 100.5 F (38.1 C)  98 F (36.7 C) 98.8 F (37.1 C)  TempSrc: Oral  Oral Oral  SpO2: 96% 92% 100% 98%  Weight:      Height:        Intake/Output Summary (Last 24 hours) at 08/16/2020 1321 Last data filed at 08/15/2020 1455 Gross per 24 hour  Intake 120 ml  Output --  Net 120 ml   Filed Weights   08/13/20 1405  Weight: 100.4 kg    Examination:  General exam: Appears calm and comfortable  Respiratory system: Clear to auscultation. Respiratory effort normal. Cardiovascular system: S1 & S2 heard, RRR. Gastrointestinal system: Abdomen is soft and minimally tender to palpation. Central nervous system: Alert and  awake Extremities: No edema Skin: No rashes, lesions or ulcers Psychiatry: Judgement and insight appear normal. Mood & affect appropriate.     Data Reviewed: I have personally reviewed following labs and imaging studies  CBC: Recent Labs  Lab 08/12/20 1732 08/13/20 0259 08/14/20 0812 08/15/20 0550 08/16/20 0611  WBC 16.2* 9.8 9.4 11.0* 9.1  NEUTROABS 13.9*  --   --   --   --   HGB 8.4* 9.1* 8.5* 8.9* 8.3*  HCT 27.8* 30.8* 27.5* 29.2* 27.8*  MCV 92.4 94.2 89.3 91.0 92.4  PLT 369 368 339 362 628   Basic Metabolic Panel: Recent Labs  Lab 08/12/20 1732 08/13/20 0259 08/14/20 0812 08/15/20 0550 08/16/20 0611  NA 136 139 135 136 137  K 3.8 3.5 2.9* 4.1 3.2*  CL 106 106 100 103 104  CO2 23 24 25 24 24   GLUCOSE 178* 99 71 69* 67*  BUN 12 11 8 9 12   CREATININE 0.94 0.75 0.71 0.89 1.00  CALCIUM 8.5* 8.4* 7.7* 7.6* 7.3*  MG  --  1.7 1.5* 2.0 1.9  PHOS  --  3.2  --   --   --    GFR: Estimated Creatinine Clearance: 56.3 mL/min (by C-G formula based on SCr of 1 mg/dL). Liver Function Tests: Recent Labs  Lab 08/12/20 1732 08/13/20 0259 08/14/20 0812  AST 19 16 16   ALT 19 21 16   ALKPHOS 94 104 91  BILITOT 0.4 0.3 0.5  PROT 6.4* 7.1 5.6*  ALBUMIN  2.2* 2.4* 1.9*   No results for input(s): LIPASE, AMYLASE in the last 168 hours. Recent Labs  Lab 08/12/20 1732  AMMONIA 28   Coagulation Profile: Recent Labs  Lab 08/12/20 1733 08/13/20 0259  INR 1.3* 1.4*   Cardiac Enzymes: No results for input(s): CKTOTAL, CKMB, CKMBINDEX, TROPONINI in the last 168 hours. BNP (last 3 results) No results for input(s): PROBNP in the last 8760 hours. HbA1C: No results for input(s): HGBA1C in the last 72 hours. CBG: Recent Labs  Lab 08/15/20 1625 08/15/20 2315 08/15/20 2340 08/16/20 0757 08/16/20 1126  GLUCAP 101* 65* 85 65* 63*   Lipid Profile: No results for input(s): CHOL, HDL, LDLCALC, TRIG, CHOLHDL, LDLDIRECT in the last 72 hours. Thyroid Function Tests: No results  for input(s): TSH, T4TOTAL, FREET4, T3FREE, THYROIDAB in the last 72 hours. Anemia Panel: No results for input(s): VITAMINB12, FOLATE, FERRITIN, TIBC, IRON, RETICCTPCT in the last 72 hours. Sepsis Labs: Recent Labs  Lab 08/12/20 1733 08/13/20 0045 08/13/20 0259  PROCALCITON 3.42  --   --   LATICACIDVEN  --  1.7 1.7    Recent Results (from the past 240 hour(s))  C Difficile Quick Screen w PCR reflex     Status: None   Collection Time: 08/07/20  8:20 AM  Result Value Ref Range Status   C Diff antigen NEGATIVE NEGATIVE Final   C Diff toxin NEGATIVE NEGATIVE Final   C Diff interpretation No C. difficile detected.  Final    Comment: Performed at [facility], [address] [place], [place] [address]  Urine culture     Status: None   Collection Time: 08/12/20  5:07 PM   Specimen: Urine, Catheterized  Result Value Ref Range Status   Specimen Description   Final    URINE, CATHETERIZED Performed at [facility], [address] [place], [place] [address]    Special Requests   Final    NONE Performed at [facility], [address] [place], [place] [address]    Culture   Final    NO GROWTH Performed at [facility], [place] [address], [place], [place] [address]    Report Status 08/14/2020 FINAL  Final  Blood culture (routine single)     Status: None (Preliminary result)   Collection Time: 08/12/20  5:33 PM   Specimen: BLOOD RIGHT HAND  Result Value Ref Range Status   Specimen Description BLOOD RIGHT HAND  Final   Special Requests   Final    BOTTLES DRAWN AEROBIC AND ANAEROBIC Blood Culture adequate volume   Culture   Final    NO GROWTH 2 DAYS Performed at [facility], [address], [place], [place] [address]    Report Status PENDING  Incomplete  SARS Coronavirus 2 by RT PCR (hospital order, performed in [place] hospital lab) Nasopharyngeal Nasopharyngeal Swab     Status: None   Collection Time: 08/12/20  6:55 PM   Specimen: Nasopharyngeal Swab  Result Value Ref  Range Status   SARS Coronavirus 2 NEGATIVE NEGATIVE Final    Comment: (NOTE) SARS-CoV-2 target nucleic acids are NOT DETECTED.  The SARS-CoV-2 RNA is generally detectable in upper and lower respiratory specimens during the acute phase of infection. The lowest concentration of SARS-CoV-2 viral copies this assay can detect is 250 copies / mL. A negative result does not preclude SARS-CoV-2 infection and should not be used as the sole basis for treatment or other patient management decisions.  A negative result may occur with improper specimen  collection / handling, submission of specimen other than nasopharyngeal swab, presence of viral mutation(s) within the areas targeted by this assay, and inadequate number of viral copies (<250 copies / mL). A negative result must be combined with clinical observations, patient history, and epidemiological information.  Fact Sheet for Patients:   StrictlyIdeas.no  Fact Sheet for Healthcare Providers: BankingDealers.co.za  This test is not yet approved or  cleared by the [place] FDA and has been authorized for detection and/or diagnosis of SARS-CoV-2 by FDA under an Emergency Use Authorization (EUA).  This EUA will remain in effect (meaning this test can be used) for the duration of the COVID-19 declaration under Section 564(b)(1) of the Act, 21 U.S.C. section 360bbb-3(b)(1), unless the authorization is terminated or revoked sooner.  Performed at [facility] [address] [place], [place] [address]   SARS CORONAVIRUS 2 (TAT 6-24 HRS) Nasopharyngeal Nasopharyngeal Swab     Status: None   Collection Time: 08/14/20  9:46 PM   Specimen: Nasopharyngeal Swab  Result Value Ref Range Status   SARS Coronavirus 2 NEGATIVE NEGATIVE Final    Comment: (NOTE) SARS-CoV-2 target nucleic acids are NOT DETECTED.  The SARS-CoV-2 RNA is generally detectable in upper and lower respiratory specimens during  the acute phase of infection. Negative results do not preclude SARS-CoV-2 infection, do not rule out co-infections with other pathogens, and should not be used as the sole basis for treatment or other patient management decisions. Negative results must be combined with clinical observations, patient history, and epidemiological information. The expected result is Negative.  Fact Sheet for Patients: SugarRoll.be  Fact Sheet for Healthcare Providers: https://www.woods-mathews.com/  This test is not yet approved or cleared by the [place] FDA and  has been authorized for detection and/or diagnosis of SARS-CoV-2 by FDA under an Emergency Use Authorization (EUA). This EUA will remain  in effect (meaning this test can be used) for the duration of the COVID-19 declaration under Se ction 564(b)(1) of the Act, 21 U.S.C. section 360bbb-3(b)(1), unless the authorization is terminated or revoked sooner.  Performed at [facility], [place] [address] [place],  [address]          Radiology Studies: No results found.      Scheduled Meds: . brimonidine  1 drop Both Eyes Q12H   And  . timolol  1 drop Both Eyes Q12H  . Chlorhexidine Gluconate Cloth  6 each Topical Daily  . cholecalciferol  2,000 Units Oral QHS  . citalopram  10 mg Oral Daily  . darifenacin  15 mg Oral Daily  . diltiazem  120 mg Oral Daily  . feeding supplement  1 Container Oral TID BM  . fluticasone  2 spray Each Nare Daily  . gabapentin  300 mg Oral BID  . insulin aspart  0-9 Units Subcutaneous TID WC  . loratadine  10 mg Oral Daily  . multivitamin with minerals  1 tablet Oral Daily  . pantoprazole  40 mg Intravenous Q12H  . polyethylene glycol  17 g Oral Q1H  . potassium chloride  40 mEq Oral BID  . pyridostigmine  60 mg Oral Q8H  . sodium chloride flush  10-40 mL Intracatheter Q12H  . venlafaxine XR  150 mg Oral Daily   Continuous Infusions: .  piperacillin-tazobactam 3.375 g (08/16/20 0538)     LOS: 3 days    Time spent: 30 minutes    Shynia Daleo Darleen Crocker, DO [facility]  If 7PM-7AM, please contact night-coverage [email or web address] 08/16/2020,  1:21 PM

## 2020-08-16 NOTE — Progress Notes (Signed)
Received message from patients nurse Dorinda Hill. Patient's stool had started to turn clear but last BM was more liquid brown stool. Will plan for additional 5 doses of MiraLAX starting at 6pm.   Will also order 2 enemas for the morning.

## 2020-08-16 NOTE — Progress Notes (Signed)
Refused all personal care and vital signs.

## 2020-08-16 NOTE — Progress Notes (Signed)
Pt refused all po meds including Miralax for scheduled colonoscopy. Pt educated on importance of taking all medications and continued to refused. Mid level notified.

## 2020-08-16 NOTE — Progress Notes (Signed)
Physical Therapy Treatment Patient Details Name: [name] MRN: [id number] DOB: 01-13-1941 Today's Date: 08/16/2020    History of Present Illness [name] is a 79 y.o. 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  Patient was unable to provide history due to being somnolent, though easily aroused, history was obtained from daughter by phone and ED physician and ED chart.  Per report, patient was discharged home from a nursing facility 4 days ago, she lives alone (at baseline, she is able to take care of most of her ADLs and maintain normal conversation and daughter checks on her regularly), but patient has been feeling weak since past 3 days, she has had poor appetite and has been more somnolent and drowsy at home.  Daughter activated EMS and she was sent to the ED for further evaluation and management.  Patient was seen by her PCP on 8/12 during which she complained of ongoing rectal bleeding (patient has a pending GI follow-up).  She was admitted to this facility from 7/4-7/8 due to acute blood loss anemia and recurrent C. difficile colitis.    PT Comments    Pt daughter in room during treatment session, verbalizes pt appears to be closer to baseline and improving cognition per ability to correctly identify self, daughter, date, etc. Pt able to come to sitting EOB and return to supine without physical assistance, only requiring increased time and labored with mobility. Pt cued for therapeutic exercise with good muscle activation. Pt able to ambulate to restroom for bowel movement and complete pericare without physical assistance from therapist, but does require mod A with use of grab bar to power up from low seated toilet. Pt limited in ambulation due to  fatigue with mobility, demonstrating slow, short steps with increased lateral weight-shifting and RW for steadying. Pt returned to supine per pt request; RN in room with continued bowel prep and notified about bowel movement in bathroom. Patient will benefit from continued physical therapy in hospital and recommendations below to increase strength, balance, endurance for safe ADLs and gait.   Follow Up Recommendations  SNF     Equipment Recommendations  None recommended by PT    Recommendations for Other Services       Precautions / Restrictions Precautions Precautions: Fall Restrictions Weight Bearing Restrictions: No    Mobility  Bed Mobility Overal bed mobility: Needs Assistance Bed Mobility: Supine to Sit;Sit to Supine  Supine to sit: Min guard;HOB elevated Sit to supine: Min guard   General bed mobility comments: slow, labored movement to come to sitting at EOB, no physical assist from therapist, cues to straighten out once seated EOB; increased time to lift BLE back into bed without physical assist  Transfers Overall transfer level: Needs assistance Equipment used: Rolling walker (2 wheeled) Transfers: Sit to/from [facility] Sit to Stand: Min guard;Mod assist Stand pivot transfers: Min guard  General transfer comment: min G to power up from EOB and when pivoting using RW for steadying; mod A from low seated toilet with pt using grab bar to assist in rising and cues for pushing through flat feet to engage BLEs  Ambulation/Gait Ambulation/Gait assistance: Min guard Gait Distance (Feet): 30 Feet (15 x2) Assistive device: Rolling walker (2 wheeled) Gait Pattern/deviations: Step-through pattern;Decreased stride length;Wide base of support Gait velocity: decreased   General Gait  Details: short, slow steps from bedside to bathroom then returns from bathroom to bedside, requiring increased time and with increased lateral weight-shiftin with wide BOS, no  unsteadiness or near falls   Stairs             Wheelchair Mobility    Modified Rankin (Stroke Patients Only)       Balance Overall balance assessment: Needs assistance Sitting-balance support: No upper extremity supported;Feet supported Sitting balance-Leahy Scale: Good Sitting balance - Comments: seated EOB   Standing balance support: Bilateral upper extremity supported;During functional activity Standing balance-Leahy Scale: Poor Standing balance comment: reliant on RW       Cognition Arousal/Alertness: Awake/alert Behavior During Therapy: WFL for tasks assessed/performed Overall Cognitive Status: Within Functional Limits for tasks assessed      General Comments: pt A&O x4, states name, DOB, month/year, location, situation adn able to state current president      Exercises General Exercises - Lower Extremity Long Arc Quad: Strengthening;Seated;Both;15 reps Hip Flexion/Marching: Strengthening;Seated;Both;15 reps    General Comments        Pertinent Vitals/Pain Pain Assessment: Faces Faces Pain Scale: Hurts a little bit Pain Location: stomach Pain Descriptors / Indicators: Aching Pain Intervention(s): Limited activity within patient's tolerance;Monitored during session    Home Living                      Prior Function            PT Goals (current goals can now be found in the care plan section) Acute Rehab PT Goals Patient Stated Goal: Return home PT Goal Formulation: With patient Time For Goal Achievement: 08/29/20 Potential to Achieve Goals: Fair Progress towards PT goals: Progressing toward goals    Frequency    Min 3X/week      PT Plan Current plan remains appropriate    Co-evaluation              AM-PAC PT "6 Clicks" Mobility   Outcome Measure  Help needed turning from your back to your side while in a flat bed without using bedrails?: A Little Help needed moving from lying on your back to sitting on the side of  a flat bed without using bedrails?: A Little Help needed moving to and from a bed to a chair (including a wheelchair)?: A Little Help needed standing up from a chair using your arms (e.g., wheelchair or bedside chair)?: A Little Help needed to walk in hospital room?: A Little Help needed climbing 3-5 steps with a railing? : Total 6 Click Score: 16    End of Session Equipment Utilized During Treatment: Gait belt Activity Tolerance: Patient tolerated treatment well Patient left: in bed;with call bell/phone within reach;with nursing/sitter in room;with family/visitor present Nurse Communication: Mobility status;Other (comment) (bowel movement) PT Visit Diagnosis: Unsteadiness on feet (R26.81);Other abnormalities of gait and mobility (R26.89);Muscle weakness (generalized) (M62.81)     Time: 2876-8115 PT Time Calculation (min) (ACUTE ONLY): 27 min  Charges:  $Therapeutic Exercise: 8-22 mins $Therapeutic Activity: 8-22 mins                      Talbot Grumbling PT, DPT 08/16/20, 12:41 PM [phone]

## 2020-08-16 NOTE — Progress Notes (Signed)
Patient refusing remaining two doses of Miralax. Patient has received three in total. Patient has had three BM's since 1900. Last BM light brown, liquid stool. Patient stated that "I'll just do the enemas tomorrow." Midlevel notified.

## 2020-08-16 NOTE — Progress Notes (Addendum)
    Subjective: Refused bowel prep overnight. States she knew the nurse that was working night shift and she doesn't like her, so she refused the prep. Agreeable to prep today. Mild left sided abdominal discomfort. No nausea or vomiting. Had nausea with golytely. Stools are brown liquid. No brbpr.   Objective: Vital signs in last 24 hours: Temp:  [98 F (36.7 C)-100.5 F (38.1 C)] 98.8 F (37.1 C) (08/17 0234) Pulse Rate:  [64-80] 66 (08/17 0234) Resp:  [16] 16 (08/16 1415) BP: (134-173)/(59-75) 173/59 (08/17 0234) SpO2:  [92 %-100 %] 98 % (08/17 0234) Last BM Date: 08/16/20 General:   Alert and oriented, no acute distress.  Head:  Normocephalic and atraumatic. Abdomen:  Bowel sounds present, soft, and non-distended. Mild TTP in LLQ and LUQ. Minimal TTP in epigastric area. No rebound or guarding. No masses appreciated.  Neurologic:  Alert and  oriented x4;  grossly normal neurologically. Psych:  Normal mood and affect.  Intake/Output from previous day: 08/16 0701 - 08/17 0700 In: 1160 [P.O.:1140; I.V.:20] Out: -  Intake/Output this shift: No intake/output data recorded.  Lab Results: Recent Labs    08/14/20 0812 08/15/20 0550 08/16/20 0611  WBC 9.4 11.0* 9.1  HGB 8.5* 8.9* 8.3*  HCT 27.5* 29.2* 27.8*  PLT 339 362 306   BMET Recent Labs    08/14/20 0812 08/15/20 0550 08/16/20 0611  NA 135 136 137  K 2.9* 4.1 3.2*  CL 100 103 104  CO2 25 24 24   GLUCOSE 71 69* 67*  BUN 8 9 12   CREATININE 0.71 0.89 1.00  CALCIUM 7.7* 7.6* 7.3*   LFT Recent Labs    08/14/20 0812  PROT 5.6*  ALBUMIN 1.9*  AST 16  ALT 16  ALKPHOS 91  BILITOT 0.5   Assessment: 79 year old female with history of IDA previously felt related to small bowel AVMs and erosions, prior history of Cdiff in June 2021 with recurrence in July, responding to course of Dificid, persistent low-volume hematochezia with originally plans for outpatient colonoscopy due to persistently abnormal colon on CT,  admitted with transient acute encephalopathy that has resolved.   CT abd/pelvis with contrast 08/12/2020 with long segment circumferential thickening of descending and sigmoid colon, similar or slightly progressed compared to prior CT (June 2021). Last colonoscopy Feb 2020 with simple adenomas, diverticulosis, and internal hemorrhoids. As she has had persistently abnormal CTs, it was felt best to pursue colonoscopy prior to discharge. Hemoglobin has remained stable in the 8 range.   Colon prep was not adequate on 8/16. She was prescribed additional MiraLAX prep but refused prep do to not liking her nurse overnight. She is agreeable to completing MiraLAX prep today. Will plan for additional MiraLAX prep today and push colonoscopy to tomorrow.   Hypokalemia: Potassium 3.2 this morning. Will defer to hospitalists for replacement.   Plan: Clear liquids today. Miralax prep for today NPO after midnight except sips with meds Plan for colonoscopy with Propofol on 8/18.  Continue PPI BID.  Right adnexal cyst follow-up as outpatient per PCP: previously MR pelvis had been recommended.    LOS: 3 days    08/16/2020, 7:48 AM   Aliene Altes, [facility]  Agree with the above findings as written. The patient was presented to me by the APP (Advanced Practice Provider) and I have also seen and examined the patient independently. Please see my key portion of the encounter as documented.  Plan for colonoscopy tomorrow.

## 2020-08-16 NOTE — Progress Notes (Signed)
OT Cancellation Note  Patient Details Name: [name] MRN: [id number] DOB: 08-22-1941   Cancelled Treatment:    Reason Eval/Treat Not Completed: Patient declined, no reason specified. Pt seated at EOB, BSC at bedside, working on prep for colonoscopy. Pt requested OT come back at a later time.  Guadelupe Sabin, OTR/L  [phone] 08/16/2020, 10:09 AM

## 2020-08-17 DIAGNOSIS — E1140 Type 2 diabetes mellitus with diabetic neuropathy, unspecified: Secondary | ICD-10-CM

## 2020-08-17 DIAGNOSIS — K922 Gastrointestinal hemorrhage, unspecified: Secondary | ICD-10-CM | POA: Diagnosis not present

## 2020-08-17 DIAGNOSIS — R933 Abnormal findings on diagnostic imaging of other parts of digestive tract: Secondary | ICD-10-CM | POA: Diagnosis not present

## 2020-08-17 DIAGNOSIS — D500 Iron deficiency anemia secondary to blood loss (chronic): Secondary | ICD-10-CM

## 2020-08-17 LAB — CBC
HCT: 27.6 % — ABNORMAL LOW (ref 36.0–46.0)
Hemoglobin: 8.3 g/dL — ABNORMAL LOW (ref 12.0–15.0)
MCH: 27.6 pg (ref 26.0–34.0)
MCHC: 30.1 g/dL (ref 30.0–36.0)
MCV: 91.7 fL (ref 80.0–100.0)
Platelets: 330 10*3/uL (ref 150–400)
RBC: 3.01 MIL/uL — ABNORMAL LOW (ref 3.87–5.11)
RDW: 17.7 % — ABNORMAL HIGH (ref 11.5–15.5)
WBC: 6.3 10*3/uL (ref 4.0–10.5)
nRBC: 0 % (ref 0.0–0.2)

## 2020-08-17 LAB — GLUCOSE, CAPILLARY
Glucose-Capillary: 75 mg/dL (ref 70–99)
Glucose-Capillary: 88 mg/dL (ref 70–99)
Glucose-Capillary: 101 mg/dL — ABNORMAL HIGH (ref 70–99)
Glucose-Capillary: 120 mg/dL — ABNORMAL HIGH (ref 70–99)

## 2020-08-17 LAB — BASIC METABOLIC PANEL
Anion gap: 8 (ref 5–15)
BUN: 13 mg/dL (ref 8–23)
CO2: 23 mmol/L (ref 22–32)
Calcium: 7.4 mg/dL — ABNORMAL LOW (ref 8.9–10.3)
Chloride: 103 mmol/L (ref 98–111)
Creatinine, Ser: 1.21 mg/dL — ABNORMAL HIGH (ref 0.44–1.00)
GFR calc Af Amer: 50 mL/min — ABNORMAL LOW (ref >60)
GFR calc non Af Amer: 43 mL/min — ABNORMAL LOW (ref >60)
Glucose, Bld: 74 mg/dL (ref 70–99)
Potassium: 3.1 mmol/L — ABNORMAL LOW (ref 3.5–5.1)
Sodium: 134 mmol/L — ABNORMAL LOW (ref 135–145)

## 2020-08-17 LAB — MAGNESIUM: Magnesium: 2 mg/dL (ref 1.7–2.4)

## 2020-08-17 MED ORDER — POTASSIUM CHLORIDE CRYS ER 20 MEQ PO TBCR
40.0000 meq | EXTENDED_RELEASE_TABLET | Freq: Once | ORAL | Status: AC
  Administered 2020-08-17: 40 meq via ORAL
  Filled 2020-08-17: qty 2

## 2020-08-17 MED ORDER — LINACLOTIDE 145 MCG PO CAPS
290.0000 ug | ORAL_CAPSULE | Freq: Once | ORAL | Status: AC
  Administered 2020-08-17: 290 ug via ORAL
  Filled 2020-08-17: qty 2

## 2020-08-17 MED ORDER — POTASSIUM CHLORIDE CRYS ER 20 MEQ PO TBCR
40.0000 meq | EXTENDED_RELEASE_TABLET | Freq: Once | ORAL | Status: AC
  Administered 2020-08-17: 40 meq via ORAL

## 2020-08-17 MED ORDER — POTASSIUM CHLORIDE CRYS ER 20 MEQ PO TBCR
40.0000 meq | EXTENDED_RELEASE_TABLET | Freq: Two times a day (BID) | ORAL | Status: AC
  Administered 2020-08-17 – 2020-08-18 (×2): 40 meq via ORAL
  Filled 2020-08-17 (×4): qty 2

## 2020-08-17 NOTE — Progress Notes (Signed)
OT Cancellation Note  Patient Details Name: [name] MRN: [id number] DOB: 1941/06/27   Cancelled Treatment:    Reason Eval/Treat Not Completed: Other (comment). Pt declining participation stating she has been up most of the night with her colonoscopy preparation. Pt declining to participate in therapy this am, states she just needs to get everything finished with. Will check back on patient once more tomorrow if still admitted.   Guadelupe Sabin, OTR/L  [phone] 08/17/2020, 8:19 AM

## 2020-08-17 NOTE — Progress Notes (Signed)
Patient given tap water enema. Pt tolerated well. Stool light brown, liquid.

## 2020-08-17 NOTE — Progress Notes (Signed)
CRITICAL VALUE ALERT  Critical Value:  Blood culture anaerobic bottle positive for gram + rods  Date & Time Notied:  08/17/2020 @ 0940  Provider Notified: Memon  Orders Received/Actions taken: Awaiting orders.

## 2020-08-17 NOTE — Progress Notes (Addendum)
Subjective:  No complaints today. Per nursing, stools clear after second enema.   Objective: Vital signs in last 24 hours: Temp:  [98.2 F (36.8 C)-98.8 F (37.1 C)] 98.2 F (36.8 C) (08/18 0440) Pulse Rate:  [61-70] 70 (08/18 0440) Resp:  [18] 18 (08/18 0440) BP: (121-145)/(49-67) 121/64 (08/18 0440) SpO2:  [96 %-100 %] 96 % (08/18 0932) Last BM Date: 08/16/20 General:   Alert,  Well-developed, well-nourished, pleasant and cooperative in NAD Head:  Normocephalic and atraumatic. Eyes:  Sclera clear, no icterus.  Abdomen:  Soft, nontender and nondistended. Normal bowel sounds, without guarding, and without rebound.   Neurologic:  Alert and  oriented x4;  grossly normal neurologically. Skin:  Intact without significant lesions or rashes. Psych:  Alert and cooperative. Normal mood and affect.  Intake/Output from previous day: 08/17 0701 - 08/18 0700 In: 530 [P.O.:480; IV Piggyback:50] Out: -  Intake/Output this shift: Total I/O In: 20 [I.V.:20] Out: -   Lab Results: CBC Recent Labs    08/15/20 0550 08/16/20 0611 08/17/20 0508  WBC 11.0* 9.1 6.3  HGB 8.9* 8.3* 8.3*  HCT 29.2* 27.8* 27.6*  MCV 91.0 92.4 91.7  PLT 362 306 330   BMET Recent Labs    08/15/20 0550 08/16/20 0611 08/17/20 0508  NA 136 137 134*  K 4.1 3.2* 3.1*  CL 103 104 103  CO2 24 24 23   GLUCOSE 69* 67* 74  BUN 9 12 13   CREATININE 0.89 1.00 1.21*  CALCIUM 7.6* 7.3* 7.4*   LFTs No results for input(s): BILITOT, BILIDIR, IBILI, ALKPHOS, AST, ALT, PROT, ALBUMIN in the last 72 hours. No results for input(s): LIPASE in the last 72 hours. PT/INR No results for input(s): LABPROT, INR in the last 72 hours.    Imaging Studies: CT Head Wo Contrast  Result Date: 08/12/2020 CLINICAL DATA:  Mental status change, unknown cause. Additional history provided: Confusion. EXAM: CT HEAD WITHOUT CONTRAST TECHNIQUE: Contiguous axial images were obtained from the base of the skull through the vertex without  intravenous contrast. COMPARISON:  Brain MRI 05/18/2013 FINDINGS: Brain: Mild generalized parenchymal atrophy. Mild ill-defined hypoattenuation within the cerebral white matter is nonspecific, but consistent with chronic small vessel ischemic disease. Findings are similar as compared to the MRI of 05/18/2013. There is no acute intracranial hemorrhage. No demarcated cortical infarct is identified. No extra-axial fluid collection. No evidence of intracranial mass. No midline shift. Partially empty sella turcica. Vascular: No hyperdense vessel.  Atherosclerotic calcifications. Skull: Normal. Negative for fracture or focal lesion. Sinuses/Orbits: Visualized orbits show no acute finding. Sphenoid sinus mucosal thickening, moderate on the right, mild on the left). Bilateral mastoid effusions. IMPRESSION: No CT evidence of acute intracranial abnormality. Mild generalized parenchymal atrophy and chronic small vessel ischemic disease, without appreciable change as compared to the MRI of 05/18/2013. Sphenoid sinusitis. Bilateral mastoid effusions. Electronically Signed   By: Kellie Simmering DO   On: 08/12/2020 18:07   CT ABDOMEN PELVIS W CONTRAST  Result Date: 08/12/2020 CLINICAL DATA:  79 year old female with abdominal pain and fever. EXAM: CT ABDOMEN AND PELVIS WITH CONTRAST TECHNIQUE: Multidetector CT imaging of the abdomen and pelvis was performed using the standard protocol following bolus administration of intravenous contrast. CONTRAST:  175mL OMNIPAQUE IOHEXOL 300 MG/ML  SOLN COMPARISON:  CT abdomen pelvis dated 06/12/2020. FINDINGS: Lower chest: There are bibasilar dependent atelectatic changes. There is mild cardiomegaly. Three vessel coronary vascular calcification. There is no intra-abdominal free air or free fluid. Hepatobiliary: Probable mild fatty liver. No intrahepatic biliary  ductal dilatation. Cholecystectomy. No retained calcified stone noted in the central CBD. Pancreas: Unremarkable. No pancreatic ductal  dilatation or surrounding inflammatory changes. Spleen: Normal in size without focal abnormality. Adrenals/Urinary Tract: The adrenal glands unremarkable. There is mild bilateral renal parenchyma atrophy and cortical irregularity and scarring. Small bilateral renal cysts and subcentimeter hypodensities which are too small to characterize. There is no hydronephrosis on either side. There is symmetric enhancement and excretion of contrast by both kidneys. The visualized ureters and urinary bladder appear unremarkable. Stomach/Bowel: There is diffuse diverticulosis of the descending and sigmoid colon. There is long segment circumferential thickening of the descending and sigmoid colon with stranding of the adjacent fat which may be inflammatory/infectious in etiology. A colonic mass is not excluded. Overall similar or slight progression of the inflammatory changes compared to the prior CT. Multiple scattered rounded lymph nodes again seen along this segment of the colon as seen on the prior CT. There is a small hiatal hernia. Moderate stool noted throughout the colon. No bowel obstruction. Appendectomy. Vascular/Lymphatic: Moderate aortoiliac atherosclerotic disease. The IVC is unremarkable. No portal venous gas. There is no adenopathy. Reproductive: Hysterectomy. There is a 2.5 cm right adnexal cyst. Other: None Musculoskeletal: Degenerative changes of the spine. No acute osseous pathology. IMPRESSION: 1. Distal colonic diverticulosis with long segment circumferential thickening of the descending and sigmoid colon. Overall similar or slightly progression of inflammatory changes compared to the prior CT. Findings may the inflammatory/infectious in etiology. Underlying colonic mass is not excluded. Clinical correlation and further evaluation with colonoscopy on a nonemergent/outpatient basis and following resolution of acute symptoms recommended. 2. A 2.5 cm right adnexal cyst. Further evaluation with pelvic ultrasound  on a nonemergent basis recommended. 3. Aortic Atherosclerosis (ICD10-I70.0). Electronically Signed   By: Anner Crete M.D.   On: 08/12/2020 23:33   DG Chest Portable 1 View  Result Date: 08/12/2020 CLINICAL DATA:  Infection workup EXAM: PORTABLE CHEST 1 VIEW COMPARISON:  04/29/2020 FINDINGS: Cardiomegaly. No confluent airspace opacities, effusions or edema. No acute bony abnormality. IMPRESSION: Cardiomegaly.  No active disease. Electronically Signed   By: Rolm Baptise M.D.   On: 08/12/2020 18:28   [place] EKG SITE RITE  Result Date: 08/13/2020 If Site Rite image not attached, placement could not be confirmed due to current cardiac rhythm. [2 weeks]   Assessment: 79 year old female with history of IDA previously felt related to small bowel AVMs and erosions, prior history of C. difficile in June 2021 with recurrence in July, responding to course of Dificid, persistent low-volume hematochezia with original plans for outpatient colonoscopy due to persistently abnormal colon on CT, admitted with transient acute encephalopathy that has resolved.  CT abdomen pelvis with contrast August 13 with long segment circumferential thickening of the descending and sigmoid colon, similar or slightly progressed compared to prior CT in June.  Last colonoscopy February 2020 with simple adenomas, diverticulosis, internal hemorrhoids.  Hemoglobin has remained stable in the 8 range.  Because of persistently abnormal CTs, colonoscopy recommended prior to discharge.  Colonoscopy has been delayed due to poor bowel prep and patient compliance.  This morning after second enema, passing clear watery stool. Plans for colonoscopy tomorrow.   Positive blood culture from culture on 8/13. Possible contaminate. Has been on Zosyn. Clinically stable. Discussed with Dr. Roderic Palau.   Plan: Continue clear liquid diet, n.p.o. after midnight. Colonoscopy with propofol, ASA III, 8/19 at 7:30am due to schedule change today with emergent  cases. Patient aware. Replete potassium today. One tap water enema in  AM. Work up of right adnexal cyst per PCP as outpatient. Patient missed her MRI pelvis last week due to illness.  Laureen Ochs. Bernarda Caffey [facility] [phone] 8/18/202110:45 AM     LOS: 4 days    Agree with the above findings as written. The patient was presented to me by the APP (Advanced Practice Provider) and I have also seen and examined the patient independently. Please see my key portion of the encounter as documented.  Plan for colonoscopy in the AM.

## 2020-08-17 NOTE — Progress Notes (Signed)
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 Patient was unable to provide history due to being somnolent, though easily aroused, history was obtained from daughter by phone and ED physician and ED chart. Per report, patient was discharged home from a nursing facility 4 days ago, she lives alone (at baseline, she is able to take care of most of her ADLs and maintain normal conversation and daughter checks on her regularly), but patient has been feeling weak since past 3 days, she has had poor appetite and has been more somnolent and drowsy at home. Daughter activated EMS and she was sent to the ED for further evaluation and management. Patient was seen by her PCP on 8/12 during which she complained of ongoing rectal bleeding (patient has a pending GI follow-up). She was admitted to this facility from 7/4-7/8 due to acute blood loss anemia and recurrent C. difficile colitis.  -Patient was admitted with acute encephalopathy of unknown origin and thus far, negative work-up. She is also noted to have acute colitis/diverticulitis. Additionally she is noted to have very mild blood loss anemia with positive Hemoccult. GI consulted for evaluationwith further recommendations to follow by a.m. PICC team has placed IV access and patient remains on PPI and Zosyn for now.    Assessment & Plan:   Principal Problem:   Altered mental status Active Problems:   Type 2 diabetes mellitus (HCC)   Hyperlipemia   Obesity (BMI 30.0-34.9)   IDA (iron deficiency anemia)   GERD   Acute colitis   Type 2  diabetes mellitus with diabetic neuropathy, unspecified (HCC)   Pelvic mass in female   GI bleed   Hyperglycemia   Generalized weakness   Hypoalbuminemia   Transient acute encephalopathy-resolved -Possibly related to acute colitis -Ammonia level within normal limits -Patient is back to baseline level of mentation  Acute colitis/diverticulitis -Continue IV Zosyn -No significant diarrhea or abdominal pain currently noted  Generalized weakness in setting of acute blood loss anemia -Possibly related to acute colitis -Hemoglobin level downward trending, continue to monitor in a.m. -PT/OT evaluation ordered -Continue IV Protonix, now twice daily -Appreciate GI consultationwith plans for colonoscopy tomorrow, and adequate prep  Iron deficiency anemia -We will need to consider iron supplementation on discharge -Close follow-up recommended  Myasthenia gravis -Resume home [place]  Anxiety disorder -Resume home medications  GERD -Continue PPI BID  Type 2 diabetes with hyperglycemia-improved -Continue SSI  Hypoalbuminemia possibly secondary to moderate protein calorie malnutrition -Dietitian consult for supplementation  Obesity -Lifestyle changes  DVT prophylaxis:SCDs Code Status:Full code Family Communication:Discussed with daughter over the phone 8/18 Disposition Plan:  Status is: Inpatient  Remains inpatient appropriate because:Ongoing diagnostic testing needed not appropriate for outpatient work up and IV treatments appropriate due to intensity of illness or inability to take PO   Dispo: The patient is from:Home Anticipated d/c is to:SNF Anticipated d/c date is: 1-2 days Patient currently is not medically stable to d/c.  Patient undergoing preparation with plans for colonoscopy in a.m. Likely dc after, pending study.  Consultants:  GI  Procedures:  See below  Antimicrobials:   Anti-infectives (From admission, onward)   Start     Dose/Rate Route Frequency Ordered  Stop   08/13/20 0600  piperacillin-tazobactam (ZOSYN) IVPB 3.375 g     Discontinue     3.375 g 12.5 mL/hr over 240 Minutes Intravenous Every 8 hours 08/13/20 0319 08/23/20 0559   08/13/20 0000  piperacillin-tazobactam (ZOSYN) IVPB 3.375 g        3.375 g 100 mL/hr over 30 Minutes Intravenous  Once 08/12/20 2348 08/13/20 0022      Subjective: Denies any abdominal pain, nausea or vomiting.  Has not had any blood in her stools.  Objective: Vitals:   08/17/20 0440 08/17/20 0932 08/17/20 1305 08/17/20 1306  BP: 121/64  (!) 124/97 (!) 124/97  Pulse: 70  63 63  Resp: 18  18 18   Temp: 98.2 F (36.8 C)   98.1 F (36.7 C)  TempSrc: Oral     SpO2: 97% 96% (!) 88% (!) 88%  Weight:      Height:        Intake/Output Summary (Last 24 hours) at 08/17/2020 2033 Last data filed at 08/17/2020 1520 Gross per 24 hour  Intake 854.93 ml  Output --  Net 854.93 ml   Filed Weights   08/13/20 1405  Weight: 100.4 kg    Examination:  General exam: Appears calm and comfortable  Respiratory system: Clear to auscultation. Respiratory effort normal. Cardiovascular system: S1 & S2 heard, RRR. Gastrointestinal system: Abdomen is soft and minimally tender to palpation. Central nervous system: Alert and awake Extremities: No edema Skin: No rashes, lesions or ulcers Psychiatry: Judgement and insight appear normal. Mood & affect appropriate.     Data Reviewed: I have personally reviewed following labs and imaging studies  CBC: Recent Labs  Lab 08/12/20 1732 08/12/20 1732 08/13/20 0259 08/14/20 0812 08/15/20 0550 08/16/20 0611 08/17/20 0508  WBC 16.2*   < > 9.8 9.4 11.0* 9.1 6.3  NEUTROABS 13.9*  --   --   --   --   --   --   HGB 8.4*   < > 9.1* 8.5* 8.9* 8.3* 8.3*  HCT 27.8*   < > 30.8* 27.5* 29.2* 27.8* 27.6*  MCV 92.4   < > 94.2 89.3 91.0 92.4 91.7  PLT 369   < > 368 339 362 306 330   < > = values  in this interval not displayed.   Basic Metabolic Panel: Recent Labs  Lab 08/13/20 0259 08/14/20 0812 08/15/20 0550 08/16/20 0611 08/17/20 0508  NA 139 135 136 137 134*  K 3.5 2.9* 4.1 3.2* 3.1*  CL 106 100 103 104 103  CO2 24 25 24 24 23   GLUCOSE 99 71 69* 67* 74  BUN 11 8 9 12 13   CREATININE 0.75 0.71 0.89 1.00 1.21*  CALCIUM 8.4* 7.7* 7.6* 7.3* 7.4*  MG 1.7 1.5* 2.0 1.9 2.0  PHOS 3.2  --   --   --   --    GFR: Estimated Creatinine Clearance: 46.5 mL/min (A) (by C-G formula based on SCr of 1.21 mg/dL (H)). Liver Function Tests: Recent Labs  Lab 08/12/20 1732 08/13/20 0259 08/14/20 0812  AST 19 16 16   ALT 19 21 16   ALKPHOS 94 104 91  BILITOT 0.4 0.3 0.5  PROT 6.4* 7.1 5.6*  ALBUMIN 2.2* 2.4* 1.9*   No results for input(s): LIPASE, AMYLASE in the last 168 hours. Recent Labs  Lab 08/12/20 1732  AMMONIA 28   Coagulation Profile: Recent Labs  Lab 08/12/20 1733 08/13/20 0259  INR 1.3* 1.4*   Cardiac Enzymes: No results for input(s): CKTOTAL, CKMB,  CKMBINDEX, TROPONINI in the last 168 hours. BNP (last 3 results) No results for input(s): PROBNP in the last 8760 hours. HbA1C: No results for input(s): HGBA1C in the last 72 hours. CBG: Recent Labs  Lab 08/16/20 1800 08/16/20 2158 08/17/20 0731 08/17/20 1128 08/17/20 1650  GLUCAP 126* 85 75 88 101*   Lipid Profile: No results for input(s): CHOL, HDL, LDLCALC, TRIG, CHOLHDL, LDLDIRECT in the last 72 hours. Thyroid Function Tests: No results for input(s): TSH, T4TOTAL, FREET4, T3FREE, THYROIDAB in the last 72 hours. Anemia Panel: No results for input(s): VITAMINB12, FOLATE, FERRITIN, TIBC, IRON, RETICCTPCT in the last 72 hours. Sepsis Labs: Recent Labs  Lab 08/12/20 1733 08/13/20 0045 08/13/20 0259  PROCALCITON 3.42  --   --   LATICACIDVEN  --  1.7 1.7    Recent Results (from the past 240 hour(s))  Urine culture     Status: None   Collection Time: 08/12/20  5:07 PM   Specimen: Urine,  Catheterized  Result Value Ref Range Status   Specimen Description   Final    URINE, CATHETERIZED Performed at [facility], [address] [place], [place] [address]    Special Requests   Final    NONE Performed at [facility], [address] [place], [place] [address]    Culture   Final    NO GROWTH Performed at [facility], [place] [address] [place], [place] [address]    Report Status 08/14/2020 FINAL  Final  Blood culture (routine single)     Status: None (Preliminary result)   Collection Time: 08/12/20  5:33 PM   Specimen: BLOOD RIGHT HAND  Result Value Ref Range Status   Specimen Description   Final    BLOOD RIGHT HAND Performed at [facility], [address], [place], [place] [address]    Special Requests   Final    BOTTLES DRAWN AEROBIC AND ANAEROBIC Blood Culture adequate volume Performed at [facility], [address] [place], [place] [address]    Culture  Setup Time   Final    GRAM POSITIVE RODS ANAEROBIC BOTTLE Gram Stain Report Called to,Read Back By and Verified With: DEAN,T@0935  BY [name], B @ 8.18.2021 [facility] Performed at [facility], [address], [place],  [address]    Culture [place]  Final   Report Status PENDING  Incomplete  SARS Coronavirus 2 by RT PCR (hospital order, performed in [facility] hospital lab) Nasopharyngeal Nasopharyngeal Swab     Status: None   Collection Time: 08/12/20  6:55 PM   Specimen: Nasopharyngeal Swab  Result Value Ref Range Status   SARS Coronavirus 2 NEGATIVE NEGATIVE Final    Comment: (NOTE) SARS-CoV-2 target nucleic acids are NOT DETECTED.  The SARS-CoV-2 RNA is generally detectable in upper and lower respiratory specimens during the acute phase of infection. The lowest concentration of SARS-CoV-2 viral copies this assay can detect is 250 copies / mL. A negative result does not preclude SARS-CoV-2 infection and should not be used as the sole basis for treatment or other patient  management decisions.  A negative result may occur with improper specimen collection / handling, submission of specimen other than nasopharyngeal swab, presence of viral mutation(s) within the areas targeted by this assay, and inadequate number of viral copies (<250 copies / mL). A negative result must be combined with clinical observations, patient history, and epidemiological information.  Fact Sheet for Patients:   StrictlyIdeas.no  Fact Sheet for Healthcare Providers: BankingDealers.co.za  This  test is not yet approved or  cleared by the [place] and has been authorized for detection and/or diagnosis of SARS-CoV-2 by FDA under an Emergency Use Authorization (EUA).  This EUA will remain in effect (meaning this test can be used) for the duration of the COVID-19 declaration under Section 564(b)(1) of the Act, 21 U.S.C. section 360bbb-3(b)(1), unless the authorization is terminated or revoked sooner.  Performed at [facility], [address], [place], [place] [address]   SARS CORONAVIRUS 2 (TAT 6-24 HRS) Nasopharyngeal Nasopharyngeal Swab     Status: None   Collection Time: 08/14/20  9:46 PM   Specimen: Nasopharyngeal Swab  Result Value Ref Range Status   SARS Coronavirus 2 NEGATIVE NEGATIVE Final    Comment: (NOTE) SARS-CoV-2 target nucleic acids are NOT DETECTED.  The SARS-CoV-2 RNA is generally detectable in upper and lower respiratory specimens during the acute phase of infection. Negative results do not preclude SARS-CoV-2 infection, do not rule out co-infections with other pathogens, and should not be used as the sole basis for treatment or other patient management decisions. Negative results must be combined with clinical observations, patient history, and epidemiological information. The expected result is Negative.  Fact Sheet for Patients: SugarRoll.be  Fact Sheet for Healthcare  Providers: https://www.woods-mathews.com/  This test is not yet approved or cleared by the [place] FDA and  has been authorized for detection and/or diagnosis of SARS-CoV-2 by FDA under an Emergency Use Authorization (EUA). This EUA will remain  in effect (meaning this test can be used) for the duration of the COVID-19 declaration under Se ction 564(b)(1) of the Act, 21 U.S.C. section 360bbb-3(b)(1), unless the authorization is terminated or revoked sooner.  Performed at [facility], [place] [address] [place], [place] [address]          Radiology Studies: No results found.      Scheduled Meds: . brimonidine  1 drop Both Eyes Q12H   And  . timolol  1 drop Both Eyes Q12H  . Chlorhexidine Gluconate Cloth  6 each Topical Daily  . cholecalciferol  2,000 Units Oral QHS  . citalopram  10 mg Oral Daily  . darifenacin  15 mg Oral Daily  . diltiazem  120 mg Oral Daily  . feeding supplement  1 Container Oral TID BM  . fluticasone  2 spray Each Nare Daily  . gabapentin  300 mg Oral BID  . insulin aspart  0-9 Units Subcutaneous TID WC  . loratadine  10 mg Oral Daily  . multivitamin with minerals  1 tablet Oral Daily  . pantoprazole  40 mg Intravenous Q12H  . potassium chloride  40 mEq Oral BID  . pyridostigmine  60 mg Oral Q8H  . sodium chloride flush  10-40 mL Intracatheter Q12H  . venlafaxine XR  150 mg Oral Daily   Continuous Infusions: . piperacillin-tazobactam 12.5 mL/hr at 08/17/20 1520     LOS: 4 days    Time spent: 30 minutes    Kathie Dike, MD [facility]  If 7PM-7AM, please contact night-coverage [email or web address] 08/17/2020, 8:33 PM

## 2020-08-17 NOTE — TOC Progression Note (Signed)
Transition of Care [facility]) - Progression Note    Patient Details  Name: [name] MRN: [id number] Date of Birth: [birth year]  [facility] CM/SW Contact  Boneta Lucks, RN Phone Number: 08/17/2020, 11:12 AM  Clinical Narrative:   Ship broker received [id number], for [facility].  Patient will not be medically ready until tomorrow. [facility] updated. INS AUTH exp 8/19    Expected Discharge Plan: Skilled Nursing Facility Barriers to Discharge: Continued Medical Work up  Expected Discharge Plan and Services Expected Discharge Plan: [place] arrangements for the past 2 months: Single Family Home     Readmission Risk Interventions Readmission Risk Prevention Plan 07/07/2020  Transportation Screening Complete  Medication Review Press photographer) Complete  PCP or Specialist appointment within 3-5 days of discharge Complete  HRI or [place] Not Complete  SW Recovery Care/Counseling Consult Complete  Palliative Care Screening Not Applicable  Skilled Nursing Facility Complete  Some recent data might be hidden

## 2020-08-17 NOTE — Care Management Important Message (Signed)
Important Message  Patient Details  Name: [name] MRN: [id number] Date of Birth: 1941-06-29   Medicare Important Message Given:  Yes     Tommy Medal 08/17/2020, 4:13 PM

## 2020-08-18 ENCOUNTER — Inpatient Hospital Stay (HOSPITAL_COMMUNITY): Payer: Medicare Other | Admitting: Anesthesiology

## 2020-08-18 ENCOUNTER — Encounter (HOSPITAL_COMMUNITY)
Admission: EM | Disposition: A | Discharge status: Home / Self Care | Payer: Self-pay | Source: Home / Self Care | Attending: Internal Medicine

## 2020-08-18 ENCOUNTER — Encounter (HOSPITAL_COMMUNITY): Payer: Self-pay | Admitting: Internal Medicine

## 2020-08-18 DIAGNOSIS — G4733 Obstructive sleep apnea (adult) (pediatric): Secondary | ICD-10-CM | POA: Diagnosis not present

## 2020-08-18 DIAGNOSIS — K5090 Crohn's disease, unspecified, without complications: Secondary | ICD-10-CM | POA: Diagnosis not present

## 2020-08-18 DIAGNOSIS — D490 Neoplasm of unspecified behavior of digestive system: Secondary | ICD-10-CM

## 2020-08-18 DIAGNOSIS — K56691 Other complete intestinal obstruction: Secondary | ICD-10-CM | POA: Diagnosis not present

## 2020-08-18 DIAGNOSIS — K6389 Other specified diseases of intestine: Secondary | ICD-10-CM

## 2020-08-18 DIAGNOSIS — R933 Abnormal findings on diagnostic imaging of other parts of digestive tract: Secondary | ICD-10-CM | POA: Diagnosis not present

## 2020-08-18 HISTORY — PX: FLEXIBLE SIGMOIDOSCOPY: SHX5431

## 2020-08-18 HISTORY — PX: BIOPSY: SHX5522

## 2020-08-18 LAB — GLUCOSE, CAPILLARY
Glucose-Capillary: 68 mg/dL — ABNORMAL LOW (ref 70–99)
Glucose-Capillary: 93 mg/dL (ref 70–99)
Glucose-Capillary: 66 mg/dL — ABNORMAL LOW (ref 70–99)
Glucose-Capillary: 74 mg/dL (ref 70–99)
Glucose-Capillary: 86 mg/dL (ref 70–99)

## 2020-08-18 SURGERY — SIGMOIDOSCOPY, FLEXIBLE
Anesthesia: General

## 2020-08-18 MED ORDER — SODIUM CHLORIDE 0.9 % IV SOLN: INTRAVENOUS | Status: DC

## 2020-08-18 MED ORDER — PROPOFOL 10 MG/ML IV BOLUS
INTRAVENOUS | Status: AC
  Filled 2020-08-18: qty 140

## 2020-08-18 MED ORDER — LACTATED RINGERS IV SOLN
Freq: Once | INTRAVENOUS | Status: AC
  Administered 2020-08-18: 1000 mL via INTRAVENOUS

## 2020-08-18 MED ORDER — LIDOCAINE 2% (20 MG/ML) 5 ML SYRINGE
INTRAMUSCULAR | Status: AC
  Filled 2020-08-18: qty 5

## 2020-08-18 MED ORDER — DEXTROSE 50 % IV SOLN
25.0000 mL | Freq: Once | INTRAVENOUS | Status: AC
  Administered 2020-08-18: 25 mL via INTRAVENOUS

## 2020-08-18 MED ORDER — STERILE WATER FOR IRRIGATION IR SOLN
Status: DC | PRN
  Administered 2020-08-18: 1.5 mL

## 2020-08-18 MED ORDER — LACTATED RINGERS IV SOLN: INTRAVENOUS | Status: DC | PRN

## 2020-08-18 MED ORDER — GLYCOPYRROLATE PF 0.2 MG/ML IJ SOSY
PREFILLED_SYRINGE | INTRAMUSCULAR | Status: AC
  Filled 2020-08-18: qty 5

## 2020-08-18 MED ORDER — PROPOFOL 500 MG/50ML IV EMUL
INTRAVENOUS | Status: DC | PRN
  Administered 2020-08-18: 150 ug/kg/min via INTRAVENOUS

## 2020-08-18 MED ORDER — PROPOFOL 10 MG/ML IV BOLUS
INTRAVENOUS | Status: DC | PRN
  Administered 2020-08-18: 20 mg via INTRAVENOUS

## 2020-08-18 MED ORDER — DEXTROSE 50 % IV SOLN
INTRAVENOUS | Status: AC
  Filled 2020-08-18: qty 50

## 2020-08-18 NOTE — Progress Notes (Addendum)
Physical Therapy Treatment Patient Details Name: [name] MRN: [id number] DOB: March 19, 1941 Today's Date: 08/18/2020    History of Present Illness [name] is a 79 y.o. 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  Patient was unable to provide history due to being somnolent, though easily aroused, history was obtained from daughter by phone and ED physician and ED chart.  Per report, patient was discharged home from a nursing facility 4 days ago, she lives alone (at baseline, she is able to take care of most of her ADLs and maintain normal conversation and daughter checks on her regularly), but patient has been feeling weak since past 3 days, she has had poor appetite and has been more somnolent and drowsy at home.  Daughter activated EMS and she was sent to the ED for further evaluation and management.  Patient was seen by her PCP on 8/12 during which she complained of ongoing rectal bleeding (patient has a pending GI follow-up).  She was admitted to this facility from 7/4-7/8 due to acute blood loss anemia and recurrent C. difficile colitis.    PT Comments    The patient was able to take part in therapeutic exercises seated in chair today with no increase in LLE pain. The patient demonstrated sit to stand with min assist and ambulated 60 feet w min guard assistance today with no reported abdominal pain. The patient took slow, labored steps with increased reliance on RW, but with no LOB or undue fatigue. Patient tolerated sitting up in chair after therapy. PLAN: The patient will continue to benefit from skilled physical therapy services in hospital at recommended venue below in order to improve balance, gait, and ADL's to promote independence in functional  activities.     Follow Up Recommendations  SNF     Equipment Recommendations  None recommended by PT    Recommendations for Other Services  None recommended by PT.      Precautions / Restrictions Precautions Precautions: Fall Precaution Booklet Issued: No Restrictions Weight Bearing Restrictions: No    Mobility  Bed Mobility                  Transfers Overall transfer level: Needs assistance Equipment used: Rolling walker (2 wheeled) Transfers: Sit to/from Stand Sit to Stand: Min assist         General transfer comment: min assist sit to stand due to BLE weakness  Ambulation/Gait Ambulation/Gait assistance: Min guard Gait Distance (Feet): 60 Feet Assistive device: Rolling walker (2 wheeled) Gait Pattern/deviations: Step-through pattern;Decreased stride length;Wide base of support Gait velocity: decreased   General Gait Details: slow labored cadence with increased caution and awareness while ambulating   Stairs             Wheelchair Mobility    Modified Rankin (Stroke Patients Only)       Balance Overall balance assessment: Needs assistance Sitting-balance support: No upper extremity supported;Feet supported Sitting balance-Leahy Scale: Good Sitting balance - Comments: seated in chair   Standing balance support: Bilateral upper extremity supported;During functional activity Standing balance-Leahy Scale: Good Standing balance comment: used RW, slow cautious turns successfully made w min guard assist                            Cognition Arousal/Alertness:  Awake/alert Behavior During Therapy: WFL for tasks assessed/performed Overall Cognitive Status: Within Functional Limits for tasks assessed                                        Exercises General Exercises - Lower Extremity Long Arc Quad: Strengthening;Seated;Both;15 reps Hip Flexion/Marching: Strengthening;Seated;Both;15 reps Toe Raises:  AROM;Seated;Strengthening;Both;15 reps Heel Raises: AROM;Seated;Strengthening;Both;15 reps    General Comments        Pertinent Vitals/Pain Pain Assessment: 0-10 Pain Score: 6  Faces Pain Scale: Hurts even more Pain Location: L knee Pain Descriptors / Indicators: Discomfort;Sore;Grimacing Pain Intervention(s): Monitored during session    Home Living                      Prior Function            PT Goals (current goals can now be found in the care plan section) Acute Rehab PT Goals Patient Stated Goal: Return home PT Goal Formulation: With patient Time For Goal Achievement: 08/29/20 Potential to Achieve Goals: Fair Progress towards PT goals: Progressing toward goals    Frequency    Min 3X/week      PT Plan Current plan remains appropriate;Other (comment) (pt shows some interest in OPPT for knee arthritis)    Co-evaluation              AM-PAC PT "6 Clicks" Mobility   Outcome Measure  Help needed turning from your back to your side while in a flat bed without using bedrails?: A Little Help needed moving from lying on your back to sitting on the side of a flat bed without using bedrails?: A Little Help needed moving to and from a bed to a chair (including a wheelchair)?: A Little Help needed standing up from a chair using your arms (e.g., wheelchair or bedside chair)?: A Little Help needed to walk in hospital room?: A Little Help needed climbing 3-5 steps with a railing? : A Lot 6 Click Score: 17    End of Session Equipment Utilized During Treatment: Gait belt Activity Tolerance: Patient tolerated treatment well;No increased pain Patient left: with call bell/phone within reach;in chair   PT Visit Diagnosis: Unsteadiness on feet (R26.81);Other abnormalities of gait and mobility (R26.89);Muscle weakness (generalized) (M62.81)     Time: 0175-1025 PT Time Calculation (min) (ACUTE ONLY): 23 min  Charges:  $Therapeutic Exercise: 8-22  mins $Therapeutic Activity: 8-22 mins                     2:45 PM , 08/18/20 Karlyn Agee, SPT Physical Therapy with [facility] [phone] office  This qualified practitioner was present in the room guiding the student in service delivery. Therapy student was participating in the provision of services, and the practitioner was not engaged in treating another patient or doing other tasks at the same time.  Floria Raveling. Hartnett-Rands, MS, PT Per [place] [phone] 08/18/2020

## 2020-08-18 NOTE — Transfer of Care (Signed)
Immediate Anesthesia Transfer of Care Note  Patient: [name]  Procedure(s) Performed: FLEXIBLE SIGMOIDOSCOPY BIOPSY  Patient Location: PACU  Anesthesia Type:General  Level of Consciousness: awake  Airway & Oxygen Therapy: Patient Spontanous Breathing  Post-op Assessment: Report given to RN and Post -op Vital signs reviewed and stable  Post vital signs: Reviewed and stable  Last Vitals:  Vitals Value Taken Time  BP 117/96 08/18/20 0809  Temp    Pulse 82 08/18/20 0811  Resp 32 08/18/20 0811  SpO2 94 % 08/18/20 0811  Vitals shown include unvalidated device data.  Last Pain:  Vitals:   08/18/20 0651  TempSrc: Oral  PainSc: 0-No pain      Patients Stated Pain Goal: 8 (88/91/69 4503)  Complications: No complications documented.

## 2020-08-18 NOTE — Progress Notes (Signed)
Pt's blood sugar 66mg /dl. Pt alert, oriented to person and place. Given orange juice to drink. Pt then assisted up to chair after incontinent of urine in bed. Pt cleaned, ambulated with assistance to chair and now eating lunch tray. Chair alarm on.

## 2020-08-18 NOTE — Discharge Summary (Signed)
Physician Discharge Summary  [name] GLO:[id number] DOB: 1941/08/27 DOA: 08/12/2020  PCP: Fayrene Helper, MD  Admit date: 08/12/2020 Discharge date: 08/18/2020  Admitted From: home Disposition:  home  Recommendations for Outpatient Follow-up:  1. Follow up with PCP in 1-2 weeks 2. Please obtain BMP/CBC in one week 3. Arrangements were made for patient to go to SNF, but she adamantly refused. Despite several conversations by staff and patient's family, she continued to refuse placement and decided to go home with home health 4. Follow up with general surgery next week to discuss resection of colon mass 5. 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 Patient was unable to provide history due to being somnolent, though easily aroused, history was obtained from daughter by phone and ED physician and ED chart. Per report, patient was discharged home from a nursing facility 4 days ago, she lives alone (at baseline, she is able to take care of most of her ADLs and maintain normal conversation and daughter checks on her regularly), but patient has been feeling weak since past 3 days, she has had poor appetite and has been more somnolent and drowsy at home. Daughter activated EMS and she was sent to the ED for further evaluation and management. Patient was seen by her PCP on 8/12 during which she complained of ongoing rectal bleeding (patient has a pending GI follow-up). She was  admitted to this facility from 7/4-7/8 due to acute blood loss anemia and recurrent C. difficile colitis.  -Patient was admitted with acute encephalopathy of unknown origin and thus far, negative work-up. She is also noted to have acute colitis/diverticulitis. Additionally she is noted to have very mild blood loss anemia with positive Hemoccult. GI consulted for evaluationwith further recommendations to follow by a.m. PICC team has placed IV access and patient remains on PPI and Zosyn for now.  Discharge Diagnoses:  Principal Problem:   Altered mental status Active Problems:   Type 2 diabetes mellitus (HCC)   Hyperlipemia   Obesity (BMI 30.0-34.9)   IDA (iron deficiency anemia)   GERD   Acute colitis   Type 2 diabetes mellitus with diabetic neuropathy, unspecified (HCC)   Pelvic mass in female   GI bleed   Hyperglycemia   Generalized weakness   Hypoalbuminemia  Transient acute encephalopathy-resolved -Possibly related to acute colitis -Ammonia level within normal limits -Patient is back to baseline level of mentation  Sigmoid colon mass -seen by GI and underwent colonoscopy -found to have circumferential mass in the sigmoid colon.  -discussed with Dr. Abbey Chatters, and mass did not appear to be completely obstructing -biopsy taken during procedure -per GI, recommendations to continue on full liquid diet and follow up with general surgery next week to discuss resection (appt made) -patient will also follow up with with oncology to discuss further work up including staging  Generalized weakness  -PT/OT evaluation ordered, with recommendations for SNF -Continue IV Protonix, now twice daily   Myasthenia gravis -Resume home Mestinon and prednisone  Anxiety disorder -Resume home medications  GERD -Continue PPI BID  Type 2 diabetes with hyperglycemia-improved -Continue SSI  Hypoalbuminemia possibly  secondary to moderate protein calorie malnutrition -Dietitian  consult for supplementation  Obesity -Lifestyle changes  Discharge Instructions  Discharge Instructions    Diet - low sodium heart healthy   Complete by: As directed    Increase activity slowly   Complete by: As directed      Allergies as of 08/18/2020   No Known Allergies     Medication List    STOP taking these medications   cholestyramine 4 GM/DOSE powder Commonly known as: QUESTRAN   Lantus SoloStar 100 UNIT/ML Solostar Pen Generic drug: insulin glargine   linagliptin 5 MG Tabs tablet Commonly known as: Tradjenta   losartan 25 MG tablet Commonly known as: COZAAR     TAKE these medications   acetaminophen 325 MG tablet Commonly known as: TYLENOL Take 2 tablets (650 mg total) by mouth every 6 (six) hours as needed for mild pain, fever or headache (or Fever >/= 101).   albuterol 108 (90 Base) MCG/ACT inhaler Commonly known as: ProAir HFA Inhale 2 puffs into the lungs every 6 (six) hours as needed.   cetirizine 10 MG tablet Commonly known as: ZYRTEC TAKE 1 TABLET BY MOUTH ONCE A DAY.   citalopram 10 MG tablet Commonly known as: CELEXA Take 1 tablet (10 mg total) by mouth daily.   Combigan 0.2-0.5 % ophthalmic solution Generic drug: brimonidine-timolol Place 1 drop into both eyes every 12 (twelve) hours.   Cranberry 1000 MG Caps Take 1,000 mg by mouth daily.   cycloSPORINE 0.05 % ophthalmic emulsion Commonly known as: RESTASIS Place 1 drop into both eyes 2 (two) times daily.   diltiazem 120 MG 24 hr capsule Commonly known as: Cardizem CD Take 1 capsule (120 mg total) by mouth daily.   ezetimibe 10 MG tablet Commonly known as: ZETIA Take 1 tablet (10 mg total) by mouth daily.   famotidine 40 MG tablet Commonly known as: PEPCID Take 1 tablet (40 mg total) by mouth every evening.   fluticasone 50 MCG/ACT nasal spray Commonly known as: FLONASE Place 2 sprays into both nostrils daily.   gabapentin 300 MG capsule Commonly known as:  NEURONTIN TAKE (1) CAPSULE BY MOUTH TWICE DAILY.   hydrocortisone 2.5 % rectal cream Commonly known as: ANUSOL-HC Place 1 application rectally 2 (two) times daily.   insulin lispro 100 UNIT/ML KwikPen Commonly known as: HumaLOG KwikPen Inject 0.03 mLs (3 Units total) into the skin 3 (three) times daily. insulin pen; 100 unit/mL; amt: Per Sliding Scale; If Blood Sugar is 0 to 149, give 0 Units. If Blood Sugar is greater than 149, give 3 Units. subcutaneous Before Meals   multivitamin with minerals Tabs tablet Take 1 tablet by mouth at bedtime.   nystatin powder Generic drug: nystatin Apply 1 application topically daily as needed. Special Instructions: fungal rash to groin and inguinal folds   ondansetron 4 MG tablet Commonly known as: ZOFRAN Take 1 tablet (4 mg total) by mouth every 8 (eight) hours as needed for nausea or vomiting.   predniSONE 5 MG tablet Commonly known as: DELTASONE Take 1 tablet (5 mg total) by mouth daily.   pyridostigmine 60 MG tablet Commonly known as: MESTINON Take three times daily   RISA-BID PROBIOTIC PO Take 1 tablet by mouth in the morning and at bedtime.   rosuvastatin 40 MG tablet Commonly known as: CRESTOR TAKE (1) TABLET BY MOUTH AT BEDTIME.   solifenacin 10 MG tablet Commonly known as: VESICARE Take 1 tablet (10 mg total) by mouth daily.   venlafaxine XR  150 MG 24 hr capsule Commonly known as: EFFEXOR-XR Take 1 capsule (150 mg total) by mouth daily.   Vitamin D3 50 MCG (2000 UT) Tabs Take 2,000 Units by mouth at bedtime.       Follow-up Information    Aviva Signs, MD On 08/23/2020.   Specialty: General Surgery Why: at 3:30 pm Contact information: [address] [place] [address] [phone]        Derek Jack, MD On 09/01/2020.   Specialty: Hematology Why: at 8:00 am Contact information: [address] [place] [address] [phone]        [facility] Follow up.   Specialty: Home Health  Services Why: PT/OT/SW/RN/HH aide Contact information: [place] [address] [phone]              No Known Allergies  Consultations:  gastroenterology   Procedures/Studies: CT Head Wo Contrast  Result Date: 08/12/2020 CLINICAL DATA:  Mental status change, unknown cause. Additional history provided: Confusion. EXAM: CT HEAD WITHOUT CONTRAST TECHNIQUE: Contiguous axial images were obtained from the base of the skull through the vertex without intravenous contrast. COMPARISON:  Brain MRI 05/18/2013 FINDINGS: Brain: Mild generalized parenchymal atrophy. Mild ill-defined hypoattenuation within the cerebral white matter is nonspecific, but consistent with chronic small vessel ischemic disease. Findings are similar as compared to the MRI of 05/18/2013. There is no acute intracranial hemorrhage. No demarcated cortical infarct is identified. No extra-axial fluid collection. No evidence of intracranial mass. No midline shift. Partially empty sella turcica. Vascular: No hyperdense vessel.  Atherosclerotic calcifications. Skull: Normal. Negative for fracture or focal lesion. Sinuses/Orbits: Visualized orbits show no acute finding. Sphenoid sinus mucosal thickening, moderate on the right, mild on the left). Bilateral mastoid effusions. IMPRESSION: No CT evidence of acute intracranial abnormality. Mild generalized parenchymal atrophy and chronic small vessel ischemic disease, without appreciable change as compared to the MRI of 05/18/2013. Sphenoid sinusitis. Bilateral mastoid effusions. Electronically Signed   By: Kellie Simmering DO   On: 08/12/2020 18:07   CT ABDOMEN PELVIS W CONTRAST  Result Date: 08/12/2020 CLINICAL DATA:  79 year old female with abdominal pain and fever. EXAM: CT ABDOMEN AND PELVIS WITH CONTRAST TECHNIQUE: Multidetector CT imaging of the abdomen and pelvis was performed using the standard protocol following bolus administration of intravenous contrast.  CONTRAST:  117mL OMNIPAQUE IOHEXOL 300 MG/ML  SOLN COMPARISON:  CT abdomen pelvis dated 06/12/2020. FINDINGS: Lower chest: There are bibasilar dependent atelectatic changes. There is mild cardiomegaly. Three vessel coronary vascular calcification. There is no intra-abdominal free air or free fluid. Hepatobiliary: Probable mild fatty liver. No intrahepatic biliary ductal dilatation. Cholecystectomy. No retained calcified stone noted in the central CBD. Pancreas: Unremarkable. No pancreatic ductal dilatation or surrounding inflammatory changes. Spleen: Normal in size without focal abnormality. Adrenals/Urinary Tract: The adrenal glands unremarkable. There is mild bilateral renal parenchyma atrophy and cortical irregularity and scarring. Small bilateral renal cysts and subcentimeter hypodensities which are too small to characterize. There is no hydronephrosis on either side. There is symmetric enhancement and excretion of contrast by both kidneys. The visualized ureters and urinary bladder appear unremarkable. Stomach/Bowel: There is diffuse diverticulosis of the descending and sigmoid colon. There is long segment circumferential thickening of the descending and sigmoid colon with stranding of the adjacent fat which may be inflammatory/infectious in etiology. A colonic mass is not excluded. Overall similar or slight progression of the inflammatory changes compared to the prior CT. Multiple scattered rounded lymph nodes again seen along this  segment of the colon as seen on the prior CT. There is a small hiatal hernia. Moderate stool noted throughout the colon. No bowel obstruction. Appendectomy. Vascular/Lymphatic: Moderate aortoiliac atherosclerotic disease. The IVC is unremarkable. No portal venous gas. There is no adenopathy. Reproductive: Hysterectomy. There is a 2.5 cm right adnexal cyst. Other: None Musculoskeletal: Degenerative changes of the spine. No acute osseous pathology. IMPRESSION: 1. Distal colonic  diverticulosis with long segment circumferential thickening of the descending and sigmoid colon. Overall similar or slightly progression of inflammatory changes compared to the prior CT. Findings may the inflammatory/infectious in etiology. Underlying colonic mass is not excluded. Clinical correlation and further evaluation with colonoscopy on a nonemergent/outpatient basis and following resolution of acute symptoms recommended. 2. A 2.5 cm right adnexal cyst. Further evaluation with pelvic ultrasound on a nonemergent basis recommended. 3. Aortic Atherosclerosis (ICD10-I70.0). Electronically Signed   By: Anner Crete M.D.   On: 08/12/2020 23:33   DG Chest Portable 1 View  Result Date: 08/12/2020 CLINICAL DATA:  Infection workup EXAM: PORTABLE CHEST 1 VIEW COMPARISON:  04/29/2020 FINDINGS: Cardiomegaly. No confluent airspace opacities, effusions or edema. No acute bony abnormality. IMPRESSION: Cardiomegaly.  No active disease. Electronically Signed   By: Rolm Baptise M.D.   On: 08/12/2020 18:28   [place] EKG SITE RITE  Result Date: 08/13/2020 If Site Rite image not attached, placement could not be confirmed due to current cardiac rhythm.      Subjective: No abdominal pain or bloody stools. Tolerating full liquids, wants to go home  Discharge Exam: Vitals:   08/18/20 0830 08/18/20 0845 08/18/20 0930 08/18/20 1335  BP: 115/79 (!) 164/57 (!) 160/53 (!) 144/61  Pulse:   63 89  Resp: 19 19 18 16   Temp:    98.2 F (36.8 C)  TempSrc:    Oral  SpO2: 100% 100% 100% 100%  Weight:      Height:        General: Pt is alert, awake, not in acute distress Cardiovascular: RRR, S1/S2 +, no rubs, no gallops Respiratory: CTA bilaterally, no wheezing, no rhonchi Abdominal: Soft, NT, ND, bowel sounds + Extremities: no edema, no cyanosis    The results of significant diagnostics from this hospitalization (including imaging, microbiology, ancillary and laboratory) are listed below for reference.      Microbiology: Recent Results (from the past 240 hour(s))  Urine culture     Status: None   Collection Time: 08/12/20  5:07 PM   Specimen: Urine, Catheterized  Result Value Ref Range Status   Specimen Description   Final    URINE, CATHETERIZED Performed at [facility], [address], [place], [place] [address]    Special Requests   Final    NONE Performed at [facility], [address] [place], [place] [address]    Culture   Final    NO GROWTH Performed at [facility], [place] [address], [place], [place] [address]    Report Status 08/14/2020 FINAL  Final  Blood culture (routine single)     Status: None (Preliminary result)   Collection Time: 08/12/20  5:33 PM   Specimen: BLOOD RIGHT HAND  Result Value Ref Range Status   Specimen Description   Final    BLOOD RIGHT HAND Performed at [facility], [address] [place], [place] [address]    Special Requests   Final    BOTTLES DRAWN AEROBIC AND ANAEROBIC Blood Culture adequate volume Performed at [facility], [address], [place], [place] [address]  Culture  Setup Time   Final    GRAM POSITIVE RODS ANAEROBIC BOTTLE Gram Stain Report Called to,Read Back By and Verified With: DEAN,T@0935  BY [name], B @ 8.18.2021 [facility] Performed at [facility], [address] [place], [place] [address]    Culture [place]  Final   Report Status PENDING  Incomplete  SARS Coronavirus 2 by RT PCR (hospital order, performed in [place] hospital lab) Nasopharyngeal Nasopharyngeal Swab     Status: None   Collection Time: 08/12/20  6:55 PM   Specimen: Nasopharyngeal Swab  Result Value Ref Range Status   SARS Coronavirus 2 NEGATIVE NEGATIVE Final    Comment: (NOTE) SARS-CoV-2 target nucleic acids are NOT DETECTED.  The SARS-CoV-2 RNA is generally detectable in upper and lower respiratory specimens during the acute phase of infection. The lowest concentration of SARS-CoV-2 viral copies this assay can  detect is 250 copies / mL. A negative result does not preclude SARS-CoV-2 infection and should not be used as the sole basis for treatment or other patient management decisions.  A negative result may occur with improper specimen collection / handling, submission of specimen other than nasopharyngeal swab, presence of viral mutation(s) within the areas targeted by this assay, and inadequate number of viral copies (<250 copies / mL). A negative result must be combined with clinical observations, patient history, and epidemiological information.  Fact Sheet for Patients:   StrictlyIdeas.no  Fact Sheet for Healthcare Providers: BankingDealers.co.za  This test is not yet approved or  cleared by the [place] FDA and has been authorized for detection and/or diagnosis of SARS-CoV-2 by FDA under an Emergency Use Authorization (EUA).  This EUA will remain in effect (meaning this test can be used) for the duration of the COVID-19 declaration under Section 564(b)(1) of the Act, 21 U.S.C. section 360bbb-3(b)(1), unless the authorization is terminated or revoked sooner.  Performed at [facility], [address], [place], [place] [address]   SARS CORONAVIRUS 2 (TAT 6-24 HRS) Nasopharyngeal Nasopharyngeal Swab     Status: None   Collection Time: 08/14/20  9:46 PM   Specimen: Nasopharyngeal Swab  Result Value Ref Range Status   SARS Coronavirus 2 NEGATIVE NEGATIVE Final    Comment: (NOTE) SARS-CoV-2 target nucleic acids are NOT DETECTED.  The SARS-CoV-2 RNA is generally detectable in upper and lower respiratory specimens during the acute phase of infection. Negative results do not preclude SARS-CoV-2 infection, do not rule out co-infections with other pathogens, and should not be used as the sole basis for treatment or other patient management decisions. Negative results must be combined with clinical observations, patient history, and  epidemiological information. The expected result is Negative.  Fact Sheet for Patients: SugarRoll.be  Fact Sheet for Healthcare Providers: https://www.woods-mathews.com/  This test is not yet approved or cleared by the [place] FDA and  has been authorized for detection and/or diagnosis of SARS-CoV-2 by FDA under an Emergency Use Authorization (EUA). This EUA will remain  in effect (meaning this test can be used) for the duration of the COVID-19 declaration under Se ction 564(b)(1) of the Act, 21 U.S.C. section 360bbb-3(b)(1), unless the authorization is terminated or revoked sooner.  Performed at [facility], [place] [address] [place], [place] [address]      Labs: BNP (last 3 results) No results for input(s): BNP in the last 8760 hours. Basic Metabolic Panel: Recent Labs  Lab 08/13/20 0259 08/14/20 8270 08/15/20 0550 08/16/20 0611 08/17/20 0508  NA 139  135 136 137 134*  K 3.5 2.9* 4.1 3.2* 3.1*  CL 106 100 103 104 103  CO2 24 25 24 24 23   GLUCOSE 99 71 69* 67* 74  BUN 11 8 9 12 13   CREATININE 0.75 0.71 0.89 1.00 1.21*  CALCIUM 8.4* 7.7* 7.6* 7.3* 7.4*  MG 1.7 1.5* 2.0 1.9 2.0  PHOS 3.2  --   --   --   --    Liver Function Tests: Recent Labs  Lab 08/12/20 1732 08/13/20 0259 08/14/20 0812  AST 19 16 16   ALT 19 21 16   ALKPHOS 94 104 91  BILITOT 0.4 0.3 0.5  PROT 6.4* 7.1 5.6*  ALBUMIN 2.2* 2.4* 1.9*   No results for input(s): LIPASE, AMYLASE in the last 168 hours. Recent Labs  Lab 08/12/20 1732  AMMONIA 28   CBC: Recent Labs  Lab 08/12/20 1732 08/12/20 1732 08/13/20 0259 08/14/20 0812 08/15/20 0550 08/16/20 0611 08/17/20 0508  WBC 16.2*   < > 9.8 9.4 11.0* 9.1 6.3  NEUTROABS 13.9*  --   --   --   --   --   --   HGB 8.4*   < > 9.1* 8.5* 8.9* 8.3* 8.3*  HCT 27.8*   < > 30.8* 27.5* 29.2* 27.8* 27.6*  MCV 92.4   < > 94.2 89.3 91.0 92.4 91.7  PLT 369   < > 368 339 362 306 330   < > = values in  this interval not displayed.   Cardiac Enzymes: No results for input(s): CKTOTAL, CKMB, CKMBINDEX, TROPONINI in the last 168 hours. BNP: Invalid input(s): POCBNP CBG: Recent Labs  Lab 08/18/20 0700 08/18/20 0814 08/18/20 1157 08/18/20 1239 08/18/20 1733  GLUCAP 68* 93 66* 74 86   D-Dimer No results for input(s): DDIMER in the last 72 hours. Hgb A1c No results for input(s): HGBA1C in the last 72 hours. Lipid Profile No results for input(s): CHOL, HDL, LDLCALC, TRIG, CHOLHDL, LDLDIRECT in the last 72 hours. Thyroid function studies No results for input(s): TSH, T4TOTAL, T3FREE, THYROIDAB in the last 72 hours.  Invalid input(s): FREET3 Anemia work up No results for input(s): VITAMINB12, FOLATE, FERRITIN, TIBC, IRON, RETICCTPCT in the last 72 hours. Urinalysis    Component Value Date/Time   COLORURINE YELLOW 08/12/2020 1707   APPEARANCEUR CLEAR 08/12/2020 1707   LABSPEC 1.008 08/12/2020 1707   PHURINE 5.0 08/12/2020 1707   GLUCOSEU NEGATIVE 08/12/2020 1707   HGBUR NEGATIVE 08/12/2020 1707   HGBUR negative 06/09/2009 1306   BILIRUBINUR NEGATIVE 08/12/2020 1707   BILIRUBINUR small (A) 09/23/2019 1613   BILIRUBINUR small 11/30/2016 0821   KETONESUR NEGATIVE 08/12/2020 1707   PROTEINUR NEGATIVE 08/12/2020 1707   UROBILINOGEN 1.0 09/23/2019 1613   UROBILINOGEN 0.2 02/25/2011 2020   NITRITE NEGATIVE 08/12/2020 1707   LEUKOCYTESUR NEGATIVE 08/12/2020 1707   Sepsis Labs Invalid input(s): PROCALCITONIN,  WBC,  LACTICIDVEN Microbiology Recent Results (from the past 240 hour(s))  Urine culture     Status: None   Collection Time: 08/12/20  5:07 PM   Specimen: Urine, Catheterized  Result Value Ref Range Status   Specimen Description   Final    URINE, CATHETERIZED Performed at [facility], [address], [place], [place] [address]    Special Requests   Final    NONE Performed at [facility], [address], [place], [place] [address]    Culture   Final    NO  GROWTH Performed at [facility], [place],  [place] [address]    Report Status 08/14/2020 FINAL  Final  Blood culture (routine single)     Status: None (Preliminary result)   Collection Time: 08/12/20  5:33 PM   Specimen: BLOOD RIGHT HAND  Result Value Ref Range Status   Specimen Description   Final    BLOOD RIGHT HAND Performed at [facility], [address], [place], [place] [address]    Special Requests   Final    BOTTLES DRAWN AEROBIC AND ANAEROBIC Blood Culture adequate volume Performed at [facility], [address] [place], [place] [address]    Culture  Setup Time   Final    GRAM POSITIVE RODS ANAEROBIC BOTTLE Gram Stain Report Called to,Read Back By and Verified With: DEAN,T@0935  BY [name], B @ 8.18.2021 [facility] Performed at [facility], [address] [place], [place] [address]    Culture [place]  Final   Report Status PENDING  Incomplete  SARS Coronavirus 2 by RT PCR (hospital order, performed in [place] hospital lab) Nasopharyngeal Nasopharyngeal Swab     Status: None   Collection Time: 08/12/20  6:55 PM   Specimen: Nasopharyngeal Swab  Result Value Ref Range Status   SARS Coronavirus 2 NEGATIVE NEGATIVE Final    Comment: (NOTE) SARS-CoV-2 target nucleic acids are NOT DETECTED.  The SARS-CoV-2 RNA is generally detectable in upper and lower respiratory specimens during the acute phase of infection. The lowest concentration of SARS-CoV-2 viral copies this assay can detect is 250 copies / mL. A negative result does not preclude SARS-CoV-2 infection and should not be used as the sole basis for treatment or other patient management decisions.  A negative result may occur with improper specimen collection / handling, submission of specimen other than nasopharyngeal swab, presence of viral mutation(s) within the areas targeted by this assay, and inadequate number of viral copies (<250 copies / mL). A negative result must  be combined with clinical observations, patient history, and epidemiological information.  Fact Sheet for Patients:   StrictlyIdeas.no  Fact Sheet for Healthcare Providers: BankingDealers.co.za  This test is not yet approved or  cleared by the [place] FDA and has been authorized for detection and/or diagnosis of SARS-CoV-2 by FDA under an Emergency Use Authorization (EUA).  This EUA will remain in effect (meaning this test can be used) for the duration of the COVID-19 declaration under Section 564(b)(1) of the Act, 21 U.S.C. section 360bbb-3(b)(1), unless the authorization is terminated or revoked sooner.  Performed at [facility], [address] [place], [place] [address]   SARS CORONAVIRUS 2 (TAT 6-24 HRS) Nasopharyngeal Nasopharyngeal Swab     Status: None   Collection Time: 08/14/20  9:46 PM   Specimen: Nasopharyngeal Swab  Result Value Ref Range Status   SARS Coronavirus 2 NEGATIVE NEGATIVE Final    Comment: (NOTE) SARS-CoV-2 target nucleic acids are NOT DETECTED.  The SARS-CoV-2 RNA is generally detectable in upper and lower respiratory specimens during the acute phase of infection. Negative results do not preclude SARS-CoV-2 infection, do not rule out co-infections with other pathogens, and should not be used as the sole basis for treatment or other patient management decisions. Negative results must be combined with clinical observations, patient history, and epidemiological information. The expected result is Negative.  Fact Sheet for Patients: SugarRoll.be  Fact Sheet for Healthcare Providers: https://www.woods-mathews.com/  This test is not yet approved or cleared by the [place] FDA and  has been authorized for detection and/or diagnosis of SARS-CoV-2  by FDA under an Emergency Use Authorization (EUA). This EUA will remain  in effect (meaning this test can be used)  for the duration of the COVID-19 declaration under Se ction 564(b)(1) of the Act, 21 U.S.C. section 360bbb-3(b)(1), unless the authorization is terminated or revoked sooner.  Performed at [facility], [place] [address] [place], [place] [address]      Time coordinating discharge: 10mins  SIGNED:   Kathie Dike, MD  [facility] 08/18/2020, 10:47 PM   If 7PM-7AM, please contact night-coverage [email or web address]

## 2020-08-18 NOTE — Progress Notes (Signed)
Pt back to room from endo. Tele on, IV site WNL with no s/s infiltration. VSS. Pt denies c/o.

## 2020-08-18 NOTE — H&P (Signed)
H&P Update  I have reviewed the history and physicals located in the electronic medical record, that have been performed within the 30 days prior to surgery today, and I concur with the assessment therein. I have seen and examined the patient today and I find no substantive changes that would alter the course of therapy/surgery today.  

## 2020-08-18 NOTE — Anesthesia Preprocedure Evaluation (Signed)
Anesthesia Evaluation  Patient identified by MRN, date of birth, ID band Patient awake    Reviewed: Allergy & Precautions, NPO status , Patient's Chart, lab work & pertinent test results  History of Anesthesia Complications (+) history of anesthetic complications (combative)  Airway Mallampati: II  TM Distance: >3 FB Neck ROM: Full    Dental  (+) Edentulous Upper, Missing, Dental Advisory Given   Pulmonary shortness of breath and with exertion, sleep apnea and Continuous Positive Airway Pressure Ventilation , pneumonia, resolved, COPD, former smoker,    Pulmonary exam normal breath sounds clear to auscultation       Cardiovascular Exercise Tolerance: Good hypertension, Pt. on medications Normal cardiovascular exam+ Valvular Problems/Murmurs MR  Rhythm:Regular Rate:Normal  1. Left ventricular ejection fraction, by estimation, is 70 to 75%. The  left ventricle has hyperdynamic function. The left ventricle has no  regional wall motion abnormalities. There is moderate left ventricular  hypertrophy. Left ventricular diastolic  parameters are consistent with Grade I diastolic dysfunction (impaired  relaxation). Elevated left atrial pressure.  2. Right ventricular systolic function is normal. The right ventricular  size is normal.  3. Left atrial size was moderately dilated.  4. The mitral valve is normal in structure. Trivial mitral valve  regurgitation. No evidence of mitral stenosis.  5. The aortic valve is tricuspid. Aortic valve regurgitation is not  visualized. No aortic stenosis is present.  6. The inferior vena cava is normal in size with greater than 50%  respiratory variability, suggesting right atrial pressure of 3 mmHg   Neuro/Psych  Headaches, PSYCHIATRIC DISORDERS Anxiety Depression  Neuromuscular disease    GI/Hepatic PUD, GERD  Medicated,  Endo/Other  diabetes, Well Controlled, Type 2Hypothyroidism    Renal/GU Renal InsufficiencyRenal disease     Musculoskeletal  (+) Arthritis , Myasthenia gravis in remission   Abdominal   Peds  Hematology  (+) anemia ,   Anesthesia Other Findings   Reproductive/Obstetrics                            Anesthesia Physical Anesthesia Plan  ASA: III  Anesthesia Plan: General   Post-op Pain Management:    Induction: Intravenous  PONV Risk Score and Plan: TIVA  Airway Management Planned: Nasal Cannula, Natural Airway and Simple Face Mask  Additional Equipment:   Intra-op Plan:   Post-operative Plan:   Informed Consent: I have reviewed the patients History and Physical, chart, labs and discussed the procedure including the risks, benefits and alternatives for the proposed anesthesia with the patient or authorized representative who has indicated his/her understanding and acceptance.     Dental advisory given  Plan Discussed with: CRNA and Surgeon  Anesthesia Plan Comments:        Anesthesia Quick Evaluation

## 2020-08-18 NOTE — Progress Notes (Signed)
Pt discharged via [facility] to home. AVS reviewed with patient. PICC Line removed, line intact, pressure held at site for 37min posts removal. Site covered with vaseline guaze and 4x4 and secured with tape.

## 2020-08-18 NOTE — Anesthesia Postprocedure Evaluation (Signed)
Anesthesia Post Note  Patient: TANAYSHA ALKINS  Procedure(s) Performed: FLEXIBLE SIGMOIDOSCOPY BIOPSY  Patient location during evaluation: PACU Anesthesia Type: General Level of consciousness: awake and awake and alert Pain management: pain level controlled Vital Signs Assessment: post-procedure vital signs reviewed and stable Respiratory status: spontaneous breathing Cardiovascular status: stable Postop Assessment: no apparent nausea or vomiting Anesthetic complications: no   No complications documented.   Last Vitals:  Vitals:   08/18/20 0830 08/18/20 0845  BP: 115/79 (!) 164/57  Pulse:    Resp: 19 19  Temp:    SpO2: 100% 100%    Last Pain:  Vitals:   08/18/20 0845  TempSrc:   PainSc: 0-No pain                 Bashar Milam Hristova

## 2020-08-18 NOTE — TOC Transition Note (Addendum)
Transition of Care [facility]) - CM/SW Discharge Note   Patient Details  Name: [name] MRN: [id number] Date of Birth: [birth year]  [facility] CM/SW Contact:  Boneta Lucks, RN Phone Number: 08/18/2020, 4:29 PM   Clinical Narrative:   Patient is refusing to go to [place] updated. [name] - Daughter and POA at the bedside, offering to take patient home with her. Patient also refusing. She states she has colon cancer and wants to go home. Daughter and son in law states she will not let them help her without be verbal abusive. Patient states she does not need their help. When asking her how she was going to live alone and care for herself, she states she will crawl on the floor. She is alert and oriented. Patient walked 4 feet with PT. TOC called APS to review the case. They also can not make her go to SNF against her will. They did say once she is home and if patient will not allow family to stay and care for her, they can call APS. Number provide to [facility].  TOC is working on home health for RN/PT/OT/Aide/SW.  [name] updated with discharge plan, TOC printed Medical necessity. RN will call EMS when patient is ready.     Final next level of care: [place] Barriers to Discharge: Barriers Resolved   Patient Goals and CMS Choice Patient states their goals for this hospitalization and ongoing recovery are:: patient refusing SNF, wants to go home. CMS Medicare.gov Compare Post Acute Care list provided to:: Patient Choice offered to / list presented to : Patient  Discharge Placement         Discharge Plan and Services    HH Arranged: RN, PT, OT, Nurse's Aide, Social Work      Readmission Risk Interventions Readmission Risk Prevention Plan 07/07/2020  Transportation Screening Complete  Medication Review Press photographer) Complete  PCP or Specialist appointment within 3-5 days of discharge Complete  HRI or [place] Not Complete   SW Recovery Care/Counseling Consult Complete  [place] Complete  Some recent data might be hidden

## 2020-08-18 NOTE — Op Note (Signed)
[facility] Patient Name: [name] Procedure Date: 08/18/2020 7:10 AM MRN: [id number] Date of Birth: 1941-02-05 Attending MD: Elon Alas. Abbey Chatters DO CSN: [id number] Age: 79 Admit Type: Outpatient Procedure:                Colonoscopy Indications:              Iron deficiency anemia, Abnormal CT of the GI tract Providers:                Elon Alas. Abbey Chatters, DO, Jessica Boudreaux, Janeece Riggers, RN Referring MD:              Medicines:                See the Anesthesia note for documentation of the                            administered medications Complications:            No immediate complications. Estimated Blood Loss:     Estimated blood loss was minimal. Procedure:                Pre-Anesthesia Assessment:                           - The anesthesia plan was to use monitored                            anesthesia care (MAC).                           After obtaining informed consent, the colonoscope                            was passed under direct vision. Throughout the                            procedure, the patient's blood pressure, pulse, and                            oxygen saturations were monitored continuously. The                            [id number]-[id number] ([id number]) scope was introduced through                            the anus with the intention of advancing to the                            cecum. The scope was advanced to the sigmoid colon                            before the procedure was aborted. Medications were  given. The colonoscopy was performed without                            difficulty. The patient tolerated the procedure                            well. The quality of the bowel preparation was                            evaluated using the BBPS [facility] Bowel Preparation                            Scale) with scores of: Left Colon = 2 (minor amount                            of residual staining,  small fragments of stool                            and/or opaque liquid, but mucosa seen well). The                            total BBPS score equals 2. The quality of the bowel                            preparation was good. Scope In: 7:47:27 AM Scope Out: 7:59:24 AM Total Procedure Duration: 0 hours 11 minutes 57 seconds  Findings:      Hemorrhoids were found on perianal exam.      A polypoid and ulcerated completely obstructing large mass was found in       the sigmoid colon at approximately 35 cm. The mass was circumferential.       Oozing was present. I was unable to pass pediatric colonoscope past the       obstruction. Mucosa was biopsied with a cold forceps for histology.       Procedure was then aborted. Impression:               - Hemorrhoids found on perianal exam.                           - Rule out malignancy, completely obstructing tumor                            in the sigmoid colon. Biopsied. Moderate Sedation:      Per Anesthesia Care Recommendation:           - Return patient to hospital ward for ongoing care.                           - Advance diet as tolerated.                           - Await pathology results.                           - Will need surgical consultation in the outpatient  setting. Procedure Code(s):        --- Professional ---                           [id number], 78, Colonoscopy, flexible; diagnostic,                            including collection of specimen(s) by brushing or                            washing, when performed (separate procedure) Diagnosis Code(s):        --- Professional ---                           D49.0, Neoplasm of unspecified behavior of                            digestive system                           K56.691, Other complete intestinal obstruction                           K64.9, Unspecified hemorrhoids                           D50.9, Iron deficiency anemia, unspecified                            R93.3, Abnormal findings on diagnostic imaging of                            other parts of digestive tract CPT copyright 2019 American Medical Association. All rights reserved. The codes documented in this report are preliminary and upon coder review may  be revised to meet current compliance requirements. Elon Alas. Abbey Chatters, [place] Abbey Chatters, DO 08/18/2020 8:08:03 AM This report has been signed electronically. Number of Addenda: 0

## 2020-08-18 NOTE — Clinical Social Work Note (Signed)
[facility] accepted [facility] referral. HH added to AVS. TOC signing off.  Tobi Bastos, LCSW Transitions of Care Clinical Social Worker [facility] Ph: [phone]

## 2020-08-19 ENCOUNTER — Encounter (HOSPITAL_COMMUNITY): Payer: Self-pay | Admitting: Emergency Medicine

## 2020-08-19 ENCOUNTER — Other Ambulatory Visit: Payer: Self-pay

## 2020-08-19 ENCOUNTER — Emergency Department (HOSPITAL_COMMUNITY): Payer: Medicare Other

## 2020-08-19 ENCOUNTER — Emergency Department (HOSPITAL_COMMUNITY)
Admission: EM | Admit: 2020-08-19 | Discharge: 2020-08-22 | Disposition: A | Discharge status: Skilled Nursing Facility | Payer: Medicare Other | Attending: Emergency Medicine | Admitting: Emergency Medicine

## 2020-08-19 ENCOUNTER — Telehealth: Payer: Self-pay

## 2020-08-19 DIAGNOSIS — W07XXXA Fall from chair, initial encounter: Secondary | ICD-10-CM | POA: Diagnosis not present

## 2020-08-19 DIAGNOSIS — H748X2 Other specified disorders of left middle ear and mastoid: Secondary | ICD-10-CM | POA: Diagnosis not present

## 2020-08-19 DIAGNOSIS — I129 Hypertensive chronic kidney disease with stage 1 through stage 4 chronic kidney disease, or unspecified chronic kidney disease: Secondary | ICD-10-CM | POA: Insufficient documentation

## 2020-08-19 DIAGNOSIS — Z96651 Presence of right artificial knee joint: Secondary | ICD-10-CM | POA: Insufficient documentation

## 2020-08-19 DIAGNOSIS — W19XXXA Unspecified fall, initial encounter: Secondary | ICD-10-CM

## 2020-08-19 DIAGNOSIS — I6522 Occlusion and stenosis of left carotid artery: Secondary | ICD-10-CM | POA: Diagnosis not present

## 2020-08-19 DIAGNOSIS — Z20822 Contact with and (suspected) exposure to covid-19: Secondary | ICD-10-CM | POA: Diagnosis not present

## 2020-08-19 DIAGNOSIS — E1122 Type 2 diabetes mellitus with diabetic chronic kidney disease: Secondary | ICD-10-CM | POA: Insufficient documentation

## 2020-08-19 DIAGNOSIS — Z794 Long term (current) use of insulin: Secondary | ICD-10-CM | POA: Diagnosis not present

## 2020-08-19 DIAGNOSIS — Z87891 Personal history of nicotine dependence: Secondary | ICD-10-CM | POA: Diagnosis not present

## 2020-08-19 DIAGNOSIS — G8929 Other chronic pain: Secondary | ICD-10-CM | POA: Diagnosis not present

## 2020-08-19 DIAGNOSIS — N289 Disorder of kidney and ureter, unspecified: Secondary | ICD-10-CM | POA: Diagnosis not present

## 2020-08-19 DIAGNOSIS — R55 Syncope and collapse: Secondary | ICD-10-CM | POA: Diagnosis not present

## 2020-08-19 DIAGNOSIS — M545 Low back pain: Secondary | ICD-10-CM | POA: Insufficient documentation

## 2020-08-19 DIAGNOSIS — S3992XA Unspecified injury of lower back, initial encounter: Secondary | ICD-10-CM | POA: Diagnosis not present

## 2020-08-19 DIAGNOSIS — I739 Peripheral vascular disease, unspecified: Secondary | ICD-10-CM | POA: Diagnosis not present

## 2020-08-19 DIAGNOSIS — Z79899 Other long term (current) drug therapy: Secondary | ICD-10-CM | POA: Diagnosis not present

## 2020-08-19 DIAGNOSIS — N183 Chronic kidney disease, stage 3 unspecified: Secondary | ICD-10-CM | POA: Diagnosis not present

## 2020-08-19 DIAGNOSIS — Z043 Encounter for examination and observation following other accident: Secondary | ICD-10-CM | POA: Diagnosis not present

## 2020-08-19 DIAGNOSIS — I10 Essential (primary) hypertension: Secondary | ICD-10-CM | POA: Diagnosis not present

## 2020-08-19 LAB — CBC WITH DIFFERENTIAL/PLATELET
Abs Immature Granulocytes: 0.18 10*3/uL — ABNORMAL HIGH (ref 0.00–0.07)
Basophils Absolute: 0 10*3/uL (ref 0.0–0.1)
Basophils Relative: 0 %
Eosinophils Absolute: 0 10*3/uL (ref 0.0–0.5)
Eosinophils Relative: 0 %
HCT: 32.8 % — ABNORMAL LOW (ref 36.0–46.0)
Hemoglobin: 9.6 g/dL — ABNORMAL LOW (ref 12.0–15.0)
Immature Granulocytes: 1 %
Lymphocytes Relative: 10 %
Lymphs Abs: 1.4 10*3/uL (ref 0.7–4.0)
MCH: 27.4 pg (ref 26.0–34.0)
MCHC: 29.3 g/dL — ABNORMAL LOW (ref 30.0–36.0)
MCV: 93.4 fL (ref 80.0–100.0)
Monocytes Absolute: 0.8 10*3/uL (ref 0.1–1.0)
Monocytes Relative: 6 %
Neutro Abs: 11.7 10*3/uL — ABNORMAL HIGH (ref 1.7–7.7)
Neutrophils Relative %: 83 %
Platelets: 407 10*3/uL — ABNORMAL HIGH (ref 150–400)
RBC: 3.51 MIL/uL — ABNORMAL LOW (ref 3.87–5.11)
RDW: 18.2 % — ABNORMAL HIGH (ref 11.5–15.5)
WBC: 14.2 10*3/uL — ABNORMAL HIGH (ref 4.0–10.5)
nRBC: 0 % (ref 0.0–0.2)

## 2020-08-19 LAB — CK: Total CK: 62 U/L (ref 38–234)

## 2020-08-19 LAB — COMPREHENSIVE METABOLIC PANEL
ALT: 21 U/L (ref 0–44)
AST: 18 U/L (ref 15–41)
Albumin: 2.2 g/dL — ABNORMAL LOW (ref 3.5–5.0)
Alkaline Phosphatase: 120 U/L (ref 38–126)
Anion gap: 8 (ref 5–15)
BUN: 16 mg/dL (ref 8–23)
CO2: 19 mmol/L — ABNORMAL LOW (ref 22–32)
Calcium: 8.1 mg/dL — ABNORMAL LOW (ref 8.9–10.3)
Chloride: 109 mmol/L (ref 98–111)
Creatinine, Ser: 1.46 mg/dL — ABNORMAL HIGH (ref 0.44–1.00)
GFR calc Af Amer: 40 mL/min — ABNORMAL LOW (ref >60)
GFR calc non Af Amer: 34 mL/min — ABNORMAL LOW (ref >60)
Glucose, Bld: 162 mg/dL — ABNORMAL HIGH (ref 70–99)
Potassium: 4.9 mmol/L (ref 3.5–5.1)
Sodium: 136 mmol/L (ref 135–145)
Total Bilirubin: 0.6 mg/dL (ref 0.3–1.2)
Total Protein: 6.5 g/dL (ref 6.5–8.1)

## 2020-08-19 LAB — SURGICAL PATHOLOGY

## 2020-08-19 MED ORDER — FENTANYL CITRATE (PF) 100 MCG/2ML IJ SOLN
25.0000 ug | Freq: Once | INTRAMUSCULAR | Status: DC
  Filled 2020-08-19: qty 2

## 2020-08-19 MED ORDER — MORPHINE SULFATE (PF) 2 MG/ML IV SOLN: 1.0000 mg | Freq: Once | INTRAVENOUS | Status: DC

## 2020-08-19 NOTE — Discharge Instructions (Addendum)
Please have your family doctor recheck your blood work within the week to make sure that your blood work, specifically your kidney function and electrolytes are normal and improving.  You will need to have plenty of fluids  Return to the emergency department for fevers or worsening symptoms

## 2020-08-19 NOTE — Telephone Encounter (Signed)
FYI- I called to give pt MRI appt and daughter answered and said that she had just came in and found pt in the floor and was trying to get her up. She she was just d/c from hosp yesterday and she had refused to go to SNF. Daughter was upset and trying to get in touch with someone to see what to do or how to get her in one. Was desperate for me to send you a message. I told her I would but you would probably not see it until Monday  but I did tell her that she needed to call EMS to take her back to the hospital and hopefully social worker can help with placement from there.

## 2020-08-19 NOTE — ED Provider Notes (Addendum)
[facility] Provider Note   CSN: [id number] Arrival date & time: 08/19/20  1610     History Chief Complaint  Patient presents with  . Fall    [name] is a 79 y.o. female.  HPI 79 year old female with history of anxiety, arthritis, carpal tunnel, COPD, CKD stage III, esophagitis, GERD presents to the ER after a fall.  Patient was just discharged from the hospital yesterday for confusion.  Patient had refused SNF placement, and sent home.  Patient's daughter found the patient on the ground today, was down for an unknown amount of time.  Patient is alert and oriented here in the ED, states that she had fallen while trying to get up out of a chair.  Admits that she will need SNF placement cannot live on her own.  Complains of low back pain however states that this is chronic.  She says she thinks she might of hit her head, but no LOC.  Patient is a poor historian.  Denies any numbness or tingling, no abdominal pain, chest pain or shortness of breath.  No fevers or chills.  No headaches or dizziness.    Past Medical History:  Diagnosis Date  . Anemia in chronic renal disease 12/30/2015  . Anxiety   . Arthritis    "all over" (01/19/2014)  . Arthritis, lumbar spine   . Carpal tunnel syndrome   . Chronic bronchitis ([place])    "got it q yr for awhile; hasn't had it in awhile" (01/19/2014)  . Chronic kidney disease (CKD) stage G3b/A1, moderately decreased glomerular filtration rate (GFR) between 30-44 mL/min/1.73 square meter and albuminuria creatinine ratio less than 30 mg/g 09/14/2009   Qualifier: Diagnosis of  By: Moshe Cipro MD, Joycelyn Schmid    . Complication of anesthesia    combative  . COPD (chronic obstructive pulmonary disease) ([place])   . Depression   . Diverticulosis 07/2004   Colonscopy Dr Gala Romney  . Esophagitis, erosive 2009  . Exertional shortness of breath   . Gastroesophageal reflux   . KAJGOTLX(726.2)    "usually a couple times/wk" (01/19/2014)  . Heart  murmur    saw cardiology In [place], he told her she did not need to come back.  . Hyperlipidemia   . Hypertension   . Impingement syndrome, shoulder   . Iron deficiency anemia   . Mitral regurgitation   . Myasthenia gravis   . Myasthenia gravis in remission ([place]) 11/24/2014  . Obesity   . OSA on CPAP    Negative on last sleep study  . Pneumonia 01/2012  . Pulmonary HTN ([place])   . Schatzki's ring    Last EGD w/ dilation 02/08/11, 2009 & 2007  . Seasonal allergies   . Shoulder pain   . Thyroid disease    "used to take RX; they took me off it" (01/19/2014)  . Tobacco abuse   . Type II diabetes mellitus [facility])     Patient Active Problem List   Diagnosis Date Noted  . Altered mental status 08/13/2020  . Hyperglycemia 08/13/2020  . Generalized weakness 08/13/2020  . Hypoalbuminemia 08/13/2020  . Hospital discharge follow-up 08/12/2020  . Diarrhea 07/15/2020  . GI bleed 07/03/2020  . Abnormal CT scan, colon 07/02/2020  . Normochromic normocytic anemia 06/22/2020  . Hypertension associated with stage 3 chronic kidney disease due to type 2 diabetes mellitus ([place]) 06/21/2020  . Chronic non-seasonal allergic rhinitis 06/21/2020  . Type 2 diabetes mellitus with diabetic neuropathy, unspecified ([place]) 06/21/2020  . Hyperlipidemia  associated with type 2 diabetes mellitus ([place]) 06/21/2020  . Diabetic peripheral neuropathy associated with type 2 diabetes mellitus ([place]) 06/21/2020  . Urinary urgency 06/21/2020  . Pelvic mass in female 06/21/2020  . Acute blood loss anemia 06/21/2020  . COPD (chronic obstructive pulmonary disease) ([place]) 06/21/2020  . Increased intraocular pressure, bilateral 06/21/2020  . Enteritis due to Clostridium difficile 06/18/2020  . Acute colitis 06/12/2020  . Hypomagnesemia 06/12/2020  . Colitis 06/12/2020  . Osteoarthritis of both knees 05/24/2020  . Elevated alkaline phosphatase measurement 10/21/2019  . Back pain 07/05/2019  . Rectal bleeding 01/08/2019  .  Chronic constipation 01/08/2019  . Nausea without vomiting 11/03/2018  . Trigger finger, right index finger   . Monoclonal gammopathy of unknown significance (MGUS) 11/26/2017  . Memory loss of unknown cause 11/10/2017  . Chronic left shoulder pain 09/14/2017  . Anemia in chronic renal disease 12/30/2015  . Morbid obesity ([place]) 04/18/2015  . Myasthenia gravis in remission ([place]) 11/24/2014  . Myasthenia gravis ([place]) 11/16/2014  . Vitamin D deficiency 05/24/2014  . Osteopenia 04/26/2014  . Macular hole of left eye 01/19/2014  . Generalized osteoarthritis 05/25/2013  . Esophageal dysphagia 04/03/2012  . Abnormal chest CT 03/02/2012  . Insomnia 01/02/2012  . Malignant hypertension 05/30/2011  . Schatzki's ring 05/07/2011  . INGROWN TOENAIL 01/10/2011  . Unspecified glaucoma 08/08/2010  . DYSCHROMIA, UNSPECIFIED 08/08/2010  . CKD stage 3 due to type 2 diabetes mellitus ([place]) 09/14/2009  . HIP PAIN, LEFT 06/09/2009  . Urinary incontinence 06/09/2009  . Obesity (BMI 30.0-34.9) 03/06/2009  . IDA (iron deficiency anemia) 10/02/2008  . Hypothyroidism 09/27/2008  . Hyperlipemia 06/30/2008  . Depression with anxiety 06/30/2008  . GERD 06/30/2008  . Obstructive sleep apnea 06/30/2008  . CARPAL TUNNEL SYNDROME 05/19/2008  . SHOULDER PAIN 02/02/2008  . IMPINGEMENT SYNDROME 02/02/2008  . DEGENERATIVE JOINT DISEASE, KNEE 11/24/2007  . Type 2 diabetes mellitus ([place]) 09/09/2007    Past Surgical History:  Procedure Laterality Date  . [place] VITRECTOMY WITH 20 GAUGE MVR PORT Left 01/19/2014   Procedure: 25 GAUGE PARS PLANA VITRECTOMY WITH 20 GAUGE MVR PORT; MEMBRAME PEEL; SERUM PATCH; LASER TREATMENT; C3F8;  Surgeon: Hayden Pedro, MD;  Location: [facility];  Service: Ophthalmology;  Laterality: Left;  . ABDOMINAL HYSTERECTOMY    . AGILE CAPSULE N/A 08/05/2019   Procedure: AGILE CAPSULE;  Surgeon: Daneil Dolin, MD;  [facility];  Service: Endoscopy;  Laterality: N/A;   7:30am  . APPENDECTOMY    . CARPAL TUNNEL RELEASE Bilateral   . CATARACT EXTRACTION W/PHACO Left 10/21/2013   Procedure: LEFT CATARACT EXTRACTION PHACO AND INTRAOCULAR LENS PLACEMENT (IOC);  Surgeon: Marylynn Pearson, MD;  Location: [facility];  Service: Ophthalmology;  Laterality: Left;  . CHOLECYSTECTOMY    . COLONOSCOPY  05/08/2012   ZOX:WRUEAVWU and external hemorrhoids; colonic diverticulosis  . COLONOSCOPY WITH PROPOFOL N/A 02/26/2019   two simple adenomas, diverticulosis,  and internal hemorrhoids.   . ESOPHAGEAL DILATION     "more than 3 times" (01/19/2014)  . ESOPHAGOGASTRODUODENOSCOPY  02/08/11   [place]k-Distal esophageal erosion consistent with mild erosive reflux   esophagitis/ Noncritical Schatzki ring, small hiatal hernia otherwise upper/ gastrointestinal tract appeared unremarkable, status post passage  of a Maloney dilation to biopsy disruption of the ring described  . ESOPHAGOGASTRODUODENOSCOPY  04/2012   2 tandem incomplete distal esophagea rings s/p dilation.   . ESOPHAGOGASTRODUODENOSCOPY N/A 09/16/2014   Dr. Gala Romney: Schatzki ring status post dilation/disruption.  Hiatal hernia.  Marland Kitchen  ESOPHAGOGASTRODUODENOSCOPY (EGD) WITH PROPOFOL N/A 12/08/2018   EGD with mild Schatzki ring s/p dilation, small hiatal hernia, otherwise normal  . EYE SURGERY     cataracts, bilateral  . GIVENS CAPSULE STUDY N/A 09/29/2019   Procedure: GIVENS CAPSULE STUDY;  Surgeon: Daneil Dolin, MD;  [facility];  Service: Endoscopy;  Laterality: N/A;  7:30am  . INCONTINENCE SURGERY  08/26/09   [place]  . JOINT REPLACEMENT     right total knee  . MALONEY DILATION N/A 09/16/2014   Procedure: Venia Minks DILATION;  Surgeon: Daneil Dolin, MD;  [facility];  Service: Endoscopy;  Laterality: N/A;  . Venia Minks DILATION N/A 12/08/2018   Procedure: Venia Minks DILATION;  Surgeon: Daneil Dolin, MD;  [facility];  Service: Endoscopy;  Laterality: N/A;  . PARS PLANA VITRECTOMY W/ REPAIR OF MACULAR  HOLE Left 01/19/2014  . POLYPECTOMY  02/26/2019   Procedure: POLYPECTOMY;  Surgeon: Danie Binder, MD;  [facility];  Service: Endoscopy;;  ascending colon polyps x2  . SAVORY DILATION N/A 09/16/2014   Procedure: SAVORY DILATION;  Surgeon: Daneil Dolin, MD;  [facility];  Service: Endoscopy;  Laterality: N/A;  . TONSILLECTOMY    . TOTAL KNEE ARTHROPLASTY Right 05/13/07   Dr. Aline Brochure  . TRIGGER FINGER RELEASE Right 06/26/2018   Procedure: RIGHT INDEX FINGER TRIGGER FINGER/A-1 PULLEY RELEASE;  Surgeon: Carole Civil, MD;  Location[facility];  Service: Orthopedics;  Laterality: Right;     OB History   No obstetric history on file.     Family History  Problem Relation Age of Onset  . Cancer Mother   . Heart disease Mother   . Cancer Father   . Heart disease Father   . Diabetes Sister   . Hypertension Brother   . Heart disease Brother   . Cancer Sister   . Kidney failure Sister   . Diabetes Son   . Hypertension Son   . Hypertension Daughter   . Colon cancer Neg Hx     Social History   Tobacco Use  . Smoking status: Former Smoker    Packs/day: 0.50    Years: 25.00    Pack years: 12.50    Types: Cigarettes    Quit date: 01/01/2004    Years since quitting: 16.6  . Smokeless tobacco: Never Used  Vaping Use  . Vaping Use: Never used  Substance Use Topics  . Alcohol use: No  . Drug use: No    Home Medications Prior to Admission medications   Medication Sig Start Date End Date Taking? Authorizing Provider  acetaminophen (TYLENOL) 325 MG tablet Take 2 tablets (650 mg total) by mouth every 6 (six) hours as needed for mild pain, fever or headache (or Fever >/= 101). 06/20/20   Emokpae, Courage, MD  albuterol (PROAIR HFA) 108 (90 Base) MCG/ACT inhaler Inhale 2 puffs into the lungs every 6 (six) hours as needed. 08/08/20   Gerlene Fee, NP  brimonidine-timolol (COMBIGAN) 0.2-0.5 % ophthalmic solution Place 1 drop into both eyes every 12 (twelve)  hours. 08/08/20   Gerlene Fee, NP  cetirizine (ZYRTEC) 10 MG tablet TAKE 1 TABLET BY MOUTH ONCE A DAY. 05/17/20   Fayrene Helper, MD  Cholecalciferol (VITAMIN D3) 2000 units TABS Take 2,000 Units by mouth at bedtime.    [provider]  citalopram (CELEXA) 10 MG tablet Take 1 tablet (10 mg total) by mouth daily. 08/08/20   Ok Edwards  S, NP  Cranberry 1000 MG CAPS Take 1,000 mg by mouth daily.    [provider]  cycloSPORINE (RESTASIS) 0.05 % ophthalmic emulsion Place 1 drop into both eyes 2 (two) times daily. 08/08/20   Gerlene Fee, NP  diltiazem (CARDIZEM CD) 120 MG 24 hr capsule Take 1 capsule (120 mg total) by mouth daily. 08/08/20   Gerlene Fee, NP  ezetimibe (ZETIA) 10 MG tablet Take 1 tablet (10 mg total) by mouth daily. 08/08/20   Gerlene Fee, NP  famotidine (PEPCID) 40 MG tablet Take 1 tablet (40 mg total) by mouth every evening. 08/08/20   Gerlene Fee, NP  fluticasone (FLONASE) 50 MCG/ACT nasal spray Place 2 sprays into both nostrils daily. 08/08/20   Gerlene Fee, NP  gabapentin (NEURONTIN) 300 MG capsule TAKE (1) CAPSULE BY MOUTH TWICE DAILY. 08/08/20   Gerlene Fee, NP  hydrocortisone (ANUSOL-HC) 2.5 % rectal cream Place 1 application rectally 2 (two) times daily. 07/01/20   Erenest Rasher, PA-C  insulin lispro (HUMALOG KWIKPEN) 100 UNIT/ML KwikPen Inject 0.03 mLs (3 Units total) into the skin 3 (three) times daily. insulin pen; 100 unit/mL; amt: Per Sliding Scale; If Blood Sugar is 0 to 149, give 0 Units. If Blood Sugar is greater than 149, give 3 Units. subcutaneous Before Meals 08/08/20   Gerlene Fee, NP  Multiple Vitamin (MULTIVITAMIN WITH MINERALS) TABS tablet Take 1 tablet by mouth at bedtime.    [provider]  nystatin (NYSTATIN) powder Apply 1 application topically daily as needed. Special Instructions: fungal rash to groin and inguinal folds 06/20/20   [provider]  ondansetron (ZOFRAN) 4 MG tablet Take 1 tablet  (4 mg total) by mouth every 8 (eight) hours as needed for nausea or vomiting. 07/01/20   Erenest Rasher, PA-C  predniSONE (DELTASONE) 5 MG tablet Take 1 tablet (5 mg total) by mouth daily. 08/08/20   Gerlene Fee, NP  Probiotic Product (RISA-BID PROBIOTIC PO) Take 1 tablet by mouth in the morning and at bedtime.    [provider]  pyridostigmine (MESTINON) 60 MG tablet Take three times daily 08/08/20   Gerlene Fee, NP  rosuvastatin (CRESTOR) 40 MG tablet TAKE (1) TABLET BY MOUTH AT BEDTIME. 08/08/20   Gerlene Fee, NP  solifenacin (VESICARE) 10 MG tablet Take 1 tablet (10 mg total) by mouth daily. 08/08/20   Gerlene Fee, NP  venlafaxine XR (EFFEXOR-XR) 150 MG 24 hr capsule Take 1 capsule (150 mg total) by mouth daily. 08/08/20   Gerlene Fee, NP    Allergies    Patient has no known allergies.  Review of Systems   Review of Systems  Constitutional: Negative for chills and fever.  HENT: Negative for ear pain and sore throat.   Eyes: Negative for pain and visual disturbance.  Respiratory: Negative for cough and shortness of breath.   Cardiovascular: Negative for chest pain and palpitations.  Gastrointestinal: Negative for abdominal pain and vomiting.  Genitourinary: Negative for dysuria and hematuria.  Musculoskeletal: Positive for back pain. Negative for arthralgias.  Skin: Negative for color change and rash.  Neurological: Negative for dizziness, seizures and syncope.  All other systems reviewed and are negative.   Physical Exam Updated Vital Signs BP 116/64   Pulse 70   Temp 97.7 F (36.5 C) (Oral)   Resp 15   Ht 5\' 6"  (1.676 m)   Wt 103.4 kg   SpO2 100%   BMI 36.80  kg/m   Physical Exam Vitals and nursing note reviewed.  Constitutional:      General: She is not in acute distress.    Appearance: She is well-developed. She is obese. She is not ill-appearing or diaphoretic.     Comments: Alert and oriented x3, pleasant  HENT:     Head: Normocephalic  and atraumatic.     Mouth/Throat:     Mouth: Mucous membranes are moist.     Pharynx: Oropharynx is clear.  Eyes:     Conjunctiva/sclera: Conjunctivae normal.  Cardiovascular:     Rate and Rhythm: Normal rate and regular rhythm.     Pulses: Normal pulses.     Heart sounds: Normal heart sounds. No murmur heard.   Pulmonary:     Effort: Pulmonary effort is normal. No respiratory distress.     Breath sounds: Normal breath sounds.  Abdominal:     General: Abdomen is flat.     Palpations: Abdomen is soft.     Tenderness: There is no abdominal tenderness.  Musculoskeletal:        General: Tenderness present. No signs of injury. Normal range of motion.     Cervical back: Normal range of motion and neck supple.     Comments: L-spine midline tenderness, patient reports this is chronic.  Some paraspinal L-spine muscle tenderness.  Moving all 4 extremities without difficulty, 5/5 strength in upper and lower extremities bilaterally.  No C, T spine tenderness.  No visible bruising.  No tenderness to palpation to the hips bilaterally.  Full range of motion of lower extremities.  Skin:    General: Skin is warm and dry.     Findings: No bruising or erythema.  Neurological:     General: No focal deficit present.     Mental Status: She is alert and oriented to person, place, and time.     Sensory: No sensory deficit.     Motor: No weakness.     Comments: Patient noted with equal grip strength in upper extremities, 5/5 strength in lower extremities.  No visible facial droop, smile equal, no evidence of aphasia or slurred speech.  Negative finger-to-nose and Romberg.  Psychiatric:        Mood and Affect: Mood normal.        Behavior: Behavior normal.     ED Results / Procedures / Treatments   Labs (all labs ordered are listed, but only abnormal results are displayed) Labs Reviewed  CBC WITH DIFFERENTIAL/PLATELET - Abnormal; Notable for the following components:      Result Value   WBC 14.2 (*)     RBC 3.51 (*)    Hemoglobin 9.6 (*)    HCT 32.8 (*)    MCHC 29.3 (*)    RDW 18.2 (*)    Platelets 407 (*)    Neutro Abs 11.7 (*)    Abs Immature Granulocytes 0.18 (*)    All other components within normal limits  COMPREHENSIVE METABOLIC PANEL - Abnormal; Notable for the following components:   CO2 19 (*)    Glucose, Bld 162 (*)    Creatinine, Ser 1.46 (*)    Calcium 8.1 (*)    Albumin 2.2 (*)    GFR calc non Af Amer 34 (*)    GFR calc Af Amer 40 (*)    All other components within normal limits  CK  URINALYSIS, ROUTINE W REFLEX MICROSCOPIC    EKG None  Radiology DG Lumbar Spine Complete  Result Date: 08/19/2020 CLINICAL DATA:  Status post fall. EXAM: LUMBAR SPINE - COMPLETE 4+ VIEW COMPARISON:  None. FINDINGS: There is no evidence of an acute lumbar spine fracture. Approximately 3 mm anterolisthesis of the L5 vertebral body on S1 is noted. Moderate severity endplate sclerosis is seen at the levels of L1-L2 and L2-L3. Mild endplate sclerosis is noted throughout the remainder of the lumbar spine. There is moderate to marked severity intervertebral disc space narrowing at the level of L2-L3. IMPRESSION: 1. Approximately 3 mm anterolisthesis of the L5 vertebral body on S1. 2. Moderate to marked severity degenerative disc disease at L2-L3. Electronically Signed   By: Virgina Norfolk M.D.   On: 08/19/2020 20:34   CT Head Wo Contrast  Result Date: 08/19/2020 CLINICAL DATA:  Found after fall, unknown down time EXAM: CT HEAD WITHOUT CONTRAST TECHNIQUE: Contiguous axial images were obtained from the base of the skull through the vertex without intravenous contrast. COMPARISON:  CT 08/12/2020 FINDINGS: Brain: No evidence of acute infarction, hemorrhage, hydrocephalus, extra-axial collection or mass lesion/mass effect. Symmetric prominence of the ventricles, cisterns and sulci compatible with parenchymal volume loss. Patchy areas of white matter hypoattenuation are most compatible with  chronic microvascular angiopathy. Vascular: Atherosclerotic calcification of the carotid siphons and intradural left vertebral artery. No hyperdense vessel. Skull: No calvarial fracture or suspicious osseous lesion. No scalp swelling or hematoma. Mild hyperostosis frontalis interna, a benign often senescent finding. Sinuses/Orbits: Air-fluid level in the left maxillary sinus. Trace left mastoid effusion. Remaining paranasal sinuses and mastoid air cells are predominantly clear. Orbital structures are unremarkable aside from prior lens extractions. Other: None IMPRESSION: 1. No acute intracranial abnormality. No scalp swelling or calvarial fracture. 2. Chronic microvascular angiopathy and parenchymal volume loss. 3. Air-fluid level in the left maxillary sinus. Correlate for acute sinusitis. 4. Trace left mastoid effusion. Electronically Signed   By: Lovena Le M.D.   On: 08/19/2020 20:15   DG Chest Portable 1 View  Result Date: 08/19/2020 CLINICAL DATA:  Found down EXAM: PORTABLE CHEST 1 VIEW COMPARISON:  08/12/2020 FINDINGS: The heart size and mediastinal contours are within normal limits. Both lungs are clear. The visualized skeletal structures are unremarkable. IMPRESSION: No active disease. Electronically Signed   By: Randa Ngo M.D.   On: 08/19/2020 18:42    Procedures Procedures (including critical care time)  Medications Ordered in ED Medications - No data to display  ED Course  I have reviewed the triage vital signs and the nursing notes.  Pertinent labs & imaging results that were available during my care of the patient were reviewed by me and considered in my medical decision making (see chart for details).    MDM Rules/Calculators/A&P                         Patient presents after being found on the ground, discharged from the hospital yesterday and had refused SNF placement On visitation, she is alert oriented x3, nontoxic-appearing, no acute distress, resting comfortably in  the ER bed.  Blood pressure on presentation 120/56, fluctuating between 397 and 673 systolic.  She is afebrile, not hypoxic tachypneic or tachycardic.  No evidence of sepsis.  Physical exam with some midline tenderness to the L-spine, however patient does state that this is old.  Reports questionable head injury but no LOC.  LABS:  I personally reviewed and interpreted her lab work CBC with leukocytosis of 14.2.  Hemoglobin of 9.6 checks it appears to be improved from 2 days ago.  Platelets  of 407. CMP without any significant electrolyte abnormalities, glucose of 162.  Creatinine of 1.46 increased from 1.212 days ago.  Normal LFTs. Normal CK. UA pending.  IMAGING:  CT head with no intracranial abnormalities, there was noted to have been an air-fluid level in the maxillary sinuses.   Plain films of her lumbar spine with 3 m into the anterolisthesis of L5 on S1.  Patient denies any numbness or tingling, no groin numbness, no loss of bowel bladder control. Chest x-ray with no acute abnormalities  EKG:  EKG with normal sinus rhythm, borderline prolonged QT.  MDM: Patient presents to the ED after a fall.  Labs with slightly worsening renal function, no evidence of rhabdo.  Plain films of her lumbar spine does show anterolisthesis of L5 onto S1, however patient denies any red flag signs and no evidence of cauda equina here in the ED.  Blood pressures have been fluctuating, slightly soft.  Patient is a notoriously difficult stick, nursing staff has not been able to achieve an IV and per chart review patient had required EJ at admission last time.  Patient was given some oral fluids instead.  UA still pending.  If this is normal, suspect that the patient will be stable for discharge.  Consulted transition of care to help with SNF placement.  This is still pending.  11:57 PM: UA still pending, patient has not urinated in the ED still.  Patient is a difficult stick, cannot give IV fluids.  Patient given  oral rehydration.  Nursing staff pending in and out.  Signed out to Dr. Rolland Porter who will oversee the UA.  If this is normal, suspect she will be stable for discharge.  She will also need SNF placement, transition of care consult placed.  Final Clinical Impression(s) / ED Diagnoses Final diagnoses:  None    Rx / DC Orders ED Discharge Orders    None         Lyndel Safe 08/20/20 Laqueta Due, MD 08/20/20 2208

## 2020-08-19 NOTE — ED Triage Notes (Signed)
Pt was discharged from here yesterday after refusing SNF placement; pt's daughter found pt in the floor after a fall and is unsure how long she has been on the floor, pt denies injuries; pt reports she know knows she will not be able to stay at home alone, needs SNF

## 2020-08-20 LAB — URINALYSIS, ROUTINE W REFLEX MICROSCOPIC
Bacteria, UA: NONE SEEN
Bilirubin Urine: NEGATIVE
Glucose, UA: NEGATIVE mg/dL
Hgb urine dipstick: NEGATIVE
Ketones, ur: NEGATIVE mg/dL
Leukocytes,Ua: NEGATIVE
Nitrite: NEGATIVE
Protein, ur: 30 mg/dL — AB
Specific Gravity, Urine: 1.019 (ref 1.005–1.030)
pH: 5 (ref 5.0–8.0)

## 2020-08-20 LAB — CULTURE, BLOOD (SINGLE): Special Requests: ADEQUATE

## 2020-08-20 NOTE — ED Notes (Signed)
Bagged lunch given

## 2020-08-21 LAB — CBG MONITORING, ED
Glucose-Capillary: 72 mg/dL (ref 70–99)
Glucose-Capillary: 109 mg/dL — ABNORMAL HIGH (ref 70–99)

## 2020-08-21 MED ORDER — FAMOTIDINE 20 MG PO TABS
40.0000 mg | ORAL_TABLET | Freq: Every evening | ORAL | Status: DC
  Administered 2020-08-21: 40 mg via ORAL
  Filled 2020-08-21: qty 2

## 2020-08-21 MED ORDER — INSULIN ASPART 100 UNIT/ML ~~LOC~~ SOLN
3.0000 [IU] | Freq: Three times a day (TID) | SUBCUTANEOUS | Status: DC
  Administered 2020-08-21 (×2): 3 [IU] via SUBCUTANEOUS
  Filled 2020-08-21 (×2): qty 1

## 2020-08-21 MED ORDER — CRANBERRY 1000 MG PO CAPS: 1000.0000 mg | ORAL_CAPSULE | Freq: Every day | ORAL | Status: DC

## 2020-08-21 MED ORDER — GABAPENTIN 300 MG PO CAPS
300.0000 mg | ORAL_CAPSULE | Freq: Two times a day (BID) | ORAL | Status: DC
  Administered 2020-08-21 – 2020-08-22 (×2): 300 mg via ORAL
  Filled 2020-08-21 (×2): qty 1

## 2020-08-21 MED ORDER — LORATADINE 10 MG PO TABS
10.0000 mg | ORAL_TABLET | Freq: Every day | ORAL | Status: DC
  Administered 2020-08-21 – 2020-08-22 (×2): 10 mg via ORAL
  Filled 2020-08-21 (×2): qty 1

## 2020-08-21 MED ORDER — BRIMONIDINE TARTRATE 0.2 % OP SOLN
1.0000 [drp] | Freq: Two times a day (BID) | OPHTHALMIC | Status: DC
  Administered 2020-08-21 – 2020-08-22 (×2): 1 [drp] via OPHTHALMIC
  Filled 2020-08-21: qty 5

## 2020-08-21 MED ORDER — HYDROCORTISONE (PERIANAL) 2.5 % EX CREA
1.0000 "application " | TOPICAL_CREAM | Freq: Two times a day (BID) | CUTANEOUS | Status: DC
  Filled 2020-08-21: qty 28.35

## 2020-08-21 MED ORDER — PREDNISONE 10 MG PO TABS
5.0000 mg | ORAL_TABLET | Freq: Every day | ORAL | Status: DC
  Administered 2020-08-21 – 2020-08-22 (×2): 5 mg via ORAL
  Filled 2020-08-21 (×2): qty 1

## 2020-08-21 MED ORDER — VENLAFAXINE HCL ER 37.5 MG PO CP24
150.0000 mg | ORAL_CAPSULE | Freq: Every day | ORAL | Status: DC
  Administered 2020-08-21 – 2020-08-22 (×2): 150 mg via ORAL
  Filled 2020-08-21 (×3): qty 4

## 2020-08-21 MED ORDER — VITAMIN D 25 MCG (1000 UNIT) PO TABS: 2000.0000 [IU] | ORAL_TABLET | Freq: Every day | ORAL | Status: DC

## 2020-08-21 MED ORDER — ADULT MULTIVITAMIN W/MINERALS CH: 1.0000 | ORAL_TABLET | Freq: Every day | ORAL | Status: DC

## 2020-08-21 MED ORDER — BACID PO TABS
1.0000 | ORAL_TABLET | Freq: Two times a day (BID) | ORAL | Status: DC
  Filled 2020-08-21 (×4): qty 1

## 2020-08-21 MED ORDER — ONDANSETRON HCL 4 MG PO TABS: 4.0000 mg | ORAL_TABLET | Freq: Three times a day (TID) | ORAL | Status: DC | PRN

## 2020-08-21 MED ORDER — NYSTATIN 100000 UNIT/GM EX POWD: 1.0000 "application " | Freq: Every day | CUTANEOUS | Status: DC | PRN

## 2020-08-21 MED ORDER — ALBUTEROL SULFATE HFA 108 (90 BASE) MCG/ACT IN AERS: 2.0000 | INHALATION_SPRAY | Freq: Four times a day (QID) | RESPIRATORY_TRACT | Status: DC | PRN

## 2020-08-21 MED ORDER — ACETAMINOPHEN 325 MG PO TABS: 650.0000 mg | ORAL_TABLET | Freq: Four times a day (QID) | ORAL | Status: DC | PRN

## 2020-08-21 MED ORDER — BRIMONIDINE TARTRATE-TIMOLOL 0.2-0.5 % OP SOLN: 1.0000 [drp] | Freq: Two times a day (BID) | OPHTHALMIC | Status: DC

## 2020-08-21 MED ORDER — FLUTICASONE PROPIONATE 50 MCG/ACT NA SUSP
2.0000 | Freq: Every day | NASAL | Status: DC
  Administered 2020-08-21 – 2020-08-22 (×2): 2 via NASAL
  Filled 2020-08-21: qty 16

## 2020-08-21 MED ORDER — ROSUVASTATIN CALCIUM 20 MG PO TABS
40.0000 mg | ORAL_TABLET | Freq: Every day | ORAL | Status: DC
  Filled 2020-08-21 (×3): qty 1

## 2020-08-21 MED ORDER — PYRIDOSTIGMINE BROMIDE 60 MG PO TABS
60.0000 mg | ORAL_TABLET | Freq: Three times a day (TID) | ORAL | Status: DC
  Administered 2020-08-21 – 2020-08-22 (×3): 60 mg via ORAL
  Filled 2020-08-21 (×11): qty 1

## 2020-08-21 MED ORDER — DILTIAZEM HCL ER COATED BEADS 120 MG PO CP24
120.0000 mg | ORAL_CAPSULE | Freq: Every day | ORAL | Status: DC
  Administered 2020-08-21: 120 mg via ORAL
  Filled 2020-08-21 (×5): qty 1

## 2020-08-21 MED ORDER — RISAQUAD PO CAPS
1.0000 | ORAL_CAPSULE | Freq: Two times a day (BID) | ORAL | Status: DC
  Administered 2020-08-21: 1 via ORAL
  Filled 2020-08-21 (×7): qty 1

## 2020-08-21 MED ORDER — EZETIMIBE 10 MG PO TABS
10.0000 mg | ORAL_TABLET | Freq: Every day | ORAL | Status: DC
  Administered 2020-08-21: 10 mg via ORAL
  Filled 2020-08-21 (×5): qty 1

## 2020-08-21 MED ORDER — CYCLOSPORINE 0.05 % OP EMUL
1.0000 [drp] | Freq: Two times a day (BID) | OPHTHALMIC | Status: DC
  Administered 2020-08-21: 1 [drp] via OPHTHALMIC
  Filled 2020-08-21 (×8): qty 1

## 2020-08-21 MED ORDER — TIMOLOL MALEATE 0.5 % OP SOLN
1.0000 [drp] | Freq: Two times a day (BID) | OPHTHALMIC | Status: DC
  Administered 2020-08-21 – 2020-08-22 (×2): 1 [drp] via OPHTHALMIC
  Filled 2020-08-21: qty 5

## 2020-08-21 MED ORDER — CITALOPRAM HYDROBROMIDE 10 MG PO TABS
10.0000 mg | ORAL_TABLET | Freq: Every day | ORAL | Status: DC
  Administered 2020-08-21: 10 mg via ORAL
  Filled 2020-08-21 (×5): qty 1

## 2020-08-21 NOTE — ED Provider Notes (Signed)
Patient's home medications had never been written, nursing staff has asked me to write these.   Rolland Porter, MD 08/21/20 [phone]

## 2020-08-21 NOTE — ED Notes (Signed)
Pt took a bed bath with set up only

## 2020-08-21 NOTE — ED Notes (Signed)
Pt placed in hospital bed, pure wick replaced, adult brief changed, pt calm & cooperative, resting at this time, denies needs.

## 2020-08-21 NOTE — ED Notes (Signed)
Pt sleeping at this time, equal rise & fall of chest noted

## 2020-08-22 ENCOUNTER — Encounter (HOSPITAL_COMMUNITY): Payer: Self-pay | Admitting: Internal Medicine

## 2020-08-22 ENCOUNTER — Telehealth: Payer: Self-pay

## 2020-08-22 DIAGNOSIS — E872 Acidosis: Secondary | ICD-10-CM | POA: Diagnosis not present

## 2020-08-22 DIAGNOSIS — Z79899 Other long term (current) drug therapy: Secondary | ICD-10-CM | POA: Diagnosis not present

## 2020-08-22 DIAGNOSIS — R770 Abnormality of albumin: Secondary | ICD-10-CM | POA: Diagnosis not present

## 2020-08-22 DIAGNOSIS — G8929 Other chronic pain: Secondary | ICD-10-CM | POA: Diagnosis not present

## 2020-08-22 DIAGNOSIS — G4730 Sleep apnea, unspecified: Secondary | ICD-10-CM | POA: Diagnosis not present

## 2020-08-22 DIAGNOSIS — J3089 Other allergic rhinitis: Secondary | ICD-10-CM | POA: Diagnosis not present

## 2020-08-22 DIAGNOSIS — I469 Cardiac arrest, cause unspecified: Secondary | ICD-10-CM | POA: Diagnosis not present

## 2020-08-22 DIAGNOSIS — K529 Noninfective gastroenteritis and colitis, unspecified: Secondary | ICD-10-CM | POA: Diagnosis not present

## 2020-08-22 DIAGNOSIS — Z789 Other specified health status: Secondary | ICD-10-CM | POA: Diagnosis not present

## 2020-08-22 DIAGNOSIS — G4733 Obstructive sleep apnea (adult) (pediatric): Secondary | ICD-10-CM | POA: Diagnosis not present

## 2020-08-22 DIAGNOSIS — Z87891 Personal history of nicotine dependence: Secondary | ICD-10-CM | POA: Diagnosis not present

## 2020-08-22 DIAGNOSIS — R571 Hypovolemic shock: Secondary | ICD-10-CM | POA: Diagnosis not present

## 2020-08-22 DIAGNOSIS — N1831 Chronic kidney disease, stage 3a: Secondary | ICD-10-CM | POA: Diagnosis not present

## 2020-08-22 DIAGNOSIS — R413 Other amnesia: Secondary | ICD-10-CM | POA: Diagnosis not present

## 2020-08-22 DIAGNOSIS — N183 Chronic kidney disease, stage 3 unspecified: Secondary | ICD-10-CM | POA: Diagnosis not present

## 2020-08-22 DIAGNOSIS — E875 Hyperkalemia: Secondary | ICD-10-CM | POA: Diagnosis not present

## 2020-08-22 DIAGNOSIS — Z6833 Body mass index (BMI) 33.0-33.9, adult: Secondary | ICD-10-CM | POA: Diagnosis not present

## 2020-08-22 DIAGNOSIS — I700 Atherosclerosis of aorta: Secondary | ICD-10-CM | POA: Diagnosis not present

## 2020-08-22 DIAGNOSIS — R197 Diarrhea, unspecified: Secondary | ICD-10-CM | POA: Diagnosis not present

## 2020-08-22 DIAGNOSIS — K922 Gastrointestinal hemorrhage, unspecified: Secondary | ICD-10-CM | POA: Diagnosis not present

## 2020-08-22 DIAGNOSIS — E785 Hyperlipidemia, unspecified: Secondary | ICD-10-CM | POA: Diagnosis not present

## 2020-08-22 DIAGNOSIS — Z20822 Contact with and (suspected) exposure to covid-19: Secondary | ICD-10-CM | POA: Diagnosis not present

## 2020-08-22 DIAGNOSIS — E669 Obesity, unspecified: Secondary | ICD-10-CM | POA: Diagnosis present

## 2020-08-22 DIAGNOSIS — M545 Low back pain: Secondary | ICD-10-CM | POA: Diagnosis not present

## 2020-08-22 DIAGNOSIS — E8809 Other disorders of plasma-protein metabolism, not elsewhere classified: Secondary | ICD-10-CM | POA: Diagnosis not present

## 2020-08-22 DIAGNOSIS — K219 Gastro-esophageal reflux disease without esophagitis: Secondary | ICD-10-CM | POA: Diagnosis not present

## 2020-08-22 DIAGNOSIS — E1122 Type 2 diabetes mellitus with diabetic chronic kidney disease: Secondary | ICD-10-CM | POA: Diagnosis not present

## 2020-08-22 DIAGNOSIS — D638 Anemia in other chronic diseases classified elsewhere: Secondary | ICD-10-CM | POA: Diagnosis not present

## 2020-08-22 DIAGNOSIS — N189 Chronic kidney disease, unspecified: Secondary | ICD-10-CM | POA: Diagnosis not present

## 2020-08-22 DIAGNOSIS — K5730 Diverticulosis of large intestine without perforation or abscess without bleeding: Secondary | ICD-10-CM | POA: Diagnosis not present

## 2020-08-22 DIAGNOSIS — K5180 Other ulcerative colitis without complications: Secondary | ICD-10-CM | POA: Diagnosis not present

## 2020-08-22 DIAGNOSIS — J449 Chronic obstructive pulmonary disease, unspecified: Secondary | ICD-10-CM | POA: Diagnosis not present

## 2020-08-22 DIAGNOSIS — K5669 Other partial intestinal obstruction: Secondary | ICD-10-CM | POA: Diagnosis not present

## 2020-08-22 DIAGNOSIS — J690 Pneumonitis due to inhalation of food and vomit: Secondary | ICD-10-CM | POA: Diagnosis not present

## 2020-08-22 DIAGNOSIS — Z8701 Personal history of pneumonia (recurrent): Secondary | ICD-10-CM | POA: Diagnosis not present

## 2020-08-22 DIAGNOSIS — K6389 Other specified diseases of intestine: Secondary | ICD-10-CM | POA: Diagnosis not present

## 2020-08-22 DIAGNOSIS — D62 Acute posthemorrhagic anemia: Secondary | ICD-10-CM | POA: Diagnosis not present

## 2020-08-22 DIAGNOSIS — R3915 Urgency of urination: Secondary | ICD-10-CM | POA: Diagnosis not present

## 2020-08-22 DIAGNOSIS — Z96651 Presence of right artificial knee joint: Secondary | ICD-10-CM | POA: Diagnosis not present

## 2020-08-22 DIAGNOSIS — M170 Bilateral primary osteoarthritis of knee: Secondary | ICD-10-CM | POA: Diagnosis not present

## 2020-08-22 DIAGNOSIS — Z452 Encounter for adjustment and management of vascular access device: Secondary | ICD-10-CM | POA: Diagnosis not present

## 2020-08-22 DIAGNOSIS — N170 Acute kidney failure with tubular necrosis: Secondary | ICD-10-CM | POA: Diagnosis not present

## 2020-08-22 DIAGNOSIS — R5383 Other fatigue: Secondary | ICD-10-CM | POA: Diagnosis not present

## 2020-08-22 DIAGNOSIS — E1142 Type 2 diabetes mellitus with diabetic polyneuropathy: Secondary | ICD-10-CM | POA: Diagnosis not present

## 2020-08-22 DIAGNOSIS — I5030 Unspecified diastolic (congestive) heart failure: Secondary | ICD-10-CM | POA: Diagnosis not present

## 2020-08-22 DIAGNOSIS — K639 Disease of intestine, unspecified: Secondary | ICD-10-CM | POA: Diagnosis not present

## 2020-08-22 DIAGNOSIS — D631 Anemia in chronic kidney disease: Secondary | ICD-10-CM | POA: Diagnosis not present

## 2020-08-22 DIAGNOSIS — M6281 Muscle weakness (generalized): Secondary | ICD-10-CM | POA: Diagnosis not present

## 2020-08-22 DIAGNOSIS — G7000 Myasthenia gravis without (acute) exacerbation: Secondary | ICD-10-CM | POA: Diagnosis not present

## 2020-08-22 DIAGNOSIS — E1022 Type 1 diabetes mellitus with diabetic chronic kidney disease: Secondary | ICD-10-CM | POA: Diagnosis not present

## 2020-08-22 DIAGNOSIS — R34 Anuria and oliguria: Secondary | ICD-10-CM | POA: Diagnosis not present

## 2020-08-22 DIAGNOSIS — D649 Anemia, unspecified: Secondary | ICD-10-CM | POA: Diagnosis not present

## 2020-08-22 DIAGNOSIS — Z794 Long term (current) use of insulin: Secondary | ICD-10-CM | POA: Diagnosis not present

## 2020-08-22 DIAGNOSIS — I129 Hypertensive chronic kidney disease with stage 1 through stage 4 chronic kidney disease, or unspecified chronic kidney disease: Secondary | ICD-10-CM | POA: Diagnosis not present

## 2020-08-22 LAB — CBG MONITORING, ED
Glucose-Capillary: 44 mg/dL — CL (ref 70–99)
Glucose-Capillary: 67 mg/dL — ABNORMAL LOW (ref 70–99)
Glucose-Capillary: 93 mg/dL (ref 70–99)

## 2020-08-22 LAB — SARS CORONAVIRUS 2 BY RT PCR (HOSPITAL ORDER, PERFORMED IN ~~LOC~~ HOSPITAL LAB): SARS Coronavirus 2: NEGATIVE

## 2020-08-22 NOTE — Telephone Encounter (Signed)
Cindy with [facility] needs to Confirm that the pt is actively seeing Dr. Moshe Cipro and that she agrees with the orders for Nursing, Physical Therapy, Occupational Therapy and aide

## 2020-08-22 NOTE — ED Notes (Signed)
Pt refusing breakfast tray; pt given orange juice to increase blood glucose level

## 2020-08-22 NOTE — Telephone Encounter (Signed)
I spoke directly with the daughter this morning , pt is still in the hospital

## 2020-08-22 NOTE — Telephone Encounter (Signed)
Returned call. Jenny Reichmann stated it looked like patient was going to rehab.

## 2020-08-22 NOTE — Progress Notes (Signed)
Inpatient Diabetes Program Recommendations  AACE/ADA: New Consensus Statement on Inpatient Glycemic Control (2015)  Target Ranges:  Prepandial:   less than 140 mg/dL      Peak postprandial:   less than 180 mg/dL (1-2 hours)      Critically ill patients:  140 - 180 mg/dL   Lab Results  Component Value Date   GLUCAP 93 08/22/2020   HGBA1C 6.1 (H) 08/11/2020    Review of Glycemic Control Results for [name], [name] (MRN [id number]) as of 08/22/2020 11:37  Ref. Range 08/22/2020 09:18 08/22/2020 10:01  Glucose-Capillary Latest Ref Range: 70 - 99 mg/dL 44 (LL) 67 (L)   Diabetes history:  DM2  Outpatient Diabetes medications:  Humalog 3 units tid  Prednisone 5 mg daily  Current orders for Inpatient glycemic control:  Novolog 3 units tid  Prednisone 5 units daily  Inpatient Diabetes Program Recommendations:    Hypoglycemic this morning.  Patient refused to eat.  Might consider,  Novolog 0-6 units tid with meal and discontinuing Novolog 3 units tid with meals as po intake is poor.  Will continue to follow while inpatient.  Thank you, Reche Dixon, RN, BSN Diabetes Coordinator Inpatient Diabetes Program [phone] (team pager from 8a-5p)

## 2020-08-22 NOTE — NC FL2 (Signed)
[place] LEVEL OF CARE SCREENING TOOL     IDENTIFICATION  Patient Name: [name] Birthdate: 24-Jan-1941 Sex: female Admission Date (Current Location): 08/19/2020  [facility] and [place] Number:  [employer] and Address:  [place] [address], [place]      Provider Number: [id number]  Attending Physician Name and Address:  Default, Provider, MD  Relative Name and Phone Number:  [name] - Daughter  [phone]    Current Level of Care: Hospital Recommended Level of Care: [place] Prior Approval Number:    Date Approved/Denied:   PASRR Number: [id number]  Discharge Plan: SNF    Current Diagnoses: Patient Active Problem List   Diagnosis Date Noted  . Altered mental status 08/13/2020  . Hyperglycemia 08/13/2020  . Generalized weakness 08/13/2020  . Hypoalbuminemia 08/13/2020  . Hospital discharge follow-up 08/12/2020  . Diarrhea 07/15/2020  . GI bleed 07/03/2020  . Abnormal CT scan, colon 07/02/2020  . Normochromic normocytic anemia 06/22/2020  . Hypertension associated with stage 3 chronic kidney disease due to type 2 diabetes mellitus ([place]) 06/21/2020  . Chronic non-seasonal allergic rhinitis 06/21/2020  . Type 2 diabetes mellitus with diabetic neuropathy, unspecified ([place]) 06/21/2020  . Hyperlipidemia associated with type 2 diabetes mellitus ([place]) 06/21/2020  . Diabetic peripheral neuropathy associated with type 2 diabetes mellitus ([place]) 06/21/2020  . Urinary urgency 06/21/2020  . Pelvic mass in female 06/21/2020  . Acute blood loss anemia 06/21/2020  . COPD (chronic obstructive pulmonary disease) ([place]) 06/21/2020  . Increased intraocular pressure, bilateral 06/21/2020  . Enteritis due to Clostridium difficile 06/18/2020  . Acute colitis 06/12/2020  . Hypomagnesemia 06/12/2020  . Colitis 06/12/2020  . Osteoarthritis of both knees 05/24/2020  . Elevated alkaline phosphatase  measurement 10/21/2019  . Back pain 07/05/2019  . Rectal bleeding 01/08/2019  . Chronic constipation 01/08/2019  . Nausea without vomiting 11/03/2018  . Trigger finger, right index finger   . Monoclonal gammopathy of unknown significance (MGUS) 11/26/2017  . Memory loss of unknown cause 11/10/2017  . Chronic left shoulder pain 09/14/2017  . Anemia in chronic renal disease 12/30/2015  . Morbid obesity ([place]) 04/18/2015  . Myasthenia gravis in remission ([place]) 11/24/2014  . Myasthenia gravis ([place]) 11/16/2014  . Vitamin D deficiency 05/24/2014  . Osteopenia 04/26/2014  . Macular hole of left eye 01/19/2014  . Generalized osteoarthritis 05/25/2013  . Esophageal dysphagia 04/03/2012  . Abnormal chest CT 03/02/2012  . Insomnia 01/02/2012  . Malignant hypertension 05/30/2011  . Schatzki's ring 05/07/2011  . INGROWN TOENAIL 01/10/2011  . Unspecified glaucoma 08/08/2010  . DYSCHROMIA, UNSPECIFIED 08/08/2010  . CKD stage 3 due to type 2 diabetes mellitus ([place]) 09/14/2009  . HIP PAIN, LEFT 06/09/2009  . Urinary incontinence 06/09/2009  . Obesity (BMI 30.0-34.9) 03/06/2009  . IDA (iron deficiency anemia) 10/02/2008  . Hypothyroidism 09/27/2008  . Hyperlipemia 06/30/2008  . Depression with anxiety 06/30/2008  . GERD 06/30/2008  . Obstructive sleep apnea 06/30/2008  . CARPAL TUNNEL SYNDROME 05/19/2008  . SHOULDER PAIN 02/02/2008  . IMPINGEMENT SYNDROME 02/02/2008  . DEGENERATIVE JOINT DISEASE, KNEE 11/24/2007  . Type 2 diabetes mellitus ([place]) 09/09/2007    Orientation RESPIRATION BLADDER Height & Weight     Self, Time, Situation, Place  Normal Incontinent Weight: 228 lb (103.4 kg) Height:  5\' 6"  (167.6 cm)  BEHAVIORAL SYMPTOMS/MOOD NEUROLOGICAL BOWEL NUTRITION STATUS      Incontinent Diet (Soft diet)  AMBULATORY STATUS COMMUNICATION OF NEEDS Skin  Extensive Assist Verbally Normal                       Personal Care Assistance Level of Assistance  Bathing, Feeding,  Dressing Bathing Assistance: Limited assistance Feeding assistance: Independent Dressing Assistance: Limited assistance     Functional Limitations Info  Sight, Hearing, Speech Sight Info: Impaired Hearing Info: Adequate Speech Info: Adequate    SPECIAL CARE FACTORS FREQUENCY  PT (By licensed PT)     PT Frequency: 5x's/week              Contractures Contractures Info: Not present    Additional Factors Info  Psychotropic, Code Status, Allergies Code Status Info: Full Allergies Info: NKA Psychotropic Info: Celexa, neurontin, Effexor         Current Medications (08/22/2020):  This is the current hospital active medication list Current Facility-Administered Medications  Medication Dose Route Frequency Provider Last Rate Last Admin  . acetaminophen (TYLENOL) tablet 650 mg  650 mg Oral Q6H PRN Rolland Porter, MD      . acidophilus (RISAQUAD) capsule 1 capsule  1 capsule Oral BID Milton Ferguson, MD   1 capsule at 08/21/20 1013  . albuterol (VENTOLIN HFA) 108 (90 Base) MCG/ACT inhaler 2 puff  2 puff Inhalation Q6H PRN Rolland Porter, MD      . brimonidine (ALPHAGAN) 0.2 % ophthalmic solution 1 drop  1 drop Both Eyes Q12H Rolland Porter, MD   1 drop at 08/21/20 1019   And  . timolol (TIMOPTIC) 0.5 % ophthalmic solution 1 drop  1 drop Both Eyes Q12H Rolland Porter, MD   1 drop at 08/21/20 1019  . cholecalciferol (VITAMIN D3) tablet 2,000 Units  2,000 Units Oral QHS Rolland Porter, MD      . citalopram (CELEXA) tablet 10 mg  10 mg Oral Daily Rolland Porter, MD   10 mg at 08/21/20 1012  . cycloSPORINE (RESTASIS) 0.05 % ophthalmic emulsion 1 drop  1 drop Both Eyes BID Rolland Porter, MD   1 drop at 08/21/20 1017  . diltiazem (CARDIZEM CD) 24 hr capsule 120 mg  120 mg Oral Daily Rolland Porter, MD   120 mg at 08/21/20 1014  . ezetimibe (ZETIA) tablet 10 mg  10 mg Oral Daily Rolland Porter, MD   10 mg at 08/21/20 1014  . famotidine (PEPCID) tablet 40 mg  40 mg Oral QPM Rolland Porter, MD   40 mg at 08/21/20 1845  .  fluticasone (FLONASE) 50 MCG/ACT nasal spray 2 spray  2 spray Each Nare Daily Rolland Porter, MD   2 spray at 08/21/20 1011  . gabapentin (NEURONTIN) capsule 300 mg  300 mg Oral BID Rolland Porter, MD   300 mg at 08/21/20 1014  . hydrocortisone (ANUSOL-HC) 2.5 % rectal cream 1 application  1 application Rectal BID Rolland Porter, MD      . insulin aspart (novoLOG) injection 3 Units  3 Units Subcutaneous TID WC Rolland Porter, MD   3 Units at 08/21/20 1745  . loratadine (CLARITIN) tablet 10 mg  10 mg Oral Daily Rolland Porter, MD   10 mg at 08/21/20 1014  . multivitamin with minerals tablet 1 tablet  1 tablet Oral QHS Rolland Porter, MD      . nystatin (MYCOSTATIN/NYSTOP) topical powder 1 application  1 application Topical Daily PRN Rolland Porter, MD      . ondansetron (ZOFRAN) tablet 4 mg  4 mg Oral Q8H PRN Rolland Porter, MD      .  predniSONE (DELTASONE) tablet 5 mg  5 mg Oral Daily Rolland Porter, MD   5 mg at 08/21/20 1015  . pyridostigmine (MESTINON) tablet 60 mg  60 mg Oral TID Rolland Porter, MD   60 mg at 08/21/20 1627  . rosuvastatin (CRESTOR) tablet 40 mg  40 mg Oral QHS Rolland Porter, MD      . venlafaxine XR (EFFEXOR-XR) 24 hr capsule 150 mg  150 mg Oral Daily Rolland Porter, MD   150 mg at 08/21/20 1015   Current Outpatient Medications  Medication Sig Dispense Refill  . acetaminophen (TYLENOL) 325 MG tablet Take 2 tablets (650 mg total) by mouth every 6 (six) hours as needed for mild pain, fever or headache (or Fever >/= 101). 12 tablet 1  . albuterol (PROAIR HFA) 108 (90 Base) MCG/ACT inhaler Inhale 2 puffs into the lungs every 6 (six) hours as needed. 8.5 g 0  . brimonidine-timolol (COMBIGAN) 0.2-0.5 % ophthalmic solution Place 1 drop into both eyes every 12 (twelve) hours. 5 mL 0  . cetirizine (ZYRTEC) 10 MG tablet TAKE 1 TABLET BY MOUTH ONCE A DAY. 30 tablet 0  . Cholecalciferol (VITAMIN D3) 2000 units TABS Take 2,000 Units by mouth at bedtime.    . citalopram (CELEXA) 10 MG tablet Take 1 tablet (10 mg total) by mouth daily.  30 tablet 0  . Cranberry 1000 MG CAPS Take 1,000 mg by mouth daily.    . cycloSPORINE (RESTASIS) 0.05 % ophthalmic emulsion Place 1 drop into both eyes 2 (two) times daily. 5.5 mL 0  . diltiazem (CARDIZEM CD) 120 MG 24 hr capsule Take 1 capsule (120 mg total) by mouth daily. 30 capsule 0  . ezetimibe (ZETIA) 10 MG tablet Take 1 tablet (10 mg total) by mouth daily. 30 tablet 0  . famotidine (PEPCID) 40 MG tablet Take 1 tablet (40 mg total) by mouth every evening. 30 tablet 0  . fluticasone (FLONASE) 50 MCG/ACT nasal spray Place 2 sprays into both nostrils daily. 9.9 mL 0  . gabapentin (NEURONTIN) 300 MG capsule TAKE (1) CAPSULE BY MOUTH TWICE DAILY. 60 capsule 0  . hydrocortisone (ANUSOL-HC) 2.5 % rectal cream Place 1 application rectally 2 (two) times daily. 30 g 2  . insulin lispro (HUMALOG KWIKPEN) 100 UNIT/ML KwikPen Inject 0.03 mLs (3 Units total) into the skin 3 (three) times daily. insulin pen; 100 unit/mL; amt: Per Sliding Scale; If Blood Sugar is 0 to 149, give 0 Units. If Blood Sugar is greater than 149, give 3 Units. subcutaneous Before Meals 15 mL 0  . Multiple Vitamin (MULTIVITAMIN WITH MINERALS) TABS tablet Take 1 tablet by mouth at bedtime.    Marland Kitchen nystatin (NYSTATIN) powder Apply 1 application topically daily as needed. Special Instructions: fungal rash to groin and inguinal folds    . ondansetron (ZOFRAN) 4 MG tablet Take 1 tablet (4 mg total) by mouth every 8 (eight) hours as needed for nausea or vomiting. 30 tablet 0  . predniSONE (DELTASONE) 5 MG tablet Take 1 tablet (5 mg total) by mouth daily. 30 tablet 0  . Probiotic Product (RISA-BID PROBIOTIC PO) Take 1 tablet by mouth in the morning and at bedtime.    . pyridostigmine (MESTINON) 60 MG tablet Take three times daily 90 tablet 0  . rosuvastatin (CRESTOR) 40 MG tablet TAKE (1) TABLET BY MOUTH AT BEDTIME. 30 tablet 0  . solifenacin (VESICARE) 10 MG tablet Take 1 tablet (10 mg total) by mouth daily. 30 tablet 0  . venlafaxine XR  (  EFFEXOR-XR) 150 MG 24 hr capsule Take 1 capsule (150 mg total) by mouth daily. 30 capsule 0     Discharge Medications: Please see discharge summary for a list of discharge medications.  Relevant Imaging Results:  Relevant Lab Results:   Additional Information SS#: [id number]  Sherie Don, LCSW

## 2020-08-22 NOTE — Discharge Summary (Signed)
This very pleasant 79 year old female presents with ongoing weakness and frequent falls in need of a higher level of care,  Work-up in the emergency department showed that the patient has a chronic anemia, mild chronic renal insufficiency and poor general nutrition with a low albumin, no signs of urinary tract infection and x-rays of her lower spine showed no acute fractures, CT scan of the brain showed no signs of acute stroke, bleeding, hemorrhage or trauma.  This patient will need to go to a skilled nursing facility, her vital signs are normal, please see below  Vitals:   08/21/20 1123 08/22/20 0424  BP: (!) 178/73 (!) 157/70  Pulse: 80 72  Resp:    Temp:  98.6 F (37 C)  SpO2: 100% 100%   Discharge summary After visit summary will include medicines  She should be seen by family doctor within the week for repeat evaluation.  Dr. Sabra Heck [phone] [facility]

## 2020-08-22 NOTE — ED Notes (Signed)
Patient given orange juice to brink to bring her blood sugar back up.  She refused to eat any of the clear liquids she had on her diet tray..  The only thing she would drink was orange juice.

## 2020-08-22 NOTE — TOC Transition Note (Signed)
Transition of Care [facility]) - CM/SW Discharge Note  Patient Details  Name: [name] MRN: [id number] Date of Birth: Aug 01, 1941  [facility] of [facility] CM/SW Contact:  Sherie Don, LCSW Phone Number: 08/22/2020, 11:49 AM  Clinical Narrative: Patient agreeable to discharge to [facility]. CSW confirmed bed offer with Ebony Hail with [facility]. SNF transfer report and FL2 faxed to [facility]. AC to order rapid COVID test. Patient will go to room 407-2 and the number to call for report is [phone]. CSW called patient's daughter, [name], to notify her of transfer. Negative COVID test result received. RN, Otila Kluver, updated. TOC signing off.  Final next level of care: Skilled Nursing Facility Barriers to Discharge: Barriers Resolved  Patient Goals and CMS Choice Patient states their goals for this hospitalization and ongoing recovery are:: Discharge to [facility] CMS Medicare.gov Compare Post Acute Care list provided to:: Patient Choice offered to / list presented to : Patient  Discharge Placement Existing PASRR number confirmed : 08/22/20          Patient chooses bed at: [facility] Patient to be transferred to facility by: [place] Name of family member notified: [name] Patient and family notified of of transfer: 08/22/20  Discharge Plan and Services         DME Arranged: N/A DME Agency: NA HH Arranged: NA [place] Agency: NA  Readmission Risk Interventions Readmission Risk Prevention Plan 07/07/2020  Transportation Screening Complete  Medication Review Press photographer) Complete  PCP or Specialist appointment within 3-5 days of discharge Complete  HRI or [place] Not Complete  SW Recovery Care/Counseling Consult Complete  Palliative Care Screening Not [place] Complete  Some recent data might be hidden

## 2020-08-23 ENCOUNTER — Ambulatory Visit: Payer: Medicare Other | Admitting: General Surgery

## 2020-08-23 ENCOUNTER — Encounter: Payer: Self-pay | Admitting: General Surgery

## 2020-08-23 ENCOUNTER — Ambulatory Visit (INDEPENDENT_AMBULATORY_CARE_PROVIDER_SITE_OTHER): Payer: Medicare Other | Admitting: General Surgery

## 2020-08-23 ENCOUNTER — Other Ambulatory Visit: Payer: Self-pay

## 2020-08-23 VITALS — BP 136/80 | HR 80 | Temp 98.7°F | Resp 12 | Ht 66.0 in | Wt 228.0 lb

## 2020-08-23 DIAGNOSIS — K6389 Other specified diseases of intestine: Secondary | ICD-10-CM | POA: Diagnosis not present

## 2020-08-23 NOTE — H&P (Signed)
[name]; [id number]; May 12, 1941   HPI Patient is a 79 year old black female with multiple medical problems who was referred to my care by Dr. Moshe Cipro and Dr. Abbey Chatters of gastroenterology for evaluation and treatment of a near obstructing sigmoid colon mass.  This was found on recent admission and work-up for lower abdominal discomfort and blood in her stools.  Patient initially was to be discharged to skilled nursing unit but she refused and went home.  Since that time, she has returned to the emergency room multiple times and now is at the [facility].  She states that she has had decreased appetite for some time now.  Her bowel movements are more loose and watery in nature.  She denies having a solid bowel movement.  She denies any nausea or vomiting.  She has been on a liquid diet since discharge. Past Medical History:  Diagnosis Date  . Anemia in chronic renal disease 12/30/2015  . Anxiety   . Arthritis    "all over" (01/19/2014)  . Arthritis, lumbar spine   . Carpal tunnel syndrome   . Chronic bronchitis ([place])    "got it q yr for awhile; hasn't had it in awhile" (01/19/2014)  . Chronic kidney disease (CKD) stage G3b/A1, moderately decreased glomerular filtration rate (GFR) between 30-44 mL/min/1.73 square meter and albuminuria creatinine ratio less than 30 mg/g 09/14/2009   Qualifier: Diagnosis of  By: Moshe Cipro MD, Joycelyn Schmid    . Complication of anesthesia    combative  . COPD (chronic obstructive pulmonary disease) ([place])   . Depression   . Diverticulosis 07/2004   Colonscopy Dr Gala Romney  . Esophagitis, erosive 2009  . Exertional shortness of breath   . Gastroesophageal reflux   . RJJOACZY(606.3)    "usually a couple times/wk" (01/19/2014)  . Heart murmur    saw cardiology In [place], he told her she did not need to come back.  . Hyperlipidemia   . Hypertension   . Impingement syndrome, shoulder   . Iron deficiency anemia   . Mitral regurgitation   . Myasthenia gravis   .  Myasthenia gravis in remission ([place]) 11/24/2014  . Obesity   . OSA on CPAP    Negative on last sleep study  . Pneumonia 01/2012  . Pulmonary HTN ([place])   . Schatzki's ring    Last EGD w/ dilation 02/08/11, 2009 & 2007  . Seasonal allergies   . Shoulder pain   . Thyroid disease    "used to take RX; they took me off it" (01/19/2014)  . Tobacco abuse   . Type II diabetes mellitus ([place])     Past Surgical History:  Procedure Laterality Date  . [place] VITRECTOMY WITH 20 GAUGE MVR PORT Left 01/19/2014   Procedure: 25 GAUGE PARS PLANA VITRECTOMY WITH 20 GAUGE MVR PORT; MEMBRAME PEEL; SERUM PATCH; LASER TREATMENT; C3F8;  Surgeon: Hayden Pedro, MD;  Location: [place];  Service: Ophthalmology;  Laterality: Left;  . ABDOMINAL HYSTERECTOMY    . AGILE CAPSULE N/A 08/05/2019   Procedure: AGILE CAPSULE;  Surgeon: Daneil Dolin, MD;  [facility];  Service: Endoscopy;  Laterality: N/A;  7:30am  . APPENDECTOMY    . BIOPSY  08/18/2020   Procedure: BIOPSY;  Surgeon: Eloise Harman, DO;  [facility];  Service: Endoscopy;;  sigmoid colon  . CARPAL TUNNEL RELEASE Bilateral   . CATARACT EXTRACTION W/PHACO Left 10/21/2013   Procedure: LEFT CATARACT EXTRACTION PHACO AND INTRAOCULAR LENS PLACEMENT (IOC);  Surgeon: Marylynn Pearson, MD;  Location: [place];  Service: Ophthalmology;  Laterality: Left;  . CHOLECYSTECTOMY    . COLONOSCOPY  05/08/2012   [id number] and external hemorrhoids; colonic diverticulosis  . COLONOSCOPY WITH PROPOFOL N/A 02/26/2019   two simple adenomas, diverticulosis,  and internal hemorrhoids.   . ESOPHAGEAL DILATION     "more than 3 times" (01/19/2014)  . ESOPHAGOGASTRODUODENOSCOPY  02/08/11   Rourk-Distal esophageal erosion consistent with mild erosive reflux   esophagitis/ Noncritical Schatzki ring, small hiatal hernia otherwise upper/ gastrointestinal tract appeared unremarkable, status post passage  of a Maloney dilation to biopsy disruption of the ring  described  . ESOPHAGOGASTRODUODENOSCOPY  04/2012   2 tandem incomplete distal esophagea rings s/p dilation.   . ESOPHAGOGASTRODUODENOSCOPY N/A 09/16/2014   Dr. Gala Romney: Schatzki ring status post dilation/disruption.  Hiatal hernia.  Marland Kitchen ESOPHAGOGASTRODUODENOSCOPY (EGD) WITH PROPOFOL N/A 12/08/2018   EGD with mild Schatzki ring s/p dilation, small hiatal hernia, otherwise normal  . EYE SURGERY     cataracts, bilateral  . FLEXIBLE SIGMOIDOSCOPY  08/18/2020   Procedure: FLEXIBLE SIGMOIDOSCOPY;  Surgeon: Eloise Harman, DO;  [facility];  Service: Endoscopy;;  . GIVENS CAPSULE STUDY N/A 09/29/2019   Procedure: GIVENS CAPSULE STUDY;  Surgeon: Daneil Dolin, MD;  [facility];  Service: Endoscopy;  Laterality: N/A;  7:30am  . INCONTINENCE SURGERY  08/26/09   Tananbaum  . JOINT REPLACEMENT     right total knee  . MALONEY DILATION N/A 09/16/2014   Procedure: Venia Minks DILATION;  Surgeon: Daneil Dolin, MD;  [facility];  Service: Endoscopy;  Laterality: N/A;  . Venia Minks DILATION N/A 12/08/2018   Procedure: Venia Minks DILATION;  Surgeon: Daneil Dolin, MD;  [facility];  Service: Endoscopy;  Laterality: N/A;  . PARS PLANA VITRECTOMY W/ REPAIR OF MACULAR HOLE Left 01/19/2014  . POLYPECTOMY  02/26/2019   Procedure: POLYPECTOMY;  Surgeon: Danie Binder, MD;  [facility];  Service: Endoscopy;;  ascending colon polyps x2  . SAVORY DILATION N/A 09/16/2014   Procedure: SAVORY DILATION;  Surgeon: Daneil Dolin, MD;  [facility];  Service: Endoscopy;  Laterality: N/A;  . TONSILLECTOMY    . TOTAL KNEE ARTHROPLASTY Right 05/13/07   Dr. Aline Brochure  . TRIGGER FINGER RELEASE Right 06/26/2018   Procedure: RIGHT INDEX FINGER TRIGGER FINGER/A-1 PULLEY RELEASE;  Surgeon: Carole Civil, MD;  [facility];  Service: Orthopedics;  Laterality: Right;    Family History  Problem Relation Age of Onset  . Cancer Mother   . Heart disease Mother    . Cancer Father   . Heart disease Father   . Diabetes Sister   . Hypertension Brother   . Heart disease Brother   . Cancer Sister   . Kidney failure Sister   . Diabetes Son   . Hypertension Son   . Hypertension Daughter   . Colon cancer Neg Hx     Current Outpatient Medications on File Prior to Visit  Medication Sig Dispense Refill  . acetaminophen (TYLENOL) 325 MG tablet Take 2 tablets (650 mg total) by mouth every 6 (six) hours as needed for mild pain, fever or headache (or Fever >/= 101). 12 tablet 1  . albuterol (PROAIR HFA) 108 (90 Base) MCG/ACT inhaler Inhale 2 puffs into the lungs every 6 (six) hours as needed. 8.5 g 0  . brimonidine-timolol (COMBIGAN) 0.2-0.5 % ophthalmic solution Place 1 drop into both eyes every 12 (twelve) hours.  5 mL 0  . cetirizine (ZYRTEC) 10 MG tablet TAKE 1 TABLET BY MOUTH ONCE A DAY. 30 tablet 0  . Cholecalciferol (VITAMIN D3) 2000 units TABS Take 2,000 Units by mouth at bedtime.    . citalopram (CELEXA) 10 MG tablet Take 1 tablet (10 mg total) by mouth daily. 30 tablet 0  . Cranberry 1000 MG CAPS Take 1,000 mg by mouth daily.    . cycloSPORINE (RESTASIS) 0.05 % ophthalmic emulsion Place 1 drop into both eyes 2 (two) times daily. 5.5 mL 0  . diltiazem (CARDIZEM CD) 120 MG 24 hr capsule Take 1 capsule (120 mg total) by mouth daily. 30 capsule 0  . ezetimibe (ZETIA) 10 MG tablet Take 1 tablet (10 mg total) by mouth daily. 30 tablet 0  . famotidine (PEPCID) 40 MG tablet Take 1 tablet (40 mg total) by mouth every evening. 30 tablet 0  . fluticasone (FLONASE) 50 MCG/ACT nasal spray Place 2 sprays into both nostrils daily. 9.9 mL 0  . gabapentin (NEURONTIN) 300 MG capsule TAKE (1) CAPSULE BY MOUTH TWICE DAILY. 60 capsule 0  . hydrocortisone (ANUSOL-HC) 2.5 % rectal cream Place 1 application rectally 2 (two) times daily. 30 g 2  . insulin lispro (HUMALOG KWIKPEN) 100 UNIT/ML KwikPen Inject 0.03 mLs (3 Units total) into the skin 3 (three) times daily. insulin  pen; 100 unit/mL; amt: Per Sliding Scale; If Blood Sugar is 0 to 149, give 0 Units. If Blood Sugar is greater than 149, give 3 Units. subcutaneous Before Meals 15 mL 0  . Multiple Vitamin (MULTIVITAMIN WITH MINERALS) TABS tablet Take 1 tablet by mouth at bedtime.    Marland Kitchen nystatin (NYSTATIN) powder Apply 1 application topically daily as needed. Special Instructions: fungal rash to groin and inguinal folds    . ondansetron (ZOFRAN) 4 MG tablet Take 1 tablet (4 mg total) by mouth every 8 (eight) hours as needed for nausea or vomiting. 30 tablet 0  . predniSONE (DELTASONE) 5 MG tablet Take 1 tablet (5 mg total) by mouth daily. 30 tablet 0  . Probiotic Product (RISA-BID PROBIOTIC PO) Take 1 tablet by mouth in the morning and at bedtime.    . pyridostigmine (MESTINON) 60 MG tablet Take three times daily 90 tablet 0  . rosuvastatin (CRESTOR) 40 MG tablet TAKE (1) TABLET BY MOUTH AT BEDTIME. 30 tablet 0  . solifenacin (VESICARE) 10 MG tablet Take 1 tablet (10 mg total) by mouth daily. 30 tablet 0  . venlafaxine XR (EFFEXOR-XR) 150 MG 24 hr capsule Take 1 capsule (150 mg total) by mouth daily. 30 capsule 0   No current facility-administered medications on file prior to visit.    No Known Allergies  Social History   Substance and Sexual Activity  Alcohol Use No    Social History   Tobacco Use  Smoking Status Former Smoker  . Packs/day: 0.50  . Years: 25.00  . Pack years: 12.50  . Types: Cigarettes  . Quit date: 01/01/2004  . Years since quitting: 16.6  Smokeless Tobacco Never Used    Review of Systems  Constitutional: Positive for malaise/fatigue.  HENT: Positive for sinus pain.   Eyes: Positive for blurred vision and pain.  Respiratory: Positive for wheezing.   Cardiovascular: Negative.   Gastrointestinal: Positive for abdominal pain and blood in stool.  Genitourinary: Negative.   Musculoskeletal: Positive for back pain, joint pain and neck pain.  Skin: Negative.   Neurological:  Negative.   Endo/Heme/Allergies: Negative.   Psychiatric/Behavioral: Negative.  Objective   Vitals:   08/23/20 1300  BP: 136/80  Pulse: 80  Resp: 12  Temp: 98.7 F (37.1 C)  SpO2: 98%    Physical Exam Vitals reviewed.  Constitutional:      Appearance: Normal appearance. She is obese. She is not ill-appearing.  HENT:     Head: Normocephalic and atraumatic.  Cardiovascular:     Rate and Rhythm: Normal rate and regular rhythm.     Heart sounds: Normal heart sounds. No murmur heard.  No friction rub. No gallop.   Pulmonary:     Effort: Pulmonary effort is normal. No respiratory distress.     Breath sounds: Normal breath sounds. No stridor. No wheezing, rhonchi or rales.  Abdominal:     General: Bowel sounds are normal. There is no distension.     Palpations: Abdomen is soft. There is mass.     Tenderness: There is no abdominal tenderness. There is no guarding or rebound.     Hernia: No hernia is present.     Comments: Fullness noted in the left lower quadrant to palpation.  It is somewhat tender to deep palpation.  No rigidity is noted.  Skin:    General: Skin is warm and dry.  Neurological:     Mental Status: She is alert and oriented to person, place, and time.    Previous admission notes, GI notes, CT scan of the abdomen, pathology reports reviewed Assessment  Near obstructing sigmoid colon mass.  Even though the biopsies are negative, this is highly suspicious for malignancy.  This was explained to the patient and family.  She is at higher risk for surgical intervention, but I am worried that she has an impending colonic obstruction secondary to this mass. Plan   Patient is scheduled for the partial colectomy on 08/15/2020.  The risks and benefits of the procedure including bleeding, infection, cardiopulmonary difficulties, the possible need for blood transfusion, and the possible need for colostomy were fully explained to the patient, who gave informed consent.  This  procedure is a priority 1 given the near obstructing nature of the mass.  She will require readmission back to [facility] for rehabilitation after surgery.  I will contact [facility] for bowel preparation.  All questions were answered.

## 2020-08-23 NOTE — Progress Notes (Signed)
[name]; [id number]; [birth year]   HPI Patient is a 79 year old black female with multiple medical problems who was referred to my care by Dr. Moshe Cipro and Dr. Abbey Chatters of gastroenterology for evaluation and treatment of a near obstructing sigmoid colon mass.  This was found on recent admission and work-up for lower abdominal discomfort and blood in her stools.  Patient initially was to be discharged to skilled nursing unit but she refused and went home.  Since that time, she has returned to the emergency room multiple times and now is at the [facility].  She states that she has had decreased appetite for some time now.  Her bowel movements are more loose and watery in nature.  She denies having a solid bowel movement.  She denies any nausea or vomiting.  She has been on a liquid diet since discharge. Past Medical History:  Diagnosis Date  . Anemia in chronic renal disease 12/30/2015  . Anxiety   . Arthritis    "all over" (01/19/2014)  . Arthritis, lumbar spine   . Carpal tunnel syndrome   . Chronic bronchitis ([place])    "got it q yr for awhile; hasn't had it in awhile" (01/19/2014)  . Chronic kidney disease (CKD) stage G3b/A1, moderately decreased glomerular filtration rate (GFR) between 30-44 mL/min/1.73 square meter and albuminuria creatinine ratio less than 30 mg/g 09/14/2009   Qualifier: Diagnosis of  By: Moshe Cipro MD, Joycelyn Schmid    . Complication of anesthesia    combative  . COPD (chronic obstructive pulmonary disease) ([place])   . Depression   . Diverticulosis 07/2004   Colonscopy Dr Gala Romney  . Esophagitis, erosive 2009  . Exertional shortness of breath   . Gastroesophageal reflux   . VOJJKKXF(818.2)    "usually a couple times/wk" (01/19/2014)  . Heart murmur    saw cardiology In [place], he told her she did not need to come back.  . Hyperlipidemia   . Hypertension   . Impingement syndrome, shoulder   . Iron deficiency anemia   . Mitral regurgitation   . Myasthenia gravis   .  Myasthenia gravis in remission ([place]) 11/24/2014  . Obesity   . OSA on CPAP    Negative on last sleep study  . Pneumonia 01/2012  . Pulmonary HTN ([place])   . Schatzki's ring    Last EGD w/ dilation 02/08/11, 2009 & 2007  . Seasonal allergies   . Shoulder pain   . Thyroid disease    "used to take RX; they took me off it" (01/19/2014)  . Tobacco abuse   . Type II diabetes mellitus ([place])     Past Surgical History:  Procedure Laterality Date  . [place] VITRECTOMY WITH 20 GAUGE MVR PORT Left 01/19/2014   Procedure: 25 GAUGE PARS PLANA VITRECTOMY WITH 20 GAUGE MVR PORT; MEMBRAME PEEL; SERUM PATCH; LASER TREATMENT; C3F8;  Surgeon: Hayden Pedro, MD;  Location: [place];  Service: Ophthalmology;  Laterality: Left;  . ABDOMINAL HYSTERECTOMY    . AGILE CAPSULE N/A 08/05/2019   Procedure: AGILE CAPSULE;  Surgeon: Daneil Dolin, MD;  [facility];  Service: Endoscopy;  Laterality: N/A;  7:30am  . APPENDECTOMY    . BIOPSY  08/18/2020   Procedure: BIOPSY;  Surgeon: Eloise Harman, DO;  [facility];  Service: Endoscopy;;  sigmoid colon  . CARPAL TUNNEL RELEASE Bilateral   . CATARACT EXTRACTION W/PHACO Left 10/21/2013   Procedure: LEFT CATARACT EXTRACTION PHACO AND INTRAOCULAR LENS PLACEMENT (IOC);  Surgeon: Marylynn Pearson, MD;  Location: [place];  Service: Ophthalmology;  Laterality: Left;  . CHOLECYSTECTOMY    . COLONOSCOPY  05/08/2012   [id number] and external hemorrhoids; colonic diverticulosis  . COLONOSCOPY WITH PROPOFOL N/A 02/26/2019   two simple adenomas, diverticulosis,  and internal hemorrhoids.   . ESOPHAGEAL DILATION     "more than 3 times" (01/19/2014)  . ESOPHAGOGASTRODUODENOSCOPY  02/08/11   Rourk-Distal esophageal erosion consistent with mild erosive reflux   esophagitis/ Noncritical Schatzki ring, small hiatal hernia otherwise upper/ gastrointestinal tract appeared unremarkable, status post passage  of a Maloney dilation to biopsy disruption of the ring  described  . ESOPHAGOGASTRODUODENOSCOPY  04/2012   2 tandem incomplete distal esophagea rings s/p dilation.   . ESOPHAGOGASTRODUODENOSCOPY N/A 09/16/2014   Dr. Gala Romney: Schatzki ring status post dilation/disruption.  Hiatal hernia.  Marland Kitchen ESOPHAGOGASTRODUODENOSCOPY (EGD) WITH PROPOFOL N/A 12/08/2018   EGD with mild Schatzki ring s/p dilation, small hiatal hernia, otherwise normal  . EYE SURGERY     cataracts, bilateral  . FLEXIBLE SIGMOIDOSCOPY  08/18/2020   Procedure: FLEXIBLE SIGMOIDOSCOPY;  Surgeon: Eloise Harman, DO;  [facility];  Service: Endoscopy;;  . GIVENS CAPSULE STUDY N/A 09/29/2019   Procedure: GIVENS CAPSULE STUDY;  Surgeon: Daneil Dolin, MD;  [facility];  Service: Endoscopy;  Laterality: N/A;  7:30am  . INCONTINENCE SURGERY  08/26/09   Tananbaum  . JOINT REPLACEMENT     right total knee  . MALONEY DILATION N/A 09/16/2014   Procedure: Venia Minks DILATION;  Surgeon: Daneil Dolin, MD;  [facility];  Service: Endoscopy;  Laterality: N/A;  . Venia Minks DILATION N/A 12/08/2018   Procedure: Venia Minks DILATION;  Surgeon: Daneil Dolin, MD;  [facility];  Service: Endoscopy;  Laterality: N/A;  . [place] VITRECTOMY W/ REPAIR OF MACULAR HOLE Left 01/19/2014  . POLYPECTOMY  02/26/2019   Procedure: POLYPECTOMY;  Surgeon: Danie Binder, MD;  [facility];  Service: Endoscopy;;  ascending colon polyps x2  . SAVORY DILATION N/A 09/16/2014   Procedure: SAVORY DILATION;  Surgeon: Daneil Dolin, MD;  [facility];  Service: Endoscopy;  Laterality: N/A;  . TONSILLECTOMY    . TOTAL KNEE ARTHROPLASTY Right 05/13/07   Dr. Aline Brochure  . TRIGGER FINGER RELEASE Right 06/26/2018   Procedure: RIGHT INDEX FINGER TRIGGER FINGER/A-1 PULLEY RELEASE;  Surgeon: Carole Civil, MD;  [facility];  Service: Orthopedics;  Laterality: Right;    Family History  Problem Relation Age of Onset  . Cancer Mother   . Heart disease Mother    . Cancer Father   . Heart disease Father   . Diabetes Sister   . Hypertension Brother   . Heart disease Brother   . Cancer Sister   . Kidney failure Sister   . Diabetes Son   . Hypertension Son   . Hypertension Daughter   . Colon cancer Neg Hx     Current Outpatient Medications on File Prior to Visit  Medication Sig Dispense Refill  . acetaminophen (TYLENOL) 325 MG tablet Take 2 tablets (650 mg total) by mouth every 6 (six) hours as needed for mild pain, fever or headache (or Fever >/= 101). 12 tablet 1  . albuterol (PROAIR HFA) 108 (90 Base) MCG/ACT inhaler Inhale 2 puffs into the lungs every 6 (six) hours as needed. 8.5 g 0  . brimonidine-timolol (COMBIGAN) 0.2-0.5 % ophthalmic solution Place 1 drop into both eyes every 12 (twelve) hours.  5 mL 0  . cetirizine (ZYRTEC) 10 MG tablet TAKE 1 TABLET BY MOUTH ONCE A DAY. 30 tablet 0  . Cholecalciferol (VITAMIN D3) 2000 units TABS Take 2,000 Units by mouth at bedtime.    . citalopram (CELEXA) 10 MG tablet Take 1 tablet (10 mg total) by mouth daily. 30 tablet 0  . Cranberry 1000 MG CAPS Take 1,000 mg by mouth daily.    . cycloSPORINE (RESTASIS) 0.05 % ophthalmic emulsion Place 1 drop into both eyes 2 (two) times daily. 5.5 mL 0  . diltiazem (CARDIZEM CD) 120 MG 24 hr capsule Take 1 capsule (120 mg total) by mouth daily. 30 capsule 0  . ezetimibe (ZETIA) 10 MG tablet Take 1 tablet (10 mg total) by mouth daily. 30 tablet 0  . famotidine (PEPCID) 40 MG tablet Take 1 tablet (40 mg total) by mouth every evening. 30 tablet 0  . fluticasone (FLONASE) 50 MCG/ACT nasal spray Place 2 sprays into both nostrils daily. 9.9 mL 0  . gabapentin (NEURONTIN) 300 MG capsule TAKE (1) CAPSULE BY MOUTH TWICE DAILY. 60 capsule 0  . hydrocortisone (ANUSOL-HC) 2.5 % rectal cream Place 1 application rectally 2 (two) times daily. 30 g 2  . insulin lispro (HUMALOG KWIKPEN) 100 UNIT/ML KwikPen Inject 0.03 mLs (3 Units total) into the skin 3 (three) times daily. insulin  pen; 100 unit/mL; amt: Per Sliding Scale; If Blood Sugar is 0 to 149, give 0 Units. If Blood Sugar is greater than 149, give 3 Units. subcutaneous Before Meals 15 mL 0  . Multiple Vitamin (MULTIVITAMIN WITH MINERALS) TABS tablet Take 1 tablet by mouth at bedtime.    Marland Kitchen nystatin (NYSTATIN) powder Apply 1 application topically daily as needed. Special Instructions: fungal rash to groin and inguinal folds    . ondansetron (ZOFRAN) 4 MG tablet Take 1 tablet (4 mg total) by mouth every 8 (eight) hours as needed for nausea or vomiting. 30 tablet 0  . predniSONE (DELTASONE) 5 MG tablet Take 1 tablet (5 mg total) by mouth daily. 30 tablet 0  . Probiotic Product (RISA-BID PROBIOTIC PO) Take 1 tablet by mouth in the morning and at bedtime.    . pyridostigmine (MESTINON) 60 MG tablet Take three times daily 90 tablet 0  . rosuvastatin (CRESTOR) 40 MG tablet TAKE (1) TABLET BY MOUTH AT BEDTIME. 30 tablet 0  . solifenacin (VESICARE) 10 MG tablet Take 1 tablet (10 mg total) by mouth daily. 30 tablet 0  . venlafaxine XR (EFFEXOR-XR) 150 MG 24 hr capsule Take 1 capsule (150 mg total) by mouth daily. 30 capsule 0   No current facility-administered medications on file prior to visit.    No Known Allergies  Social History   Substance and Sexual Activity  Alcohol Use No    Social History   Tobacco Use  Smoking Status Former Smoker  . Packs/day: 0.50  . Years: 25.00  . Pack years: 12.50  . Types: Cigarettes  . Quit date: 01/01/2004  . Years since quitting: 16.6  Smokeless Tobacco Never Used    Review of Systems  Constitutional: Positive for malaise/fatigue.  HENT: Positive for sinus pain.   Eyes: Positive for blurred vision and pain.  Respiratory: Positive for wheezing.   Cardiovascular: Negative.   Gastrointestinal: Positive for abdominal pain and blood in stool.  Genitourinary: Negative.   Musculoskeletal: Positive for back pain, joint pain and neck pain.  Skin: Negative.   Neurological:  Negative.   Endo/Heme/Allergies: Negative.   Psychiatric/Behavioral: Negative.  Objective   Vitals:   08/23/20 1300  BP: 136/80  Pulse: 80  Resp: 12  Temp: 98.7 F (37.1 C)  SpO2: 98%    Physical Exam Vitals reviewed.  Constitutional:      Appearance: Normal appearance. She is obese. She is not ill-appearing.  HENT:     Head: Normocephalic and atraumatic.  Cardiovascular:     Rate and Rhythm: Normal rate and regular rhythm.     Heart sounds: Normal heart sounds. No murmur heard.  No friction rub. No gallop.   Pulmonary:     Effort: Pulmonary effort is normal. No respiratory distress.     Breath sounds: Normal breath sounds. No stridor. No wheezing, rhonchi or rales.  Abdominal:     General: Bowel sounds are normal. There is no distension.     Palpations: Abdomen is soft. There is mass.     Tenderness: There is no abdominal tenderness. There is no guarding or rebound.     Hernia: No hernia is present.     Comments: Fullness noted in the left lower quadrant to palpation.  It is somewhat tender to deep palpation.  No rigidity is noted.  Skin:    General: Skin is warm and dry.  Neurological:     Mental Status: She is alert and oriented to person, place, and time.    Previous admission notes, GI notes, CT scan of the abdomen, pathology reports reviewed Assessment  Near obstructing sigmoid colon mass.  Even though the biopsies are negative, this is highly suspicious for malignancy.  This was explained to the patient and family.  She is at higher risk for surgical intervention, but I am worried that she has an impending colonic obstruction secondary to this mass. Plan   Patient is scheduled for the partial colectomy on 08/18/2020.  The risks and benefits of the procedure including bleeding, infection, cardiopulmonary difficulties, the possible need for blood transfusion, and the possible need for colostomy were fully explained to the patient, who gave informed consent.  This  procedure is a priority 1 given the near obstructing nature of the mass.  She will require readmission back to [facility] for rehabilitation after surgery.  I will contact [facility] for bowel preparation.  All questions were answered.

## 2020-08-24 ENCOUNTER — Ambulatory Visit (HOSPITAL_COMMUNITY): Payer: Medicare Other | Admitting: Nurse Practitioner

## 2020-08-24 NOTE — Patient Instructions (Signed)
[name]  08/24/2020     @PREFPERIOPPHARMACY @   Your procedure is scheduled on  08/07/2020.  Report to [facility] at  [phone]  A.M.  Call this number if you have problems the morning of surgery:  [phone]   Remember:  Do not eat or drink after midnight. Follow prep instructions given to you by Dr Arnoldo Morale.                      Take these medicines the morning of surgery with A SIP OF WATER  Celexa, diltiazem, gabapentin, zofran(if needed), mestinon, effexor. Take 1/2 of your usual night time insulin the night before your procedure. DO NOT take any medications for diabetes the morning of your procedure.    Do not wear jewelry, make-up or nail polish.  Do not wear lotions, powders, or perfumes. Please wear deodorant and brush your teeth.  Do not shave 48 hours prior to surgery.  Men may shave face and neck.  Do not bring valuables to the hospital.  [facility] is not responsible for any belongings or valuables.  Contacts, dentures or bridgework may not be worn into surgery.  Leave your suitcase in the car.  After surgery it may be brought to your room.  For patients admitted to the hospital, discharge time will be determined by your treatment team.  Patients discharged the day of surgery will not be allowed to drive home.   Name and phone number of your driver:   [facility] employees Special instructions:  DO NOT smoke the morning of your procedure.  Please read over the following fact sheets that you were given. Pain Booklet, Coughing and Deep Breathing, Blood Transfusion Information, Lab Information, Surgical Site Infection Prevention, Anesthesia Post-op Instructions and Care and Recovery After Surgery       Open Colectomy, Care After This sheet gives you information about how to care for yourself after your procedure. Your health care provider may also give you more specific instructions. If you have problems or questions, contact your health care  provider. What can I expect after the procedure? After the procedure, it is common to have:  Pain in your abdomen, especially along your incision.  Tiredness. Your energy level will return to normal over the next several weeks.  Constipation.  Nausea.  Difficulty urinating. Follow these instructions at home: Activity  You may be able to return to most of your normal activities within 1-2 weeks, such as working, walking up stairs, and sexual activity.  Avoid activities that require a lot of energy for 4-6 weeks after surgery, such as running, climbing, and lifting heavy objects. Ask your health care provider what activities are safe for you.  Take rest breaks during the day as needed.  Do not drive for 1-2 weeks or until your health care provider says that it is safe.  Do not drive or use heavy machinery while taking prescription pain medicines.  Do not lift anything that is heavier than 10 lb (4.3 kg) until your health care provider says that it is safe. Incision care   Follow instructions from your health care provider about how to take care of your incision. Make sure you: ? Wash your hands with soap and water before you change your bandage (dressing). If soap and water are not available, use hand sanitizer. ? Change your dressing as told by your health care provider. ? Leave stitches (sutures) or staples in  place. These skin closures may need to stay in place for 2 weeks or longer.  Avoid wearing tight clothing around your incision.  Protect your incision area from the sun.  Check your incision area every day for signs of infection. Check for: ? More redness, swelling, or pain. ? More fluid or blood. ? Warmth. ? Pus or a bad smell. General instructions  Do not take baths, swim, or use a hot tub until your health care provider approves. Ask your health care provider when you may shower.  Take over-the-counter and prescription medicines, including stool softeners,  only as told by your health care provider.  Eat a low-fat and low-fiber diet for the first 4 weeks after surgery.  Keep all follow-up visits as told by your health care provider. This is important. Contact a health care provider if:  You have more redness, swelling, or pain around your incision.  You have more fluid or blood coming from your incision.  Your incision feels warm to the touch.  You have pus or a bad smell coming from your incision.  You have a fever or chills.  You do not have a bowel movement 2-3 days after surgery.  You cannot eat or drink for 24 hours or more.  You have persistent nausea and vomiting.  You have abdominal pain that gets worse and does not get better with medicine. Get help right away if:  You have chest pain.  You have shortness of breath.  You have pain or swelling in your legs.  Your incision breaks open after your sutures or staples have been removed.  You have bleeding from the rectum. This information is not intended to replace advice given to you by your health care provider. Make sure you discuss any questions you have with your health care provider. Document Revised: 11/29/2017 Document Reviewed: 09/17/2016 Elsevier Patient Education  2020 [place] Anesthesia, Adult, Care After This sheet gives you information about how to care for yourself after your procedure. Your health care provider may also give you more specific instructions. If you have problems or questions, contact your health care provider. What can I expect after the procedure? After the procedure, the following side effects are common:  Pain or discomfort at the IV site.  Nausea.  Vomiting.  Sore throat.  Trouble concentrating.  Feeling cold or chills.  Weak or tired.  Sleepiness and fatigue.  Soreness and body aches. These side effects can affect parts of the body that were not involved in surgery. Follow these instructions at  home:  For at least 24 hours after the procedure:  Have a responsible adult stay with you. It is important to have someone help care for you until you are awake and alert.  Rest as needed.  Do not: ? Participate in activities in which you could fall or become injured. ? Drive. ? Use heavy machinery. ? Drink alcohol. ? Take sleeping pills or medicines that cause drowsiness. ? Make important decisions or sign legal documents. ? Take care of children on your own. Eating and drinking  Follow any instructions from your health care provider about eating or drinking restrictions.  When you feel hungry, start by eating small amounts of foods that are soft and easy to digest (bland), such as toast. Gradually return to your regular diet.  Drink enough fluid to keep your urine pale yellow.  If you vomit, rehydrate by drinking water, juice, or clear broth. General instructions  If you have  sleep apnea, surgery and certain medicines can increase your risk for breathing problems. Follow instructions from your health care provider about wearing your sleep device: ? Anytime you are sleeping, including during daytime naps. ? While taking prescription pain medicines, sleeping medicines, or medicines that make you drowsy.  Return to your normal activities as told by your health care provider. Ask your health care provider what activities are safe for you.  Take over-the-counter and prescription medicines only as told by your health care provider.  If you smoke, do not smoke without supervision.  Keep all follow-up visits as told by your health care provider. This is important. Contact a health care provider if:  You have nausea or vomiting that does not get better with medicine.  You cannot eat or drink without vomiting.  You have pain that does not get better with medicine.  You are unable to pass urine.  You develop a skin rash.  You have a fever.  You have redness around your IV  site that gets worse. Get help right away if:  You have difficulty breathing.  You have chest pain.  You have blood in your urine or stool, or you vomit blood. Summary  After the procedure, it is common to have a sore throat or nausea. It is also common to feel tired.  Have a responsible adult stay with you for the first 24 hours after general anesthesia. It is important to have someone help care for you until you are awake and alert.  When you feel hungry, start by eating small amounts of foods that are soft and easy to digest (bland), such as toast. Gradually return to your regular diet.  Drink enough fluid to keep your urine pale yellow.  Return to your normal activities as told by your health care provider. Ask your health care provider what activities are safe for you. This information is not intended to replace advice given to you by your health care provider. Make sure you discuss any questions you have with your health care provider. Document Revised: 12/20/2017 Document Reviewed: 08/02/2017 Elsevier Patient Education  [place]. How to Use Chlorhexidine for Bathing Chlorhexidine gluconate (CHG) is a germ-killing (antiseptic) solution that is used to clean the skin. It can get rid of the bacteria that normally live on the skin and can keep them away for about 24 hours. To clean your skin with CHG, you may be given:  A CHG solution to use in the shower or as part of a sponge bath.  A prepackaged cloth that contains CHG. Cleaning your skin with CHG may help lower the risk for infection:  While you are staying in the intensive care unit of the hospital.  If you have a vascular access, such as a central line, to provide short-term or long-term access to your veins.  If you have a catheter to drain urine from your bladder.  If you are on a ventilator. A ventilator is a machine that helps you breathe by moving air in and out of your lungs.  After surgery. What are  the risks? Risks of using CHG include:  A skin reaction.  Hearing loss, if CHG gets in your ears.  Eye injury, if CHG gets in your eyes and is not rinsed out.  The CHG product catching fire. Make sure that you avoid smoking and flames after applying CHG to your skin. Do not use CHG:  If you have a chlorhexidine allergy or have previously reacted to  chlorhexidine.  On babies younger than 50 months of age. How to use CHG solution  Use CHG only as told by your health care provider, and follow the instructions on the label.  Use the full amount of CHG as directed. Usually, this is one bottle. During a shower Follow these steps when using CHG solution during a shower (unless your health care provider gives you different instructions): 1. Start the shower. 2. Use your normal soap and shampoo to wash your face and hair. 3. Turn off the shower or move out of the shower stream. 4. Pour the CHG onto a clean washcloth. Do not use any type of brush or rough-edged sponge. 5. Starting at your neck, lather your body down to your toes. Make sure you follow these instructions: ? If you will be having surgery, pay special attention to the part of your body where you will be having surgery. Scrub this area for at least 1 minute. ? Do not use CHG on your head or face. If the solution gets into your ears or eyes, rinse them well with water. ? Avoid your genital area. ? Avoid any areas of skin that have broken skin, cuts, or scrapes. ? Scrub your back and under your arms. Make sure to wash skin folds. 6. Let the lather sit on your skin for 1-2 minutes or as long as told by your health care provider. 7. Thoroughly rinse your entire body in the shower. Make sure that all body creases and crevices are rinsed well. 8. Dry off with a clean towel. Do not put any substances on your body afterward--such as powder, lotion, or perfume--unless you are told to do so by your health care provider. Only use lotions  that are recommended by the manufacturer. 9. Put on clean clothes or pajamas. 10. If it is the night before your surgery, sleep in clean sheets.  During a sponge bath Follow these steps when using CHG solution during a sponge bath (unless your health care provider gives you different instructions): 1. Use your normal soap and shampoo to wash your face and hair. 2. Pour the CHG onto a clean washcloth. 3. Starting at your neck, lather your body down to your toes. Make sure you follow these instructions: ? If you will be having surgery, pay special attention to the part of your body where you will be having surgery. Scrub this area for at least 1 minute. ? Do not use CHG on your head or face. If the solution gets into your ears or eyes, rinse them well with water. ? Avoid your genital area. ? Avoid any areas of skin that have broken skin, cuts, or scrapes. ? Scrub your back and under your arms. Make sure to wash skin folds. 4. Let the lather sit on your skin for 1-2 minutes or as long as told by your health care provider. 5. Using a different clean, wet washcloth, thoroughly rinse your entire body. Make sure that all body creases and crevices are rinsed well. 6. Dry off with a clean towel. Do not put any substances on your body afterward--such as powder, lotion, or perfume--unless you are told to do so by your health care provider. Only use lotions that are recommended by the manufacturer. 7. Put on clean clothes or pajamas. 8. If it is the night before your surgery, sleep in clean sheets. How to use CHG prepackaged cloths  Only use CHG cloths as told by your health care provider, and follow the instructions  on the label.  Use the CHG cloth on clean, dry skin.  Do not use the CHG cloth on your head or face unless your health care provider tells you to.  When washing with the CHG cloth: ? Avoid your genital area. ? Avoid any areas of skin that have broken skin, cuts, or scrapes. Before  surgery Follow these steps when using a CHG cloth to clean before surgery (unless your health care provider gives you different instructions): 1. Using the CHG cloth, vigorously scrub the part of your body where you will be having surgery. Scrub using a back-and-forth motion for 3 minutes. The area on your body should be completely wet with CHG when you are done scrubbing. 2. Do not rinse. Discard the cloth and let the area air-dry. Do not put any substances on the area afterward, such as powder, lotion, or perfume. 3. Put on clean clothes or pajamas. 4. If it is the night before your surgery, sleep in clean sheets.  For general bathing Follow these steps when using CHG cloths for general bathing (unless your health care provider gives you different instructions). 1. Use a separate CHG cloth for each area of your body. Make sure you wash between any folds of skin and between your fingers and toes. Wash your body in the following order, switching to a new cloth after each step: ? The front of your neck, shoulders, and chest. ? Both of your arms, under your arms, and your hands. ? Your stomach and groin area, avoiding the genitals. ? Your right leg and foot. ? Your left leg and foot. ? The back of your neck, your back, and your buttocks. 2. Do not rinse. Discard the cloth and let the area air-dry. Do not put any substances on your body afterward--such as powder, lotion, or perfume--unless you are told to do so by your health care provider. Only use lotions that are recommended by the manufacturer. 3. Put on clean clothes or pajamas. Contact a health care provider if:  Your skin gets irritated after scrubbing.  You have questions about using your solution or cloth. Get help right away if:  Your eyes become very red or swollen.  Your eyes itch badly.  Your skin itches badly and is red or swollen.  Your hearing changes.  You have trouble seeing.  You have swelling or tingling in your  mouth or throat.  You have trouble breathing.  You swallow any chlorhexidine. Summary  Chlorhexidine gluconate (CHG) is a germ-killing (antiseptic) solution that is used to clean the skin. Cleaning your skin with CHG may help to lower your risk for infection.  You may be given CHG to use for bathing. It may be in a bottle or in a prepackaged cloth to use on your skin. Carefully follow your health care provider's instructions and the instructions on the product label.  Do not use CHG if you have a chlorhexidine allergy.  Contact your health care provider if your skin gets irritated after scrubbing. This information is not intended to replace advice given to you by your health care provider. Make sure you discuss any questions you have with your health care provider. Document Revised: 03/05/2019 Document Reviewed: 11/14/2017 Elsevier Patient Education  [place].

## 2020-08-25 ENCOUNTER — Other Ambulatory Visit: Payer: Self-pay

## 2020-08-25 ENCOUNTER — Encounter (HOSPITAL_COMMUNITY): Payer: Self-pay

## 2020-08-25 ENCOUNTER — Encounter (HOSPITAL_COMMUNITY)
Admission: RE | Admit: 2020-08-25 | Discharge: 2020-08-25 | Disposition: A | Discharge status: Home / Self Care | Payer: Medicare Other | Source: Ambulatory Visit | Attending: General Surgery | Admitting: General Surgery

## 2020-08-25 NOTE — Pre-Procedure Instructions (Signed)
After multiple attempts by multiple RN's and phlebotomist's, Dr Arnoldo Morale notified that we are unable to draw labs. He wants consent for insertion of central line that he will insert the morning of surgery. Patient is arriving at [facility] on 08/29/2020 for COVID and insertion of central line. Patient and daughter, [name] both verbalize understaning.

## 2020-08-26 ENCOUNTER — Ambulatory Visit: Payer: Medicare Other | Admitting: Gastroenterology

## 2020-08-29 DIAGNOSIS — J449 Chronic obstructive pulmonary disease, unspecified: Secondary | ICD-10-CM | POA: Diagnosis not present

## 2020-08-29 DIAGNOSIS — E785 Hyperlipidemia, unspecified: Secondary | ICD-10-CM | POA: Diagnosis not present

## 2020-08-29 DIAGNOSIS — E1022 Type 1 diabetes mellitus with diabetic chronic kidney disease: Secondary | ICD-10-CM | POA: Diagnosis not present

## 2020-08-29 DIAGNOSIS — D638 Anemia in other chronic diseases classified elsewhere: Secondary | ICD-10-CM | POA: Diagnosis not present

## 2020-08-30 ENCOUNTER — Encounter (HOSPITAL_COMMUNITY): Admission: RE | Disposition: E | Payer: Self-pay | Source: Skilled Nursing Facility | Attending: General Surgery

## 2020-08-30 ENCOUNTER — Inpatient Hospital Stay (HOSPITAL_COMMUNITY): Payer: Medicare Other | Admitting: Anesthesiology

## 2020-08-30 ENCOUNTER — Inpatient Hospital Stay (HOSPITAL_COMMUNITY): Payer: Medicare Other

## 2020-08-30 ENCOUNTER — Inpatient Hospital Stay (HOSPITAL_COMMUNITY)
Admission: RE | Admit: 2020-08-30 | Discharge: 2020-09-30 | DRG: 329 | Disposition: E | Payer: Medicare Other | Source: Skilled Nursing Facility | Attending: General Surgery | Admitting: General Surgery

## 2020-08-30 ENCOUNTER — Encounter (HOSPITAL_COMMUNITY): Payer: Self-pay | Admitting: General Surgery

## 2020-08-30 DIAGNOSIS — N170 Acute kidney failure with tubular necrosis: Secondary | ICD-10-CM | POA: Diagnosis not present

## 2020-08-30 DIAGNOSIS — G7000 Myasthenia gravis without (acute) exacerbation: Secondary | ICD-10-CM | POA: Diagnosis not present

## 2020-08-30 DIAGNOSIS — I5030 Unspecified diastolic (congestive) heart failure: Secondary | ICD-10-CM | POA: Diagnosis not present

## 2020-08-30 DIAGNOSIS — E785 Hyperlipidemia, unspecified: Secondary | ICD-10-CM | POA: Diagnosis not present

## 2020-08-30 DIAGNOSIS — Z90710 Acquired absence of both cervix and uterus: Secondary | ICD-10-CM

## 2020-08-30 DIAGNOSIS — Z789 Other specified health status: Secondary | ICD-10-CM | POA: Diagnosis not present

## 2020-08-30 DIAGNOSIS — J449 Chronic obstructive pulmonary disease, unspecified: Secondary | ICD-10-CM | POA: Diagnosis not present

## 2020-08-30 DIAGNOSIS — Z794 Long term (current) use of insulin: Secondary | ICD-10-CM

## 2020-08-30 DIAGNOSIS — E875 Hyperkalemia: Secondary | ICD-10-CM | POA: Diagnosis not present

## 2020-08-30 DIAGNOSIS — N179 Acute kidney failure, unspecified: Secondary | ICD-10-CM

## 2020-08-30 DIAGNOSIS — Z841 Family history of disorders of kidney and ureter: Secondary | ICD-10-CM

## 2020-08-30 DIAGNOSIS — E669 Obesity, unspecified: Secondary | ICD-10-CM | POA: Diagnosis present

## 2020-08-30 DIAGNOSIS — I700 Atherosclerosis of aorta: Secondary | ICD-10-CM | POA: Diagnosis not present

## 2020-08-30 DIAGNOSIS — N281 Cyst of kidney, acquired: Secondary | ICD-10-CM | POA: Diagnosis not present

## 2020-08-30 DIAGNOSIS — R571 Hypovolemic shock: Secondary | ICD-10-CM | POA: Diagnosis not present

## 2020-08-30 DIAGNOSIS — I129 Hypertensive chronic kidney disease with stage 1 through stage 4 chronic kidney disease, or unspecified chronic kidney disease: Secondary | ICD-10-CM | POA: Diagnosis present

## 2020-08-30 DIAGNOSIS — D72829 Elevated white blood cell count, unspecified: Secondary | ICD-10-CM | POA: Diagnosis not present

## 2020-08-30 DIAGNOSIS — E872 Acidosis: Secondary | ICD-10-CM | POA: Diagnosis present

## 2020-08-30 DIAGNOSIS — N1330 Unspecified hydronephrosis: Secondary | ICD-10-CM | POA: Diagnosis not present

## 2020-08-30 DIAGNOSIS — M47816 Spondylosis without myelopathy or radiculopathy, lumbar region: Secondary | ICD-10-CM | POA: Diagnosis present

## 2020-08-30 DIAGNOSIS — I517 Cardiomegaly: Secondary | ICD-10-CM | POA: Diagnosis not present

## 2020-08-30 DIAGNOSIS — R5383 Other fatigue: Secondary | ICD-10-CM | POA: Diagnosis not present

## 2020-08-30 DIAGNOSIS — G4733 Obstructive sleep apnea (adult) (pediatric): Secondary | ICD-10-CM | POA: Diagnosis present

## 2020-08-30 DIAGNOSIS — D62 Acute posthemorrhagic anemia: Secondary | ICD-10-CM | POA: Diagnosis not present

## 2020-08-30 DIAGNOSIS — K6389 Other specified diseases of intestine: Secondary | ICD-10-CM | POA: Diagnosis not present

## 2020-08-30 DIAGNOSIS — Z961 Presence of intraocular lens: Secondary | ICD-10-CM | POA: Diagnosis present

## 2020-08-30 DIAGNOSIS — M1990 Unspecified osteoarthritis, unspecified site: Secondary | ICD-10-CM | POA: Diagnosis present

## 2020-08-30 DIAGNOSIS — J690 Pneumonitis due to inhalation of food and vomit: Secondary | ICD-10-CM | POA: Diagnosis not present

## 2020-08-30 DIAGNOSIS — Z7952 Long term (current) use of systemic steroids: Secondary | ICD-10-CM

## 2020-08-30 DIAGNOSIS — J9811 Atelectasis: Secondary | ICD-10-CM | POA: Diagnosis not present

## 2020-08-30 DIAGNOSIS — K5180 Other ulcerative colitis without complications: Secondary | ICD-10-CM | POA: Diagnosis not present

## 2020-08-30 DIAGNOSIS — I340 Nonrheumatic mitral (valve) insufficiency: Secondary | ICD-10-CM | POA: Diagnosis present

## 2020-08-30 DIAGNOSIS — I469 Cardiac arrest, cause unspecified: Secondary | ICD-10-CM | POA: Diagnosis not present

## 2020-08-30 DIAGNOSIS — J90 Pleural effusion, not elsewhere classified: Secondary | ICD-10-CM | POA: Diagnosis not present

## 2020-08-30 DIAGNOSIS — R34 Anuria and oliguria: Secondary | ICD-10-CM | POA: Diagnosis not present

## 2020-08-30 DIAGNOSIS — F329 Major depressive disorder, single episode, unspecified: Secondary | ICD-10-CM | POA: Diagnosis present

## 2020-08-30 DIAGNOSIS — N739 Female pelvic inflammatory disease, unspecified: Secondary | ICD-10-CM

## 2020-08-30 DIAGNOSIS — Z20822 Contact with and (suspected) exposure to covid-19: Secondary | ICD-10-CM | POA: Diagnosis present

## 2020-08-30 DIAGNOSIS — Z6833 Body mass index (BMI) 33.0-33.9, adult: Secondary | ICD-10-CM | POA: Diagnosis not present

## 2020-08-30 DIAGNOSIS — Z4659 Encounter for fitting and adjustment of other gastrointestinal appliance and device: Secondary | ICD-10-CM

## 2020-08-30 DIAGNOSIS — D631 Anemia in chronic kidney disease: Secondary | ICD-10-CM | POA: Diagnosis present

## 2020-08-30 DIAGNOSIS — Z452 Encounter for adjustment and management of vascular access device: Secondary | ICD-10-CM | POA: Diagnosis not present

## 2020-08-30 DIAGNOSIS — N1831 Chronic kidney disease, stage 3a: Secondary | ICD-10-CM | POA: Diagnosis present

## 2020-08-30 DIAGNOSIS — N189 Chronic kidney disease, unspecified: Secondary | ICD-10-CM | POA: Diagnosis not present

## 2020-08-30 DIAGNOSIS — Z79899 Other long term (current) drug therapy: Secondary | ICD-10-CM

## 2020-08-30 DIAGNOSIS — Z8701 Personal history of pneumonia (recurrent): Secondary | ICD-10-CM | POA: Diagnosis not present

## 2020-08-30 DIAGNOSIS — K660 Peritoneal adhesions (postprocedural) (postinfection): Secondary | ICD-10-CM | POA: Diagnosis present

## 2020-08-30 DIAGNOSIS — R011 Cardiac murmur, unspecified: Secondary | ICD-10-CM | POA: Diagnosis present

## 2020-08-30 DIAGNOSIS — E8809 Other disorders of plasma-protein metabolism, not elsewhere classified: Secondary | ICD-10-CM | POA: Diagnosis present

## 2020-08-30 DIAGNOSIS — K529 Noninfective gastroenteritis and colitis, unspecified: Secondary | ICD-10-CM | POA: Diagnosis not present

## 2020-08-30 DIAGNOSIS — E1122 Type 2 diabetes mellitus with diabetic chronic kidney disease: Secondary | ICD-10-CM | POA: Diagnosis present

## 2020-08-30 DIAGNOSIS — K639 Disease of intestine, unspecified: Secondary | ICD-10-CM | POA: Diagnosis present

## 2020-08-30 DIAGNOSIS — K5730 Diverticulosis of large intestine without perforation or abscess without bleeding: Secondary | ICD-10-CM | POA: Diagnosis present

## 2020-08-30 DIAGNOSIS — I959 Hypotension, unspecified: Secondary | ICD-10-CM | POA: Diagnosis not present

## 2020-08-30 DIAGNOSIS — R0602 Shortness of breath: Secondary | ICD-10-CM

## 2020-08-30 DIAGNOSIS — Z87891 Personal history of nicotine dependence: Secondary | ICD-10-CM

## 2020-08-30 DIAGNOSIS — K5669 Other partial intestinal obstruction: Secondary | ICD-10-CM | POA: Diagnosis not present

## 2020-08-30 DIAGNOSIS — Z8249 Family history of ischemic heart disease and other diseases of the circulatory system: Secondary | ICD-10-CM

## 2020-08-30 DIAGNOSIS — Z833 Family history of diabetes mellitus: Secondary | ICD-10-CM

## 2020-08-30 DIAGNOSIS — Z96651 Presence of right artificial knee joint: Secondary | ICD-10-CM | POA: Diagnosis present

## 2020-08-30 DIAGNOSIS — I272 Pulmonary hypertension, unspecified: Secondary | ICD-10-CM | POA: Diagnosis present

## 2020-08-30 DIAGNOSIS — F419 Anxiety disorder, unspecified: Secondary | ICD-10-CM | POA: Diagnosis present

## 2020-08-30 DIAGNOSIS — Z9842 Cataract extraction status, left eye: Secondary | ICD-10-CM

## 2020-08-30 DIAGNOSIS — J302 Other seasonal allergic rhinitis: Secondary | ICD-10-CM | POA: Diagnosis present

## 2020-08-30 DIAGNOSIS — K219 Gastro-esophageal reflux disease without esophagitis: Secondary | ICD-10-CM | POA: Diagnosis present

## 2020-08-30 HISTORY — PX: PARTIAL COLECTOMY: SHX5273

## 2020-08-30 HISTORY — PX: CENTRAL LINE INSERTION: CATH118232

## 2020-08-30 LAB — HEMOGLOBIN AND HEMATOCRIT, BLOOD
HCT: 36.3 % (ref 36.0–46.0)
Hemoglobin: 11.2 g/dL — ABNORMAL LOW (ref 12.0–15.0)

## 2020-08-30 LAB — SARS CORONAVIRUS 2 BY RT PCR (HOSPITAL ORDER, PERFORMED IN ~~LOC~~ HOSPITAL LAB): SARS Coronavirus 2: NEGATIVE

## 2020-08-30 LAB — BASIC METABOLIC PANEL
Anion gap: 10 (ref 5–15)
BUN: 19 mg/dL (ref 8–23)
CO2: 22 mmol/L (ref 22–32)
Calcium: 8 mg/dL — ABNORMAL LOW (ref 8.9–10.3)
Chloride: 107 mmol/L (ref 98–111)
Creatinine, Ser: 1.15 mg/dL — ABNORMAL HIGH (ref 0.44–1.00)
GFR calc Af Amer: 53 mL/min — ABNORMAL LOW (ref >60)
GFR calc non Af Amer: 46 mL/min — ABNORMAL LOW (ref >60)
Glucose, Bld: 99 mg/dL (ref 70–99)
Potassium: 3.5 mmol/L (ref 3.5–5.1)
Sodium: 139 mmol/L (ref 135–145)

## 2020-08-30 LAB — CBC WITH DIFFERENTIAL/PLATELET
Abs Immature Granulocytes: 0.07 10*3/uL (ref 0.00–0.07)
Basophils Absolute: 0 10*3/uL (ref 0.0–0.1)
Basophils Relative: 0 %
Eosinophils Absolute: 0.2 10*3/uL (ref 0.0–0.5)
Eosinophils Relative: 1 %
HCT: 25.1 % — ABNORMAL LOW (ref 36.0–46.0)
Hemoglobin: 7.8 g/dL — ABNORMAL LOW (ref 12.0–15.0)
Immature Granulocytes: 1 %
Lymphocytes Relative: 15 %
Lymphs Abs: 1.8 10*3/uL (ref 0.7–4.0)
MCH: 28.1 pg (ref 26.0–34.0)
MCHC: 31.1 g/dL (ref 30.0–36.0)
MCV: 90.3 fL (ref 80.0–100.0)
Monocytes Absolute: 1.3 10*3/uL — ABNORMAL HIGH (ref 0.1–1.0)
Monocytes Relative: 11 %
Neutro Abs: 8.8 10*3/uL — ABNORMAL HIGH (ref 1.7–7.7)
Neutrophils Relative %: 72 %
Platelets: 273 10*3/uL (ref 150–400)
RBC: 2.78 MIL/uL — ABNORMAL LOW (ref 3.87–5.11)
RDW: 19.2 % — ABNORMAL HIGH (ref 11.5–15.5)
WBC: 12.2 10*3/uL — ABNORMAL HIGH (ref 4.0–10.5)
nRBC: 0.5 % — ABNORMAL HIGH (ref 0.0–0.2)

## 2020-08-30 LAB — HEMOGLOBIN A1C
Hgb A1c MFr Bld: 5.7 % — ABNORMAL HIGH (ref 4.8–5.6)
Mean Plasma Glucose: 116.89 mg/dL

## 2020-08-30 LAB — GLUCOSE, CAPILLARY
Glucose-Capillary: 90 mg/dL (ref 70–99)
Glucose-Capillary: 108 mg/dL — ABNORMAL HIGH (ref 70–99)
Glucose-Capillary: 121 mg/dL — ABNORMAL HIGH (ref 70–99)
Glucose-Capillary: 87 mg/dL (ref 70–99)

## 2020-08-30 LAB — MRSA PCR SCREENING: MRSA by PCR: NEGATIVE

## 2020-08-30 LAB — PREPARE RBC (CROSSMATCH)

## 2020-08-30 SURGERY — COLECTOMY, PARTIAL
Anesthesia: General | Site: Chest

## 2020-08-30 MED ORDER — HYDROMORPHONE HCL 1 MG/ML IJ SOLN: 0.2500 mg | INTRAMUSCULAR | Status: DC | PRN

## 2020-08-30 MED ORDER — DIPHENHYDRAMINE HCL 12.5 MG/5ML PO ELIX
12.5000 mg | ORAL_SOLUTION | Freq: Four times a day (QID) | ORAL | Status: DC | PRN
  Administered 2020-09-06: 12.5 mg via ORAL
  Filled 2020-08-30: qty 5

## 2020-08-30 MED ORDER — LIDOCAINE HCL (PF) 1 % IJ SOLN
INTRAMUSCULAR | Status: AC
  Filled 2020-08-30: qty 30

## 2020-08-30 MED ORDER — ALVIMOPAN 12 MG PO CAPS
ORAL_CAPSULE | ORAL | Status: AC
  Filled 2020-08-30: qty 1

## 2020-08-30 MED ORDER — ONDANSETRON HCL 4 MG/2ML IJ SOLN
INTRAMUSCULAR | Status: DC | PRN
  Administered 2020-08-30: 4 mg via INTRAVENOUS

## 2020-08-30 MED ORDER — BRIMONIDINE TARTRATE-TIMOLOL 0.2-0.5 % OP SOLN
1.0000 [drp] | Freq: Two times a day (BID) | OPHTHALMIC | Status: DC
  Filled 2020-08-30: qty 5

## 2020-08-30 MED ORDER — DIPHENHYDRAMINE HCL 50 MG/ML IJ SOLN
12.5000 mg | Freq: Four times a day (QID) | INTRAMUSCULAR | Status: DC | PRN
  Administered 2020-08-31 – 2020-09-01 (×3): 12.5 mg via INTRAVENOUS
  Filled 2020-08-30 (×3): qty 1

## 2020-08-30 MED ORDER — SIMETHICONE 80 MG PO CHEW: 40.0000 mg | CHEWABLE_TABLET | Freq: Four times a day (QID) | ORAL | Status: DC | PRN

## 2020-08-30 MED ORDER — SODIUM CHLORIDE 0.9 % IV SOLN: 10.0000 mL/h | Freq: Once | INTRAVENOUS | Status: DC

## 2020-08-30 MED ORDER — ACETAMINOPHEN 650 MG RE SUPP: 650.0000 mg | Freq: Four times a day (QID) | RECTAL | Status: DC | PRN

## 2020-08-30 MED ORDER — HEMOSTATIC AGENTS (NO CHARGE) OPTIME
TOPICAL | Status: DC | PRN
  Administered 2020-08-30: 1 via TOPICAL

## 2020-08-30 MED ORDER — CYCLOSPORINE 0.05 % OP EMUL
1.0000 [drp] | Freq: Two times a day (BID) | OPHTHALMIC | Status: DC
  Administered 2020-08-30 – 2020-09-07 (×18): 1 [drp] via OPHTHALMIC
  Filled 2020-08-30 (×17): qty 1

## 2020-08-30 MED ORDER — BUPIVACAINE LIPOSOME 1.3 % IJ SUSP
INTRAMUSCULAR | Status: DC | PRN
  Administered 2020-08-30: 20 mL

## 2020-08-30 MED ORDER — ESMOLOL HCL 100 MG/10ML IV SOLN
INTRAVENOUS | Status: AC
  Filled 2020-08-30: qty 10

## 2020-08-30 MED ORDER — TRAMADOL HCL 50 MG PO TABS
50.0000 mg | ORAL_TABLET | Freq: Four times a day (QID) | ORAL | Status: DC | PRN
  Administered 2020-08-31 – 2020-09-06 (×5): 50 mg via ORAL
  Filled 2020-08-30 (×5): qty 1

## 2020-08-30 MED ORDER — EZETIMIBE 10 MG PO TABS
10.0000 mg | ORAL_TABLET | Freq: Every day | ORAL | Status: DC
  Administered 2020-09-05 – 2020-09-07 (×3): 10 mg via ORAL
  Filled 2020-08-30 (×8): qty 1

## 2020-08-30 MED ORDER — ROSUVASTATIN CALCIUM 20 MG PO TABS
40.0000 mg | ORAL_TABLET | Freq: Every day | ORAL | Status: DC
  Administered 2020-08-31 – 2020-09-01 (×2): 40 mg via ORAL
  Filled 2020-08-30 (×4): qty 2

## 2020-08-30 MED ORDER — IPRATROPIUM-ALBUTEROL 0.5-2.5 (3) MG/3ML IN SOLN
3.0000 mL | Freq: Once | RESPIRATORY_TRACT | Status: AC
  Administered 2020-08-30: 3 mL via RESPIRATORY_TRACT
  Filled 2020-08-30: qty 3

## 2020-08-30 MED ORDER — ONDANSETRON HCL 4 MG/2ML IJ SOLN
INTRAMUSCULAR | Status: AC
  Filled 2020-08-30: qty 2

## 2020-08-30 MED ORDER — SODIUM CHLORIDE 0.9% FLUSH: 10.0000 mL | INTRAVENOUS | Status: DC | PRN

## 2020-08-30 MED ORDER — CHLORHEXIDINE GLUCONATE 0.12 % MT SOLN: 15.0000 mL | Freq: Once | OROMUCOSAL | Status: DC

## 2020-08-30 MED ORDER — LIDOCAINE HCL (PF) 1 % IJ SOLN
INTRAMUSCULAR | Status: AC
  Filled 2020-08-30: qty 2

## 2020-08-30 MED ORDER — FLUTICASONE PROPIONATE 50 MCG/ACT NA SUSP
2.0000 | Freq: Every day | NASAL | Status: DC
  Administered 2020-08-31 – 2020-09-07 (×6): 2 via NASAL
  Filled 2020-08-30 (×2): qty 16

## 2020-08-30 MED ORDER — CITALOPRAM HYDROBROMIDE 20 MG PO TABS
10.0000 mg | ORAL_TABLET | Freq: Every day | ORAL | Status: DC
  Administered 2020-09-01 – 2020-09-07 (×4): 10 mg via ORAL
  Filled 2020-08-30 (×8): qty 1

## 2020-08-30 MED ORDER — GABAPENTIN 300 MG PO CAPS
300.0000 mg | ORAL_CAPSULE | Freq: Two times a day (BID) | ORAL | Status: DC
  Administered 2020-08-31 – 2020-09-01 (×3): 300 mg via ORAL
  Filled 2020-08-30 (×4): qty 1

## 2020-08-30 MED ORDER — ALBUTEROL SULFATE (2.5 MG/3ML) 0.083% IN NEBU: 3.0000 mL | INHALATION_SOLUTION | Freq: Four times a day (QID) | RESPIRATORY_TRACT | Status: DC | PRN

## 2020-08-30 MED ORDER — SODIUM CHLORIDE 0.9 % IV SOLN: INTRAVENOUS | Status: DC | PRN

## 2020-08-30 MED ORDER — ALVIMOPAN 12 MG PO CAPS
12.0000 mg | ORAL_CAPSULE | Freq: Two times a day (BID) | ORAL | Status: DC
  Administered 2020-08-31 – 2020-09-01 (×4): 12 mg via ORAL
  Filled 2020-08-30 (×4): qty 1

## 2020-08-30 MED ORDER — INSULIN ASPART 100 UNIT/ML ~~LOC~~ SOLN
0.0000 [IU] | Freq: Three times a day (TID) | SUBCUTANEOUS | Status: DC
  Administered 2020-09-01: 4 [IU] via SUBCUTANEOUS
  Administered 2020-09-01: 7 [IU] via SUBCUTANEOUS
  Administered 2020-09-01: 4 [IU] via SUBCUTANEOUS
  Administered 2020-09-02: 15 [IU] via SUBCUTANEOUS
  Administered 2020-09-02 (×2): 11 [IU] via SUBCUTANEOUS
  Administered 2020-09-04 (×2): 3 [IU] via SUBCUTANEOUS
  Administered 2020-09-05 – 2020-09-06 (×3): 4 [IU] via SUBCUTANEOUS
  Administered 2020-09-06: 3 [IU] via SUBCUTANEOUS
  Administered 2020-09-06: 4 [IU] via SUBCUTANEOUS
  Filled 2020-08-30: qty 0.2

## 2020-08-30 MED ORDER — ONDANSETRON HCL 4 MG/2ML IJ SOLN
4.0000 mg | Freq: Four times a day (QID) | INTRAMUSCULAR | Status: DC | PRN
  Administered 2020-09-01: 4 mg via INTRAVENOUS
  Filled 2020-08-30: qty 2

## 2020-08-30 MED ORDER — ENOXAPARIN SODIUM 40 MG/0.4ML ~~LOC~~ SOLN
SUBCUTANEOUS | Status: AC
  Filled 2020-08-30: qty 0.4

## 2020-08-30 MED ORDER — FENTANYL CITRATE (PF) 250 MCG/5ML IJ SOLN
INTRAMUSCULAR | Status: AC
  Filled 2020-08-30: qty 5

## 2020-08-30 MED ORDER — POVIDONE-IODINE 10 % OINT PACKET
TOPICAL_OINTMENT | CUTANEOUS | Status: DC | PRN
  Administered 2020-08-30: 2 via TOPICAL

## 2020-08-30 MED ORDER — ORAL CARE MOUTH RINSE: 15.0000 mL | Freq: Once | OROMUCOSAL | Status: DC

## 2020-08-30 MED ORDER — ENOXAPARIN SODIUM 40 MG/0.4ML ~~LOC~~ SOLN
40.0000 mg | SUBCUTANEOUS | Status: DC
  Administered 2020-08-31 – 2020-09-01 (×2): 40 mg via SUBCUTANEOUS
  Filled 2020-08-30 (×2): qty 0.4

## 2020-08-30 MED ORDER — CHLORHEXIDINE GLUCONATE CLOTH 2 % EX PADS
6.0000 | MEDICATED_PAD | Freq: Every day | CUTANEOUS | Status: DC
  Administered 2020-08-31 – 2020-09-07 (×8): 6 via TOPICAL

## 2020-08-30 MED ORDER — SODIUM CHLORIDE 0.9 % IV SOLN: INTRAVENOUS | Status: DC

## 2020-08-30 MED ORDER — EPHEDRINE 5 MG/ML INJ
INTRAVENOUS | Status: AC
  Filled 2020-08-30: qty 10

## 2020-08-30 MED ORDER — CHLORHEXIDINE GLUCONATE CLOTH 2 % EX PADS: 6.0000 | MEDICATED_PAD | Freq: Once | CUTANEOUS | Status: DC

## 2020-08-30 MED ORDER — ONDANSETRON HCL 4 MG/2ML IJ SOLN: 4.0000 mg | Freq: Once | INTRAMUSCULAR | Status: DC | PRN

## 2020-08-30 MED ORDER — SODIUM CHLORIDE 0.9% FLUSH
10.0000 mL | Freq: Two times a day (BID) | INTRAVENOUS | Status: DC
  Administered 2020-08-30 – 2020-09-07 (×18): 10 mL

## 2020-08-30 MED ORDER — SUCCINYLCHOLINE 20MG/ML (10ML) SYRINGE FOR MEDFUSION PUMP - OPTIME
INTRAMUSCULAR | Status: DC | PRN
  Administered 2020-08-30: 10 mg via INTRAVENOUS

## 2020-08-30 MED ORDER — ENOXAPARIN SODIUM 40 MG/0.4ML ~~LOC~~ SOLN
40.0000 mg | Freq: Once | SUBCUTANEOUS | Status: AC
  Administered 2020-08-30: 40 mg via SUBCUTANEOUS

## 2020-08-30 MED ORDER — POVIDONE-IODINE 10 % EX OINT
TOPICAL_OINTMENT | CUTANEOUS | Status: AC
  Filled 2020-08-30: qty 2

## 2020-08-30 MED ORDER — LACTATED RINGERS IV SOLN: INTRAVENOUS | Status: DC | PRN

## 2020-08-30 MED ORDER — FENTANYL CITRATE (PF) 100 MCG/2ML IJ SOLN
INTRAMUSCULAR | Status: DC | PRN
Start: 2020-08-30 — End: 2020-08-30
  Administered 2020-08-30 (×4): 50 ug via INTRAVENOUS

## 2020-08-30 MED ORDER — SODIUM CHLORIDE 0.9 % IV SOLN
INTRAVENOUS | Status: AC
  Filled 2020-08-30: qty 1

## 2020-08-30 MED ORDER — SUGAMMADEX SODIUM 200 MG/2ML IV SOLN
INTRAVENOUS | Status: DC | PRN
  Administered 2020-08-30: 300 mg via INTRAVENOUS

## 2020-08-30 MED ORDER — LORAZEPAM 2 MG/ML IJ SOLN: 0.5000 mg | INTRAMUSCULAR | Status: DC | PRN

## 2020-08-30 MED ORDER — PROPOFOL 10 MG/ML IV BOLUS
INTRAVENOUS | Status: AC
  Filled 2020-08-30: qty 20

## 2020-08-30 MED ORDER — FENTANYL CITRATE (PF) 100 MCG/2ML IJ SOLN
12.5000 ug | INTRAMUSCULAR | Status: DC | PRN
  Administered 2020-08-30 – 2020-09-06 (×16): 12.5 ug via INTRAVENOUS
  Filled 2020-08-30 (×16): qty 2

## 2020-08-30 MED ORDER — SODIUM CHLORIDE 0.9 % IV SOLN: Freq: Once | INTRAVENOUS | Status: DC

## 2020-08-30 MED ORDER — HYDROMORPHONE HCL 1 MG/ML IJ SOLN
INTRAMUSCULAR | Status: AC
  Filled 2020-08-30: qty 0.5

## 2020-08-30 MED ORDER — BUPIVACAINE LIPOSOME 1.3 % IJ SUSP
INTRAMUSCULAR | Status: AC
  Filled 2020-08-30: qty 10

## 2020-08-30 MED ORDER — LIDOCAINE HCL (CARDIAC) PF 50 MG/5ML IV SOSY
PREFILLED_SYRINGE | INTRAVENOUS | Status: DC | PRN
  Administered 2020-08-30: 80 mg via INTRAVENOUS

## 2020-08-30 MED ORDER — NOREPINEPHRINE 4 MG/250ML-% IV SOLN: 0.0000 ug/min | INTRAVENOUS | Status: DC

## 2020-08-30 MED ORDER — SODIUM CHLORIDE 0.9 % IV SOLN: Freq: Once | INTRAVENOUS | Status: AC

## 2020-08-30 MED ORDER — ROCURONIUM 10MG/ML (10ML) SYRINGE FOR MEDFUSION PUMP - OPTIME
INTRAVENOUS | Status: DC | PRN
  Administered 2020-08-30: 20 mg via INTRAVENOUS
  Administered 2020-08-30: 50 mg via INTRAVENOUS

## 2020-08-30 MED ORDER — LACTATED RINGERS IV SOLN: Freq: Once | INTRAVENOUS | Status: AC

## 2020-08-30 MED ORDER — VENLAFAXINE HCL ER 75 MG PO CP24
150.0000 mg | ORAL_CAPSULE | Freq: Every day | ORAL | Status: DC
  Administered 2020-08-31 – 2020-09-07 (×5): 150 mg via ORAL
  Filled 2020-08-30 (×3): qty 2
  Filled 2020-08-30 (×2): qty 1
  Filled 2020-08-30 (×3): qty 2

## 2020-08-30 MED ORDER — ONDANSETRON 4 MG PO TBDP: 4.0000 mg | ORAL_TABLET | Freq: Four times a day (QID) | ORAL | Status: DC | PRN

## 2020-08-30 MED ORDER — ALVIMOPAN 12 MG PO CAPS
12.0000 mg | ORAL_CAPSULE | ORAL | Status: AC
  Administered 2020-08-30: 12 mg via ORAL

## 2020-08-30 MED ORDER — BRIMONIDINE TARTRATE 0.2 % OP SOLN
1.0000 [drp] | Freq: Two times a day (BID) | OPHTHALMIC | Status: DC
  Administered 2020-08-30 – 2020-09-07 (×18): 1 [drp] via OPHTHALMIC
  Filled 2020-08-30: qty 5

## 2020-08-30 MED ORDER — SODIUM CHLORIDE 0.9 % IV SOLN
1.0000 g | INTRAVENOUS | Status: AC
  Administered 2020-08-30: 1 g via INTRAVENOUS

## 2020-08-30 MED ORDER — EPHEDRINE SULFATE 50 MG/ML IJ SOLN
INTRAMUSCULAR | Status: DC | PRN
  Administered 2020-08-30: 10 mg via INTRAVENOUS
  Administered 2020-08-30: 5 mg via INTRAVENOUS

## 2020-08-30 MED ORDER — SUGAMMADEX SODIUM 500 MG/5ML IV SOLN
INTRAVENOUS | Status: AC
  Filled 2020-08-30: qty 10

## 2020-08-30 MED ORDER — NOREPINEPHRINE 4 MG/250ML-% IV SOLN
INTRAVENOUS | Status: AC
  Administered 2020-08-30: 5 ug/min via INTRAVENOUS
  Filled 2020-08-30: qty 250

## 2020-08-30 MED ORDER — TIMOLOL MALEATE 0.5 % OP SOLN
1.0000 [drp] | Freq: Two times a day (BID) | OPHTHALMIC | Status: DC
  Administered 2020-08-30 – 2020-09-07 (×18): 1 [drp] via OPHTHALMIC
  Filled 2020-08-30: qty 5

## 2020-08-30 MED ORDER — ADULT MULTIVITAMIN W/MINERALS CH
1.0000 | ORAL_TABLET | Freq: Every day | ORAL | Status: DC
  Administered 2020-08-31 – 2020-09-06 (×4): 1 via ORAL
  Filled 2020-08-30 (×6): qty 1

## 2020-08-30 MED ORDER — PROPOFOL 10 MG/ML IV BOLUS
INTRAVENOUS | Status: DC | PRN
  Administered 2020-08-30: 160 mg via INTRAVENOUS

## 2020-08-30 MED ORDER — MIDAZOLAM HCL 2 MG/2ML IJ SOLN
INTRAMUSCULAR | Status: AC
  Filled 2020-08-30: qty 2

## 2020-08-30 MED ORDER — METHYLPREDNISOLONE SODIUM SUCC 125 MG IJ SOLR
80.0000 mg | Freq: Two times a day (BID) | INTRAMUSCULAR | Status: DC
  Administered 2020-08-30 – 2020-08-31 (×2): 80 mg via INTRAVENOUS
  Filled 2020-08-30 (×2): qty 2

## 2020-08-30 MED ORDER — PREDNISONE 10 MG PO TABS
5.0000 mg | ORAL_TABLET | Freq: Every day | ORAL | Status: DC
  Filled 2020-08-30 (×2): qty 1

## 2020-08-30 MED ORDER — ACETAMINOPHEN 325 MG PO TABS
650.0000 mg | ORAL_TABLET | Freq: Four times a day (QID) | ORAL | Status: DC | PRN
  Administered 2020-09-06: 650 mg via ORAL
  Filled 2020-08-30: qty 2

## 2020-08-30 MED ORDER — 0.9 % SODIUM CHLORIDE (POUR BTL) OPTIME
TOPICAL | Status: DC | PRN
  Administered 2020-08-30: 4000 mL

## 2020-08-30 MED ORDER — HYDROMORPHONE HCL 1 MG/ML IJ SOLN
0.5000 mg | INTRAMUSCULAR | Status: AC
  Administered 2020-08-30: 0.5 mg via INTRAVENOUS

## 2020-08-30 MED ORDER — DILTIAZEM HCL ER COATED BEADS 120 MG PO CP24
120.0000 mg | ORAL_CAPSULE | Freq: Every day | ORAL | Status: DC
  Administered 2020-09-01 – 2020-09-07 (×4): 120 mg via ORAL
  Filled 2020-08-30 (×8): qty 1

## 2020-08-30 SURGICAL SUPPLY — 45 items
COVER LIGHT HANDLE STERIS (MISCELLANEOUS) ×16 IMPLANT
COVER WAND RF STERILE (DRAPES) ×4 IMPLANT
DRSG OPSITE POSTOP 4X10 (GAUZE/BANDAGES/DRESSINGS) ×2 IMPLANT
ELECT REM PT RETURN 9FT ADLT (ELECTROSURGICAL) ×4
ELECTRODE REM PT RTRN 9FT ADLT (ELECTROSURGICAL) ×2 IMPLANT
GLOVE BIOGEL M 7.0 STRL (GLOVE) ×8 IMPLANT
GLOVE BIOGEL M STRL SZ7.5 (GLOVE) ×6 IMPLANT
GLOVE BIOGEL PI IND STRL 7.0 (GLOVE) ×10 IMPLANT
GLOVE BIOGEL PI IND STRL 7.5 (GLOVE) IMPLANT
GLOVE BIOGEL PI IND STRL 8 (GLOVE) IMPLANT
GLOVE BIOGEL PI INDICATOR 7.0 (GLOVE) ×18
GLOVE BIOGEL PI INDICATOR 7.5 (GLOVE) ×12
GLOVE BIOGEL PI INDICATOR 8 (GLOVE) ×2
GLOVE SS BIOGEL STRL SZ 7 (GLOVE) IMPLANT
GLOVE SS BIOGEL STRL SZ 8 (GLOVE) IMPLANT
GLOVE SUPERSENSE BIOGEL SZ 7 (GLOVE) ×6
GLOVE SUPERSENSE BIOGEL SZ 8 (GLOVE) ×2
GLOVE SURG SS PI 7.5 STRL IVOR (GLOVE) ×8 IMPLANT
GOWN STRL REUS W/TWL LRG LVL3 (GOWN DISPOSABLE) ×24 IMPLANT
HEMOSTAT SURGICEL 4X8 (HEMOSTASIS) ×2 IMPLANT
INST SET MAJOR GENERAL (KITS) ×4 IMPLANT
KIT BLADEGUARD II DBL (SET/KITS/TRAYS/PACK) ×2 IMPLANT
KIT TURNOVER KIT A (KITS) ×4 IMPLANT
LIGASURE IMPACT 36 18CM CVD LR (INSTRUMENTS) ×4 IMPLANT
MANIFOLD NEPTUNE II (INSTRUMENTS) ×4 IMPLANT
NDL HYPO 18GX1.5 BLUNT FILL (NEEDLE) ×2 IMPLANT
NEEDLE HYPO 18GX1.5 BLUNT FILL (NEEDLE) ×4 IMPLANT
NS IRRIG 1000ML POUR BTL (IV SOLUTION) ×12 IMPLANT
PACK COLON (CUSTOM PROCEDURE TRAY) ×4 IMPLANT
PAD ARMBOARD 7.5X6 YLW CONV (MISCELLANEOUS) ×4 IMPLANT
PENCIL SMOKE EVACUATOR COATED (MISCELLANEOUS) ×4 IMPLANT
RELOAD PROXIMATE 75MM BLUE (ENDOMECHANICALS) ×12 IMPLANT
RELOAD STAPLE 75 3.8 BLU REG (ENDOMECHANICALS) IMPLANT
RETRACTOR WND ALEXIS 25 LRG (MISCELLANEOUS) IMPLANT
RTRCTR WOUND ALEXIS 25CM LRG (MISCELLANEOUS) ×4
SPONGE LAP 18X18 RF (DISPOSABLE) ×16 IMPLANT
SPONGE SURGIFOAM ABS GEL 100 (HEMOSTASIS) ×2 IMPLANT
STAPLER CUT CVD 40MM GREEN (STAPLE) ×2 IMPLANT
STAPLER GUN LINEAR PROX 60 (STAPLE) ×4 IMPLANT
STAPLER PROXIMATE 75MM BLUE (STAPLE) ×2 IMPLANT
STAPLER VISISTAT (STAPLE) ×4 IMPLANT
SUT PDS AB 0 CTX 60 (SUTURE) ×4 IMPLANT
SUT SILK 3 0 SH CR/8 (SUTURE) ×6 IMPLANT
TRAY FOLEY MTR SLVR 16FR STAT (SET/KITS/TRAYS/PACK) ×4 IMPLANT
YANKAUER SUCT BULB TIP 10FT TU (MISCELLANEOUS) ×4 IMPLANT

## 2020-08-30 NOTE — Anesthesia Postprocedure Evaluation (Signed)
Anesthesia Post Note  Patient: [name]  Procedure(s) Performed: Left Hemicolectomy (N/A Abdomen) CENTRAL LINE INSERTION (Left Chest)  Patient location during evaluation: PACU Anesthesia Type: General Level of consciousness: awake and alert and oriented Pain management: pain level controlled Vital Signs Assessment: post-procedure vital signs reviewed and stable Respiratory status: spontaneous breathing Cardiovascular status: blood pressure returned to baseline and stable Postop Assessment: no apparent nausea or vomiting Anesthetic complications: no   No complications documented.   Last Vitals:  Vitals:   08/25/2020 0947 08/21/2020 1300  BP: (!) 145/75 96/63  Pulse: 80   Resp: 18 16  Temp: 36.8 C (!) 36.3 C  SpO2: 95% 100%    Last Pain:  Vitals:   08/29/2020 1300  TempSrc:   PainSc: Asleep                 Eesa Justiss

## 2020-08-30 NOTE — Anesthesia Procedure Notes (Signed)
Procedure Name: Intubation Date/Time: 08/15/2020 10:00 AM Performed by: Ollen Bowl, CRNA Pre-anesthesia Checklist: Patient identified, Patient being monitored, Timeout performed, Emergency Drugs available and Suction available Patient Re-evaluated:Patient Re-evaluated prior to induction Oxygen Delivery Method: Circle system utilized Preoxygenation: Pre-oxygenation with 100% oxygen Induction Type: IV induction Ventilation: Mask ventilation without difficulty Laryngoscope Size: Mac and 3 Grade View: Grade I Tube type: Oral Tube size: 7.0 mm Number of attempts: 1 Airway Equipment and Method: Stylet Placement Confirmation: ETT inserted through vocal cords under direct vision,  positive ETCO2 and breath sounds checked- equal and bilateral Secured at: 21 cm Tube secured with: Tape Dental Injury: Teeth and Oropharynx as per pre-operative assessment

## 2020-08-30 NOTE — Transfer of Care (Signed)
Immediate Anesthesia Transfer of Care Note  Patient: [name]  Procedure(s) Performed: Left Hemicolectomy (N/A Abdomen) CENTRAL LINE INSERTION (Left Chest)  Patient Location: PACU  Anesthesia Type:General  Level of Consciousness: sedated  Airway & Oxygen Therapy: Patient Spontanous Breathing and Patient connected to face mask oxygen  Post-op Assessment: Report given to RN  Post vital signs: Reviewed and stable  Last Vitals:  Vitals Value Taken Time  BP 96/63 08/11/2020 1300  Temp    Pulse 68 08/10/2020 1304  Resp 16 08/07/2020 1306  SpO2 100 % 08/04/2020 1304  Vitals shown include unvalidated device data.  Last Pain:  Vitals:   08/22/2020 0947  TempSrc: Oral  PainSc:          Complications: No complications documented.

## 2020-08-30 NOTE — Anesthesia Preprocedure Evaluation (Addendum)
Anesthesia Evaluation  Patient identified by MRN, date of birth, ID band Patient awake    Reviewed: Allergy & Precautions, NPO status , Patient's Chart, lab work & pertinent test results  History of Anesthesia Complications (+) history of anesthetic complications (combative)  Airway Mallampati: II  TM Distance: >3 FB Neck ROM: Full    Dental  (+) Edentulous Upper, Missing, Dental Advisory Given   Pulmonary shortness of breath and with exertion, sleep apnea and Continuous Positive Airway Pressure Ventilation , pneumonia, resolved, COPD, former smoker,    Pulmonary exam normal breath sounds clear to auscultation       Cardiovascular Exercise Tolerance: Poor hypertension, Pt. on medications Normal cardiovascular exam+ Valvular Problems/Murmurs MR  Rhythm:Regular Rate:Normal  1. Left ventricular ejection fraction, by estimation, is 70 to 75%. The left ventricle has hyperdynamic function. The left ventricle has no regional wall motion abnormalities. There is moderate left ventricular  hypertrophy. Left ventricular diastolic  parameters are consistent with Grade I diastolic dysfunction (impaired  relaxation). Elevated left atrial pressure.  2. Right ventricular systolic function is normal. The right ventricular  size is normal.  3. Left atrial size was moderately dilated.  4. The mitral valve is normal in structure. Trivial mitral valve regurgitation. No evidence of mitral stenosis.  5. The aortic valve is tricuspid. Aortic valve regurgitation is not visualized. No aortic stenosis is present.  6. The inferior vena cava is normal in size with greater than 50% respiratory variability, suggesting right atrial pressure of 3 mmHg   Neuro/Psych  Headaches, PSYCHIATRIC DISORDERS Anxiety Depression  Neuromuscular disease    GI/Hepatic PUD, GERD  Medicated,  Endo/Other  diabetes, Well Controlled, Type 2Hypothyroidism   Renal/GU Renal  InsufficiencyRenal disease     Musculoskeletal  (+) Arthritis , Myasthenia gravis in remission   Abdominal   Peds  Hematology  (+) anemia ,   Anesthesia Other Findings Colon cancer  Reproductive/Obstetrics                             Anesthesia Physical  Anesthesia Plan  ASA: IV  Anesthesia Plan: General   Post-op Pain Management:    Induction: Intravenous  PONV Risk Score and Plan: Ondansetron  Airway Management Planned: Oral ETT  Additional Equipment:   Intra-op Plan:   Post-operative Plan: Extubation in OR and Possible Post-op intubation/ventilation  Informed Consent: I have reviewed the patients History and Physical, chart, labs and discussed the procedure including the risks, benefits and alternatives for the proposed anesthesia with the patient or authorized representative who has indicated his/her understanding and acceptance.     Dental advisory given  Plan Discussed with: CRNA and Surgeon  Anesthesia Plan Comments:       Anesthesia Quick Evaluation

## 2020-08-30 NOTE — Interval H&P Note (Signed)
History and Physical Interval Note:  08/27/2020 8:15 AM  [name]  has presented today for surgery, with the diagnosis of Colon Mass.  The various methods of treatment have been discussed with the patient and family. After consideration of risks, benefits and other options for treatment, the patient has consented to  Procedure(s) with comments: PARTIAL COLECTOMY (N/A) - pt needs to arrive at 6:15, needs RAPID covid test, pt is in [facility] as a surgical intervention.  The patient's history has been reviewed, patient examined, no change in status, stable for surgery.  I have reviewed the patient's chart and labs.  Questions were answered to the patient's satisfaction.     Aviva Signs

## 2020-08-30 NOTE — Op Note (Signed)
Patient:  [name]  DOB:  October 02, 1941  MRN:  [id number]   Preop Diagnosis: Near obstructing descending colon mass  Postop Diagnosis: Same  Procedure: Left hemicolectomy, splenic flexure takedown, central line placement  Surgeon: Aviva Signs, MD  Anes: General endotracheal  Indications: Patient is a 79 year old black female who was found on recent colonoscopy to have a near obstructing colon mass in the proximal sigmoid colon.  Initial biopsies were unremarkable, but it is near obstructing and a pediatric scope could not be placed through it.  Patient now comes for a partial colectomy with possible colostomy.  The risks and benefits of the procedure including bleeding, infection, cardiopulmonary difficulties, the possibility of a blood transfusion, and the possibility of a colostomy were fully explained to the patient, who gave informed consent.  Procedure note: The patient was placed in supine position.  After induction of general endotracheal anesthesia, a left subclavian triple-lumen catheter was placed using the Seldinger technique without difficulty.  Good backflow of venous blood was noted on aspiration of the ports.  All 3 ports were flushed with saline.  A chest x-ray was performed in the operating room which showed adequate position and no pneumothorax.  The abdomen and perineum were then prepped and draped using the usual sterile technique with ChloraPrep.  Surgical site confirmation was performed.  Midline incision was made above the umbilicus to below the umbilicus.  The peritoneal cavity was entered into without difficulty.  The left lobe of the liver was palpated and no palpable lesions were noted.  I was unable to approach the right lobe of the liver due to adhesions and previous surgery.  I initially start mobilizing the colon along the peritoneal reflection.  The proximal to mid sigmoid colon was markedly thickened with a sclerotic reaction in the mesentery.  I bluntly had  to freed away the colon from the peritoneal reflection.  I did carried this dissection up to the splenic flexure.  There was also carried down into the pelvis.  The thickened colon was noted most prominently in the proximal sigmoid colon, though there were portions of the descending colon which had thickened bowel wall and adhesions present.  I elected to proceed with a left hemicolectomy.  A GIA stapler was placed across the distal transverse colon.  A contour stapler was placed around the distal sigmoid colon.  The splenic flexure was taken down using the LigaSure.  Care was taken to avoid the spleen.  The mesentery of the left colon was then divided using the LigaSure.  Care was taken to avoid the left ureter.  A suture was placed distally on the specimen and the specimen was removed.  A side to side transverse colon to distal sigmoid colon anastomosis was then performed using a GIA-75 stapler.  The colotomy was closed using a GIA-75 stapler.  Staple line was bolstered using 3-0 silk Lembert sutures.  The bowel was returned into the abdominal cavity in an orderly fashion.  The abdomen was then copiously irrigated with normal saline.  She was noted to have some bruising from the sigmoid colon mesentery.  Gelfoam and Surgicel were placed in this region.  I also placed Surgicel and Gelfoam in the left upper quadrant.  The bowel was then returned into the abdominal cavity in an orderly fashion.  All operating personnel then changed her gown and gloves.  A new set up was used for closure.  The fascia was reapproximated using a looped 0 PDS running suture.  The  subcutaneous layer was irrigated with normal saline.  Exparel was instilled into the surrounding wound.  The skin was closed using staples.  Betadine ointment and a dry sterile dressing were applied.  All tape and needle counts were correct at the end of the procedure.  The patient was extubated in the operating room and transferred to PACU in stable  condition.  Complications: None  EBL: 500 cc  Specimen: Left colon, suture distal  Transfusions: 2 units of packed red blood cells

## 2020-08-31 ENCOUNTER — Other Ambulatory Visit: Payer: Self-pay | Admitting: Family Medicine

## 2020-08-31 LAB — CBC
HCT: 31.6 % — ABNORMAL LOW (ref 36.0–46.0)
Hemoglobin: 9.9 g/dL — ABNORMAL LOW (ref 12.0–15.0)
MCH: 28.9 pg (ref 26.0–34.0)
MCHC: 31.3 g/dL (ref 30.0–36.0)
MCV: 92.1 fL (ref 80.0–100.0)
Platelets: 220 10*3/uL (ref 150–400)
RBC: 3.43 MIL/uL — ABNORMAL LOW (ref 3.87–5.11)
RDW: 18.6 % — ABNORMAL HIGH (ref 11.5–15.5)
WBC: 37.4 10*3/uL — ABNORMAL HIGH (ref 4.0–10.5)
nRBC: 0 % (ref 0.0–0.2)

## 2020-08-31 LAB — TYPE AND SCREEN
ABO/RH(D): B POS
Antibody Screen: NEGATIVE
Unit division: 0
Unit division: 0

## 2020-08-31 LAB — BASIC METABOLIC PANEL
Anion gap: 10 (ref 5–15)
BUN: 18 mg/dL (ref 8–23)
CO2: 19 mmol/L — ABNORMAL LOW (ref 22–32)
Calcium: 6.9 mg/dL — ABNORMAL LOW (ref 8.9–10.3)
Chloride: 113 mmol/L — ABNORMAL HIGH (ref 98–111)
Creatinine, Ser: 1.76 mg/dL — ABNORMAL HIGH (ref 0.44–1.00)
GFR calc Af Amer: 32 mL/min — ABNORMAL LOW (ref >60)
GFR calc non Af Amer: 27 mL/min — ABNORMAL LOW (ref >60)
Glucose, Bld: 100 mg/dL — ABNORMAL HIGH (ref 70–99)
Potassium: 3.9 mmol/L (ref 3.5–5.1)
Sodium: 142 mmol/L (ref 135–145)

## 2020-08-31 LAB — CEA: CEA: 54.6 ng/mL — ABNORMAL HIGH (ref 0.0–4.7)

## 2020-08-31 LAB — GLUCOSE, CAPILLARY
Glucose-Capillary: 87 mg/dL (ref 70–99)
Glucose-Capillary: 95 mg/dL (ref 70–99)
Glucose-Capillary: 112 mg/dL — ABNORMAL HIGH (ref 70–99)
Glucose-Capillary: 131 mg/dL — ABNORMAL HIGH (ref 70–99)

## 2020-08-31 LAB — BPAM RBC
Blood Product Expiration Date: 202109282359
Blood Product Expiration Date: 202110032359
ISSUE DATE / TIME: 202108311118
ISSUE DATE / TIME: 202108311143
Unit Type and Rh: 1700
Unit Type and Rh: 5100

## 2020-08-31 LAB — MAGNESIUM: Magnesium: 1.4 mg/dL — ABNORMAL LOW (ref 1.7–2.4)

## 2020-08-31 LAB — PHOSPHORUS: Phosphorus: 4.9 mg/dL — ABNORMAL HIGH (ref 2.5–4.6)

## 2020-08-31 MED ORDER — MAGNESIUM SULFATE 4 GM/100ML IV SOLN
4.0000 g | Freq: Once | INTRAVENOUS | Status: AC
  Administered 2020-08-31: 4 g via INTRAVENOUS
  Filled 2020-08-31: qty 100

## 2020-08-31 MED ORDER — LACTATED RINGERS IV BOLUS
1000.0000 mL | Freq: Once | INTRAVENOUS | Status: AC
  Administered 2020-08-31: 1000 mL via INTRAVENOUS

## 2020-08-31 MED ORDER — FUROSEMIDE 10 MG/ML IJ SOLN
INTRAMUSCULAR | Status: AC
  Administered 2020-08-31: 20 mg
  Filled 2020-08-31: qty 2

## 2020-08-31 MED ORDER — LACTATED RINGERS IV SOLN: INTRAVENOUS | Status: DC

## 2020-08-31 MED ORDER — FUROSEMIDE 10 MG/ML IJ SOLN
20.0000 mg | Freq: Once | INTRAMUSCULAR | Status: AC
  Administered 2020-08-31: 20 mg via INTRAVENOUS

## 2020-08-31 MED ORDER — PANTOPRAZOLE SODIUM 40 MG PO TBEC
40.0000 mg | DELAYED_RELEASE_TABLET | Freq: Every day | ORAL | Status: DC
  Administered 2020-08-31: 40 mg via ORAL
  Filled 2020-08-31: qty 1

## 2020-08-31 MED ORDER — FUROSEMIDE 10 MG/ML IJ SOLN
20.0000 mg | Freq: Once | INTRAMUSCULAR | Status: AC
  Administered 2020-08-31: 20 mg via INTRAVENOUS
  Filled 2020-08-31: qty 2

## 2020-08-31 MED ORDER — METHYLPREDNISOLONE SODIUM SUCC 40 MG IJ SOLR
40.0000 mg | Freq: Two times a day (BID) | INTRAMUSCULAR | Status: DC
  Administered 2020-08-31 – 2020-09-01 (×2): 40 mg via INTRAVENOUS
  Filled 2020-08-31 (×2): qty 1

## 2020-08-31 NOTE — Progress Notes (Signed)
1 Day Post-Op  Subjective: [name] was evaluated at bedside this morning and noted to be resting comfortably in bed. Her abdominal pain is well controlled on fentanyl injection at this time. Denies n/v, and has yet to pass flatus or have a bowel movement.  Objective: Vital signs in last 24 hours: Temp:  [97.4 F (36.3 C)-98.6 F (37 C)] 98.1 F (36.7 C) (09/01 0045) Pulse Rate:  [71-88] 88 (09/01 0700) Resp:  [0-19] 17 (09/01 0700) BP: (77-145)/(42-115) 123/82 (09/01 0700) SpO2:  [92 %-100 %] 100 % (09/01 0700) Weight:  [103.9 kg] 103.9 kg (08/31 0947) Last BM Date: 08/21/2020  Intake/Output from previous day: 08/31 0701 - 09/01 0700 In: 4769.2 [I.V.:4154.2; Blood:615] Out: 1160 [Urine:660; Blood:500] Intake/Output this shift: Total I/O In: 164.7 [I.V.:164.7] Out: -   Physical Exam Vitals and nursing note reviewed.  Constitutional: no acute distress, resting comfortably in bed Cardiovascular: regular rate and rhythm, normal heart sounds Pulmonary: effort normal, normal breath sounds bilaterally Abdominal: flat, incision site without erythema, purulence, or significant swelling Skin: warm and dry Neurological: alert, no focal deficit  Lab Results:  Recent Labs    08/23/2020 0638 08/23/2020 0638 08/07/2020 1555 08/31/20 0458  WBC 12.2*  --   --  37.4*  HGB 7.8*   < > 11.2* 9.9*  HCT 25.1*   < > 36.3 31.6*  PLT 273  --   --  220   < > = values in this interval not displayed.   BMET Recent Labs    08/14/2020 0638 08/31/20 0458  NA 139 142  K 3.5 3.9  CL 107 113*  CO2 22 19*  GLUCOSE 99 100*  BUN 19 18  CREATININE 1.15* 1.76*  CALCIUM 8.0* 6.9*    Studies/Results: DG Chest 1 View  Result Date: 08/26/2020 CLINICAL DATA:  Central line placement in OR EXAM: CHEST  1 VIEW COMPARISON:  Portable exam 1018 hours compared to 08/19/2020 FINDINGS: Tip of endotracheal tube 8 mm from carina. LEFT subclavian central venous catheter with tip projecting over SVC at azygous  vein confluence. LEFT lateral costal margin excluded, not repeated at ordering physician request. Normal heart size, mediastinal contours, and pulmonary vascularity for technique. Atherosclerotic calcification aorta. Peribronchial thickening and scattered perihilar opacities which could reflect atelectasis or subtle edema. No pleural effusion or pneumothorax. IMPRESSION: Tip of LEFT subclavian line projects over SVC at the level of the azygos vein confluence. No pneumothorax following central line placement. Tip of endotracheal tube 8 mm from carina, recommend withdrawal 1-2 cm. Findings called to [facility] OR 4 on 08/11/2020 at 1031 hours. Electronically Signed   By: Lavonia Dana M.D.   On: 08/17/2020 10:32    Anti-infectives: Anti-infectives (From admission, onward)   Start     Dose/Rate Route Frequency Ordered Stop   08/25/2020 0630  ertapenem (INVANZ) 1,000 mg in sodium chloride 0.9 % 100 mL IVPB        1 g 200 mL/hr over 30 Minutes Intravenous On call to O.R. 08/25/2020 0621 08/01/2020 1030      Assessment/Plan: s/p Procedure(s): Left Hemicolectomy CENTRAL LINE INSERTION [name] is a 79 year old african american female 1 day post-op from left hemicolectomy and central line insertion. Currently has good pain control and no nausea or vomiting. No bowel movements or flatus yet. Normal breath sounds bilaterally and saturating well s/p left subclavicular central line placement. Poor urine output and elevation in Cr to 1.76 from 1.15 indicate an AKI likely from low flow state due  to hypotension and levophed use in perioperative period. Plan to d/c levophed as able to in coming days. Will continue IV fluid resuscitation and monitor for improvement. Na trending up to 142 today, will switch to LR from NS. Mag low at 1.4, will replete. Hemodynamically stable today on Levophed, Hgb 9.9 today from 7.8 yesterday s/p 2 units pRBC.  Leukocytosis to 37.4  likely due to stress dose steroid administration in  perioperative period as well as reactive from surgical procedure. Will watch for improvement as steroids are reduced to home dosing in next several days. Pt is afebrile, received ppx dose of Invanz, and with no clear signs of infectious cause to suggest otherwise. -IV LR 1 L bolus  -IV LR 150 mL/hr -methylprednisolone 40 mg q12h -Mag sulfate 4g -fentanyl injection 12.59mcg q2h PRN -s/p 1g Ertapenem   LOS: 1 day    [name] 08/31/2020

## 2020-08-31 DEATH — deceased

## 2020-09-01 ENCOUNTER — Inpatient Hospital Stay (HOSPITAL_COMMUNITY): Payer: Medicare Other

## 2020-09-01 ENCOUNTER — Ambulatory Visit (HOSPITAL_COMMUNITY): Payer: Medicare Other | Admitting: Hematology

## 2020-09-01 ENCOUNTER — Encounter (HOSPITAL_COMMUNITY): Payer: Self-pay | Admitting: General Surgery

## 2020-09-01 LAB — URINALYSIS, COMPLETE (UACMP) WITH MICROSCOPIC
Bilirubin Urine: NEGATIVE
Glucose, UA: NEGATIVE mg/dL
Ketones, ur: NEGATIVE mg/dL
Leukocytes,Ua: NEGATIVE
Nitrite: NEGATIVE
Protein, ur: 30 mg/dL — AB
Specific Gravity, Urine: 1.015 (ref 1.005–1.030)
pH: 5 (ref 5.0–8.0)

## 2020-09-01 LAB — BASIC METABOLIC PANEL
Anion gap: 12 (ref 5–15)
BUN: 28 mg/dL — ABNORMAL HIGH (ref 8–23)
CO2: 17 mmol/L — ABNORMAL LOW (ref 22–32)
Calcium: 7 mg/dL — ABNORMAL LOW (ref 8.9–10.3)
Chloride: 111 mmol/L (ref 98–111)
Creatinine, Ser: 2.55 mg/dL — ABNORMAL HIGH (ref 0.44–1.00)
GFR calc Af Amer: 20 mL/min — ABNORMAL LOW (ref >60)
GFR calc non Af Amer: 18 mL/min — ABNORMAL LOW (ref >60)
Glucose, Bld: 171 mg/dL — ABNORMAL HIGH (ref 70–99)
Potassium: 4.6 mmol/L (ref 3.5–5.1)
Sodium: 140 mmol/L (ref 135–145)

## 2020-09-01 LAB — CBC WITH DIFFERENTIAL/PLATELET
Abs Immature Granulocytes: 0.68 10*3/uL — ABNORMAL HIGH (ref 0.00–0.07)
Basophils Absolute: 0.1 10*3/uL (ref 0.0–0.1)
Basophils Relative: 0 %
Eosinophils Absolute: 0 10*3/uL (ref 0.0–0.5)
Eosinophils Relative: 0 %
HCT: 24.8 % — ABNORMAL LOW (ref 36.0–46.0)
Hemoglobin: 7.8 g/dL — ABNORMAL LOW (ref 12.0–15.0)
Immature Granulocytes: 3 %
Lymphocytes Relative: 4 %
Lymphs Abs: 1.1 10*3/uL (ref 0.7–4.0)
MCH: 28.8 pg (ref 26.0–34.0)
MCHC: 31.5 g/dL (ref 30.0–36.0)
MCV: 91.5 fL (ref 80.0–100.0)
Monocytes Absolute: 0.8 10*3/uL (ref 0.1–1.0)
Monocytes Relative: 3 %
Neutro Abs: 23.8 10*3/uL — ABNORMAL HIGH (ref 1.7–7.7)
Neutrophils Relative %: 90 %
Platelets: 203 10*3/uL (ref 150–400)
RBC: 2.71 MIL/uL — ABNORMAL LOW (ref 3.87–5.11)
RDW: 19.3 % — ABNORMAL HIGH (ref 11.5–15.5)
WBC Morphology: INCREASED
WBC: 26.4 10*3/uL — ABNORMAL HIGH (ref 4.0–10.5)
nRBC: 0.3 % — ABNORMAL HIGH (ref 0.0–0.2)

## 2020-09-01 LAB — MAGNESIUM: Magnesium: 2.2 mg/dL (ref 1.7–2.4)

## 2020-09-01 LAB — GLUCOSE, CAPILLARY
Glucose-Capillary: 175 mg/dL — ABNORMAL HIGH (ref 70–99)
Glucose-Capillary: 169 mg/dL — ABNORMAL HIGH (ref 70–99)
Glucose-Capillary: 202 mg/dL — ABNORMAL HIGH (ref 70–99)
Glucose-Capillary: 234 mg/dL — ABNORMAL HIGH (ref 70–99)

## 2020-09-01 LAB — CBC
HCT: 24.9 % — ABNORMAL LOW (ref 36.0–46.0)
Hemoglobin: 8 g/dL — ABNORMAL LOW (ref 12.0–15.0)
MCH: 29.2 pg (ref 26.0–34.0)
MCHC: 32.1 g/dL (ref 30.0–36.0)
MCV: 90.9 fL (ref 80.0–100.0)
Platelets: 213 10*3/uL (ref 150–400)
RBC: 2.74 MIL/uL — ABNORMAL LOW (ref 3.87–5.11)
RDW: 19 % — ABNORMAL HIGH (ref 11.5–15.5)
WBC: 25.3 10*3/uL — ABNORMAL HIGH (ref 4.0–10.5)
nRBC: 0.2 % (ref 0.0–0.2)

## 2020-09-01 LAB — PHOSPHORUS: Phosphorus: 4.9 mg/dL — ABNORMAL HIGH (ref 2.5–4.6)

## 2020-09-01 LAB — CREATININE, URINE, RANDOM: Creatinine, Urine: 110.2 mg/dL

## 2020-09-01 LAB — CK: Total CK: 115 U/L (ref 38–234)

## 2020-09-01 LAB — SODIUM, URINE, RANDOM: Sodium, Ur: 30 mmol/L

## 2020-09-01 LAB — SURGICAL PATHOLOGY

## 2020-09-01 MED ORDER — METHYLPREDNISOLONE SODIUM SUCC 40 MG IJ SOLR
40.0000 mg | INTRAMUSCULAR | Status: DC
  Administered 2020-09-02 – 2020-09-06 (×5): 40 mg via INTRAVENOUS
  Filled 2020-09-01 (×5): qty 1

## 2020-09-01 MED ORDER — DEXTROSE-NACL 5-0.45 % IV SOLN: INTRAVENOUS | Status: DC

## 2020-09-01 MED ORDER — GABAPENTIN 300 MG PO CAPS
300.0000 mg | ORAL_CAPSULE | Freq: Every day | ORAL | Status: DC
  Administered 2020-09-05 – 2020-09-06 (×2): 300 mg via ORAL
  Filled 2020-09-01 (×3): qty 1

## 2020-09-01 MED ORDER — HEPARIN SODIUM (PORCINE) 5000 UNIT/ML IJ SOLN
5000.0000 [IU] | Freq: Three times a day (TID) | INTRAMUSCULAR | Status: DC
  Administered 2020-09-01 – 2020-09-07 (×19): 5000 [IU] via SUBCUTANEOUS
  Filled 2020-09-01 (×20): qty 1

## 2020-09-01 NOTE — Consult Note (Signed)
Reason for Consult: AKI Referring Physician:  Arnoldo Morale, MD  [name] is an 79 y.o. female with a PMH significant for HTN, DM, HLD, COPD, GERD, CKD stage IIIa, and colonic mass who was admitted on 08/10/2020 for partial colectomy due to near obstruction of sigmoid colon.  Her hospitalization was complicated by ABLA and hypotension requiring pressors.  She has received 2 units of PRBC's and is currently off of levophed, however she has developed oliguric AKI/CKD stage IIIa which prompted our consultation.  She had an episode of projectile vomiting this morning with bloody emesis per nursing.  The trend in Scr is seen below.  She is very somnolent this morning and nonverbal so this HPI was obtained from review of her EMR and discussions with medical staff.  Trend in Creatinine: Creatinine, Ser  Date/Time Value Ref Range Status  09/01/2020 05:15 AM 2.55 (H) 0.44 - 1.00 mg/dL Final  08/31/2020 04:58 AM 1.76 (H) 0.44 - 1.00 mg/dL Final  08/12/2020 06:38 AM 1.15 (H) 0.44 - 1.00 mg/dL Final  08/19/2020 07:00 PM 1.46 (H) 0.44 - 1.00 mg/dL Final  08/17/2020 05:08 AM 1.21 (H) 0.44 - 1.00 mg/dL Final  08/16/2020 06:11 AM 1.00 0.44 - 1.00 mg/dL Final  08/15/2020 05:50 AM 0.89 0.44 - 1.00 mg/dL Final  08/14/2020 08:12 AM 0.71 0.44 - 1.00 mg/dL Final  08/13/2020 02:59 AM 0.75 0.44 - 1.00 mg/dL Final  08/12/2020 05:32 PM 0.94 0.44 - 1.00 mg/dL Final  08/11/2020 01:35 PM 0.89 0.44 - 1.00 mg/dL Final  07/16/2020 02:00 PM 0.99 0.44 - 1.00 mg/dL Final  07/05/2020 04:52 AM 0.61 0.44 - 1.00 mg/dL Final  07/04/2020 04:47 AM 0.78 0.44 - 1.00 mg/dL Final  07/03/2020 01:43 PM 0.85 0.44 - 1.00 mg/dL Final  07/03/2020 12:00 PM 0.78 0.44 - 1.00 mg/dL Final  06/20/2020 05:56 AM 1.18 (H) 0.44 - 1.00 mg/dL Final  06/18/2020 10:00 AM 1.27 (H) 0.44 - 1.00 mg/dL Final  06/17/2020 06:41 AM 1.22 (H) 0.44 - 1.00 mg/dL Final  06/16/2020 05:15 AM 1.22 (H) 0.44 - 1.00 mg/dL Final  06/15/2020 08:01 AM 1.12 (H) 0.44 - 1.00  mg/dL Final  06/14/2020 06:36 AM 1.21 (H) 0.44 - 1.00 mg/dL Final  06/13/2020 05:37 AM 1.18 (H) 0.44 - 1.00 mg/dL Final  06/12/2020 09:00 AM 1.41 (H) 0.44 - 1.00 mg/dL Final  06/12/2020 12:20 AM 1.68 (H) 0.44 - 1.00 mg/dL Final  04/29/2020 01:20 PM 1.19 (H) 0.44 - 1.00 mg/dL Final  02/12/2020 12:35 PM 0.96 0.44 - 1.00 mg/dL Final  11/13/2019 12:34 PM 0.88 0.44 - 1.00 mg/dL Final  09/24/2019 01:28 PM 1.14 (H) 0.44 - 1.00 mg/dL Final  06/10/2019 01:36 PM 1.04 (H) 0.44 - 1.00 mg/dL Final  03/03/2019 12:44 PM 0.94 0.44 - 1.00 mg/dL Final  01/07/2019 04:01 PM 0.87 0.44 - 1.00 mg/dL Final  12/10/2018 01:05 PM 1.11 (H) 0.44 - 1.00 mg/dL Final  11/03/2018 12:25 PM 0.94 0.44 - 1.00 mg/dL Final  09/15/2018 11:13 AM 1.13 (H) 0.44 - 1.00 mg/dL Final  07/22/2018 12:32 PM 1.10 (H) 0.44 - 1.00 mg/dL Final  06/24/2018 04:10 PM 1.04 (H) 0.44 - 1.00 mg/dL Final  11/26/2017 12:35 PM 0.99 0.44 - 1.00 mg/dL Final  05/07/2017 11:58 AM 1.12 (H) 0.44 - 1.00 mg/dL Final  03/25/2017 03:07 PM 0.87 0.44 - 1.00 mg/dL Final  03/16/2016 01:54 PM 0.86 0.44 - 1.00 mg/dL Final  11/30/2015 04:09 PM 1.41 (H) 0.44 - 1.00 mg/dL Final  01/19/2014 09:33 AM 0.92 0.50 - 1.10 mg/dL Final  10/16/2013 03:03 PM 0.93 0.50 - 1.10 mg/dL Final  03/13/2012 11:10 AM 0.80 0.50 - 1.10 mg/dL Final  01/07/2012 05:00 AM 0.90 0.50 - 1.10 mg/dL Final  01/06/2012 05:25 AM 0.72 0.50 - 1.10 mg/dL Final  01/05/2012 06:23 PM 0.74 0.50 - 1.10 mg/dL Final  03/13/2011 11:16 PM 0.85 0.40 - 1.20 mg/dL Final  02/25/2011 06:04 PM 1.05 0.40 - 1.20 mg/dL Final  02/02/2011 01:00 PM 0.89 0.40 - 1.20 mg/dL Final  01/29/2011 06:25 PM 0.76 0.40 - 1.20 mg/dL Final    PMH:   Past Medical History:  Diagnosis Date  . Anemia in chronic renal disease 12/30/2015  . Anxiety   . Arthritis    "all over" (01/19/2014)  . Arthritis, lumbar spine   . Carpal tunnel syndrome   . Chronic bronchitis ([place])    "got it q yr for awhile; hasn't had it in awhile"  (01/19/2014)  . Chronic kidney disease (CKD) stage G3b/A1, moderately decreased glomerular filtration rate (GFR) between 30-44 mL/min/1.73 square meter and albuminuria creatinine ratio less than 30 mg/g 09/14/2009   Qualifier: Diagnosis of  By: Moshe Cipro MD, Joycelyn Schmid    . Complication of anesthesia    combative  . COPD (chronic obstructive pulmonary disease) ([place])   . Depression   . Diverticulosis 07/2004   Colonscopy Dr Gala Romney  . Esophagitis, erosive 2009  . Exertional shortness of breath   . Gastroesophageal reflux   . ZOXWRUEA(540.9)    "usually a couple times/wk" (01/19/2014)  . Heart murmur    saw cardiology In [place], he told her she did not need to come back.  . Hyperlipidemia   . Hypertension   . Impingement syndrome, shoulder   . Iron deficiency anemia   . Mitral regurgitation   . Myasthenia gravis   . Myasthenia gravis in remission ([place]) 11/24/2014  . Obesity   . OSA on CPAP    Negative on last sleep study  . Pneumonia 01/2012  . Pulmonary HTN ([place])   . Schatzki's ring    Last EGD w/ dilation 02/08/11, 2009 & 2007  . Seasonal allergies   . Shoulder pain   . Thyroid disease    "used to take RX; they took me off it" (01/19/2014)  . Tobacco abuse   . Type II diabetes mellitus ([facility])     PSH:   Past Surgical History:  Procedure Laterality Date  . [place] VITRECTOMY WITH 20 GAUGE MVR PORT Left 01/19/2014   Procedure: 25 GAUGE PARS PLANA VITRECTOMY WITH 20 GAUGE MVR PORT; MEMBRAME PEEL; SERUM PATCH; LASER TREATMENT; C3F8;  Surgeon: Hayden Pedro, MD;  Location: [place];  Service: Ophthalmology;  Laterality: Left;  . ABDOMINAL HYSTERECTOMY    . AGILE CAPSULE N/A 08/05/2019   Procedure: AGILE CAPSULE;  Surgeon: Daneil Dolin, MD;  [facility];  Service: Endoscopy;  Laterality: N/A;  7:30am  . APPENDECTOMY    . BIOPSY  08/18/2020   Procedure: BIOPSY;  Surgeon: Eloise Harman, DO;  [facility];  Service: Endoscopy;;  sigmoid colon  . CARPAL  TUNNEL RELEASE Bilateral   . CATARACT EXTRACTION W/PHACO Left 10/21/2013   Procedure: LEFT CATARACT EXTRACTION PHACO AND INTRAOCULAR LENS PLACEMENT (IOC);  Surgeon: Marylynn Pearson, MD;  Location: [place];  Service: Ophthalmology;  Laterality: Left;  . CHOLECYSTECTOMY    . COLONOSCOPY  05/08/2012   WJX:BJYNWGNF and external hemorrhoids; colonic diverticulosis  . COLONOSCOPY WITH PROPOFOL N/A 02/26/2019   two simple adenomas, diverticulosis,  and  internal hemorrhoids.   . ESOPHAGEAL DILATION     "more than 3 times" (01/19/2014)  . ESOPHAGOGASTRODUODENOSCOPY  02/08/11   [place]-Distal esophageal erosion consistent with mild erosive reflux   esophagitis/ Noncritical Schatzki ring, small hiatal hernia otherwise upper/ gastrointestinal tract appeared unremarkable, status post passage  of a Maloney dilation to biopsy disruption of the ring described  . ESOPHAGOGASTRODUODENOSCOPY  04/2012   2 tandem incomplete distal esophagea rings s/p dilation.   . ESOPHAGOGASTRODUODENOSCOPY N/A 09/16/2014   Dr. Gala Romney: Schatzki ring status post dilation/disruption.  Hiatal hernia.  Marland Kitchen ESOPHAGOGASTRODUODENOSCOPY (EGD) WITH PROPOFOL N/A 12/08/2018   EGD with mild Schatzki ring s/p dilation, small hiatal hernia, otherwise normal  . EYE SURGERY     cataracts, bilateral  . FLEXIBLE SIGMOIDOSCOPY  08/18/2020   Procedure: FLEXIBLE SIGMOIDOSCOPY;  Surgeon: Eloise Harman, DO;  [facility];  Service: Endoscopy;;  . GIVENS CAPSULE STUDY N/A 09/29/2019   Procedure: GIVENS CAPSULE STUDY;  Surgeon: Daneil Dolin, MD;  [facility];  Service: Endoscopy;  Laterality: N/A;  7:30am  . INCONTINENCE SURGERY  08/26/09   [place]  . JOINT REPLACEMENT     right total knee  . MALONEY DILATION N/A 09/16/2014   Procedure: Venia Minks DILATION;  Surgeon: Daneil Dolin, MD;  [facility];  Service: Endoscopy;  Laterality: N/A;  . Venia Minks DILATION N/A 12/08/2018   Procedure: Venia Minks DILATION;  Surgeon: Daneil Dolin,  MD;  [facility];  Service: Endoscopy;  Laterality: N/A;  . PARS PLANA VITRECTOMY W/ REPAIR OF MACULAR HOLE Left 01/19/2014  . POLYPECTOMY  02/26/2019   Procedure: POLYPECTOMY;  Surgeon: Danie Binder, MD;  [facility];  Service: Endoscopy;;  ascending colon polyps x2  . SAVORY DILATION N/A 09/16/2014   Procedure: SAVORY DILATION;  Surgeon: Daneil Dolin, MD;  [facility];  Service: Endoscopy;  Laterality: N/A;  . TONSILLECTOMY    . TOTAL KNEE ARTHROPLASTY Right 05/13/07   Dr. Aline Brochure  . TRIGGER FINGER RELEASE Right 06/26/2018   Procedure: RIGHT INDEX FINGER TRIGGER FINGER/A-1 PULLEY RELEASE;  Surgeon: Carole Civil, MD;  [facility];  Service: Orthopedics;  Laterality: Right;    Allergies: No Known Allergies  Medications:   Prior to Admission medications   Medication Sig Start Date End Date Taking? Authorizing Provider  acetaminophen (TYLENOL) 325 MG tablet Take 2 tablets (650 mg total) by mouth every 6 (six) hours as needed for mild pain, fever or headache (or Fever >/= 101). 06/20/20  Yes Emokpae, Courage, MD  brimonidine-timolol (COMBIGAN) 0.2-0.5 % ophthalmic solution Place 1 drop into both eyes every 12 (twelve) hours. 08/08/20  Yes Gerlene Fee, NP  Cholecalciferol (VITAMIN D3) 2000 units TABS Take 2,000 Units by mouth at bedtime.   Yes [provider]  citalopram (CELEXA) 10 MG tablet Take 1 tablet (10 mg total) by mouth daily. 08/08/20  Yes Gerlene Fee, NP  Cranberry 1000 MG CAPS Take 1,000 mg by mouth daily.   Yes [provider]  cycloSPORINE (RESTASIS) 0.05 % ophthalmic emulsion Place 1 drop into both eyes 2 (two) times daily. 08/08/20  Yes Gerlene Fee, NP  diltiazem (CARDIZEM CD) 120 MG 24 hr capsule Take 1 capsule (120 mg total) by mouth daily. 08/08/20  Yes Gerlene Fee, NP  ezetimibe (ZETIA) 10 MG tablet Take 1 tablet (10 mg total) by mouth daily. 08/08/20  Yes Gerlene Fee, NP  famotidine (PEPCID)  40 MG tablet  Take 1 tablet (40 mg total) by mouth every evening. 08/08/20  Yes Gerlene Fee, NP  gabapentin (NEURONTIN) 300 MG capsule TAKE (1) CAPSULE BY MOUTH TWICE DAILY. 08/08/20  Yes Gerlene Fee, NP  hydrocortisone (ANUSOL-HC) 2.5 % rectal cream Place 1 application rectally 2 (two) times daily. 07/01/20  Yes Erenest Rasher, PA-C  Multiple Vitamin (MULTIVITAMIN WITH MINERALS) TABS tablet Take 1 tablet by mouth at bedtime.   Yes [provider]  nystatin (NYSTATIN) powder Apply 1 application topically daily as needed. Special Instructions: fungal rash to groin and inguinal folds 06/20/20  Yes [provider]  ondansetron (ZOFRAN) 4 MG tablet Take 1 tablet (4 mg total) by mouth every 8 (eight) hours as needed for nausea or vomiting. 07/01/20  Yes Erenest Rasher, PA-C  predniSONE (DELTASONE) 5 MG tablet Take 1 tablet (5 mg total) by mouth daily. 08/08/20  Yes Gerlene Fee, NP  Probiotic Product (RISA-BID PROBIOTIC PO) Take 1 tablet by mouth in the morning and at bedtime.   Yes [provider]  pyridostigmine (MESTINON) 60 MG tablet Take three times daily 08/08/20  Yes Gerlene Fee, NP  rosuvastatin (CRESTOR) 40 MG tablet TAKE (1) TABLET BY MOUTH AT BEDTIME. 08/08/20  Yes Gerlene Fee, NP  solifenacin (VESICARE) 10 MG tablet Take 1 tablet (10 mg total) by mouth daily. 08/08/20  Yes Gerlene Fee, NP  venlafaxine XR (EFFEXOR-XR) 150 MG 24 hr capsule Take 1 capsule (150 mg total) by mouth daily. 08/08/20  Yes Gerlene Fee, NP  albuterol (PROAIR HFA) 108 (90 Base) MCG/ACT inhaler Inhale 2 puffs into the lungs every 6 (six) hours as needed. 08/08/20   Gerlene Fee, NP  cetirizine (ZYRTEC) 10 MG tablet TAKE 1 TABLET BY MOUTH ONCE A DAY. 05/17/20   Fayrene Helper, MD  fluticasone [facility]) 50 MCG/ACT nasal spray Place 2 sprays into both nostrils daily. 08/08/20   Gerlene Fee, NP  insulin lispro (HUMALOG KWIKPEN) 100 UNIT/ML KwikPen Inject 0.03 mLs (3 Units  total) into the skin 3 (three) times daily. insulin pen; 100 unit/mL; amt: Per Sliding Scale; If Blood Sugar is 0 to 149, give 0 Units. If Blood Sugar is greater than 149, give 3 Units. subcutaneous Before Meals 08/08/20   Gerlene Fee, NP    Inpatient medications: . alvimopan  12 mg Oral BID  . brimonidine  1 drop Both Eyes Q12H  . Chlorhexidine Gluconate Cloth  6 each Topical Daily  . citalopram  10 mg Oral Daily  . cycloSPORINE  1 drop Both Eyes BID  . diltiazem  120 mg Oral Daily  . ezetimibe  10 mg Oral Daily  . fluticasone  2 spray Each Nare Daily  . gabapentin  300 mg Oral BID  . heparin injection (subcutaneous)  5,000 Units Subcutaneous Q8H  . insulin aspart  0-20 Units Subcutaneous TID WC  . [START ON 09/02/2020] methylPREDNISolone (SOLU-MEDROL) injection  40 mg Intravenous Q24H  . multivitamin with minerals  1 tablet Oral QHS  . pantoprazole  40 mg Oral Q1200  . rosuvastatin  40 mg Oral QHS  . sodium chloride flush  10-40 mL Intracatheter Q12H  . timolol  1 drop Both Eyes BID  . venlafaxine XR  150 mg Oral Daily    Discontinued Meds:   Medications Discontinued During This Encounter  Medication Reason  . Chlorhexidine Gluconate Cloth 2 % PADS 6 each   . Chlorhexidine Gluconate Cloth 2 % PADS 6 each   .  chlorhexidine (PERIDEX) 0.12 % solution 15 mL   . MEDLINE mouth rinse   . HYDROmorphone (DILAUDID) injection 0.25-0.5 mg   . ondansetron (ZOFRAN) injection 4 mg   . 0.9 %  sodium chloride infusion Patient Transfer  . hemostatic agents   . hemostatic agents   . bupivacaine liposome (EXPAREL) 1.3 % injection   . povidone-iodine (BETADINE) 10 % ointment   . 0.9 % irrigation (POUR BTL)   . brimonidine-timolol (COMBIGAN) 0.2-0.5 % ophthalmic solution 1 drop   . 0.9 %  sodium chloride infusion   . predniSONE (DELTASONE) tablet 5 mg   . 0.9 %  sodium chloride infusion   . methylPREDNISolone sodium succinate (SOLU-MEDROL) 125 mg/2 mL injection 80 mg   . enoxaparin (LOVENOX)  injection 40 mg   . lactated ringers infusion   . methylPREDNISolone sodium succinate (SOLU-MEDROL) 40 mg/mL injection 40 mg     Social History:  reports that she quit smoking about 16 years ago. Her smoking use included cigarettes. She has a 12.50 pack-year smoking history. She has never used smokeless tobacco. She reports that she does not drink alcohol and does not use drugs.  Family History:   Family History  Problem Relation Age of Onset  . Cancer Mother   . Heart disease Mother   . Cancer Father   . Heart disease Father   . Diabetes Sister   . Hypertension Brother   . Heart disease Brother   . Cancer Sister   . Kidney failure Sister   . Diabetes Son   . Hypertension Son   . Hypertension Daughter   . Colon cancer Neg Hx     Review of systems not obtained due to patient factors. Weight change: 9.2 kg  Intake/Output Summary (Last 24 hours) at 09/01/2020 1126 Last data filed at 08/31/2020 1800 Gross per 24 hour  Intake 1940.02 ml  Output 75 ml  Net 1865.02 ml   BP (!) 121/45   Pulse 87   Temp (!) 97.5 F (36.4 C) (Axillary)   Resp (!) 23   Ht 6' (1.829 m)   Wt 111.7 kg   SpO2 95%   BMI 33.40 kg/m  Vitals:   09/01/20 0500 09/01/20 0600 09/01/20 0742 09/01/20 1123  BP: (!) 120/49 (!) 121/45    Pulse: 93  87   Resp: 20 20 (!) 25 (!) 23  Temp:   (!) 97.3 F (36.3 C) (!) 97.5 F (36.4 C)  TempSrc:   Axillary Axillary  SpO2: 94%  95%   Weight:      Height:         General appearance: lethargic and nonverbal Head: Normocephalic, without obvious abnormality, atraumatic Eyes: negative findings: lids and lashes normal, conjunctivae and sclerae normal and corneas clear Resp: clear to auscultation bilaterally Cardio: regular rate and rhythm, S1, S2 normal, no murmur, click, rub or gallop GI: obese, hypoactive BS, vertical incision without erytehma or drainage, mildly tender to palpation Extremities: edema 1+ edema of extremities  Labs: Basic Metabolic  Panel: Recent Labs  Lab 08/19/2020 0638 08/31/20 0458 09/01/20 0515  NA 139 142 140  K 3.5 3.9 4.6  CL 107 113* 111  CO2 22 19* 17*  GLUCOSE 99 100* 171*  BUN 19 18 28*  CREATININE 1.15* 1.76* 2.55*  CALCIUM 8.0* 6.9* 7.0*  PHOS  --  4.9* 4.9*   Liver Function Tests: No results for input(s): AST, ALT, ALKPHOS, BILITOT, PROT, ALBUMIN in the last 168 hours. No results for input(s): LIPASE,  AMYLASE in the last 168 hours. No results for input(s): AMMONIA in the last 168 hours. CBC: Recent Labs  Lab 08/09/2020 0638 08/09/2020 1555 08/31/20 0458 09/01/20 0515  WBC 12.2*  --  37.4* 25.3*  NEUTROABS 8.8*  --   --   --   HGB 7.8* 11.2* 9.9* 8.0*  HCT 25.1* 36.3 31.6* 24.9*  MCV 90.3  --  92.1 90.9  PLT 273  --  220 213   PT/INR: @LABRCNTIP (inr:5) Cardiac Enzymes: )No results for input(s): CKTOTAL, CKMB, CKMBINDEX, TROPONINI in the last 168 hours. CBG: Recent Labs  Lab 08/31/20 1212 08/31/20 1624 08/31/20 2005 09/01/20 0741 09/01/20 1121  GLUCAP 95 112* 131* 175* 169*    Iron Studies: No results for input(s): IRON, TIBC, TRANSFERRIN, FERRITIN in the last 168 hours.  Xrays/Other Studies: No results found.   Assessment/Plan: 1.  Oliguric, AKI/CKD stage IIIa- presumably due to ischemic ATN in setting of ABLA and hypotension requiring pressors for 2 days but currently off of pressors.  No ACE/ARB or NSAIDs given. 1. Will order renal US to r/o obstruction and evaluate kidneys 2. Will order urine studies 3. Renal dose medications 4. Continue to follow UOP and daily Scr 5. No indication for dialysis at this time and will need to follow closely 6. Hold off on lasix for now but may need higher dose if volume status worsens. 2. AMS- unclear etiology, possibly due to meds.  Decrease gabapentin to 300 mg daily. 3. Metabolic acidosis- due to #1.  Cont to follow 4. ABLA- s/p transfusion but now with episode of hematemesis, will recheck CBC now and transfuse prn. 5. Colonic mass-  s/p left hemicolectomy 6. Hypotension- improved but would keep central line access given N/V and borderline bp 7. DM- per primary   [place]ner 09/01/2020, 11:26 AM

## 2020-09-01 NOTE — Progress Notes (Signed)
2 Days Post-Op  Subjective: [name] appeared tired today and interacted to a limited degree on exam. She denies nausea and vomiting, but continues to have stomach pain. She has yet to have a bowel movement or flatus.  Objective: Vital signs in last 24 hours: Temp:  [97.3 F (36.3 C)-99.1 F (37.3 C)] 97.3 F (36.3 C) (09/02 0742) Pulse Rate:  [85-95] 87 (09/02 0742) Resp:  [19-25] 25 (09/02 0742) BP: (115-155)/(25-69) 121/45 (09/02 0600) SpO2:  [82 %-100 %] 95 % (09/02 0742) Weight:  [111.7 kg] 111.7 kg (09/02 0407) Last BM Date: 08/04/2020  Intake/Output from previous day: 09/01 0701 - 09/02 0700 In: 2523.8 [P.O.:120; I.V.:1211.9; IV Piggyback:1191.8] Out: 75 [Urine:75] Intake/Output this shift: No intake/output data recorded.  Physical Exam Vitals and nursing note reviewed.  Constitutional: no acute distress, tired appearing but arousable to voice Cardiovascular: regular rate and rhythm, normal heart sounds Pulmonary: effort normal, normal breath sounds bilaterally Abdominal: flat, mild TTP, no bowel sounds, incision site without erythema, purulence, or significant swelling Skin: warm and dry  Lab Results:  Recent Labs    08/31/20 0458 09/01/20 0515  WBC 37.4* 25.3*  HGB 9.9* 8.0*  HCT 31.6* 24.9*  PLT 220 213   BMET Recent Labs    08/31/20 0458 09/01/20 0515  NA 142 140  K 3.9 4.6  CL 113* 111  CO2 19* 17*  GLUCOSE 100* 171*  BUN 18 28*  CREATININE 1.76* 2.55*  CALCIUM 6.9* 7.0*   PT/INR No results for input(s): LABPROT, INR in the last 72 hours.  Studies/Results: DG Chest 1 View  Result Date: 08/03/2020 CLINICAL DATA:  Central line placement in OR EXAM: CHEST  1 VIEW COMPARISON:  Portable exam 1018 hours compared to 08/19/2020 FINDINGS: Tip of endotracheal tube 8 mm from carina. LEFT subclavian central venous catheter with tip projecting over SVC at azygous vein confluence. LEFT lateral costal margin excluded, not repeated at ordering physician  request. Normal heart size, mediastinal contours, and pulmonary vascularity for technique. Atherosclerotic calcification aorta. Peribronchial thickening and scattered perihilar opacities which could reflect atelectasis or subtle edema. No pleural effusion or pneumothorax. IMPRESSION: Tip of LEFT subclavian line projects over SVC at the level of the azygos vein confluence. No pneumothorax following central line placement. Tip of endotracheal tube 8 mm from carina, recommend withdrawal 1-2 cm. Findings called to [facility] OR 4 on 08/18/2020 at 1031 hours. Electronically Signed   By: Lavonia Dana M.D.   On: 08/13/2020 10:32    Anti-infectives: Anti-infectives (From admission, onward)   Start     Dose/Rate Route Frequency Ordered Stop   08/11/2020 0630  ertapenem (INVANZ) 1,000 mg in sodium chloride 0.9 % 100 mL IVPB        1 g 200 mL/hr over 30 Minutes Intravenous On call to O.R. 08/23/2020 0621 08/01/2020 1030      Assessment/Plan: s/p Procedure(s): Left Hemicolectomy CENTRAL LINE INSERTION [name] is a 79 year old african american female 2 days post-op from left hemicolectomy and central line insertion. [name] was quite tired appearing and minimally interactive on exam this morning. She reports that she did not sleep well last night. She has some incision site abdominal pain but this is well controlled. Her urine output has been minimal since surgery and her Cr this AM is up to 2.55 from 1.76. Additionally bicarb down to 17 from baseline ~22 on 8/31. She is likely in oliguric renal failure due to ATN from hypotension and levophed use. For this reason,  will keep foley catheter to monitor strict I/O. Nephro consulted for further evaluation and recommendation. Mag improved to 2.2 today from 1.4. K increased to 4.6, switched from LR to D5 1/2 NS today to avoid hyperkalemia and hypernatremia in the setting of renal failure. Changed DVT ppx to SQ heparin from lovenox. WBC decreased from 37.4 to 25.3 and Hct  decreased from 31.6 to 24.9, this is likely dilutional due to fluid resuscitation. Will continue to trend. -NPO, progress to clear liquids after flatus/BM -maintain foley catheter due to renal failure -Strict I/O -nephrology consulted, appreciate recommendations -solumedrol 40 mg once today -D5 1/2 NS 100 mL/hr -fentanyl injection, 12.5 mcg q2h PRN -Heparin -s/p 1g Ertapenem   LOS: 2 days    [name] 09/01/2020

## 2020-09-01 NOTE — Progress Notes (Signed)
Patient projectile vomited bloody emesis. Dr. Arnoldo Morale made aware. New orders received for patient to be NPO except ice chips. Will continue to monitor.

## 2020-09-02 ENCOUNTER — Inpatient Hospital Stay (HOSPITAL_COMMUNITY): Payer: Medicare Other

## 2020-09-02 DIAGNOSIS — I5030 Unspecified diastolic (congestive) heart failure: Secondary | ICD-10-CM

## 2020-09-02 LAB — RENAL FUNCTION PANEL
Albumin: 1.1 g/dL — ABNORMAL LOW (ref 3.5–5.0)
Anion gap: 8 (ref 5–15)
BUN: 41 mg/dL — ABNORMAL HIGH (ref 8–23)
CO2: 19 mmol/L — ABNORMAL LOW (ref 22–32)
Calcium: 6.6 mg/dL — ABNORMAL LOW (ref 8.9–10.3)
Chloride: 111 mmol/L (ref 98–111)
Creatinine, Ser: 3.18 mg/dL — ABNORMAL HIGH (ref 0.44–1.00)
GFR calc Af Amer: 16 mL/min — ABNORMAL LOW (ref >60)
GFR calc non Af Amer: 13 mL/min — ABNORMAL LOW (ref >60)
Glucose, Bld: 338 mg/dL — ABNORMAL HIGH (ref 70–99)
Phosphorus: 4.2 mg/dL (ref 2.5–4.6)
Potassium: 4.6 mmol/L (ref 3.5–5.1)
Sodium: 138 mmol/L (ref 135–145)

## 2020-09-02 LAB — GLUCOSE, CAPILLARY
Glucose-Capillary: 267 mg/dL — ABNORMAL HIGH (ref 70–99)
Glucose-Capillary: 330 mg/dL — ABNORMAL HIGH (ref 70–99)
Glucose-Capillary: 269 mg/dL — ABNORMAL HIGH (ref 70–99)
Glucose-Capillary: 156 mg/dL — ABNORMAL HIGH (ref 70–99)

## 2020-09-02 LAB — HEMOGLOBIN A1C
Hgb A1c MFr Bld: 5.6 % (ref 4.8–5.6)
Mean Plasma Glucose: 114.02 mg/dL

## 2020-09-02 LAB — ECHOCARDIOGRAM COMPLETE
AR max vel: 2.12 cm2
AV Area VTI: 2.39 cm2
AV Area mean vel: 2.04 cm2
AV Mean grad: 5.5 mmHg
AV Peak grad: 13.3 mmHg
Ao pk vel: 1.82 m/s
Area-P 1/2: 3.68 cm2
Height: 72 in
S' Lateral: 2.79 cm
Weight: 3992.97 oz

## 2020-09-02 LAB — BLOOD GAS, ARTERIAL
Acid-base deficit: 4.9 mmol/L — ABNORMAL HIGH (ref 0.0–2.0)
Bicarbonate: 20.2 mmol/L (ref 20.0–28.0)
FIO2: 32
O2 Saturation: 83.3 %
Patient temperature: 37
pCO2 arterial: 35 mmHg (ref 32.0–48.0)
pH, Arterial: 7.365 (ref 7.350–7.450)
pO2, Arterial: 54 mmHg — ABNORMAL LOW (ref 83.0–108.0)

## 2020-09-02 LAB — CBC
HCT: 22.5 % — ABNORMAL LOW (ref 36.0–46.0)
Hemoglobin: 7.1 g/dL — ABNORMAL LOW (ref 12.0–15.0)
MCH: 28.6 pg (ref 26.0–34.0)
MCHC: 31.6 g/dL (ref 30.0–36.0)
MCV: 90.7 fL (ref 80.0–100.0)
Platelets: 184 10*3/uL (ref 150–400)
RBC: 2.48 MIL/uL — ABNORMAL LOW (ref 3.87–5.11)
RDW: 19 % — ABNORMAL HIGH (ref 11.5–15.5)
WBC: 31 10*3/uL — ABNORMAL HIGH (ref 4.0–10.5)
nRBC: 0.3 % — ABNORMAL HIGH (ref 0.0–0.2)

## 2020-09-02 LAB — PREPARE RBC (CROSSMATCH)

## 2020-09-02 LAB — MAGNESIUM: Magnesium: 2.4 mg/dL (ref 1.7–2.4)

## 2020-09-02 MED ORDER — FUROSEMIDE 10 MG/ML IJ SOLN
40.0000 mg | Freq: Two times a day (BID) | INTRAMUSCULAR | Status: DC
  Administered 2020-09-02 – 2020-09-05 (×7): 40 mg via INTRAVENOUS
  Filled 2020-09-02 (×7): qty 4

## 2020-09-02 MED ORDER — SODIUM CHLORIDE 0.9% IV SOLUTION: Freq: Once | INTRAVENOUS | Status: AC

## 2020-09-02 MED ORDER — INSULIN ASPART 100 UNIT/ML ~~LOC~~ SOLN: 0.0000 [IU] | Freq: Three times a day (TID) | SUBCUTANEOUS | Status: DC

## 2020-09-02 MED ORDER — PIPERACILLIN-TAZOBACTAM 3.375 G IVPB
3.3750 g | Freq: Three times a day (TID) | INTRAVENOUS | Status: DC
  Administered 2020-09-02 – 2020-09-03 (×3): 3.375 g via INTRAVENOUS
  Filled 2020-09-02 (×3): qty 50

## 2020-09-02 MED ORDER — ALBUMIN HUMAN 25 % IV SOLN
25.0000 g | Freq: Once | INTRAVENOUS | Status: AC
  Administered 2020-09-02: 25 g via INTRAVENOUS
  Filled 2020-09-02: qty 100

## 2020-09-02 MED ORDER — PIPERACILLIN-TAZOBACTAM 3.375 G IVPB
3.3750 g | Freq: Once | INTRAVENOUS | Status: AC
  Administered 2020-09-02: 3.375 g via INTRAVENOUS
  Filled 2020-09-02: qty 50

## 2020-09-02 NOTE — Progress Notes (Addendum)
[facility]  Results for SHARENE, KRIKORIAN (MRN [id number]) as of 09/02/2020 12:14  Ref. Range 09/02/2020 11:55  FIO2 Unknown 32.00  pH, Arterial Latest Ref Range: 7.35 - 7.45  7.365  pCO2 arterial Latest Ref Range: 32 - 48 mmHg 35.0  pO2, Arterial Latest Ref Range: 83 - 108 mmHg 54.0 (L)  Acid-base deficit Latest Ref Range: 0.0 - 2.0 mmol/L 4.9 (H)  Bicarbonate Latest Ref Range: 20.0 - 28.0 mmol/L 20.2  O2 Saturation Latest Units: % 83.3  Patient temperature Unknown 37.0  Allens test (pass/fail) Latest Ref Range: PASS  PASS    ABG reviewed. CO2 wnl. O2 sats mid 90s on 4L. Will continue to monitor. Bipap would not be a great option due to vomiting in patient, but hopefully some more chest physiotherapy and turning. Will see if RT thinks NTS is option when they come by.   Curlene Labrum, MD [facility] [address] [place], [place] [address] [phone] (office)

## 2020-09-02 NOTE — Progress Notes (Signed)
[facility]  Checked on patient and respond with grimace to stimulation but not really opening eyes.  Protecting her airway now but is having significant upper respiratory sounds.  PNA concern on CXR and Dr. Arnoldo Morale started on Antibiotics.  ECHO ordered and pending. Worsening renal failure.  Patient may ultimately end up needing to be reintubated.  ABG ordered to get baseline so we know how she does over the next few hours/ days given mental status currently.  Curlene Labrum, MD [facility] [address] [place], [place] [address] [phone] (office)

## 2020-09-02 NOTE — Progress Notes (Signed)
Inpatient Diabetes Program Recommendations  AACE/ADA: New Consensus Statement on Inpatient Glycemic Control (2015)  Target Ranges:  Prepandial:   less than 140 mg/dL      Peak postprandial:   less than 180 mg/dL (1-2 hours)      Critically ill patients:  140 - 180 mg/dL   Lab Results  Component Value Date   GLUCAP 330 (H) 09/02/2020   HGBA1C 5.6 09/02/2020    Review of Glycemic Control  Results for ORPAH, HAUSNER (MRN [id number]) as of 09/02/2020 13:15  Ref. Range 09/01/2020 16:09 09/01/2020 21:07 09/02/2020 07:52 09/02/2020 11:45  Glucose-Capillary Latest Ref Range: 70 - 99 mg/dL 202 (H) 234 (H) 267 (H) 330 (H)   Diabetes history:  DM2  Outpatient Diabetes medications:  Humalog 3 units tid if cbg >149 mg/dl  Current orders for Inpatient glycemic control:  Novolog 0-20 tid Solumedrol 40 mg Q12H (started early this morning)  Inpatient Diabetes Program Recommendations:     Patient NPO and high risk for reintubation.  Please consider,  Novolog 0-20 Q4H  Will continue to follow while inpatient.  Thank you, Reche Dixon, RN, BSN Diabetes Coordinator Inpatient Diabetes Program [phone] (team pager from 8a-5p)

## 2020-09-02 NOTE — Progress Notes (Signed)
Pharmacy Antibiotic Note  [name] is a 79 y.o. female admitted on 08/23/2020 with intra-abdominal infection.  Pharmacy has been consulted for Zosyn dosing.  Plan: Zosyn 3.375g IV q8h (4 hour infusion). Monitor labs, c/s, and patient improvement.  Height: 6' (182.9 cm) Weight: 113.2 kg (249 lb 9 oz) IBW/kg (Calculated) : 73.1  Temp (24hrs), Avg:97.9 F (36.6 C), Min:97.5 F (36.4 C), Max:98.2 F (36.8 C)  Recent Labs  Lab 08/10/2020 0638 08/31/20 0458 09/01/20 0515 09/01/20 1320 09/02/20 0557  WBC 12.2* 37.4* 25.3* 26.4* 31.0*  CREATININE 1.15* 1.76* 2.55*  --  3.18*    Estimated Creatinine Clearance: 20.8 mL/min (A) (by C-G formula based on SCr of 3.18 mg/dL (H)).    No Known Allergies  Antimicrobials this admission: Zosyn 9/3 >>     Microbiology results: 8/31 MRSA PCR: neg  Thank you for allowing pharmacy to be a part of this patient's care.  Ramond Craver 09/02/2020 11:03 AM

## 2020-09-02 NOTE — Progress Notes (Signed)
*  PRELIMINARY RESULTS* Echocardiogram 2D Echocardiogram has been performed.  Leavy Cella 09/02/2020, 1:45 PM

## 2020-09-02 NOTE — Progress Notes (Addendum)
Patient ID: [name], female   DOB: 17-Jul-1941, 79 y.o.   MRN: [id number] S: No events overnight but remains nonverbal and lethargic.  UOP starting to increase but with worsening shortness of breath yesterday after N/V. O:BP (!) 117/42   Pulse 89   Temp 98.1 F (36.7 C) (Oral)   Resp 17   Ht 6' (1.829 m)   Wt 113.2 kg   SpO2 92%   BMI 33.85 kg/m   Intake/Output Summary (Last 24 hours) at 09/02/2020 0911 Last data filed at 09/02/2020 0300 Gross per 24 hour  Intake --  Output 120 ml  Net -120 ml   Intake/Output: I/O last 3 completed shifts: In: -  Out: 120 [Urine:120]  Intake/Output this shift:  No intake/output data recorded. Weight change: 1.5 kg Gen: obese, lethargic and nonverbal, minimally responsive CVS: RRR no rub Resp: scattered transmitted upper airway sounds/girgling  Abd: obese, hypoactive bowel sounds, nontender, vertical incision c/d/i Ext: 1+ edema  Recent Labs  Lab 08/28/2020 0638 08/31/20 0458 09/01/20 0515 09/02/20 0557  NA 139 142 140 138  K 3.5 3.9 4.6 4.6  CL 107 113* 111 111  CO2 22 19* 17* 19*  GLUCOSE 99 100* 171* 338*  BUN 19 18 28* 41*  CREATININE 1.15* 1.76* 2.55* 3.18*  ALBUMIN  --   --   --  1.1*  CALCIUM 8.0* 6.9* 7.0* 6.6*  PHOS  --  4.9* 4.9* 4.2   Liver Function Tests: Recent Labs  Lab 09/02/20 0557  ALBUMIN 1.1*   No results for input(s): LIPASE, AMYLASE in the last 168 hours. No results for input(s): AMMONIA in the last 168 hours. CBC: Recent Labs  Lab 08/12/2020 0638 08/02/2020 1555 08/31/20 0458 08/31/20 0458 09/01/20 0515 09/01/20 1320 09/02/20 0557  WBC 12.2*  --  37.4*   < > 25.3* 26.4* 31.0*  NEUTROABS 8.8*  --   --   --   --  23.8*  --   HGB 7.8*   < > 9.9*   < > 8.0* 7.8* 7.1*  HCT 25.1*   < > 31.6*   < > 24.9* 24.8* 22.5*  MCV 90.3  --  92.1  --  90.9 91.5 90.7  PLT 273  --  220   < > 213 203 184   < > = values in this interval not displayed.   Cardiac Enzymes: Recent Labs  Lab 09/01/20 1320  CKTOTAL  115   CBG: Recent Labs  Lab 09/01/20 0741 09/01/20 1121 09/01/20 1609 09/01/20 2107 09/02/20 0752  GLUCAP 175* 169* 202* 234* 267*    Iron Studies: No results for input(s): IRON, TIBC, TRANSFERRIN, FERRITIN in the last 72 hours. Studies/Results: US RENAL  Result Date: 09/01/2020 CLINICAL DATA:  Acute renal insufficiency/chronic renal disease stage III B. EXAM: RENAL / URINARY TRACT ULTRASOUND COMPLETE COMPARISON:  None. FINDINGS: Right Kidney: Renal measurements: 8.6 cm x 4.5 cm x 5.0 cm = volume: 99.15 mL. Echogenicity within normal limits. 1.0 cm x 0.9 cm x 1.0 cm and 1.8 cm x 1.8 cm x 2.3 cm anechoic structures are seen within the mid and upper right kidney. No hydronephrosis visualized. Left Kidney: Renal measurements: 10.5 cm x 6.4 cm x 5.7 cm = volume: 201.2 mL. Echogenicity within normal limits. A 1.7 cm x 1.7 cm x 2.3 cm anechoic structure is seen within the mid left kidney. No hydronephrosis visualized. Bladder: A Foley catheter is within the urinary bladder. A 3.3 cm x 3.1 cm x 2.1 cm heterogeneous  mildly echogenic focus is seen within the lumen of the urinary bladder. Other: None. IMPRESSION: 1. Findings which may represent a moderate amount of blood products within the urinary bladder. Additional evaluation with abdomen and pelvis CT is recommended. 2. Bilateral renal cysts. Electronically Signed   By: Virgina Norfolk M.D.   On: 09/01/2020 17:07   DG Chest Port 1 View  Result Date: 09/02/2020 CLINICAL DATA:  Increased shortness of breath. History of pneumonia. EXAM: PORTABLE CHEST 1 VIEW COMPARISON:  08/14/2020. FINDINGS: Interim removal of endotracheal tube. Left subclavian line stable position. Small catheter noted over the left neck. Stable cardiomegaly. Progressive bibasilar atelectasis/infiltrates. No pleural effusion. No pneumothorax. IMPRESSION: 1. Interim removal endotracheal tube. Left subclavian line in stable position. Small catheter noted over the left neck. 2.  Stable  cardiomegaly. 3.  Progressive bibasilar atelectasis/infiltrates.  No pneumothorax. Electronically Signed   By: Marcello Moores  Register   On: 09/02/2020 07:43   . brimonidine  1 drop Both Eyes Q12H  . Chlorhexidine Gluconate Cloth  6 each Topical Daily  . citalopram  10 mg Oral Daily  . cycloSPORINE  1 drop Both Eyes BID  . diltiazem  120 mg Oral Daily  . ezetimibe  10 mg Oral Daily  . fluticasone  2 spray Each Nare Daily  . gabapentin  300 mg Oral QHS  . heparin injection (subcutaneous)  5,000 Units Subcutaneous Q8H  . insulin aspart  0-20 Units Subcutaneous TID WC  . methylPREDNISolone (SOLU-MEDROL) injection  40 mg Intravenous Q24H  . multivitamin with minerals  1 tablet Oral QHS  . pantoprazole  40 mg Oral Q1200  . rosuvastatin  40 mg Oral QHS  . sodium chloride flush  10-40 mL Intracatheter Q12H  . timolol  1 drop Both Eyes BID  . venlafaxine XR  150 mg Oral Daily    BMET    Component Value Date/Time   NA 138 09/02/2020 0557   K 4.6 09/02/2020 0557   CL 111 09/02/2020 0557   CO2 19 (L) 09/02/2020 0557   GLUCOSE 338 (H) 09/02/2020 0557   BUN 41 (H) 09/02/2020 0557   CREATININE 3.18 (H) 09/02/2020 0557   CREATININE 1.11 (H) 02/10/2020 1140   CALCIUM 6.6 (L) 09/02/2020 0557   GFRNONAA 13 (L) 09/02/2020 0557   GFRNONAA 48 (L) 02/10/2020 1140   GFRAA 16 (L) 09/02/2020 0557   GFRAA 56 (L) 02/10/2020 1140   CBC    Component Value Date/Time   WBC 31.0 (H) 09/02/2020 0557   RBC 2.48 (L) 09/02/2020 0557   HGB 7.1 (L) 09/02/2020 0557   HCT 22.5 (L) 09/02/2020 0557   PLT 184 09/02/2020 0557   MCV 90.7 09/02/2020 0557   MCH 28.6 09/02/2020 0557   MCHC 31.6 09/02/2020 0557   RDW 19.0 (H) 09/02/2020 0557   LYMPHSABS 1.1 09/01/2020 1320   MONOABS 0.8 09/01/2020 1320   EOSABS 0.0 09/01/2020 1320   BASOSABS 0.1 09/01/2020 1320   Assessment/Plan: 1.  Oliguric, AKI/CKD stage IIIa- presumably due to ischemic ATN in setting of ABLA and hypotension requiring pressors for 2 days but  currently off of pressors.  No ACE/ARB or NSAIDs given. 1. Renal US without obstruction but possible blood in bladder, urine is dark but no gross hematuria 2. Renal dose medications 3. Continue to follow UOP and daily Scr 4. No indication for dialysis at this time and will need to follow closely 5. Will decrease IVF's and start IV lasix and follow UOP and Scr. 2. AMS- unclear etiology,  possibly due to meds.  Decrease gabapentin to 300 mg daily.  Consider CT of head and/or abdomen. 3. Metabolic acidosis- due to #1.  Cont to follow 4. ABLA- s/p transfusion but now with episode of hematemesis, will recheck CBC now and transfuse prn. 5. Severe protein malnutrition- albumin 1.1 and likely source of 3rd spacing.  Will give IV albumin prior to IV lasix. 6. Colonic mass- s/p left hemicolectomy 7. Hypotension- improved but would keep central line access given N/V and borderline bp 8. DM- per primary  Donetta Potts, MD [facility] [phone]

## 2020-09-02 NOTE — Progress Notes (Signed)
AT 1230hr RT Nasotracheal suctioned patient through left nare for moderate amount of dark brown/ green secretions, approximately 178ml. Patient began to vomit a large amount of the same color emesis. Patient remained stable throughout, RN notified and at bedside. RT will continue to monitor.

## 2020-09-02 NOTE — Progress Notes (Signed)
Blood administration was never documented as completed in flowsheets. This RN completed the flow sheet.

## 2020-09-02 NOTE — Progress Notes (Addendum)
3 Days Post-Op  Subjective: [name] was minimally interactive this morning and difficult to arouse. She occasionally responded to voice and other times would require painful stimuli to open her eyes. She slightly shook her head no when asked about abdominal pain. Minimal dark urine noted in foley bag at bedside. Loud, harsh expiratory gurgling noted during exam.  Objective: Vital signs in last 24 hours: Temp:  [97.3 F (36.3 C)-98.2 F (36.8 C)] 98.1 F (36.7 C) (09/03 0400) Pulse Rate:  [87-89] 89 (09/02 0800) Resp:  [17-26] 17 (09/03 0600) BP: (81-154)/(32-68) 117/42 (09/03 0600) SpO2:  [92 %-98 %] 92 % (09/02 2038) Weight:  [045.4 kg] 113.2 kg (09/03 0400) Last BM Date: 09/01/20  Intake/Output from previous day: 09/02 0701 - 09/03 0700 In: -  Out: 120 [Urine:120] Intake/Output this shift: No intake/output data recorded.  Physical Exam Vitals and nursing note reviewed.  Constitutional: no acute distress,lethargic and difficult to arouse Cardiovascular: regular rate and rhythm, normal heart sounds, difficult to auscultate given loud expiratory  Pulmonary: effort normal; loud, harsh expiratory gurgling radiating into all lung fields; normal clear inspiratory sounds throughout Abdominal: flat, mild TTP, NABS,incision site without erythema, purulence, or significant swelling Extremities: 1+ bilateral LE edema Skin: warm and dry  Lab Results:  Recent Labs    09/01/20 1320 09/02/20 0557  WBC 26.4* 31.0*  HGB 7.8* 7.1*  HCT 24.8* 22.5*  PLT 203 184   BMET Recent Labs    08/31/20 0458 09/01/20 0515  NA 142 140  K 3.9 4.6  CL 113* 111  CO2 19* 17*  GLUCOSE 100* 171*  BUN 18 28*  CREATININE 1.76* 2.55*  CALCIUM 6.9* 7.0*    Studies/Results: US RENAL  Result Date: 09/01/2020 CLINICAL DATA:  Acute renal insufficiency/chronic renal disease stage III B. EXAM: RENAL / URINARY TRACT ULTRASOUND COMPLETE COMPARISON:  None. FINDINGS: Right Kidney: Renal measurements:  8.6 cm x 4.5 cm x 5.0 cm = volume: 99.15 mL. Echogenicity within normal limits. 1.0 cm x 0.9 cm x 1.0 cm and 1.8 cm x 1.8 cm x 2.3 cm anechoic structures are seen within the mid and upper right kidney. No hydronephrosis visualized. Left Kidney: Renal measurements: 10.5 cm x 6.4 cm x 5.7 cm = volume: 201.2 mL. Echogenicity within normal limits. A 1.7 cm x 1.7 cm x 2.3 cm anechoic structure is seen within the mid left kidney. No hydronephrosis visualized. Bladder: A Foley catheter is within the urinary bladder. A 3.3 cm x 3.1 cm x 2.1 cm heterogeneous mildly echogenic focus is seen within the lumen of the urinary bladder. Other: None. IMPRESSION: 1. Findings which may represent a moderate amount of blood products within the urinary bladder. Additional evaluation with abdomen and pelvis CT is recommended. 2. Bilateral renal cysts. Electronically Signed   By: Virgina Norfolk M.D.   On: 09/01/2020 17:07    Anti-infectives: Anti-infectives (From admission, onward)   Start     Dose/Rate Route Frequency Ordered Stop   08/12/2020 0630  ertapenem (INVANZ) 1,000 mg in sodium chloride 0.9 % 100 mL IVPB        1 g 200 mL/hr over 30 Minutes Intravenous On call to O.R. 08/06/2020 0621 08/29/2020 1030      Assessment/Plan: s/p Procedure(s): Left Hemicolectomy CENTRAL LINE INSERTION [name] is a 79 year old african american female 3 days post-op from left hemicolectomy and central line insertion. Pt is lethargic today. Urine output improving slowly, sCr increased to 3.18 today. Nephrology following. Lower extremity edema and  CXR indicating excess fluid volume. Serum albumin low at 1.1. Lasix and albumin to improve intravascular volume and diurese extra fluid, per nephrology. Will continue to monitor I/O. CXR also indicative of possible right middle lobe infiltrate, concerning for aspiration event. With elevation in WBC to 31.0 from 26.4 yesterday and harsh expiratory ronchi will start pt on Zosyn. Hemodynamically  stable off levophed. Hct slightly reduced to 22.5 from 24.8, will continue to trend but likley due to fluid resuscitation. Lethargy may be due to developing aspiration pneumonia or fatigue from recent surgery. Will continue to monitor and provide tx as described above.  -NPO -maintain foley catheter due to renal failure -Strict I/O -albumin and lasix, per nephro -solumedrol 40 mg once today -D5 1/2 NS 50 mL/hr -fentanyl injection, 12.5 mcg q2h PRN -Heparin -Zosyn 3.375 q8h   LOS: 3 days    [name] 09/02/2020

## 2020-09-03 LAB — RENAL FUNCTION PANEL
Albumin: 1.6 g/dL — ABNORMAL LOW (ref 3.5–5.0)
Anion gap: 10 (ref 5–15)
BUN: 54 mg/dL — ABNORMAL HIGH (ref 8–23)
CO2: 20 mmol/L — ABNORMAL LOW (ref 22–32)
Calcium: 7 mg/dL — ABNORMAL LOW (ref 8.9–10.3)
Chloride: 112 mmol/L — ABNORMAL HIGH (ref 98–111)
Creatinine, Ser: 3.61 mg/dL — ABNORMAL HIGH (ref 0.44–1.00)
GFR calc Af Amer: 13 mL/min — ABNORMAL LOW (ref >60)
GFR calc non Af Amer: 12 mL/min — ABNORMAL LOW (ref >60)
Glucose, Bld: 82 mg/dL (ref 70–99)
Phosphorus: 3.7 mg/dL (ref 2.5–4.6)
Potassium: 3.8 mmol/L (ref 3.5–5.1)
Sodium: 142 mmol/L (ref 135–145)

## 2020-09-03 LAB — TYPE AND SCREEN
ABO/RH(D): B POS
Antibody Screen: NEGATIVE
Unit division: 0

## 2020-09-03 LAB — GLUCOSE, CAPILLARY
Glucose-Capillary: 80 mg/dL (ref 70–99)
Glucose-Capillary: 90 mg/dL (ref 70–99)
Glucose-Capillary: 70 mg/dL (ref 70–99)
Glucose-Capillary: 128 mg/dL — ABNORMAL HIGH (ref 70–99)

## 2020-09-03 LAB — CBC
HCT: 26.6 % — ABNORMAL LOW (ref 36.0–46.0)
HCT: 25.4 % — ABNORMAL LOW (ref 36.0–46.0)
Hemoglobin: 8.5 g/dL — ABNORMAL LOW (ref 12.0–15.0)
Hemoglobin: 8.7 g/dL — ABNORMAL LOW (ref 12.0–15.0)
MCH: 27.7 pg (ref 26.0–34.0)
MCH: 29.4 pg (ref 26.0–34.0)
MCHC: 32 g/dL (ref 30.0–36.0)
MCHC: 34.3 g/dL (ref 30.0–36.0)
MCV: 86.6 fL (ref 80.0–100.0)
MCV: 85.8 fL (ref 80.0–100.0)
Platelets: 157 10*3/uL (ref 150–400)
Platelets: 150 10*3/uL (ref 150–400)
RBC: 3.07 MIL/uL — ABNORMAL LOW (ref 3.87–5.11)
RBC: 2.96 MIL/uL — ABNORMAL LOW (ref 3.87–5.11)
RDW: 18.6 % — ABNORMAL HIGH (ref 11.5–15.5)
RDW: 18.7 % — ABNORMAL HIGH (ref 11.5–15.5)
WBC: 21.2 10*3/uL — ABNORMAL HIGH (ref 4.0–10.5)
WBC: 21.5 10*3/uL — ABNORMAL HIGH (ref 4.0–10.5)
nRBC: 0.3 % — ABNORMAL HIGH (ref 0.0–0.2)
nRBC: 0.2 % (ref 0.0–0.2)

## 2020-09-03 LAB — BPAM RBC
Blood Product Expiration Date: 202110012359
ISSUE DATE / TIME: 202109031125
Unit Type and Rh: 1700

## 2020-09-03 LAB — MAGNESIUM: Magnesium: 2.4 mg/dL (ref 1.7–2.4)

## 2020-09-03 MED ORDER — ROSUVASTATIN CALCIUM 10 MG PO TABS
10.0000 mg | ORAL_TABLET | Freq: Every day | ORAL | Status: DC
  Administered 2020-09-05 – 2020-09-06 (×2): 10 mg via ORAL
  Filled 2020-09-03 (×2): qty 1

## 2020-09-03 MED ORDER — PANTOPRAZOLE SODIUM 40 MG IV SOLR
40.0000 mg | INTRAVENOUS | Status: DC
  Administered 2020-09-03 – 2020-09-05 (×3): 40 mg via INTRAVENOUS
  Filled 2020-09-03 (×3): qty 40

## 2020-09-03 MED ORDER — DEXTROSE 50 % IV SOLN
INTRAVENOUS | Status: AC
  Administered 2020-09-03: 12.5 mL
  Filled 2020-09-03: qty 50

## 2020-09-03 MED ORDER — PIPERACILLIN-TAZOBACTAM 3.375 G IVPB
3.3750 g | Freq: Two times a day (BID) | INTRAVENOUS | Status: DC
  Administered 2020-09-03 – 2020-09-07 (×9): 3.375 g via INTRAVENOUS
  Filled 2020-09-03 (×9): qty 50

## 2020-09-03 NOTE — Progress Notes (Signed)
[facility] ROUNDING NOTE   Subjective:   Brief history: 79 year old lady hypertension hyperlipidemia diabetes COPD  , admitted 08/08/2020 for partial colectomy due to near obstruction of sigmoid colon.  Baseline serum creatinine actually appears to be within the normal range 1 mg/dL 08/16/2020 on admission her creatinine was 1.2 mg/dL.she developed oliguric acute kidney injury 60 to acute tubal necrosis in the setting of acute blood loss anemia hypotension requiring pressors.  She does not use nonsteroidal anti-inflammatory drugs ACE inhibitors or ARB's.  Renal ultrasound did not stray any evidence of obstruction.  Blood pressure 149/50 pulse 85 temperature 98.7 O2 sats 98% 2 L nasal cannula.  Urine output 100 cc 09/02/2020 and 800 cc 09/03/2020 with 450 cc emesis.  Weight increased 113.2 kg since admission weight  Sodium 142 potassium 3.8 CO2 20 chloride 112 BUN 54 creatinine 3.61 glucose 82 calcium 7 magnesium 2.4 up 1.6.  Hemoglobin 8.7  Celexa 10 mg daily, diltiazem120 mg daily, multivitamins 1 daily protonix 40 g daily Crestor 10 mg daily, Effexor 150 mg daily  D5/1/2 normal saline 50 cc an hour Zosyn 3.375 g every 8 hours Lasix 40 mg twice daily IV    Objective:  Vital signs in last 24 hours:  Temp:  [97.9 F (36.6 C)-98.7 F (37.1 C)] 98.7 F (37.1 C) (09/04 1140) Pulse Rate:  [80-86] 84 (09/04 1100) Resp:  [14-22] 16 (09/04 1100) BP: (101-162)/(36-74) 142/47 (09/04 1100) SpO2:  [96 %-100 %] 100 % (09/04 1100)  Weight change:  Filed Weights   08/20/2020 0947 09/01/20 0407 09/02/20 0400  Weight: 103.9 kg 111.7 kg 113.2 kg    Intake/Output: I/O last 3 completed shifts: In: 2695.5 [I.V.:1032.7; Blood:1479; IV Piggyback:183.8] Out: 1350 [Urine:900; Emesis/NG output:450]   Intake/Output this shift:  No intake/output data recorded.  Gen: obese, lethargic and nonverbal, minimally responsive CVS: RRR no rub Resp: scattered transmitted upper airway sounds/girgling   Abd: obese, hypoactive bowel sounds, nontender, vertical incision c/d/i Ext: 1+ edema   Basic Metabolic Panel: Recent Labs  Lab 08/11/2020 0638 08/02/2020 8366 08/31/20 0458 08/31/20 0458 09/01/20 0515 09/02/20 0557 09/03/20 0602  NA 139  --  142  --  140 138 142  K 3.5  --  3.9  --  4.6 4.6 3.8  CL 107  --  113*  --  111 111 112*  CO2 22  --  19*  --  17* 19* 20*  GLUCOSE 99  --  100*  --  171* 338* 82  BUN 19  --  18  --  28* 41* 54*  CREATININE 1.15*  --  1.76*  --  2.55* 3.18* 3.61*  CALCIUM 8.0*   < > 6.9*   < > 7.0* 6.6* 7.0*  MG  --   --  1.4*  --  2.2 2.4 2.4  PHOS  --   --  4.9*  --  4.9* 4.2 3.7   < > = values in this interval not displayed.    Liver Function Tests: Recent Labs  Lab 09/02/20 0557 09/03/20 0602  ALBUMIN 1.1* 1.6*   No results for input(s): LIPASE, AMYLASE in the last 168 hours. No results for input(s): AMMONIA in the last 168 hours.  CBC: Recent Labs  Lab 08/09/2020 0638 08/25/2020 1555 09/01/20 0515 09/01/20 1320 09/02/20 0557 09/03/20 0006 09/03/20 0602  WBC 12.2*   < > 25.3* 26.4* 31.0* 21.2* 21.5*  NEUTROABS 8.8*  --   --  23.8*  --   --   --  HGB 7.8*   < > 8.0* 7.8* 7.1* 8.5* 8.7*  HCT 25.1*   < > 24.9* 24.8* 22.5* 26.6* 25.4*  MCV 90.3   < > 90.9 91.5 90.7 86.6 85.8  PLT 273   < > 213 203 184 157 150   < > = values in this interval not displayed.    Cardiac Enzymes: Recent Labs  Lab 09/01/20 1320  CKTOTAL 115    BNP: Invalid input(s): POCBNP  CBG: Recent Labs  Lab 09/02/20 1145 09/02/20 1615 09/02/20 2053 09/03/20 0749 09/03/20 1137  GLUCAP 330* 269* 156* 80 90    Microbiology: Results for orders placed or performed during the hospital encounter of 08/17/2020  SARS Coronavirus 2 by RT PCR (hospital order, performed in [facility] hospital lab) Nasopharyngeal Nasopharyngeal Swab     Status: None   Collection Time: 08/26/2020  6:38 AM   Specimen: Nasopharyngeal Swab  Result Value Ref Range Status   SARS  Coronavirus 2 NEGATIVE NEGATIVE Final    Comment: (NOTE) SARS-CoV-2 target nucleic acids are NOT DETECTED.  The SARS-CoV-2 RNA is generally detectable in upper and lower respiratory specimens during the acute phase of infection. The lowest concentration of SARS-CoV-2 viral copies this assay can detect is 250 copies / mL. A negative result does not preclude SARS-CoV-2 infection and should not be used as the sole basis for treatment or other patient management decisions.  A negative result may occur with improper specimen collection / handling, submission of specimen other than nasopharyngeal swab, presence of viral mutation(s) within the areas targeted by this assay, and inadequate number of viral copies (<250 copies / mL). A negative result must be combined with clinical observations, patient history, and epidemiological information.  Fact Sheet for Patients:   StrictlyIdeas.no  Fact Sheet for Healthcare Providers: BankingDealers.co.za  This test is not yet approved or  cleared by the [place] FDA and has been authorized for detection and/or diagnosis of SARS-CoV-2 by FDA under an Emergency Use Authorization (EUA).  This EUA will remain in effect (meaning this test can be used) for the duration of the COVID-19 declaration under Section 564(b)(1) of the Act, 21 U.S.C. section 360bbb-3(b)(1), unless the authorization is terminated or revoked sooner.  Performed at [facility], [address], [place], [place] [address]   MRSA PCR Screening     Status: None   Collection Time: 08/29/2020  8:13 PM   Specimen: Nasal Mucosa; Nasopharyngeal  Result Value Ref Range Status   MRSA by PCR NEGATIVE NEGATIVE Final    Comment:        The GeneXpert MRSA Assay (FDA approved for NASAL specimens only), is one component of a comprehensive MRSA colonization surveillance program. It is not intended to diagnose MRSA infection nor to guide  or monitor treatment for MRSA infections. Performed at [facility], [address], [place], [place] [address]    *Note: Due to a large number of results and/or encounters for the requested time period, some results have not been displayed. A complete set of results can be found in Results Review.    Coagulation Studies: No results for input(s): LABPROT, INR in the last 72 hours.  Urinalysis: Recent Labs    09/01/20 1252  COLORURINE YELLOW  LABSPEC 1.015  PHURINE 5.0  GLUCOSEU NEGATIVE  HGBUR LARGE*  BILIRUBINUR NEGATIVE  KETONESUR NEGATIVE  PROTEINUR 30*  NITRITE NEGATIVE  LEUKOCYTESUR NEGATIVE      Imaging: US RENAL  Result Date: 09/01/2020 CLINICAL DATA:  Acute renal insufficiency/chronic  renal disease stage III B. EXAM: RENAL / URINARY TRACT ULTRASOUND COMPLETE COMPARISON:  None. FINDINGS: Right Kidney: Renal measurements: 8.6 cm x 4.5 cm x 5.0 cm = volume: 99.15 mL. Echogenicity within normal limits. 1.0 cm x 0.9 cm x 1.0 cm and 1.8 cm x 1.8 cm x 2.3 cm anechoic structures are seen within the mid and upper right kidney. No hydronephrosis visualized. Left Kidney: Renal measurements: 10.5 cm x 6.4 cm x 5.7 cm = volume: 201.2 mL. Echogenicity within normal limits. A 1.7 cm x 1.7 cm x 2.3 cm anechoic structure is seen within the mid left kidney. No hydronephrosis visualized. Bladder: A Foley catheter is within the urinary bladder. A 3.3 cm x 3.1 cm x 2.1 cm heterogeneous mildly echogenic focus is seen within the lumen of the urinary bladder. Other: None. IMPRESSION: 1. Findings which may represent a moderate amount of blood products within the urinary bladder. Additional evaluation with abdomen and pelvis CT is recommended. 2. Bilateral renal cysts. Electronically Signed   By: Virgina Norfolk M.D.   On: 09/01/2020 17:07   DG Chest Port 1 View  Result Date: 09/02/2020 CLINICAL DATA:  Increased shortness of breath. History of pneumonia. EXAM: PORTABLE CHEST 1 VIEW COMPARISON:   08/29/2020. FINDINGS: Interim removal of endotracheal tube. Left subclavian line stable position. Small catheter noted over the left neck. Stable cardiomegaly. Progressive bibasilar atelectasis/infiltrates. No pleural effusion. No pneumothorax. IMPRESSION: 1. Interim removal endotracheal tube. Left subclavian line in stable position. Small catheter noted over the left neck. 2.  Stable cardiomegaly. 3.  Progressive bibasilar atelectasis/infiltrates.  No pneumothorax. Electronically Signed   By: Marcello Moores  Register   On: 09/02/2020 07:43   DG Chest Port 1V same Day  Result Date: 09/02/2020 CLINICAL DATA:  NG placement EXAM: PORTABLE CHEST 1 VIEW COMPARISON:  09/02/2020 FINDINGS: NG tube in the right lower lobe bronchus has been removed. NG tube is now in the stomach with the tip in the body of the stomach. Left subclavian central venous catheter tip in the SVC unchanged Hypoventilation. Progression of right lower lobe atelectasis/infiltrate. Mild left lower lobe airspace disease unchanged. Small right effusion. IMPRESSION: NG tube in the stomach with the tip in the body of the stomach Progression of right lower lobe airspace disease. Electronically Signed   By: Franchot Gallo M.D.   On: 09/02/2020 13:52   DG Chest Port 1V same Day  Result Date: 09/02/2020 CLINICAL DATA:  NG placement EXAM: PORTABLE CHEST 1 VIEW COMPARISON:  09/02/2020 FINDINGS: Interval placement of NG tube. The NG tube is in the right lower bronchus with the side hole in the distal trachea. There is increased right lower lobe atelectasis. Mild left lower lobe atelectasis No heart failure or effusion.  No pneumothorax Left subclavian central venous catheter tip in the proximal SVC is unchanged. IMPRESSION: NG tube in the right lower lobe bronchus.  Recommend removal. These results were called by telephone at the time of interpretation on 09/02/2020 at 1:17 pm to provider [facility], who verbally acknowledged these results. Electronically Signed   By:  Franchot Gallo M.D.   On: 09/02/2020 13:17   ECHOCARDIOGRAM COMPLETE  Result Date: 09/02/2020    ECHOCARDIOGRAM REPORT   Patient Name:   JHANIA ETHERINGTON Date of Exam: 09/02/2020 Medical Rec #:  [id number]        Height:       72.0 in Accession #:    [id number]       Weight:       249.6  lb Date of Birth:  1941-11-04        BSA:          2.341 m Patient Age:    63 years         BP:           103/71 mmHg Patient Gender: F                HR:           85 bpm. Exam Location:  [facility] Procedure: 2D Echo Indications:    Congestive Heart Failure 428.0 / I50.9  History:        Patient has prior history of Echocardiogram examinations, most                 recent 03/22/2020. COPD; Risk Factors:Former Smoker,                 Hypertension, Dyslipidemia and Diabetes. Colonic mass.  Sonographer:    Leavy Cella RDCS (AE) Referring Phys: [id number] [place]  1. Left ventricular ejection fraction, by estimation, is 55 to 60%. The left ventricle has normal function. The left ventricle has no regional wall motion abnormalities. There is mild left ventricular hypertrophy. Left ventricular diastolic parameters are consistent with Grade I diastolic dysfunction (impaired relaxation).  2. Right ventricular systolic function is normal. The right ventricular size is normal. There is mildly elevated pulmonary artery systolic pressure.  3. The mitral valve is normal in structure. Mild mitral valve regurgitation. No evidence of mitral stenosis.  4. The aortic valve is tricuspid. Aortic valve regurgitation is not visualized. No aortic stenosis is present. FINDINGS  Left Ventricle: Left ventricular ejection fraction, by estimation, is 55 to 60%. The left ventricle has normal function. The left ventricle has no regional wall motion abnormalities. The left ventricular internal cavity size was normal in size. There is  mild left ventricular hypertrophy. Left ventricular diastolic parameters are consistent with Grade I diastolic  dysfunction (impaired relaxation). Normal left ventricular filling pressure. Right Ventricle: The right ventricular size is normal. No increase in right ventricular wall thickness. Right ventricular systolic function is normal. There is mildly elevated pulmonary artery systolic pressure. The tricuspid regurgitant velocity is 2.85  m/s, and with an assumed right atrial pressure of 10 mmHg, the estimated right ventricular systolic pressure is 08.6 mmHg. Left Atrium: Left atrial size was normal in size. Right Atrium: Right atrial size was normal in size. Pericardium: There is no evidence of pericardial effusion. Mitral Valve: The mitral valve is normal in structure. There is mild thickening of the mitral valve leaflet(s). There is mild calcification of the mitral valve leaflet(s). Mild mitral annular calcification. Mild mitral valve regurgitation. No evidence of  mitral valve stenosis. Tricuspid Valve: The tricuspid valve is normal in structure. Tricuspid valve regurgitation is not demonstrated. No evidence of tricuspid stenosis. Aortic Valve: The aortic valve is tricuspid. Aortic valve regurgitation is not visualized. No aortic stenosis is present. Aortic valve mean gradient measures 5.5 mmHg. Aortic valve peak gradient measures 13.3 mmHg. Aortic valve area, by VTI measures 2.39  cm. Pulmonic Valve: The pulmonic valve was not well visualized. Pulmonic valve regurgitation is not visualized. No evidence of pulmonic stenosis. Aorta: The aortic root is normal in size and structure. Pulmonary Artery: Indeterminant PASP, inadequate TR jet. Venous: The inferior vena cava was not well visualized. IAS/Shunts: The interatrial septum was not well visualized.  LEFT VENTRICLE PLAX 2D LVIDd:  3.78 cm  Diastology LVIDs:         2.79 cm  LV e' lateral:   7.83 cm/s LV PW:         1.24 cm  LV E/e' lateral: 8.6 LV IVS:        1.23 cm  LV e' medial:    5.55 cm/s LVOT diam:     2.00 cm  LV E/e' medial:  12.1 LV SV:         82 LV  SV Index:   35 LVOT Area:     3.14 cm  RIGHT VENTRICLE RV S prime:     19.30 cm/s TAPSE (M-mode): 2.6 cm LEFT ATRIUM             Index       RIGHT ATRIUM           Index LA diam:        3.50 cm 1.50 cm/m  RA Area:     10.40 cm LA Vol (A2C):   45.7 ml 19.52 ml/m RA Volume:   22.50 ml  9.61 ml/m LA Vol (A4C):   42.2 ml 18.03 ml/m LA Biplane Vol: 48.4 ml 20.68 ml/m  AORTIC VALVE AV Area (Vmax):    2.12 cm AV Area (Vmean):   2.04 cm AV Area (VTI):     2.39 cm AV Vmax:           182.19 cm/s AV Vmean:          106.438 cm/s AV VTI:            0.345 m AV Peak Grad:      13.3 mmHg AV Mean Grad:      5.5 mmHg LVOT Vmax:         123.07 cm/s LVOT Vmean:        68.982 cm/s LVOT VTI:          0.262 m LVOT/AV VTI ratio: 0.76  AORTA Ao Root diam: 2.80 cm MITRAL VALVE                TRICUSPID VALVE MV Area (PHT): 3.68 cm     TR Peak grad:   32.5 mmHg MV Decel Time: 206 msec     TR Vmax:        285.00 cm/s MV E velocity: 67.30 cm/s MV A velocity: 147.00 cm/s  SHUNTS MV E/A ratio:  0.46         Systemic VTI:  0.26 m                             Systemic Diam: 2.00 cm Carlyle Dolly MD Electronically signed by Carlyle Dolly MD Signature Date/Time: 09/02/2020/3:46:15 PM    Final      Medications:   . dextrose 5 % and 0.45% NaCl 50 mL/hr at 09/03/20 0643  . norepinephrine (LEVOPHED) Adult infusion 1 mcg/min (08/31/20 1149)  . piperacillin-tazobactam (ZOSYN)  IV     . brimonidine  1 drop Both Eyes Q12H  . Chlorhexidine Gluconate Cloth  6 each Topical Daily  . citalopram  10 mg Oral Daily  . cycloSPORINE  1 drop Both Eyes BID  . diltiazem  120 mg Oral Daily  . ezetimibe  10 mg Oral Daily  . fluticasone  2 spray Each Nare Daily  . furosemide  40 mg Intravenous BID  . gabapentin  300 mg Oral QHS  . heparin injection (subcutaneous)  5,000 Units  Subcutaneous Q8H  . insulin aspart  0-20 Units Subcutaneous TID WC  . methylPREDNISolone (SOLU-MEDROL) injection  40 mg Intravenous Q24H  . multivitamin with minerals   1 tablet Oral QHS  . pantoprazole  40 mg Oral Q1200  . rosuvastatin  10 mg Oral QHS  . sodium chloride flush  10-40 mL Intracatheter Q12H  . timolol  1 drop Both Eyes BID  . venlafaxine XR  150 mg Oral Daily   acetaminophen **OR** acetaminophen, albuterol, diphenhydrAMINE **OR** diphenhydrAMINE, fentaNYL (SUBLIMAZE) injection, LORazepam, ondansetron **OR** ondansetron (ZOFRAN) IV, simethicone, sodium chloride flush, traMADol  Assessment/ Plan:   Acute renal failure.  Appears oliguric.  Agree and favored the diagnosis of acute tubulous necrosis in the setting of hypotension and acute blood loss anemia.  No evidence of obstruction on renal ultrasound.  We will continue to follow renal function.  Hypotension/volume.  We will continue to follow.  Blood pressure marginal.  May need pressor support.  We will continue diuresis for now  Protein/calorie malnutrition.  Patient receiving both IV albumin and Lasixin order to try to promote mobilization and diuresis  Metabolic acidosis appears mild will continue to follow.  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

## 2020-09-03 NOTE — Progress Notes (Signed)
Pt has had 3 episodes of non-sustained VT throughout the shift. Pt asymptomatic. Dr. Constance Haw made aware. Stated to monitor the patient and make aware if events become more frequent. Will continue to monitor.

## 2020-09-03 NOTE — Progress Notes (Signed)
[facility] Progress Note  4 Days Post-Op  Subjective: Doing better. ECHO with normal EF yesterday. She is starting to make urine with lasix, Cr still has not peaked. Yesterday RT did some NTS and we placed NG given some vomiting she had in afternoon. Not a ton out put it is dark.   Updated [name] Daughter regarding improvement in O2 today and more awake and following commands compared to yesterday afternoon.     Objective: Vital signs in last 24 hours: Temp:  [97.9 F (36.6 C)-98.7 F (37.1 C)] 98.7 F (37.1 C) (09/04 1140) Pulse Rate:  [80-86] 85 (09/04 1200) Resp:  [14-22] 16 (09/04 1200) BP: (101-162)/(36-74) 149/50 (09/04 1200) SpO2:  [96 %-100 %] 99 % (09/04 1200) Last BM Date: 09/01/20  Intake/Output from previous day: 09/03 0701 - 09/04 0700 In: 2695.5 [I.V.:1032.7; Blood:1479; IV Piggyback:183.8] Out: 1250 [Urine:800; Emesis/NG output:450] Intake/Output this shift: No intake/output data recorded.  General appearance: no distress and resting with eyes closed but follows verbal commands Resp: no increased work of breathing, improved upper respiratory sounds GI: soft, nondistended, appropriately tender, honeycomb with betadine staining  Lab Results:  Recent Labs    09/03/20 0006 09/03/20 0602  WBC 21.2* 21.5*  HGB 8.5* 8.7*  HCT 26.6* 25.4*  PLT 157 150   BMET Recent Labs    09/02/20 0557 09/03/20 0602  NA 138 142  K 4.6 3.8  CL 111 112*  CO2 19* 20*  GLUCOSE 338* 82  BUN 41* 54*  CREATININE 3.18* 3.61*  CALCIUM 6.6* 7.0*   PT/INR No results for input(s): LABPROT, INR in the last 72 hours.  Studies/Results: US RENAL  Result Date: 09/01/2020 CLINICAL DATA:  Acute renal insufficiency/chronic renal disease stage III B. EXAM: RENAL / URINARY TRACT ULTRASOUND COMPLETE COMPARISON:  None. FINDINGS: Right Kidney: Renal measurements: 8.6 cm x 4.5 cm x 5.0 cm = volume: 99.15 mL. Echogenicity within normal limits. 1.0 cm x 0.9 cm x 1.0 cm  and 1.8 cm x 1.8 cm x 2.3 cm anechoic structures are seen within the mid and upper right kidney. No hydronephrosis visualized. Left Kidney: Renal measurements: 10.5 cm x 6.4 cm x 5.7 cm = volume: 201.2 mL. Echogenicity within normal limits. A 1.7 cm x 1.7 cm x 2.3 cm anechoic structure is seen within the mid left kidney. No hydronephrosis visualized. Bladder: A Foley catheter is within the urinary bladder. A 3.3 cm x 3.1 cm x 2.1 cm heterogeneous mildly echogenic focus is seen within the lumen of the urinary bladder. Other: None. IMPRESSION: 1. Findings which may represent a moderate amount of blood products within the urinary bladder. Additional evaluation with abdomen and pelvis CT is recommended. 2. Bilateral renal cysts. Electronically Signed   By: Virgina Norfolk M.D.   On: 09/01/2020 17:07   DG Chest Port 1 View  Result Date: 09/02/2020 CLINICAL DATA:  Increased shortness of breath. History of pneumonia. EXAM: PORTABLE CHEST 1 VIEW COMPARISON:  08/19/2020. FINDINGS: Interim removal of endotracheal tube. Left subclavian line stable position. Small catheter noted over the left neck. Stable cardiomegaly. Progressive bibasilar atelectasis/infiltrates. No pleural effusion. No pneumothorax. IMPRESSION: 1. Interim removal endotracheal tube. Left subclavian line in stable position. Small catheter noted over the left neck. 2.  Stable cardiomegaly. 3.  Progressive bibasilar atelectasis/infiltrates.  No pneumothorax. Electronically Signed   By: Marcello Moores  Register   On: 09/02/2020 07:43   DG Chest Port 1V same Day  Result Date: 09/02/2020 CLINICAL DATA:  NG placement EXAM: PORTABLE CHEST  1 VIEW COMPARISON:  09/02/2020 FINDINGS: NG tube in the right lower lobe bronchus has been removed. NG tube is now in the stomach with the tip in the body of the stomach. Left subclavian central venous catheter tip in the SVC unchanged Hypoventilation. Progression of right lower lobe atelectasis/infiltrate. Mild left lower lobe  airspace disease unchanged. Small right effusion. IMPRESSION: NG tube in the stomach with the tip in the body of the stomach Progression of right lower lobe airspace disease. Electronically Signed   By: Franchot Gallo M.D.   On: 09/02/2020 13:52   DG Chest Port 1V same Day  Result Date: 09/02/2020 CLINICAL DATA:  NG placement EXAM: PORTABLE CHEST 1 VIEW COMPARISON:  09/02/2020 FINDINGS: Interval placement of NG tube. The NG tube is in the right lower bronchus with the side hole in the distal trachea. There is increased right lower lobe atelectasis. Mild left lower lobe atelectasis No heart failure or effusion.  No pneumothorax Left subclavian central venous catheter tip in the proximal SVC is unchanged. IMPRESSION: NG tube in the right lower lobe bronchus.  Recommend removal. These results were called by telephone at the time of interpretation on 09/02/2020 at 1:17 pm to provider [facility], who verbally acknowledged these results. Electronically Signed   By: Franchot Gallo M.D.   On: 09/02/2020 13:17   ECHOCARDIOGRAM COMPLETE  Result Date: 09/02/2020    ECHOCARDIOGRAM REPORT   Patient Name:   ROMEY COHEA Date of Exam: 09/02/2020 Medical Rec #:  [id number]        Height:       72.0 in Accession #:    [id number]       Weight:       249.6 lb Date of Birth:  1941/05/24        BSA:          2.341 m Patient Age:    79 years         BP:           103/71 mmHg Patient Gender: F                HR:           85 bpm. Exam Location:  [facility] Procedure: 2D Echo Indications:    Congestive Heart Failure 428.0 / I50.9  History:        Patient has prior history of Echocardiogram examinations, most                 recent 03/22/2020. COPD; Risk Factors:Former Smoker,                 Hypertension, Dyslipidemia and Diabetes. Colonic mass.  Sonographer:    Leavy Cella RDCS (AE) Referring Phys: [id number] [place]  1. Left ventricular ejection fraction, by estimation, is 55 to 60%. The left ventricle has normal function.  The left ventricle has no regional wall motion abnormalities. There is mild left ventricular hypertrophy. Left ventricular diastolic parameters are consistent with Grade I diastolic dysfunction (impaired relaxation).  2. Right ventricular systolic function is normal. The right ventricular size is normal. There is mildly elevated pulmonary artery systolic pressure.  3. The mitral valve is normal in structure. Mild mitral valve regurgitation. No evidence of mitral stenosis.  4. The aortic valve is tricuspid. Aortic valve regurgitation is not visualized. No aortic stenosis is present. FINDINGS  Left Ventricle: Left ventricular ejection fraction, by estimation, is 55 to 60%. The left ventricle has normal function. The left ventricle  has no regional wall motion abnormalities. The left ventricular internal cavity size was normal in size. There is  mild left ventricular hypertrophy. Left ventricular diastolic parameters are consistent with Grade I diastolic dysfunction (impaired relaxation). Normal left ventricular filling pressure. Right Ventricle: The right ventricular size is normal. No increase in right ventricular wall thickness. Right ventricular systolic function is normal. There is mildly elevated pulmonary artery systolic pressure. The tricuspid regurgitant velocity is 2.85  m/s, and with an assumed right atrial pressure of 10 mmHg, the estimated right ventricular systolic pressure is 67.6 mmHg. Left Atrium: Left atrial size was normal in size. Right Atrium: Right atrial size was normal in size. Pericardium: There is no evidence of pericardial effusion. Mitral Valve: The mitral valve is normal in structure. There is mild thickening of the mitral valve leaflet(s). There is mild calcification of the mitral valve leaflet(s). Mild mitral annular calcification. Mild mitral valve regurgitation. No evidence of  mitral valve stenosis. Tricuspid Valve: The tricuspid valve is normal in structure. Tricuspid valve  regurgitation is not demonstrated. No evidence of tricuspid stenosis. Aortic Valve: The aortic valve is tricuspid. Aortic valve regurgitation is not visualized. No aortic stenosis is present. Aortic valve mean gradient measures 5.5 mmHg. Aortic valve peak gradient measures 13.3 mmHg. Aortic valve area, by VTI measures 2.39  cm. Pulmonic Valve: The pulmonic valve was not well visualized. Pulmonic valve regurgitation is not visualized. No evidence of pulmonic stenosis. Aorta: The aortic root is normal in size and structure. Pulmonary Artery: Indeterminant PASP, inadequate TR jet. Venous: The inferior vena cava was not well visualized. IAS/Shunts: The interatrial septum was not well visualized.  LEFT VENTRICLE PLAX 2D LVIDd:         3.78 cm  Diastology LVIDs:         2.79 cm  LV e' lateral:   7.83 cm/s LV PW:         1.24 cm  LV E/e' lateral: 8.6 LV IVS:        1.23 cm  LV e' medial:    5.55 cm/s LVOT diam:     2.00 cm  LV E/e' medial:  12.1 LV SV:         82 LV SV Index:   35 LVOT Area:     3.14 cm  RIGHT VENTRICLE RV S prime:     19.30 cm/s TAPSE (M-mode): 2.6 cm LEFT ATRIUM             Index       RIGHT ATRIUM           Index LA diam:        3.50 cm 1.50 cm/m  RA Area:     10.40 cm LA Vol (A2C):   45.7 ml 19.52 ml/m RA Volume:   22.50 ml  9.61 ml/m LA Vol (A4C):   42.2 ml 18.03 ml/m LA Biplane Vol: 48.4 ml 20.68 ml/m  AORTIC VALVE AV Area (Vmax):    2.12 cm AV Area (Vmean):   2.04 cm AV Area (VTI):     2.39 cm AV Vmax:           182.19 cm/s AV Vmean:          106.438 cm/s AV VTI:            0.345 m AV Peak Grad:      13.3 mmHg AV Mean Grad:      5.5 mmHg LVOT Vmax:         123.07  cm/s LVOT Vmean:        68.982 cm/s LVOT VTI:          0.262 m LVOT/AV VTI ratio: 0.76  AORTA Ao Root diam: 2.80 cm MITRAL VALVE                TRICUSPID VALVE MV Area (PHT): 3.68 cm     TR Peak grad:   32.5 mmHg MV Decel Time: 206 msec     TR Vmax:        285.00 cm/s MV E velocity: 67.30 cm/s MV A velocity: 147.00 cm/s  SHUNTS  MV E/A ratio:  0.46         Systemic VTI:  0.26 m                             Systemic Diam: 2.00 cm Carlyle Dolly MD Electronically signed by Carlyle Dolly MD Signature Date/Time: 09/02/2020/3:46:15 PM    Final     Anti-infectives: Anti-infectives (From admission, onward)   Start     Dose/Rate Route Frequency Ordered Stop   09/03/20 2200  piperacillin-tazobactam (ZOSYN) IVPB 3.375 g        3.375 g 12.5 mL/hr over 240 Minutes Intravenous Every 12 hours 09/03/20 1057     09/02/20 1800  piperacillin-tazobactam (ZOSYN) IVPB 3.375 g  Status:  Discontinued        3.375 g 12.5 mL/hr over 240 Minutes Intravenous Every 8 hours 09/02/20 1103 09/03/20 1057   09/02/20 0945  piperacillin-tazobactam (ZOSYN) IVPB 3.375 g        3.375 g 100 mL/hr over 30 Minutes Intravenous  Once 09/02/20 0930 09/02/20 1028   08/14/2020 0630  ertapenem (INVANZ) 1,000 mg in sodium chloride 0.9 % 100 mL IVPB        1 g 200 mL/hr over 30 Minutes Intravenous On call to O.R. 08/14/2020 0621 08/17/2020 1030      Assessment/Plan: Ms. 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 No malignancy. Improved O2 this AM and reduced requirements, more awake. ECHO reassuring.  PRN fentanyl for pain 2LNC, Turn/ cough/ chest physiotherapy/ NTS per RT HD ok, off pressors NPO, NG Protonix changed to IV given darkness of NG contents Labs in AM, UOP up with lasix 40 BID, appreciate nephrology seeing  Zosyn for possible PNA Solumedrol 40 daily due to prior steroid use IVF @ 52 for now SCDs, heparin sq     LOS: 4 days    Virl Cagey 09/03/2020

## 2020-09-04 LAB — GLUCOSE, CAPILLARY
Glucose-Capillary: 142 mg/dL — ABNORMAL HIGH (ref 70–99)
Glucose-Capillary: 155 mg/dL — ABNORMAL HIGH (ref 70–99)
Glucose-Capillary: 120 mg/dL — ABNORMAL HIGH (ref 70–99)
Glucose-Capillary: 131 mg/dL — ABNORMAL HIGH (ref 70–99)

## 2020-09-04 LAB — RENAL FUNCTION PANEL
Albumin: 1.5 g/dL — ABNORMAL LOW (ref 3.5–5.0)
Anion gap: 11 (ref 5–15)
BUN: 62 mg/dL — ABNORMAL HIGH (ref 8–23)
CO2: 20 mmol/L — ABNORMAL LOW (ref 22–32)
Calcium: 7.4 mg/dL — ABNORMAL LOW (ref 8.9–10.3)
Chloride: 108 mmol/L (ref 98–111)
Creatinine, Ser: 4.02 mg/dL — ABNORMAL HIGH (ref 0.44–1.00)
GFR calc Af Amer: 12 mL/min — ABNORMAL LOW (ref >60)
GFR calc non Af Amer: 10 mL/min — ABNORMAL LOW (ref >60)
Glucose, Bld: 157 mg/dL — ABNORMAL HIGH (ref 70–99)
Phosphorus: 4.9 mg/dL — ABNORMAL HIGH (ref 2.5–4.6)
Potassium: 3.6 mmol/L (ref 3.5–5.1)
Sodium: 139 mmol/L (ref 135–145)

## 2020-09-04 LAB — CBC WITH DIFFERENTIAL/PLATELET
Abs Immature Granulocytes: 0.11 10*3/uL — ABNORMAL HIGH (ref 0.00–0.07)
Basophils Absolute: 0 10*3/uL (ref 0.0–0.1)
Basophils Relative: 0 %
Eosinophils Absolute: 0 10*3/uL (ref 0.0–0.5)
Eosinophils Relative: 0 %
HCT: 25.6 % — ABNORMAL LOW (ref 36.0–46.0)
Hemoglobin: 8.6 g/dL — ABNORMAL LOW (ref 12.0–15.0)
Immature Granulocytes: 1 %
Lymphocytes Relative: 3 %
Lymphs Abs: 0.5 10*3/uL — ABNORMAL LOW (ref 0.7–4.0)
MCH: 28.9 pg (ref 26.0–34.0)
MCHC: 33.6 g/dL (ref 30.0–36.0)
MCV: 85.9 fL (ref 80.0–100.0)
Monocytes Absolute: 0.3 10*3/uL (ref 0.1–1.0)
Monocytes Relative: 2 %
Neutro Abs: 15.7 10*3/uL — ABNORMAL HIGH (ref 1.7–7.7)
Neutrophils Relative %: 94 %
Platelets: 133 10*3/uL — ABNORMAL LOW (ref 150–400)
RBC: 2.98 MIL/uL — ABNORMAL LOW (ref 3.87–5.11)
RDW: 18.4 % — ABNORMAL HIGH (ref 11.5–15.5)
WBC: 16.6 10*3/uL — ABNORMAL HIGH (ref 4.0–10.5)
nRBC: 0.2 % (ref 0.0–0.2)

## 2020-09-04 NOTE — Progress Notes (Signed)
[facility] ROUNDING NOTE   Subjective:   Brief history: 79 year old lady hypertension hyperlipidemia diabetes COPD  , admitted 08/26/2020 for partial colectomy due to near obstruction of sigmoid colon.  Baseline serum creatinine actually appears to be within the normal range 1 mg/dL 08/16/2020 on admission her creatinine was 1.2 mg/dL.she developed oliguric acute kidney injury 60 to acute tubal necrosis in the setting of acute blood loss anemia hypotension requiring pressors.  She does not use nonsteroidal anti-inflammatory drugs ACE inhibitors or ARB's.  Renal ultrasound did not stray any evidence of obstruction.  Blood pressure 180/70 pulse 84 temperature 98.1 O2 sats 100% room air.  Urine output 2400 cc 09/03/2020  Sodium 139 potassium 3.6 chloride 108 CO2 20 BUN 62 creatinine 4.0 glucose 157 calcium 7.4 phosphorus 4.9 albumin 1.5 hemoglobin 8.6  Celexa 10 mg daily, diltiazem120 mg daily, multivitamins 1 daily protonix 40 g daily Crestor 10 mg daily, Effexor 150 mg daily  D5/1/2 normal saline 50 cc an hour Zosyn 3.375 g every 8 hours Lasix 40 mg twice daily IV    Objective:  Vital signs in last 24 hours:  Temp:  [98 F (36.7 C)-98.7 F (37.1 C)] 98.1 F (36.7 C) (09/05 0734) Pulse Rate:  [82-95] 86 (09/05 0800) Resp:  [14-20] 17 (09/05 0800) BP: (134-193)/(45-88) 175/65 (09/05 0800) SpO2:  [94 %-100 %] 100 % (09/05 0800) Weight:  [106 kg] 106 kg (09/05 0500)  Weight change:  Filed Weights   09/01/20 0407 09/02/20 0400 09/04/20 0500  Weight: 111.7 kg 113.2 kg 106 kg    Intake/Output: I/O last 3 completed shifts: In: 2338.8 [I.V.:1074.8; XBLTJ:0300; IV Piggyback:100] Out: 9233 [Urine:3725; Emesis/NG output:850]   Intake/Output this shift:  Total I/O In: 100 [I.V.:100] Out: -   Gen: obese, lethargic and nonverbal, minimally responsive CVS: RRR no rub Resp: scattered transmitted upper airway sounds/girgling  Abd: obese, hypoactive bowel sounds, nontender,  vertical incision c/d/i Ext: 1+ edema   Basic Metabolic Panel: Recent Labs  Lab 08/31/20 0458 08/31/20 0458 09/01/20 0515 09/01/20 0515 09/02/20 0557 09/03/20 0602 09/04/20 0552  NA 142  --  140  --  138 142 139  K 3.9  --  4.6  --  4.6 3.8 3.6  CL 113*  --  111  --  111 112* 108  CO2 19*  --  17*  --  19* 20* 20*  GLUCOSE 100*  --  171*  --  338* 82 157*  BUN 18  --  28*  --  41* 54* 62*  CREATININE 1.76*  --  2.55*  --  3.18* 3.61* 4.02*  CALCIUM 6.9*   < > 7.0*   < > 6.6* 7.0* 7.4*  MG 1.4*  --  2.2  --  2.4 2.4  --   PHOS 4.9*  --  4.9*  --  4.2 3.7 4.9*   < > = values in this interval not displayed.    Liver Function Tests: Recent Labs  Lab 09/02/20 0557 09/03/20 0602 09/04/20 0552  ALBUMIN 1.1* 1.6* 1.5*   No results for input(s): LIPASE, AMYLASE in the last 168 hours. No results for input(s): AMMONIA in the last 168 hours.  CBC: Recent Labs  Lab 08/16/2020 0638 08/20/2020 1555 09/01/20 1320 09/02/20 0557 09/03/20 0006 09/03/20 0602 09/04/20 0552  WBC 12.2*   < > 26.4* 31.0* 21.2* 21.5* 16.6*  NEUTROABS 8.8*  --  23.8*  --   --   --  15.7*  HGB 7.8*   < >  7.8* 7.1* 8.5* 8.7* 8.6*  HCT 25.1*   < > 24.8* 22.5* 26.6* 25.4* 25.6*  MCV 90.3   < > 91.5 90.7 86.6 85.8 85.9  PLT 273   < > 203 184 157 150 133*   < > = values in this interval not displayed.    Cardiac Enzymes: Recent Labs  Lab 09/01/20 1320  CKTOTAL 115    BNP: Invalid input(s): POCBNP  CBG: Recent Labs  Lab 09/03/20 0749 09/03/20 1137 09/03/20 1622 09/03/20 2139 09/04/20 0716  GLUCAP 80 90 70 128* 142*    Microbiology: Results for orders placed or performed during the hospital encounter of 08/26/2020  SARS Coronavirus 2 by RT PCR (hospital order, performed in [facility] hospital lab) Nasopharyngeal Nasopharyngeal Swab     Status: None   Collection Time: 08/26/2020  6:38 AM   Specimen: Nasopharyngeal Swab  Result Value Ref Range Status   SARS Coronavirus 2 NEGATIVE NEGATIVE  Final    Comment: (NOTE) SARS-CoV-2 target nucleic acids are NOT DETECTED.  The SARS-CoV-2 RNA is generally detectable in upper and lower respiratory specimens during the acute phase of infection. The lowest concentration of SARS-CoV-2 viral copies this assay can detect is 250 copies / mL. A negative result does not preclude SARS-CoV-2 infection and should not be used as the sole basis for treatment or other patient management decisions.  A negative result may occur with improper specimen collection / handling, submission of specimen other than nasopharyngeal swab, presence of viral mutation(s) within the areas targeted by this assay, and inadequate number of viral copies (<250 copies / mL). A negative result must be combined with clinical observations, patient history, and epidemiological information.  Fact Sheet for Patients:   StrictlyIdeas.no  Fact Sheet for Healthcare Providers: BankingDealers.co.za  This test is not yet approved or  cleared by the [place] FDA and has been authorized for detection and/or diagnosis of SARS-CoV-2 by FDA under an Emergency Use Authorization (EUA).  This EUA will remain in effect (meaning this test can be used) for the duration of the COVID-19 declaration under Section 564(b)(1) of the Act, 21 U.S.C. section 360bbb-3(b)(1), unless the authorization is terminated or revoked sooner.  Performed at [facility], [address] [place], [place] [address]   MRSA PCR Screening     Status: None   Collection Time: 08/25/2020  8:13 PM   Specimen: Nasal Mucosa; Nasopharyngeal  Result Value Ref Range Status   MRSA by PCR NEGATIVE NEGATIVE Final    Comment:        The GeneXpert MRSA Assay (FDA approved for NASAL specimens only), is one component of a comprehensive MRSA colonization surveillance program. It is not intended to diagnose MRSA infection nor to guide or monitor treatment for MRSA  infections. Performed at [facility], [address] [place], [place] [address]    *Note: Due to a large number of results and/or encounters for the requested time period, some results have not been displayed. A complete set of results can be found in Results Review.    Coagulation Studies: No results for input(s): LABPROT, INR in the last 72 hours.  Urinalysis: Recent Labs    09/01/20 1252  COLORURINE YELLOW  LABSPEC 1.015  PHURINE 5.0  GLUCOSEU NEGATIVE  HGBUR LARGE*  BILIRUBINUR NEGATIVE  KETONESUR NEGATIVE  PROTEINUR 30*  NITRITE NEGATIVE  LEUKOCYTESUR NEGATIVE      Imaging: DG Chest Port 1V same Day  Result Date: 09/02/2020 CLINICAL DATA:  NG placement EXAM: PORTABLE CHEST  1 VIEW COMPARISON:  09/02/2020 FINDINGS: NG tube in the right lower lobe bronchus has been removed. NG tube is now in the stomach with the tip in the body of the stomach. Left subclavian central venous catheter tip in the SVC unchanged Hypoventilation. Progression of right lower lobe atelectasis/infiltrate. Mild left lower lobe airspace disease unchanged. Small right effusion. IMPRESSION: NG tube in the stomach with the tip in the body of the stomach Progression of right lower lobe airspace disease. Electronically Signed   By: Franchot Gallo M.D.   On: 09/02/2020 13:52   DG Chest Port 1V same Day  Result Date: 09/02/2020 CLINICAL DATA:  NG placement EXAM: PORTABLE CHEST 1 VIEW COMPARISON:  09/02/2020 FINDINGS: Interval placement of NG tube. The NG tube is in the right lower bronchus with the side hole in the distal trachea. There is increased right lower lobe atelectasis. Mild left lower lobe atelectasis No heart failure or effusion.  No pneumothorax Left subclavian central venous catheter tip in the proximal SVC is unchanged. IMPRESSION: NG tube in the right lower lobe bronchus.  Recommend removal. These results were called by telephone at the time of interpretation on 09/02/2020 at 1:17 pm to provider [facility],  who verbally acknowledged these results. Electronically Signed   By: Franchot Gallo M.D.   On: 09/02/2020 13:17   ECHOCARDIOGRAM COMPLETE  Result Date: 09/02/2020    ECHOCARDIOGRAM REPORT   Patient Name:   [name] Date of Exam: 09/02/2020 Medical Rec #:  [id number]        Height:       72.0 in Accession #:    [id number]       Weight:       249.6 lb Date of Birth:  1941/08/14        BSA:          2.341 m Patient Age:    76 years         BP:           103/71 mmHg Patient Gender: F                HR:           85 bpm. Exam Location:  [facility] Procedure: 2D Echo Indications:    Congestive Heart Failure 428.0 / I50.9  History:        Patient has prior history of Echocardiogram examinations, most                 recent 03/22/2020. COPD; Risk Factors:Former Smoker,                 Hypertension, Dyslipidemia and Diabetes. Colonic mass.  Sonographer:    Leavy Cella RDCS (AE) Referring Phys: [id number] [place]  1. Left ventricular ejection fraction, by estimation, is 55 to 60%. The left ventricle has normal function. The left ventricle has no regional wall motion abnormalities. There is mild left ventricular hypertrophy. Left ventricular diastolic parameters are consistent with Grade I diastolic dysfunction (impaired relaxation).  2. Right ventricular systolic function is normal. The right ventricular size is normal. There is mildly elevated pulmonary artery systolic pressure.  3. The mitral valve is normal in structure. Mild mitral valve regurgitation. No evidence of mitral stenosis.  4. The aortic valve is tricuspid. Aortic valve regurgitation is not visualized. No aortic stenosis is present. FINDINGS  Left Ventricle: Left ventricular ejection fraction, by estimation, is 55 to 60%. The left ventricle has normal function. The left ventricle has  no regional wall motion abnormalities. The left ventricular internal cavity size was normal in size. There is  mild left ventricular hypertrophy. Left  ventricular diastolic parameters are consistent with Grade I diastolic dysfunction (impaired relaxation). Normal left ventricular filling pressure. Right Ventricle: The right ventricular size is normal. No increase in right ventricular wall thickness. Right ventricular systolic function is normal. There is mildly elevated pulmonary artery systolic pressure. The tricuspid regurgitant velocity is 2.85  m/s, and with an assumed right atrial pressure of 10 mmHg, the estimated right ventricular systolic pressure is 51.8 mmHg. Left Atrium: Left atrial size was normal in size. Right Atrium: Right atrial size was normal in size. Pericardium: There is no evidence of pericardial effusion. Mitral Valve: The mitral valve is normal in structure. There is mild thickening of the mitral valve leaflet(s). There is mild calcification of the mitral valve leaflet(s). Mild mitral annular calcification. Mild mitral valve regurgitation. No evidence of  mitral valve stenosis. Tricuspid Valve: The tricuspid valve is normal in structure. Tricuspid valve regurgitation is not demonstrated. No evidence of tricuspid stenosis. Aortic Valve: The aortic valve is tricuspid. Aortic valve regurgitation is not visualized. No aortic stenosis is present. Aortic valve mean gradient measures 5.5 mmHg. Aortic valve peak gradient measures 13.3 mmHg. Aortic valve area, by VTI measures 2.39  cm. Pulmonic Valve: The pulmonic valve was not well visualized. Pulmonic valve regurgitation is not visualized. No evidence of pulmonic stenosis. Aorta: The aortic root is normal in size and structure. Pulmonary Artery: Indeterminant PASP, inadequate TR jet. Venous: The inferior vena cava was not well visualized. IAS/Shunts: The interatrial septum was not well visualized.  LEFT VENTRICLE PLAX 2D LVIDd:         3.78 cm  Diastology LVIDs:         2.79 cm  LV e' lateral:   7.83 cm/s LV PW:         1.24 cm  LV E/e' lateral: 8.6 LV IVS:        1.23 cm  LV e' medial:    5.55  cm/s LVOT diam:     2.00 cm  LV E/e' medial:  12.1 LV SV:         82 LV SV Index:   35 LVOT Area:     3.14 cm  RIGHT VENTRICLE RV S prime:     19.30 cm/s TAPSE (M-mode): 2.6 cm LEFT ATRIUM             Index       RIGHT ATRIUM           Index LA diam:        3.50 cm 1.50 cm/m  RA Area:     10.40 cm LA Vol (A2C):   45.7 ml 19.52 ml/m RA Volume:   22.50 ml  9.61 ml/m LA Vol (A4C):   42.2 ml 18.03 ml/m LA Biplane Vol: 48.4 ml 20.68 ml/m  AORTIC VALVE AV Area (Vmax):    2.12 cm AV Area (Vmean):   2.04 cm AV Area (VTI):     2.39 cm AV Vmax:           182.19 cm/s AV Vmean:          106.438 cm/s AV VTI:            0.345 m AV Peak Grad:      13.3 mmHg AV Mean Grad:      5.5 mmHg LVOT Vmax:         123.07  cm/s LVOT Vmean:        68.982 cm/s LVOT VTI:          0.262 m LVOT/AV VTI ratio: 0.76  AORTA Ao Root diam: 2.80 cm MITRAL VALVE                TRICUSPID VALVE MV Area (PHT): 3.68 cm     TR Peak grad:   32.5 mmHg MV Decel Time: 206 msec     TR Vmax:        285.00 cm/s MV E velocity: 67.30 cm/s MV A velocity: 147.00 cm/s  SHUNTS MV E/A ratio:  0.46         Systemic VTI:  0.26 m                             Systemic Diam: 2.00 cm Carlyle Dolly MD Electronically signed by Carlyle Dolly MD Signature Date/Time: 09/02/2020/3:46:15 PM    Final      Medications:   . dextrose 5 % and 0.45% NaCl 50 mL/hr at 09/03/20 0643  . piperacillin-tazobactam (ZOSYN)  IV 3.375 g (09/03/20 2105)   . brimonidine  1 drop Both Eyes Q12H  . Chlorhexidine Gluconate Cloth  6 each Topical Daily  . citalopram  10 mg Oral Daily  . cycloSPORINE  1 drop Both Eyes BID  . diltiazem  120 mg Oral Daily  . ezetimibe  10 mg Oral Daily  . fluticasone  2 spray Each Nare Daily  . furosemide  40 mg Intravenous BID  . gabapentin  300 mg Oral QHS  . heparin injection (subcutaneous)  5,000 Units Subcutaneous Q8H  . insulin aspart  0-20 Units Subcutaneous TID WC  . methylPREDNISolone (SOLU-MEDROL) injection  40 mg Intravenous Q24H  .  multivitamin with minerals  1 tablet Oral QHS  . pantoprazole (PROTONIX) IV  40 mg Intravenous Q24H  . rosuvastatin  10 mg Oral QHS  . sodium chloride flush  10-40 mL Intracatheter Q12H  . timolol  1 drop Both Eyes BID  . venlafaxine XR  150 mg Oral Daily   acetaminophen **OR** acetaminophen, albuterol, diphenhydrAMINE **OR** diphenhydrAMINE, fentaNYL (SUBLIMAZE) injection, LORazepam, ondansetron **OR** ondansetron (ZOFRAN) IV, simethicone, sodium chloride flush, traMADol  Assessment/ Plan:   Acute renal failure.  Appears oliguric.  Agree and favored the diagnosis of acute tubulous necrosis in the setting of hypotension and acute blood loss anemia.  No evidence of obstruction on renal ultrasound.  Creatinine slightly increased today there is no urgent need for dialysis.  We will continue to follow.  Appears to be diuresing well  Hypotension/volume.  We will continue to follow.  Blood pressure improved.  We will continue diuresis for now.  Protein/calorie malnutrition.  Patient receiving both IV albumin and Lasixin order to try to promote mobilization and diuresis  Metabolic acidosis appears mild will continue to follow.  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

## 2020-09-04 NOTE — Progress Notes (Signed)
5 Days Post-Op  Subjective: More alert today.  Daughter at bedside.  More aware of environment.  Objective: Vital signs in last 24 hours: Temp:  [98 F (36.7 C)-98.6 F (37 C)] 98.4 F (36.9 C) (09/05 1118) Pulse Rate:  [82-95] 86 (09/05 0800) Resp:  [14-20] 17 (09/05 0800) BP: (134-193)/(45-88) 175/65 (09/05 0800) SpO2:  [94 %-100 %] 100 % (09/05 0800) Weight:  [106 kg] 106 kg (09/05 0500) Last BM Date: 09/04/20  Intake/Output from previous day: 09/04 0701 - 09/05 0700 In: 892.1 [I.V.:842.1; IV Piggyback:50] Out: 8144 [Urine:2925; Emesis/NG output:400] Intake/Output this shift: Total I/O In: 100 [I.V.:100] Out: -   General appearance: alert, cooperative and no distress Resp: clear to auscultation bilaterally Cardio: regular rate and rhythm, S1, S2 normal, no murmur, click, rub or gallop GI: Soft, incision healing well.  Minimal bowel sounds appreciated.  Lab Results:  Recent Labs    09/03/20 0602 09/04/20 0552  WBC 21.5* 16.6*  HGB 8.7* 8.6*  HCT 25.4* 25.6*  PLT 150 133*   BMET Recent Labs    09/03/20 0602 09/04/20 0552  NA 142 139  K 3.8 3.6  CL 112* 108  CO2 20* 20*  GLUCOSE 82 157*  BUN 54* 62*  CREATININE 3.61* 4.02*  CALCIUM 7.0* 7.4*   PT/INR No results for input(s): LABPROT, INR in the last 72 hours.  Studies/Results: DG Chest Port 1V same Day  Result Date: 09/02/2020 CLINICAL DATA:  NG placement EXAM: PORTABLE CHEST 1 VIEW COMPARISON:  09/02/2020 FINDINGS: NG tube in the right lower lobe bronchus has been removed. NG tube is now in the stomach with the tip in the body of the stomach. Left subclavian central venous catheter tip in the SVC unchanged Hypoventilation. Progression of right lower lobe atelectasis/infiltrate. Mild left lower lobe airspace disease unchanged. Small right effusion. IMPRESSION: NG tube in the stomach with the tip in the body of the stomach Progression of right lower lobe airspace disease. Electronically Signed   By: Franchot Gallo M.D.   On: 09/02/2020 13:52   DG Chest Port 1V same Day  Result Date: 09/02/2020 CLINICAL DATA:  NG placement EXAM: PORTABLE CHEST 1 VIEW COMPARISON:  09/02/2020 FINDINGS: Interval placement of NG tube. The NG tube is in the right lower bronchus with the side hole in the distal trachea. There is increased right lower lobe atelectasis. Mild left lower lobe atelectasis No heart failure or effusion.  No pneumothorax Left subclavian central venous catheter tip in the proximal SVC is unchanged. IMPRESSION: NG tube in the right lower lobe bronchus.  Recommend removal. These results were called by telephone at the time of interpretation on 09/02/2020 at 1:17 pm to provider [facility], who verbally acknowledged these results. Electronically Signed   By: Franchot Gallo M.D.   On: 09/02/2020 13:17   ECHOCARDIOGRAM COMPLETE  Result Date: 09/02/2020    ECHOCARDIOGRAM REPORT   Patient Name:   [name] Date of Exam: 09/02/2020 Medical Rec #:  [id number]        Height:       72.0 in Accession #:    [id number]       Weight:       249.6 lb Date of Birth:  December 09, 1941        BSA:          2.341 m Patient Age:    79 years         BP:           103/71  mmHg Patient Gender: F                HR:           85 bpm. [facility]am Location:  [facility] Procedure: 2D Echo Indications:    Congestive Heart Failure 428.0 / I50.9  History:        Patient has prior history of Echocardiogram examinations, most                 recent 03/22/2020. COPD; Risk Factors:Former Smoker,                 Hypertension, Dyslipidemia and Diabetes. Colonic mass.  Sonographer:    Leavy Cella RDCS (AE) Referring Phys: [id number] [place]  1. Left ventricular ejection fraction, by estimation, is 55 to 60%. The left ventricle has normal function. The left ventricle has no regional wall motion abnormalities. There is mild left ventricular hypertrophy. Left ventricular diastolic parameters are consistent with Grade I diastolic dysfunction (impaired  relaxation).  2. Right ventricular systolic function is normal. The right ventricular size is normal. There is mildly elevated pulmonary artery systolic pressure.  3. The mitral valve is normal in structure. Mild mitral valve regurgitation. No evidence of mitral stenosis.  4. The aortic valve is tricuspid. Aortic valve regurgitation is not visualized. No aortic stenosis is present. FINDINGS  Left Ventricle: Left ventricular ejection fraction, by estimation, is 55 to 60%. The left ventricle has normal function. The left ventricle has no regional wall motion abnormalities. The left ventricular internal cavity size was normal in size. There is  mild left ventricular hypertrophy. Left ventricular diastolic parameters are consistent with Grade I diastolic dysfunction (impaired relaxation). Normal left ventricular filling pressure. Right Ventricle: The right ventricular size is normal. No increase in right ventricular wall thickness. Right ventricular systolic function is normal. There is mildly elevated pulmonary artery systolic pressure. The tricuspid regurgitant velocity is 2.85  m/s, and with an assumed right atrial pressure of 10 mmHg, the estimated right ventricular systolic pressure is 40.9 mmHg. Left Atrium: Left atrial size was normal in size. Right Atrium: Right atrial size was normal in size. Pericardium: There is no evidence of pericardial effusion. Mitral Valve: The mitral valve is normal in structure. There is mild thickening of the mitral valve leaflet(s). There is mild calcification of the mitral valve leaflet(s). Mild mitral annular calcification. Mild mitral valve regurgitation. No evidence of  mitral valve stenosis. Tricuspid Valve: The tricuspid valve is normal in structure. Tricuspid valve regurgitation is not demonstrated. No evidence of tricuspid stenosis. Aortic Valve: The aortic valve is tricuspid. Aortic valve regurgitation is not visualized. No aortic stenosis is present. Aortic valve mean  gradient measures 5.5 mmHg. Aortic valve peak gradient measures 13.3 mmHg. Aortic valve area, by VTI measures 2.39  cm. Pulmonic Valve: The pulmonic valve was not well visualized. Pulmonic valve regurgitation is not visualized. No evidence of pulmonic stenosis. Aorta: The aortic root is normal in size and structure. Pulmonary Artery: Indeterminant PASP, inadequate TR jet. Venous: The inferior vena cava was not well visualized. IAS/Shunts: The interatrial septum was not well visualized.  LEFT VENTRICLE PLAX 2D LVIDd:         3.78 cm  Diastology LVIDs:         2.79 cm  LV e' lateral:   7.83 cm/s LV PW:         1.24 cm  LV E/e' lateral: 8.6 LV IVS:        1.23 cm  LV e' medial:    5.55 cm/s LVOT diam:     2.00 cm  LV E/e' medial:  12.1 LV SV:         82 LV SV Index:   35 LVOT Area:     3.14 cm  RIGHT VENTRICLE RV S prime:     19.30 cm/s TAPSE (M-mode): 2.6 cm LEFT ATRIUM             Index       RIGHT ATRIUM           Index LA diam:        3.50 cm 1.50 cm/m  RA Area:     10.40 cm LA Vol (A2C):   45.7 ml 19.52 ml/m RA Volume:   22.50 ml  9.61 ml/m LA Vol (A4C):   42.2 ml 18.03 ml/m LA Biplane Vol: 48.4 ml 20.68 ml/m  AORTIC VALVE AV Area (Vmax):    2.12 cm AV Area (Vmean):   2.04 cm AV Area (VTI):     2.39 cm AV Vmax:           182.19 cm/s AV Vmean:          106.438 cm/s AV VTI:            0.345 m AV Peak Grad:      13.3 mmHg AV Mean Grad:      5.5 mmHg LVOT Vmax:         123.07 cm/s LVOT Vmean:        68.982 cm/s LVOT VTI:          0.262 m LVOT/AV VTI ratio: 0.76  AORTA Ao Root diam: 2.80 cm MITRAL VALVE                TRICUSPID VALVE MV Area (PHT): 3.68 cm     TR Peak grad:   32.5 mmHg MV Decel Time: 206 msec     TR Vmax:        285.00 cm/s MV E velocity: 67.30 cm/s MV A velocity: 147.00 cm/s  SHUNTS MV E/A ratio:  0.46         Systemic VTI:  0.26 m                             Systemic Diam: 2.00 cm Carlyle Dolly MD Electronically signed by Carlyle Dolly MD Signature Date/Time: 09/02/2020/3:46:15 PM     Final     Anti-infectives: Anti-infectives (From admission, onward)   Start     Dose/Rate Route Frequency Ordered Stop   09/03/20 2200  piperacillin-tazobactam (ZOSYN) IVPB 3.375 g        3.375 g 12.5 mL/hr over 240 Minutes Intravenous Every 12 hours 09/03/20 1057     09/02/20 1800  piperacillin-tazobactam (ZOSYN) IVPB 3.375 g  Status:  Discontinued        3.375 g 12.5 mL/hr over 240 Minutes Intravenous Every 8 hours 09/02/20 1103 09/03/20 1057   09/02/20 0945  piperacillin-tazobactam (ZOSYN) IVPB 3.375 g        3.375 g 100 mL/hr over 30 Minutes Intravenous  Once 09/02/20 0930 09/02/20 1028   08/28/2020 0630  ertapenem (INVANZ) 1,000 mg in sodium chloride 0.9 % 100 mL IVPB        1 g 200 mL/hr over 30 Minutes Intravenous On call to O.R. 07/31/2020 0621 07/31/2020 1030      Assessment/Plan: s/p Procedure(s): Left Hemicolectomy CENTRAL LINE INSERTION Impression: Postoperative day  5.  Patient now having a significant amount of urinary output.  Now nonoliguric acute renal failure.  Respiratory status seems to be more stable.  GI function starting to return.  Still has NG tube in place.  May pull NG tube once patient's mental and respiratory status is more stable.  Appreciate nephrology input.  LOS: 5 days    Aviva Signs 09/04/2020

## 2020-09-05 LAB — RENAL FUNCTION PANEL
Albumin: 1.5 g/dL — ABNORMAL LOW (ref 3.5–5.0)
Anion gap: 15 (ref 5–15)
BUN: 67 mg/dL — ABNORMAL HIGH (ref 8–23)
CO2: 21 mmol/L — ABNORMAL LOW (ref 22–32)
Calcium: 7.9 mg/dL — ABNORMAL LOW (ref 8.9–10.3)
Chloride: 106 mmol/L (ref 98–111)
Creatinine, Ser: 4.24 mg/dL — ABNORMAL HIGH (ref 0.44–1.00)
GFR calc Af Amer: 11 mL/min — ABNORMAL LOW (ref >60)
GFR calc non Af Amer: 9 mL/min — ABNORMAL LOW (ref >60)
Glucose, Bld: 137 mg/dL — ABNORMAL HIGH (ref 70–99)
Phosphorus: 5.6 mg/dL — ABNORMAL HIGH (ref 2.5–4.6)
Potassium: 3.6 mmol/L (ref 3.5–5.1)
Sodium: 142 mmol/L (ref 135–145)

## 2020-09-05 LAB — CBC WITH DIFFERENTIAL/PLATELET
Abs Immature Granulocytes: 0.06 10*3/uL (ref 0.00–0.07)
Basophils Absolute: 0.1 10*3/uL (ref 0.0–0.1)
Basophils Relative: 1 %
Eosinophils Absolute: 0 10*3/uL (ref 0.0–0.5)
Eosinophils Relative: 0 %
HCT: 27.9 % — ABNORMAL LOW (ref 36.0–46.0)
Hemoglobin: 9.3 g/dL — ABNORMAL LOW (ref 12.0–15.0)
Immature Granulocytes: 1 %
Lymphocytes Relative: 5 %
Lymphs Abs: 0.5 10*3/uL — ABNORMAL LOW (ref 0.7–4.0)
MCH: 28.4 pg (ref 26.0–34.0)
MCHC: 33.3 g/dL (ref 30.0–36.0)
MCV: 85.3 fL (ref 80.0–100.0)
Monocytes Absolute: 0.3 10*3/uL (ref 0.1–1.0)
Monocytes Relative: 4 %
Neutro Abs: 8.4 10*3/uL — ABNORMAL HIGH (ref 1.7–7.7)
Neutrophils Relative %: 89 %
Platelets: 124 10*3/uL — ABNORMAL LOW (ref 150–400)
RBC: 3.27 MIL/uL — ABNORMAL LOW (ref 3.87–5.11)
RDW: 18.1 % — ABNORMAL HIGH (ref 11.5–15.5)
WBC Morphology: INCREASED
WBC: 9.3 10*3/uL (ref 4.0–10.5)
nRBC: 0.5 % — ABNORMAL HIGH (ref 0.0–0.2)

## 2020-09-05 LAB — GLUCOSE, CAPILLARY
Glucose-Capillary: 156 mg/dL — ABNORMAL HIGH (ref 70–99)
Glucose-Capillary: 159 mg/dL — ABNORMAL HIGH (ref 70–99)
Glucose-Capillary: 95 mg/dL (ref 70–99)
Glucose-Capillary: 134 mg/dL — ABNORMAL HIGH (ref 70–99)

## 2020-09-05 NOTE — Progress Notes (Signed)
Pharmacy Antibiotic Note  [name] is a 79 y.o. female admitted on 08/27/2020 with intra-abdominal infection.  Pharmacy has been consulted for Zosyn dosing.  Plan: Zosyn 3.375g IV q12h (4 hour infusion). Monitor labs, c/s, and patient improvement.  Height: 6' (182.9 cm) Weight: 106 kg (233 lb 11 oz) IBW/kg (Calculated) : 73.1  Temp (24hrs), Avg:98.3 F (36.8 C), Min:98 F (36.7 C), Max:98.5 F (36.9 C)  Recent Labs  Lab 09/01/20 0515 09/01/20 1320 09/02/20 0557 09/03/20 0006 09/03/20 0602 09/04/20 0552 09/05/20 0506  WBC 25.3*   < > 31.0* 21.2* 21.5* 16.6* 9.3  CREATININE 2.55*  --  3.18*  --  3.61* 4.02* 4.24*   < > = values in this interval not displayed.    Estimated Creatinine Clearance: 15.1 mL/min (A) (by C-G formula based on SCr of 4.24 mg/dL (H)).    No Known Allergies  Antimicrobials this admission: Zosyn 9/3 >>     Microbiology results: 8/31 MRSA PCR: neg  Thank you for allowing pharmacy to be a part of this patients care.  Donna Christen Denzil Bristol 09/05/2020 10:57 AM

## 2020-09-05 NOTE — Care Management Important Message (Signed)
Important Message  Patient Details  Name: [name] MRN: [id number] Date of Birth: 1941-07-11   Medicare Important Message Given:  Yes     Tommy Medal 09/05/2020, 2:57 PM

## 2020-09-05 NOTE — Progress Notes (Signed)
[facility] ROUNDING NOTE   Subjective:   pt had 3 liters UOP over 9/5 charted.  She has been on lasix as well as D51/2 NS at 50 ml/hr.  Feels better but not great  Review of systems:  Has been NPO; some output from NG tube Much more alert per nursing and pt agrees Reports some shortness of breath - no chest pain   Background on consult:  Brief history: 79 year old lady hypertension hyperlipidemia diabetes COPD  , admitted 08/21/2020 for partial colectomy due to near obstruction of sigmoid colon.  Baseline serum creatinine actually appears to be within the normal range 1 mg/dL 08/16/2020 on admission her creatinine was 1.2 mg/dL.she developed oliguric acute kidney injury 60 to acute tubal necrosis in the setting of acute blood loss anemia hypotension requiring pressors.  She does not use nonsteroidal anti-inflammatory drugs ACE inhibitors or ARB's.  Renal ultrasound did not stray any evidence of obstruction.     Objective:  Vital signs in last 24 hours:  Temp:  [98 F (36.7 C)-98.5 F (36.9 C)] 98.5 F (36.9 C) (09/06 0400) Pulse Rate:  [80-99] 91 (09/06 0757) Resp:  [15-23] 20 (09/06 0757) BP: (153-197)/(57-82) 168/61 (09/06 0600) SpO2:  [99 %-100 %] 100 % (09/06 0757) Weight:  [106 kg] 106 kg (09/06 0500)  Weight change: 0 kg Filed Weights   09/02/20 0400 09/04/20 0500 09/05/20 0500  Weight: 113.2 kg 106 kg 106 kg    Intake/Output: I/O last 3 completed shifts: In: 1358.5 [I.V.:1258.5; IV Piggyback:100] Out: 3267 [Urine:4275; Emesis/NG output:150]   Intake/Output this shift:  No intake/output data recorded.  Gen: elderly female in bed obese habitus  CVS: S1S2 no rub Resp: clear to auscultation anteriorly; unlabored at rest  Abd: obese, nontender, vertical incision c/d/i Ext: 1+ edema upper and lower extremities  Neuro awake and oriented to person and location, year Psych calm mood and affect   Basic Metabolic Panel: Recent Labs  Lab 08/31/20 0458  08/31/20 0458 09/01/20 0515 09/01/20 0515 09/02/20 0557 09/02/20 0557 09/03/20 0602 09/04/20 0552 09/05/20 0506  NA 142   < > 140  --  138  --  142 139 142  K 3.9   < > 4.6  --  4.6  --  3.8 3.6 3.6  CL 113*   < > 111  --  111  --  112* 108 106  CO2 19*   < > 17*  --  19*  --  20* 20* 21*  GLUCOSE 100*   < > 171*  --  338*  --  82 157* 137*  BUN 18   < > 28*  --  41*  --  54* 62* 67*  CREATININE 1.76*   < > 2.55*  --  3.18*  --  3.61* 4.02* 4.24*  CALCIUM 6.9*   < > 7.0*   < > 6.6*   < > 7.0* 7.4* 7.9*  MG 1.4*  --  2.2  --  2.4  --  2.4  --   --   PHOS 4.9*   < > 4.9*  --  4.2  --  3.7 4.9* 5.6*   < > = values in this interval not displayed.    Liver Function Tests: Recent Labs  Lab 09/02/20 0557 09/03/20 0602 09/04/20 0552 09/05/20 0506  ALBUMIN 1.1* 1.6* 1.5* 1.5*   No results for input(s): LIPASE, AMYLASE in the last 168 hours. No results for input(s): AMMONIA in the last 168 hours.  CBC: Recent  Labs  Lab 08/17/2020 7782 08/13/2020 1555 09/01/20 1320 09/01/20 1320 09/02/20 0557 09/03/20 0006 09/03/20 0602 09/04/20 0552 09/05/20 0506  WBC 12.2*   < > 26.4*   < > 31.0* 21.2* 21.5* 16.6* 9.3  NEUTROABS 8.8*  --  23.8*  --   --   --   --  15.7* 8.4*  HGB 7.8*   < > 7.8*   < > 7.1* 8.5* 8.7* 8.6* 9.3*  HCT 25.1*   < > 24.8*   < > 22.5* 26.6* 25.4* 25.6* 27.9*  MCV 90.3   < > 91.5   < > 90.7 86.6 85.8 85.9 85.3  PLT 273   < > 203   < > 184 157 150 133* 124*   < > = values in this interval not displayed.   Microbiology: Results for orders placed or performed during the hospital encounter of 08/18/2020  SARS Coronavirus 2 by RT PCR (hospital order, performed in [facility] hospital lab) Nasopharyngeal Nasopharyngeal Swab     Status: None   Collection Time: 08/27/2020  6:38 AM   Specimen: Nasopharyngeal Swab  Result Value Ref Range Status   SARS Coronavirus 2 NEGATIVE NEGATIVE Final    Comment: (NOTE) SARS-CoV-2 target nucleic acids are NOT DETECTED.  The SARS-CoV-2  RNA is generally detectable in upper and lower respiratory specimens during the acute phase of infection. The lowest concentration of SARS-CoV-2 viral copies this assay can detect is 250 copies / mL. A negative result does not preclude SARS-CoV-2 infection and should not be used as the sole basis for treatment or other patient management decisions.  A negative result may occur with improper specimen collection / handling, submission of specimen other than nasopharyngeal swab, presence of viral mutation(s) within the areas targeted by this assay, and inadequate number of viral copies (<250 copies / mL). A negative result must be combined with clinical observations, patient history, and epidemiological information.  Fact Sheet for Patients:   StrictlyIdeas.no  Fact Sheet for Healthcare Providers: BankingDealers.co.za  This test is not yet approved or  cleared by the [place] FDA and has been authorized for detection and/or diagnosis of SARS-CoV-2 by FDA under an Emergency Use Authorization (EUA).  This EUA will remain in effect (meaning this test can be used) for the duration of the COVID-19 declaration under Section 564(b)(1) of the Act, 21 U.S.C. section 360bbb-3(b)(1), unless the authorization is terminated or revoked sooner.  Performed at [facility], [address] [place], [place] [address]   MRSA PCR Screening     Status: None   Collection Time: 08/13/2020  8:13 PM   Specimen: Nasal Mucosa; Nasopharyngeal  Result Value Ref Range Status   MRSA by PCR NEGATIVE NEGATIVE Final    Comment:        The GeneXpert MRSA Assay (FDA approved for NASAL specimens only), is one component of a comprehensive MRSA colonization surveillance program. It is not intended to diagnose MRSA infection nor to guide or monitor treatment for MRSA infections. Performed at [facility], [address], [place], [place] [address]    *Note: Due to a  large number of results and/or encounters for the requested time period, some results have not been displayed. A complete set of results can be found in Results Review.    Medications:   . dextrose 5 % and 0.45% NaCl 50 mL/hr at 09/04/20 1549  . piperacillin-tazobactam (ZOSYN)  IV 3.375 g (09/05/20 0912)   . brimonidine  1 drop Both Eyes Q12H  .  Chlorhexidine Gluconate Cloth  6 each Topical Daily  . citalopram  10 mg Oral Daily  . cycloSPORINE  1 drop Both Eyes BID  . diltiazem  120 mg Oral Daily  . ezetimibe  10 mg Oral Daily  . fluticasone  2 spray Each Nare Daily  . furosemide  40 mg Intravenous BID  . gabapentin  300 mg Oral QHS  . heparin injection (subcutaneous)  5,000 Units Subcutaneous Q8H  . insulin aspart  0-20 Units Subcutaneous TID WC  . methylPREDNISolone (SOLU-MEDROL) injection  40 mg Intravenous Q24H  . multivitamin with minerals  1 tablet Oral QHS  . pantoprazole (PROTONIX) IV  40 mg Intravenous Q24H  . rosuvastatin  10 mg Oral QHS  . sodium chloride flush  10-40 mL Intracatheter Q12H  . timolol  1 drop Both Eyes BID  . venlafaxine XR  150 mg Oral Daily   acetaminophen **OR** acetaminophen, albuterol, diphenhydrAMINE **OR** diphenhydrAMINE, fentaNYL (SUBLIMAZE) injection, LORazepam, ondansetron **OR** ondansetron (ZOFRAN) IV, simethicone, sodium chloride flush, traMADol  Assessment/ Plan:   Acute renal failure.  Agree and favored the diagnosis of acute tubulous necrosis in the setting of hypotension and acute blood loss anemia.  No evidence of obstruction on renal ultrasound.  Stop scheduled lasix   Reassess resuming lasix on 9/7 after this pause  Hypertension - 150's on my exam; acceptable   Protein/calorie malnutrition.  Optimize nutrition per primary team   Metabolic acidosis appears mild will continue to follow.  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  Improving   Claudia Desanctis, MD  09/05/2020 9:31 AM

## 2020-09-05 NOTE — Progress Notes (Signed)
[facility] Progress Note  6 Days Post-Op  Subjective:  Much more awake and appropriate today. Oriented to self and location for me. UOP improved but Cr up. Nephrology holding lasix today. Having BM and no nausea, Ng output minimal.   Objective: Vital signs in last 24 hours: Temp:  [98 F (36.7 C)-98.5 F (36.9 C)] 98.5 F (36.9 C) (09/06 0400) Pulse Rate:  [80-99] 91 (09/06 0800) Resp:  [16-23] 19 (09/06 0800) BP: (150-197)/(57-82) 150/67 (09/06 0800) SpO2:  [99 %-100 %] 100 % (09/06 0800) Weight:  [106 kg] 106 kg (09/06 0500) Last BM Date: 09/04/20  Intake/Output from previous day: 09/05 0701 - 09/06 0700 In: 466.4 [I.V.:416.4; IV Piggyback:50] Out: 3000 [Urine:3000] Intake/Output this shift: No intake/output data recorded.  General appearance: alert, cooperative and no distress Resp: normal work of breathing GI: soft, nondistended, appropriately tender, midline incision with honeycomb dressing, no erythema or drainage, betadine staining  Lab Results:  Recent Labs    09/04/20 0552 09/05/20 0506  WBC 16.6* 9.3  HGB 8.6* 9.3*  HCT 25.6* 27.9*  PLT 133* 124*   BMET Recent Labs    09/04/20 0552 09/05/20 0506  NA 139 142  K 3.6 3.6  CL 108 106  CO2 20* 21*  GLUCOSE 157* 137*  BUN 62* 67*  CREATININE 4.02* 4.24*  CALCIUM 7.4* 7.9*   PT/INR No results for input(s): LABPROT, INR in the last 72 hours.  Studies/Results: No results found.  Anti-infectives: Anti-infectives (From admission, onward)   Start     Dose/Rate Route Frequency Ordered Stop   09/03/20 2200  piperacillin-tazobactam (ZOSYN) IVPB 3.375 g        3.375 g 12.5 mL/hr over 240 Minutes Intravenous Every 12 hours 09/03/20 1057     09/02/20 1800  piperacillin-tazobactam (ZOSYN) IVPB 3.375 g  Status:  Discontinued        3.375 g 12.5 mL/hr over 240 Minutes Intravenous Every 8 hours 09/02/20 1103 09/03/20 1057   09/02/20 0945  piperacillin-tazobactam (ZOSYN) IVPB 3.375 g         3.375 g 100 mL/hr over 30 Minutes Intravenous  Once 09/02/20 0930 09/02/20 1028   08/08/2020 0630  ertapenem (INVANZ) 1,000 mg in sodium chloride 0.9 % 100 mL IVPB        1 g 200 mL/hr over 30 Minutes Intravenous On call to O.R. 08/10/2020 0621 08/24/2020 1030      Assessment/Plan: Ms. 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 Doing better.  PRN for pain IS, OOB NG out, clears- RN to make sure swallowing safely Renal labs in AM Nephrology holding lasix today H&H stable Zosyn for possible PNA/ intraabdominal contamination SCds, heparin sq  Appreciate Nephrology assistance. Will keep in ICU today and hopefully can transfer tomorrow. Updated [name], daughter over the phone.   LOS: 6 days    Virl Cagey 09/05/2020

## 2020-09-06 ENCOUNTER — Ambulatory Visit (HOSPITAL_COMMUNITY): Payer: Medicare Other | Attending: Family Medicine

## 2020-09-06 ENCOUNTER — Telehealth (HOSPITAL_COMMUNITY): Payer: Self-pay | Admitting: *Deleted

## 2020-09-06 LAB — RENAL FUNCTION PANEL
Albumin: 1.2 g/dL — ABNORMAL LOW (ref 3.5–5.0)
Anion gap: 15 (ref 5–15)
BUN: 74 mg/dL — ABNORMAL HIGH (ref 8–23)
CO2: 22 mmol/L (ref 22–32)
Calcium: 7.7 mg/dL — ABNORMAL LOW (ref 8.9–10.3)
Chloride: 106 mmol/L (ref 98–111)
Creatinine, Ser: 4.33 mg/dL — ABNORMAL HIGH (ref 0.44–1.00)
GFR calc Af Amer: 11 mL/min — ABNORMAL LOW (ref >60)
GFR calc non Af Amer: 9 mL/min — ABNORMAL LOW (ref >60)
Glucose, Bld: 110 mg/dL — ABNORMAL HIGH (ref 70–99)
Phosphorus: 5 mg/dL — ABNORMAL HIGH (ref 2.5–4.6)
Potassium: 3.4 mmol/L — ABNORMAL LOW (ref 3.5–5.1)
Sodium: 143 mmol/L (ref 135–145)

## 2020-09-06 LAB — GLUCOSE, CAPILLARY
Glucose-Capillary: 152 mg/dL — ABNORMAL HIGH (ref 70–99)
Glucose-Capillary: 158 mg/dL — ABNORMAL HIGH (ref 70–99)
Glucose-Capillary: 134 mg/dL — ABNORMAL HIGH (ref 70–99)
Glucose-Capillary: 112 mg/dL — ABNORMAL HIGH (ref 70–99)

## 2020-09-06 MED ORDER — PREDNISONE 10 MG PO TABS
10.0000 mg | ORAL_TABLET | Freq: Every day | ORAL | Status: DC
  Administered 2020-09-06 – 2020-09-07 (×2): 10 mg via ORAL
  Filled 2020-09-06 (×2): qty 1

## 2020-09-06 MED ORDER — BOOST / RESOURCE BREEZE PO LIQD CUSTOM
1.0000 | Freq: Three times a day (TID) | ORAL | Status: DC
  Administered 2020-09-06 – 2020-09-07 (×5): 1 via ORAL

## 2020-09-06 MED ORDER — PANTOPRAZOLE SODIUM 40 MG PO TBEC
40.0000 mg | DELAYED_RELEASE_TABLET | Freq: Every day | ORAL | Status: DC
  Administered 2020-09-06 – 2020-09-07 (×2): 40 mg via ORAL
  Filled 2020-09-06 (×2): qty 1

## 2020-09-06 NOTE — TOC Initial Note (Addendum)
Transition of Care [facility]) - Initial/Assessment Note   Patient Details  Name: [name] MRN: [id number] Date of Birth: 1941-03-20  Transition of Care [facility]) CM/SW Contact:    Sherie Don, LCSW Phone Number: 09/06/2020, 10:48 AM  Clinical Narrative: Patient is a 79 year old female who was admitted for surgery for a colonic mass. Readmission prevention checklist completed due to high readmission score. Per physician, patient will likely need SNF for rehab. CSW spoke with patient's daughter, [name], who confirmed patient is agreeable to SNF. FL2 completed and referral faxed out. TOC to follow.  Addendum: 11:09am-CSW notified by Ebony Hail with [facility] the patient is currently a facility resident for SNF, so she can return once ready for discharge.  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: Artist            DME Arranged: N/A DME Agency: NA HH Arranged: NA [place] Agency: NA  Prior Living Arrangements/Services Living arrangements for the past 2 months: [place] with:: Self Patient language and need for interpreter reviewed:: Yes Do you feel safe going back to the place where you live?: Yes      Need for Family Participation in Patient Care: Yes (Comment) Care giver support system in place?: Yes (comment) Current home services: DME (Wheelchair,  BSC, shower chair) Criminal Activity/Legal Involvement Pertinent to Current Situation/Hospitalization: No - Comment as needed  Permission Sought/Granted Permission sought to share information with : Facility Sport and exercise psychologist, Family Supports Permission granted to share information with : Yes, Verbal Permission Granted Share Information with NAME: [name] (daughter) Permission granted to share info w AGENCY: SNFs  Emotional Assessment Appearance:: Appears stated age Attitude/Demeanor/Rapport: Lethargic Affect (typically observed): Accepting Orientation: : Oriented to Self, Oriented to Place, Oriented to  Time, Oriented to Situation Alcohol / Substance Use: Not Applicable Psych Involvement: No (comment)  Admission diagnosis:  Colonic mass [K63.89] Patient Active Problem List   Diagnosis Date Noted  . Colonic mass 08/19/2020  . Mass of colon   . Difficult intravenous access   . Altered mental status 08/13/2020  . Hyperglycemia 08/13/2020  . Generalized weakness 08/13/2020  . Hypoalbuminemia 08/13/2020  . Hospital discharge follow-up 08/12/2020  . Diarrhea 07/15/2020  . GI bleed 07/03/2020  . Abnormal CT scan, colon 07/02/2020  . Normochromic normocytic anemia 06/22/2020  . Hypertension associated with stage 3 chronic kidney disease due to type 2 diabetes mellitus ([place]) 06/21/2020  . Chronic non-seasonal allergic rhinitis 06/21/2020  . Type 2 diabetes mellitus with diabetic neuropathy, unspecified ([place]) 06/21/2020  . Hyperlipidemia associated with type 2 diabetes mellitus ([place]) 06/21/2020  . Diabetic peripheral neuropathy associated with type 2 diabetes mellitus ([place]) 06/21/2020  . Urinary urgency 06/21/2020  . Pelvic mass in female 06/21/2020  . Acute blood loss anemia 06/21/2020  . COPD (chronic obstructive pulmonary disease) ([place]) 06/21/2020  . Increased intraocular pressure, bilateral 06/21/2020  . Enteritis due to Clostridium difficile 06/18/2020  . Acute  colitis 06/12/2020  . Hypomagnesemia 06/12/2020  .  Colitis 06/12/2020  . Osteoarthritis of both knees 05/24/2020  . Elevated alkaline phosphatase measurement 10/21/2019  . Back pain 07/05/2019  . Rectal bleeding 01/08/2019  . Chronic constipation 01/08/2019  . Nausea without vomiting 11/03/2018  . Trigger finger, right index finger   . Monoclonal gammopathy of unknown significance (MGUS) 11/26/2017  . Memory loss of unknown cause 11/10/2017  . Chronic left shoulder pain 09/14/2017  . Anemia in chronic renal disease 12/30/2015  . Morbid obesity ([place]) 04/18/2015  . Myasthenia gravis in remission ([place]) 11/24/2014  . Myasthenia gravis ([place]) 11/16/2014  . Vitamin D deficiency 05/24/2014  . Osteopenia 04/26/2014  . Macular hole of left eye 01/19/2014  . Generalized osteoarthritis 05/25/2013  . Esophageal dysphagia 04/03/2012  . Abnormal chest CT 03/02/2012  . Insomnia 01/02/2012  . Malignant hypertension 05/30/2011  . Schatzki's ring 05/07/2011  . INGROWN TOENAIL 01/10/2011  . Unspecified glaucoma 08/08/2010  . DYSCHROMIA, UNSPECIFIED 08/08/2010  . CKD stage 3 due to type 2 diabetes mellitus ([place]) 09/14/2009  . HIP PAIN, LEFT 06/09/2009  . Urinary incontinence 06/09/2009  . Obesity (BMI 30.0-34.9) 03/06/2009  . IDA (iron deficiency anemia) 10/02/2008  . Hypothyroidism 09/27/2008  . Hyperlipemia 06/30/2008  . Depression with anxiety 06/30/2008  . GERD 06/30/2008  . Obstructive sleep apnea 06/30/2008  . CARPAL TUNNEL SYNDROME 05/19/2008  . SHOULDER PAIN 02/02/2008  . IMPINGEMENT SYNDROME 02/02/2008  . DEGENERATIVE JOINT DISEASE, KNEE 11/24/2007  . Type 2 diabetes mellitus ([place]) 09/09/2007   PCP:  Fayrene Helper, MD Pharmacy:   [facility] [place] [address] Phone: [phone] Fax: [phone]  [facility] - [place], [place] - [address] [place] [address] Phone:  [phone] Fax: [phone]  Readmission Risk Interventions Readmission Risk Prevention Plan 09/06/2020 07/07/2020  Transportation Screening Complete Complete  Medication Review (RN Care Manager) Complete Complete  PCP or Specialist appointment within 3-5 days of discharge Not Complete Complete  PCP/Specialist Appt Not Complete comments Patient expected to discharge to SNF -  [place] or Home Care Consult Complete Not Complete  SW Recovery Care/Counseling Consult Complete Complete  Palliative Care Screening Not Applicable Not Applicable  Skilled Nursing Facility Complete Complete  Some recent data might be hidden

## 2020-09-06 NOTE — NC FL2 (Signed)
[place] LEVEL OF CARE SCREENING TOOL     IDENTIFICATION  Patient Name: [name] Birthdate: 1941-11-14 Sex: female Admission Date (Current Location): 08/03/2020  [facility] and [place] Number:  [employer] and Address:  [place] [address] [place]      Provider Number: [id number]  Attending Physician Name and Address:  Aviva Signs, MD  Relative Name and Phone Number:  [name] (daughter) Ph: [phone]    Current Level of Care: Hospital Recommended Level of Care: [place] Prior Approval Number:    Date Approved/Denied:   PASRR Number: [id number]  Discharge Plan: SNF    Current Diagnoses: Patient Active Problem List   Diagnosis Date Noted  . Colonic mass 08/23/2020  . Mass of colon   . Difficult intravenous access   . Altered mental status 08/13/2020  . Hyperglycemia 08/13/2020  . Generalized weakness 08/13/2020  . Hypoalbuminemia 08/13/2020  . Hospital discharge follow-up 08/12/2020  . Diarrhea 07/15/2020  . GI bleed 07/03/2020  . Abnormal CT scan, colon 07/02/2020  . Normochromic normocytic anemia 06/22/2020  . 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 Chronic non-seasonal allergic rhinitis 06/21/2020  . Type 2 diabetes mellitus with diabetic neuropathy, unspecified ([place]) 06/21/2020  . Hyperlipidemia associated with type 2 diabetes mellitus ([place]) 06/21/2020  . Diabetic peripheral neuropathy associated with type 2 diabetes mellitus ([place]) 06/21/2020  . Urinary urgency 06/21/2020  . Pelvic mass in female 06/21/2020  . Acute blood loss anemia 06/21/2020  . COPD (chronic obstructive pulmonary disease) ([place]) 06/21/2020  . Increased intraocular pressure, bilateral 06/21/2020  . Enteritis due to Clostridium difficile 06/18/2020  . Acute colitis 06/12/2020  . Hypomagnesemia 06/12/2020  . Colitis 06/12/2020  .  Osteoarthritis of both knees 05/24/2020  . Elevated alkaline phosphatase measurement 10/21/2019  . Back pain 07/05/2019  . Rectal bleeding 01/08/2019  . Chronic constipation 01/08/2019  . Nausea without vomiting 11/03/2018  . Trigger finger, right index finger   . Monoclonal gammopathy of unknown significance (MGUS) 11/26/2017  . Memory loss of unknown cause 11/10/2017  . Chronic left shoulder pain 09/14/2017  . Anemia in chronic renal disease 12/30/2015  . Morbid obesity ([place]) 04/18/2015  . Myasthenia gravis in remission ([place]) 11/24/2014  . Myasthenia gravis ([place]) 11/16/2014  . Vitamin D deficiency 05/24/2014  . Osteopenia 04/26/2014  . Macular hole of left eye 01/19/2014  . Generalized osteoarthritis 05/25/2013  . Esophageal dysphagia 04/03/2012  . Abnormal chest CT 03/02/2012  . Insomnia 01/02/2012  . Malignant hypertension 05/30/2011  . Schatzki's ring 05/07/2011  . INGROWN TOENAIL 01/10/2011  . Unspecified glaucoma 08/08/2010  . DYSCHROMIA, UNSPECIFIED 08/08/2010  . CKD stage 3 due to type 2 diabetes mellitus ([place]) 09/14/2009  . HIP PAIN, LEFT 06/09/2009  . Urinary incontinence 06/09/2009  . Obesity (BMI 30.0-34.9) 03/06/2009  . IDA (iron deficiency anemia) 10/02/2008  . Hypothyroidism 09/27/2008  . Hyperlipemia 06/30/2008  . Depression with anxiety 06/30/2008  . GERD 06/30/2008  . Obstructive sleep apnea 06/30/2008  . CARPAL TUNNEL SYNDROME 05/19/2008  . SHOULDER PAIN 02/02/2008  . IMPINGEMENT SYNDROME 02/02/2008  . DEGENERATIVE JOINT DISEASE, KNEE 11/24/2007  . Type 2 diabetes mellitus ([place]) 09/09/2007    Orientation RESPIRATION BLADDER Height & Weight     Self, Time, Situation, Place  Normal Incontinent Weight: 231 lb 11.3 oz (105.1 kg) Height:  6' (182.9 cm)  BEHAVIORAL SYMPTOMS/MOOD NEUROLOGICAL BOWEL NUTRITION STATUS  Incontinent Diet (Full liquid)  AMBULATORY STATUS COMMUNICATION OF NEEDS Skin   Extensive Assist Verbally Surgical wounds, Other  (Comment) (Blistering: right upper arm)                       Personal Care Assistance Level of Assistance  Bathing, Feeding, Dressing Bathing Assistance: Limited assistance Feeding assistance: Independent Dressing Assistance: Limited assistance     Functional Limitations Info  Sight, Hearing, Speech Sight Info: Impaired Hearing Info: Adequate Speech Info: Adequate    SPECIAL CARE FACTORS FREQUENCY  PT (By licensed PT)     PT Frequency: 5x's/week              Contractures Contractures Info: Not present    Additional Factors Info  Psychotropic, Code Status, Allergies, Insulin Sliding Scale Code Status Info: Full Allergies Info: NKA Psychotropic Info: Celexa, neurontin, Effexor Insulin Sliding Scale Info: CBG < 70: Implement Hypoglycemia Standing Orders and refer to Hypoglycemia Standing Orders sidebar report; CBG 70 - 120: 0 units; CBG 121 - 150: 3 units; CBG 151 - 200: 4 units; CBG 201 - 250: 7 units; CBG 251 - 300: 11 units; CBG 301 - 350: 15 units; CBG 351 - 400: 20 units; CBG > 400 call MD and obtain STAT lab verification       Current Medications (09/06/2020):  This is the current hospital active medication list Current Facility-Administered Medications  Medication Dose Route Frequency Provider Last Rate Last Admin  . acetaminophen (TYLENOL) tablet 650 mg  650 mg Oral Q6H PRN Aviva Signs, MD       Or  . acetaminophen (TYLENOL) suppository 650 mg  650 mg Rectal Q6H PRN Aviva Signs, MD      . albuterol (PROVENTIL) (2.5 MG/3ML) 0.083% nebulizer solution 3 mL  3 mL Inhalation Q6H PRN Aviva Signs, MD      . brimonidine (ALPHAGAN) 0.2 % ophthalmic solution 1 drop  1 drop Both Eyes Q12H Aviva Signs, MD   1 drop at 09/06/20 0912  . Chlorhexidine Gluconate Cloth 2 % PADS 6 each  6 each Topical Daily Aviva Signs, MD   6 each at 09/06/20 0915  . citalopram (CELEXA) tablet 10 mg  10 mg Oral Daily Aviva Signs, MD   10 mg at 09/06/20 0914  . cycloSPORINE  (RESTASIS) 0.05 % ophthalmic emulsion 1 drop  1 drop Both Eyes BID Aviva Signs, MD   1 drop at 09/06/20 0915  . dextrose 5 %-0.45 % sodium chloride infusion   Intravenous Continuous Donato Heinz, MD 50 mL/hr at 09/06/20 8466 Rate Verify at 09/06/20 5993  . diltiazem (CARDIZEM CD) 24 hr capsule 120 mg  120 mg Oral Daily Aviva Signs, MD   120 mg at 09/06/20 0918  . diphenhydrAMINE (BENADRYL) 12.5 MG/5ML elixir 12.5 mg  12.5 mg Oral Q6H PRN Aviva Signs, MD       Or  . diphenhydrAMINE (BENADRYL) injection 12.5 mg  12.5 mg Intravenous Q6H PRN Aviva Signs, MD   12.5 mg at 09/01/20 2118  . ezetimibe (ZETIA) tablet 10 mg  10 mg Oral Daily Aviva Signs, MD   10 mg at 09/06/20 0914  . feeding supplement (BOOST / RESOURCE BREEZE) liquid 1 Container  1 Container Oral TID BM Aviva Signs, MD   1 Container at 09/06/20 0915  . fentaNYL (SUBLIMAZE) injection 12.5 mcg  12.5 mcg Intravenous Q2H PRN Aviva Signs, MD   12.5 mcg at 09/06/20 0809  . fluticasone (FLONASE) 50 MCG/ACT nasal spray 2  spray  2 spray Each Nare Daily Aviva Signs, MD   2 spray at 09/06/20 [phone]  . gabapentin (NEURONTIN) capsule 300 mg  300 mg Oral QHS Donato Heinz, MD   300 mg at 09/05/20 2114  . heparin injection 5,000 Units  5,000 Units Subcutaneous Q8H Aviva Signs, MD   5,000 Units at 09/06/20 [phone]  . insulin aspart (novoLOG) injection 0-20 Units  0-20 Units Subcutaneous TID WC Aviva Signs, MD   4 Units at 09/06/20 0845  . multivitamin with minerals tablet 1 tablet  1 tablet Oral QHS Aviva Signs, MD   1 tablet at 09/05/20 2114  . ondansetron (ZOFRAN-ODT) disintegrating tablet 4 mg  4 mg Oral Q6H PRN Aviva Signs, MD       Or  . ondansetron [facility]) injection 4 mg  4 mg Intravenous Q6H PRN Aviva Signs, MD   4 mg at 09/01/20 1016  . pantoprazole (PROTONIX) EC tablet 40 mg  40 mg Oral Q1200 Aviva Signs, MD      . piperacillin-tazobactam (ZOSYN) IVPB 3.375 g  3.375 g Intravenous Q12H Aviva Signs, MD 12.5 mL/hr  at 09/06/20 0919 3.375 g at 09/06/20 0919  . predniSONE (DELTASONE) tablet 10 mg  10 mg Oral Q breakfast Aviva Signs, MD   10 mg at 09/06/20 0918  . rosuvastatin (CRESTOR) tablet 10 mg  10 mg Oral QHS Aviva Signs, MD   10 mg at 09/05/20 2114  . simethicone (MYLICON) chewable tablet 40 mg  40 mg Oral Q6H PRN Aviva Signs, MD      . sodium chloride flush (NS) 0.9 % injection 10-40 mL  10-40 mL Intracatheter Q12H Aviva Signs, MD   10 mL at 09/06/20 0915  . sodium chloride flush (NS) 0.9 % injection 10-40 mL  10-40 mL Intracatheter PRN Aviva Signs, MD      . timolol (TIMOPTIC) 0.5 % ophthalmic solution 1 drop  1 drop Both Eyes BID Aviva Signs, MD   1 drop at 09/06/20 0912  . traMADol (ULTRAM) tablet 50 mg  50 mg Oral Q6H PRN Aviva Signs, MD   50 mg at 09/02/20 0403  . venlafaxine XR (EFFEXOR-XR) 24 hr capsule 150 mg  150 mg Oral Daily Aviva Signs, MD   150 mg at 09/06/20 0915     Discharge Medications: Please see discharge summary for a list of discharge medications.  Relevant Imaging Results:  Relevant Lab Results:   Additional Information SS#: [id number]  Sherie Don, LCSW

## 2020-09-06 NOTE — Plan of Care (Addendum)
  Problem: Acute Rehab PT Goals(only PT should resolve) Goal: Pt will Roll Supine to Side Outcome: Progressing Flowsheets (Taken 09/06/2020 1216) Pt will Roll Supine to Side: . with mod assist . with min assist Goal: Pt Will Go Supine/Side To Sit Outcome: Progressing Flowsheets (Taken 09/06/2020 1216) Pt will go Supine/Side to Sit: with moderate assist Goal: Patient Will Transfer Sit To/From Stand Outcome: Progressing Flowsheets (Taken 09/06/2020 1216) Patient will transfer sit to/from stand: with moderate assist Goal: Pt Will Transfer Bed To Chair/Chair To Bed Outcome: Progressing Flowsheets (Taken 09/06/2020 1216) Pt will Transfer Bed to Chair/Chair to Bed: with mod assist Goal: Pt Will Ambulate Outcome: Progressing Flowsheets (Taken 09/06/2020 1216) Pt will Ambulate: . 10 feet . with rolling walker . with moderate assist . with maximum assist   12:17 PM , 09/06/20 Karlyn Agee, SPT Physical Therapy with [facility] [phone] office  During this treatment session, the therapist was present, participating in and directing the treatment.  2:25 PM, 09/06/20 Lonell Grandchild, MPT Physical Therapist with [facility] [phone] office [phone] mobile phone

## 2020-09-06 NOTE — Progress Notes (Signed)
[facility] ROUNDING NOTE   Subjective:    1.8L UOP  ? 3Kg up from admit  No SOB, on RA, BP ok  On D5 1/2NS @50 /h  Foley in place  Review of systems:   Background on consult:  Brief history: 79 year old lady hypertension hyperlipidemia diabetes COPD  , admitted 08/11/2020 for partial colectomy due to near obstruction of sigmoid colon.  Baseline serum creatinine actually appears to be within the normal range 1 mg/dL 08/16/2020 on admission her creatinine was 1.2 mg/dL.she developed oliguric acute kidney injury 60 to acute tubal necrosis in the setting of acute blood loss anemia hypotension requiring pressors.  She does not use nonsteroidal anti-inflammatory drugs ACE inhibitors or ARB's.  Renal ultrasound did not stray any evidence of obstruction.     Objective:  Vital signs in last 24 hours:  Temp:  [98.5 F (36.9 C)-99 F (37.2 C)] 98.6 F (37 C) (09/07 0813) Pulse Rate:  [91-102] 100 (09/07 0813) Resp:  [8-26] 20 (09/07 0813) BP: (93-150)/(43-70) 119/65 (09/07 0700) SpO2:  [96 %-100 %] 99 % (09/07 0813) Weight:  [105.1 kg] 105.1 kg (09/07 0500)  Weight change: -0.9 kg Filed Weights   09/04/20 0500 09/05/20 0500 09/06/20 0500  Weight: 106 kg 106 kg 105.1 kg    Intake/Output: I/O last 3 completed shifts: In: 1352.4 [I.V.:1202.4; IV Piggyback:150] Out: 2440 [Urine:3100]   Intake/Output this shift:  No intake/output data recorded.  Gen: elderly female in bed obese habitus  CVS: S1S2 no rub Resp: clear to auscultation anteriorly; unlabored at rest  Abd: obese, nontender, vertical incision c/d/i Ext: trace edema upper and lower extremities  Neuro awake and oriented to person and location, year Psych calm mood and affect   Basic Metabolic Panel: Recent Labs  Lab 08/31/20 0458 08/31/20 0458 09/01/20 0515 09/01/20 0515 09/02/20 0557 09/02/20 0557 09/03/20 0602 09/03/20 0602 09/04/20 0552 09/05/20 0506 09/06/20 0331  NA 142   < > 140   < >  138  --  142  --  139 142 143  K 3.9   < > 4.6   < > 4.6  --  3.8  --  3.6 3.6 3.4*  CL 113*   < > 111   < > 111  --  112*  --  108 106 106  CO2 19*   < > 17*   < > 19*  --  20*  --  20* 21* 22  GLUCOSE 100*   < > 171*   < > 338*  --  82  --  157* 137* 110*  BUN 18   < > 28*   < > 41*  --  54*  --  62* 67* 74*  CREATININE 1.76*   < > 2.55*   < > 3.18*  --  3.61*  --  4.02* 4.24* 4.33*  CALCIUM 6.9*   < > 7.0*   < > 6.6*   < > 7.0*   < > 7.4* 7.9* 7.7*  MG 1.4*  --  2.2  --  2.4  --  2.4  --   --   --   --   PHOS 4.9*   < > 4.9*   < > 4.2  --  3.7  --  4.9* 5.6* 5.0*   < > = values in this interval not displayed.    Liver Function Tests: Recent Labs  Lab 09/02/20 0557 09/03/20 0602 09/04/20 0552 09/05/20 0506 09/06/20 0331  ALBUMIN 1.1* 1.6* 1.5* 1.5*  1.2*   No results for input(s): LIPASE, AMYLASE in the last 168 hours. No results for input(s): AMMONIA in the last 168 hours.  CBC: Recent Labs  Lab 09/01/20 1320 09/01/20 1320 09/02/20 0557 09/03/20 0006 09/03/20 0602 09/04/20 0552 09/05/20 0506  WBC 26.4*   < > 31.0* 21.2* 21.5* 16.6* 9.3  NEUTROABS 23.8*  --   --   --   --  15.7* 8.4*  HGB 7.8*   < > 7.1* 8.5* 8.7* 8.6* 9.3*  HCT 24.8*   < > 22.5* 26.6* 25.4* 25.6* 27.9*  MCV 91.5   < > 90.7 86.6 85.8 85.9 85.3  PLT 203   < > 184 157 150 133* 124*   < > = values in this interval not displayed.   Microbiology: Results for orders placed or performed during the hospital encounter of 08/05/2020  SARS Coronavirus 2 by RT PCR (hospital order, performed in [facility] hospital lab) Nasopharyngeal Nasopharyngeal Swab     Status: None   Collection Time: 08/18/2020  6:38 AM   Specimen: Nasopharyngeal Swab  Result Value Ref Range Status   SARS Coronavirus 2 NEGATIVE NEGATIVE Final    Comment: (NOTE) SARS-CoV-2 target nucleic acids are NOT DETECTED.  The SARS-CoV-2 RNA is generally detectable in upper and lower respiratory specimens during the acute phase of infection. The  lowest concentration of SARS-CoV-2 viral copies this assay can detect is 250 copies / mL. A negative result does not preclude SARS-CoV-2 infection and should not be used as the sole basis for treatment or other patient management decisions.  A negative result may occur with improper specimen collection / handling, submission of specimen other than nasopharyngeal swab, presence of viral mutation(s) within the areas targeted by this assay, and inadequate number of viral copies (<250 copies / mL). A negative result must be combined with clinical observations, patient history, and epidemiological information.  Fact Sheet for Patients:   StrictlyIdeas.no  Fact Sheet for Healthcare Providers: BankingDealers.co.za  This test is not yet approved or  cleared by the [place] FDA and has been authorized for detection and/or diagnosis of SARS-CoV-2 by FDA under an Emergency Use Authorization (EUA).  This EUA will remain in effect (meaning this test can be used) for the duration of the COVID-19 declaration under Section 564(b)(1) of the Act, 21 U.S.C. section 360bbb-3(b)(1), unless the authorization is terminated or revoked sooner.  Performed at [facility], [address] [place], [place] [address]   MRSA PCR Screening     Status: None   Collection Time: 08/23/2020  8:13 PM   Specimen: Nasal Mucosa; Nasopharyngeal  Result Value Ref Range Status   MRSA by PCR NEGATIVE NEGATIVE Final    Comment:        The GeneXpert MRSA Assay (FDA approved for NASAL specimens only), is one component of a comprehensive MRSA colonization surveillance program. It is not intended to diagnose MRSA infection nor to guide or monitor treatment for MRSA infections. Performed at [facility], [address], [place], [place] [address]    *Note: Due to a large number of results and/or encounters for the requested time period, some results have not been displayed.  A complete set of results can be found in Results Review.    Medications:   . dextrose 5 % and 0.45% NaCl 50 mL/hr at 09/06/20 0648  . piperacillin-tazobactam (ZOSYN)  IV Stopped (09/06/20 0113)   . brimonidine  1 drop Both Eyes Q12H  . Chlorhexidine Gluconate Cloth  6  each Topical Daily  . citalopram  10 mg Oral Daily  . cycloSPORINE  1 drop Both Eyes BID  . diltiazem  120 mg Oral Daily  . ezetimibe  10 mg Oral Daily  . feeding supplement  1 Container Oral TID BM  . fluticasone  2 spray Each Nare Daily  . gabapentin  300 mg Oral QHS  . heparin injection (subcutaneous)  5,000 Units Subcutaneous Q8H  . insulin aspart  0-20 Units Subcutaneous TID WC  . multivitamin with minerals  1 tablet Oral QHS  . pantoprazole  40 mg Oral Q1200  . predniSONE  10 mg Oral Q breakfast  . rosuvastatin  10 mg Oral QHS  . sodium chloride flush  10-40 mL Intracatheter Q12H  . timolol  1 drop Both Eyes BID  . venlafaxine XR  150 mg Oral Daily   acetaminophen **OR** acetaminophen, albuterol, diphenhydrAMINE **OR** diphenhydrAMINE, fentaNYL (SUBLIMAZE) injection, ondansetron **OR** ondansetron (ZOFRAN) IV, simethicone, sodium chloride flush, traMADol  Assessment/ Plan:   Nonoliguric AKI 2/2 ATN, BL SCr 1 to 1.2.  Good uOP, no diuretics, cont supportive care, would stop IVFs when tolerating PO, would not give diuretics today.  Hypertension - stable normal BPs currently   Protein/calorie malnutrition.  Hypoalbuminemia. Optimize nutrition per primary team   Metabolic acidosis stable, no meds needed  Colonic mass - note status post hemicolectomy in the left POD#7  Diabetes mellitus as per primary service  Altered mental status caution with administration of neuro-sedative medications  Anemia acute blood loss - periop.  Stable  Rexene Agent, MD  09/06/2020 9:15 AM

## 2020-09-06 NOTE — Evaluation (Addendum)
Physical Therapy Evaluation Patient Details Name: [name] MRN: [id number] DOB: May 03, 1941 Today's Date: 09/06/2020   History of Present Illness  Patient is a 79 year old black female with multiple medical problems who was referred to my care by Dr. Moshe Cipro and Dr. Abbey Chatters of gastroenterology for evaluation and treatment of a near obstructing sigmoid colon mass.  This was found on recent admission and work-up for lower abdominal discomfort and blood in her stools.  Patient initially was to be discharged to skilled nursing unit but she refused and went home.  Since that time, she has returned to the emergency room multiple times and now is at the [facility].  She states that she has had decreased appetite for some time now.  Her bowel movements are more loose and watery in nature.  She denies having a solid bowel movement.  She denies any nausea or vomiting.  She has been on a liquid diet since discharge.    Clinical Impression  The patient was somnolent throughout session today and required frequent VC's to maintain arousal for participation. She required mod/max assist for rolling to sidelying and required max assist for sidelying to supine transfer. She reported pain with assisted L hip abduction/adduction while supine. Once sitting EOB, the patient demonstrated posterior trunk lean with frequent LOB requiring physical assistance to recover. She required excessive VC's to attempt to maintain sitting balance without increase in pain. O2 sat was 88% sitting EOB on RA. Patient unable to attempt sit to stand or transfers due to weakness and poor sitting balance.  She performed sit to supine transfer max assist and was left in bed with call bell in arm's reach after therapy and nurse tech and RN present. PLAN: The patient will continue to benefit from skilled physical therapy services in hospital at recommended venue below in order to improve balance, gait, and ADL's to promote independence in  functional activities.      Follow Up Recommendations SNF    Equipment Recommendations  None recommended by PT    Recommendations for Other Services       Precautions / Restrictions Precautions Precautions: Fall Restrictions Weight Bearing Restrictions: No      Mobility  Bed Mobility Overal bed mobility: Needs Assistance Bed Mobility: Rolling;Supine to Sit;Sidelying to Sit Rolling: Max assist;Mod assist Sidelying to sit: Max assist Supine to sit: Max assist Sit to supine: Max assist   General bed mobility comments: slow labored movement, difficult following commands to perform bed mobility  Transfers                 General transfer comment: unable to perform due to lack of sitting balance at EOB  Ambulation/Gait                Stairs            Wheelchair Mobility    Modified Rankin (Stroke Patients Only)       Balance Overall balance assessment: Needs assistance Sitting-balance support: No upper extremity supported;Feet supported Sitting balance-Leahy Scale: Poor Sitting balance - Comments: sitting EOB, VC's to attempt to maintain balance Postural control: Posterior lean Standing balance support:  (unable to assess due to lack of sitting balance at EOB)                                 Pertinent Vitals/Pain Pain Assessment: 0-10 Pain Score: 8  Pain Location: abdomen Pain Descriptors / Indicators: Discomfort;Sore;Grimacing;Crying  Pain Intervention(s): Limited activity within patient's tolerance;Monitored during session;Premedicated before session;Relaxation;Repositioned    Home Living Family/patient expects to be discharged to:: Private residence Living Arrangements: Children Available Help at Discharge: Family;Available PRN/intermittently Type of Home: Apartment Home Access: Stairs to enter Entrance Stairs-Rails: Left Entrance Stairs-Number of Steps: 4 Home Layout: One level Home Equipment: Shower seat;Cane -  single point;Walker - 2 wheels      Prior Function Level of Independence: Independent with assistive device(s)         Comments: Patient states household ambulation with SPC and RW, able to complete basic adl independently     Hand Dominance        Extremity/Trunk Assessment   Upper Extremity Assessment Upper Extremity Assessment: Generalized weakness    Lower Extremity Assessment Lower Extremity Assessment: Generalized weakness    Cervical / Trunk Assessment Cervical / Trunk Assessment: Normal  Communication   Communication: No difficulties  Cognition Arousal/Alertness: Lethargic (somnolent) Behavior During Therapy: WFL for tasks assessed/performed Overall Cognitive Status: Within Functional Limits for tasks assessed                                 General Comments: pt somnolent throughout session, difficult to arouse for tasks of therapy      General Comments      Exercises     Assessment/Plan    PT Assessment Patient needs continued PT services  PT Problem List Decreased strength;Decreased mobility;Decreased activity tolerance;Decreased balance;Decreased range of motion;Decreased coordination;Decreased cognition;Pain       PT Treatment Interventions DME instruction;Therapeutic exercise;Gait training;Balance training;Stair training;Neuromuscular re-education;Functional mobility training;Therapeutic activities;Patient/family education    PT Goals (Current goals can be found in the Care Plan section)  Acute Rehab PT Goals Patient Stated Goal: Return home PT Goal Formulation: With patient Time For Goal Achievement: 09/20/20 Potential to Achieve Goals: Fair    Frequency Min 3X/week   Barriers to discharge        Co-evaluation               AM-PAC PT "6 Clicks" Mobility  Outcome Measure Help needed turning from your back to your side while in a flat bed without using bedrails?: A Lot Help needed moving from lying on your back to  sitting on the side of a flat bed without using bedrails?: A Lot Help needed moving to and from a bed to a chair (including a wheelchair)?: Total Help needed standing up from a chair using your arms (e.g., wheelchair or bedside chair)?: A Lot Help needed to walk in hospital room?: Total Help needed climbing 3-5 steps with a railing? : Total 6 Click Score: 9    End of Session Equipment Utilized During Treatment: Gait belt Activity Tolerance: Patient limited by pain;Patient limited by lethargy;Patient limited by fatigue Patient left: in bed;with call bell/phone within reach;with nursing/sitter in room Nurse Communication: Mobility status PT Visit Diagnosis: Unsteadiness on feet (R26.81);Other abnormalities of gait and mobility (R26.89);Muscle weakness (generalized) (M62.81);Pain Pain - part of body:  (abdomen)    Time: 2831-5176 PT Time Calculation (min) (ACUTE ONLY): 30 min   Charges:   PT Evaluation $PT Eval Moderate Complexity: 1 Mod PT Treatments $Therapeutic Activity: 23-37 mins        2:23 PM , 09/06/20 Karlyn Agee, SPT Physical Therapy with [facility] [phone] office   During this treatment session, the therapist was present, participating in and directing the treatment.  2:23 PM, 09/06/20 Lonell Grandchild, MPT Physical Therapist with [facility] [phone] office [phone] mobile phone

## 2020-09-06 NOTE — Progress Notes (Signed)
7 Days Post-Op  Subjective: [name] appeared tired this morning but much more interactive than several days ago. A&Ox3. She reports doing well this morning though she has continued abdominal pain that she states made it difficult to sleep last night. Passed flatus this AM. Small amount of urine in foley bag. Denies fever, body aches, chills, chest pain, and palpitations.  Objective: Vital signs in last 24 hours: Temp:  [98.5 F (36.9 C)-99 F (37.2 C)] 98.8 F (37.1 C) (09/07 0400) Pulse Rate:  [91-102] 98 (09/07 0700) Resp:  [8-27] 18 (09/07 0700) BP: (93-155)/(43-71) 119/65 (09/07 0700) SpO2:  [96 %-100 %] 99 % (09/07 0700) Weight:  [105.1 kg] 105.1 kg (09/07 0500) Last BM Date: 09/05/20  Intake/Output from previous day: 09/06 0701 - 09/07 0700 In: 1352.4 [I.V.:1202.4; IV Piggyback:150] Out: 7672 [Urine:1750] Intake/Output this shift: No intake/output data recorded.  General appearance: alert, cooperative and no distress Resp: normal work of breathing, normal breath sounds bilaterally GI: soft, nondistended, appropriately tender, midline incision with honeycomb dressing, no erythema or drainage, betadine staining Neurological: A&Ox3   Lab Results:  Recent Labs    09/04/20 0552 09/05/20 0506  WBC 16.6* 9.3  HGB 8.6* 9.3*  HCT 25.6* 27.9*  PLT 133* 124*   BMET Recent Labs    09/05/20 0506 09/06/20 0331  NA 142 143  K 3.6 3.4*  CL 106 106  CO2 21* 22  GLUCOSE 137* 110*  BUN 67* 74*  CREATININE 4.24* 4.33*  CALCIUM 7.9* 7.7*   Anti-infectives: Anti-infectives (From admission, onward)   Start     Dose/Rate Route Frequency Ordered Stop   09/03/20 2200  piperacillin-tazobactam (ZOSYN) IVPB 3.375 g        3.375 g 12.5 mL/hr over 240 Minutes Intravenous Every 12 hours 09/03/20 1057     09/02/20 1800  piperacillin-tazobactam (ZOSYN) IVPB 3.375 g  Status:  Discontinued        3.375 g 12.5 mL/hr over 240 Minutes Intravenous Every 8 hours 09/02/20 1103 09/03/20  1057   09/02/20 0945  piperacillin-tazobactam (ZOSYN) IVPB 3.375 g        3.375 g 100 mL/hr over 30 Minutes Intravenous  Once 09/02/20 0930 09/02/20 1028   08/07/2020 0630  ertapenem (INVANZ) 1,000 mg in sodium chloride 0.9 % 100 mL IVPB        1 g 200 mL/hr over 30 Minutes Intravenous On call to O.R. 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 She is doing well this morning though having continued abdominal pain. Urine output this AM at 450 mL with 2-3 liters per day 9/4-9/6. Minor rise in Cr to 4.33 from 4.24. Nephrology to determine restarting Lasix today. Albumin remains low at 1.2 s/p IV albumin on 9/4, will provide Ensure once tolerating clear liquid diet well. Otherwise pt seems to be progressing well with stable H&H for several days. Leukocytosis resolved to 9.6 on 9/6 however left shift present with increased bands. Will continue Zosyn for possible aspiration PNA or intra-abdominal etiology. Encourage IS use and OOB. -Clear liquid diet, Ensure nutrition supplement after tolerating clears -IS, OOB -Zosyn -SCDs, Heparin SQ -Nephrology to determine restarting lasix this AM  Appreciate nephrology recommendations.    LOS: 7 days    [name] 09/06/2020

## 2020-09-06 NOTE — Progress Notes (Addendum)
7 Days Post-Op  Subjective: Patient alert this morning.  Seems to be tolerating clear liquid diets.  Objective: Vital signs in last 24 hours: Temp:  [98.5 F (36.9 C)-99 F (37.2 C)] 98.6 F (37 C) (09/07 0813) Pulse Rate:  [91-102] 100 (09/07 0813) Resp:  [8-27] 20 (09/07 0813) BP: (93-155)/(43-71) 119/65 (09/07 0700) SpO2:  [96 %-100 %] 99 % (09/07 0813) Weight:  [105.1 kg] 105.1 kg (09/07 0500) Last BM Date: 09/05/20  Intake/Output from previous day: 09/06 0701 - 09/07 0700 In: 1352.4 [I.V.:1202.4; IV Piggyback:150] Out: 1749 [Urine:1750] Intake/Output this shift: No intake/output data recorded.  General appearance: alert, cooperative and no distress Resp: clear to auscultation bilaterally Cardio: regular rate and rhythm, S1, S2 normal, no murmur, click, rub or gallop GI: Soft, incision healing well.  Occasional bowel sounds appreciated.  Lab Results:  Recent Labs    09/04/20 0552 09/05/20 0506  WBC 16.6* 9.3  HGB 8.6* 9.3*  HCT 25.6* 27.9*  PLT 133* 124*   BMET Recent Labs    09/05/20 0506 09/06/20 0331  NA 142 143  K 3.6 3.4*  CL 106 106  CO2 21* 22  GLUCOSE 137* 110*  BUN 67* 74*  CREATININE 4.24* 4.33*  CALCIUM 7.9* 7.7*   PT/INR No results for input(s): LABPROT, INR in the last 72 hours.  Studies/Results: No results found.  Anti-infectives: Anti-infectives (From admission, onward)   Start     Dose/Rate Route Frequency Ordered Stop   09/03/20 2200  piperacillin-tazobactam (ZOSYN) IVPB 3.375 g        3.375 g 12.5 mL/hr over 240 Minutes Intravenous Every 12 hours 09/03/20 1057     09/02/20 1800  piperacillin-tazobactam (ZOSYN) IVPB 3.375 g  Status:  Discontinued        3.375 g 12.5 mL/hr over 240 Minutes Intravenous Every 8 hours 09/02/20 1103 09/03/20 1057   09/02/20 0945  piperacillin-tazobactam (ZOSYN) IVPB 3.375 g        3.375 g 100 mL/hr over 30 Minutes Intravenous  Once 09/02/20 0930 09/02/20 1028   08/10/2020 0630  ertapenem (INVANZ)  1,000 mg in sodium chloride 0.9 % 100 mL IVPB        1 g 200 mL/hr over 30 Minutes Intravenous On call to O.R. 08/29/2020 0621 08/29/2020 1030      Assessment/Plan: s/p Procedure(s): Left Hemicolectomy CENTRAL LINE INSERTION Impression: Postoperative day 7, status post left hemicolectomy.  Central line is still in place and there is no evidence of infection at this time.  Patient is a very hard peripheral IV access.  Due to her renal failure, cannot place PICC at this time.  Nonoliguric ATN with continued rise in BUN and creatinine.  We will continue to monitor with nephrology.  We will stop Solu-Medrol and switch to prednisone 10 mg daily.  Will advance to full liquid diet as I think this will settle better in her stomach.  LOS: 7 days    Aviva Signs 09/06/2020

## 2020-09-07 ENCOUNTER — Inpatient Hospital Stay (HOSPITAL_COMMUNITY): Payer: Medicare Other

## 2020-09-07 ENCOUNTER — Inpatient Hospital Stay: Payer: Self-pay

## 2020-09-07 ENCOUNTER — Other Ambulatory Visit: Payer: Self-pay

## 2020-09-07 LAB — CBC
HCT: 28.1 % — ABNORMAL LOW (ref 36.0–46.0)
Hemoglobin: 9 g/dL — ABNORMAL LOW (ref 12.0–15.0)
MCH: 27.8 pg (ref 26.0–34.0)
MCHC: 32 g/dL (ref 30.0–36.0)
MCV: 86.7 fL (ref 80.0–100.0)
Platelets: 190 10*3/uL (ref 150–400)
RBC: 3.24 MIL/uL — ABNORMAL LOW (ref 3.87–5.11)
RDW: 17.9 % — ABNORMAL HIGH (ref 11.5–15.5)
WBC: 23.1 10*3/uL — ABNORMAL HIGH (ref 4.0–10.5)
nRBC: 0.2 % (ref 0.0–0.2)

## 2020-09-07 LAB — RENAL FUNCTION PANEL
Albumin: 1.3 g/dL — ABNORMAL LOW (ref 3.5–5.0)
Anion gap: 16 — ABNORMAL HIGH (ref 5–15)
BUN: 84 mg/dL — ABNORMAL HIGH (ref 8–23)
CO2: 23 mmol/L (ref 22–32)
Calcium: 8.3 mg/dL — ABNORMAL LOW (ref 8.9–10.3)
Chloride: 104 mmol/L (ref 98–111)
Creatinine, Ser: 4.45 mg/dL — ABNORMAL HIGH (ref 0.44–1.00)
GFR calc Af Amer: 10 mL/min — ABNORMAL LOW (ref >60)
GFR calc non Af Amer: 9 mL/min — ABNORMAL LOW (ref >60)
Glucose, Bld: 121 mg/dL — ABNORMAL HIGH (ref 70–99)
Phosphorus: 6 mg/dL — ABNORMAL HIGH (ref 2.5–4.6)
Potassium: 3.8 mmol/L (ref 3.5–5.1)
Sodium: 143 mmol/L (ref 135–145)

## 2020-09-07 LAB — GLUCOSE, CAPILLARY
Glucose-Capillary: 115 mg/dL — ABNORMAL HIGH (ref 70–99)
Glucose-Capillary: 104 mg/dL — ABNORMAL HIGH (ref 70–99)
Glucose-Capillary: 71 mg/dL (ref 70–99)
Glucose-Capillary: 38 mg/dL — CL (ref 70–99)
Glucose-Capillary: 112 mg/dL — ABNORMAL HIGH (ref 70–99)
Glucose-Capillary: 87 mg/dL (ref 70–99)

## 2020-09-07 MED ORDER — SODIUM CHLORIDE 0.9% FLUSH: 10.0000 mL | Freq: Two times a day (BID) | INTRAVENOUS | Status: DC

## 2020-09-07 MED ORDER — SODIUM CHLORIDE 0.9% FLUSH: 10.0000 mL | INTRAVENOUS | Status: DC | PRN

## 2020-09-07 MED ORDER — DEXTROSE 50 % IV SOLN
INTRAVENOUS | Status: AC
  Administered 2020-09-07: 50 mL
  Filled 2020-09-07: qty 50

## 2020-09-07 MED ORDER — NOREPINEPHRINE 4 MG/250ML-% IV SOLN: 0.0000 ug/min | INTRAVENOUS | Status: DC

## 2020-09-07 MED ORDER — LACTATED RINGERS IV SOLN: INTRAVENOUS | Status: AC

## 2020-09-07 MED ORDER — NOREPINEPHRINE 4 MG/250ML-% IV SOLN
INTRAVENOUS | Status: AC
  Administered 2020-09-07: 2 ug/min via INTRAVENOUS
  Filled 2020-09-07: qty 250

## 2020-09-07 NOTE — Progress Notes (Signed)
Spoke with [place] RN regarding PICC placement. To check with SWOT nurse Jeanice Lim and see if she's available to place today.

## 2020-09-07 NOTE — Progress Notes (Signed)
CT results noted.  Have to assume anastomotic breakdown due to renal failure, hypotension.  Have set up IR drainage of pelvis tomorrow.  Will make NPO except meds and start TPN tomorrow.  Daughter updated.

## 2020-09-07 NOTE — Progress Notes (Signed)
[name] ROUNDING NOTE   Subjective:    0.4L of urine output recorded for the last 24 hours  Still poor PO intake  Worsening leukocytosis today, afebrile  Foley in place  She reports some thirst otherwise no complaints  Review of systems:  12 point review of systems conducted and are negative except for what is mentioned above   Objective:  Vital signs in last 24 hours:  Temp:  [97.5 F (36.4 C)-98.7 F (37.1 C)] 97.9 F (36.6 C) (09/08 0818) Pulse Rate:  [83-98] 86 (09/08 0900) Resp:  [15-22] 16 (09/08 0600) BP: (115-173)/(45-104) 140/49 (09/08 0900) SpO2:  [93 %-97 %] 96 % (09/08 0900) Weight:  [102.7 kg] 102.7 kg (09/08 0500)  Weight change: -2.4 kg Filed Weights   09/05/20 0500 09/06/20 0500 09/07/20 0500  Weight: 106 kg 105.1 kg 102.7 kg    Intake/Output: I/O last 3 completed shifts: In: 1217.9 [P.O.:360; I.V.:657.9; IV Piggyback:200] Out: 920 [Urine:920]   Intake/Output this shift:  Total I/O In: 120 [P.O.:120] Out: -   Gen: elderly female in bed obese habitus  HEENT: dry mucosal membranes CVS: S1S2 no rub Resp: clear to auscultation anteriorly; unlabored at rest, bilateral chest expansion, on room air Abd: obese, nontender, vertical incision c/d/i Ext: no significant edema  Skin: poor skin turgor, left central line c/d/i Neuro awake and oriented to person and location, year, follow commands Psych calm mood and affect GU: +foley   Basic Metabolic Panel: Recent Labs  Lab 09/01/20 0515 09/01/20 0515 09/02/20 0557 09/02/20 0557 09/03/20 0602 09/03/20 0602 09/04/20 0552 09/04/20 0552 09/05/20 0506 09/06/20 0331 09/07/20 0521  NA 140   < > 138   < > 142  --  139  --  142 143 143  K 4.6   < > 4.6   < > 3.8  --  3.6  --  3.6 3.4* 3.8  CL 111   < > 111   < > 112*  --  108  --  106 106 104  CO2 17*   < > 19*   < > 20*  --  20*  --  21* 22 23  GLUCOSE 171*   < > 338*   < > 82  --  157*  --  137* 110* 121*  BUN 28*   < > 41*    < > 54*  --  62*  --  67* 74* 84*  CREATININE 2.55*   < > 3.18*   < > 3.61*  --  4.02*  --  4.24* 4.33* 4.45*  CALCIUM 7.0*   < > 6.6*   < > 7.0*   < > 7.4*   < > 7.9* 7.7* 8.3*  MG 2.2  --  2.4  --  2.4  --   --   --   --   --   --   PHOS 4.9*   < > 4.2   < > 3.7  --  4.9*  --  5.6* 5.0* 6.0*   < > = values in this interval not displayed.    Liver Function Tests: Recent Labs  Lab 09/03/20 0602 09/04/20 0552 09/05/20 0506 09/06/20 0331 09/07/20 0521  ALBUMIN 1.6* 1.5* 1.5* 1.2* 1.3*   No results for input(s): LIPASE, AMYLASE in the last 168 hours. No results for input(s): AMMONIA in the last 168 hours.  CBC: Recent Labs  Lab 09/01/20 1320 09/02/20 0557 09/03/20 0006 09/03/20 0602 09/04/20 0552 09/05/20 0506 09/07/20 0521  WBC  26.4*   < > 21.2* 21.5* 16.6* 9.3 23.1*  NEUTROABS 23.8*  --   --   --  15.7* 8.4*  --   HGB 7.8*   < > 8.5* 8.7* 8.6* 9.3* 9.0*  HCT 24.8*   < > 26.6* 25.4* 25.6* 27.9* 28.1*  MCV 91.5   < > 86.6 85.8 85.9 85.3 86.7  PLT 203   < > 157 150 133* 124* 190   < > = values in this interval not displayed.   Microbiology: Results for orders placed or performed during the hospital encounter of 08/24/2020  SARS Coronavirus 2 by RT PCR (hospital order, performed in [facility] hospital lab) Nasopharyngeal Nasopharyngeal Swab     Status: None   Collection Time: 08/01/2020  6:38 AM   Specimen: Nasopharyngeal Swab  Result Value Ref Range Status   SARS Coronavirus 2 NEGATIVE NEGATIVE Final    Comment: (NOTE) SARS-CoV-2 target nucleic acids are NOT DETECTED.  The SARS-CoV-2 RNA is generally detectable in upper and lower respiratory specimens during the acute phase of infection. The lowest concentration of SARS-CoV-2 viral copies this assay can detect is 250 copies / mL. A negative result does not preclude SARS-CoV-2 infection and should not be used as the sole basis for treatment or other patient management decisions.  A negative result may occur  with improper specimen collection / handling, submission of specimen other than nasopharyngeal swab, presence of viral mutation(s) within the areas targeted by this assay, and inadequate number of viral copies (<250 copies / mL). A negative result must be combined with clinical observations, patient history, and epidemiological information.  Fact Sheet for Patients:   StrictlyIdeas.no  Fact Sheet for Healthcare Providers: BankingDealers.co.za  This test is not yet approved or  cleared by the [place] FDA and has been authorized for detection and/or diagnosis of SARS-CoV-2 by FDA under an Emergency Use Authorization (EUA).  This EUA will remain in effect (meaning this test can be used) for the duration of the COVID-19 declaration under Section 564(b)(1) of the Act, 21 U.S.C. section 360bbb-3(b)(1), unless the authorization is terminated or revoked sooner.  Performed at [facility], [address], [place], [place] [address]   MRSA PCR Screening     Status: None   Collection Time: 08/15/2020  8:13 PM   Specimen: Nasal Mucosa; Nasopharyngeal  Result Value Ref Range Status   MRSA by PCR NEGATIVE NEGATIVE Final    Comment:        The GeneXpert MRSA Assay (FDA approved for NASAL specimens only), is one component of a comprehensive MRSA colonization surveillance program. It is not intended to diagnose MRSA infection nor to guide or monitor treatment for MRSA infections. Performed at [facility], [address], [place], [place] [address]    *Note: Due to a large number of results and/or encounters for the requested time period, some results have not been displayed. A complete set of results can be found in Results Review.    Medications:    lactated ringers     piperacillin-tazobactam (ZOSYN)  IV 3.375 g (09/07/20 0908)    brimonidine  1 drop Both Eyes Q12H   Chlorhexidine Gluconate Cloth  6 each Topical Daily    citalopram  10 mg Oral Daily   cycloSPORINE  1 drop Both Eyes BID   diltiazem  120 mg Oral Daily   ezetimibe  10 mg Oral Daily   feeding supplement  1 Container Oral TID BM   fluticasone  2 spray  Each Nare Daily   gabapentin  300 mg Oral QHS   heparin injection (subcutaneous)  5,000 Units Subcutaneous Q8H   insulin aspart  0-20 Units Subcutaneous TID WC   multivitamin with minerals  1 tablet Oral QHS   pantoprazole  40 mg Oral Q1200   predniSONE  10 mg Oral Q breakfast   rosuvastatin  10 mg Oral QHS   sodium chloride flush  10-40 mL Intracatheter Q12H   timolol  1 drop Both Eyes BID   venlafaxine XR  150 mg Oral Daily   acetaminophen **OR** acetaminophen, albuterol, diphenhydrAMINE **OR** diphenhydrAMINE, fentaNYL (SUBLIMAZE) injection, ondansetron **OR** ondansetron (ZOFRAN) IV, simethicone, sodium chloride flush, traMADol  Assessment/ Plan:   Background on consult:  Brief history: 79 year old lady hypertension hyperlipidemia diabetes COPD  , admitted 08/29/2020 for partial colectomy due to near obstruction of sigmoid colon.  Baseline serum creatinine actually appears to be within the normal range 1 mg/dL 08/16/2020 on admission her creatinine was 1.2 mg/dL.she developed oliguric acute kidney injury secondary to acute tubal necrosis in the setting of acute blood loss anemia hypotension requiring pressors.  She does not use nonsteroidal anti-inflammatory drugs ACE inhibitors or ARB's.  Renal ultrasound did not show any evidence of obstruction.   Nonoliguric AKI 2/2 ATN, BL SCr 1 to 1.2. No diuretics, cont supportive care, still has very poor PO intake. Will give her 1L of LR over 10 hours and reassess in AM  Leukocytosis. Afebrile, secondary to steroids? Infectious workup per primary service, possible removal of central access. Would be cautious with contrast administration especially as she looks like she is on the drier side  Hypertension - stable normal BPs currently    Protein/calorie malnutrition.  Hypoalbuminemia. Optimize nutrition per primary team   Metabolic acidosis stable, no meds needed  Colonic mass - note status post hemicolectomy in the left POD#8  Diabetes mellitus as per primary service  Altered mental status caution with administration of neuro-sedative medications  Anemia acute blood loss - periop.  Stable  Gean Quint, MD  [facility] 09/07/2020 9:46 AM

## 2020-09-07 NOTE — Progress Notes (Signed)
Blood sugar checked: 38 D50 given  BP low, MAPS averaging in the 40s. MD notified. Order given for Levophed gtt.   Rectal Temp: 96.5 F; warm blankets placed on patient.

## 2020-09-07 NOTE — Progress Notes (Signed)
8 Days Post-Op  Subjective: [name] reports feeling better this morning. Remains tired but responsive. Her abdominal pain is mildly improved and she has not had any nausea or vomiting. Reports small amount of PO intake but that she does have an appetite and has no worsening nausea or abdominal pain with eating. Continues to have flatus but denies BM in the last 24 hours. She does report an intermittent cough. Denies fever, body aches, or chills.  Objective: Vital signs in last 24 hours: Temp:  [97.5 F (36.4 C)-98.7 F (37.1 C)] 97.9 F (36.6 C) (09/08 0400) Pulse Rate:  [83-100] 86 (09/08 0700) Resp:  [15-22] 16 (09/08 0600) BP: (115-173)/(45-104) 133/45 (09/08 0700) SpO2:  [93 %-99 %] 95 % (09/08 0700) Weight:  [102.7 kg] 102.7 kg (09/08 0500) Last BM Date: 09/05/20  Intake/Output from previous day: 09/07 0701 - 09/08 0700 In: 636.1 [P.O.:360; I.V.:126.1; IV Piggyback:150] Out: 420 [Urine:420]  General appearance:alert, cooperative and no distress Resp:normal work of breathing, normal breath sounds bilaterally WE:XHBZ, nondistended, appropriately tender, midline incision with honeycomb dressing, no erythema or drainage, betadine staining Extremities: trace pitting edema lower extremities, edema bilateral distal upper extremities Neurological: A&Ox3  Lab Results:  Recent Labs    09/05/20 0506 09/07/20 0521  WBC 9.3 23.1*  HGB 9.3* 9.0*  HCT 27.9* 28.1*  PLT 124* 190   BMET Recent Labs    09/06/20 0331 09/07/20 0521  NA 143 143  K 3.4* 3.8  CL 106 104  CO2 22 23  GLUCOSE 110* 121*  BUN 74* 84*  CREATININE 4.33* 4.45*  CALCIUM 7.7* 8.3*   Anti-infectives: Anti-infectives (From admission, onward)   Start     Dose/Rate Route Frequency Ordered Stop   09/03/20 2200  piperacillin-tazobactam (ZOSYN) IVPB 3.375 g        3.375 g 12.5 mL/hr over 240 Minutes Intravenous Every 12 hours 09/03/20 1057     09/02/20 1800  piperacillin-tazobactam (ZOSYN) IVPB 3.375 g   Status:  Discontinued        3.375 g 12.5 mL/hr over 240 Minutes Intravenous Every 8 hours 09/02/20 1103 09/03/20 1057   09/02/20 0945  piperacillin-tazobactam (ZOSYN) IVPB 3.375 g        3.375 g 100 mL/hr over 30 Minutes Intravenous  Once 09/02/20 0930 09/02/20 1028   08/16/2020 0630  ertapenem (INVANZ) 1,000 mg in sodium chloride 0.9 % 100 mL IVPB        1 g 200 mL/hr over 30 Minutes Intravenous On call to O.R. 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 She is improved this morning in regards to her abdominal pain and mentation/orientation. Hemodynamically stable and afebrile overnight. H&H relatively unchanged. Albumin remains low at 1.3, will provide Boost to supplement small amount of PO intake. Total of 420 mL of urine yesterday without lasix, Cr continues to slowly rise to 4.45 today from 4.33. BUN elevated at 84 from 74. 1L LR per nephrology and reassess in AM, appreciate their recommendations. WBC increased today to 23.1 from 9.3 yesterday. Unclear etiology at this time, will order CT abdomen, place PICC line, and remove central line. Pt currently on broad spectrum abx therapy with Zosyn. Will continue to encourage PO intake and physical activity through PT. -full liquid diet, Boost for nutrition supplementation -IS, OOB, PT -SCDs, Heparin SQ -cont Zosyn, Day 6 -1L  LR over 10 hrs, per nephrology -PICC line ordered, OK by nephrology   LOS: 8 days    [name] 09/07/2020

## 2020-09-07 NOTE — Progress Notes (Signed)
Physical Therapy Treatment Patient Details Name: [name] MRN: [id number] DOB: 1941/08/30 Today's Date: 09/07/2020    History of Present Illness [name] is a 79 y.o. 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  Patient was unable to provide history due to being somnolent, though easily aroused, history was obtained from daughter by phone and ED physician and ED chart.  Per report, patient was discharged home from a nursing facility 4 days ago, she lives alone (at baseline, she is able to take care of most of her ADLs and maintain normal conversation and daughter checks on her regularly), but patient has been feeling weak since past 3 days, she has had poor appetite and has been more somnolent and drowsy at home.  Daughter activated EMS and she was sent to the ED for further evaluation and management.  Patient was seen by her PCP on 8/12 during which she complained of ongoing rectal bleeding (patient has a pending GI follow-up).  She was admitted to this facility from 7/4-7/8 due to acute blood loss anemia and recurrent C. difficile colitis.    PT Comments    Patient presents alert, but slightly lethargic requiring frequent repeated verbal/tactile cueing to participate with therapy.  Patient has difficulty moving BUE/LE during bed mobility due to weakness, frequent loss of sitting balance while seated at EOB, unable to lift extremities against gravity due to weakness and required Max/total assist to reposition when put back to bed.  Patient will benefit from continued physical therapy in hospital and recommended venue below to increase strength, balance, endurance for safe ADLs and gait.    Follow Up Recommendations  SNF     Equipment Recommendations   None recommended by PT    Recommendations for Other Services       Precautions / Restrictions Precautions Precautions: Fall Restrictions Weight Bearing Restrictions: No    Mobility  Bed Mobility Overal bed mobility: Needs Assistance Bed Mobility: Supine to Sit;Sit to Supine     Supine to sit: Max assist Sit to supine: Max assist   General bed mobility comments: demonstrates poor carryover for using BUE to help pull self to sitting, able to maintain sitting balance when leaning on left elbow  Transfers                    Ambulation/Gait                 Stairs             Wheelchair Mobility    Modified Rankin (Stroke Patients Only)       Balance Overall balance assessment: Needs assistance Sitting-balance support: Feet supported;No upper extremity supported Sitting balance-Leahy Scale: Poor Sitting balance - Comments: seated at EOB                                    Cognition Arousal/Alertness: Awake/alert Behavior During Therapy: WFL for tasks assessed/performed Overall Cognitive Status: Within Functional Limits for tasks assessed                                 General Comments: slightly flat affect      Exercises General Exercises -  Lower Extremity Long Arc Quad: Seated;AAROM;Strengthening;Both;10 reps Hip Flexion/Marching: Seated;AAROM;Strengthening;Both;10 reps Heel Raises: Seated;AROM;Strengthening;Both;10 reps    General Comments        Pertinent Vitals/Pain Pain Assessment: Faces Faces Pain Scale: Hurts a little bit Pain Location: abdomen Pain Descriptors / Indicators: Discomfort;Sore Pain Intervention(s): Limited activity within patient's tolerance;Monitored during session;Repositioned    Home Living                      Prior Function            PT Goals (current goals can now be found in the care plan section) Acute Rehab PT Goals Patient Stated Goal: Return home PT Goal  Formulation: With patient Time For Goal Achievement: 09/20/20 Potential to Achieve Goals: Fair Progress towards PT goals: Progressing toward goals    Frequency    Min 3X/week      PT Plan Current plan remains appropriate;Other (comment)    Co-evaluation              AM-PAC PT "6 Clicks" Mobility   Outcome Measure  Help needed turning from your back to your side while in a flat bed without using bedrails?: A Lot Help needed moving from lying on your back to sitting on the side of a flat bed without using bedrails?: A Lot Help needed moving to and from a bed to a chair (including a wheelchair)?: Total Help needed standing up from a chair using your arms (e.g., wheelchair or bedside chair)?: Total Help needed to walk in hospital room?: Total Help needed climbing 3-5 steps with a railing? : Total 6 Click Score: 8    End of Session   Activity Tolerance: Patient tolerated treatment well;Patient limited by fatigue Patient left: in bed;with call bell/phone within reach Nurse Communication: Mobility status PT Visit Diagnosis: Unsteadiness on feet (R26.81);Other abnormalities of gait and mobility (R26.89);Muscle weakness (generalized) (M62.81);Pain     Time: 1127-1150 PT Time Calculation (min) (ACUTE ONLY): 23 min  Charges:  $Therapeutic Activity: 23-37 mins                     2:21 PM, 09/07/20 Lonell Grandchild, MPT Physical Therapist with [facility] [phone] office [phone] mobile phone

## 2020-09-07 NOTE — Progress Notes (Signed)
Per Dr Candiss Norse. Okay to place PICC/Midline. Midline placed in the LUA Brachial vein without difficulty.

## 2020-09-07 NOTE — Progress Notes (Signed)
Initial Nutrition Assessment  DOCUMENTATION CODES:   Obesity unspecified  INTERVENTION:  Encouraged po intake of meals and supplements  Recommend Ensure Enlive po BID, each supplement provides 350 kcal and 20 grams of protein  Recommend Vit C 500 mg po BID to support post-op healing  Recommend consideration of nutrition support as pt as been without adequate nutrition since admit   NUTRITION DIAGNOSIS:   Inadequate oral intake related to poor appetite as evidenced by meal completion < 25% (on FLD).  GOAL:   Provide needs based on ASPEN/SCCM guidelines  MONITOR:   Diet advancement, I & O's, PO intake, Weight trends, Labs  REASON FOR ASSESSMENT:   LOS    ASSESSMENT:  79 year old female with history significant of CKD stage III, COPD, GERD, anemia of chronic disease, small bowel AVMs, HTN, HLD, DM2, myasthenia gravis, Schatzki's ring s/p dilation x 3 admitted with obstructing sigmoid colon mass.  Patient is POD#8 s/p left hemicolectomy   She developed oliguric AKI secondary to acute tubal necrosis in the setting of acute blood loss anemia and hypotension. No evidence of obstruction on renal ultrasound, nephrology following. Diet history reviewed, pt consumed 5% of CL dinner on 9/01, then pt was NPO from midnight on 9/01, on 9/06 pt diet advanced to CL, then FLD on 9/07. Per flowsheets, she only consumed 10% of breakfast and lunch meals today. Boost Breeze TID ordered on 9/07 which provides 250 kcal and 9 grams of protein per supplement (750 kcal and 27 grams of protein with 100% po TID) Met with pt bedside, opened orange Boost Breeze and grape juice on bedside tray. Pt alert however appears fatigued/weak, offering minimal engagement during conversation. Patient denied nausea, vomiting, diarrhea, shook her head no when asked if she was drinking Boost. RD encouraged po intake, and discussed the importance of nutrition for post-op healing. Patient endorses trying Ensure supplement at  [facility] [place] and likes the chocolate flavor, no additional nutrition history able to be obtained at this time. Patient reports that she is thirsty, RD encouraged Boost however, pt only interested in drinking grape juice. RD raised head of bed and assisted pt with drinking her juice. She has been without adequate nutrition for the last 8 days, suspect even longer given history and recent weight trends. She is at high risk for malnutrition, recommend consideration of nutrition support.  Admit wt 102.5 kg       Current wt 102.7 kg Actual weight masked by fluid, noted +3 generalized edema per RN assessment. Per chart, weights have trended down ~19 lbs (7.9%) in the last 2 months; significant.   I/Os: +2185.5 ml since admit UOP: 420 ml x 24 hrs  Medications reviewed and include: Celexa, Cardizem,  Boost Breeze, Gabapentin, SSI, MVI, Prednisone IVPB: Zosyn IVF: Lactated ringers Labs: CBGs 104,115,112,134,158,152 x 24 hrs, BUN 84 (H), Cr 4.45 (H), P 6.0 (H) trending up, WBC 23.1 (H), Hgb 9.0 (L), HCT 28.1 (L), K 3.8 (WNL) trended up   NUTRITION - FOCUSED PHYSICAL EXAM: Unable to complete full exam at this time, transporting to CT Moderate fat depletion to orbital, buccal region Moderate muscle depletion to temple region  Diet Order:   Diet Order            Diet full liquid Room service appropriate? Yes; Fluid consistency: Thin  Diet effective now                 EDUCATION NEEDS:   Education needs have been addressed  Skin:  Skin Assessment: Skin Integrity Issues: Skin Integrity Issues:: Incisions Incisions: closed;abdomen (8/31); skin tear; right wrist  Last BM:  9/8 type 7  Height:   Ht Readings from Last 1 Encounters:  08/29/2020 6' (1.829 m)    Weight:   Wt Readings from Last 1 Encounters:  09/07/20 102.7 kg    Ideal Body Weight:  72.3 kg  BMI:  Body mass index is 30.71 kg/m.  Estimated Nutritional Needs:   Kcal:  2118-2280 (MSJ x 1.3-1.4)  Protein:  116-131  (1.6-1.8 g/kg/IBW)  Fluid:  >/= 2 L  Lajuan Lines, RD, LDN Clinical Nutrition After Hours/Weekend Pager # in [place]

## 2020-09-08 LAB — RENAL FUNCTION PANEL
Albumin: 1.1 g/dL — ABNORMAL LOW (ref 3.5–5.0)
BUN: 92 mg/dL — ABNORMAL HIGH (ref 8–23)
CO2: 11 mmol/L — ABNORMAL LOW (ref 22–32)
Calcium: 8.8 mg/dL — ABNORMAL LOW (ref 8.9–10.3)
Chloride: 105 mmol/L (ref 98–111)
Creatinine, Ser: 5.19 mg/dL — ABNORMAL HIGH (ref 0.44–1.00)
GFR calc Af Amer: 9 mL/min — ABNORMAL LOW
GFR calc non Af Amer: 7 mL/min — ABNORMAL LOW
Glucose, Bld: <20 mg/dL — CL (ref 70–99)
Phosphorus: 12.1 mg/dL — ABNORMAL HIGH (ref 2.5–4.6)
Potassium: 7.3 mmol/L (ref 3.5–5.1)
Sodium: 142 mmol/L (ref 135–145)

## 2020-09-08 LAB — CBC
HCT: 30.9 % — ABNORMAL LOW (ref 36.0–46.0)
Hemoglobin: 9.1 g/dL — ABNORMAL LOW (ref 12.0–15.0)
MCH: 28.6 pg (ref 26.0–34.0)
MCHC: 29.4 g/dL — ABNORMAL LOW (ref 30.0–36.0)
MCV: 97.2 fL (ref 80.0–100.0)
Platelets: 249 10*3/uL (ref 150–400)
RBC: 3.18 MIL/uL — ABNORMAL LOW (ref 3.87–5.11)
RDW: 18.6 % — ABNORMAL HIGH (ref 11.5–15.5)
WBC: 34.4 10*3/uL — ABNORMAL HIGH (ref 4.0–10.5)
nRBC: 1.3 % — ABNORMAL HIGH (ref 0.0–0.2)

## 2020-09-08 LAB — LACTIC ACID, PLASMA: Lactic Acid, Venous: >11 mmol/L (ref 0.5–1.9)

## 2020-09-08 MED ORDER — NOREPINEPHRINE 16 MG/250ML-% IV SOLN
0.0000 ug/min | INTRAVENOUS | Status: DC
  Administered 2020-09-08: 23 ug/min via INTRAVENOUS
  Filled 2020-09-08: qty 250

## 2020-09-09 ENCOUNTER — Ambulatory Visit: Payer: Medicare Other | Admitting: Gastroenterology

## 2020-09-11 DIAGNOSIS — G7000 Myasthenia gravis without (acute) exacerbation: Secondary | ICD-10-CM | POA: Diagnosis not present

## 2020-09-11 DIAGNOSIS — M1710 Unilateral primary osteoarthritis, unspecified knee: Secondary | ICD-10-CM | POA: Diagnosis not present

## 2020-09-11 DIAGNOSIS — I129 Hypertensive chronic kidney disease with stage 1 through stage 4 chronic kidney disease, or unspecified chronic kidney disease: Secondary | ICD-10-CM | POA: Diagnosis not present

## 2020-09-13 ENCOUNTER — Ambulatory Visit (HOSPITAL_COMMUNITY): Payer: Medicare Other | Admitting: Hematology

## 2020-09-20 MED FILL — Medication: Qty: 1 | Status: AC

## 2020-09-21 ENCOUNTER — Ambulatory Visit: Payer: Medicare Other | Admitting: Gastroenterology

## 2020-09-28 ENCOUNTER — Ambulatory Visit: Payer: Medicare Other | Admitting: Family Medicine

## 2020-09-30 NOTE — Death Summary Note (Addendum)
DEATH SUMMARY   Patient Details  Name: [name] MRN: [id number] DOB: [birth year]  Admission/Discharge Information   Admit Date:  09/16/20  Date of Death:    Time of Death:    Length of Stay: March 26, 2023  Referring Physician: Fayrene Helper, MD   Reason(s) for Hospitalization  Near obstructing colon mass, status post left hemicolectomy  Diagnoses  Preliminary cause of death: Acute renal failure, cardiac arrest, multiple organ failure Secondary Diagnoses (including complications and co-morbidities): Hypoalbuminemia, hyperkalemia Active Problems:   Mass of colon   Difficult intravenous access   Colonic mass   Brief Hospital Course (including significant findings, care, treatment, and services provided and events leading to death)  [name] is a 79 y.o. year old female who underwent a left hemicolectomy on 2020/09/16 for a near obstructing descending colon mass.  The patient did receive 2 units of packed red blood cells intraoperatively due to acute surgical blood loss as well as chronic anemia.  Immediately postoperatively, the patient required blood pressure support with Levophed.  She did awaken after the surgery, though her mental status waxed and waned.  Final pathology revealed diffuse colitis.  No malignancy was seen.  Due to worsening oliguric renal insufficiency and developing anasarca, nephrology was consulted.  Over the course of time, his urine output did improve with Lasix and albumin, but her uremia worsened.  On 09/07/2020, she was noted to have a worsening leukocytosis and a CT scan of the abdomen was performed which revealed the possible early breakdown of her anastomosis.  There was some fluid in the abdominal cavity.  This was going to be addressed by interventional radiology.  The patient was made n.p.o. and was going to be started on TPN.  Over the evening, she required increasing doses of Levophed.  Early this morning, the nurse noted that no blood pressure could  be obtained and her pupils were fixed and dilated.  A code was called but was unsuccessful.  The patient expired on 09-25-20 at 5:20 AM.  Patient's daughter was at the hospital.  I did review the patient's care with the daughter.  All questions were answered.    Pertinent Labs and Studies  Significant Diagnostic Studies CT ABDOMEN PELVIS WO CONTRAST  Addendum Date: 09/07/2020   ADDENDUM REPORT: 09/07/2020 15:47 ADDENDUM: These findings were also communicated to Dr. Arnoldo Morale at 1544 hours on 09/07/2020 Electronically Signed   By: Inez Catalina M.D.   On: 09/07/2020 15:47   Result Date: 09/07/2020 CLINICAL DATA:  Elevated white blood cell count with increased weakness EXAM: CT ABDOMEN AND PELVIS WITHOUT CONTRAST TECHNIQUE: Multidetector CT imaging of the abdomen and pelvis was performed following the standard protocol without IV contrast. COMPARISON:  08/12/2020 FINDINGS: Lower chest: Bilateral lower lobe consolidation is noted consistent with multifocal pneumonia. No associated effusion is seen. Hepatobiliary: No focal liver abnormality is seen. Status post cholecystectomy. No biliary dilatation. Pancreas: Unremarkable. No pancreatic ductal dilatation or surrounding inflammatory changes. Spleen: Normal in size without focal abnormality. Adrenals/Urinary Tract: Kidneys are well visualized with stable cysts bilaterally. No obstructive changes are seen. Calcification is noted within the cortex on the right stable from the prior exam. The bladder is decompressed by Foley catheter. Stomach/Bowel: There are changes consistent with the recent partial colectomy with an anastomotic site identified in the pelvis joining the transverse and sigmoid colons. Adjacent to this best seen on image number 67 of series 2 is a irregular air collection identified with findings suspicious of breakdown of the  sigmoid at the area of prior colectomy. The air collection measures approximately 6.7 x 4.1 cm in greatest transverse and AP  dimensions. A considerable amount of free air is noted within the abdomen although some of this may be related to the recent surgery. The stomach is well distended with fluid and there is evidence of gastroesophageal reflux. Additionally some the small-bowel loops are mildly prominent likely related to irritation from the changes in the pelvis. Free fluid with air within is noted within the pelvis as well. This is felt to be greater than that expected for the postoperative state and given the areas of air within is suspicious for spillage of bowel contents. Vascular/Lymphatic: Aortic atherosclerosis. No enlarged abdominal or pelvic lymph nodes. Reproductive: Status post hysterectomy. No adnexal masses. Other: Considerable free fluid is noted within the pelvis with multiple flecks of air within suspicious for spillage of bowel contents. Musculoskeletal: No acute bony abnormality is noted. IMPRESSION: Changes consistent with the known side to side transverse colon to sigmoid colon anastomosis and left colectomy are seen. At the level of the sigmoid stump there are findings suspicious for breakdown of the staple line with an irregular air collection adjacent to the sigmoid stump as well as a considerable amount of free fluid and free air within the abdomen greater than that expected for the postoperative state. This is suspicious for breakdown of the staple line and leakage of fecal material into the abdomen. Bilateral lower lobe consolidation consistent with multifocal pneumonia. Some mildly prominent loops of small bowel are noted which may be reactive from the fluid and air within the abdomen. Critical Value/emergent results were called by telephone at the time of interpretation on 09/07/2020 at 3:27 pm to [place], the pts nurse who will relay the report to Dr Arnoldo Morale. Electronically Signed: By: Inez Catalina M.D. On: 09/07/2020 15:35   DG Chest 1 View  Result Date: 08/22/2020 CLINICAL DATA:  Central line  placement in OR EXAM: CHEST  1 VIEW COMPARISON:  Portable exam 1018 hours compared to 08/19/2020 FINDINGS: Tip of endotracheal tube 8 mm from carina. LEFT subclavian central venous catheter with tip projecting over SVC at azygous vein confluence. LEFT lateral costal margin excluded, not repeated at ordering physician request. Normal heart size, mediastinal contours, and pulmonary vascularity for technique. Atherosclerotic calcification aorta. Peribronchial thickening and scattered perihilar opacities which could reflect atelectasis or subtle edema. No pleural effusion or pneumothorax. IMPRESSION: Tip of LEFT subclavian line projects over SVC at the level of the azygos vein confluence. No pneumothorax following central line placement. Tip of endotracheal tube 8 mm from carina, recommend withdrawal 1-2 cm. Findings called to [facility] OR 4 on 08/29/2020 at 1031 hours. Electronically Signed   By: Lavonia Dana M.D.   On: 08/26/2020 10:32   DG Lumbar Spine Complete  Result Date: 08/19/2020 CLINICAL DATA:  Status post fall. EXAM: LUMBAR SPINE - COMPLETE 4+ VIEW COMPARISON:  None. FINDINGS: There is no evidence of an acute lumbar spine fracture. Approximately 3 mm anterolisthesis of the L5 vertebral body on S1 is noted. Moderate severity endplate sclerosis is seen at the levels of L1-L2 and L2-L3. Mild endplate sclerosis is noted throughout the remainder of the lumbar spine. There is moderate to marked severity intervertebral disc space narrowing at the level of L2-L3. IMPRESSION: 1. Approximately 3 mm anterolisthesis of the L5 vertebral body on S1. 2. Moderate to marked severity degenerative disc disease at L2-L3. Electronically Signed   By: Virgina Norfolk M.D.   On:  08/19/2020 20:34   CT Head Wo Contrast  Result Date: 08/19/2020 CLINICAL DATA:  Found after fall, unknown down time EXAM: CT HEAD WITHOUT CONTRAST TECHNIQUE: Contiguous axial images were obtained from the base of the skull through the vertex without  intravenous contrast. COMPARISON:  CT 08/12/2020 FINDINGS: Brain: No evidence of acute infarction, hemorrhage, hydrocephalus, extra-axial collection or mass lesion/mass effect. Symmetric prominence of the ventricles, cisterns and sulci compatible with parenchymal volume loss. Patchy areas of white matter hypoattenuation are most compatible with chronic microvascular angiopathy. Vascular: Atherosclerotic calcification of the carotid siphons and intradural left vertebral artery. No hyperdense vessel. Skull: No calvarial fracture or suspicious osseous lesion. No scalp swelling or hematoma. Mild hyperostosis frontalis interna, a benign often senescent finding. Sinuses/Orbits: Air-fluid level in the left maxillary sinus. Trace left mastoid effusion. Remaining paranasal sinuses and mastoid air cells are predominantly clear. Orbital structures are unremarkable aside from prior lens extractions. Other: None IMPRESSION: 1. No acute intracranial abnormality. No scalp swelling or calvarial fracture. 2. Chronic microvascular angiopathy and parenchymal volume loss. 3. Air-fluid level in the left maxillary sinus. Correlate for acute sinusitis. 4. Trace left mastoid effusion. Electronically Signed   By: Lovena Le M.D.   On: 08/19/2020 20:15   CT Head Wo Contrast  Result Date: 08/12/2020 CLINICAL DATA:  Mental status change, unknown cause. Additional history provided: Confusion. EXAM: CT HEAD WITHOUT CONTRAST TECHNIQUE: Contiguous axial images were obtained from the base of the skull through the vertex without intravenous contrast. COMPARISON:  Brain MRI 05/18/2013 FINDINGS: Brain: Mild generalized parenchymal atrophy. Mild ill-defined hypoattenuation within the cerebral white matter is nonspecific, but consistent with chronic small vessel ischemic disease. Findings are similar as compared to the MRI of 05/18/2013. There is no acute intracranial hemorrhage. No demarcated cortical infarct is identified. No extra-axial fluid  collection. No evidence of intracranial mass. No midline shift. Partially empty sella turcica. Vascular: No hyperdense vessel.  Atherosclerotic calcifications. Skull: Normal. Negative for fracture or focal lesion. Sinuses/Orbits: Visualized orbits show no acute finding. Sphenoid sinus mucosal thickening, moderate on the right, mild on the left). Bilateral mastoid effusions. IMPRESSION: No CT evidence of acute intracranial abnormality. Mild generalized parenchymal atrophy and chronic small vessel ischemic disease, without appreciable change as compared to the MRI of 05/18/2013. Sphenoid sinusitis. Bilateral mastoid effusions. Electronically Signed   By: Kellie Simmering DO   On: 08/12/2020 18:07   CT ABDOMEN PELVIS W CONTRAST  Result Date: 08/12/2020 CLINICAL DATA:  79 year old female with abdominal pain and fever. EXAM: CT ABDOMEN AND PELVIS WITH CONTRAST TECHNIQUE: Multidetector CT imaging of the abdomen and pelvis was performed using the standard protocol following bolus administration of intravenous contrast. CONTRAST:  159mL OMNIPAQUE IOHEXOL 300 MG/ML  SOLN COMPARISON:  CT abdomen pelvis dated 06/12/2020. FINDINGS: Lower chest: There are bibasilar dependent atelectatic changes. There is mild cardiomegaly. Three vessel coronary vascular calcification. There is no intra-abdominal free air or free fluid. Hepatobiliary: Probable mild fatty liver. No intrahepatic biliary ductal dilatation. Cholecystectomy. No retained calcified stone noted in the central CBD. Pancreas: Unremarkable. No pancreatic ductal dilatation or surrounding inflammatory changes. Spleen: Normal in size without focal abnormality. Adrenals/Urinary Tract: The adrenal glands unremarkable. There is mild bilateral renal parenchyma atrophy and cortical irregularity and scarring. Small bilateral renal cysts and subcentimeter hypodensities which are too small to characterize. There is no hydronephrosis on either side. There is symmetric enhancement and  excretion of contrast by both kidneys. The visualized ureters and urinary bladder appear unremarkable. Stomach/Bowel: There is diffuse diverticulosis  of the descending and sigmoid colon. There is long segment circumferential thickening of the descending and sigmoid colon with stranding of the adjacent fat which may be inflammatory/infectious in etiology. A colonic mass is not excluded. Overall similar or slight progression of the inflammatory changes compared to the prior CT. Multiple scattered rounded lymph nodes again seen along this segment of the colon as seen on the prior CT. There is a small hiatal hernia. Moderate stool noted throughout the colon. No bowel obstruction. Appendectomy. Vascular/Lymphatic: Moderate aortoiliac atherosclerotic disease. The IVC is unremarkable. No portal venous gas. There is no adenopathy. Reproductive: Hysterectomy. There is a 2.5 cm right adnexal cyst. Other: None Musculoskeletal: Degenerative changes of the spine. No acute osseous pathology. IMPRESSION: 1. Distal colonic diverticulosis with long segment circumferential thickening of the descending and sigmoid colon. Overall similar or slightly progression of inflammatory changes compared to the prior CT. Findings may the inflammatory/infectious in etiology. Underlying colonic mass is not excluded. Clinical correlation and further evaluation with colonoscopy on a nonemergent/outpatient basis and following resolution of acute symptoms recommended. 2. A 2.5 cm right adnexal cyst. Further evaluation with pelvic ultrasound on a nonemergent basis recommended. 3. Aortic Atherosclerosis (ICD10-I70.0). Electronically Signed   By: Anner Crete M.D.   On: 08/12/2020 23:33   US RENAL  Result Date: 09/01/2020 CLINICAL DATA:  Acute renal insufficiency/chronic renal disease stage III B. EXAM: RENAL / URINARY TRACT ULTRASOUND COMPLETE COMPARISON:  None. FINDINGS: Right Kidney: Renal measurements: 8.6 cm x 4.5 cm x 5.0 cm = volume: 99.15  mL. Echogenicity within normal limits. 1.0 cm x 0.9 cm x 1.0 cm and 1.8 cm x 1.8 cm x 2.3 cm anechoic structures are seen within the mid and upper right kidney. No hydronephrosis visualized. Left Kidney: Renal measurements: 10.5 cm x 6.4 cm x 5.7 cm = volume: 201.2 mL. Echogenicity within normal limits. A 1.7 cm x 1.7 cm x 2.3 cm anechoic structure is seen within the mid left kidney. No hydronephrosis visualized. Bladder: A Foley catheter is within the urinary bladder. A 3.3 cm x 3.1 cm x 2.1 cm heterogeneous mildly echogenic focus is seen within the lumen of the urinary bladder. Other: None. IMPRESSION: 1. Findings which may represent a moderate amount of blood products within the urinary bladder. Additional evaluation with abdomen and pelvis CT is recommended. 2. Bilateral renal cysts. Electronically Signed   By: Virgina Norfolk M.D.   On: 09/01/2020 17:07   DG Chest Port 1 View  Result Date: 09/02/2020 CLINICAL DATA:  Increased shortness of breath. History of pneumonia. EXAM: PORTABLE CHEST 1 VIEW COMPARISON:  08/02/2020. FINDINGS: Interim removal of endotracheal tube. Left subclavian line stable position. Small catheter noted over the left neck. Stable cardiomegaly. Progressive bibasilar atelectasis/infiltrates. No pleural effusion. No pneumothorax. IMPRESSION: 1. Interim removal endotracheal tube. Left subclavian line in stable position. Small catheter noted over the left neck. 2.  Stable cardiomegaly. 3.  Progressive bibasilar atelectasis/infiltrates.  No pneumothorax. Electronically Signed   By: Marcello Moores  Register   On: 09/02/2020 07:43   DG Chest Portable 1 View  Result Date: 08/19/2020 CLINICAL DATA:  Found down EXAM: PORTABLE CHEST 1 VIEW COMPARISON:  08/12/2020 FINDINGS: The heart size and mediastinal contours are within normal limits. Both lungs are clear. The visualized skeletal structures are unremarkable. IMPRESSION: No active disease. Electronically Signed   By: Randa Ngo M.D.   On:  08/19/2020 18:42   DG Chest Portable 1 View  Result Date: 08/12/2020 CLINICAL DATA:  Infection workup EXAM: PORTABLE  CHEST 1 VIEW COMPARISON:  04/29/2020 FINDINGS: Cardiomegaly. No confluent airspace opacities, effusions or edema. No acute bony abnormality. IMPRESSION: Cardiomegaly.  No active disease. Electronically Signed   By: Rolm Baptise M.D.   On: 08/12/2020 18:28   DG Chest Port 1V same Day  Result Date: 09/02/2020 CLINICAL DATA:  NG placement EXAM: PORTABLE CHEST 1 VIEW COMPARISON:  09/02/2020 FINDINGS: NG tube in the right lower lobe bronchus has been removed. NG tube is now in the stomach with the tip in the body of the stomach. Left subclavian central venous catheter tip in the SVC unchanged Hypoventilation. Progression of right lower lobe atelectasis/infiltrate. Mild left lower lobe airspace disease unchanged. Small right effusion. IMPRESSION: NG tube in the stomach with the tip in the body of the stomach Progression of right lower lobe airspace disease. Electronically Signed   By: Franchot Gallo M.D.   On: 09/02/2020 13:52   DG Chest Port 1V same Day  Result Date: 09/02/2020 CLINICAL DATA:  NG placement EXAM: PORTABLE CHEST 1 VIEW COMPARISON:  09/02/2020 FINDINGS: Interval placement of NG tube. The NG tube is in the right lower bronchus with the side hole in the distal trachea. There is increased right lower lobe atelectasis. Mild left lower lobe atelectasis No heart failure or effusion.  No pneumothorax Left subclavian central venous catheter tip in the proximal SVC is unchanged. IMPRESSION: NG tube in the right lower lobe bronchus.  Recommend removal. These results were called by telephone at the time of interpretation on 09/02/2020 at 1:17 pm to provider [facility], who verbally acknowledged these results. Electronically Signed   By: Franchot Gallo M.D.   On: 09/02/2020 13:17   ECHOCARDIOGRAM COMPLETE  Result Date: 09/02/2020    ECHOCARDIOGRAM REPORT   Patient Name:   [name] Date of  Exam: 09/02/2020 Medical Rec #:  [id number]        Height:       72.0 in Accession #:    [id number]       Weight:       249.6 lb Date of Birth:  01-Nov-1941        BSA:          2.341 m Patient Age:    38 years         BP:           103/71 mmHg Patient Gender: F                HR:           85 bpm. Exam Location:  [facility] Procedure: 2D Echo Indications:    Congestive Heart Failure 428.0 / I50.9  History:        Patient has prior history of Echocardiogram examinations, most                 recent 03/22/2020. COPD; Risk Factors:Former Smoker,                 Hypertension, Dyslipidemia and Diabetes. Colonic mass.  Sonographer:    Leavy Cella RDCS (AE) Referring Phys: [id number]  1. Left ventricular ejection fraction, by estimation, is 55 to 60%. The left ventricle has normal function. The left ventricle has no regional wall motion abnormalities. There is mild left ventricular hypertrophy. Left ventricular diastolic parameters are consistent with Grade I diastolic dysfunction (impaired relaxation).  2. Right ventricular systolic function is normal. The right ventricular size is normal. There is mildly elevated pulmonary artery systolic pressure.  3. The mitral valve is normal in structure. Mild mitral valve regurgitation. No evidence of mitral stenosis.  4. The aortic valve is tricuspid. Aortic valve regurgitation is not visualized. No aortic stenosis is present. FINDINGS  Left Ventricle: Left ventricular ejection fraction, by estimation, is 55 to 60%. The left ventricle has normal function. The left ventricle has no regional wall motion abnormalities. The left ventricular internal cavity size was normal in size. There is  mild left ventricular hypertrophy. Left ventricular diastolic parameters are consistent with Grade I diastolic dysfunction (impaired relaxation). Normal left ventricular filling pressure. Right Ventricle: The right ventricular size is normal. No increase in right ventricular wall  thickness. Right ventricular systolic function is normal. There is mildly elevated pulmonary artery systolic pressure. The tricuspid regurgitant velocity is 2.85  m/s, and with an assumed right atrial pressure of 10 mmHg, the estimated right ventricular systolic pressure is 76.7 mmHg. Left Atrium: Left atrial size was normal in size. Right Atrium: Right atrial size was normal in size. Pericardium: There is no evidence of pericardial effusion. Mitral Valve: The mitral valve is normal in structure. There is mild thickening of the mitral valve leaflet(s). There is mild calcification of the mitral valve leaflet(s). Mild mitral annular calcification. Mild mitral valve regurgitation. No evidence of  mitral valve stenosis. Tricuspid Valve: The tricuspid valve is normal in structure. Tricuspid valve regurgitation is not demonstrated. No evidence of tricuspid stenosis. Aortic Valve: The aortic valve is tricuspid. Aortic valve regurgitation is not visualized. No aortic stenosis is present. Aortic valve mean gradient measures 5.5 mmHg. Aortic valve peak gradient measures 13.3 mmHg. Aortic valve area, by VTI measures 2.39  cm. Pulmonic Valve: The pulmonic valve was not well visualized. Pulmonic valve regurgitation is not visualized. No evidence of pulmonic stenosis. Aorta: The aortic root is normal in size and structure. Pulmonary Artery: Indeterminant PASP, inadequate TR jet. Venous: The inferior vena cava was not well visualized. IAS/Shunts: The interatrial septum was not well visualized.  LEFT VENTRICLE PLAX 2D LVIDd:         3.78 cm  Diastology LVIDs:         2.79 cm  LV e' lateral:   7.83 cm/s LV PW:         1.24 cm  LV E/e' lateral: 8.6 LV IVS:        1.23 cm  LV e' medial:    5.55 cm/s LVOT diam:     2.00 cm  LV E/e' medial:  12.1 LV SV:         82 LV SV Index:   35 LVOT Area:     3.14 cm  RIGHT VENTRICLE RV S prime:     19.30 cm/s TAPSE (M-mode): 2.6 cm LEFT ATRIUM             Index       RIGHT ATRIUM           Index  LA diam:        3.50 cm 1.50 cm/m  RA Area:     10.40 cm LA Vol (A2C):   45.7 ml 19.52 ml/m RA Volume:   22.50 ml  9.61 ml/m LA Vol (A4C):   42.2 ml 18.03 ml/m LA Biplane Vol: 48.4 ml 20.68 ml/m  AORTIC VALVE AV Area (Vmax):    2.12 cm AV Area (Vmean):   2.04 cm AV Area (VTI):     2.39 cm AV Vmax:           182.19 cm/s  AV Vmean:          106.438 cm/s AV VTI:            0.345 m AV Peak Grad:      13.3 mmHg AV Mean Grad:      5.5 mmHg LVOT Vmax:         123.07 cm/s LVOT Vmean:        68.982 cm/s LVOT VTI:          0.262 m LVOT/AV VTI ratio: 0.76  AORTA Ao Root diam: 2.80 cm MITRAL VALVE                TRICUSPID VALVE MV Area (PHT): 3.68 cm     TR Peak grad:   32.5 mmHg MV Decel Time: 206 msec     TR Vmax:        285.00 cm/s MV E velocity: 67.30 cm/s MV A velocity: 147.00 cm/s  SHUNTS MV E/A ratio:  0.46         Systemic VTI:  0.26 m                             Systemic Diam: 2.00 cm Carlyle Dolly MD Electronically signed by Carlyle Dolly MD Signature Date/Time: 09/02/2020/3:46:15 PM    Final    [place] EKG SITE RITE  Result Date: 09/07/2020 If Site Rite image not attached, placement could not be confirmed due to current cardiac rhythm.  [place] EKG SITE RITE  Result Date: 08/13/2020 If Site Rite image not attached, placement could not be confirmed due to current cardiac rhythm.   Microbiology Recent Results (from the past 240 hour(s))  SARS Coronavirus 2 by RT PCR (hospital order, performed in [facility] hospital lab) Nasopharyngeal Nasopharyngeal Swab     Status: None   Collection Time: 08/15/2020  6:38 AM   Specimen: Nasopharyngeal Swab  Result Value Ref Range Status   SARS Coronavirus 2 NEGATIVE NEGATIVE Final    Comment: (NOTE) SARS-CoV-2 target nucleic acids are NOT DETECTED.  The SARS-CoV-2 RNA is generally detectable in upper and lower respiratory specimens during the acute phase of infection. The lowest concentration of SARS-CoV-2 viral copies this assay can detect is 250 copies / mL. A  negative result does not preclude SARS-CoV-2 infection and should not be used as the sole basis for treatment or other patient management decisions.  A negative result may occur with improper specimen collection / handling, submission of specimen other than nasopharyngeal swab, presence of viral mutation(s) within the areas targeted by this assay, and inadequate number of viral copies (<250 copies / mL). A negative result must be combined with clinical observations, patient history, and epidemiological information.  Fact Sheet for Patients:   StrictlyIdeas.no  Fact Sheet for Healthcare Providers: BankingDealers.co.za  This test is not yet approved or  cleared by the [place] FDA and has been authorized for detection and/or diagnosis of SARS-CoV-2 by FDA under an Emergency Use Authorization (EUA).  This EUA will remain in effect (meaning this test can be used) for the duration of the COVID-19 declaration under Section 564(b)(1) of the Act, 21 U.S.C. section 360bbb-3(b)(1), unless the authorization is terminated or revoked sooner.  Performed at [facility], [address], [place], [place] [address]   MRSA PCR Screening     Status: None   Collection Time: 08/11/2020  8:13 PM   Specimen: Nasal Mucosa; Nasopharyngeal  Result Value Ref Range Status   MRSA  by PCR NEGATIVE NEGATIVE Final    Comment:        The GeneXpert MRSA Assay (FDA approved for NASAL specimens only), is one component of a comprehensive MRSA colonization surveillance program. It is not intended to diagnose MRSA infection nor to guide or monitor treatment for MRSA infections. Performed at [facility], [address], [place], [place] [address]     Lab Basic Metabolic Panel: Recent Labs  Lab 09/02/20 [phone] 09/02/20 0557 09/03/20 0602 09/03/20 0602 09/04/20 0552 09/05/20 0506 09/06/20 0331 09/07/20 0521 2020-09-30 0507  NA 138   < > 142   < > 139 142 143  143 142  K 4.6   < > 3.8   < > 3.6 3.6 3.4* 3.8 7.3*  CL 111   < > 112*   < > 108 106 106 104 105  CO2 19*   < > 20*   < > 20* 21* 22 23 11*  GLUCOSE 338*   < > 82   < > 157* 137* 110* 121* <20*  BUN 41*   < > 54*   < > 62* 67* 74* 84* 92*  CREATININE 3.18*   < > 3.61*   < > 4.02* 4.24* 4.33* 4.45* 5.19*  CALCIUM 6.6*   < > 7.0*   < > 7.4* 7.9* 7.7* 8.3* 8.8*  MG 2.4  --  2.4  --   --   --   --   --   --   PHOS 4.2   < > 3.7   < > 4.9* 5.6* 5.0* 6.0* 12.1*   < > = values in this interval not displayed.   Liver Function Tests: Recent Labs  Lab 09/04/20 0552 09/05/20 0506 09/06/20 0331 09/07/20 0521 09/30/20 0507  ALBUMIN 1.5* 1.5* 1.2* 1.3* 1.1*   No results for input(s): LIPASE, AMYLASE in the last 168 hours. No results for input(s): AMMONIA in the last 168 hours. CBC: Recent Labs  Lab 09/01/20 1320 09/02/20 0557 09/03/20 0602 09/04/20 0552 09/05/20 0506 09/07/20 0521 2020-09-30 0507  WBC 26.4*   < > 21.5* 16.6* 9.3 23.1* 34.4*  NEUTROABS 23.8*  --   --  15.7* 8.4*  --   --   HGB 7.8*   < > 8.7* 8.6* 9.3* 9.0* 9.1*  HCT 24.8*   < > 25.4* 25.6* 27.9* 28.1* 30.9*  MCV 91.5   < > 85.8 85.9 85.3 86.7 97.2  PLT 203   < > 150 133* 124* 190 249   < > = values in this interval not displayed.   Cardiac Enzymes: Recent Labs  Lab 09/01/20 1320  CKTOTAL 115   Sepsis Labs: Recent Labs  Lab 09/04/20 0552 09/05/20 0506 09/07/20 0521 September 30, 2020 0507  WBC 16.6* 9.3 23.1* 34.4*  LATICACIDVEN  --   --   --  >11*    Procedures/Operations  Left hemicolectomy on 08/28/2020   Aviva Signs 09-30-20, 6:38 AM

## 2020-09-30 NOTE — Progress Notes (Signed)
Went to check on pt and attempt to get BP and draw blood from line for labs. Pt was non responsive with fixed and dilated pupils. Went to call Dr. Arnoldo Morale to let him know change of status and had the other nurses go to room to prepare to code pt. Pt went vfib to asystole and we began code at [place]. Code was called after 4 rounds of epi and CPR at 0520. Family was in building right after code called and did not want to see pt like that. Pt family waited to see Dr. Arnoldo Morale when he arrived.

## 2020-09-30 NOTE — Progress Notes (Signed)
CODE BLUE  CODE BLUE was called at 5 03 AM.  At bedside, the ICU nurses already started CPR.  ACLS was initiated, ED physician (Dr. Mariane Baumgarten) was also at bedside and helped with patient's intubation.  Effective CPR was performed and 4 rounds of epinephrine were given without patient having any pulse/ROSC.   At 5:20 AM, code was called off due to persistent unresponsiveness and asystolic rhythm with no pulse.  Patient's pupils were fixed and dilated. Patient recently underwent a left hemicolectomy on 8/31/2021due to colonic mass.  She subsequently developed renal failure and anasarca and was being followed by a nephrologist.  Family was notified and daughter presented to ICU.  Dr. Arnoldo Morale was already aware of patient status and was going to come to the hospital to see the patient's daughter in ICU.

## 2020-09-30 NOTE — ED Provider Notes (Signed)
[facility]  Department of Emergency Medicine   Code Blue CONSULT NOTE  Chief Complaint: Cardiac arrest/unresponsive   Level V Caveat: Unresponsive  History of present illness: I was contacted by the hospital for a CODE BLUE cardiac arrest upstairs and presented to the patient's bedside.  Nurses report that they checked on the patient and she was not responding.  She had stopped breathing and they could not find pulses, initiated CPR.  ROS: Unable to obtain, Level V caveat  Scheduled Meds: Continuous Infusions: PRN Meds:. Past Medical History:  Diagnosis Date  . Anemia in chronic renal disease 12/30/2015  . Anxiety   . Arthritis    "all over" (01/19/2014)  . Arthritis, lumbar spine   . Carpal tunnel syndrome   . Chronic bronchitis ([place])    "got it q yr for awhile; hasn't had it in awhile" (01/19/2014)  . Chronic kidney disease (CKD) stage G3b/A1, moderately decreased glomerular filtration rate (GFR) between 30-44 mL/min/1.73 square meter and albuminuria creatinine ratio less than 30 mg/g 09/14/2009   Qualifier: Diagnosis of  By: Moshe Cipro MD, Joycelyn Schmid    . Complication of anesthesia    combative  . COPD (chronic obstructive pulmonary disease) ([place])   . Depression   . Diverticulosis 07/2004   Colonscopy Dr Gala Romney  . Esophagitis, erosive 2009  . Exertional shortness of breath   . Gastroesophageal reflux   . TDDUKGUR(427.0)    "usually a couple times/wk" (01/19/2014)  . Heart murmur    saw cardiology In [place], he told her she did not need to come back.  . Hyperlipidemia   . Hypertension   . Impingement syndrome, shoulder   . Iron deficiency anemia   . Mitral regurgitation   . Myasthenia gravis   . Myasthenia gravis in remission ([place]) 11/24/2014  . Obesity   . OSA on CPAP    Negative on last sleep study  . Pneumonia 01/2012  . Pulmonary HTN ([place])   . Schatzki's ring    Last EGD w/ dilation 02/08/11, 2009 & 2007  . Seasonal allergies   . Shoulder pain   .  Thyroid disease    "used to take RX; they took me off it" (01/19/2014)  . Tobacco abuse   . Type II diabetes mellitus ([place])    Past Surgical History:  Procedure Laterality Date  . [place] VITRECTOMY WITH 20 GAUGE MVR PORT Left 01/19/2014   Procedure: 25 GAUGE PARS PLANA VITRECTOMY WITH 20 GAUGE MVR PORT; MEMBRAME PEEL; SERUM PATCH; LASER TREATMENT; C3F8;  Surgeon: Hayden Pedro, MD;  Location: [facility];  Service: Ophthalmology;  Laterality: Left;  . ABDOMINAL HYSTERECTOMY    . AGILE CAPSULE N/A 08/05/2019   Procedure: AGILE CAPSULE;  Surgeon: Daneil Dolin, MD;  [facility];  Service: Endoscopy;  Laterality: N/A;  7:30am  . APPENDECTOMY    . BIOPSY  08/18/2020   Procedure: BIOPSY;  Surgeon: Eloise Harman, DO;  [facility];  Service: Endoscopy;;  sigmoid colon  . CARPAL TUNNEL RELEASE Bilateral   . CATARACT EXTRACTION W/PHACO Left 10/21/2013   Procedure: LEFT CATARACT EXTRACTION PHACO AND INTRAOCULAR LENS PLACEMENT (IOC);  Surgeon: Marylynn Pearson, MD;  Location: [facility];  Service: Ophthalmology;  Laterality: Left;  . CENTRAL LINE INSERTION Left 08/04/2020   Procedure: CENTRAL LINE INSERTION;  Surgeon: Aviva Signs, MD;  Location[facility];  Service: General;  Laterality: Left;  1040  Dr. Thornton Papas Radiologist called and confirmed central line in correct  position reported to Dr. Arnoldo Morale   . CHOLECYSTECTOMY    . COLONOSCOPY  05/08/2012   JOA:CZYSAYTK and external hemorrhoids; colonic diverticulosis  . COLONOSCOPY WITH PROPOFOL N/A 02/26/2019   two simple adenomas, diverticulosis,  and internal hemorrhoids.   . ESOPHAGEAL DILATION     "more than 3 times" (01/19/2014)  . ESOPHAGOGASTRODUODENOSCOPY  02/08/11   [place]-Distal esophageal erosion consistent with mild erosive reflux   esophagitis/ Noncritical Schatzki ring, small hiatal hernia otherwise upper/ gastrointestinal tract appeared unremarkable, status post passage  of a Maloney dilation to biopsy disruption of the ring  described  . ESOPHAGOGASTRODUODENOSCOPY  04/2012   2 tandem incomplete distal esophagea rings s/p dilation.   . ESOPHAGOGASTRODUODENOSCOPY N/A 09/16/2014   Dr. Gala Romney: Schatzki ring status post dilation/disruption.  Hiatal hernia.  Marland Kitchen ESOPHAGOGASTRODUODENOSCOPY (EGD) WITH PROPOFOL N/A 12/08/2018   EGD with mild Schatzki ring s/p dilation, small hiatal hernia, otherwise normal  . EYE SURGERY     cataracts, bilateral  . FLEXIBLE SIGMOIDOSCOPY  08/18/2020   Procedure: FLEXIBLE SIGMOIDOSCOPY;  Surgeon: Eloise Harman, DO;  [facility];  Service: Endoscopy;;  . GIVENS CAPSULE STUDY N/A 09/29/2019   Procedure: GIVENS CAPSULE STUDY;  Surgeon: Daneil Dolin, MD;  [facility];  Service: Endoscopy;  Laterality: N/A;  7:30am  . INCONTINENCE SURGERY  08/26/09   [place]  . JOINT REPLACEMENT     right total knee  . MALONEY DILATION N/A 09/16/2014   Procedure: Venia Minks DILATION;  Surgeon: Daneil Dolin, MD;  [facility];  Service: Endoscopy;  Laterality: N/A;  . Venia Minks DILATION N/A 12/08/2018   Procedure: Venia Minks DILATION;  Surgeon: Daneil Dolin, MD;  [facility];  Service: Endoscopy;  Laterality: N/A;  . PARS PLANA VITRECTOMY W/ REPAIR OF MACULAR HOLE Left 01/19/2014  . PARTIAL COLECTOMY N/A 08/07/2020   Procedure: Left Hemicolectomy;  Surgeon: Aviva Signs, MD;  [facility];  Service: General;  Laterality: N/A;  . POLYPECTOMY  02/26/2019   Procedure: POLYPECTOMY;  Surgeon: Danie Binder, MD;  [facility];  Service: Endoscopy;;  ascending colon polyps x2  . SAVORY DILATION N/A 09/16/2014   Procedure: SAVORY DILATION;  Surgeon: Daneil Dolin, MD;  [facility];  Service: Endoscopy;  Laterality: N/A;  . TONSILLECTOMY    . TOTAL KNEE ARTHROPLASTY Right 05/13/07   Dr. Aline Brochure  . TRIGGER FINGER RELEASE Right 06/26/2018   Procedure: RIGHT INDEX FINGER TRIGGER FINGER/A-1 PULLEY RELEASE;  Surgeon: Carole Civil, MD;   Location[facility];  Service: Orthopedics;  Laterality: Right;   Social History   Socioeconomic History  . Marital status: Single    Spouse name: Not on file  . Number of children: 2  . Years of education: Not on file  . Highest education level: Not on file  Occupational History  . Occupation: disabled    Employer: RETIRED  Tobacco Use  . Smoking status: Former Smoker    Packs/day: 0.50    Years: 25.00    Pack years: 12.50    Types: Cigarettes    Quit date: 01/01/2004    Years since quitting: 16.6  . Smokeless tobacco: Never Used  Vaping Use  . Vaping Use: Never used  Substance and Sexual Activity  . Alcohol use: No  . Drug use: No  . Sexual activity: Never    Birth control/protection: Surgical  Other Topics Concern  . Not on file  Social History Narrative   Patient lives at  home by herself   Patient drinks one cup of coffee a day   Patient is right handed.   Social Determinants of Health   Financial Resource Strain:   . Difficulty of Paying Living Expenses: Not on file  Food Insecurity:   . Worried About Charity fundraiser in the Last Year: Not on file  . Ran Out of Food in the Last Year: Not on file  Transportation Needs:   . Lack of Transportation (Medical): Not on file  . Lack of Transportation (Non-Medical): Not on file  Physical Activity:   . Days of Exercise per Week: Not on file  . Minutes of Exercise per Session: Not on file  Stress:   . Feeling of Stress : Not on file  Social Connections:   . Frequency of Communication with Friends and Family: Not on file  . Frequency of Social Gatherings with Friends and Family: Not on file  . Attends Religious Services: Not on file  . Active Member of Clubs or Organizations: Not on file  . Attends Archivist Meetings: Not on file  . Marital Status: Not on file  Intimate Partner Violence:   . Fear of Current or Ex-Partner: Not on file  . Emotionally Abused: Not on file  . Physically Abused: Not on file   . Sexually Abused: Not on file   No Known Allergies  Last set of Vital Signs (not current) Vitals:   08/22/20 0424 08/22/20 1251  BP: (!) 157/70 (!) 143/80  Pulse: 72 99  Resp:  18  Temp: 98.6 F (37 C) 98.3 F (36.8 C)  SpO2: 100% 100%      Physical Exam Gen: unresponsive Cardiovascular: pulseless  Resp: apneic. Breath sounds equal bilaterally with bagging  Abd: nondistended  Neuro: GCS 3, unresponsive to pain  HEENT:  gag reflex absent; fecal matter noted in posterior oropharynx Neck: No crepitus  Musculoskeletal: No deformity  Skin: warm  Procedures  INTUBATION Performed by: Orpah Greek Required items: required blood products, implants, devices, and special equipment available Patient identity confirmed: provided demographic data and hospital-assigned identification number Time out: Immediately prior to procedure a "time out" was called to verify the correct patient, procedure, equipment, support staff and site/side marked as required. Indications: cardiac arrest Intubation method: glidescope Preoxygenation: BVM Sedatives: none Paralytic: none Tube Size: 7.0 cuffed Post-procedure assessment: chest rise and ETCO2 monitor Breath sounds: equal and absent over the epigastrium Tube secured by Respiratory Therapy Patient tolerated the procedure well with no immediate complications.  CRITICAL CARE Performed by: Orpah Greek Total critical care time: 30 Critical care time was exclusive of separately billable procedures and treating other patients. Critical care was necessary to treat or prevent imminent or life-threatening deterioration. Critical care was time spent personally by me on the following activities: development of treatment plan with patient and/or surrogate as well as nursing, discussions with consultants, evaluation of patient's response to treatment, examination of patient, obtaining history from patient or surrogate, ordering and  performing treatments and interventions, ordering and review of laboratory studies, ordering and review of radiographic studies, pulse oximetry and re-evaluation of patient's condition.  Cardiopulmonary Resuscitation (CPR) Procedure Note  Directed/Performed by: Orpah Greek I personally directed ancillary staff and/or performed CPR in an effort to regain return of spontaneous circulation and to maintain cardiac, neuro and systemic perfusion.    Medical Decision making  Patient status post hemicolectomy with known bowel obstruction.  Patient pulseless upon my arrival.  Nurses had  initiated CPR.  Patient administered 4 rounds of epi.  Definitive airway established with endotracheal tube.  Multiple pulse checks revealed asystole throughout code.  With no response to all measures, patient declared dead.  Family present at the bedside.      Orpah Greek, MD 09-14-20 [phone]

## 2020-09-30 NOTE — Progress Notes (Signed)
All lines, tubes and drains removed. Body preparation complete. Pt transported to the morgue. Pt placement notified.

## 2020-09-30 DEATH — deceased

## 2020-10-20 ENCOUNTER — Ambulatory Visit: Payer: Medicare Other | Admitting: Cardiology

## 2021-03-05 IMAGING — CT CT ABD-PELV W/O CM
2 of 4 series · 12 of 46 positions shown, 14 images · non-contrast
Comparison: 08/12/2020
COMPARISON: 08/12/2020

Addendum:
CLINICAL DATA: Elevated white blood cell count with increased
weakness

EXAM:
CT ABDOMEN AND PELVIS WITHOUT CONTRAST
TECHNIQUE: Multidetector CT imaging of the abdomen and pelvis was performed
following the standard protocol without IV contrast.

[Series 2: axial st · axial · 0.83mm/px · z∈[+847,+1292]mm · 9 of 101 slices shown, 11 images]
[im 6/101  soft-tissue]
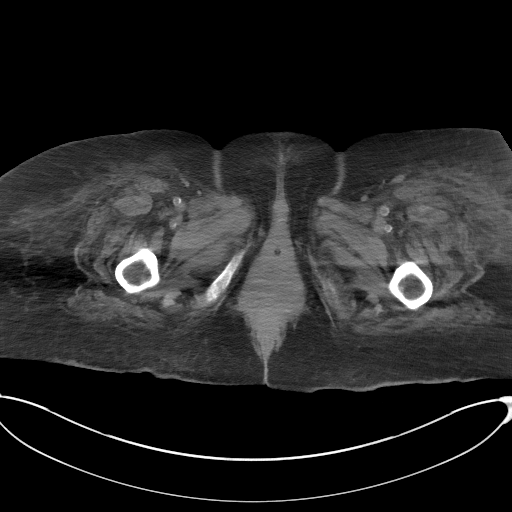
[im 6/101  bone]
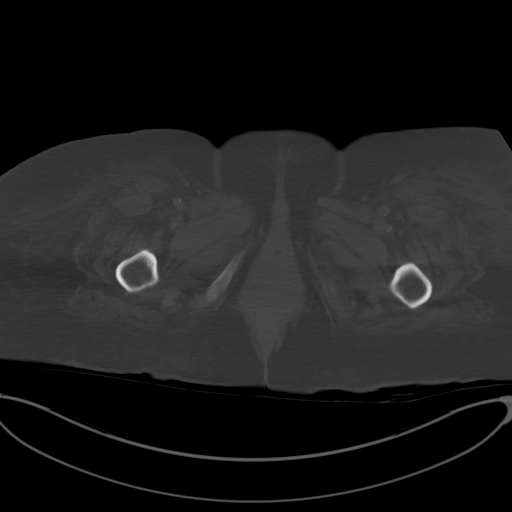
[im 18/101  soft-tissue]
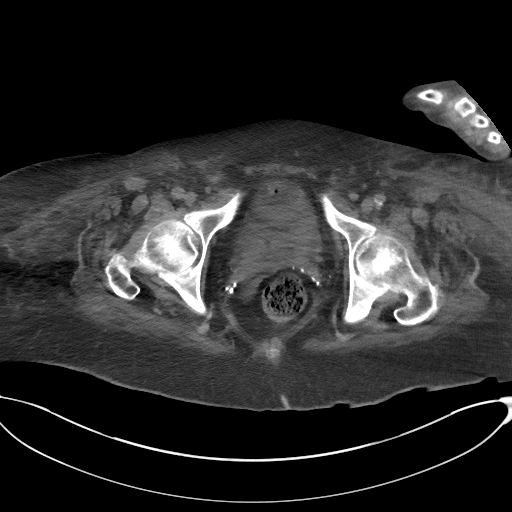
[im 30/101  soft-tissue]
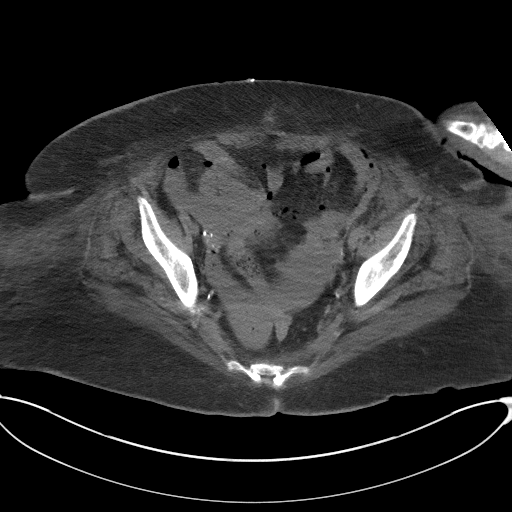
[im 42/101  soft-tissue]
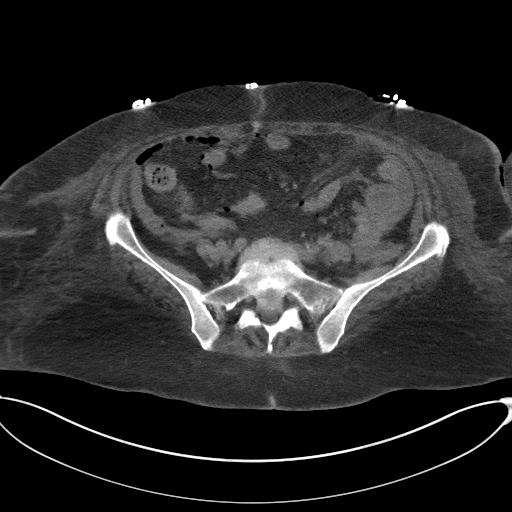
[im 53/101  soft-tissue]
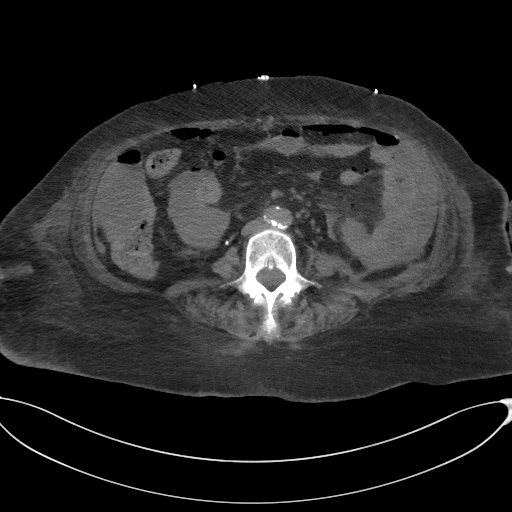
[im 59/101  soft-tissue]
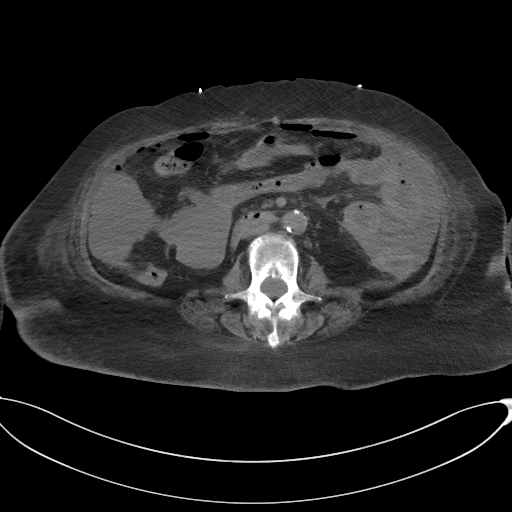
[im 71/101  soft-tissue]
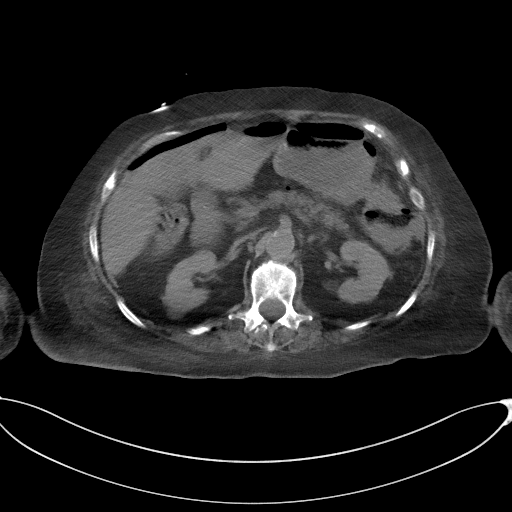
[im 83/101  soft-tissue]
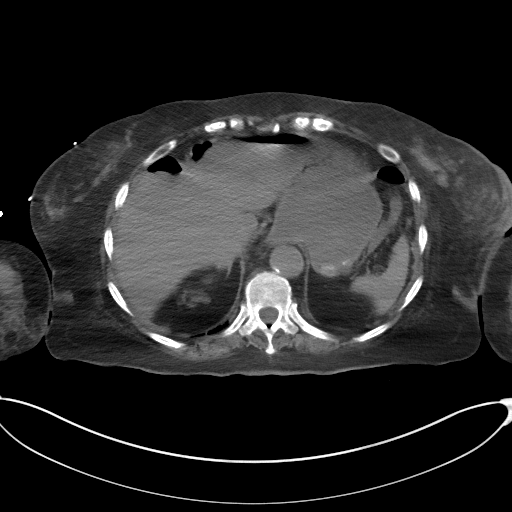
[im 95/101  soft-tissue]
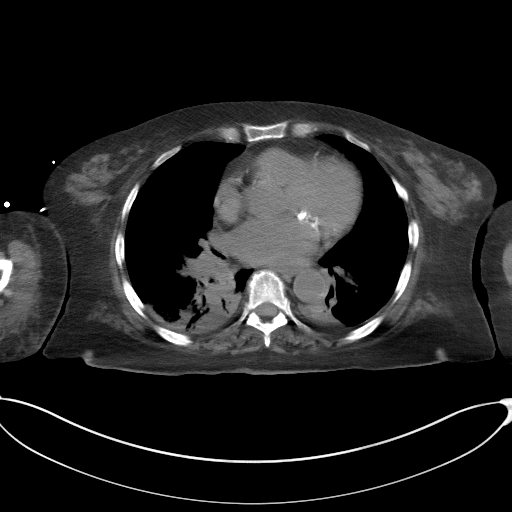
[im 95/101  bone]
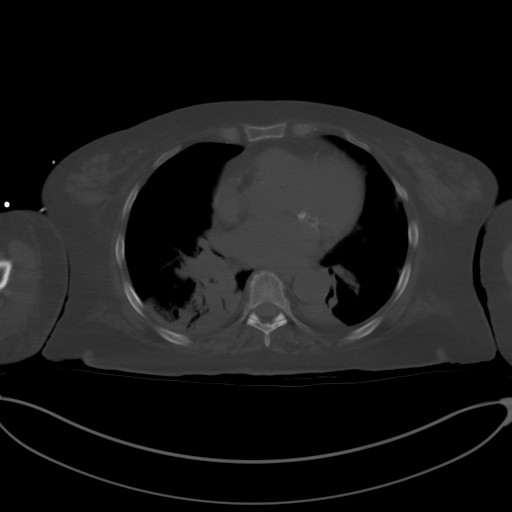

[Series 5: coronal st · coronal · 0.82mm/px · 3 of 100 slices shown]
[im 34/100  soft-tissue]
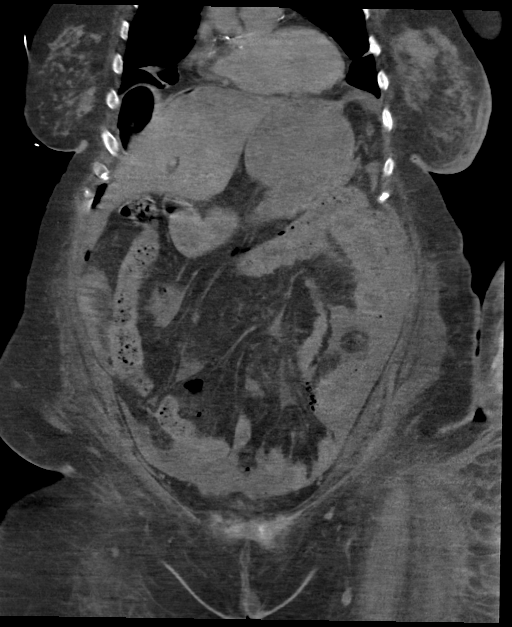
[im 45/100  soft-tissue]
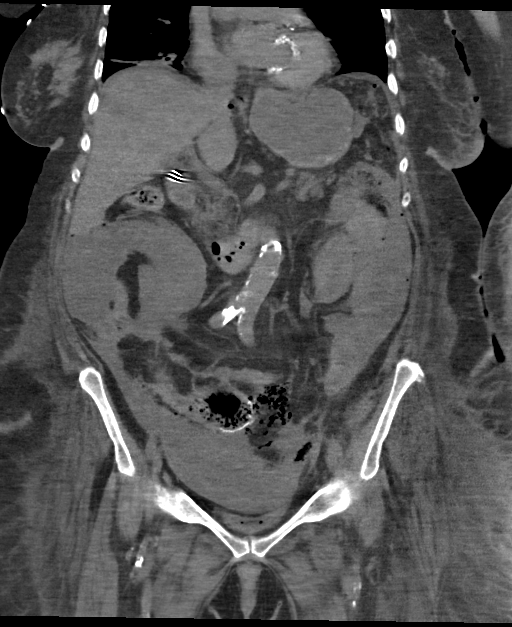
[im 56/100  soft-tissue]
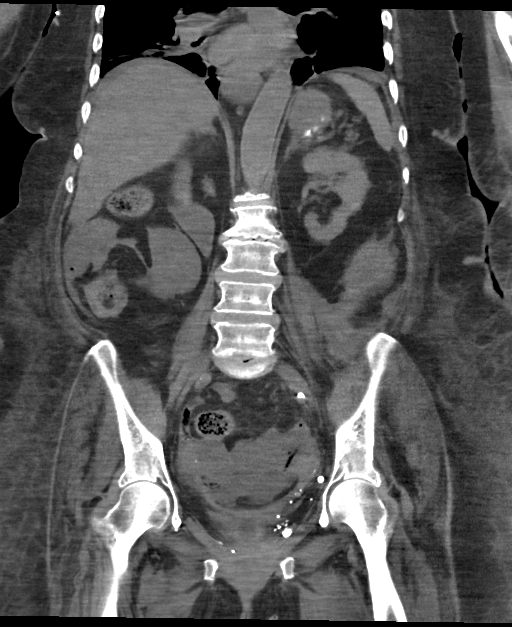

[12 of 46 positions shown; findings below may reference images not displayed]

FINDINGS: Lower chest: Bilateral lower lobe consolidation is noted consistent
with multifocal pneumonia. No associated effusion is seen.

Hepatobiliary: No focal liver abnormality is seen. Status post
cholecystectomy. No biliary dilatation.

Pancreas: Unremarkable. No pancreatic ductal dilatation or
surrounding inflammatory changes.

Spleen: Normal in size without focal abnormality.

Adrenals/Urinary Tract: Kidneys are well visualized with stable
cysts bilaterally. No obstructive changes are seen. Calcification is
noted within the cortex on the right stable from the prior exam. The
bladder is decompressed by Foley catheter.

Stomach/Bowel: There are changes consistent with the recent partial
colectomy with an anastomotic site identified in the pelvis joining
the transverse and sigmoid colons. Adjacent to this best seen on
image number 67 of series 2 is a irregular air collection identified
with findings suspicious of breakdown of the sigmoid at the area of
prior colectomy. The air collection measures approximately 6.7 x
cm in greatest transverse and AP dimensions. A considerable amount
of free air is noted within the abdomen although some of this may be
related to the recent surgery. The stomach is well distended with
fluid and there is evidence of gastroesophageal reflux. Additionally
some the small-bowel loops are mildly prominent likely related to
irritation from the changes in the pelvis. Free fluid with air
within is noted within the pelvis as well. This is felt to be
greater than that expected for the postoperative state and given the
areas of air within is suspicious for spillage of bowel contents.

Vascular/Lymphatic: Aortic atherosclerosis. No enlarged abdominal or
pelvic lymph nodes.

Reproductive: Status post hysterectomy. No adnexal masses.

Other: Considerable free fluid is noted within the pelvis with
multiple flecks of air within suspicious for spillage of bowel
contents.

Musculoskeletal: No acute bony abnormality is noted.
IMPRESSION: Changes consistent with the known side to side transverse colon to
sigmoid colon anastomosis and left colectomy are seen. At the level
of the sigmoid stump there are findings suspicious for breakdown of
the staple line with an irregular air collection adjacent to the
sigmoid stump as well as a considerable amount of free fluid and
free air within the abdomen greater than that expected for the
postoperative state. This is suspicious for breakdown of the staple
line and leakage of fecal material into the abdomen.

Bilateral lower lobe consolidation consistent with multifocal
pneumonia.

Some mildly prominent loops of small bowel are noted which may be
reactive from the fluid and air within the abdomen.

Critical Value/emergent results were called by telephone at the time
of interpretation on 09/07/2020 at [DATE] to Lorraine, the pts nurse
who will relay the report to Dr Addis.

ADDENDUM:
These findings were also communicated to Dr. Addis at 7044 hours
on 09/07/2020

*** End of Addendum ***
FINDINGS: Lower chest: Bilateral lower lobe consolidation is noted consistent
with multifocal pneumonia. No associated effusion is seen.

Hepatobiliary: No focal liver abnormality is seen. Status post
cholecystectomy. No biliary dilatation.

Pancreas: Unremarkable. No pancreatic ductal dilatation or
surrounding inflammatory changes.

Spleen: Normal in size without focal abnormality.

Adrenals/Urinary Tract: Kidneys are well visualized with stable
cysts bilaterally. No obstructive changes are seen. Calcification is
noted within the cortex on the right stable from the prior exam. The
bladder is decompressed by Foley catheter.

Stomach/Bowel: There are changes consistent with the recent partial
colectomy with an anastomotic site identified in the pelvis joining
the transverse and sigmoid colons. Adjacent to this best seen on
image number 67 of series 2 is a irregular air collection identified
with findings suspicious of breakdown of the sigmoid at the area of
prior colectomy. The air collection measures approximately 6.7 x
cm in greatest transverse and AP dimensions. A considerable amount
of free air is noted within the abdomen although some of this may be
related to the recent surgery. The stomach is well distended with
fluid and there is evidence of gastroesophageal reflux. Additionally
some the small-bowel loops are mildly prominent likely related to
irritation from the changes in the pelvis. Free fluid with air
within is noted within the pelvis as well. This is felt to be
greater than that expected for the postoperative state and given the
areas of air within is suspicious for spillage of bowel contents.

Vascular/Lymphatic: Aortic atherosclerosis. No enlarged abdominal or
pelvic lymph nodes.

Reproductive: Status post hysterectomy. No adnexal masses.

Other: Considerable free fluid is noted within the pelvis with
multiple flecks of air within suspicious for spillage of bowel
contents.

Musculoskeletal: No acute bony abnormality is noted.
IMPRESSION: Changes consistent with the known side to side transverse colon to
sigmoid colon anastomosis and left colectomy are seen. At the level
of the sigmoid stump there are findings suspicious for breakdown of
the staple line with an irregular air collection adjacent to the
sigmoid stump as well as a considerable amount of free fluid and
free air within the abdomen greater than that expected for the
postoperative state. This is suspicious for breakdown of the staple
line and leakage of fecal material into the abdomen.

Bilateral lower lobe consolidation consistent with multifocal
pneumonia.

Some mildly prominent loops of small bowel are noted which may be
reactive from the fluid and air within the abdomen.

Critical Value/emergent results were called by telephone at the time
of interpretation on 09/07/2020 at [DATE] to Lorraine, the pts nurse
who will relay the report to Dr Addis.
